# Patient Record
Sex: Female | Born: 1950
Health system: Southern US, Community
[De-identification: ages and names within clinical notes are randomized; demographics above are authoritative.]

## PROBLEM LIST (undated history)

## (undated) DIAGNOSIS — E119 Type 2 diabetes mellitus without complications: Secondary | ICD-10-CM

## (undated) DIAGNOSIS — M549 Dorsalgia, unspecified: Secondary | ICD-10-CM

## (undated) DIAGNOSIS — R609 Edema, unspecified: Secondary | ICD-10-CM

## (undated) DIAGNOSIS — K52832 Lymphocytic colitis: Secondary | ICD-10-CM

## (undated) DIAGNOSIS — R112 Nausea with vomiting, unspecified: Secondary | ICD-10-CM

## (undated) DIAGNOSIS — U071 COVID-19: Secondary | ICD-10-CM

## (undated) DIAGNOSIS — IMO0002 Reserved for concepts with insufficient information to code with codable children: Secondary | ICD-10-CM

## (undated) DIAGNOSIS — R3915 Urgency of urination: Secondary | ICD-10-CM

## (undated) DIAGNOSIS — K219 Gastro-esophageal reflux disease without esophagitis: Secondary | ICD-10-CM

## (undated) DIAGNOSIS — E785 Hyperlipidemia, unspecified: Secondary | ICD-10-CM

## (undated) DIAGNOSIS — R748 Abnormal levels of other serum enzymes: Secondary | ICD-10-CM

## (undated) DIAGNOSIS — R600 Localized edema: Secondary | ICD-10-CM

## (undated) DIAGNOSIS — Z9289 Personal history of other medical treatment: Secondary | ICD-10-CM

## (undated) DIAGNOSIS — J45909 Unspecified asthma, uncomplicated: Secondary | ICD-10-CM

## (undated) DIAGNOSIS — H269 Unspecified cataract: Secondary | ICD-10-CM

## (undated) DIAGNOSIS — N393 Stress incontinence (female) (male): Secondary | ICD-10-CM

## (undated) DIAGNOSIS — K224 Dyskinesia of esophagus: Secondary | ICD-10-CM

## (undated) DIAGNOSIS — I7300 Raynaud's syndrome without gangrene: Secondary | ICD-10-CM

## (undated) DIAGNOSIS — I10 Essential (primary) hypertension: Secondary | ICD-10-CM

## (undated) DIAGNOSIS — J302 Other seasonal allergic rhinitis: Secondary | ICD-10-CM

## (undated) DIAGNOSIS — G629 Polyneuropathy, unspecified: Secondary | ICD-10-CM

## (undated) DIAGNOSIS — Z8709 Personal history of other diseases of the respiratory system: Secondary | ICD-10-CM

## (undated) DIAGNOSIS — G8929 Other chronic pain: Secondary | ICD-10-CM

## (undated) DIAGNOSIS — L4050 Arthropathic psoriasis, unspecified: Secondary | ICD-10-CM

## (undated) DIAGNOSIS — Z8719 Personal history of other diseases of the digestive system: Secondary | ICD-10-CM

## (undated) DIAGNOSIS — Z9889 Other specified postprocedural states: Secondary | ICD-10-CM

## (undated) DIAGNOSIS — M797 Fibromyalgia: Secondary | ICD-10-CM

## (undated) DIAGNOSIS — J310 Chronic rhinitis: Secondary | ICD-10-CM

## (undated) HISTORY — DX: Arthropathic psoriasis, unspecified: L40.50

## (undated) HISTORY — PX: LUMBAR FUSION: SHX111

## (undated) HISTORY — DX: Raynaud's syndrome without gangrene: I73.00

## (undated) HISTORY — DX: Reserved for concepts with insufficient information to code with codable children: IMO0002

## (undated) HISTORY — DX: Essential (primary) hypertension: I10

## (undated) HISTORY — DX: Other seasonal allergic rhinitis: J30.2

## (undated) HISTORY — DX: Lymphocytic colitis: K52.832

## (undated) HISTORY — DX: COVID-19: U07.1

## (undated) HISTORY — DX: Dyskinesia of esophagus: K22.4

## (undated) HISTORY — PX: TONSILLECTOMY AND ADENOIDECTOMY: SHX28

## (undated) HISTORY — DX: Stress incontinence (female) (male): N39.3

## (undated) HISTORY — PX: TONSILLECTOMY: SUR1361

## (undated) HISTORY — DX: Chronic rhinitis: J31.0

## (undated) HISTORY — PX: TUBAL LIGATION: SHX77

## (undated) HISTORY — DX: Gastro-esophageal reflux disease without esophagitis: K21.9

---

## 1975-04-03 HISTORY — PX: DILATION AND CURETTAGE OF UTERUS: SHX78

## 1996-09-30 HISTORY — PX: LUMBAR DISC ARTHROPLASTY: SHX699

## 1997-08-13 ENCOUNTER — Ambulatory Visit (HOSPITAL_COMMUNITY): Admission: RE | Admit: 1997-08-13 | Discharge: 1997-08-13 | Payer: Self-pay | Admitting: Neurosurgery

## 1999-02-15 ENCOUNTER — Other Ambulatory Visit: Admission: RE | Admit: 1999-02-15 | Discharge: 1999-02-15 | Payer: Self-pay | Admitting: *Deleted

## 1999-02-15 ENCOUNTER — Encounter (INDEPENDENT_AMBULATORY_CARE_PROVIDER_SITE_OTHER): Payer: Self-pay

## 1999-08-23 ENCOUNTER — Other Ambulatory Visit: Admission: RE | Admit: 1999-08-23 | Discharge: 1999-08-23 | Payer: Self-pay | Admitting: *Deleted

## 2000-10-17 ENCOUNTER — Ambulatory Visit (HOSPITAL_COMMUNITY): Admission: RE | Admit: 2000-10-17 | Discharge: 2000-10-17 | Payer: Self-pay | Admitting: Neurosurgery

## 2000-10-17 ENCOUNTER — Encounter: Payer: Self-pay | Admitting: Neurosurgery

## 2000-10-24 ENCOUNTER — Encounter: Admission: RE | Admit: 2000-10-24 | Discharge: 2000-11-30 | Payer: Self-pay | Admitting: Anesthesiology

## 2001-01-30 ENCOUNTER — Ambulatory Visit (HOSPITAL_COMMUNITY): Admission: RE | Admit: 2001-01-30 | Discharge: 2001-01-30 | Payer: Self-pay | Admitting: *Deleted

## 2001-01-30 ENCOUNTER — Encounter: Payer: Self-pay | Admitting: *Deleted

## 2001-04-02 HISTORY — PX: BACK SURGERY: SHX140

## 2001-05-06 ENCOUNTER — Ambulatory Visit (HOSPITAL_COMMUNITY): Admission: RE | Admit: 2001-05-06 | Discharge: 2001-05-06 | Payer: Self-pay | Admitting: Orthopedic Surgery

## 2001-05-06 ENCOUNTER — Encounter: Payer: Self-pay | Admitting: Orthopedic Surgery

## 2001-06-13 ENCOUNTER — Encounter: Payer: Self-pay | Admitting: Neurosurgery

## 2001-06-13 ENCOUNTER — Encounter: Admission: RE | Admit: 2001-06-13 | Discharge: 2001-06-13 | Payer: Self-pay | Admitting: Neurosurgery

## 2001-06-27 ENCOUNTER — Encounter: Admission: RE | Admit: 2001-06-27 | Discharge: 2001-06-27 | Payer: Self-pay | Admitting: Neurosurgery

## 2001-06-27 ENCOUNTER — Encounter: Payer: Self-pay | Admitting: Neurosurgery

## 2001-07-11 ENCOUNTER — Encounter: Admission: RE | Admit: 2001-07-11 | Discharge: 2001-07-11 | Payer: Self-pay | Admitting: Neurosurgery

## 2001-07-11 ENCOUNTER — Encounter: Payer: Self-pay | Admitting: Neurosurgery

## 2001-08-01 ENCOUNTER — Encounter: Payer: Self-pay | Admitting: Neurosurgery

## 2001-08-01 ENCOUNTER — Ambulatory Visit (HOSPITAL_COMMUNITY): Admission: RE | Admit: 2001-08-01 | Discharge: 2001-08-01 | Payer: Self-pay | Admitting: Neurosurgery

## 2001-08-27 ENCOUNTER — Other Ambulatory Visit: Admission: RE | Admit: 2001-08-27 | Discharge: 2001-08-27 | Payer: Self-pay | Admitting: *Deleted

## 2001-08-29 ENCOUNTER — Encounter: Payer: Self-pay | Admitting: Neurosurgery

## 2001-08-29 ENCOUNTER — Inpatient Hospital Stay (HOSPITAL_COMMUNITY): Admission: RE | Admit: 2001-08-29 | Discharge: 2001-09-02 | Payer: Self-pay | Admitting: Neurosurgery

## 2002-06-02 ENCOUNTER — Ambulatory Visit (HOSPITAL_COMMUNITY): Admission: RE | Admit: 2002-06-02 | Discharge: 2002-06-02 | Payer: Self-pay | Admitting: *Deleted

## 2002-06-02 ENCOUNTER — Encounter: Payer: Self-pay | Admitting: *Deleted

## 2002-08-14 ENCOUNTER — Encounter: Admission: RE | Admit: 2002-08-14 | Discharge: 2002-08-14 | Payer: Self-pay | Admitting: Neurosurgery

## 2002-08-14 ENCOUNTER — Encounter: Payer: Self-pay | Admitting: Neurosurgery

## 2002-08-14 ENCOUNTER — Encounter: Payer: Self-pay | Admitting: Radiology

## 2002-08-28 ENCOUNTER — Encounter: Payer: Self-pay | Admitting: Neurosurgery

## 2002-08-28 ENCOUNTER — Encounter: Admission: RE | Admit: 2002-08-28 | Discharge: 2002-08-28 | Payer: Self-pay | Admitting: Neurosurgery

## 2002-09-03 ENCOUNTER — Other Ambulatory Visit: Admission: RE | Admit: 2002-09-03 | Discharge: 2002-09-03 | Payer: Self-pay | Admitting: *Deleted

## 2002-09-15 ENCOUNTER — Ambulatory Visit (HOSPITAL_COMMUNITY): Admission: AD | Admit: 2002-09-15 | Discharge: 2002-09-15 | Payer: Self-pay | Admitting: Neurosurgery

## 2002-09-15 ENCOUNTER — Encounter: Payer: Self-pay | Admitting: Neurosurgery

## 2002-10-08 ENCOUNTER — Encounter: Admission: RE | Admit: 2002-10-08 | Discharge: 2002-10-08 | Payer: Self-pay | Admitting: Orthopedic Surgery

## 2002-10-08 ENCOUNTER — Encounter: Payer: Self-pay | Admitting: Orthopedic Surgery

## 2002-12-15 ENCOUNTER — Encounter
Admission: RE | Admit: 2002-12-15 | Discharge: 2003-03-15 | Payer: Self-pay | Admitting: Physical Medicine & Rehabilitation

## 2002-12-24 ENCOUNTER — Encounter
Admission: RE | Admit: 2002-12-24 | Discharge: 2003-03-24 | Payer: Self-pay | Admitting: Physical Medicine & Rehabilitation

## 2003-04-23 ENCOUNTER — Encounter
Admission: RE | Admit: 2003-04-23 | Discharge: 2003-07-22 | Payer: Self-pay | Admitting: Physical Medicine & Rehabilitation

## 2003-06-18 ENCOUNTER — Ambulatory Visit (HOSPITAL_COMMUNITY): Admission: RE | Admit: 2003-06-18 | Discharge: 2003-06-18 | Payer: Self-pay | Admitting: *Deleted

## 2003-09-09 ENCOUNTER — Other Ambulatory Visit: Admission: RE | Admit: 2003-09-09 | Discharge: 2003-09-09 | Payer: Self-pay | Admitting: *Deleted

## 2003-11-11 ENCOUNTER — Encounter
Admission: RE | Admit: 2003-11-11 | Discharge: 2003-12-20 | Payer: Self-pay | Admitting: Physical Medicine & Rehabilitation

## 2003-12-20 ENCOUNTER — Encounter
Admission: RE | Admit: 2003-12-20 | Discharge: 2004-02-18 | Payer: Self-pay | Admitting: Physical Medicine & Rehabilitation

## 2003-12-21 ENCOUNTER — Ambulatory Visit: Payer: Self-pay | Admitting: Physical Medicine & Rehabilitation

## 2003-12-25 ENCOUNTER — Ambulatory Visit (HOSPITAL_COMMUNITY): Admission: RE | Admit: 2003-12-25 | Discharge: 2003-12-25 | Payer: Self-pay | Admitting: Rheumatology

## 2004-03-29 ENCOUNTER — Encounter
Admission: RE | Admit: 2004-03-29 | Discharge: 2004-06-27 | Payer: Self-pay | Admitting: Physical Medicine & Rehabilitation

## 2004-09-05 ENCOUNTER — Ambulatory Visit (HOSPITAL_COMMUNITY): Admission: RE | Admit: 2004-09-05 | Discharge: 2004-09-05 | Payer: Self-pay | Admitting: *Deleted

## 2004-09-14 ENCOUNTER — Encounter: Admission: RE | Admit: 2004-09-14 | Discharge: 2004-09-14 | Payer: Self-pay | Admitting: Obstetrics and Gynecology

## 2004-12-01 ENCOUNTER — Encounter: Admission: RE | Admit: 2004-12-01 | Discharge: 2004-12-01 | Payer: Self-pay | Admitting: *Deleted

## 2005-02-26 ENCOUNTER — Ambulatory Visit (HOSPITAL_COMMUNITY): Admission: RE | Admit: 2005-02-26 | Discharge: 2005-02-26 | Payer: Self-pay | Admitting: Internal Medicine

## 2005-03-07 ENCOUNTER — Ambulatory Visit (HOSPITAL_COMMUNITY): Admission: RE | Admit: 2005-03-07 | Discharge: 2005-03-07 | Payer: Self-pay | Admitting: *Deleted

## 2005-04-04 ENCOUNTER — Ambulatory Visit (HOSPITAL_COMMUNITY): Admission: RE | Admit: 2005-04-04 | Discharge: 2005-04-04 | Payer: Self-pay | Admitting: *Deleted

## 2005-09-05 ENCOUNTER — Ambulatory Visit (HOSPITAL_COMMUNITY): Admission: RE | Admit: 2005-09-05 | Discharge: 2005-09-05 | Payer: Self-pay | Admitting: General Surgery

## 2005-10-05 ENCOUNTER — Other Ambulatory Visit: Admission: RE | Admit: 2005-10-05 | Discharge: 2005-10-05 | Payer: Self-pay | Admitting: Obstetrics & Gynecology

## 2005-12-13 ENCOUNTER — Ambulatory Visit (HOSPITAL_COMMUNITY): Admission: RE | Admit: 2005-12-13 | Discharge: 2005-12-13 | Payer: Self-pay | Admitting: Neurosurgery

## 2005-12-31 ENCOUNTER — Encounter: Admission: RE | Admit: 2005-12-31 | Discharge: 2005-12-31 | Payer: Self-pay | Admitting: Neurosurgery

## 2006-01-21 ENCOUNTER — Encounter: Admission: RE | Admit: 2006-01-21 | Discharge: 2006-01-21 | Payer: Self-pay | Admitting: Neurosurgery

## 2006-03-13 ENCOUNTER — Ambulatory Visit (HOSPITAL_COMMUNITY): Admission: RE | Admit: 2006-03-13 | Discharge: 2006-03-13 | Payer: Self-pay | Admitting: *Deleted

## 2006-05-01 ENCOUNTER — Encounter: Admission: RE | Admit: 2006-05-01 | Discharge: 2006-05-01 | Payer: Self-pay | Admitting: Neurosurgery

## 2006-10-16 ENCOUNTER — Encounter: Admission: RE | Admit: 2006-10-16 | Discharge: 2006-10-16 | Payer: Self-pay | Admitting: Obstetrics & Gynecology

## 2006-10-30 ENCOUNTER — Other Ambulatory Visit: Admission: RE | Admit: 2006-10-30 | Discharge: 2006-10-30 | Payer: Self-pay | Admitting: Obstetrics & Gynecology

## 2006-11-22 ENCOUNTER — Encounter: Admission: RE | Admit: 2006-11-22 | Discharge: 2006-11-22 | Payer: Self-pay | Admitting: Rheumatology

## 2006-12-27 ENCOUNTER — Ambulatory Visit (HOSPITAL_COMMUNITY): Admission: RE | Admit: 2006-12-27 | Discharge: 2006-12-27 | Payer: Self-pay | Admitting: Neurosurgery

## 2007-01-15 ENCOUNTER — Ambulatory Visit (HOSPITAL_COMMUNITY): Payer: Self-pay | Admitting: Internal Medicine

## 2007-01-15 ENCOUNTER — Encounter (HOSPITAL_COMMUNITY): Admission: RE | Admit: 2007-01-15 | Discharge: 2007-02-14 | Payer: Self-pay | Admitting: Oncology

## 2007-02-05 ENCOUNTER — Inpatient Hospital Stay (HOSPITAL_COMMUNITY): Admission: RE | Admit: 2007-02-05 | Discharge: 2007-02-07 | Payer: Self-pay | Admitting: Neurosurgery

## 2007-03-10 ENCOUNTER — Ambulatory Visit (HOSPITAL_COMMUNITY): Admission: RE | Admit: 2007-03-10 | Discharge: 2007-03-10 | Payer: Self-pay | Admitting: Obstetrics & Gynecology

## 2007-04-16 ENCOUNTER — Encounter (HOSPITAL_COMMUNITY): Admission: RE | Admit: 2007-04-16 | Discharge: 2007-05-16 | Payer: Self-pay | Admitting: Internal Medicine

## 2007-04-16 ENCOUNTER — Ambulatory Visit (HOSPITAL_COMMUNITY): Payer: Self-pay | Admitting: Internal Medicine

## 2007-04-25 ENCOUNTER — Ambulatory Visit (HOSPITAL_COMMUNITY): Admission: RE | Admit: 2007-04-25 | Discharge: 2007-04-25 | Payer: Self-pay | Admitting: Internal Medicine

## 2007-07-16 ENCOUNTER — Encounter (HOSPITAL_COMMUNITY): Admission: RE | Admit: 2007-07-16 | Discharge: 2007-08-15 | Payer: Self-pay | Admitting: Internal Medicine

## 2007-07-16 ENCOUNTER — Ambulatory Visit (HOSPITAL_COMMUNITY): Payer: Self-pay | Admitting: Internal Medicine

## 2007-10-15 ENCOUNTER — Encounter (HOSPITAL_COMMUNITY): Admission: RE | Admit: 2007-10-15 | Discharge: 2007-11-14 | Payer: Self-pay | Admitting: Internal Medicine

## 2007-10-15 ENCOUNTER — Ambulatory Visit (HOSPITAL_COMMUNITY): Payer: Self-pay | Admitting: Internal Medicine

## 2007-11-06 ENCOUNTER — Other Ambulatory Visit: Admission: RE | Admit: 2007-11-06 | Discharge: 2007-11-06 | Payer: Self-pay | Admitting: Obstetrics & Gynecology

## 2008-01-14 ENCOUNTER — Ambulatory Visit (HOSPITAL_COMMUNITY): Payer: Self-pay | Admitting: Endocrinology

## 2008-01-14 ENCOUNTER — Encounter (HOSPITAL_COMMUNITY): Admission: RE | Admit: 2008-01-14 | Discharge: 2008-02-13 | Payer: Self-pay | Admitting: Endocrinology

## 2008-03-15 ENCOUNTER — Ambulatory Visit (HOSPITAL_COMMUNITY): Admission: RE | Admit: 2008-03-15 | Discharge: 2008-03-15 | Payer: Self-pay | Admitting: Obstetrics & Gynecology

## 2008-04-20 ENCOUNTER — Encounter (HOSPITAL_COMMUNITY): Admission: RE | Admit: 2008-04-20 | Discharge: 2008-05-20 | Payer: Self-pay | Admitting: Endocrinology

## 2008-04-20 ENCOUNTER — Ambulatory Visit (HOSPITAL_COMMUNITY): Payer: Self-pay | Admitting: Endocrinology

## 2008-05-12 ENCOUNTER — Ambulatory Visit: Payer: Self-pay | Admitting: Gastroenterology

## 2008-05-22 ENCOUNTER — Emergency Department (HOSPITAL_COMMUNITY): Admission: EM | Admit: 2008-05-22 | Discharge: 2008-05-22 | Payer: Self-pay | Admitting: Emergency Medicine

## 2008-06-04 ENCOUNTER — Encounter: Payer: Self-pay | Admitting: Gastroenterology

## 2008-06-18 ENCOUNTER — Encounter: Payer: Self-pay | Admitting: Gastroenterology

## 2008-06-18 ENCOUNTER — Ambulatory Visit (HOSPITAL_COMMUNITY): Admission: RE | Admit: 2008-06-18 | Discharge: 2008-06-18 | Payer: Self-pay | Admitting: Gastroenterology

## 2008-06-18 ENCOUNTER — Ambulatory Visit: Payer: Self-pay | Admitting: Gastroenterology

## 2008-06-19 HISTORY — PX: COLONOSCOPY: SHX5424

## 2008-06-19 HISTORY — PX: ESOPHAGOGASTRODUODENOSCOPY: SHX1529

## 2008-06-22 ENCOUNTER — Encounter: Payer: Self-pay | Admitting: Gastroenterology

## 2008-07-09 ENCOUNTER — Encounter: Admission: RE | Admit: 2008-07-09 | Discharge: 2008-07-09 | Payer: Self-pay | Admitting: Neurosurgery

## 2008-07-14 ENCOUNTER — Encounter: Payer: Self-pay | Admitting: Gastroenterology

## 2008-07-29 ENCOUNTER — Encounter (HOSPITAL_COMMUNITY): Admission: RE | Admit: 2008-07-29 | Discharge: 2008-08-28 | Payer: Self-pay | Admitting: Endocrinology

## 2008-08-02 ENCOUNTER — Ambulatory Visit (HOSPITAL_COMMUNITY): Payer: Self-pay | Admitting: Endocrinology

## 2008-10-29 ENCOUNTER — Encounter: Payer: Self-pay | Admitting: Obstetrics & Gynecology

## 2008-10-29 ENCOUNTER — Encounter: Admission: RE | Admit: 2008-10-29 | Discharge: 2008-10-29 | Payer: Self-pay | Admitting: Obstetrics & Gynecology

## 2008-11-08 ENCOUNTER — Ambulatory Visit (HOSPITAL_COMMUNITY): Payer: Self-pay | Admitting: Endocrinology

## 2008-11-08 ENCOUNTER — Encounter (HOSPITAL_COMMUNITY): Admission: RE | Admit: 2008-11-08 | Discharge: 2008-12-08 | Payer: Self-pay | Admitting: Endocrinology

## 2009-02-07 ENCOUNTER — Ambulatory Visit (HOSPITAL_COMMUNITY): Payer: Self-pay | Admitting: Internal Medicine

## 2009-02-07 ENCOUNTER — Encounter (HOSPITAL_COMMUNITY): Admission: RE | Admit: 2009-02-07 | Discharge: 2009-03-09 | Payer: Self-pay | Admitting: Internal Medicine

## 2009-05-09 ENCOUNTER — Encounter (HOSPITAL_COMMUNITY): Admission: RE | Admit: 2009-05-09 | Discharge: 2009-06-08 | Payer: Self-pay | Admitting: Internal Medicine

## 2009-05-09 ENCOUNTER — Ambulatory Visit (HOSPITAL_COMMUNITY): Payer: Self-pay | Admitting: Internal Medicine

## 2009-08-12 ENCOUNTER — Encounter (HOSPITAL_COMMUNITY): Admission: RE | Admit: 2009-08-12 | Discharge: 2009-09-11 | Payer: Self-pay | Admitting: Internal Medicine

## 2009-08-12 ENCOUNTER — Ambulatory Visit (HOSPITAL_COMMUNITY): Payer: Self-pay | Admitting: Internal Medicine

## 2009-11-11 ENCOUNTER — Encounter (HOSPITAL_COMMUNITY): Admission: RE | Admit: 2009-11-11 | Discharge: 2009-12-11 | Payer: Self-pay | Admitting: Internal Medicine

## 2009-11-21 ENCOUNTER — Ambulatory Visit (HOSPITAL_COMMUNITY): Admission: RE | Admit: 2009-11-21 | Discharge: 2009-11-21 | Payer: Self-pay | Admitting: Obstetrics & Gynecology

## 2009-12-30 ENCOUNTER — Encounter (HOSPITAL_COMMUNITY)
Admission: RE | Admit: 2009-12-30 | Discharge: 2010-03-30 | Payer: Self-pay | Source: Home / Self Care | Attending: Rheumatology | Admitting: Rheumatology

## 2010-02-06 ENCOUNTER — Emergency Department (HOSPITAL_COMMUNITY): Admission: EM | Admit: 2010-02-06 | Discharge: 2010-02-06 | Payer: Self-pay | Admitting: Emergency Medicine

## 2010-02-08 ENCOUNTER — Encounter (HOSPITAL_COMMUNITY)
Admission: RE | Admit: 2010-02-08 | Discharge: 2010-03-10 | Payer: Self-pay | Source: Home / Self Care | Attending: Endocrinology | Admitting: Endocrinology

## 2010-02-08 ENCOUNTER — Ambulatory Visit (HOSPITAL_COMMUNITY): Payer: Self-pay | Admitting: Endocrinology

## 2010-04-02 HISTORY — PX: COLONOSCOPY: SHX174

## 2010-04-22 ENCOUNTER — Encounter: Payer: Self-pay | Admitting: Family Medicine

## 2010-04-23 ENCOUNTER — Encounter: Payer: Self-pay | Admitting: *Deleted

## 2010-04-23 ENCOUNTER — Encounter: Payer: Self-pay | Admitting: Obstetrics & Gynecology

## 2010-04-23 ENCOUNTER — Encounter: Payer: Self-pay | Admitting: Neurosurgery

## 2010-04-24 ENCOUNTER — Encounter: Payer: Self-pay | Admitting: Neurosurgery

## 2010-04-24 ENCOUNTER — Encounter: Payer: Self-pay | Admitting: Obstetrics & Gynecology

## 2010-05-03 ENCOUNTER — Ambulatory Visit (HOSPITAL_COMMUNITY): Payer: 59 | Attending: Rheumatology

## 2010-05-03 DIAGNOSIS — K5090 Crohn's disease, unspecified, without complications: Secondary | ICD-10-CM | POA: Insufficient documentation

## 2010-05-04 NOTE — Consult Note (Signed)
Summary: Consultation Report  Consultation Report   Imported By: Diana Eves 06/04/2008 16:20:47  _____________________________________________________________________  External Attachment:    Type:   Image     Comment:   External Document

## 2010-05-04 NOTE — Letter (Signed)
Summary: ORDER FOR EGD  ORDER FOR EGD   Imported By: Diana Eves 06/22/2008 10:18:59  _____________________________________________________________________  External Attachment:    Type:   Image     Comment:   External Document

## 2010-05-04 NOTE — Letter (Signed)
Summary: REFERRAL  REFERRAL   Imported By: Diana Eves 07/14/2008 10:09:15  _____________________________________________________________________  External Attachment:    Type:   Image     Comment:   External Document

## 2010-05-05 ENCOUNTER — Ambulatory Visit (INDEPENDENT_AMBULATORY_CARE_PROVIDER_SITE_OTHER): Payer: Commercial Managed Care - PPO | Admitting: Urgent Care

## 2010-05-05 ENCOUNTER — Encounter: Payer: Self-pay | Admitting: *Deleted

## 2010-05-05 DIAGNOSIS — D539 Nutritional anemia, unspecified: Secondary | ICD-10-CM | POA: Insufficient documentation

## 2010-05-05 DIAGNOSIS — K219 Gastro-esophageal reflux disease without esophagitis: Secondary | ICD-10-CM | POA: Insufficient documentation

## 2010-05-05 DIAGNOSIS — R197 Diarrhea, unspecified: Secondary | ICD-10-CM | POA: Insufficient documentation

## 2010-05-08 ENCOUNTER — Encounter: Payer: Self-pay | Admitting: Urgent Care

## 2010-05-09 ENCOUNTER — Encounter: Payer: Self-pay | Admitting: Urgent Care

## 2010-05-10 ENCOUNTER — Ambulatory Visit (HOSPITAL_COMMUNITY): Payer: Self-pay

## 2010-05-10 NOTE — Assessment & Plan Note (Signed)
Summary: diarrhea   Vital Signs:  Patient profile:   60 year old female Height:      59.5 inches Weight:      135 pounds BMI:     26.91 Temp:     98.1 degrees F oral Pulse rate:   72 / minute BP sitting:   120 / 76  (left arm) Cuff size:   regular  Vitals Entered By: Hendricks Limes LPN (May 05, 2010 11:00 AM)  Visit Type:  Follow-up Consult Primary Care Provider:  Dr. Ouida Sills  Chief Complaint:  diarrhea.  History of Present Illness: cc:Dr. 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  Started Nexium  last year.  Now on once daily dosage.  Not sure if this is contributing to diarrhea.  Normal BM q Am, occ two times a day.  Started Remicade 9/11, about 2 weeks later developed diarrhea, day after 2nd dose.  Was on Remicade IV q2wks, then q 4 wks, now on 8 wk dose.  Had loose liquid stool, then 3 more that day x 3 days.  BM 5-6 per day.  Amoxicillin in Dec for area on inner thigh ?MRSA.    Then was on flagyl & area on leg went away.   Denies abd pain.  Denies nausea or vomiting.  Denies fever or chills.  c/o tenesmus.   Tried pepto-no help.  Imodium x2 w/ helps x 1-2 days.  Denies constipation, mucous or rectal bleeding.  Wt stable.  Appetite decreased, eating bland foods.  On prednisone 5mg  daily.  Cymbalta also new.  Decreased normal flora.  Labs Nov 7/11-CMP normal x glucose 113.  WBC normal, Hgb 11.6, ANC 8200.  stool cx and c diff negative at that time.    Current Problems (verified): 1)  Diarrhea  (ICD-787.91)  Current Medications (verified): 1)  Prednisone 5 Mg Tabs (Prednisone) .... Once Daily 2)  Flexeril 5 Mg Tabs (Cyclobenzaprine Hcl) .Marland Kitchen.. 1 Q Am, 2 Q Pm Up To Three Times A Day 3)  Metoprolol Tartrate 50 Mg Tabs (Metoprolol Tartrate) .... Once Daily 4)  Spironolactone 25 Mg Tabs (Spironolactone) .... Once Daily 5)  Nexium 20 Mg Cpdr (Esomeprazole Magnesium) .... Once Daily 6)  Arava 20 Mg Tabs (Leflunomide) ....  Once Daily 7)  Boniva 3 Mg/21ml Soln (Ibandronate Sodium) .... Q 3 Months 8)  Remicade 100 Mg Solr (Infliximab) .... Last Dose 05/03/10 9)  Fish Oil 1000 Mg Caps (Omega-3 Fatty Acids) .... Once Daily 10)  Glucosamine 500 Mg Caps (Glucosamine Sulfate) .... Once Daily 11)  Calcium 500 Mg Tabs (Calcium) .... Once Daily 12)  Multivitamins  Caps (Multiple Vitamin) .... Once Daily 13)  Aspirin 81 Mg Tbec (Aspirin) .... Once Daily 14)  Potassium 20 Meq .... Once Daily 15)  Tumeric 1000 Mg .... Once Daily 16)  Ginger .... Once Daily 17)  Vitamin D 2000mg  .... Once Daily 18)  Biotin .... Once Daily 19)  Fiber Choice .... Once Daily 20)  Cymbalta 60 Mg Cpep (Duloxetine Hcl) .... Once Daily  Allergies (verified): 1)  ! Methotrexate 2)  ! Humira (Adalimumab) 3)  ! Morphine 4)  ! Sulfa  Past History:  Past Medical History: 3/10 TCS->Tortuous colon, Rare sigmoid colon diverticula, Small internal hemorrhoid Genella Rife, last EGD-> Normal esophagus, gastritis  Hypertension seasonal allergies   psoriatic arthritis  degenerative disk disease, neuropathy  impaired fasting glucose hypokalemia, osteoporosis Raynaud  Past Surgical History: She has had 2 C-sections, 1 D and C, a miscarriage, or a   spontaneous abortion, had tonsillectomy.  Tonsils and adenoids removed.   A diskectomy and L3 and L4 fusion and L4 and L5 fusion.   Family History:  Mother is alive with history of rheumatoid arthritis,   degenerative disk disease, diabetes mellitus, and empty sella syndrome,   and fibromyalgia.  Father deceased at 19 secondary to rheumatic fever   and endocarditis.  She has 2 sisters, 1 with fibromyalgia, 1 healthy   brother.   Social History: Married 2  grown healthy children.   She resides in [place].  She is a full-time   Scientist, physiological at [employer].  She denies any tobacco, alcohol, or drug use.   Review of Systems General:  Complains of fatigue, weakness, and malaise; denies  fever, chills, sweats, anorexia, weight loss, and sleep disorder. CV:  Denies chest pains, angina, palpitations, syncope, dyspnea on exertion, orthopnea, PND, peripheral edema, and claudication. Resp:  Denies dyspnea at rest, dyspnea with exercise, cough, sputum, wheezing, coughing up blood, and pleurisy. GI:  See HPI; Denies difficulty swallowing, pain on swallowing, jaundice, bloody BM's, black BMs, and fecal incontinence. GU:  Denies urinary burning, blood in urine, nocturnal urination, urinary frequency, urinary incontinence, and abnormal vaginal bleeding. MS:  Complains of joint pain / LOM, joint swelling, joint stiffness, and joint deformity. Derm:  Complains of rash; denies itching, dry skin, hives, moles, warts, and unhealing ulcers; see HPI. Psych:  Denies depression, anxiety, memory loss, suicidal ideation, hallucinations, paranoia, phobia, and confusion. Heme:  Denies bruising, bleeding, and enlarged lymph nodes.  Physical Exam  General:  Well developed, well nourished, no acute distress. Head:  Normocephalic and atraumatic. Eyes:  Sclera clear, no icterus. Ears:  Normal auditory acuity. Mouth:  No deformity or lesions, dentition normal. Neck:  Supple; no masses or thyromegaly.  Slight protrusion soft tissue left suprascapular area, no discrete mass Heart:  Regular rate and rhythm; no murmurs, rubs,  or bruits. Abdomen:  Soft, nontender and nondistended. No masses, hepatosplenomegaly or hernias noted. Normal bowel sounds.without guarding and without rebound.   Msk:  Symmetrical with no gross deformities. Normal posture. Extremities:  Changes chronic arthritis w/ joint deformity & swelling in hands Neurologic:  Alert and  oriented x4;  grossly normal neurologically. Skin:  Intact without significant lesions or rashes. Cervical Nodes:  No significant cervical adenopathy. Psych:  Alert and cooperative. Normal mood and affect.   Impression & Recommendations:  Problem # 1:   DIARRHEA (ICD-787.91) 60 y/o caucasian female w/ persistent diarrhea.  She has had new meds Cymbalta &  Remicade, as well as recent antibiotics.  Differentials include infectious etiology given immunosuppression, c diff, PMC, or CMV, microscopic colitis, new medication side effect, or post-infectious IBS.  Will repeat c diff & obtain labs to help sort this out.  If no improvement w/ probiotics and change in PPI, may need flex sigmoidoscopy w/ random biosies. Orders T-C diff by PCR ([id number]) T-CBC w/Diff ([id number]) T-Comprehensive Metabolic Panel ([id number])  Problem # 2:  GERD (ICD-530.81)  Occ breakthrough on daily Nexium.  ?culprit of diarrhea  Orders: Est. Patient Level IV (42706)  Problem # 3:  ANEMIA, CHRONIC (ICD-281.9) Pt gives hx of anemia.  No gross GI bleeding.  This may need to be evaluated further w/ anemia panel pending CBC.   Orders Added: 1)  T-C diff by PCR [81755] 2)  T-CBC w/Diff [[id number]] 3)  T-Comprehensive  Metabolic Panel [[id number]] 4)  Est. Patient Level IV [45409]  Appended Document: diarrhea Trial Align once daily Stop Nexium Begin Aciphex 20 mg daily 1 mo sample given + rebate card

## 2010-05-12 ENCOUNTER — Encounter: Payer: Self-pay | Admitting: Internal Medicine

## 2010-05-12 LAB — CONVERTED CEMR LAB
ALT: 22 units/L (ref 0–35)
AST: 20 units/L (ref 0–37)
Albumin: 4.1 g/dL (ref 3.5–5.2)
Alkaline Phosphatase: 34 units/L — ABNORMAL LOW (ref 39–117)
BUN: 20 mg/dL (ref 6–23)
Basophils Absolute: 0.1 10*3/uL (ref 0.0–0.1)
Basophils Relative: 1 % (ref 0–1)
CO2: 21 meq/L (ref 19–32)
Calcium: 9.4 mg/dL (ref 8.4–10.5)
Chloride: 106 meq/L (ref 96–112)
Creatinine, Ser: 0.89 mg/dL (ref 0.40–1.20)
Eosinophils Absolute: 0.3 10*3/uL (ref 0.0–0.7)
Eosinophils Relative: 4 % (ref 0–5)
Glucose, Bld: 90 mg/dL (ref 70–99)
HCT: 37.5 % (ref 36.0–46.0)
Hemoglobin: 12.3 g/dL (ref 12.0–15.0)
Lymphocytes Relative: 22 % (ref 12–46)
Lymphs Abs: 1.9 10*3/uL (ref 0.7–4.0)
MCHC: 32.8 g/dL (ref 30.0–36.0)
MCV: 95.7 fL (ref 78.0–100.0)
Monocytes Absolute: 0.6 10*3/uL (ref 0.1–1.0)
Monocytes Relative: 7 % (ref 3–12)
Neutro Abs: 5.9 10*3/uL (ref 1.7–7.7)
Neutrophils Relative %: 67 % (ref 43–77)
Platelets: 241 10*3/uL (ref 150–400)
Potassium: 3.9 meq/L (ref 3.5–5.3)
RBC: 3.92 M/uL (ref 3.87–5.11)
RDW: 13.6 % (ref 11.5–15.5)
Sodium: 138 meq/L (ref 135–145)
Total Bilirubin: 0.3 mg/dL (ref 0.3–1.2)
Total Protein: 6.7 g/dL (ref 6.0–8.3)
WBC: 8.8 10*3/uL (ref 4.0–10.5)

## 2010-05-18 NOTE — Letter (Signed)
Summary: FLEX SIG ORDER  FLEX SIG ORDER   Imported By: Ave Filter 05/12/2010 11:07:31  _____________________________________________________________________  External Attachment:    Type:   Image     Comment:   External Document

## 2010-05-24 ENCOUNTER — Ambulatory Visit (HOSPITAL_COMMUNITY): Payer: Commercial Managed Care - PPO

## 2010-05-24 ENCOUNTER — Encounter (HOSPITAL_COMMUNITY): Payer: Commercial Managed Care - PPO | Attending: Internal Medicine

## 2010-05-24 DIAGNOSIS — M810 Age-related osteoporosis without current pathological fracture: Secondary | ICD-10-CM | POA: Insufficient documentation

## 2010-05-26 ENCOUNTER — Ambulatory Visit (HOSPITAL_COMMUNITY)
Admission: RE | Admit: 2010-05-26 | Discharge: 2010-05-26 | Disposition: A | Discharge status: Home / Self Care | Payer: Commercial Managed Care - PPO | Source: Ambulatory Visit | Attending: Internal Medicine | Admitting: Internal Medicine

## 2010-05-26 ENCOUNTER — Other Ambulatory Visit: Payer: Commercial Managed Care - PPO | Admitting: Internal Medicine

## 2010-05-26 ENCOUNTER — Other Ambulatory Visit: Payer: Self-pay | Admitting: Internal Medicine

## 2010-05-26 DIAGNOSIS — Z79899 Other long term (current) drug therapy: Secondary | ICD-10-CM | POA: Insufficient documentation

## 2010-05-26 DIAGNOSIS — I10 Essential (primary) hypertension: Secondary | ICD-10-CM | POA: Insufficient documentation

## 2010-05-26 DIAGNOSIS — Z7982 Long term (current) use of aspirin: Secondary | ICD-10-CM | POA: Insufficient documentation

## 2010-05-26 DIAGNOSIS — K52832 Lymphocytic colitis: Secondary | ICD-10-CM

## 2010-05-26 DIAGNOSIS — K5730 Diverticulosis of large intestine without perforation or abscess without bleeding: Secondary | ICD-10-CM | POA: Insufficient documentation

## 2010-05-26 DIAGNOSIS — R197 Diarrhea, unspecified: Secondary | ICD-10-CM | POA: Insufficient documentation

## 2010-05-26 DIAGNOSIS — IMO0002 Reserved for concepts with insufficient information to code with codable children: Secondary | ICD-10-CM | POA: Insufficient documentation

## 2010-05-26 HISTORY — DX: Lymphocytic colitis: K52.832

## 2010-05-26 LAB — CLOSTRIDIUM DIFFICILE BY PCR: Toxigenic C. Difficile by PCR: NEGATIVE

## 2010-05-27 LAB — FECAL LACTOFERRIN, QUANT: Fecal Lactoferrin: POSITIVE

## 2010-05-29 LAB — OVA AND PARASITE EXAMINATION: Ova and parasites: NONE SEEN

## 2010-05-30 LAB — STOOL CULTURE

## 2010-06-07 NOTE — Op Note (Addendum)
  NAMEJAYDN, Tonya Hall               ACCOUNT NO.:  [id number]  MEDICAL RECORD NO.:  [id number]           PATIENT TYPE:  O  LOCATION:  DAYP                          FACILITY:  [facility]  PHYSICIAN:  R. Roetta Sessions, M.D. DATE OF BIRTH:  01/05/1951  DATE OF PROCEDURE:  05/26/2010 DATE OF DISCHARGE:                              OPERATIVE REPORT   INDICATIONS FOR PROCEDURE:  A 60 year old lady with a 20-month history of chronic nonbloody diarrhea.  She is on multiple medications.  C. diff PCR came back negative recently.  Stool culture also negative.  Last colonoscopy was done in March 2010, which she was found to have only diverticulosis and small hemorrhoids.  Sigmoidoscopy is now being done to further investigative cause of diarrhea.  Risks, benefits, limitations, alternatives, and imponderables have been discussed, questions answered.  Please see the documentation in the medical record.  PROCEDURE NOTE:  O2 saturation, blood pressure, pulse and respirations were monitored throughout the entire procedure.  CONSCIOUS SEDATION: 1. Versed 3 mg IV. 2. Demerol 75 mg IV in divided doses.  INSTRUMENT:  Pentax video chip system.  FINDINGS:  Digital rectal exam revealed no abnormalities.  Endoscopic findings, prep consistent with the enema prep only.  She received examination of the rectal mucosa including retroflexed view of the anal verge demonstrated no mucosal abnormalities.  Colon:  Colonic mucosa was surveyed from the rectosigmoid junction, was felt to be well into the mid descending colon.  Scope was advanced nice one-to-one fashion, some formed a semi-formed stool through this segment.  Prep really tapered off by the midst. Descending was reached and further advancement was not made.  The patient was noted to have sigmoid descending diverticula. However, the colonic mucosa was seen.  Otherwise, appeared entirely normal.  Several biopsies of the descending sigmoid and rectal  mucosa were taken for histologic study.  Also, stool residue was suctioned out for repeat stool studies.  The patient tolerated the procedure well and was reactive to the endoscopy.  IMPRESSION: 1. Normal-appearing rectum, status post biopsy. 2. Sigmoid descending diverticula, otherwise normal-appearing colonic     mucosa to approximately the mid sigmoid level, status post     segmental biopsy.  RECOMMENDATIONS: 1. To follow up on stool and biopsies. 2. Diverticulosis literature provided to [name]. 3. Further recommendations to follow.     Jonathon Bellows, M.D.     RMR/MEDQ  D:  05/26/2010  T:  05/26/2010  Job:  [id number]  cc:   Kingsley Callander. Ouida Sills, MD Fax: [phone]  Pollyann Savoy, M.D. Fax: [phone]  Dr. Corliss Skains  Electronically Signed by Lorrin Goodell M.D. on 06/06/2010 02:41:11 PM Electronically Signed by Lorrin Goodell M.D. on 06/06/2010 03:17:31 PM Electronically Signed by Lorrin Goodell M.D. on 06/06/2010 03:42:17 PM Electronically Signed by Lorrin Goodell M.D. on 06/06/2010 04:18:58 PM Electronically Signed by Lorrin Goodell M.D. on 06/06/2010 04:50:07 PM Electronically Signed by Lorrin Goodell M.D. on 06/06/2010 04:50:07 PM Electronically Signed by Lorrin Goodell M.D. on 06/06/2010 07:36:57 PM

## 2010-06-13 LAB — GLUCOSE, CAPILLARY: Glucose-Capillary: 100 mg/dL — ABNORMAL HIGH (ref 70–99)

## 2010-06-13 LAB — DIFFERENTIAL
Basophils Absolute: 0 10*3/uL (ref 0.0–0.1)
Basophils Relative: 0 % (ref 0–1)
Eosinophils Absolute: 0.4 10*3/uL (ref 0.0–0.7)
Eosinophils Relative: 4 % (ref 0–5)
Lymphocytes Relative: 12 % (ref 12–46)
Lymphs Abs: 1.3 10*3/uL (ref 0.7–4.0)
Monocytes Absolute: 0.4 10*3/uL (ref 0.1–1.0)
Monocytes Relative: 4 % (ref 3–12)
Neutro Abs: 8.2 10*3/uL — ABNORMAL HIGH (ref 1.7–7.7)
Neutrophils Relative %: 79 % — ABNORMAL HIGH (ref 43–77)

## 2010-06-13 LAB — CBC
HCT: 34.7 % — ABNORMAL LOW (ref 36.0–46.0)
Hemoglobin: 11.6 g/dL — ABNORMAL LOW (ref 12.0–15.0)
MCH: 32.4 pg (ref 26.0–34.0)
MCHC: 33.5 g/dL (ref 30.0–36.0)
MCV: 96.5 fL (ref 78.0–100.0)
Platelets: 185 10*3/uL (ref 150–400)
RBC: 3.59 MIL/uL — ABNORMAL LOW (ref 3.87–5.11)
RDW: 13.2 % (ref 11.5–15.5)
WBC: 10.4 10*3/uL (ref 4.0–10.5)

## 2010-06-13 LAB — CLOSTRIDIUM DIFFICILE BY PCR: Toxigenic C. Difficile by PCR: NOT DETECTED

## 2010-06-13 LAB — COMPREHENSIVE METABOLIC PANEL
ALT: 24 U/L (ref 0–35)
AST: 22 U/L (ref 0–37)
Albumin: 3.9 g/dL (ref 3.5–5.2)
Alkaline Phosphatase: 37 U/L — ABNORMAL LOW (ref 39–117)
BUN: 10 mg/dL (ref 6–23)
CO2: 23 mEq/L (ref 19–32)
Calcium: 9.4 mg/dL (ref 8.4–10.5)
Chloride: 108 mEq/L (ref 96–112)
Creatinine, Ser: 0.98 mg/dL (ref 0.4–1.2)
GFR calc Af Amer: >60 mL/min (ref >60)
GFR calc non Af Amer: 58 mL/min — ABNORMAL LOW (ref >60)
Glucose, Bld: 113 mg/dL — ABNORMAL HIGH (ref 70–99)
Potassium: 3.6 mEq/L (ref 3.5–5.1)
Sodium: 138 mEq/L (ref 135–145)
Total Bilirubin: 0.4 mg/dL (ref 0.3–1.2)
Total Protein: 6.7 g/dL (ref 6.0–8.3)

## 2010-06-13 LAB — STOOL CULTURE

## 2010-06-28 ENCOUNTER — Encounter (HOSPITAL_COMMUNITY): Payer: Commercial Managed Care - PPO

## 2010-06-28 ENCOUNTER — Other Ambulatory Visit: Payer: Self-pay

## 2010-06-28 NOTE — Telephone Encounter (Signed)
Pharmacy? Pt needs OV in 3 weeks EA:[id number] colitis

## 2010-06-29 NOTE — Telephone Encounter (Signed)
I have LMOM on house phone and cell phone for pt to call and set up OV in 3 weeks

## 2010-06-29 NOTE — Telephone Encounter (Signed)
Called Rx to [place] @ [facility]. Left VM for pt it has been called in and that she will need an appointment in 3 weeks.

## 2010-06-30 MED ORDER — BUDESONIDE 3 MG PO CP24: 6.0000 mg | ORAL_CAPSULE | Freq: Every day | ORAL | Status: DC

## 2010-07-05 ENCOUNTER — Encounter (HOSPITAL_COMMUNITY): Payer: Commercial Managed Care - PPO

## 2010-07-05 ENCOUNTER — Encounter (HOSPITAL_COMMUNITY): Payer: Commercial Managed Care - PPO | Attending: Rheumatology

## 2010-07-05 DIAGNOSIS — M810 Age-related osteoporosis without current pathological fracture: Secondary | ICD-10-CM | POA: Insufficient documentation

## 2010-07-18 ENCOUNTER — Encounter: Payer: Self-pay | Admitting: Urgent Care

## 2010-07-18 ENCOUNTER — Ambulatory Visit (INDEPENDENT_AMBULATORY_CARE_PROVIDER_SITE_OTHER): Payer: Commercial Managed Care - PPO | Admitting: Urgent Care

## 2010-07-18 VITALS — BP 123/68 | HR 69 | Temp 97.4°F | Ht 59.5 in | Wt 137.2 lb

## 2010-07-18 DIAGNOSIS — K52832 Lymphocytic colitis: Secondary | ICD-10-CM

## 2010-07-18 DIAGNOSIS — K5289 Other specified noninfective gastroenteritis and colitis: Secondary | ICD-10-CM

## 2010-07-18 LAB — POCT CARDIAC MARKERS
CKMB, poc: <1 ng/mL — ABNORMAL LOW (ref 1.0–8.0)
CKMB, poc: <1 ng/mL — ABNORMAL LOW (ref 1.0–8.0)
Myoglobin, poc: 45.4 ng/mL (ref 12–200)
Myoglobin, poc: 63.2 ng/mL (ref 12–200)
Troponin i, poc: <0.05 ng/mL (ref 0.00–0.09)
Troponin i, poc: <0.05 ng/mL (ref 0.00–0.09)

## 2010-07-18 LAB — BASIC METABOLIC PANEL
BUN: 15 mg/dL (ref 6–23)
CO2: 29 mEq/L (ref 19–32)
Calcium: 9.9 mg/dL (ref 8.4–10.5)
Chloride: 98 mEq/L (ref 96–112)
Creatinine, Ser: 0.74 mg/dL (ref 0.4–1.2)
GFR calc Af Amer: >60 mL/min (ref >60)
GFR calc non Af Amer: >60 mL/min (ref >60)
Glucose, Bld: 118 mg/dL — ABNORMAL HIGH (ref 70–99)
Potassium: 3 mEq/L — ABNORMAL LOW (ref 3.5–5.1)
Sodium: 138 mEq/L (ref 135–145)

## 2010-07-18 LAB — CBC
HCT: 38 % (ref 36.0–46.0)
Hemoglobin: 13 g/dL (ref 12.0–15.0)
MCHC: 34.1 g/dL (ref 30.0–36.0)
MCV: 95.1 fL (ref 78.0–100.0)
Platelets: 218 10*3/uL (ref 150–400)
RBC: 4 MIL/uL (ref 3.87–5.11)
RDW: 13.3 % (ref 11.5–15.5)
WBC: 9.6 10*3/uL (ref 4.0–10.5)

## 2010-07-18 MED ORDER — BUDESONIDE 3 MG PO CP24: 3.0000 mg | ORAL_CAPSULE | Freq: Every day | ORAL | Status: DC

## 2010-07-18 NOTE — Patient Instructions (Addendum)
Decrease Entocort 3mg  to 1 per day Call if diarrhea returns or any new problems

## 2010-07-18 NOTE — Progress Notes (Signed)
Please print & send patient handout below via interoffice mail @ [facility]  [email or web address]  Thanks

## 2010-07-18 NOTE — Assessment & Plan Note (Signed)
Ms. Bisaillon is doing much better on entocort 6mg  currently.  Discussed nature & course of microscopic colitis.  Will taper entocort to 3mg  daily x 4 more weeks if she can tolerate.  She remains on low dose prednisone & remicade infusions.  Hopefully, she may be able to be managed with prn imodium & entocort if she haas recurrent "flares".    Call w/ Progress report in 3 weeks or sooner if needed.

## 2010-07-18 NOTE — Progress Notes (Signed)
Primary Care Physician:  Carylon Perches, MD  Chief Complaint  Patient presents with  . Follow-up    microscopic colitis    HPI:  Tonya Hall is a 60 y.o. female here for follow up for microscopic colitis.  BM much better, normal, semi-formed to formed, 1-3 per day.  Denies abd pain.  Denies rectal bleeding or melena.   Wt stable.  Occ indigestion.  On nexium 40mg  daily.  On entocort 6mg  daily. Overall doing much better.  Has received remicade infusion within past few wks for arthritis & doing well.  Past Medical History  Diagnosis Date  . Lymphocytic colitis 05/26/10    colonoscopy Dr Jena Gauss  . Psoriatic arthritis     Dr. Anthonette Legato  . Degenerative disk disease   . HTN (hypertension)   . Raynaud's disease   . Seasonal allergies   . GERD (gastroesophageal reflux disease)   . Osteoporosis     Past Surgical History  Procedure Date  . Cesarean section     2  . Tonsillectomy and adenoidectomy   . Lumbar disc surgery   . Lumbar fusion     Current Outpatient Prescriptions  Medication Sig Dispense Refill  . aspirin 81 MG tablet Take 81 mg by mouth daily.        Marland Kitchen BIOTIN FORTE PO Take 1 tablet by mouth daily.        . budesonide (ENTOCORT EC) 3 MG 24 hr capsule Take 1 capsule (3 mg total) by mouth daily.  31 capsule  0  . calcium carbonate (OS-CAL) 600 MG TABS Take 600 mg by mouth 2 (two) times daily with a meal.        . Cholecalciferol (VITAMIN D) 2000 UNITS CAPS Take 1 capsule by mouth daily.        . cyclobenzaprine (FLEXERIL) 5 MG tablet Take 5 mg by mouth 3 (three) times daily as needed.        Marland Kitchen esomeprazole (NEXIUM) 20 MG capsule Take 20 mg by mouth daily before breakfast.        . fish oil-omega-3 fatty acids 1000 MG capsule Take 2 g by mouth daily.        . Ginger, Zingiber officinalis, (GINGER ROOT) 500 MG CAPS Take 1 capsule by mouth daily.        . Glucosamine 500 MG TABS Take 4 tablets by mouth daily.        Marland Kitchen ibandronate (BONIVA) 3 MG/3ML SOLN injection Inject 3 mg into  the vein every 3 (three) months.        . InFLIXimab (REMICADE IV) Inject 3 mg/kg into the vein every 8 (eight) weeks.        Marland Kitchen leflunomide (ARAVA) 20 MG tablet Take 20 mg by mouth daily.        . metoprolol succinate (TOPROL-XL) 25 MG 24 hr tablet Take 25 mg by mouth daily.        . Multiple Vitamin (MULTIVITAMIN) tablet Take 1 tablet by mouth daily.        . Nutritional Supplements (CHOICE DM FIBER-BURST PO) Take 2 capsules by mouth daily.        . potassium chloride (KLOR-CON) 20 MEQ packet Take 20 mEq by mouth daily.        . predniSONE (DELTASONE) 5 MG tablet Take 5 mg by mouth daily.        . simvastatin (ZOCOR) 40 MG tablet Take 20 mg by mouth at bedtime.        Marland Kitchen  spironolactone (ALDACTONE) 25 MG tablet Take 25 mg by mouth daily.        . Turmeric 500 MG CAPS Take 1 capsule by mouth daily.        Marland Kitchen DISCONTD: budesonide (ENTOCORT EC) 3 MG 24 hr capsule Take 2 capsules (6 mg total) by mouth daily.  60 capsule  0    Allergies as of 07/18/2010 - Review Complete 07/18/2010  Allergen Reaction Noted  . Adalimumab    . Methotrexate    . Morphine    . Sulfonamide derivatives      Review of Systems: Gen: Denies any fever, chills, sweats, anorexia, fatigue, weakness, malaise, weight loss, and sleep disorder CV: Denies chest pain, angina, palpitations, syncope, orthopnea, PND, peripheral edema, and claudication. Resp: Denies dyspnea at rest, dyspnea with exercise, cough, sputum, wheezing, coughing up blood, and pleurisy. GI: Denies vomiting blood, jaundice, and fecal incontinence.   Denies dysphagia or odynophagia. Derm: Denies rash, itching, dry skin, hives, moles, warts, or unhealing ulcers.  Psych: Denies depression, anxiety, memory loss, suicidal ideation, hallucinations, paranoia, and confusion. Heme: Denies bruising, bleeding, and enlarged lymph nodes.  Physical Exam: BP 123/68  Pulse 69  Temp 97.4 F (36.3 C)  Ht 4' 11.5" (1.511 m)  Wt 137 lb 3.2 oz (62.234 kg)  BMI 27.25  kg/m2 General:   Alert,  Well-developed, well-nourished, pleasant and cooperative in NAD Head:  Normocephalic and atraumatic. Eyes:  Sclera clear, no icterus.   Conjunctiva pink. Mouth:  No deformity or lesions, dentition normal. Neck:  Supple; no masses or thyromegaly. Heart:  Regular rate and rhythm; no murmurs, clicks, rubs,  or gallops. Abdomen:  Soft, nontender and nondistended. No masses, hepatosplenomegaly or hernias noted. Normal bowel sounds, without guarding, and without rebound.   Msk:  Symmetrical with changes of chronic arthritis. Normal posture. Pulses:  Normal pulses noted. Extremities:  Without clubbing or edema. Neurologic:  Alert and  oriented x4;  grossly normal neurologically. Skin:  Intact without significant lesions or rashes. Cervical Nodes:  No significant cervical adenopathy. Psych:  Alert and cooperative. Normal mood and affect.

## 2010-07-19 NOTE — Progress Notes (Signed)
Printed information and sent to pt at [facility].

## 2010-07-25 ENCOUNTER — Encounter: Payer: Self-pay | Admitting: Urgent Care

## 2010-07-25 ENCOUNTER — Encounter: Payer: Self-pay | Admitting: Gastroenterology

## 2010-07-25 NOTE — Progress Notes (Signed)
Pt is aware of OV on 08/23/10 @ 10 am with [facility]

## 2010-07-25 NOTE — Progress Notes (Deleted)
  Subjective:    Patient ID: Tonya Hall, female    DOB: Dec 19, 1950, 60 y.o.   MRN: [id number]  HPI    Review of Systems     Objective:   Physical Exam        Assessment & Plan:

## 2010-07-25 NOTE — Progress Notes (Unsigned)
Pt stopped me at [facility] to tell me she was having diarrhea again w/ taper to 3mg  Entocort. Discussed w/ Dr. Darrick Penna Continue w/ Entocort 6mg  daily x 4 more weeks OV w/ SLF 4 weeks (30 min appt)

## 2010-08-15 NOTE — Op Note (Signed)
Tonya Hall, Tonya Hall               ACCOUNT NO.:  [id number]   MEDICAL RECORD NO.:  [id number]          PATIENT TYPE:  INP   LOCATION:  3014                         FACILITY:  [facility]   PHYSICIAN:  Hilda Lias, M.D.   DATE OF BIRTH:  1950-09-04   DATE OF PROCEDURE:  02/05/2007  DATE OF DISCHARGE:                               OPERATIVE REPORT   PREOPERATIVE DIAGNOSIS:  Degenerative disk disease L2-L3 with chronic  radiculopathy status post 3-4 fusion with __________  left 3-4.   POSTOPERATIVE DIAGNOSES:  Degenerative disk disease L2-L3 with chronic  radiculopathy status post 3-4 fusion with__________ left 3-4.   PROCEDURE:  Bilateral L2 laminectomy and facetectomy, posterolateral  fusion with BMP and autograft, pedicle screws L2-L3, Cell Saver C-arm.   SURGEON:  Hilda Lias, M.D.   ASSISTANT:  Clydene Fake, M.D.   CLINICAL HISTORY:  Tonya Hall is a lady whom about 4 years ago underwent  fusion at the level 3-4.  Lately she has been complaining of back pain,  radiation to both legs, left worse than the right one.  The patient  __________  was degenerative disk disease with osteoporosis and a  chronic intake of prednisone.  We have done quite a bit of conservative  treatment including epidural injection, physical therapy and she is not  any better.  X-rays show severe degenerative disk disease at L2-3.  Surgery was advised as a last resource.  The risks were explained to her  in the history and physical.   DESCRIPTION OF PROCEDURE:  Tonya Hall was taken to the OR and she was  positioned in a prone manner.  The back was cleaned with DuraPrep.  Midline incision following the previous one going all the way up was  made until we found the spinous process of L2.  From then on we went  down until we found the L3-L4 pedicle screws.  Dissection was carried  out all the way laterally.  Immediately what we did was to remove the  rod of the previous fusion, after that we had to remove  the caps at the  level of 3-4.  The rod was removed.  We left the pedicle screw in place.  From then on, after we had good x-ray position, we removed the spinous  process of L2.  The bone was quite soft.  Then with the drill and the  Kerrison punch, we did a bilateral laminectomy of L2 with facetectomy.  We tried to enter the disk space after attempting the right and left  side, it was impossible, it was quite narrow.  Because of that we went  ahead and decompressed the L2 and L3 nerve roots.  Then using the C-arm,  first in AP view and then a lateral view, we found the pedicle of L2.  There were quite soft.  The one on the right side at the pedicle screws  which was introduced was __________  breaking the medial aspect but no  compression whatsoever of the dural sac.  Because of the osteal process,  we left that pedicle in place.  Then we did  the same thing on the left  side.  Using the previous L3 pedicle screws, we used a rod with caps to  keep the area fused.  At the end we had good position of the bone graft  and the plate.  Investigated again and there was no evidence of any  __________  or compromise of the nerve root.  The patient had quite a  bit of foraminotomy to decompress not only the L2 but also the L3 nerve  root.  From then on we went laterally.  We remained mostly periosteal of the  lateral facet of 2-3 bilaterally, as well as the transverse process of 2-  3.  Then a mix of BMP and autograft was used for arthrodesis.  Fentanyl  was left in the dural space and the wound was closed with Vicryl and  Steri-Strips.           ______________________________  Hilda Lias, M.D.     EB/MEDQ  D:  02/05/2007  T:  02/06/2007  Job:  [id number]

## 2010-08-15 NOTE — Op Note (Signed)
Tonya Hall, Tonya Hall               ACCOUNT NO.:  [id number]   MEDICAL RECORD NO.:  [id number]          PATIENT TYPE:  AMB   LOCATION:  DAY                           FACILITY:  [facility]   PHYSICIAN:  Kassie Mends, M.D.      DATE OF BIRTH:  1950/06/07   DATE OF PROCEDURE:  DATE OF DISCHARGE:                               OPERATIVE REPORT   REFERRING PHYSICIAN:  Kingsley Callander. Ouida Sills, MD   PROCEDURE:  1. Colonoscopy.  2. Esophagogastroduodenoscopy with cold forceps biopsy.   INDICATION FOR EXAM:  Ms. Tonya Hall is a 60 year old female who presents  for average-risk colon cancer screening.  She also is having new onset  dyspepsia.   FINDINGS:  1. Tortuous colon.  Rare sigmoid colon diverticula.  Otherwise, no      evidence of polyps, masses, inflammatory changes, or AVMs.  2. Small internal hemorrhoids.  Otherwise, normal retroflexed view of      the rectum.  3. Normal esophagus without evidence of Barrett's, mass, erosion,      ulceration, or stricture.  4. Multiple antral erosions associated with diffuse erythema in the      body and the antrum.  Biopsies obtained via cold forceps to      evaluate for H. pylori gastritis.  Normal duodenal bulb and second      portion of the duodenum.   DIAGNOSES:  1. Mild sigmoid colon diverticulosis.  2. Small internal hemorrhoids.  3. Moderate gastritis.   RECOMMENDATIONS:  1. Continue Nexium 40 mg daily.  Take 30 minutes prior to first meal.  2. We will call her with the results of her biopsies.  3. She should follow a high-fiber diet.  She was given a handout on      high-fiber diet, diverticulosis, hemorrhoids. and gastritis.  4. No aspirin or NSAIDs for 30 days.  No anticoagulation for 5 days.  5. Screening colonoscopy in 10 years.   MEDICATIONS:  1. Demerol 25 mg IV.  2. Versed 5 mg IV.  3. Phenergan 6.25 mg IV.   PROCEDURE TECHNIQUE:  Physical exam was performed.  Informed consent was  obtained from the patient after explaining the benefits,  risks, and  alternatives to the procedure.  The patient was connected to the monitor  and placed in the left lateral position.  Continuous oxygen was provided  by nasal cannula, and IV medicine administered through an indwelling  cannula.  After administration of sedation and rectal exam, the  patient's rectum was intubated, and the scope was advanced under direct  visualization to the cecum.  The scope was removed slowly by carefully  examining the color, texture, anatomy, and integrity of the mucosa on  the way out.   After the colonoscopy, the patient's esophagus was intubated with a  diagnostic gastroscope, and the scope was advanced under direct  visualization to the second portion of the duodenum.  The scope was  removed slowly by carefully examining the color, texture, anatomy, and  integrity of the mucosa on the way out.  The patient was recovered in  endoscopy and discharged home in satisfactory  condition.   PATH:  Reactive gastropathy      Kassie Mends, M.D.  Electronically Signed     SM/MEDQ  D:  06/18/2008  T:  06/19/2008  Job:  [id number]   cc:   Kingsley Callander. Ouida Sills, MD  Fax: [phone]

## 2010-08-15 NOTE — H&P (Signed)
Tonya Hall, Tonya Hall               ACCOUNT NO.:  [id number]   MEDICAL RECORD NO.:  [id number]          PATIENT TYPE:  INP   LOCATION:  3014                         FACILITY:  [facility]   PHYSICIAN:  Hilda Lias, M.D.   DATE OF BIRTH:  09-28-1950   DATE OF ADMISSION:  02/05/2007  DATE OF DISCHARGE:                              HISTORY & PHYSICAL   HISTORY OF PRESENT ILLNESS:  Tonya Hall is a lady who in the past  underwent fusion at the level of L3-4 with a secondary arachnoiditis.  Nevertheless, I have been following this lady for a long while and she  is not any better.  Right now she is having quite a bit of back pain  radiation to both legs, left worse than right one.  She had failed with  conservative treatment, including thecal injection.  Because of the  findings, she wanted to proceed with surgery.   PAST MEDICAL HISTORY:  Diskectomy and fusion.  Also, she has had fusion  at the level of L3-4.   ALLERGIES:  SHE IS ALLERGIC TO HUMIRA, SULFA, IMURAN, PLAQUENIL, AND  METHOTREXATE.   SOCIAL HISTORY:  She does not smoke.  She drinks socially.   FAMILY HISTORY:  Negative.   REVIEW OF SYSTEMS:  Positive for high cholesterol and high blood  pressure.   PHYSICAL EXAMINATION:  GENERAL:  Patient appeared to my office.  The  last time I saw her was 5 weeks ago.  She was quite miserable.  She was  complaining of pain down to her left leg.  HEENT:  Normal.  NECK:  She is able to move her neck without minimal discomfort.  LUNGS:  Clear.  CARDIOVASCULAR:  Heart sounds normal.  No murmur.  Normal pulse.  NEURO:  She has a femoral stretch maneuver, which is positive bilateral.  She has weakening of both quadrants, left worse than right.  She has  some decreased flexibility in the lumbar spine.   LABORATORY DATA:  The x-rays show that she has had severe degenerative  disk at the level of L3-4 with foraminal stenosis, left worse than  right, and arachnoiditis at L3-4.   IMPRESSION:  1. L2-3 degenerative disk disease with problems of radiculopathy.  2. L3-4 arachnoiditis.   PLAN:  The patient is going to be admitted for surgery.  The procedure  will be L2-3 diskectomy with interval fusion with pedicle screws, which  essentially was what she had before.  She knows the risks involving  infection, CSF leak,  damage to the base of the base of the abdomen.  Damage to the conus with  a secondary problem with bladder and bowel incontinence.  Also, knows  about failure of the surgery especially in view of the findings for  arachnoiditis at L3-4.           ______________________________  Hilda Lias, M.D.     EB/MEDQ  D:  02/05/2007  T:  02/05/2007  Job:  [id number]

## 2010-08-15 NOTE — Consult Note (Signed)
Tonya Hall, Tonya Hall               ACCOUNT NO.:  [id number]   MEDICAL RECORD NO.:  [id number]          PATIENT TYPE:  AMB   LOCATION:  DAY                           FACILITY:  [facility]   PHYSICIAN:  Kassie Mends, M.D.      DATE OF BIRTH:  28-Oct-1950   DATE OF CONSULTATION:  DATE OF DISCHARGE:                                 CONSULTATION   REQUESTING PHYSICIAN:  Kingsley Callander. Ouida Sills, MD   REASON FOR CONSULTATION:  Refractory indigestion and needs screening  colonoscopy.   HISTORY OF PRESENT ILLNESS:  Tonya Hall is a 60 year old Caucasian  female.  She has had problem history of refractory indigestion despite  daily Nexium 40 mg.  She tells me she has had some intermittent GERD  symptoms with certain foods like steak salads and Spaghetti over the  course of her adult life.  However, furthermore for the better part of  the last year-and-half, she has had more frequent daily indigestion, the  last is 6 months specifically.  She has had severe problems.  Her  symptoms are worse in the morning and worse at bedtime.  She is having  severe episodes at least twice per week.  She denies any nausea or  vomiting.  She has had some pill dysphagia, especially in the evenings  with her potassium pill.  She denies any odynophagia and she denies  anorexia or early satiety.  Her weight has been relatively stable,  although she did lose a few pounds with Topamax.  She has had some  bloating at times she previously took Rolaids and had to drink a Coke to  relieve the bloating.  Her bowel movements have been normal, soft, and  brown.  She denies rectal bleeding or melena.  Denies any constipation  or diarrhea.  She feels as though she may have some gallbladder  attacks.  She has some epigastric soreness along with right upper  quadrant soreness at times.  She had an ultrasound  many years ago,  which was normal.   PAST MEDICAL AND SURGICAL HISTORY:  Hypertension, seasonal allergies,  psoriatic arthritis,  degenerative disk disease, neuropathy, GERD,  impaired fasting glucose, hypokalemia, osteoporosis, and Raynaud  syndrome.  She has had 2 C-sections, 1 D and C, a miscarriage, or a  spontaneous abortion, had tonsillectomy.  Tonsils and adenoids removed.  A diskectomy and L3 and L4 fusion and L4 and L5 fusion.   CURRENT MEDICATIONS:  1. Flexeril 5 mg in the morning 10 mg in the evening.  2. Chlorthalidone 25 mg daily.  3. Prednisone 5 mg daily.  4. Amlodipine 5 mg daily.  5. Leflunomide 20 mg nightly.  6. Topamax 200 mg nightly.  7. Aspirin 81 mg daily.  8. Potassium chloride 20 mEq t.i.d.  9. Hydrocodone 5/500 mg p.r.n.  10.Zyrtec p.r.n.  11.Vagifem weekly.  12.Calcium 600 mg t.i.d.  13.Vitamin D 2000 mg daily.  14.Glucosamine 2000 mg daily.  15.Fish oil 2000 mg daily.  16.FiberChoice b.i.d.  17.Enbrel 50 mg daily.  18.Generic statin 1 nightly.  19.IV Boniva q.3 months.  20.Nexium 40 mg daily.  ALLERGIES:  IMURAN causes rash, METHOTREXATE caused transaminitis,  PLAQUENIL caused rash, SULFASALAZINE caused rash, and HUMIRA caused  rash.   FAMILY HISTORY:  Mother is alive with history of rheumatoid arthritis,  degenerative disk disease, diabetes mellitus, and empty sella syndrome,  and fibromyalgia.  Father deceased at 52 secondary to rheumatic fever  and endocarditis.  She has 2 sisters, 1 with fibromyalgia, 1 healthy  brother.   SOCIAL HISTORY:  Ms. Bazar has been married for 38 years.  She has 2  grown healthy children.  She resides in [place].  She is a full-time  Scientist, physiological at [employer].  She denies any tobacco,  alcohol, or drug use.   REVIEW OF SYSTEMS:  See HPI, otherwise negative.   PHYSICAL EXAMINATION:  VITAL SIGNS:  Weight is 32 pounds, height 59-1/2  inches, temperature 98.1, blood pressure 124/72, and pulse 84.  GENERAL:  She is a well-developed and well-nourished Caucasian female in  no acute distress.  HEENT:  Sclerae clear, nonicteric.  Conjunctivae pink. Oropharynx pink  and moist without any lesions.  NECK:  Supple without mass or thyromegaly.  CHEST:  Heart regular rate and rhythm.  Normal S1 and S2 without  murmurs, clicks, rubs, or gallops.  LUNGS:  Clear to auscultation bilaterally.  ABDOMEN:  Positive bowel sounds x4.  No bruits auscultated.  Soft,  nontender, and nondistended without palpable mass or hepatosplenomegaly.  No rebound tenderness or guarding.  Negative Murphy sign.  EXTREMITIES:  Without clubbing or edema.   IMPRESSION:  Tonya Hall is a 60 year old Caucasian female with a 1-1/2-  year history of refractory indigestion despite proton pump inhibitor.  Symptoms are definitely made worse with certain foods.  She has also had  some pill dysphagia.  She is going to need further evaluation to rule  out complicated gastroesophageal reflux disease including the erosive  reflux esophagitis, and the development of possible web, ring, or  stricture.  It is possible that she could have gallbladder issues as  well and we may need to address this after evaluation of her upper  gastrointestinal tract.  She has never had a screening colonoscopy and  is requesting one at this time.   PLAN:  1. Screening colonoscopy and EGD with possible esophageal dilatation      with Dr. Cira Servant in the near future.  Discussed procedure including      risks and benefits included, but not limited to infection,      perforation, and drug reaction.  She agrees with this plan and      consent will be obtained.  2. Continue Nexium 40 mg daily for now.  3. If EGD is negative, we would consider gallbladder workup.   Thank you Dr. Ouida Sills for allowing [place] to participate in the care of Ms.  Phaneuf.      Lorenza Burton, N.P.      Kassie Mends, M.D.  Electronically Signed    KJ/MEDQ  D:  05/12/2008  T:  05/12/2008  Job:  [id number]   cc:   Kingsley Callander. Ouida Sills, MD  Fax: [phone]

## 2010-08-18 NOTE — Discharge Summary (Signed)
[place]  Patient:    Tonya Hall, Tonya Hall Visit Number: [id number] MRN: [id number]          Service Type: SUR Location: 3000 3012 01 Attending Physician:  Danella Penton Dictated by:   Tanya Nones. Jeral Fruit, M.D. Admit Date:  08/29/2001 Discharge Date: 09/02/2001                             Discharge Summary  PRINCIPAL DIAGNOSIS: L4-5 degenerative disk disease with chronic L5 radiculopathy.  DISCHARGE DIAGNOSIS:  L4-5 degenerative disk disease with chronic L5 radiculopathy.  HISTORY OF PRESENT ILLNESS: The patient was admitted with radiculopathy of the left leg.  The patient was quite miserable.  She was complaining of back pain with weakness of her left leg.  The x-rays indeed showed that she has degenerative disk disease at L4-5.  She had previous surgery at that level. She has failed with every single conservative treatment.  Because of th at I agreed to take her to surgery.  LABORATORY:  Within normal limits.  COURSE IN HOSPITAL: The patient was taken to surgery and underwent a bilateral L4-5 diskectomy followed by pedicle screw, interbody fusion and posterior lateral fusion as well as lysis of adhesions being accomplished.  After surgery the patient is ambulating and she is doing really well. The only other complaint was being lack of bowel movement.  She is going to have an enema before she leaves today.  CONDITION ON DISCHARGE:  Improved.  DISCHARGE MEDICATIONS: 1. Percocet. 2. Aspirin.  DIET:  Regular.  ACTIVITY:  She is not to drive for at least two weeks.  FOLLOW-UP:  She will be seen in my office in ten days. Dictated by:   Tanya Nones. Jeral Fruit, M.D. Attending Physician:  Danella Penton DD:  09/02/01 TD:  09/03/01 Job: [id number]

## 2010-08-18 NOTE — H&P (Signed)
[place]  Patient:    Tonya Hall, Tonya Hall Visit Number: [id number] MRN: [id number]          Service Type: SUR Location: 3000 3012 01 Attending Physician:  Danella Penton Dictated by:   Tanya Nones. Jeral Fruit, M.D. Admit Date:  08/29/2001                           History and Physical  HISTORY OF PRESENT ILLNESS: Ms. Cheong is a lady who underwent L4-5 diskectomy back in 1998 because of herniated disk.  In 1999 she she complained of stiffness in the morning with pain down to the left leg.  She was seen by a neurosurgeon in the area who went ahead and gave her epidural injection with no improvement whatsoever.  The patient is miserable.  She feels this is getting worse.  At this time the pain is unbearable.  Medications have helped with the pain but she is not totally pain-free.  The patient had an MRI and she was sent to [place] for evaluation.  PAST MEDICAL HISTORY:  1. Tonsillectomy.  2. Diskectomy.  ALLERGIES: She is not allergic to any medications but MORPHINE gives her some nausea.  SOCIAL HISTORY: The patient does not smoke or drink.  FAMILY HISTORY: Mother is 69 with fibromyalgia and gout.  REVIEW OF SYSTEMS: Positive for back pain and leg weakness.  MEDICATIONS:  1. Mobic.  2. Premarin.  3. Micronor.  4. Multivitamin.  PHYSICAL EXAMINATION:  VITAL SIGNS: Height 5 feet.  Weight 142 pounds.  HEENT: Normal.  NECK: Normal.  LUNGS: Clear.  CARDIAC: Heart sounds normal.  ABDOMEN: Normal.  EXTREMITIES: Normal pulses.  NEUROLOGIC: Mental status normal.  Cranial nerves normal.  Strength is 5/5 with some weakness of dorsiflexion of the left foot.  Sensation, she complains of numbness which involves mostly the L5 nerve root.   Straight-leg-raise is positive at 80 degrees.  She has a brachial stretch maneuver which is positive on the left side and negative on the right side.  LABORATORY DATA: The MRI showed that she has some  changes at L3-4, at the level of 4-5.  She has abnormality with flexion and extension to that point and the foramen narrow with extension but open with flexion.  The patient had a lumbar myelogram and it showed that indeed she has a severe case of degenerative disk disease at the level of 4-5 to that point and there is no space and there is quite a bit of narrowing.  She also seems to have a herniated disk at L4-5.  CLINICAL IMPRESSION: L4-5 degenerative disk disease with chronic radiculopathy.  PLAN: The patient is being admitted for surgery.  The procedure will be an L4-5 diskectomy with interbody fusion with pedicular screws and posterolateral fusion.  The surgery was fully explained to her.  She is a Engineer, civil (consulting) and I was able to explain easily about the risks such as no improvement whatsoever, need for further surgery, damage to the vessels of the abdomen, failure of the plate and the graft as well as the pedicle screws, infection, problems with bladder or bowel. Dictated by:   Tanya Nones. Jeral Fruit, M.D. Attending Physician:  Danella Penton DD:  08/29/01 TD:  08/30/01 Job: [id number]

## 2010-08-18 NOTE — Assessment & Plan Note (Signed)
HISTORY OF PRESENT ILLNESS:  Tonya Hall returns today, after I last saw Tonya Hall  on January 25, 2003, when Tonya Hall received a trigger point injection of the left  hip as well as left anterior deltoid. Tonya Hall has had some lasting improvement  with the anterior deltoid in terms of tenderness other than when Tonya Hall  repetitively uses Tonya Hall right upper extremity at shoulder level. Tonya Hall states  that Tonya Hall hip pain is not much better but notes less tenderness over that  area. Overall, Tonya Hall is doing very well and Tonya Hall pain scores are 1 out of 10  and Tonya Hall pain in Tonya Hall previous scores are 1 out of 10 for all 7 functional  items. Tonya Hall states that the Kadian 20 mg gives Tonya Hall 90% relief of the pain at  a once a day dosage. Tonya Hall is even thinking that Tonya Hall may want to come off of  that.   CURRENT MEDICATIONS:  1. Valium 2 mg p.r.n.  2. Hydrocodone 5/500. Tonya Hall has not received a prescription for this from me.     Tonya Hall had some at home but really has not had to use them at all.  3. Calcium 600 b.i.d.  4. Multivitamin 1 p.o. q.d.  5. Omega III 2 to 3 tabs b.i.d.  6. Glucosamine 2,000 a day.  7. Mobic 15 p.o. q.d.  8. Flexeril 5 mg 1 p.o. b.i.d. Actually takes this a lot less than that.  9. Kadian 20 mg a day.   COMPLICATIONS:  Tonya Hall has had no new medical complications since I last saw  Tonya Hall.   PHYSICAL EXAMINATION:  VITAL SIGNS:  Blood pressure 152/88, pulse 88, O2 sat  96% on room air.  GENERAL:  Mood and affect appropriate. Appearance is normal. Gait is normal.  BACK:  No tenderness to palpation, even over the hip area.  EXTREMITIES:  Tonya Hall has full range of motion bilateral upper and lower  extremities. Normal strength bilateral upper and lower extremities. Normal  deep tendon reflexes bilateral upper and lower extremities.   IMPRESSION:  1. History of lumbar pain as well as chronic left hip pain. Tonya Hall has had some     improvement with trigger point injection. Tonya Hall has a history of     laminectomy fusion L4-5 level with some  hypertrophic fibrosis L3-4.  2. Chronic recurrent trochanteric bursitis.  3. Osteoarthritis bilateral hands and possibly left hip.   PLAN:  1. Tonya Hall is doing well on the Kadian 20 mg a day but would like to taper off     of this. Will continue this over the next month and then next visit, I     will write a tapering schedule over the course of approximately 1 weeks     time.  2. Have not written out any further Hydrocodone, and really has not taken     any of this.  3. Continue Flexeril 5 mg p.o. t.i.d. p.r.n.  4. Continue current physical activities.   FOLLOW UP:  I will see Tonya Hall back in 1 month.      Erick Colace, M.D.   AEK/MedQ  D:  03/09/2003 13:44:45  T:  03/09/2003 14:56:16  Job #:  [id number]   cc:   Lunette Stands, M.D.  [address]  [place] [address]  Fax: [phone]   Kingsley Callander. Ouida Sills, M.D.  [address]  [place]  [place] [address]  Fax: [phone]   Hilda Lias, M.D.  [address]  [place], [place] [address]  Fax:  [phone]   Sanjeev K. Corliss Skains, M.D.  [address] [place]  [place]  [place] [address]  Fax: [phone]

## 2010-08-18 NOTE — Procedures (Signed)
[facility]  Patient:    Tonya Hall, Tonya Hall                        MRN: [id number] Proc. Date: 10/25/00 Adm. Date:  [id number] Attending:  Thyra Breed CC:         Julio Sicks, M.D.  Carylon Perches, M.D.   Procedure Report  PROCEDURE:  Transforaminal epidural steroid injection at L2.  DIAGNOSIS:  Degenerative disk disease with extraforaminal disk protrusion to the left at L2 with associated left hip and anterior thigh pain.  HISTORY:  Tonya Hall is a 60 year old who is sent to [place] by Dr. Jordan Likes for an injection.  The patient relates a history of pain which began back at age 8 which was intermittent at that time, and she was advised that it was coming from her SI joint.  Approximately 10 years ago, it was associated more with hip discomfort.  At that time, she would be treated periodically with trochanteric bursal injections with corticosteroids or with Dosepaks which would help.  Approximately 6-8 years ago, her pain became more prominent, and she was seen by Dr. Jeral Fruit who advocated epidural steroid injections.  She went through a series of these one time with no improvement and at the next go-around, surgery was advocated, and she underwent an L3-4 diskectomy with laminotomy by Dr. Jeral Fruit on the left side.  She did well for about nine months but then began to develop left hip discomfort, radiating into the left iliac crest region and left trochanteric region and the ischial notch area.  It was associated with a discomfort that would radiate over the left aspect of her left calf.  She continued to follow up with Dr. Charlett Blake and until recently, Dr. Jeral Fruit, who is not currently covered by her insurance company.  She was more recently seen by Dr. Jordan Likes, at which time she was complaining of left hip and left anterior thigh pain, radiating into the groin, exacerbated by prolonged standing or walking with associated numbness, tingling, and intermittent weakness.   She underwent an MRI of the spine which demonstrated shallow disk protrusion at L2-3 and previous surgical intervention.  MRI of her hips demonstrated unremarkable SI joints and hip joints.  She was sent for a trial of transforaminal injections today.  She has been treated with Mobic recently and continues on glucosamine.  CURRENT MEDICATIONS:  Prempro, Mobic, glucosamine, calcium, and multivitamins. She has p.r.n. medications in the form of Percogesic, Tylenol No. 1, and Darvocet-N 100.  ALLERGIES:  She is allergic to no medications but does get sick to her stomach with MORPHINE.  FAMILY HISTORY:  Positive for breast cancer, diabetes, strokes, COPD, and asthma.  ACTIVE MEDICAL PROBLEMS:  The patient had an atopic ______  as a child with atopic dermatitis and asthma which she apparently has grown out of.  She has had problems with nonsteroidal anti-inflammatory agents causing gastropathy. She has osteoarthritis of her hands and knees as well as to a lesser extent, in her back.  She has had recurrent allergic rhinitis.  SOCIAL HISTORY:  The patient is a nonsmoker, nondrinker.  She is an Astronomer.  PAST SURGICAL HISTORY:  Significant for two cesarean sections, D&C, and the disk surgery.  REVIEW OF SYSTEMS:  GENERAL:  Significant for night sweats when she takes prednisone.   HEAD:  Negative.  EYES:  Negative. NOSE/MOUTH/THROAT:  Significant for allergic rhinitis.  EARS:  Negative. PULMONARY:  Negative except for a remote history of  asthma.  CARDIOVASCULAR: Negative.  GI:  Negative except for gastropathy, as mentioned above.  GU: Negative.  MUSCULOSKELETAL/NEUROLOGICAL:  See HPI.  No history of seizure or stroke.  CUTANEOUS:  History of atopic dermatitis.  HEMATOLOGIC:  Negative. ENDOCRINE:  Negative.  PSYCHIATRIC:  Negative.  ALLERGY/IMMUNOLOGIC:  See active medical problems.  PHYSICAL EXAMINATION:  VITAL SIGNS:  Blood pressure 144/72, heart rate 86, respiratory rate 16,  O2 saturations 99%.  Pain level is 3/10.  GENERAL:  This is a pleasant, anxious female in no acute distress.  HEENT:  Head is normocephalic, atraumatic.  Eyes with extraocular movements intact with conjunctivae and sclerae clear.  Nose with patent nares without discharge.  Oropharynx is free of lesions.  NECK:  Supple without lymphadenopathy.  Carotids are 2+ and symmetric without bruits.  LUNGS:  Clear.  HEART:  Regular rate and rhythm.  BREASTS/ABDOMINAL/PELVIC/RECTAL:  Not performed.  BACK:  Well-healed surgical scar with intact forward flexion and hyperextension.  She has minimal tenderness to palpation over the facet joints.  She is tender over the left SI joint region, left trochanteric region, and left sciatic notch to palpation.  Internal rotation of her hip elicited some discomfort localized to the hip region with no radiation in the sciatic distribution.  Negative straight leg raise signs.  EXTREMITIES:  The patient demonstrates some mild bony enlargement of the DIPs, PIPs, and first carpal metacarpal joints of the hands as well as the first MTPs of the feet with dorsalis pedis pulses and radial pulses 2+ and symmetric.  NEUROLOGIC:  The patient is oriented to person, place, time, and reason for visit.  Cranial nerves 2-12 are grossly intact.  Deep tendon reflexes are symmetric in the upper and lower extremities with with downgoing toes.  Motor is 5/5 with symmetric bulk and tone.  Coordination is grossly intact.  Sensory is intact to pinprick, light touch, and vibratory sense.  IMPRESSION: 1. Left hip discomfort which may be an element of a radicular pain versus    piriformis syndrome versus trochanteric bursitis versus soft tissue pain    syndrome. 2. Other medical problems per primary care physician.  DISPOSITION:  I discussed the potential risks, benefits, and limitations of a transforaminal epidural steroid injection in detail with the patient.  She  wishes  to proceed with this.  DESCRIPTION OF PROCEDURE:  After informed consent was obtained, the patient was taken to the fluoroscopy suite where was placed in the prone position with a pillow under her abdomen and monitored.  Using fluoroscopic guidance, I optimized visualization of the disk at L2-3 to square these up.  In an oblique position, the visualization of the facet joint was optimized.  I marked the 6 oclock position on the pedicle, and the area was prepped with Betadine x 3 and draped.  Using a 25 gauge needle, I anesthetized the skin and subcutaneous structures with 1 cc of 1% lidocaine.  A 22 gauge Chiba needle was introduced down to the pedicle at 6 oclock, contacting the inferior margin of the pedicle without contacting the nerve.  Aspiration was negative for blood and CSF.  I injected contrast which showed good distribution.  A 1 cc volume of contrast was used.  This was followed by 1 cc of 1% lidocaine mixed with 40 mg of Medrol which was injected very slowly.  The needle was flushed and removed intact.  Immediately after the procedure, the patient noted that most of her pain in her hip had resolved.  She continued  to have some buttock discomfort, but her hip region felt markedly better.  I advised her this was likely coming from the lidocaine that was used and we would see how well this held with the corticosteroids used.  POSTPROCEDURE CONDITION:  Stable.  DISCHARGE INSTRUCTIONS: 1. Resume previous diet. 2. Limitations on activities per instruction sheet. 3. Continue on current medications. 4. Follow up with me in two weeks to consider a repeat injection via the    transforaminal approach. DD:  10/25/00 TD:  10/26/00 Job: [id number]

## 2010-08-18 NOTE — Assessment & Plan Note (Signed)
Tonya Hall returns today.  She has a history of lumbar pain, chronic left  hip pain, history of laminectomy and fusion L4-5 level with some epidural  fibrosis at L3-4, chronic trochanteric bursitis and probable left SI joint  arthropathy.   Her pain has been well controlled with the Kadian down to a 0 to 1 level;  however, she has problems with constipation despite Senokot two p.o. b.i.d.  She also has been started on prednisone since I last saw her.  This is  primarily for the osteoarthritis in her hands.  She feels like this is  helping her hands.   Current pain level is 1/10 averaging  0 to 1 and worst 2.  This is when she  did not take her Kadian for 36 hours, she got up to a two and this is mostly  left hip and thigh.  Pain interference score general activity 1, mood 1,  walking ability 1, normal work 1, relationship with other people 0, sleep 0,  enjoyment of life 1.   REVIEW OF SYMPTOMS:  Positive for constipation as noted above.  Also hand  pain which has improved somewhat since starting prednisone.   Interval history as above.  No change in social.   PHYSICAL EXAMINATION:  Blood pressure 131/94, pulse 91, respiratory rate 16.  Gait is normal.  Affect is bright.  Appearance is normal.  She has no  evidence of edema.  Spine has no tenderness to palpation.  Hips have pain to  palpation over greater trochanter bilaterally.  Her range of motion is full  bilateral lower extremities.  Normal stability, normal strength bilateral  lower extremities.  Deep tendon reflexes are normal.  Spine range of motion  is normal.  No pain to palpation otherwise in the lower extremities.   IMPRESSION:  1. Chronic lumbar pain, post laminectomy syndrome plus probable SI     arthropathy.  2. Chronic recurrent trochanteric bursitis.  3. Osteoarthritis, bilateral hands.   PLAN:  The patient wishes to come off the Kadian.  She is on a very small  dose and I think we can taper her rapidly using  hydrocodone 5 mg t.i.d. x1  day, b.i.d. x1 day, and daily x1 day.  She will likely be able to come off  her Senokot with this.   It was discussed with the patient, she could follow up with her primary  doctor as well as her rheumatologist and orthopedist now that she is off  narcotics.  If she needs more intensive pain management, we would be happy  to see her again.  I will see her back on a p.r.n. basis.      Erick Colace, M.D.   AEK/MedQ  D:  04/26/2003 10:07:59  T:  04/26/2003 10:37:47  Job #:  [id number]   cc:   Pollyann Savoy, M.D.  [address]  [place], [place] [address]  Fax: [phone]   Lunette Stands, M.D.  [address]  [place] [address]  Fax: [phone]

## 2010-08-18 NOTE — Procedures (Signed)
[facility]  Patient:    Tonya Hall, ROMA Visit Number: [id number] MRN: [id number]          Service Type: PMG Location: [facility] Attending Physician:  Thyra Breed Proc. Date: 11/22/00 Adm. Date:  10/24/2000   CC:         Julio Sicks, M.D.  Carylon Perches, M.D.   Procedure Report  PROCEDURE:  Transforaminal epidural steroid injection at L2.  DIAGNOSIS:  Degenerative disk disease with extraforaminal protrusion to the left at L2.  INTERVAL HISTORY:  The patient continues to note lessening of her pain with minimal side effects.  Her medications are unchanged.  PHYSICAL EXAMINATION:  Blood pressure 145/73, heart rate 88, respiratory rate 16, O2 saturations 98%.  The patients exam is unchanged.  DESCRIPTION OF PROCEDURE:  After informed consent was obtained, the patient was placed in the fluoroscopy suite in a prone position on the fluoroscopy table with a pillow under her abdomen and monitored.  Using fluoroscopic guidance, I identified the lumbar vertebrae of L1 through L5.  The pedicle of L2 was optimally visualized.  A mark was made at the 6 oclock position of the pedicle on the skin overlying this area.  The area was prepped with Betadine and draped.  Using a 25 gauge needle, I anesthetized the skin and subcutaneous structures with 1.5 cc of 1% lidocaine.  A 22 gauge Chiba needle was introduced down to the pedicle at 6 oclock, contacting the inferior margin of the pedicle without contacting the nerve.  Aspiration was negative for blood and CSF.  I injected 1 cc of contrast but had distribution distally and not proximally, so the needle was repositioned.  An additional 0.5 cc of contrast was injected.  Good distribution in the epidural space was identified.  A mixture of 40 mg of Medrol with 1 cc of 1% lidocaine was injected.  The needle was flushed and removed intact.  POSTPROCEDURE CONDITION:  Stable with marked improvement in her  pain.  DISPOSITION: 1. Resume previous diet. 2. Limitations on activities per instruction sheet, as outlined by my    assistant today. 3. Continue on current medications. 4. The patient was advised that we would need to hold off for at least six    months before pursuing this avenue of injections again. Attending Physician:  Thyra Breed DD:  11/22/00 TD:  11/23/00 Job: [id number]

## 2010-08-18 NOTE — Op Note (Signed)
[facility]  Patient:    DEVERY, ODWYER Visit Number: [id number] MRN: [id number]          Service Type: SUR Location: [id number] Attending Physician:  Danella Penton Dictated by:   Tanya Nones. Jeral Fruit, M.D. Proc. Date: 08/29/01 Admit Date:  08/29/2001                             Operative Report  PREOPERATIVE DIAGNOSIS:  L4-5 degenerative disk disease with a chronic left L5 radiculopathy.  POSTOPERATIVE DIAGNOSIS:  L4-5 degenerative disk disease with a chronic left L5 radiculopathy.  PROCEDURE: 1. Bilateral laminectomy at the level of L4-5 with bilateral diskectomy. 2. Lysis of adhesions mostly involving the L5 nerve root. 3. Interbody fusion using bone allograft. 4. Pedicle screws from L4 to L5. 5. Posterolateral fusion. 6. C-arm. 7. Cell Saver.  SURGEON:  Tanya Nones. Jeral Fruit, M.D.  ASSISTANT:  Cristi Loron, M.D.  CLINICAL HISTORY:  Ms. Goel is a 60 year old female who underwent surgery many years ago.  Lately she has complained of worsening pain over the S1 to the left leg.  She had conservative treatment including injections _____. She had been seen also by her local neurosurgeon.  The patient many years ago had surgery on the left side.  The MRI was not helpful, but the myelogram and CT scan post myelogram shows a degenerative disk disease up to the point that there was no space between 4-5.  Surgery was advised.  The risks were explained in the history and physical.  DESCRIPTION OF PROCEDURE:  Ms. Gault was taken to the OR, and she was positioned in a prone manner.  The back was prepped with Betadine and drapes were applied.  We brought the C-arm and we were able to localize the area between 4-5.  Then an incision resecting the previous scar was made.  Muscles were retracted laterally.  In the right side we were able to retract the muscle, but on the left side she had quite a bit of fibrosis.  We did our lysis  of adhesions until we were able to remove part of the facet.  Indeed, the dura mater at this level was quite friable.  There was an opening. Because of that we decided to go ahead with a bilateral laminectomy.  Having done this, we were able to go into the right side and retract the L5-S1 nerve root going to the disk space.  The disk was quite narrow, and we had use some interbody dilators.  Then we came back into the left side, where lysis was accomplished, and we were able to really decompress the L4 and L5 nerve root. Then the disk space was entered and using the dilator, finally with the curette we were able to introduce an 8 mm bone graft bilaterally laterally and in the middle part of the bone graft we were able to introduce some autograft. Then our attention was taken to the L4 and L5 pedicle screws.  With the C-arm we were able to introduce the pedicle probe and later on the pedicle screws. This was done under the help of the C-arm.  This was accomplished bilaterally, and AP and lateral showed good position of the pedicle screws.  Then a rod was put between 4-5 pedicle screws bilaterally and were tightened in place.  We went laterally, and we were able to localize the spinous processes of L4 and L5.  We drilled it, and the rest of the autograft was applied into this area bilaterally.  Then we brought the microscope into the area and with 6-0 Prolene, we were able to close the small gap.  This was followed by Tisseel. Valsalva maneuver prior to applying the Tisseel _____ was essentially negative.  From then on the area was irrigated.  We investigated the L4 and L5 nerve roots bilaterally, and they were plenty open.  Then the wound was irrigated and closed with Vicryl and nylon.  The patient did well.  During the procedure we had quite a bit of bleeding, mostly at the beginning of the case when we were dissecting at the dural sac and the L5 and L4 nerve root in the left side.   Transfusion was used despite using the Cell Saver. Dictated by:   Tanya Nones. Jeral Fruit, M.D. Attending Physician:  Danella Penton DD:  08/29/01 TD:  09/01/01 Job: [id number]

## 2010-08-18 NOTE — Assessment & Plan Note (Signed)
Tonya Hall returns today after I last saw her December 21, 2003.  She was  doing quite well until March 27, 2004 when she feels like she had an  increase in low back pain. She saw Pollyann Savoy, M.D. who diagnosed  sacroiliac joint arthropathy and felt this is related to psoriatic  arthritis.  The patient also does have rheumatoid arthritis by history.  The  patient bilateral sacroiliac joint injections done without fluoroscopic  guidance in the office and this did not help with her pain.   CURRENT MEDICATIONS:  1.  Mobic 15 p.o. q.d.  2.  Flexeril 5 t.i.d.  3.  Prednisone 5 mg q.d.  4.  Hydrocodone 10/650, 1/2 to 1 p.o. q.i.d. p.r.n.  5.  Neurontin 100 mg p.o. t.i.d.   Pain level 5/10 on average going from 2-7.  Pain improves with heat and  medication, made worse with walking, bending and sitting but particularly  when she bends forward and getting straightened back out, this is the worst.   REVIEW OF SYMPTOMS:  Positive for numbness and weakness occasionally in the  thighs but mainly in the buttock area.   PHYSICAL EXAMINATION:  VITAL SIGNS:  Blood pressure 158/80, pulse 94,  respiratory rate 16, O2 saturations 98% on room air.  GAIT:  She walks in a forward bent position at the hips and gradually  straightens out after she stands and walks for a while.  NEUROLOGIC:  Affect is bright and alert, appearance normal.   Faber's testing shows positive pain left SI area, equivocal on the right  side. She has mild tenderness to palpation with bilateral PSI in this area  bilaterally as well as bilateral gluteus medius and less so over the left  greater trochanter.   She has full strength in the bilateral lower extremities.   IMPRESSION:  Sacroiliac joint arthropathy most likely due to her psoriatic  arthritis which appears to be flared up at the current time.  Given she had  no good effect from a nonfluoroscopically guided sacroiliac joint injection  will schedule her for a  fluoroscopically guided injection first on the left  side and if this proves effective for her pain would repeat on the right  side.  In the meantime, she continued her hydrocodone 10/650, 1/2 tablet  b.i.d. also continue the Flexeril 5 t.i.d. and the Neurontin 100 p.o. t.i.d.      Andr   AEK/MedQ  D:  04/04/2004 13:18:28  T:  04/04/2004 14:04:55  Job #:  [id number]   cc:   Pollyann Savoy, M.D.  [address]  [place], [place] [address]  Fax: [phone]

## 2010-08-18 NOTE — Procedures (Signed)
[facility]  Patient:    Tonya Hall, Tonya Hall                        MRN: [id number] Proc. Date: 11/08/00 Adm. Date:  [id number] Attending:  Thyra Breed CC:         Julio Sicks, M.D.  Carylon Perches, M.D.   Procedure Report  PROCEDURE:  Transforaminal epidural steroid injection at L2.  DIAGNOSIS:  Degenerative disk disease with extraforaminal protrusion to the left at L2 associated with left hip pain and anterior thigh pain.  INTERVAL HISTORY:  The patient has noted lessening of her pain but only to a mild extent.  Nevertheless, she feels so much better that she is willing to go ahead and proceed with another transforaminal injection today.  Her medications are unchanged.  PHYSICAL EXAMINATION:  The patient is afebrile with vital signs stable. Please see flow sheet.  Her physical exam is unchanged.  DESCRIPTION OF PROCEDURE:  After informed consent was obtained, the patient was placed in the fluoroscopy suite in a prone position with a pillow under her abdomen and monitored.  Using fluoroscopic guidance, I optimized utilization of the L2-3 disk space, squaring up the vertebrae.  I next positioned the beam in an oblique position to visualize the facet joint optimally.  I marked the 6 oclock position at the pedicle over the skin.  The area was prepped with Betadine x 3 and draped.  Using a 25 gauge needle, I anesthetized the skin and subcutaneous structures with 1 cc of 1% lidocaine. A 22 gauge Chiba needle was introduced down to the pedicle at 6 oclock, contacting the inferior margin of the pedicle without contacting the nerve. Aspiration was negative for blood and CSF.  I injected 1 cc of contrast with good ______ .  This was followed by 1 cc of 1% lidocaine mixed with 40 mg of Medrol.  The needle was flushed with 1% lidocaine and removed intact.  POSTPROCEDURE CONDITION:  The patient noted that her pain was markedly improved.  Her vital signs were  stable.  DISCHARGE INSTRUCTIONS: 1. Resume previous diet. 2. Limitations and activities per instruction sheet, as outlined by my    assistant today. 3. Continue on current medications. 4. Follow up with me in 10-14 days for a third injection if needed. DD:  11/08/00 TD:  11/09/00 Job: [id number]

## 2010-08-18 NOTE — Assessment & Plan Note (Signed)
MEDICAL RECORD NUMBER:  [id number].   REASON FOR EVALUATION:  Ms. Derringer returns today after I saw her on November 16, 2003. She was started on Neurontin at 100 mg three times a day. She has  noticed quite a bit of relief in electrical sensation down the left leg  towards the knee. She is taking hydrocodone 1/2 tablet 1 or 2 times a day,  but she only needs to do this once or twice a week. She is no longer on any  type of Kadian or long-acting narcotic analgesic medication. She is on a  scheduled dosage of Flexeril 5 mg 3 times a day. She continues the Mobic 15  mg p.o. q.d.   She has no weakness. No loss of bowel or bladder function.   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   PAST SURGICAL HISTORY:  Bilateral laminectomy, L4-5, bilateral diskectomy,  lysis of adhesions,  L4-5 interbody fusion with pedicle screws L4-5, Hilda Lias, M.D., 2003.   PHYSICAL EXAMINATION:  Pain radiating 3/10 on average. Blood pressure  149/79, pulse 91, respirations 18, O2 saturation 100% on room air. In  general, no acute distress. Mood and affect appropriate. Back has no  tenderness to palpation. She has 75% forward flexion, 25% extension.  Extension hurts more than flexion. She has Faber's that is equivocal  bilaterally. Has some pain mostly in the knee or thigh area rather than in  the hip or back.   Deep tendon reflexes are normal. Gait is normal. She is able to heel walk  and toe walk.   IMPRESSION:  1.  Lumbar post laminectomy syndrome. Neuropathic component of pain      benefiting from Neurontin at a relatively low dose. We will increase her      to 100 mg q.i.d.  2.  Myofascial pain appears improved. We will see how she does off the      Flexeril and have asked her to come off it for 1 to 2 weeks and if her      pain starts worsening to resume.  3.  I do not think she needs any facet joint injections at this time.      Overall, her pain management has improved and only taking a  minimal      doses of short-acting narcotic analgesic medication.       AEK/MedQ  D:  12/21/2003 13:21:27  T:  12/22/2003 14:16:09  Job #:  [id number]   cc:   Pollyann Savoy, M.D.  [address]  [place], [place] [address]  Fax: [phone]

## 2010-08-23 ENCOUNTER — Ambulatory Visit: Payer: Commercial Managed Care - PPO | Admitting: Gastroenterology

## 2010-08-24 ENCOUNTER — Encounter (HOSPITAL_COMMUNITY): Payer: 59 | Attending: Internal Medicine

## 2010-08-24 DIAGNOSIS — M810 Age-related osteoporosis without current pathological fracture: Secondary | ICD-10-CM | POA: Insufficient documentation

## 2010-08-25 ENCOUNTER — Other Ambulatory Visit: Payer: Self-pay

## 2010-08-25 ENCOUNTER — Other Ambulatory Visit: Payer: Self-pay | Admitting: Urgent Care

## 2010-08-25 DIAGNOSIS — K52832 Lymphocytic colitis: Secondary | ICD-10-CM

## 2010-08-25 MED ORDER — BUDESONIDE 3 MG PO CP24: 3.0000 mg | ORAL_CAPSULE | Freq: Every day | ORAL | Status: DC

## 2010-08-25 NOTE — Telephone Encounter (Signed)
Pt called and said she thought Tonya Burton, NP, sent Rx for Entocort to [facility] when she was here the last time. But she does not have refills. Can you send Rx to the pharmacy.

## 2010-08-25 NOTE — Telephone Encounter (Signed)
Rx sent to [facility]

## 2010-08-30 ENCOUNTER — Encounter (HOSPITAL_COMMUNITY): Payer: Commercial Managed Care - PPO

## 2010-09-12 ENCOUNTER — Encounter (HOSPITAL_COMMUNITY): Payer: 59

## 2010-09-19 ENCOUNTER — Encounter (HOSPITAL_COMMUNITY): Payer: 59 | Attending: Rheumatology

## 2010-09-19 DIAGNOSIS — M810 Age-related osteoporosis without current pathological fracture: Secondary | ICD-10-CM | POA: Insufficient documentation

## 2010-09-20 ENCOUNTER — Encounter: Payer: Self-pay | Admitting: Gastroenterology

## 2010-09-20 ENCOUNTER — Ambulatory Visit (INDEPENDENT_AMBULATORY_CARE_PROVIDER_SITE_OTHER): Payer: 59 | Admitting: Gastroenterology

## 2010-09-20 DIAGNOSIS — K52832 Lymphocytic colitis: Secondary | ICD-10-CM

## 2010-09-20 DIAGNOSIS — R1310 Dysphagia, unspecified: Secondary | ICD-10-CM

## 2010-09-20 DIAGNOSIS — K5289 Other specified noninfective gastroenteritis and colitis: Secondary | ICD-10-CM

## 2010-09-20 NOTE — Progress Notes (Signed)
Subjective:    Patient ID: Tonya Hall, female    DOB: [birth year], 60 y.o.   MRN: [id number]  PCP: Ouida Sills  1o:DEVESHWAR  HPI Seen in FEB 2012 w/ diarrhea TCS: microscopic colitis. Responded to Entocort. Bms: 1-2 times, soft. Rare rumbles. No problems w/ swallowing. Get choked more easily on liquid and certain dry things. Eating pound cake and it made her cough. Getting choked more often and it concerns her. Sx worse over the past 2 years and worse when she's tired. MOTHER HAD A ZENKER'S DIVERTICULUM.  Past Medical History  Diagnosis Date  . Lymphocytic colitis 05/26/10 TCS RMR    Responded to Entocort x 3 MOS  . Psoriatic arthritis     Dr. Anthonette Legato  . Degenerative disk disease   . HTN (hypertension)   . Raynaud's disease   . Seasonal allergies   . GERD (gastroesophageal reflux disease)   . Osteoporosis   . Hypokalemia    Past Surgical History  Procedure Date  . Cesarean section     2  . Tonsillectomy and adenoidectomy   . Lumbar disc surgery   . Lumbar fusion   . Dilation and curettage of uterus     spontaneous abortion  . Colonoscopy 2012 diarrhea    microscopic colitis  . Colonoscopy 2010 screening    [place]  Tics, Sml IH  . Upper gastrointestinal endoscopy MAR 2010 DYSPEPSIA    REACTIVE GASTROPATHY   Allergies  Allergen Reactions  . Adalimumab     FEVER  . Methotrexate   . Morphine   . Sulfonamide Derivatives    Current Outpatient Prescriptions  Medication Sig Dispense Refill  . aspirin 81 MG tablet Take 81 mg by mouth daily.        Marland Kitchen BIOTIN FORTE PO Take 1 tablet by mouth daily.        . calcium carbonate (OS-CAL) 600 MG TABS Take 600 mg by mouth 2 (two) times daily with a meal.        . Cholecalciferol (VITAMIN D) 2000 UNITS CAPS Take 1 capsule by mouth daily.        . cyclobenzaprine (FLEXERIL) 5 MG tablet Take 5 mg by mouth 3 (three) times daily as needed.        Marland Kitchen esomeprazole (NEXIUM) 20 MG capsule Take 20 mg by mouth daily before breakfast.        .  fish oil-omega-3 fatty acids 1000 MG capsule Take 2 g by mouth daily.        . Ginger, Zingiber officinalis, (GINGER ROOT) 500 MG CAPS Take 1 capsule by mouth daily.        . Glucosamine 500 MG TABS Take 4 tablets by mouth daily.        Marland Kitchen ibandronate (BONIVA) 3 MG/3ML SOLN injection Inject 3 mg into the vein every 3 (three) months.        . InFLIXimab (REMICADE IV) Inject 3 mg/kg into the vein every 8 (eight) weeks.        Marland Kitchen leflunomide (ARAVA) 20 MG tablet Take 20 mg by mouth daily.        . metoprolol succinate (TOPROL-XL) 25 MG 24 hr tablet Take 25 mg by mouth daily.        . Multiple Vitamin (MULTIVITAMIN) tablet Take 1 tablet by mouth daily.        . Nutritional Supplements (CHOICE DM FIBER-BURST PO) Take 2 capsules by mouth daily.        . potassium chloride (KLOR-CON) 20  MEQ packet Take 20 mEq by mouth daily.        . predniSONE (DELTASONE) 5 MG tablet Take 5 mg by mouth daily.        . simvastatin (ZOCOR) 40 MG tablet Take 20 mg by mouth at bedtime.       Marland Kitchen spironolactone (ALDACTONE) 25 MG tablet Take 25 mg by mouth daily.        . Turmeric 500 MG CAPS Take 1 capsule by mouth daily.         Family History  Problem Relation Age of Onset  . Rheum arthritis Mother   . Diabetes Mother   . Rheumatic fever Father   . Colon cancer Neg Hx   . Colon polyps Neg Hx    History   Social History  . Marital Status: Married    Spouse Name: N/A    Number of Children: N/A  . Years of Education: N/A   Occupational History  . Case Mgr RN [employer]   Social History Main Topics  . Smoking status: Never Smoker   . Smokeless tobacco: Never Used  . Alcohol Use: No  . Drug Use: No  . Sexually Active: Yes -- Female partner(s)    Birth Control/ Protection: None     spouse   Other Topics Concern  . Not on file   Social History Narrative  . No narrative on file     Review of Systems  Musculoskeletal: Positive for joint swelling (and joint pain).  All other systems  reviewed and are negative.       Objective:   Physical Exam  Vitals reviewed. Constitutional: She is oriented to person, place, and time. She appears well-developed and well-nourished. No distress.  HENT:  Head: Normocephalic and atraumatic.  Mouth/Throat: Oropharynx is clear and moist. No oropharyngeal exudate.  Eyes: Pupils are equal, round, and reactive to light.  Cardiovascular: Normal rate, regular rhythm and normal heart sounds.   Pulmonary/Chest: Effort normal and breath sounds normal.  Abdominal: Soft. Bowel sounds are normal. She exhibits no distension. There is no tenderness.  Musculoskeletal: She exhibits no edema.  Neurological: She is alert and oriented to person, place, and time.  Psychiatric: She has a normal mood and affect.          Assessment & Plan:

## 2010-09-21 ENCOUNTER — Encounter: Payer: Self-pay | Admitting: Gastroenterology

## 2010-09-21 ENCOUNTER — Other Ambulatory Visit: Payer: Self-pay | Admitting: Gastroenterology

## 2010-09-21 DIAGNOSIS — R1310 Dysphagia, unspecified: Secondary | ICD-10-CM | POA: Insufficient documentation

## 2010-09-21 NOTE — Progress Notes (Signed)
Cc to PCP 

## 2010-09-21 NOTE — Assessment & Plan Note (Signed)
UNCLEAR IF PT HAS OROPHARYNGEAL DYSPHAGIA OR ESOPHAGEAL DYSPHAGIA. Differential diagnosis includes HTN UES, 1o neuromuscular d/o, Zenker's diverticulum, or esophageal dysphagia 2o to GERD, stricture, and less likely 1o EMD.  Schedule MBS followed by BPE to evaluate her dysphagia. If abnl MBS, refer to Neuro. If abnl BPE, EGD/dil or refer to [facility] for esophageal manometry. OPV in 4 mos.

## 2010-09-21 NOTE — Assessment & Plan Note (Signed)
RESOLVED. 

## 2010-09-21 NOTE — Progress Notes (Signed)
Reminder in epic to follow up in 4 months °

## 2010-10-02 ENCOUNTER — Other Ambulatory Visit (HOSPITAL_COMMUNITY): Payer: 59

## 2010-10-02 ENCOUNTER — Ambulatory Visit (HOSPITAL_COMMUNITY)
Admission: RE | Admit: 2010-10-02 | Discharge: 2010-10-02 | Disposition: A | Discharge status: Home / Self Care | Payer: 59 | Source: Ambulatory Visit | Attending: Gastroenterology | Admitting: Gastroenterology

## 2010-10-02 DIAGNOSIS — R1310 Dysphagia, unspecified: Secondary | ICD-10-CM | POA: Insufficient documentation

## 2010-10-02 DIAGNOSIS — K224 Dyskinesia of esophagus: Secondary | ICD-10-CM | POA: Insufficient documentation

## 2010-10-09 NOTE — Progress Notes (Signed)
Cc report to Dr. Ouida Sills and f/u opv is in the computer

## 2010-10-24 ENCOUNTER — Other Ambulatory Visit (HOSPITAL_COMMUNITY): Payer: Self-pay | Admitting: Internal Medicine

## 2010-10-24 ENCOUNTER — Ambulatory Visit (HOSPITAL_COMMUNITY)
Admission: RE | Admit: 2010-10-24 | Discharge: 2010-10-24 | Disposition: A | Discharge status: Home / Self Care | Payer: 59 | Source: Ambulatory Visit | Attending: Internal Medicine | Admitting: Internal Medicine

## 2010-10-24 DIAGNOSIS — R059 Cough, unspecified: Secondary | ICD-10-CM | POA: Insufficient documentation

## 2010-10-24 DIAGNOSIS — R05 Cough: Secondary | ICD-10-CM | POA: Insufficient documentation

## 2010-10-27 ENCOUNTER — Encounter: Payer: Self-pay | Admitting: Gastroenterology

## 2010-10-31 ENCOUNTER — Encounter (HOSPITAL_COMMUNITY): Payer: 59 | Attending: Rheumatology

## 2010-10-31 DIAGNOSIS — M810 Age-related osteoporosis without current pathological fracture: Secondary | ICD-10-CM | POA: Insufficient documentation

## 2010-11-15 ENCOUNTER — Encounter (HOSPITAL_COMMUNITY): Payer: 59

## 2010-11-22 ENCOUNTER — Other Ambulatory Visit: Payer: Self-pay | Admitting: Obstetrics & Gynecology

## 2010-11-22 ENCOUNTER — Encounter (HOSPITAL_COMMUNITY): Payer: 59

## 2010-11-22 DIAGNOSIS — Z139 Encounter for screening, unspecified: Secondary | ICD-10-CM

## 2010-11-23 ENCOUNTER — Encounter (HOSPITAL_COMMUNITY): Payer: 59 | Attending: Oncology

## 2010-11-23 DIAGNOSIS — M810 Age-related osteoporosis without current pathological fracture: Secondary | ICD-10-CM

## 2010-11-23 DIAGNOSIS — M818 Other osteoporosis without current pathological fracture: Secondary | ICD-10-CM | POA: Insufficient documentation

## 2010-11-23 MED ORDER — SODIUM CHLORIDE 0.9 % IJ SOLN
10.0000 mL | Freq: Once | INTRAMUSCULAR | Status: AC
  Administered 2010-11-23: 10 mL via INTRAVENOUS

## 2010-11-23 MED ORDER — IBANDRONATE SODIUM 3 MG/3ML IV SOLN
3.0000 mg | Freq: Once | INTRAVENOUS | Status: AC
  Administered 2010-11-23: 3 mg via INTRAVENOUS

## 2010-11-23 MED ORDER — IBANDRONATE SODIUM 3 MG/3ML IV SOLN
INTRAVENOUS | Status: AC
  Administered 2010-11-23: 3 mg via INTRAVENOUS
  Filled 2010-11-23: qty 3

## 2010-11-23 MED ORDER — SODIUM CHLORIDE 0.9 % IJ SOLN
INTRAMUSCULAR | Status: AC
  Administered 2010-11-23: 10 mL via INTRAVENOUS
  Filled 2010-11-23: qty 20

## 2010-11-23 NOTE — Progress Notes (Signed)
Tonya Hall presents today for injection per MD orders. boniva 3mg  administered IV in left AC vein over 30 seconds. Flushed pre and post with 10cc normal saline Administration without incident. Patient tolerated well.

## 2010-11-27 ENCOUNTER — Ambulatory Visit (HOSPITAL_COMMUNITY)
Admission: RE | Admit: 2010-11-27 | Discharge: 2010-11-27 | Disposition: A | Discharge status: Home / Self Care | Payer: 59 | Source: Ambulatory Visit | Attending: Obstetrics & Gynecology | Admitting: Obstetrics & Gynecology

## 2010-11-27 DIAGNOSIS — Z139 Encounter for screening, unspecified: Secondary | ICD-10-CM

## 2010-11-27 DIAGNOSIS — Z1231 Encounter for screening mammogram for malignant neoplasm of breast: Secondary | ICD-10-CM | POA: Insufficient documentation

## 2010-12-12 ENCOUNTER — Encounter (HOSPITAL_COMMUNITY)
Admission: RE | Admit: 2010-12-12 | Discharge: 2010-12-12 | Disposition: A | Discharge status: Home / Self Care | Payer: 59 | Source: Ambulatory Visit | Attending: Rheumatology | Admitting: Rheumatology

## 2010-12-12 DIAGNOSIS — M810 Age-related osteoporosis without current pathological fracture: Secondary | ICD-10-CM | POA: Insufficient documentation

## 2011-01-09 LAB — POCT I-STAT 4, (NA,K, GLUC, HGB,HCT)
Glucose, Bld: 102 — ABNORMAL HIGH
HCT: 34 — ABNORMAL LOW
Hemoglobin: 11.6 — ABNORMAL LOW
Operator id: 181601
Potassium: 3.5
Sodium: 141

## 2011-01-10 LAB — CBC
HCT: 39.1
Hemoglobin: 13.5
MCHC: 34.6
MCV: 93.6
Platelets: 239
RBC: 4.18
RDW: 12.8
WBC: 8

## 2011-01-10 LAB — TYPE AND SCREEN
ABO/RH(D): AB POS
Antibody Screen: NEGATIVE

## 2011-01-10 LAB — BASIC METABOLIC PANEL
BUN: 19
CO2: 26
Calcium: 9.4
Chloride: 105
Creatinine, Ser: 0.75
GFR calc Af Amer: >60
GFR calc non Af Amer: >60
Glucose, Bld: 100 — ABNORMAL HIGH
Potassium: 3.3 — ABNORMAL LOW
Sodium: 137

## 2011-01-10 LAB — ABO/RH: ABO/RH(D): AB POS

## 2011-01-23 ENCOUNTER — Encounter (HOSPITAL_COMMUNITY): Payer: 59 | Attending: Rheumatology

## 2011-01-23 DIAGNOSIS — M810 Age-related osteoporosis without current pathological fracture: Secondary | ICD-10-CM | POA: Insufficient documentation

## 2011-02-02 ENCOUNTER — Telehealth: Payer: Self-pay | Admitting: Urgent Care

## 2011-02-02 ENCOUNTER — Ambulatory Visit: Payer: 59 | Admitting: Urgent Care

## 2011-02-02 NOTE — Telephone Encounter (Signed)
Please send missed appt letter to reschedule Thanks

## 2011-02-02 NOTE — Telephone Encounter (Signed)
Pt was a no show

## 2011-02-06 ENCOUNTER — Encounter: Payer: Self-pay | Admitting: Gastroenterology

## 2011-02-06 NOTE — Telephone Encounter (Signed)
Letter was mailed for patient to call and set up OV °

## 2011-02-14 ENCOUNTER — Encounter: Payer: Self-pay | Admitting: Urgent Care

## 2011-02-14 ENCOUNTER — Ambulatory Visit (INDEPENDENT_AMBULATORY_CARE_PROVIDER_SITE_OTHER): Payer: 59 | Admitting: Urgent Care

## 2011-02-14 DIAGNOSIS — K224 Dyskinesia of esophagus: Secondary | ICD-10-CM

## 2011-02-14 DIAGNOSIS — R1310 Dysphagia, unspecified: Secondary | ICD-10-CM

## 2011-02-14 DIAGNOSIS — R05 Cough: Secondary | ICD-10-CM

## 2011-02-14 DIAGNOSIS — K219 Gastro-esophageal reflux disease without esophagitis: Secondary | ICD-10-CM

## 2011-02-14 DIAGNOSIS — R059 Cough, unspecified: Secondary | ICD-10-CM

## 2011-02-14 DIAGNOSIS — D539 Nutritional anemia, unspecified: Secondary | ICD-10-CM

## 2011-02-14 NOTE — Assessment & Plan Note (Signed)
See EMD

## 2011-02-14 NOTE — Assessment & Plan Note (Addendum)
Nonspecific. No gross aspiration. Offered to consult at [facility], patient would like followup with pulmonology prior to pursuing referral given chronic cough.  Please follow up with Dr. Ouida Sills regarding your chronic cough and possible reactive airway disease Call me back with your Nexium dosage-I would like you on 40 mg twice a day to suppress acid Use albuterol when necessary for cough, not just shortness of breath. If you decide you want referral to [facility] for esophageal motility disorder, please call the back.

## 2011-02-14 NOTE — Progress Notes (Signed)
Referring Provider: Carylon Perches, MD Primary Care Physician:  Carylon Perches, MD Primary Gastroenterologist:  Dr. Darrick Penna  Chief Complaint  Patient presents with  . Follow-up    Esophageal motility disorder, history of microscopic colitis    HPI:  ARYANNAH MOHON is a 60 y.o. female here for follow up for nonspecific esophageal motility disorder.  Previous workup has included speech pathology evaluation with barium study and pill study. She tells me she still has some problems problems w/ crackers & cookies, almonds,  pooling in mouth & coughing spells when she eats the foods.  She also has intermittent coughing spells throughout the day, sometimes several days and aerobic. She is now on BID Nexium but is unsure of whether she is taking 20 or 40 mg.  Rare heartburn or indigestion. She denies anorexia or weight loss. She does have an albuterol inhaler, but she has rarely been using it. She had a CT scan 4 months ago which showed central peribronchial thickening which is minimal possible asthma reactive airway disease with broad colitis. She gives history of having allergies and chronic bronchitis as a child.  She has history of microscopic colitis which responded well to Entocort. She is not having a problems with her bowels at this time. She rarely has loose stools.  10/24/10 CXR->Minimal central peribronchial thickening. This may be associated with bronchitis, asthma, and reactive airway disease. No pneumonia, peripheral infiltrate, or consolidation is seen. Past Medical History  Diagnosis Date  . Lymphocytic colitis 05/26/10 TCS RMR    Responded to Entocort x 3 MOS  . Psoriatic arthritis     Dr. Anthonette Legato  . Degenerative disk disease   . HTN (hypertension)   . Raynaud's disease   . Seasonal allergies   . GERD (gastroesophageal reflux disease)   . Osteoporosis   . Hypokalemia   . Esophageal motility disorder     Non-specific, see modified barium study/speech path, BP   Past Surgical History    Procedure Date  . Cesarean section     2  . Tonsillectomy and adenoidectomy   . Lumbar disc surgery   . Lumbar fusion   . Dilation and curettage of uterus     spontaneous abortion  . Colonoscopy 2012 diarrhea    microscopic colitis  . Colonoscopy 2010 screening    [place]  Tics, Sml IH  . Upper gastrointestinal endoscopy MAR 2010 DYSPEPSIA    REACTIVE GASTROPATHY   Current Outpatient Prescriptions  Medication Sig Dispense Refill  . ALBUTEROL IN Inhale into the lungs as needed.        Marland Kitchen aspirin 81 MG tablet Take 81 mg by mouth daily.        Marland Kitchen BIOTIN FORTE PO Take 1 tablet by mouth daily.        . calcium carbonate (OS-CAL) 600 MG TABS Take 600 mg by mouth 2 (two) times daily with a meal.        . Cholecalciferol (VITAMIN D) 2000 UNITS CAPS Take 1 capsule by mouth daily.        . cyclobenzaprine (FLEXERIL) 5 MG tablet Take 5 mg by mouth 3 (three) times daily as needed.        Marland Kitchen esomeprazole (NEXIUM) 20 MG capsule Take 20 mg by mouth 2 (two) times daily.       . fish oil-omega-3 fatty acids 1000 MG capsule Take 2 g by mouth daily.        . Ginger, Zingiber officinalis, (GINGER ROOT) 500 MG CAPS Take 1 capsule  by mouth daily.        . Glucosamine 500 MG TABS Take 4 tablets by mouth daily.        Marland Kitchen ibandronate (BONIVA) 3 MG/3ML SOLN injection Inject 3 mg into the vein every 3 (three) months.        . leflunomide (ARAVA) 20 MG tablet Take 10 mg by mouth daily.       . metoprolol succinate (TOPROL-XL) 25 MG 24 hr tablet Take 25 mg by mouth daily.        . Multiple Vitamin (MULTIVITAMIN) tablet Take 1 tablet by mouth daily.        . Nutritional Supplements (CHOICE DM FIBER-BURST PO) Take 2 capsules by mouth daily.        . potassium chloride (KLOR-CON) 20 MEQ packet Take 20 mEq by mouth daily.        . predniSONE (DELTASONE) 5 MG tablet Take 5 mg by mouth daily.        . simvastatin (ZOCOR) 40 MG tablet Take 20 mg by mouth at bedtime.       Marland Kitchen spironolactone (ALDACTONE) 25 MG tablet Take 25 mg  by mouth daily.        . traMADol (ULTRAM) 50 MG tablet Take 50 mg by mouth every 6 (six) hours as needed. Maximum dose= 8 tablets per day       . Turmeric 500 MG CAPS Take 1 capsule by mouth daily.        . InFLIXimab (REMICADE IV) Inject 3 mg/kg into the vein every 8 (eight) weeks.         Allergies as of 02/14/2011 - Review Complete 02/14/2011  Allergen Reaction Noted  . Adalimumab    . Methotrexate    . Morphine    . Sulfonamide derivatives     Review of Systems: Gen: Denies any fever, chills, sweats, anorexia, fatigue, weakness, malaise, weight loss, and sleep disorder CV: Denies chest pain, angina, palpitations, syncope, orthopnea, PND, peripheral edema, and claudication. Resp:see history of present illness  GI: Denies vomiting blood, jaundice, and fecal incontinence.  Denies odynophagia. Derm: Denies rash, itching, dry skin, hives, moles, warts, or unhealing ulcers.  Psych: Denies depression, anxiety, memory loss, suicidal ideation, hallucinations, paranoia, and confusion. Heme: Denies bruising, bleeding, and enlarged lymph nodes Musculoskeletal: She is having some joint pain and deformity-sees rheumatology.Marland Kitchen  Physical Exam: BP 114/63  Pulse 84  Temp(Src) 97.6 F (36.4 C) (Temporal)  Ht 4\' 11"  (1.499 m)  Wt 144 lb 6.4 oz (65.499 kg)  BMI 29.17 kg/m2 General:   Alert,  Well-developed, well-nourished, pleasant and cooperative in NAD. Head:  Normocephalic and atraumatic. Eyes:  Sclera clear, no icterus.   Conjunctiva pink. Mouth:  No deformity or lesions, oropharynx pink and moist. Neck:  Supple; no masses or thyromegaly. Heart:  Regular rate and rhythm; no murmurs, clicks, rubs,  or gallops. Abdomen:  Soft, nontender and nondistended. No masses, hepatosplenomegaly or hernias noted. Normal bowel sounds, without guarding, and without rebound.   Msk:  + Chronic arthritic changes and deformities to multiple small joints. Pulses:  Normal pulses noted. Extremities:  Without  edema. Neurologic:  Alert and  oriented x4;  grossly normal neurologically. Skin:  Intact without significant lesions or rashes. Cervical Nodes:  No significant cervical adenopathy. Psych:  Alert and cooperative. Normal mood and affect.

## 2011-02-14 NOTE — Assessment & Plan Note (Signed)
Denies typical symptoms. On twice a day PPI. May have some symptoms of LPR. I suspect she may have an element of reactive airway disease as well as esophageal motility disorder.

## 2011-02-14 NOTE — Patient Instructions (Signed)
Please follow up with Dr. Ouida Sills regarding your chronic cough and possible reactive airway disease Call me back with your Nexium dosage-I would like you on 40 mg twice a day to suppress acid Use albuterol when necessary for cough, not just shortness of breath. If you decide you want referral to [facility] for esophageal motility disorder, please call the back.

## 2011-02-14 NOTE — Assessment & Plan Note (Signed)
Suspect reactive airway disease, plus possible LPR/EMD component.

## 2011-02-15 ENCOUNTER — Encounter (HOSPITAL_COMMUNITY): Payer: 59 | Attending: Rheumatology

## 2011-02-15 VITALS — BP 128/75 | HR 77 | Temp 97.3°F

## 2011-02-15 DIAGNOSIS — M949 Disorder of cartilage, unspecified: Secondary | ICD-10-CM | POA: Insufficient documentation

## 2011-02-15 DIAGNOSIS — M899 Disorder of bone, unspecified: Secondary | ICD-10-CM | POA: Insufficient documentation

## 2011-02-15 DIAGNOSIS — M8580 Other specified disorders of bone density and structure, unspecified site: Secondary | ICD-10-CM

## 2011-02-15 MED ORDER — IBANDRONATE SODIUM 3 MG/3ML IV SOLN
3.0000 mg | Freq: Once | INTRAVENOUS | Status: AC
  Administered 2011-02-15: 3 mg via INTRAVENOUS

## 2011-02-15 NOTE — Progress Notes (Signed)
Cc to PCP 

## 2011-02-15 NOTE — Progress Notes (Signed)
Tonya Hall presents today for injection per MD orders. Boniva 3mg  administered IV push left antecubital via 23g butterfly by W.[place]ulton,RN. Administration without incident. Patient tolerated well.

## 2011-02-19 ENCOUNTER — Telehealth: Payer: Self-pay

## 2011-02-19 NOTE — Telephone Encounter (Signed)
Thanks

## 2011-02-19 NOTE — Telephone Encounter (Signed)
Pt called to say that Dr. Ouida Sills has her on Nexium 40 mg bid. Entered under the meds.

## 2011-03-06 ENCOUNTER — Encounter (HOSPITAL_COMMUNITY): Payer: 59

## 2011-05-18 ENCOUNTER — Encounter (HOSPITAL_COMMUNITY): Payer: 59 | Attending: Internal Medicine

## 2011-05-18 DIAGNOSIS — M899 Disorder of bone, unspecified: Secondary | ICD-10-CM | POA: Insufficient documentation

## 2011-05-18 DIAGNOSIS — M8580 Other specified disorders of bone density and structure, unspecified site: Secondary | ICD-10-CM

## 2011-05-18 DIAGNOSIS — M949 Disorder of cartilage, unspecified: Secondary | ICD-10-CM | POA: Insufficient documentation

## 2011-05-18 MED ORDER — IBANDRONATE SODIUM 3 MG/3ML IV SOLN
3.0000 mg | Freq: Once | INTRAVENOUS | Status: AC
  Administered 2011-05-18: 3 mg via INTRAVENOUS

## 2011-05-18 NOTE — Progress Notes (Signed)
Tonya Hall presents today for IV injection per MD orders.  Boniva 3mg  administered IV to left AC via butterfly needle; venous access confirmed with blood return.  Site flushed easily before and after administration of the medication.  Administration without incident.  Patient tolerated well.

## 2011-05-29 ENCOUNTER — Ambulatory Visit: Payer: 59 | Admitting: Pharmacist

## 2011-06-14 ENCOUNTER — Telehealth (HOSPITAL_COMMUNITY): Payer: Self-pay | Admitting: Dietician

## 2011-06-15 NOTE — Telephone Encounter (Signed)
Received referral from [facility] for dx: diabetes.

## 2011-07-03 ENCOUNTER — Ambulatory Visit (INDEPENDENT_AMBULATORY_CARE_PROVIDER_SITE_OTHER): Payer: Self-pay | Admitting: Pharmacist

## 2011-07-03 ENCOUNTER — Encounter: Payer: Self-pay | Admitting: Pharmacist

## 2011-07-03 VITALS — BP 123/81 | HR 81 | Ht 59.75 in | Wt 148.3 lb

## 2011-07-03 DIAGNOSIS — L4050 Arthropathic psoriasis, unspecified: Secondary | ICD-10-CM

## 2011-07-03 DIAGNOSIS — I10 Essential (primary) hypertension: Secondary | ICD-10-CM

## 2011-07-03 DIAGNOSIS — E7800 Pure hypercholesterolemia, unspecified: Secondary | ICD-10-CM

## 2011-07-03 DIAGNOSIS — M159 Polyosteoarthritis, unspecified: Secondary | ICD-10-CM

## 2011-07-03 NOTE — Progress Notes (Signed)
  Subjective:    Patient ID: Tonya Hall, female    DOB: 04-May-1950, 61 y.o.   MRN: [id number]  HPI Patient arrives in good spirits for medication review.  Reports seeing Carylon Perches as primary care provider, Dr. Titus Dubin as rheumatologist, Dr. Grant Ruts Endocrinologist (osteoporosis).   Reports being diagnosed with psoriatic arthritis since late 1990s or early 2000s  and states she is under fair level of control.      Review of Systems     Objective:   Physical Exam        Assessment & Plan:  Following medication review, no suggestions for change.  Complete medication list provided to patient.  Total time in face to face medication review: 40 minutes.  Patient seen with: Tana Conch, PharmD, Pharmacy Resident.

## 2011-07-03 NOTE — Progress Notes (Signed)
  Subjective:    Patient ID: Tonya Hall, female    DOB: 07-Sep-1950, 62 y.o.   MRN: [id number]  HPI  Reviewed and agree with Dr. Macky Lower management.    Review of Systems     Objective:   Physical Exam        Assessment & Plan:

## 2011-07-03 NOTE — Assessment & Plan Note (Signed)
Following medication review, no suggestions for change.  Complete medication list provided to patient.  Total time in face to face medication review: 40 minutes.  Patient seen with: Tana Conch, PharmD, Pharmacy Resident.

## 2011-07-04 NOTE — Telephone Encounter (Signed)
Referral filed

## 2011-08-01 ENCOUNTER — Encounter: Payer: Self-pay | Admitting: Internal Medicine

## 2011-08-15 ENCOUNTER — Ambulatory Visit (HOSPITAL_COMMUNITY): Payer: 59

## 2011-09-18 ENCOUNTER — Ambulatory Visit (HOSPITAL_COMMUNITY): Payer: 59

## 2011-09-19 ENCOUNTER — Other Ambulatory Visit (HOSPITAL_COMMUNITY): Payer: Self-pay | Admitting: Rheumatology

## 2011-09-19 ENCOUNTER — Ambulatory Visit (HOSPITAL_COMMUNITY): Payer: 59

## 2011-09-19 ENCOUNTER — Ambulatory Visit (HOSPITAL_COMMUNITY)
Admission: RE | Admit: 2011-09-19 | Discharge: 2011-09-19 | Disposition: A | Discharge status: Home / Self Care | Payer: 59 | Source: Ambulatory Visit | Attending: Rheumatology | Admitting: Rheumatology

## 2011-09-19 DIAGNOSIS — R0989 Other specified symptoms and signs involving the circulatory and respiratory systems: Secondary | ICD-10-CM

## 2011-09-25 ENCOUNTER — Encounter (HOSPITAL_COMMUNITY): Payer: 59 | Attending: Internal Medicine

## 2011-09-25 DIAGNOSIS — M159 Polyosteoarthritis, unspecified: Secondary | ICD-10-CM | POA: Insufficient documentation

## 2011-09-25 MED ORDER — IBANDRONATE SODIUM 3 MG/3ML IV SOLN
3.0000 mg | Freq: Once | INTRAVENOUS | Status: AC
  Administered 2011-09-25: 3 mg via INTRAVENOUS

## 2011-09-25 NOTE — Progress Notes (Signed)
IV push completed, patient tolerated well.

## 2011-12-26 ENCOUNTER — Ambulatory Visit (HOSPITAL_COMMUNITY): Payer: 59

## 2012-01-03 ENCOUNTER — Encounter (HOSPITAL_COMMUNITY): Payer: 59 | Attending: Internal Medicine

## 2012-01-03 VITALS — BP 124/74 | HR 76 | Temp 97.3°F | Resp 18

## 2012-01-03 DIAGNOSIS — M159 Polyosteoarthritis, unspecified: Secondary | ICD-10-CM

## 2012-01-03 DIAGNOSIS — M949 Disorder of cartilage, unspecified: Secondary | ICD-10-CM | POA: Insufficient documentation

## 2012-01-03 DIAGNOSIS — M899 Disorder of bone, unspecified: Secondary | ICD-10-CM | POA: Insufficient documentation

## 2012-01-03 MED ORDER — IBANDRONATE SODIUM 3 MG/3ML IV SOLN
3.0000 mg | Freq: Once | INTRAVENOUS | Status: AC
  Administered 2012-01-03: 3 mg via INTRAVENOUS

## 2012-01-03 MED ORDER — SODIUM CHLORIDE 0.9 % IJ SOLN
10.0000 mL | INTRAMUSCULAR | Status: DC | PRN
  Administered 2012-01-03: 10 mL via INTRAVENOUS

## 2012-01-03 NOTE — Progress Notes (Signed)
Boniva 3 mg IV push given via 23g butterfly left antecubital. Flushed with NS 10 ml. Tolerated well.

## 2012-01-16 ENCOUNTER — Other Ambulatory Visit (HOSPITAL_COMMUNITY): Payer: Self-pay | Admitting: Rheumatology

## 2012-01-16 DIAGNOSIS — Z8739 Personal history of other diseases of the musculoskeletal system and connective tissue: Secondary | ICD-10-CM

## 2012-01-22 ENCOUNTER — Other Ambulatory Visit (HOSPITAL_COMMUNITY): Payer: 59

## 2012-02-01 ENCOUNTER — Other Ambulatory Visit (HOSPITAL_COMMUNITY): Payer: Self-pay | Admitting: *Deleted

## 2012-02-01 DIAGNOSIS — IMO0001 Reserved for inherently not codable concepts without codable children: Secondary | ICD-10-CM

## 2012-02-11 ENCOUNTER — Ambulatory Visit (HOSPITAL_COMMUNITY)
Admission: RE | Admit: 2012-02-11 | Discharge: 2012-02-11 | Disposition: A | Discharge status: Home / Self Care | Payer: 59 | Source: Ambulatory Visit | Attending: Internal Medicine | Admitting: Internal Medicine

## 2012-02-11 DIAGNOSIS — IMO0001 Reserved for inherently not codable concepts without codable children: Secondary | ICD-10-CM

## 2012-02-11 DIAGNOSIS — Z1231 Encounter for screening mammogram for malignant neoplasm of breast: Secondary | ICD-10-CM | POA: Insufficient documentation

## 2012-04-25 ENCOUNTER — Encounter (HOSPITAL_COMMUNITY): Payer: 59 | Attending: Internal Medicine

## 2012-04-25 VITALS — BP 147/83 | HR 96

## 2012-04-25 DIAGNOSIS — M159 Polyosteoarthritis, unspecified: Secondary | ICD-10-CM | POA: Insufficient documentation

## 2012-04-25 MED ORDER — IBANDRONATE SODIUM 3 MG/3ML IV SOLN
3.0000 mg | Freq: Once | INTRAVENOUS | Status: AC
  Administered 2012-04-25: 3 mg via INTRAVENOUS

## 2012-04-25 NOTE — Progress Notes (Signed)
Tolerated injection well.  Boniva 3 mg /3 ml given IV in left AC.

## 2012-05-17 ENCOUNTER — Other Ambulatory Visit: Payer: Self-pay

## 2012-06-12 ENCOUNTER — Ambulatory Visit: Payer: 59 | Admitting: Urgent Care

## 2012-06-23 ENCOUNTER — Encounter: Payer: Self-pay | Admitting: Internal Medicine

## 2012-06-25 ENCOUNTER — Ambulatory Visit (INDEPENDENT_AMBULATORY_CARE_PROVIDER_SITE_OTHER): Payer: 59 | Admitting: Gastroenterology

## 2012-06-25 ENCOUNTER — Encounter: Payer: Self-pay | Admitting: Gastroenterology

## 2012-06-25 VITALS — BP 151/78 | HR 103 | Temp 98.4°F | Ht 59.5 in | Wt 157.0 lb

## 2012-06-25 DIAGNOSIS — K219 Gastro-esophageal reflux disease without esophagitis: Secondary | ICD-10-CM

## 2012-06-25 DIAGNOSIS — R635 Abnormal weight gain: Secondary | ICD-10-CM

## 2012-06-25 MED ORDER — PANTOPRAZOLE SODIUM 40 MG PO TBEC: 40.0000 mg | DELAYED_RELEASE_TABLET | Freq: Two times a day (BID) | ORAL | Status: DC

## 2012-06-25 NOTE — Patient Instructions (Signed)
Start pantoprazole 40 mg twice a day, 30 minutes before breakfast and 30 minutes before your evening meal. Prescription sent to her pharmacy. Please have your blood work done to check your thyroid. Call if your reflux symptoms do not improve.

## 2012-06-25 NOTE — Assessment & Plan Note (Signed)
Refractory GERD over the last few months. Only medication change has been initiation of Remicade about 4 weeks again. No longer on Enbrel. No alarm symptoms noted. No difficulty swallowing. Given that her formulary has changed, we will start pantoprazole 40 mg twice a day at first. Discussed with patient if she has significant improvement of her symptoms, after her 3 months she may go back to once daily dosing. If she does not note improvement, she will let us know and we will consider upper endoscopy at that time.

## 2012-06-25 NOTE — Progress Notes (Signed)
Primary Care Physician: Carylon Perches, MD  Primary Gastroenterologist:    Chief Complaint  Patient presents with  . Heartburn    HPI: Tonya Hall is a 61 y.o. female here for followup of refractory heartburn. She was last seen in November 2012. She also has a history of nonspecific esophageal motility disorder and history of microscopic colitis. Her last colonoscopy was in 2012 at which time she was on a microscopic colitis. Her last upper endoscopy was March 2010, she had reactive gastropathy the esophagus appeared normal.  She has been on Nexium 40mg  BID but 2 months ago started having breakthrough symptoms at least 3-4 days per week. Some days have all morning. Rolaids not helping. By lunch time little better. Rare nocturnal symptoms. Just stopped Enbrel for few months. Remicade dose number 2 for psoriatic arthritis. No new medications changes otherwise. No dysphagia, vomiting, abdominal pain, diarrhea, constipation, melena, rectal bleeding. She complains of tinnitus 15 pound weight gain over the last one to 2 years which she contributes to less ambulation at work.   She notes that hospital formulary no longer includes Nexium.   Current Outpatient Prescriptions  Medication Sig Dispense Refill  . ALBUTEROL IN Inhale into the lungs as needed.        Marland Kitchen aspirin 81 MG tablet Take 81 mg by mouth daily.        Marland Kitchen BIOTIN FORTE PO Take 1 tablet by mouth daily.       . calcium carbonate (OS-CAL) 600 MG TABS Take 600 mg by mouth 2 (two) times daily with a meal.        . Cholecalciferol (VITAMIN D) 2000 UNITS CAPS Take 1 capsule by mouth daily.        . cyclobenzaprine (FLEXERIL) 5 MG tablet Take 5 mg by mouth 3 (three) times daily as needed.        . DULoxetine (CYMBALTA) 60 MG capsule Take 1 capsule (60 mg total) by mouth at bedtime.      Marland Kitchen estradiol (VAGIFEM) 25 MCG vaginal tablet Place 1 tablet (25 mcg total) vaginally 2 (two) times a week.      . fish oil-omega-3 fatty acids 1000 MG capsule Take  2 g by mouth daily.        . Ginger, Zingiber officinalis, (GINGER ROOT) 500 MG CAPS Take 1 capsule by mouth daily.        . Glucosamine Sulfate 1000 MG CAPS Take 2 capsules (2,000 mg total) by mouth daily.      . hydrochlorothiazide (HYDRODIURIL) 25 MG tablet Take 25 mg by mouth daily as needed.      . Hydrocodone-Acetaminophen 10-660 MG TABS Take 0.5-1 tablets by mouth 2 (two) times daily as needed.      . ibandronate (BONIVA) 3 MG/3ML SOLN injection Inject 3 mg into the vein every 3 (three) months.        . InFLIXimab (REMICADE IV) Inject into the vein.      Marland Kitchen leflunomide (ARAVA) 20 MG tablet Take 20 mg by mouth daily.       . metoprolol succinate (TOPROL-XL) 50 MG 24 hr tablet Take 1 tablet (50 mg total) by mouth daily. Take with or immediately following a meal.      . Multiple Vitamin (MULTIVITAMIN) tablet Take 1 tablet by mouth daily.        . Nutritional Supplements (CHOICE DM FIBER-BURST PO) Take 2 tablets by mouth daily. Chewable tablet      . potassium chloride (KLOR-CON) 20 MEQ packet  Take 20 mEq by mouth daily.        . pravastatin (PRAVACHOL) 20 MG tablet Take 1-2 tablets (20-40 mg total) by mouth daily. Dose is alternating 20mg  and 40mg  daily    3  . predniSONE (DELTASONE) 5 MG tablet Take 5 mg by mouth daily.        Marland Kitchen PROBIOTIC CAPS Take 1 capsule by mouth daily.      . sodium chloride 0.9 % SOLN with inFLIXimab 100 MG SOLR Inject into the vein. As directed for psoriatic arthritis      . spironolactone (ALDACTONE) 25 MG tablet Take 25 mg by mouth daily.        Marland Kitchen topiramate (TOPAMAX) 200 MG tablet Take 1 tablet (200 mg total) by mouth at bedtime.      . traMADol (ULTRAM) 50 MG tablet Take 100 mg by mouth every 6 (six) hours as needed. Maximum dose= 8 tablets per day.  Current dose is 100mg  BID.      . Turmeric 500 MG CAPS Take 1 capsule by mouth daily.        . pantoprazole (PROTONIX) 40 MG tablet Take 1 tablet (40 mg total) by mouth 2 (two) times daily before a meal.  180 tablet  3   . [DISCONTINUED] Glucosamine 500 MG TABS Take 4 tablets by mouth daily.        . [DISCONTINUED] simvastatin (ZOCOR) 40 MG tablet Take 20 mg by mouth at bedtime.        No current facility-administered medications for this visit.    Allergies as of 06/25/2012 - Review Complete 06/25/2012  Allergen Reaction Noted  . Adalimumab Rash and Other (See Comments)   . Imuran (azathioprine sodium) Rash 07/03/2011  . Methotrexate Other (See Comments)   . Plaquenil (hydroxychloroquine sulfate) Rash 07/03/2011  . Sulfonamide derivatives Rash   . Morphine Nausea And Vomiting     ROS:  General: Negative for anorexia, weight loss, fever, chills, fatigue, weakness. ENT: Negative for hoarseness, difficulty swallowing , nasal congestion. CV: Negative for chest pain, angina, palpitations, dyspnea on exertion, peripheral edema.  Respiratory: Negative for dyspnea at rest, dyspnea on exertion, cough, sputum, wheezing.  GI: See history of present illness. GU:  Negative for dysuria, hematuria, urinary incontinence, urinary frequency, nocturnal urination.  Endo: complains of weight gain as mentioned in history of present illness   Physical Examination:   BP 151/78  Pulse 103  Temp(Src) 98.4 F (36.9 C) (Oral)  Ht 4' 11.5" (1.511 m)  Wt 157 lb (71.215 kg)  BMI 31.19 kg/m2  General: Well-nourished, well-developed in no acute distress.  Eyes: No icterus. Mouth: Oropharyngeal mucosa moist and pink , no lesions erythema or exudate. Lungs: Clear to auscultation bilaterally.  Heart: Regular rate and rhythm, no murmurs rubs or gallops.  Abdomen: Bowel sounds are normal, nontender, nondistended, no hepatosplenomegaly or masses, no abdominal bruits or hernia , no rebound or guarding.   Extremities: No lower extremity edema. Arthritic changes noted in her hands. Neuro: Alert and oriented x 4   Skin: Warm and dry, no jaundice.   Psych: Alert and cooperative, normal mood and affect.

## 2012-06-25 NOTE — Progress Notes (Signed)
Faxed to PCP

## 2012-06-25 NOTE — Assessment & Plan Note (Signed)
Possibly secondary to decrease in activity level. We'll check thyroid status.

## 2012-07-24 ENCOUNTER — Ambulatory Visit (HOSPITAL_COMMUNITY): Payer: 59

## 2012-08-02 LAB — T4, FREE: Free T4: 0.84 ng/dL (ref 0.80–1.80)

## 2012-08-02 LAB — TSH: TSH: 0.591 u[IU]/mL (ref 0.350–4.500)

## 2012-08-05 ENCOUNTER — Ambulatory Visit (HOSPITAL_COMMUNITY): Payer: 59

## 2012-08-06 NOTE — Progress Notes (Signed)
Quick Note:  LMOM to call. ______ 

## 2012-08-06 NOTE — Progress Notes (Signed)
Quick Note:  Please let patient know her thyroid labs are normal. ______

## 2012-08-07 ENCOUNTER — Telehealth: Payer: Self-pay | Admitting: Gastroenterology

## 2012-08-07 NOTE — Telephone Encounter (Signed)
Pt was returning DS call regarding her results. Please call her back at [phone]

## 2012-08-08 NOTE — Telephone Encounter (Signed)
Called and informed pt her thyroid test was normal.

## 2012-08-11 NOTE — Progress Notes (Signed)
Quick Note:  LMOM that Thyroid labs were normal. ______

## 2012-08-21 ENCOUNTER — Encounter (HOSPITAL_COMMUNITY): Payer: 59 | Attending: Internal Medicine

## 2012-08-21 VITALS — BP 125/63 | HR 81

## 2012-08-21 DIAGNOSIS — M899 Disorder of bone, unspecified: Secondary | ICD-10-CM | POA: Insufficient documentation

## 2012-08-21 DIAGNOSIS — M8580 Other specified disorders of bone density and structure, unspecified site: Secondary | ICD-10-CM

## 2012-08-21 MED ORDER — IBANDRONATE SODIUM 3 MG/3ML IV SOLN
INTRAVENOUS | Status: AC
  Filled 2012-08-21: qty 3

## 2012-08-21 MED ORDER — IBANDRONATE SODIUM 3 MG/3ML IV SOLN
3.0000 mg | Freq: Once | INTRAVENOUS | Status: AC
  Administered 2012-08-21: 3 mg via INTRAVENOUS

## 2012-08-21 NOTE — Progress Notes (Signed)
Tonya Hall presents today for injection per MD orders. Boniva 3mg  administered IV in left AC. Administration without incident. Patient tolerated well.

## 2012-09-20 NOTE — Progress Notes (Signed)
REVIEWED.  

## 2012-11-20 ENCOUNTER — Encounter (HOSPITAL_COMMUNITY)
Admission: RE | Admit: 2012-11-20 | Discharge: 2012-11-20 | Disposition: A | Discharge status: Home / Self Care | Payer: 59 | Source: Ambulatory Visit | Attending: Internal Medicine | Admitting: Internal Medicine

## 2012-11-20 ENCOUNTER — Ambulatory Visit (HOSPITAL_COMMUNITY): Payer: 59

## 2012-11-20 DIAGNOSIS — M899 Disorder of bone, unspecified: Secondary | ICD-10-CM | POA: Insufficient documentation

## 2012-11-20 LAB — RENAL FUNCTION PANEL
Albumin: 3.7 g/dL (ref 3.5–5.2)
BUN: 21 mg/dL (ref 6–23)
CO2: 21 mEq/L (ref 19–32)
Calcium: 9.6 mg/dL (ref 8.4–10.5)
Chloride: 98 mEq/L (ref 96–112)
Creatinine, Ser: 0.91 mg/dL (ref 0.50–1.10)
GFR calc Af Amer: 77 mL/min — ABNORMAL LOW (ref >90)
GFR calc non Af Amer: 66 mL/min — ABNORMAL LOW (ref >90)
Glucose, Bld: 197 mg/dL — ABNORMAL HIGH (ref 70–99)
Phosphorus: 4.1 mg/dL (ref 2.3–4.6)
Potassium: 3.9 mEq/L (ref 3.5–5.1)
Sodium: 133 mEq/L — ABNORMAL LOW (ref 135–145)

## 2012-11-20 MED ORDER — SODIUM CHLORIDE 0.9 % IJ SOLN
10.0000 mL | INTRAMUSCULAR | Status: AC | PRN
  Administered 2012-11-20 (×2): 10 mL via INTRAVENOUS

## 2012-11-20 MED ORDER — IBANDRONATE SODIUM 3 MG/3ML IV SOLN
3.0000 mg | Freq: Once | INTRAVENOUS | Status: AC
  Administered 2012-11-20: 3 mg via INTRAVENOUS

## 2012-11-20 MED ORDER — IBANDRONATE SODIUM 3 MG/3ML IV SOLN
INTRAVENOUS | Status: AC
  Filled 2012-11-20: qty 3

## 2012-12-29 ENCOUNTER — Other Ambulatory Visit: Payer: Self-pay | Admitting: Neurosurgery

## 2012-12-29 DIAGNOSIS — M5416 Radiculopathy, lumbar region: Secondary | ICD-10-CM

## 2013-01-09 ENCOUNTER — Other Ambulatory Visit: Payer: 59

## 2013-01-09 ENCOUNTER — Ambulatory Visit
Admission: RE | Admit: 2013-01-09 | Discharge: 2013-01-09 | Disposition: A | Discharge status: Home / Self Care | Payer: 59 | Source: Ambulatory Visit | Attending: Neurosurgery | Admitting: Neurosurgery

## 2013-01-09 VITALS — BP 121/50 | HR 88

## 2013-01-09 DIAGNOSIS — M5416 Radiculopathy, lumbar region: Secondary | ICD-10-CM

## 2013-01-09 MED ORDER — DIAZEPAM 5 MG PO TABS
10.0000 mg | ORAL_TABLET | Freq: Once | ORAL | Status: AC
  Administered 2013-01-09: 10 mg via ORAL

## 2013-01-09 MED ORDER — IOHEXOL 180 MG/ML  SOLN
17.0000 mL | Freq: Once | INTRAMUSCULAR | Status: AC | PRN
  Administered 2013-01-09: 17 mL via INTRATHECAL

## 2013-01-09 MED ORDER — MEPERIDINE HCL 100 MG/ML IJ SOLN
75.0000 mg | Freq: Once | INTRAMUSCULAR | Status: AC
  Administered 2013-01-09: 75 mg via INTRAMUSCULAR

## 2013-01-09 MED ORDER — ONDANSETRON HCL 4 MG/2ML IJ SOLN
4.0000 mg | Freq: Once | INTRAMUSCULAR | Status: AC
  Administered 2013-01-09: 4 mg via INTRAMUSCULAR

## 2013-01-23 ENCOUNTER — Other Ambulatory Visit: Payer: Self-pay | Admitting: Neurosurgery

## 2013-02-02 ENCOUNTER — Other Ambulatory Visit: Payer: Self-pay | Admitting: Obstetrics & Gynecology

## 2013-02-02 DIAGNOSIS — Z139 Encounter for screening, unspecified: Secondary | ICD-10-CM

## 2013-02-05 ENCOUNTER — Other Ambulatory Visit: Payer: Self-pay

## 2013-02-10 ENCOUNTER — Encounter (HOSPITAL_COMMUNITY): Payer: Self-pay

## 2013-02-10 ENCOUNTER — Encounter (HOSPITAL_COMMUNITY)
Admission: RE | Admit: 2013-02-10 | Discharge: 2013-02-10 | Disposition: A | Discharge status: Home / Self Care | Payer: 59 | Source: Ambulatory Visit | Attending: Anesthesiology | Admitting: Anesthesiology

## 2013-02-10 ENCOUNTER — Encounter (HOSPITAL_COMMUNITY): Payer: Self-pay | Admitting: Pharmacy Technician

## 2013-02-10 LAB — CBC
HCT: 37.5 % (ref 36.0–46.0)
Hemoglobin: 12.6 g/dL (ref 12.0–15.0)
MCH: 32.8 pg (ref 26.0–34.0)
MCHC: 33.6 g/dL (ref 30.0–36.0)
MCV: 97.7 fL (ref 78.0–100.0)
Platelets: 231 10*3/uL (ref 150–400)
RBC: 3.84 MIL/uL — ABNORMAL LOW (ref 3.87–5.11)
RDW: 13.3 % (ref 11.5–15.5)
WBC: 10.3 10*3/uL (ref 4.0–10.5)

## 2013-02-10 LAB — TYPE AND SCREEN
ABO/RH(D): AB POS
Antibody Screen: NEGATIVE

## 2013-02-10 LAB — BASIC METABOLIC PANEL
BUN: 19 mg/dL (ref 6–23)
CO2: 22 mEq/L (ref 19–32)
Calcium: 9.2 mg/dL (ref 8.4–10.5)
Chloride: 105 mEq/L (ref 96–112)
Creatinine, Ser: 0.91 mg/dL (ref 0.50–1.10)
GFR calc Af Amer: 76 mL/min — ABNORMAL LOW (ref >90)
GFR calc non Af Amer: 66 mL/min — ABNORMAL LOW (ref >90)
Glucose, Bld: 85 mg/dL (ref 70–99)
Potassium: 3.9 mEq/L (ref 3.5–5.1)
Sodium: 137 mEq/L (ref 135–145)

## 2013-02-10 LAB — SURGICAL PCR SCREEN
MRSA, PCR: NEGATIVE
Staphylococcus aureus: POSITIVE — AB

## 2013-02-10 NOTE — Pre-Procedure Instructions (Addendum)
Tonya Hall  02/10/2013   Your procedure is scheduled on:  Wed, Nov 19 @ 11:00 AM  Report to [facility] Short Stay Entrance A at 8:00AM.  Call this number if you have problems the morning of surgery: [phone]   Remember:   Do not eat food or drink liquids after midnight.   Take these medicines the morning of surgery with A SIP OF WATER: Albuterol<Bring Your Inhaler With You>,Metoprolol(Toprol),Pantoprazole(Protonix),Ultram(Tramadol),Prednisone(Deltasone),and Pain Pill(if needed)               Stop taking your Aspirin and Fish Oil. No Goody's,BC's,Aleve,Ibuprofen,or any Herbal Medications   Do not wear jewelry, make-up or nail polish.  Do not wear lotions, powders, or perfumes. You may wear deodorant.  Do not shave 48 hours prior to surgery.   Do not bring valuables to the hospital.  [facility] is not responsible                  for any belongings or valuables.               Contacts, dentures or bridgework may not be worn into surgery.  Leave suitcase in the car. After surgery it may be brought to your room.  For patients admitted to the hospital, discharge time is determined by your                treatment team.               Special Instructions: Shower using CHG 2 nights before surgery and the night before surgery.  If you shower the day of surgery use CHG.  Use special wash - you have one bottle of CHG for all showers.  You should use approximately 1/3 of the bottle for each shower.   Please read over the following fact sheets that you were given: Pain Booklet, Coughing and Deep Breathing, Blood Transfusion Information, MRSA Information and Surgical Site Infection Prevention

## 2013-02-10 NOTE — Progress Notes (Addendum)
Pt doesn't have a cardiologist  Denies ever having an echo/stress test/heart cath  Medical Md is Dr.Roy Ouida Sills  Denies EKG or CXR in past yr

## 2013-02-12 ENCOUNTER — Ambulatory Visit (HOSPITAL_COMMUNITY)
Admission: RE | Admit: 2013-02-12 | Discharge: 2013-02-12 | Disposition: A | Discharge status: Home / Self Care | Payer: 59 | Source: Ambulatory Visit | Attending: Obstetrics & Gynecology | Admitting: Obstetrics & Gynecology

## 2013-02-12 ENCOUNTER — Encounter (HOSPITAL_COMMUNITY)
Admission: RE | Admit: 2013-02-12 | Discharge: 2013-02-12 | Disposition: A | Discharge status: Home / Self Care | Payer: 59 | Source: Ambulatory Visit | Attending: Neurosurgery | Admitting: Neurosurgery

## 2013-02-12 DIAGNOSIS — Z1231 Encounter for screening mammogram for malignant neoplasm of breast: Secondary | ICD-10-CM | POA: Insufficient documentation

## 2013-02-12 DIAGNOSIS — Z01810 Encounter for preprocedural cardiovascular examination: Secondary | ICD-10-CM | POA: Insufficient documentation

## 2013-02-12 DIAGNOSIS — Z139 Encounter for screening, unspecified: Secondary | ICD-10-CM

## 2013-02-12 DIAGNOSIS — Z01812 Encounter for preprocedural laboratory examination: Secondary | ICD-10-CM | POA: Insufficient documentation

## 2013-02-12 DIAGNOSIS — Z01818 Encounter for other preprocedural examination: Secondary | ICD-10-CM | POA: Insufficient documentation

## 2013-02-12 HISTORY — DX: Unspecified asthma, uncomplicated: J45.909

## 2013-02-12 HISTORY — DX: Localized edema: R60.0

## 2013-02-12 HISTORY — DX: Hyperlipidemia, unspecified: E78.5

## 2013-02-12 HISTORY — DX: Type 2 diabetes mellitus without complications: E11.9

## 2013-02-12 HISTORY — DX: Edema, unspecified: R60.9

## 2013-02-12 HISTORY — DX: Nausea with vomiting, unspecified: R11.2

## 2013-02-12 HISTORY — DX: Personal history of other medical treatment: Z92.89

## 2013-02-12 HISTORY — DX: Personal history of other diseases of the respiratory system: Z87.09

## 2013-02-12 HISTORY — DX: Unspecified cataract: H26.9

## 2013-02-12 HISTORY — DX: Other chronic pain: G89.29

## 2013-02-12 HISTORY — DX: Urgency of urination: R39.15

## 2013-02-12 HISTORY — DX: Dorsalgia, unspecified: M54.9

## 2013-02-12 HISTORY — DX: Other specified postprocedural states: Z98.890

## 2013-02-12 HISTORY — DX: Polyneuropathy, unspecified: G62.9

## 2013-02-17 MED ORDER — CEFAZOLIN SODIUM-DEXTROSE 2-3 GM-% IV SOLR
2.0000 g | INTRAVENOUS | Status: AC
  Administered 2013-02-18: 2 g via INTRAVENOUS
  Filled 2013-02-17: qty 50

## 2013-02-17 NOTE — H&P (Signed)
Tonya Hall is an 62 y.o. female.   Chief Complaint: lbp HPI: patient who in the past had lumbar fusion but now she came to my office several weeks ago with lpb with radiation to both legs left than the right with no relief with medications.an outpatient myelogram was done and in view of the findings she is admitted for surgery  Past Medical History  Diagnosis Date  . Lymphocytic colitis 05/26/10 TCS RMR    Responded to Entocort x 3 MOS  . Psoriatic arthritis     Dr. Anthonette Legato  . Degenerative disk disease   . Raynaud's disease   . Seasonal allergies   . Osteoporosis     gets Boniva every 3 months  . Hypokalemia   . Esophageal motility disorder     Non-specific, see modified barium study/speech path, BP  . Hyperlipidemia     takes Pravastatin daily  . PONV (postoperative nausea and vomiting)   . HTN (hypertension)     takes Metoprolol daily and SPironolactone daily  . Peripheral edema     takes HCTZ daily  . Shortness of breath     with exertion  . Asthma     Albuterol in haler prn  . History of bronchitis     last time early 2014  . Neuropathy   . Chronic back pain   . GERD (gastroesophageal reflux disease)     takes Protonix bid  . Inflammatory bowel disease (ulcerative colitis)   . Urinary urgency   . Anemia     at times  . History of blood transfusion     did have a rash after receiving the blood  . Diabetes mellitus without complication     borderline  . Cataract     immature unsure which eye    Past Surgical History  Procedure Laterality Date  . Cesarean section      2  . Tonsillectomy and adenoidectomy    . Lumbar disc surgery    . Lumbar fusion      x 3  . Dilation and curettage of uterus      spontaneous abortion  . Colonoscopy  2012 diarrhea    microscopic colitis  . Colonoscopy  2010 screening    [place]  Tics, Sml IH  . Upper gastrointestinal endoscopy  MAR 2010 DYSPEPSIA    REACTIVE GASTROPATHY  . Colonoscopy  05/26/2010   QIO:[id number] rectum, s/p biopsy/Sigmoid descending diverticula, otherwise normal-appearing colonic    Family History  Problem Relation Age of Onset  . Rheum arthritis Mother   . Diabetes Mother   . Rheumatic fever Father   . Colon cancer Neg Hx   . Colon polyps Neg Hx    Social History:  reports that she has never smoked. She has never used smokeless tobacco. She reports that she does not drink alcohol or use illicit drugs.  Allergies:  Allergies  Allergen Reactions  . Adalimumab Rash and Other (See Comments)    FEVER, Fast pulse  . Imuran [Azathioprine Sodium] Rash    Denies Airway involvement  . Methotrexate Other (See Comments)    Liver Enzyme elevation - after 1 month of use  . Plaquenil [Hydroxychloroquine Sulfate] Rash    Denies airway involvement.   . Sulfonamide Derivatives Rash    Occurred with Sulfasalazine. Rash only.   . Morphine Nausea And Vomiting    IV morphine caused N and V  After surgery.   Has tolerated Kadian in the past  No prescriptions prior to admission    No results found for this or any previous visit (from the past 48 hour(s)). No results found.  Review of Systems  Constitutional: Negative.   HENT: Negative.   Eyes: Negative.   Respiratory: Negative.   Cardiovascular:       Arterial hypertension  Gastrointestinal: Negative.   Genitourinary: Negative.   Musculoskeletal: Positive for back pain.  Skin: Negative.   Neurological: Positive for sensory change and focal weakness.  Endo/Heme/Allergies: Negative.   Psychiatric/Behavioral: Negative.     There were no vitals taken for this visit. Physical Exam hent, nl. Neck, nl. Cv, nl. Lungs clear. Abdomen, soft. Extremities, nl. neuroSLR positive bilaterally. Femoral stretch maneuver is positive . dtr wnl. Myelo shows a solid fusion from l2 to s1 but at l1-2 she has a large hnp central and to the left  Assessment/Plan After a long discussion with her we agree to a l1-2 discectomy  and fusion. She is aware of risks and benefits  Houston Surges M 02/17/2013, 6:55 PM

## 2013-02-18 ENCOUNTER — Ambulatory Visit (HOSPITAL_COMMUNITY): Payer: 59 | Admitting: Anesthesiology

## 2013-02-18 ENCOUNTER — Encounter (HOSPITAL_COMMUNITY)
Admission: RE | Disposition: A | Discharge status: Home / Self Care | Payer: 59 | Source: Ambulatory Visit | Attending: Neurosurgery

## 2013-02-18 ENCOUNTER — Ambulatory Visit (HOSPITAL_COMMUNITY): Payer: 59

## 2013-02-18 ENCOUNTER — Inpatient Hospital Stay (HOSPITAL_COMMUNITY)
Admission: RE | Admit: 2013-02-18 | Discharge: 2013-02-21 | DRG: 460 | Disposition: A | Discharge status: Home / Self Care | Payer: 59 | Source: Ambulatory Visit | Attending: Neurosurgery | Admitting: Neurosurgery

## 2013-02-18 ENCOUNTER — Encounter (HOSPITAL_COMMUNITY): Payer: Self-pay | Admitting: *Deleted

## 2013-02-18 ENCOUNTER — Encounter (HOSPITAL_COMMUNITY): Payer: 59 | Admitting: Anesthesiology

## 2013-02-18 DIAGNOSIS — I10 Essential (primary) hypertension: Secondary | ICD-10-CM | POA: Diagnosis present

## 2013-02-18 DIAGNOSIS — M51379 Other intervertebral disc degeneration, lumbosacral region without mention of lumbar back pain or lower extremity pain: Secondary | ICD-10-CM | POA: Diagnosis present

## 2013-02-18 DIAGNOSIS — M5126 Other intervertebral disc displacement, lumbar region: Principal | ICD-10-CM | POA: Diagnosis present

## 2013-02-18 DIAGNOSIS — Z889 Allergy status to unspecified drugs, medicaments and biological substances status: Secondary | ICD-10-CM

## 2013-02-18 DIAGNOSIS — E785 Hyperlipidemia, unspecified: Secondary | ICD-10-CM | POA: Diagnosis present

## 2013-02-18 DIAGNOSIS — M5137 Other intervertebral disc degeneration, lumbosacral region: Secondary | ICD-10-CM | POA: Diagnosis present

## 2013-02-18 DIAGNOSIS — Z472 Encounter for removal of internal fixation device: Secondary | ICD-10-CM

## 2013-02-18 DIAGNOSIS — M810 Age-related osteoporosis without current pathological fracture: Secondary | ICD-10-CM | POA: Diagnosis present

## 2013-02-18 DIAGNOSIS — R7309 Other abnormal glucose: Secondary | ICD-10-CM | POA: Diagnosis present

## 2013-02-18 DIAGNOSIS — Z23 Encounter for immunization: Secondary | ICD-10-CM

## 2013-02-18 DIAGNOSIS — Z01818 Encounter for other preprocedural examination: Secondary | ICD-10-CM

## 2013-02-18 DIAGNOSIS — K219 Gastro-esophageal reflux disease without esophagitis: Secondary | ICD-10-CM | POA: Diagnosis present

## 2013-02-18 DIAGNOSIS — Z981 Arthrodesis status: Secondary | ICD-10-CM

## 2013-02-18 DIAGNOSIS — L4050 Arthropathic psoriasis, unspecified: Secondary | ICD-10-CM | POA: Diagnosis present

## 2013-02-18 DIAGNOSIS — I7300 Raynaud's syndrome without gangrene: Secondary | ICD-10-CM | POA: Diagnosis present

## 2013-02-18 LAB — GLUCOSE, CAPILLARY: Glucose-Capillary: 147 mg/dL — ABNORMAL HIGH (ref 70–99)

## 2013-02-18 SURGERY — POSTERIOR LUMBAR FUSION 1 LEVEL
Anesthesia: General | Wound class: Clean

## 2013-02-18 MED ORDER — PROMETHAZINE HCL 25 MG/ML IJ SOLN: 6.2500 mg | INTRAMUSCULAR | Status: DC | PRN

## 2013-02-18 MED ORDER — SODIUM CHLORIDE 0.9 % IJ SOLN: 3.0000 mL | INTRAMUSCULAR | Status: DC | PRN

## 2013-02-18 MED ORDER — ACETAMINOPHEN 325 MG PO TABS: 650.0000 mg | ORAL_TABLET | ORAL | Status: DC | PRN

## 2013-02-18 MED ORDER — ONDANSETRON HCL 4 MG/2ML IJ SOLN: 4.0000 mg | Freq: Four times a day (QID) | INTRAMUSCULAR | Status: DC | PRN

## 2013-02-18 MED ORDER — HYDROMORPHONE 0.3 MG/ML IV SOLN
INTRAVENOUS | Status: DC
  Administered 2013-02-18: 17:00:00 via INTRAVENOUS
  Administered 2013-02-18: 1.8 mg via INTRAVENOUS
  Administered 2013-02-18: 0.6 mg via INTRAVENOUS
  Administered 2013-02-19: 5.8 mg via INTRAVENOUS
  Administered 2013-02-19: 1.9 mg via INTRAVENOUS
  Administered 2013-02-19: 0.3 mg via INTRAVENOUS
  Administered 2013-02-19: 2.1 mg via INTRAVENOUS
  Filled 2013-02-18: qty 25

## 2013-02-18 MED ORDER — DIPHENHYDRAMINE HCL 12.5 MG/5ML PO ELIX: 12.5000 mg | ORAL_SOLUTION | Freq: Four times a day (QID) | ORAL | Status: DC | PRN

## 2013-02-18 MED ORDER — ALBUMIN HUMAN 5 % IV SOLN
INTRAVENOUS | Status: DC | PRN
  Administered 2013-02-18 (×2): via INTRAVENOUS

## 2013-02-18 MED ORDER — AZITHROMYCIN 250 MG PO TABS
250.0000 mg | ORAL_TABLET | Freq: Every day | ORAL | Status: AC
  Administered 2013-02-18 – 2013-02-20 (×3): 250 mg via ORAL
  Filled 2013-02-18 (×3): qty 1

## 2013-02-18 MED ORDER — OXYCODONE HCL 5 MG/5ML PO SOLN: 5.0000 mg | Freq: Once | ORAL | Status: DC | PRN

## 2013-02-18 MED ORDER — HYDROCORTISONE SOD SUCCINATE 100 MG IJ SOLR
INTRAMUSCULAR | Status: DC | PRN
  Administered 2013-02-18: 100 mg via INTRAVENOUS

## 2013-02-18 MED ORDER — NALOXONE HCL 0.4 MG/ML IJ SOLN: 0.4000 mg | INTRAMUSCULAR | Status: DC | PRN

## 2013-02-18 MED ORDER — 0.9 % SODIUM CHLORIDE (POUR BTL) OPTIME
TOPICAL | Status: DC | PRN
  Administered 2013-02-18: 1000 mL

## 2013-02-18 MED ORDER — ALBUTEROL SULFATE HFA 108 (90 BASE) MCG/ACT IN AERS
1.0000 | INHALATION_SPRAY | Freq: Four times a day (QID) | RESPIRATORY_TRACT | Status: DC | PRN
  Administered 2013-02-19: 1 via RESPIRATORY_TRACT
  Filled 2013-02-18: qty 6.7

## 2013-02-18 MED ORDER — ASPIRIN EC 81 MG PO TBEC
81.0000 mg | DELAYED_RELEASE_TABLET | Freq: Every day | ORAL | Status: DC
  Administered 2013-02-18 – 2013-02-21 (×4): 81 mg via ORAL
  Filled 2013-02-18 (×4): qty 1

## 2013-02-18 MED ORDER — ONDANSETRON HCL 4 MG/2ML IJ SOLN
INTRAMUSCULAR | Status: DC | PRN
  Administered 2013-02-18: 4 mg via INTRAVENOUS

## 2013-02-18 MED ORDER — NEOSTIGMINE METHYLSULFATE 1 MG/ML IJ SOLN
INTRAMUSCULAR | Status: DC | PRN
  Administered 2013-02-18: 4 mg via INTRAVENOUS

## 2013-02-18 MED ORDER — HYDROMORPHONE HCL PF 1 MG/ML IJ SOLN
INTRAMUSCULAR | Status: AC
  Filled 2013-02-18: qty 1

## 2013-02-18 MED ORDER — ROCURONIUM BROMIDE 100 MG/10ML IV SOLN
INTRAVENOUS | Status: DC | PRN
  Administered 2013-02-18: 50 mg via INTRAVENOUS

## 2013-02-18 MED ORDER — HYDROMORPHONE HCL PF 1 MG/ML IJ SOLN: 0.2500 mg | INTRAMUSCULAR | Status: DC | PRN

## 2013-02-18 MED ORDER — GLYCOPYRROLATE 0.2 MG/ML IJ SOLN
INTRAMUSCULAR | Status: DC | PRN
  Administered 2013-02-18: 0.6 mg via INTRAVENOUS

## 2013-02-18 MED ORDER — HYDROMORPHONE 0.3 MG/ML IV SOLN
INTRAVENOUS | Status: AC
  Filled 2013-02-18: qty 25

## 2013-02-18 MED ORDER — VECURONIUM BROMIDE 10 MG IV SOLR
INTRAVENOUS | Status: DC | PRN
  Administered 2013-02-18: 1 mg via INTRAVENOUS
  Administered 2013-02-18: 2 mg via INTRAVENOUS
  Administered 2013-02-18: 1 mg via INTRAVENOUS

## 2013-02-18 MED ORDER — CEFAZOLIN SODIUM 1-5 GM-% IV SOLN
1.0000 g | Freq: Three times a day (TID) | INTRAVENOUS | Status: AC
  Administered 2013-02-18 – 2013-02-19 (×2): 1 g via INTRAVENOUS
  Filled 2013-02-18 (×3): qty 50

## 2013-02-18 MED ORDER — ACETAMINOPHEN 650 MG RE SUPP: 650.0000 mg | RECTAL | Status: DC | PRN

## 2013-02-18 MED ORDER — ZOLPIDEM TARTRATE 5 MG PO TABS: 5.0000 mg | ORAL_TABLET | Freq: Every evening | ORAL | Status: DC | PRN

## 2013-02-18 MED ORDER — OXYCODONE HCL 5 MG PO TABS: 5.0000 mg | ORAL_TABLET | Freq: Once | ORAL | Status: DC | PRN

## 2013-02-18 MED ORDER — ARTIFICIAL TEARS OP OINT
TOPICAL_OINTMENT | OPHTHALMIC | Status: DC | PRN
  Administered 2013-02-18: 1 via OPHTHALMIC

## 2013-02-18 MED ORDER — ZOLPIDEM TARTRATE 5 MG PO TABS: 10.0000 mg | ORAL_TABLET | Freq: Every evening | ORAL | Status: DC | PRN

## 2013-02-18 MED ORDER — PHENYLEPHRINE HCL 10 MG/ML IJ SOLN
INTRAMUSCULAR | Status: DC | PRN
  Administered 2013-02-18: 40 ug via INTRAVENOUS
  Administered 2013-02-18: 120 ug via INTRAVENOUS

## 2013-02-18 MED ORDER — PNEUMOCOCCAL VAC POLYVALENT 25 MCG/0.5ML IJ INJ
0.5000 mL | INJECTION | INTRAMUSCULAR | Status: AC
  Administered 2013-02-19: 0.5 mL via INTRAMUSCULAR
  Filled 2013-02-18: qty 0.5

## 2013-02-18 MED ORDER — LIDOCAINE HCL (CARDIAC) 20 MG/ML IV SOLN
INTRAVENOUS | Status: DC | PRN
  Administered 2013-02-18: 60 mg via INTRAVENOUS

## 2013-02-18 MED ORDER — EPHEDRINE SULFATE 50 MG/ML IJ SOLN
INTRAMUSCULAR | Status: DC | PRN
  Administered 2013-02-18 (×2): 15 mg via INTRAVENOUS

## 2013-02-18 MED ORDER — DIAZEPAM 5 MG PO TABS
5.0000 mg | ORAL_TABLET | Freq: Four times a day (QID) | ORAL | Status: DC | PRN
  Administered 2013-02-18 – 2013-02-21 (×4): 5 mg via ORAL
  Filled 2013-02-18 (×3): qty 1

## 2013-02-18 MED ORDER — THROMBIN 20000 UNITS EX SOLR
CUTANEOUS | Status: DC | PRN
  Administered 2013-02-18: 11:00:00 via TOPICAL

## 2013-02-18 MED ORDER — SENNOSIDES-DOCUSATE SODIUM 8.6-50 MG PO TABS
1.0000 | ORAL_TABLET | Freq: Every evening | ORAL | Status: DC | PRN
  Filled 2013-02-18: qty 1

## 2013-02-18 MED ORDER — LACTATED RINGERS IV SOLN
INTRAVENOUS | Status: DC | PRN
  Administered 2013-02-18 (×2): via INTRAVENOUS

## 2013-02-18 MED ORDER — FENTANYL CITRATE 0.05 MG/ML IJ SOLN
INTRAMUSCULAR | Status: DC | PRN
  Administered 2013-02-18 (×5): 50 ug via INTRAVENOUS

## 2013-02-18 MED ORDER — SODIUM CHLORIDE 0.9 % IJ SOLN: 9.0000 mL | INTRAMUSCULAR | Status: DC | PRN

## 2013-02-18 MED ORDER — SODIUM CHLORIDE 0.9 % IV SOLN: 250.0000 mL | INTRAVENOUS | Status: DC

## 2013-02-18 MED ORDER — PHENYLEPHRINE HCL 10 MG/ML IJ SOLN
10.0000 mg | INTRAMUSCULAR | Status: DC | PRN
  Administered 2013-02-18: 50 ug/min via INTRAVENOUS

## 2013-02-18 MED ORDER — ONDANSETRON HCL 4 MG/2ML IJ SOLN
4.0000 mg | INTRAMUSCULAR | Status: DC | PRN
  Administered 2013-02-19: 4 mg via INTRAVENOUS
  Filled 2013-02-18: qty 2

## 2013-02-18 MED ORDER — OXYCODONE-ACETAMINOPHEN 5-325 MG PO TABS
1.0000 | ORAL_TABLET | ORAL | Status: DC | PRN
  Administered 2013-02-19 – 2013-02-21 (×10): 2 via ORAL
  Filled 2013-02-18 (×10): qty 2

## 2013-02-18 MED ORDER — MIDAZOLAM HCL 5 MG/5ML IJ SOLN
INTRAMUSCULAR | Status: DC | PRN
  Administered 2013-02-18: 1 mg via INTRAVENOUS

## 2013-02-18 MED ORDER — PHENOL 1.4 % MT LIQD: 1.0000 | OROMUCOSAL | Status: DC | PRN

## 2013-02-18 MED ORDER — AZITHROMYCIN 250 MG PO TABS: 250.0000 mg | ORAL_TABLET | Freq: Every day | ORAL | Status: DC

## 2013-02-18 MED ORDER — DIPHENHYDRAMINE HCL 50 MG/ML IJ SOLN: 12.5000 mg | Freq: Four times a day (QID) | INTRAMUSCULAR | Status: DC | PRN

## 2013-02-18 MED ORDER — DULOXETINE HCL 60 MG PO CPEP
60.0000 mg | ORAL_CAPSULE | Freq: Every day | ORAL | Status: DC
  Administered 2013-02-18 – 2013-02-20 (×3): 60 mg via ORAL
  Filled 2013-02-18 (×4): qty 1

## 2013-02-18 MED ORDER — SODIUM CHLORIDE 0.9 % IJ SOLN
3.0000 mL | Freq: Two times a day (BID) | INTRAMUSCULAR | Status: DC
  Administered 2013-02-19 – 2013-02-20 (×2): 3 mL via INTRAVENOUS

## 2013-02-18 MED ORDER — DIAZEPAM 5 MG PO TABS
ORAL_TABLET | ORAL | Status: AC
  Filled 2013-02-18: qty 1

## 2013-02-18 MED ORDER — MENTHOL 3 MG MT LOZG: 1.0000 | LOZENGE | OROMUCOSAL | Status: DC | PRN

## 2013-02-18 MED ORDER — HYDROCHLOROTHIAZIDE 25 MG PO TABS
25.0000 mg | ORAL_TABLET | Freq: Every day | ORAL | Status: DC
  Administered 2013-02-21: 25 mg via ORAL
  Filled 2013-02-18 (×4): qty 1

## 2013-02-18 MED ORDER — PROPOFOL 10 MG/ML IV BOLUS
INTRAVENOUS | Status: DC | PRN
  Administered 2013-02-18: 160 mg via INTRAVENOUS

## 2013-02-18 MED ORDER — SODIUM CHLORIDE 0.9 % IV SOLN
INTRAVENOUS | Status: DC
  Administered 2013-02-18: 19:00:00 via INTRAVENOUS

## 2013-02-18 MED ORDER — HYDROMORPHONE HCL PF 1 MG/ML IJ SOLN
0.2500 mg | INTRAMUSCULAR | Status: DC | PRN
  Administered 2013-02-18 (×4): 0.5 mg via INTRAVENOUS

## 2013-02-18 MED ORDER — LACTATED RINGERS IV SOLN
INTRAVENOUS | Status: DC
  Administered 2013-02-18: 10:00:00 via INTRAVENOUS

## 2013-02-18 SURGICAL SUPPLY — 66 items
BENZOIN TINCTURE PRP APPL 2/3 (GAUZE/BANDAGES/DRESSINGS) ×2 IMPLANT
BLADE SURG ROTATE 9660 (MISCELLANEOUS) IMPLANT
BUR ACORN 6.0 (BURR) ×2 IMPLANT
BUR MATCHSTICK NEURO 3.0 LAGG (BURR) ×2 IMPLANT
CANISTER SUCT 3000ML (MISCELLANEOUS) ×2 IMPLANT
CAP LOCKING REVERE (Cap) ×8 IMPLANT
CONT SPEC 4OZ CLIKSEAL STRL BL (MISCELLANEOUS) ×2 IMPLANT
COVER BACK TABLE 24X17X13 BIG (DRAPES) IMPLANT
COVER TABLE BACK 60X90 (DRAPES) ×2 IMPLANT
DRAPE C-ARM 42X72 X-RAY (DRAPES) ×4 IMPLANT
DRAPE LAPAROTOMY 100X72X124 (DRAPES) ×2 IMPLANT
DRAPE POUCH INSTRU U-SHP 10X18 (DRAPES) ×2 IMPLANT
DRSG OPSITE POSTOP 4X6 (GAUZE/BANDAGES/DRESSINGS) ×2 IMPLANT
DRSG PAD ABDOMINAL 8X10 ST (GAUZE/BANDAGES/DRESSINGS) IMPLANT
DURAPREP 26ML APPLICATOR (WOUND CARE) ×2 IMPLANT
ELECT REM PT RETURN 9FT ADLT (ELECTROSURGICAL) ×2
ELECTRODE REM PT RTRN 9FT ADLT (ELECTROSURGICAL) ×1 IMPLANT
EVACUATOR 1/8 PVC DRAIN (DRAIN) IMPLANT
GAUZE SPONGE 4X4 16PLY XRAY LF (GAUZE/BANDAGES/DRESSINGS) ×2 IMPLANT
GLOVE BIO SURGEON STRL SZ 6.5 (GLOVE) ×8 IMPLANT
GLOVE BIOGEL M 8.0 STRL (GLOVE) ×2 IMPLANT
GLOVE ECLIPSE 7.5 STRL STRAW (GLOVE) ×2 IMPLANT
GLOVE EXAM NITRILE LRG STRL (GLOVE) ×4 IMPLANT
GLOVE EXAM NITRILE MD LF STRL (GLOVE) IMPLANT
GLOVE EXAM NITRILE XL STR (GLOVE) IMPLANT
GLOVE EXAM NITRILE XS STR PU (GLOVE) IMPLANT
GLOVE INDICATOR 6.5 STRL GRN (GLOVE) ×6 IMPLANT
GLOVE INDICATOR 8.0 STRL GRN (GLOVE) ×2 IMPLANT
GOWN BRE IMP SLV AUR LG STRL (GOWN DISPOSABLE) ×6 IMPLANT
GOWN BRE IMP SLV AUR XL STRL (GOWN DISPOSABLE) ×4 IMPLANT
GOWN STRL REIN 2XL LVL4 (GOWN DISPOSABLE) IMPLANT
KIT BASIN OR (CUSTOM PROCEDURE TRAY) ×2 IMPLANT
KIT INFUSE MEDIUM (Orthopedic Implant) ×2 IMPLANT
KIT ROOM TURNOVER OR (KITS) ×2 IMPLANT
MILL MEDIUM DISP (BLADE) ×2 IMPLANT
NEEDLE HYPO 18GX1.5 BLUNT FILL (NEEDLE) IMPLANT
NEEDLE HYPO 21X1.5 SAFETY (NEEDLE) IMPLANT
NEEDLE HYPO 25X1 1.5 SAFETY (NEEDLE) IMPLANT
NS IRRIG 1000ML POUR BTL (IV SOLUTION) ×2 IMPLANT
PACK LAMINECTOMY NEURO (CUSTOM PROCEDURE TRAY) ×2 IMPLANT
PAD ARMBOARD 7.5X6 YLW CONV (MISCELLANEOUS) ×6 IMPLANT
PATTIES SURGICAL .5 X.5 (GAUZE/BANDAGES/DRESSINGS) ×2 IMPLANT
PATTIES SURGICAL .5 X1 (DISPOSABLE) IMPLANT
PATTIES SURGICAL .5 X3 (DISPOSABLE) IMPLANT
RASP 3.0MM (RASP) ×2 IMPLANT
ROD CURVED 5.5X40MM (Rod) ×2 IMPLANT
ROD CURVED 5.5X45MM (Rod) ×2 IMPLANT
SCREW PEDICLE 5.5MMX40MM (Screw) ×4 IMPLANT
SCREW PEDICLE 6.5MMX35MM (Screw) ×4 IMPLANT
SPONGE GAUZE 4X4 12PLY (GAUZE/BANDAGES/DRESSINGS) ×2 IMPLANT
SPONGE LAP 4X18 X RAY DECT (DISPOSABLE) IMPLANT
SPONGE NEURO XRAY DETECT 1X3 (DISPOSABLE) IMPLANT
SPONGE SURGIFOAM ABS GEL 100 (HEMOSTASIS) ×2 IMPLANT
STRIP CLOSURE SKIN 1/2X4 (GAUZE/BANDAGES/DRESSINGS) ×2 IMPLANT
SUT VIC AB 1 CT1 18XBRD ANBCTR (SUTURE) ×1 IMPLANT
SUT VIC AB 1 CT1 8-18 (SUTURE) ×1
SUT VIC AB 2-0 CP2 18 (SUTURE) ×2 IMPLANT
SUT VIC AB 3-0 SH 8-18 (SUTURE) ×2 IMPLANT
SYR 20CC LL (SYRINGE) IMPLANT
SYR 20ML ECCENTRIC (SYRINGE) ×2 IMPLANT
SYR 5ML LL (SYRINGE) IMPLANT
T CONNECTOR ADJ 33MM-41MM (Connector) ×2 IMPLANT
TOWEL OR 17X24 6PK STRL BLUE (TOWEL DISPOSABLE) ×2 IMPLANT
TOWEL OR 17X26 10 PK STRL BLUE (TOWEL DISPOSABLE) ×2 IMPLANT
TRAY FOLEY CATH 14FRSI W/METER (CATHETERS) ×2 IMPLANT
WATER STERILE IRR 1000ML POUR (IV SOLUTION) ×2 IMPLANT

## 2013-02-18 NOTE — Transfer of Care (Signed)
Immediate Anesthesia Transfer of Care Note  Patient: Tonya Hall  Procedure(s) Performed: Procedure(s): LUMBAR ONE-TWO Exploration and extension of fusion (N/A)  Patient Location: PACU  Anesthesia Type:General  Level of Consciousness: awake and alert   Airway & Oxygen Therapy: Patient Spontanous Breathing and Patient connected to nasal cannula oxygen  Post-op Assessment: Report given to PACU RN, Post -op Vital signs reviewed and stable and Patient moving all extremities  Post vital signs: Reviewed and stable  Complications: No apparent anesthesia complications

## 2013-02-18 NOTE — Anesthesia Preprocedure Evaluation (Signed)
Anesthesia Evaluation  Patient identified by MRN, date of birth, ID band Patient awake    Reviewed: Allergy & Precautions, H&P , NPO status , Patient's Chart, lab work & pertinent test results  History of Anesthesia Complications (+) PONV  Airway       Dental   Pulmonary shortness of breath, asthma ,          Cardiovascular hypertension,     Neuro/Psych  Neuromuscular disease    GI/Hepatic PUD, GERD-  ,  Endo/Other  diabetes  Renal/GU      Musculoskeletal  (+) Arthritis -,   Abdominal   Peds  Hematology   Anesthesia Other Findings   Reproductive/Obstetrics                           Anesthesia Physical Anesthesia Plan  ASA: II  Anesthesia Plan: General   Post-op Pain Management:    Induction: Intravenous  Airway Management Planned: Oral ETT  Additional Equipment:   Intra-op Plan:   Post-operative Plan: Extubation in OR  Informed Consent:   Dental advisory given  Plan Discussed with:   Anesthesia Plan Comments:         Anesthesia Quick Evaluation

## 2013-02-18 NOTE — Progress Notes (Signed)
Op note [phone]

## 2013-02-18 NOTE — Anesthesia Procedure Notes (Addendum)
Procedure Name: Intubation Date/Time: 02/18/2013 11:22 AM Performed by: Gayla Medicus Pre-anesthesia Checklist: Patient identified, Timeout performed, Emergency Drugs available, Suction available and Patient being monitored Patient Re-evaluated:Patient Re-evaluated prior to inductionOxygen Delivery Method: Circle system utilized Preoxygenation: Pre-oxygenation with 100% oxygen Intubation Type: IV induction Ventilation: Mask ventilation without difficulty Laryngoscope Size: Mac and 3 Grade View: Grade III Tube size: 7.5 mm Number of attempts: 1 Airway Equipment and Method: Stylet Placement Confirmation: ETT inserted through vocal cords under direct vision,  positive ETCO2 and breath sounds checked- equal and bilateral Secured at: 22 cm Tube secured with: Tape Dental Injury: Teeth and Oropharynx as per pre-operative assessment

## 2013-02-18 NOTE — Anesthesia Postprocedure Evaluation (Signed)
  Anesthesia Post-op Note  Patient: Tonya Hall  Procedure(s) Performed: Procedure(s): LUMBAR ONE-TWO Exploration and extension of fusion (N/A)  Patient Location: PACU  Anesthesia Type:General  Level of Consciousness: awake, alert , oriented and patient cooperative  Airway and Oxygen Therapy: Patient Spontanous Breathing  Post-op Pain: mild  Post-op Assessment: Post-op Vital signs reviewed, Patient's Cardiovascular Status Stable, Respiratory Function Stable, Patent Airway, No signs of Nausea or vomiting and Pain level controlled  Post-op Vital Signs: stable  Complications: No apparent anesthesia complications

## 2013-02-18 NOTE — Preoperative (Signed)
Beta Blockers   Reason not to administer Beta Blockers:Not Applicable 

## 2013-02-18 NOTE — Progress Notes (Signed)
Pt. States she has a stye infection in left eye. Monday she started on a z-pack.

## 2013-02-19 MED ORDER — SENNOSIDES-DOCUSATE SODIUM 8.6-50 MG PO TABS
1.0000 | ORAL_TABLET | Freq: Two times a day (BID) | ORAL | Status: DC
  Administered 2013-02-19 – 2013-02-21 (×4): 1 via ORAL
  Filled 2013-02-19 (×4): qty 1

## 2013-02-19 MED ORDER — PANTOPRAZOLE SODIUM 40 MG PO TBEC
40.0000 mg | DELAYED_RELEASE_TABLET | Freq: Every day | ORAL | Status: DC
  Administered 2013-02-19 – 2013-02-21 (×3): 40 mg via ORAL
  Filled 2013-02-19 (×3): qty 1

## 2013-02-19 MED ORDER — PREDNISONE 5 MG PO TABS
5.0000 mg | ORAL_TABLET | Freq: Every day | ORAL | Status: DC
  Administered 2013-02-20 – 2013-02-21 (×2): 5 mg via ORAL
  Filled 2013-02-19 (×3): qty 1

## 2013-02-19 MED ORDER — DIPHENHYDRAMINE HCL 25 MG PO CAPS
25.0000 mg | ORAL_CAPSULE | ORAL | Status: DC | PRN
  Administered 2013-02-19: 25 mg via ORAL
  Filled 2013-02-19: qty 1

## 2013-02-19 NOTE — Progress Notes (Signed)
   CARE MANAGEMENT NOTE 02/19/2013  Patient:  Tonya Hall, Tonya Hall   Account Number:  [id number]  Date Initiated:  02/19/2013  Documentation initiated by:  Jiles Crocker  Subjective/Objective Assessment:   ADMITTED FOR SURGERY     Action/Plan:   CM FOLLOWING FOR DCP   Anticipated DC Date:  02/23/2013   Anticipated DC Plan:  POSSIBLY HOME W HOME HEALTH SERVICES, AWAITING FOR PT/OT EVALS;   DC Planning Services  CM consult          Status of service:  In process, will continue to follow Medicare Important Message given?  NA - LOS <3 / Initial given by admissions (If response is "NO", the following Medicare IM given date fields will be blank)  Per UR Regulation:  Reviewed for med. necessity/level of care/duration of stay  Comments:  11/20/2014Abelino Derrick RN,BSN,MHA [phone]

## 2013-02-19 NOTE — Progress Notes (Signed)
Orthopedic Tech Progress Note Patient Details:  Tonya Hall Aug 09, 1950 [id number]  Patient ID: Tonya Hall, female   DOB: [birth year], 62 y.o.   MRN: [id number]   Tonya Hall 02/19/2013, 11:12 AMCalled bio-tech for lumbar corset.

## 2013-02-19 NOTE — Progress Notes (Signed)
Pt. Accidentally partially pulled Hemovac out.  Went on to completely removed Hemovac.  MD was notified and no orders given.  Will continue to monitor patient.  Call bell placed within reach of patient.

## 2013-02-19 NOTE — Evaluation (Signed)
Occupational Therapy Evaluation Patient Details Name: Tonya Hall MRN: [id number] DOB: 1950-05-02 Today's Date: 02/19/2013 Time: 4098-1191 OT Time Calculation (min): 45 min  OT Assessment / Plan / Recommendation History of present illness 62 yo female s/p PLIF L1-2   Clinical Impression   Patient is s/p PLIF L1-2 surgery resulting in functional limitations due to the deficits listed below (see OT problem list).  Patient will benefit from skilled OT acutely to increase independence and safety with ADLS to allow discharge HHOt.     OT Assessment  Patient needs continued OT Services    Follow Up Recommendations  Home health OT;Supervision - Intermittent    Barriers to Discharge      Equipment Recommendations  None recommended by OT    Recommendations for Other Services    Frequency  Min 2X/week    Precautions / Restrictions Precautions Precautions: Back Precaution Comments: back handout provided and used teach back method Required Braces or Orthoses: Spinal Brace Spinal Brace: Lumbar corset;Applied in sitting position   Pertinent Vitals/Pain Pain varied with max at 10 out 10 once sitting pca x2 push    ADL  Eating/Feeding: Set up Where Assessed - Eating/Feeding: Chair Grooming: Wash/dry hands;Wash/dry face;Teeth care;Set up Where Assessed - Grooming: Unsupported standing Upper Body Dressing: Minimal assistance Where Assessed - Upper Body Dressing: Unsupported sit to stand Lower Body Dressing: Moderate assistance Where Assessed - Lower Body Dressing: Unsupported sit to stand Toilet Transfer: Min Pension scheme manager Method: Sit to stand Toilet Transfer Equipment: Raised toilet seat with arms (or 3-in-1 over toilet) Equipment Used: Gait belt;Back brace;Rolling walker Transfers/Ambulation Related to ADLs: pt ambulated to sink and became nauseated with prolonged standing. Question orthostatic. Vitals obtained after sitting with 14 point systolic drop. ADL Comments:  Pt educateed on adls with back precautions. Pt with very tight fit to place back brace. Pt may need brace to be larger if still poor fit Friday. pt is unable to don brace without (A) due to poor fit at this time. pt completed oral care with faint feeling.     OT Diagnosis: Generalized weakness;Acute pain  OT Problem List: Decreased strength;Decreased activity tolerance;Impaired balance (sitting and/or standing);Decreased safety awareness;Decreased knowledge of use of DME or AE;Decreased knowledge of precautions;Pain OT Treatment Interventions: Self-care/ADL training;Therapeutic exercise;DME and/or AE instruction;Therapeutic activities;Patient/family education;Balance training   OT Goals(Current goals can be found in the care plan section) Acute Rehab OT Goals Patient Stated Goal: To get back home OT Goal Formulation: With patient/family Time For Goal Achievement: 03/05/13 Potential to Achieve Goals: Good  Visit Information  Last OT Received On: 02/19/13 Assistance Needed: +1 PT/OT Co-Evaluation/Treatment: Yes History of Present Illness: 62 yo female s/p PLIF L1-2       Prior Functioning     Home Living Family/patient expects to be discharged to:: Private residence Living Arrangements: Spouse/significant other Available Help at Discharge: Family;Available 24 hours/day Type of Home: House Home Access: Stairs to enter [employer] of Steps: 2 Entrance Stairs-Rails: None Home Layout: Two level;Full bath on main level Home Equipment: Bedside commode;Walker - 2 wheels;Adaptive equipment Adaptive Equipment: Reacher Additional Comments: drives a car Prior Function Level of Independence: Independent Communication Communication: No difficulties Dominant Hand: Right         Vision/Perception Vision - History Baseline Vision: Wears glasses all the time   Cognition  Cognition Arousal/Alertness: Awake/alert Behavior During Therapy: WFL for tasks  assessed/performed Overall Cognitive Status: Within Functional Limits for tasks assessed    Extremity/Trunk Assessment Upper Extremity Assessment Upper  Extremity Assessment: Overall WFL for tasks assessed Lower Extremity Assessment Lower Extremity Assessment: Defer to PT evaluation Cervical / Trunk Assessment Cervical / Trunk Assessment: Normal     Mobility Bed Mobility Bed Mobility: Rolling Right;Right Sidelying to Sit;Sitting - Scoot to [place] of Bed Rolling Right: 4: Min guard Right Sidelying to Sit: 4: Min assist Sitting - Scoot to [employer] of Bed: 5: Supervision Details for Bed Mobility Assistance: VCs for technique and positioning for log roll Transfers Sit to Stand: 4: Min guard Stand to Sit: 4: Min guard Details for Transfer Assistance: VCs for hand placement, patient became nauseated with standing was assisted to chair      Exercise     Balance Static Standing Balance Static Standing - Balance Support: During functional activity Static Standing - Level of Assistance: 5: Stand by assistance Static Standing - Comment/# of Minutes: 7 minutes at sink performing hygiene High Level Balance High Level Balance Activites: Side stepping;Direction changes;Turns High Level Balance Comments: min guard   End of Session OT - End of Session Activity Tolerance: Patient tolerated treatment well Patient left: in chair;with call bell/phone within reach;with family/visitor present;with nursing/sitter in room Nurse Communication: Mobility status;Precautions  GO     [name] 02/19/2013, 5:07 PM  Pager: [phone]

## 2013-02-19 NOTE — Progress Notes (Signed)
Patient ID: Tonya Hall, female   DOB: Jun 03, 1950, 62 y.o.   MRN: [id number] C/o incisional apin. No leg pain as preop. Pt to see. Continue pca.

## 2013-02-19 NOTE — Op Note (Addendum)
Tonya Hall, Tonya Hall               ACCOUNT NO.:  [id number]  MEDICAL RECORD NO.:  [id number]  LOCATION:  [id number]                        FACILITY:  [facility]  PHYSICIAN:  Hilda Lias, M.D.   DATE OF BIRTH:  05/23/1950  DATE OF PROCEDURE:  02/18/2013 DATE OF DISCHARGE:                              OPERATIVE REPORT   PREOPERATIVE DIAGNOSIS:  L1-L2 herniated disk with stenosis, degenerative disk disease, status post fusion L2 to L5-S1.  POSTOPERATIVE DIAGNOSIS:  L1-L2 herniated disk with stenosis, degenerative disk disease, status post fusion L2 to L5-S1.  PROCEDURE:  L1 laminectomy, bilaterally facetectomy, __________l1-l2 diskectomy, decompression of the thecal sac, removal of the previous screws at the level of L2, pedicle screws at the level of L1, posterolateral fusion L1-L2 with autograft and BMP.  Cell Saver.  C-arm.  SURGEON:  Hilda Lias, M.D.  ASSISTANT:  Hewitt Shorts, M.D.  CLINICAL HISTORY:  Tonya Hall is a lady who in the past had fusion from L2-L5-S1.  She had been doing fairly well, but for the past 6 months, she had been complaining of back pain with radiation to both legs, the left worse than right one.  Myelogram showed that she had the fusion between L2-S1, was solid with no compromise but at the level of L1-L2, she had a large herniated disk, mostly __________ to the left, producing a stenosis.  The patient has failed with conservative treatment, and she decided to go with surgery.  We talked about diskectomy versus fusion at the end because in the past, she has had diskectomy _she end up having a _________ fusion _so the decision was to proceed with discectomy and fuse the l1-2 arwea_________ this time.  The risks were fully explained to her in my office.  She is a Engineer, civil (consulting) at [employer].  PROCEDURE:  The patient was taken to the OR.  After intubation, she was positioned in a prone manner.  The back was cleaned with Betadine and later with  DuraPrep.  Drapes were applied.  Then, a midline incision from the upper part of the previous scar, up to__________ lower thoracic area was made through the skin and subcutaneous tissue.  Muscle was retracted laterally.  We were able to see immediately the pedicles of L2.  The pedicles were surrounded by bone.  Then, we identified by x-ray, the area of L1-L2.  We proceed with removal of lamina of L1 as well as the lamina, spinous process and the facet.  We identified the L1 and L2 space.  We retracted the thecal sac.  It was quite difficult to get into the disk space, but nevertheless, there was a fragment going to the body of L2 and that was removed.  We entered disk space on the left side, and diskectomy was achieved.  The area was quite narrow.  We decided not to do any interbody fusion.  From then on, after we had a good lysis of adhesion with decompression of the thecal sac as well as the L1 and L2 nerve root, using the C-arm first in AP view and then a lateral view, we did a hole in the pedicle of L1.  The hole was surrounded by bones.  _we introduced two pedicle _________ screws 5.5 x 40.  The x-ray showed good position of the pedicle screws and we feel the media wall of_the pedicles_________  and there was no compromise of the medial wall of the pedicles.  Then, we cut the rod from the previous _fusion_________ at the level of L2-3, and we removed the screws of L2.  We inserted new screws of 6.5 x 45.  BMP were used at the level of the __pedicle screws________ bone to allow more fusion.  Then, rods were used to get together the pedicle of L1-L2, followed by __capps_______ and cross- link.  Then, the area was irrigated.  We went laterally and we removed the periosteum of the lateral aspect of L1-L2 and a mix of BMP and autograft was used for arthrodesis.  Valsalva maneuver was negative.  The area was irrigated, a drain was left in the epidural space and the wound was closed with  Vicryl and Steri-Strips.          ______________________________ Hilda Lias, M.D.     EB/MEDQ  D:  02/18/2013  T:  02/19/2013  Job:  [id number]

## 2013-02-19 NOTE — Clinical Social Work Note (Signed)
CSW received consult for possible SNF placement upon medical discharge from [facility]. CSW signing off, but continuing to follow pt's progress for PT/OT recommendations post-surgery.  Darlyn Chamber, LCSWA Clinical Social Worker [phone]

## 2013-02-19 NOTE — Evaluation (Signed)
Physical Therapy Evaluation Patient Details Name: Tonya Hall MRN: [id number] DOB: 21-Apr-1950 Today's Date: 02/19/2013 Time: 4098-1191 PT Time Calculation (min): 42 min  PT Assessment / Plan / Recommendation History of Present Illness  62 yo female s/p PLIF L1-2  Clinical Impression  Patient demonstrates deficits in functional mobility as indicated below. Pt will benefit from continued skilled PT to address deficits and maximize independence. Will continue to see as indicated. rec HHPT upon discharge with family supervision and PRN assist.      PT Assessment  Patient needs continued PT services    Follow Up Recommendations  Home health PT;Supervision/Assistance - 24 hour                Frequency Min 5X/week    Precautions / Restrictions Precautions Precautions: Back Precaution Comments: back handout provided and used teach back method Required Braces or Orthoses: Spinal Brace Spinal Brace: Lumbar corset;Applied in sitting position   Pertinent Vitals/Pain Pt reports varying pain up to 10/10      Mobility  Bed Mobility Bed Mobility: Rolling Right;Right Sidelying to Sit;Sitting - Scoot to [place] of Bed Rolling Right: 4: Min guard Right Sidelying to Sit: 4: Min assist Sitting - Scoot to Edge of Bed: 5: Supervision Details for Bed Mobility Assistance: VCs for technique and positioning for log roll Transfers Transfers: Sit to Stand;Stand to Sit;Stand Pivot Transfers Sit to Stand: 4: Min guard Stand to Sit: 4: Min guard Stand Pivot Transfers: 4: Min guard Details for Transfer Assistance: VCs for hand placement, patient became nauseated with standing was assisted to chair  Ambulation/Gait Ambulation/Gait Assistance: 4: Min guard Ambulation Distance (Feet): 16 Feet Assistive device: Rolling walker Ambulation/Gait Assistance Details: pt limited by nausea Gait Pattern: Step-to pattern;Decreased stride length Gait velocity: decreased    Exercises     PT Diagnosis:  Difficulty walking;Abnormality of gait;Generalized weakness;Acute pain  PT Problem List: Decreased strength;Decreased activity tolerance;Decreased balance;Decreased mobility;Pain PT Treatment Interventions: DME instruction;Gait training;Stair training;Functional mobility training;Therapeutic activities;Therapeutic exercise;Balance training;Patient/family education     PT Goals(Current goals can be found in the care plan section) Acute Rehab PT Goals Patient Stated Goal: To get back home PT Goal Formulation: With patient Time For Goal Achievement: 02/26/13 Potential to Achieve Goals: Good  Visit Information  Last PT Received On: 02/19/13 Assistance Needed: +1 History of Present Illness: 62 yo female s/p PLIF L1-2       Prior Functioning  Home Living Family/patient expects to be discharged to:: Private residence Living Arrangements: Spouse/significant other Available Help at Discharge: Family;Available 24 hours/day Type of Home: House Home Access: Stairs to enter [employer] of Steps: 2 Entrance Stairs-Rails: None Home Layout: Two level;Full bath on main level Home Equipment: Bedside commode;Walker - 2 wheels;Adaptive equipment Adaptive Equipment: Reacher Additional Comments: drives a car Prior Function Level of Independence: Independent Communication Communication: No difficulties Dominant Hand: Right    Cognition  Cognition Arousal/Alertness: Awake/alert Behavior During Therapy: WFL for tasks assessed/performed Overall Cognitive Status: Within Functional Limits for tasks assessed    Extremity/Trunk Assessment Upper Extremity Assessment Upper Extremity Assessment: Defer to OT evaluation Lower Extremity Assessment Lower Extremity Assessment: Overall WFL for tasks assessed   Balance Static Standing Balance Static Standing - Balance Support: During functional activity Static Standing - Level of Assistance: 5: Stand by assistance Static Standing - Comment/# of  Minutes: 7 minutes at sink performing hygiene High Level Balance High Level Balance Activites: Side stepping;Direction changes;Turns High Level Balance Comments: min guard  End of Session PT - End  of Session Equipment Utilized During Treatment: Gait belt;Back brace Activity Tolerance: Patient limited by fatigue;Other (comment) (nauseated) Patient left: in chair;with call bell/phone within reach;with family/visitor present Nurse Communication: Mobility status;Precautions;Other (comment) (dressing change secondary to leaking hemovac and saturated dressing and chuck pad)  GP     Fabio Asa 02/19/2013, 4:03 PM Charlotte Crumb, PT DPT  [phone]

## 2013-02-20 ENCOUNTER — Other Ambulatory Visit (HOSPITAL_COMMUNITY): Payer: 59

## 2013-02-20 ENCOUNTER — Ambulatory Visit (HOSPITAL_COMMUNITY): Payer: 59

## 2013-02-20 MED ORDER — FLEET ENEMA 7-19 GM/118ML RE ENEM
1.0000 | ENEMA | Freq: Every day | RECTAL | Status: DC | PRN
  Filled 2013-02-20: qty 1

## 2013-02-20 MED FILL — Heparin Sodium (Porcine) Inj 1000 Unit/ML: INTRAMUSCULAR | Qty: 30 | Status: AC

## 2013-02-20 MED FILL — Sodium Chloride IV Soln 0.9%: INTRAVENOUS | Qty: 1000 | Status: AC

## 2013-02-20 NOTE — Progress Notes (Signed)
Physical Therapy Treatment Patient Details Name: AMIRE GOSSEN MRN: [id number] DOB: 04-21-1950 Today's Date: 02/20/2013 Time: 4098-1191 PT Time Calculation (min): 25 min  PT Assessment / Plan / Recommendation  History of Present Illness 62 yo female s/p PLIF L1-2   PT Comments   Patient demonstrates progress towards PT goals today. Patient ambulated well with maintaining good compliance for sternal precautions. Patient lumbar brace adjusted during session as it was not sized appropriately for patient. Patient appreciated adjustments. Will continue to progress patient as tolerated.   Follow Up Recommendations  Home health PT;Supervision/Assistance - 24 hour     Does the patient have the potential to tolerate intense rehabilitation     Barriers to Discharge        Equipment Recommendations  None recommended by PT    Recommendations for Other Services    Frequency Min 5X/week   Progress towards PT Goals    Plan Current plan remains appropriate    Precautions / Restrictions Precautions Precautions: Back Precaution Comments: back handout provided and used teach back method Required Braces or Orthoses: Spinal Brace Spinal Brace: Lumbar corset;Applied in sitting position   Pertinent Vitals/Pain 4/10 (on PO)    Mobility  Bed Mobility Bed Mobility: Rolling Right;Right Sidelying to Sit;Sitting - Scoot to [place] of Bed Rolling Right: 4: Min guard Right Sidelying to Sit: 4: Min assist Sitting - Scoot to Edge of Bed: 5: Supervision Details for Bed Mobility Assistance: VCs for technique and positioning for log roll Transfers Transfers: Sit to Stand;Stand to Sit Sit to Stand: 5: Supervision Stand to Sit: 5: Supervision Details for Transfer Assistance: initial VC for hand placement Ambulation/Gait Ambulation/Gait Assistance: 5: Supervision Ambulation Distance (Feet): 220 Feet Assistive device: Rolling walker Ambulation/Gait Assistance Details: Patient with improved stability  during ambulation today, cues for increased cadence Gait Pattern: Step-to pattern;Decreased stride length Gait velocity: decreased General Gait Details: good compliance with precautions during mobility     PT Goals (current goals can now be found in the care plan section) Acute Rehab PT Goals Patient Stated Goal: To get back home PT Goal Formulation: With patient Time For Goal Achievement: 02/26/13 Potential to Achieve Goals: Good  Visit Information  Last PT Received On: 02/20/13 Assistance Needed: +1 History of Present Illness: 62 yo female s/p PLIF L1-2    Subjective Data  Subjective: Pt reports brace did not fit Patient Stated Goal: To get back home   Cognition  Cognition Arousal/Alertness: Awake/alert Behavior During Therapy: WFL for tasks assessed/performed Overall Cognitive Status: Within Functional Limits for tasks assessed    Balance  Static Standing Balance Static Standing - Balance Support: During functional activity Static Standing - Level of Assistance: 5: Stand by assistance High Level Balance High Level Balance Activites: Side stepping;Direction changes;Turns High Level Balance Comments: supervision, improved compliance with pracuations today.  End of Session PT - End of Session Equipment Utilized During Treatment: Gait belt;Back brace Activity Tolerance: Patient tolerated treatment well Patient left: in chair;with call bell/phone within reach;with family/visitor present;Other (comment) (With OT) Nurse Communication: Mobility status;Precautions;Other (comment)   GP     Fabio Asa 02/20/2013, 12:44 PM Charlotte Crumb, PT DPT  [phone]

## 2013-02-20 NOTE — Progress Notes (Signed)
Patient ID: Tonya Hall, female   DOB: [birth year], 62 y.o.   MRN: [id number] Some incisional pain with some drainage. hemovac came out yesterday. No leg pain.

## 2013-02-20 NOTE — Progress Notes (Signed)
Came to visit patient at bedside. She is active with [facility] DM management program. Reports she is pre-diabetic. States she is managing fine and does not think she need home health services at discharge. She will receive post hospital discharge call and will continued to be followed by [facility] coordinator. Appreciative of visit. Raiford Noble, MSN- Ed, RN,BSN- [facility] Liaison-[phone]

## 2013-02-20 NOTE — Progress Notes (Signed)
Occupational Therapy Treatment Patient Details Name: Tonya Hall MRN: [id number] DOB: [birth year] Today's Date: 02/20/2013 Time: 4098-1191 OT Time Calculation (min): 26 min  OT Assessment / Plan / Recommendation  History of present illness 62 yo female s/p PLIF L1-2   OT comments  Pt progressing well with OT and at adequate level for d/c home from OT standpoint. Pt without further questions.  Follow Up Recommendations  Home health OT;Supervision - Intermittent    Barriers to Discharge       Equipment Recommendations  None recommended by OT    Recommendations for Other Services    Frequency Min 2X/week   Progress towards OT Goals Progress towards OT goals: Progressing toward goals  Plan Discharge plan remains appropriate    Precautions / Restrictions Precautions Precautions: Back Precaution Comments: back handout provided and used teach back method Required Braces or Orthoses: Spinal Brace Spinal Brace: Lumbar corset;Applied in sitting position   Pertinent Vitals/Pain RN Yeni called and bringing medication    ADL  Eating/Feeding: Modified independent Where Assessed - Eating/Feeding: Chair Grooming: Wash/dry hands;Wash/dry face;Supervision/safety Where Assessed - Grooming: Unsupported standing Upper Body Bathing: Right arm;Chest;Left arm;Abdomen;Min guard Where Assessed - Upper Body Bathing: Unsupported standing Lower Body Bathing: Min guard Where Assessed - Lower Body Bathing: Unsupported sit to stand Upper Body Dressing: Supervision/safety Where Assessed - Upper Body Dressing: Unsupported standing Toilet Transfer: Min Pension scheme manager Method: Sit to Barista: Regular height toilet Toileting - Clothing Manipulation and Hygiene: Min guard Where Assessed - Engineer, mining and Hygiene: Sit to stand from 3-in-1 or toilet Equipment Used: Gait belt;Back brace;Rolling walker Transfers/Ambulation Related to ADLs: pt ambulating  supervision vision ADL Comments: Pt completed grooming at sink and completed bath. Pt wanting to shower but currently without orders. pt will have family (A) at home. Pt with all OT education complete at this time. Pt is safe to d/c home    OT Diagnosis:    OT Problem List:   OT Treatment Interventions:     OT Goals(current goals can now be found in the care plan section) Acute Rehab OT Goals Patient Stated Goal: To get back home OT Goal Formulation: With patient/family Time For Goal Achievement: 03/05/13 Potential to Achieve Goals: Good ADL Goals Pt Will Perform Grooming: with modified independence;standing Pt Will Perform Upper Body Dressing: with modified independence;standing Pt Will Perform Lower Body Dressing: with modified independence;with adaptive equipment;sit to/from stand Pt Will Transfer to Toilet: with modified independence;bedside commode  Visit Information  Last OT Received On: 02/20/13 Assistance Needed: +1 History of Present Illness: 62 yo female s/p PLIF L1-2    Subjective Data      Prior Functioning       Cognition  Cognition Arousal/Alertness: Awake/alert Behavior During Therapy: WFL for tasks assessed/performed Overall Cognitive Status: Within Functional Limits for tasks assessed    Mobility  Bed Mobility Bed Mobility: Not assessed Rolling Right: 4: Min guard Right Sidelying to Sit: 4: Min assist Sitting - Scoot to Edge of Bed: 5: Supervision Details for Bed Mobility Assistance: VCs for technique and positioning for log roll Transfers Transfers: Sit to Stand;Stand to Sit Sit to Stand: 5: Supervision Stand to Sit: 5: Supervision Details for Transfer Assistance: initial VC for hand placement    Exercises      Balance Static Standing Balance Static Standing - Balance Support: During functional activity Static Standing - Level of Assistance: 5: Stand by assistance High Level Balance High Level Balance Activites: Side stepping;Direction  changes;Turns  High Level Balance Comments: supervision, improved compliance with pracuations today.   End of Session OT - End of Session Activity Tolerance: Patient tolerated treatment well Patient left: in chair;with call bell/phone within reach Nurse Communication: Mobility status;Precautions  GO     Tonya Hall 02/20/2013, 2:31 PM Pager: [phone]

## 2013-02-20 NOTE — Progress Notes (Signed)
Talked to patient about DCP, physical therapy recommends HHPT/OT at discharge; patient stated " I do not want that". Patient stated that she does not need any DME - she has everything at home; CM following for DCP; B Shelba Flake

## 2013-02-21 MED ORDER — DIAZEPAM 5 MG PO TABS: 5.0000 mg | ORAL_TABLET | Freq: Four times a day (QID) | ORAL | Status: DC | PRN

## 2013-02-21 MED ORDER — OXYCODONE-ACETAMINOPHEN 5-325 MG PO TABS: 1.0000 | ORAL_TABLET | ORAL | Status: DC | PRN

## 2013-02-21 NOTE — Progress Notes (Signed)
Pt discharged per MD order and protocol. Discharge instructions reviewed with patient and all questions answered. Pt aware of follow up appointments and given prescriptions.  

## 2013-02-21 NOTE — Discharge Summary (Signed)
Physician Discharge Summary  Patient ID: Tonya Hall MRN: [id number] DOB/AGE: 07-Nov-1950 62 y.o.  Admit date: 02/18/2013 Discharge date: 02/21/2013  Admission Diagnoses:  Discharge Diagnoses:  Active Problems:   * No active hospital problems. *   Discharged Condition: good  Hospital Course: Patient admitted to the hospital where she underwent an uncomplicated L1 to decompression and fusion. Postoperatively she is doing well. Back and leg pain significantly better. Patient up ambulating and rate for discharge home.  Consults:   Significant Diagnostic Studies:   Treatments:   Discharge Exam: Blood pressure 154/83, pulse 102, temperature 98.8 F (37.1 C), temperature source Oral, resp. rate 18, height 4' 11.06" (1.5 m), weight 71.5 kg (157 lb 10.1 oz), SpO2 100.00%. Awake and alert. Oriented and appropriate. Cranial nerve function intact. Motor and sensory function extremities normal. Chest and abdomen benign. Wound healing well.  Disposition: 01-Home or Self Care   Future Appointments Provider Department Dept Phone   04/30/2013 11:30 AM Annamaria Boots, MD [facility] [phone]       Medication List         acetaminophen 500 MG tablet  Commonly known as:  TYLENOL  Take 500 mg by mouth daily as needed.     albuterol 108 (90 BASE) MCG/ACT inhaler  Commonly known as:  PROVENTIL HFA;VENTOLIN HFA  Inhale 1 puff into the lungs every 6 (six) hours as needed for wheezing or shortness of breath.     aspirin 81 MG tablet  Take 81 mg by mouth daily.     azithromycin 250 MG tablet  Commonly known as:  ZITHROMAX  Take 250-500 mg by mouth daily. Take 2 tablets by mouth on day 1, take 1 tablet by mouth days 2-5.     BONIVA 3 MG/3ML Soln injection  Generic drug:  ibandronate  Inject 3 mg into the vein every 3 (three) months.     calcium carbonate 600 MG Tabs tablet  Commonly known as:  OS-CAL  Take 600 mg by mouth 2 (two) times daily with a  meal.     CYMBALTA 60 MG capsule  Generic drug:  DULoxetine  Take 1 capsule (60 mg total) by mouth at bedtime.     diazepam 5 MG tablet  Commonly known as:  VALIUM  Take 1 tablet (5 mg total) by mouth every 6 (six) hours as needed for muscle spasms.     estradiol 25 MCG vaginal tablet  Commonly known as:  VAGIFEM  Place 1 tablet (25 mcg total) vaginally 2 (two) times a week.     fish oil-omega-3 fatty acids 1000 MG capsule  Take 2 g by mouth 2 (two) times daily.     FLEXERIL 5 MG tablet  Generic drug:  cyclobenzaprine  Take 5-10 mg by mouth 2 (two) times daily. Takes 10 mg in the am, and 5 mg in the pm.     FOLIC ACID PO  Take 1 tablet by mouth daily.     Ginger Root 500 MG Caps  Take 1 capsule by mouth daily.     Glucosamine Sulfate 1000 MG Caps  Take 2 capsules (2,000 mg total) by mouth daily.     hydrochlorothiazide 25 MG tablet  Commonly known as:  HYDRODIURIL  Take 25 mg by mouth daily.     Hydrocodone-Acetaminophen 10-660 MG Tabs  Take 0.5-1 tablets by mouth 2 (two) times daily as needed.     HYDROcodone-acetaminophen 10-325 MG per tablet  Commonly known as:  NORCO  Take 0.5 tablets by mouth every 6 (six) hours as needed for moderate pain.     leflunomide 20 MG tablet  Commonly known as:  ARAVA  Take 20 mg by mouth every other day.     metoprolol succinate 50 MG 24 hr tablet  Commonly known as:  TOPROL-XL  Take 1 tablet (50 mg total) by mouth daily. Take with or immediately following a meal.     multivitamin tablet  Take 1 tablet by mouth daily.     oxyCODONE-acetaminophen 5-325 MG per tablet  Commonly known as:  PERCOCET/ROXICET  Take 1-2 tablets by mouth every 4 (four) hours as needed for moderate pain.     pantoprazole 20 MG tablet  Commonly known as:  PROTONIX  Take 20 mg by mouth daily.     potassium chloride SA 20 MEQ tablet  Commonly known as:  K-DUR,KLOR-CON  Take 20 mEq by mouth 2 (two) times daily.     pravastatin 40 MG tablet  Commonly  known as:  PRAVACHOL  Take 40 mg by mouth daily.     predniSONE 5 MG tablet  Commonly known as:  DELTASONE  Take 5 mg by mouth daily.     Probiotic Caps  Take 1 capsule by mouth daily.     REMICADE IV  Inject into the vein.     spironolactone 25 MG tablet  Commonly known as:  ALDACTONE  Take 25 mg by mouth daily.     topiramate 200 MG tablet  Commonly known as:  TOPAMAX  Take 1 tablet (200 mg total) by mouth at bedtime.     traMADol 50 MG tablet  Commonly known as:  ULTRAM  Take 100 mg by mouth every 6 (six) hours as needed. Maximum dose= 8 tablets per day.  Current dose is 100mg  BID.     Turmeric 500 MG Caps  Take 1 capsule by mouth daily.           Follow-up Information   Call Karn Cassis, MD.   Specialty:  Neurosurgery   Contact information:   [address] [place] [address] [phone]       Signed: Temple Pacini 02/21/2013, 10:17 AM

## 2013-02-21 NOTE — Progress Notes (Signed)
Discard 3mL of Hydromorphone in sharps.  Tonya Hall witnessed discard in the sharps.

## 2013-02-21 NOTE — Progress Notes (Signed)
Physical Therapy Treatment Patient Details Name: Tonya Hall MRN: [id number] DOB: 02-22-1951 Today's Date: 02/21/2013 Time: 2952-8413 PT Time Calculation (min): 17 min  PT Assessment / Plan / Recommendation  History of Present Illness 62 yo female s/p PLIF L1-2   PT Comments   Pt making great progress toward goals with stair education completed today.  Follow Up Recommendations  Home health PT;Supervision/Assistance - 24 hour     Equipment Recommendations  None recommended by PT    Frequency Min 5X/week   Progress towards PT Goals Progress towards PT goals: Progressing toward goals  Plan Current plan remains appropriate    Precautions / Restrictions Precautions Precautions: Back Precaution Comments: pt able to recall 3/3 back precautions Required Braces or Orthoses: Spinal Brace Spinal Brace: Lumbar corset;Applied in sitting position (pt independent with brace management)    Mobility  Bed Mobility Bed Mobility: Not assessed Transfers Sit to Stand: 6: Modified independent (Device/Increase time);From toilet;With upper extremity assist Stand to Sit: 6: Modified independent (Device/Increase time);To chair/3-in-1;With upper extremity assist;With armrests Details for Transfer Assistance: no cues or assistance needed Ambulation/Gait Ambulation/Gait Assistance: 5: Supervision;6: Modified independent (Device/Increase time) Ambulation Distance (Feet): 300 Feet Assistive device: Rolling walker Gait Pattern: Within Functional Limits Gait velocity: decreased Stairs: Yes Stairs Assistance: 5: Supervision;4: Min guard Stairs Assistance Details (indicate cue type and reason): cues (verbal and visual) on technique with demo,pt able to return demo with min guard assist to supervision assist Stair Management Technique: One rail Right;Step to pattern;Sideways Number of Stairs: 12     PT Goals (current goals can now be found in the care plan section) Acute Rehab PT Goals Patient  Stated Goal: To get back home PT Goal Formulation: With patient Time For Goal Achievement: 02/26/13 Potential to Achieve Goals: Good  Visit Information  Last PT Received On: 02/21/13 Assistance Needed: +1 History of Present Illness: 62 yo female s/p PLIF L1-2    Subjective Data  Patient Stated Goal: To get back home   Cognition  Cognition Arousal/Alertness: Awake/alert Behavior During Therapy: WFL for tasks assessed/performed Overall Cognitive Status: Within Functional Limits for tasks assessed    End of Session PT - End of Session Equipment Utilized During Treatment: Gait belt;Back brace Activity Tolerance: Patient tolerated treatment well Patient left: in chair;with call bell/phone within reach;with family/visitor present Nurse Communication: Mobility status;Precautions   GP     Sallyanne Kuster 02/21/2013, 2:26 PM

## 2013-04-28 ENCOUNTER — Encounter: Payer: Self-pay | Admitting: Obstetrics & Gynecology

## 2013-04-30 ENCOUNTER — Encounter: Payer: Self-pay | Admitting: Obstetrics & Gynecology

## 2013-04-30 ENCOUNTER — Ambulatory Visit (INDEPENDENT_AMBULATORY_CARE_PROVIDER_SITE_OTHER): Payer: Commercial Managed Care - PPO | Admitting: Obstetrics & Gynecology

## 2013-04-30 VITALS — BP 138/74 | HR 60 | Resp 16 | Ht 59.5 in | Wt 149.8 lb

## 2013-04-30 DIAGNOSIS — Z124 Encounter for screening for malignant neoplasm of cervix: Secondary | ICD-10-CM

## 2013-04-30 DIAGNOSIS — Z01419 Encounter for gynecological examination (general) (routine) without abnormal findings: Secondary | ICD-10-CM

## 2013-04-30 NOTE — Progress Notes (Signed)
63 y.o. G3P2 MarriedCaucasianF here for annual exam.  Grandson, from [place], and granddaughter were together for the holidays.  42 year old grandson flies alone with airline help.  He will come again in the summer.    Stopped using vagifem.  Really hasn't noticed a difference.  Wants to know if I think she should stay off of it.  Patient's last menstrual period was 02/01/1999.          Sexually active: yes  The current method of family planning is tubal ligation.    Exercising: no  not regularly Smoker:  no  Health Maintenance: Pap:  01/31/12 WNL/negative HR HPV History of abnormal Pap:  no MMG:  02/12/13 normal, grade 3 breast density Colonoscopy:  3/10 repeat in 10 years-sigmoid in 2012 for diarrhea, Dr. Gala Romney BMD:   9/14 TDaP:  2008 Screening Labs: PCP, Hb today: PCP, Urine today: PCP   reports that she has never smoked. She has never used smokeless tobacco. She reports that she does not drink alcohol or use illicit drugs.  Past Medical History  Diagnosis Date  . Lymphocytic colitis 05/26/10 TCS RMR    Responded to Entocort x 3 MOS  . Psoriatic arthritis     Dr. Katherina Right  . Degenerative disk disease   . Raynaud's disease   . Seasonal allergies   . Osteoporosis     gets Boniva every 3 months  . Hypokalemia   . Esophageal motility disorder     Non-specific, see modified barium study/speech path, BP  . Hyperlipidemia     takes Pravastatin daily  . PONV (postoperative nausea and vomiting)   . HTN (hypertension)     takes Metoprolol daily and SPironolactone daily  . Peripheral edema     takes HCTZ daily  . Shortness of breath     with exertion  . Asthma     Albuterol in haler prn  . History of bronchitis     last time early 2014  . Neuropathy   . Chronic back pain   . GERD (gastroesophageal reflux disease)     takes Protonix bid  . Inflammatory bowel disease (ulcerative colitis)   . Urinary urgency   . Anemia     at times  . History of blood transfusion      did have a rash after receiving the blood  . Diabetes mellitus without complication     borderline  . Cataract     immature unsure which eye  . Monilia infection 12/93  . SUI (stress urinary incontinence, female)   . Hypercholesterolemia   . History of blood transfusion     post c-section    Past Surgical History  Procedure Laterality Date  . Cesarean section      2  . Tonsillectomy and adenoidectomy    . Lumbar disc surgery    . Lumbar fusion      x 3  . Dilation and curettage of uterus      spontaneous abortion  . Colonoscopy  2012 diarrhea    microscopic colitis  . Colonoscopy  2010 screening    [place]  Tics, Sml IH  . Upper gastrointestinal endoscopy  MAR 2010 DYSPEPSIA    REACTIVE GASTROPATHY  . Colonoscopy  05/26/2010    [id number] rectum, s/p biopsy/Sigmoid descending diverticula, otherwise normal-appearing colonic  . Lumbar fusion  02/18/13    L1/L2    Current Outpatient Prescriptions  Medication Sig Dispense Refill  . albuterol (PROVENTIL HFA;VENTOLIN HFA) 108 (90 BASE)  MCG/ACT inhaler Inhale 1 puff into the lungs every 6 (six) hours as needed for wheezing or shortness of breath.      Marland Kitchen aspirin 81 MG tablet Take 81 mg by mouth daily.        . calcium carbonate (OS-CAL) 600 MG TABS Take 600 mg by mouth 2 (two) times daily with a meal.       . cyclobenzaprine (FLEXERIL) 5 MG tablet Take 5-10 mg by mouth 2 (two) times daily. Takes 10 mg in the am, and 5 mg in the pm.      . DULoxetine (CYMBALTA) 60 MG capsule Take 1 capsule (60 mg total) by mouth at bedtime.      . fish oil-omega-3 fatty acids 1000 MG capsule Take 2 g by mouth 2 (two) times daily.       Marland Kitchen FOLIC ACID PO Take 1 tablet by mouth daily.      . Ginger, Zingiber officinalis, (GINGER ROOT) 500 MG CAPS Take 1 capsule by mouth daily.       . Glucosamine Sulfate 1000 MG CAPS Take 2 capsules (2,000 mg total) by mouth daily.      Marland Kitchen HYDROcodone-acetaminophen (NORCO) 10-325 MG per tablet Take 0.5 tablets by  mouth every 6 (six) hours as needed for moderate pain.      . InFLIXimab (REMICADE IV) Inject into the vein.      Marland Kitchen leflunomide (ARAVA) 20 MG tablet Take 10 mg by mouth every other day.       . metoprolol succinate (TOPROL-XL) 50 MG 24 hr tablet Take 1 tablet (50 mg total) by mouth daily. Take with or immediately following a meal.      . Multiple Vitamin (MULTIVITAMIN) tablet Take 1 tablet by mouth daily.        . pantoprazole (PROTONIX) 20 MG tablet Take 20 mg by mouth daily.      . potassium chloride SA (K-DUR,KLOR-CON) 20 MEQ tablet Take 20 mEq by mouth 2 (two) times daily.      . pravastatin (PRAVACHOL) 40 MG tablet Take 40 mg by mouth daily.      . predniSONE (DELTASONE) 5 MG tablet Take 5 mg by mouth daily.       Marland Kitchen PROBIOTIC CAPS Take 1 capsule by mouth daily.      Marland Kitchen spironolactone (ALDACTONE) 25 MG tablet Take 25 mg by mouth daily.       Marland Kitchen topiramate (TOPAMAX) 200 MG tablet Take 1 tablet (200 mg total) by mouth at bedtime.      . Turmeric 500 MG CAPS Take 1 capsule by mouth daily.       Marland Kitchen acetaminophen (TYLENOL) 500 MG tablet Take 500 mg by mouth daily as needed.      . diazepam (VALIUM) 5 MG tablet Take 1 tablet (5 mg total) by mouth every 6 (six) hours as needed for muscle spasms.  30 tablet  0  . estradiol (VAGIFEM) 25 MCG vaginal tablet Place 1 tablet (25 mcg total) vaginally 2 (two) times a week.      . hydrochlorothiazide (HYDRODIURIL) 25 MG tablet Take 25 mg by mouth daily.       Marland Kitchen ibandronate (BONIVA) 3 MG/3ML SOLN injection Inject 3 mg into the vein every 3 (three) months.        . traMADol (ULTRAM) 50 MG tablet Take 100 mg by mouth every 6 (six) hours as needed. Maximum dose= 8 tablets per day.  Current dose is 100mg  BID.      . [  DISCONTINUED] Glucosamine 500 MG TABS Take 4 tablets by mouth daily.        . [DISCONTINUED] simvastatin (ZOCOR) 40 MG tablet Take 20 mg by mouth at bedtime.        No current facility-administered medications for this visit.    Family History   Problem Relation Age of Onset  . Rheum arthritis Mother   . Diabetes Mother   . Rheumatic fever Father   . Colon cancer Neg Hx   . Colon polyps Neg Hx   . Diabetes      grandparents  . Skin cancer      grandfather  . Hypertension      grandparent  . Congestive Heart Failure      grandfather  . Parkinson's disease      grandmother  . Pancreatitis Mother     ROS:  Pertinent items are noted in HPI.  Otherwise, a comprehensive ROS was negative.  Exam:   BP 138/74  Pulse 60  Resp 16  Ht 4' 11.5" (1.511 m)  Wt 149 lb 12.8 oz (67.949 kg)  BMI 29.76 kg/m2  LMP 02/01/1999  Weight change: -9lbs  Height: 4' 11.5" (151.1 cm)  Ht Readings from Last 3 Encounters:  04/30/13 4' 11.5" (1.511 m)  02/18/13 4' 11.06" (1.5 m)  02/18/13 4' 11.06" (1.5 m)    General appearance: alert, cooperative and appears stated age Head: Normocephalic, without obvious abnormality, atraumatic Neck: no adenopathy, supple, symmetrical, trachea midline and thyroid normal to inspection and palpation Lungs: clear to auscultation bilaterally Breasts: normal appearance, no masses or tenderness Heart: regular rate and rhythm Abdomen: soft, non-tender; bowel sounds normal; no masses,  no organomegaly Extremities: extremities normal, atraumatic, no cyanosis or edema Skin: Skin color, texture, turgor normal. No rashes or lesions Lymph nodes: Cervical, supraclavicular, and axillary nodes normal. No abnormal inguinal nodes palpated Neurologic: Grossly normal   Pelvic: External genitalia:  no lesions              Urethra:  normal appearing urethra with no masses, tenderness or lesions              Bartholins and Skenes: normal                 Vagina: normal appearing vagina with normal color and discharge, no lesions              Cervix: no lesions              Pap taken: no Bimanual Exam:  Uterus:  normal size, contour, position, consistency, mobility, non-tender              Adnexa: normal adnexa and no  mass, fullness, tenderness               Rectovaginal: Confirms               Anus:  normal sphincter tone, no lesions  A:  Well Woman with normal exam PMP, no HRT Psoriatic arthritis Chronic immune suppression Atrophic vaginal changes  P:   Mammogram yearly.  3D MMG discussed with patient pap smear with neg HR HPV 10/13.  Pap today. Labs with Drs. Alfredo Batty, and Deveshwar return annually or prn  An After Visit Summary was printed and given to the patient.

## 2013-04-30 NOTE — Patient Instructions (Signed)

## 2013-05-01 LAB — IPS PAP SMEAR ONLY

## 2013-07-21 ENCOUNTER — Other Ambulatory Visit: Payer: Self-pay | Admitting: Gastroenterology

## 2013-09-24 ENCOUNTER — Other Ambulatory Visit: Payer: Self-pay | Admitting: Dermatology

## 2014-02-01 ENCOUNTER — Encounter: Payer: Self-pay | Admitting: Obstetrics & Gynecology

## 2014-02-17 ENCOUNTER — Other Ambulatory Visit: Payer: Self-pay | Admitting: Dermatology

## 2014-02-22 ENCOUNTER — Other Ambulatory Visit: Payer: Self-pay | Admitting: Obstetrics & Gynecology

## 2014-02-22 DIAGNOSIS — Z1231 Encounter for screening mammogram for malignant neoplasm of breast: Secondary | ICD-10-CM

## 2014-03-03 NOTE — Discharge Instructions (Signed)

## 2014-03-04 ENCOUNTER — Encounter (HOSPITAL_COMMUNITY)
Admission: RE | Admit: 2014-03-04 | Discharge: 2014-03-04 | Disposition: A | Discharge status: Home / Self Care | Payer: 59 | Source: Ambulatory Visit | Attending: Internal Medicine | Admitting: Internal Medicine

## 2014-03-04 DIAGNOSIS — M810 Age-related osteoporosis without current pathological fracture: Secondary | ICD-10-CM | POA: Diagnosis present

## 2014-03-04 LAB — COMPREHENSIVE METABOLIC PANEL
ALT: 40 U/L — ABNORMAL HIGH (ref 0–35)
AST: 35 U/L (ref 0–37)
Albumin: 3.6 g/dL (ref 3.5–5.2)
Alkaline Phosphatase: 58 U/L (ref 39–117)
Anion gap: 12 (ref 5–15)
BUN: 16 mg/dL (ref 6–23)
CO2: 21 mEq/L (ref 19–32)
Calcium: 9.4 mg/dL (ref 8.4–10.5)
Chloride: 104 mEq/L (ref 96–112)
Creatinine, Ser: 1.01 mg/dL (ref 0.50–1.10)
GFR calc Af Amer: 67 mL/min — ABNORMAL LOW (ref >90)
GFR calc non Af Amer: 58 mL/min — ABNORMAL LOW (ref >90)
Glucose, Bld: 161 mg/dL — ABNORMAL HIGH (ref 70–99)
Potassium: 4 mEq/L (ref 3.7–5.3)
Sodium: 137 mEq/L (ref 137–147)
Total Bilirubin: 0.2 mg/dL — ABNORMAL LOW (ref 0.3–1.2)
Total Protein: 7.3 g/dL (ref 6.0–8.3)

## 2014-03-04 MED ORDER — ZOLEDRONIC ACID 5 MG/100ML IV SOLN
5.0000 mg | Freq: Once | INTRAVENOUS | Status: AC
  Administered 2014-03-04: 5 mg via INTRAVENOUS

## 2014-03-04 MED ORDER — ZOLEDRONIC ACID 5 MG/100ML IV SOLN
INTRAVENOUS | Status: AC
  Filled 2014-03-04: qty 100

## 2014-03-04 NOTE — Progress Notes (Signed)
Lab results faxed to Dr Willey Blade. Printed info regarding Reclast given to pt.

## 2014-03-15 ENCOUNTER — Ambulatory Visit (HOSPITAL_COMMUNITY)
Admission: RE | Admit: 2014-03-15 | Discharge: 2014-03-15 | Disposition: A | Discharge status: Home / Self Care | Payer: 59 | Source: Ambulatory Visit | Attending: Obstetrics & Gynecology | Admitting: Obstetrics & Gynecology

## 2014-03-15 DIAGNOSIS — Z1231 Encounter for screening mammogram for malignant neoplasm of breast: Secondary | ICD-10-CM | POA: Diagnosis present

## 2014-04-09 ENCOUNTER — Encounter (HOSPITAL_COMMUNITY)
Admission: RE | Admit: 2014-04-09 | Discharge: 2014-04-09 | Disposition: A | Discharge status: Home / Self Care | Payer: 59 | Source: Ambulatory Visit | Attending: Rheumatology | Admitting: Rheumatology

## 2014-04-09 DIAGNOSIS — L4050 Arthropathic psoriasis, unspecified: Secondary | ICD-10-CM | POA: Diagnosis present

## 2014-04-09 MED ORDER — DIPHENHYDRAMINE HCL 25 MG PO TABS
25.0000 mg | ORAL_TABLET | Freq: Once | ORAL | Status: DC
  Filled 2014-04-09: qty 1

## 2014-04-09 MED ORDER — ACETAMINOPHEN 325 MG PO TABS: 650.0000 mg | ORAL_TABLET | Freq: Four times a day (QID) | ORAL | Status: DC | PRN

## 2014-04-09 MED ORDER — INFLIXIMAB 100 MG IV SOLR
400.0000 mg | Freq: Once | INTRAVENOUS | Status: AC
  Administered 2014-04-09: 400 mg via INTRAVENOUS
  Filled 2014-04-09: qty 40

## 2014-04-09 MED ORDER — SODIUM CHLORIDE 0.9 % IV SOLN
INTRAVENOUS | Status: DC
Start: 2014-04-09 — End: 2014-04-10
  Administered 2014-04-09: 250 mL via INTRAVENOUS

## 2014-04-09 NOTE — Progress Notes (Signed)
Here for remicade infusion. Dx psoriatic arthritis.

## 2014-04-09 NOTE — Progress Notes (Signed)
remicade completed. Refuses to stay for recovery time. "It does not bother me and I never stay. I always leave when it is done."

## 2014-05-11 ENCOUNTER — Ambulatory Visit: Payer: Commercial Managed Care - PPO | Admitting: Obstetrics & Gynecology

## 2014-05-20 ENCOUNTER — Encounter (HOSPITAL_COMMUNITY)
Admission: RE | Admit: 2014-05-20 | Discharge: 2014-05-20 | Disposition: A | Discharge status: Home / Self Care | Payer: 59 | Source: Ambulatory Visit | Attending: Rheumatology | Admitting: Rheumatology

## 2014-05-20 DIAGNOSIS — L4050 Arthropathic psoriasis, unspecified: Secondary | ICD-10-CM | POA: Insufficient documentation

## 2014-05-20 MED ORDER — SODIUM CHLORIDE 0.9 % IV SOLN
INTRAVENOUS | Status: DC
  Administered 2014-05-20: 200 mL via INTRAVENOUS

## 2014-05-20 MED ORDER — SODIUM CHLORIDE 0.9 % IV SOLN
200.0000 mg | Freq: Once | INTRAVENOUS | Status: DC
  Filled 2014-05-20: qty 16

## 2014-05-20 MED ORDER — SODIUM CHLORIDE 0.9 % IV SOLN
400.0000 mg | INTRAVENOUS | Status: DC
  Administered 2014-05-20: 400 mg via INTRAVENOUS
  Filled 2014-05-20: qty 40

## 2014-05-20 MED ORDER — SODIUM CHLORIDE 0.9 % IV SOLN: 400.0000 mg | Freq: Once | INTRAVENOUS | Status: DC

## 2014-05-20 NOTE — Progress Notes (Signed)
Pt refuses premeds.

## 2014-06-07 ENCOUNTER — Encounter: Payer: Self-pay | Admitting: Obstetrics & Gynecology

## 2014-06-07 ENCOUNTER — Ambulatory Visit (INDEPENDENT_AMBULATORY_CARE_PROVIDER_SITE_OTHER): Payer: 59 | Admitting: Obstetrics & Gynecology

## 2014-06-07 VITALS — BP 142/66 | HR 78 | Ht 58.75 in | Wt 160.2 lb

## 2014-06-07 DIAGNOSIS — Z01419 Encounter for gynecological examination (general) (routine) without abnormal findings: Secondary | ICD-10-CM | POA: Diagnosis not present

## 2014-06-07 DIAGNOSIS — Z0000 Encounter for general adult medical examination without abnormal findings: Secondary | ICD-10-CM | POA: Diagnosis not present

## 2014-06-07 LAB — POCT URINALYSIS DIPSTICK
Bilirubin, UA: NEGATIVE
Blood, UA: NEGATIVE
Glucose, UA: NEGATIVE
Ketones, UA: NEGATIVE
Leukocytes, UA: NEGATIVE
Nitrite, UA: NEGATIVE
Protein, UA: NEGATIVE
Urobilinogen, UA: NEGATIVE
pH, UA: 5

## 2014-06-07 LAB — CBC
HCT: 40.8 % (ref 36.0–46.0)
Hemoglobin: 13.1 g/dL (ref 12.0–15.0)
MCH: 29.2 pg (ref 26.0–34.0)
MCHC: 32.1 g/dL (ref 30.0–36.0)
MCV: 91.1 fL (ref 78.0–100.0)
MPV: 11.3 fL (ref 8.6–12.4)
Platelets: 256 10*3/uL (ref 150–400)
RBC: 4.48 MIL/uL (ref 3.87–5.11)
RDW: 14.7 % (ref 11.5–15.5)
WBC: 11.1 10*3/uL — ABNORMAL HIGH (ref 4.0–10.5)

## 2014-06-07 NOTE — Progress Notes (Signed)
64 y.o. G3P2 MarriedCaucasianF here for annual exam.  Doing well.  Family is doing well.    Started Reclast last year with Dr. Chalmers Cater.  Has appt scheduled for this already.    No vaginal bleeding.    Patient's last menstrual period was 02/01/1999.          Sexually active: Yes.    The current method of family planning is tubal ligation.    Exercising: No.  not regulalry Smoker:  no  Health Maintenance: Pap:  04/30/13 WNL History of abnormal Pap: no MMG: 03/15/14 Colonoscopy: 3/10 repeat in 10 years-sigmoid in 2012 for diarrhea, Dr. Gala Romney BMD:10/2013 (Dr. Chalmers Cater) TDaP: 2008 Screening Labs: PCP, Hb today: PCP, Urine today: neg    reports that she has never smoked. She has never used smokeless tobacco. She reports that she does not drink alcohol or use illicit drugs.  Past Medical History  Diagnosis Date  . Lymphocytic colitis 05/26/10 TCS RMR    Responded to Entocort x 3 MOS  . Psoriatic arthritis     Dr. Katherina Right  . Degenerative disk disease   . Raynaud's disease   . Seasonal allergies   . Osteoporosis     gets Boniva every 3 months  . Hypokalemia   . Esophageal motility disorder     Non-specific, see modified barium study/speech path, BP  . Hyperlipidemia     takes Pravastatin daily  . PONV (postoperative nausea and vomiting)   . HTN (hypertension)     takes Metoprolol daily and SPironolactone daily  . Peripheral edema     takes HCTZ daily  . Shortness of breath     with exertion  . Asthma     Albuterol in haler prn  . History of bronchitis     last time early 2014  . Neuropathy   . Chronic back pain   . GERD (gastroesophageal reflux disease)     takes Protonix bid  . Inflammatory bowel disease (ulcerative colitis)   . Urinary urgency   . Anemia     at times  . History of blood transfusion     did have a rash after receiving the blood  . Diabetes mellitus without complication     borderline  . Cataract     immature unsure which eye  . Monilia  infection 12/93  . SUI (stress urinary incontinence, female)   . Hypercholesterolemia   . History of blood transfusion     post c-section    Past Surgical History  Procedure Laterality Date  . Cesarean section  1974, 1978       . Tonsillectomy and adenoidectomy    . Lumbar fusion  6/03, 11/08    L4-5 fusion, L2-3 fusion  . Dilation and curettage of uterus      spontaneous abortion  . Lumbar fusion  02/18/13    L1-2 fusion  . Dilation and curettage of uterus  1977  . Lumbar disc arthroplasty  7/98    Current Outpatient Prescriptions  Medication Sig Dispense Refill  . albuterol (PROVENTIL HFA;VENTOLIN HFA) 108 (90 BASE) MCG/ACT inhaler Inhale 1 puff into the lungs every 6 (six) hours as needed for wheezing or shortness of breath.    Marland Kitchen aspirin 81 MG tablet Take 81 mg by mouth daily.      . calcium carbonate (OS-CAL) 600 MG TABS Take 600 mg by mouth 2 (two) times daily with a meal.     . cyclobenzaprine (FLEXERIL) 5 MG tablet Take 5-10 mg by  mouth 2 (two) times daily. Takes 10 mg in the am, and 5 mg in the pm.    . DULoxetine (CYMBALTA) 60 MG capsule Take 1 capsule (60 mg total) by mouth at bedtime.    . fish oil-omega-3 fatty acids 1000 MG capsule Take 2 g by mouth 2 (two) times daily.     Marland Kitchen FOLIC ACID PO Take 1 tablet by mouth daily.    . furosemide (LASIX) 20 MG tablet Take 20 mg by mouth as needed.    . Ginger, Zingiber officinalis, (GINGER ROOT) 500 MG CAPS Take 1 capsule by mouth daily.     . Glucosamine Sulfate 1000 MG CAPS Take 2 capsules (2,000 mg total) by mouth daily.    Marland Kitchen HYDROcodone-acetaminophen (NORCO) 10-325 MG per tablet Take 0.5 tablets by mouth every 6 (six) hours as needed for moderate pain.    . InFLIXimab (REMICADE IV) Inject into the vein.    Marland Kitchen leflunomide (ARAVA) 20 MG tablet Take 10 mg by mouth. 10mg  6 days/weekly    . metoprolol succinate (TOPROL-XL) 50 MG 24 hr tablet Take 1 tablet (50 mg total) by mouth daily. Take with or immediately following a meal.     . Misc Natural Products (TART CHERRY ADVANCED PO) Take by mouth. daily    . Multiple Vitamin (MULTIVITAMIN) tablet Take 1 tablet by mouth daily.      . pantoprazole (PROTONIX) 40 MG tablet TAKE 1 TABLET BY MOUTH 2 TIMES DAILY BEFORE A MEAL. 180 tablet 3  . potassium chloride SA (K-DUR,KLOR-CON) 20 MEQ tablet Take 20 mEq by mouth 2 (two) times daily.    . pravastatin (PRAVACHOL) 40 MG tablet Take 40 mg by mouth daily.    . predniSONE (DELTASONE) 5 MG tablet Take 5 mg by mouth daily.     Marland Kitchen PROBIOTIC CAPS Take 1 capsule by mouth daily.    Marland Kitchen spironolactone (ALDACTONE) 25 MG tablet Take 25 mg by mouth daily.     Marland Kitchen topiramate (TOPAMAX) 200 MG tablet Take 1 tablet (200 mg total) by mouth at bedtime.    . Turmeric 500 MG CAPS Take 1 capsule by mouth daily.     . Zoledronic Acid (RECLAST IV) Inject into the vein.    Marland Kitchen estradiol (VAGIFEM) 25 MCG vaginal tablet Place 1 tablet (25 mcg total) vaginally 2 (two) times a week.    . ondansetron (ZOFRAN-ODT) 8 MG disintegrating tablet   1  . [DISCONTINUED] Glucosamine 500 MG TABS Take 4 tablets by mouth daily.      . [DISCONTINUED] simvastatin (ZOCOR) 40 MG tablet Take 20 mg by mouth at bedtime.      No current facility-administered medications for this visit.    Family History  Problem Relation Age of Onset  . Rheum arthritis Mother   . Diabetes Mother   . Rheumatic fever Father   . Colon cancer Neg Hx   . Colon polyps Neg Hx   . Diabetes      grandparents  . Skin cancer      grandfather  . Hypertension      grandparent  . Congestive Heart Failure      grandfather  . Parkinson's disease      grandmother  . Pancreatitis Mother     ROS:  Pertinent items are noted in HPI.  Otherwise, a comprehensive ROS was negative.  Exam:   BP 142/66 mmHg  Pulse 78  Ht 4' 10.75" (1.492 m)  Wt 160 lb 3.2 oz (72.666 kg)  BMI  32.64 kg/m2  LMP 02/01/1999  Weight change: +11#  Height: 4' 10.75" (149.2 cm)  Ht Readings from Last 3 Encounters:  06/07/14 4'  10.75" (1.492 m)  03/04/14 4' 11.5" (1.511 m)  04/30/13 4' 11.5" (1.511 m)    General appearance: alert, cooperative and appears stated age Head: Normocephalic, without obvious abnormality, atraumatic Neck: no adenopathy, supple, symmetrical, trachea midline and thyroid normal to inspection and palpation Lungs: clear to auscultation bilaterally Breasts: normal appearance, no masses or tenderness Heart: regular rate and rhythm Abdomen: soft, non-tender; bowel sounds normal; no masses,  no organomegaly Extremities: extremities normal, atraumatic, no cyanosis or edema Skin: Skin color, texture, turgor normal. Petechial appearing rash waist to upper thighs Lymph nodes: Cervical, supraclavicular, and axillary nodes normal. No abnormal inguinal nodes palpated Neurologic: Grossly normal   Pelvic: External genitalia:  no lesions              Urethra:  normal appearing urethra with no masses, tenderness or lesions              Bartholins and Skenes: normal                 Vagina: normal appearing vagina with normal color and discharge, no lesions              Cervix: no lesions              Pap taken: No. Bimanual Exam:  Uterus:  normal size, contour, position, consistency, mobility, non-tender              Adnexa: normal adnexa and no mass, fullness, tenderness               Rectovaginal: Confirms               Anus:  normal sphincter tone, no lesions  Chaperone was present for exam.  A:  Well Woman with normal exam PMP, no HRT Psoriatic arthritis Chronic immune suppression Elevated lipids and elevated triglycerides Atrophic vaginal changes.  Used vagifem in the past.  Off for several years. Petechial appearing rash  P: Mammogram yearly. pap smear with neg HR HPV 10/13. Normal pap 2015.  No pap today. Labs with Drs. Balan (yearly), Fagan (every 4 months), and Deveshwar (every 4-6 months) CBC today return annually or prn

## 2014-06-09 ENCOUNTER — Telehealth: Payer: Self-pay | Admitting: Obstetrics & Gynecology

## 2014-06-09 NOTE — Telephone Encounter (Signed)
Spoke with patient. Results given as seen below from [place]. Patient is agreeable and verbalizes understanding. Will schedule follow up appointment with PCP or dermatologist.  Notes Recorded by Megan Salon, MD on 06/07/2014 at 9:32 PM Inform this is normal except mildly elevated WBC ct which isn't abnormal for. I want her to either see her PCP or dermatologist. Thanks.  Routing to provider for final review. Patient agreeable to disposition. Will close encounter

## 2014-06-09 NOTE — Telephone Encounter (Signed)
Patient calling for recent results.

## 2014-07-01 ENCOUNTER — Inpatient Hospital Stay (HOSPITAL_COMMUNITY): Admission: RE | Admit: 2014-07-01 | Payer: 59 | Source: Ambulatory Visit

## 2014-07-06 ENCOUNTER — Encounter (HOSPITAL_COMMUNITY)
Admission: RE | Admit: 2014-07-06 | Discharge: 2014-07-06 | Disposition: A | Discharge status: Home / Self Care | Payer: 59 | Source: Ambulatory Visit | Attending: Rheumatology | Admitting: Rheumatology

## 2014-07-06 ENCOUNTER — Encounter (HOSPITAL_COMMUNITY): Payer: Self-pay

## 2014-07-06 DIAGNOSIS — L4050 Arthropathic psoriasis, unspecified: Secondary | ICD-10-CM | POA: Insufficient documentation

## 2014-07-06 MED ORDER — SODIUM CHLORIDE 0.9 % IV SOLN
6.0000 mg/kg | Freq: Once | INTRAVENOUS | Status: AC
  Administered 2014-07-06: 400 mg via INTRAVENOUS
  Filled 2014-07-06: qty 40

## 2014-07-06 MED ORDER — DIPHENHYDRAMINE HCL 25 MG PO TABS: 50.0000 mg | ORAL_TABLET | Freq: Once | ORAL | Status: DC

## 2014-07-06 MED ORDER — SODIUM CHLORIDE 0.9 % IV SOLN
Freq: Once | INTRAVENOUS | Status: AC
  Administered 2014-07-06: 250 mL via INTRAVENOUS

## 2014-07-06 MED ORDER — ACETAMINOPHEN 325 MG PO TABS: 650.0000 mg | ORAL_TABLET | Freq: Once | ORAL | Status: DC

## 2014-07-30 ENCOUNTER — Encounter: Payer: Self-pay | Admitting: *Deleted

## 2014-07-30 ENCOUNTER — Other Ambulatory Visit: Payer: Self-pay | Admitting: Gastroenterology

## 2014-08-17 ENCOUNTER — Encounter: Payer: Self-pay | Admitting: *Deleted

## 2014-08-17 ENCOUNTER — Encounter (HOSPITAL_COMMUNITY)
Admission: RE | Admit: 2014-08-17 | Discharge: 2014-08-17 | Disposition: A | Discharge status: Home / Self Care | Payer: 59 | Source: Ambulatory Visit | Attending: Rheumatology | Admitting: Rheumatology

## 2014-08-17 DIAGNOSIS — L4050 Arthropathic psoriasis, unspecified: Secondary | ICD-10-CM | POA: Insufficient documentation

## 2014-08-17 MED ORDER — SODIUM CHLORIDE 0.9 % IV SOLN
400.0000 mg | Freq: Once | INTRAVENOUS | Status: AC
  Administered 2014-08-17: 400 mg via INTRAVENOUS
  Filled 2014-08-17: qty 40

## 2014-08-17 MED ORDER — SODIUM CHLORIDE 0.9 % IV SOLN
Freq: Once | INTRAVENOUS | Status: AC
  Administered 2014-08-17: 250 mL via INTRAVENOUS

## 2014-08-17 NOTE — Progress Notes (Signed)
Refuses tylenol and benadryl before infusion.

## 2014-08-17 NOTE — Progress Notes (Signed)
Here for remicade infusion. Dx psoriatic arthritis.

## 2014-09-28 ENCOUNTER — Encounter (HOSPITAL_COMMUNITY)
Admission: RE | Admit: 2014-09-28 | Discharge: 2014-09-28 | Disposition: A | Discharge status: Home / Self Care | Payer: 59 | Source: Ambulatory Visit | Attending: Rheumatology | Admitting: Rheumatology

## 2014-09-28 ENCOUNTER — Encounter (HOSPITAL_COMMUNITY): Payer: Self-pay

## 2014-09-28 DIAGNOSIS — L4050 Arthropathic psoriasis, unspecified: Secondary | ICD-10-CM | POA: Diagnosis present

## 2014-09-28 MED ORDER — SODIUM CHLORIDE 0.9 % IV SOLN
INTRAVENOUS | Status: DC
  Administered 2014-09-28: 10:00:00 via INTRAVENOUS

## 2014-09-28 MED ORDER — SODIUM CHLORIDE 0.9 % IV SOLN
400.0000 mg | INTRAVENOUS | Status: DC
  Administered 2014-09-28: 400 mg via INTRAVENOUS
  Filled 2014-09-28: qty 40

## 2014-10-27 ENCOUNTER — Other Ambulatory Visit: Payer: Self-pay | Admitting: *Deleted

## 2014-10-27 NOTE — Patient Outreach (Signed)
[place] called to inform this RNCM that she will not be returning to work at [employer] as she has qualified for long term disability due to her rheumatoid arthritis. She remains on the [facility] health plan at present but says she will eventually transition to long term Medicare disability. She says she no longer checks her blood sugar because her A1C has remained fairly stable and without significant increase despite chronic steroid therapy. She says she agrees to disenroll from the [employer] program. Will advise [facility]ville Care, care management assistant, to disenroll Odie from the program. She is also requesting assistance with an outstanding bill for a routine drug urine test that was done in February 2016 . This RNCM referred her to [facility] benefits specialist Delana Ramey and advised her to contact this RNCM for further questions or concerns. Barrington Ellison RN,CCM,CDE [place] Management Coordinator [facility] To Well[facility] Office Phone [phone] Office Fax [phone]

## 2014-11-09 ENCOUNTER — Encounter (HOSPITAL_COMMUNITY)
Admission: RE | Admit: 2014-11-09 | Discharge: 2014-11-09 | Disposition: A | Discharge status: Home / Self Care | Payer: 59 | Source: Ambulatory Visit | Attending: Rheumatology | Admitting: Rheumatology

## 2014-11-09 DIAGNOSIS — L4050 Arthropathic psoriasis, unspecified: Secondary | ICD-10-CM | POA: Insufficient documentation

## 2014-11-09 LAB — COMPREHENSIVE METABOLIC PANEL
ALT: 39 U/L (ref 14–54)
AST: 35 U/L (ref 15–41)
Albumin: 3.9 g/dL (ref 3.5–5.0)
Alkaline Phosphatase: 46 U/L (ref 38–126)
Anion gap: 9 (ref 5–15)
BUN: 19 mg/dL (ref 6–20)
CO2: 22 mmol/L (ref 22–32)
Calcium: 9 mg/dL (ref 8.9–10.3)
Chloride: 108 mmol/L (ref 101–111)
Creatinine, Ser: 0.91 mg/dL (ref 0.44–1.00)
GFR calc Af Amer: >60 mL/min (ref >60)
GFR calc non Af Amer: >60 mL/min (ref >60)
Glucose, Bld: 176 mg/dL — ABNORMAL HIGH (ref 65–99)
Potassium: 3.7 mmol/L (ref 3.5–5.1)
Sodium: 139 mmol/L (ref 135–145)
Total Bilirubin: 0.5 mg/dL (ref 0.3–1.2)
Total Protein: 6.8 g/dL (ref 6.5–8.1)

## 2014-11-09 LAB — CBC WITH DIFFERENTIAL/PLATELET
Basophils Absolute: 0.1 10*3/uL (ref 0.0–0.1)
Basophils Relative: 1 % (ref 0–1)
Eosinophils Absolute: 0.7 10*3/uL (ref 0.0–0.7)
Eosinophils Relative: 8 % — ABNORMAL HIGH (ref 0–5)
HCT: 39.6 % (ref 36.0–46.0)
Hemoglobin: 13.1 g/dL (ref 12.0–15.0)
Lymphocytes Relative: 33 % (ref 12–46)
Lymphs Abs: 2.9 10*3/uL (ref 0.7–4.0)
MCH: 31.3 pg (ref 26.0–34.0)
MCHC: 33.1 g/dL (ref 30.0–36.0)
MCV: 94.7 fL (ref 78.0–100.0)
Monocytes Absolute: 0.8 10*3/uL (ref 0.1–1.0)
Monocytes Relative: 10 % (ref 3–12)
Neutro Abs: 4.2 10*3/uL (ref 1.7–7.7)
Neutrophils Relative %: 48 % (ref 43–77)
Platelets: 225 10*3/uL (ref 150–400)
RBC: 4.18 MIL/uL (ref 3.87–5.11)
RDW: 14.2 % (ref 11.5–15.5)
WBC: 8.6 10*3/uL (ref 4.0–10.5)

## 2014-11-09 MED ORDER — SODIUM CHLORIDE 0.9 % IV SOLN
400.0000 mg | INTRAVENOUS | Status: DC
  Administered 2014-11-09: 400 mg via INTRAVENOUS
  Filled 2014-11-09: qty 40

## 2014-11-09 MED ORDER — DIPHENHYDRAMINE HCL 25 MG PO CAPS: 50.0000 mg | ORAL_CAPSULE | Freq: Once | ORAL | Status: DC

## 2014-11-09 MED ORDER — SODIUM CHLORIDE 0.9 % IV SOLN
Freq: Once | INTRAVENOUS | Status: AC
  Administered 2014-11-09: 09:00:00 via INTRAVENOUS

## 2014-11-09 MED ORDER — ACETAMINOPHEN 325 MG PO TABS: 650.0000 mg | ORAL_TABLET | Freq: Once | ORAL | Status: DC

## 2014-11-09 NOTE — Progress Notes (Signed)
Results for TYLENE, QUASHIE (MRN [id number]) as of 11/09/2014 15:33  Ref. Range 11/09/2014 08:40  Sodium Latest Ref Range: 135-145 mmol/L 139  Potassium Latest Ref Range: 3.5-5.1 mmol/L 3.7  Chloride Latest Ref Range: 101-111 mmol/L 108  CO2 Latest Ref Range: 22-32 mmol/L 22  BUN Latest Ref Range: 6-20 mg/dL 19  Creatinine Latest Ref Range: 0.44-1.00 mg/dL 0.91  Calcium Latest Ref Range: 8.9-10.3 mg/dL 9.0  EGFR (Non-African Amer.) Latest Ref Range: >60 mL/min >60  EGFR (African American) Latest Ref Range: >60 mL/min >60  Glucose Latest Ref Range: 65-99 mg/dL 176 (H)  Anion gap Latest Ref Range: 5-15  9  Alkaline Phosphatase Latest Ref Range: 38-126 U/L 46  Albumin Latest Ref Range: 3.5-5.0 g/dL 3.9  AST Latest Ref Range: 15-41 U/L 35  ALT Latest Ref Range: 14-54 U/L 39  Total Protein Latest Ref Range: 6.5-8.1 g/dL 6.8  Total Bilirubin Latest Ref Range: 0.3-1.2 mg/dL 0.5  WBC Latest Ref Range: 4.0-10.5 K/uL 8.6  RBC Latest Ref Range: 3.87-5.11 MIL/uL 4.18  Hemoglobin Latest Ref Range: 12.0-15.0 g/dL 13.1  HCT Latest Ref Range: 36.0-46.0 % 39.6  MCV Latest Ref Range: 78.0-100.0 fL 94.7  MCH Latest Ref Range: 26.0-34.0 pg 31.3  MCHC Latest Ref Range: 30.0-36.0 g/dL 33.1  RDW Latest Ref Range: 11.5-15.5 % 14.2  Platelets Latest Ref Range: 150-400 K/uL 225  Neutrophils Latest Ref Range: 43-77 % 48  Lymphocytes Latest Ref Range: 12-46 % 33  Monocytes Relative Latest Ref Range: 3-12 % 10  Eosinophil Latest Ref Range: 0-5 % 8 (H)  Basophil Latest Ref Range: 0-1 % 1  NEUT# Latest Ref Range: 1.7-7.7 K/uL 4.2  Lymphocyte # Latest Ref Range: 0.7-4.0 K/uL 2.9  Monocyte # Latest Ref Range: 0.1-1.0 K/uL 0.8  Eosinophils Absolute Latest Ref Range: 0.0-0.7 K/uL 0.7  Basophils Absolute Latest Ref Range: 0.0-0.1 K/uL 0.1  Remicade infusion given without incident.

## 2014-12-16 ENCOUNTER — Other Ambulatory Visit: Payer: Self-pay | Admitting: *Deleted

## 2014-12-16 NOTE — Patient Outreach (Signed)
Maurita called this RNCM on 9/14 at the direction of [place] regarding a request for a benefit exception to cover the cost of her routine urine drug testing. This RNCM spoke to [place] by phone today. She says until January of this year, her urine drug tests have been covered under her heath insurance plan. She says the urine test is necessary because she is on hydrocodone 10 mg twice daily to treat psoriatic arthritis pain. Advised Verenis an updated signed HIPPA form will be necessary so that information can be obtained from the ordering physician. At [facility] request the consent was securely emailed to her. Once it is returned, this RNCM will proceed with obtaining the information needed to complete the benefit exception  request.

## 2014-12-21 ENCOUNTER — Encounter (HOSPITAL_COMMUNITY)
Admission: RE | Admit: 2014-12-21 | Discharge: 2014-12-21 | Disposition: A | Discharge status: Home / Self Care | Payer: 59 | Source: Ambulatory Visit | Attending: Rheumatology | Admitting: Rheumatology

## 2014-12-21 DIAGNOSIS — L4050 Arthropathic psoriasis, unspecified: Secondary | ICD-10-CM | POA: Diagnosis present

## 2014-12-21 MED ORDER — SODIUM CHLORIDE 0.9 % IV SOLN
400.0000 mg | Freq: Once | INTRAVENOUS | Status: AC
  Administered 2014-12-21: 400 mg via INTRAVENOUS
  Filled 2014-12-21: qty 40

## 2014-12-21 MED ORDER — SODIUM CHLORIDE 0.9 % IV SOLN
Freq: Once | INTRAVENOUS | Status: AC
  Administered 2014-12-21: 250 mL via INTRAVENOUS

## 2014-12-27 ENCOUNTER — Encounter: Payer: Self-pay | Admitting: *Deleted

## 2014-12-28 ENCOUNTER — Other Ambulatory Visit: Payer: Self-pay | Admitting: *Deleted

## 2014-12-28 ENCOUNTER — Encounter: Payer: Self-pay | Admitting: *Deleted

## 2014-12-28 NOTE — Patient Outreach (Signed)
Completed write up for Glynis's request for a benefit exception related to coverage for routine urine drug tests. Submitted request to [employer] of [place] via secure email. Barrington Ellison RN,CCM,CDE [place] Management Coordinator [facility] Office Phone [phone] Office Fax [phone]

## 2014-12-28 NOTE — Patient Outreach (Signed)
Secure e-mail sent to [facility] home e-mail address ( per her preference) advising her that the benefit exception request related to coverage for routine urine drug tests has been submitted to [place] [employer] of [place] RN,CCM,CDE [place] Management Coordinator [facility] To Well[facility] Office Phone [phone] Office Fax [phone]

## 2014-12-29 NOTE — Patient Outreach (Signed)
[place] via secure home e-mail that benefit exception request was completed and sent to Bary Castilla, Surveyor, quantity of [place]. Also advised [place] that when a decision has been made about the exception , [place] will inform [place]. Barrington Ellison RN,CCM,CDE [place] Management Coordinator [facility] To [facility] Office Phone [phone] Office Fax [phone]

## 2015-02-01 ENCOUNTER — Encounter (HOSPITAL_COMMUNITY): Payer: 59

## 2015-02-14 DIAGNOSIS — E119 Type 2 diabetes mellitus without complications: Secondary | ICD-10-CM | POA: Diagnosis not present

## 2015-02-14 DIAGNOSIS — E559 Vitamin D deficiency, unspecified: Secondary | ICD-10-CM | POA: Diagnosis not present

## 2015-02-15 ENCOUNTER — Encounter (HOSPITAL_COMMUNITY): Payer: Self-pay

## 2015-02-15 ENCOUNTER — Encounter (HOSPITAL_COMMUNITY)
Admission: RE | Admit: 2015-02-15 | Discharge: 2015-02-15 | Disposition: A | Discharge status: Home / Self Care | Payer: Medicare Other | Source: Ambulatory Visit | Attending: Rheumatology | Admitting: Rheumatology

## 2015-02-15 DIAGNOSIS — L4050 Arthropathic psoriasis, unspecified: Secondary | ICD-10-CM | POA: Diagnosis not present

## 2015-02-15 LAB — COMPREHENSIVE METABOLIC PANEL
ALT: 49 U/L (ref 14–54)
AST: 36 U/L (ref 15–41)
Albumin: 3.9 g/dL (ref 3.5–5.0)
Alkaline Phosphatase: 69 U/L (ref 38–126)
Anion gap: 10 (ref 5–15)
BUN: 21 mg/dL — ABNORMAL HIGH (ref 6–20)
CO2: 22 mmol/L (ref 22–32)
Calcium: 9 mg/dL (ref 8.9–10.3)
Chloride: 104 mmol/L (ref 101–111)
Creatinine, Ser: 0.86 mg/dL (ref 0.44–1.00)
GFR calc Af Amer: >60 mL/min (ref >60)
GFR calc non Af Amer: >60 mL/min (ref >60)
Glucose, Bld: 161 mg/dL — ABNORMAL HIGH (ref 65–99)
Potassium: 4.1 mmol/L (ref 3.5–5.1)
Sodium: 136 mmol/L (ref 135–145)
Total Bilirubin: 0.5 mg/dL (ref 0.3–1.2)
Total Protein: 7 g/dL (ref 6.5–8.1)

## 2015-02-15 LAB — CBC WITH DIFFERENTIAL/PLATELET
Basophils Absolute: 0.1 10*3/uL (ref 0.0–0.1)
Basophils Relative: 1 %
Eosinophils Absolute: 0.3 10*3/uL (ref 0.0–0.7)
Eosinophils Relative: 2 %
HCT: 38.5 % (ref 36.0–46.0)
Hemoglobin: 12.5 g/dL (ref 12.0–15.0)
Lymphocytes Relative: 13 %
Lymphs Abs: 2 10*3/uL (ref 0.7–4.0)
MCH: 32 pg (ref 26.0–34.0)
MCHC: 32.5 g/dL (ref 30.0–36.0)
MCV: 98.5 fL (ref 78.0–100.0)
Monocytes Absolute: 1.3 10*3/uL — ABNORMAL HIGH (ref 0.1–1.0)
Monocytes Relative: 8 %
Neutro Abs: 11.6 10*3/uL — ABNORMAL HIGH (ref 1.7–7.7)
Neutrophils Relative %: 76 %
Platelets: 253 10*3/uL (ref 150–400)
RBC: 3.91 MIL/uL (ref 3.87–5.11)
RDW: 14.9 % (ref 11.5–15.5)
WBC: 15.2 10*3/uL — ABNORMAL HIGH (ref 4.0–10.5)

## 2015-02-15 MED ORDER — DIPHENHYDRAMINE HCL 25 MG PO CAPS: 50.0000 mg | ORAL_CAPSULE | Freq: Once | ORAL | Status: DC

## 2015-02-15 MED ORDER — SODIUM CHLORIDE 0.9 % IV SOLN
INTRAVENOUS | Status: DC
  Administered 2015-02-15: 10:00:00 via INTRAVENOUS

## 2015-02-15 MED ORDER — ACETAMINOPHEN 325 MG PO TABS: 650.0000 mg | ORAL_TABLET | Freq: Once | ORAL | Status: DC

## 2015-02-15 MED ORDER — SODIUM CHLORIDE 0.9 % IV SOLN
400.0000 mg | INTRAVENOUS | Status: DC
  Administered 2015-02-15: 400 mg via INTRAVENOUS
  Filled 2015-02-15: qty 40

## 2015-02-15 MED ORDER — DIPHENHYDRAMINE HCL 25 MG PO TABS
50.0000 mg | ORAL_TABLET | Freq: Once | ORAL | Status: DC
  Filled 2015-02-15: qty 2

## 2015-02-16 NOTE — Progress Notes (Signed)
Results for CALENA, SALEM (MRN [id number]) as of 02/16/2015 10:46  Ref. Range 02/15/2015 13:00  Sodium Latest Ref Range: 135-145 mmol/L 136  Potassium Latest Ref Range: 3.5-5.1 mmol/L 4.1  Chloride Latest Ref Range: 101-111 mmol/L 104  CO2 Latest Ref Range: 22-32 mmol/L 22  BUN Latest Ref Range: 6-20 mg/dL 21 (H)  Creatinine Latest Ref Range: 0.44-1.00 mg/dL 0.86  Calcium Latest Ref Range: 8.9-10.3 mg/dL 9.0  EGFR (Non-African Amer.) Latest Ref Range: >60 mL/min >60  EGFR (African American) Latest Ref Range: >60 mL/min >60  Glucose Latest Ref Range: 65-99 mg/dL 161 (H)  Anion gap Latest Ref Range: 5-15  10  Alkaline Phosphatase Latest Ref Range: 38-126 U/L 69  Albumin Latest Ref Range: 3.5-5.0 g/dL 3.9  AST Latest Ref Range: 15-41 U/L 36  ALT Latest Ref Range: 14-54 U/L 49  Total Protein Latest Ref Range: 6.5-8.1 g/dL 7.0  Total Bilirubin Latest Ref Range: 0.3-1.2 mg/dL 0.5  WBC Latest Ref Range: 4.0-10.5 K/uL 15.2 (H)  RBC Latest Ref Range: 3.87-5.11 MIL/uL 3.91  Hemoglobin Latest Ref Range: 12.0-15.0 g/dL 12.5  HCT Latest Ref Range: 36.0-46.0 % 38.5  MCV Latest Ref Range: 78.0-100.0 fL 98.5  MCH Latest Ref Range: 26.0-34.0 pg 32.0  MCHC Latest Ref Range: 30.0-36.0 g/dL 32.5  RDW Latest Ref Range: 11.5-15.5 % 14.9  Platelets Latest Ref Range: 150-400 K/uL 253  Neutrophils Latest Units: % 76  Lymphocytes Latest Units: % 13  Monocytes Relative Latest Units: % 8  Eosinophil Latest Units: % 2  Basophil Latest Units: % 1  NEUT# Latest Ref Range: 1.7-7.7 K/uL 11.6 (H)  Lymphocyte # Latest Ref Range: 0.7-4.0 K/uL 2.0  Monocyte # Latest Ref Range: 0.1-1.0 K/uL 1.3 (H)  Eosinophils Absolute Latest Ref Range: 0.0-0.7 K/uL 0.3  Basophils Absolute Latest Ref Range: 0.0-0.1 K/uL 0.1

## 2015-02-18 DIAGNOSIS — Z5181 Encounter for therapeutic drug level monitoring: Secondary | ICD-10-CM | POA: Diagnosis not present

## 2015-02-21 DIAGNOSIS — N3000 Acute cystitis without hematuria: Secondary | ICD-10-CM | POA: Diagnosis not present

## 2015-03-04 MED ORDER — ZOLEDRONIC ACID 5 MG/100ML IV SOLN: 5.0000 mg | Freq: Once | INTRAVENOUS | Status: DC

## 2015-03-07 ENCOUNTER — Encounter (HOSPITAL_COMMUNITY): Payer: 59

## 2015-03-07 ENCOUNTER — Encounter (HOSPITAL_COMMUNITY)
Admission: RE | Admit: 2015-03-07 | Discharge: 2015-03-07 | Disposition: A | Discharge status: Home / Self Care | Payer: Medicare Other | Source: Ambulatory Visit | Attending: Rheumatology | Admitting: Rheumatology

## 2015-03-07 DIAGNOSIS — M810 Age-related osteoporosis without current pathological fracture: Secondary | ICD-10-CM | POA: Insufficient documentation

## 2015-03-07 MED ORDER — ZOLEDRONIC ACID 5 MG/100ML IV SOLN
INTRAVENOUS | Status: AC
  Filled 2015-03-07: qty 100

## 2015-03-07 MED ORDER — ZOLEDRONIC ACID 5 MG/100ML IV SOLN
5.0000 mg | Freq: Once | INTRAVENOUS | Status: AC
  Administered 2015-03-07: 5 mg via INTRAVENOUS

## 2015-03-08 ENCOUNTER — Encounter: Payer: Self-pay | Admitting: Certified Nurse Midwife

## 2015-03-08 ENCOUNTER — Ambulatory Visit (INDEPENDENT_AMBULATORY_CARE_PROVIDER_SITE_OTHER): Payer: Medicare Other | Admitting: Certified Nurse Midwife

## 2015-03-08 VITALS — BP 116/74 | HR 68 | Temp 97.5°F | Resp 16 | Ht 58.75 in | Wt 156.0 lb

## 2015-03-08 DIAGNOSIS — N390 Urinary tract infection, site not specified: Secondary | ICD-10-CM

## 2015-03-08 DIAGNOSIS — N952 Postmenopausal atrophic vaginitis: Secondary | ICD-10-CM | POA: Diagnosis not present

## 2015-03-08 DIAGNOSIS — N9489 Other specified conditions associated with female genital organs and menstrual cycle: Secondary | ICD-10-CM | POA: Diagnosis not present

## 2015-03-08 LAB — POCT URINALYSIS DIPSTICK
Bilirubin, UA: NEGATIVE
Blood, UA: NEGATIVE
Glucose, UA: NEGATIVE
Ketones, UA: NEGATIVE
Nitrite, UA: NEGATIVE
Protein, UA: NEGATIVE
Urobilinogen, UA: NEGATIVE
pH, UA: 5

## 2015-03-08 NOTE — Progress Notes (Signed)
64 y.o.Married [id number] here with complaint of UTI?Marland Kitchen Patient complaining of urinary frequency/urgency/ and pain with urination when touches skin only. Patient denies fever, chills, nausea or back pain. No new personal products. Patient feels not related to sexual activity. Denies any vaginal symptoms, except ? Dryness, but not sexually active, so not sure. Patient was treated for UTI over the phone with PCP in 10/16, did not resolve and went in had urine culture per patient E.Coli and treated with Macrobid. First medication was Cipro.Patient came in here because she wondered if it was a vaginal problem. Denies increase in vaginal discharge or odor. Wipes "alot with urination and is uncomfortable when wiping".     Menopausal. Patient consuming adequate water intake   O: Healthy female WDWN Affect: Normal, orientation x 3 Skin : warm and dry CVAT: negative bilateral Abdomen: negative for suprapubic tenderness  Pelvic exam: External genital area: atrophic, no lesions or scaling Redness noted inside vulva area and very dry with toliet tissue residue noted. Bladder,Urethra non tender, Urethral meatus: non tender Vagina:scant vaginal discharge, slight atrophic appearance   Affirm taken Cervix: normal, non tender Uterus:normal,non tender Adnexa: normal non tender, no fullness or masses   A: Normal pelvic exam R/O UTI Poct urine- wbc 2+ Vaginal dryness with slight atrophy noted  P: Reviewed findings of normal pelvic exam and no indication of UTI, but will do labs as TOC from treatment.  [id number] micro, culture Reviewed warning signs and symptoms of UTI and need to advise if occurring. Encouraged to limit soda, tea, and coffee and increase water intake. Discussed vaginal dryness and atrophy noted and feel this is the problem. Discussed pure coconut oil use for moisture and barrier with urination. Instructions given for use. Patient feels this is a good option and will advise if no change. Lab:  Affirm will treat if indicated.  Encouraged to consider flu vaccine.  Rv prn

## 2015-03-08 NOTE — Patient Instructions (Signed)

## 2015-03-09 LAB — WET PREP BY MOLECULAR PROBE
Candida species: NEGATIVE
Gardnerella vaginalis: NEGATIVE
Trichomonas vaginosis: NEGATIVE

## 2015-03-09 LAB — URINALYSIS, MICROSCOPIC ONLY
Casts: NONE SEEN [LPF]
Crystals: NONE SEEN [HPF]
RBC / HPF: NONE SEEN RBC/HPF (ref <=2)
Squamous Epithelial / LPF: NONE SEEN [HPF] (ref <=5)
Yeast: NONE SEEN [HPF]

## 2015-03-09 NOTE — Progress Notes (Signed)
Reviewed personally.  M. Suzanne Jakye Mullens, MD.  

## 2015-03-11 ENCOUNTER — Other Ambulatory Visit: Payer: Self-pay | Admitting: Certified Nurse Midwife

## 2015-03-11 ENCOUNTER — Telehealth: Payer: Self-pay

## 2015-03-11 DIAGNOSIS — N390 Urinary tract infection, site not specified: Secondary | ICD-10-CM

## 2015-03-11 LAB — URINE CULTURE: Colony Count: >=100000

## 2015-03-11 MED ORDER — CIPROFLOXACIN HCL 500 MG PO TABS: 500.0000 mg | ORAL_TABLET | Freq: Two times a day (BID) | ORAL | Status: DC

## 2015-03-11 MED ORDER — CIPROFLOXACIN HCL 500 MG PO TABS: ORAL_TABLET | ORAL | Status: DC

## 2015-03-11 NOTE — Telephone Encounter (Signed)
Patient notified of results. See lab 

## 2015-03-11 NOTE — Telephone Encounter (Signed)
-----   Message from Regina Eck, CNM sent at 03/11/2015  7:53 AM EST ----- Notify patient that culture is back and shows E. Coli large amount, so did not clear. Macrobid not as sensitive. Rx Cipro is more sensitive Order in she needs TOC at 2 weeks urine culture please schedule order in Patient needs to treat vaginal dryness as discussed, because this increases risk of recurrent UTI.

## 2015-03-11 NOTE — Telephone Encounter (Signed)
lmtcb

## 2015-03-19 ENCOUNTER — Encounter (HOSPITAL_COMMUNITY): Payer: Self-pay | Admitting: Emergency Medicine

## 2015-03-19 ENCOUNTER — Emergency Department (INDEPENDENT_AMBULATORY_CARE_PROVIDER_SITE_OTHER)
Admission: EM | Admit: 2015-03-19 | Discharge: 2015-03-19 | Disposition: A | Discharge status: Home / Self Care | Payer: Medicare Other | Source: Home / Self Care | Attending: Emergency Medicine | Admitting: Emergency Medicine

## 2015-03-19 DIAGNOSIS — J069 Acute upper respiratory infection, unspecified: Secondary | ICD-10-CM

## 2015-03-19 DIAGNOSIS — J9801 Acute bronchospasm: Secondary | ICD-10-CM | POA: Diagnosis not present

## 2015-03-19 DIAGNOSIS — J4530 Mild persistent asthma, uncomplicated: Secondary | ICD-10-CM | POA: Diagnosis not present

## 2015-03-19 MED ORDER — IPRATROPIUM-ALBUTEROL 0.5-2.5 (3) MG/3ML IN SOLN
3.0000 mL | Freq: Once | RESPIRATORY_TRACT | Status: AC
  Administered 2015-03-19: 3 mL via RESPIRATORY_TRACT

## 2015-03-19 MED ORDER — PREDNISONE 20 MG PO TABS: ORAL_TABLET | ORAL | Status: DC

## 2015-03-19 MED ORDER — IPRATROPIUM-ALBUTEROL 0.5-2.5 (3) MG/3ML IN SOLN
RESPIRATORY_TRACT | Status: AC
  Filled 2015-03-19: qty 3

## 2015-03-19 MED ORDER — IPRATROPIUM BROMIDE 0.06 % NA SOLN: 2.0000 | Freq: Four times a day (QID) | NASAL | Status: DC

## 2015-03-19 NOTE — ED Notes (Signed)
C/o cold sx onset 3-4 weeks Sx include prod cough, wheezing, SOB, congestion, runny nose Denies fevers A&O x4... No acute distress.

## 2015-03-19 NOTE — ED Provider Notes (Signed)
CSN: [id number]     Arrival date & time 03/19/15  1402 History   First MD Initiated Contact with Patient 03/19/15 1442     Chief Complaint  Patient presents with  . URI   (Consider location/radiation/quality/duration/timing/severity/associated sxs/prior Treatment) HPI Comments: 64 year old female with a history of asthma, generally well controlled with albuterol when necessary is complaining of cough with cough spasms, nasal congestion, sniffles, runny nose and PND for 3-4 weeks. Denies fevers. She is using her albuterol HFA. 3 times yesterday and once today. She has a history of psoriatic arthritis and strain all his disease.   Past Medical History  Diagnosis Date  . Lymphocytic colitis 05/26/10 TCS RMR    Responded to Entocort x 3 MOS  . Psoriatic arthritis ([place])     Dr. Katherina Right  . Degenerative disk disease   . Raynaud's disease   . Seasonal allergies   . Osteoporosis     gets Boniva every 3 months  . Hypokalemia   . Esophageal motility disorder     Non-specific, see modified barium study/speech path, BP  . Hyperlipidemia     takes Pravastatin daily  . PONV (postoperative nausea and vomiting)   . HTN (hypertension)     takes Metoprolol daily and SPironolactone daily  . Peripheral edema     takes HCTZ daily  . Shortness of breath     with exertion  . Asthma     Albuterol in haler prn  . History of bronchitis     last time early 2014  . Neuropathy ([place])   . Chronic back pain   . GERD (gastroesophageal reflux disease)     takes Protonix bid  . Inflammatory bowel disease (ulcerative colitis) ([place])   . Urinary urgency   . Anemia     at times  . History of blood transfusion     did have a rash after receiving the blood  . Cataract     immature unsure which eye  . Monilia infection 12/93  . SUI (stress urinary incontinence, female)   . Hypercholesterolemia   . History of blood transfusion     post c-section  . Diabetes mellitus without complication [facility])    Past  Surgical History  Procedure Laterality Date  . Cesarean section  1974, 1978       . Tonsillectomy and adenoidectomy    . Lumbar fusion  6/03, 11/08, 11/14    L4-5 fusion, L2-3 fusion, L1-2  . Dilation and curettage of uterus  1977    spontaneous abortion  . Lumbar disc arthroplasty  7/98   Family History  Problem Relation Age of Onset  . Rheum arthritis Mother   . Diabetes Mother   . Rheumatic fever Father   . Colon cancer Neg Hx   . Colon polyps Neg Hx   . Diabetes      grandparents  . Skin cancer      grandfather  . Hypertension      grandparent  . Congestive Heart Failure      grandfather  . Parkinson's disease      grandmother  . Pancreatitis Mother    Social History  Substance Use Topics  . Smoking status: Never Smoker   . Smokeless tobacco: Never Used  . Alcohol Use: No   OB History    Gravida Para Term Preterm AB TAB SAB Ectopic Multiple Living   3 2        2      Review of Systems  Constitutional: Positive for activity change. Negative for fever, chills, appetite change and fatigue.  HENT: Positive for congestion, postnasal drip, rhinorrhea and voice change. Negative for ear pain, facial swelling and sore throat.   Eyes: Negative.   Respiratory: Positive for cough and shortness of breath.   Cardiovascular: Negative.   Gastrointestinal: Negative.   Musculoskeletal: Negative for neck pain and neck stiffness.  Skin: Negative.  Negative for pallor and rash.  Neurological: Negative.     Allergies  Adalimumab; Imuran; Methotrexate; Plaquenil; Sulfonamide derivatives; Ceftin; Other; and Morphine  Home Medications   Prior to Admission medications   Medication Sig Start Date End Date Taking? Authorizing Provider  albuterol (PROVENTIL HFA;VENTOLIN HFA) 108 (90 BASE) MCG/ACT inhaler Inhale 1 puff into the lungs every 6 (six) hours as needed for wheezing or shortness of breath.   Yes Historical Provider, MD  aspirin 81 MG tablet Take 81 mg by mouth daily.      Yes Historical Provider, MD  calcium carbonate (OS-CAL) 600 MG TABS Take 600 mg by mouth 2 (two) times daily with a meal.    Yes Historical Provider, MD  ciprofloxacin (CIPRO) 500 MG tablet One tablet bid x 5 days 03/11/15  Yes Regina Eck, CNM  cyclobenzaprine (FLEXERIL) 5 MG tablet Take 5-10 mg by mouth 2 (two) times daily. Takes 10 mg in the am, and 5 mg in the pm.   Yes Historical Provider, MD  diclofenac sodium (VOLTAREN) 1 % GEL  01/17/15  Yes Historical Provider, MD  DULoxetine (CYMBALTA) 60 MG capsule Take 1 capsule (60 mg total) by mouth at bedtime. 07/03/11  Yes Zenia Resides, MD  fish oil-omega-3 fatty acids 1000 MG capsule Take 2 g by mouth 2 (two) times daily.    Yes Historical Provider, MD  FOLIC ACID PO Take 1 tablet by mouth daily.   Yes Historical Provider, MD  Ginger, Zingiber officinalis, (GINGER ROOT) 500 MG CAPS Take 1 capsule by mouth daily.    Yes Historical Provider, MD  Glucosamine Sulfate 1000 MG CAPS Take 2 capsules (2,000 mg total) by mouth daily. 07/03/11  Yes Zenia Resides, MD  HYDROcodone-acetaminophen (NORCO) 10-325 MG per tablet Take 0.5 tablets by mouth every 6 (six) hours as needed for moderate pain.   Yes Historical Provider, MD  leflunomide (ARAVA) 20 MG tablet Take 10 mg by mouth. 10mg  6 days/weekly   Yes Historical Provider, MD  metoprolol succinate (TOPROL-XL) 50 MG 24 hr tablet Take 1 tablet (50 mg total) by mouth daily. Take with or immediately following a meal. 07/03/11  Yes Zenia Resides, MD  Multiple Vitamin (MULTIVITAMIN) tablet Take 1 tablet by mouth daily.     Yes Historical Provider, MD  pantoprazole (PROTONIX) 40 MG tablet TAKE 1 TABLET BY MOUTH 2 TIMES DAILY BEFORE A MEAL. 07/30/14  Yes Orvil Feil, NP  potassium chloride SA (K-DUR,KLOR-CON) 20 MEQ tablet Take 20 mEq by mouth 2 (two) times daily.   Yes Historical Provider, MD  pravastatin (PRAVACHOL) 40 MG tablet Take 40 mg by mouth daily.   Yes Historical Provider, MD  spironolactone  (ALDACTONE) 25 MG tablet Take 25 mg by mouth daily.    Yes Historical Provider, MD  topiramate (TOPAMAX) 200 MG tablet Take 1 tablet (200 mg total) by mouth at bedtime. 07/03/11  Yes Zenia Resides, MD  Turmeric 500 MG CAPS Take 1 capsule by mouth daily.    Yes Historical Provider, MD  Zoledronic Acid (RECLAST IV) Inject into the vein.  Yes Historical Provider, MD  InFLIXimab (REMICADE IV) Inject into the vein.    Historical Provider, MD  ipratropium (ATROVENT) 0.06 % nasal spray Place 2 sprays into both nostrils 4 (four) times daily. 03/19/15   Janne Napoleon, NP  Misc Natural Products (TART CHERRY ADVANCED PO) Take by mouth. daily    Historical Provider, MD  predniSONE (DELTASONE) 20 MG tablet Take 3 tabs po on first day, 2 tabs second day, 2 tabs third day, 1 tab fourth day, 1 tab 5th day. Take with food. 03/19/15   Janne Napoleon, NP  PROBIOTIC CAPS Take 1 capsule by mouth daily. 07/03/11   Zenia Resides, MD   Meds Ordered and Administered this Visit   Medications  ipratropium-albuterol (DUONEB) 0.5-2.5 (3) MG/3ML nebulizer solution 3 mL (3 mLs Nebulization Given 03/19/15 1513)    BP 156/79 mmHg  Pulse 99  Temp(Src) 98.7 F (37.1 C) (Oral)  Resp 20  SpO2 99%  LMP 02/01/1999 No data found.   Physical Exam  Constitutional: She is oriented to person, place, and time. She appears well-developed and well-nourished. No distress.  HENT:  Head: Normocephalic.  Right Ear: External ear normal.  Left Ear: External ear normal.  Minor erythema of the posterior oropharynx. Clear PND, no exudates.  Eyes: Conjunctivae and EOM are normal.  Neck: Normal range of motion. Neck supple.  Cardiovascular: Normal rate and regular rhythm.   No murmur heard. Pulmonary/Chest: Effort normal. No respiratory distress. She has wheezes. She has no rales.  Overall good air movement. Expiratory phase mildly prolonged. Distant faint wheeze bilaterally. Coarseness with cough. Taking deep breaths produces coughing  spasms.  Musculoskeletal: Normal range of motion.  Lymphadenopathy:    She has no cervical adenopathy.  Neurological: She is alert and oriented to person, place, and time. No cranial nerve deficit.  Skin: Skin is warm and dry.  Psychiatric: She has a normal mood and affect.  Nursing note and vitals reviewed.   ED Course  Procedures (including critical care time)  Labs Review Labs Reviewed - No data to display  Imaging Review No results found.   Visual Acuity Review  Right Eye Distance:   Left Eye Distance:   Bilateral Distance:    Right Eye Near:   Left Eye Near:    Bilateral Near:         MDM   1. URI (upper respiratory infection)   2. Cough due to bronchospasm   3. Asthma, mild persistent, uncomplicated      Post DuoNeb lungs are clear. No wheezing, no coarseness and no cough. Good air movement and good chest expansion. Patient states she feels better and is breathing better. She will continue using the albuterol HFA every 4 hours when necessary. Will add a short burst of prednisone Continue taking the cetirizine and will add Chlor-Trimeton 2 mg every 4-6 hours as needed for drainage. For worsening, fever, increased shortness of breath or other problems sig medical attention promptly.  Janne Napoleon, NP 03/19/15 1525

## 2015-03-19 NOTE — Discharge Instructions (Signed)
Upper Respiratory Infection, Adult May continue using year cetirizine or Zyrtec. Consider adding 2 mg of Chlor-Trimeton every 4-6 hours as needed for drainage. This may cause some drowsiness. Drink plenty fluids and stay well-hydrated. Atrovent nasal spray as directed for drainage and sniffles. Prednisone taper dose take with food Albuterol HFA 2 puffs every 4 hours as needed for cough, call spasms or problems breathing. Most upper respiratory infections (URIs) are a viral infection of the air passages leading to the lungs. A URI affects the nose, throat, and upper air passages. The most common type of URI is nasopharyngitis and is typically referred to as "the common cold." URIs run their course and usually go away on their own. Most of the time, a URI does not require medical attention, but sometimes a bacterial infection in the upper airways can follow a viral infection. This is called a secondary infection. Sinus and middle ear infections are common types of secondary upper respiratory infections. Bacterial pneumonia can also complicate a URI. A URI can worsen asthma and chronic obstructive pulmonary disease (COPD). Sometimes, these complications can require emergency medical care and may be life threatening.  CAUSES Almost all URIs are caused by viruses. A virus is a type of germ and can spread from one person to another.  RISKS FACTORS You may be at risk for a URI if:  1. You smoke.  2. You have chronic heart or lung disease. 3. You have a weakened defense (immune) system.  4. You are very young or very old.  5. You have nasal allergies or asthma. 6. You work in crowded or poorly ventilated areas. 7. You work in health care facilities or schools. SIGNS AND SYMPTOMS  Symptoms typically develop 2-3 days after you come in contact with a cold virus. Most viral URIs last 7-10 days. However, viral URIs from the influenza virus (flu virus) can last 14-18 days and are typically more severe.  Symptoms may include:   Runny or stuffy (congested) nose.   Sneezing.   Cough.   Sore throat.   Headache.   Fatigue.   Fever.   Loss of appetite.   Pain in your forehead, behind your eyes, and over your cheekbones (sinus pain).  Muscle aches.  DIAGNOSIS  Your health care provider may diagnose a URI by:  Physical exam.  Tests to check that your symptoms are not due to another condition such as:  Strep throat.  Sinusitis.  Pneumonia.  Asthma. TREATMENT  A URI goes away on its own with time. It cannot be cured with medicines, but medicines may be prescribed or recommended to relieve symptoms. Medicines may help:  Reduce your fever.  Reduce your cough.  Relieve nasal congestion. HOME CARE INSTRUCTIONS   Take medicines only as directed by your health care provider.   Gargle warm saltwater or take cough drops to comfort your throat as directed by your health care provider.  Use a warm mist humidifier or inhale steam from a shower to increase air moisture. This may make it easier to breathe.  Drink enough fluid to keep your urine clear or pale yellow.   Eat soups and other clear broths and maintain good nutrition.   Rest as needed.   Return to work when your temperature has returned to normal or as your health care provider advises. You may need to stay home longer to avoid infecting others. You can also use a face mask and careful hand washing to prevent spread of the virus.  Increase  the usage of your inhaler if you have asthma.   Do not use any tobacco products, including cigarettes, chewing tobacco, or electronic cigarettes. If you need help quitting, ask your health care provider. PREVENTION  The best way to protect yourself from getting a cold is to practice good hygiene.   Avoid oral or hand contact with people with cold symptoms.   Wash your hands often if contact occurs.  There is no clear evidence that vitamin C, vitamin E,  echinacea, or exercise reduces the chance of developing a cold. However, it is always recommended to get plenty of rest, exercise, and practice good nutrition.  SEEK MEDICAL CARE IF:   You are getting worse rather than better.   Your symptoms are not controlled by medicine.   You have chills.  You have worsening shortness of breath.  You have brown or red mucus.  You have yellow or brown nasal discharge.  You have pain in your face, especially when you bend forward.  You have a fever.  You have swollen neck glands.  You have pain while swallowing.  You have white areas in the back of your throat. SEEK IMMEDIATE MEDICAL CARE IF:   You have severe or persistent:  Headache.  Ear pain.  Sinus pain.  Chest pain.  You have chronic lung disease and any of the following:  Wheezing.  Prolonged cough.  Coughing up blood.  A change in your usual mucus.  You have a stiff neck.  You have changes in your:  Vision.  Hearing.  Thinking.  Mood. MAKE SURE YOU:   Understand these instructions.  Will watch your condition.  Will get help right away if you are not doing well or get worse.   This information is not intended to replace advice given to you by your health care provider. Make sure you discuss any questions you have with your health care provider.   Document Released: 09/12/2000 Document Revised: 08/03/2014 Document Reviewed: 06/24/2013 Elsevier Interactive Patient Education 2016 [employer].  Asthma, Acute Bronchospasm Acute bronchospasm caused by asthma is also referred to as an asthma attack. Bronchospasm means your air passages become narrowed. The narrowing is caused by inflammation and tightening of the muscles in the air tubes (bronchi) in your lungs. This can make it hard to breathe or cause you to wheeze and cough. CAUSES Possible triggers are: 8. Animal dander from the skin, hair, or feathers of animals. 9. Dust mites contained in house  dust. 10. Cockroaches. 11. Pollen from trees or grass. 12. Mold. 13. Cigarette or tobacco smoke. 14. Air pollutants such as dust, household cleaners, hair sprays, aerosol sprays, paint fumes, strong chemicals, or strong odors. 15. Cold air or weather changes. Cold air may trigger inflammation. Winds increase molds and pollens in the air. 16. Strong emotions such as crying or laughing hard. 17. Stress. 18. Certain medicines such as aspirin or beta-blockers. 19. Sulfites in foods and drinks, such as dried fruits and wine. 20. Infections or inflammatory conditions, such as a flu, cold, or inflammation of the nasal membranes (rhinitis). 21. Gastroesophageal reflux disease (GERD). GERD is a condition where stomach acid backs up into your esophagus. 22. Exercise or strenuous activity. SIGNS AND SYMPTOMS   Wheezing.  Excessive coughing, particularly at night.  Chest tightness.  Shortness of breath. DIAGNOSIS  Your health care provider will ask you about your medical history and perform a physical exam. A chest X-ray or blood testing may be performed to look for other causes  of your symptoms or other conditions that may have triggered your asthma attack. TREATMENT  Treatment is aimed at reducing inflammation and opening up the airways in your lungs. Most asthma attacks are treated with inhaled medicines. These include quick relief or rescue medicines (such as bronchodilators) and controller medicines (such as inhaled corticosteroids). These medicines are sometimes given through an inhaler or a nebulizer. Systemic steroid medicine taken by mouth or given through an IV tube also can be used to reduce the inflammation when an attack is moderate or severe. Antibiotic medicines are only used if a bacterial infection is present.  HOME CARE INSTRUCTIONS   Rest.  Drink plenty of liquids. This helps the mucus to remain thin and be easily coughed up. Only use caffeine in moderation and do not use  alcohol until you have recovered from your illness.  Do not smoke. Avoid being exposed to secondhand smoke.  You play a critical role in keeping yourself in good health. Avoid exposure to things that cause you to wheeze or to have breathing problems.  Keep your medicines up-to-date and available. Carefully follow your health care provider's treatment plan.  Take your medicine exactly as prescribed.  When pollen or pollution is bad, keep windows closed and use an air conditioner or go to places with air conditioning.  Asthma requires careful medical care. See your health care provider for a follow-up as advised. If you are more than [redacted] weeks pregnant and you were prescribed any new medicines, let your obstetrician know about the visit and how you are doing. Follow up with your health care provider as directed.  After you have recovered from your asthma attack, make an appointment with your outpatient doctor to talk about ways to reduce the likelihood of future attacks. If you do not have a doctor who manages your asthma, make an appointment with a primary care doctor to discuss your asthma. SEEK IMMEDIATE MEDICAL CARE IF:   You are getting worse.  You have trouble breathing. If severe, call your local emergency services (911 in the [place]  You develop chest pain or discomfort.  You are vomiting.  You are not able to keep fluids down.  You are coughing up yellow, green, brown, or bloody sputum.  You have a fever and your symptoms suddenly get worse.  You have trouble swallowing. MAKE SURE YOU:   Understand these instructions.  Will watch your condition.  Will get help right away if you are not doing well or get worse.   This information is not intended to replace advice given to you by your health care provider. Make sure you discuss any questions you have with your health care provider.   Document Released: 07/04/2006 Document Revised: 03/24/2013 Document Reviewed:  09/24/2012 Elsevier Interactive Patient Education 2016 [employer].  How to Use an Inhaler Using your inhaler correctly is very important. Good technique will make sure that the medicine reaches your lungs.  HOW TO USE AN INHALER: 23. Take the cap off the inhaler. 24. If this is the first time using your inhaler, you need to prime it. Shake the inhaler for 5 seconds. Release four puffs into the air, away from your face. Ask your doctor for help if you have questions. 25. Shake the inhaler for 5 seconds. 26. Turn the inhaler so the bottle is above the mouthpiece. 27. Put your pointer finger on top of the bottle. Your thumb holds the bottom of the inhaler. 28. Open your mouth. 29. Either hold the  inhaler away from your mouth (the width of 2 fingers) or place your lips tightly around the mouthpiece. Ask your doctor which way to use your inhaler. 30. Breathe out as much air as possible. 31. Breathe in and push down on the bottle 1 time to release the medicine. You will feel the medicine go in your mouth and throat. 32. Continue to take a deep breath in very slowly. Try to fill your lungs. 33. After you have breathed in completely, hold your breath for 10 seconds. This will help the medicine to settle in your lungs. If you cannot hold your breath for 10 seconds, hold it for as long as you can before you breathe out. 34. Breathe out slowly, through pursed lips. Whistling is an example of pursed lips. 35. If your doctor has told you to take more than 1 puff, wait at least 15-30 seconds between puffs. This will help you get the best results from your medicine. Do not use the inhaler more than your doctor tells you to. 36. Put the cap back on the inhaler. 37. Follow the directions from your doctor or from the inhaler package about cleaning the inhaler. If you use more than one inhaler, ask your doctor which inhalers to use and what order to use them in. Ask your doctor to help you figure out when you  will need to refill your inhaler.  If you use a steroid inhaler, always rinse your mouth with water after your last puff, gargle and spit out the water. Do not swallow the water. GET HELP IF:  The inhaler medicine only partially helps to stop wheezing or shortness of breath.  You are having trouble using your inhaler.  You have some increase in thick spit (phlegm). GET HELP RIGHT AWAY IF:  The inhaler medicine does not help your wheezing or shortness of breath or you have tightness in your chest.  You have dizziness, headaches, or fast heart rate.  You have chills, fever, or night sweats.  You have a large increase of thick spit, or your thick spit is bloody. MAKE SURE YOU:   Understand these instructions.  Will watch your condition.  Will get help right away if you are not doing well or get worse.   This information is not intended to replace advice given to you by your health care provider. Make sure you discuss any questions you have with your health care provider.   Document Released: 12/27/2007 Document Revised: 01/07/2013 Document Reviewed: 10/16/2012 Elsevier Interactive Patient Education [employer].

## 2015-03-22 ENCOUNTER — Ambulatory Visit (INDEPENDENT_AMBULATORY_CARE_PROVIDER_SITE_OTHER): Payer: Medicare Other | Admitting: *Deleted

## 2015-03-22 DIAGNOSIS — Z23 Encounter for immunization: Secondary | ICD-10-CM | POA: Diagnosis not present

## 2015-03-22 DIAGNOSIS — H6692 Otitis media, unspecified, left ear: Secondary | ICD-10-CM | POA: Diagnosis not present

## 2015-03-22 DIAGNOSIS — N390 Urinary tract infection, site not specified: Secondary | ICD-10-CM | POA: Diagnosis not present

## 2015-03-22 NOTE — Progress Notes (Signed)
Patient ID: Tonya Hall, female   DOB: 05/12/1950, 64 y.o.   MRN: [id number] Patient here for a TOC. She is feeling much better. A urine was collected and sent for a urine culture.

## 2015-03-24 LAB — URINE CULTURE
Colony Count: NO GROWTH
Organism ID, Bacteria: NO GROWTH

## 2015-03-29 ENCOUNTER — Other Ambulatory Visit (HOSPITAL_COMMUNITY): Payer: Medicare Other

## 2015-03-29 ENCOUNTER — Encounter (HOSPITAL_COMMUNITY)
Admission: RE | Admit: 2015-03-29 | Discharge: 2015-03-29 | Disposition: A | Discharge status: Home / Self Care | Payer: Medicare Other | Source: Ambulatory Visit | Attending: Rheumatology | Admitting: Rheumatology

## 2015-03-29 NOTE — Progress Notes (Addendum)
Patient called today, stated that she is being treated for a sinus infection and is on antibotics, wants to reschedule. Discussed with Dr.  Is OK with patient  being done after Thursday of the next week  after her completion of her treatment.

## 2015-03-31 ENCOUNTER — Inpatient Hospital Stay (HOSPITAL_COMMUNITY)
Admission: RE | Admit: 2015-03-31 | Discharge: 2015-03-31 | Disposition: A | Discharge status: Home / Self Care | Payer: Medicare Other | Source: Ambulatory Visit | Attending: Rheumatology | Admitting: Rheumatology

## 2015-04-11 ENCOUNTER — Encounter (HOSPITAL_COMMUNITY)
Admission: RE | Admit: 2015-04-11 | Discharge: 2015-04-11 | Disposition: A | Discharge status: Home / Self Care | Payer: Medicare Other | Source: Ambulatory Visit | Attending: Rheumatology | Admitting: Rheumatology

## 2015-04-15 DIAGNOSIS — J342 Deviated nasal septum: Secondary | ICD-10-CM | POA: Diagnosis not present

## 2015-04-15 DIAGNOSIS — H9042 Sensorineural hearing loss, unilateral, left ear, with unrestricted hearing on the contralateral side: Secondary | ICD-10-CM | POA: Diagnosis not present

## 2015-04-15 DIAGNOSIS — H6983 Other specified disorders of Eustachian tube, bilateral: Secondary | ICD-10-CM | POA: Diagnosis not present

## 2015-04-18 DIAGNOSIS — M79641 Pain in right hand: Secondary | ICD-10-CM | POA: Diagnosis not present

## 2015-04-18 DIAGNOSIS — Z09 Encounter for follow-up examination after completed treatment for conditions other than malignant neoplasm: Secondary | ICD-10-CM | POA: Diagnosis not present

## 2015-04-18 DIAGNOSIS — M7071 Other bursitis of hip, right hip: Secondary | ICD-10-CM | POA: Diagnosis not present

## 2015-04-18 DIAGNOSIS — L4050 Arthropathic psoriasis, unspecified: Secondary | ICD-10-CM | POA: Diagnosis not present

## 2015-04-27 ENCOUNTER — Ambulatory Visit: Payer: Medicare Other | Admitting: Obstetrics and Gynecology

## 2015-04-27 ENCOUNTER — Ambulatory Visit (INDEPENDENT_AMBULATORY_CARE_PROVIDER_SITE_OTHER): Payer: Medicare Other | Admitting: Obstetrics and Gynecology

## 2015-04-27 ENCOUNTER — Encounter: Payer: Self-pay | Admitting: Obstetrics and Gynecology

## 2015-04-27 VITALS — BP 120/60 | HR 88 | Resp 16 | Wt 155.0 lb

## 2015-04-27 DIAGNOSIS — L0231 Cutaneous abscess of buttock: Secondary | ICD-10-CM | POA: Diagnosis not present

## 2015-04-27 MED ORDER — DOXYCYCLINE HYCLATE 100 MG PO CAPS: ORAL_CAPSULE | ORAL | Status: DC

## 2015-04-27 NOTE — Progress Notes (Signed)
Patient ID: Tonya Hall, female   DOB: 08/18/1950, 65 y.o.   MRN: [id number] GYNECOLOGY  VISIT   HPI: 65 y.o.   Married  Caucasian  female   G3P2 with Patient's last menstrual period was 02/01/1999.   here c/o painful knot in her groin area. She first noted it on Monday in her left groin, seems like an infected pimple. It is mildly tender. It did drain a little bit yesterday. She is having pain in her left groin, separate from the groin area, feels like her pelvic joint.   GYNECOLOGIC HISTORY: Patient's last menstrual period was 02/01/1999. Contraception:post menopause Menopausal hormone therapy: None        OB History    Gravida Para Term Preterm AB TAB SAB Ectopic Multiple Living   3 2        2          Patient Active Problem List   Diagnosis Date Noted  . Abnormal weight gain 06/25/2012  . Hypertension 07/03/2011  . Hypercholesteremia 07/03/2011  . Psoriatic arthritis ([place]) 07/03/2011  . Osteoarthritis of multiple joints 07/03/2011  . Esophageal motility disorder 02/14/2011  . Cough 02/14/2011  . Dysphagia 09/21/2010  . GERD 05/05/2010    Past Medical History  Diagnosis Date  . Lymphocytic colitis 05/26/10 TCS RMR    Responded to Entocort x 3 MOS  . Psoriatic arthritis ([place])     Dr. Katherina Right  . Degenerative disk disease   . Raynaud's disease   . Seasonal allergies   . Osteoporosis     gets Boniva every 3 months  . Hypokalemia   . Esophageal motility disorder     Non-specific, see modified barium study/speech path, BP  . Hyperlipidemia     takes Pravastatin daily  . PONV (postoperative nausea and vomiting)   . HTN (hypertension)     takes Metoprolol daily and SPironolactone daily  . Peripheral edema     takes HCTZ daily  . Shortness of breath     with exertion  . Asthma     Albuterol in haler prn  . History of bronchitis     last time early 2014  . Neuropathy ([place])   . Chronic back pain   . GERD (gastroesophageal reflux disease)     takes Protonix bid   . Inflammatory bowel disease (ulcerative colitis) ([place])   . Urinary urgency   . Anemia     at times  . History of blood transfusion     did have a rash after receiving the blood  . Cataract     immature unsure which eye  . Monilia infection 12/93  . SUI (stress urinary incontinence, female)   . Hypercholesterolemia   . History of blood transfusion     post c-section  . Diabetes mellitus without complication [facility])     Past Surgical History  Procedure Laterality Date  . Cesarean section  1974, 1978       . Tonsillectomy and adenoidectomy    . Lumbar fusion  6/03, 11/08, 11/14    L4-5 fusion, L2-3 fusion, L1-2  . Dilation and curettage of uterus  1977    spontaneous abortion  . Lumbar disc arthroplasty  7/98    Current Outpatient Prescriptions  Medication Sig Dispense Refill  . albuterol (PROVENTIL HFA;VENTOLIN HFA) 108 (90 BASE) MCG/ACT inhaler Inhale 1 puff into the lungs every 6 (six) hours as needed for wheezing or shortness of breath.    Marland Kitchen aspirin 81 MG tablet Take 81 mg  by mouth daily.      . calcium carbonate (OS-CAL) 600 MG TABS Take 600 mg by mouth 2 (two) times daily with a meal.     . ciprofloxacin (CIPRO) 500 MG tablet One tablet bid x 5 days 10 tablet 0  . cyclobenzaprine (FLEXERIL) 5 MG tablet Take 5-10 mg by mouth 2 (two) times daily. Takes 10 mg in the am, and 5 mg in the pm.    . diclofenac sodium (VOLTAREN) 1 % GEL   2  . DULoxetine (CYMBALTA) 60 MG capsule Take 1 capsule (60 mg total) by mouth at bedtime.    . fish oil-omega-3 fatty acids 1000 MG capsule Take 2 g by mouth 2 (two) times daily.     Marland Kitchen FOLIC ACID PO Take 1 tablet by mouth daily.    . Ginger, Zingiber officinalis, (GINGER ROOT) 500 MG CAPS Take 1 capsule by mouth daily.     . Glucosamine Sulfate 1000 MG CAPS Take 2 capsules (2,000 mg total) by mouth daily.    Marland Kitchen HYDROcodone-acetaminophen (NORCO) 10-325 MG per tablet Take 0.5 tablets by mouth every 6 (six) hours as needed for moderate pain.    .  InFLIXimab (REMICADE IV) Inject into the vein.    Marland Kitchen ipratropium (ATROVENT) 0.06 % nasal spray Place 2 sprays into both nostrils 4 (four) times daily. 15 mL 0  . leflunomide (ARAVA) 20 MG tablet Take 10 mg by mouth. 10mg  6 days/weekly    . metoprolol succinate (TOPROL-XL) 50 MG 24 hr tablet Take 1 tablet (50 mg total) by mouth daily. Take with or immediately following a meal.    . Misc Natural Products (TART CHERRY ADVANCED PO) Take by mouth. daily    . Multiple Vitamin (MULTIVITAMIN) tablet Take 1 tablet by mouth daily.      . pantoprazole (PROTONIX) 40 MG tablet TAKE 1 TABLET BY MOUTH 2 TIMES DAILY BEFORE A MEAL. 180 tablet 3  . potassium chloride SA (K-DUR,KLOR-CON) 20 MEQ tablet Take 20 mEq by mouth 2 (two) times daily.    . pravastatin (PRAVACHOL) 40 MG tablet Take 40 mg by mouth daily.    . predniSONE (DELTASONE) 5 MG tablet Take 5 mg by mouth daily with breakfast.    . PROBIOTIC CAPS Take 1 capsule by mouth daily.    Marland Kitchen spironolactone (ALDACTONE) 25 MG tablet Take 25 mg by mouth daily.     Marland Kitchen topiramate (TOPAMAX) 200 MG tablet Take 1 tablet (200 mg total) by mouth at bedtime.    . Turmeric 500 MG CAPS Take 1 capsule by mouth daily.     . Zoledronic Acid (RECLAST IV) Inject into the vein.    . [DISCONTINUED] Glucosamine 500 MG TABS Take 4 tablets by mouth daily.      . [DISCONTINUED] simvastatin (ZOCOR) 40 MG tablet Take 20 mg by mouth at bedtime.      No current facility-administered medications for this visit.     ALLERGIES: Adalimumab; Imuran; Methotrexate; Plaquenil; Sulfonamide derivatives; Ceftin; Other; and Morphine  Family History  Problem Relation Age of Onset  . Rheum arthritis Mother   . Diabetes Mother   . Rheumatic fever Father   . Colon cancer Neg Hx   . Colon polyps Neg Hx   . Diabetes      grandparents  . Skin cancer      grandfather  . Hypertension      grandparent  . Congestive Heart Failure      grandfather  . Parkinson's disease  grandmother  .  Pancreatitis Mother     Social History   Social History  . Marital Status: Married    Spouse Name: N/A  . Number of Children: N/A  . Years of Education: N/A   Occupational History  . Case Mgr RN [employer]   Social History Main Topics  . Smoking status: Never Smoker   . Smokeless tobacco: Never Used  . Alcohol Use: No  . Drug Use: No  . Sexual Activity:    Partners: Male    Birth Control/ Protection: Surgical     Comment: BTL   Other Topics Concern  . Not on file   Social History Narrative    Review of Systems  Constitutional: Negative.   HENT: Negative.   Eyes: Negative.   Respiratory: Negative.   Cardiovascular: Negative.   Gastrointestinal: Negative.   Genitourinary:       Knot in groin area   Musculoskeletal: Negative.   Skin: Negative.   Neurological: Negative.   Endo/Heme/Allergies: Negative.   Psychiatric/Behavioral: Negative.     PHYSICAL EXAMINATION:    BP 120/60 mmHg  Pulse 88  Resp 16  Wt 155 lb (70.308 kg)  LMP 02/01/1999    General appearance: alert, cooperative and appears stated age  Pelvic: External genitalia:  no lesions              Left upper buttock with a 1 cm tender abscess, mild erythema    Chaperone was present for exam.  ASSESSMENT Skin abscess, left upper buttock    PLAN Warm compresses, hot soaks Treat with Doxycyline Call if worsening or not improving   An After Visit Summary was printed and given to the patient.

## 2015-04-27 NOTE — Patient Instructions (Signed)
Use warm compresses or soak in a sitz bath 3-4 x a day

## 2015-04-28 ENCOUNTER — Encounter (HOSPITAL_COMMUNITY): Admission: RE | Admit: 2015-04-28 | Payer: Medicare Other | Source: Ambulatory Visit

## 2015-05-05 ENCOUNTER — Encounter (HOSPITAL_COMMUNITY)
Admission: RE | Admit: 2015-05-05 | Discharge: 2015-05-05 | Disposition: A | Discharge status: Home / Self Care | Payer: Medicare Other | Source: Ambulatory Visit | Attending: Rheumatology | Admitting: Rheumatology

## 2015-05-05 DIAGNOSIS — L4050 Arthropathic psoriasis, unspecified: Secondary | ICD-10-CM | POA: Diagnosis not present

## 2015-05-05 LAB — CBC WITH DIFFERENTIAL/PLATELET
Basophils Absolute: 0.1 10*3/uL (ref 0.0–0.1)
Basophils Relative: 1 %
Eosinophils Absolute: 0.7 10*3/uL (ref 0.0–0.7)
Eosinophils Relative: 8 %
HCT: 40.8 % (ref 36.0–46.0)
Hemoglobin: 13.2 g/dL (ref 12.0–15.0)
Lymphocytes Relative: 33 %
Lymphs Abs: 3.2 10*3/uL (ref 0.7–4.0)
MCH: 30.5 pg (ref 26.0–34.0)
MCHC: 32.4 g/dL (ref 30.0–36.0)
MCV: 94.2 fL (ref 78.0–100.0)
Monocytes Absolute: 1.2 10*3/uL — ABNORMAL HIGH (ref 0.1–1.0)
Monocytes Relative: 12 %
Neutro Abs: 4.5 10*3/uL (ref 1.7–7.7)
Neutrophils Relative %: 46 %
Platelets: 243 10*3/uL (ref 150–400)
RBC: 4.33 MIL/uL (ref 3.87–5.11)
RDW: 13.6 % (ref 11.5–15.5)
WBC: 9.7 10*3/uL (ref 4.0–10.5)

## 2015-05-05 LAB — COMPREHENSIVE METABOLIC PANEL
ALT: 36 U/L (ref 14–54)
AST: 30 U/L (ref 15–41)
Albumin: 3.6 g/dL (ref 3.5–5.0)
Alkaline Phosphatase: 62 U/L (ref 38–126)
Anion gap: 10 (ref 5–15)
BUN: 21 mg/dL — ABNORMAL HIGH (ref 6–20)
CO2: 22 mmol/L (ref 22–32)
Calcium: 9.1 mg/dL (ref 8.9–10.3)
Chloride: 106 mmol/L (ref 101–111)
Creatinine, Ser: 0.92 mg/dL (ref 0.44–1.00)
GFR calc Af Amer: >60 mL/min (ref >60)
GFR calc non Af Amer: >60 mL/min (ref >60)
Glucose, Bld: 279 mg/dL — ABNORMAL HIGH (ref 65–99)
Potassium: 3.8 mmol/L (ref 3.5–5.1)
Sodium: 138 mmol/L (ref 135–145)
Total Bilirubin: 0.6 mg/dL (ref 0.3–1.2)
Total Protein: 6.4 g/dL — ABNORMAL LOW (ref 6.5–8.1)

## 2015-05-05 MED ORDER — SODIUM CHLORIDE 0.9 % IV SOLN
6.0000 mg/kg | Freq: Once | INTRAVENOUS | Status: AC
  Administered 2015-05-05: 400 mg via INTRAVENOUS
  Filled 2015-05-05: qty 40

## 2015-05-05 MED ORDER — SODIUM CHLORIDE 0.9 % IV SOLN
Freq: Once | INTRAVENOUS | Status: AC
  Administered 2015-05-05: 250 mL via INTRAVENOUS

## 2015-05-05 NOTE — Progress Notes (Signed)
Results for CHARLIE, CHAR (MRN [id number]) as of 05/05/2015 15:27  Ref. Range 05/05/2015 10:30  Sodium Latest Ref Range: 135-145 mmol/L 138  Potassium Latest Ref Range: 3.5-5.1 mmol/L 3.8  Chloride Latest Ref Range: 101-111 mmol/L 106  CO2 Latest Ref Range: 22-32 mmol/L 22  BUN Latest Ref Range: 6-20 mg/dL 21 (H)  Creatinine Latest Ref Range: 0.44-1.00 mg/dL 0.92  Calcium Latest Ref Range: 8.9-10.3 mg/dL 9.1  EGFR (Non-African Amer.) Latest Ref Range: >60 mL/min >60  EGFR (African American) Latest Ref Range: >60 mL/min >60  Glucose Latest Ref Range: 65-99 mg/dL 279 (H)  Anion gap Latest Ref Range: 5-15  10  Alkaline Phosphatase Latest Ref Range: 38-126 U/L 62  Albumin Latest Ref Range: 3.5-5.0 g/dL 3.6  AST Latest Ref Range: 15-41 U/L 30  ALT Latest Ref Range: 14-54 U/L 36  Total Protein Latest Ref Range: 6.5-8.1 g/dL 6.4 (L)  Total Bilirubin Latest Ref Range: 0.3-1.2 mg/dL 0.6  WBC Latest Ref Range: 4.0-10.5 K/uL 9.7  RBC Latest Ref Range: 3.87-5.11 MIL/uL 4.33  Hemoglobin Latest Ref Range: 12.0-15.0 g/dL 13.2  HCT Latest Ref Range: 36.0-46.0 % 40.8  MCV Latest Ref Range: 78.0-100.0 fL 94.2  MCH Latest Ref Range: 26.0-34.0 pg 30.5  MCHC Latest Ref Range: 30.0-36.0 g/dL 32.4  RDW Latest Ref Range: 11.5-15.5 % 13.6  Platelets Latest Ref Range: 150-400 K/uL 243  Neutrophils Latest Units: % 46  Lymphocytes Latest Units: % 33  Monocytes Relative Latest Units: % 12  Eosinophil Latest Units: % 8  Basophil Latest Units: % 1  NEUT# Latest Ref Range: 1.7-7.7 K/uL 4.5  Lymphocyte # Latest Ref Range: 0.7-4.0 K/uL 3.2  Monocyte # Latest Ref Range: 0.1-1.0 K/uL 1.2 (H)  Eosinophils Absolute Latest Ref Range: 0.0-0.7 K/uL 0.7  Basophils Absolute Latest Ref Range: 0.0-0.1 K/uL 0.1

## 2015-05-09 DIAGNOSIS — M25552 Pain in left hip: Secondary | ICD-10-CM | POA: Diagnosis not present

## 2015-05-09 DIAGNOSIS — Z09 Encounter for follow-up examination after completed treatment for conditions other than malignant neoplasm: Secondary | ICD-10-CM | POA: Diagnosis not present

## 2015-05-09 DIAGNOSIS — L400 Psoriasis vulgaris: Secondary | ICD-10-CM | POA: Diagnosis not present

## 2015-05-10 ENCOUNTER — Other Ambulatory Visit: Payer: Self-pay | Admitting: Obstetrics & Gynecology

## 2015-05-10 DIAGNOSIS — Z1231 Encounter for screening mammogram for malignant neoplasm of breast: Secondary | ICD-10-CM

## 2015-05-11 ENCOUNTER — Ambulatory Visit (HOSPITAL_COMMUNITY): Payer: Medicare Other

## 2015-05-11 LAB — QUANTIFERON TB GOLD ASSAY (BLOOD)

## 2015-05-11 LAB — QUANTIFERON IN TUBE
QFT TB AG MINUS NIL VALUE: <0 IU/mL
QUANTIFERON MITOGEN VALUE: >10 IU/mL
QUANTIFERON TB AG VALUE: 0.27 IU/mL
QUANTIFERON TB GOLD: NEGATIVE
Quantiferon Nil Value: 0.7 IU/mL

## 2015-05-12 ENCOUNTER — Ambulatory Visit (INDEPENDENT_AMBULATORY_CARE_PROVIDER_SITE_OTHER): Payer: Medicare Other | Admitting: Otolaryngology

## 2015-05-12 DIAGNOSIS — H903 Sensorineural hearing loss, bilateral: Secondary | ICD-10-CM

## 2015-05-12 DIAGNOSIS — H6983 Other specified disorders of Eustachian tube, bilateral: Secondary | ICD-10-CM | POA: Diagnosis not present

## 2015-05-13 ENCOUNTER — Other Ambulatory Visit (HOSPITAL_COMMUNITY): Payer: Self-pay | Admitting: Rheumatology

## 2015-05-13 DIAGNOSIS — M25552 Pain in left hip: Secondary | ICD-10-CM

## 2015-05-18 ENCOUNTER — Ambulatory Visit (HOSPITAL_COMMUNITY)
Admission: RE | Admit: 2015-05-18 | Discharge: 2015-05-18 | Disposition: A | Discharge status: Home / Self Care | Payer: Medicare Other | Source: Ambulatory Visit | Attending: Rheumatology | Admitting: Rheumatology

## 2015-05-18 ENCOUNTER — Ambulatory Visit (HOSPITAL_COMMUNITY): Admission: RE | Admit: 2015-05-18 | Payer: Medicare Other | Source: Ambulatory Visit

## 2015-05-18 DIAGNOSIS — M25551 Pain in right hip: Secondary | ICD-10-CM | POA: Diagnosis not present

## 2015-05-18 DIAGNOSIS — S32592A Other specified fracture of left pubis, initial encounter for closed fracture: Secondary | ICD-10-CM | POA: Diagnosis not present

## 2015-05-18 DIAGNOSIS — S32415A Nondisplaced fracture of anterior wall of left acetabulum, initial encounter for closed fracture: Secondary | ICD-10-CM | POA: Diagnosis not present

## 2015-05-18 DIAGNOSIS — M25552 Pain in left hip: Secondary | ICD-10-CM | POA: Diagnosis not present

## 2015-05-18 DIAGNOSIS — S32512A Fracture of superior rim of left pubis, initial encounter for closed fracture: Secondary | ICD-10-CM | POA: Diagnosis not present

## 2015-05-18 DIAGNOSIS — X58XXXA Exposure to other specified factors, initial encounter: Secondary | ICD-10-CM | POA: Insufficient documentation

## 2015-05-19 DIAGNOSIS — M810 Age-related osteoporosis without current pathological fracture: Secondary | ICD-10-CM | POA: Diagnosis not present

## 2015-05-19 DIAGNOSIS — E119 Type 2 diabetes mellitus without complications: Secondary | ICD-10-CM | POA: Diagnosis not present

## 2015-05-19 DIAGNOSIS — E559 Vitamin D deficiency, unspecified: Secondary | ICD-10-CM | POA: Diagnosis not present

## 2015-05-19 DIAGNOSIS — I10 Essential (primary) hypertension: Secondary | ICD-10-CM | POA: Diagnosis not present

## 2015-05-23 DIAGNOSIS — E119 Type 2 diabetes mellitus without complications: Secondary | ICD-10-CM | POA: Diagnosis not present

## 2015-05-23 DIAGNOSIS — E785 Hyperlipidemia, unspecified: Secondary | ICD-10-CM | POA: Diagnosis not present

## 2015-05-23 DIAGNOSIS — R600 Localized edema: Secondary | ICD-10-CM | POA: Diagnosis not present

## 2015-05-25 DIAGNOSIS — S32592A Other specified fracture of left pubis, initial encounter for closed fracture: Secondary | ICD-10-CM | POA: Diagnosis not present

## 2015-05-27 DIAGNOSIS — E119 Type 2 diabetes mellitus without complications: Secondary | ICD-10-CM | POA: Diagnosis not present

## 2015-05-27 DIAGNOSIS — R609 Edema, unspecified: Secondary | ICD-10-CM | POA: Diagnosis not present

## 2015-05-27 DIAGNOSIS — Z6831 Body mass index (BMI) 31.0-31.9, adult: Secondary | ICD-10-CM | POA: Diagnosis not present

## 2015-06-15 DIAGNOSIS — S32592D Other specified fracture of left pubis, subsequent encounter for fracture with routine healing: Secondary | ICD-10-CM | POA: Diagnosis not present

## 2015-06-16 ENCOUNTER — Ambulatory Visit (HOSPITAL_COMMUNITY)
Admission: RE | Admit: 2015-06-16 | Discharge: 2015-06-16 | Disposition: A | Discharge status: Home / Self Care | Payer: Medicare Other | Source: Ambulatory Visit | Attending: Obstetrics & Gynecology | Admitting: Obstetrics & Gynecology

## 2015-06-16 ENCOUNTER — Encounter (HOSPITAL_COMMUNITY)
Admission: RE | Admit: 2015-06-16 | Discharge: 2015-06-16 | Disposition: A | Discharge status: Home / Self Care | Payer: Medicare Other | Source: Ambulatory Visit | Attending: Rheumatology | Admitting: Rheumatology

## 2015-06-16 DIAGNOSIS — L4050 Arthropathic psoriasis, unspecified: Secondary | ICD-10-CM | POA: Diagnosis not present

## 2015-06-16 DIAGNOSIS — Z1231 Encounter for screening mammogram for malignant neoplasm of breast: Secondary | ICD-10-CM | POA: Diagnosis not present

## 2015-06-16 MED ORDER — SODIUM CHLORIDE 0.9 % IV SOLN
5.0000 mg/kg | INTRAVENOUS | Status: DC
  Administered 2015-06-16: 300 mg via INTRAVENOUS
  Filled 2015-06-16: qty 30

## 2015-06-16 MED ORDER — ACETAMINOPHEN 325 MG PO TABS: 650.0000 mg | ORAL_TABLET | Freq: Once | ORAL | Status: DC

## 2015-06-16 MED ORDER — SODIUM CHLORIDE 0.9 % IV SOLN
INTRAVENOUS | Status: DC
  Administered 2015-06-16: 10 mL/h via INTRAVENOUS

## 2015-06-16 MED ORDER — DIPHENHYDRAMINE HCL 25 MG PO CAPS
25.0000 mg | ORAL_CAPSULE | Freq: Once | ORAL | Status: DC
  Filled 2015-06-16: qty 1

## 2015-06-23 ENCOUNTER — Ambulatory Visit (INDEPENDENT_AMBULATORY_CARE_PROVIDER_SITE_OTHER): Payer: Medicare Other | Admitting: Otolaryngology

## 2015-06-23 DIAGNOSIS — H9042 Sensorineural hearing loss, unilateral, left ear, with unrestricted hearing on the contralateral side: Secondary | ICD-10-CM

## 2015-06-23 DIAGNOSIS — H6983 Other specified disorders of Eustachian tube, bilateral: Secondary | ICD-10-CM

## 2015-07-28 ENCOUNTER — Encounter (HOSPITAL_COMMUNITY)
Admission: RE | Admit: 2015-07-28 | Discharge: 2015-07-28 | Disposition: A | Discharge status: Home / Self Care | Payer: Medicare Other | Source: Ambulatory Visit | Attending: Rheumatology | Admitting: Rheumatology

## 2015-07-28 ENCOUNTER — Ambulatory Visit (HOSPITAL_COMMUNITY): Admission: RE | Admit: 2015-07-28 | Payer: Medicare Other | Source: Ambulatory Visit

## 2015-07-28 ENCOUNTER — Encounter (HOSPITAL_COMMUNITY): Payer: Self-pay

## 2015-07-28 DIAGNOSIS — L4050 Arthropathic psoriasis, unspecified: Secondary | ICD-10-CM | POA: Insufficient documentation

## 2015-07-28 LAB — COMPREHENSIVE METABOLIC PANEL
ALT: 32 U/L (ref 14–54)
AST: 26 U/L (ref 15–41)
Albumin: 4 g/dL (ref 3.5–5.0)
Alkaline Phosphatase: 45 U/L (ref 38–126)
Anion gap: 9 (ref 5–15)
BUN: 21 mg/dL — ABNORMAL HIGH (ref 6–20)
CO2: 21 mmol/L — ABNORMAL LOW (ref 22–32)
Calcium: 9.2 mg/dL (ref 8.9–10.3)
Chloride: 110 mmol/L (ref 101–111)
Creatinine, Ser: 0.96 mg/dL (ref 0.44–1.00)
GFR calc Af Amer: >60 mL/min (ref >60)
GFR calc non Af Amer: >60 mL/min (ref >60)
Glucose, Bld: 163 mg/dL — ABNORMAL HIGH (ref 65–99)
Potassium: 3.6 mmol/L (ref 3.5–5.1)
Sodium: 140 mmol/L (ref 135–145)
Total Bilirubin: 0.5 mg/dL (ref 0.3–1.2)
Total Protein: 7.2 g/dL (ref 6.5–8.1)

## 2015-07-28 LAB — CBC WITH DIFFERENTIAL/PLATELET
Basophils Absolute: 0.1 10*3/uL (ref 0.0–0.1)
Basophils Relative: 1 %
Eosinophils Absolute: 0.5 10*3/uL (ref 0.0–0.7)
Eosinophils Relative: 6 %
HCT: 39.1 % (ref 36.0–46.0)
Hemoglobin: 13.1 g/dL (ref 12.0–15.0)
Lymphocytes Relative: 43 %
Lymphs Abs: 3.6 10*3/uL (ref 0.7–4.0)
MCH: 30.7 pg (ref 26.0–34.0)
MCHC: 33.5 g/dL (ref 30.0–36.0)
MCV: 91.6 fL (ref 78.0–100.0)
Monocytes Absolute: 0.6 10*3/uL (ref 0.1–1.0)
Monocytes Relative: 8 %
Neutro Abs: 3.4 10*3/uL (ref 1.7–7.7)
Neutrophils Relative %: 42 %
Platelets: 220 10*3/uL (ref 150–400)
RBC: 4.27 MIL/uL (ref 3.87–5.11)
RDW: 15.1 % (ref 11.5–15.5)
WBC: 8.2 10*3/uL (ref 4.0–10.5)

## 2015-07-28 MED ORDER — SODIUM CHLORIDE 0.9 % IV SOLN
400.0000 mg | Freq: Once | INTRAVENOUS | Status: AC
  Administered 2015-07-28: 400 mg via INTRAVENOUS
  Filled 2015-07-28: qty 40

## 2015-07-28 MED ORDER — SODIUM CHLORIDE 0.9 % IV SOLN
INTRAVENOUS | Status: DC
  Administered 2015-07-28: 250 mL via INTRAVENOUS

## 2015-07-28 NOTE — Progress Notes (Addendum)
Results for Tonya Hall, Tonya Hall (MRN [id number]) as of 07/28/2015 10:03 Patient arrived for labs and Remicade infusion, tolerated well. Next appointment 09/08/2015 @ 0800   Ref. Range 07/28/2015 08:25  Sodium Latest Ref Range: 135-145 mmol/L 140  Potassium Latest Ref Range: 3.5-5.1 mmol/L 3.6  Chloride Latest Ref Range: 101-111 mmol/L 110  CO2 Latest Ref Range: 22-32 mmol/L 21 (L)  BUN Latest Ref Range: 6-20 mg/dL 21 (H)  Creatinine Latest Ref Range: 0.44-1.00 mg/dL 0.96  Calcium Latest Ref Range: 8.9-10.3 mg/dL 9.2  EGFR (Non-African Amer.) Latest Ref Range: >60 mL/min >60  EGFR (African American) Latest Ref Range: >60 mL/min >60  Glucose Latest Ref Range: 65-99 mg/dL 163 (H)  Anion gap Latest Ref Range: 5-15  9  Alkaline Phosphatase Latest Ref Range: 38-126 U/L 45  Albumin Latest Ref Range: 3.5-5.0 g/dL 4.0  AST Latest Ref Range: 15-41 U/L 26  ALT Latest Ref Range: 14-54 U/L 32  Total Protein Latest Ref Range: 6.5-8.1 g/dL 7.2  Total Bilirubin Latest Ref Range: 0.3-1.2 mg/dL 0.5  WBC Latest Ref Range: 4.0-10.5 K/uL 8.2  RBC Latest Ref Range: 3.87-5.11 MIL/uL 4.27  Hemoglobin Latest Ref Range: 12.0-15.0 g/dL 13.1  HCT Latest Ref Range: 36.0-46.0 % 39.1  MCV Latest Ref Range: 78.0-100.0 fL 91.6  MCH Latest Ref Range: 26.0-34.0 pg 30.7  MCHC Latest Ref Range: 30.0-36.0 g/dL 33.5  RDW Latest Ref Range: 11.5-15.5 % 15.1  Platelets Latest Ref Range: 150-400 K/uL 220  Neutrophils Latest Units: % 42  Lymphocytes Latest Units: % 43  Monocytes Relative Latest Units: % 8  Eosinophil Latest Units: % 6  Basophil Latest Units: % 1  NEUT# Latest Ref Range: 1.7-7.7 K/uL 3.4  Lymphocyte # Latest Ref Range: 0.7-4.0 K/uL 3.6  Monocyte # Latest Ref Range: 0.1-1.0 K/uL 0.6  Eosinophils Absolute Latest Ref Range: 0.0-0.7 K/uL 0.5  Basophils Absolute Latest Ref Range: 0.0-0.1 K/uL 0.1

## 2015-08-02 DIAGNOSIS — M7072 Other bursitis of hip, left hip: Secondary | ICD-10-CM | POA: Diagnosis not present

## 2015-08-10 ENCOUNTER — Other Ambulatory Visit: Payer: Self-pay | Admitting: Gastroenterology

## 2015-08-10 NOTE — Telephone Encounter (Signed)
Patient last seen in 05/2012. Needs OV or can get future refills from PCP. I did refill her protonix once.

## 2015-08-11 NOTE — Telephone Encounter (Signed)
Pt aware and appt made.

## 2015-08-12 ENCOUNTER — Ambulatory Visit (INDEPENDENT_AMBULATORY_CARE_PROVIDER_SITE_OTHER): Payer: Medicare Other | Admitting: Obstetrics & Gynecology

## 2015-08-12 ENCOUNTER — Encounter: Payer: Self-pay | Admitting: Obstetrics & Gynecology

## 2015-08-12 VITALS — BP 118/58 | HR 80 | Resp 12 | Ht 59.0 in | Wt 146.2 lb

## 2015-08-12 DIAGNOSIS — Z0000 Encounter for general adult medical examination without abnormal findings: Secondary | ICD-10-CM

## 2015-08-12 DIAGNOSIS — Z01419 Encounter for gynecological examination (general) (routine) without abnormal findings: Secondary | ICD-10-CM

## 2015-08-12 DIAGNOSIS — Z124 Encounter for screening for malignant neoplasm of cervix: Secondary | ICD-10-CM

## 2015-08-12 LAB — POCT URINALYSIS DIPSTICK
Bilirubin, UA: NEGATIVE
Blood, UA: NEGATIVE
Glucose, UA: NEGATIVE
Ketones, UA: NEGATIVE
Leukocytes, UA: NEGATIVE
Nitrite, UA: NEGATIVE
Protein, UA: NEGATIVE
Urobilinogen, UA: NEGATIVE
pH, UA: 6

## 2015-08-12 NOTE — Progress Notes (Addendum)
65 y.o. G3P2 MarriedCaucasianF here for annual exam.  Reports she was sitting on an antique organ stool.  She broke her pelvis in two spots.  H/o osteoporosis.  On Reclast and sees Dr. Chalmers Cater.    Pt's mother is still alive.  She is 7 and had to have AKA of right leg due to PVD.  Had multiple infections.      PCP:  Dr Willey Blade   Patient's last menstrual period was 02/01/1999.          Sexually active: Yes.    The current method of family planning is post menopausal status.    Exercising: No.  The patient does not participate in regular exercise at present. Smoker:  no  Health Maintenance: Pap:  04/30/2013 negative History of abnormal Pap:  no MMG:  06/17/2015 BIRADS 1 negative  Colonoscopy:  2010 repeat 10 years  BMD:   01/2015   TDaP:  2008  Hep C:  Pt reports she has been tested Screening Labs: PCP, Hb today: PCP, Urine today: pending   reports that she has never smoked. She has never used smokeless tobacco. She reports that she does not drink alcohol or use illicit drugs.  Past Medical History  Diagnosis Date  . Lymphocytic colitis 05/26/10 TCS RMR    Responded to Entocort x 3 MOS  . Psoriatic arthritis ([place])     Dr. Katherina Right  . Degenerative disk disease   . Raynaud's disease   . Seasonal allergies   . Osteoporosis     gets Boniva every 3 months  . Hypokalemia   . Esophageal motility disorder     Non-specific, see modified barium study/speech path, BP  . Hyperlipidemia     takes Pravastatin daily  . PONV (postoperative nausea and vomiting)   . HTN (hypertension)     takes Metoprolol daily and SPironolactone daily  . Peripheral edema     takes HCTZ daily  . Shortness of breath     with exertion  . Asthma     Albuterol in haler prn  . History of bronchitis     last time early 2014  . Neuropathy ([place])   . Chronic back pain   . GERD (gastroesophageal reflux disease)     takes Protonix bid  . Inflammatory bowel disease (ulcerative colitis) ([place])   . Urinary urgency    . Anemia     at times  . History of blood transfusion     did have a rash after receiving the blood  . Cataract     immature unsure which eye  . Monilia infection 12/93  . SUI (stress urinary incontinence, female)   . Hypercholesterolemia   . History of blood transfusion     post c-section  . Diabetes mellitus without complication [facility])     Past Surgical History  Procedure Laterality Date  . Cesarean section  1974, 1978       . Tonsillectomy and adenoidectomy    . Lumbar fusion  6/03, 11/08, 11/14    L4-5 fusion, L2-3 fusion, L1-2  . Dilation and curettage of uterus  1977    spontaneous abortion  . Lumbar disc arthroplasty  7/98    Family History  Problem Relation Age of Onset  . Rheum arthritis Mother   . Diabetes Mother   . Rheumatic fever Father   . Colon cancer Neg Hx   . Colon polyps Neg Hx   . Diabetes      grandparents  . Skin cancer  grandfather  . Hypertension      grandparent  . Congestive Heart Failure      grandfather  . Parkinson's disease      grandmother  . Pancreatitis Mother     ROS:  Pertinent items are noted in HPI.  Otherwise, a comprehensive ROS was negative.  Exam:   General appearance: alert, cooperative and appears stated age Head: Normocephalic, without obvious abnormality, atraumatic Neck: no adenopathy, supple, symmetrical, trachea midline and thyroid normal to inspection and palpation Lungs: clear to auscultation bilaterally Breasts: normal appearance, no masses or tenderness Heart: regular rate and rhythm Abdomen: soft, non-tender; bowel sounds normal; no masses,  no organomegaly Extremities: extremities normal, atraumatic, no cyanosis or edema Skin: Skin color, texture, turgor normal. No rashes or lesions Lymph nodes: Cervical, supraclavicular, and axillary nodes normal. No abnormal inguinal nodes palpated Neurologic: Grossly normal   Pelvic: External genitalia:  no lesions              Urethra:  normal appearing  urethra with no masses, tenderness or lesions              Bartholins and Skenes: normal                 Vagina: normal appearing vagina with normal color and discharge, no lesions              Cervix: no lesions              Pap taken: Yes.   Bimanual Exam:  Uterus:  normal size, contour, position, consistency, mobility, non-tender              Adnexa: normal adnexa and no mass, fullness, tenderness               Rectovaginal: Confirms               Anus:  normal sphincter tone, no lesions  Chaperone was present for exam.  A:  Well Woman with normal exam PMP, no HRT Psoriatic arthritis Chronic immune suppression Elevated lipids and elevated triglycerides Diabetes Atrophic vaginal changes. Off Vagifem.  P: Mammogram yearly. pap smear with neg HR HPV 10/13. Normal pap 2015. Pap today. Labs with Drs. Balan (yearly), Fagan (every 4 months), and Deveshwar (every 4-6 months) return annually or prn

## 2015-08-12 NOTE — Addendum Note (Signed)
Addended by: Megan Salon on: 08/12/2015 01:56 PM   Modules accepted: Miquel Dunn

## 2015-08-15 LAB — IPS PAP SMEAR ONLY

## 2015-08-16 DIAGNOSIS — E559 Vitamin D deficiency, unspecified: Secondary | ICD-10-CM | POA: Diagnosis not present

## 2015-08-16 DIAGNOSIS — I10 Essential (primary) hypertension: Secondary | ICD-10-CM | POA: Diagnosis not present

## 2015-08-16 DIAGNOSIS — E119 Type 2 diabetes mellitus without complications: Secondary | ICD-10-CM | POA: Diagnosis not present

## 2015-08-16 DIAGNOSIS — M810 Age-related osteoporosis without current pathological fracture: Secondary | ICD-10-CM | POA: Diagnosis not present

## 2015-08-17 DIAGNOSIS — M5136 Other intervertebral disc degeneration, lumbar region: Secondary | ICD-10-CM | POA: Diagnosis not present

## 2015-08-17 DIAGNOSIS — M25552 Pain in left hip: Secondary | ICD-10-CM | POA: Diagnosis not present

## 2015-08-31 ENCOUNTER — Ambulatory Visit (INDEPENDENT_AMBULATORY_CARE_PROVIDER_SITE_OTHER): Payer: Medicare Other | Admitting: Gastroenterology

## 2015-08-31 ENCOUNTER — Encounter: Payer: Self-pay | Admitting: Gastroenterology

## 2015-08-31 VITALS — BP 149/81 | HR 101 | Temp 98.0°F | Ht 60.0 in | Wt 147.0 lb

## 2015-08-31 DIAGNOSIS — K219 Gastro-esophageal reflux disease without esophagitis: Secondary | ICD-10-CM

## 2015-08-31 MED ORDER — RANITIDINE HCL 75 MG PO TABS: 75.0000 mg | ORAL_TABLET | Freq: Every day | ORAL | Status: DC

## 2015-08-31 MED ORDER — OMEPRAZOLE 20 MG PO CPDR: 20.0000 mg | DELAYED_RELEASE_CAPSULE | Freq: Every day | ORAL | Status: DC

## 2015-08-31 NOTE — Assessment & Plan Note (Signed)
Doing well with Prilosec once daily and Zantac in the evening. Discussed Zantac and possible tachyphylaxis. Doing well currently. Return in 2 years or sooner if needed.

## 2015-08-31 NOTE — Patient Instructions (Signed)
I sent in Prilosec and ranitidine to your pharmacy in a 90 day supply.    It was good to see you! We will see you in 2 years or sooner if needed!   Enjoy Retirement :)

## 2015-08-31 NOTE — Progress Notes (Signed)
Referring Provider: [name], MD Primary Care Physician:  [name], MD  Primary GI: Dr. Oneida Alar   Chief Complaint  Patient presents with  . Follow-up    HPI:   Tonya Hall is a 65 y.o. female presenting today with a history of GERD, non-specific esophageal motility disorder, remote history of microscopic colitis in 2012.   Was having a cough at night. Was taking Prilosec and ranitidine started at bedtime after recommendation from ENT. Taking each evening. No dysphagia. No abdominal pain. No constipation or diarrhea. No rectal bleeding.   Past Medical History  Diagnosis Date  . Lymphocytic colitis 05/26/10 TCS RMR    Responded to Entocort x 3 MOS  . Psoriatic arthritis ([place])     Dr. Katherina Right  . Degenerative disk disease   . Raynaud's disease   . Seasonal allergies   . Osteoporosis     gets Boniva every 3 months  . Hypokalemia   . Esophageal motility disorder     Non-specific, see modified barium study/speech path, BP  . Hyperlipidemia     takes Pravastatin daily  . PONV (postoperative nausea and vomiting)   . HTN (hypertension)     takes Metoprolol daily and SPironolactone daily  . Peripheral edema     takes HCTZ daily  . Shortness of breath     with exertion  . Asthma     Albuterol in haler prn  . History of bronchitis     last time early 2014  . Neuropathy ([place])   . Chronic back pain   . GERD (gastroesophageal reflux disease)     takes Protonix bid  . Urinary urgency   . Anemia     at times  . History of blood transfusion     did have a rash after receiving the blood  . Cataract     immature unsure which eye  . Monilia infection 12/93  . SUI (stress urinary incontinence, female)   . Hypercholesterolemia   . History of blood transfusion     post c-section  . Diabetes mellitus without complication [facility])     Past Surgical History  Procedure Laterality Date  . Cesarean section  1974, 1978       . Tonsillectomy and adenoidectomy    . Lumbar  fusion  6/03, 11/08, 11/14    L4-5 fusion, L2-3 fusion, L1-2  . Dilation and curettage of uterus  1977    spontaneous abortion  . Lumbar disc arthroplasty  7/98  . Colonoscopy  06/19/2008    [id number] internal hemorrhoids/mild sigmoin colon diverticulosis  . Esophagogastroduodenoscopy  06/19/2008    [id number] gastritis  . Colonoscopy  2012    Dr. Gala Romney: normal rectum, diverticula, lymphocytic colitis     Current Outpatient Prescriptions  Medication Sig Dispense Refill  . albuterol (PROVENTIL HFA;VENTOLIN HFA) 108 (90 BASE) MCG/ACT inhaler Inhale 1 puff into the lungs every 6 (six) hours as needed for wheezing or shortness of breath.    Marland Kitchen aspirin 81 MG tablet Take 81 mg by mouth daily.      . calcium carbonate (OS-CAL) 600 MG TABS Take 600 mg by mouth 2 (two) times daily with a meal.     . COSENTYX SENSOREADY 300 DOSE 150 MG/ML SOAJ Reported on 08/12/2015    . cyclobenzaprine (FLEXERIL) 5 MG tablet Take 5-10 mg by mouth 2 (two) times daily. Takes 10 mg in the am, and 5 mg in the pm.    . diclofenac sodium (VOLTAREN) 1 %  GEL   2  . DULoxetine (CYMBALTA) 60 MG capsule Take 1 capsule (60 mg total) by mouth at bedtime.    . fish oil-omega-3 fatty acids 1000 MG capsule Take 2 g by mouth 2 (two) times daily.     . fluticasone (FLONASE) 50 MCG/ACT nasal spray     . FOLIC ACID PO Take 1 tablet by mouth daily.    . Ginger, Zingiber officinalis, (GINGER ROOT) 500 MG CAPS Take 1 capsule by mouth daily.     . Glucosamine Sulfate 1000 MG CAPS Take 2 capsules (2,000 mg total) by mouth daily.    . InFLIXimab (REMICADE IV) Inject into the vein every 6 (six) weeks.     Marland Kitchen ipratropium (ATROVENT) 0.06 % nasal spray Place 2 sprays into both nostrils 4 (four) times daily. 15 mL 0  . leflunomide (ARAVA) 20 MG tablet Take 10 mg by mouth. 10mg  6 days/weekly    . metoprolol succinate (TOPROL-XL) 50 MG 24 hr tablet Take 1 tablet (50 mg total) by mouth daily. Take with or immediately following a meal.    . Misc  Natural Products (TART CHERRY ADVANCED PO) Take by mouth. daily    . Multiple Vitamin (MULTIVITAMIN) tablet Take 1 tablet by mouth daily.      . potassium chloride SA (K-DUR,KLOR-CON) 20 MEQ tablet Take 20 mEq by mouth 2 (two) times daily.    . pravastatin (PRAVACHOL) 40 MG tablet Take 40 mg by mouth daily.    . predniSONE (DELTASONE) 5 MG tablet Take 5 mg by mouth daily with breakfast.    . PROBIOTIC CAPS Take 1 capsule by mouth daily.    . ranitidine (ZANTAC) 150 MG tablet     . SOLU-CORTEF 100 MG SOLR injection Reported on 08/12/2015    . spironolactone (ALDACTONE) 25 MG tablet Take 25 mg by mouth daily.     Marland Kitchen topiramate (TOPAMAX) 200 MG tablet Take 1 tablet (200 mg total) by mouth at bedtime.    . Turmeric 500 MG CAPS Take 1 capsule by mouth daily.     . Zoledronic Acid (RECLAST IV) Inject into the vein.     Marland Kitchen HYDROcodone-acetaminophen (NORCO) 10-325 MG per tablet Take 0.5 tablets by mouth every 6 (six) hours as needed for moderate pain. Reported on 08/31/2015    . omeprazole (PRILOSEC) 20 MG capsule Take 1 capsule (20 mg total) by mouth daily. 90 capsule 3  . ranitidine (ZANTAC) 75 MG tablet Take 1 tablet (75 mg total) by mouth at bedtime. Reported on 08/31/2015 90 tablet 3  . [DISCONTINUED] Glucosamine 500 MG TABS Take 4 tablets by mouth daily.      . [DISCONTINUED] simvastatin (ZOCOR) 40 MG tablet Take 20 mg by mouth at bedtime.      No current facility-administered medications for this visit.    Allergies as of 08/31/2015 - Review Complete 08/31/2015  Allergen Reaction Noted  . Adalimumab Rash and Other (See Comments)   . Imuran [azathioprine sodium] Rash 07/03/2011  . Methotrexate Other (See Comments)   . Plaquenil [hydroxychloroquine sulfate] Rash 07/03/2011  . Sulfonamide derivatives Rash   . Ceftin [cefuroxime axetil] Nausea Only 07/06/2014  . Other  04/28/2013  . Morphine Nausea And Vomiting     Family History  Problem Relation Age of Onset  . Rheum arthritis Mother   .  Diabetes Mother   . Rheumatic fever Father   . Colon polyps Neg Hx   . Diabetes      grandparents  . Skin  cancer      grandfather  . Hypertension      grandparent  . Congestive Heart Failure      grandfather  . Parkinson's disease      grandmother  . Pancreatitis Mother   . Colon cancer Maternal Uncle     Social History   Social History  . Marital Status: Married    Spouse Name: N/A  . Number of Children: N/A  . Years of Education: N/A   Occupational History  . 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 Smoking status: Never Smoker   . Smokeless tobacco: Never Used  . Alcohol Use: No  . Drug Use: No  . Sexual Activity:    Partners: Male    Birth Control/ Protection: Surgical     Comment: BTL   Other Topics Concern  . None   Social History Narrative    Review of Systems: Negative unless mentioned in HPI.   Physical Exam: BP 149/81 mmHg  Pulse 101  Temp(Src) 98 F (36.7 C) (Oral)  Ht 5' (1.524 m)  Wt 147 lb (66.679 kg)  BMI 28.71 kg/m2  LMP 02/01/1999 General:   Alert and oriented. No distress noted. Pleasant and cooperative.  Head:  Normocephalic and atraumatic. Eyes:  Conjuctiva clear without scleral icterus. Mouth:  Oral mucosa pink and moist. Good dentition. No lesions. Heart:  S1, S2 present without murmurs, rubs, or gallops. Regular rate and rhythm. Abdomen:  +BS, soft, non-tender and non-distended. No rebound or guarding. No HSM or masses noted. Msk:  Symmetrical without gross deformities. Normal posture. Extremities:  Without edema. Neurologic:  Alert and  oriented x4;  grossly normal neurologically. Psych:  Alert and cooperative. Normal mood and affect.

## 2015-09-01 NOTE — Progress Notes (Signed)
cc'ed to pcp °

## 2015-09-06 DIAGNOSIS — Z5181 Encounter for therapeutic drug level monitoring: Secondary | ICD-10-CM | POA: Diagnosis not present

## 2015-09-08 ENCOUNTER — Encounter (HOSPITAL_COMMUNITY)
Admission: RE | Admit: 2015-09-08 | Discharge: 2015-09-08 | Disposition: A | Discharge status: Home / Self Care | Payer: Medicare Other | Source: Ambulatory Visit | Attending: Rheumatology | Admitting: Rheumatology

## 2015-09-12 DIAGNOSIS — L408 Other psoriasis: Secondary | ICD-10-CM | POA: Diagnosis not present

## 2015-09-12 DIAGNOSIS — M5137 Other intervertebral disc degeneration, lumbosacral region: Secondary | ICD-10-CM | POA: Diagnosis not present

## 2015-09-12 DIAGNOSIS — Z09 Encounter for follow-up examination after completed treatment for conditions other than malignant neoplasm: Secondary | ICD-10-CM | POA: Diagnosis not present

## 2015-09-12 DIAGNOSIS — M79641 Pain in right hand: Secondary | ICD-10-CM | POA: Diagnosis not present

## 2015-09-12 DIAGNOSIS — M7072 Other bursitis of hip, left hip: Secondary | ICD-10-CM | POA: Diagnosis not present

## 2015-09-29 DIAGNOSIS — Z79899 Other long term (current) drug therapy: Secondary | ICD-10-CM | POA: Diagnosis not present

## 2015-10-25 DIAGNOSIS — H5212 Myopia, left eye: Secondary | ICD-10-CM | POA: Diagnosis not present

## 2015-10-25 DIAGNOSIS — H52221 Regular astigmatism, right eye: Secondary | ICD-10-CM | POA: Diagnosis not present

## 2015-10-25 DIAGNOSIS — H25043 Posterior subcapsular polar age-related cataract, bilateral: Secondary | ICD-10-CM | POA: Diagnosis not present

## 2015-10-25 DIAGNOSIS — H524 Presbyopia: Secondary | ICD-10-CM | POA: Diagnosis not present

## 2015-11-04 DIAGNOSIS — M461 Sacroiliitis, not elsewhere classified: Secondary | ICD-10-CM | POA: Diagnosis not present

## 2015-11-30 DIAGNOSIS — M6281 Muscle weakness (generalized): Secondary | ICD-10-CM | POA: Diagnosis not present

## 2015-11-30 DIAGNOSIS — R278 Other lack of coordination: Secondary | ICD-10-CM | POA: Diagnosis not present

## 2015-11-30 DIAGNOSIS — N3946 Mixed incontinence: Secondary | ICD-10-CM | POA: Diagnosis not present

## 2015-11-30 DIAGNOSIS — M461 Sacroiliitis, not elsewhere classified: Secondary | ICD-10-CM | POA: Diagnosis not present

## 2015-11-30 DIAGNOSIS — R262 Difficulty in walking, not elsewhere classified: Secondary | ICD-10-CM | POA: Diagnosis not present

## 2015-11-30 DIAGNOSIS — M62838 Other muscle spasm: Secondary | ICD-10-CM | POA: Diagnosis not present

## 2015-12-06 DIAGNOSIS — Z79899 Other long term (current) drug therapy: Secondary | ICD-10-CM | POA: Diagnosis not present

## 2015-12-08 DIAGNOSIS — M79641 Pain in right hand: Secondary | ICD-10-CM | POA: Diagnosis not present

## 2015-12-08 DIAGNOSIS — L408 Other psoriasis: Secondary | ICD-10-CM | POA: Diagnosis not present

## 2015-12-08 DIAGNOSIS — M62838 Other muscle spasm: Secondary | ICD-10-CM | POA: Diagnosis not present

## 2015-12-08 DIAGNOSIS — M6281 Muscle weakness (generalized): Secondary | ICD-10-CM | POA: Diagnosis not present

## 2015-12-08 DIAGNOSIS — R278 Other lack of coordination: Secondary | ICD-10-CM | POA: Diagnosis not present

## 2015-12-08 DIAGNOSIS — Z09 Encounter for follow-up examination after completed treatment for conditions other than malignant neoplasm: Secondary | ICD-10-CM | POA: Diagnosis not present

## 2015-12-08 DIAGNOSIS — R262 Difficulty in walking, not elsewhere classified: Secondary | ICD-10-CM | POA: Diagnosis not present

## 2015-12-08 DIAGNOSIS — M461 Sacroiliitis, not elsewhere classified: Secondary | ICD-10-CM | POA: Diagnosis not present

## 2015-12-12 DIAGNOSIS — Z79899 Other long term (current) drug therapy: Secondary | ICD-10-CM | POA: Diagnosis not present

## 2015-12-12 DIAGNOSIS — E119 Type 2 diabetes mellitus without complications: Secondary | ICD-10-CM | POA: Diagnosis not present

## 2015-12-12 DIAGNOSIS — E785 Hyperlipidemia, unspecified: Secondary | ICD-10-CM | POA: Diagnosis not present

## 2015-12-12 DIAGNOSIS — E875 Hyperkalemia: Secondary | ICD-10-CM | POA: Diagnosis not present

## 2015-12-14 DIAGNOSIS — R262 Difficulty in walking, not elsewhere classified: Secondary | ICD-10-CM | POA: Diagnosis not present

## 2015-12-14 DIAGNOSIS — M461 Sacroiliitis, not elsewhere classified: Secondary | ICD-10-CM | POA: Diagnosis not present

## 2015-12-14 DIAGNOSIS — M62838 Other muscle spasm: Secondary | ICD-10-CM | POA: Diagnosis not present

## 2015-12-14 DIAGNOSIS — M6281 Muscle weakness (generalized): Secondary | ICD-10-CM | POA: Diagnosis not present

## 2015-12-14 DIAGNOSIS — N3946 Mixed incontinence: Secondary | ICD-10-CM | POA: Diagnosis not present

## 2015-12-14 DIAGNOSIS — R278 Other lack of coordination: Secondary | ICD-10-CM | POA: Diagnosis not present

## 2015-12-16 DIAGNOSIS — M6281 Muscle weakness (generalized): Secondary | ICD-10-CM | POA: Diagnosis not present

## 2015-12-16 DIAGNOSIS — M461 Sacroiliitis, not elsewhere classified: Secondary | ICD-10-CM | POA: Diagnosis not present

## 2015-12-16 DIAGNOSIS — M62838 Other muscle spasm: Secondary | ICD-10-CM | POA: Diagnosis not present

## 2015-12-16 DIAGNOSIS — R278 Other lack of coordination: Secondary | ICD-10-CM | POA: Diagnosis not present

## 2015-12-16 DIAGNOSIS — R262 Difficulty in walking, not elsewhere classified: Secondary | ICD-10-CM | POA: Diagnosis not present

## 2015-12-16 DIAGNOSIS — E119 Type 2 diabetes mellitus without complications: Secondary | ICD-10-CM | POA: Diagnosis not present

## 2015-12-16 DIAGNOSIS — E785 Hyperlipidemia, unspecified: Secondary | ICD-10-CM | POA: Diagnosis not present

## 2015-12-29 ENCOUNTER — Ambulatory Visit (INDEPENDENT_AMBULATORY_CARE_PROVIDER_SITE_OTHER): Payer: Medicare Other | Admitting: Otolaryngology

## 2015-12-29 DIAGNOSIS — K1230 Oral mucositis (ulcerative), unspecified: Secondary | ICD-10-CM | POA: Diagnosis not present

## 2015-12-29 DIAGNOSIS — H6983 Other specified disorders of Eustachian tube, bilateral: Secondary | ICD-10-CM | POA: Diagnosis not present

## 2016-01-04 DIAGNOSIS — R262 Difficulty in walking, not elsewhere classified: Secondary | ICD-10-CM | POA: Diagnosis not present

## 2016-01-04 DIAGNOSIS — M6281 Muscle weakness (generalized): Secondary | ICD-10-CM | POA: Diagnosis not present

## 2016-01-04 DIAGNOSIS — M461 Sacroiliitis, not elsewhere classified: Secondary | ICD-10-CM | POA: Diagnosis not present

## 2016-01-04 DIAGNOSIS — R278 Other lack of coordination: Secondary | ICD-10-CM | POA: Diagnosis not present

## 2016-01-04 DIAGNOSIS — M62838 Other muscle spasm: Secondary | ICD-10-CM | POA: Diagnosis not present

## 2016-01-10 DIAGNOSIS — R278 Other lack of coordination: Secondary | ICD-10-CM | POA: Diagnosis not present

## 2016-01-10 DIAGNOSIS — E559 Vitamin D deficiency, unspecified: Secondary | ICD-10-CM | POA: Diagnosis not present

## 2016-01-10 DIAGNOSIS — M6281 Muscle weakness (generalized): Secondary | ICD-10-CM | POA: Diagnosis not present

## 2016-01-10 DIAGNOSIS — M461 Sacroiliitis, not elsewhere classified: Secondary | ICD-10-CM | POA: Diagnosis not present

## 2016-01-10 DIAGNOSIS — E119 Type 2 diabetes mellitus without complications: Secondary | ICD-10-CM | POA: Diagnosis not present

## 2016-01-10 DIAGNOSIS — M62838 Other muscle spasm: Secondary | ICD-10-CM | POA: Diagnosis not present

## 2016-01-16 DIAGNOSIS — I10 Essential (primary) hypertension: Secondary | ICD-10-CM | POA: Diagnosis not present

## 2016-01-16 DIAGNOSIS — M810 Age-related osteoporosis without current pathological fracture: Secondary | ICD-10-CM | POA: Diagnosis not present

## 2016-01-16 DIAGNOSIS — E559 Vitamin D deficiency, unspecified: Secondary | ICD-10-CM | POA: Diagnosis not present

## 2016-01-16 DIAGNOSIS — E119 Type 2 diabetes mellitus without complications: Secondary | ICD-10-CM | POA: Diagnosis not present

## 2016-01-23 ENCOUNTER — Other Ambulatory Visit: Payer: Self-pay | Admitting: Radiology

## 2016-01-23 MED ORDER — CYCLOBENZAPRINE HCL 5 MG PO TABS: ORAL_TABLET | ORAL | 0 refills | Status: DC

## 2016-01-23 NOTE — Telephone Encounter (Signed)
Patient has requested Flexeril tid refill, you have previously refilled at this dose, which is outside of your normal recommendations, ok to refill Flexeril tid ? You have had me previously advise her this is very sedating and to not drive while using this medication  Pended the medication/ please advise

## 2016-01-23 NOTE — Telephone Encounter (Signed)
May fill Flexeril TID. Please, inform pt to take it prn and not to drive after taking the medication.

## 2016-01-26 ENCOUNTER — Ambulatory Visit (INDEPENDENT_AMBULATORY_CARE_PROVIDER_SITE_OTHER): Payer: Medicare Other | Admitting: Otolaryngology

## 2016-02-15 ENCOUNTER — Ambulatory Visit (INDEPENDENT_AMBULATORY_CARE_PROVIDER_SITE_OTHER): Payer: Medicare Other | Admitting: Rheumatology

## 2016-02-15 ENCOUNTER — Encounter: Payer: Self-pay | Admitting: Rheumatology

## 2016-02-15 ENCOUNTER — Telehealth: Payer: Self-pay | Admitting: Rheumatology

## 2016-02-15 ENCOUNTER — Ambulatory Visit (INDEPENDENT_AMBULATORY_CARE_PROVIDER_SITE_OTHER): Payer: Medicare Other

## 2016-02-15 ENCOUNTER — Other Ambulatory Visit: Payer: Self-pay | Admitting: Radiology

## 2016-02-15 VITALS — BP 139/79 | HR 91 | Ht 59.0 in | Wt 147.0 lb

## 2016-02-15 DIAGNOSIS — Z79899 Other long term (current) drug therapy: Secondary | ICD-10-CM | POA: Diagnosis not present

## 2016-02-15 DIAGNOSIS — M461 Sacroiliitis, not elsewhere classified: Secondary | ICD-10-CM | POA: Insufficient documentation

## 2016-02-15 DIAGNOSIS — I10 Essential (primary) hypertension: Secondary | ICD-10-CM

## 2016-02-15 DIAGNOSIS — M546 Pain in thoracic spine: Secondary | ICD-10-CM

## 2016-02-15 DIAGNOSIS — M159 Polyosteoarthritis, unspecified: Secondary | ICD-10-CM

## 2016-02-15 DIAGNOSIS — Z5181 Encounter for therapeutic drug level monitoring: Secondary | ICD-10-CM

## 2016-02-15 DIAGNOSIS — M150 Primary generalized (osteo)arthritis: Secondary | ICD-10-CM

## 2016-02-15 DIAGNOSIS — M7062 Trochanteric bursitis, left hip: Secondary | ICD-10-CM

## 2016-02-15 DIAGNOSIS — M8949 Other hypertrophic osteoarthropathy, multiple sites: Secondary | ICD-10-CM

## 2016-02-15 DIAGNOSIS — G894 Chronic pain syndrome: Secondary | ICD-10-CM | POA: Insufficient documentation

## 2016-02-15 DIAGNOSIS — M810 Age-related osteoporosis without current pathological fracture: Secondary | ICD-10-CM | POA: Diagnosis not present

## 2016-02-15 DIAGNOSIS — L4050 Arthropathic psoriasis, unspecified: Secondary | ICD-10-CM

## 2016-02-15 MED ORDER — HYDROCODONE-ACETAMINOPHEN 5-325 MG PO TABS: 1.0000 | ORAL_TABLET | Freq: Two times a day (BID) | ORAL | 0 refills | Status: DC | PRN

## 2016-02-15 NOTE — Telephone Encounter (Signed)
Patient being worked in / she is coming in now

## 2016-02-15 NOTE — Progress Notes (Signed)
Office Visit Note  Patient: Tonya Hall             Date of Birth: 01-28-1951           MRN: [id number]             PCP: [name], MD Referring: [name], MD Visit Date: 02/15/2016 Occupation: Retired Marine scientist    Subjective:  Lower back pain   History of Present Illness: Tonya Hall is a 65 y.o. female with psoriatic arthritis. She states she's been traveling a car recently. She started having lower thoracic pain on the left side the night before. There is no history of injury.No history of radiculopathy. Her arthritis is not flaring. She has tried pain medications and muscle relaxers without much relief. She denies any shortness of breath. No history of fever or any other infections.  Activities of Daily Living:  Patient reports morning stiffness for 30 minutes.   Patient Reports nocturnal pain.  Difficulty dressing/grooming: Denies Difficulty climbing stairs: Reports Difficulty getting out of chair: Reports Difficulty using hands for taps, buttons, cutlery, and/or writing: Reports   Review of Systems  Constitutional: Positive for fatigue. Negative for night sweats, weight gain, weight loss and weakness.  HENT: Negative for mouth sores, trouble swallowing, trouble swallowing, mouth dryness and nose dryness.   Eyes: Negative for pain, redness, visual disturbance and dryness.  Respiratory: Negative for cough, shortness of breath and difficulty breathing.   Cardiovascular: Negative for chest pain, palpitations, hypertension, irregular heartbeat and swelling in legs/feet.  Gastrointestinal: Negative for blood in stool, constipation and diarrhea.  Endocrine: Negative for increased urination.  Genitourinary: Negative for vaginal dryness.  Musculoskeletal: Positive for arthralgias, joint pain, joint swelling and morning stiffness. Negative for myalgias, muscle weakness, muscle tenderness and myalgias.  Skin: Negative for color change, rash, hair loss, skin tightness, ulcers  and sensitivity to sunlight.  Allergic/Immunologic: Negative for susceptible to infections.  Neurological: Negative for dizziness, memory loss and night sweats.  Hematological: Negative for swollen glands.  Psychiatric/Behavioral: Positive for sleep disturbance. Negative for depressed mood. The patient is not nervous/anxious.     PMFS History:  Patient Active Problem List   Diagnosis Date Noted  . High risk medication use 02/15/2016  . Age-related osteoporosis without current pathological fracture 02/15/2016  . Trochanteric bursitis, left hip 02/15/2016  . Sacroiliitis, not elsewhere classified ([place]) 02/15/2016  . Chronic pain syndrome 02/15/2016  . Abnormal weight gain 06/25/2012  . Hypertension 07/03/2011  . Hypercholesteremia 07/03/2011  . Psoriatic arthritis ([place]) 07/03/2011  . Osteoarthritis of multiple joints 07/03/2011  . Esophageal motility disorder 02/14/2011  . Cough 02/14/2011  . Dysphagia 09/21/2010  . GERD 05/05/2010    Past Medical History:  Diagnosis Date  . Anemia    at times  . Asthma    Albuterol in haler prn  . Cataract    immature unsure which eye  . Chronic back pain   . Degenerative disk disease   . Diabetes mellitus without complication ([place])   . Esophageal motility disorder    Non-specific, see modified barium study/speech path, BP  . GERD (gastroesophageal reflux disease)    takes Protonix bid  . History of blood transfusion    did have a rash after receiving the blood  . History of blood transfusion    post c-section  . History of bronchitis    last time early 2014  . HTN (hypertension)    takes Metoprolol daily and SPironolactone daily  . Hypercholesterolemia   .  Hyperlipidemia    takes Pravastatin daily  . Hypokalemia   . Lymphocytic colitis 05/26/10 TCS RMR   Responded to Entocort x 3 MOS  . Monilia infection 12/93  . Neuropathy ([place])   . Osteoporosis    gets Boniva every 3 months  . Peripheral edema    takes HCTZ daily  . PONV  (postoperative nausea and vomiting)   . Psoriatic arthritis ([place])    Dr. Katherina Right  . Raynaud's disease   . Seasonal allergies   . Shortness of breath    with exertion  . SUI (stress urinary incontinence, female)   . Urinary urgency     Family History  Problem Relation Age of Onset  . Rheum arthritis Mother   . Diabetes Mother   . Pancreatitis Mother   . Rheumatic fever Father   . Diabetes      grandparents  . Skin cancer      grandfather  . Hypertension      grandparent  . Congestive Heart Failure      grandfather  . Parkinson's disease      grandmother  . Colon cancer Maternal Uncle   . Colon polyps Neg Hx    Past Surgical History:  Procedure Laterality Date  . CESAREAN SECTION  1974, 1978      . COLONOSCOPY  06/19/2008   [id number] internal hemorrhoids/mild sigmoin colon diverticulosis  . COLONOSCOPY  2012   Dr. Gala Romney: normal rectum, diverticula, lymphocytic colitis   . DILATION AND CURETTAGE OF UTERUS  1977   spontaneous abortion  . ESOPHAGOGASTRODUODENOSCOPY  06/19/2008   [id number] gastritis  . LUMBAR DISC ARTHROPLASTY  7/98  . LUMBAR FUSION  6/03, 11/08, 11/14   L4-5 fusion, L2-3 fusion, L1-2  . TONSILLECTOMY AND ADENOIDECTOMY     Social History   Social History Narrative  . No narrative on file     Objective: Vital Signs: BP 139/79 (BP Location: Left Arm, Patient Position: Sitting, Cuff Size: Large)   Pulse 91   Ht 4\' 11"  (1.499 m)   Wt 147 lb (66.7 kg)   LMP 02/01/1999   BMI 29.69 kg/m    Physical Exam  Constitutional: She is oriented to person, place, and time. She appears well-developed and well-nourished.  HENT:  Head: Normocephalic and atraumatic.  Eyes: Conjunctivae and EOM are normal.  Neck: Normal range of motion.  Cardiovascular: Normal rate, regular rhythm, normal heart sounds and intact distal pulses.   Pulmonary/Chest: Effort normal and breath sounds normal.  Abdominal: Soft. Bowel sounds are normal.  Lymphadenopathy:    She  has no cervical adenopathy.  Neurological: She is alert and oriented to person, place, and time.  Skin: Skin is warm and dry. Capillary refill takes less than 2 seconds.  Psychiatric: She has a normal mood and affect. Her behavior is normal.  Nursing note and vitals reviewed.    Musculoskeletal Exam: She has thoracic kyphosis. She has tenderness on palpation over lower rib cage on the left side. She also has some tenderness over L2-3 region. She has synovial thickening in her PIP/DIP joints of her hands and feet but no synovitis was noted.  CDAI Exam: No CDAI exam completed.    Investigation: Findings:  June 2017:  CBC was normal.  Comprehensive metabolic panel showed glucose of 171.  Her most recent labs from September 5 showed CBC was normal.  Comprehensive metabolic panel showed glucose of 158. Patient has had inadequate response to Plaquenil and Remicade. Currently she is treated with Cosentyx  300mg  sub q monthly    Imaging: Xr Thoracic Spine 2 View  Result Date: 02/15/2016 Multilevel spondylosis noted in the thoracic and upper lumbar region. She has postsurgical changes with hardware present at L2-3 andL 3-4 levels adjusted fused. No vertebral compression was noted. Impression: Multilevel spondylosis with some hardware showing fusion of L2-3 and L3-4. No acute compression fracture was noted.   Speciality Comments: No specialty comments available.    Procedures:  No procedures performed Allergies: Adalimumab; Imuran [azathioprine sodium]; Methotrexate; Plaquenil [hydroxychloroquine sulfate]; Sulfonamide derivatives; Ceftin [cefuroxime axetil]; Other; and Morphine   Assessment / Plan: Visit Diagnoses: Primary osteoarthritis involving multiple joints  Pain in thoracic spine -severe left-sided thoracic pain Plan: XR Thoracic Spine 2 View x-ray today did not show any acute fracture. She does have multilevel spondylosis with the postsurgical changes from prior L2-3 and L3-4  fusion. We had detailed discussion regarding this I believe the pain is most likely muscular she's been traveling by car which could have excess abated her symptoms. At this point she does not have any fever or no shortness of breath. I've advised her if her symptoms persist or she develops any new symptoms she should go to the emergency room.  Psoriatic arthritis ([place]): Stable no synovitis, positive synovial thickening on examination today.  High risk medication use: She is on Cosyntex and labs are stable.  Hypertension, unspecified type  Age-related osteoporosis without current pathological fracture  Trochanteric bursitis, left hip  Sacroiliitis, not elsewhere classified ([place])  Chronic pain syndrome    Orders: Orders Placed This Encounter  Procedures  . XR Thoracic Spine 2 View   No orders of the defined types were placed in this encounter.   Face-to-face time spent with patient was 25 minutes. 50% of time was spent in counseling and coordination of care.  Follow-Up Instructions: Return for psoriatic arthritis.   Bo Merino, MD

## 2016-02-15 NOTE — Telephone Encounter (Signed)
Patient is having very bad pain in her mid back. Patient states it is a new pain and does not know if it's coming from her SI or not. Patient states the pain is very bad and hurts worse to take deep breaths, patient thinks it is location of the pain. I advised her to go to the ER if she is having trouble breathing but she states she isn't short of breath, it is the location of the pain causing breathing to be more labored and difficult. Please call patient about symptoms.

## 2016-02-15 NOTE — Telephone Encounter (Signed)
I gave her the Rx while she was here, and advised her UDS due next month

## 2016-02-15 NOTE — Telephone Encounter (Signed)
12/30/15 narcotic agreement 09/2015 UDS Advised patient due in Dec, order placed Ok to refill Hydrocodone ?

## 2016-02-20 ENCOUNTER — Telehealth: Payer: Self-pay | Admitting: Radiology

## 2016-02-20 DIAGNOSIS — Z9225 Personal history of immunosupression therapy: Secondary | ICD-10-CM

## 2016-02-20 NOTE — Telephone Encounter (Signed)
Refill request received via fax for  Cosentyx [facility]

## 2016-02-21 NOTE — Telephone Encounter (Signed)
02/15/16 last visit Next visit 05/21/16 12/06/15 labs WNL TB neg 05/11/15 Ok to refill per Dr Estanislado Pandy

## 2016-02-28 ENCOUNTER — Other Ambulatory Visit: Payer: Self-pay | Admitting: Radiology

## 2016-02-28 NOTE — Telephone Encounter (Signed)
[facility] has sent a request for cosentyx

## 2016-02-29 ENCOUNTER — Encounter: Payer: Self-pay | Admitting: Nurse Practitioner

## 2016-02-29 ENCOUNTER — Ambulatory Visit (INDEPENDENT_AMBULATORY_CARE_PROVIDER_SITE_OTHER): Payer: Medicare Other | Admitting: Nurse Practitioner

## 2016-02-29 DIAGNOSIS — R109 Unspecified abdominal pain: Secondary | ICD-10-CM | POA: Insufficient documentation

## 2016-02-29 DIAGNOSIS — R197 Diarrhea, unspecified: Secondary | ICD-10-CM | POA: Insufficient documentation

## 2016-02-29 DIAGNOSIS — A09 Infectious gastroenteritis and colitis, unspecified: Secondary | ICD-10-CM | POA: Diagnosis not present

## 2016-02-29 DIAGNOSIS — R1084 Generalized abdominal pain: Secondary | ICD-10-CM

## 2016-02-29 MED ORDER — COSENTYX SENSOREADY (300 MG) 150 MG/ML ~~LOC~~ SOAJ: 2.0000 "pen " | SUBCUTANEOUS | 0 refills | Status: DC

## 2016-02-29 NOTE — Patient Instructions (Signed)
1. Keep taking a daily probiotic. 2. Take Imodium as needed for diarrhea. 3. Return for follow-up in 4 weeks and we can consider colonoscopy at that time if you are still having symptoms. 4. Call us if you have any worsening or severe symptoms before then.

## 2016-02-29 NOTE — Progress Notes (Signed)
Referring Provider: [name], MD Primary Care Physician:  [name], MD Primary GI:  Dr. Oneida Alar  Chief Complaint  Patient presents with  . other    change in bowel habits    HPI:   Tonya Hall is a 65 y.o. female who presents for evaluation of three-week change in bowel habits. The patient was last seen in our office 08/31/2015 for GERD. The patient has a remote history of microscopic colitis in 2012, chronic history of GERD, nonspecific GI motility disorder. She was doing well on Prilosec daily and Zantac in the evening, recommended 2 year follow-up or sooner if needed.  Today she states she's doing ok overall. 3 weeks ago she had a change in bowel habits with 4-6 watery bowel movements a day initially which became sludgy after 3 days. This has improved to two bowel movents a day which are still sludgy and loose toward the end. Has had some minor cramping after bowel movement. Denies N/V, hematochezia, melena. Took Immodium last week which helped somewhat. Feels like it started on the first day she was in [place], [place]. No now diet changes. Has been around her sister who has been having RLQ pain and vomiting. Did have an episode of significant heartburn after thanksgiving and none since. Denies chest pain, dyspnea, dizziness, lightheadedness, syncope, near syncope. Denies any other upper or lower GI symptoms.  No recent antibiotics.  States this feels somewhat like when she had microscopic colitis (flex sig), except she isn't having the urgency she had in 2012. Previous colonoscopy 2010 here. Recommended repeat colonoscopy in 10 years (2020).   Past Medical History:  Diagnosis Date  . Anemia    at times  . Asthma    Albuterol in haler prn  . Cataract    immature unsure which eye  . Chronic back pain   . Degenerative disk disease   . Diabetes mellitus without complication ([place])   . Esophageal motility disorder    Non-specific, see modified barium study/speech path, BP  .  GERD (gastroesophageal reflux disease)    takes Protonix bid  . History of blood transfusion    did have a rash after receiving the blood  . History of blood transfusion    post c-section  . History of bronchitis    last time early 2014  . HTN (hypertension)    takes Metoprolol daily and SPironolactone daily  . Hypercholesterolemia   . Hyperlipidemia    takes Pravastatin daily  . Hypokalemia   . Lymphocytic colitis 05/26/10 TCS RMR   Responded to Entocort x 3 MOS  . Monilia infection 12/93  . Neuropathy ([place])   . Osteoporosis    gets Boniva every 3 months  . Peripheral edema    takes HCTZ daily  . PONV (postoperative nausea and vomiting)   . Psoriatic arthritis ([place])    Dr. Katherina Right  . Raynaud's disease   . Seasonal allergies   . Shortness of breath    with exertion  . SUI (stress urinary incontinence, female)   . Urinary urgency     Past Surgical History:  Procedure Laterality Date  . CESAREAN SECTION  1974, 1978      . COLONOSCOPY  06/19/2008   [id number] internal hemorrhoids/mild sigmoin colon diverticulosis  . COLONOSCOPY  2012   Dr. Gala Romney: normal rectum, diverticula, lymphocytic colitis   . DILATION AND CURETTAGE OF UTERUS  1977   spontaneous abortion  . ESOPHAGOGASTRODUODENOSCOPY  06/19/2008   [id number] gastritis  . LUMBAR  [place] ARTHROPLASTY  7/98  . LUMBAR FUSION  6/03, 11/08, 11/14   L4-5 fusion, L2-3 fusion, L1-2  . TONSILLECTOMY AND ADENOIDECTOMY      Current Outpatient Prescriptions  Medication Sig Dispense Refill  . albuterol (PROVENTIL HFA;VENTOLIN HFA) 108 (90 BASE) MCG/ACT inhaler Inhale 1 puff into the lungs every 6 (six) hours as needed for wheezing or shortness of breath.    Marland Kitchen aspirin 81 MG tablet Take 81 mg by mouth daily.      . calcium carbonate (OS-CAL) 600 MG TABS Take 600 mg by mouth 2 (two) times daily with a meal.     . COSENTYX SENSOREADY 300 DOSE 150 MG/ML SOAJ Inject 2 pens into the skin every 30 (thirty) days. Reported on 08/12/2015  6 pen 0  . cyclobenzaprine (FLEXERIL) 5 MG tablet One tid prn/ do not drive while using Flexeril 270 tablet 0  . diclofenac sodium (VOLTAREN) 1 % GEL   2  . DULoxetine (CYMBALTA) 60 MG capsule Take 1 capsule (60 mg total) by mouth at bedtime.    . fish oil-omega-3 fatty acids 1000 MG capsule Take 2 g by mouth 2 (two) times daily.     . Ginger, Zingiber officinalis, (GINGER ROOT) 500 MG CAPS Take 1 capsule by mouth daily.     . Glucosamine Sulfate 1000 MG CAPS Take 2 capsules (2,000 mg total) by mouth daily.    Marland Kitchen HYDROcodone-acetaminophen (NORCO/VICODIN) 5-325 MG tablet Take 1 tablet by mouth 2 (two) times daily as needed for moderate pain. 60 tablet 0  . leflunomide (ARAVA) 20 MG tablet Take 20 mg by mouth daily. 10mg  6 days/weekly    . metoprolol succinate (TOPROL-XL) 50 MG 24 hr tablet Take 1 tablet (50 mg total) by mouth daily. Take with or immediately following a meal.    . Misc Natural Products (TART CHERRY ADVANCED PO) Take by mouth. daily    . Multiple Vitamin (MULTIVITAMIN) tablet Take 1 tablet by mouth daily.      Marland Kitchen omeprazole (PRILOSEC) 20 MG capsule Take 1 capsule (20 mg total) by mouth daily. 90 capsule 3  . potassium chloride SA (K-DUR,KLOR-CON) 20 MEQ tablet Take 20 mEq by mouth 2 (two) times daily.    . pravastatin (PRAVACHOL) 40 MG tablet Take 40 mg by mouth daily.    . predniSONE (DELTASONE) 5 MG tablet Take 5 mg by mouth daily with breakfast.    . PROBIOTIC CAPS Take 1 capsule by mouth daily.    . ranitidine (ZANTAC) 75 MG tablet Take 1 tablet (75 mg total) by mouth at bedtime. Reported on 08/31/2015 90 tablet 3  . RESTASIS MULTIDOSE 0.05 % ophthalmic emulsion     . spironolactone (ALDACTONE) 25 MG tablet Take 25 mg by mouth daily.     Marland Kitchen topiramate (TOPAMAX) 200 MG tablet Take 1 tablet (200 mg total) by mouth at bedtime.    . Turmeric 500 MG CAPS Take 1 capsule by mouth daily.     . Zoledronic Acid (RECLAST IV) Inject into the vein.      No current facility-administered  medications for this visit.     Allergies as of 02/29/2016 - Review Complete 02/29/2016  Allergen Reaction Noted  . Adalimumab Rash and Other (See Comments)   . Imuran [azathioprine sodium] Rash 07/03/2011  . Methotrexate Other (See Comments)   . Plaquenil [hydroxychloroquine sulfate] Rash 07/03/2011  . Sulfonamide derivatives Rash   . Ceftin [cefuroxime axetil] Nausea Only 07/06/2014  . Other  04/28/2013  . Morphine Nausea  And Vomiting     Family History  Problem Relation Age of Onset  . Rheum arthritis Mother   . Diabetes Mother   . Pancreatitis Mother   . Rheumatic fever Father   . Diabetes      grandparents  . Skin cancer      grandfather  . Hypertension      grandparent  . Congestive Heart Failure      grandfather  . Parkinson's disease      grandmother  . Colon cancer Maternal Uncle   . Colon polyps Neg Hx     Social History   Social History  . Marital status: Married    Spouse name: N/A  . Number of children: N/A  . Years of education: N/A   Occupational History  . Case Mgr RN [employer]   Social History Main Topics  . Smoking status: Never Smoker  . Smokeless tobacco: Never Used  . Alcohol use No  . Drug use: No  . Sexual activity: Yes    Partners: Male    Birth control/ protection: Surgical     Comment: BTL   Other Topics Concern  . None   Social History Narrative  . None    Review of Systems: Complete ROS negative except as per HPI.   Physical Exam: BP 131/67   Pulse 86   Temp 97.9 F (36.6 C) (Oral)   Ht 4\' 11"  (1.499 m)   Wt 146 lb (66.2 kg)   LMP 02/01/1999   BMI 29.49 kg/m  General:   Alert and oriented. Pleasant and cooperative. Well-nourished and well-developed.  Eyes:  Without icterus, sclera clear and conjunctiva pink.  Ears:  Normal auditory acuity. Cardiovascular:  S1, S2 present without murmurs appreciated. Extremities without clubbing or edema. Respiratory:  Clear to auscultation bilaterally. No  wheezes, rales, or rhonchi. No distress.  Gastrointestinal:  +BS, rounded but soft, non-tender and non-distended. No HSM noted. No guarding or rebound. No masses appreciated.  Rectal:  Deferred  Musculoskalatal:  Symmetrical without gross deformities. Neurologic:  Alert and oriented x4;  grossly normal neurologically. Psych:  Alert and cooperative. Normal mood and affect. Heme/Lymph/Immune: No excessive bruising noted.    02/29/2016 3:58 PM   Disclaimer: This note was dictated with voice recognition software. Similar sounding words can inadvertently be transcribed and may not be corrected upon review.

## 2016-02-29 NOTE — Assessment & Plan Note (Signed)
She has some mild abdominal cramping after bowel movement. No overt pain. No nausea, vomiting. See diarrhea plan note for further workup and plan. Return for follow-up in 4 weeks.

## 2016-02-29 NOTE — Assessment & Plan Note (Signed)
The patient describes a new onset of what was initially watery diarrhea and progressed to "sludgy" diarrhea which started 3-3-1/2 weeks ago. She was having as many as 56 bowel movements a day but this is improved to about 2 bowel movements a day which are generally "sludgy" and occasionally some loose stool at the end. She is recently travel to [facility] and her symptoms started about the first day of her travel. No recent antibiotics, foreign travel, sick contacts, adventures foods, or medication changes. She likely has a gastroenteritis. This does not seem quite similar enough to her remote history of microscopic colitis. Her last colonoscopy was in 2010 and a flexible sigmoidoscopy in 2012. She is due for another colonoscopy in about 2-1/2 years. At this point I will try treatment for likely simple gastroenteritis. Continue taking Imodium, probiotic once a day. I will bring her back in 4 weeks and if she is still symptomatic then we can proceed with colonoscopy to further evaluate for other underlying etiology. She is to call us if she has any worsening or severe symptoms.

## 2016-03-01 ENCOUNTER — Telehealth (INDEPENDENT_AMBULATORY_CARE_PROVIDER_SITE_OTHER): Payer: Self-pay | Admitting: Rheumatology

## 2016-03-01 MED ORDER — COSENTYX SENSOREADY (300 MG) 150 MG/ML ~~LOC~~ SOAJ
2.0000 | SUBCUTANEOUS | 0 refills | Status: DC
Start: 2016-03-01 — End: 2016-05-21

## 2016-03-01 NOTE — Telephone Encounter (Signed)
Was sent yesterday resent today to correct pharmacy

## 2016-03-01 NOTE — Telephone Encounter (Signed)
[facility] called to request refill cosentyx for patient  Cb#: [phone] option 1

## 2016-03-01 NOTE — Progress Notes (Signed)
cc'ed to pcp °

## 2016-03-06 ENCOUNTER — Encounter (HOSPITAL_COMMUNITY)
Admission: RE | Admit: 2016-03-06 | Discharge: 2016-03-06 | Disposition: A | Discharge status: Home / Self Care | Payer: Medicare Other | Source: Ambulatory Visit | Attending: Rheumatology | Admitting: Rheumatology

## 2016-03-06 ENCOUNTER — Encounter: Payer: Self-pay | Admitting: Nurse Practitioner

## 2016-03-06 DIAGNOSIS — M818 Other osteoporosis without current pathological fracture: Secondary | ICD-10-CM | POA: Diagnosis not present

## 2016-03-06 MED ORDER — ZOLEDRONIC ACID 5 MG/100ML IV SOLN
5.0000 mg | Freq: Once | INTRAVENOUS | Status: AC
  Administered 2016-03-06: 5 mg via INTRAVENOUS

## 2016-03-06 MED ORDER — ZOLEDRONIC ACID 5 MG/100ML IV SOLN
INTRAVENOUS | Status: AC
  Filled 2016-03-06: qty 100

## 2016-03-06 MED ORDER — SODIUM CHLORIDE 0.9 % IV SOLN
INTRAVENOUS | Status: DC
  Administered 2016-03-06: 250 mL via INTRAVENOUS

## 2016-03-12 ENCOUNTER — Other Ambulatory Visit: Payer: Self-pay | Admitting: Rheumatology

## 2016-03-12 NOTE — Telephone Encounter (Signed)
Patient is requesting refill of hydrocodone.  

## 2016-03-13 ENCOUNTER — Telehealth: Payer: Self-pay

## 2016-03-13 DIAGNOSIS — R197 Diarrhea, unspecified: Secondary | ICD-10-CM

## 2016-03-13 MED ORDER — HYDROCODONE-ACETAMINOPHEN 5-325 MG PO TABS: 1.0000 | ORAL_TABLET | Freq: Two times a day (BID) | ORAL | 0 refills | Status: DC | PRN

## 2016-03-13 NOTE — Telephone Encounter (Signed)
Okay 

## 2016-03-13 NOTE — Telephone Encounter (Signed)
Pt is calling because she is still having the diarrhea 5-6 times a day. She is not feeling good now. She would like to be seen sooner the 04/11/16. Please advise

## 2016-03-13 NOTE — Telephone Encounter (Signed)
Left message for patient to call the office. Due to update UDS.   Last Visit: 02/15/16 Next Visit: 05/21/16 UDS: 09/12/15 Narc Agreement: 12/30/15  Okay to refill Hydrocodone?

## 2016-03-14 ENCOUNTER — Other Ambulatory Visit: Payer: Self-pay | Admitting: *Deleted

## 2016-03-14 DIAGNOSIS — Z5181 Encounter for therapeutic drug level monitoring: Secondary | ICD-10-CM | POA: Diagnosis not present

## 2016-03-14 DIAGNOSIS — A09 Infectious gastroenteritis and colitis, unspecified: Secondary | ICD-10-CM | POA: Diagnosis not present

## 2016-03-14 NOTE — Telephone Encounter (Signed)
Pt is aware to come by and pick up the stool kits/orders and to stay hydrated. Call if worsens or go to the ED.

## 2016-03-14 NOTE — Telephone Encounter (Signed)
Ok to work her in sooner, based on availability. In the meantime have her pick up stool cups and do CDiff and GI Path panel. Make sure she's staying well-hydrated. If symptoms become severe let us know and we may need to refer her to the ED.

## 2016-03-14 NOTE — Telephone Encounter (Signed)
I called pt and LMOM for a return call.  The stool sample kits and orders are at front for pick up.

## 2016-03-15 ENCOUNTER — Other Ambulatory Visit: Payer: Self-pay | Admitting: *Deleted

## 2016-03-15 DIAGNOSIS — Z23 Encounter for immunization: Secondary | ICD-10-CM | POA: Diagnosis not present

## 2016-03-15 MED ORDER — PREDNISONE 5 MG PO TABS: 5.0000 mg | ORAL_TABLET | Freq: Every day | ORAL | 0 refills | Status: DC

## 2016-03-15 NOTE — Telephone Encounter (Signed)
ok 

## 2016-03-15 NOTE — Telephone Encounter (Signed)
Refill request received via fax for Prednisone  Last Visit: 02/15/16 Next Visit: 05/21/16  Okay to refill Prednisone?

## 2016-03-15 NOTE — Telephone Encounter (Signed)
Her new appt is 03/21/2016 at 2:00 pm with Walden Field, NP.

## 2016-03-16 LAB — CLOSTRIDIUM DIFFICILE BY PCR: Toxigenic C. Difficile by PCR: NOT DETECTED

## 2016-03-17 LAB — PAIN MGMT, PROFILE 5 W/CONF, U
Amphetamines: NEGATIVE ng/mL (ref <500)
Barbiturates: NEGATIVE ng/mL (ref <300)
Benzodiazepines: NEGATIVE ng/mL (ref <100)
Cocaine Metabolite: NEGATIVE ng/mL (ref <150)
Codeine: NEGATIVE ng/mL (ref <50)
Creatinine: 86.6 mg/dL (ref >=20.0)
Hydrocodone: 990 ng/mL — ABNORMAL HIGH (ref <50)
Hydromorphone: 100 ng/mL — ABNORMAL HIGH (ref <50)
Marijuana Metabolite: NEGATIVE ng/mL (ref <20)
Methadone Metabolite: NEGATIVE ng/mL (ref <100)
Morphine: NEGATIVE ng/mL (ref <50)
Norhydrocodone: 1411 ng/mL — ABNORMAL HIGH (ref <50)
Opiates: POSITIVE ng/mL — AB (ref <100)
Oxidant: NEGATIVE ug/mL (ref <200)
Oxycodone: NEGATIVE ng/mL (ref <100)
pH: 6.42 (ref 4.5–9.0)

## 2016-03-19 LAB — GASTROINTESTINAL PATHOGEN PANEL PCR
C. difficile Tox A/B, PCR: NOT DETECTED
Campylobacter, PCR: NOT DETECTED
Cryptosporidium, PCR: NOT DETECTED
E coli (ETEC) LT/ST PCR: NOT DETECTED
E coli (STEC) stx1/stx2, PCR: NOT DETECTED
E coli 0157, PCR: NOT DETECTED
Giardia lamblia, PCR: NOT DETECTED
Norovirus, PCR: NOT DETECTED
Rotavirus A, PCR: NOT DETECTED
Salmonella, PCR: NOT DETECTED
Shigella, PCR: NOT DETECTED

## 2016-03-19 NOTE — Progress Notes (Signed)
C/w with tt

## 2016-03-20 ENCOUNTER — Other Ambulatory Visit: Payer: Self-pay | Admitting: Rheumatology

## 2016-03-20 DIAGNOSIS — M159 Polyosteoarthritis, unspecified: Secondary | ICD-10-CM

## 2016-03-20 NOTE — Telephone Encounter (Signed)
Last Visit: 12/08/15 Next visit: 05/21/16 Labs: 12/06/15 WNL Patient to go to lab today to update her labs.  Okay to refill Arava and Cymblata?

## 2016-03-21 ENCOUNTER — Other Ambulatory Visit: Payer: Self-pay

## 2016-03-21 ENCOUNTER — Ambulatory Visit (INDEPENDENT_AMBULATORY_CARE_PROVIDER_SITE_OTHER): Payer: Medicare Other | Admitting: Nurse Practitioner

## 2016-03-21 ENCOUNTER — Encounter: Payer: Self-pay | Admitting: Nurse Practitioner

## 2016-03-21 VITALS — BP 125/77 | HR 94 | Temp 98.3°F | Ht <= 58 in | Wt 144.6 lb

## 2016-03-21 DIAGNOSIS — R197 Diarrhea, unspecified: Secondary | ICD-10-CM

## 2016-03-21 DIAGNOSIS — A09 Infectious gastroenteritis and colitis, unspecified: Secondary | ICD-10-CM

## 2016-03-21 DIAGNOSIS — K219 Gastro-esophageal reflux disease without esophagitis: Secondary | ICD-10-CM | POA: Diagnosis not present

## 2016-03-21 MED ORDER — OMEPRAZOLE 20 MG PO CPDR: 20.0000 mg | DELAYED_RELEASE_CAPSULE | Freq: Two times a day (BID) | ORAL | 2 refills | Status: DC

## 2016-03-21 MED ORDER — SOD PICOSULFATE-MAG OX-CIT ACD 10-3.5-12 MG-GM-GM PO PACK: 1.0000 | PACK | ORAL | 0 refills | Status: DC

## 2016-03-21 MED ORDER — PEG 3350-KCL-NA BICARB-NACL 420 G PO SOLR: 4000.0000 mL | ORAL | 0 refills | Status: DC

## 2016-03-21 NOTE — Patient Instructions (Signed)
1. I changed her Prilosec prescription to include a second dose 30 minutes before dinner. 2. We will schedule your procedure for you. 3. Further recommendations to be made based on the results of your procedure. 4. Return for follow-up in 2 months.

## 2016-03-21 NOTE — Assessment & Plan Note (Signed)
The patient is having recurrence of GERD. She is having more frequent spells of indigestion which last for a couple days. She is also having a couple episodes in the past few months where she wakes in the middle the night with acid gastric contents in her throat and cause her to vomit. She is currently on Prilosec once a day. I will have her add a second dose of 20 mg Prilosec 30 minutes before dinner to see if this helps her majority evening symptoms. Return for follow-up in 2 months.

## 2016-03-21 NOTE — Assessment & Plan Note (Signed)
Noted worsening diarrhea. Loose to watery stools, up to 6 a day. GI pathogen panel and C. difficile are both negative. She has a history of microscopic colitis and she could be having a recurrence. It is been 8 years since her last colonoscopy and she is due within the next 2 years. At this point we'll proceed with colonoscopy for change in bowel habits to sudden onset diarrhea without identifiable source. Return for follow-up based on postprocedure recommendations or in 2 months.  Proceed with colonoscopy on propofol/MAC with Dr. Oneida Alar in the near future. The risks, benefits, and alternatives have been discussed in detail with the patient. They state understanding and desire to proceed.   The patient is currently on hydrocodone, Cymbalta, Flexeril. No other anticoagulants, anxiolytics, chronic pain medications, or antidepressants. We will plan for propofol/MAC to promote adequate sedation.

## 2016-03-21 NOTE — Progress Notes (Signed)
Referring Provider: [name], MD Primary Care Physician:  [name], MD Primary GI:  Dr. Oneida Alar  Chief Complaint  Patient presents with  . Diarrhea  . Gastroesophageal Reflux    more than usual  . Emesis    approx 3x past 6 months, wakes up at night with gastric juices in her mouth    HPI:   Tonya Hall is a 65 y.o. female who presents for follow-up on diarrhea. The patient was last seen in our office 02/29/2016 for the same. At that time she noted she was doing well overall, change in bowel habits 3 weeks prior 46 watery bowel movements a day which became "sludgy" after 3 days and improved to 2 bowel movements a day which still tended be loose toward the end. Some minor cramping, Imodium helped somewhat. Has been around her sister who has been having right lower quadrant pain and vomiting, recent trip to [facility]. She'll similar to when she had microscopic colitis but without urgency. Colonoscopy up-to-date 2010 recommended repeat in 2020. She was treated for presumed gastroenteritis, keep taking daily probiotic, Imodium as needed, return for follow-up in 4 weeks to consider colonoscopy if symptoms persist as she is do in a couple years anyway.  Today she states she's doing ok overall. Has had worsening GERD recently. Has had 2-3 episodes in the past few months where she wakes up in the middle of the night with "hot acid secretions in my mouth which causes me to vomit. Has also had more occurrences of prolonged indigestions as well. Cannot think of a common trigger. Generally eats dinner between 7-8 and goes to bed between 11 and 12. Also with worsening diarrhea again, back up to 6 stools a day, loose to watery. Has some minimal abdominal pain after the first stool, but generally resolves. Still taking probiotic and Imodium, not really helping. Denies hematochezia or melena. Has had some weight loss with excessive diarrhea about 5-6 lbs subjectively. Objectively has lost  2-3 lbs in the past 6 weeks, since last OV. Denies fevers, chills. Denies chest pain, dyspnea, dizziness, lightheadedness, syncope, near syncope. Denies any other upper or lower GI symptoms.  Has taken B-vitamin complex and read that this can cause some diarrhea, she started it about 10 days before diarrhea started. Was placed on it due to "sore tongue"  Past Medical History:  Diagnosis Date  . Anemia    at times  . Asthma    Albuterol in haler prn  . Cataract    immature unsure which eye  . Chronic back pain   . Degenerative disk disease   . Diabetes mellitus without complication ([place])   . Esophageal motility disorder    Non-specific, see modified barium study/speech path, BP  . GERD (gastroesophageal reflux disease)    takes Protonix bid  . History of blood transfusion    did have a rash after receiving the blood  . History of blood transfusion    post c-section  . History of bronchitis    last time early 2014  . HTN (hypertension)    takes Metoprolol daily and SPironolactone daily  . Hypercholesterolemia   . Hyperlipidemia    takes Pravastatin daily  . Hypokalemia   . Lymphocytic colitis 05/26/10 TCS RMR   Responded to Entocort x 3 MOS  . Monilia infection 12/93  . Neuropathy ([place])   . Osteoporosis    gets Boniva every 3 months  . Peripheral edema    takes HCTZ  daily  . PONV (postoperative nausea and vomiting)   . Psoriatic arthritis ([place])    Dr. Katherina Right  . Raynaud's disease   . Seasonal allergies   . Shortness of breath    with exertion  . SUI (stress urinary incontinence, female)   . Urinary urgency     Past Surgical History:  Procedure Laterality Date  . CESAREAN SECTION  1974, 1978      . COLONOSCOPY  06/19/2008   [id number] internal hemorrhoids/mild sigmoin colon diverticulosis  . COLONOSCOPY  2012   Dr. Gala Romney: normal rectum, diverticula, lymphocytic colitis   . DILATION AND CURETTAGE OF UTERUS  1977   spontaneous abortion  .  ESOPHAGOGASTRODUODENOSCOPY  06/19/2008   [id number] gastritis  . LUMBAR DISC ARTHROPLASTY  7/98  . LUMBAR FUSION  6/03, 11/08, 11/14   L4-5 fusion, L2-3 fusion, L1-2  . TONSILLECTOMY AND ADENOIDECTOMY      Current Outpatient Prescriptions  Medication Sig Dispense Refill  . albuterol (PROVENTIL HFA;VENTOLIN HFA) 108 (90 BASE) MCG/ACT inhaler Inhale 1 puff into the lungs every 6 (six) hours as needed for wheezing or shortness of breath.    Marland Kitchen aspirin 81 MG tablet Take 81 mg by mouth daily.      Marland Kitchen b complex vitamins capsule Take 1 capsule by mouth daily.    . calcium carbonate (OS-CAL) 600 MG TABS Take 600 mg by mouth 2 (two) times daily with a meal.     . COSENTYX SENSOREADY 300 DOSE 150 MG/ML SOAJ Inject 2 pens into the skin every 30 (thirty) days. Reported on 08/12/2015 6 pen 0  . cyclobenzaprine (FLEXERIL) 5 MG tablet One tid prn/ do not drive while using Flexeril 270 tablet 0  . diclofenac sodium (VOLTAREN) 1 % GEL as needed.   2  . DULoxetine (CYMBALTA) 60 MG capsule TAKE ONE CAPSULE BY MOUTH ONCE DAILY. 90 capsule 0  . fish oil-omega-3 fatty acids 1000 MG capsule Take 2 g by mouth 2 (two) times daily.     . Ginger, Zingiber officinalis, (GINGER ROOT) 500 MG CAPS Take 1 capsule by mouth daily.     . Glucosamine Sulfate 1000 MG CAPS Take 2 capsules (2,000 mg total) by mouth daily.    Marland Kitchen HYDROcodone-acetaminophen (NORCO/VICODIN) 5-325 MG tablet Take 1 tablet by mouth 2 (two) times daily as needed for moderate pain. 60 tablet 0  . leflunomide (ARAVA) 20 MG tablet TAKE 1 TABLET BY MOUTH DAILY. 90 tablet 0  . metoprolol succinate (TOPROL-XL) 50 MG 24 hr tablet Take 1 tablet (50 mg total) by mouth daily. Take with or immediately following a meal.    . Misc Natural Products (TART CHERRY ADVANCED PO) Take by mouth. daily    . Multiple Vitamin (MULTIVITAMIN) tablet Take 1 tablet by mouth daily.      Marland Kitchen omeprazole (PRILOSEC) 20 MG capsule Take 1 capsule (20 mg total) by mouth daily. 90 capsule 3  .  potassium chloride SA (K-DUR,KLOR-CON) 20 MEQ tablet Take 20 mEq by mouth 2 (two) times daily.    . pravastatin (PRAVACHOL) 40 MG tablet Take 40 mg by mouth daily.    . predniSONE (DELTASONE) 5 MG tablet Take 1 tablet (5 mg total) by mouth daily with breakfast. 90 tablet 0  . PROBIOTIC CAPS Take 1 capsule by mouth daily.    . ranitidine (ZANTAC) 75 MG tablet Take 1 tablet (75 mg total) by mouth at bedtime. Reported on 08/31/2015 90 tablet 3  . RESTASIS MULTIDOSE 0.05 % ophthalmic emulsion     .  spironolactone (ALDACTONE) 25 MG tablet Take 25 mg by mouth daily.     Marland Kitchen topiramate (TOPAMAX) 200 MG tablet Take 1 tablet (200 mg total) by mouth at bedtime.    . Turmeric 500 MG CAPS Take 1 capsule by mouth daily.     . Zoledronic Acid (RECLAST IV) Inject into the vein.      No current facility-administered medications for this visit.     Allergies as of 03/21/2016 - Review Complete 03/21/2016  Allergen Reaction Noted  . Adalimumab Rash and Other (See Comments)   . Imuran [azathioprine sodium] Rash 07/03/2011  . Methotrexate Other (See Comments)   . Plaquenil [hydroxychloroquine sulfate] Rash 07/03/2011  . Sulfonamide derivatives Rash   . Ceftin [cefuroxime axetil] Nausea Only 07/06/2014  . Other  04/28/2013  . Morphine Nausea And Vomiting     Family History  Problem Relation Age of Onset  . Rheum arthritis Mother   . Diabetes Mother   . Pancreatitis Mother   . Rheumatic fever Father   . Diabetes      grandparents  . Skin cancer      grandfather  . Hypertension      grandparent  . Congestive Heart Failure      grandfather  . Parkinson's disease      grandmother  . Colon cancer Maternal Uncle   . Colon polyps Neg Hx     Social History   Social History  . Marital status: Married    Spouse name: N/A  . Number of children: N/A  . Years of education: N/A   Occupational History  . Case Mgr RN [employer]   Social History Main Topics  . Smoking status: Never  Smoker  . Smokeless tobacco: Never Used  . Alcohol use No  . Drug use: No  . Sexual activity: Yes    Partners: Male    Birth control/ protection: Surgical     Comment: BTL   Other Topics Concern  . None   Social History Narrative  . None    Review of Systems: Complete ROS negative except as per HPI.   Physical Exam: BP 125/77   Pulse 94   Temp 98.3 F (36.8 C) (Oral)   Ht 4\' 10"  (1.473 m)   Wt 144 lb 9.6 oz (65.6 kg)   LMP 02/01/1999   BMI 30.22 kg/m  General:   Obese female. Alert and oriented. Pleasant and cooperative. Well-nourished and well-developed.  Head:  Normocephalic and atraumatic. Eyes:  Without icterus, sclera clear and conjunctiva pink.  Ears:  Normal auditory acuity. Cardiovascular:  S1, S2 present without murmurs appreciated. Extremities without clubbing or edema. Respiratory:  Clear to auscultation bilaterally. No wheezes, rales, or rhonchi. No distress.  Gastrointestinal:  +BS, rounded but soft, non-tender and non-distended. No HSM noted. No guarding or rebound. No masses appreciated.  Rectal:  Deferred  Musculoskalatal:  Symmetrical without gross deformities. Neurologic:  Alert and oriented x4;  grossly normal neurologically. Psych:  Alert and cooperative. Normal mood and affect. Heme/Lymph/Immune: No excessive bruising noted.    03/21/2016 2:48 PM   Disclaimer: This note was dictated with voice recognition software. Similar sounding words can inadvertently be transcribed and may not be corrected upon review.

## 2016-03-21 NOTE — Telephone Encounter (Signed)
Pt was seen in the office by Walden Field, NP today.

## 2016-03-22 NOTE — Progress Notes (Signed)
CC'D TO PCP °

## 2016-03-23 ENCOUNTER — Telehealth: Payer: Self-pay | Admitting: Rheumatology

## 2016-03-23 NOTE — Telephone Encounter (Signed)
Patient states she is taking Cosentyx. Patient states she has been taking it since June 2017. Patient states she has been having diarrhea for the last 6 weeks. Patient states she has been seeing a GI doctor and he has done stool studies that are negative. She is schedule for a coloscopy on 04/18/15. Patient states she has a history of microscopic colitis and that's what the GI doctor thinks it is. Patient wants to know if she should take her next scheduled dose of Cosentyx, which is due March 26, 2017.

## 2016-03-23 NOTE — Telephone Encounter (Signed)
Patient left a voicemail stating she has medication questions. Please call patient.

## 2016-03-23 NOTE — Telephone Encounter (Signed)
Patient called and stated she will call back on Wednesday

## 2016-03-23 NOTE — Telephone Encounter (Signed)
Left message for patient to hold cosentyx and to contact the office.

## 2016-03-23 NOTE — Telephone Encounter (Signed)
Please ask patient to hold Cosyntex until the colonoscopy. Sometimes some Cosyntex can induce IBD. She will need clearance from her GI doctor.

## 2016-03-24 NOTE — Progress Notes (Signed)
REVIEWED-NO ADDITIONAL RECOMMENDATIONS. 

## 2016-03-28 NOTE — Telephone Encounter (Signed)
Called pt to advise she understands.  

## 2016-04-04 ENCOUNTER — Ambulatory Visit: Payer: Medicare Other | Admitting: Nurse Practitioner

## 2016-04-05 DIAGNOSIS — Z6830 Body mass index (BMI) 30.0-30.9, adult: Secondary | ICD-10-CM | POA: Diagnosis not present

## 2016-04-05 DIAGNOSIS — R05 Cough: Secondary | ICD-10-CM | POA: Diagnosis not present

## 2016-04-10 NOTE — Patient Instructions (Signed)
NAIYA MORAIS  04/10/2016     @PREFPERIOPPHARMACY @   Your procedure is scheduled on  04/17/2016   Report to [facility] at  14  A.M.  Call this number if you have problems the morning of surgery:  [phone]   Remember:  Do not eat food or drink liquids after midnight.  Take these medicines the morning of surgery with A SIP OF WATER  Zyrtec, flexaril, cymbalta, hydrocodone, metoprolol, prilosec, topamax. Take your inhaler before you come and bring your rescue inhaler with you.   Do not wear jewelry, make-up or nail polish.  Do not wear lotions, powders, or perfumes, or deoderant.  Do not shave 48 hours prior to surgery.  Men may shave face and neck.  Do not bring valuables to the hospital.  [facility] is not responsible for any belongings or valuables.  Contacts, dentures or bridgework may not be worn into surgery.  Leave your suitcase in the car.  After surgery it may be brought to your room.  For patients admitted to the hospital, discharge time will be determined by your treatment team.  Patients discharged the day of surgery will not be allowed to drive home.   Name and phone number of your driver:   family Special instructions:  Follow the diet and prep instructions given to you by Dr Nona Dell office.  Please read over the following fact sheets that you were given. Anesthesia Post-op Instructions and Care and Recovery After Surgery       Colonoscopy, Adult A colonoscopy is an exam to look at the entire large intestine. During the exam, a lubricated, bendable tube is inserted into the anus and then passed into the rectum, colon, and other parts of the large intestine. A colonoscopy is often done as a part of normal colorectal screening or in response to certain symptoms, such as anemia, persistent diarrhea, abdominal pain, and blood in the stool. The exam can help screen for and diagnose medical problems,  including:  Tumors.  Polyps.  Inflammation.  Areas of bleeding. Tell a health care provider about:  Any allergies you have.  All medicines you are taking, including vitamins, herbs, eye drops, creams, and over-the-counter medicines.  Any problems you or family members have had with anesthetic medicines.  Any blood disorders you have.  Any surgeries you have had.  Any medical conditions you have.  Any problems you have had passing stool. What are the risks? Generally, this is a safe procedure. However, problems may occur, including:  Bleeding.  A tear in the intestine.  A reaction to medicines given during the exam.  Infection (rare). What happens before the procedure? Eating and drinking restrictions  Follow instructions from your health care provider about eating and drinking, which may include:  A few days before the procedure - follow a low-fiber diet. Avoid nuts, seeds, dried fruit, raw fruits, and vegetables.  1-3 days before the procedure - follow a clear liquid diet. Drink only clear liquids, such as clear broth or bouillon, black coffee or tea, clear juice, clear soft drinks or sports drinks, gelatin desert, and popsicles. Avoid any liquids that contain red or purple dye.  On the day of the procedure - do not eat or drink anything during the 2 hours before the procedure, or within the time period that your health care provider recommends. Bowel prep  If you were prescribed an oral bowel prep to clean out your colon:  Take it as told by your health care provider. Starting the day before your procedure, you will need to drink a large amount of medicated liquid. The liquid will cause you to have multiple loose stools until your stool is almost clear or light green.  If your skin or anus gets irritated from diarrhea, you may use these to relieve the irritation:  Medicated wipes, such as adult wet wipes with aloe and vitamin E.  A skin soothing-product like  petroleum jelly.  If you vomit while drinking the bowel prep, take a break for up to 60 minutes and then begin the bowel prep again. If vomiting continues and you cannot take the bowel prep without vomiting, call your health care provider. General instructions  Ask your health care provider about changing or stopping your regular medicines. This is especially important if you are taking diabetes medicines or blood thinners.  Plan to have someone take you home from the hospital or clinic. What happens during the procedure?  An IV tube may be inserted into one of your veins.  You will be given medicine to help you relax (sedative).  To reduce your risk of infection:  Your health care team will wash or sanitize their hands.  Your anal area will be washed with soap.  You will be asked to lie on your side with your knees bent.  Your health care provider will lubricate a long, thin, flexible tube. The tube will have a camera and a light on the end.  The tube will be inserted into your anus.  The tube will be gently eased through your rectum and colon.  Air will be delivered into your colon to keep it open. You may feel some pressure or cramping.  The camera will be used to take images during the procedure.  A small tissue sample may be removed from your body to be examined under a microscope (biopsy). If any potential problems are found, the tissue will be sent to a lab for testing.  If small polyps are found, your health care provider may remove them and have them checked for cancer cells.  The tube that was inserted into your anus will be slowly removed. The procedure may vary among health care providers and hospitals. What happens after the procedure?  Your blood pressure, heart rate, breathing rate, and blood oxygen level will be monitored until the medicines you were given have worn off.  Do not drive for 24 hours after the exam.  You may have a small amount of blood in  your stool.  You may pass gas and have mild abdominal cramping or bloating due to the air that was used to inflate your colon during the exam.  It is up to you to get the results of your procedure. Ask your health care provider, or the department performing the procedure, when your results will be ready. This information is not intended to replace advice given to you by your health care provider. Make sure you discuss any questions you have with your health care provider. Document Released: 03/16/2000 Document Revised: 10/07/2015 Document Reviewed: 05/31/2015 Elsevier Interactive Patient Education  2017 [employer]  Colonoscopy, Adult, Care After This sheet gives you information about how to care for yourself after your procedure. Your health care provider may also give you more specific instructions. If you have problems or questions, contact your health care provider. What can I expect after the procedure? After the procedure, it is common to have:  A  small amount of blood in your stool for 24 hours after the procedure.  Some gas.  Mild abdominal cramping or bloating. Follow these instructions at home: General instructions  For the first 24 hours after the procedure:  Do not drive or use machinery.  Do not sign important documents.  Do not drink alcohol.  Do your regular daily activities at a slower pace than normal.  Eat soft, easy-to-digest foods.  Rest often.  Take over-the-counter or prescription medicines only as told by your health care provider.  It is up to you to get the results of your procedure. Ask your health care provider, or the department performing the procedure, when your results will be ready. Relieving cramping and bloating  Try walking around when you have cramps or feel bloated.  Apply heat to your abdomen as told by your health care provider. Use a heat source that your health care provider recommends, such as a moist heat pack or a heating  pad.  Place a towel between your skin and the heat source.  Leave the heat on for 20-30 minutes.  Remove the heat if your skin turns bright red. This is especially important if you are unable to feel pain, heat, or cold. You may have a greater risk of getting burned. Eating and drinking  Drink enough fluid to keep your urine clear or pale yellow.  Resume your normal diet as instructed by your health care provider. Avoid heavy or fried foods that are hard to digest.  Avoid drinking alcohol for as long as instructed by your health care provider. Contact a health care provider if:  You have blood in your stool 2-3 days after the procedure. Get help right away if:  You have more than a small spotting of blood in your stool.  You pass large blood clots in your stool.  Your abdomen is swollen.  You have nausea or vomiting.  You have a fever.  You have increasing abdominal pain that is not relieved with medicine. This information is not intended to replace advice given to you by your health care provider. Make sure you discuss any questions you have with your health care provider. Document Released: 11/01/2003 Document Revised: 12/12/2015 Document Reviewed: 05/31/2015 Elsevier Interactive Patient Education  2017 [place] Anesthesia is a term that refers to techniques, procedures, and medicines that help a person stay safe and comfortable during a medical procedure. Monitored anesthesia care, or sedation, is one type of anesthesia. Your anesthesia specialist may recommend sedation if you will be having a procedure that does not require you to be unconscious, such as:  Cataract surgery.  A dental procedure.  A biopsy.  A colonoscopy. During the procedure, you may receive a medicine to help you relax (sedative). There are three levels of sedation:  Mild sedation. At this level, you may feel awake and relaxed. You will be able to follow  directions.  Moderate sedation. At this level, you will be sleepy. You may not remember the procedure.  Deep sedation. At this level, you will be asleep. You will not remember the procedure. The more medicine you are given, the deeper your level of sedation will be. Depending on how you respond to the procedure, the anesthesia specialist may change your level of sedation or the type of anesthesia to fit your needs. An anesthesia specialist will monitor you closely during the procedure. Let your health care provider know about:  Any allergies you have.  All medicines  you are taking, including vitamins, herbs, eye drops, creams, and over-the-counter medicines.  Any use of steroids (by mouth or as a cream).  Any problems you or family members have had with sedatives and anesthetic medicines.  Any blood disorders you have.  Any surgeries you have had.  Any medical conditions you have, such as sleep apnea.  Whether you are pregnant or may be pregnant.  Any use of cigarettes, alcohol, or street drugs. What are the risks? Generally, this is a safe procedure. However, problems may occur, including:  Getting too much medicine (oversedation).  Nausea.  Allergic reaction to medicines.  Trouble breathing. If this happens, a breathing tube may be used to help with breathing. It will be removed when you are awake and breathing on your own.  Heart trouble.  Lung trouble. Before the procedure Staying hydrated  Follow instructions from your health care provider about hydration, which may include:  Up to 2 hours before the procedure - you may continue to drink clear liquids, such as water, clear fruit juice, black coffee, and plain tea. Eating and drinking restrictions  Follow instructions from your health care provider about eating and drinking, which may include:  8 hours before the procedure - stop eating heavy meals or foods such as meat, fried foods, or fatty foods.  6 hours  before the procedure - stop eating light meals or foods, such as toast or cereal.  6 hours before the procedure - stop drinking milk or drinks that contain milk.  2 hours before the procedure - stop drinking clear liquids. Medicines  Ask your health care provider about:  Changing or stopping your regular medicines. This is especially important if you are taking diabetes medicines or blood thinners.  Taking medicines such as aspirin and ibuprofen. These medicines can thin your blood. Do not take these medicines before your procedure if your health care provider instructs you not to. Tests and exams  You will have a physical exam.  You may have blood tests done to show:  How well your kidneys and liver are working.  How well your blood can clot.  General instructions  Plan to have someone take you home from the hospital or clinic.  If you will be going home right after the procedure, plan to have someone with you for 24 hours. What happens during the procedure?  Your blood pressure, heart rate, breathing, level of pain and overall condition will be monitored.  An IV tube will be inserted into one of your veins.  Your anesthesia specialist will give you medicines as needed to keep you comfortable during the procedure. This may mean changing the level of sedation.  The procedure will be performed. After the procedure  Your blood pressure, heart rate, breathing rate, and blood oxygen level will be monitored until the medicines you were given have worn off.  Do not drive for 24 hours if you received a sedative.  You may:  Feel sleepy, clumsy, or nauseous.  Feel forgetful about what happened after the procedure.  Have a sore throat if you had a breathing tube during the procedure.  Vomit. This information is not intended to replace advice given to you by your health care provider. Make sure you discuss any questions you have with your health care provider. Document  Released: 12/13/2004 Document Revised: 08/26/2015 Document Reviewed: 07/10/2015 Elsevier Interactive Patient Education  2017 [place], Care After These instructions provide you with information about caring for yourself after  your procedure. Your health care provider may also give you more specific instructions. Your treatment has been planned according to current medical practices, but problems sometimes occur. Call your health care provider if you have any problems or questions after your procedure. What can I expect after the procedure? After your procedure, it is common to:  Feel sleepy for several hours.  Feel clumsy and have poor balance for several hours.  Feel forgetful about what happened after the procedure.  Have poor judgment for several hours.  Feel nauseous or vomit.  Have a sore throat if you had a breathing tube during the procedure. Follow these instructions at home: For at least 24 hours after the procedure:   Do not:  Participate in activities in which you could fall or become injured.  Drive.  Use heavy machinery.  Drink alcohol.  Take sleeping pills or medicines that cause drowsiness.  Make important decisions or sign legal documents.  Take care of children on your own.  Rest. Eating and drinking  Follow the diet that is recommended by your health care provider.  If you vomit, drink water, juice, or soup when you can drink without vomiting.  Make sure you have little or no nausea before eating solid foods. General instructions  Have a responsible adult stay with you until you are awake and alert.  Take over-the-counter and prescription medicines only as told by your health care provider.  If you smoke, do not smoke without supervision.  Keep all follow-up visits as told by your health care provider. This is important. Contact a health care provider if:  You keep feeling nauseous or you keep vomiting.  You  feel light-headed.  You develop a rash.  You have a fever. Get help right away if:  You have trouble breathing. This information is not intended to replace advice given to you by your health care provider. Make sure you discuss any questions you have with your health care provider. Document Released: 07/10/2015 Document Revised: 11/09/2015 Document Reviewed: 07/10/2015 Elsevier Interactive Patient Education  2017 [employer].

## 2016-04-11 ENCOUNTER — Ambulatory Visit: Payer: Medicare Other | Admitting: Nurse Practitioner

## 2016-04-12 ENCOUNTER — Encounter (HOSPITAL_COMMUNITY)
Admission: RE | Admit: 2016-04-12 | Discharge: 2016-04-12 | Disposition: A | Discharge status: Home / Self Care | Payer: Medicare Other | Source: Ambulatory Visit | Attending: Gastroenterology | Admitting: Gastroenterology

## 2016-04-12 ENCOUNTER — Encounter (HOSPITAL_COMMUNITY): Payer: Self-pay

## 2016-04-12 DIAGNOSIS — I498 Other specified cardiac arrhythmias: Secondary | ICD-10-CM | POA: Diagnosis not present

## 2016-04-12 DIAGNOSIS — Z01812 Encounter for preprocedural laboratory examination: Secondary | ICD-10-CM | POA: Diagnosis not present

## 2016-04-12 DIAGNOSIS — Z01818 Encounter for other preprocedural examination: Secondary | ICD-10-CM | POA: Diagnosis not present

## 2016-04-12 DIAGNOSIS — R197 Diarrhea, unspecified: Secondary | ICD-10-CM | POA: Insufficient documentation

## 2016-04-12 LAB — CBC WITH DIFFERENTIAL/PLATELET
Basophils Absolute: 0.1 10*3/uL (ref 0.0–0.1)
Basophils Relative: 1 %
Eosinophils Absolute: 0.7 10*3/uL (ref 0.0–0.7)
Eosinophils Relative: 6 %
HCT: 39.6 % (ref 36.0–46.0)
Hemoglobin: 13.1 g/dL (ref 12.0–15.0)
Lymphocytes Relative: 31 %
Lymphs Abs: 3.8 10*3/uL (ref 0.7–4.0)
MCH: 31.4 pg (ref 26.0–34.0)
MCHC: 33.1 g/dL (ref 30.0–36.0)
MCV: 95 fL (ref 78.0–100.0)
Monocytes Absolute: 1.1 10*3/uL — ABNORMAL HIGH (ref 0.1–1.0)
Monocytes Relative: 9 %
Neutro Abs: 6.8 10*3/uL (ref 1.7–7.7)
Neutrophils Relative %: 53 %
Platelets: 258 10*3/uL (ref 150–400)
RBC: 4.17 MIL/uL (ref 3.87–5.11)
RDW: 14 % (ref 11.5–15.5)
WBC: 12.4 10*3/uL — ABNORMAL HIGH (ref 4.0–10.5)

## 2016-04-12 LAB — BASIC METABOLIC PANEL
Anion gap: 7 (ref 5–15)
BUN: 26 mg/dL — ABNORMAL HIGH (ref 6–20)
CO2: 20 mmol/L — ABNORMAL LOW (ref 22–32)
Calcium: 9.1 mg/dL (ref 8.9–10.3)
Chloride: 105 mmol/L (ref 101–111)
Creatinine, Ser: 0.97 mg/dL (ref 0.44–1.00)
GFR calc Af Amer: >60 mL/min (ref >60)
GFR calc non Af Amer: 60 mL/min — ABNORMAL LOW (ref >60)
Glucose, Bld: 125 mg/dL — ABNORMAL HIGH (ref 65–99)
Potassium: 4 mmol/L (ref 3.5–5.1)
Sodium: 132 mmol/L — ABNORMAL LOW (ref 135–145)

## 2016-04-13 ENCOUNTER — Telehealth: Payer: Self-pay

## 2016-04-13 NOTE — Telephone Encounter (Signed)
Endo scheduler wanted pt to be notified to arrive at 7:45 am for TCS 04/17/16. LMOVM and LMOAM and informed pt.

## 2016-04-17 ENCOUNTER — Encounter (HOSPITAL_COMMUNITY): Payer: Self-pay

## 2016-04-17 ENCOUNTER — Encounter (HOSPITAL_COMMUNITY)
Admission: RE | Disposition: A | Discharge status: Home / Self Care | Payer: Self-pay | Source: Ambulatory Visit | Attending: Gastroenterology

## 2016-04-17 ENCOUNTER — Ambulatory Visit (HOSPITAL_COMMUNITY): Payer: Medicare Other | Admitting: Anesthesiology

## 2016-04-17 ENCOUNTER — Ambulatory Visit (HOSPITAL_COMMUNITY)
Admission: RE | Admit: 2016-04-17 | Discharge: 2016-04-17 | Disposition: A | Discharge status: Home / Self Care | Payer: Medicare Other | Source: Ambulatory Visit | Attending: Gastroenterology | Admitting: Gastroenterology

## 2016-04-17 DIAGNOSIS — Q438 Other specified congenital malformations of intestine: Secondary | ICD-10-CM | POA: Insufficient documentation

## 2016-04-17 DIAGNOSIS — M810 Age-related osteoporosis without current pathological fracture: Secondary | ICD-10-CM | POA: Diagnosis not present

## 2016-04-17 DIAGNOSIS — K644 Residual hemorrhoidal skin tags: Secondary | ICD-10-CM | POA: Insufficient documentation

## 2016-04-17 DIAGNOSIS — Z79899 Other long term (current) drug therapy: Secondary | ICD-10-CM | POA: Insufficient documentation

## 2016-04-17 DIAGNOSIS — E7800 Pure hypercholesterolemia, unspecified: Secondary | ICD-10-CM | POA: Insufficient documentation

## 2016-04-17 DIAGNOSIS — R197 Diarrhea, unspecified: Secondary | ICD-10-CM | POA: Insufficient documentation

## 2016-04-17 DIAGNOSIS — Z7982 Long term (current) use of aspirin: Secondary | ICD-10-CM | POA: Insufficient documentation

## 2016-04-17 DIAGNOSIS — E1151 Type 2 diabetes mellitus with diabetic peripheral angiopathy without gangrene: Secondary | ICD-10-CM | POA: Insufficient documentation

## 2016-04-17 DIAGNOSIS — R600 Localized edema: Secondary | ICD-10-CM | POA: Insufficient documentation

## 2016-04-17 DIAGNOSIS — K219 Gastro-esophageal reflux disease without esophagitis: Secondary | ICD-10-CM | POA: Insufficient documentation

## 2016-04-17 DIAGNOSIS — Z7952 Long term (current) use of systemic steroids: Secondary | ICD-10-CM | POA: Diagnosis not present

## 2016-04-17 DIAGNOSIS — E1140 Type 2 diabetes mellitus with diabetic neuropathy, unspecified: Secondary | ICD-10-CM | POA: Insufficient documentation

## 2016-04-17 DIAGNOSIS — I10 Essential (primary) hypertension: Secondary | ICD-10-CM | POA: Diagnosis not present

## 2016-04-17 DIAGNOSIS — J45909 Unspecified asthma, uncomplicated: Secondary | ICD-10-CM | POA: Diagnosis not present

## 2016-04-17 DIAGNOSIS — K648 Other hemorrhoids: Secondary | ICD-10-CM | POA: Insufficient documentation

## 2016-04-17 HISTORY — PX: COLONOSCOPY WITH PROPOFOL: SHX5780

## 2016-04-17 HISTORY — PX: BIOPSY: SHX5522

## 2016-04-17 LAB — GLUCOSE, CAPILLARY
Glucose-Capillary: 99 mg/dL (ref 65–99)
Glucose-Capillary: 109 mg/dL — ABNORMAL HIGH (ref 65–99)

## 2016-04-17 SURGERY — COLONOSCOPY WITH PROPOFOL
Anesthesia: Monitor Anesthesia Care

## 2016-04-17 MED ORDER — FENTANYL CITRATE (PF) 100 MCG/2ML IJ SOLN
INTRAMUSCULAR | Status: AC
  Filled 2016-04-17: qty 2

## 2016-04-17 MED ORDER — LACTASE 9000 UNITS PO TABS: ORAL_TABLET | ORAL | 11 refills | Status: DC

## 2016-04-17 MED ORDER — MIDAZOLAM HCL 2 MG/2ML IJ SOLN
0.5000 mg | INTRAMUSCULAR | Status: DC | PRN
Start: 2016-04-17 — End: 2016-04-17
  Administered 2016-04-17 (×2): 1 mg via INTRAVENOUS

## 2016-04-17 MED ORDER — LACTATED RINGERS IV SOLN
INTRAVENOUS | Status: DC
  Administered 2016-04-17: 09:00:00 via INTRAVENOUS

## 2016-04-17 MED ORDER — PROPOFOL 500 MG/50ML IV EMUL
INTRAVENOUS | Status: DC | PRN
  Administered 2016-04-17: 125 ug/kg/min via INTRAVENOUS
  Administered 2016-04-17: 10:00:00 via INTRAVENOUS

## 2016-04-17 MED ORDER — MIDAZOLAM HCL 2 MG/2ML IJ SOLN
INTRAMUSCULAR | Status: AC
  Filled 2016-04-17: qty 2

## 2016-04-17 MED ORDER — MIDAZOLAM HCL 5 MG/5ML IJ SOLN: INTRAMUSCULAR | Status: DC | PRN

## 2016-04-17 MED ORDER — FENTANYL CITRATE (PF) 100 MCG/2ML IJ SOLN
25.0000 ug | INTRAMUSCULAR | Status: AC | PRN
  Administered 2016-04-17 (×2): 25 ug via INTRAVENOUS

## 2016-04-17 MED ORDER — STERILE WATER FOR IRRIGATION IR SOLN
Status: DC | PRN
  Administered 2016-04-17: 100 mL

## 2016-04-17 MED ORDER — CHLORHEXIDINE GLUCONATE CLOTH 2 % EX PADS: 6.0000 | MEDICATED_PAD | Freq: Once | CUTANEOUS | Status: DC

## 2016-04-17 MED ORDER — PROPOFOL 10 MG/ML IV BOLUS
INTRAVENOUS | Status: AC
  Filled 2016-04-17: qty 20

## 2016-04-17 NOTE — Anesthesia Preprocedure Evaluation (Signed)
Anesthesia Evaluation  Patient identified by MRN, date of birth, ID band Patient awake    Reviewed: Allergy & Precautions, NPO status , Patient's Chart, lab work & pertinent test results  History of Anesthesia Complications (+) PONV and history of anesthetic complications  Airway Mallampati: II  TM Distance: >3 FB     Dental  (+) Teeth Intact, Implants   Pulmonary shortness of breath and with exertion, asthma ,    breath sounds clear to auscultation       Cardiovascular hypertension, + Peripheral Vascular Disease   Rhythm:Regular Rate:Normal     Neuro/Psych    GI/Hepatic GERD  ,  Endo/Other  diabetes, Type 2, Oral Hypoglycemic Agents  Renal/GU      Musculoskeletal  (+) Arthritis ,   Abdominal   Peds  Hematology  (+) anemia ,   Anesthesia Other Findings   Reproductive/Obstetrics                             Anesthesia Physical Anesthesia Plan  ASA: III  Anesthesia Plan: MAC   Post-op Pain Management:    Induction: Intravenous  Airway Management Planned: Simple Face Mask  Additional Equipment:   Intra-op Plan:   Post-operative Plan:   Informed Consent: I have reviewed the patients History and Physical, chart, labs and discussed the procedure including the risks, benefits and alternatives for the proposed anesthesia with the patient or authorized representative who has indicated his/her understanding and acceptance.     Plan Discussed with:   Anesthesia Plan Comments:         Anesthesia Quick Evaluation

## 2016-04-17 NOTE — Op Note (Signed)
[facility] Patient Name: Tonya Hall Procedure Date: 04/17/2016 9:10 AM MRN: [id number] Date of Birth: 12-14-1950 Attending MD: Barney Drain , MD CSN: [id number] Age: 66 Admit Type: Outpatient Procedure:                Colonoscopy WITH COLD FORCEPS BIOPSY Indications:              Clinically significant diarrhea of unexplained                            origin-PMHx: MICROSCOPIC COLITIS IN 2012 Providers:                Barney Drain, MD, Rosina Lowenstein, RN, Aram Candela Referring MD:             [name] Medicines:                Propofol per Anesthesia Complications:            No immediate complications. Estimated Blood Loss:     Estimated blood loss was minimal. Procedure:                Pre-Anesthesia Assessment:                           - Prior to the procedure, a History and Physical                            was performed, and patient medications and                            allergies were reviewed. The patient's tolerance of                            previous anesthesia was also reviewed. The risks                            and benefits of the procedure and the sedation                            options and risks were discussed with the patient.                            All questions were answered, and informed consent                            was obtained. Prior Anticoagulants: The patient has                            taken aspirin. ASA Grade Assessment: II - A patient                            with mild systemic disease. After reviewing the                            risks and benefits, the patient was deemed in  satisfactory condition to undergo the procedure.                            After obtaining informed consent, the colonoscope                            was passed under direct vision. Throughout the                            procedure, the patient's blood pressure, pulse, and                            oxygen  saturations were monitored continuously. The                            EC-[id number] scope was introduced through                            the anus and advanced to the 10 cm into the ileum.                            The terminal ileum, ileocecal valve, appendiceal                            orifice, and rectum were photographed. The                            colonoscopy was somewhat difficult due to a                            tortuous colon. Successful completion of the                            procedure was aided by COLOWRAP. The patient                            tolerated the procedure well. The quality of the                            bowel preparation was excellent. Scope In: 9:47:17 AM Scope Out: 10:12:22 AM Scope Withdrawal Time: 0 hours 22 minutes 20 seconds  Total Procedure Duration: 0 hours 25 minutes 5 seconds  Findings:      The terminal ileum appeared normal.      The recto-sigmoid colon, sigmoid colon and descending colon were       moderately redundant. Biopsies for histology were taken with a cold       forceps from the cecum, ascending colon, transverse colon and descending       colon for evaluation of microscopic colitis.      Internal hemorrhoids were found during retroflexion. The hemorrhoids       were small.      External hemorrhoids were found. The hemorrhoids were small. Impression:               - The examined portion of the ileum was normal.                           -  Redundant colon.                           - Internal hemorrhoids.                           - External hemorrhoids. Moderate Sedation:      Per Anesthesia Care Recommendation:           - Lactose free diet.                           - Continue present medications.                           - Await pathology results. IF NO MICROSCOPIC                            COLITIS AND DIARRHEA NOT IMPEOVED AFTER 1 MONTH,                            THEN WILL TREAT EMPIRICALLY FOR SIBO.                            - Repeat colonoscopy in 10 years for surveillance.                           - Return to GI office in 3 months.                           - Patient has a contact number available for                            emergencies. The signs and symptoms of potential                            delayed complications were discussed with the                            patient. Return to normal activities tomorrow.                            Written discharge instructions were provided to the                            patient. Procedure Code(s):        --- Professional ---                           [phone], Colonoscopy, flexible; with biopsy, single                            or multiple Diagnosis Code(s):        --- Professional ---                           K64.4, Residual hemorrhoidal skin tags  K64.8, Other hemorrhoids                           R19.7, Diarrhea, unspecified                           Q43.8, Other specified congenital malformations of                            intestine CPT copyright 2016 [employer]. All rights reserved. The codes documented in this report are preliminary and upon coder review may  be revised to meet current compliance requirements. Barney Drain, MD Barney Drain, MD 04/17/2016 10:25:58 AM This report has been signed electronically. Number of Addenda: 0

## 2016-04-17 NOTE — H&P (Signed)
Primary Care Physician:  [name], MD Primary Gastroenterologist:  Dr. Oneida Alar  Pre-Procedure History & Physical: HPI:  Tonya Hall is a 66 y.o. female here for  DIARRHEA-LAST TCS 2012-MICROSCOPIC COLITIS.  Past Medical History:  Diagnosis Date  . Anemia    at times  . Asthma    Albuterol in haler prn  . Cataract    immature unsure which eye  . Chronic back pain   . Degenerative disk disease    psoriatic  . Diabetes mellitus without complication (HCC)    diet controlled  . Esophageal motility disorder    Non-specific, see modified barium study/speech path, BP  . GERD (gastroesophageal reflux disease)    takes Protonix bid  . History of blood transfusion    did have a rash after receiving the blood  . History of blood transfusion    post c-section  . History of bronchitis    last time early 2014  . HTN (hypertension)    takes Metoprolol daily and SPironolactone daily  . Hypercholesterolemia   . Hyperlipidemia    takes Pravastatin daily  . Hypokalemia   . Lymphocytic colitis 05/26/10 TCS RMR   Responded to Entocort x 3 MOS  . Monilia infection 12/93  . Neuropathy ([place])   . Osteoporosis    gets Boniva every 3 months  . Peripheral edema    takes HCTZ daily  . PONV (postoperative nausea and vomiting)   . Psoriatic arthritis ([place])    Dr. Katherina Right  . Raynaud's disease   . Seasonal allergies   . Shortness of breath    with exertion  . SUI (stress urinary incontinence, female)   . Urinary urgency     Past Surgical History:  Procedure Laterality Date  . CESAREAN SECTION  1974, 1978      . COLONOSCOPY  06/19/2008   [id number] internal hemorrhoids/mild sigmoin colon diverticulosis  . COLONOSCOPY  2012   Dr. Gala Romney: normal rectum, diverticula, lymphocytic colitis   . DILATION AND CURETTAGE OF UTERUS  1977   spontaneous abortion  . ESOPHAGOGASTRODUODENOSCOPY  06/19/2008   [id number] gastritis  . LUMBAR DISC ARTHROPLASTY  7/98  . LUMBAR FUSION  6/03, 11/08,  11/14   L4-5 fusion, L2-3 fusion, L1-2  . TONSILLECTOMY AND ADENOIDECTOMY    . TUBAL LIGATION      Prior to Admission medications   Medication Sig Start Date End Date Taking? Authorizing Provider  albuterol (PROVENTIL HFA;VENTOLIN HFA) 108 (90 BASE) MCG/ACT inhaler Inhale 2 puffs into the lungs every 6 (six) hours as needed for wheezing or shortness of breath.    Yes Historical Provider, MD  aspirin 81 MG tablet Take 81 mg by mouth daily.     Yes Historical Provider, MD  b complex vitamins capsule Take 1 capsule by mouth daily.   Yes Historical Provider, MD  Calcium Carb-Cholecalciferol (CALCIUM 600 + D PO) Take 1 tablet by mouth 2 (two) times daily.   Yes Historical Provider, MD  cetirizine (ZYRTEC) 10 MG tablet Take 10 mg by mouth daily as needed for allergies.   Yes Historical Provider, MD  cyclobenzaprine (FLEXERIL) 5 MG tablet One tid prn/ do not drive while using Flexeril Patient taking differently: Take 5-10 mg by mouth 2 (two) times daily. Take 10mg s in the morning and 5mg s at bedtime 01/23/16  Yes Bo Merino, MD  diclofenac sodium (VOLTAREN) 1 % GEL Apply 2 g topically 3 (three) times daily as needed (pain).  01/17/15  Yes Historical Provider, MD  DULoxetine (CYMBALTA)  60 MG capsule TAKE ONE CAPSULE BY MOUTH ONCE DAILY. Patient taking differently: TAKE ONE CAPSULE BY MOUTH ONCE DAILY AT BEDTIME 03/20/16  Yes Naitik Panwala, PA-C  fish oil-omega-3 fatty acids 1000 MG capsule Take 2 g by mouth 2 (two) times daily.    Yes Historical Provider, MD  Ginger, Zingiber officinalis, (GINGER ROOT) 500 MG CAPS Take 1 capsule by mouth daily.    Yes Historical Provider, MD  Glucosamine Sulfate 1000 MG CAPS Take 2 capsules (2,000 mg total) by mouth daily. 07/03/11  Yes Zenia Resides, MD  guaiFENesin-dextromethorphan (ROBITUSSIN DM) 100-10 MG/5ML syrup Take 10 mLs by mouth every 6 (six) hours as needed for cough.   Yes Historical Provider, MD  HYDROcodone-acetaminophen (NORCO/VICODIN) 5-325 MG  tablet Take 1 tablet by mouth 2 (two) times daily as needed for moderate pain. 03/13/16  Yes Bo Merino, MD  hydrocortisone sodium succinate (SOLU-CORTEF) 100 MG SOLR injection Inject 100 mg into the vein. Take as needed when unable to swallow prednisone tablets   Yes Historical Provider, MD  Hypromellose (ARTIFICIAL TEARS OP) Apply 1 drop to eye daily as needed (dry eyes).   Yes Historical Provider, MD  leflunomide (ARAVA) 20 MG tablet TAKE 1 TABLET BY MOUTH DAILY. Patient taking differently: TAKE 1 TABLET BY MOUTH DAILY AT BEDTIME 03/20/16  Yes Naitik Panwala, PA-C  loperamide (IMODIUM A-D) 2 MG tablet Take 2-4 mg by mouth 2 (two) times daily as needed for diarrhea or loose stools.   Yes Historical Provider, MD  metoprolol succinate (TOPROL-XL) 50 MG 24 hr tablet Take 1 tablet (50 mg total) by mouth daily. Take with or immediately following a meal. 07/03/11  Yes Zenia Resides, MD  Misc Natural Products (TART CHERRY ADVANCED PO) Take 1 tablet by mouth daily. daily    Yes Historical Provider, MD  Multiple Vitamin (MULTIVITAMIN) tablet Take 1 tablet by mouth daily.     Yes Historical Provider, MD  omeprazole (PRILOSEC) 20 MG capsule Take 1 capsule (20 mg total) by mouth 2 (two) times daily before a meal. 03/21/16  Yes Carlis Stable, NP  phenylephrine (SUDAFED PE) 10 MG TABS tablet Take 10 mg by mouth 2 (two) times daily as needed (congestion).   Yes Historical Provider, MD  polyethylene glycol-electrolytes (TRILYTE) 420 g solution Take 4,000 mLs by mouth as directed. 03/21/16  Yes Danie Binder, MD  potassium chloride SA (K-DUR,KLOR-CON) 20 MEQ tablet Take 20 mEq by mouth 2 (two) times daily.   Yes Historical Provider, MD  pravastatin (PRAVACHOL) 40 MG tablet Take 40 mg by mouth every evening.    Yes Historical Provider, MD  predniSONE (DELTASONE) 5 MG tablet Take 1 tablet (5 mg total) by mouth daily with breakfast. Patient taking differently: Take 5 mg by mouth daily with breakfast. May take an  additional 5mg s as needed when feeling sick 03/15/16  Yes Bo Merino, MD  PROBIOTIC CAPS Take 1 capsule by mouth daily. 07/03/11  Yes Zenia Resides, MD  RESTASIS MULTIDOSE 0.05 % ophthalmic emulsion Place 1 drop into both eyes 2 (two) times daily.  12/27/15  Yes Historical Provider, MD  sodium chloride (OCEAN) 0.65 % SOLN nasal spray Place 1 spray into both nostrils as needed for congestion.   Yes Historical Provider, MD  spironolactone (ALDACTONE) 25 MG tablet Take 25 mg by mouth daily.    Yes Historical Provider, MD  topiramate (TOPAMAX) 200 MG tablet Take 1 tablet (200 mg total) by mouth at bedtime. 07/03/11  Yes Zenia Resides,  MD  Turmeric 500 MG CAPS Take 1 capsule by mouth daily.    Yes Historical Provider, MD  COSENTYX SENSOREADY 300 DOSE 150 MG/ML SOAJ Inject 2 pens into the skin every 30 (thirty) days. Reported on 08/12/2015 03/01/16   Bo Merino, MD  doxycycline (VIBRA-TABS) 100 MG tablet Take 100 mg by mouth 2 (two) times daily.    Historical Provider, MD  Sod Picosulfate-Mag Ox-Cit Acd 10-3.5-12 MG-GM-GM PACK Take 1 Container by mouth as directed. Patient not taking: Reported on 04/06/2016 03/21/16   Danie Binder, MD  Zoledronic Acid (RECLAST IV) Inject into the vein.     Historical Provider, MD    Allergies as of 03/21/2016 - Review Complete 03/21/2016  Allergen Reaction Noted  . Adalimumab Rash and Other (See Comments)   . Imuran [azathioprine sodium] Rash 07/03/2011  . Methotrexate Other (See Comments)   . Plaquenil [hydroxychloroquine sulfate] Rash 07/03/2011  . Sulfonamide derivatives Rash   . Ceftin [cefuroxime axetil] Nausea Only 07/06/2014  . Other  04/28/2013  . Morphine Nausea And Vomiting     Family History  Problem Relation Age of Onset  . Rheum arthritis Mother   . Diabetes Mother   . Pancreatitis Mother   . Rheumatic fever Father   . Diabetes      grandparents  . Skin cancer      grandfather  . Hypertension      grandparent  . Congestive  Heart Failure      grandfather  . Parkinson's disease      grandmother  . Colon cancer Maternal Uncle   . Colon polyps Neg Hx     Social History   Social History  . Marital status: Married    Spouse name: N/A  . Number of children: N/A  . Years of education: N/A   Occupational History  . Case Mgr RN [employer]   Social History Main Topics  . Smoking status: Never Smoker  . Smokeless tobacco: Never Used  . Alcohol use No  . Drug use: No  . Sexual activity: Yes    Partners: Male    Birth control/ protection: Surgical     Comment: BTL   Other Topics Concern  . Not on file   Social History Narrative  . No narrative on file    Review of Systems: See HPI, otherwise negative ROS   Physical Exam: BP 139/68   Pulse 87   Temp 98.2 F (36.8 C) (Oral)   Resp 12   Ht 4\' 10"  (1.473 m)   Wt 144 lb (65.3 kg)   LMP 02/01/1999   SpO2 99%   BMI 30.10 kg/m  General:   Alert,  pleasant and cooperative in NAD Head:  Normocephalic and atraumatic. Neck:  Supple; Lungs:  Clear throughout to auscultation.    Heart:  Regular rate and rhythm. Abdomen:  Soft, nontender and nondistended. Normal bowel sounds, without guarding, and without rebound.   Neurologic:  Alert and  oriented x4;  grossly normal neurologically.  Impression/Plan:     Diarrhea/BRBPR  PLAN: TCS TODAY WITH BIOPSY. DISCUSSED PROCEDURE, BENEFITS, & RISKS: < 1% chance of medication reaction, bleeding, perforation, or rupture of spleen/liver.

## 2016-04-17 NOTE — Discharge Instructions (Signed)
You have small internal hemorrhoids and diverticulosis IN YOUR LEFT COLON. THE LAST PART OF YOUR SMALL BOWEL IS NORMAL. I BIOPSIED YOUR COLON.   DRINK WATER TO KEEP YOUR URINE LIGHT YELLOW.  Follow a LACTOSE FREE DIET FOR 2 MOS. AVOID ITEMS THAT CAUSE BLOATING. See info below.  USE LACTASE 9000 UNIT TAB WITH MEALS THREE TIMES DAILY.  YOUR BIOPSY RESULTS WILL BE AVAILABLE IN MY CHART AFTER JAN 19 AND MY OFFICE WILL CONTACT YOU IN 10-14 DAYS WITH YOUR RESULTS.   FOLLOW UP IN 3 MOS.   Next colonoscopy in 10 years.  Colonoscopy Care After Read the instructions outlined below and refer to this sheet in the next week. These discharge instructions provide you with general information on caring for yourself after you leave the hospital. While your treatment has been planned according to the most current medical practices available, unavoidable complications occasionally occur. If you have any problems or questions after discharge, call DR. Najwa Spillane, [phone].  ACTIVITY  You may resume your regular activity, but move at a slower pace for the next 24 hours.   Take frequent rest periods for the next 24 hours.   Walking will help get rid of the air and reduce the bloated feeling in your belly (abdomen).   No driving for 24 hours (because of the medicine (anesthesia) used during the test).   You may shower.   Do not sign any important legal documents or operate any machinery for 24 hours (because of the anesthesia used during the test).    NUTRITION  Drink plenty of fluids.   You may resume your normal diet as instructed by your doctor.   Begin with a light meal and progress to your normal diet. Heavy or fried foods are harder to digest and may make you feel sick to your stomach (nauseated).   Avoid alcoholic beverages for 24 hours or as instructed.    MEDICATIONS  You may resume your normal medications.   WHAT YOU CAN EXPECT TODAY  Some feelings of bloating in the abdomen.     Passage of more gas than usual.   Spotting of blood in your stool or on the toilet paper  .  IF YOU HAD POLYPS REMOVED DURING THE COLONOSCOPY:  Eat a soft diet IF YOU HAVE NAUSEA, BLOATING, ABDOMINAL PAIN, OR VOMITING.    FINDING OUT THE RESULTS OF YOUR TEST Not all test results are available during your visit. DR. Oneida Alar WILL CALL YOU WITHIN 14 DAYS OF YOUR PROCEDUE WITH YOUR RESULTS. Do not assume everything is normal if you have not heard from DR. Johana Hopkinson, CALL HER OFFICE AT [phone].  SEEK IMMEDIATE MEDICAL ATTENTION AND CALL THE OFFICE: [phone] IF:  You have more than a spotting of blood in your stool.   Your belly is swollen (abdominal distention).   You are nauseated or vomiting.   You have a temperature over 101F.   You have abdominal pain or discomfort that is severe or gets worse throughout the day.  Diverticulosis Diverticulosis is a common condition that develops when small pouches (diverticula) form in the wall of the colon. The risk of diverticulosis increases with age. It happens more often in people who eat a low-fiber diet. Most individuals with diverticulosis have no symptoms. Those individuals with symptoms usually experience belly (abdominal) pain, constipation, or loose stools (diarrhea).  HOME CARE INSTRUCTIONS  Increase the amount of fiber in your diet as directed by your caregiver or dietician. This may reduce symptoms of diverticulosis.  Drink at least 6 to 8 glasses of water each day to prevent constipation.   Try not to strain when you have a bowel movement.  SEEK IMMEDIATE MEDICAL CARE IF:  You develop increasing pain or severe bloating.   You have an oral temperature above 101F.   You develop vomiting or bowel movements that are bloody or black.   Avoiding nuts and seeds to prevent complications is NOT NECESSARY.    FOODS HAVING HIGH FIBER CONTENT INCLUDE:  Fruits. Apple, peach, pear, tangerine, raisins, prunes.   Vegetables.  Brussels sprouts, asparagus, broccoli, cabbage, carrot, cauliflower, romaine lettuce, spinach, summer squash, tomato, winter squash, zucchini.   Starchy Vegetables. Baked beans, kidney beans, lima beans, split peas, lentils, potatoes (with skin).   Grains. Whole wheat bread, brown rice, bran flake cereal, plain oatmeal, white rice, shredded wheat, bran muffins.    Lactose Free Diet Lactose is a carbohydrate that is found mainly in milk and milk products, as well as in foods with added milk or whey. Lactose must be digested by the enzyme in order to be used by the body. Lactose intolerance occurs when there is a shortage of lactase. When your body is not able to digest lactose, you may feel sick to your stomach (nausea), bloating, cramping, gas and diarrhea.  There are many dairy products that may be tolerated better than milk by some people:  The use of cultured dairy products such as yogurt, buttermilk, cottage cheese, and sweet acidophilus milk (Kefir) for lactase-deficient individuals is usually well tolerated. This is because the healthy bacteria help digest lactose.   Lactose-hydrolyzed milk (Lactaid) contains 40-90% less lactose than milk and may also be well tolerated.   SPECIAL NOTES  Lactose is a carbohydrates. The major food source is dairy products. Reading food labels is important. Many products contain lactose even when they are not made from milk. Look for the following words: whey, milk solids, dry milk solids, nonfat dry milk powder. Typical sources of lactose other than dairy products include breads, candies, cold cuts, prepared and processed foods, and commercial sauces and gravies.   All foods must be prepared without milk, cream, or other dairy foods.   Soy milk and lactose-free supplements (LACTASE) may be used as an alternative to milk.   FOOD GROUP ALLOWED/RECOMMENDED AVOID/USE SPARINGLY  BREADS / STARCHES 4 servings or more* Breads and rolls made without milk.  [place], [place], or [place] bread. Breads and rolls that contain milk. Prepared mixes such as muffins, biscuits, waffles, pancakes. Sweet rolls, donuts, [place] toast (if made with milk or lactose).  Crackers: Soda crackers, graham crackers. Any crackers prepared without lactose. Zwieback crackers, corn curls, or any that contain lactose.  Cereals: Cooked or dry cereals prepared without lactose (read labels). Cooked or dry cereals prepared with lactose (read labels). Total, Cocoa Krispies. Special K.  Potatoes / Pasta / Rice: Any prepared without milk or lactose. Popcorn. Instant potatoes, frozen [place] fries, scalloped or au gratin potatoes.  VEGETABLES 2 servings or more Fresh, frozen, and canned vegetables. Creamed or breaded vegetables. Vegetables in a cheese sauce or with lactose-containing margarines.  FRUIT 2 servings or more All fresh, canned, or frozen fruits that are not processed with lactose. Any canned or frozen fruits processed with lactose.  MEAT & SUBSTITUTES 2 servings or more (4 to 6 oz. total per day) Plain beef, chicken, fish, [place], lamb, veal, pork, or ham. Kosher prepared meat products. Strained or junior meats that do not contain milk. Eggs, soy meat  substitutes, nuts. Scrambled eggs, omelets, and souffles that contain milk. Creamed or breaded meat, fish, or fowl. Sausage products such as wieners, liver sausage, or cold cuts that contain milk solids. Cheese, cottage cheese, or cheese spreads.  MILK None. (See BEVERAGES for milk substitutes. See DESSERTS for ice cream and frozen desserts.) Milk (whole, 2%, skim, or chocolate). Evaporated, powdered, or condensed milk; malted milk.  SOUPS & COMBINATION FOODS Bouillon, broth, vegetable soups, clear soups, consomms. Homemade soups made with allowed ingredients. Combination or prepared foods that do not contain milk or milk products (read labels). Cream soups, chowders, commercially prepared soups containing lactose. Macaroni and  cheese, pizza. Combination or prepared foods that contain milk or milk products.  DESSERTS & SWEETS In moderation Water and fruit ices; gelatin; angel food cake. Homemade cookies, pies, or cakes made from allowed ingredients. Pudding (if made with water or a milk substitute). Lactose-free tofu desserts. Sugar, honey, corn syrup, jam, jelly; marmalade; molasses (beet sugar); Pure sugar candy; marshmallows. Ice cream, ice milk, sherbet, custard, pudding, frozen yogurt. Commercial cake and cookie mixes. Desserts that contain chocolate. Pie crust made with milk-containing margarine; reduced-calorie desserts made with a sugar substitute that contains lactose. Toffee, peppermint, butterscotch, chocolate, caramels.  FATS & OILS In moderation Butter (as tolerated; contains very small amounts of lactose). Margarines and dressings that do not contain milk, Vegetable oils, shortening, Miracle Whip, mayonnaise, nondairy cream & whipped toppings without lactose or milk solids added (examples: Coffee Rich, Carnation Coffeemate, Rich's Whipped Topping, PolyRich). Berniece Salines. Margarines and salad dressings containing milk; cream, cream cheese; peanut butter with added milk solids, sour cream, chip dips, made with sour cream.  BEVERAGES Carbonated drinks; tea; coffee and freeze-dried coffee; some instant coffees (check labels). Fruit drinks; fruit and vegetable juice; Rice or Soy milk. Ovaltine, hot chocolate. Some cocoas; some instant coffees; instant iced teas; powdered fruit drinks (read labels).   CONDIMENTS / MISCELLANEOUS Soy sauce, carob powder, olives, gravy made with water, baker's cocoa, pickles, pure seasonings and spices, wine, pure monosodium glutamate, catsup, mustard. Some chewing gums, chocolate, some cocoas. Certain antibiotics and vitamin / mineral preparations. Spice blends if they contain milk products. MSG extender. Artificial sweeteners that contain lactose such as Equal (Nutra-Sweet) and Sweet 'n  Low. Some nondairy creamers (read labels).   SAMPLE MENU*  Breakfast   Orange Juice.  Banana.   Bran flakes.   Nondairy Creamer.  [place] Bread (toasted).   Butter or milk-free margarine.   Coffee or tea.    Noon Meal   Chicken Breast.  Rice.   Green beans.   Butter or milk-free margarine.  Fresh melon.   Coffee or tea.    Evening Meal   Roast Beef.  Baked potato.   Butter or milk-free margarine.   Broccoli.   Lettuce salad with vinegar and oil dressing.  [employer]   Coffee or tea.

## 2016-04-17 NOTE — Transfer of Care (Signed)
Immediate Anesthesia Transfer of Care Note  Patient: Tonya Hall  Procedure(s) Performed: Procedure(s) with comments: COLONOSCOPY WITH PROPOFOL (N/A) - 900 BIOPSY - random colon bx's  Patient Location: PACU  Anesthesia Type:MAC  Level of Consciousness: awake, alert  and patient cooperative  Airway & Oxygen Therapy: Patient Spontanous Breathing and Patient connected to face mask oxygen  Post-op Assessment: Report given to RN, Post -op Vital signs reviewed and stable and Patient moving all extremities  Post vital signs: Reviewed and stable  Last Vitals:  Vitals:   04/17/16 0925 04/17/16 0930  BP: 128/69 (!) 70/50  Pulse:    Resp: 13 13  Temp:      Last Pain:  Vitals:   04/17/16 0749  TempSrc: Oral         Complications: No apparent anesthesia complications

## 2016-04-17 NOTE — Anesthesia Postprocedure Evaluation (Signed)
Anesthesia Post Note  Patient: Tonya Hall  Procedure(s) Performed: Procedure(s) (LRB): COLONOSCOPY WITH PROPOFOL (N/A) BIOPSY  Patient location during evaluation: PACU Anesthesia Type: MAC Level of consciousness: awake and alert, oriented and patient cooperative Pain management: pain level controlled Vital Signs Assessment: post-procedure vital signs reviewed and stable Respiratory status: spontaneous breathing, nonlabored ventilation and respiratory function stable Cardiovascular status: blood pressure returned to baseline Postop Assessment: no signs of nausea or vomiting Anesthetic complications: no     Last Vitals:  Vitals:   04/17/16 0930 04/17/16 1019  BP: (!) 70/50 (!) 100/50  Pulse:  77  Resp: 13 20  Temp:  36.7 C    Last Pain:  Vitals:   04/17/16 0749  TempSrc: Oral                 Jules Baty J

## 2016-04-19 ENCOUNTER — Encounter (HOSPITAL_COMMUNITY): Payer: Self-pay | Admitting: Gastroenterology

## 2016-04-22 ENCOUNTER — Encounter: Payer: Self-pay | Admitting: Gastroenterology

## 2016-04-22 ENCOUNTER — Telehealth: Payer: Self-pay | Admitting: Gastroenterology

## 2016-04-22 MED ORDER — DULOXETINE HCL 30 MG PO CPEP: ORAL_CAPSULE | ORAL | 0 refills | Status: DC

## 2016-04-22 MED ORDER — BUDESONIDE 3 MG PO CPEP
ORAL_CAPSULE | ORAL | 0 refills | Status: DC
Start: 2016-04-22 — End: 2016-07-11

## 2016-04-22 NOTE — Telephone Encounter (Signed)
Called patient TO DISCUSS RESULTS. LVM-CALL AT [phone] OR [phone] TO DISCUSS. PT RETURNED [phone] HAS COLLAGENOUS COLITIS(CC). MEDS ASSOCIATED WITH CC: ASA, NSAIDs, PPIs, AND DULOXETINE. PT HAS BEEN ON ASA/PPI/NSAIDS SINCE 2010. DEVELOPED CC IN 2012 AFTER STARTING DULOXETINE FOR LEG PAIN, BUT NOT HAVING NERVE PAIN ANYMORE SINCE SHE HAD BACK SURGERY. IN 2012 RX WITH ENTOCORT TAPER OVER 3 MOS AND SYMPTOMS RESOLVED. DIARRHEA RETURNED IN 2017. TCS JAN 2018 SHOWS CC. RECOMMEND TAPERING OFF CYMBLATA: 30 MG DAILY FOR 1 WEEK AND THEN QOD AND THEN STOP. PT FEELS LIKE SHE CAN DO WITHOUT CYMBALTA. IF NEEDED FOR NERVE PAIN THEN SHE COULD CONSIDER A TCA. CONFIRMED [facility] AS PHARMACY.  PT VOICED HER UNDERSTANDING. OPV IN FEB 2018 EG AND APR 2018 SLF. PLEASE CALL WITH QUESTIONS OR CONCERNS.

## 2016-04-23 ENCOUNTER — Other Ambulatory Visit: Payer: Self-pay | Admitting: Rheumatology

## 2016-04-23 NOTE — Telephone Encounter (Signed)
Patient is requesting refill of hydrocodone.   Patient would also like to speak to someone about going back on her medication since she had to come off of it for a little while when she had a colonoscopy. Please call patient.

## 2016-04-24 ENCOUNTER — Telehealth: Payer: Self-pay | Admitting: *Deleted

## 2016-04-24 MED ORDER — HYDROCODONE-ACETAMINOPHEN 5-325 MG PO TABS: 1.0000 | ORAL_TABLET | Freq: Two times a day (BID) | ORAL | 0 refills | Status: DC | PRN

## 2016-04-24 NOTE — Telephone Encounter (Signed)
ok 

## 2016-04-24 NOTE — Telephone Encounter (Signed)
Last Visit: 02/15/16 Next Visit: 05/21/16 UDS:03/14/16 Narc Agreement: 12/30/15  Okay to refill Hydrocodone?

## 2016-04-24 NOTE — Telephone Encounter (Signed)
Patient had labs 04/12/16.

## 2016-04-24 NOTE — Telephone Encounter (Signed)
Tonya Hall, just be sure her labs are uptodate and put in standing order if needed every 2 months.  I already spoke w/ patient and put info below as documentation. ===> Patient had a colonoscopy done for diarrhea. I reviewed the procedure note.  Pre-op diagnosis: diarrhea Post-op diagnosis: diverticulosis, normal ileum, hemorrhoids Based on the above,  patient to restart her COSYNTEX. I have advised pt she can restart.

## 2016-04-24 NOTE — Telephone Encounter (Signed)
Last Visit: 02/15/16 Next Visit: 05/21/16  Okay to refill Cyclobenzaprine and Topiramate?

## 2016-04-24 NOTE — Telephone Encounter (Signed)
Patient states she had the colonoscopy preformed for the diarrhea. Patient states the colonoscopy showed the it was inflammatory colitis. Patient states her GI is taking her off her Cymbalta. Patient was given Entocort 3mg  to take 3 capsules for 1 month, then 2 capsules for 1 month then 1 capsules for 1 month. Patient would like to know when she could go back on her Cosentyx. Patient has not had her Cosentyx since February 25, 2016 due to the diarrhea. Patient states hands and toes are hurting.

## 2016-04-24 NOTE — Telephone Encounter (Signed)
Please call patient to discuss if she has no history of inflammatory bowel disease then she can go back on Cosyntex.

## 2016-04-25 ENCOUNTER — Telehealth: Payer: Self-pay

## 2016-04-25 NOTE — Telephone Encounter (Signed)
PLEASE CALL PT. SHE DOES NOT HAVE TO STRICTLY FOLLOW A LACTOSE FREE DIET. SHE CAN DO WITHOUT THE LACTASE PILLS.

## 2016-04-25 NOTE — Telephone Encounter (Signed)
Pt is aware.  

## 2016-04-25 NOTE — Telephone Encounter (Signed)
Pt is wanting to know if she still needs to be on the Lacto free diet? She is also asking about the pills because she does not have any of them. She is having a hard time to do the Lacto free diet. Please advise

## 2016-04-26 ENCOUNTER — Telehealth: Payer: Self-pay | Admitting: Pharmacist

## 2016-04-26 ENCOUNTER — Telehealth: Payer: Self-pay | Admitting: *Deleted

## 2016-04-26 MED ORDER — TIZANIDINE HCL 4 MG PO TABS: 4.0000 mg | ORAL_TABLET | Freq: Every day | ORAL | 2 refills | Status: DC

## 2016-04-26 NOTE — Telephone Encounter (Signed)
Patient advised [facility] reached out to our office to advise her drug coverage formulary no longer cover cyclobenzaprine. Per Dr. Estanislado Pandy okay to changes patient prescription to Tizanidine 4 mg po q hs #30 with 2 refills. Prescription sent to pharmacy.

## 2016-04-26 NOTE — Telephone Encounter (Signed)
Received fax from [facility] [facility] [facility] regarding use of cyclobenzaprine use in seniors.  I noted that cyclobenzaprine was changed to tizanidine today.    I called patient to discuss.  I reviewed the purpose, proper use, and adverse effects of tizanidine.  I advised her to use the lowest dose possible and advised her to only use as needed.  Also advised patient not to drive after taking tizanidine.  Patient voiced understanding.  Elisabeth Most, Pharm.D., BCPS, CPP Clinical Pharmacist Pager: [phone] Phone: [phone] 04/26/2016 4:45 PM

## 2016-05-15 ENCOUNTER — Other Ambulatory Visit: Payer: Self-pay | Admitting: Obstetrics & Gynecology

## 2016-05-15 DIAGNOSIS — Z1231 Encounter for screening mammogram for malignant neoplasm of breast: Secondary | ICD-10-CM

## 2016-05-15 NOTE — Progress Notes (Signed)
Office Visit Note  Patient: Tonya Hall             Date of Birth: 08-14-1950           MRN: [id number]             PCP: [name], Hall Referring: [name], Hall Visit Date: 05/21/2016 Occupation: '@GUAROCC'$ @    Subjective:  Pain of the Left Hip And left SI joint. Patient requests cortisone injection in each of these.  History of Present Illness: Tonya Hall is a 66 y.o. female  Patient has a history of psoriasis and psoriatic arthritis. She's been doing well with the Cosentyx 300 mg every month. Unfortunately, she had a flare of diarrhea that lasted for about 3 month.  Patient is also having additional pain to the left knee we will not inject it today because I believe the pain to the left knee is actually coming from the left hip and the left bursa.  She had done well with the Cosentyx but because she had stopped the Cosentyx while she was having while the diarrhea, her hands a flare. Please see CDAI for full details  In addition to the Cosentyx every month, patient is also on Arava 20 mg daily And prednisone 5 mg daily. When all of these are taken on a regular basis, she is having adequate response.    Activities of Daily Living:  Patient reports morning stiffness for ALL DAY CURRENTLY hours.   Patient Reports nocturnal pain.  Difficulty dressing/grooming: Reports Difficulty climbing stairs: Reports Difficulty getting out of chair: Reports Difficulty using hands for taps, buttons, cutlery, and/or writing: Reports   Review of Systems  Constitutional: Negative for fatigue.  HENT: Negative for mouth sores and mouth dryness.   Eyes: Negative for dryness.  Respiratory: Negative for shortness of breath.   Gastrointestinal: Negative for constipation and diarrhea.  Musculoskeletal: Negative for myalgias and myalgias.  Skin: Negative for sensitivity to sunlight.  Psychiatric/Behavioral: Negative for decreased concentration and sleep disturbance.    PMFS History:    Patient Active Problem List   Diagnosis Date Noted  . Psoriasis 05/21/2016  . Diarrhea 02/29/2016  . Abdominal pain 02/29/2016  . High risk medication use 02/15/2016  . Age-related osteoporosis without current pathological fracture 02/15/2016  . Trochanteric bursitis, left hip 02/15/2016  . Sacroiliitis, not elsewhere classified ([employer]) 02/15/2016  . Chronic pain syndrome 02/15/2016  . Abnormal weight gain 06/25/2012  . Hypertension 07/03/2011  . Hypercholesteremia 07/03/2011  . Psoriatic arthritis ([place]) 07/03/2011  . Osteoarthritis of multiple joints 07/03/2011  . Esophageal motility disorder 02/14/2011  . Cough 02/14/2011  . Dysphagia 09/21/2010  . GERD 05/05/2010    Past Medical History:  Diagnosis Date  . Anemia    at times  . Asthma    Albuterol in haler prn  . Cataract    immature unsure which eye  . Chronic back pain   . Degenerative disk disease    psoriatic  . Diabetes mellitus without complication (HCC)    diet controlled  . Esophageal motility disorder    Non-specific, see modified barium study/speech path, BP  . GERD (gastroesophageal reflux disease)    takes Protonix bid  . History of blood transfusion    did have a rash after receiving the blood  . History of blood transfusion    post c-section  . History of bronchitis    last time early 2014  . HTN (hypertension)    takes Metoprolol daily and SPironolactone  daily  . Hypercholesterolemia   . Hyperlipidemia    takes Pravastatin daily  . Hypokalemia   . Lymphocytic colitis 05/26/10 TCS RMR   Responded to Entocort x 3 MOS  . Monilia infection 12/93  . Neuropathy ([place])   . Osteoporosis    gets Boniva every 3 months  . Peripheral edema    takes HCTZ daily  . PONV (postoperative nausea and vomiting)   . Psoriatic arthritis ([place])    Dr. Katherina Right  . Raynaud's disease   . Seasonal allergies   . Shortness of breath    with exertion  . SUI (stress urinary incontinence, female)   . Urinary urgency      Family History  Problem Relation Age of Onset  . Rheum arthritis Mother   . Diabetes Mother   . Pancreatitis Mother   . Rheumatic fever Father   . Diabetes      grandparents  . Skin cancer      grandfather  . Hypertension      grandparent  . Congestive Heart Failure      grandfather  . Parkinson's disease      grandmother  . Colon cancer Maternal Uncle   . Colon polyps Neg Hx    Past Surgical History:  Procedure Laterality Date  . BIOPSY  04/17/2016   Procedure: BIOPSY;  Surgeon: Tonya Hall;  [facility]: [facility];  Service: Endoscopy;;  random colon bx's  . CESAREAN SECTION  1974, 1978      . COLONOSCOPY  06/19/2008   BOF:BPZWC internal hemorrhoids/mild sigmoin colon diverticulosis  . COLONOSCOPY  2012   Dr. Gala Hall: normal rectum, diverticula, lymphocytic colitis   . COLONOSCOPY WITH PROPOFOL N/A 04/17/2016   Procedure: COLONOSCOPY WITH PROPOFOL;  Surgeon: Tonya Hall;  Location: [facility];  Service: Endoscopy;  Laterality: N/A;  900  . DILATION AND CURETTAGE OF UTERUS  1977   spontaneous abortion  . ESOPHAGOGASTRODUODENOSCOPY  06/19/2008   HEN:IDPOEUMP gastritis  . LUMBAR DISC ARTHROPLASTY  7/98  . LUMBAR FUSION  6/03, 11/08, 11/14   L4-5 fusion, L2-3 fusion, L1-2  . TONSILLECTOMY AND ADENOIDECTOMY    . TUBAL LIGATION     Social History   Social History Narrative  . No narrative on file     Objective: Vital Signs: BP 138/72   Pulse 70   Resp 14   Ht '4\' 10"'$  (1.473 m)   Wt 142 lb (64.4 kg)   LMP 02/01/1999   BMI 29.68 kg/m    Physical Exam  Constitutional: She is oriented to person, place, and time. She appears well-developed and well-nourished.  HENT:  Head: Normocephalic and atraumatic.  Eyes: EOM are normal. Pupils are equal, round, and reactive to light.  Cardiovascular: Normal rate, regular rhythm and normal heart sounds.  Exam reveals no gallop and no friction rub.   No murmur heard. Pulmonary/Chest: Effort normal and breath  sounds normal. She has no wheezes. She has no rales.  Abdominal: Soft. Bowel sounds are normal. She exhibits no distension. There is no tenderness. There is no guarding. No hernia.  Musculoskeletal: Normal range of motion. She exhibits no edema, tenderness or deformity.  Lymphadenopathy:    She has no cervical adenopathy.  Neurological: She is alert and oriented to person, place, and time. Coordination normal.  Skin: Skin is warm and dry. Capillary refill takes less than 2 seconds. No rash noted.  Psychiatric: She has a normal mood and affect. Her behavior is normal.  Nursing note and vitals reviewed.    Musculoskeletal Exam:  Full range of motion of all joints Grip strength is poor because she is not able to make a full fist. She has about 80% fist formation bilaterally. This is mostly because of the swelling in her joints. Fiber myalgia tender points are all absent.  CDAI Exam: CDAI Homunculus Exam:   Tenderness:  Right hand: 2nd PIP, 3rd PIP and 4th PIP Left hand: 3rd PIP and 4th PIP  Swelling:  Right hand: 2nd PIP, 3rd PIP and 4th PIP Left hand: 3rd PIP and 4th PIP  Joint Counts:  CDAI Tender Joint count: 5 CDAI Swollen Joint count: 5  Global Assessments:  Patient Global Assessment: 8 Provider Global Assessment: 8  CDAI Calculated Score: 26    Investigation: Findings:  June 2017:  CBC was normal.  Comprehensive metabolic panel showed glucose of 171.  Her most recent labs from September 5 showed CBC was normal.  Comprehensive metabolic panel showed glucose of 158.  Admission on 04/17/2016, Discharged on 04/17/2016  Component Date Value Ref Range Status  . Glucose-Capillary 04/17/2016 99  65 - 99 mg/dL Final  . Glucose-Capillary 04/17/2016 109* 65 - 99 mg/dL Final  Hospital Outpatient Visit on 04/12/2016  Component Date Value Ref Range Status  . WBC 04/12/2016 12.4* 4.0 - 10.5 K/uL Final  . RBC 04/12/2016 4.17  3.87 - 5.11 MIL/uL Final  . Hemoglobin 04/12/2016  13.1  12.0 - 15.0 g/dL Final  . HCT 04/12/2016 39.6  36.0 - 46.0 % Final  . MCV 04/12/2016 95.0  78.0 - 100.0 fL Final  . MCH 04/12/2016 31.4  26.0 - 34.0 pg Final  . MCHC 04/12/2016 33.1  30.0 - 36.0 g/dL Final  . RDW 04/12/2016 14.0  11.5 - 15.5 % Final  . Platelets 04/12/2016 258  150 - 400 K/uL Final  . Neutrophils Relative % 04/12/2016 53  % Final  . Neutro Abs 04/12/2016 6.8  1.7 - 7.7 K/uL Final  . Lymphocytes Relative 04/12/2016 31  % Final  . Lymphs Abs 04/12/2016 3.8  0.7 - 4.0 K/uL Final  . Monocytes Relative 04/12/2016 9  % Final  . Monocytes Absolute 04/12/2016 1.1* 0.1 - 1.0 K/uL Final  . Eosinophils Relative 04/12/2016 6  % Final  . Eosinophils Absolute 04/12/2016 0.7  0.0 - 0.7 K/uL Final  . Basophils Relative 04/12/2016 1  % Final  . Basophils Absolute 04/12/2016 0.1  0.0 - 0.1 K/uL Final  . Sodium 04/12/2016 132* 135 - 145 mmol/L Final  . Potassium 04/12/2016 4.0  3.5 - 5.1 mmol/L Final  . Chloride 04/12/2016 105  101 - 111 mmol/L Final  . CO2 04/12/2016 20* 22 - 32 mmol/L Final  . Glucose, Bld 04/12/2016 125* 65 - 99 mg/dL Final  . BUN 04/12/2016 26* 6 - 20 mg/dL Final  . Creatinine, Ser 04/12/2016 0.97  0.44 - 1.00 mg/dL Final  . Calcium 04/12/2016 9.1  8.9 - 10.3 mg/dL Final  . GFR calc non Af Amer 04/12/2016 60* >60 mL/min Final  . GFR calc Af Amer 04/12/2016 >60  >60 mL/min Final   Comment: (NOTE) The eGFR has been calculated using the CKD EPI equation. This calculation has not been validated in all clinical situations. eGFR's persistently <60 mL/min signify possible Chronic Kidney Disease.   . Anion gap 04/12/2016 7  5 - 15 Final  Orders Only on 03/14/2016  Component Date Value Ref Range Status  . Prescribed Drug 1 03/14/2016 Hydrocodone  Final  . Creatinine 03/14/2016 86.6  > or = 20.0 mg/dL Final  . pH 03/14/2016 6.42  4.5 - 9.0 Final  . Oxidant 03/14/2016 NEGATIVE  <200 mcg/mL Final  . Amphetamines 03/14/2016 NEGATIVE  <500 ng/mL Final  . [facility]  Amphetamines 03/14/2016 CONSISTENT   Final  . Barbiturates 03/14/2016 NEGATIVE  <300 ng/mL Final  . [facility] Barbiturates 03/14/2016 CONSISTENT   Final  . Benzodiazepines 03/14/2016 NEGATIVE  <100 ng/mL Final  . [facility] Benzodiazepines 03/14/2016 CONSISTENT   Final  . Marijuana Metabolite 03/14/2016 NEGATIVE  <20 ng/mL Final  . [facility] Marijuana Metab 03/14/2016 CONSISTENT   Final  . Cocaine Metabolite 03/14/2016 NEGATIVE  <150 ng/mL Final  . [facility] Cocaine Metab 03/14/2016 CONSISTENT   Final  . Methadone Metabolite 03/14/2016 NEGATIVE  <100 ng/mL Final  . [facility] Methadone Metab 03/14/2016 CONSISTENT   Final  . Opiates 03/14/2016 POSITIVE* <100 ng/mL Final  . Codeine 03/14/2016 NEGATIVE  <50 ng/mL Final  . [facility] Codeine 03/14/2016 CONSISTENT   Final  . Hydrocodone 03/14/2016 990* <50 ng/mL Final  . [facility] Hydrocodone 03/14/2016 CONSISTENT   Final  . Hydromorphone 03/14/2016 100* <50 ng/mL Final  . [facility] Hydromorphone 03/14/2016 CONSISTENT   Final   Comment: Hydromorphone is a metabolite of hydrocodone as well as a prescribed drug.   Marland Kitchen Morphine 03/14/2016 NEGATIVE  <50 ng/mL Final  . [facility]MATCH Morphine 03/14/2016 CONSISTENT   Final  . Norhydrocodone 03/14/2016 1411* <50 ng/mL Final  . [facility] Norhydrocodone 03/14/2016 CONSISTENT   Final  . Oxycodone 03/14/2016 NEGATIVE  <100 ng/mL Final  . [facility] Oxycodone 03/14/2016 CONSISTENT   Final  . See Note: 03/14/2016     Final   Comment: This drug testing is for medical treatment only.   Analysis was performed as non-forensic testing and  these results should be used only by healthcare  providers to render diagnosis or treatment, or to  monitor progress of medical conditions.   [facility] comments are:  - present when drug test results may be the result of     metabolism of one or more drugs or when results are     inconsistent with prescribed medication(s) listed.  - may be blank when drug results are consistent with      prescribed medication(s) listed.   For assistance with interpreting these drug results,  please contact a [employer] Toxicology  Specialist: [phone] [place] [phone]), M-F,  8am-6pm EST.   This drug testing is for medical treatment only. Analysis was performed as non-forensic testing and these results should be used only by healthcare providers to render diagnosis or treatment, or to monitor progress of medical conditions.   For assistance with interpreting these dr                          ug results, please contact a [employer] Toxicology Specialist: [phone] [place] [phone]), M-F, 8am-6pm EST.     Telephone on 03/13/2016  Component Date Value Ref Range Status  . Toxigenic C Difficile by pcr 03/14/2016 Not Detected  Not Detected Final   Comment: This test is for use only with liquid or soft stools; performance characteristics of other clinical specimen types have not been established.   This assay was performed by Cepheid GeneXpert(R) PCR. The performance characteristics of this assay have been determined by [employer]. Performance characteristics refer to the analytical performance of the test.   . Campylobacter, PCR 03/14/2016 Not Detected   Final  . C.  difficile Tox A/B, PCR 03/14/2016 Not Detected   Final  . E coli 0157, PCR 03/14/2016 Not Detected   Final  . E coli (ETEC) LT/ST PCR 03/14/2016 Not Detected   Final  . E coli (STEC) stx1/stx2, PCR 03/14/2016 Not Detected   Final  . Salmonella, PCR 03/14/2016 Not Detected   Final  . Shigella, PCR 03/14/2016 Not Detected   Final  . Norovirus, PCR 03/14/2016 Not Detected   Final  . Rotavirus A, PCR 03/14/2016 Not Detected   Final  . Giardia lamblia, PCR 03/14/2016 Not Detected   Final  . Cryptosporidium, PCR 03/14/2016 Not Detected   Final   Comment:     ** Normal Reference Range for each Analyte: Not Detected **         ** The xTAG Gastrointestinal Pathogen Panel results are  presumptive and must be confirmed by FDA-cleared tests or other acceptable reference methods.  The results of this test should not be used as the sole basis for diagnosis, treatment, or other patient management decisions.   Performed using the Luminex xTAG Gastrointestinal Pathogen Panel test kit.       Imaging: No results found.     May 21 2016: Patient has had long standing diarrhea for multiple reasons. Currently they think it's because of combination of Cymbalta and Entocort (for Inflam Colitis)  Speciality Comments: No specialty comments available.    Procedures:  Large Joint Inj Date/Time: 05/21/2016 2:10 PM Performed by: Eliezer Lofts Authorized by: Eliezer Lofts   Consent Given by:  Patient Site marked: the procedure site was marked   Timeout: prior to procedure the correct patient, procedure, and site was verified   Indications:  Pain Location:  Hip Site:  L greater trochanter Prep: patient was prepped and draped in usual sterile fashion   Needle Size:  27 G Approach:  Superior Ultrasound Guidance: No   Fluoroscopic Guidance: No   Arthrogram: No   Medications:  1.5 mL lidocaine 1 %; 40 mg triamcinolone acetonide 40 MG/ML Aspiration Attempted: Yes   Aspirate amount (mL):  0 Patient tolerance:  Patient tolerated the procedure well with no immediate complications  Left greater trochanter bursa was injected. Patient had improvement after the injection. Large Joint Inj Date/Time: 05/21/2016 2:11 PM Performed by: Eliezer Lofts Authorized by: Eliezer Lofts   Consent Given by:  Patient Site marked: the procedure site was marked   Timeout: prior to procedure the correct patient, procedure, and site was verified   Indications:  Pain Location:  Hip Site:  L hip joint Prep: patient was prepped and draped in usual sterile fashion   Needle Size:  27 G Needle Length:  1.5 inches Approach:  Superior Ultrasound Guidance: No   Fluoroscopic Guidance:  No   Arthrogram: No   Medications:  1 mL lidocaine 1 %; 40 mg triamcinolone acetonide 40 MG/ML Aspiration Attempted: Yes   Aspirate amount (mL):  0  Left SI joint was injected  PATIENT TOLERATED PROCEDURE WELL. THERE WERE NO COMPLICATIONS.     Allergies: Adalimumab; Imuran [azathioprine sodium]; Methotrexate; Plaquenil [hydroxychloroquine sulfate]; Sulfonamide derivatives; Ceftin [cefuroxime axetil]; and Morphine   Assessment / Plan:     Visit Diagnoses: Psoriatic arthritis ([place])  Psoriasis  High risk medication use - Cosentyx 300 mg every month ([place]);// Arava '20mg'$  qd // Pred '5mg'$  qd //[Pt has had inadequate response to Plaquenil and Remicade.] - Plan: Comprehensive metabolic panel, CBC with Differential/Platelet  Sacroiliitis, not elsewhere classified (HCC)  Primary osteoarthritis involving multiple joints  Age-related osteoporosis without current pathological fracture  Trochanteric bursitis, left hip  Essential hypertension  Chronic pain syndrome  Acute left-sided thoracic back pain    Plan: #1: Psoriatic arthritis and psoriasis are doing well with Cosentyx every month. Because she was off of Cosentyx for about a month, she has flared. Today she is having pain to bilateral hands.  #2: High risk prescription. On Cosentyx 3 mg monthly but has suspended usage due to diarrhea for about a month. Note that her diarrhea lasted for 3 months. Patient is on Arava 20 mg every day She is on prednisone 5 mg daily. These combinations of medications are working well for the patient when she is on them.  #3: Left greater trochanter bursa pain going on for about 2 weekS and worse over the last 1 week. I will treat this with the cortisone injection 40 mg of Kenalog mixed with one half mL's 1% lidocaine without epinephrine.  #4: Left SI joint also painful for the last 2 weeks and worse over the last 1 week. I will treat this with the cortisone injection 40 mg of Kenalog mixed with  one half mL's 1% lidocaine without epinephrine.  #5: Left knee pain. Note I believe the left knee pain is actually coming from the left SI joint pain and left greater trochanter bursa pain.  #6: Patient will need labs starting in April 2018. Of place a lab order.Patient will need CBC with differential, CMP with GFR every 3 months starting April 2018. She can come any time in the month of April to get her labs drawn.  Currently patient is on Cosentyx 300 mg monthly, Arava 20 mg daily, prednisone 5 mg daily.  Orders: Orders Placed This Encounter  Procedures  . Comprehensive metabolic panel  . CBC with Differential/Platelet   No orders of the defined types were placed in this encounter.   Face-to-face time spent with patient was 30 minutes. 50% of time was spent in counseling and coordination of care.  Follow-Up Instructions: Return in about 5 months (around 10/18/2016) for PsA,Ps,Cosentyx '300mg'$  q wk, Arava '20mg'$  qd; prdnisone '5mg'$  qd.   Eliezer Lofts, PA-C  Note - This record has been created using Editor, commissioning.  Chart creation errors have been sought, but may not always  have been located. Such creation errors do not reflect on  the standard of medical care.

## 2016-05-21 ENCOUNTER — Other Ambulatory Visit: Payer: Self-pay | Admitting: *Deleted

## 2016-05-21 ENCOUNTER — Encounter: Payer: Self-pay | Admitting: Rheumatology

## 2016-05-21 ENCOUNTER — Ambulatory Visit (INDEPENDENT_AMBULATORY_CARE_PROVIDER_SITE_OTHER): Payer: Medicare Other | Admitting: Rheumatology

## 2016-05-21 VITALS — BP 138/72 | HR 70 | Resp 14 | Ht <= 58 in | Wt 142.0 lb

## 2016-05-21 DIAGNOSIS — M150 Primary generalized (osteo)arthritis: Secondary | ICD-10-CM

## 2016-05-21 DIAGNOSIS — L4050 Arthropathic psoriasis, unspecified: Secondary | ICD-10-CM

## 2016-05-21 DIAGNOSIS — M7062 Trochanteric bursitis, left hip: Secondary | ICD-10-CM

## 2016-05-21 DIAGNOSIS — M8949 Other hypertrophic osteoarthropathy, multiple sites: Secondary | ICD-10-CM

## 2016-05-21 DIAGNOSIS — M461 Sacroiliitis, not elsewhere classified: Secondary | ICD-10-CM

## 2016-05-21 DIAGNOSIS — M810 Age-related osteoporosis without current pathological fracture: Secondary | ICD-10-CM

## 2016-05-21 DIAGNOSIS — M159 Polyosteoarthritis, unspecified: Secondary | ICD-10-CM

## 2016-05-21 DIAGNOSIS — G894 Chronic pain syndrome: Secondary | ICD-10-CM

## 2016-05-21 DIAGNOSIS — I10 Essential (primary) hypertension: Secondary | ICD-10-CM | POA: Diagnosis not present

## 2016-05-21 DIAGNOSIS — L409 Psoriasis, unspecified: Secondary | ICD-10-CM | POA: Diagnosis not present

## 2016-05-21 DIAGNOSIS — M546 Pain in thoracic spine: Secondary | ICD-10-CM

## 2016-05-21 DIAGNOSIS — Z79899 Other long term (current) drug therapy: Secondary | ICD-10-CM | POA: Diagnosis not present

## 2016-05-21 MED ORDER — TRIAMCINOLONE ACETONIDE 40 MG/ML IJ SUSP
40.0000 mg | INTRAMUSCULAR | Status: AC | PRN
  Administered 2016-05-21: 40 mg via INTRA_ARTICULAR

## 2016-05-21 MED ORDER — LIDOCAINE HCL 1 % IJ SOLN
1.0000 mL | INTRAMUSCULAR | Status: AC | PRN
  Administered 2016-05-21: 1 mL

## 2016-05-21 MED ORDER — COSENTYX SENSOREADY (300 MG) 150 MG/ML ~~LOC~~ SOAJ: 2.0000 "pen " | SUBCUTANEOUS | 0 refills | Status: DC

## 2016-05-21 MED ORDER — LIDOCAINE HCL 1 % IJ SOLN
1.5000 mL | INTRAMUSCULAR | Status: AC | PRN
  Administered 2016-05-21: 1.5 mL

## 2016-05-21 NOTE — Telephone Encounter (Signed)
Can get tb gold updated at next lab draw.  Please place future order.

## 2016-05-21 NOTE — Telephone Encounter (Signed)
Last Visit: 05/21/16 Next Visit: 10/18/16 Labs: 12/06/15  TB Gold: 05/11/15 Neg  Patient updated CBC and CMP in office today. Patient is due to update TB Gold. Do you want her to come back for another lab draw or wait until her next labs in April to update her TB Gold?   Okay to refill Cosentyx?

## 2016-05-22 ENCOUNTER — Other Ambulatory Visit: Payer: Self-pay | Admitting: *Deleted

## 2016-05-22 ENCOUNTER — Telehealth: Payer: Self-pay | Admitting: Pharmacist

## 2016-05-22 DIAGNOSIS — E119 Type 2 diabetes mellitus without complications: Secondary | ICD-10-CM | POA: Diagnosis not present

## 2016-05-22 DIAGNOSIS — E559 Vitamin D deficiency, unspecified: Secondary | ICD-10-CM | POA: Diagnosis not present

## 2016-05-22 DIAGNOSIS — I10 Essential (primary) hypertension: Secondary | ICD-10-CM | POA: Diagnosis not present

## 2016-05-22 DIAGNOSIS — M810 Age-related osteoporosis without current pathological fracture: Secondary | ICD-10-CM | POA: Diagnosis not present

## 2016-05-22 NOTE — Telephone Encounter (Signed)
Patient was seen in the office yesterday.  I had a message on my desk stating patient may have difficulty getting her medication starting in April as she has tried to apply for "The Assistance Fund" grant in November 0000000 and her application is still pending.  I called patient to discuss.  Patient was not home right now.  Will try back later.  Will discuss applying for Cosentyx patient assistance program if the grant is not approved in the next month.

## 2016-05-22 NOTE — Telephone Encounter (Signed)
I received a return phone call from patient.  I discussed medication assistance with her.  Reviewed that her application for "[employer] [place]" may still be processing as it appears the grant does not have any funds available at this time.  Discussed use of [employer] patient assistance program for Cosentyx.  Reviewed application process and criteria.  Patient reports she may not qualify based on income, but will review her income information from 2017 and will let me know sometime next week.    Also discussed that there are other grant foundations for psoriatic arthritis that may be considered.  Advised they do not have funding now but may in the future.  I signed patient up to receive email alerts if the [place] gets funding available.  Advised patient to sign up for the program ASAP if she gets a notification that funding becomes available.  Patient voiced understanding.    Plan:  Patient will continue to try to get enrolled in a grant foundation to assist with her Cosentyx copay.  She will also consider applying for the [employer] patient assistance program.  She will review her income information and will let me know if she is interested in proceeding with the application.    Elisabeth Most, Pharm.D., BCPS, CPP Clinical Pharmacist Pager: [phone] Phone: [phone] 05/22/2016 10:06 AM

## 2016-05-23 ENCOUNTER — Encounter: Payer: Self-pay | Admitting: Nurse Practitioner

## 2016-05-23 ENCOUNTER — Ambulatory Visit (INDEPENDENT_AMBULATORY_CARE_PROVIDER_SITE_OTHER): Payer: Medicare Other | Admitting: Nurse Practitioner

## 2016-05-23 VITALS — BP 158/83 | HR 100 | Temp 97.8°F | Ht <= 58 in | Wt 138.8 lb

## 2016-05-23 DIAGNOSIS — A09 Infectious gastroenteritis and colitis, unspecified: Secondary | ICD-10-CM

## 2016-05-23 DIAGNOSIS — K52831 Collagenous colitis: Secondary | ICD-10-CM | POA: Diagnosis not present

## 2016-05-23 DIAGNOSIS — R1084 Generalized abdominal pain: Secondary | ICD-10-CM | POA: Diagnosis not present

## 2016-05-23 DIAGNOSIS — K219 Gastro-esophageal reflux disease without esophagitis: Secondary | ICD-10-CM

## 2016-05-23 DIAGNOSIS — R197 Diarrhea, unspecified: Secondary | ICD-10-CM

## 2016-05-23 MED ORDER — OMEPRAZOLE 20 MG PO CPDR: 20.0000 mg | DELAYED_RELEASE_CAPSULE | Freq: Two times a day (BID) | ORAL | 2 refills | Status: DC

## 2016-05-23 MED ORDER — DICYCLOMINE HCL 10 MG PO CAPS: 10.0000 mg | ORAL_CAPSULE | Freq: Three times a day (TID) | ORAL | 1 refills | Status: DC

## 2016-05-23 NOTE — Progress Notes (Signed)
Referring Provider: [name], MD Primary Care Physician:  [name], MD Primary GI:  Dr. Oneida Alar  Chief Complaint  Patient presents with  . Diarrhea    HPI:   Tonya Hall is a 66 y.o. female who presents For follow-up on GERD and diarrhea. The patient was last seen in our office 03/21/2016 at which point she was doing well overall, worsening GERD recently, unable to identify triggers. Worsening diarrhea, 6 stools a day loose to watery, minimal abdominal pain which generally resolves after bowel movements. On probiotic and Imodium not really helping some minimal weight loss with persistent diarrhea. Increase Prilosec to twice daily, scheduled for colonoscopy on propofol/MAC due to persistent diarrhea.  Colonoscopy completed 04/17/2016 which found normal ileum, redundant colon, internal and external hemorrhoids. Surgical pathology as per below. Recommended lactose-free diet, continue medications, treat empirically for SIBO if no microscopic colitis on pathology and diarrhea not improved after one month. Repeat colonoscopy in 10 years.  Even collagenous colitis recommended tapering off Cymbalta, consider tricyclic antidepressant if any recurrent nerve pain. She was given Entocort 9 mg daily for one month  Today she states she took the first month of Entocort and diarrhea progressed from diarrhea to "sludge." It was $400+ for the month. Hasn't picked up the second month and will cost $200. Recently picked up a virus about 2 weeks ago and lasted 4 days, caused vomiting x 2 days and 1 day of diarrhea which returned to sludge or slightly more formed. Also with abdominal pain. Pain is lower abdominal cramping like she's going to have a loose stool but doesn't. Had diarrhea again this morning. Took Imodium this morning. Denies hematochezia, melena, fever, chills. Has had some subjective weight loss (6 pound loss objectively in 2 months). Also with increased gas. Denies chest pain, dyspnea,  dizziness, lightheadedness, syncope, near syncope. Denies any other upper or lower GI symptoms.     Past Medical History:  Diagnosis Date  . Anemia    at times  . Asthma    Albuterol in haler prn  . Cataract    immature unsure which eye  . Chronic back pain   . Degenerative disk disease    psoriatic  . Diabetes mellitus without complication (HCC)    diet controlled  . Esophageal motility disorder    Non-specific, see modified barium study/speech path, BP  . GERD (gastroesophageal reflux disease)    takes Protonix bid  . History of blood transfusion    did have a rash after receiving the blood  . History of blood transfusion    post c-section  . History of bronchitis    last time early 2014  . HTN (hypertension)    takes Metoprolol daily and SPironolactone daily  . Hypercholesterolemia   . Hyperlipidemia    takes Pravastatin daily  . Hypokalemia   . Lymphocytic colitis 05/26/10 TCS RMR   Responded to Entocort x 3 MOS  . Monilia infection 12/93  . Neuropathy ([place])   . Osteoporosis    gets Boniva every 3 months  . Peripheral edema    takes HCTZ daily  . PONV (postoperative nausea and vomiting)   . Psoriatic arthritis ([place])    Dr. Katherina Right  . Raynaud's disease   . Seasonal allergies   . Shortness of breath    with exertion  . SUI (stress urinary incontinence, female)   . Urinary urgency     Past Surgical History:  Procedure Laterality Date  . BIOPSY  04/17/2016  Procedure: BIOPSY;  Surgeon: Danie Binder, MD;  [facility]: [facility];  Service: Endoscopy;;  random colon bx's  . CESAREAN SECTION  1974, 1978      . COLONOSCOPY  06/19/2008   MOQ:HUTML internal hemorrhoids/mild sigmoin colon diverticulosis  . COLONOSCOPY  2012   Dr. Gala Romney: normal rectum, diverticula, lymphocytic colitis   . COLONOSCOPY WITH PROPOFOL N/A 04/17/2016   Procedure: COLONOSCOPY WITH PROPOFOL;  Surgeon: Danie Binder, MD;  Location: [facility];  Service: Endoscopy;  Laterality:  N/A;  900  . DILATION AND CURETTAGE OF UTERUS  1977   spontaneous abortion  . ESOPHAGOGASTRODUODENOSCOPY  06/19/2008   [id number] gastritis  . LUMBAR DISC ARTHROPLASTY  7/98  . LUMBAR FUSION  6/03, 11/08, 11/14   L4-5 fusion, L2-3 fusion, L1-2  . TONSILLECTOMY AND ADENOIDECTOMY    . TUBAL LIGATION      Current Outpatient Prescriptions  Medication Sig Dispense Refill  . albuterol (PROVENTIL HFA;VENTOLIN HFA) 108 (90 BASE) MCG/ACT inhaler Inhale 2 puffs into the lungs every 6 (six) hours as needed for wheezing or shortness of breath.     Marland Kitchen aspirin 81 MG tablet Take 81 mg by mouth daily.      Marland Kitchen b complex vitamins capsule Take 1 capsule by mouth daily.    . budesonide (ENTOCORT EC) 3 MG 24 hr capsule 3 PO DAILY FOR 1 MO THEN 2 PO DAILY FOR 1 MO THEN 1 PO DAILY FOR 1 MO. 180 capsule 0  . Calcium Carb-Cholecalciferol (CALCIUM 600 + D PO) Take 1 tablet by mouth 2 (two) times daily.    . cetirizine (ZYRTEC) 10 MG tablet Take 10 mg by mouth daily as needed for allergies.    Hillary Bow SENSOREADY 300 DOSE 150 MG/ML SOAJ Inject 2 pens into the skin every 30 (thirty) days. Reported on 08/12/2015 6 pen 0  . diclofenac sodium (VOLTAREN) 1 % GEL Apply 2 g topically 3 (three) times daily as needed (pain).   2  . fish oil-omega-3 fatty acids 1000 MG capsule Take 2 g by mouth 2 (two) times daily.     . Ginger, Zingiber officinalis, (GINGER ROOT) 500 MG CAPS Take 1 capsule by mouth daily.     . Glucosamine Sulfate 1000 MG CAPS Take 2 capsules (2,000 mg total) by mouth daily.    Marland Kitchen guaiFENesin-dextromethorphan (ROBITUSSIN DM) 100-10 MG/5ML syrup Take 10 mLs by mouth every 6 (six) hours as needed for cough.    . hydrocortisone sodium succinate (SOLU-CORTEF) 100 MG SOLR injection Inject 100 mg into the vein. Take as needed when unable to swallow prednisone tablets    . Hypromellose (ARTIFICIAL TEARS OP) Apply 1 drop to eye daily as needed (dry eyes).    . Lactase (LACTASE FAST ACTING) 9000 units TABS 1 po tid  90 tablet 11  . leflunomide (ARAVA) 20 MG tablet TAKE 1 TABLET BY MOUTH DAILY. (Patient taking differently: TAKE 1 TABLET BY MOUTH DAILY AT BEDTIME) 90 tablet 0  . loperamide (IMODIUM A-D) 2 MG tablet Take 2-4 mg by mouth 2 (two) times daily as needed for diarrhea or loose stools.    . metoprolol succinate (TOPROL-XL) 50 MG 24 hr tablet Take 1 tablet (50 mg total) by mouth daily. Take with or immediately following a meal.    . Misc Natural Products (TART CHERRY ADVANCED PO) Take 1 tablet by mouth daily. daily     . Multiple Vitamin (MULTIVITAMIN) tablet Take 1 tablet by mouth daily.      Marland Kitchen  omeprazole (PRILOSEC) 20 MG capsule Take 1 capsule (20 mg total) by mouth 2 (two) times daily before a meal. 180 capsule 2  . phenylephrine (SUDAFED PE) 10 MG TABS tablet Take 10 mg by mouth 2 (two) times daily as needed (congestion).    . potassium chloride SA (K-DUR,KLOR-CON) 20 MEQ tablet Take 20 mEq by mouth 2 (two) times daily.    . pravastatin (PRAVACHOL) 40 MG tablet Take 40 mg by mouth every evening.     . predniSONE (DELTASONE) 5 MG tablet Take 1 tablet (5 mg total) by mouth daily with breakfast. (Patient taking differently: Take 5 mg by mouth daily with breakfast. May take an additional 82ms as needed when feeling sick) 90 tablet 0  . PROBIOTIC CAPS Take 1 capsule by mouth daily.    . RESTASIS MULTIDOSE 0.05 % ophthalmic emulsion Place 1 drop into both eyes 2 (two) times daily.     . sodium chloride (OCEAN) 0.65 % SOLN nasal spray Place 1 spray into both nostrils as needed for congestion.    .Marland Kitchenspironolactone (ALDACTONE) 25 MG tablet Take 25 mg by mouth daily.     .Marland KitchentiZANidine (ZANAFLEX) 4 MG tablet Take 1 tablet (4 mg total) by mouth at bedtime. 30 tablet 2  . topiramate (TOPAMAX) 200 MG tablet TAKE (1) TABLET BY MOUTH AT BEDTIME. 90 tablet 0  . Turmeric 500 MG CAPS Take 1 capsule by mouth daily.     . Zoledronic Acid (RECLAST IV) Inject into the vein.     . cyclobenzaprine (FLEXERIL) 5 MG tablet TAKE  (1) TABLET BY MOUTH THREE TIMES DAILY AS NEEDED. DO NOT DRIVE WHILE USING FLEXERIL. (Patient not taking: Reported on 05/23/2016) 270 tablet 0   No current facility-administered medications for this visit.     Allergies as of 05/23/2016 - Review Complete 05/23/2016  Allergen Reaction Noted  . Adalimumab Rash and Other (See Comments)   . Imuran [azathioprine sodium] Rash 07/03/2011  . Methotrexate Other (See Comments)   . Plaquenil [hydroxychloroquine sulfate] Rash 07/03/2011  . Sulfonamide derivatives Rash   . Ceftin [cefuroxime axetil] Nausea Only 07/06/2014  . Morphine Nausea And Vomiting     Family History  Problem Relation Age of Onset  . Rheum arthritis Mother   . Diabetes Mother   . Pancreatitis Mother   . Rheumatic fever Father   . Diabetes      grandparents  . Skin cancer      grandfather  . Hypertension      grandparent  . Congestive Heart Failure      grandfather  . Parkinson's disease      grandmother  . Colon cancer Maternal Uncle   . Colon polyps Neg Hx     Social History   Social History  . Marital status: Married    Spouse name: N/A  . Number of children: N/A  . Years of education: N/A   Occupational History  . Case Mgr RN [employer]  Social History Main Topics  . Smoking status: Never Smoker  . Smokeless tobacco: Never Used  . Alcohol use No  . Drug use: No  . Sexual activity: Yes    Partners: Male    Birth control/ protection: Surgical     Comment: BTL   Other Topics Concern  . None   Social History Narrative  . None    Review of Systems: Complete ROS negative except as per HPI.   Physical Exam: BP (Marland Kitchen  158/83   Pulse 100   Temp 97.8 F (36.6 C) (Oral)   Ht 4' 8"  (1.422 m)   Wt 138 lb 12.8 oz (63 kg)   LMP 02/01/1999   BMI 31.12 kg/m  General:   Alert and oriented. Pleasant and cooperative. Well-nourished and well-developed.  Eyes:  Without icterus, sclera clear and conjunctiva pink.  Ears:  Normal auditory  acuity. Cardiovascular:  S1, S2 present without murmurs appreciated. Extremities without clubbing or edema. Respiratory:  Clear to auscultation bilaterally. No wheezes, rales, or rhonchi. No distress.  Gastrointestinal:  +BS, soft, and non-distended. Mild lower abdominal TTP. No HSM noted. No guarding or rebound. No masses appreciated.  Rectal:  Deferred  Musculoskalatal:  Symmetrical without gross deformities. Neurologic:  Alert and oriented x4;  grossly normal neurologically. Psych:  Alert and cooperative. Normal mood and affect. Heme/Lymph/Immune: No excessive bruising noted.    05/23/2016 12:18 PM   Disclaimer: This note was dictated with voice recognition software. Similar sounding words can inadvertently be transcribed and may not be corrected upon review.

## 2016-05-23 NOTE — Addendum Note (Signed)
Addended by: Gordy Levan, Welles [place] A on: 05/23/2016 12:36 PM   Modules accepted: Orders

## 2016-05-23 NOTE — Assessment & Plan Note (Signed)
Collagenous colitis on colonoscopy. Is about to finish her first month of Entocort. We'll proceed to the second and third month of Entocort. Continue medication, return for follow-up in 4 weeks. She is still having some diarrhea, but this is likely due to a stomach bug she picked up a couple weeks ago.

## 2016-05-23 NOTE — Progress Notes (Signed)
cc'ed to pcp °

## 2016-05-23 NOTE — Assessment & Plan Note (Signed)
Some new onset lower abdominal pain associated with diarrhea after picking up which she believes is a stomach virus a couple weeks ago. She is also undergoing treatment for collagenous colitis. Overall improvement noted, but not as fast as she would like. I discussed postinfectious IBS with her. Diarrhea management as per above. Return for follow-up in 4 weeks.

## 2016-05-23 NOTE — Assessment & Plan Note (Signed)
Some noted worsening of GERD symptoms. I will increase Prilosec to twice a day for the next month. Return for follow-up in 4 weeks.

## 2016-05-23 NOTE — Patient Instructions (Signed)
1. Increase her Prilosec to twice a day: 30 minutes before your first meal the day and 30 minutes before your last meals a day. 2. Continue taking Entocort for the full course of therapy. 3. I sent in Bentyl to your pharmacy. Take a 10 mg pill 3 times a day with meals and at bedtime, as needed for diarrhea or abdominal pain. 4. Return for follow-up in 4 weeks.

## 2016-05-23 NOTE — Assessment & Plan Note (Signed)
Diarrhea has improved to "pasty" stools. She did have what is likely a self-limited gastroenteritis about 2 weeks ago with 2 days of vomiting and 1 day of diarrhea. She subsequently had another day of diarrhea. Otherwise, improvement continue. Recommend she continue Entocort. Possible postinfectious IBS. He remains on a probiotic. I will start her on Bentyl 10 mg 3 times a day and at bedtime as needed for diarrhea or lower abdominal cramping. Return for follow-up in 4 weeks.

## 2016-05-24 ENCOUNTER — Telehealth: Payer: Self-pay

## 2016-05-24 DIAGNOSIS — K219 Gastro-esophageal reflux disease without esophagitis: Secondary | ICD-10-CM

## 2016-05-24 DIAGNOSIS — R1084 Generalized abdominal pain: Secondary | ICD-10-CM

## 2016-05-24 DIAGNOSIS — K52831 Collagenous colitis: Secondary | ICD-10-CM

## 2016-05-24 DIAGNOSIS — R197 Diarrhea, unspecified: Secondary | ICD-10-CM

## 2016-05-24 MED ORDER — DICYCLOMINE HCL 10 MG PO CAPS: 10.0000 mg | ORAL_CAPSULE | Freq: Three times a day (TID) | ORAL | 1 refills | Status: DC

## 2016-05-24 NOTE — Telephone Encounter (Signed)
Pt is calling because the pharmacy does not have the Rx for the Bentyl. She needs to have it sent to [facility].

## 2016-05-24 NOTE — Telephone Encounter (Signed)
Sent to [facility] as per patient request.

## 2016-05-25 ENCOUNTER — Telehealth: Payer: Self-pay | Admitting: Gastroenterology

## 2016-05-25 NOTE — Telephone Encounter (Signed)
Pt said that she is still waiting for her Bentyl and Protonix to be called into [place] and she needs them today.

## 2016-05-25 NOTE — Telephone Encounter (Signed)
Spoke with Event organiser at [facility], they have rx's but the insurance is showing that it was filled by mail order and the insurance wont pay for it. I have called and informed the pt. She said she is going to call the mail order and cancel it and then will call [facility] and let them know to fill it.

## 2016-05-28 ENCOUNTER — Other Ambulatory Visit: Payer: Self-pay | Admitting: Rheumatology

## 2016-05-28 ENCOUNTER — Telehealth: Payer: Self-pay

## 2016-05-28 MED ORDER — HYDROCODONE-ACETAMINOPHEN 5-325 MG PO TABS: ORAL_TABLET | ORAL | 0 refills | Status: DC

## 2016-05-28 NOTE — Telephone Encounter (Signed)
Noted  

## 2016-05-28 NOTE — Telephone Encounter (Signed)
ok 

## 2016-05-28 NOTE — Telephone Encounter (Signed)
Patient called requesting a refill of her hydrocodone and also wants to speak to Apolonio Schneiders about her Cosentyx.  Cb#[phone].  Thank you.Marland Kitchen

## 2016-05-28 NOTE — Telephone Encounter (Signed)
Control substance database was reviewed.  Patient has not received control substances from any other provider in the past 6 months.

## 2016-05-28 NOTE — Telephone Encounter (Signed)
Last Visit: 05/21/16 Next Visit: 10/18/16 UDS:03/14/16 Narc Agreement: 12/30/15  Okay to refill Hydrocodone and Prednisone?

## 2016-05-28 NOTE — Telephone Encounter (Signed)
[place] to check the status of patients application.Spoke with Candace who states the patient is inactive. She has not been approved due to funding. She suggests that we continue to check back to see when funding will be available.   Phone: [phone] Reference number: none   Spoke with patient who states she received a letter in the mail with the same information. She will gather her financial documents and call us back to see what other assistance programs she may qualify for. She had her last dose on February 20th. She has one more pen for March 20th. She will not need any assistance until 07/1716. Patient voiced understanding and had no other questions at this time.   Tonya Hall, [place], CPhT

## 2016-05-28 NOTE — Telephone Encounter (Signed)
Last Visit: 05/21/16 Next Visit: 10/18/16 Labs: 12/06/15   Okay to refill Prednisone?

## 2016-05-29 NOTE — Telephone Encounter (Signed)
I called Mrs. Hendricks as requested to discuss her Cosentyx.  Mrs. Sarratt said she discussed [employer] with [place] earlier.  She confirmed they do not have any funding at this time.  The [employer] Provided her with information on other programs ([employer]).  I reviewed these programs and determined that they do not have funds either.  We have signed the patient up to receive an email alert if the [employer] gets funding.    Patient will continue to consider applying for the [employer] patient assistance program.  She states she is going to review her income information tomorrow and will let me know if she is interested in applying.  Will assist patient with application once I hear from her.     Elisabeth Most, Pharm.D., BCPS, CPP Clinical Pharmacist Pager: [phone] Phone: [phone] 05/29/2016 8:24 AM

## 2016-06-05 DIAGNOSIS — I10 Essential (primary) hypertension: Secondary | ICD-10-CM | POA: Diagnosis not present

## 2016-06-05 DIAGNOSIS — E785 Hyperlipidemia, unspecified: Secondary | ICD-10-CM | POA: Diagnosis not present

## 2016-06-05 DIAGNOSIS — E875 Hyperkalemia: Secondary | ICD-10-CM | POA: Diagnosis not present

## 2016-06-12 DIAGNOSIS — E119 Type 2 diabetes mellitus without complications: Secondary | ICD-10-CM | POA: Diagnosis not present

## 2016-06-12 DIAGNOSIS — R609 Edema, unspecified: Secondary | ICD-10-CM | POA: Diagnosis not present

## 2016-06-18 ENCOUNTER — Telehealth: Payer: Self-pay | Admitting: Pharmacist

## 2016-06-18 ENCOUNTER — Ambulatory Visit (HOSPITAL_COMMUNITY): Payer: Medicare Other

## 2016-06-18 NOTE — Telephone Encounter (Signed)
I spoke with Dr. Oneida Alar, patient's GI. She stated that patient has known history of collagenous colitis and it was diagnosed prior to starting Cosyntex. She does not think that it is related to Cosyntex. She also mentioned that collagenous colitis is not inflammatory bowel disease. She is currently on budesonide and she will be on it for a total of 3 months. She's expecting complete resolution of the flare on budesonide.  Bo Merino, MD

## 2016-06-18 NOTE — Telephone Encounter (Signed)
I received an alert from patient's insurance company regarding therapy duplication (prednisone and budesonide).  Reviewed patient's chart and noted that patient has been taking prednisone 5 mg daily for her psoriatic arthritis in addition to Cosentyx 300 mg monthly and leflunomide 20 mg daily.  Patient had a colonoscopy on 04/17/16 that showed collagenous colitis, and was placed on budesonide taper (9 mg daily for one month, then 6 mg daily for one month, then 3 mg daily for one month).  Patient has been on budesonide since 04/22/16.    I called patient who confirms she was diagnosed with collagenous colitis.  This is a recurring diagnosis.  The first episode was approximately 7 years ago before initiation of Cosentyx.  Patient reports neither she nor her GI provider feels the colitis is related to her Cosentyx use.    I discussed the duplicate steroid therapy.  Patient reports her GI provider was aware that she was on prednisone when she was placed on budesonide.  Patient reports she has two more days of budesonide 6 mg therapy remaining, then she will be reducing dose to 3 mg daily for one month.  Reviewed risk of adverse effects with steroid therapy including effect on blood pressure and blood sugar and risk of GI effects.  Patient confirms her primary care is monitoring both very closely, and she is now on insulin to control her blood sugar.  Patient denies any questions or concerns regarding her medications at this time.    Elisabeth Most, Pharm.D., BCPS, CPP Clinical Pharmacist Pager: [phone] Phone: [phone] 06/18/2016 3:54 PM

## 2016-06-21 ENCOUNTER — Other Ambulatory Visit: Payer: Self-pay | Admitting: Pharmacist

## 2016-06-21 ENCOUNTER — Telehealth: Payer: Self-pay | Admitting: Pharmacist

## 2016-06-21 ENCOUNTER — Ambulatory Visit (INDEPENDENT_AMBULATORY_CARE_PROVIDER_SITE_OTHER): Payer: Medicare Other | Admitting: Otolaryngology

## 2016-06-21 DIAGNOSIS — H6123 Impacted cerumen, bilateral: Secondary | ICD-10-CM

## 2016-06-21 DIAGNOSIS — Z9225 Personal history of immunosupression therapy: Secondary | ICD-10-CM | POA: Diagnosis not present

## 2016-06-21 DIAGNOSIS — Z79899 Other long term (current) drug therapy: Secondary | ICD-10-CM | POA: Diagnosis not present

## 2016-06-21 DIAGNOSIS — H6983 Other specified disorders of Eustachian tube, bilateral: Secondary | ICD-10-CM

## 2016-06-21 DIAGNOSIS — H93293 Other abnormal auditory perceptions, bilateral: Secondary | ICD-10-CM | POA: Diagnosis not present

## 2016-06-21 LAB — COMPREHENSIVE METABOLIC PANEL
ALT: 434 U/L — ABNORMAL HIGH (ref 6–29)
AST: 161 U/L — ABNORMAL HIGH (ref 10–35)
Albumin: 3.8 g/dL (ref 3.6–5.1)
Alkaline Phosphatase: 58 U/L (ref 33–130)
BUN: 22 mg/dL (ref 7–25)
CO2: 21 mmol/L (ref 20–31)
Calcium: 9.1 mg/dL (ref 8.6–10.4)
Chloride: 107 mmol/L (ref 98–110)
Creat: 0.86 mg/dL (ref 0.50–0.99)
Glucose, Bld: 150 mg/dL — ABNORMAL HIGH (ref 65–99)
Potassium: 4.5 mmol/L (ref 3.5–5.3)
Sodium: 138 mmol/L (ref 135–146)
Total Bilirubin: 0.4 mg/dL (ref 0.2–1.2)
Total Protein: 6.3 g/dL (ref 6.1–8.1)

## 2016-06-21 LAB — CBC WITH DIFFERENTIAL/PLATELET
Basophils Absolute: 122 cells/uL (ref 0–200)
Basophils Relative: 1 %
Eosinophils Absolute: 366 cells/uL (ref 15–500)
Eosinophils Relative: 3 %
HCT: 39.7 % (ref 35.0–45.0)
Hemoglobin: 12.9 g/dL (ref 11.7–15.5)
Lymphocytes Relative: 12 %
Lymphs Abs: 1464 cells/uL (ref 850–3900)
MCH: 31.1 pg (ref 27.0–33.0)
MCHC: 32.5 g/dL (ref 32.0–36.0)
MCV: 95.7 fL (ref 80.0–100.0)
MPV: 11.1 fL (ref 7.5–12.5)
Monocytes Absolute: 610 cells/uL (ref 200–950)
Monocytes Relative: 5 %
Neutro Abs: 9638 cells/uL — ABNORMAL HIGH (ref 1500–7800)
Neutrophils Relative %: 79 %
Platelets: 253 10*3/uL (ref 140–400)
RBC: 4.15 MIL/uL (ref 3.80–5.10)
RDW: 14.3 % (ref 11.0–15.0)
WBC: 12.2 10*3/uL — ABNORMAL HIGH (ref 3.8–10.8)

## 2016-06-21 NOTE — Telephone Encounter (Signed)
ok 

## 2016-06-21 NOTE — Telephone Encounter (Signed)
Patient is applying for Cosentyx patient assistance program and will need prescription sent with application.    Last visit: 05/21/16 Next visit: 10/18/16 Labs: 04/12/16 CBC with WBC 12.4, BMP with Na 132 05/05/15 TB Gold negative  Patient had TB Gold drawn today.  She wanted to go ahead and have standing labs drawn too.    Okay to refill Cosentyx? The form is on your desk

## 2016-06-22 ENCOUNTER — Telehealth: Payer: Self-pay | Admitting: Gastroenterology

## 2016-06-22 ENCOUNTER — Telehealth: Payer: Self-pay | Admitting: Radiology

## 2016-06-22 DIAGNOSIS — R7401 Elevation of levels of liver transaminase levels: Secondary | ICD-10-CM

## 2016-06-22 DIAGNOSIS — R740 Nonspecific elevation of levels of transaminase and lactic acid dehydrogenase [LDH]: Principal | ICD-10-CM

## 2016-06-22 NOTE — Telephone Encounter (Addendum)
[facility] CALLED. PT HAS ELEVATED LIVER ENZYMES ON ARAVA. PLAN TO HOLD ARAVA. PT NEEDS RUQ [place] DX: TRANSAMINITIS. CONTINUE BUDESONIDE WHICH IS NOT ASSOCIATED WITH ELEVATED LIVER ENZYMES.

## 2016-06-22 NOTE — Assessment & Plan Note (Signed)
NEEDS RUQ [place] STOP ARAVA

## 2016-06-22 NOTE — Progress Notes (Signed)
[place]. And please contact her gastroenterologist to discuss LFTs. Please call the patient as well

## 2016-06-22 NOTE — Telephone Encounter (Signed)
Patient returned my call and was advised of the elevated LFTs and she will d/c Leflunomide. I did tell her to avoid NSAIDS alcohol and Tylenol. She states she will go for the [place] with GI when she returns from vacation, she is leaving today for 2 weeks in [place].   To you FYI

## 2016-06-22 NOTE — Telephone Encounter (Signed)
-----   Message from Eliezer Lofts, [place] sent at 06/22/2016  2:55 PM EDT -----  PLEASE SEND LABS TO DR. SANDY FIELDS (GI DOCTOR) I have send copy of labs to PCP And tell patient #1: CMP with GFR is within normal limits except elevated glucose at 150.  However patient's AST and ALT are elevated at 161 and 434.  #2: CBC with differential is within normal limits with mild elevation of WBC is 12.2 and absolute neutrophils at 9.6k  She recently was given ENTCORT FOR Collagenous  COLITIS (feb 2018 by her gi doctor.)

## 2016-06-22 NOTE — Telephone Encounter (Signed)
Left message for patient sent my chart message also

## 2016-06-25 ENCOUNTER — Other Ambulatory Visit: Payer: Self-pay

## 2016-06-25 LAB — QUANTIFERON TB GOLD ASSAY (BLOOD)
Interferon Gamma Release Assay: NEGATIVE
Mitogen-Nil: 9.49 IU/mL
Quantiferon Nil Value: 0.04 IU/mL
Quantiferon Tb Ag Minus Nil Value: 0 IU/mL

## 2016-06-25 NOTE — Progress Notes (Signed)
TB neg

## 2016-06-25 NOTE — Telephone Encounter (Signed)
Pt is out of town for 2 weeks

## 2016-06-27 ENCOUNTER — Telehealth: Payer: Self-pay | Admitting: Pharmacist

## 2016-06-27 NOTE — Telephone Encounter (Signed)
Received letter from [employer].  Patient was approved to receive Cosentyx at no cost for the remainder of the calendar year.  I attempted to call patient to inform her.  I was unable to reach her.  Will send her a message in [place].     Elisabeth Most, Pharm.D., BCPS, CPP Clinical Pharmacist Pager: [phone] Phone: [phone] 06/27/2016 10:35 AM

## 2016-07-04 ENCOUNTER — Ambulatory Visit: Payer: Medicare Other | Admitting: Gastroenterology

## 2016-07-05 NOTE — Telephone Encounter (Signed)
Pt is set up for [place] on 07/09/16 @ 8:30 am.

## 2016-07-05 NOTE — Telephone Encounter (Signed)
Appointment changed to 07/12/16 @ 8:30 am

## 2016-07-09 ENCOUNTER — Other Ambulatory Visit: Payer: Self-pay | Admitting: Rheumatology

## 2016-07-09 ENCOUNTER — Ambulatory Visit (HOSPITAL_COMMUNITY): Payer: Medicare Other

## 2016-07-09 NOTE — Telephone Encounter (Addendum)
Last visit: 05/21/16 Next visit: 10/18/16  Okay to refill Prednisone?

## 2016-07-10 ENCOUNTER — Other Ambulatory Visit: Payer: Self-pay | Admitting: Rheumatology

## 2016-07-10 MED ORDER — HYDROCODONE-ACETAMINOPHEN 5-325 MG PO TABS: ORAL_TABLET | ORAL | 0 refills | Status: DC

## 2016-07-10 NOTE — Telephone Encounter (Signed)
Last Visit: 05/21/16 Next Visit: 10/18/16 UDS:03/14/16 Narc Agreement: 12/30/15  Okay to refill Hydrocodone?

## 2016-07-10 NOTE — Telephone Encounter (Signed)
Patient left a message requesting a refill on her Hydrocodone.  CB#[phone].  Thank you.

## 2016-07-11 ENCOUNTER — Encounter: Payer: Self-pay | Admitting: Gastroenterology

## 2016-07-11 ENCOUNTER — Ambulatory Visit (INDEPENDENT_AMBULATORY_CARE_PROVIDER_SITE_OTHER): Payer: Medicare Other | Admitting: Gastroenterology

## 2016-07-11 DIAGNOSIS — R740 Nonspecific elevation of levels of transaminase and lactic acid dehydrogenase [LDH]: Secondary | ICD-10-CM

## 2016-07-11 DIAGNOSIS — R7401 Elevation of levels of liver transaminase levels: Secondary | ICD-10-CM

## 2016-07-11 DIAGNOSIS — K52831 Collagenous colitis: Secondary | ICD-10-CM

## 2016-07-11 MED ORDER — BUDESONIDE 3 MG PO CPEP: ORAL_CAPSULE | ORAL | 11 refills | Status: DC

## 2016-07-11 NOTE — Telephone Encounter (Signed)
I received notification that patient had no opened her MyChart message.  I reached out to patient and informed her that she was approved for Cosentyx through the [employer] patient assistance program.  I provided her with their phone number to set up delivery of her medication.  Patient voiced understanding and denies any questions or concerns regarding her medications at this time.    Elisabeth Most, Pharm.D., BCPS, CPP Clinical Pharmacist Pager: [phone] Phone: [phone] 07/11/2016 9:37 AM

## 2016-07-11 NOTE — Assessment & Plan Note (Signed)
LIKELY DUE TO MED, LESS LIKE DUE TO GB OR INFILTRATIVE LIVER DISEASE.  RUQ [place] RHEUMATOLOGY WILL RECHECK LIVER ENZYMES

## 2016-07-11 NOTE — Progress Notes (Signed)
Subjective:    Patient ID: Tonya Hall, female    DOB: Nov 01, 1950, 66 y.o.   MRN: [id number]  [name], MD  HPI CHANGE IN BOWEL IN HABITS: DIARRHEA WENT AWAY AND BACK TO ALMOST AFTER 1 MO. ON 2 A DAY PRETTY MUCH NORMAL AND FIRST WEEK OF TAKING ONE A DAY. ON 3 MG DAILY  AND HAD NORMAL STOOLS FOR 1 WEEK. WENT TO [place] AND ATE [employer] ONE MEAL A DAY.  DRINK TAP WATER AND PUT IT IN A BOTTLE. AFTER 10 DAYS IN [place] STARTED HAVING LOOSE/WATERY STOOLS. HAVING 2-3 A DAY. FIRST THING IN AM AND THE 1-2 IN THE DAY. NO NOCTURNAL STOOLS. FEELS GOOD . APPETITE: FINE. WENT OUT EVERY DAY IN [place] AND FELT SOB WITH EXERTION.  PT DENIES FEVER, CHILLS, HEMATOCHEZIA, HEMATEMESIS, nausea, vomiting, melena, CHEST PAIN, SHORTNESS OF BREATH, constipation, abdominal pain, problems swallowing, OR heartburn or indigestion.  Past Medical History:  Diagnosis Date  . Anemia    at times  . Asthma    Albuterol in haler prn  . Cataract    immature unsure which eye  . Chronic back pain   . Degenerative disk disease    psoriatic  . Diabetes mellitus without complication (HCC)    diet controlled  . Esophageal motility disorder    Non-specific, see modified barium study/speech path, BP  . GERD (gastroesophageal reflux disease)    takes Protonix bid  . History of blood transfusion    did have a rash after receiving the blood  . History of blood transfusion    post c-section  . History of bronchitis    last time early 2014  . HTN (hypertension)    takes Metoprolol daily and SPironolactone daily  . Hypercholesterolemia   . Hyperlipidemia    takes Pravastatin daily  . Hypokalemia   . Lymphocytic colitis 05/26/10 TCS RMR   Responded to Entocort x 3 MOS  . Monilia infection 12/93  . Neuropathy ([place])   . Osteoporosis    gets Boniva every 3 months  . Peripheral edema    takes HCTZ daily  . PONV (postoperative nausea and vomiting)   . Psoriatic arthritis ([place])    Dr. Katherina Right  .  Raynaud's disease   . Seasonal allergies   . Shortness of breath    with exertion  . SUI (stress urinary incontinence, female)   . Urinary urgency    Allergies  Allergen Reactions  . Adalimumab Rash and Other (See Comments)    FEVER, Fast pulse  . Imuran [Azathioprine Sodium] Rash    Denies Airway involvement  . Methotrexate Other (See Comments)    Liver Enzyme elevation - after 1 month of use  . Plaquenil [Hydroxychloroquine Sulfate] Rash    Denies airway involvement.   . Sulfonamide Derivatives Rash    Occurred with Sulfasalazine. Rash only.   . Ceftin [Cefuroxime Axetil] Nausea Only    Upset stomach  . Morphine Nausea And Vomiting    IV morphine caused N and V  After surgery.   Has tolerated [place] in the past     Current Outpatient Prescriptions  Medication Sig Dispense Refill  . albuterol 108 (90 BASE) MCG/ACT inhaler Inhale 2 puffs Q6H IF needed for wheezing or shortness of breath.     Marland Kitchen aspirin 81 MG tablet Take 81 mg by mouth daily.      . budesonide 3 MG 24 hr capsule 1 PO DAILY FOR 1 MO.     Marland Kitchen  CALCIUM 600 + D PO Take 1 tablet by mouth 2 (two) times daily.    Marland Kitchen ZYRTEC 10 MG tablet Take 10 mg by mouth daily as needed for allergies.    Hillary Bow SENSOREADY 300 DOSE 150 MG/ML SOAJ Inject 2 pens into the skin every 30 (thirty) days. Reported on 08/12/2015    . cyclobenzaprine (FLEXERIL) 5 MG tablet TAKE TID IF NEEDED. DO NOT DRIVE WHILE USING FLEXERIL.    Marland Kitchen diclofenac sodium (VOLTAREN) 1 % GEL Apply 2 g topically 3 (three) times daily as needed (pain).     Marland Kitchen dicyclomine 10 MG capsule Take mouth 4 (four) times daily -  before meals and at bedtime. (Patient taking differently: Take 10 mg by mouth as needed. )  2-3X/WEEK   . fish oil-omega-3 fatty acids 1000 MG capsule Take 2 g by mouth 2 (two) times daily.     . Ginger, Zingiber officinalis, (GINGER ROOT) 500 MG CAPS Take 1 capsule by mouth daily.     . Glucosamine Sulfate 1000 MG CAPS Take 2 capsules (2,000 mg total) by  mouth daily.    Marland Kitchen      Camille Bal KWIKPEN 100 UNIT/ML Kiwkpen Inject 10 Units into the skin at bedtime.    Marland Kitchen HYDROcodone-acetaminophen (NORCO/VICODIN) 5-325 MG tablet Take 1 tablet by by 2-3 times daily as needed for pain 90 tablet 0  . hydrocortisone sodium succinate (SOLU-CORTEF) 100 MG SOLR injection Inject 100 mg into the vein. Take as needed when unable to swallow prednisone tablets    . Hypromellose (ARTIFICIAL TEARS OP) Apply 1 drop to eye daily as needed (dry eyes).    Marland Kitchen loperamide (IMODIUM A-D) 2 MG tablet Take 2-4 mg by mouth 2 (two) times daily as needed for diarrhea or loose stools. THISS AM x1   . metoprolol succinate (TOPROL-XL) 50 MG 24 hr tablet Take 1 tablet (50 mg total) by mouth daily. Take with or immediately following a meal.    . Misc Natural Products (TART CHERRY ADVANCED PO) Take 1 tablet by mouth daily. daily     . Multiple Vitamin (MULTIVITAMIN) tablet Take 1 tablet by mouth daily.      Marland Kitchen omeprazole (PRILOSEC) 20 MG capsule Take 1 capsule (20 mg total) by mouth 2 (two) times daily before a meal. 180 capsule 2  . phenylephrine (SUDAFED PE) 10 MG TABS tablet Take 10 mg by mouth 2 (two) times daily as needed (congestion).    . potassium chloride SA (K-DUR,KLOR-CON) 20 MEQ tablet Take 20 mEq by mouth 2 (two) times daily.    . predniSONE (DELTASONE) 5 MG tablet TAKE 1 TABLET BY MOUTH EVERY MORNING.    Marland Kitchen PROBIOTIC CAPS Take 1 capsule by mouth daily.    . RESTASIS MULTIDOSE 0.05 % ophthalmic emulsion Place 1 drop into both eyes 2 (two) times daily.     . sodium chloride (OCEAN) 0.65 % SOLN nasal spray Place 1 spray into both nostrils as needed for congestion.    Marland Kitchen spironolactone (ALDACTONE) 25 MG tablet Take 25 mg by mouth daily.     Marland Kitchen tiZANidine (ZANAFLEX) 4 MG tablet Take 1 tablet (4 mg total) by mouth at bedtime.    . topiramate (TOPAMAX) 200 MG tablet TAKE (1) TABLET BY MOUTH AT BEDTIME.    . Turmeric 500 MG CAPS Take 1 capsule by mouth daily.     . Zoledronic Acid  (RECLAST IV) Inject into the vein.     Marland Kitchen b complex vitamins capsule Take 1  capsule by mouth daily.    .      . pravastatin (PRAVACHOL) 40 MG tablet Take 40 mg by mouth every evening.      Review of Systems PER HPI OTHERWISE ALL SYSTEMS ARE NEGATIVE.    Objective:   Physical Exam  Constitutional: She is oriented to person, place, and time. She appears well-developed and well-nourished. No distress.  HENT:  Head: Normocephalic and atraumatic.  Mouth/Throat: Oropharynx is clear and moist. No oropharyngeal exudate.  Eyes: Pupils are equal, round, and reactive to light. No scleral icterus.  Neck: Normal range of motion. Neck supple.  Cardiovascular: Normal rate, regular rhythm and normal heart sounds.   Pulmonary/Chest: Effort normal and breath sounds normal. No respiratory distress.  Abdominal: Soft. Bowel sounds are normal. She exhibits no distension. There is no tenderness.  Musculoskeletal: She exhibits no edema.  Lymphadenopathy:    She has no cervical adenopathy.  Neurological: She is alert and oriented to person, place, and time.  NO  NEW FOCAL DEFICITS  Psychiatric: She has a normal mood and affect.  Vitals reviewed.     Assessment & Plan:

## 2016-07-11 NOTE — Progress Notes (Signed)
CC'ED TO PCP 

## 2016-07-11 NOTE — Assessment & Plan Note (Addendum)
SYMPTOMS NOT CONTROLLED IN AN IMMUNOCOMPROMISED HOST. Recent trip to [place]. DIFFERENTIAL DIAGNOSIS INCLUDES: PARTIALLY TREATED COLLAGENOUS COLITIS LESS LIKELY CRYPTOSPORIDIUM, GIARDIASIS, OR C DIFF COLITIS.  COMPLETE STOOL STUDIES. INCREASE ENTOCORT TO 6 MG DAILY USE BENTYL OR IMODIUM IF NEEDED FOLLOW UP IN 2 MOS.

## 2016-07-11 NOTE — Patient Instructions (Signed)
COMPLETE STOOL STUDIES.  INCREASE ENTOCORT TO 6 MG DAILY  USE BENTYL OR IMODIUM IF NEEDED  FOLLOW UP IN 2 MOS.

## 2016-07-11 NOTE — Progress Notes (Signed)
ON RECALL  °

## 2016-07-12 ENCOUNTER — Ambulatory Visit (HOSPITAL_COMMUNITY)
Admission: RE | Admit: 2016-07-12 | Discharge: 2016-07-12 | Disposition: A | Discharge status: Home / Self Care | Payer: Medicare Other | Source: Ambulatory Visit | Attending: Gastroenterology | Admitting: Gastroenterology

## 2016-07-12 DIAGNOSIS — R740 Nonspecific elevation of levels of transaminase and lactic acid dehydrogenase [LDH]: Secondary | ICD-10-CM | POA: Diagnosis not present

## 2016-07-12 DIAGNOSIS — R7401 Elevation of levels of liver transaminase levels: Secondary | ICD-10-CM

## 2016-07-12 DIAGNOSIS — R932 Abnormal findings on diagnostic imaging of liver and biliary tract: Secondary | ICD-10-CM | POA: Insufficient documentation

## 2016-07-12 NOTE — Progress Notes (Signed)
Pt is aware.  

## 2016-07-16 ENCOUNTER — Ambulatory Visit (HOSPITAL_COMMUNITY)
Admission: RE | Admit: 2016-07-16 | Discharge: 2016-07-16 | Disposition: A | Discharge status: Home / Self Care | Payer: Medicare Other | Source: Ambulatory Visit | Attending: Obstetrics & Gynecology | Admitting: Obstetrics & Gynecology

## 2016-07-16 DIAGNOSIS — Z1231 Encounter for screening mammogram for malignant neoplasm of breast: Secondary | ICD-10-CM | POA: Diagnosis not present

## 2016-07-16 DIAGNOSIS — K52831 Collagenous colitis: Secondary | ICD-10-CM | POA: Diagnosis not present

## 2016-07-17 LAB — FECAL LACTOFERRIN, QUANT: Lactoferrin: POSITIVE

## 2016-07-17 LAB — CLOSTRIDIUM DIFFICILE BY PCR: Toxigenic C. Difficile by PCR: NOT DETECTED

## 2016-07-18 ENCOUNTER — Ambulatory Visit: Payer: Medicare Other | Admitting: Gastroenterology

## 2016-07-20 LAB — GIARDIA/CRYPTOSPORIDIUM (EIA)

## 2016-07-25 ENCOUNTER — Other Ambulatory Visit: Payer: Self-pay | Admitting: Rheumatology

## 2016-07-25 ENCOUNTER — Telehealth: Payer: Self-pay | Admitting: Rheumatology

## 2016-07-25 DIAGNOSIS — Z79899 Other long term (current) drug therapy: Secondary | ICD-10-CM

## 2016-07-25 NOTE — Telephone Encounter (Signed)
Called pt to see which she is using

## 2016-07-25 NOTE — Telephone Encounter (Signed)
She can not take Tizanidine with Flexeril

## 2016-07-25 NOTE — Telephone Encounter (Signed)
I called patient regarding the labs, what is the plan with GI? She states she had normal GB US and has no plans with other doctor to repeat labs  She will come in tomorrow for repeat CMP Can we discuss?

## 2016-07-25 NOTE — Telephone Encounter (Signed)
I spoke to her, she is only using the Tizanidine at bedtime has d/c the Flexeril  I have updated this in the RX  Module

## 2016-07-25 NOTE — Telephone Encounter (Signed)
Patient had to stop Arava due to increased liver enzymes. Would like to know when she can have repeat labs. Patient experiencing increased discomfort.

## 2016-07-25 NOTE — Telephone Encounter (Signed)
05/21/16 last visit  10/18/16 next visit  Will you review?  She is on Flexeril tid  this is for Zanaflex at bedtime  Please advise.

## 2016-07-25 NOTE — Telephone Encounter (Signed)
If her LFTs are still high then we will have to send her to GI eval again.

## 2016-07-27 ENCOUNTER — Other Ambulatory Visit: Payer: Self-pay | Admitting: Radiology

## 2016-07-27 DIAGNOSIS — Z79899 Other long term (current) drug therapy: Secondary | ICD-10-CM | POA: Diagnosis not present

## 2016-07-28 LAB — COMPLETE METABOLIC PANEL WITH GFR
AG Ratio: 1.7 Ratio (ref 1.0–2.5)
ALT: 32 U/L — ABNORMAL HIGH (ref 6–29)
AST: 25 U/L (ref 10–35)
Albumin: 3.9 g/dL (ref 3.6–5.1)
Alkaline Phosphatase: 47 U/L (ref 33–130)
BUN/Creatinine Ratio: 17.2 Ratio (ref 6–22)
BUN: 17 mg/dL (ref 7–25)
CO2: 18 mmol/L — ABNORMAL LOW (ref 20–31)
Calcium: 9.4 mg/dL (ref 8.6–10.4)
Chloride: 106 mmol/L (ref 98–110)
Creat: 0.99 mg/dL (ref 0.50–0.99)
GFR, Est African American: 69 mL/min (ref >=60)
GFR, Est Non African American: 60 mL/min (ref >=60)
Globulin: 2.3 g/dL (ref 1.9–3.7)
Glucose, Bld: 154 mg/dL — ABNORMAL HIGH (ref 65–99)
Potassium: 4.6 mmol/L (ref 3.5–5.3)
Sodium: 136 mmol/L (ref 135–146)
Total Bilirubin: 0.2 mg/dL (ref 0.2–1.2)
Total Protein: 6.2 g/dL (ref 6.1–8.1)

## 2016-07-28 NOTE — Progress Notes (Signed)
LFTs almost back to normal. If she is clinically doing well we do not have to start her on [place]. If she starts flaring then I may restart Arava and monitor labs.

## 2016-07-30 ENCOUNTER — Other Ambulatory Visit: Payer: Self-pay | Admitting: Radiology

## 2016-07-30 MED ORDER — LEFLUNOMIDE 10 MG PO TABS: 10.0000 mg | ORAL_TABLET | ORAL | 0 refills | Status: DC

## 2016-07-30 NOTE — Telephone Encounter (Signed)
Called patient to advise LFTs close to normal Ok to resume Arava 10mg  qod  Left message for her to call me back so I can advise.

## 2016-07-30 NOTE — Telephone Encounter (Signed)
Patient called me back and was advised of the lab results and the plan  She has voiced understanding

## 2016-07-30 NOTE — Progress Notes (Signed)
Arava 10 mg po qod. Repeat labs 1 mth

## 2016-07-30 NOTE — Telephone Encounter (Signed)
-----   Message from Bo Merino, MD sent at 07/30/2016  1:11 PM EDT ----- Arava 10 mg po qod. Repeat labs 1 mth

## 2016-07-31 ENCOUNTER — Encounter: Payer: Self-pay | Admitting: Gastroenterology

## 2016-08-10 ENCOUNTER — Telehealth: Payer: Self-pay | Admitting: Rheumatology

## 2016-08-10 NOTE — Telephone Encounter (Signed)
Patient states she has been off of cymbalta since January (due to having diarrhea) and her GI doctor thinks she should be on something in the place of it. Patient is requesting a call back about this please.

## 2016-08-10 NOTE — Telephone Encounter (Signed)
Patient advised she needs an appointment to discuss her options. Patient transferred to the front to schedule sooner follow up appointment.

## 2016-08-11 NOTE — Progress Notes (Signed)
Office Visit Note  Patient: Tonya Hall             Date of Birth: Jan 12, 1951           MRN: [id number]             PCP: Bo Merino, MD Referring: Bo Merino, MD Visit Date: 08/13/2016 Occupation: @GUAROCC @    Subjective:  Bilateral hands and feet pain  History of Present Illness: Tonya Hall is a 66 y.o. female with history of psoriatic arthritis. We had to stop Arava due to elevation of her LFTs. Her LFTs returned back to normal after stopping Arava. She is back on 10 mg 3 times a week now. She was also suffering from collagenous colitis and was taken off Cymbalta by her gastroenterologist. He recommended using a tricyclic antidepressant for depression and pain. She has noticed increased swelling in her hands and feet and increased pain in her hands and feet.  Activities of Daily Living:  Patient reports morning stiffness for 1 hour.   Patient Denies nocturnal pain.  Difficulty dressing/grooming: Denies Difficulty climbing stairs: Reports Difficulty getting out of chair: Reports Difficulty using hands for taps, buttons, cutlery, and/or writing: Reports   Review of Systems  Constitutional: Positive for fatigue. Negative for night sweats, weight gain, weight loss and weakness.  HENT: Positive for mouth dryness. Negative for mouth sores and nose dryness.   Eyes: Positive for dryness. Negative for pain, redness and visual disturbance.  Respiratory: Negative for cough, shortness of breath and difficulty breathing.   Cardiovascular: Positive for hypertension and swelling in legs/feet. Negative for chest pain, palpitations and irregular heartbeat.  Gastrointestinal: Negative for blood in stool, constipation and diarrhea.  Endocrine: Negative for increased urination.  Genitourinary: Negative for painful urination.  Musculoskeletal: Positive for arthralgias, joint pain, joint swelling, morning stiffness and muscle tenderness (Left thigh tenderness).  Skin:  Positive for rash (folliculitis on back of neck). Negative for color change, nodules/bumps, skin tightness and ulcers.  Allergic/Immunologic: Negative for susceptible to infections.  Neurological: Negative for dizziness, headaches, memory loss and night sweats.  Hematological: Negative for swollen glands.  Psychiatric/Behavioral: Positive for depressed mood. Negative for sleep disturbance. The patient is nervous/anxious.     PMFS History:  Patient Active Problem List   Diagnosis Date Noted  . Transaminitis 06/22/2016  . Collagenous colitis 05/23/2016  . Psoriasis 05/21/2016  . High risk medication use 02/15/2016  . Age-related osteoporosis without current pathological fracture 02/15/2016  . Trochanteric bursitis, left hip 02/15/2016  . Sacroiliitis, not elsewhere classified ([place]) 02/15/2016  . Chronic pain syndrome 02/15/2016  . Abnormal weight gain 06/25/2012  . Hypertension 07/03/2011  . Hypercholesteremia 07/03/2011  . Psoriatic arthritis ([place]) 07/03/2011  . Osteoarthritis of multiple joints 07/03/2011  . Esophageal motility disorder 02/14/2011  . Cough 02/14/2011  . Dysphagia 09/21/2010  . GERD 05/05/2010    Past Medical History:  Diagnosis Date  . Anemia    at times  . Asthma    Albuterol in haler prn  . Cataract    immature unsure which eye  . Chronic back pain   . Degenerative disk disease    psoriatic  . Diabetes mellitus without complication (HCC)    diet controlled  . Esophageal motility disorder    Non-specific, see modified barium study/speech path, BP  . GERD (gastroesophageal reflux disease)    takes Protonix bid  . History of blood transfusion    did have a rash after receiving the blood  .  History of blood transfusion    post c-section  . History of bronchitis    last time early 2014  . HTN (hypertension)    takes Metoprolol daily and SPironolactone daily  . Hypercholesterolemia   . Hyperlipidemia    takes Pravastatin daily  . Hypokalemia   .  Lymphocytic colitis 05/26/10 TCS RMR   Responded to Entocort x 3 MOS  . Monilia infection 12/93  . Neuropathy   . Osteoporosis    gets Boniva every 3 months  . Peripheral edema    takes HCTZ daily  . PONV (postoperative nausea and vomiting)   . Psoriatic arthritis ([place])    Dr. Katherina Right  . Raynaud's disease   . Seasonal allergies   . Shortness of breath    with exertion  . SUI (stress urinary incontinence, female)   . Urinary urgency     Family History  Problem Relation Age of Onset  . Rheum arthritis Mother   . Diabetes Mother   . Pancreatitis Mother   . Rheumatic fever Father   . Diabetes Unknown        grandparents  . Skin cancer Unknown        grandfather  . Hypertension Unknown        grandparent  . Congestive Heart Failure Unknown        grandfather  . Parkinson's disease Unknown        grandmother  . Colon cancer Maternal Uncle   . Colon polyps Neg Hx    Past Surgical History:  Procedure Laterality Date  . BIOPSY  04/17/2016   Procedure: BIOPSY;  Surgeon: Danie Binder, MD;  Location: [facility];  Service: Endoscopy;;  random colon bx's  . CESAREAN SECTION  1974, 1978      . COLONOSCOPY  06/19/2008   HUD:JSHFW internal hemorrhoids/mild sigmoin colon diverticulosis  . COLONOSCOPY  2012   Dr. Gala Romney: normal rectum, diverticula, lymphocytic colitis   . COLONOSCOPY WITH PROPOFOL N/A 04/17/2016   Procedure: COLONOSCOPY WITH PROPOFOL;  Surgeon: Danie Binder, MD;  Location: [facility];  Service: Endoscopy;  Laterality: N/A;  900  . DILATION AND CURETTAGE OF UTERUS  1977   spontaneous abortion  . ESOPHAGOGASTRODUODENOSCOPY  06/19/2008   YOV:ZCHYIFOY gastritis  . LUMBAR DISC ARTHROPLASTY  7/98  . LUMBAR FUSION  6/03, 11/08, 11/14   L4-5 fusion, L2-3 fusion, L1-2  . TONSILLECTOMY AND ADENOIDECTOMY    . TUBAL LIGATION     Social History   Social History Narrative  . No narrative on file     Objective: Vital Signs: BP (!) 155/74   Pulse 72   Resp 14    Ht 4\' 11"  (1.499 m)   Wt 141 lb (64 kg)   LMP 02/01/1999   BMI 28.48 kg/m    Physical Exam  Constitutional: She is oriented to person, place, and time. She appears well-developed and well-nourished.  HENT:  Head: Normocephalic and atraumatic.  Eyes: Conjunctivae and EOM are normal.  Neck: Normal range of motion.  Cardiovascular: Normal rate, regular rhythm, normal heart sounds and intact distal pulses.   Pulmonary/Chest: Effort normal and breath sounds normal.  Abdominal: Soft. Bowel sounds are normal.  Lymphadenopathy:    She has no cervical adenopathy.  Neurological: She is alert and oriented to person, place, and time.  Skin: Skin is warm and dry. Capillary refill takes less than 2 seconds.  Psychiatric: She has a normal mood and affect. Her behavior is normal.  Nursing  note and vitals reviewed.    Musculoskeletal Exam: C-spine and thoracic spine good range of motion she has thoracic kyphosis. She is tenderness over left SI joint. Shoulder joints elbow joints MCPs with good range of motion. She has severe inflammatory arthritis of bilateral PIP/DIP joints consistent with psoriatic arthritis. Hip joints knee joints ankles with good range of motion. She is some tenderness across her MTPs and PIPs but no synovitis was noted. CDAI Exam: CDAI Homunculus Exam:   Tenderness:  Right hand: 2nd PIP, 3rd PIP, 4th PIP, 5th PIP, 2nd DIP, 3rd DIP, 4th DIP and 5th DIP Left hand: 2nd PIP, 3rd PIP, 4th PIP, 5th PIP, 2nd DIP, 3rd DIP, 4th DIP and 5th DIP Right foot: 2nd MTP, 2nd PIP and 3rd PIP Left foot: 1st MTP, 2nd MTP, 2nd PIP, 3rd PIP and 4th PIP  Swelling:  Right hand: 2nd PIP, 3rd PIP, 4th PIP, 5th PIP, 2nd DIP, 3rd DIP, 4th DIP and 5th DIP Left hand: 2nd PIP, 3rd PIP, 4th PIP, 5th PIP, 2nd DIP, 3rd DIP, 4th DIP and 5th DIP  Joint Counts:  CDAI Tender Joint count: 8 CDAI Swollen Joint count: 8  Global Assessments:  Patient Global Assessment: 5 Provider Global Assessment:  5  CDAI Calculated Score: 26    Investigation: No additional findings. CBC    Component Value Date/Time   WBC 12.2 (H) 06/21/2016 1219   RBC 4.15 06/21/2016 1219   HGB 12.9 06/21/2016 1219   HCT 39.7 06/21/2016 1219   PLT 253 06/21/2016 1219   MCV 95.7 06/21/2016 1219   MCH 31.1 06/21/2016 1219   MCHC 32.5 06/21/2016 1219   RDW 14.3 06/21/2016 1219   LYMPHSABS 1,464 06/21/2016 1219   MONOABS 610 06/21/2016 1219   EOSABS 366 06/21/2016 1219   BASOSABS 122 06/21/2016 1219    CMP     Component Value Date/Time   NA 136 07/27/2016 1350   K 4.6 07/27/2016 1350   CL 106 07/27/2016 1350   CO2 18 (L) 07/27/2016 1350   GLUCOSE 154 (H) 07/27/2016 1350   BUN 17 07/27/2016 1350   CREATININE 0.99 07/27/2016 1350   CALCIUM 9.4 07/27/2016 1350   PROT 6.2 07/27/2016 1350   ALBUMIN 3.9 07/27/2016 1350   AST 25 07/27/2016 1350   ALT 32 (H) 07/27/2016 1350   ALKPHOS 47 07/27/2016 1350   BILITOT 0.2 07/27/2016 1350   GFRNONAA 60 07/27/2016 1350   GFRAA 69 07/27/2016 1350   Imaging: Mm Screening Breast Tomo Bilateral  Result Date: 07/17/2016 CLINICAL DATA:  Screening. EXAM: 2D DIGITAL SCREENING BILATERAL MAMMOGRAM WITH CAD AND ADJUNCT TOMO COMPARISON:  Previous exam(s). ACR Breast Density Category b: There are scattered areas of fibroglandular density. FINDINGS: There are no findings suspicious for malignancy. Images were processed with CAD. IMPRESSION: No mammographic evidence of malignancy. A result letter of this screening mammogram will be mailed directly to the patient. RECOMMENDATION: Screening mammogram in one year. (Code:SM-B-01Y) BI-RADS CATEGORY  1: Negative. Electronically Signed   By: Margarette Canada M.D.   On: 07/17/2016 07:47    Speciality Comments: No specialty comments available.    Procedures:  No procedures performed Allergies: Adalimumab; Imuran [azathioprine sodium]; Methotrexate; Plaquenil [hydroxychloroquine sulfate]; Sulfonamide derivatives; Ceftin [cefuroxime  axetil]; and Morphine   Assessment / Plan:     Visit Diagnoses: Psoriatic arthritis ([place])  Psoriasis  High risk medication use - Cosyntex 300 mg every month, Arava, prednisone (MTX-elevated LFTs,Humira side effects, inadequate response to Plaquenil and Remicade) - Plan: Comprehensive metabolic panel,  CBC with Differential/Platelet  Sacroiliitis, not elsewhere classified (HCC)  Pain in both hands  Chronic pain syndrome  Essential hypertension  Hypercholesteremia  Collagenous colitis  High risk medication use - Cosentyx 300 mg every month (Adeq);// Arava 20mg  qd // Pred 5mg  qd //[Pt has had inadequate response to Plaquenil and Remicade.] - Plan: Comprehensive metabolic panel, CBC with Differential/Platelet    Orders: No orders of the defined types were placed in this encounter.  Meds ordered this encounter  Medications  . amitriptyline (ELAVIL) 25 MG tablet    Sig: Take 1 tablet (25 mg total) by mouth at bedtime.    Dispense:  30 tablet    Refill:  2    Face-to-face time spent with patient was 30 minutes. 50% of time was spent in counseling and coordination of care.  Follow-Up Instructions: Return for Psoriatic arthritis.   Bo Merino, MD  Note - This record has been created using Editor, commissioning.  Chart creation errors have been sought, but may not always  have been located. Such creation errors do not reflect on  the standard of medical care.

## 2016-08-13 ENCOUNTER — Encounter: Payer: Self-pay | Admitting: Gastroenterology

## 2016-08-13 ENCOUNTER — Telehealth: Payer: Self-pay | Admitting: Rheumatology

## 2016-08-13 ENCOUNTER — Encounter: Payer: Self-pay | Admitting: Rheumatology

## 2016-08-13 ENCOUNTER — Telehealth: Payer: Self-pay | Admitting: Pharmacist

## 2016-08-13 ENCOUNTER — Ambulatory Visit (INDEPENDENT_AMBULATORY_CARE_PROVIDER_SITE_OTHER): Payer: Medicare Other | Admitting: Rheumatology

## 2016-08-13 VITALS — BP 155/74 | HR 72 | Resp 14 | Ht 59.0 in | Wt 141.0 lb

## 2016-08-13 DIAGNOSIS — K52831 Collagenous colitis: Secondary | ICD-10-CM | POA: Diagnosis not present

## 2016-08-13 DIAGNOSIS — M79641 Pain in right hand: Secondary | ICD-10-CM | POA: Diagnosis not present

## 2016-08-13 DIAGNOSIS — G894 Chronic pain syndrome: Secondary | ICD-10-CM

## 2016-08-13 DIAGNOSIS — M461 Sacroiliitis, not elsewhere classified: Secondary | ICD-10-CM | POA: Diagnosis not present

## 2016-08-13 DIAGNOSIS — Z79899 Other long term (current) drug therapy: Secondary | ICD-10-CM | POA: Diagnosis not present

## 2016-08-13 DIAGNOSIS — L409 Psoriasis, unspecified: Secondary | ICD-10-CM

## 2016-08-13 DIAGNOSIS — E7800 Pure hypercholesterolemia, unspecified: Secondary | ICD-10-CM

## 2016-08-13 DIAGNOSIS — M79642 Pain in left hand: Secondary | ICD-10-CM

## 2016-08-13 DIAGNOSIS — L4050 Arthropathic psoriasis, unspecified: Secondary | ICD-10-CM | POA: Diagnosis not present

## 2016-08-13 DIAGNOSIS — I10 Essential (primary) hypertension: Secondary | ICD-10-CM

## 2016-08-13 MED ORDER — AMITRIPTYLINE HCL 25 MG PO TABS: 25.0000 mg | ORAL_TABLET | Freq: Every day | ORAL | 2 refills | Status: DC

## 2016-08-13 NOTE — Telephone Encounter (Signed)
Patient states Amitriptyline was called into [facility], and she no longer uses this Pharmacy. Request meds be called into [facility] in [place]. Please remove [facility] from patient pharmacy list, and call patient when rx called in.

## 2016-08-13 NOTE — Telephone Encounter (Signed)
Left message to advise patient prescription has been sent to [facility].

## 2016-08-13 NOTE — Patient Instructions (Signed)
Amitriptyline tablets What is this medicine? AMITRIPTYLINE (a mee TRIP ti leen) is used to treat depression. This medicine may be used for other purposes; ask your health care provider or pharmacist if you have questions. COMMON BRAND NAME(S): Elavil, Vanatrip What should I tell my health care provider before I take this medicine? They need to know if you have any of these conditions: -an alcohol problem -asthma, difficulty breathing -bipolar disorder or schizophrenia -difficulty passing urine, prostate trouble -glaucoma -heart disease or previous heart attack -liver disease -over active thyroid -seizures -thoughts or plans of suicide, a previous suicide attempt, or family history of suicide attempt -an unusual or allergic reaction to amitriptyline, other medicines, foods, dyes, or preservatives -pregnant or trying to get pregnant -breast-feeding How should I use this medicine? Take this medicine by mouth with a drink of water. Follow the directions on the prescription label. You can take the tablets with or without food. Take your medicine at regular intervals. Do not take it more often than directed. Do not stop taking this medicine suddenly except upon the advice of your doctor. Stopping this medicine too quickly may cause serious side effects or your condition may worsen. A special MedGuide will be given to you by the pharmacist with each prescription and refill. Be sure to read this information carefully each time. Talk to your pediatrician regarding the use of this medicine in children. Special care may be needed. Overdosage: If you think you have taken too much of this medicine contact a poison control center or emergency room at once. NOTE: This medicine is only for you. Do not share this medicine with others. What if I miss a dose? If you miss a dose, take it as soon as you can. If it is almost time for your next dose, take only that dose. Do not take double or extra doses. What  may interact with this medicine? Do not take this medicine with any of the following medications: -arsenic trioxide -certain medicines used to regulate abnormal heartbeat or to treat other heart conditions -cisapride -droperidol -halofantrine -linezolid -MAOIs like Carbex, Eldepryl, Marplan, Nardil, and Parnate -methylene blue -other medicines for mental depression -phenothiazines like perphenazine, thioridazine and chlorpromazine -pimozide -probucol -procarbazine -sparfloxacin -St. John's Wort -ziprasidone This medicine may also interact with the following medications: -atropine and related drugs like hyoscyamine, scopolamine, tolterodine and others -barbiturate medicines for inducing sleep or treating seizures, like phenobarbital -cimetidine -disulfiram -ethchlorvynol -thyroid hormones such as levothyroxine This list may not describe all possible interactions. Give your health care provider a list of all the medicines, herbs, non-prescription drugs, or dietary supplements you use. Also tell them if you smoke, drink alcohol, or use illegal drugs. Some items may interact with your medicine. What should I watch for while using this medicine? Tell your doctor if your symptoms do not get better or if they get worse. Visit your doctor or health care professional for regular checks on your progress. Because it may take several weeks to see the full effects of this medicine, it is important to continue your treatment as prescribed by your doctor. Patients and their families should watch out for new or worsening thoughts of suicide or depression. Also watch out for sudden changes in feelings such as feeling anxious, agitated, panicky, irritable, hostile, aggressive, impulsive, severely restless, overly excited and hyperactive, or not being able to sleep. If this happens, especially at the beginning of treatment or after a change in dose, call your health care professional. You may get   drowsy or  dizzy. Do not drive, use machinery, or do anything that needs mental alertness until you know how this medicine affects you. Do not stand or sit up quickly, especially if you are an older patient. This reduces the risk of dizzy or fainting spells. Alcohol may interfere with the effect of this medicine. Avoid alcoholic drinks. Do not treat yourself for coughs, colds, or allergies without asking your doctor or health care professional for advice. Some ingredients can increase possible side effects. Your mouth may get dry. Chewing sugarless gum or sucking hard candy, and drinking plenty of water will help. Contact your doctor if the problem does not go away or is severe. This medicine may cause dry eyes and blurred vision. If you wear contact lenses you may feel some discomfort. Lubricating drops may help. See your eye doctor if the problem does not go away or is severe. This medicine can cause constipation. Try to have a bowel movement at least every 2 to 3 days. If you do not have a bowel movement for 3 days, call your doctor or health care professional. This medicine can make you more sensitive to the sun. Keep out of the sun. If you cannot avoid being in the sun, wear protective clothing and use sunscreen. Do not use sun lamps or tanning beds/booths. What side effects may I notice from receiving this medicine? Side effects that you should report to your doctor or health care professional as soon as possible: -allergic reactions like skin rash, itching or hives, swelling of the face, lips, or tongue -anxious -breathing problems -changes in vision -confusion -elevated mood, decreased need for sleep, racing thoughts, impulsive behavior -eye pain -fast, irregular heartbeat -feeling faint or lightheaded, falls -feeling agitated, angry, or irritable -fever with increased sweating -hallucination, loss of contact with reality -seizures -stiff muscles -suicidal thoughts or other mood  changes -tingling, pain, or numbness in the feet or hands -trouble passing urine or change in the amount of urine -trouble sleeping -unusually weak or tired -vomiting -yellowing of the eyes or skin Side effects that usually do not require medical attention (report to your doctor or health care professional if they continue or are bothersome): -change in sex drive or performance -change in appetite or weight -constipation -dizziness -dry mouth -nausea -tired -tremors -upset stomach This list may not describe all possible side effects. Call your doctor for medical advice about side effects. You may report side effects to FDA at 1-800-FDA-1088. Where should I keep my medicine? Keep out of the reach of children. Store at room temperature between 20 and 25 degrees C (68 and 77 degrees F). Throw away any unused medicine after the expiration date. NOTE: This sheet is a summary. It may not cover all possible information. If you have questions about this medicine, talk to your doctor, pharmacist, or health care provider.  2018 Elsevier/Gold Standard (2015-08-19 12:14:15)  

## 2016-08-13 NOTE — Progress Notes (Signed)
Rheumatology Medication Review by a Pharmacist Does the patient feel that his/her medications are working for him/her?  No - patient reports increased pain in her hands and feet Has the patient been experiencing any side effects to the medications prescribed?  No Does the patient have any problems obtaining medications?  No  Issues to address at subsequent visits: None   Pharmacist comments:  Tonya Hall is a pleasant 66 yo F who presents for follow up.  She is currently taking Cosentyx 300 mg every month, leflunomide 10 mg every other day and prednisone 5 mg daily.  She was previously on leflunomide 20 mg daily but medication was discontinued due to elevations in her liver enzymes.  Patient reports her cholesterol medication was also stopped due to the elevated liver enzymes.  Patient had repeat CMP on 07/27/16 with significant improvement in her liver enzymes.  Leflunomide was restarted at 10 mg every other day and patient was advised to get repeat labs again in 1 month.  Most recent TB Gold was negative on 06/21/16.  She will be due for TB Gold again in March 2019.  Patient denies any questions or concerns regarding her medications at this time.   Patient was previously on duloxetine which was discontinued due to her collagenous colitis.  GI recommended use of tricyclic antidepressant if patient has recurrent nerve pain.  Patient was prescribed amitriptyline during today's visit.  I counseled patient on the purpose, proper use, and adverse effects of amitriptyline.  Advised patient to take the medication at bedtime.  Reviewed black boxed warning of increased risk of suicidal thoughts in young adults.  Provided patient with educational materials on amitriptyline and answered all questions.  Noted there is a drug interaction with phenylephrine.  Advised patient to avoid use of phenylephrine.  Patient voiced understanding and denied any further questions.    Elisabeth Most, Pharm.D., BCPS, CPP Clinical  Pharmacist Pager: [phone] Phone: [phone] 08/13/2016 11:00 AM

## 2016-08-13 NOTE — Telephone Encounter (Signed)
Received a prior authorization request for amitriptyline.  PA was completed and faxed to patient's insurance company.  I informed patient and let her know we will continue to update her once we know status of PA.  Elisabeth Most, Pharm.D., BCPS, CPP Clinical Pharmacist Pager: [phone] Phone: [phone] 08/13/2016 4:49 PM

## 2016-08-14 ENCOUNTER — Telehealth: Payer: Self-pay | Admitting: Pharmacist

## 2016-08-14 LAB — COMPREHENSIVE METABOLIC PANEL
ALT: 30 U/L — ABNORMAL HIGH (ref 6–29)
AST: 25 U/L (ref 10–35)
Albumin: 4.1 g/dL (ref 3.6–5.1)
Alkaline Phosphatase: 40 U/L (ref 33–130)
BUN: 16 mg/dL (ref 7–25)
CO2: 19 mmol/L — ABNORMAL LOW (ref 20–31)
Calcium: 9.6 mg/dL (ref 8.6–10.4)
Chloride: 107 mmol/L (ref 98–110)
Creat: 0.97 mg/dL (ref 0.50–0.99)
Glucose, Bld: 111 mg/dL — ABNORMAL HIGH (ref 65–99)
Potassium: 4.2 mmol/L (ref 3.5–5.3)
Sodium: 139 mmol/L (ref 135–146)
Total Bilirubin: 0.2 mg/dL (ref 0.2–1.2)
Total Protein: 6.5 g/dL (ref 6.1–8.1)

## 2016-08-14 LAB — CBC WITH DIFFERENTIAL/PLATELET
Basophils Absolute: 126 cells/uL (ref 0–200)
Basophils Relative: 1 %
Eosinophils Absolute: 504 cells/uL — ABNORMAL HIGH (ref 15–500)
Eosinophils Relative: 4 %
HCT: 39.4 % (ref 35.0–45.0)
Hemoglobin: 13 g/dL (ref 11.7–15.5)
Lymphocytes Relative: 23 %
Lymphs Abs: 2898 cells/uL (ref 850–3900)
MCH: 31.2 pg (ref 27.0–33.0)
MCHC: 33 g/dL (ref 32.0–36.0)
MCV: 94.5 fL (ref 80.0–100.0)
MPV: 11.7 fL (ref 7.5–12.5)
Monocytes Absolute: 882 cells/uL (ref 200–950)
Monocytes Relative: 7 %
Neutro Abs: 8190 cells/uL — ABNORMAL HIGH (ref 1500–7800)
Neutrophils Relative %: 65 %
Platelets: 245 10*3/uL (ref 140–400)
RBC: 4.17 MIL/uL (ref 3.80–5.10)
RDW: 13.7 % (ref 11.0–15.0)
WBC: 12.6 10*3/uL — ABNORMAL HIGH (ref 3.8–10.8)

## 2016-08-14 MED ORDER — NORTRIPTYLINE HCL 25 MG PO CAPS: 25.0000 mg | ORAL_CAPSULE | Freq: Every day | ORAL | 2 refills | Status: DC

## 2016-08-14 NOTE — Telephone Encounter (Signed)
I called patient and advised the amitriptyline was denied.  We will send in prescription for nortriptyline instead.  Counseled patient on purpose, proper use, and adverse effects of nortriptyline.  Patient voiced understanding and denies any questions.  I spoke to [place] at [facility] and advised him to cancel the amitriptyline prescription.  The nortriptyline prescription was processed and confirmed that no prior authorization was required.    Elisabeth Most, Pharm.D., BCPS, CPP Clinical Pharmacist Pager: [phone] Phone: [phone] 08/14/2016 2:30 PM

## 2016-08-14 NOTE — Telephone Encounter (Signed)
Amitriptyline was denied by patient's insurance.  The preferred tricyclic antidepressants are nortriptyline or desipramine.   Okay to change prescription from amitriptyline to nortriptyline?   Elisabeth Most, Pharm.D., BCPS, CPP Clinical Pharmacist Pager: [phone] Phone: [phone] 08/14/2016 1:44 PM

## 2016-08-14 NOTE — Telephone Encounter (Signed)
Nortriptyline 25 mg by mouth daily at bedtime total 30 with 2 refills

## 2016-08-14 NOTE — Progress Notes (Signed)
Labs are stable, continue current tt

## 2016-08-21 ENCOUNTER — Ambulatory Visit: Payer: Medicare Other | Admitting: Obstetrics & Gynecology

## 2016-08-23 ENCOUNTER — Other Ambulatory Visit: Payer: Self-pay | Admitting: Rheumatology

## 2016-08-23 MED ORDER — HYDROCODONE-ACETAMINOPHEN 5-325 MG PO TABS: ORAL_TABLET | ORAL | 0 refills | Status: DC

## 2016-08-23 NOTE — Telephone Encounter (Signed)
Patient request refill on her Hydrocodone. Call patient when ready to pick up.

## 2016-08-23 NOTE — Telephone Encounter (Signed)
Okay to refill hydrocodone (do same prescription has done previously)

## 2016-08-23 NOTE — Progress Notes (Signed)
REVIEWED-NO ADDITIONAL RECOMMENDATIONS. 

## 2016-08-23 NOTE — Telephone Encounter (Signed)
Please print prescription

## 2016-08-23 NOTE — Telephone Encounter (Signed)
Last Visit: 05/21/16 Next Visit: 10/18/16 UDS:03/14/16 Narc Agreement: 12/30/15  Okay to refill Hydrocodone?

## 2016-09-01 ENCOUNTER — Other Ambulatory Visit: Payer: Self-pay | Admitting: Internal Medicine

## 2016-09-01 ENCOUNTER — Ambulatory Visit (HOSPITAL_COMMUNITY)
Admission: RE | Admit: 2016-09-01 | Discharge: 2016-09-01 | Disposition: A | Discharge status: Home / Self Care | Payer: Medicare Other | Source: Ambulatory Visit | Attending: Internal Medicine | Admitting: Internal Medicine

## 2016-09-01 ENCOUNTER — Other Ambulatory Visit (HOSPITAL_COMMUNITY): Payer: Self-pay | Admitting: Internal Medicine

## 2016-09-01 DIAGNOSIS — R05 Cough: Secondary | ICD-10-CM

## 2016-09-01 DIAGNOSIS — J209 Acute bronchitis, unspecified: Secondary | ICD-10-CM | POA: Diagnosis not present

## 2016-09-01 DIAGNOSIS — R0602 Shortness of breath: Secondary | ICD-10-CM | POA: Diagnosis not present

## 2016-09-01 DIAGNOSIS — R062 Wheezing: Secondary | ICD-10-CM

## 2016-09-01 DIAGNOSIS — R059 Cough, unspecified: Secondary | ICD-10-CM

## 2016-09-05 ENCOUNTER — Telehealth: Payer: Self-pay

## 2016-09-05 ENCOUNTER — Telehealth: Payer: Self-pay | Admitting: Rheumatology

## 2016-09-05 ENCOUNTER — Encounter: Payer: Self-pay | Admitting: Gastroenterology

## 2016-09-05 ENCOUNTER — Ambulatory Visit (INDEPENDENT_AMBULATORY_CARE_PROVIDER_SITE_OTHER): Payer: Medicare Other | Admitting: Gastroenterology

## 2016-09-05 DIAGNOSIS — K52831 Collagenous colitis: Secondary | ICD-10-CM | POA: Diagnosis not present

## 2016-09-05 DIAGNOSIS — R7401 Elevation of levels of liver transaminase levels: Secondary | ICD-10-CM

## 2016-09-05 DIAGNOSIS — K224 Dyskinesia of esophagus: Secondary | ICD-10-CM

## 2016-09-05 DIAGNOSIS — K219 Gastro-esophageal reflux disease without esophagitis: Secondary | ICD-10-CM | POA: Diagnosis not present

## 2016-09-05 DIAGNOSIS — R740 Nonspecific elevation of levels of transaminase and lactic acid dehydrogenase [LDH]: Secondary | ICD-10-CM | POA: Diagnosis not present

## 2016-09-05 DIAGNOSIS — Z79899 Other long term (current) drug therapy: Secondary | ICD-10-CM

## 2016-09-05 NOTE — Telephone Encounter (Signed)
Ok to increase Arava to 10 mg po qd . Check CMP in 3 weeks.

## 2016-09-05 NOTE — Assessment & Plan Note (Signed)
SYMPTOMS FAIRLY WELL CONTROLLED.  CONTINUE TO MONITOR SYMPTOMS. 

## 2016-09-05 NOTE — Progress Notes (Signed)
Subjective:   Bo Merino, MD    Patient ID: Tonya Hall, female    DOB: Aug 12, 1950, 66 y.o.   MRN: [id number]   Pt has been TAKING ONLY ONE PILL SINCE MAR 2018. AND ABOUT 2 WEEKS AFTER LAST VISIT YOUR STOOL STARTED FIRM UP. BMs: 1-2X/DAY. CURRENTLY BEING TREATED FOR BRONCHITIS. LIVER ENZYMES WENT UP ON ARAVA 20 MG DAILY. NOW BACK DOWN TO NORMAL. RE-STARTED BACK ARAVA 10 MG EVERY OTHER DAY. NOW OFF HMG CoA REDUCTASE INHIBITOR. FEELS A LITTLE SOB FOR 2 WEEKS. COUGHING NOW BETTER.TOOK DOSE PAK/Z-PAK.  [facility] ANOTHER DOSE PAK/AMOXICILLIN. SEVERE PAIN IN LEFT RIBS SINCE THIS WEEKEND. HASS APPT WITH DR. D IN JUN 2018. TAKEN OFF CMYBALTA AND ALL OF A SUDDEN STARTED CRYING AND THINGS. STARTED ON NORTRIPTYLINE AND IT IMMEDIATELY HELPED FOR DEPRESSION AND PAIN IN HER LEG.  PT DENIES FEVER, CHILLS, HEMATOCHEZIA, HEMATEMESIS, nausea, vomiting, melena, diarrhea, CHEST PAIN,  CHANGE IN BOWEL IN HABITS, constipation, abdominal pain, problems swallowing, OR heartburn or indigestion.  Past Medical History:  Diagnosis Date  . Anemia    at times  . Asthma    Albuterol in haler prn  . Cataract    immature unsure which eye  . Chronic back pain   . Degenerative disk disease    psoriatic  . Diabetes mellitus without complication (HCC)    diet controlled  . Esophageal motility disorder    Non-specific, see modified barium study/speech path, BP  . GERD (gastroesophageal reflux disease)    takes Protonix bid  . History of blood transfusion    did have a rash after receiving the blood  . History of blood transfusion    post c-section  . History of bronchitis    last time early 2014  . HTN (hypertension)    takes Metoprolol daily and SPironolactone daily  . Hypercholesterolemia   . Hyperlipidemia    takes Pravastatin daily  . Hypokalemia   . Lymphocytic colitis 05/26/10 TCS RMR   Responded to Entocort x 3 MOS  . Monilia infection 12/93  . Neuropathy   . Osteoporosis    gets  Boniva every 3 months  . Peripheral edema    takes HCTZ daily  . PONV (postoperative nausea and vomiting)   . Psoriatic arthritis ([place])    Dr. Katherina Right  . Raynaud's disease   . Seasonal allergies   . Shortness of breath    with exertion  . SUI (stress urinary incontinence, female)   . Urinary urgency    Past Surgical History:  Procedure Laterality Date  . BIOPSY  04/17/2016   Procedure: BIOPSY;  Surgeon: Danie Binder, MD;  Location: [facility];  Service: Endoscopy;;  random colon bx's  . CESAREAN SECTION  1974, 1978      . COLONOSCOPY  06/19/2008   FVC:BSWHQ internal hemorrhoids/mild sigmoin colon diverticulosis  . COLONOSCOPY  2012   Dr. Gala Romney: normal rectum, diverticula, lymphocytic colitis   . COLONOSCOPY WITH PROPOFOL N/A 04/17/2016   Procedure: COLONOSCOPY WITH PROPOFOL;  Surgeon: Danie Binder, MD;  Location: [facility];  Service: Endoscopy;  Laterality: N/A;  900  . DILATION AND CURETTAGE OF UTERUS  1977   spontaneous abortion  . ESOPHAGOGASTRODUODENOSCOPY  06/19/2008   PRF:[id number] gastritis  . LUMBAR DISC ARTHROPLASTY  7/98  . LUMBAR FUSION  6/03, 11/08, 11/14   L4-5 fusion, L2-3 fusion, L1-2  . TONSILLECTOMY AND ADENOIDECTOMY    . TUBAL LIGATION     Allergies  Allergen  Reactions  . Adalimumab Rash and Other (See Comments)    FEVER, Fast pulse  . Imuran [Azathioprine Sodium] Rash    Denies Airway involvement  . Methotrexate Other (See Comments)    Liver Enzyme elevation - after 1 month of use  . Plaquenil [Hydroxychloroquine Sulfate] Rash    Denies airway involvement.   . Sulfonamide Derivatives Rash    Occurred with Sulfasalazine. Rash only.   . Ceftin [Cefuroxime Axetil] Nausea Only    Upset stomach  . Morphine Nausea And Vomiting    IV morphine caused N and V  After surgery.   Has tolerated Kadian in the past    Current Outpatient Prescriptions  Medication Sig Dispense Refill  . albuterol (PROVENTIL HFA;VENTOLIN HFA) 108 (90 BASE) MCG/ACT  inhaler Inhale 2 puffs into the lungs every 6 (six) hours as needed for wheezing or shortness of breath.     Marland Kitchen amoxicillin-clavulanate (AUGMENTIN) 875-125 MG tablet Take 1 tablet by mouth 2 (two) times daily.    Marland Kitchen aspirin 81 MG tablet Take 81 mg by mouth daily.      . budesonide (ENTOCORT EC) 3 MG 24 hr capsule Take 3 mg by mouth daily.     . Calcium Carb-Cholecalciferol (CALCIUM 600 + D PO) Take 1 tablet by mouth 2 (two) times daily.    . cetirizine (ZYRTEC) 10 MG tablet Take 10 mg by mouth daily as needed for allergies.    Hillary Bow SENSOREADY 300 DOSE 150 MG/ML SOAJ Inject 2 pens into the skin every 30 (thirty) days. Reported on 08/12/2015    . diclofenac sodium (VOLTAREN) 1 % GEL Apply 2 g topically 3 (three) times daily as needed (pain).     Marland Kitchen dicyclomine (BENTYL) 10 MG capsule Take 1 capsule (10 mg total) by mouth 4 (four) times daily -  before meals and at bedtime. (Patient taking differently: Take 10 mg by mouth as needed. ) LAST DOSE > 1 MO   . fish oil-omega-3 fatty acids 1000 MG capsule Take 2 g by mouth 2 (two) times daily.     . fluticasone (FLONASE) 50 MCG/ACT nasal spray Place 2 sprays into both nostrils daily.    . Ginger, Zingiber officinalis, (GINGER ROOT) 500 MG CAPS Take 1 capsule by mouth daily.     . Glucosamine Sulfate 1000 MG CAPS Take 2 capsules (2,000 mg total) by mouth daily.    Marland Kitchen guaiFENesin-dextromethorphan (ROBITUSSIN DM) 100-10 MG/5ML syrup Take 10 mLs by mouth every 6 (six) hours as needed for cough.    Marland Kitchen HUMULIN N KWIKPEN 100 UNIT/ML Kiwkpen Inject 10 Units into the skin at bedtime.    Marland Kitchen HYDROcodone-acetaminophen (NORCO/VICODIN) 5-325 MG tablet Take 1 tablet by by 2-3 times daily as needed for pain    .      Marland Kitchen Hypromellose (ARTIFICIAL TEARS OP) Apply 1 drop to eye daily as needed (dry eyes).    Marland Kitchen leflunomide (ARAVA) 10 MG tablet Take 1 tablet (10 mg total) by mouth every other day.    . metoprolol succinate (TOPROL-XL) 50 MG 24 hr tablet Take 1 tablet (50 mg total)  by mouth daily. Take with or immediately following a meal.    . Misc Natural Products (TART CHERRY ADVANCED PO) Take 1 tablet by mouth daily. daily     . Multiple Vitamin (MULTIVITAMIN) tablet Take 1 tablet by mouth daily.      . nortriptyline (PAMELOR) 25 MG capsule Take 1 capsule (25 mg total) by mouth at bedtime.    Marland Kitchen  omeprazole (PRILOSEC) 20 MG capsule Take 1 capsule (20 mg total) by mouth 2 (two) times daily before a meal.    . potassium chloride SA (K-DUR,KLOR-CON) 20 MEQ tablet Take 20 mEq by mouth 2 (two) times daily.    . predniSONE (DELTASONE) 5 MG tablet TAKE 1 TABLET BY MOUTH EVERY MORNING.    . predniSONE (STERAPRED UNI-PAK 21 TAB) 10 MG (21) TBPK tablet Take 1 tablet by mouth as directed.    Marland Kitchen PROBIOTIC CAPS Take 1 capsule by mouth daily.    . RESTASIS MULTIDOSE 0.05 % ophthalmic emulsion Place 1 drop into both eyes 2 (two) times daily.     . sodium chloride (OCEAN) 0.65 % SOLN nasal spray Place 1 spray into both nostrils as needed for congestion.    Marland Kitchen spironolactone (ALDACTONE) 25 MG tablet Take 25 mg by mouth daily.     . SURE COMFORT PEN NEEDLES 31G X 5 MM MISC     . tiZANidine (ZANAFLEX) 4 MG tablet TAKE (1) TABLET BY MOUTH AT BEDTIME.    Marland Kitchen topiramate (TOPAMAX) 200 MG tablet TAKE (1) TABLET BY MOUTH AT BEDTIME.    . Turmeric 500 MG CAPS Take 1 capsule by mouth daily.     . Zoledronic Acid (RECLAST IV) Inject into the vein.     Marland Kitchen b complex vitamins capsule Take 1 capsule by mouth daily.    .      . loperamide (IMODIUM A-D) 2 MG tablet Take 2-4 mg by mouth 2 (two) times daily as needed for diarrhea or loose stools.    Marland Kitchen       HPI  Review of Systems PER HPI OTHERWISE ALL SYSTEMS ARE NEGATIVE.    Objective:   Physical Exam  Constitutional: She is oriented to person, place, and time. She appears well-developed and well-nourished. No distress.  HENT:  Head: Normocephalic and atraumatic.  Mouth/Throat: Oropharynx is clear and moist. No oropharyngeal exudate.  Eyes: Pupils  are equal, round, and reactive to light. No scleral icterus.  Neck: Normal range of motion. Neck supple.  Cardiovascular: Normal rate, regular rhythm and normal heart sounds.   Pulmonary/Chest: Effort normal and breath sounds normal. No respiratory distress.  Abdominal: Soft. Bowel sounds are normal. She exhibits no distension. There is no tenderness.  Musculoskeletal: She exhibits no edema.  DEFORMITES OF HAND JOINTS BILATERALLY.  Lymphadenopathy:    She has no cervical adenopathy.  Neurological: She is alert and oriented to person, place, and time.  Psychiatric: She has a normal mood and affect.  Vitals reviewed.     Assessment & Plan:

## 2016-09-05 NOTE — Telephone Encounter (Signed)
Patient called this morning stating that Dr. Estanislado Pandy reduced her Tonya Hall to 10mg  every other day because of her liver enzymes being elevated. She stated that her right hand is hurting bad and can hardly use it.  She is wanting to know if she can take the 10mg  every day since her liver enzymes are good.  CB#[phone].  Thank you.

## 2016-09-05 NOTE — Assessment & Plan Note (Signed)
SYMPTOMS CONTROLLED/RESOLVED.  CONTINUE OMEPRAZOLE.  TAKE 30 MINUTES PRIOR TO YOUR MEALS ONCE OR TWICE DAILY. FOLLOW UP IN 4 MOS.

## 2016-09-05 NOTE — Assessment & Plan Note (Signed)
SYMPTOMS CONTROLLED/RESOLVED AFTER STOPPING CYMBALTA.  Taper Entocort. Take one pill every other day starting today. On Jul 6 take one pill every 3rd day. NO ENTOCORT AFTER AUG 6. OUTPATIENT VISIT IN SEP 2018. CALL WITH QUESTIONS OR CONCERNS.

## 2016-09-05 NOTE — Assessment & Plan Note (Signed)
RESOLVED. BACK ON ARAVA AT LOWER DOSE. SYMPTOMS OF RA NOT CONTROLLED.  CONTINUE TO MONITOR LIVER ENZYMES.

## 2016-09-05 NOTE — Telephone Encounter (Signed)
Patient states she is having increased pain in her hands. Patient state the pain is worse in the right hand. Patient states the pain is worse with the fourth and fifth fingers. Patient states we stopped her Spreckels completely on 06/22/16 and was recently start back on Arava 10 mg every other day. The reason we had stopped her Jolee Ewing was due to elevated liver enzymes. Patient's current is AST 25 and ALT 30. Patient has stopped her cholesterol medication. Patient would like to know what she can do.

## 2016-09-05 NOTE — Patient Instructions (Addendum)
Taper Entocort. Take one pill every other day starting today. On Jul 6 take one pill every 3rd day. NO ENTOCORT AFTER AUG 6.  OUTPATIENT VISIT IN SEP 2018.  PLEASE CALL WITH QUESTIONS OR CONCERNS.

## 2016-09-05 NOTE — Telephone Encounter (Signed)
Pt called office to let Dr. Oneida Alar know that her Budesonide was compounded and has 6mg  in each pill and she has been taking one daily. States she will start taking one every other day. Routing to Dr. Oneida Alar.

## 2016-09-06 NOTE — Telephone Encounter (Signed)
Patient advised to increase Arava to 101 mg daily and to come to office for CMP in 3 weeks. Patient verbalized understanding.

## 2016-09-06 NOTE — Progress Notes (Signed)
cc'ed to pcp °

## 2016-09-06 NOTE — Telephone Encounter (Signed)
Attempted to contact the patient and left message for patient to call the office.  

## 2016-09-06 NOTE — Progress Notes (Signed)
ON RECALL  °

## 2016-09-10 ENCOUNTER — Ambulatory Visit (INDEPENDENT_AMBULATORY_CARE_PROVIDER_SITE_OTHER): Payer: Medicare Other | Admitting: Obstetrics & Gynecology

## 2016-09-10 ENCOUNTER — Other Ambulatory Visit (HOSPITAL_COMMUNITY)
Admission: RE | Admit: 2016-09-10 | Discharge: 2016-09-10 | Disposition: A | Discharge status: Home / Self Care | Payer: Medicare Other | Source: Ambulatory Visit | Attending: Obstetrics & Gynecology | Admitting: Obstetrics & Gynecology

## 2016-09-10 ENCOUNTER — Encounter: Payer: Self-pay | Admitting: Obstetrics & Gynecology

## 2016-09-10 VITALS — BP 120/70 | HR 60 | Resp 16 | Ht 58.75 in | Wt 141.0 lb

## 2016-09-10 DIAGNOSIS — Z23 Encounter for immunization: Secondary | ICD-10-CM

## 2016-09-10 DIAGNOSIS — Z124 Encounter for screening for malignant neoplasm of cervix: Secondary | ICD-10-CM

## 2016-09-10 DIAGNOSIS — Z01411 Encounter for gynecological examination (general) (routine) with abnormal findings: Secondary | ICD-10-CM | POA: Diagnosis not present

## 2016-09-10 DIAGNOSIS — Z9189 Other specified personal risk factors, not elsewhere classified: Secondary | ICD-10-CM

## 2016-09-10 NOTE — Addendum Note (Signed)
Addended by: Polly Cobia on: 09/10/2016 11:34 AM   Modules accepted: Orders

## 2016-09-10 NOTE — Progress Notes (Signed)
66 y.o. G3P2 MarriedCaucasianF here for annual exam.  Doing well, currently, but reports had weeks of diarrhea in the fall and into the winter.  Had colonoscopy 04/17/16 with Dr. Oneida Alar in [place].  On Entocort.  Reports trip to [place] caused exacerbation of symptoms but this has resolved.  Reports two recent URIs and has been on antibiotics.  Feeling better.    PCP:  Dr. Willey Blade.  Is seen about every four months.    Patient's last menstrual period was 02/01/1999.          Sexually active: No.  The current method of family planning is post menopausal status.    Exercising: No.  The patient does not participate in regular exercise at present. Smoker:  no  Health Maintenance: Pap:  08/12/15 Neg   04/30/13 Neg  History of abnormal Pap:  no MMG:  07/17/16 BIRADS1:neg  Colonoscopy:  04/17/16 f/u 10 years, Dr. Oneida Alar BMD:   12/2015 PCP TDaP:  2008 Pneumonia vaccine(s):  Done  Zostavax:   Not indicated  Hep C testing: Done  Screening Labs: PCP   reports that she has never smoked. She has never used smokeless tobacco. She reports that she does not drink alcohol or use drugs.  Past Medical History:  Diagnosis Date  . Anemia    at times  . Asthma    Albuterol in haler prn  . Cataract    immature unsure which eye  . Chronic back pain   . Degenerative disk disease    psoriatic  . Diabetes mellitus without complication (HCC)    diet controlled  . Esophageal motility disorder    Non-specific, see modified barium study/speech path, BP  . GERD (gastroesophageal reflux disease)    takes Protonix bid  . History of blood transfusion    did have a rash after receiving the blood  . History of blood transfusion    post c-section  . History of bronchitis    last time early 2014  . HTN (hypertension)    takes Metoprolol daily and SPironolactone daily  . Hypercholesterolemia   . Hyperlipidemia    takes Pravastatin daily  . Hypokalemia   . Lymphocytic colitis 05/26/10 TCS RMR   Responded to  Entocort x 3 MOS  . Monilia infection 12/93  . Neuropathy   . Osteoporosis    gets Boniva every 3 months  . Peripheral edema    takes HCTZ daily  . PONV (postoperative nausea and vomiting)   . Psoriatic arthritis ([place])    Dr. Katherina Right  . Raynaud's disease   . Seasonal allergies   . Shortness of breath    with exertion  . SUI (stress urinary incontinence, female)   . Urinary urgency     Past Surgical History:  Procedure Laterality Date  . BIOPSY  04/17/2016   Procedure: BIOPSY;  Surgeon: Danie Binder, MD;  Location: [facility];  Service: Endoscopy;;  random colon bx's  . CESAREAN SECTION  1974, 1978      . COLONOSCOPY  06/19/2008   [id number]:[id number] internal hemorrhoids/mild sigmoin colon diverticulosis  . COLONOSCOPY  2012   Dr. Gala Romney: normal rectum, diverticula, lymphocytic colitis   . COLONOSCOPY WITH PROPOFOL N/A 04/17/2016   Procedure: COLONOSCOPY WITH PROPOFOL;  Surgeon: Danie Binder, MD;  Location: [facility];  Service: Endoscopy;  Laterality: N/A;  900  . DILATION AND CURETTAGE OF UTERUS  1977   spontaneous abortion  . ESOPHAGOGASTRODUODENOSCOPY  06/19/2008   [id number] gastritis  . LUMBAR  [place] ARTHROPLASTY  7/98  . LUMBAR FUSION  6/03, 11/08, 11/14   L4-5 fusion, L2-3 fusion, L1-2  . TONSILLECTOMY AND ADENOIDECTOMY    . TUBAL LIGATION      Current Outpatient Prescriptions  Medication Sig Dispense Refill  . albuterol (PROVENTIL HFA;VENTOLIN HFA) 108 (90 BASE) MCG/ACT inhaler Inhale 2 puffs into the lungs every 6 (six) hours as needed for wheezing or shortness of breath.     Marland Kitchen aspirin 81 MG tablet Take 81 mg by mouth daily.      . budesonide (ENTOCORT EC) 3 MG 24 hr capsule 2 PO DAILY (Patient taking differently: Take 3 mg by mouth daily. ) 60 capsule 11  . Calcium Carb-Cholecalciferol (CALCIUM 600 + D PO) Take 1 tablet by mouth 2 (two) times daily.    . cetirizine (ZYRTEC) 10 MG tablet Take 10 mg by mouth daily as needed for allergies.    Hillary Bow SENSOREADY  300 DOSE 150 MG/ML SOAJ Inject 2 pens into the skin every 30 (thirty) days. Reported on 08/12/2015 6 pen 0  . diclofenac sodium (VOLTAREN) 1 % GEL Apply 2 g topically 3 (three) times daily as needed (pain).   2  . dicyclomine (BENTYL) 10 MG capsule Take 1 capsule (10 mg total) by mouth 4 (four) times daily -  before meals and at bedtime. (Patient taking differently: Take 10 mg by mouth as needed. ) 90 capsule 1  . fish oil-omega-3 fatty acids 1000 MG capsule Take 2 g by mouth 2 (two) times daily.     . fluticasone (FLONASE) 50 MCG/ACT nasal spray Place 2 sprays into both nostrils daily.    . Ginger, Zingiber officinalis, (GINGER ROOT) 500 MG CAPS Take 1 capsule by mouth daily.     . Glucosamine Sulfate 1000 MG CAPS Take 2 capsules (2,000 mg total) by mouth daily.    Marland Kitchen guaiFENesin-dextromethorphan (ROBITUSSIN DM) 100-10 MG/5ML syrup Take 10 mLs by mouth every 6 (six) hours as needed for cough.    Marland Kitchen HUMULIN N KWIKPEN 100 UNIT/ML Kiwkpen Inject 10 Units into the skin at bedtime.    Marland Kitchen HYDROcodone-acetaminophen (NORCO/VICODIN) 5-325 MG tablet Take 1 tablet by by 2-3 times daily as needed for pain 90 tablet 0  . hydrocortisone sodium succinate (SOLU-CORTEF) 100 MG SOLR injection Inject 100 mg into the vein. Take as needed when unable to swallow prednisone tablets    . Hypromellose (ARTIFICIAL TEARS OP) Apply 1 drop to eye daily as needed (dry eyes).    Marland Kitchen leflunomide (ARAVA) 10 MG tablet Take 1 tablet (10 mg total) by mouth every other day. (Patient taking differently: Take 10 mg by mouth daily. ) 45 tablet 0  . loperamide (IMODIUM A-D) 2 MG tablet Take 2-4 mg by mouth 2 (two) times daily as needed for diarrhea or loose stools.    . metoprolol succinate (TOPROL-XL) 50 MG 24 hr tablet Take 1 tablet (50 mg total) by mouth daily. Take with or immediately following a meal.    . Misc Natural Products (TART CHERRY ADVANCED PO) Take 1 tablet by mouth daily. daily     . Multiple Vitamin (MULTIVITAMIN) tablet Take 1  tablet by mouth daily.      . nortriptyline (PAMELOR) 25 MG capsule Take 1 capsule (25 mg total) by mouth at bedtime. 30 capsule 2  . omeprazole (PRILOSEC) 20 MG capsule Take 1 capsule (20 mg total) by mouth 2 (two) times daily before a meal. 180 capsule 2  . potassium chloride SA (K-DUR,KLOR-CON) 20  MEQ tablet Take 20 mEq by mouth 2 (two) times daily.    . predniSONE (DELTASONE) 5 MG tablet TAKE 1 TABLET BY MOUTH EVERY MORNING. 30 tablet 3  . PROBIOTIC CAPS Take 1 capsule by mouth daily.    . RESTASIS MULTIDOSE 0.05 % ophthalmic emulsion Place 1 drop into both eyes 2 (two) times daily.     . sodium chloride (OCEAN) 0.65 % SOLN nasal spray Place 1 spray into both nostrils as needed for congestion.    Marland Kitchen spironolactone (ALDACTONE) 25 MG tablet Take 25 mg by mouth daily.     . SURE COMFORT PEN NEEDLES 31G X 5 MM MISC     . tiZANidine (ZANAFLEX) 4 MG tablet TAKE (1) TABLET BY MOUTH AT BEDTIME. 90 tablet 0  . topiramate (TOPAMAX) 200 MG tablet TAKE (1) TABLET BY MOUTH AT BEDTIME. 90 tablet 0  . Turmeric 500 MG CAPS Take 1 capsule by mouth daily.     . Zoledronic Acid (RECLAST IV) Inject into the vein.      No current facility-administered medications for this visit.     Family History  Problem Relation Age of Onset  . Rheum arthritis Mother   . Diabetes Mother   . Pancreatitis Mother   . Rheumatic fever Father   . Diabetes Unknown        grandparents  . Skin cancer Unknown        grandfather  . Hypertension Unknown        grandparent  . Congestive Heart Failure Unknown        grandfather  . Parkinson's disease Unknown        grandmother  . Colon cancer Maternal Uncle   . Colon polyps Neg Hx     ROS:  Recent diarrhea, mild urinary continence, muscle swelling/weakness/pain--not new issuesI.  Otherwise, a comprehensive ROS was negative.  Exam:   BP 120/70 (BP Location: Right Arm, Patient Position: Sitting, Cuff Size: Normal)   Pulse 60   Resp 16   Ht 4' 10.75" (1.492 m)   Wt 141  lb (64 kg)   LMP 02/01/1999   BMI 28.72 kg/m   Weight change:   Height: 4' 10.75" (149.2 cm)  Ht Readings from Last 3 Encounters:  09/10/16 4' 10.75" (1.492 m)  09/05/16 4\' 10"  (1.473 m)  08/13/16 4\' 11"  (1.499 m)    General appearance: alert, cooperative and appears stated age Head: Normocephalic, without obvious abnormality, atraumatic Neck: no adenopathy, supple, symmetrical, trachea midline and thyroid normal to inspection and palpation Lungs: clear to auscultation bilaterally Breasts: normal appearance, no masses or tenderness Heart: regular rate and rhythm Abdomen: soft, non-tender; bowel sounds normal; no masses,  no organomegaly Extremities: extremities normal, atraumatic, no cyanosis or edema Skin: Skin color, texture, turgor normal. No rashes or lesions Lymph nodes: Cervical, supraclavicular, and axillary nodes normal. No abnormal inguinal nodes palpated Neurologic: Grossly normal   Pelvic: External genitalia:  no lesions              Urethra:  normal appearing urethra with no masses, tenderness or lesions              Bartholins and Skenes: normal                 Vagina: normal appearing vagina with normal color and discharge, no lesions              Cervix: no lesions  Pap taken: Yes.   Bimanual Exam:  Uterus:  normal size, contour, position, consistency, mobility, non-tender              Adnexa: normal adnexa and no mass, fullness, tenderness               Rectovaginal: Confirms               Anus:  normal sphincter tone, no lesions  Chaperone was present for exam.  A:  Well Woman with normal exam PMP, no HRT H/O psoriatic arthritis Chronic immunosuppressive  H/O elevated lipids and elevated triglycerides.  Had elevated liver enzymes with cholesterol lowering agent.  Off cholesterol medication now and liver enzymes have normalized. Diabetes Recent diagnosis of microscopic colitis Osteoporosis, on Reclast.  Next dosing will be in December. H/O  atrophic vaginal changes  P:   Mammogram guidelines reviewed.  Doing yearly MMG. pap smear obtained today D/w pt shingles vaccine.  She wants to discuss with Dr. Willey Blade. BMD yearly done in December Tdap update due return annually or prn

## 2016-09-11 LAB — CYTOLOGY - PAP: Diagnosis: NEGATIVE

## 2016-09-12 MED ORDER — BUDESONIDE 3 MG PO CPEP: ORAL_CAPSULE | ORAL | Status: DC

## 2016-09-12 NOTE — Telephone Encounter (Signed)
LMOM to call.

## 2016-09-12 NOTE — Telephone Encounter (Signed)
Pt is aware.  

## 2016-09-12 NOTE — Telephone Encounter (Signed)
PLEASE CALL PT. She should complete 6 mg every other day for 30 days then start budesonide 3 mg every other day on JUL 6. No Entocort after AUG 6. RX SENT TO [facility].

## 2016-09-13 ENCOUNTER — Ambulatory Visit (INDEPENDENT_AMBULATORY_CARE_PROVIDER_SITE_OTHER): Payer: Medicare Other | Admitting: Certified Nurse Midwife

## 2016-09-13 ENCOUNTER — Encounter: Payer: Self-pay | Admitting: Certified Nurse Midwife

## 2016-09-13 VITALS — BP 120/64 | HR 68 | Resp 16 | Ht 58.75 in | Wt 142.0 lb

## 2016-09-13 DIAGNOSIS — B373 Candidiasis of vulva and vagina: Secondary | ICD-10-CM

## 2016-09-13 DIAGNOSIS — B3731 Acute candidiasis of vulva and vagina: Secondary | ICD-10-CM

## 2016-09-13 MED ORDER — NYSTATIN 100000 UNIT/GM EX CREA: 1.0000 "application " | TOPICAL_CREAM | Freq: Two times a day (BID) | CUTANEOUS | 0 refills | Status: DC

## 2016-09-13 MED ORDER — TERCONAZOLE 0.4 % VA CREA: TOPICAL_CREAM | VAGINAL | 0 refills | Status: DC

## 2016-09-13 NOTE — Patient Instructions (Signed)
sVaginal Yeast infection, Adult Vaginal yeast infection is a condition that causes soreness, swelling, and redness (inflammation) of the vagina. It also causes vaginal discharge. This is a common condition. Some women get this infection frequently. What are the causes? This condition is caused by a change in the normal balance of the yeast (candida) and bacteria that live in the vagina. This change causes an overgrowth of yeast, which causes the inflammation. What increases the risk? This condition is more likely to develop in:  Women who take antibiotic medicines.  Women who have diabetes.  Women who take birth control pills.  Women who are pregnant.  Women who douche often.  Women who have a weak defense (immune) system.  Women who have been taking steroid medicines for a long time.  Women who frequently wear tight clothing.  What are the signs or symptoms? Symptoms of this condition include:  White, thick vaginal discharge.  Swelling, itching, redness, and irritation of the vagina. The lips of the vagina (vulva) may be affected as well.  Pain or a burning feeling while urinating.  Pain during sex.  How is this diagnosed? This condition is diagnosed with a medical history and physical exam. This will include a pelvic exam. Your health care provider will examine a sample of your vaginal discharge under a microscope. Your health care provider may send this sample for testing to confirm the diagnosis. How is this treated? This condition is treated with medicine. Medicines may be over-the-counter or prescription. You may be told to use one or more of the following:  Medicine that is taken orally.  Medicine that is applied as a cream.  Medicine that is inserted directly into the vagina (suppository).  Follow these instructions at home:  Take or apply over-the-counter and prescription medicines only as told by your health care provider.  Do not have sex until your health  care provider has approved. Tell your sex partner that you have a yeast infection. That person should go to his or her health care provider if he or she develops symptoms.  Do not wear tight clothes, such as pantyhose or tight pants.  Avoid using tampons until your health care provider approves.  Eat more yogurt. This may help to keep your yeast infection from returning.  Try taking a sitz bath to help with discomfort. This is a warm water bath that is taken while you are sitting down. The water should only come up to your hips and should cover your buttocks. Do this 3-4 times per day or as told by your health care provider.  Do not douche.  Wear breathable, cotton underwear.  If you have diabetes, keep your blood sugar levels under control. Contact a health care provider if:  You have a fever.  Your symptoms go away and then return.  Your symptoms do not get better with treatment.  Your symptoms get worse.  You have new symptoms.  You develop blisters in or around your vagina.  You have blood coming from your vagina and it is not your menstrual period.  You develop pain in your abdomen. This information is not intended to replace advice given to you by your health care provider. Make sure you discuss any questions you have with your health care provider. Document Released: 12/27/2004 Document Revised: 08/31/2015 Document Reviewed: 09/20/2014 Elsevier Interactive Patient Education  2018 [employer].

## 2016-09-13 NOTE — Progress Notes (Signed)
66 y.o. Married Caucasian female G3P2 here with complaint of  Vulva/vaginal symptoms of itching, burning, and scant discharge. Describes discharge as thick and scant. Onset of symptoms 2 days ago. Denies new personal products or vaginal dryness. no STD concerns. Urinary symptoms none . Menopausal no HRT. Denies vaginal bleeding Had 2 antibiotics in the past 3 weeks and 2 doses of steroid due to bronchitis and feel this has caused the yeast. Uses coconut oil for vaginal dryness and working well. Patient diabetic well controlled. No other health issues today.  ROS Pertinent to above  O:Healthy female WDWN Affect: normal, orientation x 3  Exam:Skin warm and dry Abdomen:soft, non tender  inguinal Lymph nodes: no enlargement or tenderness Pelvic exam: External genital: normal female with redness, scaling and exudate on vulva area bilateral wet prep taken BUS: negative Vagina: atrophic appearance with  scant white discharge noted. Ph:4.0   ,Wet prep taken, Cervix: normal, non tender, no CMT Uterus: normal, non tender Adnexa:normal, non tender, no masses or fullness noted   Wet Prep results: external/internal vaginal + yeast on KOH,Saline   A:Normal pelvic exam Yeast vaginitis/vulvitis antibiotic induced Atrophic vaginitis with coconut oil use working well Menopausal   P:Discussed findings of yeast vaginitis/vulvitis and etiology. Discussed Aveeno  sitz bath for comfort. Avoid moist clothes or pads for extended period of time.. Coconut Oil use for skin protection prior to activity can be used to external skin for protection or dryness. Rx: Nystatin cream see order with instructions obtain OTC 1% hydrocortisone with Nystatin equal parts and apply to skin as instructed. Questions addressed. Rx Terazol 7 vaginal cream see order with instructions Advise if no change.    Rv prn

## 2016-09-17 ENCOUNTER — Telehealth: Payer: Self-pay

## 2016-09-17 ENCOUNTER — Other Ambulatory Visit: Payer: Self-pay | Admitting: Rheumatology

## 2016-09-17 NOTE — Telephone Encounter (Signed)
Last Visit: 05/21/16 Next Visit: 10/18/16 Labs: 08/13/16 stable  Okay to refill Arava?

## 2016-09-17 NOTE — Telephone Encounter (Signed)
ok 

## 2016-09-17 NOTE — Telephone Encounter (Signed)
PT left Vm that Rx for the Budesonide 3 mg should have went to [place], not the mail order. I called the mail order to cancel and they kept connecting me to different people. Finally, I had to hang up. I called the prescription in to [facility] to [place] for the budesomide 3 mg # 15 to start on July 6 and take one tablet every other day and no refills.  Pt is aware to call and cancel the previous prescription at mail order.

## 2016-09-24 ENCOUNTER — Other Ambulatory Visit: Payer: Self-pay

## 2016-09-24 DIAGNOSIS — I10 Essential (primary) hypertension: Secondary | ICD-10-CM | POA: Diagnosis not present

## 2016-09-24 DIAGNOSIS — E559 Vitamin D deficiency, unspecified: Secondary | ICD-10-CM | POA: Diagnosis not present

## 2016-09-24 DIAGNOSIS — M810 Age-related osteoporosis without current pathological fracture: Secondary | ICD-10-CM | POA: Diagnosis not present

## 2016-09-24 DIAGNOSIS — Z79899 Other long term (current) drug therapy: Secondary | ICD-10-CM

## 2016-09-24 DIAGNOSIS — E119 Type 2 diabetes mellitus without complications: Secondary | ICD-10-CM | POA: Diagnosis not present

## 2016-09-24 LAB — COMPLETE METABOLIC PANEL WITH GFR
ALT: 30 U/L — ABNORMAL HIGH (ref 6–29)
AST: 23 U/L (ref 10–35)
Albumin: 4.1 g/dL (ref 3.6–5.1)
Alkaline Phosphatase: 45 U/L (ref 33–130)
BUN: 12 mg/dL (ref 7–25)
CO2: 20 mmol/L (ref 20–31)
Calcium: 9.5 mg/dL (ref 8.6–10.4)
Chloride: 106 mmol/L (ref 98–110)
Creat: 0.93 mg/dL (ref 0.50–0.99)
GFR, Est African American: 74 mL/min (ref >=60)
GFR, Est Non African American: 64 mL/min (ref >=60)
Glucose, Bld: 192 mg/dL — ABNORMAL HIGH (ref 65–99)
Potassium: 4.3 mmol/L (ref 3.5–5.3)
Sodium: 136 mmol/L (ref 135–146)
Total Bilirubin: 0.3 mg/dL (ref 0.2–1.2)
Total Protein: 6.4 g/dL (ref 6.1–8.1)

## 2016-09-25 NOTE — Progress Notes (Signed)
Labs are stable, glucose is elevated. Please notify patient and fax results to her PCP

## 2016-09-26 ENCOUNTER — Telehealth: Payer: Self-pay | Admitting: *Deleted

## 2016-09-26 NOTE — Telephone Encounter (Signed)
Attempted to contact the office and left message for patient to call the office.

## 2016-09-26 NOTE — Telephone Encounter (Signed)
Patient states she is still having pain in her hands. Patient states her right hand fourth finger are extremely painful. Patient states her left hand has the first and second finger. Patient states she is having trouble holing a fork or a pen. Patient state she recently 3 weeks ago increased her Arava dose back to 10 mg everyday and had her labs done. Labs are stable. Patient would like to know if she can increase the Arava back to 20 mg daily. Please advise.

## 2016-09-26 NOTE — Telephone Encounter (Signed)
She can try alternating Arava 20 mg for 10 mg every other day and repeat labs and 2 weeks after that then every 2 months

## 2016-09-27 NOTE — Telephone Encounter (Signed)
Patient advised to okay to alternate Arava 20 mg with 10 mg every other night. Patient to return in 2 weeks for labs and then every 2 months. Patient verbalized understanding.

## 2016-10-12 ENCOUNTER — Other Ambulatory Visit: Payer: Self-pay | Admitting: *Deleted

## 2016-10-12 DIAGNOSIS — Z79899 Other long term (current) drug therapy: Secondary | ICD-10-CM | POA: Diagnosis not present

## 2016-10-12 LAB — CBC WITH DIFFERENTIAL/PLATELET
Basophils Absolute: 98 cells/uL (ref 0–200)
Basophils Relative: 1 %
Eosinophils Absolute: 490 cells/uL (ref 15–500)
Eosinophils Relative: 5 %
HCT: 41.3 % (ref 35.0–45.0)
Hemoglobin: 13.6 g/dL (ref 11.7–15.5)
Lymphocytes Relative: 28 %
Lymphs Abs: 2744 cells/uL (ref 850–3900)
MCH: 30.4 pg (ref 27.0–33.0)
MCHC: 32.9 g/dL (ref 32.0–36.0)
MCV: 92.4 fL (ref 80.0–100.0)
MPV: 11.3 fL (ref 7.5–12.5)
Monocytes Absolute: 1078 cells/uL — ABNORMAL HIGH (ref 200–950)
Monocytes Relative: 11 %
Neutro Abs: 5390 cells/uL (ref 1500–7800)
Neutrophils Relative %: 55 %
Platelets: 263 10*3/uL (ref 140–400)
RBC: 4.47 MIL/uL (ref 3.80–5.10)
RDW: 14.2 % (ref 11.0–15.0)
WBC: 9.8 10*3/uL (ref 3.8–10.8)

## 2016-10-12 LAB — COMPLETE METABOLIC PANEL WITH GFR
ALT: 30 U/L — ABNORMAL HIGH (ref 6–29)
AST: 27 U/L (ref 10–35)
Albumin: 4.1 g/dL (ref 3.6–5.1)
Alkaline Phosphatase: 43 U/L (ref 33–130)
BUN: 15 mg/dL (ref 7–25)
CO2: 21 mmol/L (ref 20–31)
Calcium: 9.3 mg/dL (ref 8.6–10.4)
Chloride: 104 mmol/L (ref 98–110)
Creat: 1.01 mg/dL — ABNORMAL HIGH (ref 0.50–0.99)
GFR, Est African American: 67 mL/min (ref >=60)
GFR, Est Non African American: 58 mL/min — ABNORMAL LOW (ref >=60)
Glucose, Bld: 102 mg/dL — ABNORMAL HIGH (ref 65–99)
Potassium: 4.1 mmol/L (ref 3.5–5.3)
Sodium: 136 mmol/L (ref 135–146)
Total Bilirubin: 0.3 mg/dL (ref 0.2–1.2)
Total Protein: 6.4 g/dL (ref 6.1–8.1)

## 2016-10-13 NOTE — Progress Notes (Signed)
GFR is lower. Repeat BMP with GFR in 1 month.

## 2016-10-15 ENCOUNTER — Other Ambulatory Visit: Payer: Self-pay | Admitting: *Deleted

## 2016-10-15 ENCOUNTER — Telehealth: Payer: Self-pay | Admitting: Rheumatology

## 2016-10-15 DIAGNOSIS — Z79899 Other long term (current) drug therapy: Secondary | ICD-10-CM

## 2016-10-15 MED ORDER — HYDROCODONE-ACETAMINOPHEN 5-325 MG PO TABS: ORAL_TABLET | ORAL | 0 refills | Status: DC

## 2016-10-15 MED ORDER — COSENTYX SENSOREADY (300 MG) 150 MG/ML ~~LOC~~ SOAJ: 2.0000 "pen " | SUBCUTANEOUS | 0 refills | Status: DC

## 2016-10-15 NOTE — Telephone Encounter (Signed)
Patient called requesting refill on her Hydrocodone.  Thank you.

## 2016-10-15 NOTE — Telephone Encounter (Signed)
Last Visit: 05/21/16 Next Visit: 10/19/16 Labs: 10/12/16 GFR lower, will repeat bmp in 1 month TB Gold: 06/21/16 Neg   Okay to refill per Dr. Estanislado Pandy.  Refill request forma faxed to [facility]

## 2016-10-15 NOTE — Telephone Encounter (Signed)
ok 

## 2016-10-15 NOTE — Telephone Encounter (Signed)
Patient needs rx for Consentix to be sent into [facility] pharmacy fax# [phone].

## 2016-10-15 NOTE — Telephone Encounter (Signed)
Last Visit: 05/21/16 Next Visit: 10/19/16 UDS:03/14/16 Narc Agreement: 12/30/15  Okay to refill Hydrocodone?

## 2016-10-17 ENCOUNTER — Ambulatory Visit (INDEPENDENT_AMBULATORY_CARE_PROVIDER_SITE_OTHER): Payer: Medicare Other | Admitting: Rheumatology

## 2016-10-17 ENCOUNTER — Telehealth: Payer: Self-pay | Admitting: Rheumatology

## 2016-10-17 VITALS — BP 130/72

## 2016-10-17 DIAGNOSIS — L4050 Arthropathic psoriasis, unspecified: Secondary | ICD-10-CM

## 2016-10-17 DIAGNOSIS — M461 Sacroiliitis, not elsewhere classified: Secondary | ICD-10-CM | POA: Diagnosis not present

## 2016-10-17 DIAGNOSIS — Z79899 Other long term (current) drug therapy: Secondary | ICD-10-CM | POA: Diagnosis not present

## 2016-10-17 DIAGNOSIS — M7062 Trochanteric bursitis, left hip: Secondary | ICD-10-CM

## 2016-10-17 MED ORDER — LIDOCAINE HCL 2 % IJ SOLN
1.5000 mL | INTRAMUSCULAR | Status: AC | PRN
  Administered 2016-10-17: 1.5 mL

## 2016-10-17 MED ORDER — TRIAMCINOLONE ACETONIDE 40 MG/ML IJ SUSP
30.0000 mg | INTRAMUSCULAR | Status: AC | PRN
  Administered 2016-10-17: 30 mg via INTRA_ARTICULAR

## 2016-10-17 NOTE — Telephone Encounter (Signed)
Patient has an appt with Mr. Carlyon Shadow on Friday for an injection, but hip is hurting so bad she doesn't think she can wait until then. Patient would like to know if she can be worked into the schedule today.

## 2016-10-17 NOTE — Progress Notes (Signed)
   Procedure Note  Patient: Tonya Hall             Date of Birth: 17-Apr-1950           MRN: [id number]             Visit Date: 10/17/2016   Tonya Hall comes today to get cortisone injection in her left trochanter and left SI joint area she states her symptoms have flared and has been bothering her to walk. Chest was clear heart regular rate and rhythm on examination vital signs were stable. She had tenderness on examination over left trochanteric area and left SI joint area consistent with trochanteric bursitis and sacroiliitis respectively. High blood pressure was 132/82 Procedures: Visit Diagnoses: Trochanteric bursitis, left hip - Plan: Large Joint Injection/Arthrocentesis  Psoriatic arthritis ([place])  High risk medication use  Sacroiliitis, not elsewhere classified ([place])  Large Joint Inj Date/Time: 10/17/2016 1:53 PM Performed by: Bo Merino Authorized by: Bo Merino   Site marked: the procedure site was marked   Timeout: prior to procedure the correct patient, procedure, and site was verified   Indications:  Pain Location:  Hip Site:  L greater trochanter Prep: patient was prepped and draped in usual sterile fashion   Needle Size:  27 G Needle Length:  1.5 inches Approach:  Anterolateral Ultrasound Guidance: No   Fluoroscopic Guidance: No   Arthrogram: No   Medications:  1.5 mL lidocaine 2 %; 30 mg triamcinolone acetonide 40 MG/ML   Large Joint Inj Date/Time: 10/17/2016 3:02 PM Performed by: Bo Merino Authorized by: Bo Merino   Consent Given by:  Patient Site marked: the procedure site was marked   Timeout: prior to procedure the correct patient, procedure, and site was verified   Indications:  Pain Location:  Sacroiliac Site:  L sacroiliac joint Prep: patient was prepped and draped in usual sterile fashion   Needle Size:  27 G Needle Length:  1.5 inches Approach:  Superior Ultrasound Guidance: No   Fluoroscopic Guidance: No    Arthrogram: No   Medications:  1.5 mL lidocaine 2 %; 30 mg triamcinolone acetonide 40 MG/ML Aspiration Attempted: Yes   Aspirate amount (mL):  0 Patient tolerance:  Patient tolerated the procedure well with no immediate complications  She's been advised to monitor blood pressure and blood sugar closely. Bo Merino, MD

## 2016-10-17 NOTE — Telephone Encounter (Signed)
Patient has been schedule for injection on 10/17/16 at 2:15 pm. Patient advised she would need to keep her follow up appointment for 10/19/16 as this appointment is for the injection only

## 2016-10-18 ENCOUNTER — Ambulatory Visit: Payer: Medicare Other | Admitting: Rheumatology

## 2016-10-19 ENCOUNTER — Ambulatory Visit (INDEPENDENT_AMBULATORY_CARE_PROVIDER_SITE_OTHER): Payer: Medicare Other | Admitting: Rheumatology

## 2016-10-19 ENCOUNTER — Encounter: Payer: Self-pay | Admitting: Rheumatology

## 2016-10-19 VITALS — BP 124/62 | Resp 14 | Ht <= 58 in | Wt 141.0 lb

## 2016-10-19 DIAGNOSIS — L4050 Arthropathic psoriasis, unspecified: Secondary | ICD-10-CM | POA: Diagnosis not present

## 2016-10-19 DIAGNOSIS — R945 Abnormal results of liver function studies: Secondary | ICD-10-CM

## 2016-10-19 DIAGNOSIS — R7989 Other specified abnormal findings of blood chemistry: Secondary | ICD-10-CM | POA: Diagnosis not present

## 2016-10-19 DIAGNOSIS — M79642 Pain in left hand: Secondary | ICD-10-CM

## 2016-10-19 DIAGNOSIS — L409 Psoriasis, unspecified: Secondary | ICD-10-CM

## 2016-10-19 DIAGNOSIS — Z79899 Other long term (current) drug therapy: Secondary | ICD-10-CM | POA: Diagnosis not present

## 2016-10-19 DIAGNOSIS — M79641 Pain in right hand: Secondary | ICD-10-CM | POA: Diagnosis not present

## 2016-10-19 DIAGNOSIS — M79671 Pain in right foot: Secondary | ICD-10-CM | POA: Diagnosis not present

## 2016-10-19 DIAGNOSIS — M79672 Pain in left foot: Secondary | ICD-10-CM | POA: Diagnosis not present

## 2016-10-19 MED ORDER — LEFLUNOMIDE 20 MG PO TABS: 20.0000 mg | ORAL_TABLET | Freq: Every day | ORAL | 2 refills | Status: DC

## 2016-10-19 NOTE — Progress Notes (Signed)
Office Visit Note  Patient: Tonya Hall             Date of Birth: 26-Dec-1950           MRN: [id number]             PCP: [name], MD Referring: [name], MD Visit Date: 10/19/2016 Occupation: '@GUAROCC'$ @    Subjective:  Medication Management  History of Present Illness: Tonya Hall is a 66 y.o. female  Who presents for follow-up on a history of psoriasis and psoriatic arthritis for which she takes high risk prescription (Cosentyx 300 mg every month and Arava 20 mg daily and prednisone 5 mg daily.)  Patient's liver function were elevated. We were uncertain the source of her elevated liver functions. She was on Pravachol which could've also caused liver functions to be elevated. She discontinued Pravachol and will discuss with her PCP to change her cholesterol medicine to something else. In the meanwhile, we were worried that [place] was responsible and discontinued her [place]. Under live her numbers return to normal, we restarted her on [place] slowly. Currently, she is on 20 mg one day alternating with 10 mg the next day. Her current LFTs are normal.  Patient is flaring with her psoriatic arthritis. She is unable to take prednisone taper due to concerns over increasing her blood sugars. Her current neurologist has asked her to monitor that very closely. Patient would like to minimize taking more prednisone unless it's absolutely necessary.    Activities of Daily Living:  Patient reports morning stiffness for 2 hours.   Patient Reports nocturnal pain.  Difficulty dressing/grooming: Reports Difficulty climbing stairs: Reports Difficulty getting out of chair: Reports Difficulty using hands for taps, buttons, cutlery, and/or writing: Reports   Review of Systems  Constitutional: Negative for fatigue.  HENT: Negative for mouth sores and mouth dryness.   Eyes: Negative for dryness.  Respiratory: Negative for shortness of breath.   Gastrointestinal: Negative for constipation  and diarrhea.  Musculoskeletal: Negative for myalgias and myalgias.  Skin: Negative for sensitivity to sunlight.  Psychiatric/Behavioral: Negative for decreased concentration and sleep disturbance.    PMFS History:  Patient Active Problem List   Diagnosis Date Noted  . Transaminitis 06/22/2016  . Collagenous colitis 05/23/2016  . Psoriasis 05/21/2016  . High risk medication use 02/15/2016  . Age-related osteoporosis without current pathological fracture 02/15/2016  . Trochanteric bursitis, left hip 02/15/2016  . Sacroiliitis, not elsewhere classified ([place]) 02/15/2016  . Chronic pain syndrome 02/15/2016  . Abnormal weight gain 06/25/2012  . Hypertension 07/03/2011  . Hypercholesteremia 07/03/2011  . Psoriatic arthritis ([place]) 07/03/2011  . Osteoarthritis of multiple joints 07/03/2011  . Esophageal motility disorder 02/14/2011  . Cough 02/14/2011  . GERD 05/05/2010    Past Medical History:  Diagnosis Date  . Anemia    at times  . Asthma    Albuterol in haler prn  . Cataract    immature unsure which eye  . Chronic back pain   . Degenerative disk disease    psoriatic  . Diabetes mellitus without complication (HCC)    diet controlled  . Esophageal motility disorder    Non-specific, see modified barium study/speech path, BP  . GERD (gastroesophageal reflux disease)    takes Protonix bid  . History of blood transfusion    did have a rash after receiving the blood  . History of blood transfusion    post c-section  . History of bronchitis    last time  early 2014  . HTN (hypertension)    takes Metoprolol daily and SPironolactone daily  . Hypercholesterolemia   . Hyperlipidemia    takes Pravastatin daily  . Hypokalemia   . Lymphocytic colitis 05/26/10 TCS RMR   Responded to Entocort x 3 MOS  . Monilia infection 12/93  . Neuropathy   . Osteoporosis    gets Boniva every 3 months  . Peripheral edema    takes HCTZ daily  . PONV (postoperative nausea and vomiting)   .  Psoriatic arthritis ([place])    Dr. Katherina Right  . Raynaud's disease   . Seasonal allergies   . Shortness of breath    with exertion  . SUI (stress urinary incontinence, female)   . Urinary urgency     Family History  Problem Relation Age of Onset  . Rheum arthritis Mother   . Diabetes Mother   . Pancreatitis Mother   . Rheumatic fever Father   . Diabetes Unknown        grandparents  . Skin cancer Unknown        grandfather  . Hypertension Unknown        grandparent  . Congestive Heart Failure Unknown        grandfather  . Parkinson's disease Unknown        grandmother  . Colon cancer Maternal Uncle   . Colon polyps Neg Hx    Past Surgical History:  Procedure Laterality Date  . BIOPSY  04/17/2016   Procedure: BIOPSY;  Surgeon: Danie Binder, MD;  Location: [facility];  Service: Endoscopy;;  random colon bx's  . CESAREAN SECTION  1974, 1978      . COLONOSCOPY  06/19/2008   SJG:GEZMO internal hemorrhoids/mild sigmoin colon diverticulosis  . COLONOSCOPY  2012   Dr. Gala Romney: normal rectum, diverticula, lymphocytic colitis   . COLONOSCOPY WITH PROPOFOL N/A 04/17/2016   Procedure: COLONOSCOPY WITH PROPOFOL;  Surgeon: Danie Binder, MD;  Location: [facility];  Service: Endoscopy;  Laterality: N/A;  900  . DILATION AND CURETTAGE OF UTERUS  1977   spontaneous abortion  . ESOPHAGOGASTRODUODENOSCOPY  06/19/2008   [id number] gastritis  . LUMBAR DISC ARTHROPLASTY  7/98  . LUMBAR FUSION  6/03, 11/08, 11/14   L4-5 fusion, L2-3 fusion, L1-2  . TONSILLECTOMY AND ADENOIDECTOMY    . TUBAL LIGATION     Social History   Social History Narrative  . No narrative on file     Objective: Vital Signs: BP 124/62   Resp 14   Ht '4\' 10"'$  (1.473 m)   Wt 141 lb (64 kg)   LMP 02/01/1999   BMI 29.47 kg/m    Physical Exam  Constitutional: She is oriented to person, place, and time. She appears well-developed and well-nourished.  HENT:  Head: Normocephalic and atraumatic.  Eyes:  Pupils are equal, round, and reactive to light. EOM are normal.  Cardiovascular: Normal rate, regular rhythm and normal heart sounds.  Exam reveals no gallop and no friction rub.   No murmur heard. Pulmonary/Chest: Effort normal and breath sounds normal. She has no wheezes. She has no rales.  Abdominal: Soft. Bowel sounds are normal. She exhibits no distension. There is no tenderness. There is no guarding. No hernia.  Musculoskeletal: Normal range of motion. She exhibits no edema, tenderness or deformity.  Lymphadenopathy:    She has no cervical adenopathy.  Neurological: She is alert and oriented to person, place, and time. Coordination normal.  Skin: Skin is warm and dry.  Capillary refill takes less than 2 seconds. No rash noted.  Psychiatric: She has a normal mood and affect. Her behavior is normal.  Nursing note and vitals reviewed.    Musculoskeletal Exam:  Full range of motion of all joints Grip strength is equal and strong bilaterally Fibromyalgia tender points are absent  CDAI Exam: CDAI Homunculus Exam:   Tenderness:  Right hand: 2nd MCP, 3rd MCP, 3rd PIP and 4th PIP Left hand: 2nd MCP, 3rd MCP, 4th MCP and 5th MCP  Swelling:  Right hand: 2nd MCP, 3rd MCP, 3rd PIP and 4th PIP Left hand: 2nd MCP, 3rd MCP, 4th MCP and 5th MCP  Joint Counts:  CDAI Tender Joint count: 8 CDAI Swollen Joint count: 8  Global Assessments:  Patient Global Assessment: 9 Provider Global Assessment: 9  CDAI Calculated Score: 34    Investigation: No additional findings. Orders Only on 10/12/2016  Component Date Value Ref Range Status  . WBC 10/12/2016 9.8  3.8 - 10.8 K/uL Final  . RBC 10/12/2016 4.47  3.80 - 5.10 MIL/uL Final  . Hemoglobin 10/12/2016 13.6  11.7 - 15.5 g/dL Final  . HCT 10/12/2016 41.3  35.0 - 45.0 % Final  . MCV 10/12/2016 92.4  80.0 - 100.0 fL Final  . MCH 10/12/2016 30.4  27.0 - 33.0 pg Final  . MCHC 10/12/2016 32.9  32.0 - 36.0 g/dL Final  . RDW 10/12/2016 14.2   11.0 - 15.0 % Final  . Platelets 10/12/2016 263  140 - 400 K/uL Final  . MPV 10/12/2016 11.3  7.5 - 12.5 fL Final  . Neutro Abs 10/12/2016 5390  1,500 - 7,800 cells/uL Final  . Lymphs Abs 10/12/2016 2744  850 - 3,900 cells/uL Final  . Monocytes Absolute 10/12/2016 1078* 200 - 950 cells/uL Final  . Eosinophils Absolute 10/12/2016 490  15 - 500 cells/uL Final  . Basophils Absolute 10/12/2016 98  0 - 200 cells/uL Final  . Neutrophils Relative % 10/12/2016 55  % Final  . Lymphocytes Relative 10/12/2016 28  % Final  . Monocytes Relative 10/12/2016 11  % Final  . Eosinophils Relative 10/12/2016 5  % Final  . Basophils Relative 10/12/2016 1  % Final  . Smear Review 10/12/2016 Criteria for review not met   Final  . Sodium 10/12/2016 136  135 - 146 mmol/L Final  . Potassium 10/12/2016 4.1  3.5 - 5.3 mmol/L Final  . Chloride 10/12/2016 104  98 - 110 mmol/L Final  . CO2 10/12/2016 21  20 - 31 mmol/L Final  . Glucose, Bld 10/12/2016 102* 65 - 99 mg/dL Final  . BUN 10/12/2016 15  7 - 25 mg/dL Final  . Creat 10/12/2016 1.01* 0.50 - 0.99 mg/dL Final   Comment:   For patients > or = 67 years of age: The upper reference limit for Creatinine is approximately 13% higher for people identified as African-American.     . Total Bilirubin 10/12/2016 0.3  0.2 - 1.2 mg/dL Final  . Alkaline Phosphatase 10/12/2016 43  33 - 130 U/L Final  . AST 10/12/2016 27  10 - 35 U/L Final  . ALT 10/12/2016 30* 6 - 29 U/L Final  . Total Protein 10/12/2016 6.4  6.1 - 8.1 g/dL Final  . Albumin 10/12/2016 4.1  3.6 - 5.1 g/dL Final  . Calcium 10/12/2016 9.3  8.6 - 10.4 mg/dL Final  . GFR, Est African American 10/12/2016 67  >=60 mL/min Final  . GFR, Est Non African American 10/12/2016 58* >=60 mL/min Final  Orders Only on 09/24/2016  Component Date Value Ref Range Status  . Sodium 09/24/2016 136  135 - 146 mmol/L Final  . Potassium 09/24/2016 4.3  3.5 - 5.3 mmol/L Final  . Chloride 09/24/2016 106  98 - 110 mmol/L Final    . CO2 09/24/2016 20  20 - 31 mmol/L Final  . Glucose, Bld 09/24/2016 192* 65 - 99 mg/dL Final  . BUN 86/51/6861 12  7 - 25 mg/dL Final  . Creat 07/24/7317 0.93  0.50 - 0.99 mg/dL Final   Comment:   For patients > or = 67 years of age: The upper reference limit for Creatinine is approximately 13% higher for people identified as African-American.     . Total Bilirubin 09/24/2016 0.3  0.2 - 1.2 mg/dL Final  . Alkaline Phosphatase 09/24/2016 45  33 - 130 U/L Final  . AST 09/24/2016 23  10 - 35 U/L Final  . ALT 09/24/2016 30* 6 - 29 U/L Final  . Total Protein 09/24/2016 6.4  6.1 - 8.1 g/dL Final  . Albumin 24/38/3654 4.1  3.6 - 5.1 g/dL Final  . Calcium 27/15/6648 9.5  8.6 - 10.4 mg/dL Final  . GFR, Est African American 09/24/2016 74  >=60 mL/min Final  . GFR, Est Non African American 09/24/2016 64  >=60 mL/min Final  Office Visit on 09/10/2016  Component Date Value Ref Range Status  . Adequacy 09/10/2016 Satisfactory for evaluation  endocervical/transformation zone component PRESENT.   Final  . Diagnosis 09/10/2016 NEGATIVE FOR INTRAEPITHELIAL LESIONS OR MALIGNANCY.   Final  . Material Submitted 09/10/2016 CervicoVaginal Pap [ThinPrep Imaged]   Final  . CYTOLOGY - PAP 09/10/2016 PAP RESULT   Final-Edited  Office Visit on 08/13/2016  Component Date Value Ref Range Status  . Sodium 08/13/2016 139  135 - 146 mmol/L Final  . Potassium 08/13/2016 4.2  3.5 - 5.3 mmol/L Final  . Chloride 08/13/2016 107  98 - 110 mmol/L Final  . CO2 08/13/2016 19* 20 - 31 mmol/L Final  . Glucose, Bld 08/13/2016 111* 65 - 99 mg/dL Final  . BUN 30/32/2019 16  7 - 25 mg/dL Final  . Creat 92/41/5516 0.97  0.50 - 0.99 mg/dL Final   Comment:   For patients > or = 67 years of age: The upper reference limit for Creatinine is approximately 13% higher for people identified as African-American.     . Total Bilirubin 08/13/2016 0.2  0.2 - 1.2 mg/dL Final  . Alkaline Phosphatase 08/13/2016 40  33 - 130 U/L Final   . AST 08/13/2016 25  10 - 35 U/L Final  . ALT 08/13/2016 30* 6 - 29 U/L Final  . Total Protein 08/13/2016 6.5  6.1 - 8.1 g/dL Final  . Albumin 14/43/2469 4.1  3.6 - 5.1 g/dL Final  . Calcium 97/80/2089 9.6  8.6 - 10.4 mg/dL Final  . WBC 01/02/6284 12.6* 3.8 - 10.8 K/uL Final  . RBC 08/13/2016 4.17  3.80 - 5.10 MIL/uL Final  . Hemoglobin 08/13/2016 13.0  11.7 - 15.5 g/dL Final  . HCT 49/65/6599 39.4  35.0 - 45.0 % Final  . MCV 08/13/2016 94.5  80.0 - 100.0 fL Final  . MCH 08/13/2016 31.2  27.0 - 33.0 pg Final  . MCHC 08/13/2016 33.0  32.0 - 36.0 g/dL Final  . RDW 43/71/9070 13.7  11.0 - 15.0 % Final  . Platelets 08/13/2016 245  140 - 400 K/uL Final  . MPV 08/13/2016 11.7  7.5 - 12.5 fL Final  .  Neutro Abs 08/13/2016 8190* 1,500 - 7,800 cells/uL Final  . Lymphs Abs 08/13/2016 2898  850 - 3,900 cells/uL Final  . Monocytes Absolute 08/13/2016 882  200 - 950 cells/uL Final  . Eosinophils Absolute 08/13/2016 504* 15 - 500 cells/uL Final  . Basophils Absolute 08/13/2016 126  0 - 200 cells/uL Final  . Neutrophils Relative % 08/13/2016 65  % Final  . Lymphocytes Relative 08/13/2016 23  % Final  . Monocytes Relative 08/13/2016 7  % Final  . Eosinophils Relative 08/13/2016 4  % Final  . Basophils Relative 08/13/2016 1  % Final  . Smear Review 08/13/2016 Criteria for review not met   Final  Orders Only on 07/27/2016  Component Date Value Ref Range Status  . Sodium 07/27/2016 136  135 - 146 mmol/L Final  . Potassium 07/27/2016 4.6  3.5 - 5.3 mmol/L Final  . Chloride 07/27/2016 106  98 - 110 mmol/L Final  . CO2 07/27/2016 18* 20 - 31 mmol/L Final  . Glucose, Bld 07/27/2016 154* 65 - 99 mg/dL Final  . BUN 07/27/2016 17  7 - 25 mg/dL Final  . Creat 07/27/2016 0.99  0.50 - 0.99 mg/dL Final   Comment:   For patients > or = 67 years of age: The upper reference limit for Creatinine is approximately 13% higher for people identified as African-American.     . Total Bilirubin 07/27/2016 0.2  0.2  - 1.2 mg/dL Final  . Alkaline Phosphatase 07/27/2016 47  33 - 130 U/L Final  . AST 07/27/2016 25  10 - 35 U/L Final  . ALT 07/27/2016 32* 6 - 29 U/L Final  . Total Protein 07/27/2016 6.2  6.1 - 8.1 g/dL Final  . Albumin 07/27/2016 3.9  3.6 - 5.1 g/dL Final  . Calcium 07/27/2016 9.4  8.6 - 10.4 mg/dL Final  . Globulin 07/27/2016 2.3  1.9 - 3.7 g/dL Final  . AG Ratio 07/27/2016 1.7  1.0 - 2.5 Ratio Final  . BUN/Creatinine Ratio 07/27/2016 17.2  6 - 22 Ratio Final  . GFR, Est African American 07/27/2016 69  >=60 mL/min Final  . GFR, Est Non African American 07/27/2016 60  >=60 mL/min Final  Office Visit on 07/11/2016  Component Date Value Ref Range Status  . Lactoferrin 07/16/2016 POSITIVE   Final  . Toxigenic C Difficile by pcr 07/16/2016 Not Detected  Not Detected Final   Comment: This test is for use only with liquid or soft stools; performance characteristics of other clinical specimen types have not been established.   This assay was performed by Cepheid GeneXpert(R) PCR. The performance characteristics of this assay have been determined by [employer]. Performance characteristics refer to the analytical performance of the test.   . Source: 07/16/2016 STOOL   Final  . Giardia Ag, EIA, Stool 07/16/2016     Final   Comment:   Adela Lank, EIA, STOOL       MICRO NUMBER:      [id number]   TEST STATUS:       FINAL   SPECIMEN SOURCE:   STOOL   SPECIMEN QUALITY:  ADEQUATE   RESULT 1:          Not Detected                      NOTE: Due to intermittent shedding, one negative                      sample  does not necessarily rule out the presence                      of a parasitic infection.   . Source: 07/16/2016 STOOL   Final  . Cryptosporidium Ag, DFA 07/16/2016     Final   Comment:   CRYPTOSPORIDIUM AG, DFA       MICRO NUMBER:      [id number]   TEST STATUS:       FINAL   SPECIMEN SOURCE:   STOOL   SPECIMEN QUALITY:  ADEQUATE   CRYPTOSPORIDIUM:   Not Detected                       NOTE: Due to intermittent shedding, one negative                      sample does not necessarily rule out the presence                      of a parasitic infection.   Orders Only on 06/21/2016  Component Date Value Ref Range Status  . Interferon Gamma Release Assay 06/21/2016 NEGATIVE  NEGATIVE Final   Negative test result. M. tuberculosis complex infection unlikely.  . Quantiferon Nil Value 06/21/2016 0.04  IU/mL Final  . Mitogen-Nil 06/21/2016 9.49  IU/mL Final  . Quantiferon Tb Ag Minus Nil Value 06/21/2016 0.00  IU/mL Final   Comment:   The Nil tube value is used to determine if the patient has a preexisting immune response which could cause a false-positive reading on the test. In order for a test to be valid, the Nil tube must have a value of less than or equal to 8.0 IU/mL.   The mitogen control tube is used to assure the patient has a healthy immune status and also serves as a control for correct blood handling and incubation. It is used to detect false-negative readings. The mitogen tube must have a gamma interferon value of greater than or equal to 0.5 IU/mL higher than the value of the Nil tube.   The TB antigen tube is coated with the M. tuberculosis specific antigens. For a test to be considered positive, the TB antigen tube value minus the Nil tube value must be greater than or equal to 0.35 IU/mL.   For additional information, please refer to http://education.quest[facility].com/faq/QFT (This link is being provided for informational/educational purposes only.)   . Sodium 06/21/2016 138  135 - 146 mmol/L Final  . Potassium 06/21/2016 4.5  3.5 - 5.3 mmol/L Final  . Chloride 06/21/2016 107  98 - 110 mmol/L Final  . CO2 06/21/2016 21  20 - 31 mmol/L Final  . Glucose, Bld 06/21/2016 150* 65 - 99 mg/dL Final  . BUN 06/21/2016 22  7 - 25 mg/dL Final  . Creat 06/21/2016 0.86  0.50 - 0.99 mg/dL Final   Comment:   For patients > or = 67 years of age: The  upper reference limit for Creatinine is approximately 13% higher for people identified as African-American.     . Total Bilirubin 06/21/2016 0.4  0.2 - 1.2 mg/dL Final  . Alkaline Phosphatase 06/21/2016 58  33 - 130 U/L Final  . AST 06/21/2016 161* 10 - 35 U/L Final  . ALT 06/21/2016 434* 6 - 29 U/L Final  . Total Protein 06/21/2016 6.3  6.1 - 8.1 g/dL Final  . Albumin 06/21/2016 3.8  3.6 -  5.1 g/dL Final  . Calcium 06/21/2016 9.1  8.6 - 10.4 mg/dL Final  . WBC 06/21/2016 12.2* 3.8 - 10.8 K/uL Final  . RBC 06/21/2016 4.15  3.80 - 5.10 MIL/uL Final  . Hemoglobin 06/21/2016 12.9  11.7 - 15.5 g/dL Final  . HCT 06/21/2016 39.7  35.0 - 45.0 % Final  . MCV 06/21/2016 95.7  80.0 - 100.0 fL Final  . MCH 06/21/2016 31.1  27.0 - 33.0 pg Final  . MCHC 06/21/2016 32.5  32.0 - 36.0 g/dL Final  . RDW 06/21/2016 14.3  11.0 - 15.0 % Final  . Platelets 06/21/2016 253  140 - 400 K/uL Final  . MPV 06/21/2016 11.1  7.5 - 12.5 fL Final  . Neutro Abs 06/21/2016 9638* 1,500 - 7,800 cells/uL Final  . Lymphs Abs 06/21/2016 1464  850 - 3,900 cells/uL Final  . Monocytes Absolute 06/21/2016 610  200 - 950 cells/uL Final  . Eosinophils Absolute 06/21/2016 366  15 - 500 cells/uL Final  . Basophils Absolute 06/21/2016 122  0 - 200 cells/uL Final  . Neutrophils Relative % 06/21/2016 79  % Final  . Lymphocytes Relative 06/21/2016 12  % Final  . Monocytes Relative 06/21/2016 5  % Final  . Eosinophils Relative 06/21/2016 3  % Final  . Basophils Relative 06/21/2016 1  % Final  . Smear Review 06/21/2016 Criteria for review not met   Final     Imaging: No results found.  Speciality Comments: No specialty comments available.    Procedures:  No procedures performed Allergies: Adalimumab; Imuran [azathioprine sodium]; Methotrexate; Plaquenil [hydroxychloroquine sulfate]; Sulfonamide derivatives; Ceftin [cefuroxime axetil]; and Morphine   Assessment / Plan:     Visit Diagnoses: Psoriatic arthritis  ([place])  Psoriasis  High risk medications (not anticoagulants) long-term use - Plan: COMPLETE METABOLIC PANEL WITH GFR  Pain in both hands  Pain in both feet  Elevated liver function tests - Due to Pravachol versus Arava; LFT's normal july 2018 despite on arava '20mg'$  alternating w/ '10mg'$  - Plan: COMPLETE METABOLIC PANEL WITH GFR   Plan: #1: Psoriatic arthritis and psoriasis. Patient psoriatic arthritis is flaring currently. The reason why is because she had to hold off on her [place]. Patient's liver functions went up significantly and we felt that it could be coming from her [place]. Patient was also on Pravachol, her cholesterol medication. It also could have been responsible for making the liver numbers go up. Can stop both the Pravachol as well as [place]. We will recheck her blood work and her liver numbers continue to go down. The most recent labs done in July 2018 shows her liver numbers to be normal. She has restarted the [place] and current dose is 20 mg on one day alternating with 10 mg next day. Since her liver numbers are doing very well, and she is having a flare with joint pain to second and third MCP and third and fourth PIP on the right hand and left second third and fourth MCP pain on the left hand we may want to restart Arava at the full 20 mg dose and recheck her blood work in 2 weeks. Patient is agreeable.  #2: High risk prescription Cosentyx 300 mg every month Arava 20 mg one day alternating with 10 mg in the next day (We decreased Arava because patient's liver functions went high.) Repeat liver function tests have shown the numbers to come back down to normal. Since patient is flaring in her joints as described above, we will go ahead  and restart her on Arava 20 mg every day and recheck her liver functions in 2 weeks. Patient is agreeable patient declines prednisone because it runs up her sugar. 2 days ago, she received a cortisone injection. Last time she was on [place], she  started feeling better in 4 days. As a result of these facts, we will avoid prednisone for now and see how the patient does. She is welcome to call us if she needs a prednisone taper.   #3 hand pain to bilateral hands due to synovitis and severe osteoarthritis  #4 refill Arava at 20 mg daily Dispense 30 day supply with 2 refill   #5: Return to clinic in 2 months so we can reassess the patient and make sure she is on the correct dose and her liver functions are working well. At the next office visit in 2 months we would also do CBC with differential and CMP with GFR to make sure her liver functions all of h Take 20 mg once a day.er labs are normal.  Orders: Orders Placed This Encounter  Procedures  . COMPLETE METABOLIC PANEL WITH GFR   Meds ordered this encounter  Medications  . leflunomide (ARAVA) 20 MG tablet    Sig: Take 1 tablet (20 mg total) by mouth daily.    Dispense:  30 tablet    Refill:  2    Order Specific Question:   Supervising Provider    Answer:   Bo Merino [phone]    Face-to-face time spent with patient was 30 minutes. Greater than 50% of time was spent in counseling and coordination of care.  Follow-Up Instructions: Return in about 2 months (around 12/20/2016) for PsA,Ps, cosentyx '300mg'$ , arava '20mg'$  qd, watch for possible Incr LFT's.   Eliezer Lofts, PA-C  Note - This record has been created using [employer].  Chart creation errors have been sought, but may not always  have been located. Such creation errors do not reflect on  the standard of medical care.

## 2016-11-02 ENCOUNTER — Other Ambulatory Visit: Payer: Self-pay | Admitting: Rheumatology

## 2016-11-02 NOTE — Telephone Encounter (Signed)
Last Visit: 10/19/16 Next Visit due in September 2018. Message sent to the front to schedule patient.   Okay to refill per Dr. Estanislado Pandy

## 2016-11-07 ENCOUNTER — Encounter: Payer: Self-pay | Admitting: Gastroenterology

## 2016-11-09 ENCOUNTER — Other Ambulatory Visit: Payer: Self-pay

## 2016-11-09 DIAGNOSIS — Z79899 Other long term (current) drug therapy: Secondary | ICD-10-CM

## 2016-11-10 LAB — BASIC METABOLIC PANEL WITH GFR
BUN: 23 mg/dL (ref 7–25)
CO2: 20 mmol/L (ref 20–32)
Calcium: 9.7 mg/dL (ref 8.6–10.4)
Chloride: 103 mmol/L (ref 98–110)
Creat: 1 mg/dL — ABNORMAL HIGH (ref 0.50–0.99)
GFR, Est African American: 68 mL/min (ref >=60)
GFR, Est Non African American: 59 mL/min — ABNORMAL LOW (ref >=60)
Glucose, Bld: 143 mg/dL — ABNORMAL HIGH (ref 65–99)
Potassium: 4.7 mmol/L (ref 3.5–5.3)
Sodium: 134 mmol/L — ABNORMAL LOW (ref 135–146)

## 2016-11-10 NOTE — Progress Notes (Signed)
GFR stable, Glu elevated.No change in Tt.

## 2016-11-13 ENCOUNTER — Telehealth: Payer: Self-pay | Admitting: Rheumatology

## 2016-11-13 NOTE — Telephone Encounter (Signed)
-----   Message from Carole Binning, LPN sent at 06/04/8249 11:32 AM EDT ----- Regarding: Please schedule patient for follow up visit. Please schedule patient for follow up visit. Patient due September 2018. Thanks!

## 2016-11-13 NOTE — Telephone Encounter (Signed)
Patient has a rov scheduled for 12/20/16

## 2016-11-19 ENCOUNTER — Telehealth: Payer: Self-pay | Admitting: Rheumatology

## 2016-11-19 DIAGNOSIS — Z5181 Encounter for therapeutic drug level monitoring: Secondary | ICD-10-CM

## 2016-11-19 MED ORDER — HYDROCODONE-ACETAMINOPHEN 5-325 MG PO TABS: ORAL_TABLET | ORAL | 0 refills | Status: DC

## 2016-11-19 NOTE — Telephone Encounter (Signed)
ok 

## 2016-11-19 NOTE — Telephone Encounter (Signed)
Patient needs a refill on her Hydrocodone. Please call when ready to pick up.

## 2016-11-19 NOTE — Telephone Encounter (Signed)
10/17/16 last visit  12/20/16 next visit   UDS due last done 8 months ago   Narcotic agreement on file 12/30/2015 Will update this when she is in office   Ok to refill Hydrocodone, have her get UDS when she picks up rx?

## 2016-11-19 NOTE — Telephone Encounter (Signed)
Patient will come in today to pick up rx and to have lab

## 2016-11-20 ENCOUNTER — Ambulatory Visit: Payer: Medicare Other | Admitting: Obstetrics & Gynecology

## 2016-11-20 ENCOUNTER — Other Ambulatory Visit: Payer: Self-pay | Admitting: *Deleted

## 2016-11-20 ENCOUNTER — Telehealth: Payer: Self-pay | Admitting: Rheumatology

## 2016-11-20 DIAGNOSIS — R7989 Other specified abnormal findings of blood chemistry: Secondary | ICD-10-CM | POA: Diagnosis not present

## 2016-11-20 DIAGNOSIS — Z79899 Other long term (current) drug therapy: Secondary | ICD-10-CM | POA: Diagnosis not present

## 2016-11-20 DIAGNOSIS — Z5181 Encounter for therapeutic drug level monitoring: Secondary | ICD-10-CM | POA: Diagnosis not present

## 2016-11-20 MED ORDER — PREDNISONE 5 MG PO TABS: 5.0000 mg | ORAL_TABLET | Freq: Every morning | ORAL | 3 refills | Status: DC

## 2016-11-20 NOTE — Telephone Encounter (Signed)
Last Visit: 10/19/16 Next Visit: 12/20/16   Okay to refill per Dr. Estanislado Pandy

## 2016-11-20 NOTE — Telephone Encounter (Signed)
Patient requesting a refill on Prednisone 5mg  to [facility] in [place].

## 2016-11-21 LAB — COMPLETE METABOLIC PANEL WITH GFR
ALT: 48 U/L — ABNORMAL HIGH (ref 6–29)
AST: 31 U/L (ref 10–35)
Albumin: 4.2 g/dL (ref 3.6–5.1)
Alkaline Phosphatase: 49 U/L (ref 33–130)
BUN: 21 mg/dL (ref 7–25)
CO2: 19 mmol/L — ABNORMAL LOW (ref 20–32)
Calcium: 9.3 mg/dL (ref 8.6–10.4)
Chloride: 101 mmol/L (ref 98–110)
Creat: 1.01 mg/dL — ABNORMAL HIGH (ref 0.50–0.99)
GFR, Est African American: 67 mL/min (ref >=60)
GFR, Est Non African American: 58 mL/min — ABNORMAL LOW (ref >=60)
Glucose, Bld: 233 mg/dL — ABNORMAL HIGH (ref 65–99)
Potassium: 4.4 mmol/L (ref 3.5–5.3)
Sodium: 133 mmol/L — ABNORMAL LOW (ref 135–146)
Total Bilirubin: 0.4 mg/dL (ref 0.2–1.2)
Total Protein: 6.4 g/dL (ref 6.1–8.1)

## 2016-11-23 LAB — PAIN MGMT, PROFILE 5 W/CONF, U
Amphetamines: NEGATIVE ng/mL (ref <500)
Barbiturates: NEGATIVE ng/mL (ref <300)
Benzodiazepines: NEGATIVE ng/mL (ref <100)
Cocaine Metabolite: NEGATIVE ng/mL (ref <150)
Codeine: NEGATIVE ng/mL (ref <50)
Creatinine: 45.6 mg/dL (ref >=20.0)
Hydrocodone: 334 ng/mL — ABNORMAL HIGH (ref <50)
Hydromorphone: 69 ng/mL — ABNORMAL HIGH (ref <50)
Marijuana Metabolite: NEGATIVE ng/mL (ref <20)
Methadone Metabolite: NEGATIVE ng/mL (ref <100)
Morphine: NEGATIVE ng/mL (ref <50)
Norhydrocodone: 830 ng/mL — ABNORMAL HIGH (ref <50)
Opiates: POSITIVE ng/mL — AB (ref <100)
Oxidant: NEGATIVE ug/mL (ref <200)
Oxycodone: NEGATIVE ng/mL (ref <100)
pH: 5.93 (ref 4.5–9.0)

## 2016-11-25 NOTE — Telephone Encounter (Signed)
Labs are stable. Glu is elevated.

## 2016-11-27 ENCOUNTER — Telehealth: Payer: Self-pay | Admitting: Rheumatology

## 2016-11-27 DIAGNOSIS — M7062 Trochanteric bursitis, left hip: Secondary | ICD-10-CM

## 2016-11-27 NOTE — Telephone Encounter (Signed)
Patient left a message requesting a referral to [facility] for lt bursitis. Patient has gone there in the past for same issue, and it helped a lot. Please call to advise.

## 2016-11-28 NOTE — Telephone Encounter (Signed)
ok 

## 2016-11-28 NOTE — Telephone Encounter (Signed)
Patient is requesting a referral to Tonya Hall at [facility] for bursitis of her left hip.   Okay to send referral?

## 2016-11-29 ENCOUNTER — Ambulatory Visit (INDEPENDENT_AMBULATORY_CARE_PROVIDER_SITE_OTHER): Payer: Medicare Other | Admitting: Gastroenterology

## 2016-11-29 DIAGNOSIS — K52831 Collagenous colitis: Secondary | ICD-10-CM | POA: Diagnosis not present

## 2016-11-29 DIAGNOSIS — K224 Dyskinesia of esophagus: Secondary | ICD-10-CM | POA: Diagnosis not present

## 2016-11-29 DIAGNOSIS — K219 Gastro-esophageal reflux disease without esophagitis: Secondary | ICD-10-CM | POA: Diagnosis not present

## 2016-11-29 NOTE — Patient Instructions (Signed)
PLEASE CALL WITH QUESTIONS OR CONCERNS.  FOLLOW UP IN 6 MOS.

## 2016-11-29 NOTE — Assessment & Plan Note (Signed)
SYMPTOMS CONTROLLED/RESOLVED.  CONTINUE TO MONITOR SYMPTOMS. CONTINUE OMEPRAZOLE.  TAKE 30 MINUTES PRIOR TO YOUR MEALS TWICE DAILY. FOLLOW UP IN 6 MOS.

## 2016-11-29 NOTE — Assessment & Plan Note (Signed)
SYMPTOMS CONTROLLED/RESOLVED.  CONTINUE TO MONITOR SYMPTOMS. WILL RE-START ENTOCORT IF PERSISTENT DIARRHEA. FOLLOW UP IN 6 MOS.

## 2016-11-29 NOTE — Telephone Encounter (Signed)
Referral placed.

## 2016-11-29 NOTE — Progress Notes (Signed)
Subjective:    Patient ID: Tonya Hall, female    DOB: 1950-05-18, 66 y.o.   MRN: [id number]  Tonya Noble, MD   HPI Last ENTOCORT JUL 2018. NOW HAVING NL STOOL. LAST LIVER ENZYMES DUE TO ADJUSTING COSYTYNX. HAD DEPRESSION AND NOW FEELING BETTER. ABLE TO MOVE HANDS BETTER NOW. LAST LIVER PANEL NL. RARE HEARTBURN. MAY WAKE UP AT NIGHT WITH MOUTH FULL OF STUFF/PROJECTILE(1X/MO). MAY EAT LATE SOMETIMES. TROUBLE SWALLOWING PILL SAT NIGHT. IN AM NO TROUBLE.   PT DENIES FEVER, CHILLS, HEMATOCHEZIA, nausea, vomiting, melena, diarrhea, CHEST PAIN, SHORTNESS OF BREATH,  CHANGE IN BOWEL IN HABITS, constipation, OR abdominal pain.  Past Medical History:  Diagnosis Date  . Anemia    at times  . Asthma    Albuterol in haler prn  . Cataract    immature unsure which eye  . Chronic back pain   . Degenerative disk disease    psoriatic  . Diabetes mellitus without complication (HCC)    diet controlled  . Esophageal motility disorder    Non-specific, see modified barium study/speech path, BP  . GERD (gastroesophageal reflux disease)    takes Protonix bid  . History of blood transfusion    did have a rash after receiving the blood  . History of blood transfusion    post c-section  . History of bronchitis    last time early 2014  . HTN (hypertension)    takes Metoprolol daily and SPironolactone daily  . Hypercholesterolemia   . Hyperlipidemia    takes Pravastatin daily  . Hypokalemia   . Lymphocytic colitis 05/26/10 TCS RMR   Responded to Entocort x 3 MOS  . Monilia infection 12/93  . Neuropathy   . Osteoporosis    gets Boniva every 3 months  . Peripheral edema    takes HCTZ daily  . PONV (postoperative nausea and vomiting)   . Psoriatic arthritis (Tonya Hall)    Dr. Katherina Hall  . Raynaud's disease   . Seasonal allergies   . Shortness of breath    with exertion  . SUI (stress urinary incontinence, female)   . Urinary urgency     Past Surgical History:  Procedure Laterality Date    . BIOPSY  04/17/2016   Procedure: BIOPSY;  Surgeon: Tonya Binder, MD;  [facility]: [facility];  Service: Endoscopy;;  random colon bx's  . CESAREAN SECTION  1974, 1978      . COLONOSCOPY  06/19/2008   HER:DEYCX internal hemorrhoids/mild sigmoin colon diverticulosis  . COLONOSCOPY  2012   Dr. Gala Hall: normal rectum, diverticula, lymphocytic colitis   . COLONOSCOPY WITH PROPOFOL N/A 04/17/2016   Procedure: COLONOSCOPY WITH PROPOFOL;  Surgeon: Tonya Binder, MD;  Location: [facility];  Service: Endoscopy;  Laterality: N/A;  900  . DILATION AND CURETTAGE OF UTERUS  1977   spontaneous abortion  . ESOPHAGOGASTRODUODENOSCOPY  06/19/2008   KGY:JEHUDJSH gastritis  . LUMBAR DISC ARTHROPLASTY  7/98  . LUMBAR FUSION  6/03, 11/08, 11/14   L4-5 fusion, L2-3 fusion, L1-2  . TONSILLECTOMY AND ADENOIDECTOMY    . TUBAL LIGATION      Allergies  Allergen Reactions  . Adalimumab Rash and Other (See Comments)    FEVER, Fast pulse  . Imuran [Azathioprine Sodium] Rash    Denies Airway involvement  . Methotrexate Other (See Comments)    Liver Enzyme elevation - after 1 month of use  . Plaquenil [Hydroxychloroquine Sulfate] Rash    Denies airway involvement.   . Sulfonamide  Derivatives Rash    Occurred with Sulfasalazine. Rash only.   . Ceftin [Cefuroxime Axetil] Nausea Only    Upset stomach  . Morphine Nausea And Vomiting    IV morphine caused N and V  After surgery.   Has tolerated [place] in the past     Current Outpatient Prescriptions  Medication Sig Dispense Refill  . albuterol (PROVENTIL HFA;VENTOLIN HFA) 108 (90 BASE) MCG/ACT inhaler Inhale 2 puffs into the lungs every 6 (six) hours as needed for wheezing or shortness of breath.     Marland Kitchen aspirin 81 MG tablet Take 81 mg by mouth daily.      . Calcium Carb-Cholecalciferol (CALCIUM 600 + D PO) Take 1 tablet by mouth 2 (two) times daily.    . cetirizine (ZYRTEC) 10 MG tablet Take 10 mg by mouth daily as needed for allergies.    Tonya Hall  SENSOREADY 300 DOSE 150 MG/ML SOAJ Inject 2 pens into the skin every 30 (thirty) days. Reported on 08/12/2015    . diclofenac sodium (VOLTAREN) 1 % GEL Apply 2 g topically 3 (three) times daily as needed (pain).     . fish oil-omega-3 fatty acids 1000 MG capsule Take 2 g by mouth 2 (two) times daily.     . fluticasone (FLONASE) 50 MCG/ACT nasal spray Place 2 sprays into both nostrils as needed.     . Ginger, Zingiber officinalis, (GINGER ROOT) 500 MG CAPS Take 1 capsule by mouth daily.     . Glucosamine Sulfate 1000 MG CAPS Take 2 capsules (2,000 mg total) by mouth daily.    Marland Kitchen guaiFENesin-dextromethorphan (ROBITUSSIN DM) 100-10 MG/5ML syrup Take 10 mLs by mouth every 6 (six) hours as needed for cough.    Marland Kitchen HUMULIN N KWIKPEN 100 UNIT/ML Kiwkpen Inject 10 Units into the skin at bedtime.    Marland Kitchen HYDROcodone-acetaminophen (NORCO/VICODIN) 5-325 MG tablet Take 1 tablet by by 2-3 times daily as needed for pain    . hydrocortisone sodium succinate (SOLU-CORTEF) 100 MG SOLR injection Inject 100 mg into the vein. Take as needed when unable to swallow prednisone tablets    . Hypromellose (ARTIFICIAL TEARS OP) Apply 1 drop to eye daily as needed (dry eyes).    Marland Kitchen leflunomide (ARAVA) 20 MG tablet Take 1 tablet (20 mg total) by mouth daily.    . metoprolol succinate (TOPROL-XL) 50 MG 24 hr tablet Take 1 tablet (50 mg total) by mouth daily. Take with or immediately following a meal.    . Misc Natural Products (TART CHERRY ADVANCED PO) Take 1 tablet by mouth daily. daily     . Multiple Vitamin (MULTIVITAMIN) tablet Take 1 tablet by mouth daily.      . nortriptyline (PAMELOR) 25 MG capsule TAKE (1) CAPSULE BY MOUTH AT BEDTIME.    Marland Kitchen omeprazole (PRILOSEC) 20 MG capsule Take 1 capsule (20 mg total) by mouth 2 (two) times daily before a meal. (Patient taking differently: Take 20 mg by mouth daily. )    . potassium chloride SA (K-DUR,KLOR-CON) 20 MEQ tablet Take 20 mEq by mouth 2 (two) times daily.    . predniSONE  (DELTASONE) 5 MG tablet Take 1 tablet (5 mg total) by mouth every morning.    Marland Kitchen PROBIOTIC CAPS Take 1 capsule by mouth daily.    . RESTASIS MULTIDOSE 0.05 % ophthalmic emulsion Place 1 drop into both eyes daily.     Marland Kitchen spironolactone (ALDACTONE) 25 MG tablet Take 25 mg by mouth daily.     Haig Prophet COMFORT  PEN NEEDLES 31G X 5 MM MISC     . tiZANidine (ZANAFLEX) 4 MG tablet TAKE (1) TABLET BY MOUTH AT BEDTIME.    Marland Kitchen topiramate (TOPAMAX) 200 MG tablet TAKE (1) TABLET BY MOUTH AT BEDTIME.    . Turmeric 500 MG CAPS Take 1 capsule by mouth daily.     . Zoledronic Acid (RECLAST IV) Inject into the vein.     . budesonide (ENTOCORT EC) 3 MG 24 hr capsule 1 PO QOD STARTING JUL 6. (Patient not taking: Reported on 10/19/2016)    .      . sodium chloride (OCEAN) 0.65 % SOLN nasal spray Place 1 spray into both nostrils as needed for congestion.     Review of Systems PER HPI OTHERWISE ALL SYSTEMS ARE NEGATIVE.    Objective:   Physical Exam  Constitutional: She is oriented to person, place, and time. She appears well-developed and well-nourished. No distress.  HENT:  Head: Normocephalic and atraumatic.  Mouth/Throat: Oropharynx is clear and moist. No oropharyngeal exudate.  Eyes: Pupils are equal, round, and reactive to light. No scleral icterus.  Neck: Normal range of motion. Neck supple.  Cardiovascular: Normal rate, regular rhythm and normal heart sounds.   Pulmonary/Chest: Effort normal and breath sounds normal. No respiratory distress.  Abdominal: Soft. Bowel sounds are normal. She exhibits no distension. There is no tenderness.  Musculoskeletal: She exhibits deformity (BILATERAL HANDS). She exhibits no edema.  Lymphadenopathy:    She has no cervical adenopathy.  Neurological: She is alert and oriented to person, place, and time.  NO  NEW FOCAL DEFICITS   Psychiatric: She has a normal mood and affect.  Vitals reviewed.     Assessment & Plan:

## 2016-11-29 NOTE — Progress Notes (Signed)
On recall  °

## 2016-11-29 NOTE — Progress Notes (Signed)
cc'ed to pcp °

## 2016-11-29 NOTE — Assessment & Plan Note (Signed)
SYMPTOMS FAIRLY WELL CONTROLLED UNLESS SHE EATS LATE AT NIGHT.  EAT 4 TO 6 SMALL MEALS DAILY. 3-4 HRS PRIOR TO GOING TO BED. FOLLOW UP IN 6 MOS.

## 2016-12-04 ENCOUNTER — Other Ambulatory Visit: Payer: Self-pay | Admitting: Rheumatology

## 2016-12-04 NOTE — Telephone Encounter (Signed)
Last visit: 10/19/16 Next Visit: 12/20/16   Okay to refill per Dr. Estanislado Pandy

## 2016-12-10 NOTE — Progress Notes (Signed)
Office Visit Note  Patient: Tonya Hall             Date of Birth: 10-24-1950           MRN: [id number]             PCP: [name], MD Referring: [name], MD Visit Date: 12/20/2016 Occupation: @GUAROCC @    Subjective:  Left hip pain.   History of Present Illness: Tonya Hall is a 66 y.o. female with history of psoriatic arthritis psoriasis osteoarthritis and disc disease. She states she had some relief from injection to the left SI joint and trochanteric bursa which lasted for only short time. Now the pain has recurred. She gets some discomfort in her hands prior to her next Cosyntex dose. She's having some discomfort currently is her next dose is due. She is a still on 5 mg of prednisone. She's unable to taper prednisone due to ongoing aggressive disease. She also takes hydrocodone for the lower back pain. With hydrocodone her pain and on a scale of 0-10 is about 4. Which escalates without hydrocodone. She takes hydrocodone on when necessary basis anywhere from 1-2 tablets a day. And occasionally 3 times a day when she has a flare. She takes Zanaflex at bedtime on when necessary basis. And does not take it with hydrocodone.   Activities of Daily Living:  Patient reports morning stiffness for 2  hours.   Patient Reports nocturnal pain.  Difficulty dressing/grooming: Reports Difficulty climbing stairs: Reports Difficulty getting out of chair: Denies Difficulty using hands for taps, buttons, cutlery, and/or writing: Reports   Review of Systems  Constitutional: Positive for fatigue.  HENT: Positive for mouth dryness.   Eyes: Positive for dryness.  Respiratory: Negative for cough, shortness of breath and difficulty breathing.   Cardiovascular: Negative.  Positive for hypertension. Negative for palpitations and irregular heartbeat.  Gastrointestinal: Negative.  Negative for blood in stool, constipation and diarrhea.  Endocrine: Negative.   Genitourinary: Negative.   Negative for nocturia.  Musculoskeletal: Positive for arthralgias, joint pain, joint swelling, myalgias, muscle weakness, morning stiffness and myalgias.  Skin: Positive for sensitivity to sunlight. Negative for color change, rash, hair loss and ulcers.  Neurological: Negative.  Negative for headaches.  Hematological: Negative.  Negative for swollen glands.  Psychiatric/Behavioral: Positive for sleep disturbance. Negative for depressed mood. The patient is not nervous/anxious.     PMFS History:  Patient Active Problem List   Diagnosis Date Noted  . Transaminitis 06/22/2016  . Collagenous colitis 05/23/2016  . Psoriasis 05/21/2016  . High risk medication use 02/15/2016  . Age-related osteoporosis without current pathological fracture 02/15/2016  . Trochanteric bursitis, left hip 02/15/2016  . Sacroiliitis, not elsewhere classified ([place]) 02/15/2016  . Chronic pain syndrome 02/15/2016  . Abnormal weight gain 06/25/2012  . Hypertension 07/03/2011  . Hypercholesteremia 07/03/2011  . Psoriatic arthritis ([place]) 07/03/2011  . Osteoarthritis of multiple joints 07/03/2011  . Esophageal motility disorder 02/14/2011  . Cough 02/14/2011  . GERD 05/05/2010    Past Medical History:  Diagnosis Date  . Anemia    at times  . Asthma    Albuterol in haler prn  . Cataract    immature unsure which eye  . Chronic back pain   . Degenerative disk disease    psoriatic  . Diabetes mellitus without complication (HCC)    diet controlled  . Esophageal motility disorder    Non-specific, see modified barium study/speech path, BP  . GERD (gastroesophageal reflux disease)  takes Protonix bid  . History of blood transfusion    did have a rash after receiving the blood  . History of blood transfusion    post c-section  . History of bronchitis    last time early 2014  . HTN (hypertension)    takes Metoprolol daily and SPironolactone daily  . Hypercholesterolemia   . Hyperlipidemia    takes  Pravastatin daily  . Hypokalemia   . Lymphocytic colitis 05/26/10 TCS RMR   Responded to Entocort x 3 MOS  . Monilia infection 12/93  . Neuropathy   . Osteoporosis    gets Boniva every 3 months  . Peripheral edema    takes HCTZ daily  . PONV (postoperative nausea and vomiting)   . Psoriatic arthritis ([place])    Dr. Katherina Right  . Raynaud's disease   . Seasonal allergies   . Shortness of breath    with exertion  . SUI (stress urinary incontinence, female)   . Urinary urgency     Family History  Problem Relation Age of Onset  . Rheum arthritis Mother   . Diabetes Mother   . Pancreatitis Mother   . Rheumatic fever Father   . Diabetes Unknown        grandparents  . Skin cancer Unknown        grandfather  . Hypertension Unknown        grandparent  . Congestive Heart Failure Unknown        grandfather  . Parkinson's disease Unknown        grandmother  . Colon cancer Maternal Uncle   . Colon polyps Neg Hx    Past Surgical History:  Procedure Laterality Date  . BIOPSY  04/17/2016   Procedure: BIOPSY;  Surgeon: Danie Binder, MD;  [facility];  Service: Endoscopy;;  random colon bx's  . CESAREAN SECTION  1974, 1978      . COLONOSCOPY  06/19/2008   JOA:CZYSA internal hemorrhoids/mild sigmoin colon diverticulosis  . COLONOSCOPY  2012   Dr. Gala Romney: normal rectum, diverticula, lymphocytic colitis   . COLONOSCOPY WITH PROPOFOL N/A 04/17/2016   Procedure: COLONOSCOPY WITH PROPOFOL;  Surgeon: Danie Binder, MD;  [facility];  Service: Endoscopy;  Laterality: N/A;  900  . DILATION AND CURETTAGE OF UTERUS  1977   spontaneous abortion  . ESOPHAGOGASTRODUODENOSCOPY  06/19/2008   [id number] gastritis  . LUMBAR DISC ARTHROPLASTY  7/98  . LUMBAR FUSION  6/03, 11/08, 11/14   L4-5 fusion, L2-3 fusion, L1-2  . TONSILLECTOMY AND ADENOIDECTOMY    . TUBAL LIGATION     Social History   Social History Narrative  . No narrative on file     Objective: Vital  Signs: BP (!) 197/93   Pulse (!) 105   Resp 16   Ht 4\' 10"  (1.473 m)   Wt 142 lb (64.4 kg)   LMP 02/01/1999   BMI 29.68 kg/m    Physical Exam  Constitutional: She is oriented to person, place, and time. She appears well-developed and well-nourished.  HENT:  Head: Normocephalic and atraumatic.  Eyes: Conjunctivae and EOM are normal.  Neck: Normal range of motion.  Cardiovascular: Normal rate, regular rhythm, normal heart sounds and intact distal pulses.   Pulmonary/Chest: Effort normal and breath sounds normal.  Abdominal: Soft. Bowel sounds are normal.  Lymphadenopathy:    She has no cervical adenopathy.  Neurological: She is alert and oriented to person, place, and time.  Skin: Skin is warm and  dry. Capillary refill takes less than 2 seconds.  Psychiatric: She has a normal mood and affect. Her behavior is normal.  Nursing note and vitals reviewed.    Musculoskeletal Exam: C-spine good range of motion. She has limited range of motion of her thoracic and lumbar spine. She is tenderness over left SI joint. She has tenderness over left trochanteric bursa. Shoulder joints elbow joints wrist joint were good range of motion. She has synovial thickening over bilateral PIP/DIP with subluxation but no synovitis was noted. Hip joints knee joints are good range of motion. She has DIP PIP thickening over her feet. No synovitis was noted. She has some tenderness across multiple PIPs and DIPs.  CDAI Exam: CDAI Homunculus Exam:   Tenderness:  Right hand: 2nd PIP, 3rd PIP, 4th PIP, 5th PIP, 2nd DIP, 3rd DIP, 4th DIP and 5th DIP Left hand: 2nd PIP, 3rd PIP, 4th PIP, 5th PIP, 2nd DIP, 3rd DIP, 4th DIP and 5th DIP Right foot: 3rd MTP, 3rd PIP and 4th PIP Left foot: 3rd MTP and 2nd PIP  Joint Counts:  CDAI Tender Joint count: 8 CDAI Swollen Joint count: 0  Global Assessments:  Patient Global Assessment: 6 Provider Global Assessment: 6  CDAI Calculated Score: 20    Investigation: No  additional findings.TB Gold: negative in 05/2016 CBC Latest Ref Rng & Units 10/12/2016 08/13/2016 06/21/2016  WBC 3.8 - 10.8 K/uL 9.8 12.6(H) 12.2(H)  Hemoglobin 11.7 - 15.5 g/dL 13.6 13.0 12.9  Hematocrit 35.0 - 45.0 % 41.3 39.4 39.7  Platelets 140 - 400 K/uL 263 245 253   CMP Latest Ref Rng & Units 11/20/2016 11/09/2016 10/12/2016  Glucose 65 - 99 mg/dL 233(H) 143(H) 102(H)  BUN 7 - 25 mg/dL 21 23 15   Creatinine 0.50 - 0.99 mg/dL 1.01(H) 1.00(H) 1.01(H)  Sodium 135 - 146 mmol/L 133(L) 134(L) 136  Potassium 3.5 - 5.3 mmol/L 4.4 4.7 4.1  Chloride 98 - 110 mmol/L 101 103 104  CO2 20 - 32 mmol/L 19(L) 20 21  Calcium 8.6 - 10.4 mg/dL 9.3 9.7 9.3  Total Protein 6.1 - 8.1 g/dL 6.4 - 6.4  Total Bilirubin 0.2 - 1.2 mg/dL 0.4 - 0.3  Alkaline Phos 33 - 130 U/L 49 - 43  AST 10 - 35 U/L 31 - 27  ALT 6 - 29 U/L 48(H) - 30(H)    Imaging: No results found.  Speciality Comments: No specialty comments available.    Procedures:  No procedures performed Allergies: Adalimumab; Imuran [azathioprine sodium]; Methotrexate; Plaquenil [hydroxychloroquine sulfate]; Sulfonamide derivatives; Ceftin [cefuroxime axetil]; and Morphine   Assessment / Plan:     Visit Diagnoses: Psoriatic arthritis ([place]): Her psoriatic arthritis is better controlled although she is very aggressive disease and is still requiring low-dose prednisone along with Cosyntex and [place].  Other psoriasis: She has no active psoriasis lesions.  High risk medication use - Cosentyx 300 mg subcutaneous every month, Arava 20 mg by mouth daily, prednisone 5 mg by mouth daily. Her last labs showed mild elevation of LFTs. We will continue to monitor them. She had problems with elevated LFTs in the past.  Sacroiliitis: She continues to have discomfort. Encouraged her to continue with physical therapy. I would await cortisone injection.  Trochanteric bursitis of left hip: ITB and exercise were discussed.  Pain in both hands: Chronic pain  Chronic  pain syndrome - Hydrocodone 5/325 one tablet by mouth 3 times a day when necessary. She takes hydrocodone once or twice a day and occasionally 3 times a  day. She does not fill it every 30 days.  Lower back pain: She takes Topamax for neuralgias. Tizanidine at bedtime for muscle spasms.  History of hypertension: Her blood pressure is very high today. I've advised her to monitor blood pressure closely and follow up with her PCP.  History of hypercholesterolemia  Collagenous colitis: She has not had any recent flares.  History of gastroesophageal reflux (GERD)  Age-related osteoporosis without current pathological fracture - DXA is done by Dr. Chalmers Cater. Her next Reclast infusion is due in December 2018.    Orders: No orders of the defined types were placed in this encounter.  Meds ordered this encounter  Medications  . COSENTYX SENSOREADY 300 DOSE 150 MG/ML SOAJ    Sig: Inject 2 pens into the skin every 30 (thirty) days. Reported on 08/12/2015    Dispense:  6 pen    Refill:  0    Face-to-face time spent with patient was 30 minutes. Greater than 50% of time was spent in counseling and coordination of care.  Follow-Up Instructions: Return in about 5 months (around 05/22/2017) for PSA Psoriasis DDD.   Bo Merino, MD  Note - This record has been created using Editor, commissioning.  Chart creation errors have been sought, but may not always  have been located. Such creation errors do not reflect on  the standard of medical care.

## 2016-12-17 DIAGNOSIS — M461 Sacroiliitis, not elsewhere classified: Secondary | ICD-10-CM | POA: Diagnosis not present

## 2016-12-17 DIAGNOSIS — M6281 Muscle weakness (generalized): Secondary | ICD-10-CM | POA: Diagnosis not present

## 2016-12-17 DIAGNOSIS — R262 Difficulty in walking, not elsewhere classified: Secondary | ICD-10-CM | POA: Diagnosis not present

## 2016-12-17 DIAGNOSIS — M62838 Other muscle spasm: Secondary | ICD-10-CM | POA: Diagnosis not present

## 2016-12-18 ENCOUNTER — Telehealth: Payer: Self-pay | Admitting: *Deleted

## 2016-12-18 NOTE — Telephone Encounter (Signed)
Received fax from [facility], regarding patient being on Tizanidine and Hydrocodone. Advised risk of slowed or difficulty breathing and overdoses. Per Dr. Estanislado Pandy patient should discontinue either the Hydrocodone or the Tizanidine. Patient states she will discontinue the Tizanidine.

## 2016-12-20 ENCOUNTER — Ambulatory Visit (INDEPENDENT_AMBULATORY_CARE_PROVIDER_SITE_OTHER): Payer: Medicare Other | Admitting: Rheumatology

## 2016-12-20 ENCOUNTER — Encounter: Payer: Self-pay | Admitting: Rheumatology

## 2016-12-20 ENCOUNTER — Ambulatory Visit: Payer: Medicare Other | Admitting: Gastroenterology

## 2016-12-20 ENCOUNTER — Telehealth: Payer: Self-pay | Admitting: *Deleted

## 2016-12-20 VITALS — BP 155/79 | HR 92 | Resp 16 | Ht <= 58 in | Wt 142.0 lb

## 2016-12-20 DIAGNOSIS — Z79899 Other long term (current) drug therapy: Secondary | ICD-10-CM

## 2016-12-20 DIAGNOSIS — M79642 Pain in left hand: Secondary | ICD-10-CM

## 2016-12-20 DIAGNOSIS — M79641 Pain in right hand: Secondary | ICD-10-CM

## 2016-12-20 DIAGNOSIS — Z8719 Personal history of other diseases of the digestive system: Secondary | ICD-10-CM

## 2016-12-20 DIAGNOSIS — G894 Chronic pain syndrome: Secondary | ICD-10-CM | POA: Diagnosis not present

## 2016-12-20 DIAGNOSIS — M7062 Trochanteric bursitis, left hip: Secondary | ICD-10-CM | POA: Diagnosis not present

## 2016-12-20 DIAGNOSIS — K52831 Collagenous colitis: Secondary | ICD-10-CM | POA: Diagnosis not present

## 2016-12-20 DIAGNOSIS — L4050 Arthropathic psoriasis, unspecified: Secondary | ICD-10-CM | POA: Diagnosis not present

## 2016-12-20 DIAGNOSIS — Z8639 Personal history of other endocrine, nutritional and metabolic disease: Secondary | ICD-10-CM | POA: Diagnosis not present

## 2016-12-20 DIAGNOSIS — L408 Other psoriasis: Secondary | ICD-10-CM | POA: Diagnosis not present

## 2016-12-20 DIAGNOSIS — M461 Sacroiliitis, not elsewhere classified: Secondary | ICD-10-CM

## 2016-12-20 DIAGNOSIS — Z8679 Personal history of other diseases of the circulatory system: Secondary | ICD-10-CM | POA: Diagnosis not present

## 2016-12-20 DIAGNOSIS — M810 Age-related osteoporosis without current pathological fracture: Secondary | ICD-10-CM | POA: Diagnosis not present

## 2016-12-20 MED ORDER — COSENTYX SENSOREADY (300 MG) 150 MG/ML ~~LOC~~ SOAJ: 2.0000 "pen " | SUBCUTANEOUS | 0 refills | Status: DC

## 2016-12-20 NOTE — Patient Instructions (Signed)
Standing Labs We placed an order today for your standing lab work.    Please come back and get your standing labs in October and every 3 months  We have open lab Monday through Friday from 8:30-11:30 AM and 1:30-4 PM at the office of Dr. Bo Merino.   The office is located at [address] [place], [place], [place] [address] No appointment is necessary.   Labs are drawn by [facility].  You may receive a bill from [facility] for your lab work. If you have any questions regarding directions or hours of operation,  please call [phone].

## 2016-12-20 NOTE — Telephone Encounter (Signed)
Patient needs RF on Cosyntex - patient assistance.

## 2016-12-20 NOTE — Telephone Encounter (Signed)
Refill sent to patient assistance program.

## 2016-12-21 ENCOUNTER — Telehealth: Payer: Self-pay | Admitting: Rheumatology

## 2016-12-21 DIAGNOSIS — I10 Essential (primary) hypertension: Secondary | ICD-10-CM | POA: Diagnosis not present

## 2016-12-21 NOTE — Telephone Encounter (Signed)
Per patient BP listed on AVS states 197/93. Per patient she was told BP was 170/93. Then it was repeated. Patient was wanting to know if BP would be reflected any where else. Patient wanted to verify number since she is going to GP today due to BP. Please call to advise.

## 2016-12-21 NOTE — Telephone Encounter (Signed)
Patient advised that the blood pressure in the chart is documented as 197/93. Patient advised this is the only place that I have to verify this information. Patient states she is going to see her GP today as her blood pressure has continued to stay elevated.

## 2016-12-26 ENCOUNTER — Telehealth: Payer: Self-pay | Admitting: Gastroenterology

## 2016-12-26 DIAGNOSIS — R197 Diarrhea, unspecified: Secondary | ICD-10-CM

## 2016-12-26 NOTE — Telephone Encounter (Signed)
[phone] patient stated that slf would call in entecort if needed,  Has had diarrhea for a week please advise

## 2016-12-27 ENCOUNTER — Other Ambulatory Visit: Payer: Self-pay

## 2016-12-27 DIAGNOSIS — R197 Diarrhea, unspecified: Secondary | ICD-10-CM

## 2016-12-27 NOTE — Telephone Encounter (Addendum)
PLEASE CALL PT. SHE SHOULD SUBMIT STOOL FOR C DIFF PCR AND STOOL PANEL. IF NEGATIVE WILL SEND ENTOCORT. USE IMODIUM ONCE OR TWICE DAILY IF NEEDED TO CONTROL DIARRHEA.

## 2016-12-27 NOTE — Telephone Encounter (Signed)
Pt is aware and will go by the lab to pick up the stool cups.

## 2016-12-27 NOTE — Telephone Encounter (Signed)
LMOM to call.

## 2016-12-27 NOTE — Addendum Note (Signed)
Addended by: Barney Drain L on: 12/27/2016 10:00 AM   Modules accepted: Orders

## 2016-12-27 NOTE — Telephone Encounter (Signed)
Pt called office to inform Tamela Oddi of phone# for [facility]: [phone].

## 2016-12-27 NOTE — Telephone Encounter (Signed)
Have entered the [facility] for pharmacy. Dr. Oneida Alar, please advise!

## 2016-12-27 NOTE — Telephone Encounter (Signed)
Pt called and said she started having some diarrhea last Thursday, and would have 2-3 episodes of diarrhea a day for several days.

## 2016-12-28 ENCOUNTER — Other Ambulatory Visit: Payer: Self-pay | Admitting: Rheumatology

## 2016-12-28 DIAGNOSIS — R197 Diarrhea, unspecified: Secondary | ICD-10-CM | POA: Diagnosis not present

## 2016-12-28 MED ORDER — HYDROCODONE-ACETAMINOPHEN 5-325 MG PO TABS: ORAL_TABLET | ORAL | 0 refills | Status: DC

## 2016-12-28 NOTE — Telephone Encounter (Signed)
Last Visit: 12/20/16 Next Visit: 05/21/17 UDS: 11/20/16 Narc Agreement: 12/20/16 Last Fill: 11/19/16  Okay to refill Hydrocodone?

## 2016-12-28 NOTE — Telephone Encounter (Signed)
Patient left a message requesting a refill on Hydrocodone. Please call to advise.

## 2016-12-28 NOTE — Telephone Encounter (Signed)
ok 

## 2016-12-31 ENCOUNTER — Other Ambulatory Visit: Payer: Self-pay | Admitting: Rheumatology

## 2016-12-31 NOTE — Telephone Encounter (Signed)
Last Visit: 12/20/16 Next Visit: 05/21/17  Okay to refill per Dr. Estanislado Pandy

## 2016-12-31 NOTE — Progress Notes (Signed)
CC'D TO PCP °

## 2016-12-31 NOTE — Progress Notes (Signed)
PT is aware.

## 2017-01-03 LAB — GASTROINTESTINAL PATHOGEN PANEL PCR
C. difficile Tox A/B, PCR: NOT DETECTED
Campylobacter, PCR: NOT DETECTED
Cryptosporidium, PCR: NOT DETECTED
E coli (ETEC) LT/ST PCR: NOT DETECTED
E coli (STEC) stx1/stx2, PCR: NOT DETECTED
E coli 0157, PCR: NOT DETECTED
Giardia lamblia, PCR: NOT DETECTED
Norovirus, PCR: NOT DETECTED
Rotavirus A, PCR: NOT DETECTED
Salmonella, PCR: NOT DETECTED
Shigella, PCR: NOT DETECTED

## 2017-01-03 LAB — CLOSTRIDIUM DIFFICILE BY PCR: Toxigenic C. Difficile by PCR: NOT DETECTED

## 2017-01-04 ENCOUNTER — Telehealth: Payer: Self-pay | Admitting: Gastroenterology

## 2017-01-04 NOTE — Telephone Encounter (Signed)
Pt called to check on her stool results. She said she turned it in last Friday. Please call her at [phone]

## 2017-01-04 NOTE — Telephone Encounter (Addendum)
I told pt that Dr. Oneida Alar has not sent me anything on her GI pathogen results, but it looks negative. Nothing detected. She said Dr. Oneida Alar was going to send in her Entocort if stools were negative and she really needs it. She is having sometimes 6-8 loose stools a day. She is aware that Dr. Oneida Alar is off today and I will send to Walden Field, NP who is doing refills today and see if he can send some in for her. She wants it sent to [facility] and I have entered it for the pharmacy.   Randall Hiss, please see previous note from Dr. Oneida Alar.

## 2017-01-07 ENCOUNTER — Other Ambulatory Visit: Payer: Self-pay | Admitting: Nurse Practitioner

## 2017-01-07 ENCOUNTER — Telehealth: Payer: Self-pay | Admitting: Gastroenterology

## 2017-01-07 DIAGNOSIS — K219 Gastro-esophageal reflux disease without esophagitis: Secondary | ICD-10-CM

## 2017-01-07 DIAGNOSIS — R197 Diarrhea, unspecified: Secondary | ICD-10-CM

## 2017-01-07 NOTE — Telephone Encounter (Signed)
Pt just called to see if the Entocort has been sent in yet. I told her that Dr. Oneida Alar is at the hospital and I will send the message again.

## 2017-01-07 NOTE — Telephone Encounter (Signed)
I spoke to Tonya Hall, he did not get a chance to do this on Friday. Pt has called back this Am and needs the Entocort bad. Said she is having a lot of diarrhea.  Forwarding to Dr. Oneida Alar to advise!

## 2017-01-07 NOTE — Telephone Encounter (Signed)
PATIENT CALLED NEEDING TO SPEAK TO THE NURSE  PLEASE CALL [phone]

## 2017-01-08 ENCOUNTER — Telehealth: Payer: Self-pay | Admitting: Gastroenterology

## 2017-01-08 DIAGNOSIS — I10 Essential (primary) hypertension: Secondary | ICD-10-CM | POA: Diagnosis not present

## 2017-01-08 DIAGNOSIS — E559 Vitamin D deficiency, unspecified: Secondary | ICD-10-CM | POA: Diagnosis not present

## 2017-01-08 DIAGNOSIS — E119 Type 2 diabetes mellitus without complications: Secondary | ICD-10-CM | POA: Diagnosis not present

## 2017-01-08 MED ORDER — BUDESONIDE 3 MG PO CPEP: ORAL_CAPSULE | ORAL | 11 refills | Status: DC

## 2017-01-08 NOTE — Telephone Encounter (Signed)
PT is aware.

## 2017-01-08 NOTE — Telephone Encounter (Signed)
I spoke to Tonya Hall in pharmacy and she said this prescription is fine.  The previous prescription would be cancelled out so everything is fine.

## 2017-01-08 NOTE — Telephone Encounter (Signed)
RX SEN T @0945  TODAY.

## 2017-01-08 NOTE — Telephone Encounter (Signed)
PT was informed earlier today.

## 2017-01-08 NOTE — Addendum Note (Signed)
Addended by: Danie Binder on: 01/08/2017 09:46 AM   Modules accepted: Orders

## 2017-01-08 NOTE — Telephone Encounter (Signed)
REVIEWED-NO ADDITIONAL RECOMMENDATIONS. 

## 2017-01-08 NOTE — Telephone Encounter (Signed)
Pt said that her prescription with [facility] ([place]) wasn't filled out completely and [facility] needs someone to call them at [phone] option 2 to get all the information they need so they can expedite this for the patient.

## 2017-01-08 NOTE — Telephone Encounter (Signed)
PLEASE CALL PT. STOOL; STUDIES ARE NEGATIVE. RX FOR ENTOCORT SENT. 3 PO DAILY FOR 4 WEEK STHEN 2 PO DAILY FOR 4 WEEKS THEN 1 PO DAILY FOR 8 WEEKS.

## 2017-01-08 NOTE — Telephone Encounter (Signed)
Pt is aware.  

## 2017-01-10 ENCOUNTER — Telehealth: Payer: Self-pay | Admitting: Rheumatology

## 2017-01-10 ENCOUNTER — Telehealth: Payer: Self-pay | Admitting: Gastroenterology

## 2017-01-10 NOTE — Telephone Encounter (Signed)
Pt called to say that she still hasn't gotten her prescription from [facility] and paid extra to have it expedited. They are telling her that they haven't received anything from [place] and she is out of her medication. Please call patient at [phone] and call [facility]. Looks like DS spoke to someone there and the prescription was handled then.

## 2017-01-10 NOTE — Telephone Encounter (Signed)
Patient needs rx refill for Cosyntex sent to [facility]. Patient requesting more than one refill due to slowness of the pharmacy.

## 2017-01-10 NOTE — Telephone Encounter (Signed)
LMOM for pt that I called and stayed on the line for 19 min and could not get help. If she will try to call them and let me know what is up.

## 2017-01-10 NOTE — Telephone Encounter (Signed)
Patient called back to cancel message. She spoke with mail order pharmacy, and even though her box said no refills they has refills on file for her.

## 2017-01-14 DIAGNOSIS — E785 Hyperlipidemia, unspecified: Secondary | ICD-10-CM | POA: Diagnosis not present

## 2017-01-14 DIAGNOSIS — I10 Essential (primary) hypertension: Secondary | ICD-10-CM | POA: Diagnosis not present

## 2017-01-15 DIAGNOSIS — E119 Type 2 diabetes mellitus without complications: Secondary | ICD-10-CM | POA: Diagnosis not present

## 2017-01-15 DIAGNOSIS — E7800 Pure hypercholesterolemia, unspecified: Secondary | ICD-10-CM | POA: Diagnosis not present

## 2017-01-15 DIAGNOSIS — I10 Essential (primary) hypertension: Secondary | ICD-10-CM | POA: Diagnosis not present

## 2017-01-15 DIAGNOSIS — M810 Age-related osteoporosis without current pathological fracture: Secondary | ICD-10-CM | POA: Diagnosis not present

## 2017-01-15 DIAGNOSIS — E559 Vitamin D deficiency, unspecified: Secondary | ICD-10-CM | POA: Diagnosis not present

## 2017-01-16 NOTE — Progress Notes (Signed)
Pt is aware.  

## 2017-01-21 DIAGNOSIS — Z23 Encounter for immunization: Secondary | ICD-10-CM | POA: Diagnosis not present

## 2017-01-31 ENCOUNTER — Other Ambulatory Visit: Payer: Self-pay

## 2017-01-31 MED ORDER — HYDROCODONE-ACETAMINOPHEN 5-325 MG PO TABS
ORAL_TABLET | ORAL | 0 refills | Status: DC
Start: 2017-01-31 — End: 2017-03-13

## 2017-01-31 MED ORDER — LEFLUNOMIDE 20 MG PO TABS: 20.0000 mg | ORAL_TABLET | Freq: Every day | ORAL | 2 refills | Status: DC

## 2017-01-31 NOTE — Addendum Note (Signed)
Addended by: Carole Binning on: 01/31/2017 10:41 AM   Modules accepted: Orders

## 2017-01-31 NOTE — Telephone Encounter (Signed)
Patient would like a Rx refill for Hydrocodone and Arava 20mg ?  Cb# [phone].  Please advise.  Thank You

## 2017-01-31 NOTE — Telephone Encounter (Signed)
Last Visit: 12/20/16 Next Visit: 05/21/17 UDS: 11/20/16 Narc Agreement: 12/20/16 Labs: 11/20/16 Stable  Okay to refill Hydrocodone and Arava?

## 2017-01-31 NOTE — Telephone Encounter (Signed)
ok 

## 2017-02-01 ENCOUNTER — Other Ambulatory Visit: Payer: Self-pay | Admitting: Rheumatology

## 2017-02-04 NOTE — Telephone Encounter (Signed)
Last Visit: 12/20/16 Next Visit: 05/21/17  Okay to refill Nortriptyline and topamax?

## 2017-02-13 ENCOUNTER — Other Ambulatory Visit: Payer: Self-pay | Admitting: Rheumatology

## 2017-02-19 ENCOUNTER — Other Ambulatory Visit: Payer: Self-pay | Admitting: *Deleted

## 2017-02-19 ENCOUNTER — Other Ambulatory Visit: Payer: Self-pay

## 2017-02-19 DIAGNOSIS — Z79899 Other long term (current) drug therapy: Secondary | ICD-10-CM

## 2017-02-19 LAB — COMPLETE METABOLIC PANEL WITH GFR
AG Ratio: 1.9 (calc) (ref 1.0–2.5)
ALT: 39 U/L — ABNORMAL HIGH (ref 6–29)
AST: 29 U/L (ref 10–35)
Albumin: 4.2 g/dL (ref 3.6–5.1)
Alkaline phosphatase (APISO): 48 U/L (ref 33–130)
BUN: 10 mg/dL (ref 7–25)
CO2: 26 mmol/L (ref 20–32)
Calcium: 9.4 mg/dL (ref 8.6–10.4)
Chloride: 109 mmol/L (ref 98–110)
Creat: 0.96 mg/dL (ref 0.50–0.99)
GFR, Est African American: 71 mL/min/{1.73_m2} (ref >=60)
GFR, Est Non African American: 61 mL/min/{1.73_m2} (ref >=60)
Globulin: 2.2 g/dL (calc) (ref 1.9–3.7)
Glucose, Bld: 117 mg/dL — ABNORMAL HIGH (ref 65–99)
Potassium: 4.3 mmol/L (ref 3.5–5.3)
Sodium: 141 mmol/L (ref 135–146)
Total Bilirubin: 0.3 mg/dL (ref 0.2–1.2)
Total Protein: 6.4 g/dL (ref 6.1–8.1)

## 2017-02-19 LAB — CBC WITH DIFFERENTIAL/PLATELET
Basophils Absolute: 143 cells/uL (ref 0–200)
Basophils Relative: 1.4 %
Eosinophils Absolute: 806 cells/uL — ABNORMAL HIGH (ref 15–500)
Eosinophils Relative: 7.9 %
HCT: 37.3 % (ref 35.0–45.0)
Hemoglobin: 12.6 g/dL (ref 11.7–15.5)
Lymphs Abs: 2336 cells/uL (ref 850–3900)
MCH: 32.5 pg (ref 27.0–33.0)
MCHC: 33.8 g/dL (ref 32.0–36.0)
MCV: 96.1 fL (ref 80.0–100.0)
MPV: 11.6 fL (ref 7.5–12.5)
Monocytes Relative: 8.3 %
Neutro Abs: 6069 cells/uL (ref 1500–7800)
Neutrophils Relative %: 59.5 %
Platelets: 249 10*3/uL (ref 140–400)
RBC: 3.88 10*6/uL (ref 3.80–5.10)
RDW: 12.3 % (ref 11.0–15.0)
Total Lymphocyte: 22.9 %
WBC mixed population: 847 cells/uL (ref 200–950)
WBC: 10.2 10*3/uL (ref 3.8–10.8)

## 2017-02-26 ENCOUNTER — Telehealth: Payer: Self-pay

## 2017-02-26 NOTE — Telephone Encounter (Signed)
Received a fax from [employer] patient assistance foundation stating that pts enrollment into the program will expire on 04/01/2017. In order to avoid disruption in therapy, pts and healthcare providers must complete their portions of the application. They must be signed and dated. The re-enrollment process will begin January 2nd and applications will be processed in the order they are received. Please allow up to 6 weeks for further communication regarding pts eligibility. *If pt requires refill during re-enrollment period, call [phone] (option 1) to request a refill.   Called patient to update. We will complete the provider portion and fax to the foundation. Left message for patient to call back.   [place], [place], CPhT 11:45 AM

## 2017-02-27 NOTE — Telephone Encounter (Signed)
Patient returned call. She plans to come in on Monday, 03/04/17 to complete and sign application to be submitted. Will fax application once all documents have been signed. Patient voices understanding and denies any questions at this time.   Darshan Solanki, [place], CPhT 10:32 AM

## 2017-03-04 NOTE — Telephone Encounter (Signed)
Patient came by clinic to review and sign her renewal application for 2334. We will fax the application one the provider portion has been signed and completed. We will update once we receive a response. Patient voices understanding and denies any questions at this time.   Tonya Hall, [place], CPhT 9:55 AM

## 2017-03-06 ENCOUNTER — Encounter (HOSPITAL_COMMUNITY)
Admission: RE | Admit: 2017-03-06 | Discharge: 2017-03-06 | Disposition: A | Discharge status: Home / Self Care | Payer: Medicare Other | Source: Ambulatory Visit | Attending: Rheumatology | Admitting: Rheumatology

## 2017-03-06 ENCOUNTER — Other Ambulatory Visit: Payer: Self-pay | Admitting: Rheumatology

## 2017-03-06 DIAGNOSIS — M810 Age-related osteoporosis without current pathological fracture: Secondary | ICD-10-CM | POA: Diagnosis not present

## 2017-03-06 MED ORDER — ZOLEDRONIC ACID 5 MG/100ML IV SOLN
5.0000 mg | Freq: Once | INTRAVENOUS | Status: AC
  Administered 2017-03-06: 5 mg via INTRAVENOUS
  Filled 2017-03-06: qty 100

## 2017-03-06 MED ORDER — SODIUM CHLORIDE 0.9 % IV SOLN
Freq: Once | INTRAVENOUS | Status: AC
  Administered 2017-03-06: 250 mL via INTRAVENOUS

## 2017-03-06 NOTE — Telephone Encounter (Signed)
Last Visit: 12/20/16 Next Visit: 05/21/17  Okay to refill Nortriptyline?

## 2017-03-06 NOTE — Telephone Encounter (Signed)
ok 

## 2017-03-13 ENCOUNTER — Other Ambulatory Visit: Payer: Self-pay

## 2017-03-13 NOTE — Telephone Encounter (Signed)
Patient would like a Rx refill on Hydrocodone.  WL#[phone].  Please advise. Thank you.

## 2017-03-13 NOTE — Telephone Encounter (Signed)
ok 

## 2017-03-13 NOTE — Telephone Encounter (Signed)
Last Visit: 12/20/16 Next Visit: 05/21/17 UDS: 11/20/16 Narc Agreement: 12/20/16  Okay to refill Hydrocodone?

## 2017-03-13 NOTE — Addendum Note (Signed)
Addended by: Carole Binning on: 03/13/2017 03:42 PM   Modules accepted: Orders

## 2017-03-14 DIAGNOSIS — E119 Type 2 diabetes mellitus without complications: Secondary | ICD-10-CM | POA: Diagnosis not present

## 2017-03-14 DIAGNOSIS — H25043 Posterior subcapsular polar age-related cataract, bilateral: Secondary | ICD-10-CM | POA: Diagnosis not present

## 2017-03-14 DIAGNOSIS — H524 Presbyopia: Secondary | ICD-10-CM | POA: Diagnosis not present

## 2017-03-14 DIAGNOSIS — H5201 Hypermetropia, right eye: Secondary | ICD-10-CM | POA: Diagnosis not present

## 2017-03-14 MED ORDER — HYDROCODONE-ACETAMINOPHEN 5-325 MG PO TABS: ORAL_TABLET | ORAL | 0 refills | Status: DC

## 2017-03-14 NOTE — Telephone Encounter (Signed)
Called [employer] to check status of patients application. Spoke with Sharyn Lull who states that they have received all documents and her application is currently in the insurance verification stage. Her application will be processed further on 04/02/17. Will update once we receive a response

## 2017-04-02 DIAGNOSIS — R519 Headache, unspecified: Secondary | ICD-10-CM

## 2017-04-02 HISTORY — DX: Headache, unspecified: R51.9

## 2017-04-03 ENCOUNTER — Encounter: Payer: Self-pay | Admitting: Gastroenterology

## 2017-04-06 ENCOUNTER — Other Ambulatory Visit: Payer: Self-pay | Admitting: Rheumatology

## 2017-04-08 NOTE — Telephone Encounter (Signed)
Last Visit: 12/20/16 Next Visit: 05/21/17  Okay to refill per Dr. Estanislado Pandy

## 2017-04-15 ENCOUNTER — Other Ambulatory Visit: Payer: Self-pay | Admitting: Rheumatology

## 2017-04-15 NOTE — Telephone Encounter (Signed)
Last Visit: 12/20/16 Next Visit: 05/21/17

## 2017-04-18 DIAGNOSIS — E785 Hyperlipidemia, unspecified: Secondary | ICD-10-CM | POA: Diagnosis not present

## 2017-04-24 ENCOUNTER — Other Ambulatory Visit: Payer: Self-pay | Admitting: Rheumatology

## 2017-04-24 MED ORDER — HYDROCODONE-ACETAMINOPHEN 5-325 MG PO TABS: ORAL_TABLET | ORAL | 0 refills | Status: DC

## 2017-04-24 NOTE — Telephone Encounter (Signed)
Last Visit: 12/20/16 Next Visit: 05/21/17 UDS: 11/20/16 Narc Agreement: 12/20/16  Okay to refill Hydrocodone?

## 2017-04-24 NOTE — Telephone Encounter (Signed)
Patient left a voicemail requesting a prescription refill for Hydrocodone.  Patient's CB# [phone]

## 2017-05-01 DIAGNOSIS — R05 Cough: Secondary | ICD-10-CM | POA: Diagnosis not present

## 2017-05-01 DIAGNOSIS — E785 Hyperlipidemia, unspecified: Secondary | ICD-10-CM | POA: Diagnosis not present

## 2017-05-03 ENCOUNTER — Other Ambulatory Visit: Payer: Self-pay | Admitting: Rheumatology

## 2017-05-03 MED ORDER — LEFLUNOMIDE 20 MG PO TABS: 20.0000 mg | ORAL_TABLET | Freq: Every day | ORAL | 2 refills | Status: DC

## 2017-05-03 NOTE — Progress Notes (Addendum)
Office Visit Note  Patient: Tonya Hall             Date of Birth: 1950-06-24           MRN: [id number]             PCP: [name], MD Referring: [name], MD Visit Date: 05/09/2017 Occupation: @GUAROCC @    Subjective:  Hand pain and swelling    History of Present Illness: BRANDEE MARKIN is a 67 y.o. female with history of psoriatic arthritis, osteoarthritis, osteoporosis, chronic pain syndrome.  Patient states she is on Cosentyx once a month and Arava 20 mg daily. She is on prednisone 5 mg daily.  She reports that her Cosentyx is not lasting her a whole month.  She experiences increased pain and swelling in her bilateral hands exactly 2 weeks after her previous injection.  She denies any pain or swelling in her feet.  She denies any SI joint pain, achilles tendonitis, or plantar fasciitis.  She denies any psoriasis.  She states her left trochanteric bursitis is improving.  She had labs performed with her PCP 2 weeks ago per patient.  She is going to have the results faxed over.  She has had bronchitis for the past 2 weeks and has been on a dose pack.  She continues to have a cough but no fevers.  She has started to feel a lot better.  She denies discontinuing her [place].   She states that she had her reclast injection in December 2018.  She takes vitamin D and Calcium daily.   Activities of Daily Living:  Patient reports morning stiffness for 1-2 hours.   Patient Denies nocturnal pain.  Difficulty dressing/grooming: Denies Difficulty climbing stairs: Reports Difficulty getting out of chair: Reports Difficulty using hands for taps, buttons, cutlery, and/or writing: Reports   Review of Systems  Constitutional: Positive for fatigue. Negative for weakness.  HENT: Positive for mouth dryness. Negative for mouth sores and nose dryness.   Eyes: Positive for dryness. Negative for pain, redness and visual disturbance.  Respiratory: Positive for cough (Recent bronchitis). Negative for  hemoptysis, shortness of breath and difficulty breathing.   Cardiovascular: Positive for hypertension. Negative for chest pain, palpitations, irregular heartbeat and swelling in legs/feet.  Gastrointestinal: Negative for blood in stool, constipation and diarrhea.  Endocrine: Negative for increased urination.  Genitourinary: Negative for painful urination.  Musculoskeletal: Positive for arthralgias, joint pain, joint swelling and morning stiffness. Negative for myalgias, muscle weakness, muscle tenderness and myalgias.  Skin: Negative for color change (Raynaud's), pallor, rash, hair loss, nodules/bumps, redness, skin tightness, ulcers and sensitivity to sunlight.  Neurological: Negative for dizziness, numbness and headaches.  Hematological: Negative for swollen glands.  Psychiatric/Behavioral: Negative for depressed mood and sleep disturbance. The patient is not nervous/anxious.     PMFS History:  Patient Active Problem List   Diagnosis Date Noted  . Transaminitis 06/22/2016  . Collagenous colitis 05/23/2016  . Psoriasis 05/21/2016  . High risk medication use 02/15/2016  . Age-related osteoporosis without current pathological fracture 02/15/2016  . Trochanteric bursitis, left hip 02/15/2016  . Sacroiliitis, not elsewhere classified ([place]) 02/15/2016  . Chronic pain syndrome 02/15/2016  . Abnormal weight gain 06/25/2012  . Hypertension 07/03/2011  . Hypercholesteremia 07/03/2011  . Psoriatic arthritis ([place]) 07/03/2011  . Osteoarthritis of multiple joints 07/03/2011  . Esophageal motility disorder 02/14/2011  . Cough 02/14/2011  . GERD 05/05/2010    Past Medical History:  Diagnosis Date  . Anemia  at times  . Asthma    Albuterol in haler prn  . Cataract    immature unsure which eye  . Chronic back pain   . Degenerative disk disease    psoriatic  . Diabetes mellitus without complication (HCC)    diet controlled  . Esophageal motility disorder    Non-specific, see modified  barium study/speech path, BP  . GERD (gastroesophageal reflux disease)    takes Protonix bid  . History of blood transfusion    did have a rash after receiving the blood  . History of blood transfusion    post c-section  . History of bronchitis    last time early 2014  . HTN (hypertension)    takes Metoprolol daily and SPironolactone daily  . Hypercholesterolemia   . Hyperlipidemia    takes Pravastatin daily  . Hypokalemia   . Lymphocytic colitis 05/26/10 TCS RMR   Responded to Entocort x 3 MOS  . Monilia infection 12/93  . Neuropathy   . Osteoporosis    gets Boniva every 3 months  . Peripheral edema    takes HCTZ daily  . PONV (postoperative nausea and vomiting)   . Psoriatic arthritis ([place])    Dr. Katherina Right  . Raynaud's disease   . Seasonal allergies   . Shortness of breath    with exertion  . SUI (stress urinary incontinence, female)   . Urinary urgency     Family History  Problem Relation Age of Onset  . Rheum arthritis Mother   . Diabetes Mother   . Pancreatitis Mother   . Rheumatic fever Father   . Diabetes Unknown        grandparents  . Skin cancer Unknown        grandfather  . Hypertension Unknown        grandparent  . Congestive Heart Failure Unknown        grandfather  . Parkinson's disease Unknown        grandmother  . Colon cancer Maternal Uncle   . Colon polyps Neg Hx    Past Surgical History:  Procedure Laterality Date  . BIOPSY  04/17/2016   Procedure: BIOPSY;  Surgeon: Danie Binder, MD;  Location: [facility];  Service: Endoscopy;;  random colon bx's  . CESAREAN SECTION  1974, 1978      . COLONOSCOPY  06/19/2008   LHT:DSKAJ internal hemorrhoids/mild sigmoin colon diverticulosis  . COLONOSCOPY  2012   Dr. Gala Romney: normal rectum, diverticula, lymphocytic colitis   . COLONOSCOPY WITH PROPOFOL N/A 04/17/2016   Procedure: COLONOSCOPY WITH PROPOFOL;  Surgeon: Danie Binder, MD;  Location: [facility];  Service: Endoscopy;  Laterality: N/A;   900  . DILATION AND CURETTAGE OF UTERUS  1977   spontaneous abortion  . ESOPHAGOGASTRODUODENOSCOPY  06/19/2008   GOT:LXBWIOMB gastritis  . LUMBAR DISC ARTHROPLASTY  7/98  . LUMBAR FUSION  6/03, 11/08, 11/14   L4-5 fusion, L2-3 fusion, L1-2  . TONSILLECTOMY AND ADENOIDECTOMY    . TUBAL LIGATION     Social History   Social History Narrative  . Not on file     Objective: Vital Signs: BP 136/74 (BP Location: Left Arm, Patient Position: Sitting, Cuff Size: Normal)   Pulse 81   Resp 16   Ht 4' 11.06" (1.5 m)   Wt 139 lb 8 oz (63.3 kg)   LMP 02/01/1999   BMI 28.12 kg/m    Physical Exam  Constitutional: She is oriented to person, place, and time.  She appears well-developed and well-nourished.  HENT:  Head: Normocephalic and atraumatic.  Eyes: Conjunctivae and EOM are normal.  Neck: Normal range of motion.  Cardiovascular: Normal rate, regular rhythm, normal heart sounds and intact distal pulses.  Pulmonary/Chest: Effort normal and breath sounds normal.  Abdominal: Soft. Bowel sounds are normal.  Lymphadenopathy:    She has no cervical adenopathy.  Neurological: She is alert and oriented to person, place, and time.  Skin: Skin is warm and dry. Capillary refill takes less than 2 seconds.  Nail pitting in bilateral thumbnails No psoriasis   Psychiatric: She has a normal mood and affect. Her behavior is normal.  Nursing note and vitals reviewed.    Musculoskeletal Exam: C-spine limited ROM with lateral rotation to the left.  Thoracic and lumbar spine good ROM.  No midline spinal tenderness.  She has mild SI joint tenderness.  Shoulder joints, elbow joints, wrist joints, MCPs, PIPs, and DIPs good ROM.  She has synovitis in her right 2nd and 3rd PIP joints and left 2nd PIP joint.  All PIP and DIP synovial thickening.  CMC joint synovial thickening.  She has tenderness of PIP joints.  Hip joints, knee joints, ankle joints, MTPs, PIPs, and DIPs good ROM with no synovitis.  No warmth or  effusion of knees.  She has bilateral knee crepitus.  Tenderness of left trochanteric bursa.   CDAI Exam: CDAI Homunculus Exam:   Tenderness:  Right hand: 2nd MCP, 2nd PIP and 3rd PIP Left hand: 2nd PIP and 3rd PIP  Swelling:  Right hand: 2nd PIP and 3rd PIP Left hand: 2nd PIP  Joint Counts:  CDAI Tender Joint count: 5 CDAI Swollen Joint count: 3  Global Assessments:  Patient Global Assessment: 6 Provider Global Assessment: 6  CDAI Calculated Score: 20    Investigation: No additional findings.  TB gold negative 06/21/16 CBC Latest Ref Rng & Units 02/19/2017 10/12/2016 08/13/2016  WBC 3.8 - 10.8 Thousand/uL 10.2 9.8 12.6(H)  Hemoglobin 11.7 - 15.5 g/dL 12.6 13.6 13.0  Hematocrit 35.0 - 45.0 % 37.3 41.3 39.4  Platelets 140 - 400 Thousand/uL 249 263 245   CMP Latest Ref Rng & Units 02/19/2017 11/20/2016 11/09/2016  Glucose 65 - 99 mg/dL 117(H) 233(H) 143(H)  BUN 7 - 25 mg/dL 10 21 23   Creatinine 0.50 - 0.99 mg/dL 0.96 1.01(H) 1.00(H)  Sodium 135 - 146 mmol/L 141 133(L) 134(L)  Potassium 3.5 - 5.3 mmol/L 4.3 4.4 4.7  Chloride 98 - 110 mmol/L 109 101 103  CO2 20 - 32 mmol/L 26 19(L) 20  Calcium 8.6 - 10.4 mg/dL 9.4 9.3 9.7  Total Protein 6.1 - 8.1 g/dL 6.4 6.4 -  Total Bilirubin 0.2 - 1.2 mg/dL 0.3 0.4 -  Alkaline Phos 33 - 130 U/L - 49 -  AST 10 - 35 U/L 29 31 -  ALT 6 - 29 U/L 39(H) 48(H) -    Imaging: No results found.  Speciality Comments: No specialty comments available.    Procedures:  No procedures performed Allergies: Adalimumab; Imuran [azathioprine sodium]; Methotrexate; Plaquenil [hydroxychloroquine sulfate]; Sulfonamide derivatives; Ceftin [cefuroxime axetil]; and Morphine   Assessment / Plan:     Visit Diagnoses: Psoriatic arthritis ([place]): She has synovitis of her right 2nd and 3rd PIP joints and left 2nd PIP joint.  She had tenderness of all PIP joints.  She is on Arava 20 mg daily and Cosentyx 300 dose every 30 days.  She reports her Cosentyx dose is  only lasting her 2 weeks.  She starts to have increased hand pain and swelling 2 weeks after her injections.  We discussed splitting her dose to 1 pen every other week.  Dr. Estanislado Pandy agrees with this plan.  She will continue on Arava 20 mg daily.  She had labs with her PCP 2 weeks ago.  She is going to have these results faxed to our office.     Psoriasis: No active psoriasis.  She has nail pitting in bilateral thumbnails.   High risk medication use - Cosentyx, Arava 20 mg, Prednisone 5 mg-CBC and CMP were drawn at PCPs office about 2 weeks ago.  She is going to have PCP fax results to our office.  TB gold future order was placed.   She will return for labs in April and every 3 months. TB gold 06/21/16. UDS was obtained today.  Repeat UDS every 6 months.  - Plan: QuantiFERON-TB Gold Plus, Pain Mgmt, Profile 5 w/Conf, U  Primary osteoarthritis involving multiple joints: She has PIP and DIP synovial thickening in her hands and feet.  She has bilateral knee crepitus.    Age-related osteoporosis without current pathological fracture: Her last Reclast infusion was December 2018.  She takes vitamin D and calcium supplements daily.    Trochanteric bursitis, left hip: Improving.  She still has point tenderness, but she states the pain has improved.    Chronic pain syndrome - Norco, Zanaflex. UDS updated today 05/09/17.  Narcotic agreement: 12/20/16  Other medical conditions are listed as follows:   Essential hypertension  Esophageal motility disorder  History of gastroesophageal reflux (GERD)    Orders: Orders Placed This Encounter  Procedures  . QuantiFERON-TB Gold Plus  . Pain Mgmt, Profile 5 w/Conf, U   No orders of the defined types were placed in this encounter.   Face-to-face time spent with patient was 30 minutes. Greater than 50% of time was spent in counseling and coordination of care.  Follow-Up Instructions: Return in about 5 months (around 10/06/2017) for Psoriatic arthritis,  Osteoporosis.   Ofilia Neas, PA-C  Note - This record has been created using Dragon software.  Chart creation errors have been sought, but may not always  have been located. Such creation errors do not reflect on  the standard of medical care.

## 2017-05-03 NOTE — Telephone Encounter (Signed)
Last Visit: 12/20/16 Next Visit: 05/21/17 Labs: 02/19/17 WNL Patient also needs refill on [place] to refill per Dr. Estanislado Pandy

## 2017-05-09 ENCOUNTER — Encounter: Payer: Self-pay | Admitting: Physician Assistant

## 2017-05-09 ENCOUNTER — Ambulatory Visit: Payer: PPO | Admitting: Physician Assistant

## 2017-05-09 VITALS — BP 136/74 | HR 81 | Resp 16 | Ht 59.06 in | Wt 139.5 lb

## 2017-05-09 DIAGNOSIS — G894 Chronic pain syndrome: Secondary | ICD-10-CM | POA: Diagnosis not present

## 2017-05-09 DIAGNOSIS — Z8719 Personal history of other diseases of the digestive system: Secondary | ICD-10-CM | POA: Diagnosis not present

## 2017-05-09 DIAGNOSIS — M810 Age-related osteoporosis without current pathological fracture: Secondary | ICD-10-CM

## 2017-05-09 DIAGNOSIS — M150 Primary generalized (osteo)arthritis: Secondary | ICD-10-CM

## 2017-05-09 DIAGNOSIS — Z79899 Other long term (current) drug therapy: Secondary | ICD-10-CM

## 2017-05-09 DIAGNOSIS — L4050 Arthropathic psoriasis, unspecified: Secondary | ICD-10-CM | POA: Diagnosis not present

## 2017-05-09 DIAGNOSIS — M7062 Trochanteric bursitis, left hip: Secondary | ICD-10-CM

## 2017-05-09 DIAGNOSIS — M8949 Other hypertrophic osteoarthropathy, multiple sites: Secondary | ICD-10-CM

## 2017-05-09 DIAGNOSIS — L409 Psoriasis, unspecified: Secondary | ICD-10-CM | POA: Diagnosis not present

## 2017-05-09 DIAGNOSIS — K224 Dyskinesia of esophagus: Secondary | ICD-10-CM | POA: Diagnosis not present

## 2017-05-09 DIAGNOSIS — I10 Essential (primary) hypertension: Secondary | ICD-10-CM

## 2017-05-09 DIAGNOSIS — M159 Polyosteoarthritis, unspecified: Secondary | ICD-10-CM

## 2017-05-09 NOTE — Patient Instructions (Signed)
Standing Labs We placed an order today for your standing lab work.    Please come back and get your standing labs in April and every 3 months    CBC, CMP, and TB gold   We have open lab Monday through Friday from 8:30-11:30 AM and 1:30-4 PM at the office of Dr. Bo Merino.   The office is located at [address] [place], [place], [place] [address] No appointment is necessary.   Labs are drawn by [facility].  You may receive a bill from [facility] for your lab work. If you have any questions regarding directions or hours of operation,  please call [phone].

## 2017-05-12 LAB — PAIN MGMT, PROFILE 5 W/CONF, U
Amphetamines: NEGATIVE ng/mL (ref <500)
Barbiturates: NEGATIVE ng/mL (ref <300)
Benzodiazepines: NEGATIVE ng/mL (ref <100)
Cocaine Metabolite: NEGATIVE ng/mL (ref <150)
Codeine: NEGATIVE ng/mL (ref <50)
Creatinine: 49.8 mg/dL
Hydrocodone: 352 ng/mL — ABNORMAL HIGH (ref <50)
Hydromorphone: 80 ng/mL — ABNORMAL HIGH (ref <50)
Marijuana Metabolite: NEGATIVE ng/mL (ref <20)
Methadone Metabolite: NEGATIVE ng/mL (ref <100)
Morphine: NEGATIVE ng/mL (ref <50)
Norhydrocodone: 692 ng/mL — ABNORMAL HIGH (ref <50)
Opiates: POSITIVE ng/mL — AB (ref <100)
Oxidant: NEGATIVE ug/mL (ref <200)
Oxycodone: NEGATIVE ng/mL (ref <100)
pH: 5.99 (ref 4.5–9.0)

## 2017-05-12 NOTE — Progress Notes (Signed)
Consistent with treatment

## 2017-05-15 ENCOUNTER — Ambulatory Visit: Payer: PPO | Admitting: Gastroenterology

## 2017-05-15 ENCOUNTER — Telehealth: Payer: Self-pay

## 2017-05-15 ENCOUNTER — Encounter: Payer: Self-pay | Admitting: Gastroenterology

## 2017-05-15 DIAGNOSIS — K219 Gastro-esophageal reflux disease without esophagitis: Secondary | ICD-10-CM | POA: Diagnosis not present

## 2017-05-15 DIAGNOSIS — K52831 Collagenous colitis: Secondary | ICD-10-CM | POA: Diagnosis not present

## 2017-05-15 MED ORDER — LIDOCAINE VISCOUS 2 % MT SOLN: OROMUCOSAL | 1 refills | Status: DC

## 2017-05-15 NOTE — Progress Notes (Signed)
Subjective:    Patient ID: Tonya Hall, female    DOB: 06-25-1950, 67 y.o.   MRN: [id number]  [name], MD   HPI BOWELS ARE GOOD. FINISHED MEDS ABOUT ONE WEEK AGO. LAST LIVER PANEL 2 WEEKS AGO NL. No questions or concerns. WAS #7 AND NOW 1-2 #4/DAY. APPETITE: TOO GOOD. ENERGY LEVEL: OK. ON MEDROL DOSE PACK-FINISHED 3-4 DAYS AGO. RARE HEARTBURN: SEVERAL HOURS IN A ROW-ONCE Q2-3 WEEKS. UNSURE WHAT THE TRIGGERS IS. MOSTLY IN THE AM. WAS HAVING LEFT BREAT/CHEST PAIN AFTER COUGHING. HURT FOR A WEEK BUT THAT'S GONE. RUNNY NOSE AND SORE IN NOSE OFF AND ON FOR 2 YEARS.   PT DENIES FEVER, CHILLS, HEMATOCHEZIA, HEMATEMESIS, nausea, vomiting, melena, SHORTNESS OF BREATH,  CHANGE IN BOWEL IN HABITS, constipation, abdominal pain, OR problems swallowing.  Past Medical History:  Diagnosis Date  . Anemia    at times  . Asthma    Albuterol in haler prn  . Cataract    immature unsure which eye  . Chronic back pain   . Degenerative disk disease    psoriatic  . Diabetes mellitus without complication (HCC)    diet controlled  . Esophageal motility disorder    Non-specific, see modified barium study/speech path, BP  . GERD (gastroesophageal reflux disease)    takes Protonix bid  . History of blood transfusion    did have a rash after receiving the blood  . History of blood transfusion    post c-section  . History of bronchitis    last time early 2014  . HTN (hypertension)    takes Metoprolol daily and SPironolactone daily  . Hypercholesterolemia   . Hyperlipidemia    takes Pravastatin daily  . Hypokalemia   . Lymphocytic colitis 05/26/10 TCS RMR   Responded to Entocort x 3 MOS  . Monilia infection 12/93  . Neuropathy   . Osteoporosis    gets Boniva every 3 months  . Peripheral edema    takes HCTZ daily  . PONV (postoperative nausea and vomiting)   . Psoriatic arthritis ([place])    Dr. Katherina Right  . Raynaud's disease   . Seasonal allergies   . Shortness of breath    with exertion   . SUI (stress urinary incontinence, female)   . Urinary urgency     Past Surgical History:  Procedure Laterality Date  . BIOPSY  04/17/2016   Procedure: BIOPSY;  Surgeon: Danie Binder, MD;  Location: [facility];  Service: Endoscopy;;  random colon bx's  . CESAREAN SECTION  1974, 1978      . COLONOSCOPY  06/19/2008   [facility] internal hemorrhoids/mild sigmoin colon diverticulosis  . COLONOSCOPY  2012   Dr. Gala Romney: normal rectum, diverticula, lymphocytic colitis   . COLONOSCOPY WITH PROPOFOL N/A 04/17/2016   Procedure: COLONOSCOPY WITH PROPOFOL;  Surgeon: Danie Binder, MD;  Location: [facility];  Service: Endoscopy;  Laterality: N/A;  900  . DILATION AND CURETTAGE OF UTERUS  1977   spontaneous abortion  . ESOPHAGOGASTRODUODENOSCOPY  06/19/2008   BTD:VVOHYWVP gastritis  . LUMBAR DISC ARTHROPLASTY  7/98  . LUMBAR FUSION  6/03, 11/08, 11/14   L4-5 fusion, L2-3 fusion, L1-2  . TONSILLECTOMY AND ADENOIDECTOMY    . TUBAL LIGATION      Allergies  Allergen Reactions  . Adalimumab Rash and Other (See Comments)    FEVER, Fast pulse  . Imuran [Azathioprine Sodium] Rash    Denies Airway involvement  . Methotrexate Other (See Comments)  Liver Enzyme elevation - after 1 month of use  . Plaquenil [Hydroxychloroquine Sulfate] Rash    Denies airway involvement.   . Sulfonamide Derivatives Rash    Occurred with Sulfasalazine. Rash only.   . Ceftin [Cefuroxime Axetil] Nausea Only    Upset stomach  . Morphine Nausea And Vomiting    IV morphine caused N and V  After surgery.   Has tolerated Kadian in the past    Current Outpatient Medications  Medication Sig Dispense Refill  . albuterol (PROVENTIL HFA;VENTOLIN HFA) 108 (90 BASE) MCG/ACT inhaler Inhale 2 puffs into the lungs every 6 (six) hours as needed for wheezing or shortness of breath.     Marland Kitchen aspirin 81 MG tablet Take 81 mg by mouth daily.      . Calcium Carb-Cholecalciferol (CALCIUM 600 + D PO) Take 1 tablet by mouth 2 (two)  times daily.    . cetirizine (ZYRTEC) 10 MG tablet Take 10 mg by mouth daily as needed for allergies.    Hillary Bow SENSOREADY 300 DOSE 150 MG/ML SOAJ Inject 2 pens into the skin every 30 (thirty) days. Reported on 08/12/2015    . diclofenac sodium (VOLTAREN) 1 % GEL Apply 2 g topically 3 (three) times daily as needed (pain).     Marland Kitchen ezetimibe (ZETIA) 10 MG tablet Take 10 mg by mouth daily.    . fish oil-omega-3 fatty acids 1000 MG capsule Take 2 g by mouth 2 (two) times daily.     . Ginger, Zingiber officinalis, (GINGER ROOT) 500 MG CAPS Take 1 capsule by mouth daily.     . Glucosamine Sulfate 1000 MG CAPS Take 2 capsules (2,000 mg total) by mouth daily.    Marland Kitchen guaiFENesin-dextromethorphan (ROBITUSSIN DM) 100-10 MG/5ML syrup Take 10 mLs by mouth every 6 (six) hours as needed for cough.    Marland Kitchen HUMULIN N KWIKPEN 100 UNIT/ML Kiwkpen Inject 10 Units into the skin at bedtime.    Marland Kitchen HYDROcodone-acetaminophen (NORCO/VICODIN) 5-325 MG tablet Take 1 tablet by by 2-3 times daily as needed for pain    . hydrocortisone sodium succinate (SOLU-CORTEF) 100 MG SOLR injection Inject 100 mg into the vein. Take as needed when unable to swallow prednisone tablets    . Hypromellose (ARTIFICIAL TEARS OP) Apply 1 drop to eye daily as needed (dry eyes).    Marland Kitchen leflunomide (ARAVA) 20 MG tablet Take 1 tablet (20 mg total) by mouth daily. 30 tablet 2  . losartan (COZAAR) 50 MG tablet Take 50 mg by mouth daily.    . metoprolol succinate (TOPROL-XL) 50 MG 24 hr tablet Take 1 tablet (50 mg total) by mouth daily. Take with or immediately following a meal.    . Misc Natural Products (TART CHERRY ADVANCED PO) Take 1 tablet by mouth daily. daily     . Multiple Vitamin (MULTIVITAMIN) tablet Take 1 tablet by mouth daily.      . nortriptyline (PAMELOR) 25 MG capsule TAKE (1) CAPSULE BY MOUTH AT BEDTIME.    Marland Kitchen omeprazole (PRILOSEC) 20 MG capsule TAKE ONE CAPSULE BY MOUTH TWICE DAILY.    Marland Kitchen potassium chloride SA (K-DUR,KLOR-CON) 20 MEQ tablet  Take 20 mEq by mouth daily.     .      . RESTASIS MULTIDOSE 0.05 % ophthalmic emulsion Place 1 drop into both eyes daily.     Marland Kitchen spironolactone (ALDACTONE) 25 MG tablet Take 25 mg by mouth daily.     . SURE COMFORT PEN NEEDLES 31G X 5 MM MISC     .  tiZANidine (ZANAFLEX) 4 MG tablet TAKE (1) TABLET BY MOUTH AT BEDTIME. (Patient taking differently: TAKE (1) TABLET BY MOUTH AT BEDTIME. AS NEEDED)    . topiramate (TOPAMAX) 200 MG tablet TAKE (1) TABLET BY MOUTH AT BEDTIME.    . Turmeric 500 MG CAPS Take 1 capsule by mouth daily.     . Zoledronic Acid (RECLAST IV) Inject into the vein.     .      . fluticasone (FLONASE) 50 MCG/ACT nasal spray Place 2 sprays into both nostrils as needed.     Marland Kitchen PROBIOTIC CAPS Take 1 capsule by mouth daily.     Review of Systems PER HPI OTHERWISE ALL SYSTEMS ARE NEGATIVE.    Objective:   Physical Exam  Constitutional: She is oriented to person, place, and time. She appears well-developed and well-nourished. No distress.  HENT:  Head: Normocephalic and atraumatic.  Mouth/Throat: Oropharynx is clear and moist. No oropharyngeal exudate.  Eyes: Pupils are equal, round, and reactive to light. No scleral icterus.  Neck: Normal range of motion. Neck supple.  Cardiovascular: Normal rate, regular rhythm and normal heart sounds.  Pulmonary/Chest: Effort normal and breath sounds normal. No respiratory distress.  Abdominal: Soft. Bowel sounds are normal. She exhibits no distension. There is no tenderness.  Musculoskeletal: She exhibits deformity. She exhibits no edema.  Lymphadenopathy:    She has no cervical adenopathy.  Neurological: She is alert and oriented to person, place, and time.  Psychiatric: She has a normal mood and affect.  Vitals reviewed.     Assessment & Plan:

## 2017-05-15 NOTE — Patient Instructions (Addendum)
Please CALL WITH QUESTIONS OR CONCERNS.  DRINK WATER TO KEEP YOUR URINE LIGHT YELLOW  Follow a high fiber diet. AVOID ITEMS THAT CAUSE BLOATING & GAS.  For heartburn flares, USE VISCOUS LIDOCAINE 2 TSP every 4 hours whne needed FOR CHEST, OR UPPER ABDOMINAL PAIN OR HEARTBURN. USE NO MORE THAN 8 DOSES A DAY. IT WILL MAKE HER MOUTH, ESOPHAGUS, AND STOMACH NUMB.  CONTINUE OMEPRAZOLE.  TAKE 30 MINUTES PRIOR TO YOUR MEALS TWICE DAILY. FOLLOW UP IN 4 MOS.     FOLLOW UP IN 6 MOS.

## 2017-05-15 NOTE — Telephone Encounter (Signed)
Received a fax from [facility] stating that pt has been approved to receive Cosentyx free of charge for the remainder of the calender year.   Will send document to scan center. Patient Aware.  Jaylinn Hellenbrand, [place], CPhT 2:13 PM

## 2017-05-15 NOTE — Assessment & Plan Note (Signed)
SYMPTOMS FAIRLY WELL CONTROLLED.  USE VISCOUS LIDOCAINE 2 TSP Q4-6H PRN FOR CHEST, OR UPPER ABDOMINAL PAIN OR HEARTBURN. USE NO MORE THAN 8 DOSES A DAY. IT WILL MAKE HER MOUTH, ESOPHAGUS, AND STOMACH NUMB. CONTINUE OMEPRAZOLE.  TAKE 30 MINUTES PRIOR TO YOUR MEALS TWICE DAILY. FOLLOW UP IN 4 MOS.

## 2017-05-15 NOTE — Assessment & Plan Note (Signed)
SYMPTOMS CONTROLLED/RESOLVED. Off budesonide for one week.  TAKING CALCIUM WITH VIT D. VIT D LEVeL CHECKED yearly. Please CALL WITH QUESTIONS OR CONCERNS. DRINK WATER TO KEEP YOUR URINE LIGHT YELLOW Follow a high fiber diet. AVOID ITEMS THAT CAUSE BLOATING & GAS.  FOLLOW UP IN 6 MOS.

## 2017-05-16 NOTE — Progress Notes (Signed)
CC'D TO PCP °

## 2017-05-16 NOTE — Progress Notes (Signed)
ON RECALL  °

## 2017-05-17 ENCOUNTER — Telehealth: Payer: Self-pay | Admitting: Rheumatology

## 2017-05-17 NOTE — Telephone Encounter (Signed)
Patient calling to let Lovena Le know she has been experiencing tingling in left wrist/hand  maybe 2 days a week, off and on those days. But does not happen everyday.

## 2017-05-17 NOTE — Telephone Encounter (Signed)
Thank you for informing me.  If symptoms persist or worsen please inform us.

## 2017-05-20 ENCOUNTER — Telehealth: Payer: Self-pay | Admitting: Gastroenterology

## 2017-05-20 NOTE — Telephone Encounter (Signed)
Patient left message inquiring if she should continue taking her probiotic, please let her know

## 2017-05-20 NOTE — Telephone Encounter (Signed)
Lmom, waiting on a return call.  

## 2017-05-21 ENCOUNTER — Ambulatory Visit: Payer: Medicare Other | Admitting: Rheumatology

## 2017-05-21 NOTE — Telephone Encounter (Signed)
Pt said Dr. Oneida Alar did not say whether she should continue the probiotics and she does not see that it is doing any good. She just wants to know if she should continue.

## 2017-05-21 NOTE — Telephone Encounter (Signed)
PLEASE CALL PT. If the probiotics don't help she can stop them.

## 2017-05-21 NOTE — Telephone Encounter (Signed)
LMOM to call.

## 2017-05-22 NOTE — Telephone Encounter (Signed)
Pt is aware.  

## 2017-05-23 ENCOUNTER — Telehealth: Payer: Self-pay | Admitting: Rheumatology

## 2017-05-23 DIAGNOSIS — R202 Paresthesia of skin: Principal | ICD-10-CM

## 2017-05-23 DIAGNOSIS — R200 Anesthesia of skin: Secondary | ICD-10-CM

## 2017-05-23 NOTE — Telephone Encounter (Signed)
Patient called stating that she is having tingling and numbness in her right arm and hand and forgot to say something to [facility] at her appointment on 05/09/17.  Patient states that it doesn't happen every day, but when it does happen it can last 5 minutes or longer.  Patient is worried and requested a return call.

## 2017-05-23 NOTE — Telephone Encounter (Signed)
Per Hazel Sams, PA-C okay to place referral to Dr. Ernestina Patches for EMG. Patient is agreeable. Referral placed.

## 2017-05-29 DIAGNOSIS — Z794 Long term (current) use of insulin: Secondary | ICD-10-CM | POA: Diagnosis not present

## 2017-05-29 DIAGNOSIS — E119 Type 2 diabetes mellitus without complications: Secondary | ICD-10-CM | POA: Diagnosis not present

## 2017-06-03 ENCOUNTER — Other Ambulatory Visit: Payer: Self-pay | Admitting: Rheumatology

## 2017-06-03 NOTE — Telephone Encounter (Signed)
Last Visit: 05/09/17 Next Visit: 08/06/17  Okay to refill per Dr. Estanislado Pandy

## 2017-06-13 ENCOUNTER — Telehealth: Payer: Self-pay | Admitting: Rheumatology

## 2017-06-13 ENCOUNTER — Encounter: Payer: Self-pay | Admitting: *Deleted

## 2017-06-13 MED ORDER — HYDROCODONE-ACETAMINOPHEN 5-325 MG PO TABS: ORAL_TABLET | ORAL | 0 refills | Status: DC

## 2017-06-13 NOTE — Telephone Encounter (Signed)
Patient called requesting prescription refill of Hydrocodone.  Patient's pharmacy is [facility] in [place].

## 2017-06-13 NOTE — Telephone Encounter (Signed)
Last Visit: 05/09/17 Next Visit: 08/06/17 UDS: 05/09/17 Narc Agreement: 12/20/16  Okay to refill Hydrocodone?

## 2017-06-27 ENCOUNTER — Ambulatory Visit (INDEPENDENT_AMBULATORY_CARE_PROVIDER_SITE_OTHER): Payer: PPO | Admitting: Physical Medicine and Rehabilitation

## 2017-06-27 ENCOUNTER — Encounter (INDEPENDENT_AMBULATORY_CARE_PROVIDER_SITE_OTHER): Payer: Self-pay | Admitting: Physical Medicine and Rehabilitation

## 2017-06-27 DIAGNOSIS — R202 Paresthesia of skin: Secondary | ICD-10-CM

## 2017-06-27 NOTE — Progress Notes (Signed)
 .  Numeric Pain Rating Scale and Functional Assessment Average Pain 5   In the last MONTH (on 0-10 scale) has pain interfered with the following?  1. General activity like being  able to carry out your everyday physical activities such as walking, climbing stairs, carrying groceries, or moving a chair?  Rating(3)   +Driver, -BT, -Dye Allergies.  

## 2017-07-01 ENCOUNTER — Other Ambulatory Visit: Payer: Self-pay | Admitting: Rheumatology

## 2017-07-01 NOTE — Procedures (Signed)
EMG & NCV Findings: Evaluation of the right median (across palm) sensory nerve showed prolonged distal peak latency (Wrist, 3.8 ms).  The right ulnar sensory nerve showed prolonged distal peak latency (3.9 ms) and decreased conduction velocity (Wrist-5th Digit, 36 m/s).  All remaining nerves (as indicated in the following tables) were within normal limits.    All examined muscles (as indicated in the following table) showed no evidence of electrical instability.    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  These are mild enough that it could be a temperature artifact.  There is no significant electrodiagnostic evidence of any other focal nerve entrapment, brachial plexopathy or cervical radiculopathy.  As you know, this particular electrodiagnostic study cannot rule out chemical radiculitis or sensory only radiculopathy.  This electrodiagnostic study cannot rule out small fiber polyneuropathy and dysesthesias from central pain sensitization syndromes such as fibromyalgia.  Recommendations: 1.  Follow-up with referring physician. 2.  Continue current management of symptoms. 3.  Continue use of resting splint at night-time and as needed during the day.      Nerve Conduction Studies Anti Sensory Summary Table   Stim Site NR Peak (ms) Norm Peak (ms) P-T Amp (V) Norm P-T Amp Site1 Site2 Delta-P (ms) Dist (cm) Vel (m/s) Norm Vel (m/s)  Right Median Acr Palm Anti Sensory (2nd Digit)  31.5C  Wrist    *3.8 <3.6 20.7 >10 Wrist Palm 1.8 0.0    Palm    2.0 <2.0 13.1         Right Radial Anti Sensory (Base 1st Digit)  31.1C  Wrist    2.4 <3.1 0.9  Wrist Base 1st Digit 2.4 0.0    Right Ulnar Anti Sensory (5th Digit)  31.9C  Wrist    *3.9 <3.7 16.8 >15.0 Wrist 5th Digit 3.9 14.0 *36 >38   Motor Summary Table   Stim Site NR Onset (ms)  Norm Onset (ms) O-P Amp (mV) Norm O-P Amp Site1 Site2 Delta-0 (ms) Dist (cm) Vel (m/s) Norm Vel (m/s)  Right Median Motor (Abd Poll Brev)  30.9C  Wrist    3.4 <4.2 5.5 >5 Elbow Wrist 3.6 18.5 51 >50  Elbow    7.0  5.1         Right Ulnar Motor (Abd Dig Min)  30.6C  Wrist    3.2 <4.2 7.8 >3 B Elbow Wrist 2.7 18.0 67 >53  B Elbow    5.9  8.5  A Elbow B Elbow 1.4 8.5 61 >53  A Elbow    7.3  8.5          EMG   Side Muscle Nerve Root Ins Act Fibs Psw Amp Dur Poly Recrt Int Fraser Din Comment  Right 1stDorInt Ulnar C8-T1 Nml Nml Nml Nml Nml 0 Nml Nml   Right Abd Poll Brev Median C8-T1 Nml Nml Nml Nml Nml 0 Nml Nml   Right ExtDigCom   Nml Nml Nml Nml Nml 0 Nml Nml   Right Triceps Radial C6-7-8 Nml Nml Nml Nml Nml 0 Nml Nml   Right Deltoid Axillary C5-6 Nml Nml Nml Nml Nml 0 Nml Nml     Nerve Conduction Studies Anti Sensory Left/Right Comparison   Stim Site L Lat (ms) R Lat (ms) L-R Lat (ms) L Amp (V) R Amp (V) L-R Amp (%) Site1 Site2 L Vel (m/s) R Vel (m/s) L-R  Vel (m/s)  Median Acr Palm Anti Sensory (2nd Digit)  31.5C  Wrist  *3.8   20.7  Wrist Palm     Palm  2.0   13.1        Radial Anti Sensory (Base 1st Digit)  31.1C  Wrist  2.4   0.9  Wrist Base 1st Digit     Ulnar Anti Sensory (5th Digit)  31.9C  Wrist  *3.9   16.8  Wrist 5th Digit  *36    Motor Left/Right Comparison   Stim Site L Lat (ms) R Lat (ms) L-R Lat (ms) L Amp (mV) R Amp (mV) L-R Amp (%) Site1 Site2 L Vel (m/s) R Vel (m/s) L-R Vel (m/s)  Median Motor (Abd Poll Brev)  30.9C  Wrist  3.4   5.5  Elbow Wrist  51   Elbow  7.0   5.1        Ulnar Motor (Abd Dig Min)  30.6C  Wrist  3.2   7.8  B Elbow Wrist  67   B Elbow  5.9   8.5  A Elbow B Elbow  61   A Elbow  7.3   8.5           Waveforms:

## 2017-07-01 NOTE — Telephone Encounter (Addendum)
Last Visit: 05/09/17 Next Visit: 08/06/17 Labs: 02/19/17 ALT mildly elevated   Left message for patient to advise she is due for labs.   Okay to refill 30 day supply per Dr. Estanislado Pandy

## 2017-07-01 NOTE — Progress Notes (Signed)
ACCALIA RIGDON - 67 y.o. female MRN [id number]  Date of birth: 01-08-1951  Office Visit Note: Visit Date: 06/27/2017 PCP: [name], MD Referred by: [name], MD  Subjective: Chief Complaint  Patient presents with  . Right Arm - Pain, Numbness, Tingling  . Right Hand - Pain, Numbness, Tingling   HPI: Mrs. Orbach is a 67 year old right-hand-dominant female with history of psoriatic arthritis and osteoarthritis.  She comes in at his request of Dr. Estanislado Pandy after having seen Dr. Arlean Hopping assistant Hazel Sams.  Patient is having really aching right arm pain right hand pain with numbness and tingling really somewhat nondermatomal.  She reports that the symptoms started about 6 months ago and she says the tingling and numbness is always present.  She denies any symptoms really on the left.  She reports that there is any on the left it is very occasional and not often.  She has not found anything to make her symptoms better.  She has not had prior electrodiagnostic studies.  She has had known synovitis of the joints in the right hand in particular.  This is been an ongoing issue with medications and management.  The patient is a former [employer] scientist.  She does use her hands.  She does not really endorse specific nocturnal complaints.   ROS Otherwise per HPI.  Assessment & Plan: Visit Diagnoses:  1. Paresthesia of skin     Plan: No additional findings.  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  These are mild enough that it could be a temperature artifact.  There is no significant electrodiagnostic evidence of any other focal nerve entrapment, brachial plexopathy or cervical radiculopathy.  As you know, this particular electrodiagnostic study cannot rule out chemical radiculitis or sensory only radiculopathy.  This electrodiagnostic  study cannot rule out small fiber polyneuropathy and dysesthesias from central pain sensitization syndromes such as fibromyalgia.  Recommendations: 1.  Follow-up with referring physician. 2.  Continue current management of symptoms. 3.  Continue use of resting splint at night-time and as needed during the day.  Meds & Orders: No orders of the defined types were placed in this encounter.   Orders Placed This Encounter  Procedures  . NCV with EMG (electromyography)    Follow-up: Return if symptoms worsen or fail to improve, for Dr. Estanislado Pandy.   Procedures: No procedures performed  EMG & NCV Findings: Evaluation of the right median (across palm) sensory nerve showed prolonged distal peak latency (Wrist, 3.8 ms).  The right ulnar sensory nerve showed prolonged distal peak latency (3.9 ms) and decreased conduction velocity (Wrist-5th Digit, 36 m/s).  All remaining nerves (as indicated in the following tables) were within normal limits.    All examined muscles (as indicated in the following table) showed no evidence of electrical instability.    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  These are mild enough that it could be a temperature artifact.  There is no significant electrodiagnostic evidence of any other focal nerve entrapment, brachial plexopathy or cervical radiculopathy.  As you know, this particular electrodiagnostic study cannot rule out chemical radiculitis or sensory only radiculopathy.  This electrodiagnostic study cannot rule out small fiber polyneuropathy and dysesthesias from central pain sensitization syndromes such  as fibromyalgia.  Recommendations: 1.  Follow-up with referring physician. 2.  Continue current management of symptoms. 3.  Continue use of resting splint at night-time and as needed during the  day.      Nerve Conduction Studies Anti Sensory Summary Table   Stim Site NR Peak (ms) Norm Peak (ms) P-T Amp (V) Norm P-T Amp Site1 Site2 Delta-P (ms) Dist (cm) Vel (m/s) Norm Vel (m/s)  Right Median Acr Palm Anti Sensory (2nd Digit)  31.5C  Wrist    *3.8 <3.6 20.7 >10 Wrist Palm 1.8 0.0    Palm    2.0 <2.0 13.1         Right Radial Anti Sensory (Base 1st Digit)  31.1C  Wrist    2.4 <3.1 0.9  Wrist Base 1st Digit 2.4 0.0    Right Ulnar Anti Sensory (5th Digit)  31.9C  Wrist    *3.9 <3.7 16.8 >15.0 Wrist 5th Digit 3.9 14.0 *36 >38   Motor Summary Table   Stim Site NR Onset (ms) Norm Onset (ms) O-P Amp (mV) Norm O-P Amp Site1 Site2 Delta-0 (ms) Dist (cm) Vel (m/s) Norm Vel (m/s)  Right Median Motor (Abd Poll Brev)  30.9C  Wrist    3.4 <4.2 5.5 >5 Elbow Wrist 3.6 18.5 51 >50  Elbow    7.0  5.1         Right Ulnar Motor (Abd Dig Min)  30.6C  Wrist    3.2 <4.2 7.8 >3 B Elbow Wrist 2.7 18.0 67 >53  B Elbow    5.9  8.5  A Elbow B Elbow 1.4 8.5 61 >53  A Elbow    7.3  8.5          EMG   Side Muscle Nerve Root Ins Act Fibs Psw Amp Dur Poly Recrt Int Fraser Din Comment  Right 1stDorInt Ulnar C8-T1 Nml Nml Nml Nml Nml 0 Nml Nml   Right Abd Poll Brev Median C8-T1 Nml Nml Nml Nml Nml 0 Nml Nml   Right ExtDigCom   Nml Nml Nml Nml Nml 0 Nml Nml   Right Triceps Radial C6-7-8 Nml Nml Nml Nml Nml 0 Nml Nml   Right Deltoid Axillary C5-6 Nml Nml Nml Nml Nml 0 Nml Nml     Nerve Conduction Studies Anti Sensory Left/Right Comparison   Stim Site L Lat (ms) R Lat (ms) L-R Lat (ms) L Amp (V) R Amp (V) L-R Amp (%) Site1 Site2 L Vel (m/s) R Vel (m/s) L-R Vel (m/s)  Median Acr Palm Anti Sensory (2nd Digit)  31.5C  Wrist  *3.8   20.7  Wrist Palm     Palm  2.0   13.1        Radial Anti Sensory (Base 1st Digit)  31.1C  Wrist  2.4   0.9  Wrist Base 1st Digit     Ulnar Anti Sensory (5th Digit)  31.9C  Wrist  *3.9   16.8  Wrist 5th Digit  *36    Motor Left/Right Comparison   Stim Site L Lat  (ms) R Lat (ms) L-R Lat (ms) L Amp (mV) R Amp (mV) L-R Amp (%) Site1 Site2 L Vel (m/s) R Vel (m/s) L-R Vel (m/s)  Median Motor (Abd Poll Brev)  30.9C  Wrist  3.4   5.5  Elbow Wrist  51   Elbow  7.0   5.1        Ulnar Motor (Abd Dig Min)  30.6C  Wrist  3.2   7.8  B Elbow Wrist  67   B Elbow  5.9   8.5  A Elbow B Elbow  61   A Elbow  7.3   8.5           Waveforms:            Clinical History: No specialty comments available.   She reports that she has never smoked. She has never used smokeless tobacco. No results for input(s): HGBA1C, LABURIC in the last 8760 hours.  Objective:  VS:  HT:    WT:   BMI:     BP:   HR: bpm  TEMP: ( )  RESP:  Physical Exam  Musculoskeletal:  Examination of the right hand does show changes of the DIP and PIP joints as well as CMC joints.  There does appear to be some synovitis.  There is pain fullness around each joint.  The patient has some equivocal Phalen's exam.  She has intact sensation to light touch.  She has a negative Tinel's at the wrist and elbow bilaterally.    Ortho Exam Imaging: No results found.  Past Medical/Family/Surgical/Social History: Medications & Allergies reviewed per EMR, new medications updated. Patient Active Problem List   Diagnosis Date Noted  . Transaminitis 06/22/2016  . Collagenous colitis 05/23/2016  . Psoriasis 05/21/2016  . High risk medication use 02/15/2016  . Age-related osteoporosis without current pathological fracture 02/15/2016  . Trochanteric bursitis, left hip 02/15/2016  . Sacroiliitis, not elsewhere classified ([place]) 02/15/2016  . Chronic pain syndrome 02/15/2016  . Abnormal weight gain 06/25/2012  . Hypertension 07/03/2011  . Hypercholesteremia 07/03/2011  . Psoriatic arthritis ([place]) 07/03/2011  . Osteoarthritis of multiple joints 07/03/2011  . Esophageal motility disorder 02/14/2011  . Cough 02/14/2011  . GERD 05/05/2010   Past Medical History:  Diagnosis Date  . Anemia    at times   . Asthma    Albuterol in haler prn  . Cataract    immature unsure which eye  . Chronic back pain   . Degenerative disk disease    psoriatic  . Diabetes mellitus without complication (HCC)    diet controlled  . Esophageal motility disorder    Non-specific, see modified barium study/speech path, BP  . GERD (gastroesophageal reflux disease)    takes Protonix bid  . History of blood transfusion    did have a rash after receiving the blood  . History of blood transfusion    post c-section  . History of bronchitis    last time early 2014  . HTN (hypertension)    takes Metoprolol daily and SPironolactone daily  . Hypercholesterolemia   . Hyperlipidemia    takes Pravastatin daily  . Hypokalemia   . Lymphocytic colitis 05/26/10 TCS RMR   Responded to Entocort x 3 MOS  . Monilia infection 12/93  . Neuropathy   . Osteoporosis    gets Boniva every 3 months  . Peripheral edema    takes HCTZ daily  . PONV (postoperative nausea and vomiting)   . Psoriatic arthritis ([place])    Dr. Katherina Right  . Raynaud's disease   . Seasonal allergies   . Shortness of breath    with exertion  . SUI (stress urinary incontinence, female)   . Urinary urgency    Family History  Problem Relation Age of Onset  . Rheum arthritis Mother   . Diabetes Mother   . Pancreatitis Mother   . Rheumatic fever Father   . Diabetes Unknown  grandparents  . Skin cancer Unknown        grandfather  . Hypertension Unknown        grandparent  . Congestive Heart Failure Unknown        grandfather  . Parkinson's disease Unknown        grandmother  . Colon cancer Maternal Uncle   . Colon polyps Neg Hx    Past Surgical History:  Procedure Laterality Date  . BIOPSY  04/17/2016   Procedure: BIOPSY;  Surgeon: Danie Binder, MD;  Location: [facility];  Service: Endoscopy;;  random colon bx's  . CESAREAN SECTION  1974, 1978      . COLONOSCOPY  06/19/2008   SWN:IOEVO internal hemorrhoids/mild sigmoin colon  diverticulosis  . COLONOSCOPY  2012   Dr. Gala Romney: normal rectum, diverticula, lymphocytic colitis   . COLONOSCOPY WITH PROPOFOL N/A 04/17/2016   Procedure: COLONOSCOPY WITH PROPOFOL;  Surgeon: Danie Binder, MD;  Location: [facility];  Service: Endoscopy;  Laterality: N/A;  900  . DILATION AND CURETTAGE OF UTERUS  1977   spontaneous abortion  . ESOPHAGOGASTRODUODENOSCOPY  06/19/2008   [id number] gastritis  . LUMBAR DISC ARTHROPLASTY  7/98  . LUMBAR FUSION  6/03, 11/08, 11/14   L4-5 fusion, L2-3 fusion, L1-2  . TONSILLECTOMY AND ADENOIDECTOMY    . TUBAL LIGATION     Social History   Occupational History  . Occupation: Case Education officer, environmental: [employer]    Comment: Engineer, technical sales  Tobacco Use  . Smoking status: Never Smoker  . Smokeless tobacco: Never Used  Substance and Sexual Activity  . Alcohol use: No  . Drug use: No  . Sexual activity: Not Currently    Partners: Male    Birth control/protection: Surgical    Comment: BTL

## 2017-07-02 ENCOUNTER — Telehealth: Payer: Self-pay | Admitting: Rheumatology

## 2017-07-02 DIAGNOSIS — E559 Vitamin D deficiency, unspecified: Secondary | ICD-10-CM | POA: Diagnosis not present

## 2017-07-02 DIAGNOSIS — E119 Type 2 diabetes mellitus without complications: Secondary | ICD-10-CM | POA: Diagnosis not present

## 2017-07-02 DIAGNOSIS — I10 Essential (primary) hypertension: Secondary | ICD-10-CM | POA: Diagnosis not present

## 2017-07-02 DIAGNOSIS — M810 Age-related osteoporosis without current pathological fracture: Secondary | ICD-10-CM | POA: Diagnosis not present

## 2017-07-02 NOTE — Telephone Encounter (Signed)
Left message to advise patient she will need to come to office to have her blood work done before her appointment as her blood work is due every 3 months.

## 2017-07-02 NOTE — Telephone Encounter (Signed)
Patient called stating that she is returning your call regarding her lab work.  Patient states she has an appointment scheduled with Lovena Le on 5/7 and plans to get her bloodwork done at that visit.

## 2017-07-03 ENCOUNTER — Telehealth: Payer: Self-pay | Admitting: Rheumatology

## 2017-07-03 DIAGNOSIS — R202 Paresthesia of skin: Principal | ICD-10-CM

## 2017-07-03 DIAGNOSIS — R200 Anesthesia of skin: Secondary | ICD-10-CM

## 2017-07-03 NOTE — Telephone Encounter (Signed)
Patient called stating that she saw Dr. Ernestina Patches on 3/28 who told her that she had mild nerve damage in two spots in her right hand.  Patient states she has numbness and tingling in her right lower arm that goes into her hand.  Patient states that Dr. Ernestina Patches recommended that she get an MRI of her neck.  Patient states that she wants to talk to Dr. Estanislado Pandy before scheduling an appointment.

## 2017-07-03 NOTE — Telephone Encounter (Signed)
I called patient to discuss MRI of the C-spine for evaluation of numbness and tingling in her right arm.  Schedule MRI of her C-spine for right-sided radiculopathy.

## 2017-07-04 NOTE — Addendum Note (Signed)
Addended by: Carole Binning on: 07/04/2017 03:19 PM   Modules accepted: Orders

## 2017-07-04 NOTE — Telephone Encounter (Signed)
Order placed

## 2017-07-08 ENCOUNTER — Other Ambulatory Visit: Payer: Self-pay | Admitting: Rheumatology

## 2017-07-08 ENCOUNTER — Telehealth: Payer: Self-pay | Admitting: Gastroenterology

## 2017-07-08 ENCOUNTER — Other Ambulatory Visit: Payer: Self-pay

## 2017-07-08 NOTE — Progress Notes (Signed)
Office Visit Note  Patient: Tonya Hall             Date of Birth: 1950-10-15           MRN: [id number]             PCP: Tonya Noble, MD Referring: Tonya Noble, MD Visit Date: 07/09/2017 Occupation: @GUAROCC @    Subjective:  Pain and swelling in multiple joints   History of Present Illness: Tonya Hall is a 67 y.o. female with history of psoriatic arthritis, osteoarthritis, and osteoporosis.  Patient states that she has been taking Casodex 150 mg she has done this for 2 doses.  She has not noticed much benefit in switching to this dosing regimen.  She continues to have bilateral hand pain and swelling.  She states that the shooting pains in her hands are less severe than before but are continuing to be frequent.  She is also been having pain in her bilateral feet.  She says states she continues to take Arava 20 mg daily.  She states that she was off of [place] for about 3 weeks due to bronchitis 2 weeks ago.  She denies missing any doses of Percocet takes.  She states she continues to take prednisone 5 mg daily.  She states that she is also having pain in bilateral SI joints.  She has been having symptoms of left-sided sciatica.  She states that she is also been having significant pain in her lower back when standing from a seated position or bending over.  She states this happens about 6-8 times a day and she has difficulty standing up straight for a few minutes.  She states that she has been using a heating pad for pain relief.  She continues to take Norco for pain relief as well.  She continues to have left trochanteric bursitis as well.  She denies any Achilles tendinitis or plantar fasciitis.  She continues to have Raynaud's in her hands and feet.  She denies any color changes or digital ulcerations at this time.  She has no active psoriasis. She continues to have neck pain and neck stiffness.  She has Hall-sided radiculopathy symptoms.  She is going to be scheduled for an MRI of her  neck.    Activities of Daily Living:  Patient reports morning stiffness for   all day .   Patient Denies nocturnal pain.  Difficulty dressing/grooming: Denies Difficulty climbing stairs: Reports Difficulty getting out of chair: Reports Difficulty using hands for taps, buttons, cutlery, and/or writing: Reports   Review of Systems  Constitutional: Positive for fatigue.  HENT: Positive for mouth dryness. Negative for mouth sores and nose dryness.   Eyes: Positive for dryness. Negative for pain and visual disturbance.  Respiratory: Positive for cough (Chronic cough ). Negative for hemoptysis, shortness of breath and difficulty breathing.   Cardiovascular: Negative for chest pain, palpitations, hypertension and swelling in legs/feet.  Gastrointestinal: Positive for diarrhea (Followed by GI). Negative for blood in stool and constipation.  Endocrine: Negative for increased urination.  Genitourinary: Negative for painful urination.  Musculoskeletal: Positive for arthralgias, joint pain, joint swelling and morning stiffness. Negative for myalgias, muscle weakness, muscle tenderness and myalgias.  Skin: Negative for color change, pallor, rash, hair loss, nodules/bumps, skin tightness, ulcers and sensitivity to sunlight.  Allergic/Immunologic: Negative for susceptible to infections.  Neurological: Negative for dizziness, numbness, headaches and weakness.  Hematological: Negative for swollen glands.  Psychiatric/Behavioral: Negative for depressed mood and sleep disturbance. The patient  is not nervous/anxious.     PMFS History:  Patient Active Problem List   Diagnosis Date Noted  . Transaminitis 06/22/2016  . Collagenous colitis 05/23/2016  . Psoriasis 05/21/2016  . High risk medication use 02/15/2016  . Age-related osteoporosis without current pathological fracture 02/15/2016  . Trochanteric bursitis, left hip 02/15/2016  . Sacroiliitis, not elsewhere classified ([place]) 02/15/2016  .  Chronic pain syndrome 02/15/2016  . Abnormal weight gain 06/25/2012  . Hypertension 07/03/2011  . Hypercholesteremia 07/03/2011  . Psoriatic arthritis ([place]) 07/03/2011  . Osteoarthritis of multiple joints 07/03/2011  . Esophageal motility disorder 02/14/2011  . Cough 02/14/2011  . GERD 05/05/2010    Past Medical History:  Diagnosis Date  . Anemia    at times  . Asthma    Albuterol in haler prn  . Cataract    immature unsure which eye  . Chronic back pain   . Degenerative disk disease    psoriatic  . Diabetes mellitus without complication (HCC)    diet controlled  . Esophageal motility disorder    Non-specific, see modified barium study/speech path, BP  . GERD (gastroesophageal reflux disease)    takes Protonix bid  . History of blood transfusion    did have a rash after receiving the blood  . History of blood transfusion    post c-section  . History of bronchitis    last time early 2014  . HTN (hypertension)    takes Metoprolol daily and SPironolactone daily  . Hypercholesterolemia   . Hyperlipidemia    takes Pravastatin daily  . Hypokalemia   . Lymphocytic colitis 05/26/10 TCS RMR   Responded to Entocort x 3 MOS  . Monilia infection 12/93  . Neuropathy   . Osteoporosis    gets Boniva every 3 months  . Peripheral edema    takes HCTZ daily  . PONV (postoperative nausea and vomiting)   . Psoriatic arthritis ([place])    Dr. Katherina Hall  . Raynaud's disease   . Seasonal allergies   . Shortness of breath    with exertion  . SUI (stress urinary incontinence, female)   . Urinary urgency     Family History  Problem Relation Age of Onset  . Diabetes Mother   . Pancreatitis Mother   . Arthritis Mother        psoriatic   . Fibromyalgia Mother   . Rheumatic fever Father   . Diabetes Unknown        grandparents  . Skin cancer Unknown        grandfather  . Hypertension Unknown        grandparent  . Congestive Heart Failure Unknown        grandfather  . Parkinson's  disease Unknown        grandmother  . Colon cancer Maternal Uncle   . Fibromyalgia Sister   . Rheum arthritis Sister   . Diabetes Sister   . Fibromyalgia Sister   . Diabetes Sister   . Hypertension Son   . Colon polyps Neg Hx    Past Surgical History:  Procedure Laterality Date  . BIOPSY  04/17/2016   Procedure: BIOPSY;  Surgeon: Danie Binder, MD;  Location: [facility];  Service: Endoscopy;;  random colon bx's  . CESAREAN SECTION  1974, 1978      . COLONOSCOPY  06/19/2008   QQP:YPPJK internal hemorrhoids/mild sigmoin colon diverticulosis  . COLONOSCOPY  2012   Dr. Gala Romney: normal rectum, diverticula, lymphocytic colitis   .  COLONOSCOPY WITH PROPOFOL N/A 04/17/2016   Procedure: COLONOSCOPY WITH PROPOFOL;  Surgeon: Danie Binder, MD;  Location: [facility];  Service: Endoscopy;  Laterality: N/A;  900  . DILATION AND CURETTAGE OF UTERUS  1977   spontaneous abortion  . ESOPHAGOGASTRODUODENOSCOPY  06/19/2008   DXA:JOINOMVE gastritis  . LUMBAR DISC ARTHROPLASTY  7/98  . LUMBAR FUSION  6/03, 11/08, 11/14   L4-5 fusion, L2-3 fusion, L1-2  . TONSILLECTOMY AND ADENOIDECTOMY    . TUBAL LIGATION     Social History   Social History Narrative  . Not on file     Objective: Vital Signs: BP (!) 157/82 (BP Location: Left Arm, Patient Position: Sitting, Cuff Size: Normal)   Pulse 96   Resp 15   Ht 4\' 11"  (1.499 m)   Wt 137 lb (62.1 kg)   LMP 02/01/1999   BMI 27.67 kg/m    Physical Exam  Constitutional: She is oriented to person, place, and time. She appears well-developed and well-nourished.  HENT:  Head: Normocephalic and atraumatic.  Eyes: Conjunctivae and EOM are normal.  Neck: Normal range of motion.  Cardiovascular: Normal rate, regular rhythm, normal heart sounds and intact distal pulses.  Pulmonary/Chest: Effort normal and breath sounds normal.  Abdominal: Soft. Bowel sounds are normal.  Lymphadenopathy:    She has no cervical adenopathy.  Neurological: She is  alert and oriented to person, place, and time.  Skin: Skin is warm and dry. Capillary refill takes less than 2 seconds.  Nail pitting of all fingernails.   Psychiatric: She has a normal mood and affect. Her behavior is normal.  Nursing note and vitals reviewed.    Musculoskeletal Exam: C-spine limited range of motion with discomfort.  She has some limited range of motion of her thoracic and lumbar spine.  No midline spinal tenderness.  She has bilateral SI joint tenderness.  Shoulder joints, elbow joints, wrist joints, MCPs, PIPs, DIPs good range of motion. She has PIP and DIP synovial thickening.  She has synovitis of Hall 2nd PIP joint. Hip joints, knee joints, ankle joints, MTPs, PIPs, DIPs good range of motion with no synovitis.  No warmth or effusion of bilateral knees.  She has mild knee crepitus.  She has tenderness of left trochanteric bursa.  CDAI Exam: CDAI Homunculus Exam:   Tenderness:  Hall hand: 2nd PIP and 3rd PIP  Swelling:  Hall hand: 2nd PIP  Joint Counts:  CDAI Tender Joint count: 2 CDAI Swollen Joint count: 1     Investigation: No additional findings. CBC Latest Ref Rng & Units 02/19/2017 10/12/2016 08/13/2016  WBC 3.8 - 10.8 Thousand/uL 10.2 9.8 12.6(H)  Hemoglobin 11.7 - 15.5 g/dL 12.6 13.6 13.0  Hematocrit 35.0 - 45.0 % 37.3 41.3 39.4  Platelets 140 - 400 Thousand/uL 249 263 245   CMP Latest Ref Rng & Units 02/19/2017 11/20/2016 11/09/2016  Glucose 65 - 99 mg/dL 117(H) 233(H) 143(H)  BUN 7 - 25 mg/dL 10 21 23   Creatinine 0.50 - 0.99 mg/dL 0.96 1.01(H) 1.00(H)  Sodium 135 - 146 mmol/L 141 133(L) 134(L)  Potassium 3.5 - 5.3 mmol/L 4.3 4.4 4.7  Chloride 98 - 110 mmol/L 109 101 103  CO2 20 - 32 mmol/L 26 19(L) 20  Calcium 8.6 - 10.4 mg/dL 9.4 9.3 9.7  Total Protein 6.1 - 8.1 g/dL 6.4 6.4 -  Total Bilirubin 0.2 - 1.2 mg/dL 0.3 0.4 -  Alkaline Phos 33 - 130 U/L - 49 -  AST 10 - 35 U/L 29  31 -  ALT 6 - 29 U/L 39(H) 48(H) -    Imaging: No results  found.  Speciality Comments: No specialty comments available.    Procedures:  Sacroiliac Joint Inj on 07/09/2017 10:14 AM Indications: pain Details: 27 G 1.5 in needle, posterior approach Medications: 1 mL lidocaine 1 %; 40 mg triamcinolone acetonide 40 MG/ML Aspirate: 0 mL Outcome: tolerated well, no immediate complications Procedure, treatment alternatives, risks and benefits explained, specific risks discussed. Consent was given by the patient. Immediately prior to procedure a time out was called to verify the correct patient, procedure, equipment, support staff and site/side marked as required. Patient was prepped and draped in the usual sterile fashion.     Allergies: Adalimumab; Imuran [azathioprine sodium]; Methotrexate; Plaquenil [hydroxychloroquine sulfate]; Sulfonamide derivatives; Ceftin [cefuroxime axetil]; and Morphine   Assessment / Plan:     Visit Diagnoses: Psoriatic arthritis ([place]): She continues to have pain and swelling in her bilateral hands.  She has synovitis of her Hall second PIP joint and synovial thickening of all PIP and DIP joints.  She is has bilateral SI joint tenderness.  She continues to take Arava 20 mg daily.  She was off of [place] for about 3 weeks due to being diagnosed with bronchitis.  She has been performing Cosentyx injections 150 mg 1 pen every other week.  She has done this for 2 doses.  She has not noticed any benefit since changing the dosing regimen. She continues take prednisone 5 mg daily.  We are going to continue her on the current treatment regimen.  Psoriasis: She has nail pitting in all of her fingernails.  No active psoriasis.  She is no history of psoriasis.  High risk medication use - Cosentyx, Arava 20 mg, Prednisone 5 mg: CBC, CMP, TB gold were ordered today.  She will have labs drawn every 3 months to monitor for drug toxicity.- Plan: CBC with Differential/Platelet, COMPLETE METABOLIC PANEL WITH GFR, QuantiFERON-TB Gold Plus  Chronic  left SI joint pain: She is tenderness of bilateral SI joint worse in her left.  We performed a cortisone injection today in the office.  She tolerated the procedure well.  She was advised to monitor her blood pressure closely following the cortisone injection today.  Primary osteoarthritis involving multiple joints: She has bilateral knee crepitus.  She has PIPs and DIP synovial thickening consistent with ulcer arthritis in her hands and feet.  Age-related osteoporosis without current pathological fracture: Her last Reclast infusion was in December 2018.  She continues to take vitamin D and calcium on a daily basis.  Trochanteric bursitis, left hip: She has tenderness of her left trochanteric bursa.  Raynaud's syndrome without gangrene: No digital ulcerations or signs of gangrene were noted on exam.  She continues to have symptoms of Raynaud's in her hands and feet.  Chronic pain syndrome - Norco for pain relief. UDS 05/09/17. narcotic agreement 12/20/16  Neck pain: She has limited range of motion of her C-spine.  She is having Hall-sided radiculopathy symptoms.  She will be scheduled for an MRI of her C-spine.  Order was placed on 07/04/2017.  Other medical conditions are listed as follows:  Essential hypertension  Esophageal motility disorder  History of gastroesophageal reflux (GERD)  Collagenous colitis  History of diabetes mellitus, type II    Orders: Orders Placed This Encounter  Procedures  . Large Joint Inj  . Sacroiliac Joint Inj  . CBC with Differential/Platelet  . COMPLETE METABOLIC PANEL WITH GFR  . QuantiFERON-TB Gold  Plus   No orders of the defined types were placed in this encounter.   Face-to-face time spent with patient was 30 minutes. >50% of time was spent in counseling and coordination of care.  Follow-Up Instructions: Return for Psoriatic arthritis, Osteoarthritis, Osteoporosis.   Ofilia Neas, PA-C  Note - This record has been created using Dragon  software.  Chart creation errors have been sought, but may not always  have been located. Such creation errors do not reflect on  the standard of medical care.

## 2017-07-08 NOTE — Telephone Encounter (Signed)
Need more info. How many loose stools per day? Any exposure to antibiotics? Looks like last round of entocort was given in October and completed in February?

## 2017-07-08 NOTE — Telephone Encounter (Signed)
Last visit: 05/09/2017 Next visit: 07/09/2017  Okay to refill per Dr. Estanislado Pandy.

## 2017-07-08 NOTE — Telephone Encounter (Signed)
Pt said she finished the Entocort in Feb. She did well for a little while. She has had alternating days with very loose stool, not watery. Sometimes she is OK for several days, then several days with loose stools. No antibiotics.

## 2017-07-08 NOTE — Telephone Encounter (Signed)
Pt called to see if [facility] would call her in Entocort for her colitis. She is going to try [facility] to see if they aren't as expensive.

## 2017-07-08 NOTE — Telephone Encounter (Signed)
Forwarding to refill box.  

## 2017-07-09 ENCOUNTER — Ambulatory Visit: Payer: Medicare Other | Admitting: Physician Assistant

## 2017-07-09 ENCOUNTER — Encounter: Payer: Self-pay | Admitting: Physician Assistant

## 2017-07-09 VITALS — BP 157/82 | HR 96 | Resp 15 | Ht 59.0 in | Wt 137.0 lb

## 2017-07-09 DIAGNOSIS — Z8719 Personal history of other diseases of the digestive system: Secondary | ICD-10-CM | POA: Diagnosis not present

## 2017-07-09 DIAGNOSIS — M7062 Trochanteric bursitis, left hip: Secondary | ICD-10-CM

## 2017-07-09 DIAGNOSIS — Z79899 Other long term (current) drug therapy: Secondary | ICD-10-CM

## 2017-07-09 DIAGNOSIS — G894 Chronic pain syndrome: Secondary | ICD-10-CM | POA: Diagnosis not present

## 2017-07-09 DIAGNOSIS — M159 Polyosteoarthritis, unspecified: Secondary | ICD-10-CM

## 2017-07-09 DIAGNOSIS — K52831 Collagenous colitis: Secondary | ICD-10-CM | POA: Diagnosis not present

## 2017-07-09 DIAGNOSIS — G8929 Other chronic pain: Secondary | ICD-10-CM

## 2017-07-09 DIAGNOSIS — Z8639 Personal history of other endocrine, nutritional and metabolic disease: Secondary | ICD-10-CM

## 2017-07-09 DIAGNOSIS — I7300 Raynaud's syndrome without gangrene: Secondary | ICD-10-CM | POA: Diagnosis not present

## 2017-07-09 DIAGNOSIS — I10 Essential (primary) hypertension: Secondary | ICD-10-CM | POA: Diagnosis not present

## 2017-07-09 DIAGNOSIS — L4050 Arthropathic psoriasis, unspecified: Secondary | ICD-10-CM

## 2017-07-09 DIAGNOSIS — M533 Sacrococcygeal disorders, not elsewhere classified: Secondary | ICD-10-CM | POA: Diagnosis not present

## 2017-07-09 DIAGNOSIS — M150 Primary generalized (osteo)arthritis: Secondary | ICD-10-CM | POA: Diagnosis not present

## 2017-07-09 DIAGNOSIS — K224 Dyskinesia of esophagus: Secondary | ICD-10-CM | POA: Diagnosis not present

## 2017-07-09 DIAGNOSIS — M810 Age-related osteoporosis without current pathological fracture: Secondary | ICD-10-CM

## 2017-07-09 DIAGNOSIS — M8949 Other hypertrophic osteoarthropathy, multiple sites: Secondary | ICD-10-CM

## 2017-07-09 DIAGNOSIS — L409 Psoriasis, unspecified: Secondary | ICD-10-CM

## 2017-07-09 MED ORDER — TRIAMCINOLONE ACETONIDE 40 MG/ML IJ SUSP
40.0000 mg | INTRAMUSCULAR | Status: AC | PRN
  Administered 2017-07-09: 40 mg via INTRA_ARTICULAR

## 2017-07-09 MED ORDER — LIDOCAINE HCL 1 % IJ SOLN
1.0000 mL | INTRAMUSCULAR | Status: AC | PRN
  Administered 2017-07-09: 1 mL

## 2017-07-09 NOTE — Telephone Encounter (Signed)
Likely having flare with known history of collagenous colitis. Low concern for infectious process. With her other health issues including age-related osteoporosis, I would like to avoid another round of entocort unless clinically worsening or does not symptomatically improve.   Unless she starts having  ?3 stools daily or ?1 watery stool daily, let's try imodium prn. Could also consider Bentyl. Call with update if persists or worsens. Also, avoid any NSAIDs if at all possible. PPIs have also been associated with increased risk of microscopic colitis. She is on BID therapy with history of GERD. If she is able to wean down to once a day dosing, that would be great.

## 2017-07-09 NOTE — Telephone Encounter (Signed)
I called to inform pt. She said she is having at least 3 stools daily now. They start out loose and by the end they are watery. She is taking the Bentyl bid and will start the Imodium. She does not take NSAIDS and most of the time just takes PPI once a day, because she forgets to take it the second time. She will see if Imodium helps and let us know.

## 2017-07-10 ENCOUNTER — Telehealth: Payer: Self-pay | Admitting: Rheumatology

## 2017-07-10 NOTE — Telephone Encounter (Signed)
Patient request refill on Consentyx to [facility]. Patient due this coming week.

## 2017-07-10 NOTE — Telephone Encounter (Signed)
Last Visit: 07/09/17 Next visit: 08/06/17 Labs: 07/09/17 WBC 13.6 ALT stable TB Gold: 06/21/16 Neg   Okay to refill per Dr. Estanislado Pandy

## 2017-07-11 LAB — COMPLETE METABOLIC PANEL WITH GFR
AG Ratio: 2.3 (calc) (ref 1.0–2.5)
ALT: 45 U/L — ABNORMAL HIGH (ref 6–29)
AST: 32 U/L (ref 10–35)
Albumin: 4.4 g/dL (ref 3.6–5.1)
Alkaline phosphatase (APISO): 65 U/L (ref 33–130)
BUN: 21 mg/dL (ref 7–25)
CO2: 25 mmol/L (ref 20–32)
Calcium: 9.8 mg/dL (ref 8.6–10.4)
Chloride: 106 mmol/L (ref 98–110)
Creat: 0.93 mg/dL (ref 0.50–0.99)
GFR, Est African American: 74 mL/min/{1.73_m2} (ref >=60)
GFR, Est Non African American: 64 mL/min/{1.73_m2} (ref >=60)
Globulin: 1.9 g/dL (calc) (ref 1.9–3.7)
Glucose, Bld: 111 mg/dL — ABNORMAL HIGH (ref 65–99)
Potassium: 3.8 mmol/L (ref 3.5–5.3)
Sodium: 139 mmol/L (ref 135–146)
Total Bilirubin: 0.3 mg/dL (ref 0.2–1.2)
Total Protein: 6.3 g/dL (ref 6.1–8.1)

## 2017-07-11 LAB — QUANTIFERON-TB GOLD PLUS
Mitogen-NIL: >10 IU/mL
NIL: 0.08 IU/mL
QuantiFERON-TB Gold Plus: NEGATIVE
TB1-NIL: <0 IU/mL
TB2-NIL: 0 IU/mL

## 2017-07-11 LAB — CBC WITH DIFFERENTIAL/PLATELET
Basophils Absolute: 136 cells/uL (ref 0–200)
Basophils Relative: 1 %
Eosinophils Absolute: 966 cells/uL — ABNORMAL HIGH (ref 15–500)
Eosinophils Relative: 7.1 %
HCT: 38.6 % (ref 35.0–45.0)
Hemoglobin: 13 g/dL (ref 11.7–15.5)
Lymphs Abs: 2162 cells/uL (ref 850–3900)
MCH: 32.2 pg (ref 27.0–33.0)
MCHC: 33.7 g/dL (ref 32.0–36.0)
MCV: 95.5 fL (ref 80.0–100.0)
MPV: 11.8 fL (ref 7.5–12.5)
Monocytes Relative: 8 %
Neutro Abs: 9248 cells/uL — ABNORMAL HIGH (ref 1500–7800)
Neutrophils Relative %: 68 %
Platelets: 245 10*3/uL (ref 140–400)
RBC: 4.04 10*6/uL (ref 3.80–5.10)
RDW: 12.4 % (ref 11.0–15.0)
Total Lymphocyte: 15.9 %
WBC mixed population: 1088 cells/uL — ABNORMAL HIGH (ref 200–950)
WBC: 13.6 10*3/uL — ABNORMAL HIGH (ref 3.8–10.8)

## 2017-07-11 NOTE — Progress Notes (Signed)
Labs are stable.  We will continue to monitor.

## 2017-07-15 ENCOUNTER — Other Ambulatory Visit: Payer: Self-pay

## 2017-07-15 ENCOUNTER — Ambulatory Visit (HOSPITAL_COMMUNITY)
Admission: RE | Admit: 2017-07-15 | Discharge: 2017-07-15 | Disposition: A | Discharge status: Home / Self Care | Payer: PPO | Source: Ambulatory Visit | Attending: Rheumatology | Admitting: Rheumatology

## 2017-07-15 DIAGNOSIS — M5021 Other cervical disc displacement,  high cervical region: Secondary | ICD-10-CM | POA: Insufficient documentation

## 2017-07-15 DIAGNOSIS — R197 Diarrhea, unspecified: Secondary | ICD-10-CM

## 2017-07-15 DIAGNOSIS — M47812 Spondylosis without myelopathy or radiculopathy, cervical region: Secondary | ICD-10-CM | POA: Diagnosis not present

## 2017-07-15 DIAGNOSIS — M4802 Spinal stenosis, cervical region: Secondary | ICD-10-CM | POA: Insufficient documentation

## 2017-07-15 DIAGNOSIS — M542 Cervicalgia: Secondary | ICD-10-CM | POA: Diagnosis not present

## 2017-07-15 DIAGNOSIS — R200 Anesthesia of skin: Secondary | ICD-10-CM | POA: Insufficient documentation

## 2017-07-15 DIAGNOSIS — R202 Paresthesia of skin: Secondary | ICD-10-CM | POA: Insufficient documentation

## 2017-07-15 NOTE — Telephone Encounter (Signed)
Check Cdiff to have on file. If negative, will send in entocort.

## 2017-07-15 NOTE — Telephone Encounter (Signed)
PT said she has been having soft stools for about 11 days. The last 2 days it has all been watery, 3-4 times a day.  She did not have imodium yesterday when she first has the watery stool. So today she took imodium x 1. It just slows down for a little while. She has constant dull pain over entire abdomen, but she has intermittent RUQ pain which is worse after she has a BM.  She is wanting to go back on the Entocort. She is aware that Dr. Oneida Alar is still off and will return tomorrow to the hospital. Vicente Males, any recommendations?

## 2017-07-15 NOTE — Telephone Encounter (Signed)
PT called and left Vm that her diarrhea is worse and I called to get more info. LMOM for a return call.

## 2017-07-16 ENCOUNTER — Other Ambulatory Visit: Payer: Self-pay

## 2017-07-16 ENCOUNTER — Telehealth: Payer: Self-pay | Admitting: Rheumatology

## 2017-07-16 ENCOUNTER — Telehealth: Payer: Self-pay | Admitting: Physician Assistant

## 2017-07-16 DIAGNOSIS — R197 Diarrhea, unspecified: Secondary | ICD-10-CM

## 2017-07-16 NOTE — Telephone Encounter (Signed)
-----   Message from Bo Merino, MD sent at 07/16/2017  8:36 AM EDT ----- Please discuss results with Pt. She should see NES.

## 2017-07-16 NOTE — Telephone Encounter (Signed)
I attempted to call patient to discuss MRI results. I left a voicemail for her to call back at the office.

## 2017-07-16 NOTE — Telephone Encounter (Signed)
Patient left a voicemail stating that she is due to take her Cosentyx injection today, but according to the specialty pharmacy they still don't have a prescription from our office.  Patient is requesting that you fax another prescription to them so she can take her medication.

## 2017-07-16 NOTE — Telephone Encounter (Signed)
Patient advised her prescription had been faxed to the specialty pharmacy at the end of last week. Patient will come to the office to get a sample of Cosentyx.

## 2017-07-16 NOTE — Telephone Encounter (Signed)
Patient called stating she was returning a call to our office regarding her MRI results.

## 2017-07-16 NOTE — Progress Notes (Signed)
Please discuss results with Pt. She should see NES.

## 2017-07-16 NOTE — Telephone Encounter (Signed)
Pt is aware and will go pick up containers and do these tests.

## 2017-07-16 NOTE — Progress Notes (Unsigned)
Tonya Hall: labs entered. Just need to release I believe

## 2017-07-16 NOTE — Telephone Encounter (Signed)
I spoke with patient about her C-spine MRI results.  I addressed all of her questions.  I advised her to follow up with her neurosurgeon for further evaluation and management.

## 2017-07-17 ENCOUNTER — Telehealth: Payer: Self-pay

## 2017-07-17 NOTE — Telephone Encounter (Signed)
Medication Samples have been provided to the patient.  Drug name: Cosentyx      Strength: 150mg /mL       Qty: 1 pen  LOT: [id number]  Exp.Date: 08/2018  Dosing instructions: Inject 150mg  every 2 weeks.   The patient has been instructed regarding the correct time, dose, and frequency of taking this medication, including desired effects and most common side effects.   Amyiah Gaba C Elaya Droege 12:10 PM 07/17/2017

## 2017-07-18 ENCOUNTER — Telehealth: Payer: Self-pay | Admitting: Rheumatology

## 2017-07-18 MED ORDER — HYDROCODONE-ACETAMINOPHEN 5-325 MG PO TABS: ORAL_TABLET | ORAL | 0 refills | Status: DC

## 2017-07-18 NOTE — Telephone Encounter (Signed)
Patient called requesting prescription refill of Hydrocodone.  Patient's pharmacy is [facility] in [place].

## 2017-07-18 NOTE — Telephone Encounter (Signed)
Prescription has been re faxed.

## 2017-07-18 NOTE — Telephone Encounter (Signed)
Patient states mail order pharmacy got rx for Cosentyx, but signature was cut off when received fax. Please refax, or call with a verbal.

## 2017-07-18 NOTE — Telephone Encounter (Signed)
Last Visit: 07/09/17 Next visit: 08/06/17 UDS: 05/09/17 Narc Agreement: 12/20/16  Okay to refill hydrocodone?

## 2017-07-23 NOTE — Progress Notes (Signed)
Office Visit Note  Patient: Tonya Hall             Date of Birth: 01-Feb-1951           MRN: [id number]             PCP: [name], MD Referring: [name], MD Visit Date: 08/06/2017 Occupation: @GUAROCC @    Subjective:  Left trochanteric bursitis   History of Present Illness: Tonya Hall is a 67 y.o. female with history of psoriatic arthritis, osteoarthritis, osteoporosis.  She injects Cosentyx 150 mg subcutaneously every 14 days and takes Arava 20 mg daily and prednisone 5 mg daily.  She reports that she has noticed improvement since splitting of her doses of Cosentyx.  The pain in her bilateral feet and bilateral hands have improved.  Her swelling is also improved.  She can almost make a complete fist bilaterally.  She continues to have some discomfort in her left SI joint but it feels better after her cortisone injection at her last visit.  She is having left trochanteric bursitis at this time with radiation of pain down the left leg.  She denies any plantar fascitis or Achilles tendinitis.  She denies any psoriasis. She has an appointment Dr. Arnoldo Morale today for her neck pain and radiculopathy.    Activities of Daily Living:  Patient reports morning stiffness for 1-2 hours.   Patient Denies nocturnal pain.  Difficulty dressing/grooming: Denies Difficulty climbing stairs: Reports Difficulty getting out of chair: Reports Difficulty using hands for taps, buttons, cutlery, and/or writing: Reports   Review of Systems  Constitutional: Positive for fatigue.  HENT: Positive for mouth dryness. Negative for mouth sores and nose dryness.   Eyes: Positive for dryness. Negative for pain and visual disturbance.  Respiratory: Positive for cough. Negative for hemoptysis, shortness of breath and difficulty breathing.   Cardiovascular: Negative for chest pain, palpitations, hypertension and swelling in legs/feet.  Gastrointestinal: Negative for blood in stool, constipation and  diarrhea.  Endocrine: Negative for increased urination.  Genitourinary: Negative for painful urination.  Musculoskeletal: Positive for arthralgias, joint pain, joint swelling and morning stiffness. Negative for myalgias, muscle weakness, muscle tenderness and myalgias.  Skin: Negative for color change, pallor, rash, hair loss, nodules/bumps, skin tightness, ulcers and sensitivity to sunlight.  Allergic/Immunologic: Negative for susceptible to infections.  Neurological: Negative for dizziness, numbness, headaches and weakness.  Hematological: Negative for swollen glands.  Psychiatric/Behavioral: Negative for depressed mood and sleep disturbance. The patient is not nervous/anxious.     PMFS History:  Patient Active Problem List   Diagnosis Date Noted  . Transaminitis 06/22/2016  . Collagenous colitis 05/23/2016  . Psoriasis 05/21/2016  . High risk medication use 02/15/2016  . Age-related osteoporosis without current pathological fracture 02/15/2016  . Trochanteric bursitis, left hip 02/15/2016  . Sacroiliitis, not elsewhere classified ([place]) 02/15/2016  . Chronic pain syndrome 02/15/2016  . Abnormal weight gain 06/25/2012  . Hypertension 07/03/2011  . Hypercholesteremia 07/03/2011  . Psoriatic arthritis ([place]) 07/03/2011  . Osteoarthritis of multiple joints 07/03/2011  . Esophageal motility disorder 02/14/2011  . Cough 02/14/2011  . GERD 05/05/2010    Past Medical History:  Diagnosis Date  . Anemia    at times  . Asthma    Albuterol in haler prn  . Cataract    immature unsure which eye  . Chronic back pain   . Degenerative disk disease    psoriatic  . Diabetes mellitus without complication (HCC)    diet controlled  .  Esophageal motility disorder    Non-specific, see modified barium study/speech path, BP  . GERD (gastroesophageal reflux disease)    takes Protonix bid  . History of blood transfusion    did have a rash after receiving the blood  . History of blood  transfusion    post c-section  . History of bronchitis    last time early 2014  . HTN (hypertension)    takes Metoprolol daily and SPironolactone daily  . Hypercholesterolemia   . Hyperlipidemia    takes Pravastatin daily  . Hypokalemia   . Lymphocytic colitis 05/26/10 TCS RMR   Responded to Entocort x 3 MOS  . Monilia infection 12/93  . Neuropathy   . Osteoporosis    gets Boniva every 3 months  . Peripheral edema    takes HCTZ daily  . PONV (postoperative nausea and vomiting)   . Psoriatic arthritis ([place])    Dr. Katherina Right  . Raynaud's disease   . Seasonal allergies   . Shortness of breath    with exertion  . SUI (stress urinary incontinence, female)   . Urinary urgency     Family History  Problem Relation Age of Onset  . Diabetes Mother   . Pancreatitis Mother   . Arthritis Mother        psoriatic   . Fibromyalgia Mother   . Rheumatic fever Father   . Diabetes Unknown        grandparents  . Skin cancer Unknown        grandfather  . Hypertension Unknown        grandparent  . Congestive Heart Failure Unknown        grandfather  . Parkinson's disease Unknown        grandmother  . Colon cancer Maternal Uncle   . Fibromyalgia Sister   . Rheum arthritis Sister   . Diabetes Sister   . Fibromyalgia Sister   . Diabetes Sister   . Hypertension Son   . Colon polyps Neg Hx    Past Surgical History:  Procedure Laterality Date  . BIOPSY  04/17/2016   Procedure: BIOPSY;  Surgeon: Danie Binder, MD;  Location: [facility];  Service: Endoscopy;;  random colon bx's  . CESAREAN SECTION  1974, 1978      . COLONOSCOPY  06/19/2008   PXT:GGYIR internal hemorrhoids/mild sigmoin colon diverticulosis  . COLONOSCOPY  2012   Dr. Gala Romney: normal rectum, diverticula, lymphocytic colitis   . COLONOSCOPY WITH PROPOFOL N/A 04/17/2016   Procedure: COLONOSCOPY WITH PROPOFOL;  Surgeon: Danie Binder, MD;  Location: [facility];  Service: Endoscopy;  Laterality: N/A;  900  . DILATION  AND CURETTAGE OF UTERUS  1977   spontaneous abortion  . ESOPHAGOGASTRODUODENOSCOPY  06/19/2008   SWN:IOEVOJJK gastritis  . LUMBAR DISC ARTHROPLASTY  7/98  . LUMBAR FUSION  6/03, 11/08, 11/14   L4-5 fusion, L2-3 fusion, L1-2  . TONSILLECTOMY AND ADENOIDECTOMY    . TUBAL LIGATION     Social History   Social History Narrative  . Not on file     Objective: Vital Signs: BP (!) 146/88 (BP Location: Left Arm, Patient Position: Sitting, Cuff Size: Normal)   Pulse 89   Resp 13   Ht 4\' 11"  (1.499 m)   Wt 136 lb (61.7 kg)   LMP 02/01/1999   BMI 27.47 kg/m    Physical Exam  Constitutional: She is oriented to person, place, and time. She appears well-developed and well-nourished.  HENT:  Head:  Normocephalic and atraumatic.  Eyes: Conjunctivae and EOM are normal.  Neck: Normal range of motion.  Cardiovascular: Normal rate, regular rhythm, normal heart sounds and intact distal pulses.  Pulmonary/Chest: Effort normal and breath sounds normal.  Abdominal: Soft. Bowel sounds are normal.  Lymphadenopathy:    She has no cervical adenopathy.  Neurological: She is alert and oriented to person, place, and time.  Skin: Skin is warm and dry. Capillary refill takes less than 2 seconds.  Psychiatric: She has a normal mood and affect. Her behavior is normal.  Nursing note and vitals reviewed.    Musculoskeletal Exam: C-spine limited ROM with discomfort.  Thoracic and lumbar spine good range of motion.  She has no midline spinal tenderness and thoracic and lumbar spine.  She has left SI joint tenderness.  Shoulder joints, elbow joints, wrist joints, MCPs, PIPs, DIPs good range of motion with no synovitis.  She has almost complete fist formation bilaterally.  She has PIP and DIP synovial thickening.  Synovitis of her right third PIP and left second PIP joint.  Hip joints, knee joints, ankle joints, MTPs, PIPs, DIPs good range of motion no synovitis.  No warmth or effusion of bilateral knees.  PIP and  DIP synovial thickening in bilateral feet.  Tenderness of the left trochanteric bursa.  CDAI Exam: CDAI Homunculus Exam:   Tenderness:  Right hand: 3rd PIP Left hand: 2nd PIP  Joint Counts:  CDAI Tender Joint count: 2 CDAI Swollen Joint count: 0  Global Assessments:  Patient Global Assessment: 6 Provider Global Assessment: 6  CDAI Calculated Score: 14    Investigation: No additional findings. CBC Latest Ref Rng & Units 07/09/2017 02/19/2017 10/12/2016  WBC 3.8 - 10.8 Thousand/uL 13.6(H) 10.2 9.8  Hemoglobin 11.7 - 15.5 g/dL 13.0 12.6 13.6  Hematocrit 35.0 - 45.0 % 38.6 37.3 41.3  Platelets 140 - 400 Thousand/uL 245 249 263   CMP Latest Ref Rng & Units 07/09/2017 02/19/2017 11/20/2016  Glucose 65 - 99 mg/dL 111(H) 117(H) 233(H)  BUN 7 - 25 mg/dL 21 10 21   Creatinine 0.50 - 0.99 mg/dL 0.93 0.96 1.01(H)  Sodium 135 - 146 mmol/L 139 141 133(L)  Potassium 3.5 - 5.3 mmol/L 3.8 4.3 4.4  Chloride 98 - 110 mmol/L 106 109 101  CO2 20 - 32 mmol/L 25 26 19(L)  Calcium 8.6 - 10.4 mg/dL 9.8 9.4 9.3  Total Protein 6.1 - 8.1 g/dL 6.3 6.4 6.4  Total Bilirubin 0.2 - 1.2 mg/dL 0.3 0.3 0.4  Alkaline Phos 33 - 130 U/L - - 49  AST 10 - 35 U/L 32 29 31  ALT 6 - 29 U/L 45(H) 39(H) 48(H)     Imaging: Mr Cervical Spine Wo Contrast  Result Date: 07/15/2017 CLINICAL DATA:  Numbness and tingling of the right arm and hand intermittently over the last 6 months. EXAM: MRI CERVICAL SPINE WITHOUT CONTRAST TECHNIQUE: Multiplanar, multisequence MR imaging of the cervical spine was performed. No intravenous contrast was administered. COMPARISON:  None. FINDINGS: Alignment: 2 mm anterolisthesis C3-4. 5 mm anterolisthesis C4-5. 2 mm retrolisthesis C6-7. 2 mm anterolisthesis C7-T1. Vertebrae: No primary bone lesion. Cord: No primary cord lesion.  See below regarding stenosis. Posterior Fossa, vertebral arteries, paraspinal tissues: Negative Disc levels: Foramen magnum is widely patent. There is osteoarthritis at  C1-2, worse on the left. No encroachment upon the neural structures. C2-3: Facet arthropathy on the right. Mild bulging of the disc. Mild right foraminal stenosis. No canal stenosis. C3-4: Bilateral facet arthropathy with 2 mm  of anterolisthesis. Broad-based herniation of the disc more prominent towards the left. AP diameter of the canal as narrow as 5 mm. Flattening and deformity of the cord. Bilateral foraminal narrowing left worse than right. C4-5: Bilateral facet arthropathy with anterolisthesis of 5 mm. Facet degeneration worse on the left. Central disc herniation. Indentation of the ventral cord. AP diameter of the canal as narrow as 7 mm. Foraminal stenosis on the left that could cause neural compression. C5-6: Facet arthropathy on the left. Mild bulging of the disc. No compressive stenosis. C6-7: 2 mm retrolisthesis. Endplate osteophytes and bulging of the disc. Narrowing of the ventral subarachnoid space but no compression of the cord. Bilateral foraminal narrowing. C7-T1: Facet arthropathy worse on the right with 2 mm of anterolisthesis. No central canal stenosis. Foraminal stenosis right worse than left. IMPRESSION: C3-4: Advanced bilateral facet arthropathy. 2 mm of anterolisthesis. Broad-based disc herniation more prominent towards the left. Spinal stenosis with AP diameter as narrow as 5 mm. Effacement of the subarachnoid space and deformity of the cord. Findings at this level could certainly lead to myelopathy and decompression should be considered. Bilateral foraminal stenosis could cause neural compression. C4-5: Advanced bilateral facet arthropathy with 5 mm of anterolisthesis. Central disc herniation. Disc material indents the cord centrally, where AP diameter of the canal measures only 7 mm. Bilateral foraminal stenosis could cause neural compression. C1-2: Arthropathy worse on the left could contribute to neck pain. C2-3: Right-sided predominant spondylosis and facet arthropathy with right  foraminal narrowing. C5-6: Left-sided facet arthropathy with left foraminal narrowing. C6-7: Bilateral foraminal narrowing, not definitely compressive. C7-T1: Facet arthropathy worse on the right. Foraminal narrowing on the right could affect in particular the right C8 nerve. Electronically Signed   By: Nelson Chimes M.D.   On: 07/15/2017 09:20    Speciality Comments: No specialty comments available.    Procedures:  Large Joint Inj: L greater trochanter on 08/06/2017 11:15 AM Indications: pain Details: 27 G 1.5 in needle, lateral approach  Arthrogram: No  Medications: 1 mL lidocaine 1 %; 40 mg triamcinolone acetonide 40 MG/ML Aspirate: 0 mL Outcome: tolerated well, no immediate complications Procedure, treatment alternatives, risks and benefits explained, specific risks discussed. Consent was given by the patient. Immediately prior to procedure a time out was called to verify the correct patient, procedure, equipment, support staff and site/side marked as required. Patient was prepped and draped in the usual sterile fashion.     Allergies: Adalimumab; Imuran [azathioprine sodium]; Methotrexate; Plaquenil [hydroxychloroquine sulfate]; Sulfonamide derivatives; Ceftin [cefuroxime axetil]; and Morphine   Assessment / Plan:     Visit Diagnoses: Psoriatic arthritis ([place]): She continues to have active synovitis of her right third PIP and left second PIP joint.  She is almost able to make a complete fist bilaterally. Her joint pain joint swelling has improved significantly since splitting her doses of Cosentyx to 150 mg subcutaneous every 2 weeks.  She continues to take Arava 20 mg daily and prednisone 5 mg daily.  She will continue on his current treatment regimen.  Psoriasis: No active psoriasis.   High risk medication use - Cosentyx 150 mg subcutaneous inj every 14 days, Arava 20 mg, Prednisone 5 mg TB gold: 4/9/19CBC and CMP: 07/09/17.  She will be due for labs in July and every 3 months to  monitor for drug toxicity.  Chronic left SI joint pain: She has left SI joint tenderness.  She had a cortisone injection of the left SI joint at her last visit, which improved  her symptoms.    Primary osteoarthritis involving multiple joints:  Age-related osteoporosis without current pathological fracture - Her last Reclast infusion was in December 2018.  She continues to take vitamin D and calcium on a daily basis.  Trochanteric bursitis, left hip: She has tenderness of the left trochanteric bursa.  She requested a cortisone injection today in the office.  She tolerated the procedure well.  She was advised to monitor her blood pressure closely following the cortisone injection today.  Raynaud's syndrome without gangrene: She continues to have mild symptoms of Raynaud's.  She has no gangrene or digital ulcerations.   Chronic pain syndrome - Norco for pain relief. UDS 05/09/17. narcotic agreement 12/20/16  Neck pain - MRI: She has an appointment with Dr. Arnoldo Morale today for evaluation of her neck pain and radiculopathy.  Other medical conditions are listed as follows:  Essential hypertension  Esophageal motility disorder  History of gastroesophageal reflux (GERD)  Collagenous colitis  History of type 2 diabetes mellitus    Orders: Orders Placed This Encounter  Procedures  . Large Joint Inj   Meds ordered this encounter  Medications  . leflunomide (ARAVA) 20 MG tablet    Sig: Take 1 tablet (20 mg total) by mouth daily.    Dispense:  90 tablet    Refill:  0    Face-to-face time spent with patient was 30 minutes.> 50% of time was spent in counseling and coordination of care.  Follow-Up Instructions: Return for Psoriatic arthritis, Osteoarthritis, Osteoporosis.   Ofilia Neas, PA-C I examined and evaluated the patient with Hazel Sams PA.  Patient is clinically doing much better now she has still some ongoing synovitis.  The plan of care was discussed as noted above.  Bo Merino, MD Note - This record has been created using Editor, commissioning.  Chart creation errors have been sought, but may not always  have been located. Such creation errors do not reflect on  the standard of medical care.

## 2017-07-25 ENCOUNTER — Encounter: Payer: Self-pay | Admitting: Gastroenterology

## 2017-07-25 DIAGNOSIS — R197 Diarrhea, unspecified: Secondary | ICD-10-CM | POA: Diagnosis not present

## 2017-07-26 LAB — C. DIFFICILE GDH AND TOXIN A/B
GDH ANTIGEN: NOT DETECTED
MICRO NUMBER:: 90509853
SPECIMEN QUALITY:: ADEQUATE
TOXIN A AND B: NOT DETECTED

## 2017-07-26 LAB — CLOSTRIDIUM DIFFICILE TOXIN B, QUALITATIVE, REAL-TIME PCR: Toxigenic C. Difficile by PCR: NOT DETECTED

## 2017-08-01 ENCOUNTER — Telehealth: Payer: Self-pay | Admitting: Gastroenterology

## 2017-08-01 NOTE — Telephone Encounter (Signed)
[phone] PLEASE CALL PATIENT IF HER STOOL SAMPLE RESULTS ARE IN

## 2017-08-01 NOTE — Telephone Encounter (Signed)
Pt is aware Tonya Hall has not signed off, but stools were negative. She said she is better, not having diarrhea, but just nor normal. She will go several days with 2-3 movements of more sludge like stool. Then one day she had several episodes of diarrhea and she took Imodium. The next couple of days she did not have any BM. But she would have abdominal pain like she needed to have BM. Then back to the sludge.  Please advise!

## 2017-08-02 ENCOUNTER — Other Ambulatory Visit: Payer: Self-pay | Admitting: Rheumatology

## 2017-08-02 MED ORDER — BUDESONIDE 3 MG PO CPEP: 9.0000 mg | ORAL_CAPSULE | Freq: Every day | ORAL | 1 refills | Status: DC

## 2017-08-02 NOTE — Telephone Encounter (Signed)
Last Visit: 07/09/17 Next visit: 08/06/17  Okay to refill per Dr. Estanislado Pandy

## 2017-08-02 NOTE — Telephone Encounter (Signed)
I sent in Entocort: take 3 capsules daily for 4 weeks, then will need 2 daily for 4 weeks, then 1 daily for 4 weeks. I sent in a refill X 1 to [place], but she may [facility] to [facility]. Just let us know.   She needs an appt here in next 4-6 weeks for follow-up, microscopic colitis.

## 2017-08-02 NOTE — Addendum Note (Signed)
Addended by: Annitta Needs on: 08/02/2017 08:59 AM   Modules accepted: Orders

## 2017-08-02 NOTE — Telephone Encounter (Signed)
Left a detailed message for pt. Waiting on pt to call back to scheduled follow-up.

## 2017-08-05 ENCOUNTER — Other Ambulatory Visit: Payer: Self-pay | Admitting: Obstetrics & Gynecology

## 2017-08-05 ENCOUNTER — Other Ambulatory Visit (HOSPITAL_COMMUNITY): Payer: Self-pay | Admitting: Family Medicine

## 2017-08-05 DIAGNOSIS — Z1231 Encounter for screening mammogram for malignant neoplasm of breast: Secondary | ICD-10-CM

## 2017-08-05 NOTE — Telephone Encounter (Signed)
PT is aware Entocort was sent to [facility] and that is fine. She no longer uses [facility]. Erline Levine, please see note. Pt needs OV within 4-6 weeks.

## 2017-08-05 NOTE — Telephone Encounter (Signed)
Noted  

## 2017-08-05 NOTE — Telephone Encounter (Signed)
PT is aware and will let us know.

## 2017-08-05 NOTE — Telephone Encounter (Signed)
PATIENT SCHEDULED 10/2017 WITH [facility]

## 2017-08-05 NOTE — Telephone Encounter (Signed)
Please make sure she is aware about the taper. Should have enough with the refills that have been provided. Will keep August appt unless she does not have improvement, then I would definitely recommend coming in sooner.

## 2017-08-06 ENCOUNTER — Encounter: Payer: Self-pay | Admitting: Physician Assistant

## 2017-08-06 ENCOUNTER — Ambulatory Visit: Payer: PPO | Admitting: Physician Assistant

## 2017-08-06 VITALS — BP 146/88 | HR 89 | Resp 13 | Ht 59.0 in | Wt 136.0 lb

## 2017-08-06 DIAGNOSIS — M810 Age-related osteoporosis without current pathological fracture: Secondary | ICD-10-CM

## 2017-08-06 DIAGNOSIS — M7062 Trochanteric bursitis, left hip: Secondary | ICD-10-CM | POA: Diagnosis not present

## 2017-08-06 DIAGNOSIS — Z8719 Personal history of other diseases of the digestive system: Secondary | ICD-10-CM | POA: Diagnosis not present

## 2017-08-06 DIAGNOSIS — I7300 Raynaud's syndrome without gangrene: Secondary | ICD-10-CM

## 2017-08-06 DIAGNOSIS — M159 Polyosteoarthritis, unspecified: Secondary | ICD-10-CM

## 2017-08-06 DIAGNOSIS — M542 Cervicalgia: Secondary | ICD-10-CM | POA: Diagnosis not present

## 2017-08-06 DIAGNOSIS — I10 Essential (primary) hypertension: Secondary | ICD-10-CM

## 2017-08-06 DIAGNOSIS — L409 Psoriasis, unspecified: Secondary | ICD-10-CM | POA: Diagnosis not present

## 2017-08-06 DIAGNOSIS — M5020 Other cervical disc displacement, unspecified cervical region: Secondary | ICD-10-CM | POA: Diagnosis not present

## 2017-08-06 DIAGNOSIS — G8929 Other chronic pain: Secondary | ICD-10-CM

## 2017-08-06 DIAGNOSIS — G894 Chronic pain syndrome: Secondary | ICD-10-CM | POA: Diagnosis not present

## 2017-08-06 DIAGNOSIS — L4050 Arthropathic psoriasis, unspecified: Secondary | ICD-10-CM

## 2017-08-06 DIAGNOSIS — M4312 Spondylolisthesis, cervical region: Secondary | ICD-10-CM | POA: Diagnosis not present

## 2017-08-06 DIAGNOSIS — Z8639 Personal history of other endocrine, nutritional and metabolic disease: Secondary | ICD-10-CM

## 2017-08-06 DIAGNOSIS — M8949 Other hypertrophic osteoarthropathy, multiple sites: Secondary | ICD-10-CM

## 2017-08-06 DIAGNOSIS — M533 Sacrococcygeal disorders, not elsewhere classified: Secondary | ICD-10-CM

## 2017-08-06 DIAGNOSIS — M150 Primary generalized (osteo)arthritis: Secondary | ICD-10-CM | POA: Diagnosis not present

## 2017-08-06 DIAGNOSIS — Z79899 Other long term (current) drug therapy: Secondary | ICD-10-CM

## 2017-08-06 DIAGNOSIS — K224 Dyskinesia of esophagus: Secondary | ICD-10-CM | POA: Diagnosis not present

## 2017-08-06 DIAGNOSIS — M4802 Spinal stenosis, cervical region: Secondary | ICD-10-CM | POA: Diagnosis not present

## 2017-08-06 DIAGNOSIS — M4712 Other spondylosis with myelopathy, cervical region: Secondary | ICD-10-CM | POA: Diagnosis not present

## 2017-08-06 DIAGNOSIS — K52831 Collagenous colitis: Secondary | ICD-10-CM

## 2017-08-06 MED ORDER — LEFLUNOMIDE 20 MG PO TABS: 20.0000 mg | ORAL_TABLET | Freq: Every day | ORAL | 0 refills | Status: DC

## 2017-08-06 MED ORDER — TRIAMCINOLONE ACETONIDE 40 MG/ML IJ SUSP
40.0000 mg | INTRAMUSCULAR | Status: AC | PRN
  Administered 2017-08-06: 40 mg via INTRA_ARTICULAR

## 2017-08-06 MED ORDER — LIDOCAINE HCL 1 % IJ SOLN
1.0000 mL | INTRAMUSCULAR | Status: AC | PRN
Start: 2017-08-06 — End: 2017-08-06
  Administered 2017-08-06: 1 mL

## 2017-08-06 NOTE — Patient Instructions (Addendum)
Standing Labs We placed an order today for your standing lab work.    Please come back and get your standing labs in July and every 3 months   We have open lab Monday through Friday from 8:30-11:30 AM and 1:30-4:00 PM  at the office of Dr. Shaili Deveshwar.   You may experience shorter wait times on Monday and Friday afternoons. The office is located at [address], [address], [place], [place] [address] No appointment is necessary.   Labs are drawn by [facility].  You may receive a bill from [facility] for your lab work. If you have any questions regarding directions or hours of operation,  please call [phone].    

## 2017-08-14 ENCOUNTER — Encounter (HOSPITAL_COMMUNITY): Payer: Self-pay

## 2017-08-14 ENCOUNTER — Ambulatory Visit (HOSPITAL_COMMUNITY)
Admission: RE | Admit: 2017-08-14 | Discharge: 2017-08-14 | Disposition: A | Discharge status: Home / Self Care | Payer: PPO | Source: Ambulatory Visit | Attending: Family Medicine | Admitting: Family Medicine

## 2017-08-14 DIAGNOSIS — Z1231 Encounter for screening mammogram for malignant neoplasm of breast: Secondary | ICD-10-CM | POA: Diagnosis not present

## 2017-08-21 ENCOUNTER — Telehealth: Payer: Self-pay | Admitting: Rheumatology

## 2017-08-21 DIAGNOSIS — Z5181 Encounter for therapeutic drug level monitoring: Secondary | ICD-10-CM

## 2017-08-21 NOTE — Telephone Encounter (Signed)
Last visit: 08/06/2017 Next visit: 12/09/2017 UDS: 05/09/2017 Narc agreement: 12/20/2016  Okay to refill hydrocodone?

## 2017-08-21 NOTE — Telephone Encounter (Signed)
Ok . Renew UDS q 3 months.

## 2017-08-21 NOTE — Telephone Encounter (Signed)
Patient requesting a refill on Hydrocodone sent to [facility] in [place]. Patient also would like you to know see is having c-spine surgery with Neuro on 09/10/2007. With Dr. Arnoldo Morale.

## 2017-08-22 ENCOUNTER — Telehealth: Payer: Self-pay | Admitting: Rheumatology

## 2017-08-22 MED ORDER — HYDROCODONE-ACETAMINOPHEN 5-325 MG PO TABS: ORAL_TABLET | ORAL | 0 refills | Status: DC

## 2017-08-22 NOTE — Telephone Encounter (Signed)
See previous phone note.  

## 2017-08-22 NOTE — Telephone Encounter (Signed)
Patient called stating she was returning a call to the office.   

## 2017-08-22 NOTE — Telephone Encounter (Signed)
Patient advised to update UDS every 3 months, patient verbalized understanding. Patient will come in next week to complete UDS.

## 2017-08-23 ENCOUNTER — Other Ambulatory Visit: Payer: Self-pay | Admitting: *Deleted

## 2017-08-23 ENCOUNTER — Telehealth: Payer: Self-pay | Admitting: Rheumatology

## 2017-08-23 MED ORDER — HYDROCODONE-ACETAMINOPHEN 5-325 MG PO TABS: ORAL_TABLET | ORAL | 0 refills | Status: DC

## 2017-08-23 NOTE — Progress Notes (Signed)
Original printed prescription was faxed to the pharmacy. This medication can not be faxed. Will reprint so the patient may pick up.

## 2017-08-23 NOTE — Telephone Encounter (Signed)
Original printed prescription was faxed to the pharmacy. This medication can not be faxed. Will reprint so the patient may pick up.

## 2017-08-23 NOTE — Telephone Encounter (Signed)
Patient advised prescription ready for pick up

## 2017-08-23 NOTE — Telephone Encounter (Signed)
Patient called to check on the status of her prescription of Hydrocodone.  [facility] has not received the prescription.  Patient states she will be in [place] until 2:30 pm today and can pick up the prescription at our office if it cannot be called into pharmacy.  Please call [phone]

## 2017-09-03 ENCOUNTER — Other Ambulatory Visit: Payer: Self-pay | Admitting: Rheumatology

## 2017-09-03 ENCOUNTER — Other Ambulatory Visit: Payer: Self-pay

## 2017-09-03 DIAGNOSIS — Z5181 Encounter for therapeutic drug level monitoring: Secondary | ICD-10-CM

## 2017-09-04 NOTE — Telephone Encounter (Signed)
Last visit: 07/07/17 Next Visit: 12/09/17  Okay to refill per Dr. Deveshwar  

## 2017-09-07 LAB — PAIN MGMT, PROFILE 5 W/CONF, U
Amphetamines: NEGATIVE ng/mL (ref <500)
Barbiturates: NEGATIVE ng/mL (ref <300)
Benzodiazepines: NEGATIVE ng/mL (ref <100)
Cocaine Metabolite: NEGATIVE ng/mL (ref <150)
Codeine: NEGATIVE ng/mL (ref <50)
Creatinine: 43.3 mg/dL
Hydrocodone: 181 ng/mL — ABNORMAL HIGH (ref <50)
Hydromorphone: NEGATIVE ng/mL (ref <50)
Marijuana Metabolite: NEGATIVE ng/mL (ref <20)
Methadone Metabolite: NEGATIVE ng/mL (ref <100)
Morphine: NEGATIVE ng/mL (ref <50)
Norhydrocodone: 276 ng/mL — ABNORMAL HIGH (ref <50)
Opiates: POSITIVE ng/mL — AB (ref <100)
Oxidant: NEGATIVE ug/mL (ref <200)
Oxycodone: NEGATIVE ng/mL (ref <100)
pH: 6.56 (ref 4.5–9.0)

## 2017-09-09 DIAGNOSIS — M4712 Other spondylosis with myelopathy, cervical region: Secondary | ICD-10-CM | POA: Diagnosis not present

## 2017-09-09 DIAGNOSIS — M4722 Other spondylosis with radiculopathy, cervical region: Secondary | ICD-10-CM | POA: Diagnosis not present

## 2017-09-09 HISTORY — PX: CERVICAL SPINE SURGERY: SHX589

## 2017-09-09 NOTE — Progress Notes (Signed)
C/w

## 2017-09-10 ENCOUNTER — Telehealth: Payer: Self-pay | Admitting: Rheumatology

## 2017-09-10 NOTE — Telephone Encounter (Signed)
Patient called to let Dr. Estanislado Pandy know that she was prescribed Oxycodone and Flexeril by Dr. Arnoldo Morale at [facility].  Patient states in her narcotic contract that she signed she needed to let Dr. Estanislado Pandy know when she was prescribed these medications.

## 2017-10-01 DIAGNOSIS — M542 Cervicalgia: Secondary | ICD-10-CM | POA: Diagnosis not present

## 2017-10-04 ENCOUNTER — Telehealth: Payer: Self-pay | Admitting: Rheumatology

## 2017-10-04 NOTE — Telephone Encounter (Signed)
Last visit: 07/07/17 Next Visit: 12/09/17 UDS: 05/09/2017 Narc agreement: 12/20/2016   Okay to refill Hydrocodone?

## 2017-10-04 NOTE — Telephone Encounter (Signed)
Patient request a refill on Hydrocodone sent to [facility].

## 2017-10-07 MED ORDER — HYDROCODONE-ACETAMINOPHEN 5-325 MG PO TABS: ORAL_TABLET | ORAL | 0 refills | Status: DC

## 2017-10-08 ENCOUNTER — Other Ambulatory Visit: Payer: Self-pay | Admitting: Rheumatology

## 2017-10-08 ENCOUNTER — Other Ambulatory Visit: Payer: Self-pay | Admitting: *Deleted

## 2017-10-08 DIAGNOSIS — Z79899 Other long term (current) drug therapy: Secondary | ICD-10-CM

## 2017-10-08 NOTE — Telephone Encounter (Signed)
Last visit: 07/07/17 Next Visit: 12/09/17  Okay to refill per Dr. Estanislado Pandy

## 2017-10-09 LAB — CBC WITH DIFFERENTIAL/PLATELET
Basophils Absolute: 100 cells/uL (ref 0–200)
Basophils Relative: 0.9 %
Eosinophils Absolute: 377 cells/uL (ref 15–500)
Eosinophils Relative: 3.4 %
HCT: 36.8 % (ref 35.0–45.0)
Hemoglobin: 12.6 g/dL (ref 11.7–15.5)
Lymphs Abs: 1188 cells/uL (ref 850–3900)
MCH: 32.3 pg (ref 27.0–33.0)
MCHC: 34.2 g/dL (ref 32.0–36.0)
MCV: 94.4 fL (ref 80.0–100.0)
MPV: 11.6 fL (ref 7.5–12.5)
Monocytes Relative: 5.4 %
Neutro Abs: 8836 cells/uL — ABNORMAL HIGH (ref 1500–7800)
Neutrophils Relative %: 79.6 %
Platelets: 240 10*3/uL (ref 140–400)
RBC: 3.9 10*6/uL (ref 3.80–5.10)
RDW: 13 % (ref 11.0–15.0)
Total Lymphocyte: 10.7 %
WBC mixed population: 599 cells/uL (ref 200–950)
WBC: 11.1 10*3/uL — ABNORMAL HIGH (ref 3.8–10.8)

## 2017-10-09 LAB — COMPLETE METABOLIC PANEL WITH GFR
AG Ratio: 1.7 (calc) (ref 1.0–2.5)
ALT: 35 U/L — ABNORMAL HIGH (ref 6–29)
AST: 26 U/L (ref 10–35)
Albumin: 4.3 g/dL (ref 3.6–5.1)
Alkaline phosphatase (APISO): 66 U/L (ref 33–130)
BUN: 19 mg/dL (ref 7–25)
CO2: 23 mmol/L (ref 20–32)
Calcium: 9.5 mg/dL (ref 8.6–10.4)
Chloride: 101 mmol/L (ref 98–110)
Creat: 0.82 mg/dL (ref 0.50–0.99)
GFR, Est African American: 86 mL/min/{1.73_m2} (ref >=60)
GFR, Est Non African American: 74 mL/min/{1.73_m2} (ref >=60)
Globulin: 2.5 g/dL (calc) (ref 1.9–3.7)
Glucose, Bld: 111 mg/dL — ABNORMAL HIGH (ref 65–99)
Potassium: 4.3 mmol/L (ref 3.5–5.3)
Sodium: 131 mmol/L — ABNORMAL LOW (ref 135–146)
Total Bilirubin: 0.3 mg/dL (ref 0.2–1.2)
Total Protein: 6.8 g/dL (ref 6.1–8.1)

## 2017-10-10 ENCOUNTER — Telehealth: Payer: Self-pay | Admitting: Rheumatology

## 2017-10-10 NOTE — Telephone Encounter (Signed)
Last visit: 07/07/17 Next Visit: 12/09/17 Labs: 10/08/17 stable Tb Gold: 07/09/17 neg   Okay to refill per Dr. Estanislado Pandy  Faxed to form [employer]

## 2017-10-10 NOTE — Telephone Encounter (Signed)
Patient called requesting prescription refill of Cosentyx to be sent to [facility] in [place].

## 2017-10-14 ENCOUNTER — Other Ambulatory Visit: Payer: Self-pay | Admitting: Gastroenterology

## 2017-10-14 ENCOUNTER — Other Ambulatory Visit: Payer: Self-pay | Admitting: Rheumatology

## 2017-10-14 DIAGNOSIS — K219 Gastro-esophageal reflux disease without esophagitis: Secondary | ICD-10-CM

## 2017-10-14 DIAGNOSIS — R197 Diarrhea, unspecified: Secondary | ICD-10-CM

## 2017-10-14 NOTE — Telephone Encounter (Signed)
Last visit: 07/07/17 Next Visit: 12/09/17  Okay to refill per Dr. Estanislado Pandy

## 2017-10-17 ENCOUNTER — Telehealth: Payer: Self-pay | Admitting: Rheumatology

## 2017-10-17 NOTE — Telephone Encounter (Signed)
Patient advised we have not received anything to clarify. Patient advised once we receive the information I will fill it out and send the information back to the pharmacy.

## 2017-10-17 NOTE — Telephone Encounter (Signed)
Patient left a voicemail stating that the specialty pharmacy received the new prescription for Cosentyx from Dr. Estanislado Pandy, but they were sending it back for clarification.  Patient is checking on the status of the prescription.

## 2017-10-22 ENCOUNTER — Telehealth: Payer: Self-pay | Admitting: Rheumatology

## 2017-10-22 NOTE — Telephone Encounter (Signed)
Patient left a voicemail stating she received a call from the specialty pharmacy regarding her prescription of Cosentyx.  They stated they need clarification on the dose.  Please call them back at [phone].  Patient states they might need clarification because "the prescription should read take 2 pens 300 mg under the skin every 30 days and not the way I take it which is 1 pen every 2 weeks, but that is not the way its technically ordered."  If you have any questions, please call me back at [phone]

## 2017-10-23 ENCOUNTER — Telehealth: Payer: Self-pay | Admitting: Rheumatology

## 2017-10-23 NOTE — Telephone Encounter (Signed)
FYI: Per patient request, sister Haywood Filler (03/14/1957) has permission to pick up her Cosentyx sample.

## 2017-10-23 NOTE — Telephone Encounter (Signed)
Patient advised she may come by the office to pick up a sample.

## 2017-10-23 NOTE — Telephone Encounter (Signed)
Clarification has been faxed to the pharmacy.

## 2017-10-23 NOTE — Telephone Encounter (Signed)
Medication Samples have been provided to the patient.  Drug name: Cosentyx       Strength: 150mg        Qty: 2 pens   LOT: [id number]  Exp.Date: 04/2019  Dosing instructions: Inject 150mg  into the skin every 14 days.   The patient has been instructed regarding the correct time, dose, and frequency of taking this medication, including desired effects and most common side effects.   Colbie Danner C Murdock Jellison 12:04 PM 10/23/2017

## 2017-10-23 NOTE — Telephone Encounter (Signed)
Patient called stating she is due to take her Cosentyx today and still hasn't received her medication.  Patient is asking if she can stop by the office and get a sample.

## 2017-10-25 ENCOUNTER — Telehealth: Payer: Self-pay | Admitting: Rheumatology

## 2017-10-25 NOTE — Telephone Encounter (Signed)
Patient advised that the clarification has been faxed.

## 2017-10-25 NOTE — Telephone Encounter (Signed)
Patient left a voicemail stating the pharmacy received the prescription, but said "there was a problem with the way it was written."  Patient states they need someone to call the pharmacist directly at [phone].

## 2017-11-02 ENCOUNTER — Other Ambulatory Visit: Payer: Self-pay | Admitting: Rheumatology

## 2017-11-04 NOTE — Telephone Encounter (Signed)
Last visit: 08/06/2017 Next visit: 12/09/2017  Last fill of pamelor: 10/14/2017  Okay to refill prednisone, topamax and pamelor?

## 2017-11-11 DIAGNOSIS — M4712 Other spondylosis with myelopathy, cervical region: Secondary | ICD-10-CM | POA: Diagnosis not present

## 2017-11-12 ENCOUNTER — Telehealth: Payer: Self-pay | Admitting: Rheumatology

## 2017-11-12 MED ORDER — HYDROCODONE-ACETAMINOPHEN 5-325 MG PO TABS: ORAL_TABLET | ORAL | 0 refills | Status: DC

## 2017-11-12 NOTE — Telephone Encounter (Signed)
Last visit: 08/06/2017 Next visit: 12/09/2017 UDS: 09/03/2017 c/w Narc agreement: 12/20/2016  Last fill: 10/07/2017  Okay to refill hydrocodone?

## 2017-11-12 NOTE — Telephone Encounter (Signed)
Patient request refill on Hydrocodone sent to [facility]. °

## 2017-11-14 ENCOUNTER — Ambulatory Visit: Payer: PPO | Admitting: Gastroenterology

## 2017-11-14 ENCOUNTER — Other Ambulatory Visit: Payer: Self-pay | Admitting: Gastroenterology

## 2017-11-14 ENCOUNTER — Telehealth: Payer: Self-pay

## 2017-11-14 ENCOUNTER — Ambulatory Visit (HOSPITAL_COMMUNITY)
Admission: RE | Admit: 2017-11-14 | Discharge: 2017-11-14 | Disposition: A | Discharge status: Home / Self Care | Payer: PPO | Source: Ambulatory Visit | Attending: Gastroenterology | Admitting: Gastroenterology

## 2017-11-14 ENCOUNTER — Encounter

## 2017-11-14 ENCOUNTER — Encounter: Payer: Self-pay | Admitting: Gastroenterology

## 2017-11-14 DIAGNOSIS — R1011 Right upper quadrant pain: Secondary | ICD-10-CM | POA: Insufficient documentation

## 2017-11-14 DIAGNOSIS — S2242XD Multiple fractures of ribs, left side, subsequent encounter for fracture with routine healing: Secondary | ICD-10-CM | POA: Insufficient documentation

## 2017-11-14 DIAGNOSIS — K224 Dyskinesia of esophagus: Secondary | ICD-10-CM

## 2017-11-14 DIAGNOSIS — R071 Chest pain on breathing: Secondary | ICD-10-CM

## 2017-11-14 DIAGNOSIS — M5124 Other intervertebral disc displacement, thoracic region: Secondary | ICD-10-CM | POA: Diagnosis not present

## 2017-11-14 DIAGNOSIS — K52831 Collagenous colitis: Secondary | ICD-10-CM | POA: Diagnosis not present

## 2017-11-14 DIAGNOSIS — K219 Gastro-esophageal reflux disease without esophagitis: Secondary | ICD-10-CM

## 2017-11-14 DIAGNOSIS — M5134 Other intervertebral disc degeneration, thoracic region: Secondary | ICD-10-CM | POA: Diagnosis not present

## 2017-11-14 DIAGNOSIS — X58XXXD Exposure to other specified factors, subsequent encounter: Secondary | ICD-10-CM | POA: Insufficient documentation

## 2017-11-14 DIAGNOSIS — M4804 Spinal stenosis, thoracic region: Secondary | ICD-10-CM | POA: Diagnosis not present

## 2017-11-14 DIAGNOSIS — R079 Chest pain, unspecified: Secondary | ICD-10-CM | POA: Insufficient documentation

## 2017-11-14 LAB — COMPLETE METABOLIC PANEL WITH GFR
AG Ratio: 2 (calc) (ref 1.0–2.5)
ALT: 43 U/L — ABNORMAL HIGH (ref 6–29)
AST: 28 U/L (ref 10–35)
Albumin: 4.3 g/dL (ref 3.6–5.1)
Alkaline phosphatase (APISO): 57 U/L (ref 33–130)
BUN: 21 mg/dL (ref 7–25)
CO2: 24 mmol/L (ref 20–32)
Calcium: 9.9 mg/dL (ref 8.6–10.4)
Chloride: 107 mmol/L (ref 98–110)
Creat: 0.92 mg/dL (ref 0.50–0.99)
GFR, Est African American: 75 mL/min/{1.73_m2} (ref >=60)
GFR, Est Non African American: 64 mL/min/{1.73_m2} (ref >=60)
Globulin: 2.2 g/dL (calc) (ref 1.9–3.7)
Glucose, Bld: 133 mg/dL (ref 65–139)
Potassium: 3.6 mmol/L (ref 3.5–5.3)
Sodium: 140 mmol/L (ref 135–146)
Total Bilirubin: 0.3 mg/dL (ref 0.2–1.2)
Total Protein: 6.5 g/dL (ref 6.1–8.1)

## 2017-11-14 LAB — LIPASE: Lipase: 34 U/L (ref 7–60)

## 2017-11-14 MED ORDER — IOPAMIDOL (ISOVUE-370) INJECTION 76%
100.0000 mL | Freq: Once | INTRAVENOUS | Status: AC | PRN
  Administered 2017-11-14: 100 mL via INTRAVENOUS

## 2017-11-14 NOTE — Assessment & Plan Note (Signed)
SYMPTOMS FAIRLY WELL CONTROLLED ONLY HAVING TROUBLE WITH LARGE PILLS.  CONTINUE TO MONITOR SYMPTOMS.

## 2017-11-14 NOTE — Assessment & Plan Note (Addendum)
HAD A FLARE AFTER SURGERY OTHERWISE SYMPTOMS FAIRLY WELL CONTROLLED.  CONTINUE TO MONITOR SYMPTOMS. CONTINUE OMEPRAZOLE.  TAKE 30 MINUTES PRIOR TO YOUR FIRST MEAL. FOLLOW UP IN 4 MOS.

## 2017-11-14 NOTE — Assessment & Plan Note (Addendum)
SYMPTOMS CONTROLLED/RESOLVED AFTER ENTOCORT. OFF MEDS FOR 2-3 WEEKS. WEIGH STABLE SINCE FEB 3754(360 LBS).  CONTINUE TO MONITOR SYMPTOMS. FOLLOW UP IN 4 MOS.

## 2017-11-14 NOTE — Progress Notes (Signed)
cc'ed to pcp °

## 2017-11-14 NOTE — Assessment & Plan Note (Addendum)
ASSOCIATED WITH UPPER ABDOMINAL PAIN AND R SUBSCAPULAR PAIN/COUGH, AND NEW ONSET FATIGUE IN PT ON MULTIPLE MMUNOSUPPRESSANTS.  DIFFERENTIAL DIAGNOSIS INCLUDES: CHRONIC PE, OCCULT MALIGNANCY, PNA,PANCREATITIS.  Complete CT CHEST/ABD/THORACIC SPINE AND LABS TODAY. CONTINUE OMEPRAZOLE.  TAKE 30 MINUTES PRIOR TO YOUR FIRST MEAL. PLEASE CALL WITH QUESTIONS OR CONCERNS.  FOLLOW UP IN 4 MOS.

## 2017-11-14 NOTE — Patient Instructions (Addendum)
Complete CT AND LABS TODAY.  CONTINUE OMEPRAZOLE.  TAKE 30 MINUTES PRIOR TO YOUR FIRST MEAL.  PLEASE CALL WITH QUESTIONS OR CONCERNS.  FOLLOW UP IN 4 MOS.

## 2017-11-14 NOTE — Progress Notes (Signed)
Subjective:    Patient ID: Tonya Hall, female    DOB: January 24, 1951, 67 y.o.   MRN: [id number]  [name], MD  HPI HAD SURGERY ON NECK(LAMINECTOMY Sep 09, 2017) . STARTED HAVING PAIN IN HAVING SEVERE PAIN IN LUE AFTER SURGERY. NOTHING HELPS. INITIALLY 2/10 AND THEN 8/10. HAD SEVERE INDIGESTION FROM TRYING TO EAT AFTER SURGERY FOR A WEEK OR TWO AND THEN IT GOT BETTER.PAIN IN R POSTERIOR SHOULDER/SHOULDER BLADE. IF TAKES A DEEP BREATH HURTS IN LOWER RIBS. Feels fatigued and moving less since surgery.  SINCE BEFORE SURGERY SITS ON TOILET AND HAS PAIN IN BILATERAL BUQs AND WHEN GETS UP BACK PAIN RADIATING TO THE FRONT [place]. BMs: 1-2X/DAY, NL. MAY BE A LITTLE SOFT. FINISHED STEROIDS 2-3 WEEKS AGO. SYMPTOMS ABOUT THE SAME IF NOT A LITTLE WORSE. APPT WITH NEUROSURGEON AUG 16. AS THE DAY GOES ON CAN'T SWALLOW BIG POTASSIUM PILL. THINS ESOPHAGEAL MOTILITY GETS WORSE AS DAY GOES ON.   PT DENIES FEVER, CHILLS, DYSURIA, HEMATURIA, HEMATOCHEZIA, HEMATEMESIS, nausea, vomiting, melena, diarrhea, CHEST PAIN, SHORTNESS OF BREATH, CHANGE IN BOWEL IN HABITS, constipation, abdominal pain, OR problems with sedation.  Past Medical History:  Diagnosis Date  . Anemia    at times  . Asthma    Albuterol in haler prn  . Cataract    immature unsure which eye  . Chronic back pain   . Degenerative disk disease    psoriatic  . Diabetes mellitus without complication (HCC)    diet controlled  . Esophageal motility disorder    Non-specific, see modified barium study/speech path, BP  . GERD (gastroesophageal reflux disease)    takes Protonix bid  . History of blood transfusion    did have a rash after receiving the blood  . History of blood transfusion    post c-section  . History of bronchitis    last time early 2014  . HTN (hypertension)    takes Metoprolol daily and SPironolactone daily  . Hypercholesterolemia   . Hyperlipidemia    takes Pravastatin daily  . Hypokalemia   . Lymphocytic colitis  05/26/10 TCS RMR   Responded to Entocort x 3 MOS  . Monilia infection 12/93  . Neuropathy   . Osteoporosis    gets Boniva every 3 months  . Peripheral edema    takes HCTZ daily  . PONV (postoperative nausea and vomiting)   . Psoriatic arthritis ([place])    Dr. Katherina Right  . Raynaud's disease   . Seasonal allergies   . Shortness of breath    with exertion  . SUI (stress urinary incontinence, female)   . Urinary urgency     Past Surgical History:  Procedure Laterality Date  . BIOPSY  04/17/2016   Procedure: BIOPSY;  Surgeon: Danie Binder, MD;  Location: [facility];  Service: Endoscopy;;  random colon bx's  . CESAREAN SECTION  1974, 1978      . COLONOSCOPY  06/19/2008   QQI:WLNLG internal hemorrhoids/mild sigmoin colon diverticulosis  . COLONOSCOPY  2012   Dr. Gala Romney: normal rectum, diverticula, lymphocytic colitis   . COLONOSCOPY WITH PROPOFOL N/A 04/17/2016   Procedure: COLONOSCOPY WITH PROPOFOL;  Surgeon: Danie Binder, MD;  Location: [facility];  Service: Endoscopy;  Laterality: N/A;  900  . DILATION AND CURETTAGE OF UTERUS  1977   spontaneous abortion  . ESOPHAGOGASTRODUODENOSCOPY  06/19/2008   XQJ:JHERDEYC gastritis  . LUMBAR DISC ARTHROPLASTY  7/98  . LUMBAR FUSION  6/03, 11/08, 11/14   L4-5 fusion,  L2-3 fusion, L1-2  . TONSILLECTOMY AND ADENOIDECTOMY    . TUBAL LIGATION     Allergies  Allergen Reactions  . Adalimumab Rash and Other (See Comments)    FEVER, Fast pulse  . Imuran [Azathioprine Sodium] Rash    Denies Airway involvement  . Methotrexate Other (See Comments)    Liver Enzyme elevation - after 1 month of use  . Plaquenil [Hydroxychloroquine Sulfate] Rash    Denies airway involvement.   . Sulfonamide Derivatives Rash    Occurred with Sulfasalazine. Rash only.   . Ceftin [Cefuroxime Axetil] Nausea Only    Upset stomach  . Morphine Nausea And Vomiting    IV morphine caused N and V  After surgery.   Has tolerated [place] in the past    Current  Outpatient Medications  Medication Sig    . albuterol (PROVENTIL HFA;VENTOLIN HFA) 108 (90 BASE) MCG/ACT inhaler Inhale 2 puffs into the lungs every 6 (six) hours as needed for wheezing or shortness of breath.     Marland Kitchen aspirin 81 MG tablet Take 81 mg by mouth daily.      . Calcium Carb-Cholecalciferol (CALCIUM 600 + D PO) Take 1 tablet by mouth 2 (two) times daily.    . cetirizine (ZYRTEC) 10 MG tablet Take 10 mg by mouth daily as needed for allergies.    H[place] SENSOREADY 300 DOSE 150 MG/ML SOAJ Inject 2 pens into the skin every 30 (thirty) days. Reported on 08/12/2015 (Patient taking differently: Inject 2 pens into the skin every 30 (thirty) days. Reported on 08/12/2015)    . cyclobenzaprine (FLEXERIL) 10 MG tablet Take 10 mg by mouth as needed for muscle spasms.    . diclofenac sodium (VOLTAREN) 1 % GEL Apply 2 g topically 3 (three) times daily as needed (pain).     Marland Kitchen ezetimibe (ZETIA) 10 MG tablet Take 10 mg by mouth daily.    . fish oil-omega-3 fatty acids 1000 MG capsule Take 2 g by mouth daily.     . Ginger, Zingiber officinalis, (GINGER ROOT) 500 MG CAPS Take 1 capsule by mouth daily.     . Glucosamine Sulfate 1000 MG CAPS Take 2 capsules (2,000 mg total) by mouth daily.    Marland Kitchen guaiFENesin-dextromethorphan (ROBITUSSIN DM) 100-10 MG/5ML syrup Take 10 mLs by mouth every 6 (six) hours as needed for cough.    Marland Kitchen HUMULIN N KWIKPEN 100 UNIT/ML Kiwkpen Inject 10 Units into the skin at bedtime.    Marland Kitchen HYDROcodone-acetaminophen (NORCO/VICODIN) 5-325 MG tablet Take 1 tablet by mouth 2-3 times daily as needed for pain    . hydrocortisone sodium succinate (SOLU-CORTEF) 100 MG SOLR injection Inject 100 mg into the muscle. Take as needed when unable to swallow prednisone tablets     . Hypromellose (ARTIFICIAL TEARS OP) Apply 1 drop to eye daily as needed (dry eyes).    Marland Kitchen leflunomide (ARAVA) 20 MG tablet Take 1 tablet (20 mg total) by mouth daily.    Marland Kitchen losartan (COZAAR) 50 MG tablet Take 50 mg by mouth daily.     . metoprolol succinate (TOPROL-XL) 50 MG 24 hr tablet Take 1 tablet (50 mg total) by mouth daily. Take with or immediately following a meal.    . Misc Natural Products (TART CHERRY ADVANCED PO) Take 1 tablet by mouth daily. daily     . Multiple Vitamin (MULTIVITAMIN) tablet Take 1 tablet by mouth daily.      . nortriptyline (PAMELOR) 25 MG capsule TAKE (1) CAPSULE BY MOUTH AT BEDTIME.    Marland Kitchen  omeprazole (PRILOSEC) 20 MG capsule TAKE ONE CAPSULE BY MOUTH TWICE DAILY.    Marland Kitchen potassium chloride SA (K-DUR,KLOR-CON) 20 MEQ tablet Take 20 mEq by mouth daily.     . predniSONE (DELTASONE) 5 MG tablet TAKE 1 TABLET BY MOUTH EVERY MORNING.    Marland Kitchen PROBIOTIC CAPS Take 1 capsule by mouth daily.    . RESTASIS MULTIDOSE 0.05 % ophthalmic emulsion Place 1 drop into both eyes daily.     Marland Kitchen spironolactone (ALDACTONE) 25 MG tablet Take 25 mg by mouth daily.     . SURE COMFORT PEN NEEDLES 31G X 5 MM MISC     . tiZANidine (ZANAFLEX) 4 MG tablet TAKE (1) TABLET BY MOUTH AT BEDTIME. (Patient taking differently: TAKE (1) TABLET BY MOUTH AT BEDTIME. AS NEEDED)    . topiramate (TOPAMAX) 200 MG tablet TAKE (1) TABLET BY MOUTH AT BEDTIME.    . Turmeric 500 MG CAPS Take 1 capsule by mouth daily.     . Zoledronic Acid (RECLAST IV) Inject into the vein.     .      . fluticasone (FLONASE) 50 MCG/ACT nasal spray Place 2 sprays into both nostrils as needed.     .             Review of Systems PER HPI OTHERWISE ALL SYSTEMS ARE NEGATIVE.    Objective:   Physical Exam  Constitutional: She is oriented to person, place, and time. She appears well-developed and well-nourished. No distress.  HENT:  Head: Normocephalic and atraumatic.  Mouth/Throat: Oropharynx is clear and moist. No oropharyngeal exudate.  Eyes: Pupils are equal, round, and reactive to light. No scleral icterus.  Neck: Normal range of motion. Neck supple.  Cardiovascular: Normal rate, regular rhythm and normal heart sounds.  Pulmonary/Chest: Effort normal and  breath sounds normal. No respiratory distress.  Abdominal: Soft. Bowel sounds are normal. She exhibits no distension. There is no tenderness.  Musculoskeletal: She exhibits deformity. She exhibits no edema.  Lymphadenopathy:    She has no cervical adenopathy.  Neurological: She is alert and oriented to person, place, and time.  NO  NEW FOCAL DEFICITS  Psychiatric: She has a normal mood and affect.  Vitals reviewed.     Assessment & Plan:

## 2017-11-14 NOTE — Telephone Encounter (Signed)
Called patient TO DISCUSS RESULTS. EXPLAINED FINDINGS ON CT: BRONCHITIS, L RIB FX, BULGING DISC WITH COMPRESSION ON R T10-11. PT WILL SEE NECK SURGEON AT 1 PM AUG 16.  CMP UNREMARKABLE EXCEPT ALT 43. HIGHLIGHTED VALUE IS NOT CLINICALLY SIGNIFICANT.

## 2017-11-14 NOTE — Progress Notes (Signed)
ON RECALL  °

## 2017-11-14 NOTE — Telephone Encounter (Signed)
Jennifer at [facility] CT called office to let [facility] know her CT results are in Epic.

## 2017-11-15 DIAGNOSIS — M5124 Other intervertebral disc displacement, thoracic region: Secondary | ICD-10-CM | POA: Diagnosis not present

## 2017-11-15 DIAGNOSIS — M5414 Radiculopathy, thoracic region: Secondary | ICD-10-CM | POA: Diagnosis not present

## 2017-11-15 DIAGNOSIS — M546 Pain in thoracic spine: Secondary | ICD-10-CM | POA: Diagnosis not present

## 2017-11-15 DIAGNOSIS — M5412 Radiculopathy, cervical region: Secondary | ICD-10-CM | POA: Diagnosis not present

## 2017-11-18 NOTE — Progress Notes (Signed)
CC'D TO PCP °

## 2017-11-19 ENCOUNTER — Other Ambulatory Visit: Payer: Self-pay | Admitting: *Deleted

## 2017-11-19 NOTE — Patient Outreach (Signed)
[facility]) Care Management  11/19/2017  Tonya Hall 01-13-1951 [id number]   Member identified as high risk according to [employer] health questionnaire.  Call placed to introduce [facility] care management services and perform telephone screening.  This care manager introduced self and purpose of call.  Screening complete, member denies any needs at this time, will not open case.  Valente David, [place], MSN [place] [phone]

## 2017-11-20 ENCOUNTER — Other Ambulatory Visit: Payer: Self-pay | Admitting: Neurosurgery

## 2017-11-20 ENCOUNTER — Other Ambulatory Visit (HOSPITAL_COMMUNITY): Payer: Self-pay | Admitting: Neurosurgery

## 2017-11-20 DIAGNOSIS — M5124 Other intervertebral disc displacement, thoracic region: Secondary | ICD-10-CM

## 2017-11-20 DIAGNOSIS — M4312 Spondylolisthesis, cervical region: Secondary | ICD-10-CM

## 2017-11-22 ENCOUNTER — Ambulatory Visit (HOSPITAL_COMMUNITY)
Admission: RE | Admit: 2017-11-22 | Discharge: 2017-11-22 | Disposition: A | Discharge status: Home / Self Care | Payer: PPO | Source: Ambulatory Visit | Attending: Neurosurgery | Admitting: Neurosurgery

## 2017-11-22 DIAGNOSIS — M4312 Spondylolisthesis, cervical region: Secondary | ICD-10-CM

## 2017-11-22 DIAGNOSIS — M50223 Other cervical disc displacement at C6-C7 level: Secondary | ICD-10-CM | POA: Diagnosis not present

## 2017-11-22 DIAGNOSIS — G039 Meningitis, unspecified: Secondary | ICD-10-CM | POA: Insufficient documentation

## 2017-11-22 DIAGNOSIS — M4802 Spinal stenosis, cervical region: Secondary | ICD-10-CM | POA: Diagnosis not present

## 2017-11-22 DIAGNOSIS — M4803 Spinal stenosis, cervicothoracic region: Secondary | ICD-10-CM | POA: Insufficient documentation

## 2017-11-22 DIAGNOSIS — M5124 Other intervertebral disc displacement, thoracic region: Secondary | ICD-10-CM | POA: Diagnosis not present

## 2017-11-22 DIAGNOSIS — M47814 Spondylosis without myelopathy or radiculopathy, thoracic region: Secondary | ICD-10-CM | POA: Diagnosis not present

## 2017-11-22 DIAGNOSIS — M47894 Other spondylosis, thoracic region: Secondary | ICD-10-CM | POA: Insufficient documentation

## 2017-11-25 NOTE — Progress Notes (Deleted)
Office Visit Note  Patient: Tonya Hall             Date of Birth: 26-Aug-1950           MRN: [id number]             PCP: [name], MD Referring: [name], MD Visit Date: 12/09/2017 Occupation: @GUAROCC @  Subjective:  No chief complaint on file.   History of Present Illness: Tonya Hall is a 67 y.o. female ***   Activities of Daily Living:  Patient reports morning stiffness for *** {minute/hour:19697}.   Patient {ACTIONS;DENIES/REPORTS:21021675::"Denies"} nocturnal pain.  Difficulty dressing/grooming: {ACTIONS;DENIES/REPORTS:21021675::"Denies"} Difficulty climbing stairs: {ACTIONS;DENIES/REPORTS:21021675::"Denies"} Difficulty getting out of chair: {ACTIONS;DENIES/REPORTS:21021675::"Denies"} Difficulty using hands for taps, buttons, cutlery, and/or writing: {ACTIONS;DENIES/REPORTS:21021675::"Denies"}  No Rheumatology ROS completed.   PMFS History:  Patient Active Problem List   Diagnosis Date Noted  . Chest pain 11/14/2017  . Transaminitis 06/22/2016  . Collagenous colitis 05/23/2016  . Psoriasis 05/21/2016  . High risk medication use 02/15/2016  . Age-related osteoporosis without current pathological fracture 02/15/2016  . Trochanteric bursitis, left hip 02/15/2016  . Sacroiliitis, not elsewhere classified ([place]) 02/15/2016  . Chronic pain syndrome 02/15/2016  . Abnormal weight gain 06/25/2012  . Hypertension 07/03/2011  . Hypercholesteremia 07/03/2011  . Psoriatic arthritis ([place]) 07/03/2011  . Osteoarthritis of multiple joints 07/03/2011  . Esophageal motility disorder 02/14/2011  . Cough 02/14/2011  . GERD 05/05/2010    Past Medical History:  Diagnosis Date  . Anemia    at times  . Asthma    Albuterol in haler prn  . Cataract    immature unsure which eye  . Chronic back pain   . Degenerative disk disease    psoriatic  . Diabetes mellitus without complication (HCC)    diet controlled  . Esophageal motility disorder    Non-specific, see  modified barium study/speech path, BP  . GERD (gastroesophageal reflux disease)    takes Protonix bid  . History of blood transfusion    did have a rash after receiving the blood  . History of blood transfusion    post c-section  . History of bronchitis    last time early 2014  . HTN (hypertension)    takes Metoprolol daily and SPironolactone daily  . Hypercholesterolemia   . Hyperlipidemia    takes Pravastatin daily  . Hypokalemia   . Lymphocytic colitis 05/26/10 TCS RMR   Responded to Entocort x 3 MOS  . Monilia infection 12/93  . Neuropathy   . Osteoporosis    gets Boniva every 3 months  . Peripheral edema    takes HCTZ daily  . PONV (postoperative nausea and vomiting)   . Psoriatic arthritis ([place])    Dr. Katherina Right  . Raynaud's disease   . Seasonal allergies   . Shortness of breath    with exertion  . SUI (stress urinary incontinence, female)   . Urinary urgency     Family History  Problem Relation Age of Onset  . Diabetes Mother   . Pancreatitis Mother   . Arthritis Mother        psoriatic   . Fibromyalgia Mother   . Rheumatic fever Father   . Diabetes Unknown        grandparents  . Skin cancer Unknown        grandfather  . Hypertension Unknown        grandparent  . Congestive Heart Failure Unknown        grandfather  .  Parkinson's disease Unknown        grandmother  . Colon cancer Maternal Uncle   . Fibromyalgia Sister   . Rheum arthritis Sister   . Diabetes Sister   . Fibromyalgia Sister   . Diabetes Sister   . Hypertension Son   . Colon polyps Neg Hx    Past Surgical History:  Procedure Laterality Date  . BIOPSY  04/17/2016   Procedure: BIOPSY;  Surgeon: Danie Binder, MD;  Location: [facility];  Service: Endoscopy;;  random colon bx's  . CESAREAN SECTION  1974, 1978      . COLONOSCOPY  06/19/2008   [facility]:HENID internal hemorrhoids/mild sigmoin colon diverticulosis  . COLONOSCOPY  2012   Dr. Gala Romney: normal rectum, diverticula, lymphocytic  colitis   . COLONOSCOPY WITH PROPOFOL N/A 04/17/2016   Procedure: COLONOSCOPY WITH PROPOFOL;  Surgeon: Danie Binder, MD;  Location: [facility];  Service: Endoscopy;  Laterality: N/A;  900  . DILATION AND CURETTAGE OF UTERUS  1977   spontaneous abortion  . ESOPHAGOGASTRODUODENOSCOPY  06/19/2008   POE:UMPNTIRW gastritis  . LUMBAR DISC ARTHROPLASTY  7/98  . LUMBAR FUSION  6/03, 11/08, 11/14   L4-5 fusion, L2-3 fusion, L1-2  . TONSILLECTOMY AND ADENOIDECTOMY    . TUBAL LIGATION     Social History   Social History Narrative  . Not on file    Objective: Vital Signs: LMP 02/01/1999    Physical Exam   Musculoskeletal Exam: ***  CDAI Exam: CDAI Score: Not documented Patient Global Assessment: Not documented; Provider Global Assessment: Not documented Swollen: Not documented; Tender: Not documented Joint Exam   Not documented   There is currently no information documented on the homunculus. Go to the Rheumatology activity and complete the homunculus joint exam.  Investigation: No additional findings.  Imaging: Ct Angio Chest Pe W Or Wo Contrast  Result Date: 11/14/2017 CLINICAL DATA:  Right upper quadrant pain without fever. EXAM: CT ANGIOGRAPHY CHEST CT ABDOMEN WITH CONTRAST TECHNIQUE: Multidetector CT imaging of the chest was performed using the standard protocol during bolus administration of intravenous contrast. Multiplanar CT image reconstructions and MIPs were obtained to evaluate the vascular anatomy. Multidetector CT imaging of the abdomen and pelvis was performed using the standard protocol during bolus administration of intravenous contrast. CONTRAST:  135mL ISOVUE-370 IOPAMIDOL (ISOVUE-370) INJECTION 76% COMPARISON:  None. FINDINGS: CTA CHEST FINDINGS Cardiovascular: Satisfactory opacification of the pulmonary arteries to the segmental level. No evidence of pulmonary embolism. Normal heart size. No pericardial effusion. Atheromatous wall thickening of the aorta.  Coronary calcification Mediastinum/Nodes: Negative for adenopathy. Lungs/Pleura: Diffuse airway thickening consistent with bronchitis. The major airways are patent. There is no edema, consolidation, effusion, or pneumothorax. Musculoskeletal: Healing anterior left fifth and sixth rib fractures.Partially covered C3-4 and C4-5 ACDF with hardware. There is generalized disc narrowing and spurring. Notable T10-11 right paracentral protrusion presumably contacting the cord and causing severe right T10-11 foraminal stenosis. There is also notable facet arthropathy on the left at this level with calcification, possibly gouty. Review of the MIP images confirms the above findings. CT ABDOMEN and PELVIS FINDINGS Hepatobiliary: No focal liver abnormality.No evidence of biliary obstruction or stone. Pancreas: Unremarkable. Spleen: Unremarkable. Adrenals/Urinary Tract: Negative adrenals. No hydronephrosis or stone. Stomach/Bowel:  No obstruction. No inflammatory changes Vascular/Lymphatic: No acute vascular abnormality. Mild atherosclerotic calcification. No mass or adenopathy. Other: No ascites or pneumoperitoneum. Musculoskeletal: L1-2, L2-3, and L3-4 posterior-lateral fusion. Solid arthrodesis seen posteriorly from L1-L3 and there is interbody discectomy and solid arthrodesis at  L3-4. No acute osseous finding. Review of the MIP images confirms the above findings. IMPRESSION: Chest CTA: 1. Negative for pulmonary embolism. 2. Disc degeneration with sizable right paracentral to foraminal protrusion at T10-11 causing cord and right foraminal impingement. Are patient's symptoms radicular? Unusual calcification associated with the left facet at this level, possibly gouty, although no erosion is seen. 3. Generalized bronchitic airway thickening. 4. Healing left fifth and sixth rib fractures Abdominal CT: No acute finding.  Normal appearance of the gallbladder. Electronically Signed   By: Monte Fantasia M.D.   On: 11/14/2017 13:32    Ct Abdomen W Contrast  Result Date: 11/14/2017 CLINICAL DATA:  Right upper quadrant pain without fever. EXAM: CT ANGIOGRAPHY CHEST CT ABDOMEN WITH CONTRAST TECHNIQUE: Multidetector CT imaging of the chest was performed using the standard protocol during bolus administration of intravenous contrast. Multiplanar CT image reconstructions and MIPs were obtained to evaluate the vascular anatomy. Multidetector CT imaging of the abdomen and pelvis was performed using the standard protocol during bolus administration of intravenous contrast. CONTRAST:  129mL ISOVUE-370 IOPAMIDOL (ISOVUE-370) INJECTION 76% COMPARISON:  None. FINDINGS: CTA CHEST FINDINGS Cardiovascular: Satisfactory opacification of the pulmonary arteries to the segmental level. No evidence of pulmonary embolism. Normal heart size. No pericardial effusion. Atheromatous wall thickening of the aorta. Coronary calcification Mediastinum/Nodes: Negative for adenopathy. Lungs/Pleura: Diffuse airway thickening consistent with bronchitis. The major airways are patent. There is no edema, consolidation, effusion, or pneumothorax. Musculoskeletal: Healing anterior left fifth and sixth rib fractures.Partially covered C3-4 and C4-5 ACDF with hardware. There is generalized disc narrowing and spurring. Notable T10-11 right paracentral protrusion presumably contacting the cord and causing severe right T10-11 foraminal stenosis. There is also notable facet arthropathy on the left at this level with calcification, possibly gouty. Review of the MIP images confirms the above findings. CT ABDOMEN and PELVIS FINDINGS Hepatobiliary: No focal liver abnormality.No evidence of biliary obstruction or stone. Pancreas: Unremarkable. Spleen: Unremarkable. Adrenals/Urinary Tract: Negative adrenals. No hydronephrosis or stone. Stomach/Bowel:  No obstruction. No inflammatory changes Vascular/Lymphatic: No acute vascular abnormality. Mild atherosclerotic calcification. No mass or  adenopathy. Other: No ascites or pneumoperitoneum. Musculoskeletal: L1-2, L2-3, and L3-4 posterior-lateral fusion. Solid arthrodesis seen posteriorly from L1-L3 and there is interbody discectomy and solid arthrodesis at L3-4. No acute osseous finding. Review of the MIP images confirms the above findings. IMPRESSION: Chest CTA: 1. Negative for pulmonary embolism. 2. Disc degeneration with sizable right paracentral to foraminal protrusion at T10-11 causing cord and right foraminal impingement. Are patient's symptoms radicular? Unusual calcification associated with the left facet at this level, possibly gouty, although no erosion is seen. 3. Generalized bronchitic airway thickening. 4. Healing left fifth and sixth rib fractures Abdominal CT: No acute finding.  Normal appearance of the gallbladder. Electronically Signed   By: Monte Fantasia M.D.   On: 11/14/2017 13:32   Mr Cervical Spine Wo Contrast  Result Date: 11/22/2017 CLINICAL DATA:  Right arm pain which started after cervical laminectomy 09/09/2017. Cervical spondylolisthesis. EXAM: MRI CERVICAL AND THORACIC SPINE WITHOUT CONTRAST TECHNIQUE: Multiplanar and multiecho pulse sequences of the cervical and thoracic were obtained without intravenous contrast. COMPARISON:  07/15/2017 cervical MRI FINDINGS: MRI CERVICAL SPINE FINDINGS Alignment: Mild C6-7 retrolisthesis and C7-T1 anterolisthesis. Vertebrae: C3-4 and C4-5 ACDF. No fracture, discitis, or aggressive bone lesion Cord: Normal signal and morphology Posterior Fossa, vertebral arteries, paraspinal tissues: Negative Disc levels: C1-2: Left more than right facet effusion. There is no underlying erosive change or marrow edema and this finding was also seen  on prior. C2-3: Asymmetric right facet and uncovertebral spurring with moderate right foraminal narrowing. C3-4: ACDF. There is improved canal and foraminal patency with resolved cord compression. C4-5: ACDF. Improved canal and foraminal patency with  resolved cord mass effect. C5-6: Asymmetric left bulky facet spurring. Disc narrowing and desiccation. Patent canal and foramina C6-7: Disc narrowing with circumferential bulging. Disc contacts the ventral cord without compression. There is advanced right foraminal stenosis. The left foramen is patent C7-T1:Facet arthropathy with advanced spurring on the right. Moderate right foraminal narrowing. Patent canal and left foramen. MRI THORACIC SPINE FINDINGS Alignment:  Exaggerated thoracic kyphosis. Vertebrae: No fracture, evidence of discitis, or bone lesion. Cord:  Normal signal and morphology. Paraspinal and other soft tissues: Negative. Disc levels: Generalized disc narrowing and desiccation. There is ligamentum flavum thickening at the upper thoracic levels. Small disc herniation seen at T8-9 and T9-10 without neural contact. T10-11 advanced disc disease with disc space collapse and large extrusion extending superiorly, greater towards the right. There is also focal advanced facet degeneration and spurring at this level with ligamentum flavum thickening. Herniation contacts the ventral cord without compression. There is biforaminal impingement. IMPRESSION: Cervical spine: 1. Significantly improved canal and foraminal patency at C3-4 and C4-5 after ACDF. 2. Stable disease elsewhere. 3. C2-3 moderate right foraminal narrowing 4. C6-7 high-grade right foraminal impingement. 5. C7-T1 moderate right foraminal narrowing. 6. Left more than right C1-2 facet effusions without erosion or marrow edema, stable. Thoracic spine: 1. T10-11 large disc extrusion with superior migration. There is cord contact without cord compression. Facet degeneration is also notable at this level and there is biforaminal impingement. 2. Noncompressive degenerative changes elsewhere are described above. Electronically Signed   By: Monte Fantasia M.D.   On: 11/22/2017 16:23   Mr Thoracic Spine Wo Contrast  Result Date: 11/22/2017 CLINICAL  DATA:  Right arm pain which started after cervical laminectomy 09/09/2017. Cervical spondylolisthesis. EXAM: MRI CERVICAL AND THORACIC SPINE WITHOUT CONTRAST TECHNIQUE: Multiplanar and multiecho pulse sequences of the cervical and thoracic were obtained without intravenous contrast. COMPARISON:  07/15/2017 cervical MRI FINDINGS: MRI CERVICAL SPINE FINDINGS Alignment: Mild C6-7 retrolisthesis and C7-T1 anterolisthesis. Vertebrae: C3-4 and C4-5 ACDF. No fracture, discitis, or aggressive bone lesion Cord: Normal signal and morphology Posterior Fossa, vertebral arteries, paraspinal tissues: Negative Disc levels: C1-2: Left more than right facet effusion. There is no underlying erosive change or marrow edema and this finding was also seen on prior. C2-3: Asymmetric right facet and uncovertebral spurring with moderate right foraminal narrowing. C3-4: ACDF. There is improved canal and foraminal patency with resolved cord compression. C4-5: ACDF. Improved canal and foraminal patency with resolved cord mass effect. C5-6: Asymmetric left bulky facet spurring. Disc narrowing and desiccation. Patent canal and foramina C6-7: Disc narrowing with circumferential bulging. Disc contacts the ventral cord without compression. There is advanced right foraminal stenosis. The left foramen is patent C7-T1:Facet arthropathy with advanced spurring on the right. Moderate right foraminal narrowing. Patent canal and left foramen. MRI THORACIC SPINE FINDINGS Alignment:  Exaggerated thoracic kyphosis. Vertebrae: No fracture, evidence of discitis, or bone lesion. Cord:  Normal signal and morphology. Paraspinal and other soft tissues: Negative. Disc levels: Generalized disc narrowing and desiccation. There is ligamentum flavum thickening at the upper thoracic levels. Small disc herniation seen at T8-9 and T9-10 without neural contact. T10-11 advanced disc disease with disc space collapse and large extrusion extending superiorly, greater towards  the right. There is also focal advanced facet degeneration and spurring at this level with ligamentum  flavum thickening. Herniation contacts the ventral cord without compression. There is biforaminal impingement. IMPRESSION: Cervical spine: 1. Significantly improved canal and foraminal patency at C3-4 and C4-5 after ACDF. 2. Stable disease elsewhere. 3. C2-3 moderate right foraminal narrowing 4. C6-7 high-grade right foraminal impingement. 5. C7-T1 moderate right foraminal narrowing. 6. Left more than right C1-2 facet effusions without erosion or marrow edema, stable. Thoracic spine: 1. T10-11 large disc extrusion with superior migration. There is cord contact without cord compression. Facet degeneration is also notable at this level and there is biforaminal impingement. 2. Noncompressive degenerative changes elsewhere are described above. Electronically Signed   By: Monte Fantasia M.D.   On: 11/22/2017 16:23   Ct T-spine No Charge  Result Date: 11/14/2017 CLINICAL DATA:  Right upper quadrant pain EXAM: CT Thoracic Spine with contrast TECHNIQUE: Multiplanar CT images of the thoracic spine were reconstructed from contemporary CT of the Chest. CONTRAST:  None additional COMPARISON:  Cervical radiography 11/11/2017 FINDINGS: Alignment: Mild exaggerated thoracic kyphosis. No listhesis. There is slight rightward translation at T10-11 Vertebrae: Negative for fracture, discitis, or aggressive bone lesion. There is partially covered ACDF and lumbar fusion hardware. Paraspinal and other soft tissues: Described separately Disc levels: The discs are diffusely narrowed with endplate ridging. Midthoracic levels show ligamentum flavum thickening and ossification. There is unusual asymmetric nodular calcification about the left facet at T10-11. Focal even more advanced disc narrowing at T10-11 with high-density right paracentral protrusion presumably contacting the cord and impinging on the right foramen. The left foramen at  this level is also narrow appearing due to spurring. IMPRESSION: 1. T10-11 right paracentral disc extrusion impinging on the cord and right foramen. There is also a degree of left foraminal stenosis at this level. 2. T10-11 nodular calcification about the left facet. Gout is a consideration although there is no associated erosion. Electronically Signed   By: Monte Fantasia M.D.   On: 11/14/2017 13:36    Recent Labs: Lab Results  Component Value Date   WBC 11.1 (H) 10/08/2017   HGB 12.6 10/08/2017   PLT 240 10/08/2017   NA 140 11/14/2017   K 3.6 11/14/2017   CL 107 11/14/2017   CO2 24 11/14/2017   GLUCOSE 133 11/14/2017   BUN 21 11/14/2017   CREATININE 0.92 11/14/2017   BILITOT 0.3 11/14/2017   ALKPHOS 49 11/20/2016   AST 28 11/14/2017   ALT 43 (H) 11/14/2017   PROT 6.5 11/14/2017   ALBUMIN 4.2 11/20/2016   CALCIUM 9.9 11/14/2017   GFRAA 75 11/14/2017   QFTBGOLD Negative 05/05/2015   QFTBGOLDPLUS NEGATIVE 07/09/2017    Speciality Comments: No specialty comments available.  Procedures:  No procedures performed Allergies: Adalimumab; Imuran [azathioprine sodium]; Methotrexate; Plaquenil [hydroxychloroquine sulfate]; Sulfonamide derivatives; Ceftin [cefuroxime axetil]; and Morphine   Assessment / Plan:     Visit Diagnoses: No diagnosis found.   Orders: No orders of the defined types were placed in this encounter.  No orders of the defined types were placed in this encounter.   Face-to-face time spent with patient was *** minutes. Greater than 50% of time was spent in counseling and coordination of care.  Follow-Up Instructions: No follow-ups on file.   Ofilia Neas, PA-C  Note - This record has been created using Dragon software.  Chart creation errors have been sought, but may not always  have been located. Such creation errors do not reflect on  the standard of medical care.

## 2017-11-26 ENCOUNTER — Telehealth: Payer: Self-pay | Admitting: Obstetrics & Gynecology

## 2017-11-26 ENCOUNTER — Ambulatory Visit: Payer: PPO | Admitting: Obstetrics & Gynecology

## 2017-11-26 ENCOUNTER — Encounter

## 2017-11-26 NOTE — Telephone Encounter (Signed)
Patient called and cancelled her AEX today with Dr. Sabra Heck due to being sick with bronchitis. She will call back to reschedule when she's feeling better. Routing to provider for FYI only.

## 2017-12-05 ENCOUNTER — Other Ambulatory Visit: Payer: Self-pay | Admitting: Physician Assistant

## 2017-12-05 DIAGNOSIS — M5124 Other intervertebral disc displacement, thoracic region: Secondary | ICD-10-CM | POA: Diagnosis not present

## 2017-12-05 DIAGNOSIS — M542 Cervicalgia: Secondary | ICD-10-CM | POA: Diagnosis not present

## 2017-12-05 DIAGNOSIS — I10 Essential (primary) hypertension: Secondary | ICD-10-CM | POA: Diagnosis not present

## 2017-12-05 DIAGNOSIS — M4312 Spondylolisthesis, cervical region: Secondary | ICD-10-CM | POA: Diagnosis not present

## 2017-12-05 NOTE — Telephone Encounter (Signed)
Last visit: 08/06/2017 Next visit: 12/09/2017 Labs: 10/08/2017 stable  Okay to refill arava, prednisone and pamelor?

## 2017-12-09 ENCOUNTER — Ambulatory Visit: Payer: PPO | Admitting: Physician Assistant

## 2017-12-16 NOTE — Progress Notes (Signed)
Office Visit Note  Patient: Tonya Hall             Date of Birth: 1950/10/03           MRN: [id number]             PCP: [name], MD Referring: [name], MD Visit Date: 12/17/2017 Occupation: @GUAROCC @  Subjective:  Right arm radiculopathy.   History of Present Illness: Tonya Hall is a 67 y.o. female with history of psoriatic arthritis, psoriasis, osteoarthritis and degenerative disc disease.  She states she has cervical spine fusion x2.  She continues to have right arm radiculopathy.  She also has some thoracic and lumbar spine disc disease which causes chronic discomfort.  He does not have any active psoriasis lesions.  She continues to have difficulty with psoriatic arthritis but this is the best she has done so far.  She is able to make a fist currently.  The left SI joint pain persists.  Activities of Daily Living:  Patient reports morning stiffness for all day hours.   Patient Reports nocturnal pain.  Thoracic pain Difficulty dressing/grooming: Denies Difficulty climbing stairs: Reports Difficulty getting out of chair: Reports Difficulty using hands for taps, buttons, cutlery, and/or writing: Reports  Review of Systems  Constitutional: Positive for fatigue. Negative for night sweats, weight gain and weight loss.  HENT: Positive for mouth dryness. Negative for mouth sores, trouble swallowing, trouble swallowing and nose dryness.   Eyes: Positive for dryness. Negative for pain, redness and visual disturbance.  Respiratory: Negative for cough, shortness of breath and difficulty breathing.   Cardiovascular: Negative for chest pain, palpitations, hypertension, irregular heartbeat and swelling in legs/feet.  Gastrointestinal: Negative for blood in stool, constipation and diarrhea.  Endocrine: Negative for increased urination.  Genitourinary: Negative for vaginal dryness.  Musculoskeletal: Positive for arthralgias, joint pain and morning stiffness. Negative for joint  swelling, myalgias, muscle weakness, muscle tenderness and myalgias.  Skin: Negative for color change, rash, hair loss, skin tightness, ulcers and sensitivity to sunlight.  Allergic/Immunologic: Negative for susceptible to infections.  Neurological: Negative for dizziness, memory loss, night sweats and weakness.  Hematological: Negative for swollen glands.  Psychiatric/Behavioral: Negative for depressed mood and sleep disturbance. The patient is not nervous/anxious.     PMFS History:  Patient Active Problem List   Diagnosis Date Noted  . Chest pain 11/14/2017  . Transaminitis 06/22/2016  . Collagenous colitis 05/23/2016  . Psoriasis 05/21/2016  . High risk medication use 02/15/2016  . Age-related osteoporosis without current pathological fracture 02/15/2016  . Trochanteric bursitis, left hip 02/15/2016  . Sacroiliitis, not elsewhere classified ([place]) 02/15/2016  . Chronic pain syndrome 02/15/2016  . Abnormal weight gain 06/25/2012  . Hypertension 07/03/2011  . Hypercholesteremia 07/03/2011  . Psoriatic arthritis ([place]) 07/03/2011  . Osteoarthritis of multiple joints 07/03/2011  . Esophageal motility disorder 02/14/2011  . Cough 02/14/2011  . GERD 05/05/2010    Past Medical History:  Diagnosis Date  . Anemia    at times  . Asthma    Albuterol in haler prn  . Cataract    immature unsure which eye  . Chronic back pain   . Degenerative disk disease    psoriatic  . Diabetes mellitus without complication (HCC)    diet controlled  . Esophageal motility disorder    Non-specific, see modified barium study/speech path, BP  . GERD (gastroesophageal reflux disease)    takes Protonix bid  . History of blood transfusion    did  have a rash after receiving the blood  . History of blood transfusion    post c-section  . History of bronchitis    last time early 2014  . HTN (hypertension)    takes Metoprolol daily and SPironolactone daily  . Hypercholesterolemia   . Hyperlipidemia      takes Pravastatin daily  . Hypokalemia   . Lymphocytic colitis 05/26/10 TCS RMR   Responded to Entocort x 3 MOS  . Monilia infection 12/93  . Neuropathy   . Osteoporosis    gets Boniva every 3 months  . Peripheral edema    takes HCTZ daily  . PONV (postoperative nausea and vomiting)   . Psoriatic arthritis ([place])    Dr. Katherina Right  . Raynaud's disease   . Seasonal allergies   . Shortness of breath    with exertion  . SUI (stress urinary incontinence, female)   . Urinary urgency     Family History  Problem Relation Age of Onset  . Diabetes Mother   . Pancreatitis Mother   . Arthritis Mother        psoriatic   . Fibromyalgia Mother   . Rheumatic fever Father   . Diabetes Unknown        grandparents  . Skin cancer Unknown        grandfather  . Hypertension Unknown        grandparent  . Congestive Heart Failure Unknown        grandfather  . Parkinson's disease Unknown        grandmother  . Colon cancer Maternal Uncle   . Fibromyalgia Sister   . Rheum arthritis Sister   . Diabetes Sister   . Fibromyalgia Sister   . Diabetes Sister   . Hypertension Son   . Colon polyps Neg Hx    Past Surgical History:  Procedure Laterality Date  . BIOPSY  04/17/2016   Procedure: BIOPSY;  Surgeon: Danie Binder, MD;  Location: [facility];  Service: Endoscopy;;  random colon bx's  . CERVICAL SPINE SURGERY  09/09/2017  . CESAREAN SECTION  1974, 1978      . COLONOSCOPY  06/19/2008   WUJ:WJXBJ internal hemorrhoids/mild sigmoin colon diverticulosis  . COLONOSCOPY  2012   Dr. Gala Romney: normal rectum, diverticula, lymphocytic colitis   . COLONOSCOPY WITH PROPOFOL N/A 04/17/2016   Procedure: COLONOSCOPY WITH PROPOFOL;  Surgeon: Danie Binder, MD;  Location: [facility];  Service: Endoscopy;  Laterality: N/A;  900  . DILATION AND CURETTAGE OF UTERUS  1977   spontaneous abortion  . ESOPHAGOGASTRODUODENOSCOPY  06/19/2008   YNW:GNFAOZHY gastritis  . LUMBAR DISC ARTHROPLASTY  7/98  .  LUMBAR FUSION  6/03, 11/08, 11/14   L4-5 fusion, L2-3 fusion, L1-2  . TONSILLECTOMY AND ADENOIDECTOMY    . TUBAL LIGATION     Social History   Social History Narrative  . Not on file    Objective: Vital Signs: BP 139/79 (BP Location: Left Arm, Patient Position: Sitting, Cuff Size: Normal)   Pulse 86   Resp 12   Ht 4\' 11"  (1.499 m)   Wt 140 lb (63.5 kg)   LMP 02/01/1999   BMI 28.28 kg/m    Physical Exam  Constitutional: She is oriented to person, place, and time. She appears well-developed and well-nourished.  HENT:  Head: Normocephalic and atraumatic.  Eyes: Conjunctivae and EOM are normal.  Neck: Normal range of motion.  Cardiovascular: Normal rate, regular rhythm, normal heart sounds and intact distal pulses.  Pulmonary/Chest: Effort normal and breath sounds normal.  Abdominal: Soft. Bowel sounds are normal.  Lymphadenopathy:    She has no cervical adenopathy.  Neurological: She is alert and oriented to person, place, and time.  Skin: Skin is warm and dry. Capillary refill takes less than 2 seconds.  Psychiatric: She has a normal mood and affect. Her behavior is normal.  Nursing note and vitals reviewed.    Musculoskeletal Exam: He has limited range of motion of the thoracic, lumbar and cervical spine.  She has some left SI joint tenderness.  Shoulder joints elbow joints were in good range of motion.  She has DIP and PIP thickening.  This was mild inflammation in her right third PIP joint.  She has subluxation of all PIP and DIP joints.  Hip joints knee joints ankles were in good range of motion.  She has some DIP PIP changes in her feet without any synovitis.  CDAI Exam: CDAI Score: 2  Patient Global Assessment: 5 (mm); Provider Global Assessment: 5 (mm) Swollen: 1 ; Tender: 0  Joint Exam      Right  Left  PIP 3  Swollen         Investigation: No additional findings.  Imaging: Mr Cervical Spine Wo Contrast  Result Date: 11/22/2017 CLINICAL DATA:  Right arm  pain which started after cervical laminectomy 09/09/2017. Cervical spondylolisthesis. EXAM: MRI CERVICAL AND THORACIC SPINE WITHOUT CONTRAST TECHNIQUE: Multiplanar and multiecho pulse sequences of the cervical and thoracic were obtained without intravenous contrast. COMPARISON:  07/15/2017 cervical MRI FINDINGS: MRI CERVICAL SPINE FINDINGS Alignment: Mild C6-7 retrolisthesis and C7-T1 anterolisthesis. Vertebrae: C3-4 and C4-5 ACDF. No fracture, discitis, or aggressive bone lesion Cord: Normal signal and morphology Posterior Fossa, vertebral arteries, paraspinal tissues: Negative Disc levels: C1-2: Left more than right facet effusion. There is no underlying erosive change or marrow edema and this finding was also seen on prior. C2-3: Asymmetric right facet and uncovertebral spurring with moderate right foraminal narrowing. C3-4: ACDF. There is improved canal and foraminal patency with resolved cord compression. C4-5: ACDF. Improved canal and foraminal patency with resolved cord mass effect. C5-6: Asymmetric left bulky facet spurring. Disc narrowing and desiccation. Patent canal and foramina C6-7: Disc narrowing with circumferential bulging. Disc contacts the ventral cord without compression. There is advanced right foraminal stenosis. The left foramen is patent C7-T1:Facet arthropathy with advanced spurring on the right. Moderate right foraminal narrowing. Patent canal and left foramen. MRI THORACIC SPINE FINDINGS Alignment:  Exaggerated thoracic kyphosis. Vertebrae: No fracture, evidence of discitis, or bone lesion. Cord:  Normal signal and morphology. Paraspinal and other soft tissues: Negative. Disc levels: Generalized disc narrowing and desiccation. There is ligamentum flavum thickening at the upper thoracic levels. Small disc herniation seen at T8-9 and T9-10 without neural contact. T10-11 advanced disc disease with disc space collapse and large extrusion extending superiorly, greater towards the right. There is  also focal advanced facet degeneration and spurring at this level with ligamentum flavum thickening. Herniation contacts the ventral cord without compression. There is biforaminal impingement. IMPRESSION: Cervical spine: 1. Significantly improved canal and foraminal patency at C3-4 and C4-5 after ACDF. 2. Stable disease elsewhere. 3. C2-3 moderate right foraminal narrowing 4. C6-7 high-grade right foraminal impingement. 5. C7-T1 moderate right foraminal narrowing. 6. Left more than right C1-2 facet effusions without erosion or marrow edema, stable. Thoracic spine: 1. T10-11 large disc extrusion with superior migration. There is cord contact without cord compression. Facet degeneration is also notable at this level and there  is biforaminal impingement. 2. Noncompressive degenerative changes elsewhere are described above. Electronically Signed   By: Monte Fantasia M.D.   On: 11/22/2017 16:23   Mr Thoracic Spine Wo Contrast  Result Date: 11/22/2017 CLINICAL DATA:  Right arm pain which started after cervical laminectomy 09/09/2017. Cervical spondylolisthesis. EXAM: MRI CERVICAL AND THORACIC SPINE WITHOUT CONTRAST TECHNIQUE: Multiplanar and multiecho pulse sequences of the cervical and thoracic were obtained without intravenous contrast. COMPARISON:  07/15/2017 cervical MRI FINDINGS: MRI CERVICAL SPINE FINDINGS Alignment: Mild C6-7 retrolisthesis and C7-T1 anterolisthesis. Vertebrae: C3-4 and C4-5 ACDF. No fracture, discitis, or aggressive bone lesion Cord: Normal signal and morphology Posterior Fossa, vertebral arteries, paraspinal tissues: Negative Disc levels: C1-2: Left more than right facet effusion. There is no underlying erosive change or marrow edema and this finding was also seen on prior. C2-3: Asymmetric right facet and uncovertebral spurring with moderate right foraminal narrowing. C3-4: ACDF. There is improved canal and foraminal patency with resolved cord compression. C4-5: ACDF. Improved canal and  foraminal patency with resolved cord mass effect. C5-6: Asymmetric left bulky facet spurring. Disc narrowing and desiccation. Patent canal and foramina C6-7: Disc narrowing with circumferential bulging. Disc contacts the ventral cord without compression. There is advanced right foraminal stenosis. The left foramen is patent C7-T1:Facet arthropathy with advanced spurring on the right. Moderate right foraminal narrowing. Patent canal and left foramen. MRI THORACIC SPINE FINDINGS Alignment:  Exaggerated thoracic kyphosis. Vertebrae: No fracture, evidence of discitis, or bone lesion. Cord:  Normal signal and morphology. Paraspinal and other soft tissues: Negative. Disc levels: Generalized disc narrowing and desiccation. There is ligamentum flavum thickening at the upper thoracic levels. Small disc herniation seen at T8-9 and T9-10 without neural contact. T10-11 advanced disc disease with disc space collapse and large extrusion extending superiorly, greater towards the right. There is also focal advanced facet degeneration and spurring at this level with ligamentum flavum thickening. Herniation contacts the ventral cord without compression. There is biforaminal impingement. IMPRESSION: Cervical spine: 1. Significantly improved canal and foraminal patency at C3-4 and C4-5 after ACDF. 2. Stable disease elsewhere. 3. C2-3 moderate right foraminal narrowing 4. C6-7 high-grade right foraminal impingement. 5. C7-T1 moderate right foraminal narrowing. 6. Left more than right C1-2 facet effusions without erosion or marrow edema, stable. Thoracic spine: 1. T10-11 large disc extrusion with superior migration. There is cord contact without cord compression. Facet degeneration is also notable at this level and there is biforaminal impingement. 2. Noncompressive degenerative changes elsewhere are described above. Electronically Signed   By: Monte Fantasia M.D.   On: 11/22/2017 16:23    Recent Labs: Lab Results  Component Value  Date   WBC 11.1 (H) 10/08/2017   HGB 12.6 10/08/2017   PLT 240 10/08/2017   NA 140 11/14/2017   K 3.6 11/14/2017   CL 107 11/14/2017   CO2 24 11/14/2017   GLUCOSE 133 11/14/2017   BUN 21 11/14/2017   CREATININE 0.92 11/14/2017   BILITOT 0.3 11/14/2017   ALKPHOS 49 11/20/2016   AST 28 11/14/2017   ALT 43 (H) 11/14/2017   PROT 6.5 11/14/2017   ALBUMIN 4.2 11/20/2016   CALCIUM 9.9 11/14/2017   GFRAA 75 11/14/2017   QFTBGOLD Negative 05/05/2015   QFTBGOLDPLUS NEGATIVE 07/09/2017    Speciality Comments: No specialty comments available.  Procedures:  No procedures performed Allergies: Adalimumab; Imuran [azathioprine sodium]; Methotrexate; Plaquenil [hydroxychloroquine sulfate]; Sulfonamide derivatives; Ceftin [cefuroxime axetil]; and Morphine   Assessment / Plan:     Visit Diagnoses: Psoriatic arthritis (HCC)-is doing  quite well without much synovitis.  She has some synovial thickening over PIP and DIP joints in her hands and feet.  She has mild inflammation in her right third PIP joint.  She is doing very well on combination of Cosentyx and Arava.  She has been on prednisone 5 mg p.o. daily.  I think it is time for her to taper her prednisone.  She has been on prednisone for many years she probably has adrenal insufficiency.  She will discuss tapering prednisone with Dr. Chalmers Cater.  Psoriasis-he has no rash on examination.  High risk medication use - Cosentyx 150 mg subcutaneous inj every 14 days, Arava 20 mg, Prednisone 5 mg TB gold: 07/09/17.  Her labs have been stable.  We will continue to monitor labs every 3 months.  Her LFTs are mildly elevated.  Chronic left SI joint pain-she has mild discomfort in her left SI joint.  Primary osteoarthritis involving multiple joints -she continues to have some discomfort.  DDD cervical status post fusion x2.  She is still having radiculopathy to the right arm.  DDD thoracic-she is having chronic discomfort.  Age-related osteoporosis without  current pathological fracture-she is on IV Reclast per Dr. Chalmers Cater.  Trochanteric bursitis, left hip-she has minimal discomfort currently.  Raynaud's syndrome without gangrene-currently not active.  Essential hypertension-blood pressure is mildly elevated.  Have advised her to monitor blood pressure closely.  Esophageal motility disorder  History of gastroesophageal reflux (GERD)  Collagenous colitis  History of type 2 diabetes mellitus   Orders: No orders of the defined types were placed in this encounter.  No orders of the defined types were placed in this encounter.   Face-to-face time spent with patient was 30 minutes. Greater than 50% of time was spent in counseling and coordination of care.  Follow-Up Instructions: Return in about 5 months (around 05/19/2018) for Psoriatic arthritis, Osteoarthritis.   Bo Merino, MD  Note - This record has been created using Editor, commissioning.  Chart creation errors have been sought, but may not always  have been located. Such creation errors do not reflect on  the standard of medical care.

## 2017-12-17 ENCOUNTER — Encounter: Payer: Self-pay | Admitting: Physician Assistant

## 2017-12-17 ENCOUNTER — Ambulatory Visit: Payer: PPO | Admitting: Rheumatology

## 2017-12-17 VITALS — BP 139/79 | HR 86 | Resp 12 | Ht 59.0 in | Wt 140.0 lb

## 2017-12-17 DIAGNOSIS — M5030 Other cervical disc degeneration, unspecified cervical region: Secondary | ICD-10-CM | POA: Diagnosis not present

## 2017-12-17 DIAGNOSIS — Z79899 Other long term (current) drug therapy: Secondary | ICD-10-CM

## 2017-12-17 DIAGNOSIS — L409 Psoriasis, unspecified: Secondary | ICD-10-CM

## 2017-12-17 DIAGNOSIS — K224 Dyskinesia of esophagus: Secondary | ICD-10-CM | POA: Diagnosis not present

## 2017-12-17 DIAGNOSIS — M810 Age-related osteoporosis without current pathological fracture: Secondary | ICD-10-CM

## 2017-12-17 DIAGNOSIS — G8929 Other chronic pain: Secondary | ICD-10-CM

## 2017-12-17 DIAGNOSIS — K52831 Collagenous colitis: Secondary | ICD-10-CM

## 2017-12-17 DIAGNOSIS — I10 Essential (primary) hypertension: Secondary | ICD-10-CM | POA: Diagnosis not present

## 2017-12-17 DIAGNOSIS — L4050 Arthropathic psoriasis, unspecified: Secondary | ICD-10-CM | POA: Diagnosis not present

## 2017-12-17 DIAGNOSIS — Z8719 Personal history of other diseases of the digestive system: Secondary | ICD-10-CM

## 2017-12-17 DIAGNOSIS — M5134 Other intervertebral disc degeneration, thoracic region: Secondary | ICD-10-CM | POA: Diagnosis not present

## 2017-12-17 DIAGNOSIS — M150 Primary generalized (osteo)arthritis: Secondary | ICD-10-CM | POA: Diagnosis not present

## 2017-12-17 DIAGNOSIS — M159 Polyosteoarthritis, unspecified: Secondary | ICD-10-CM

## 2017-12-17 DIAGNOSIS — I7300 Raynaud's syndrome without gangrene: Secondary | ICD-10-CM | POA: Diagnosis not present

## 2017-12-17 DIAGNOSIS — Z8639 Personal history of other endocrine, nutritional and metabolic disease: Secondary | ICD-10-CM

## 2017-12-17 DIAGNOSIS — M7062 Trochanteric bursitis, left hip: Secondary | ICD-10-CM

## 2017-12-17 DIAGNOSIS — M533 Sacrococcygeal disorders, not elsewhere classified: Secondary | ICD-10-CM

## 2017-12-17 DIAGNOSIS — M8949 Other hypertrophic osteoarthropathy, multiple sites: Secondary | ICD-10-CM

## 2017-12-17 NOTE — Patient Instructions (Signed)
Standing Labs We placed an order today for your standing lab work.    Please come back and get your standing labs in November and every 3 months   We have open lab Monday through Friday from 8:30-11:30 AM and 1:30-4:00 PM  at the office of Dr. Katlynn Naser.   You may experience shorter wait times on Monday and Friday afternoons. The office is located at [address], [address], [place], [place] [address] No appointment is necessary.   Labs are drawn by [facility].  You may receive a bill from [facility] for your lab work. If you have any questions regarding directions or hours of operation,  please call [phone].     

## 2017-12-19 ENCOUNTER — Telehealth: Payer: Self-pay | Admitting: Rheumatology

## 2017-12-19 ENCOUNTER — Telehealth: Payer: Self-pay | Admitting: Physician Assistant

## 2017-12-19 MED ORDER — HYDROCODONE-ACETAMINOPHEN 5-325 MG PO TABS: ORAL_TABLET | ORAL | 0 refills | Status: DC

## 2017-12-19 NOTE — Telephone Encounter (Signed)
The patient came by today to update narcotic agreement.  I spoke to the patient about the notification that we received from [employer] regarding safety alerts for the following medications Hydrocodone-acetaminophen and  Nortriptyline. We discussed trying to cut back on the use of Hydrocodone if possible.  Discussed the risks of opioid use over the age of 26. She was advised to schedule an appointment with her PCP to discuss other safer medication options to replace the nortriptyline.  She has an appointment coming up soon with her PCP and will address it at that time.

## 2017-12-19 NOTE — Telephone Encounter (Signed)
Pt came by to update narcotic agreement today.

## 2017-12-19 NOTE — Telephone Encounter (Signed)
Last Visit: 12/17/17 Next Visit: 05/20/18 UDS: 09/03/17 Narc Agreement: 12/20/16  Last Fill: 11/12/17  Okay to refill Hydrocodone?

## 2017-12-19 NOTE — Telephone Encounter (Signed)
Patient called requesting prescription refill of Hydrocodone to be sent to [facility] in [place]. 

## 2017-12-26 DIAGNOSIS — E785 Hyperlipidemia, unspecified: Secondary | ICD-10-CM | POA: Diagnosis not present

## 2018-01-01 DIAGNOSIS — E119 Type 2 diabetes mellitus without complications: Secondary | ICD-10-CM | POA: Diagnosis not present

## 2018-01-01 DIAGNOSIS — E559 Vitamin D deficiency, unspecified: Secondary | ICD-10-CM | POA: Diagnosis not present

## 2018-01-02 ENCOUNTER — Ambulatory Visit (INDEPENDENT_AMBULATORY_CARE_PROVIDER_SITE_OTHER): Payer: PPO | Admitting: Obstetrics & Gynecology

## 2018-01-02 ENCOUNTER — Ambulatory Visit: Payer: PPO | Admitting: Obstetrics & Gynecology

## 2018-01-02 ENCOUNTER — Other Ambulatory Visit (HOSPITAL_COMMUNITY)
Admission: RE | Admit: 2018-01-02 | Discharge: 2018-01-02 | Disposition: A | Discharge status: Home / Self Care | Payer: PPO | Source: Ambulatory Visit | Attending: Obstetrics & Gynecology | Admitting: Obstetrics & Gynecology

## 2018-01-02 ENCOUNTER — Encounter: Payer: Self-pay | Admitting: Obstetrics & Gynecology

## 2018-01-02 VITALS — BP 132/64 | HR 96 | Resp 18 | Ht 58.5 in | Wt 139.8 lb

## 2018-01-02 DIAGNOSIS — E785 Hyperlipidemia, unspecified: Secondary | ICD-10-CM | POA: Diagnosis not present

## 2018-01-02 DIAGNOSIS — Z01411 Encounter for gynecological examination (general) (routine) with abnormal findings: Secondary | ICD-10-CM | POA: Diagnosis not present

## 2018-01-02 DIAGNOSIS — Z124 Encounter for screening for malignant neoplasm of cervix: Secondary | ICD-10-CM | POA: Insufficient documentation

## 2018-01-02 DIAGNOSIS — I10 Essential (primary) hypertension: Secondary | ICD-10-CM | POA: Diagnosis not present

## 2018-01-02 DIAGNOSIS — R05 Cough: Secondary | ICD-10-CM | POA: Diagnosis not present

## 2018-01-02 DIAGNOSIS — Z23 Encounter for immunization: Secondary | ICD-10-CM | POA: Diagnosis not present

## 2018-01-02 NOTE — Progress Notes (Signed)
67 y.o. Tonya Hall Married White or Caucasian female here for annual exam.  Had bronchitis this summer.  Had CT scan that confirmed this.  Showed bulging disc.  MRI of cervical and thoracic spine.  Has followed up with Arnoldo Morale.  They have discussed surgery but she doesn't want to proceed with this right now.  Was on antibiotics and steroids this summer with the bronchitis.    Started having new bronchitis symptoms yesterday.  Was seen this morning and has been given and antibiotic and steroid dose pack.    Has recently seen Dr. Chalmers Cater.  They decided together to repeat the BMD due to steroids and issues with fractures ribs on the left.    PCP:  Dr. Willey Blade.  Is seen about every four months.  Patient's last menstrual period was 02/01/1999.          Sexually active: No.  The current method of family planning is post menopausal status.    Exercising: No.   Smoker:  no  Health Maintenance: Pap:  09/10/16 Neg   08/12/15 Neg  History of abnormal Pap:  no MMG:  08/14/17 BIRADS1:neg  Colonoscopy:  04/17/16 Normal. F/u 10 years  BMD:   2017. Has appt next week  TDaP:  2018 Pneumonia vaccine(s):  2014 Shingrix:   No Hep C testing: done Screening Labs: PCP   reports that she has never smoked. She has never used smokeless tobacco. She reports that she does not drink alcohol or use drugs.  Past Medical History:  Diagnosis Date  . Anemia    at times  . Asthma    Albuterol in haler prn  . Cataract    immature unsure which eye  . Chronic back pain   . Degenerative disk disease    psoriatic  . Diabetes mellitus without complication (HCC)    diet controlled  . Esophageal motility disorder    Non-specific, see modified barium study/speech path, BP  . GERD (gastroesophageal reflux disease)    takes Protonix bid  . History of blood transfusion    did have a rash after receiving the blood  . History of blood transfusion    post c-section  . History of bronchitis    last time early 2014  . HTN  (hypertension)    takes Metoprolol daily and SPironolactone daily  . Hypercholesterolemia   . Hyperlipidemia    takes Pravastatin daily  . Hypokalemia   . Lymphocytic colitis 05/26/10 TCS RMR   Responded to Entocort x 3 MOS  . Monilia infection 12/93  . Neuropathy   . Osteoporosis    gets Boniva every 3 months  . Peripheral edema    takes HCTZ daily  . PONV (postoperative nausea and vomiting)   . Psoriatic arthritis ([place])    Dr. Katherina Right  . Raynaud's disease   . Seasonal allergies   . Shortness of breath    with exertion  . SUI (stress urinary incontinence, female)   . Urinary urgency     Past Surgical History:  Procedure Laterality Date  . BIOPSY  04/17/2016   Procedure: BIOPSY;  Surgeon: Danie Binder, MD;  Location: [facility];  Service: Endoscopy;;  random colon bx's  . CERVICAL SPINE SURGERY  09/09/2017  . CESAREAN SECTION  1974, 1978      . COLONOSCOPY  06/19/2008   AYT:[id number] internal hemorrhoids/mild sigmoin colon diverticulosis  . COLONOSCOPY  2012   Dr. Gala Romney: normal rectum, diverticula, lymphocytic colitis   . COLONOSCOPY WITH PROPOFOL N/A  04/17/2016   Procedure: COLONOSCOPY WITH PROPOFOL;  Surgeon: Danie Binder, MD;  Location: [facility];  Service: Endoscopy;  Laterality: N/A;  900  . DILATION AND CURETTAGE OF UTERUS  1977   spontaneous abortion  . ESOPHAGOGASTRODUODENOSCOPY  06/19/2008   EXH:[id number] gastritis  . LUMBAR DISC ARTHROPLASTY  7/98  . LUMBAR FUSION  6/03, 11/08, 11/14   L4-5 fusion, L2-3 fusion, L1-2  . TONSILLECTOMY AND ADENOIDECTOMY    . TUBAL LIGATION      Current Outpatient Medications  Medication Sig Dispense Refill  . albuterol (PROVENTIL HFA;VENTOLIN HFA) 108 (90 BASE) MCG/ACT inhaler Inhale 2 puffs into the lungs every 6 (six) hours as needed for wheezing or shortness of breath.     Marland Kitchen aspirin 81 MG tablet Take 81 mg by mouth daily.      . Calcium Carb-Cholecalciferol (CALCIUM 600 + D PO) Take 1 tablet by mouth 2 (two) times  daily.    . cetirizine (ZYRTEC) 10 MG tablet Take 10 mg by mouth daily as needed for allergies.    Hillary Bow SENSOREADY 300 DOSE 150 MG/ML SOAJ Inject 2 pens into the skin every 30 (thirty) days. Reported on 08/12/2015 (Patient taking differently: Inject 2 pens into the skin every 30 (thirty) days. Reported on 08/12/2015) 6 pen 0  . cyclobenzaprine (FLEXERIL) 10 MG tablet Take 10 mg by mouth as needed for muscle spasms.    . diclofenac sodium (VOLTAREN) 1 % GEL Apply 2 g topically 3 (three) times daily as needed (pain).   2  . ezetimibe (ZETIA) 10 MG tablet Take 10 mg by mouth daily.    . fish oil-omega-3 fatty acids 1000 MG capsule Take 2 g by mouth daily.     . Ginger, Zingiber officinalis, (GINGER ROOT) 500 MG CAPS Take 1 capsule by mouth daily.     . Glucosamine Sulfate 1000 MG CAPS Take 2 capsules (2,000 mg total) by mouth daily.    Marland Kitchen guaiFENesin-dextromethorphan (ROBITUSSIN DM) 100-10 MG/5ML syrup Take 10 mLs by mouth every 6 (six) hours as needed for cough.    Marland Kitchen HUMULIN N KWIKPEN 100 UNIT/ML Kiwkpen Inject 10 Units into the skin at bedtime.    Marland Kitchen HYDROcodone-acetaminophen (NORCO/VICODIN) 5-325 MG tablet Take 1 tablet by mouth 2-3 times daily as needed for pain 90 tablet 0  . hydrocortisone sodium succinate (SOLU-CORTEF) 100 MG SOLR injection Inject 100 mg into the muscle. Take as needed when unable to swallow prednisone tablets     . leflunomide (ARAVA) 20 MG tablet TAKE 1 TABLET BY MOUTH ONCE DAILY. 90 tablet 0  . lidocaine (XYLOCAINE) 2 % solution 2 TSP  PO qac/hs PRN FOR upper ABDOMINAL OR CHEST PAIN. MAY REPEAT DOSE EVERY 4 HOURS. NO MORE THAN 8 DOSES A DAY. 300 mL 1  . losartan (COZAAR) 50 MG tablet Take 100 mg by mouth daily.     . metoprolol succinate (TOPROL-XL) 50 MG 24 hr tablet Take 1 tablet (50 mg total) by mouth daily. Take with or immediately following a meal.    . Misc Natural Products (TART CHERRY ADVANCED PO) Take 1 tablet by mouth daily. daily     . Multiple Vitamin  (MULTIVITAMIN) tablet Take 1 tablet by mouth daily.      . nortriptyline (PAMELOR) 25 MG capsule TAKE (1) CAPSULE BY MOUTH AT BEDTIME. 30 capsule 0  . omeprazole (PRILOSEC) 20 MG capsule TAKE ONE CAPSULE BY MOUTH TWICE DAILY. 60 capsule 11  . potassium chloride SA (K-DUR,KLOR-CON) 20 MEQ tablet  Take 20 mEq by mouth daily.     . predniSONE (DELTASONE) 5 MG tablet TAKE 1 TABLET BY MOUTH EVERY MORNING. 30 tablet 0  . PROBIOTIC CAPS Take 1 capsule by mouth daily.    . RESTASIS MULTIDOSE 0.05 % ophthalmic emulsion Place 1 drop into both eyes daily.     Marland Kitchen spironolactone (ALDACTONE) 25 MG tablet Take 25 mg by mouth daily.     . SURE COMFORT PEN NEEDLES 31G X 5 MM MISC     . tiZANidine (ZANAFLEX) 4 MG tablet TAKE (1) TABLET BY MOUTH AT BEDTIME. (Patient taking differently: TAKE (1) TABLET BY MOUTH AT BEDTIME. AS NEEDED) 90 tablet 0  . topiramate (TOPAMAX) 200 MG tablet TAKE (1) TABLET BY MOUTH AT BEDTIME. 90 tablet 0  . Turmeric 500 MG CAPS Take 1 capsule by mouth daily.     . Zoledronic Acid (RECLAST IV) Inject into the vein.      No current facility-administered medications for this visit.     Family History  Problem Relation Age of Onset  . Diabetes Mother   . Pancreatitis Mother   . Arthritis Mother        psoriatic   . Fibromyalgia Mother   . Rheumatic fever Father   . Diabetes Unknown        grandparents  . Skin cancer Unknown        grandfather  . Hypertension Unknown        grandparent  . Congestive Heart Failure Unknown        grandfather  . Parkinson's disease Unknown        grandmother  . Colon cancer Maternal Uncle   . Fibromyalgia Sister   . Rheum arthritis Sister   . Diabetes Sister   . Fibromyalgia Sister   . Diabetes Sister   . Hypertension Son   . Colon polyps Neg Hx     Review of Systems  Genitourinary:       Loss of urine with sneeze or cough  Night urination   Musculoskeletal: Positive for arthralgias and joint swelling.  Neurological:       Lack of  coordination   Hematological: Bruises/bleeds easily.  All other systems reviewed and are negative.   Exam:   BP 132/64 (BP Location: Right Arm, Patient Position: Sitting, Cuff Size: Normal)   Pulse 96   Resp 18   Ht 4' 10.5" (1.486 m)   Wt 139 lb 12.8 oz (63.4 kg)   LMP 02/01/1999   BMI 28.72 kg/m    Height: 4' 10.5" (148.6 cm)  Ht Readings from Last 3 Encounters:  01/02/18 4' 10.5" (1.486 m)  12/17/17 4\' 11"  (1.499 m)  11/14/17 4\' 11"  (1.499 m)    General appearance: alert, cooperative and appears stated age Head: Normocephalic, without obvious abnormality, atraumatic Neck: no adenopathy, supple, symmetrical, trachea midline and thyroid normal to inspection and palpation Lungs: clear to auscultation bilaterally Breasts: normal appearance, no masses or tenderness Heart: regular rate and rhythm Abdomen: soft, non-tender; bowel sounds normal; no masses,  no organomegaly Extremities: extremities normal, atraumatic, no cyanosis or edema Skin: Skin color, texture, turgor normal. No rashes or lesions Lymph nodes: Cervical, supraclavicular, and axillary nodes normal. No abnormal inguinal nodes palpated Neurologic: Grossly normal   Pelvic: External genitalia:  no lesions              Urethra:  normal appearing urethra with no masses, tenderness or lesions  Bartholins and Skenes: normal                 Vagina: normal appearing vagina with normal color and discharge, no lesions              Cervix: no lesions              Pap taken: Yes.   Bimanual Exam:  Uterus:  normal size, contour, position, consistency, mobility, non-tender              Adnexa: normal adnexa and no mass, fullness, tenderness               Rectovaginal: Confirms               Anus:  normal sphincter tone, no lesions  Chaperone was present for exam.  A:  Well Woman with normal exam PMP, no HRT Current bronchitis Psoriatic arthritis  On chronic immunosuppression  H/o elevated lipids and  elevated triglycerides Diabetes, followed by Dr. Chalmers Cater H/O microscopic colitis Osteoporosis, on Reclast.  Dose again due in December.  P:   Mammogram guidelines reviewed.  Doing yearly MMGs. pap smear obtained due to chronic immunosuppression D/w pt shingrix vaccination.  She is desirous of starting this. BMD planned for next week return annually or prn

## 2018-01-06 ENCOUNTER — Other Ambulatory Visit: Payer: Self-pay | Admitting: *Deleted

## 2018-01-06 LAB — CYTOLOGY - PAP: Diagnosis: NEGATIVE

## 2018-01-06 MED ORDER — PREDNISONE 5 MG PO TABS: 5.0000 mg | ORAL_TABLET | Freq: Every morning | ORAL | 0 refills | Status: DC

## 2018-01-06 NOTE — Telephone Encounter (Signed)
Refill request received via fax  Last Visit: 12/17/17 Next Visit: 05/20/18   Okay to refill per Dr. Estanislado Pandy

## 2018-01-15 ENCOUNTER — Encounter: Payer: Self-pay | Admitting: Gastroenterology

## 2018-01-16 DIAGNOSIS — M810 Age-related osteoporosis without current pathological fracture: Secondary | ICD-10-CM | POA: Diagnosis not present

## 2018-01-16 DIAGNOSIS — E7800 Pure hypercholesterolemia, unspecified: Secondary | ICD-10-CM | POA: Diagnosis not present

## 2018-01-16 DIAGNOSIS — E2749 Other adrenocortical insufficiency: Secondary | ICD-10-CM | POA: Diagnosis not present

## 2018-01-16 DIAGNOSIS — E559 Vitamin D deficiency, unspecified: Secondary | ICD-10-CM | POA: Diagnosis not present

## 2018-01-16 DIAGNOSIS — Z13820 Encounter for screening for osteoporosis: Secondary | ICD-10-CM | POA: Diagnosis not present

## 2018-01-16 DIAGNOSIS — E119 Type 2 diabetes mellitus without complications: Secondary | ICD-10-CM | POA: Diagnosis not present

## 2018-01-16 DIAGNOSIS — I10 Essential (primary) hypertension: Secondary | ICD-10-CM | POA: Diagnosis not present

## 2018-01-20 DIAGNOSIS — E2749 Other adrenocortical insufficiency: Secondary | ICD-10-CM | POA: Diagnosis not present

## 2018-01-24 ENCOUNTER — Other Ambulatory Visit: Payer: Self-pay

## 2018-01-24 ENCOUNTER — Emergency Department (HOSPITAL_COMMUNITY): Payer: PPO

## 2018-01-24 ENCOUNTER — Encounter (HOSPITAL_COMMUNITY): Payer: Self-pay

## 2018-01-24 ENCOUNTER — Inpatient Hospital Stay (HOSPITAL_COMMUNITY)
Admission: EM | Admit: 2018-01-24 | Discharge: 2018-01-29 | DRG: 517 | Disposition: A | Discharge status: Home / Self Care | Payer: PPO | Attending: Neurosurgery | Admitting: Neurosurgery

## 2018-01-24 DIAGNOSIS — I10 Essential (primary) hypertension: Secondary | ICD-10-CM | POA: Diagnosis not present

## 2018-01-24 DIAGNOSIS — E785 Hyperlipidemia, unspecified: Secondary | ICD-10-CM | POA: Diagnosis not present

## 2018-01-24 DIAGNOSIS — M810 Age-related osteoporosis without current pathological fracture: Secondary | ICD-10-CM | POA: Diagnosis not present

## 2018-01-24 DIAGNOSIS — M549 Dorsalgia, unspecified: Secondary | ICD-10-CM | POA: Diagnosis present

## 2018-01-24 DIAGNOSIS — R52 Pain, unspecified: Secondary | ICD-10-CM | POA: Diagnosis not present

## 2018-01-24 DIAGNOSIS — Z7982 Long term (current) use of aspirin: Secondary | ICD-10-CM

## 2018-01-24 DIAGNOSIS — Z419 Encounter for procedure for purposes other than remedying health state, unspecified: Secondary | ICD-10-CM

## 2018-01-24 DIAGNOSIS — S14152A Other incomplete lesion at C2 level of cervical spinal cord, initial encounter: Secondary | ICD-10-CM | POA: Diagnosis present

## 2018-01-24 DIAGNOSIS — S12100A Unspecified displaced fracture of second cervical vertebra, initial encounter for closed fracture: Secondary | ICD-10-CM

## 2018-01-24 DIAGNOSIS — G8929 Other chronic pain: Secondary | ICD-10-CM | POA: Diagnosis not present

## 2018-01-24 DIAGNOSIS — Z79899 Other long term (current) drug therapy: Secondary | ICD-10-CM

## 2018-01-24 DIAGNOSIS — J45909 Unspecified asthma, uncomplicated: Secondary | ICD-10-CM | POA: Diagnosis present

## 2018-01-24 DIAGNOSIS — S12110A Anterior displaced Type II dens fracture, initial encounter for closed fracture: Secondary | ICD-10-CM | POA: Diagnosis not present

## 2018-01-24 DIAGNOSIS — S12112A Nondisplaced Type II dens fracture, initial encounter for closed fracture: Principal | ICD-10-CM | POA: Diagnosis present

## 2018-01-24 DIAGNOSIS — Z881 Allergy status to other antibiotic agents status: Secondary | ICD-10-CM

## 2018-01-24 DIAGNOSIS — I7300 Raynaud's syndrome without gangrene: Secondary | ICD-10-CM | POA: Diagnosis not present

## 2018-01-24 DIAGNOSIS — Z794 Long term (current) use of insulin: Secondary | ICD-10-CM

## 2018-01-24 DIAGNOSIS — K224 Dyskinesia of esophagus: Secondary | ICD-10-CM | POA: Diagnosis present

## 2018-01-24 DIAGNOSIS — K219 Gastro-esophageal reflux disease without esophagitis: Secondary | ICD-10-CM | POA: Diagnosis present

## 2018-01-24 DIAGNOSIS — W19XXXA Unspecified fall, initial encounter: Secondary | ICD-10-CM | POA: Diagnosis not present

## 2018-01-24 DIAGNOSIS — Z888 Allergy status to other drugs, medicaments and biological substances status: Secondary | ICD-10-CM

## 2018-01-24 DIAGNOSIS — S0990XA Unspecified injury of head, initial encounter: Secondary | ICD-10-CM | POA: Diagnosis not present

## 2018-01-24 DIAGNOSIS — Y93K1 Activity, walking an animal: Secondary | ICD-10-CM

## 2018-01-24 DIAGNOSIS — Z882 Allergy status to sulfonamides status: Secondary | ICD-10-CM | POA: Diagnosis not present

## 2018-01-24 DIAGNOSIS — M542 Cervicalgia: Secondary | ICD-10-CM | POA: Diagnosis not present

## 2018-01-24 DIAGNOSIS — W1830XA Fall on same level, unspecified, initial encounter: Secondary | ICD-10-CM | POA: Diagnosis present

## 2018-01-24 DIAGNOSIS — R609 Edema, unspecified: Secondary | ICD-10-CM | POA: Diagnosis not present

## 2018-01-24 DIAGNOSIS — E1140 Type 2 diabetes mellitus with diabetic neuropathy, unspecified: Secondary | ICD-10-CM | POA: Diagnosis not present

## 2018-01-24 DIAGNOSIS — Z885 Allergy status to narcotic agent status: Secondary | ICD-10-CM | POA: Diagnosis not present

## 2018-01-24 DIAGNOSIS — Z7951 Long term (current) use of inhaled steroids: Secondary | ICD-10-CM

## 2018-01-24 DIAGNOSIS — S12111A Posterior displaced Type II dens fracture, initial encounter for closed fracture: Secondary | ICD-10-CM | POA: Diagnosis not present

## 2018-01-24 LAB — CBG MONITORING, ED: Glucose-Capillary: 168 mg/dL — ABNORMAL HIGH (ref 70–99)

## 2018-01-24 LAB — CBC WITH DIFFERENTIAL/PLATELET
Abs Immature Granulocytes: 0.08 10*3/uL — ABNORMAL HIGH (ref 0.00–0.07)
Basophils Absolute: 0.1 10*3/uL (ref 0.0–0.1)
Basophils Relative: 1 %
Eosinophils Absolute: 0.2 10*3/uL (ref 0.0–0.5)
Eosinophils Relative: 1 %
HCT: 38.7 % (ref 36.0–46.0)
Hemoglobin: 12.9 g/dL (ref 12.0–15.0)
Immature Granulocytes: 1 %
Lymphocytes Relative: 11 %
Lymphs Abs: 1.8 10*3/uL (ref 0.7–4.0)
MCH: 30.9 pg (ref 26.0–34.0)
MCHC: 33.3 g/dL (ref 30.0–36.0)
MCV: 92.8 fL (ref 80.0–100.0)
Monocytes Absolute: 1 10*3/uL (ref 0.1–1.0)
Monocytes Relative: 7 %
Neutro Abs: 12.4 10*3/uL — ABNORMAL HIGH (ref 1.7–7.7)
Neutrophils Relative %: 79 %
Platelets: 234 10*3/uL (ref 150–400)
RBC: 4.17 MIL/uL (ref 3.87–5.11)
RDW: 12.5 % (ref 11.5–15.5)
WBC: 15.6 10*3/uL — ABNORMAL HIGH (ref 4.0–10.5)
nRBC: 0 % (ref 0.0–0.2)

## 2018-01-24 LAB — BASIC METABOLIC PANEL
Anion gap: 15 (ref 5–15)
BUN: 13 mg/dL (ref 8–23)
CO2: 21 mmol/L — ABNORMAL LOW (ref 22–32)
Calcium: 9.4 mg/dL (ref 8.9–10.3)
Chloride: 96 mmol/L — ABNORMAL LOW (ref 98–111)
Creatinine, Ser: 0.68 mg/dL (ref 0.44–1.00)
GFR calc Af Amer: >60 mL/min (ref >60)
GFR calc non Af Amer: >60 mL/min (ref >60)
Glucose, Bld: 119 mg/dL — ABNORMAL HIGH (ref 70–99)
Potassium: 3.9 mmol/L (ref 3.5–5.1)
Sodium: 132 mmol/L — ABNORMAL LOW (ref 135–145)

## 2018-01-24 MED ORDER — PREDNISONE 5 MG PO TABS
5.0000 mg | ORAL_TABLET | Freq: Every day | ORAL | Status: DC
  Administered 2018-01-25 – 2018-01-29 (×4): 5 mg via ORAL
  Filled 2018-01-24 (×5): qty 1

## 2018-01-24 MED ORDER — TURMERIC 500 MG PO CAPS: 1.0000 | ORAL_CAPSULE | Freq: Every day | ORAL | Status: DC

## 2018-01-24 MED ORDER — RISAQUAD PO CAPS
1.0000 | ORAL_CAPSULE | Freq: Every day | ORAL | Status: DC
  Administered 2018-01-25 – 2018-01-29 (×4): 1 via ORAL
  Filled 2018-01-24 (×6): qty 1

## 2018-01-24 MED ORDER — LOSARTAN POTASSIUM 50 MG PO TABS
100.0000 mg | ORAL_TABLET | Freq: Every day | ORAL | Status: DC
  Administered 2018-01-25 – 2018-01-29 (×4): 100 mg via ORAL
  Filled 2018-01-24 (×5): qty 2

## 2018-01-24 MED ORDER — CYCLOSPORINE 0.05 % OP EMUL
1.0000 [drp] | Freq: Every day | OPHTHALMIC | Status: DC
  Administered 2018-01-25: 1 [drp] via OPHTHALMIC
  Filled 2018-01-24 (×2): qty 1

## 2018-01-24 MED ORDER — DICLOFENAC SODIUM 1 % TD GEL: 2.0000 g | Freq: Three times a day (TID) | TRANSDERMAL | Status: DC | PRN

## 2018-01-24 MED ORDER — SPIRONOLACTONE 25 MG PO TABS
25.0000 mg | ORAL_TABLET | Freq: Every day | ORAL | Status: DC
  Administered 2018-01-25 – 2018-01-29 (×4): 25 mg via ORAL
  Filled 2018-01-24 (×5): qty 1

## 2018-01-24 MED ORDER — LEFLUNOMIDE 20 MG PO TABS
20.0000 mg | ORAL_TABLET | Freq: Every day | ORAL | Status: DC
  Administered 2018-01-25 – 2018-01-28 (×4): 20 mg via ORAL
  Filled 2018-01-24 (×5): qty 1

## 2018-01-24 MED ORDER — METHOCARBAMOL 500 MG PO TABS
1000.0000 mg | ORAL_TABLET | Freq: Once | ORAL | Status: AC
  Administered 2018-01-24: 1000 mg via ORAL
  Filled 2018-01-24: qty 2

## 2018-01-24 MED ORDER — ASPIRIN EC 81 MG PO TBEC
81.0000 mg | DELAYED_RELEASE_TABLET | Freq: Every day | ORAL | Status: DC
  Administered 2018-01-25 – 2018-01-26 (×2): 81 mg via ORAL
  Filled 2018-01-24 (×3): qty 1

## 2018-01-24 MED ORDER — TIZANIDINE HCL 4 MG PO TABS
4.0000 mg | ORAL_TABLET | Freq: Three times a day (TID) | ORAL | Status: DC
  Administered 2018-01-24 – 2018-01-29 (×12): 4 mg via ORAL
  Filled 2018-01-24 (×15): qty 1

## 2018-01-24 MED ORDER — GINGER ROOT 500 MG PO CAPS: 1.0000 | ORAL_CAPSULE | Freq: Every day | ORAL | Status: DC

## 2018-01-24 MED ORDER — POTASSIUM CHLORIDE CRYS ER 20 MEQ PO TBCR
20.0000 meq | EXTENDED_RELEASE_TABLET | Freq: Every day | ORAL | Status: DC
  Administered 2018-01-24 – 2018-01-29 (×5): 20 meq via ORAL
  Filled 2018-01-24 (×6): qty 1

## 2018-01-24 MED ORDER — CYCLOBENZAPRINE HCL 10 MG PO TABS
10.0000 mg | ORAL_TABLET | ORAL | Status: DC | PRN
  Administered 2018-01-24 – 2018-01-25 (×2): 10 mg via ORAL
  Filled 2018-01-24 (×2): qty 1

## 2018-01-24 MED ORDER — TOPIRAMATE 100 MG PO TABS
200.0000 mg | ORAL_TABLET | Freq: Every day | ORAL | Status: DC
  Administered 2018-01-25 – 2018-01-28 (×4): 200 mg via ORAL
  Filled 2018-01-24: qty 2
  Filled 2018-01-24: qty 8
  Filled 2018-01-24: qty 2
  Filled 2018-01-24: qty 8
  Filled 2018-01-24 (×2): qty 2

## 2018-01-24 MED ORDER — OMEGA-3-ACID ETHYL ESTERS 1 G PO CAPS
2.0000 g | ORAL_CAPSULE | Freq: Every day | ORAL | Status: DC
  Administered 2018-01-25 – 2018-01-26 (×2): 2 g via ORAL
  Filled 2018-01-24 (×4): qty 2

## 2018-01-24 MED ORDER — ADULT MULTIVITAMIN W/MINERALS CH
1.0000 | ORAL_TABLET | Freq: Every day | ORAL | Status: DC
  Administered 2018-01-25 – 2018-01-29 (×4): 1 via ORAL
  Filled 2018-01-24 (×5): qty 1

## 2018-01-24 MED ORDER — CALCIUM CARBONATE-VITAMIN D 500-200 MG-UNIT PO TABS
1.0000 | ORAL_TABLET | Freq: Two times a day (BID) | ORAL | Status: DC
  Administered 2018-01-25 – 2018-01-29 (×7): 1 via ORAL
  Filled 2018-01-24 (×8): qty 1

## 2018-01-24 MED ORDER — GLUCOSAMINE SULFATE 1000 MG PO CAPS: 2000.0000 mg | ORAL_CAPSULE | Freq: Every day | ORAL | Status: DC

## 2018-01-24 MED ORDER — TART CHERRY ADVANCED PO CAPS: ORAL_CAPSULE | Freq: Every day | ORAL | Status: DC

## 2018-01-24 MED ORDER — DEXAMETHASONE SODIUM PHOSPHATE 10 MG/ML IJ SOLN
10.0000 mg | Freq: Four times a day (QID) | INTRAMUSCULAR | Status: DC
  Administered 2018-01-24 – 2018-01-27 (×11): 10 mg via INTRAVENOUS
  Filled 2018-01-24 (×11): qty 1

## 2018-01-24 MED ORDER — HYDROMORPHONE HCL 1 MG/ML IJ SOLN
0.7500 mg | Freq: Once | INTRAMUSCULAR | Status: AC
  Administered 2018-01-24: 0.75 mg via INTRAVENOUS
  Filled 2018-01-24: qty 1

## 2018-01-24 MED ORDER — PANTOPRAZOLE SODIUM 40 MG PO TBEC
80.0000 mg | DELAYED_RELEASE_TABLET | Freq: Every day | ORAL | Status: DC
  Administered 2018-01-24: 80 mg via ORAL
  Filled 2018-01-24 (×2): qty 2

## 2018-01-24 MED ORDER — BACITRACIN ZINC 500 UNIT/GM EX OINT
1.0000 "application " | TOPICAL_OINTMENT | Freq: Two times a day (BID) | CUTANEOUS | Status: DC
  Administered 2018-01-24 – 2018-01-29 (×10): 1 via TOPICAL
  Filled 2018-01-24: qty 0.9
  Filled 2018-01-24: qty 28.4
  Filled 2018-01-24: qty 0.9

## 2018-01-24 MED ORDER — ONDANSETRON HCL 4 MG/2ML IJ SOLN
4.0000 mg | Freq: Once | INTRAMUSCULAR | Status: AC
  Administered 2018-01-24: 4 mg via INTRAVENOUS
  Filled 2018-01-24: qty 2

## 2018-01-24 MED ORDER — METOPROLOL SUCCINATE ER 50 MG PO TB24
50.0000 mg | ORAL_TABLET | Freq: Every day | ORAL | Status: DC
  Administered 2018-01-25 – 2018-01-28 (×4): 50 mg via ORAL
  Filled 2018-01-24 (×2): qty 1
  Filled 2018-01-24: qty 2
  Filled 2018-01-24 (×2): qty 1

## 2018-01-24 MED ORDER — ALBUTEROL SULFATE HFA 108 (90 BASE) MCG/ACT IN AERS
2.0000 | INHALATION_SPRAY | Freq: Four times a day (QID) | RESPIRATORY_TRACT | Status: DC | PRN
  Filled 2018-01-24: qty 6.7

## 2018-01-24 MED ORDER — METHOCARBAMOL 500 MG PO TABS
500.0000 mg | ORAL_TABLET | Freq: Once | ORAL | Status: AC
  Administered 2018-01-24: 500 mg via ORAL
  Filled 2018-01-24: qty 1

## 2018-01-24 MED ORDER — NORTRIPTYLINE HCL 25 MG PO CAPS
25.0000 mg | ORAL_CAPSULE | Freq: Every day | ORAL | Status: DC
  Administered 2018-01-24 – 2018-01-28 (×5): 25 mg via ORAL
  Filled 2018-01-24 (×5): qty 1

## 2018-01-24 MED ORDER — EZETIMIBE 10 MG PO TABS
10.0000 mg | ORAL_TABLET | Freq: Every day | ORAL | Status: DC
  Administered 2018-01-26 – 2018-01-29 (×3): 10 mg via ORAL
  Filled 2018-01-24 (×5): qty 1

## 2018-01-24 MED ORDER — HYDROCODONE-ACETAMINOPHEN 5-325 MG PO TABS
1.0000 | ORAL_TABLET | ORAL | Status: DC | PRN
  Administered 2018-01-24 – 2018-01-29 (×11): 1 via ORAL
  Filled 2018-01-24 (×12): qty 1

## 2018-01-24 MED ORDER — INSULIN NPH (HUMAN) (ISOPHANE) 100 UNIT/ML ~~LOC~~ SUSP
10.0000 [IU] | Freq: Every day | SUBCUTANEOUS | Status: DC
  Administered 2018-01-25 – 2018-01-28 (×5): 10 [IU] via SUBCUTANEOUS
  Filled 2018-01-24 (×3): qty 10

## 2018-01-24 MED ORDER — GUAIFENESIN-DM 100-10 MG/5ML PO SYRP
10.0000 mL | ORAL_SOLUTION | Freq: Four times a day (QID) | ORAL | Status: DC | PRN
  Administered 2018-01-27 – 2018-01-29 (×4): 10 mL via ORAL
  Filled 2018-01-24 (×4): qty 10

## 2018-01-24 MED ORDER — LORATADINE 10 MG PO TABS
10.0000 mg | ORAL_TABLET | Freq: Every day | ORAL | Status: DC
  Administered 2018-01-25 – 2018-01-29 (×4): 10 mg via ORAL
  Filled 2018-01-24 (×6): qty 1

## 2018-01-24 NOTE — ED Provider Notes (Signed)
[facility] EM[facility] DEPARTMENT Provider Note   CSN: [id number] Arrival date & time: 01/24/18  1303     History   Chief Complaint Chief Complaint  Patient presents with  . Fall    HPI Tonya Hall is a 67 y.o. female.  HPI  67 year old female, she has a history of psoriatic arthritis, she has a history of prior cervical surgery by neurosurgery as well as some lumbar procedures in the past.  She presents today after having an accidental fall while she was walking a dog.  The dog started running, it pulled her to the ground, she landed on her face striking her nose on the grassy surface.  She did not lose consciousness but states that immediately upon falling she was unable to get up in fact she states she was unable to move either her arms or her legs.  Over a short period of time she started to regain some of the strength and sensation in the arms and the legs and was ultimately able to assist paramedics and rolling onto her back when they got there.  They placed her in a cervical immobilization and transported to the hospital.  The patient reports that her legs are back to normal, she is able to lift both of them, she has normal sensation, she feels weak in her arms however she states that after her cervical surgery she had some persistent neurologic symptoms.  She reports that the left arm seems weaker than the right arm though she reports that in the past the right arm is weaker than the left arm after surgery.  She also complains of some tingling to the bilateral upper chest right around with the cervical collar meets her chest.  She denies headache and has minimal neck pain.  He has no nosebleeding.  Past Medical History:  Diagnosis Date  . Asthma    Albuterol in haler prn  . Cataract    immature unsure which eye  . Chronic back pain   . Degenerative disk disease    psoriatic  . Diabetes mellitus without complication (HCC)    diet controlled  .  Esophageal motility disorder    Non-specific, see modified barium study/speech path, BP  . GERD (gastroesophageal reflux disease)    takes Protonix bid  . History of blood transfusion    post c-section  . History of bronchitis   . HTN (hypertension)   . Hyperlipidemia    takes Pravastatin daily  . Lymphocytic colitis 05/26/2010   Responded to Entocort x 3 MOS  . Neuropathy   . Osteoporosis    gets Boniva every 3 months  . Peripheral edema    takes HCTZ daily  . PONV (postoperative nausea and vomiting)   . Psoriatic arthritis ([place])    Dr. Katherina Right  . Raynaud's disease   . Seasonal allergies   . SUI (stress urinary incontinence, female)   . Urinary urgency     Patient Active Problem List   Diagnosis Date Noted  . Chest pain 11/14/2017  . Transaminitis 06/22/2016  . Collagenous colitis 05/23/2016  . Psoriasis 05/21/2016  . High risk medication use 02/15/2016  . Age-related osteoporosis without current pathological fracture 02/15/2016  . Trochanteric bursitis, left hip 02/15/2016  . Sacroiliitis, not elsewhere classified ([place]) 02/15/2016  . Chronic pain syndrome 02/15/2016  . Abnormal weight gain 06/25/2012  . Hypertension 07/03/2011  . Hypercholesteremia 07/03/2011  . Psoriatic arthritis ([place]) 07/03/2011  . Osteoarthritis of multiple joints 07/03/2011  .  Esophageal motility disorder 02/14/2011  . Cough 02/14/2011  . GERD 05/05/2010    Past Surgical History:  Procedure Laterality Date  . BIOPSY  04/17/2016   Procedure: BIOPSY;  Surgeon: Danie Binder, MD;  Location: [facility];  Service: Endoscopy;;  random colon bx's  . CERVICAL SPINE SURGERY  09/09/2017  . CESAREAN SECTION  1974, 1978      . COLONOSCOPY  06/19/2008   [id number] internal hemorrhoids/mild sigmoin colon diverticulosis  . COLONOSCOPY  2012   Dr. Gala Romney: normal rectum, diverticula, lymphocytic colitis   . COLONOSCOPY WITH PROPOFOL N/A 04/17/2016   Procedure: COLONOSCOPY WITH PROPOFOL;  Surgeon:  Danie Binder, MD;  Location: [facility];  Service: Endoscopy;  Laterality: N/A;  900  . DILATION AND CURETTAGE OF UTERUS  1977   spontaneous abortion  . ESOPHAGOGASTRODUODENOSCOPY  06/19/2008   TFT:[id number] gastritis  . LUMBAR DISC ARTHROPLASTY  7/98  . LUMBAR FUSION  6/03, 11/08, 11/14   L4-5 fusion, L2-3 fusion, L1-2  . TONSILLECTOMY AND ADENOIDECTOMY    . TUBAL LIGATION       OB History    Gravida  3   Para  2   Term  2   Preterm      AB  1   Living  2     SAB  0   TAB      Ectopic      Multiple      Live Births               Home Medications    Prior to Admission medications   Medication Sig Start Date End Date Taking? Authorizing Provider  albuterol (PROVENTIL HFA;VENTOLIN HFA) 108 (90 BASE) MCG/ACT inhaler Inhale 2 puffs into the lungs every 6 (six) hours as needed for wheezing or shortness of breath.     [provider]  aspirin 81 MG tablet Take 81 mg by mouth daily.      [provider]  Calcium Carb-Cholecalciferol (CALCIUM 600 + D PO) Take 1 tablet by mouth 2 (two) times daily.    [provider]  cetirizine (ZYRTEC) 10 MG tablet Take 10 mg by mouth daily as needed for allergies.    [provider]  COSENTYX SENSOREADY 300 DOSE 150 MG/ML SOAJ Inject 2 pens into the skin every 30 (thirty) days. Reported on 08/12/2015 Patient taking differently: Inject 2 pens into the skin every 30 (thirty) days. Reported on 08/12/2015 12/20/16   Bo Merino, MD  cyclobenzaprine (FLEXERIL) 10 MG tablet Take 10 mg by mouth as needed for muscle spasms.    [provider]  diclofenac sodium (VOLTAREN) 1 % GEL Apply 2 g topically 3 (three) times daily as needed (pain).  01/17/15   [provider]  ezetimibe (ZETIA) 10 MG tablet Take 10 mg by mouth daily.    [provider]  fish oil-omega-3 fatty acids 1000 MG capsule Take 2 g by mouth daily.     [provider]  Ginger, Zingiber officinalis,  (GINGER ROOT) 500 MG CAPS Take 1 capsule by mouth daily.     [provider]  Glucosamine Sulfate 1000 MG CAPS Take 2 capsules (2,000 mg total) by mouth daily. 07/03/11   Zenia Resides, MD  guaiFENesin-dextromethorphan (ROBITUSSIN DM) 100-10 MG/5ML syrup Take 10 mLs by mouth every 6 (six) hours as needed for cough.    [provider]  HUMULIN N KWIKPEN 100 UNIT/ML Kiwkpen Inject 10 Units into the skin at bedtime.  05/23/16   [provider]  HYDROcodone-acetaminophen (NORCO/VICODIN) 5-325 MG tablet Take 1 tablet by mouth 2-3 times daily as needed for pain 12/19/17   Ofilia Neas, PA-C  hydrocortisone sodium succinate (SOLU-CORTEF) 100 MG SOLR injection Inject 100 mg into the muscle. Take as needed when unable to swallow prednisone tablets     [provider]  leflunomide (ARAVA) 20 MG tablet TAKE 1 TABLET BY MOUTH ONCE DAILY. 12/05/17   Ofilia Neas, PA-C  lidocaine (XYLOCAINE) 2 % solution 2 TSP  PO qac/hs PRN FOR upper ABDOMINAL OR CHEST PAIN. MAY REPEAT DOSE EVERY 4 HOURS. NO MORE THAN 8 DOSES A DAY. 05/15/17   Fields, Marga Melnick, MD  losartan (COZAAR) 50 MG tablet Take 100 mg by mouth daily.     [provider]  metoprolol succinate (TOPROL-XL) 50 MG 24 hr tablet Take 1 tablet (50 mg total) by mouth daily. Take with or immediately following a meal. 07/03/11   Hensel, Jamal Collin, MD  Misc Natural Products (TART CHERRY ADVANCED PO) Take 1 tablet by mouth daily. daily     [provider]  Multiple Vitamin (MULTIVITAMIN) tablet Take 1 tablet by mouth daily.      [provider]  nortriptyline (PAMELOR) 25 MG capsule TAKE (1) CAPSULE BY MOUTH AT BEDTIME. 12/05/17   Ofilia Neas, PA-C  omeprazole (PRILOSEC) 20 MG capsule TAKE ONE CAPSULE BY MOUTH TWICE DAILY. 10/14/17   Mahala Menghini, PA-C  potassium chloride SA (K-DUR,KLOR-CON) 20 MEQ tablet Take 20 mEq by mouth daily.     [provider]  predniSONE (DELTASONE) 5 MG tablet Take 1  tablet (5 mg total) by mouth every morning. 01/06/18   Bo Merino, MD  PROBIOTIC CAPS Take 1 capsule by mouth daily. 07/03/11   Zenia Resides, MD  RESTASIS MULTIDOSE 0.05 % ophthalmic emulsion Place 1 drop into both eyes daily.  12/27/15   [provider]  spironolactone (ALDACTONE) 25 MG tablet Take 25 mg by mouth daily.     [provider]  SURE COMFORT PEN NEEDLES 31G X 5 MM [place]  05/23/16   [provider]  tiZANidine (ZANAFLEX) 4 MG tablet TAKE (1) TABLET BY MOUTH AT BEDTIME. Patient taking differently: TAKE (1) TABLET BY MOUTH AT BEDTIME. AS NEEDED 11/02/16   Bo Merino, MD  topiramate (TOPAMAX) 200 MG tablet TAKE (1) TABLET BY MOUTH AT BEDTIME. 11/04/17   Ofilia Neas, PA-C  Turmeric 500 MG CAPS Take 1 capsule by mouth daily.     [provider]  Zoledronic Acid (RECLAST IV) Inject into the vein.     [provider]  Glucosamine 500 MG TABS Take 4 tablets by mouth daily.    07/03/11  [provider]  simvastatin (ZOCOR) 40 MG tablet Take 20 mg by mouth at bedtime.   07/03/11  [provider]    Family History Family History  Problem Relation Age of Onset  . Diabetes Mother   . Pancreatitis Mother   . Arthritis Mother        psoriatic   . Fibromyalgia Mother   . Rheumatic fever Father   . Diabetes Unknown        grandparents  . Skin cancer Unknown        grandfather  . Hypertension Unknown        grandparent  . Congestive Heart Failure Unknown        grandfather  . Parkinson's disease Unknown  grandmother  . Colon cancer Maternal Uncle   . Fibromyalgia Sister   . Rheum arthritis Sister   . Diabetes Sister   . Fibromyalgia Sister   . Diabetes Sister   . Hypertension Son   . Colon polyps Neg Hx     Social History Social History   Tobacco Use  . Smoking status: Never Smoker  . Smokeless tobacco: Never Used  Substance Use Topics  . Alcohol use: No  . Drug use: No     Allergies     Adalimumab; Imuran [azathioprine sodium]; Methotrexate; Plaquenil [hydroxychloroquine sulfate]; Sulfonamide derivatives; Ceftin [cefuroxime axetil]; and Morphine   Review of Systems Review of Systems  All other systems reviewed and are negative.    Physical Exam Updated Vital Signs BP (!) 127/104 (BP Location: Right Arm)   Pulse 85   Temp 97.7 F (36.5 C) (Oral)   Resp 16   LMP 02/01/1999   SpO2 93%   Physical Exam  Constitutional: She appears well-developed and well-nourished. No distress.  HENT:  Head: Normocephalic.  Mouth/Throat: Oropharynx is clear and moist. No oropharyngeal exudate.  Slight abrasion over the nasal bridge, no tenderness over the nasal bones, no epistaxis  Eyes: Pupils are equal, round, and reactive to light. Conjunctivae and EOM are normal. Right eye exhibits no discharge. Left eye exhibits no discharge. No scleral icterus.  Neck: Normal range of motion. Neck supple. No JVD present. No thyromegaly present.  Cardiovascular: Normal rate, regular rhythm, normal heart sounds and intact distal pulses. Exam reveals no gallop and no friction rub.  No murmur heard. Pulmonary/Chest: Effort normal and breath sounds normal. No respiratory distress. She has no wheezes. She has no rales.  Abdominal: Soft. Bowel sounds are normal. She exhibits no distension and no mass. There is no tenderness.  Musculoskeletal: Normal range of motion. She exhibits no edema or tenderness.  Has normal ROM of all 4 extremities - and no deformity other than chronic hand deformity from arthritis  No specific thoracic spine or cervical spine tenderness  Lymphadenopathy:    She has no cervical adenopathy.  Neurological: She is alert. Coordination normal.  Able to straight leg raise bilaterally with normal strength, she has normal sensation of the bilateral lower extremities to both light touch and pinprick as well.  Upper extremities with equal grips however she has a slight weakness of  the right upper extremity compared to the left.  Coordination seems normal, speech is normal, cranial nerves III through XII are grossly normal.  Skin: Skin is warm and dry. No rash noted. No erythema.  Psychiatric: She has a normal mood and affect. Her behavior is normal.  Nursing note and vitals reviewed.    ED Treatments / Results  Labs (all labs ordered are listed, but only abnormal results are displayed) Labs Reviewed  CBG MONITORING, ED - Abnormal; Notable for the following components:      Result Value   Glucose-Capillary 168 (*)    All other components within normal limits    EKG None  Radiology No results found.  Procedures Procedures (including critical care time)  Medications Ordered in ED Medications  bacitracin ointment 1 application (has no administration in time range)  methocarbamol (ROBAXIN) tablet 1,000 mg (1,000 mg Oral Given 01/24/18 1445)     Initial Impression / Assessment and Plan / ED Course  I have reviewed the triage vital signs and the nursing notes.  Pertinent labs & imaging results that were available during my care of the patient  were reviewed by me and considered in my medical decision making (see chart for details).    The cause of the patient's fall is mechanical, the traumatic injuries may involve cervical spine and spinal cord given the upper extremity weakness and numbness.  Would consider central cord syndrome or other injury.  We will start with CT scan, may need MRI, objectively she has very good strength with only a slight weakness of the right upper extremity.  No lower extremity symptoms.  Upper chest has slight decreased sensation bilaterally, normal range of motion of the joints with strength of the shoulders.  Care discussed with Dr. Wilson Singer who will take over care at change of shift, pending CT scans - to disposition accordingly when imaging returns.  Final Clinical Impressions(s) / ED Diagnoses   Final diagnoses:  None       Noemi Chapel, MD 01/24/18 [phone]

## 2018-01-24 NOTE — ED Triage Notes (Signed)
Pt brought in by EMS due to fall. Pt was pulled down by dog. Pt has abrasions on face. Pt c/o bilateral arm numbness after fall. Pt a&ox4.

## 2018-01-24 NOTE — H&P (Signed)
Tonya Hall is an 67 y.o. female.   Chief Complaint: neck pain and numbness in her hands HPI: 67 year old female whose had a fall landing on her face the in the prone position earlier today initially she couldn't move her arms or legs felt numb in her arms or legs. Slowly her lower 70s started coming back as well as her upper 1s. She is currently complaining of numbness in her hands and weakness in her grip but she says she has no numbness in her legs or legs are completely recovered and she has neck pain and headaches.  Past Medical History:  Diagnosis Date  . Asthma    Albuterol in haler prn  . Cataract    immature unsure which eye  . Chronic back pain   . Degenerative disk disease    psoriatic  . Diabetes mellitus without complication (HCC)    diet controlled  . Esophageal motility disorder    Non-specific, see modified barium study/speech path, BP  . GERD (gastroesophageal reflux disease)    takes Protonix bid  . History of blood transfusion    post c-section  . History of bronchitis   . HTN (hypertension)   . Hyperlipidemia    takes Pravastatin daily  . Lymphocytic colitis 05/26/2010   Responded to Entocort x 3 MOS  . Neuropathy   . Osteoporosis    gets Boniva every 3 months  . Peripheral edema    takes HCTZ daily  . PONV (postoperative nausea and vomiting)   . Psoriatic arthritis ([place])    Dr. Katherina Right  . Raynaud's disease   . Seasonal allergies   . SUI (stress urinary incontinence, female)   . Urinary urgency     Past Surgical History:  Procedure Laterality Date  . BIOPSY  04/17/2016   Procedure: BIOPSY;  Surgeon: Danie Binder, MD;  Location: [facility];  Service: Endoscopy;;  random colon bx's  . CERVICAL SPINE SURGERY  09/09/2017  . CESAREAN SECTION  1974, 1978      . COLONOSCOPY  06/19/2008   OEU:[id number] internal hemorrhoids/mild sigmoin colon diverticulosis  . COLONOSCOPY  2012   Dr. Gala Romney: normal rectum, diverticula, lymphocytic colitis   .  COLONOSCOPY WITH PROPOFOL N/A 04/17/2016   Procedure: COLONOSCOPY WITH PROPOFOL;  Surgeon: Danie Binder, MD;  Location: [facility];  Service: Endoscopy;  Laterality: N/A;  900  . DILATION AND CURETTAGE OF UTERUS  1977   spontaneous abortion  . ESOPHAGOGASTRODUODENOSCOPY  06/19/2008   [id number] gastritis  . LUMBAR DISC ARTHROPLASTY  7/98  . LUMBAR FUSION  6/03, 11/08, 11/14   L4-5 fusion, L2-3 fusion, L1-2  . TONSILLECTOMY AND ADENOIDECTOMY    . TUBAL LIGATION      Family History  Problem Relation Age of Onset  . Diabetes Mother   . Pancreatitis Mother   . Arthritis Mother        psoriatic   . Fibromyalgia Mother   . Rheumatic fever Father   . Diabetes Unknown        grandparents  . Skin cancer Unknown        grandfather  . Hypertension Unknown        grandparent  . Congestive Heart Failure Unknown        grandfather  . Parkinson's disease Unknown        grandmother  . Colon cancer Maternal Uncle   . Fibromyalgia Sister   . Rheum arthritis Sister   . Diabetes Sister   . Fibromyalgia  Sister   . Diabetes Sister   . Hypertension Son   . Colon polyps Neg Hx    Social History:  reports that she has never smoked. She has never used smokeless tobacco. She reports that she does not drink alcohol or use drugs.  Allergies:  Allergies  Allergen Reactions  . Adalimumab Rash and Other (See Comments)    FEVER, Fast pulse  . Imuran [Azathioprine Sodium] Rash    Denies Airway involvement  . Methotrexate Other (See Comments)    Liver Enzyme elevation - after 1 month of use  . Plaquenil [Hydroxychloroquine Sulfate] Rash    Denies airway involvement.   . Sulfonamide Derivatives Rash    Occurred with Sulfasalazine. Rash only.   . Ceftin [Cefuroxime Axetil] Nausea Only    Upset stomach  . Morphine Nausea And Vomiting    IV morphine caused N and V  After surgery.   Has tolerated Kadian in the past      (Not in a hospital admission)  Results for orders placed or  performed during the hospital encounter of 01/24/18 (from the past 48 hour(s))  CBG monitoring, ED     Status: Abnormal   Collection Time: 01/24/18  1:49 PM  Result Value Ref Range   Glucose-Capillary 168 (H) 70 - 99 mg/dL  CBC with Differential     Status: Abnormal   Collection Time: 01/24/18  6:29 PM  Result Value Ref Range   WBC 15.6 (H) 4.0 - 10.5 K/uL   RBC 4.17 3.87 - 5.11 MIL/uL   Hemoglobin 12.9 12.0 - 15.0 g/dL   HCT 38.7 36.0 - 46.0 %   MCV 92.8 80.0 - 100.0 fL   MCH 30.9 26.0 - 34.0 pg   MCHC 33.3 30.0 - 36.0 g/dL   RDW 12.5 11.5 - 15.5 %   Platelets 234 150 - 400 K/uL   nRBC 0.0 0.0 - 0.2 %   Neutrophils Relative % 79 %   Neutro Abs 12.4 (H) 1.7 - 7.7 K/uL   Lymphocytes Relative 11 %   Lymphs Abs 1.8 0.7 - 4.0 K/uL   Monocytes Relative 7 %   Monocytes Absolute 1.0 0.1 - 1.0 K/uL   Eosinophils Relative 1 %   Eosinophils Absolute 0.2 0.0 - 0.5 K/uL   Basophils Relative 1 %   Basophils Absolute 0.1 0.0 - 0.1 K/uL   Immature Granulocytes 1 %   Abs Immature Granulocytes 0.08 (H) 0.00 - 0.07 K/uL    Comment: Performed at [facility], [address], [place], [place] [address]  Basic metabolic panel     Status: Abnormal   Collection Time: 01/24/18  6:29 PM  Result Value Ref Range   Sodium 132 (L) 135 - 145 mmol/L   Potassium 3.9 3.5 - 5.1 mmol/L   Chloride 96 (L) 98 - 111 mmol/L   CO2 21 (L) 22 - 32 mmol/L   Glucose, Bld 119 (H) 70 - 99 mg/dL   BUN 13 8 - 23 mg/dL   Creatinine, Ser 0.68 0.44 - 1.00 mg/dL   Calcium 9.4 8.9 - 10.3 mg/dL   GFR calc non Af Amer >60 >60 mL/min   GFR calc Af Amer >60 >60 mL/min    Comment: (NOTE) The eGFR has been calculated using the CKD EPI equation. This calculation has not been validated in all clinical situations. eGFR's persistently <60 mL/min signify possible Chronic Kidney Disease.    Anion gap 15 5 - 15    Comment: Performed at  [facility], [place] [address], [place], [place] [address]   Ct Head Wo  Contrast  Result Date: 01/24/2018 CLINICAL DATA:  Fall.  Pulled down by dog. EXAM: CT HEAD WITHOUT CONTRAST CT CERVICAL SPINE WITHOUT CONTRAST TECHNIQUE: Multidetector CT imaging of the head and cervical spine was performed following the standard protocol without intravenous contrast. Multiplanar CT image reconstructions of the cervical spine were also generated. COMPARISON:  None. FINDINGS: CT HEAD FINDINGS Brain: There is no mass, hemorrhage or extra-axial collection. The size and configuration of the ventricles and extra-axial CSF spaces are normal. There is no acute or chronic infarction. There is hypoattenuation of the periventricular white matter, most commonly indicating chronic ischemic microangiopathy. Vascular: No abnormal hyperdensity of the major intracranial arteries or dural venous sinuses. No intracranial atherosclerosis. Skull: The visualized skull base, calvarium and extracranial soft tissues are normal. Sinuses/Orbits: No fluid levels or advanced mucosal thickening of the visualized paranasal sinuses. No mastoid or middle ear effusion. The orbits are normal. CT CERVICAL SPINE FINDINGS Alignment: There is a type 2 fracture of the dens with posterior displacement of the superior fragment. There is posterior subluxation of C1 relative to C2. Occipital condyles and lateral masses of C1 remain aligned. Skull base and vertebrae: Type 2 fracture of the dens with posterior displacement of the superior fragment. No fracture through the posterior elements. No other fracture. C3-C5 ACDF without perihardware lucency. Soft tissues and spinal canal: Mild prevertebral soft tissue swelling at the C1-2 level. Disc levels: There is mild narrowing of the spinal canal at the level of the C2 fracture. There is right C2-5 and left C3-C6 facet arthrosis. Upper chest: No pneumothorax, pulmonary nodule or pleural effusion. Other: Normal visualized paraspinal cervical soft tissues. IMPRESSION: 1. No acute intracranial  abnormality. 2. Type 2 fracture of the dens with posterior displacement of the superior fragment by approximately 4 mm. MRI is recommended to assess for ligamentous injury and posterior tension band disruption. Critical Value/emergent results were called by telephone at the time of interpretation on 01/24/2018 at 4:45 pm to Dr. Noemi Chapel via the patient's nurse, Corliss Parish. Electronically Signed   By: Ulyses Jarred M.D.   On: 01/24/2018 16:49   Ct Cervical Spine Wo Contrast  Result Date: 01/24/2018 CLINICAL DATA:  Fall.  Pulled down by dog. EXAM: CT HEAD WITHOUT CONTRAST CT CERVICAL SPINE WITHOUT CONTRAST TECHNIQUE: Multidetector CT imaging of the head and cervical spine was performed following the standard protocol without intravenous contrast. Multiplanar CT image reconstructions of the cervical spine were also generated. COMPARISON:  None. FINDINGS: CT HEAD FINDINGS Brain: There is no mass, hemorrhage or extra-axial collection. The size and configuration of the ventricles and extra-axial CSF spaces are normal. There is no acute or chronic infarction. There is hypoattenuation of the periventricular white matter, most commonly indicating chronic ischemic microangiopathy. Vascular: No abnormal hyperdensity of the major intracranial arteries or dural venous sinuses. No intracranial atherosclerosis. Skull: The visualized skull base, calvarium and extracranial soft tissues are normal. Sinuses/Orbits: No fluid levels or advanced mucosal thickening of the visualized paranasal sinuses. No mastoid or middle ear effusion. The orbits are normal. CT CERVICAL SPINE FINDINGS Alignment: There is a type 2 fracture of the dens with posterior displacement of the superior fragment. There is posterior subluxation of C1 relative to C2. Occipital condyles and lateral masses of C1 remain aligned. Skull base and vertebrae: Type 2 fracture of the dens with posterior displacement of the superior fragment. No fracture through the  posterior  elements. No other fracture. C3-C5 ACDF without perihardware lucency. Soft tissues and spinal canal: Mild prevertebral soft tissue swelling at the C1-2 level. Disc levels: There is mild narrowing of the spinal canal at the level of the C2 fracture. There is right C2-5 and left C3-C6 facet arthrosis. Upper chest: No pneumothorax, pulmonary nodule or pleural effusion. Other: Normal visualized paraspinal cervical soft tissues. IMPRESSION: 1. No acute intracranial abnormality. 2. Type 2 fracture of the dens with posterior displacement of the superior fragment by approximately 4 mm. MRI is recommended to assess for ligamentous injury and posterior tension band disruption. Critical Value/emergent results were called by telephone at the time of interpretation on 01/24/2018 at 4:45 pm to Dr. Noemi Chapel via the patient's nurse, Corliss Parish. Electronically Signed   By: Ulyses Jarred M.D.   On: 01/24/2018 16:49   Mr Cervical Spine Wo Contrast  Result Date: 01/24/2018 CLINICAL DATA:  67 y/o F; cervical spinal trauma, ligamentous injury suspected. EXAM: MRI CERVICAL SPINE WITHOUT CONTRAST TECHNIQUE: Multiplanar, multisequence MR imaging of the cervical spine was performed. No intravenous contrast was administered. COMPARISON:  01/24/2018 CT of the cervical spine. 11/22/2017 MRI of the cervical spine. FINDINGS: Alignment: Straightening of cervical lordosis. C6-7 stable retrolisthesis and C7-T1 stable anterolisthesis. Vertebrae: Acute type 2 odontoid process fracture. Normal anterior C1-2 articulation. Normal atlantooccipital articulation. 4 mm posterior displacement of the odontoid fracture fragment and C1 vertebral body relative to the C2 vertebral body with capsular injury of the lateral C1-2 joints and posterior joint subluxation. Partial tears within the anterior and posterior longitudinal ligaments at odontoid fracture (series 7, image 8 and 10). Mildly increased signal within the interspinous ligaments from  C1-C2 compatible with ligamentous injury. Small epidural hematoma associated with the odontoid fracture measuring up to 5 mm in thickness dorsal to the C2 vertebral body. Mild associated spinal canal stenosis. C3-C5 anterior cervical discectomy and fusion. Susceptibility artifact from the fusion hardware obscures the vertebral bodies at those levels, however, there is no paravertebral edema indicate underlying injury. Cord: Mildly increased signal of the cord at the C2 level (series 8, image 9) compatible with cord contusion. No findings of cord hemorrhage. Posterior Fossa, vertebral arteries, paraspinal tissues: Prevertebral edema from the skull base to the C3 level. Disc levels: C2-3: Predominantly right-sided uncovertebral and facet degenerative changes with moderate foraminal stenosis. Mild canal stenosis. C3-4: ACDF. Bilateral facet hypertrophy with mild foraminal stenosis. No canal stenosis. C4-5: ACDF. Predominant left-sided facet hypertrophy with moderate left foraminal stenosis. No significant canal stenosis. C5-6: Stable disc osteophyte complex eccentric to the left with left-greater-than-right uncovertebral and facet hypertrophy. Mild left foraminal stenosis. No canal stenosis. C6-7: Stable disc osteophyte complex with bilateral uncovertebral and facet hypertrophy greater on the right. Moderate right and mild left foraminal stenosis. Mild canal stenosis. Disc contact on the anterior cord with mild cord flattening. C7-T1: Anterolisthesis with small uncovered disc bulge and predominantly right-sided uncovertebral and facet hypertrophy. Mild-to-moderate right foraminal stenosis. No canal stenosis. IMPRESSION: 1. Type 2 odontoid fracture and bilateral C1-2 lateral joint capsular injury with 4 mm posterior subluxation of the odontoid fragment/joints. 2. Normal anterior C1-2 and normal atlantooccipital articulation. 3. Mildly increased cord signal at C2 compatible with cord contusion. No findings of cord  hemorrhage. 4. Partial tear of anterior and posterior longitudinal ligaments at C2. 5. Mild interspinous edema at C1-C2 indicating ligament injury. 6. Small epidural hematoma at C2 level with mild canal stenosis. No cord compression. 7. Stable cervical spine spondylosis. Electronically Signed   By: Mia Creek  [place]-Stratton M.D.   On: 01/24/2018 20:12    Review of Systems  Musculoskeletal: Positive for neck pain.  Neurological: Positive for tingling, sensory change and focal weakness.    Blood pressure (!) 156/81, pulse 95, temperature 97.7 F (36.5 C), temperature source Oral, resp. rate 18, last menstrual period 02/01/1999, SpO2 100 %. Physical Exam  Constitutional: She is oriented to person, place, and time. She appears well-developed.  HENT:  Head: Normocephalic.  Eyes: Pupils are equal, round, and reactive to light.  Neck: Normal range of motion.  Cardiovascular: Normal rate.  Respiratory: Effort normal.  GI: Soft.  Musculoskeletal: Normal range of motion.  Neurological: She is alert and oriented to person, place, and time. She has normal strength. GCS eye subscore is 4. GCS verbal subscore is 5. GCS motor subscore is 6.  She is awake and alert cranial nerves are intact strength is 5 out of 5 deltoid, bicep bilaterally she has some triceps weakness at 4-4+ out of 5 and intrinsic weakness at 4+ out of 5 bilaterally lower extremity strength appears to be 5 out of 5 bilaterally. She has normal sensation lower extremities she has decreased sensation in bilateral hands.  Skin: Skin is warm and dry.     Assessment/Plan 67 year old female with a type II odontoid fracture with slight posterior displacement. Does not appear to be compressing the spinal cord right now however there does appear to be a cord contusion. We'll get the patient in a cervical collar placed the patient IV Decadron patient will be observed at bedrest over the weekend and we will plan odontoid screw fixation or C1-2   fixation.  Wadsworth Skolnick P, MD 01/24/2018, 8:50 PM

## 2018-01-24 NOTE — ED Notes (Signed)
Neuro MD at bedside

## 2018-01-24 NOTE — ED Provider Notes (Signed)
Assumed care at change of shift with CT pending.  Patient does have a type II dens fracture.  Results reviewed with pt and her husband. She is still complaining of numbness in bilateral hands and some mild weakness in her right arm.  Objectively her strength seems normal on my exam though.  She is already in an [employer].  Neurosurgery was consulted.  MRI.  CRITICAL CARE Performed by: Virgel Manifold Total critical care time: 35 minutes Critical care time was exclusive of separately billable procedures and treating other patients. Critical care was necessary to treat or prevent imminent or life-threatening deterioration. Critical care was time spent personally by me on the following activities: development of treatment plan with patient and/or surrogate as well as nursing, discussions with consultants, evaluation of patient's response to treatment, examination of patient, obtaining history from patient or surrogate, ordering and performing treatments and interventions, ordering and review of laboratory studies, ordering and review of radiographic studies, pulse oximetry and re-evaluation of patient's condition.    Virgel Manifold, MD 01/24/18 [phone]

## 2018-01-25 LAB — CBG MONITORING, ED: Glucose-Capillary: 288 mg/dL — ABNORMAL HIGH (ref 70–99)

## 2018-01-25 MED ORDER — ACETAMINOPHEN 325 MG PO TABS: 650.0000 mg | ORAL_TABLET | ORAL | Status: DC | PRN

## 2018-01-25 MED ORDER — ONDANSETRON HCL 4 MG PO TABS: 4.0000 mg | ORAL_TABLET | Freq: Four times a day (QID) | ORAL | Status: DC | PRN

## 2018-01-25 MED ORDER — CYCLOBENZAPRINE HCL 10 MG PO TABS
10.0000 mg | ORAL_TABLET | Freq: Three times a day (TID) | ORAL | Status: DC | PRN
  Administered 2018-01-25 – 2018-01-27 (×3): 10 mg via ORAL
  Filled 2018-01-25 (×3): qty 1

## 2018-01-25 MED ORDER — HYDROMORPHONE HCL 1 MG/ML IJ SOLN
1.0000 mg | INTRAMUSCULAR | Status: DC | PRN
  Administered 2018-01-27: 1 mg via INTRAVENOUS
  Filled 2018-01-25: qty 1

## 2018-01-25 MED ORDER — ALBUTEROL SULFATE (2.5 MG/3ML) 0.083% IN NEBU
2.5000 mg | INHALATION_SOLUTION | Freq: Four times a day (QID) | RESPIRATORY_TRACT | Status: DC | PRN
  Administered 2018-01-26: 2.5 mg via RESPIRATORY_TRACT
  Filled 2018-01-25: qty 3

## 2018-01-25 MED ORDER — MENTHOL 3 MG MT LOZG
1.0000 | LOZENGE | OROMUCOSAL | Status: DC | PRN
  Filled 2018-01-25: qty 9

## 2018-01-25 MED ORDER — CYCLOSPORINE 0.05 % OP EMUL
1.0000 [drp] | Freq: Two times a day (BID) | OPHTHALMIC | Status: DC
  Administered 2018-01-25 – 2018-01-29 (×7): 1 [drp] via OPHTHALMIC
  Filled 2018-01-25 (×8): qty 1

## 2018-01-25 MED ORDER — SODIUM CHLORIDE 0.9% FLUSH
3.0000 mL | Freq: Two times a day (BID) | INTRAVENOUS | Status: DC
  Administered 2018-01-25 – 2018-01-29 (×9): 3 mL via INTRAVENOUS

## 2018-01-25 MED ORDER — SODIUM CHLORIDE 0.9 % IV SOLN: 250.0000 mL | INTRAVENOUS | Status: DC

## 2018-01-25 MED ORDER — SODIUM CHLORIDE 0.9% FLUSH
3.0000 mL | INTRAVENOUS | Status: DC | PRN
  Administered 2018-01-25: 3 mL via INTRAVENOUS

## 2018-01-25 MED ORDER — OXYCODONE HCL 5 MG PO TABS
10.0000 mg | ORAL_TABLET | ORAL | Status: DC | PRN
  Administered 2018-01-25 – 2018-01-28 (×5): 10 mg via ORAL
  Filled 2018-01-25 (×6): qty 2

## 2018-01-25 MED ORDER — ALUM & MAG HYDROXIDE-SIMETH 200-200-20 MG/5ML PO SUSP: 30.0000 mL | Freq: Four times a day (QID) | ORAL | Status: DC | PRN

## 2018-01-25 MED ORDER — ONDANSETRON HCL 4 MG/2ML IJ SOLN: 4.0000 mg | Freq: Four times a day (QID) | INTRAMUSCULAR | Status: DC | PRN

## 2018-01-25 MED ORDER — ACETAMINOPHEN 650 MG RE SUPP: 650.0000 mg | RECTAL | Status: DC | PRN

## 2018-01-25 MED ORDER — PANTOPRAZOLE SODIUM 40 MG PO TBEC
40.0000 mg | DELAYED_RELEASE_TABLET | Freq: Every day | ORAL | Status: DC
  Administered 2018-01-25 – 2018-01-26 (×2): 40 mg via ORAL
  Filled 2018-01-25 (×3): qty 1

## 2018-01-25 MED ORDER — PHENOL 1.4 % MT LIQD
1.0000 | OROMUCOSAL | Status: DC | PRN
  Filled 2018-01-25: qty 177

## 2018-01-25 NOTE — ED Notes (Signed)
Family visiting w/pt.

## 2018-01-25 NOTE — ED Notes (Addendum)
Pt sitting up in recliner - Aspen collar remains intact. CBG checked per pt's request. States she usually checks it in am. Pt had eaten cake prior. Family brought pt food.

## 2018-01-25 NOTE — ED Notes (Addendum)
Pt noted to be eating breakfast. States her pain is in more control at this time and requested for pain med to be given later - not now. Spouse at bedside - assisted pt to bathroom via w/c. Fall Risk bracelet applied to pt.

## 2018-01-25 NOTE — ED Notes (Signed)
Pt being transported to 4NP03 via bed. Pt noted to be wearing her eyeglasses and gown. Pt verbalized she has all of her belongings including her I-pad.

## 2018-01-25 NOTE — ED Notes (Signed)
Dr Saintclair Halsted assessed pt this am. Family at bedside. Aspen collar remains intact.

## 2018-01-25 NOTE — Progress Notes (Signed)
Subjective: Patient reports patient doing better improved pain stable numbness in her arms and hands  Objective: Vital signs in last 24 hours: Temp:  [97.7 F (36.5 C)-98.7 F (37.1 C)] 98.7 F (37.1 C) (10/26 0650) Pulse Rate:  [85-111] 111 (10/26 0929) Resp:  [16-20] 20 (10/26 0929) BP: (127-176)/(81-104) 164/88 (10/26 0929) SpO2:  [93 %-100 %] 95 % (10/26 0929)  Intake/Output from previous day: No intake/output data recorded. Intake/Output this shift: No intake/output data recorded.  neurologically improved slight increase grip strength still bicep weakness triceps stronger  Lab Results: Recent Labs    01/24/18 1829  WBC 15.6*  HGB 12.9  HCT 38.7  PLT 234   BMET Recent Labs    01/24/18 1829  NA 132*  K 3.9  CL 96*  CO2 21*  GLUCOSE 119*  BUN 13  CREATININE 0.68  CALCIUM 9.4    Studies/Results: Ct Head Wo Contrast  Result Date: 01/24/2018 CLINICAL DATA:  Fall.  Pulled down by dog. EXAM: CT HEAD WITHOUT CONTRAST CT CERVICAL SPINE WITHOUT CONTRAST TECHNIQUE: Multidetector CT imaging of the head and cervical spine was performed following the standard protocol without intravenous contrast. Multiplanar CT image reconstructions of the cervical spine were also generated. COMPARISON:  None. FINDINGS: CT HEAD FINDINGS Brain: There is no mass, hemorrhage or extra-axial collection. The size and configuration of the ventricles and extra-axial CSF spaces are normal. There is no acute or chronic infarction. There is hypoattenuation of the periventricular white matter, most commonly indicating chronic ischemic microangiopathy. Vascular: No abnormal hyperdensity of the major intracranial arteries or dural venous sinuses. No intracranial atherosclerosis. Skull: The visualized skull base, calvarium and extracranial soft tissues are normal. Sinuses/Orbits: No fluid levels or advanced mucosal thickening of the visualized paranasal sinuses. No mastoid or middle ear effusion. The orbits  are normal. CT CERVICAL SPINE FINDINGS Alignment: There is a type 2 fracture of the dens with posterior displacement of the superior fragment. There is posterior subluxation of C1 relative to C2. Occipital condyles and lateral masses of C1 remain aligned. Skull base and vertebrae: Type 2 fracture of the dens with posterior displacement of the superior fragment. No fracture through the posterior elements. No other fracture. C3-C5 ACDF without perihardware lucency. Soft tissues and spinal canal: Mild prevertebral soft tissue swelling at the C1-2 level. Disc levels: There is mild narrowing of the spinal canal at the level of the C2 fracture. There is right C2-5 and left C3-C6 facet arthrosis. Upper chest: No pneumothorax, pulmonary nodule or pleural effusion. Other: Normal visualized paraspinal cervical soft tissues. IMPRESSION: 1. No acute intracranial abnormality. 2. Type 2 fracture of the dens with posterior displacement of the superior fragment by approximately 4 mm. MRI is recommended to assess for ligamentous injury and posterior tension band disruption. Critical Value/emergent results were called by telephone at the time of interpretation on 01/24/2018 at 4:45 pm to Dr. Noemi Chapel via the patient's nurse, Corliss Parish. Electronically Signed   By: Ulyses Jarred M.D.   On: 01/24/2018 16:49   Ct Cervical Spine Wo Contrast  Result Date: 01/24/2018 CLINICAL DATA:  Fall.  Pulled down by dog. EXAM: CT HEAD WITHOUT CONTRAST CT CERVICAL SPINE WITHOUT CONTRAST TECHNIQUE: Multidetector CT imaging of the head and cervical spine was performed following the standard protocol without intravenous contrast. Multiplanar CT image reconstructions of the cervical spine were also generated. COMPARISON:  None. FINDINGS: CT HEAD FINDINGS Brain: There is no mass, hemorrhage or extra-axial collection. The size and configuration of the ventricles and  extra-axial CSF spaces are normal. There is no acute or chronic infarction. There is  hypoattenuation of the periventricular white matter, most commonly indicating chronic ischemic microangiopathy. Vascular: No abnormal hyperdensity of the major intracranial arteries or dural venous sinuses. No intracranial atherosclerosis. Skull: The visualized skull base, calvarium and extracranial soft tissues are normal. Sinuses/Orbits: No fluid levels or advanced mucosal thickening of the visualized paranasal sinuses. No mastoid or middle ear effusion. The orbits are normal. CT CERVICAL SPINE FINDINGS Alignment: There is a type 2 fracture of the dens with posterior displacement of the superior fragment. There is posterior subluxation of C1 relative to C2. Occipital condyles and lateral masses of C1 remain aligned. Skull base and vertebrae: Type 2 fracture of the dens with posterior displacement of the superior fragment. No fracture through the posterior elements. No other fracture. C3-C5 ACDF without perihardware lucency. Soft tissues and spinal canal: Mild prevertebral soft tissue swelling at the C1-2 level. Disc levels: There is mild narrowing of the spinal canal at the level of the C2 fracture. There is right C2-5 and left C3-C6 facet arthrosis. Upper chest: No pneumothorax, pulmonary nodule or pleural effusion. Other: Normal visualized paraspinal cervical soft tissues. IMPRESSION: 1. No acute intracranial abnormality. 2. Type 2 fracture of the dens with posterior displacement of the superior fragment by approximately 4 mm. MRI is recommended to assess for ligamentous injury and posterior tension band disruption. Critical Value/emergent results were called by telephone at the time of interpretation on 01/24/2018 at 4:45 pm to Dr. Noemi Chapel via the patient's nurse, Corliss Parish. Electronically Signed   By: Ulyses Jarred M.D.   On: 01/24/2018 16:49   Mr Cervical Spine Wo Contrast  Result Date: 01/24/2018 CLINICAL DATA:  67 y/o F; cervical spinal trauma, ligamentous injury suspected. EXAM: MRI CERVICAL  SPINE WITHOUT CONTRAST TECHNIQUE: Multiplanar, multisequence MR imaging of the cervical spine was performed. No intravenous contrast was administered. COMPARISON:  01/24/2018 CT of the cervical spine. 11/22/2017 MRI of the cervical spine. FINDINGS: Alignment: Straightening of cervical lordosis. C6-7 stable retrolisthesis and C7-T1 stable anterolisthesis. Vertebrae: Acute type 2 odontoid process fracture. Normal anterior C1-2 articulation. Normal atlantooccipital articulation. 4 mm posterior displacement of the odontoid fracture fragment and C1 vertebral body relative to the C2 vertebral body with capsular injury of the lateral C1-2 joints and posterior joint subluxation. Partial tears within the anterior and posterior longitudinal ligaments at odontoid fracture (series 7, image 8 and 10). Mildly increased signal within the interspinous ligaments from C1-C2 compatible with ligamentous injury. Small epidural hematoma associated with the odontoid fracture measuring up to 5 mm in thickness dorsal to the C2 vertebral body. Mild associated spinal canal stenosis. C3-C5 anterior cervical discectomy and fusion. Susceptibility artifact from the fusion hardware obscures the vertebral bodies at those levels, however, there is no paravertebral edema indicate underlying injury. Cord: Mildly increased signal of the cord at the C2 level (series 8, image 9) compatible with cord contusion. No findings of cord hemorrhage. Posterior Fossa, vertebral arteries, paraspinal tissues: Prevertebral edema from the skull base to the C3 level. Disc levels: C2-3: Predominantly right-sided uncovertebral and facet degenerative changes with moderate foraminal stenosis. Mild canal stenosis. C3-4: ACDF. Bilateral facet hypertrophy with mild foraminal stenosis. No canal stenosis. C4-5: ACDF. Predominant left-sided facet hypertrophy with moderate left foraminal stenosis. No significant canal stenosis. C5-6: Stable disc osteophyte complex eccentric to  the left with left-greater-than-right uncovertebral and facet hypertrophy. Mild left foraminal stenosis. No canal stenosis. C6-7: Stable disc osteophyte complex with bilateral uncovertebral  and facet hypertrophy greater on the right. Moderate right and mild left foraminal stenosis. Mild canal stenosis. Disc contact on the anterior cord with mild cord flattening. C7-T1: Anterolisthesis with small uncovered disc bulge and predominantly right-sided uncovertebral and facet hypertrophy. Mild-to-moderate right foraminal stenosis. No canal stenosis. IMPRESSION: 1. Type 2 odontoid fracture and bilateral C1-2 lateral joint capsular injury with 4 mm posterior subluxation of the odontoid fragment/joints. 2. Normal anterior C1-2 and normal atlantooccipital articulation. 3. Mildly increased cord signal at C2 compatible with cord contusion. No findings of cord hemorrhage. 4. Partial tear of anterior and posterior longitudinal ligaments at C2. 5. Mild interspinous edema at C1-C2 indicating ligament injury. 6. Small epidural hematoma at C2 level with mild canal stenosis. No cord compression. 7. Stable cervical spine spondylosis. Electronically Signed   By: Kristine Garbe M.D.   On: 01/24/2018 20:12    Assessment/Plan: Possibly 1 type II odontoid fracture continued continue observation hospital in a cervical collar plan fixation early next week.  LOS: 1 day     Savino Whisenant P 01/25/2018, 9:43 AM

## 2018-01-26 LAB — GLUCOSE, CAPILLARY
Glucose-Capillary: 242 mg/dL — ABNORMAL HIGH (ref 70–99)
Glucose-Capillary: 262 mg/dL — ABNORMAL HIGH (ref 70–99)

## 2018-01-26 MED ORDER — PSEUDOEPHEDRINE HCL 30 MG PO TABS
30.0000 mg | ORAL_TABLET | Freq: Three times a day (TID) | ORAL | Status: DC | PRN
  Administered 2018-01-26 – 2018-01-29 (×6): 30 mg via ORAL
  Filled 2018-01-26 (×8): qty 1

## 2018-01-26 NOTE — Progress Notes (Signed)
Subjective: Patient reports mild neck pain. Did have some posterior headaches last night. Hand strength has improved. No NT anymore  Objective: Vital signs in last 24 hours: Temp:  [97.8 F (36.6 C)-98.8 F (37.1 C)] 98.3 F (36.8 C) (10/27 0715) Pulse Rate:  [92-118] 118 (10/27 0715) Resp:  [18-20] 20 (10/27 0715) BP: (142-164)/(74-93) 159/81 (10/27 0715) SpO2:  [93 %-96 %] 93 % (10/27 0715) Weight:  [62.4 kg] 62.4 kg (10/26 1436)  Intake/Output from previous day: 10/26 0701 - 10/27 0700 In: 120 [P.O.:120] Out: 601 [Urine:601] Intake/Output this shift: No intake/output data recorded.  Neurologic: Grossly normal  Lab Results: Lab Results  Component Value Date   WBC 15.6 (H) 01/24/2018   HGB 12.9 01/24/2018   HCT 38.7 01/24/2018   MCV 92.8 01/24/2018   PLT 234 01/24/2018   No results found for: INR, PROTIME BMET Lab Results  Component Value Date   NA 132 (L) 01/24/2018   K 3.9 01/24/2018   CL 96 (L) 01/24/2018   CO2 21 (L) 01/24/2018   GLUCOSE 119 (H) 01/24/2018   BUN 13 01/24/2018   CREATININE 0.68 01/24/2018   CALCIUM 9.4 01/24/2018    Studies/Results: Ct Head Wo Contrast  Result Date: 01/24/2018 CLINICAL DATA:  Fall.  Pulled down by dog. EXAM: CT HEAD WITHOUT CONTRAST CT CERVICAL SPINE WITHOUT CONTRAST TECHNIQUE: Multidetector CT imaging of the head and cervical spine was performed following the standard protocol without intravenous contrast. Multiplanar CT image reconstructions of the cervical spine were also generated. COMPARISON:  None. FINDINGS: CT HEAD FINDINGS Brain: There is no mass, hemorrhage or extra-axial collection. The size and configuration of the ventricles and extra-axial CSF spaces are normal. There is no acute or chronic infarction. There is hypoattenuation of the periventricular white matter, most commonly indicating chronic ischemic microangiopathy. Vascular: No abnormal hyperdensity of the major intracranial arteries or dural venous sinuses.  No intracranial atherosclerosis. Skull: The visualized skull base, calvarium and extracranial soft tissues are normal. Sinuses/Orbits: No fluid levels or advanced mucosal thickening of the visualized paranasal sinuses. No mastoid or middle ear effusion. The orbits are normal. CT CERVICAL SPINE FINDINGS Alignment: There is a type 2 fracture of the dens with posterior displacement of the superior fragment. There is posterior subluxation of C1 relative to C2. Occipital condyles and lateral masses of C1 remain aligned. Skull base and vertebrae: Type 2 fracture of the dens with posterior displacement of the superior fragment. No fracture through the posterior elements. No other fracture. C3-C5 ACDF without perihardware lucency. Soft tissues and spinal canal: Mild prevertebral soft tissue swelling at the C1-2 level. Disc levels: There is mild narrowing of the spinal canal at the level of the C2 fracture. There is right C2-5 and left C3-C6 facet arthrosis. Upper chest: No pneumothorax, pulmonary nodule or pleural effusion. Other: Normal visualized paraspinal cervical soft tissues. IMPRESSION: 1. No acute intracranial abnormality. 2. Type 2 fracture of the dens with posterior displacement of the superior fragment by approximately 4 mm. MRI is recommended to assess for ligamentous injury and posterior tension band disruption. Critical Value/emergent results were called by telephone at the time of interpretation on 01/24/2018 at 4:45 pm to Dr. Noemi Chapel via the patient's nurse, Corliss Parish. Electronically Signed   By: Ulyses Jarred M.D.   On: 01/24/2018 16:49   Ct Cervical Spine Wo Contrast  Result Date: 01/24/2018 CLINICAL DATA:  Fall.  Pulled down by dog. EXAM: CT HEAD WITHOUT CONTRAST CT CERVICAL SPINE WITHOUT CONTRAST TECHNIQUE: Multidetector CT imaging  of the head and cervical spine was performed following the standard protocol without intravenous contrast. Multiplanar CT image reconstructions of the cervical  spine were also generated. COMPARISON:  None. FINDINGS: CT HEAD FINDINGS Brain: There is no mass, hemorrhage or extra-axial collection. The size and configuration of the ventricles and extra-axial CSF spaces are normal. There is no acute or chronic infarction. There is hypoattenuation of the periventricular white matter, most commonly indicating chronic ischemic microangiopathy. Vascular: No abnormal hyperdensity of the major intracranial arteries or dural venous sinuses. No intracranial atherosclerosis. Skull: The visualized skull base, calvarium and extracranial soft tissues are normal. Sinuses/Orbits: No fluid levels or advanced mucosal thickening of the visualized paranasal sinuses. No mastoid or middle ear effusion. The orbits are normal. CT CERVICAL SPINE FINDINGS Alignment: There is a type 2 fracture of the dens with posterior displacement of the superior fragment. There is posterior subluxation of C1 relative to C2. Occipital condyles and lateral masses of C1 remain aligned. Skull base and vertebrae: Type 2 fracture of the dens with posterior displacement of the superior fragment. No fracture through the posterior elements. No other fracture. C3-C5 ACDF without perihardware lucency. Soft tissues and spinal canal: Mild prevertebral soft tissue swelling at the C1-2 level. Disc levels: There is mild narrowing of the spinal canal at the level of the C2 fracture. There is right C2-5 and left C3-C6 facet arthrosis. Upper chest: No pneumothorax, pulmonary nodule or pleural effusion. Other: Normal visualized paraspinal cervical soft tissues. IMPRESSION: 1. No acute intracranial abnormality. 2. Type 2 fracture of the dens with posterior displacement of the superior fragment by approximately 4 mm. MRI is recommended to assess for ligamentous injury and posterior tension band disruption. Critical Value/emergent results were called by telephone at the time of interpretation on 01/24/2018 at 4:45 pm to Dr. Noemi Chapel  via the patient's nurse, Corliss Parish. Electronically Signed   By: Ulyses Jarred M.D.   On: 01/24/2018 16:49   Mr Cervical Spine Wo Contrast  Result Date: 01/24/2018 CLINICAL DATA:  67 y/o F; cervical spinal trauma, ligamentous injury suspected. EXAM: MRI CERVICAL SPINE WITHOUT CONTRAST TECHNIQUE: Multiplanar, multisequence MR imaging of the cervical spine was performed. No intravenous contrast was administered. COMPARISON:  01/24/2018 CT of the cervical spine. 11/22/2017 MRI of the cervical spine. FINDINGS: Alignment: Straightening of cervical lordosis. C6-7 stable retrolisthesis and C7-T1 stable anterolisthesis. Vertebrae: Acute type 2 odontoid process fracture. Normal anterior C1-2 articulation. Normal atlantooccipital articulation. 4 mm posterior displacement of the odontoid fracture fragment and C1 vertebral body relative to the C2 vertebral body with capsular injury of the lateral C1-2 joints and posterior joint subluxation. Partial tears within the anterior and posterior longitudinal ligaments at odontoid fracture (series 7, image 8 and 10). Mildly increased signal within the interspinous ligaments from C1-C2 compatible with ligamentous injury. Small epidural hematoma associated with the odontoid fracture measuring up to 5 mm in thickness dorsal to the C2 vertebral body. Mild associated spinal canal stenosis. C3-C5 anterior cervical discectomy and fusion. Susceptibility artifact from the fusion hardware obscures the vertebral bodies at those levels, however, there is no paravertebral edema indicate underlying injury. Cord: Mildly increased signal of the cord at the C2 level (series 8, image 9) compatible with cord contusion. No findings of cord hemorrhage. Posterior Fossa, vertebral arteries, paraspinal tissues: Prevertebral edema from the skull base to the C3 level. Disc levels: C2-3: Predominantly right-sided uncovertebral and facet degenerative changes with moderate foraminal stenosis. Mild canal  stenosis. C3-4: ACDF. Bilateral facet hypertrophy with mild  foraminal stenosis. No canal stenosis. C4-5: ACDF. Predominant left-sided facet hypertrophy with moderate left foraminal stenosis. No significant canal stenosis. C5-6: Stable disc osteophyte complex eccentric to the left with left-greater-than-right uncovertebral and facet hypertrophy. Mild left foraminal stenosis. No canal stenosis. C6-7: Stable disc osteophyte complex with bilateral uncovertebral and facet hypertrophy greater on the right. Moderate right and mild left foraminal stenosis. Mild canal stenosis. Disc contact on the anterior cord with mild cord flattening. C7-T1: Anterolisthesis with small uncovered disc bulge and predominantly right-sided uncovertebral and facet hypertrophy. Mild-to-moderate right foraminal stenosis. No canal stenosis. IMPRESSION: 1. Type 2 odontoid fracture and bilateral C1-2 lateral joint capsular injury with 4 mm posterior subluxation of the odontoid fragment/joints. 2. Normal anterior C1-2 and normal atlantooccipital articulation. 3. Mildly increased cord signal at C2 compatible with cord contusion. No findings of cord hemorrhage. 4. Partial tear of anterior and posterior longitudinal ligaments at C2. 5. Mild interspinous edema at C1-C2 indicating ligament injury. 6. Small epidural hematoma at C2 level with mild canal stenosis. No cord compression. 7. Stable cervical spine spondylosis. Electronically Signed   By: Kristine Garbe M.D.   On: 01/24/2018 20:12    Assessment/Plan: Pleasant 67 year old admitted for odontoid fracture. Doing well today. No new nsgy recom.   LOS: 2 days    Ocie Cornfield Tracie Lindbloom 01/26/2018, 8:29 AM

## 2018-01-27 ENCOUNTER — Encounter (HOSPITAL_COMMUNITY)
Admission: EM | Disposition: A | Discharge status: Home / Self Care | Payer: Self-pay | Source: Home / Self Care | Attending: Neurosurgery

## 2018-01-27 ENCOUNTER — Inpatient Hospital Stay (HOSPITAL_COMMUNITY): Payer: PPO | Admitting: Certified Registered Nurse Anesthetist

## 2018-01-27 ENCOUNTER — Inpatient Hospital Stay (HOSPITAL_COMMUNITY): Payer: PPO

## 2018-01-27 ENCOUNTER — Encounter (HOSPITAL_COMMUNITY): Payer: Self-pay | Admitting: Urology

## 2018-01-27 DIAGNOSIS — S12112A Nondisplaced Type II dens fracture, initial encounter for closed fracture: Secondary | ICD-10-CM | POA: Diagnosis present

## 2018-01-27 HISTORY — PX: ODONTOID SCREW INSERTION: SHX5704

## 2018-01-27 LAB — GLUCOSE, CAPILLARY
Glucose-Capillary: 240 mg/dL — ABNORMAL HIGH (ref 70–99)
Glucose-Capillary: 226 mg/dL — ABNORMAL HIGH (ref 70–99)
Glucose-Capillary: 233 mg/dL — ABNORMAL HIGH (ref 70–99)
Glucose-Capillary: 178 mg/dL — ABNORMAL HIGH (ref 70–99)
Glucose-Capillary: 304 mg/dL — ABNORMAL HIGH (ref 70–99)

## 2018-01-27 SURGERY — INSERTION, SCREW, ODONTOID
Anesthesia: General | Site: Neck

## 2018-01-27 MED ORDER — FENTANYL CITRATE (PF) 250 MCG/5ML IJ SOLN
INTRAMUSCULAR | Status: DC | PRN
  Administered 2018-01-27 (×3): 50 ug via INTRAVENOUS

## 2018-01-27 MED ORDER — BUPIVACAINE-EPINEPHRINE 0.5% -1:200000 IJ SOLN
INTRAMUSCULAR | Status: AC
  Filled 2018-01-27: qty 1

## 2018-01-27 MED ORDER — ACETAMINOPHEN 325 MG PO TABS
650.0000 mg | ORAL_TABLET | ORAL | Status: DC | PRN
  Administered 2018-01-28 – 2018-01-29 (×2): 650 mg via ORAL
  Filled 2018-01-27 (×2): qty 2

## 2018-01-27 MED ORDER — SUGAMMADEX SODIUM 200 MG/2ML IV SOLN
INTRAVENOUS | Status: DC | PRN
  Administered 2018-01-27: 130 mg via INTRAVENOUS

## 2018-01-27 MED ORDER — ONDANSETRON HCL 4 MG/2ML IJ SOLN
INTRAMUSCULAR | Status: AC
  Filled 2018-01-27: qty 2

## 2018-01-27 MED ORDER — CYCLOBENZAPRINE HCL 10 MG PO TABS
10.0000 mg | ORAL_TABLET | Freq: Three times a day (TID) | ORAL | Status: DC | PRN
  Administered 2018-01-28: 10 mg via ORAL
  Filled 2018-01-27: qty 1

## 2018-01-27 MED ORDER — ROCURONIUM BROMIDE 50 MG/5ML IV SOSY
PREFILLED_SYRINGE | INTRAVENOUS | Status: AC
  Filled 2018-01-27: qty 5

## 2018-01-27 MED ORDER — DEXAMETHASONE SODIUM PHOSPHATE 10 MG/ML IJ SOLN: 4.0000 mg | Freq: Four times a day (QID) | INTRAMUSCULAR | Status: DC

## 2018-01-27 MED ORDER — ONDANSETRON HCL 4 MG/2ML IJ SOLN: 4.0000 mg | Freq: Four times a day (QID) | INTRAMUSCULAR | Status: DC | PRN

## 2018-01-27 MED ORDER — BISACODYL 10 MG RE SUPP: 10.0000 mg | Freq: Every day | RECTAL | Status: DC | PRN

## 2018-01-27 MED ORDER — LIDOCAINE HCL (CARDIAC) PF 100 MG/5ML IV SOSY
PREFILLED_SYRINGE | INTRAVENOUS | Status: DC | PRN
  Administered 2018-01-27: 100 mg via INTRAVENOUS

## 2018-01-27 MED ORDER — MIDAZOLAM HCL 2 MG/2ML IJ SOLN
INTRAMUSCULAR | Status: DC | PRN
  Administered 2018-01-27 (×2): 1 mg via INTRAVENOUS

## 2018-01-27 MED ORDER — ALUM & MAG HYDROXIDE-SIMETH 200-200-20 MG/5ML PO SUSP: 30.0000 mL | Freq: Four times a day (QID) | ORAL | Status: DC | PRN

## 2018-01-27 MED ORDER — LIDOCAINE 2% (20 MG/ML) 5 ML SYRINGE
INTRAMUSCULAR | Status: AC
  Filled 2018-01-27: qty 5

## 2018-01-27 MED ORDER — PHENYLEPHRINE 40 MCG/ML (10ML) SYRINGE FOR IV PUSH (FOR BLOOD PRESSURE SUPPORT)
PREFILLED_SYRINGE | INTRAVENOUS | Status: DC | PRN
  Administered 2018-01-27: 80 ug via INTRAVENOUS
  Administered 2018-01-27 (×2): 100 ug via INTRAVENOUS

## 2018-01-27 MED ORDER — THROMBIN 5000 UNITS EX SOLR
CUTANEOUS | Status: AC
  Filled 2018-01-27: qty 5000

## 2018-01-27 MED ORDER — DEXAMETHASONE SODIUM PHOSPHATE 10 MG/ML IJ SOLN
INTRAMUSCULAR | Status: AC
  Filled 2018-01-27: qty 1

## 2018-01-27 MED ORDER — PANTOPRAZOLE SODIUM 20 MG PO TBEC
20.0000 mg | DELAYED_RELEASE_TABLET | Freq: Two times a day (BID) | ORAL | Status: DC
  Administered 2018-01-27 – 2018-01-29 (×4): 20 mg via ORAL
  Filled 2018-01-27 (×4): qty 1

## 2018-01-27 MED ORDER — ACETAMINOPHEN 650 MG RE SUPP: 650.0000 mg | RECTAL | Status: DC | PRN

## 2018-01-27 MED ORDER — LACTATED RINGERS IV SOLN
INTRAVENOUS | Status: DC
  Administered 2018-01-27 (×2): via INTRAVENOUS

## 2018-01-27 MED ORDER — MENTHOL 3 MG MT LOZG: 1.0000 | LOZENGE | OROMUCOSAL | Status: DC | PRN

## 2018-01-27 MED ORDER — DEXAMETHASONE SODIUM PHOSPHATE 10 MG/ML IJ SOLN
4.0000 mg | Freq: Four times a day (QID) | INTRAMUSCULAR | Status: AC
  Administered 2018-01-28 (×4): 4 mg via INTRAVENOUS
  Filled 2018-01-27 (×4): qty 1

## 2018-01-27 MED ORDER — SODIUM CHLORIDE 0.9 % IV SOLN
INTRAVENOUS | Status: DC | PRN
  Administered 2018-01-27: 50 ug/min via INTRAVENOUS

## 2018-01-27 MED ORDER — DEXAMETHASONE SODIUM PHOSPHATE 10 MG/ML IJ SOLN
INTRAMUSCULAR | Status: DC | PRN
  Administered 2018-01-27: 10 mg via INTRAVENOUS

## 2018-01-27 MED ORDER — DOCUSATE SODIUM 100 MG PO CAPS
100.0000 mg | ORAL_CAPSULE | Freq: Two times a day (BID) | ORAL | Status: DC
  Administered 2018-01-27 – 2018-01-29 (×4): 100 mg via ORAL
  Filled 2018-01-27 (×4): qty 1

## 2018-01-27 MED ORDER — CEFAZOLIN SODIUM 1 G IJ SOLR
INTRAMUSCULAR | Status: AC
  Filled 2018-01-27: qty 20

## 2018-01-27 MED ORDER — ZOLPIDEM TARTRATE 5 MG PO TABS: 5.0000 mg | ORAL_TABLET | Freq: Every evening | ORAL | Status: DC | PRN

## 2018-01-27 MED ORDER — DEXAMETHASONE SODIUM PHOSPHATE 4 MG/ML IJ SOLN
4.0000 mg | Freq: Four times a day (QID) | INTRAMUSCULAR | Status: AC
  Administered 2018-01-27: 4 mg via INTRAVENOUS
  Filled 2018-01-27: qty 1

## 2018-01-27 MED ORDER — ONDANSETRON HCL 4 MG/2ML IJ SOLN
INTRAMUSCULAR | Status: DC | PRN
  Administered 2018-01-27: 4 mg via INTRAVENOUS

## 2018-01-27 MED ORDER — DEXAMETHASONE 4 MG PO TABS
4.0000 mg | ORAL_TABLET | Freq: Four times a day (QID) | ORAL | Status: AC
  Filled 2018-01-27: qty 1

## 2018-01-27 MED ORDER — BACITRACIN ZINC 500 UNIT/GM EX OINT
TOPICAL_OINTMENT | CUTANEOUS | Status: DC | PRN
  Administered 2018-01-27: 1 via TOPICAL

## 2018-01-27 MED ORDER — CEFAZOLIN SODIUM-DEXTROSE 2-4 GM/100ML-% IV SOLN
2.0000 g | Freq: Three times a day (TID) | INTRAVENOUS | Status: AC
  Administered 2018-01-27 – 2018-01-28 (×2): 2 g via INTRAVENOUS
  Filled 2018-01-27 (×3): qty 100

## 2018-01-27 MED ORDER — MIDAZOLAM HCL 2 MG/2ML IJ SOLN
INTRAMUSCULAR | Status: AC
  Filled 2018-01-27: qty 2

## 2018-01-27 MED ORDER — PROPOFOL 10 MG/ML IV BOLUS
INTRAVENOUS | Status: AC
  Filled 2018-01-27: qty 20

## 2018-01-27 MED ORDER — 0.9 % SODIUM CHLORIDE (POUR BTL) OPTIME
TOPICAL | Status: DC | PRN
  Administered 2018-01-27: 1000 mL

## 2018-01-27 MED ORDER — ROCURONIUM BROMIDE 100 MG/10ML IV SOLN
INTRAVENOUS | Status: DC | PRN
  Administered 2018-01-27: 50 mg via INTRAVENOUS

## 2018-01-27 MED ORDER — FENTANYL CITRATE (PF) 100 MCG/2ML IJ SOLN
INTRAMUSCULAR | Status: AC
  Filled 2018-01-27: qty 2

## 2018-01-27 MED ORDER — ACETAMINOPHEN 500 MG PO TABS
1000.0000 mg | ORAL_TABLET | Freq: Four times a day (QID) | ORAL | Status: AC
  Administered 2018-01-27 – 2018-01-28 (×4): 1000 mg via ORAL
  Filled 2018-01-27 (×4): qty 2

## 2018-01-27 MED ORDER — BUPIVACAINE-EPINEPHRINE 0.5% -1:200000 IJ SOLN
INTRAMUSCULAR | Status: DC | PRN
  Administered 2018-01-27: 10 mL

## 2018-01-27 MED ORDER — THROMBIN 5000 UNITS EX SOLR
OROMUCOSAL | Status: DC | PRN
  Administered 2018-01-27: 17:00:00

## 2018-01-27 MED ORDER — ONDANSETRON HCL 4 MG PO TABS: 4.0000 mg | ORAL_TABLET | Freq: Four times a day (QID) | ORAL | Status: DC | PRN

## 2018-01-27 MED ORDER — CEFAZOLIN SODIUM-DEXTROSE 2-3 GM-%(50ML) IV SOLR
INTRAVENOUS | Status: DC | PRN
  Administered 2018-01-27: 2 g via INTRAVENOUS

## 2018-01-27 MED ORDER — SODIUM CHLORIDE 0.9 % IV SOLN
INTRAVENOUS | Status: DC | PRN
  Administered 2018-01-27: 500 mL

## 2018-01-27 MED ORDER — HYDROMORPHONE HCL 1 MG/ML IJ SOLN: 0.2500 mg | INTRAMUSCULAR | Status: DC | PRN

## 2018-01-27 MED ORDER — FENTANYL CITRATE (PF) 250 MCG/5ML IJ SOLN
INTRAMUSCULAR | Status: AC
  Filled 2018-01-27: qty 5

## 2018-01-27 MED ORDER — BACITRACIN ZINC 500 UNIT/GM EX OINT
TOPICAL_OINTMENT | CUTANEOUS | Status: AC
  Filled 2018-01-27: qty 28.35

## 2018-01-27 MED ORDER — ONDANSETRON HCL 4 MG/2ML IJ SOLN: 4.0000 mg | Freq: Once | INTRAMUSCULAR | Status: DC | PRN

## 2018-01-27 MED ORDER — MEPERIDINE HCL 50 MG/ML IJ SOLN: 6.2500 mg | INTRAMUSCULAR | Status: DC | PRN

## 2018-01-27 MED ORDER — LACTATED RINGERS IV SOLN: INTRAVENOUS | Status: DC

## 2018-01-27 MED ORDER — PHENOL 1.4 % MT LIQD: 1.0000 | OROMUCOSAL | Status: DC | PRN

## 2018-01-27 MED ORDER — PROPOFOL 10 MG/ML IV BOLUS
INTRAVENOUS | Status: DC | PRN
  Administered 2018-01-27: 150 mg via INTRAVENOUS

## 2018-01-27 SURGICAL SUPPLY — 56 items
BENZOIN TINCTURE PRP APPL 2/3 (GAUZE/BANDAGES/DRESSINGS) ×2 IMPLANT
BIT DRILL UCSC CANN STRL (BIT) ×1 IMPLANT
BLADE CLIPPER SURG (BLADE) IMPLANT
CANISTER SUCT 3000ML PPV (MISCELLANEOUS) ×2 IMPLANT
CARTRIDGE OIL MAESTRO DRILL (MISCELLANEOUS) ×1 IMPLANT
COVER WAND RF STERILE (DRAPES) ×2 IMPLANT
DIFFUSER DRILL AIR PNEUMATIC (MISCELLANEOUS) ×2 IMPLANT
DRAPE C-ARM 42X72 X-RAY (DRAPES) ×2 IMPLANT
DRAPE HALF SHEET 40X57 (DRAPES) IMPLANT
DRAPE LAPAROTOMY 100X72 PEDS (DRAPES) ×2 IMPLANT
DRAPE MICROSCOPE LEICA (MISCELLANEOUS) IMPLANT
DRAPE POUCH INSTRU U-SHP 10X18 (DRAPES) ×2 IMPLANT
DRAPE SURG 17X23 STRL (DRAPES) ×8 IMPLANT
DRILL BIT UCSC CANN STERILE (BIT) ×1
DRSG OPSITE POSTOP 4X6 (GAUZE/BANDAGES/DRESSINGS) ×2 IMPLANT
ELECT BLADE 4.0 EZ CLEAN MEGAD (MISCELLANEOUS) ×2
ELECT REM PT RETURN 9FT ADLT (ELECTROSURGICAL) ×2
ELECTRODE BLDE 4.0 EZ CLN MEGD (MISCELLANEOUS) ×1 IMPLANT
ELECTRODE REM PT RTRN 9FT ADLT (ELECTROSURGICAL) ×1 IMPLANT
GAUZE 4X4 16PLY RFD (DISPOSABLE) IMPLANT
GAUZE SPONGE 4X4 12PLY STRL (GAUZE/BANDAGES/DRESSINGS) IMPLANT
GLOVE BIO SURGEON STRL SZ 6.5 (GLOVE) ×2 IMPLANT
GLOVE BIO SURGEON STRL SZ8 (GLOVE) ×2 IMPLANT
GLOVE BIO SURGEON STRL SZ8.5 (GLOVE) ×4 IMPLANT
GLOVE BIOGEL PI IND STRL 6.5 (GLOVE) ×1 IMPLANT
GLOVE BIOGEL PI IND STRL 8 (GLOVE) ×2 IMPLANT
GLOVE BIOGEL PI INDICATOR 6.5 (GLOVE) ×1
GLOVE BIOGEL PI INDICATOR 8 (GLOVE) ×2
GLOVE ECLIPSE 7.5 STRL STRAW (GLOVE) ×6 IMPLANT
GLOVE EXAM NITRILE LRG STRL (GLOVE) IMPLANT
GLOVE EXAM NITRILE XL STR (GLOVE) IMPLANT
GLOVE EXAM NITRILE XS STR PU (GLOVE) IMPLANT
GLOVE SURG SS PI 7.5 STRL IVOR (GLOVE) ×4 IMPLANT
GOWN STRL REUS W/ TWL LRG LVL3 (GOWN DISPOSABLE) ×2 IMPLANT
GOWN STRL REUS W/ TWL XL LVL3 (GOWN DISPOSABLE) ×1 IMPLANT
GOWN STRL REUS W/TWL 2XL LVL3 (GOWN DISPOSABLE) ×2 IMPLANT
GOWN STRL REUS W/TWL LRG LVL3 (GOWN DISPOSABLE) ×2
GOWN STRL REUS W/TWL XL LVL3 (GOWN DISPOSABLE) ×1
GUIDEWIRE THREADED 720MM (WIRE) ×2 IMPLANT
KIT BASIN OR (CUSTOM PROCEDURE TRAY) ×2 IMPLANT
KIT TURNOVER KIT B (KITS) ×2 IMPLANT
NEEDLE HYPO 25X1 1.5 SAFETY (NEEDLE) ×2 IMPLANT
NS IRRIG 1000ML POUR BTL (IV SOLUTION) ×2 IMPLANT
OIL CARTRIDGE MAESTRO DRILL (MISCELLANEOUS) ×2
PACK LAMINECTOMY NEURO (CUSTOM PROCEDURE TRAY) ×2 IMPLANT
RUBBERBAND STERILE (MISCELLANEOUS) IMPLANT
SCREW 38MM (Screw) ×2 IMPLANT
SPONGE INTESTINAL PEANUT (DISPOSABLE) ×4 IMPLANT
SPONGE SURGIFOAM ABS GEL SZ50 (HEMOSTASIS) ×2 IMPLANT
STRIP CLOSURE SKIN 1/2X4 (GAUZE/BANDAGES/DRESSINGS) ×2 IMPLANT
SUT VIC AB 0 CT1 18XCR BRD8 (SUTURE) ×1 IMPLANT
SUT VIC AB 0 CT1 8-18 (SUTURE) ×1
SUT VIC AB 3-0 SH 8-18 (SUTURE) ×2 IMPLANT
TOWEL GREEN STERILE (TOWEL DISPOSABLE) ×2 IMPLANT
TOWEL GREEN STERILE FF (TOWEL DISPOSABLE) ×2 IMPLANT
WATER STERILE IRR 1000ML POUR (IV SOLUTION) ×2 IMPLANT

## 2018-01-27 NOTE — Anesthesia Preprocedure Evaluation (Addendum)
Anesthesia Evaluation  Patient identified by MRN, date of birth, ID band Patient awake    Reviewed: Allergy & Precautions, NPO status , Patient's Chart, lab work & pertinent test results  History of Anesthesia Complications (+) PONV  Airway Mallampati: III     Mouth opening: Limited Mouth Opening Comment: In Collar Dental   Pulmonary asthma ,    Pulmonary exam normal        Cardiovascular hypertension, Pt. on home beta blockers Normal cardiovascular exam     Neuro/Psych Odontoid fx    GI/Hepatic Neg liver ROS, GERD  Medicated and Controlled,  Endo/Other  diabetes, Type 2  Renal/GU negative Renal ROS     Musculoskeletal  (+) Arthritis ,   Abdominal   Peds  Hematology negative hematology ROS (+)   Anesthesia Other Findings   Reproductive/Obstetrics                            Lab Results  Component Value Date   WBC 15.6 (H) 01/24/2018   HGB 12.9 01/24/2018   HCT 38.7 01/24/2018   MCV 92.8 01/24/2018   PLT 234 01/24/2018   Lab Results  Component Value Date   CREATININE 0.68 01/24/2018   BUN 13 01/24/2018   NA 132 (L) 01/24/2018   K 3.9 01/24/2018   CL 96 (L) 01/24/2018   CO2 21 (L) 01/24/2018    Anesthesia Physical Anesthesia Plan  ASA: III  Anesthesia Plan: General   Post-op Pain Management:    Induction: Intravenous  PONV Risk Score and Plan: 3 and Dexamethasone, Ondansetron and Treatment may vary due to age or medical condition  Airway Management Planned: Oral ETT and Video Laryngoscope Planned  Additional Equipment:   Intra-op Plan:   Post-operative Plan: Extubation in OR and Possible Post-op intubation/ventilation  Informed Consent: I have reviewed the patients History and Physical, chart, labs and discussed the procedure including the risks, benefits and alternatives for the proposed anesthesia with the patient or authorized representative who has indicated  his/her understanding and acceptance.     Plan Discussed with: CRNA and Surgeon  Anesthesia Plan Comments:        Anesthesia Quick Evaluation

## 2018-01-27 NOTE — Op Note (Signed)
Brief history: The patient is a 67 year old white female on whom I previously performed a C3-4 and C4-5 anterior cervical discectomy, fusion and plating.  She took a fall suffering a type II odontoid fracture with a spinal cord injury.  I discussed the various treatment options with the patient and recommended an anterior odontoid screw fixation of her type II odontoid fracture.  The patient has weighed the risks, benefits, and alternatives surgery and decided proceed with the operation.  Preop diagnosis: Type II odontoid fracture  Postop diagnosis: The same  Procedure: Anterior odontoid screw fixation of type II odontoid fracture  Surgeon: Dr. Earle Gell  Assistant: Arnetha Massy nurse practitioner  Anesthesia: General endotracheal  Estimated blood loss: Minimal  Specimens: None  Drains: None  Complications: None  Description of procedure: The patient was brought to the operating room by the anesthesia team.  General endotracheal anesthesia was induced.  Patient's neck was kept in the neutral position on the jelly roll.  The position was checked by both AP and lateral intraoperative fluoroscopy.  The patient's anterior cervical region was then prepared with Betadine scrub and Betadine solution.  Sterile drapes were applied.  I then injected the area to be incised with Marcaine with epinephrine solution.  Ice to scalpel to make a right transverse incision in the patient's anterior neck.  I used electrocautery to divide the platysma muscle.  I then dissected medial to the sternocleidomastoid muscle jugular vein and carotid artery.  Identified the esophagus.  I used a Kitner swab stick.  Soft tissue from the old anterior cervical plate at L4-1 and C3-0.  Using biplanar fluoroscopy I inserted a K wire through the patient's C2 vertebral body into the odontoid.  Then drilled over the K wire, tapped over the K wire and inserted a 38 mm lag screw into the odontoid.  Position was confirmed with  both AP and lateral fluoroscopy.  We obtained hemostasis using bipolar cautery.  We irrigated the wound that with bacitracin solution.  I inspected the esophagus for any damage that was not apparent.  We then reapproximated the patient's platysma muscle with interrupted 3-0 Vicryl suture.  We reapproximated the subcutaneous tissue with interrupted 3-0 Vicryl suture.  We reapproximated skin with Steri-Strips and benzoin.  The wound was then coated with bacitracin ointment.  A sterile dressing was applied.  The drapes were removed.  By report all sponge, instrument, and needle counts were correct at the end this case.

## 2018-01-27 NOTE — Progress Notes (Signed)
Inpatient Diabetes Program Recommendations  AACE/ADA: New Consensus Statement on Inpatient Glycemic Control (2015)  Target Ranges:  Prepandial:   less than 140 mg/dL      Peak postprandial:   less than 180 mg/dL (1-2 hours)      Critically ill patients:  140 - 180 mg/dL   Lab Results  Component Value Date   GLUCAP 226 (H) 01/27/2018    Review of Glycemic Control Results for TAYLYN, BRAME (MRN [id number]) as of 01/27/2018 12:23  Ref. Range 01/26/2018 08:29 01/26/2018 12:08 01/26/2018 16:38 01/27/2018 07:38  Glucose-Capillary Latest Ref Range: 70 - 99 mg/dL 242 (H) 240 (H) 262 (H) 226 (H)   Diabetes history: Type 2 DM Outpatient Diabetes medications: NPH 10 units QHS Current orders for Inpatient glycemic control: NPH 10 units QHS Decadron 10 mg Q6H, Prednisone 5 mg QAM  Inpatient Diabetes Program Recommendations:    In the setting of steroids, would recommend: - Increasing NPH 8 units BID. - Adding Novolog 0-9 units TID and Novolog 0-5 units QHS correction.  - When patient resumes eating: consider carb modified diet and Novolog 3 units TID (assuming patient is consuming >50% of meal).   Will need further adjustment once steroids are tapered.   Thanks, Bronson Curb, MSN, RNC-OB Diabetes Coordinator [phone] (8a-5p)

## 2018-01-27 NOTE — Progress Notes (Signed)
Subjective: The patient is alert and pleasant.  Her husband and son are at the bedside.  She complains of neck pain and hand weakness and numbness.  Objective: Vital signs in last 24 hours: Temp:  [97.9 F (36.6 C)-98.7 F (37.1 C)] 97.9 F (36.6 C) (10/28 1100) Pulse Rate:  [30-114] 30 (10/28 1300) Resp:  [15-18] 15 (10/27 2336) BP: (137-157)/(73-82) 137/76 (10/28 1100) SpO2:  [89 %-98 %] 89 % (10/28 1300) Estimated body mass index is 28.74 kg/m as calculated from the following:   Height as of this encounter: 4\' 10"  (1.473 m).   Weight as of this encounter: 62.4 kg.   Intake/Output from previous day: 10/27 0701 - 10/28 0700 In: 480 [P.O.:480] Out: 1550 [Urine:1550] Intake/Output this shift: Total I/O In: 240 [P.O.:240] Out: 1025 [Urine:1025]  Physical exam the patient is alert and oriented x3.  She is moving all 4 extremities.  She is weak in her upper extremities at 4+/5, particularly in her hands.  Lab Results: Recent Labs    01/24/18 1829  WBC 15.6*  HGB 12.9  HCT 38.7  PLT 234   BMET Recent Labs    01/24/18 1829  NA 132*  K 3.9  CL 96*  CO2 21*  GLUCOSE 119*  BUN 13  CREATININE 0.68  CALCIUM 9.4    Studies/Results: No results found.  Assessment/Plan: Type II odontoid fracture, cervical spinal cord injury: I discussed the situation with the patient and her family.  We have discussed the various treatment options including surgery.  I have told him that typically at her age a type II odontoid fracture will not heal without surgery.  I recommended a anterior odontoid screw fixation of her type II odontoid fracture.  I have described that surgery to them.  We have discussed the risks of surgery including risk of anesthesia, hemorrhage, infection, injury to the various neck structures, screw malplacement, malfunction, failure, medical risk, etc.  We have discussed the risks, benefits, alternatives, expected postoperative course, and the likelihood of achieving  our goals with surgery.  I discussed the alternative treatment option of doing nothing and continue with a collar indefinitely.  I have answered all the questions.  She has decided to proceed with surgery.  LOS: 3 days     Ophelia Charter 01/27/2018, 3:52 PM

## 2018-01-27 NOTE — Progress Notes (Signed)
Subjective: The patient is somnolent but easily arousable.  She looks well.  She is in no apparent distress.  Objective: Vital signs in last 24 hours: Temp:  [97.7 F (36.5 C)-98.7 F (37.1 C)] (P) 97.7 F (36.5 C) (10/28 1804) Pulse Rate:  [30-114] (P) 90 (10/28 1804) Resp:  [15-18] (P) 16 (10/28 1804) BP: (137-157)/(73-82) (P) 159/70 (10/28 1804) SpO2:  [89 %-98 %] (P) 94 % (10/28 1804) Estimated body mass index is 28.74 kg/m as calculated from the following:   Height as of this encounter: 4\' 10"  (1.473 m).   Weight as of this encounter: 62.4 kg.   Intake/Output from previous day: 10/27 0701 - 10/28 0700 In: 480 [P.O.:480] Out: 1550 [Urine:1550] Intake/Output this shift: Total I/O In: 1340 [P.O.:240; I.V.:1100] Out: 1035 [Urine:1025; Blood:10]  Physical exam patient is somnolent but arousable.  She is moving all 4 extremities well.  Her dressing is clean and dry.  There is no hematoma or shift.  Lab Results: Recent Labs    01/24/18 1829  WBC 15.6*  HGB 12.9  HCT 38.7  PLT 234   BMET Recent Labs    01/24/18 1829  NA 132*  K 3.9  CL 96*  CO2 21*  GLUCOSE 119*  BUN 13  CREATININE 0.68  CALCIUM 9.4    Studies/Results: No results found.  Assessment/Plan: The patient is doing well.  LOS: 3 days     Ophelia Charter 01/27/2018, 6:10 PM

## 2018-01-27 NOTE — Anesthesia Procedure Notes (Signed)
Procedure Name: Intubation Date/Time: 01/27/2018 4:10 PM Performed by: Raenette Rover, CRNA Pre-anesthesia Checklist: Patient identified, Emergency Drugs available, Suction available and Patient being monitored Patient Re-evaluated:Patient Re-evaluated prior to induction Oxygen Delivery Method: Circle system utilized Preoxygenation: Pre-oxygenation with 100% oxygen Induction Type: IV induction Ventilation: Mask ventilation without difficulty Laryngoscope Size: Glidescope and 3 Grade View: Grade I Tube type: Oral Tube size: 7.0 mm Number of attempts: 1 Airway Equipment and Method: Video-laryngoscopy Placement Confirmation: ETT inserted through vocal cords under direct vision,  positive ETCO2,  CO2 detector and breath sounds checked- equal and bilateral Secured at: 21 cm Tube secured with: Tape Dental Injury: Teeth and Oropharynx as per pre-operative assessment  Comments: C-collar remains in place for induction, DL, and intubation. Neck maintained in neutral and midline alignment. MDA and surgeon at bedside and agree that neck was maintained in C-collar neutral alignment during induction, DL, and intubation. Ladora Daniel CRNA

## 2018-01-27 NOTE — Transfer of Care (Signed)
Immediate Anesthesia Transfer of Care Note  Patient: Tonya Hall  Procedure(s) Performed: ODONTOID SCREW INSERTION (N/A Neck)  Patient Location: PACU  Anesthesia Type:General  Level of Consciousness: awake and patient cooperative  Airway & Oxygen Therapy: Patient Spontanous Breathing  Post-op Assessment: Report given to RN and Post -op Vital signs reviewed and stable  Post vital signs: Reviewed and stable  Last Vitals:  Vitals Value Taken Time  BP    Temp    Pulse 89 01/27/2018  6:04 PM  Resp 13 01/27/2018  6:04 PM  SpO2 93 % 01/27/2018  6:04 PM  Vitals shown include unvalidated device data.  Last Pain:  Vitals:   01/27/18 1200  TempSrc:   PainSc: 8       Patients Stated Pain Goal: 3 (47/09/29 5747)  Complications: No apparent anesthesia complications

## 2018-01-27 NOTE — Care Management Important Message (Signed)
Important Message  Patient Details  Name: Tonya Hall MRN: [id number] Date of Birth: 26-May-1950   Medicare Important Message Given:  Yes    Kenzlie Disch Montine Circle 01/27/2018, 3:04 PM

## 2018-01-28 ENCOUNTER — Encounter (HOSPITAL_COMMUNITY): Payer: Self-pay | Admitting: Neurosurgery

## 2018-01-28 LAB — GLUCOSE, CAPILLARY
Glucose-Capillary: 205 mg/dL — ABNORMAL HIGH (ref 70–99)
Glucose-Capillary: 272 mg/dL — ABNORMAL HIGH (ref 70–99)
Glucose-Capillary: 261 mg/dL — ABNORMAL HIGH (ref 70–99)
Glucose-Capillary: 231 mg/dL — ABNORMAL HIGH (ref 70–99)

## 2018-01-28 MED ORDER — SENNA 8.6 MG PO TABS
1.0000 | ORAL_TABLET | Freq: Two times a day (BID) | ORAL | Status: DC
  Administered 2018-01-28 – 2018-01-29 (×3): 8.6 mg via ORAL
  Filled 2018-01-28 (×3): qty 1

## 2018-01-28 MED ORDER — POLYETHYLENE GLYCOL 3350 17 G PO PACK
17.0000 g | PACK | Freq: Two times a day (BID) | ORAL | Status: DC
  Administered 2018-01-28 (×2): 17 g via ORAL
  Filled 2018-01-28 (×3): qty 1

## 2018-01-28 NOTE — Progress Notes (Signed)
Subjective: The patient is alert and pleasant.  She wants to go home.  Her husband is at the bedside.  She looks well.  Objective: Vital signs in last 24 hours: Temp:  [97.7 F (36.5 C)-98.4 F (36.9 C)] 98.1 F (36.7 C) (10/29 6387) Pulse Rate:  [30-99] 89 (10/29 0812) Resp:  [12-27] 20 (10/28 2019) BP: (117-162)/(61-94) 147/70 (10/29 0812) SpO2:  [89 %-98 %] 96 % (10/29 0812) FiO2 (%):  [2 %] 2 % (10/28 1853) Estimated body mass index is 28.74 kg/m as calculated from the following:   Height as of this encounter: 4\' 10"  (1.473 m).   Weight as of this encounter: 62.4 kg.   Intake/Output from previous day: 10/28 0701 - 10/29 0700 In: 5643 [P.O.:240; I.V.:1100] Out: 1635 [Urine:1625; Blood:10] Intake/Output this shift: No intake/output data recorded.  Physical exam the patient is alert and oriented.  She is moving all 4 extremities well.  Her dressing is clean and dry.  There is no hematoma or shift.  Lab Results: No results for input(s): WBC, HGB, HCT, PLT in the last 72 hours. BMET No results for input(s): NA, K, CL, CO2, GLUCOSE, BUN, CREATININE, CALCIUM in the last 72 hours.  Studies/Results: Dg Cervical Spine 2-3 Views  Result Date: 01/27/2018 CLINICAL DATA:  Odontoid screw insertion. EXAM: DG C-ARM 61-120 MIN; CERVICAL SPINE - 2-3 VIEW COMPARISON:  CT 01/24/2018 and plain films 11/11/2017 FINDINGS: Examination demonstrates evidence of patient's anterior fusion hardware from C3-C5 unchanged and intact. There has been placement of a odontoid screw extending from inferior to superior bridging patient's known fracture site. Hardware is intact with anatomic alignment about the fracture site. Remainder of the exam is unchanged. Recommend correlation with findings at the time of the procedure. IMPRESSION: Placement of and on toy screw bridging patient's known C2 fracture with hardware intact and anatomic alignment about the fracture site. Anterior fusion hardware from C3-C5 intact  and unchanged. Electronically Signed   By: Marin Olp M.D.   On: 01/27/2018 19:28   Dg C-arm 1-60 Min  Result Date: 01/27/2018 CLINICAL DATA:  Odontoid screw insertion. EXAM: DG C-ARM 61-120 MIN; CERVICAL SPINE - 2-3 VIEW COMPARISON:  CT 01/24/2018 and plain films 11/11/2017 FINDINGS: Examination demonstrates evidence of patient's anterior fusion hardware from C3-C5 unchanged and intact. There has been placement of a odontoid screw extending from inferior to superior bridging patient's known fracture site. Hardware is intact with anatomic alignment about the fracture site. Remainder of the exam is unchanged. Recommend correlation with findings at the time of the procedure. IMPRESSION: Placement of and on toy screw bridging patient's known C2 fracture with hardware intact and anatomic alignment about the fracture site. Anterior fusion hardware from C3-C5 intact and unchanged. Electronically Signed   By: Marin Olp M.D.   On: 01/27/2018 19:28   Dg C-arm 1-60 Min  Result Date: 01/27/2018 CLINICAL DATA:  Odontoid screw insertion. EXAM: DG C-ARM 61-120 MIN; CERVICAL SPINE - 2-3 VIEW COMPARISON:  CT 01/24/2018 and plain films 11/11/2017 FINDINGS: Examination demonstrates evidence of patient's anterior fusion hardware from C3-C5 unchanged and intact. There has been placement of a odontoid screw extending from inferior to superior bridging patient's known fracture site. Hardware is intact with anatomic alignment about the fracture site. Remainder of the exam is unchanged. Recommend correlation with findings at the time of the procedure. IMPRESSION: Placement of and on toy screw bridging patient's known C2 fracture with hardware intact and anatomic alignment about the fracture site. Anterior fusion hardware from C3-C5 intact  and unchanged. Electronically Signed   By: Marin Olp M.D.   On: 01/27/2018 19:28    Assessment/Plan: Postop day #1: Patient is doing well.  She still a bit unsteady because of her  spinal cord injury.  PT recommends another day of therapy.  We will tentatively plan to send her home tomorrow.  I have answered all their questions.  I gave them discharge instructions.  LOS: 4 days     Ophelia Charter 01/28/2018, 8:16 AM

## 2018-01-28 NOTE — Anesthesia Postprocedure Evaluation (Signed)
Anesthesia Post Note  Patient: Tonya Hall  Procedure(s) Performed: ODONTOID SCREW INSERTION (N/A Neck)     Patient location during evaluation: PACU Anesthesia Type: General Level of consciousness: awake and alert Pain management: pain level controlled Vital Signs Assessment: post-procedure vital signs reviewed and stable Respiratory status: spontaneous breathing, nonlabored ventilation, respiratory function stable and patient connected to nasal cannula oxygen Cardiovascular status: blood pressure returned to baseline and stable Postop Assessment: no apparent nausea or vomiting Anesthetic complications: no    Last Vitals:  Vitals:   01/27/18 2300 01/28/18 0300  BP: 135/61 120/61  Pulse: 96 88  Resp:    Temp: 36.7 C 36.7 C  SpO2: 93% 93%    Last Pain:  Vitals:   01/28/18 0300  TempSrc: Oral  PainSc:                  [place] DAVID

## 2018-01-28 NOTE — Progress Notes (Signed)
OT Evaluation  PTA, pt lived t home with her husband and was independent with ADL and mobility. Pt presents with apparent neurapraxia BUE (L worse than R), decreasing her independence with ADL and mobility. Began education regarding compensatory techniquse for ADL and use of adaptive aids. Began in-hand coordination and strengthening HEP. Contacted NP to get clarification orders on cervical collar. Will follow acutley and recommend HHOT for follow up .     01/28/18 1100  OT Visit Information  Last OT Received On 01/28/18  Assistance Needed +1  History of Present Illness 67 year old female whose had a fall landing on her face the in the prone position. Pt found to have a fx odontoid. Pt now s/p anterior fixation of type II odontoid fx.   PMH: DM, HTN, Degenreative dsk disease, and psoriatic arthritis. PSH: sever back and cervical surgeries.  Precautions  Precautions Cervical  Precaution Booklet Issued Yes (comment)  Precaution Comments pt and spouse with good verbal understanding  Required Braces or Orthoses Cervical Brace  Cervical Brace Hard collar (asked for clarification orders)  Restrictions  Weight Bearing Restrictions No  Other Position/Activity Restrictions cervical lifting precautions  Home Living  Family/patient expects to be discharged to: Private residence  Living Arrangements Spouse/significant other  Available Help at Discharge Family;Available 24 hours/day  Type of Home House  Home Access Stairs to enter  Entrance Stairs-Number of Steps 3  Entrance Stairs-Rails Can reach both  Home Layout Two level;Able to live on main level with bedroom/bathroom  Engineer, manufacturing systems Yes  How Accessible Accessible via walker  [place] - 2 wheels;Shower seat;BSC;Grab bars - tub/shower;Hand held shower head;Journalist, newspaper  Additional Comments drives, very active; handicapped  accessible  Prior Function  Level of Independence Independent  Comments difficulty wtih in-hand manipulation skills PTA  Communication  Communication No difficulties  Pain Assessment  Pain Assessment 0-10  Pain Score 2  Pain Location body in general, back and arthritic pain, denies pan in neck  Pain Descriptors / Indicators Sore  Pain Intervention(s) Limited activity within patient's tolerance  Cognition  Arousal/Alertness Awake/alert  Behavior During Therapy WFL for tasks assessed/performed  Overall Cognitive Status Within Functional Limits for tasks assessed  Upper Extremity Assessment  Upper Extremity Assessment RUE deficits/detail;LUE deficits/detail  RUE Deficits / Details grossly 3/5, noted impaired sensation in finger tips  RUE Sensation decreased light touch  RUE Coordination decreased gross motor;decreased fine motor (fine motor limited by arthritis in hand; neurapraxia)  LUE Deficits / Details grossly 3/5, noted impaired sensation in finger tips  LUE Sensation decreased light touch (hand 98%, finger tips 90%)  LUE Coordination decreased fine motor;decreased gross motor (fine motor from arthritis; neurapraxia; more impaired than R)  Lower Extremity Assessment  Lower Extremity Assessment Defer to PT evaluation  Cervical / Trunk Assessment  Cervical / Trunk Assessment Other exceptions  Cervical / Trunk Exceptions multiple back and neck surgery, post op day 1 for anterior cervical procedure  ADL  Overall ADL's  Needs assistance/impaired  Eating/Feeding Set up;Sitting  Eating/Feeding Details (indicate cue type and reason) difficulty with holding utensils  Grooming Moderate assistance  Upper Body Bathing Minimal assistance;Sitting  Lower Body Bathing Minimal assistance;Sit to/from stand  Upper Body Dressing  Minimal assistance;Sitting  Lower Body Dressing Minimal assistance;Sit to/from stand  Lower Body Dressing Details (indicate cue type and reason) Able to complete  figure 4 position  Toilet Transfer Min guard;Ambulation;Comfort height toilet;Grab bars  Toileting- Clothing Manipulation and Hygiene Minimal assistance;Sit to/from stand  Functional mobility during ADLs Min guard  General ADL Comments Issued built up tubing to assist with grooming and holding utnensils  Bed Mobility  General bed mobility comments OOB in chair  Transfers  Overall transfer level Needs assistance  Equipment used None  Transfers Sit to/from [employer] Transfers  Sit to Stand Min guard  Stand pivot transfers Min guard  General transfer comment mild unsteadiness  Balance  Overall balance assessment Needs assistance  Sitting-balance support Feet supported;No upper extremity supported  Sitting balance-Leahy Scale Good  Standing balance support Single extremity supported  Standing balance-Leahy Scale Fair  Standing balance comment stood at sink to complete ADL  Exercises  Exercises Other exercises  Other Exercises  Other Exercises level 1 theraputty activities  OT - End of Session  Equipment Utilized During Treatment Cervical collar  Activity Tolerance Patient tolerated treatment well  Patient left Other (comment) (husband present; on toilet  - nsg aware)  Nurse Communication Mobility status;Other (comment) (need for clarification orders)  OT Assessment  OT Recommendation/Assessment Patient needs continued OT Services  OT Visit Diagnosis Other abnormalities of gait and mobility (R26.89);Muscle weakness (generalized) (M62.81);Ataxia, unspecified (R27.0);Pain  Pain - part of body  (general discomfort)  OT Problem List Decreased strength;Decreased range of motion;Impaired balance (sitting and/or standing);Decreased coordination;Decreased safety awareness;Decreased knowledge of use of DME or AE;Decreased knowledge of precautions;Impaired sensation;Impaired tone;Pain;Impaired UE functional use  OT Plan  OT Frequency (ACUTE ONLY) Min 3X/week  OT  Treatment/Interventions (ACUTE ONLY) Self-care/ADL training;Therapeutic exercise;DME and/or AE instruction;Therapeutic activities;Patient/family education;Balance training;Neuromuscular education  AM-PAC OT "6 Clicks" Daily Activity Outcome Measure  Help from another person eating meals? 3  Help from another person taking care of personal grooming? 3  Help from another person toileting, which includes using toliet, bedpan, or urinal? 3  Help from another person bathing (including washing, rinsing, drying)? 3  Help from another person to put on and taking off regular upper body clothing? 3  Help from another person to put on and taking off regular lower body clothing? 3  6 Click Score 18  ADL G Code Conversion CK  OT Recommendation  Follow Up Recommendations Home health OT;Supervision/Assistance - 24 hour  OT Equipment None recommended by OT  Individuals Consulted  Consulted and Agree with Results and Recommendations Patient;Family member/caregiver  Family Member Consulted husband  Acute Rehab OT Goals  Patient Stated Goal go home tomorrow  OT Goal Formulation With patient  Time For Goal Achievement 02/11/18  Potential to Achieve Goals Good  OT Time Calculation  OT Start Time (ACUTE ONLY) 1030  OT Stop Time (ACUTE ONLY) 1058  OT Time Calculation (min) 28 min  OT General Charges  $OT Visit 1 Visit  OT Evaluation  $OT Eval Moderate Complexity 1 Mod  OT Treatments  $Self Care/Home Management  8-22 mins  Written Expression  Dominant Hand Right  Maurie Boettcher, OT/L   Acute OT Clinical Specialist [place] Pager [phone] Office [phone]

## 2018-01-28 NOTE — Evaluation (Signed)
Physical Therapy Evaluation Patient Details Name: Tonya Hall MRN: [id number] DOB: [birth year] Today's Date: 01/28/2018   History of Present Illness  67 year old female whose had a fall landing on her face the in the prone position earlier today initially she couldn't move her arms or legs felt numb in her arms or legs. Slowly her LEs and UEs started coming back. Pt found to have a fx odontoid. Pt now s/p anterior fixation of type II odontoid fx.   PMH: DM, HTN, Degenreative dsk disease, and psoriatic arthritis. PSH: sever back and cervical surgeries.  Clinical Impression  Pt doing very well. Pt with no pain at surgical site and is able to mobilize and ambulate with minA at this time. Pt presenting with impaired co-ordination of bilat LEs and UEs. Pt had this deficit prior to the fall but it has now worsened. Pt also with impaired sequencing of LEs during ambulation. Pt cont to have numbness in fingertips as well. Acute PT to cont to follow to progress towards independence.    Follow Up Recommendations Home health PT;Supervision/Assistance - 24 hour    Equipment Recommendations  None recommended by PT    Recommendations for Other Services       Precautions / Restrictions Precautions Precautions: Cervical Precaution Booklet Issued: Yes (comment) Precaution Comments: pt and spouse with good verbal understanding Required Braces or Orthoses: Cervical Brace Cervical Brace: Hard collar Restrictions Weight Bearing Restrictions: No Other Position/Activity Restrictions: cervical lifting precautions      Mobility  Bed Mobility Overal bed mobility: Needs Assistance Bed Mobility: Rolling;Sidelying to Sit Rolling: Supervision Sidelying to sit: Min assist       General bed mobility comments: minA to get up onto elbow, no rail used to mimic home set up, increased time, miinA for trunk elevation  Transfers Overall transfer level: Needs assistance Equipment used: None Transfers: Sit  to/from [facility] Sit to Stand: Min assist Stand pivot transfers: Min assist       General transfer comment: pt slow and guarded, pt able to complete std pvt to [facility] and back to bed  Ambulation/Gait Ambulation/Gait assistance: Min assist Gait Distance (Feet): 120 Feet Assistive device: 1 person hand held assist Gait Pattern/deviations: Step-through pattern;Decreased stride length Gait velocity: slow Gait velocity interpretation: <1.31 ft/sec, indicative of household ambulator General Gait Details: pt reports "it feels wierd walking. Like my legs arent bending properly"  Stairs            Wheelchair Mobility    Modified Rankin (Stroke Patients Only)       Balance Overall balance assessment: Needs assistance Sitting-balance support: Feet supported;No upper extremity supported Sitting balance-Leahy Scale: Good     Standing balance support: Single extremity supported Standing balance-Leahy Scale: Fair Standing balance comment: pt stood at [facility] and preformed peri-care with 1 hand on BSC.                             Pertinent Vitals/Pain Pain Assessment: 0-10 Pain Score: 3  Pain Location: body in general, back and arthritic pain, denies pan in neck Pain Descriptors / Indicators: Sore Pain Intervention(s): Monitored during session    Home Living Family/patient expects to be discharged to:: Private residence Living Arrangements: Spouse/significant other Available Help at Discharge: Family;Available 24 hours/day Type of Home: House Home Access: Stairs to enter Entrance Stairs-Rails: Can reach both Entrance Stairs-Number of Steps: 3 Home Layout: Two level;Able to live on main level with bedroom/bathroom Home  Equipment: Gilford Rile - 2 wheels;Shower seat;Bedside commode Additional Comments: drives, very active    Prior Function Level of Independence: Independent               Hand Dominance   Dominant Hand: Right     Extremity/Trunk Assessment   Upper Extremity Assessment Upper Extremity Assessment: RUE deficits/detail;LUE deficits/detail RUE Deficits / Details: grossly 3/5, noted impaired sensation in finger tips RUE Sensation: decreased light touch(at finger tips, 90%, hand at 98%) RUE Coordination: decreased gross motor;decreased fine motor(fine motor limited by arthritis in hand) LUE Deficits / Details: grossly 3/5, noted impaired sensation in finger tips LUE Sensation: decreased light touch(hand 98%, finger tips 90%) LUE Coordination: decreased fine motor;decreased gross motor(fine motor from arthritis)    Lower Extremity Assessment Lower Extremity Assessment: (grossly 4/5, during amb pt reports my legs arent bending )    Cervical / Trunk Assessment Cervical / Trunk Assessment: Other exceptions Cervical / Trunk Exceptions: multiple back and neck surgery, post op day 1 for anterior cervical procedure  Communication   Communication: No difficulties  Cognition Arousal/Alertness: Awake/alert Behavior During Therapy: WFL for tasks assessed/performed Overall Cognitive Status: Within Functional Limits for tasks assessed                                        General Comments General comments (skin integrity, edema, etc.): redness on bilat feet, patient states its been there forever    Exercises     Assessment/Plan    PT Assessment Patient needs continued PT services  PT Problem List Decreased strength;Decreased range of motion;Decreased activity tolerance;Decreased balance;Decreased mobility;Decreased coordination;Decreased knowledge of use of DME;Decreased safety awareness;Decreased knowledge of precautions;Impaired sensation       PT Treatment Interventions DME instruction;Gait training;Stair training;Therapeutic activities;Functional mobility training;Therapeutic exercise;Balance training;Neuromuscular re-education;Patient/family education    PT Goals (Current goals can  be found in the Care Plan section)  Acute Rehab PT Goals Patient Stated Goal: go home today PT Goal Formulation: With patient Time For Goal Achievement: 02/04/18 Potential to Achieve Goals: Good    Frequency Min 5X/week   Barriers to discharge        Co-evaluation               AM-PAC PT "6 Clicks" Daily Activity  Outcome Measure Difficulty turning over in bed (including adjusting bedclothes, sheets and blankets)?: A Little Difficulty moving from lying on back to sitting on the side of the bed? : Unable Difficulty sitting down on and standing up from a chair with arms (e.g., wheelchair, bedside commode, etc,.)?: A Little Help needed moving to and from a bed to chair (including a wheelchair)?: A Little Help needed walking in hospital room?: A Little Help needed climbing 3-5 steps with a railing? : A Lot 6 Click Score: 15    End of Session Equipment Utilized During Treatment: Gait belt Activity Tolerance: Patient tolerated treatment well Patient left: in chair;with call bell/phone within reach;with family/visitor present Nurse Communication: Mobility status(need for an incentive spirometer) PT Visit Diagnosis: Unsteadiness on feet (R26.81);Difficulty in walking, not elsewhere classified (R26.2)    Time: 2778-2423 PT Time Calculation (min) (ACUTE ONLY): 41 min   Charges:   PT Evaluation $PT Eval Moderate Complexity: 1 Mod PT Treatments $Gait Training: 8-22 mins $Therapeutic Activity: 8-22 mins        Kittie Plater, PT, DPT Acute Rehabilitation Services Pager #: [phone] Office #: [phone]  Cesar Rogerson M Vicke Plotner 01/28/2018, 8:26 AM

## 2018-01-29 LAB — GLUCOSE, CAPILLARY
Glucose-Capillary: 142 mg/dL — ABNORMAL HIGH (ref 70–99)
Glucose-Capillary: 216 mg/dL — ABNORMAL HIGH (ref 70–99)

## 2018-01-29 MED ORDER — DOCUSATE SODIUM 100 MG PO CAPS: 100.0000 mg | ORAL_CAPSULE | Freq: Two times a day (BID) | ORAL | 0 refills | Status: DC

## 2018-01-29 MED ORDER — HYDROCODONE-ACETAMINOPHEN 5-325 MG PO TABS: 1.0000 | ORAL_TABLET | ORAL | 0 refills | Status: DC | PRN

## 2018-01-29 NOTE — Discharge Summary (Signed)
Physician Discharge Summary  Patient ID: Tonya Hall MRN: [id number] DOB/AGE: 68-Nov-1952 68 y.o.  Admit date: 01/24/2018 Discharge date: 01/29/2018  Admission Diagnoses: Type II odontoid fracture, cervical spinal cord injury  Discharge Diagnoses: As above Active Problems:   Odontoid fracture ([place])   Closed nondisplaced odontoid fracture with type II morphology [facility])   Discharged Condition: good  Hospital Course: The patient was admitted on 01/24/2018 after she suffered a type II odontoid fracture and a spinal cord injury.  I discussed the various treatment options with her and recommended placement of an anterior odontoid screw.  The patient decided to proceed with surgery that surgery.  Placed the odontoid screw on 01/27/2018.  The surgery went well.  The patient's postoperative course was remarkable for a bit of an unsteady gait and hand numbness.  We had PT and OT see the patient.  She felt like she was stable for discharge on 01/29/2018.  I gave her written and oral discharge instructions.  I answered all the patient's, and her husband's, questions.  Consults: PT, OT Significant Diagnostic Studies: Cervical CT, cervical MRI Treatments: Anterior odontoid screw fixation of type II odontoid fracture Discharge Exam: Blood pressure (!) 156/87, pulse (!) 131, temperature 97.8 F (36.6 C), temperature source Oral, resp. rate 16, height 4\' 10"  (1.473 m), weight 62.4 kg, last menstrual period 02/01/1999, SpO2 96 %. The patient is alert and pleasant.  She is moving all 4 extremities well.  She has some numbness and weakness her hands.  Her dressing is clean and dry.  There is no hematoma or shift.  Disposition: Home  Discharge Instructions    Call MD for:  difficulty breathing, headache or visual disturbances   Complete by:  As directed    Call MD for:  extreme fatigue   Complete by:  As directed    Call MD for:  hives   Complete by:  As directed    Call MD for:  persistant  dizziness or light-headedness   Complete by:  As directed    Call MD for:  persistant nausea and vomiting   Complete by:  As directed    Call MD for:  redness, tenderness, or signs of infection (pain, swelling, redness, odor or green/yellow discharge around incision site)   Complete by:  As directed    Call MD for:  severe uncontrolled pain   Complete by:  As directed    Call MD for:  temperature >100.4   Complete by:  As directed    Diet - low sodium heart healthy   Complete by:  As directed    Discharge instructions   Complete by:  As directed    Call [phone] for a followup appointment. Take a stool softener while you are using pain medications.   Driving Restrictions   Complete by:  As directed    Do not drive for 2 weeks.   Increase activity slowly   Complete by:  As directed    Lifting restrictions   Complete by:  As directed    Do not lift more than 5 pounds. No excessive bending or twisting.   May shower / Bathe   Complete by:  As directed    Remove the dressing for 3 days after surgery.  You may shower, but leave the incision alone.   Remove dressing in 24 hours   Complete by:  As directed      Allergies as of 01/29/2018      Reactions   Adalimumab Rash,  Other (See Comments)   FEVER, Fast pulse   Imuran [azathioprine Sodium] Rash   Denies Airway involvement   Methotrexate Other (See Comments)   Liver Enzyme elevation - after 1 month of use   Plaquenil [hydroxychloroquine Sulfate] Rash   Denies airway involvement.    Sulfonamide Derivatives Rash   Occurred with Sulfasalazine. Rash only.    Ceftin [cefuroxime Axetil] Nausea Only   Upset stomach   Morphine Nausea And Vomiting   IV morphine caused N and V  After surgery.   Has tolerated Kadian in the past       Medication List    STOP taking these medications   tiZANidine 4 MG tablet Commonly known as:  ZANAFLEX     TAKE these medications   albuterol 108 (90 Base) MCG/ACT inhaler Commonly known as:   PROVENTIL HFA;VENTOLIN HFA Inhale 2 puffs into the lungs every 6 (six) hours as needed for wheezing or shortness of breath.   aspirin 81 MG tablet Take 81 mg by mouth daily.   CALCIUM 600 + D PO Take 1 tablet by mouth 2 (two) times daily.   cetirizine 10 MG tablet Commonly known as:  ZYRTEC Take 10 mg by mouth daily as needed for allergies.   COSENTYX SENSOREADY (300 MG) 150 MG/ML Soaj Generic drug:  Secukinumab Inject 2 pens into the skin every 30 (thirty) days. Reported on 08/12/2015 What changed:    how much to take  when to take this  additional instructions   cyclobenzaprine 10 MG tablet Commonly known as:  FLEXERIL Take 10 mg by mouth as needed for muscle spasms.   diclofenac sodium 1 % Gel Commonly known as:  VOLTAREN Apply 2 g topically 3 (three) times daily as needed (pain).   docusate sodium 100 MG capsule Commonly known as:  COLACE Take 1 capsule (100 mg total) by mouth 2 (two) times daily.   ezetimibe 10 MG tablet Commonly known as:  ZETIA Take 10 mg by mouth 3 (three) times a week.   fish oil-omega-3 fatty acids 1000 MG capsule Take 2 g by mouth daily.   Ginger Root 500 MG Caps Take 1 capsule by mouth daily.   Glucosamine Sulfate 1000 MG Caps Take 2 capsules (2,000 mg total) by mouth daily.   guaiFENesin-dextromethorphan 100-10 MG/5ML syrup Commonly known as:  ROBITUSSIN DM Take 10 mLs by mouth every 6 (six) hours as needed for cough.   HUMULIN N KWIKPEN 100 UNIT/ML Kiwkpen Generic drug:  Insulin NPH (Human) (Isophane) Inject 10 Units into the skin at bedtime.   HYDROcodone-acetaminophen 5-325 MG tablet Commonly known as:  NORCO/VICODIN Take 1-2 tablets by mouth every 4 (four) hours as needed for moderate pain. What changed:    how much to take  how to take this  when to take this  reasons to take this  additional instructions   leflunomide 20 MG tablet Commonly known as:  ARAVA TAKE 1 TABLET BY MOUTH ONCE DAILY. What changed:   when to take this   lidocaine 2 % solution Commonly known as:  XYLOCAINE 2 TSP  PO qac/hs PRN FOR upper ABDOMINAL OR CHEST PAIN. MAY REPEAT DOSE EVERY 4 HOURS. NO MORE THAN 8 DOSES A DAY. What changed:    how much to take  how to take this  when to take this   losartan 50 MG tablet Commonly known as:  COZAAR Take 100 mg by mouth daily.   metoprolol succinate 50 MG 24 hr tablet Commonly known as:  TOPROL-XL Take 1 tablet (50 mg total) by mouth daily. Take with or immediately following a meal.   multivitamin tablet Take 1 tablet by mouth daily.   nortriptyline 25 MG capsule Commonly known as:  PAMELOR TAKE (1) CAPSULE BY MOUTH AT BEDTIME. What changed:  See the new instructions.   omeprazole 20 MG capsule Commonly known as:  PRILOSEC TAKE ONE CAPSULE BY MOUTH TWICE DAILY.   potassium chloride SA 20 MEQ tablet Commonly known as:  K-DUR,KLOR-CON Take 20 mEq by mouth daily.   predniSONE 5 MG tablet Commonly known as:  DELTASONE Take 1 tablet (5 mg total) by mouth every morning.   Probiotic Caps Take 1 capsule by mouth daily.   RECLAST IV Inject into the vein.   RESTASIS MULTIDOSE 0.05 % ophthalmic emulsion Generic drug:  cycloSPORINE Place 1 drop into both eyes 2 (two) times daily.   SOLU-CORTEF 100 MG Solr injection Generic drug:  hydrocortisone sodium succinate Inject 100 mg into the muscle See admin instructions. Take as needed when unable to swallow prednisone tablets   spironolactone 25 MG tablet Commonly known as:  ALDACTONE Take 25 mg by mouth daily.   TART CHERRY ADVANCED PO Take 1 tablet by mouth daily. daily   Turmeric 500 MG Caps Take 1 capsule by mouth daily.        Signed: Ophelia Charter 01/29/2018, 10:32 AM

## 2018-01-29 NOTE — Care Management Note (Signed)
Case Management Note  Patient Details  Name: Tonya Hall MRN: [id number] Date of Birth: [birth year]  Subjective/Objective:  67 year old female whose had a fall landing on her face the in the prone position earlier today initially she couldn't move her arms or legs felt numb in her arms or legs. Slowly her LEs and UEs started coming back. Pt found to have a fx odontoid. Pt now s/p anterior fixation of type II odontoid fx. PTA, pt independent, lives at home with spouse.                  Action/Plan: PT/OT recommending HH follow up, and pt agreeable to home therapies.  Referral to [facility], per pt choice.  Start of care 24-48h post dc date.  No DME needs, per pt.  Start of care for [facility] 24-48h post dc date.  Expected Discharge Date:  01/29/18               Expected Discharge Plan:  [place]  In-House Referral:     Discharge planning Services  CM Consult  Post Acute Care Choice:  Home Health Choice offered to:  Patient  DME Arranged:    DME Agency:     HH Arranged:  PT, OT HH Agency:  [place]  Status of Service:  Completed, signed off  If discussed at [place] of Stay Meetings, dates discussed:    Additional Comments:  Reinaldo Raddle, RN, BSN  Trauma/Neuro ICU Case Manager [phone]

## 2018-01-29 NOTE — Progress Notes (Signed)
Physical Therapy Treatment Patient Details Name: Tonya Hall MRN: [id number] DOB: 1950-10-07 Today's Date: 01/29/2018    History of Present Illness 67 year old female whose had a fall landing on her face the in the prone position. Pt found to have a fx odontoid. Pt now s/p anterior fixation of type II odontoid fx.   PMH: DM, HTN, Degenreative dsk disease, and psoriatic arthritis. PSH: sever back and cervical surgeries.    PT Comments    Pt progressing towards goals. However pt fatigues quickly. Recommend use of RW for safe ambulation at this time and discussed energy conservation and planning out her day if she has appointments and/or bathing. Acute PT to cont to follow.    Follow Up Recommendations  Home health PT;Supervision/Assistance - 24 hour     Equipment Recommendations  None recommended by PT    Recommendations for Other Services       Precautions / Restrictions Precautions Precautions: Cervical Precaution Booklet Issued: Yes (comment) Precaution Comments: pt and spouse with good verbal understanding Required Braces or Orthoses: Cervical Brace Cervical Brace: Hard collar Restrictions Weight Bearing Restrictions: No Other Position/Activity Restrictions: cervical lifting precautions    Mobility  Bed Mobility Overal bed mobility: Needs Assistance Bed Mobility: Rolling;Sidelying to Sit Rolling: Supervision Sidelying to sit: Supervision       General bed mobility comments: pt with good technique, smoother sequence/transition, pt able to push up onto R elbow  Transfers Overall transfer level: Needs assistance Equipment used: None Transfers: Sit to/from Stand Sit to Stand: Min guard         General transfer comment: verbal cues to push up from bed, increased time  Ambulation/Gait Ambulation/Gait assistance: Min assist Gait Distance (Feet): 150 Feet Assistive device: 1 person hand held assist Gait Pattern/deviations: Step-through pattern;Decreased  stride length Gait velocity: slow Gait velocity interpretation: <1.31 ft/sec, indicative of household ambulator General Gait Details: s/p stair negotiation pt stopped and had ant/post sway reporting "i'm not dizzy but I feel like I cant stand anymore. " pt proceeded to amb back holding onto hallway rail and PT's hand. pt reports "i just feel weak"   Stairs Stairs: Yes Stairs assistance: Min guard Stair Management: One rail Right;Step to pattern;Forwards Number of Stairs: 4(to mimic home) General stair comments: pt with increased s/p stair negotiation   Wheelchair Mobility    Modified Rankin (Stroke Patients Only)       Balance Overall balance assessment: Needs assistance Sitting-balance support: Feet supported;No upper extremity supported Sitting balance-Leahy Scale: Good Sitting balance - Comments: pt able to don bilat socks in sitting without episode of LOB   Standing balance support: No upper extremity supported Standing balance-Leahy Scale: Fair Standing balance comment: pt stood at sink to wash hands, pt with 1 epsiode of LOB but able to regain on own                            Cognition Arousal/Alertness: Awake/alert Behavior During Therapy: WFL for tasks assessed/performed Overall Cognitive Status: Within Functional Limits for tasks assessed                                        Exercises      General Comments General comments (skin integrity, edema, etc.): pt assisted to the bathroom, pt supervision with tolieting/hygiene      Pertinent Vitals/Pain Pain Assessment: 0-10 Pain Score:  3  Pain Location: body in general, back and arthritic pain, denies pan in neck Pain Descriptors / Indicators: Sore Pain Intervention(s): Limited activity within patient's tolerance    Home Living                      Prior Function            PT Goals (current goals can now be found in the care plan section) Acute Rehab PT  Goals Patient Stated Goal: go home Progress towards PT goals: Progressing toward goals    Frequency    Min 5X/week      PT Plan Current plan remains appropriate    Co-evaluation              AM-PAC PT "6 Clicks" Daily Activity  Outcome Measure  Difficulty turning over in bed (including adjusting bedclothes, sheets and blankets)?: None Difficulty moving from lying on back to sitting on the side of the bed? : None Difficulty sitting down on and standing up from a chair with arms (e.g., wheelchair, bedside commode, etc,.)?: A Little Help needed moving to and from a bed to chair (including a wheelchair)?: A Little Help needed walking in hospital room?: A Little Help needed climbing 3-5 steps with a railing? : A Little 6 Click Score: 20    End of Session Equipment Utilized During Treatment: Gait belt Activity Tolerance: Patient tolerated treatment well Patient left: in chair;with call bell/phone within reach;with family/visitor present Nurse Communication: Mobility status PT Visit Diagnosis: Unsteadiness on feet (R26.81);Difficulty in walking, not elsewhere classified (R26.2)     Time: 8937-3428 PT Time Calculation (min) (ACUTE ONLY): 24 min  Charges:  $Gait Training: 8-22 mins $Therapeutic Activity: 8-22 mins                     Kittie Plater, PT, DPT Acute Rehabilitation Services Pager #: [phone] Office #: [phone]    Berline Lopes 01/29/2018, 8:41 AM

## 2018-01-29 NOTE — Progress Notes (Signed)
OT Treatment Note  Pt making good progress. Apparent neurapraxia LUE greater than RUE. Improved movement patterns with use of proximal stabilization techniques. Continue to recommend [place].    01/29/18 1600  OT Visit Information  Last OT Received On 01/29/18  Assistance Needed +1  History of Present Illness 67 year old female whose had a fall landing on her face the in the prone position. Pt found to have a fx odontoid. Pt now s/p anterior fixation of type II odontoid fx.   PMH: DM, HTN, Degenreative dsk disease, and psoriatic arthritis. PSH: sever back and cervical surgeries.  Precautions  Precautions Cervical  Precaution Booklet Issued Yes (comment)  Precaution Comments pt and spouse with good verbal understanding  Required Braces or Orthoses Cervical Brace  Cervical Brace Hard collar  Pain Assessment  Pain Assessment 0-10  Pain Score 3  Pain Location body in general, back and arthritic pain, denies pan in neck  Pain Descriptors / Indicators Sore  Pain Intervention(s) Limited activity within patient's tolerance  Cognition  Arousal/Alertness Awake/alert  Behavior During Therapy WFL for tasks assessed/performed  Overall Cognitive Status Within Functional Limits for tasks assessed  General Comments pt talking about being tired, "in a fog", difficulty reading  Upper Extremity Assessment  Upper Extremity Assessment RUE deficits/detail;LUE deficits/detail  RUE Deficits / Details grossly 3/5, noted impaired sensation in finger tips  LUE Deficits / Details grossly 3/5, noted impaired sensation in finger tips; neurapraxia - worse than RUE  ADL  General ADL Comments reviewed compensatory strateiges for ADL. Pt verbalized understanding.  Balance  Standing balance-Leahy Scale Fair  General Comments  General comments (skin integrity, edema, etc.) Educated on home safety and reducing risk of falls  Exercises  Exercises Other exercises  Other Exercises  Other Exercises level 1 theraputty  activities  Other Exercises fine motor/coordination  Other Exercises scapualr strengthening and stabilizing to increase quality of movement of LUE; movemetn significantly improves with proximal stabilization  OT - End of Session  Equipment Utilized During Treatment Gait belt;Rolling walker;Cervical collar  Patient left in chair;with call bell/phone within reach;with family/visitor present  Nurse Communication Mobility status;Other (comment)  OT Assessment/Plan  OT Plan Discharge plan remains appropriate  OT Visit Diagnosis Other abnormalities of gait and mobility (R26.89);Muscle weakness (generalized) (M62.81);Ataxia, unspecified (R27.0);Pain  Pain - part of body  (neck/arms)  OT Frequency (ACUTE ONLY) Min 3X/week  Follow Up Recommendations Home health OT;Supervision/Assistance - 24 hour  OT Equipment None recommended by OT  AM-PAC OT "6 Clicks" Daily Activity Outcome Measure  Help from another person eating meals? 3  Help from another person taking care of personal grooming? 3  Help from another person toileting, which includes using toliet, bedpan, or urinal? 3  Help from another person bathing (including washing, rinsing, drying)? 3  Help from another person to put on and taking off regular upper body clothing? 3  Help from another person to put on and taking off regular lower body clothing? 3  6 Click Score 18  ADL G Code Conversion CK  OT Goal Progression  Progress towards OT goals Progressing toward goals  Acute Rehab OT Goals  Patient Stated Goal go home  OT Goal Formulation With patient  Time For Goal Achievement 02/11/18  Potential to Achieve Goals Good  ADL Goals  Pt Will Perform Lower Body Bathing with modified independence;sit to/from stand  Pt Will Perform Upper Body Dressing with modified independence;sitting  Pt Will Perform Toileting - Clothing Manipulation and hygiene with modified independence;sit to/from stand  Pt/caregiver will Perform Home Exercise Program  Increased strength;With theraputty;Both right and left upper extremity;With written HEP provided  Additional ADL Goal #1 Pt/husband will independently verbalize 3 cervical precautions  OT Time Calculation  OT Start Time (ACUTE ONLY) 1040  OT Stop Time (ACUTE ONLY) 1110  OT Time Calculation (min) 30 min  OT General Charges  $OT Visit 1 Visit  OT Treatments  $Self Care/Home Management  8-22 mins  $Therapeutic Activity 8-22 mins  Maurie Boettcher, OT/L   Acute OT Clinical Specialist [place] Pager [phone] Office [phone]

## 2018-01-29 NOTE — Progress Notes (Signed)
Patient d/c instructions given. No printed prescriptions to give, per MD sent electronically.  No equipment to give. Patient taken to car via wheelchair.

## 2018-01-30 DIAGNOSIS — H269 Unspecified cataract: Secondary | ICD-10-CM | POA: Diagnosis not present

## 2018-01-30 DIAGNOSIS — I10 Essential (primary) hypertension: Secondary | ICD-10-CM | POA: Diagnosis not present

## 2018-01-30 DIAGNOSIS — Z79891 Long term (current) use of opiate analgesic: Secondary | ICD-10-CM | POA: Diagnosis not present

## 2018-01-30 DIAGNOSIS — Z7951 Long term (current) use of inhaled steroids: Secondary | ICD-10-CM | POA: Diagnosis not present

## 2018-01-30 DIAGNOSIS — K219 Gastro-esophageal reflux disease without esophagitis: Secondary | ICD-10-CM | POA: Diagnosis not present

## 2018-01-30 DIAGNOSIS — S12112D Nondisplaced Type II dens fracture, subsequent encounter for fracture with routine healing: Secondary | ICD-10-CM | POA: Diagnosis not present

## 2018-01-30 DIAGNOSIS — E785 Hyperlipidemia, unspecified: Secondary | ICD-10-CM | POA: Diagnosis not present

## 2018-01-30 DIAGNOSIS — L4050 Arthropathic psoriasis, unspecified: Secondary | ICD-10-CM | POA: Diagnosis not present

## 2018-01-30 DIAGNOSIS — M810 Age-related osteoporosis without current pathological fracture: Secondary | ICD-10-CM | POA: Diagnosis not present

## 2018-01-30 DIAGNOSIS — E1140 Type 2 diabetes mellitus with diabetic neuropathy, unspecified: Secondary | ICD-10-CM | POA: Diagnosis not present

## 2018-01-30 DIAGNOSIS — I7300 Raynaud's syndrome without gangrene: Secondary | ICD-10-CM | POA: Diagnosis not present

## 2018-01-30 DIAGNOSIS — Z794 Long term (current) use of insulin: Secondary | ICD-10-CM | POA: Diagnosis not present

## 2018-01-30 DIAGNOSIS — Z7982 Long term (current) use of aspirin: Secondary | ICD-10-CM | POA: Diagnosis not present

## 2018-01-30 DIAGNOSIS — J45909 Unspecified asthma, uncomplicated: Secondary | ICD-10-CM | POA: Diagnosis not present

## 2018-01-30 DIAGNOSIS — Z9181 History of falling: Secondary | ICD-10-CM | POA: Diagnosis not present

## 2018-02-03 ENCOUNTER — Telehealth: Payer: Self-pay | Admitting: Rheumatology

## 2018-02-03 NOTE — Telephone Encounter (Signed)
Patient called requesting prescription refill of Cosentyx through [employer] patient assistance program.

## 2018-02-04 MED ORDER — SECUKINUMAB 150 MG/ML ~~LOC~~ SOAJ: 150.0000 mg | SUBCUTANEOUS | 0 refills | Status: DC

## 2018-02-04 NOTE — Telephone Encounter (Signed)
Last Visit: 12/17/17 Next Visit: 05/20/18  Labs: 01/24/18 Elevated WBC 15.6, elevated glucose TB Gold: 07/09/17 Neg   Okay to refill per Dr. Estanislado Pandy  Form faxed to [employer]

## 2018-02-07 ENCOUNTER — Other Ambulatory Visit: Payer: Self-pay | Admitting: Rheumatology

## 2018-02-07 NOTE — Telephone Encounter (Signed)
Last Visit: 12/17/17 Next Visit: 05/20/18   Okay to refill per Dr. Estanislado Pandy

## 2018-02-14 DIAGNOSIS — M4312 Spondylolisthesis, cervical region: Secondary | ICD-10-CM | POA: Diagnosis not present

## 2018-02-15 ENCOUNTER — Other Ambulatory Visit: Payer: Self-pay | Admitting: Physician Assistant

## 2018-02-17 NOTE — Telephone Encounter (Signed)
Last Visit: 12/17/17 Next Visit: 05/20/18   Okay to refill per Dr. Estanislado Pandy

## 2018-02-18 ENCOUNTER — Other Ambulatory Visit: Payer: Self-pay

## 2018-02-18 DIAGNOSIS — Z79899 Other long term (current) drug therapy: Secondary | ICD-10-CM

## 2018-02-19 LAB — CBC WITH DIFFERENTIAL/PLATELET
Basophils Absolute: 69 cells/uL (ref 0–200)
Basophils Relative: 0.9 %
Eosinophils Absolute: 293 cells/uL (ref 15–500)
Eosinophils Relative: 3.8 %
HCT: 36.4 % (ref 35.0–45.0)
Hemoglobin: 11.8 g/dL (ref 11.7–15.5)
Lymphs Abs: 1055 cells/uL (ref 850–3900)
MCH: 31.2 pg (ref 27.0–33.0)
MCHC: 32.4 g/dL (ref 32.0–36.0)
MCV: 96.3 fL (ref 80.0–100.0)
MPV: 10.8 fL (ref 7.5–12.5)
Monocytes Relative: 4.9 %
Neutro Abs: 5906 cells/uL (ref 1500–7800)
Neutrophils Relative %: 76.7 %
Platelets: 260 10*3/uL (ref 140–400)
RBC: 3.78 10*6/uL — ABNORMAL LOW (ref 3.80–5.10)
RDW: 13.8 % (ref 11.0–15.0)
Total Lymphocyte: 13.7 %
WBC mixed population: 377 cells/uL (ref 200–950)
WBC: 7.7 10*3/uL (ref 3.8–10.8)

## 2018-02-19 LAB — COMPLETE METABOLIC PANEL WITH GFR
AG Ratio: 1.9 (calc) (ref 1.0–2.5)
ALT: 32 U/L — ABNORMAL HIGH (ref 6–29)
AST: 25 U/L (ref 10–35)
Albumin: 4 g/dL (ref 3.6–5.1)
Alkaline phosphatase (APISO): 57 U/L (ref 33–130)
BUN: 14 mg/dL (ref 7–25)
CO2: 21 mmol/L (ref 20–32)
Calcium: 9.2 mg/dL (ref 8.6–10.4)
Chloride: 101 mmol/L (ref 98–110)
Creat: 0.86 mg/dL (ref 0.50–0.99)
GFR, Est African American: 80 mL/min/{1.73_m2} (ref >=60)
GFR, Est Non African American: 69 mL/min/{1.73_m2} (ref >=60)
Globulin: 2.1 g/dL (calc) (ref 1.9–3.7)
Glucose, Bld: 233 mg/dL — ABNORMAL HIGH (ref 65–99)
Potassium: 4.2 mmol/L (ref 3.5–5.3)
Sodium: 132 mmol/L — ABNORMAL LOW (ref 135–146)
Total Bilirubin: 0.3 mg/dL (ref 0.2–1.2)
Total Protein: 6.1 g/dL (ref 6.1–8.1)

## 2018-02-19 NOTE — Progress Notes (Signed)
Glucose is elevated.  Rest the labs are stable.  Please notify patient and fax results to her PCP.

## 2018-02-20 ENCOUNTER — Encounter: Payer: Self-pay | Admitting: Gastroenterology

## 2018-02-20 ENCOUNTER — Ambulatory Visit: Payer: PPO | Admitting: Gastroenterology

## 2018-02-20 DIAGNOSIS — K224 Dyskinesia of esophagus: Secondary | ICD-10-CM | POA: Diagnosis not present

## 2018-02-20 DIAGNOSIS — K219 Gastro-esophageal reflux disease without esophagitis: Secondary | ICD-10-CM | POA: Diagnosis not present

## 2018-02-20 DIAGNOSIS — K52831 Collagenous colitis: Secondary | ICD-10-CM | POA: Diagnosis not present

## 2018-02-20 NOTE — Progress Notes (Signed)
Subjective:    Patient ID: Tonya Hall, female    DOB: 02/14/1951, 67 y.o.   MRN: [id number]  [name], MD  HPI WAS WALKING HER SON'S LAB AND HE TOOK OFF. SHE FELL AND FACE PLANTED AND BROKE HER NECK. HAD MOMENTARY PARALYSIS FROM THE NECK DOWN. GOING TO THERAPY. HAD NECK SURGERY AND GETTING BETTER FROM A NEUROLOGIC STANDPOINT. BOWELS WERE CONSTIPATED FROM PAIN MEDS. TOOK STOOL SOFTENERS AND THEN GOT DIARRHEA THEN TOOK IMODIUM AND AFTER 5 DAYS NOT FINE.  HAVING HEMORRHOIDS PRESSURE-NO PAIN/BLEEDING. SWALLOWING IS CHALLENGING WITH NECK BRACE. NEEDS TO WEAR NECK BRACE FOR AN ADDITIONAL 3 WEEKS.  PT DENIES FEVER, CHILLS, HEMATOCHEZIA, HEMATEMESIS, nausea, vomiting, melena, diarrhea, CHEST PAIN, SHORTNESS OF BREATH,  CHANGE IN BOWEL IN HABITS, abdominal pain, problems swallowing, problems with sedation, OR heartburn or indigestion.  Past Medical History:  Diagnosis Date  . Asthma    Albuterol in haler prn  . Cataract    immature unsure which eye  . Chronic back pain   . Degenerative disk disease    psoriatic  . Diabetes mellitus without complication (HCC)    diet controlled  . Esophageal motility disorder    Non-specific, see modified barium study/speech path, BP  . GERD (gastroesophageal reflux disease)    takes Protonix bid  . History of blood transfusion    post c-section  . History of bronchitis   . HTN (hypertension)   . Hyperlipidemia    takes Pravastatin daily  . Lymphocytic colitis 05/26/2010   Responded to Entocort x 3 MOS  . Neuropathy   . Osteoporosis    gets Boniva every 3 months  . Peripheral edema    takes HCTZ daily  . PONV (postoperative nausea and vomiting)   . Psoriatic arthritis ([place])    Dr. Katherina Right  . Raynaud's disease   . Seasonal allergies   . SUI (stress urinary incontinence, female)   . Urinary urgency     Past Surgical History:  Procedure Laterality Date  . BIOPSY  04/17/2016   Procedure: BIOPSY;  Surgeon: Danie Binder, MD;  [facility]  [facility];  Service: Endoscopy;;  random colon bx's  . CERVICAL SPINE SURGERY  09/09/2017  . CESAREAN SECTION  1974, 1978      . COLONOSCOPY  06/19/2008   [id number] internal hemorrhoids/mild sigmoin colon diverticulosis  . COLONOSCOPY  2012   Dr. Gala Romney: normal rectum, diverticula, lymphocytic colitis   . COLONOSCOPY WITH PROPOFOL N/A 04/17/2016   Procedure: COLONOSCOPY WITH PROPOFOL;  Surgeon: Danie Binder, MD;  Location: [facility];  Service: Endoscopy;  Laterality: N/A;  900  . DILATION AND CURETTAGE OF UTERUS  1977   spontaneous abortion  . ESOPHAGOGASTRODUODENOSCOPY  06/19/2008   [id number] gastritis  . LUMBAR DISC ARTHROPLASTY  7/98  . LUMBAR FUSION  6/03, 11/08, 11/14   L4-5 fusion, L2-3 fusion, L1-2  . ODONTOID SCREW INSERTION N/A 01/27/2018   Procedure: ODONTOID SCREW INSERTION;  Surgeon: Newman Pies, MD;  Location: [facility];  Service: Neurosurgery;  Laterality: N/A;  ODONTOID SCREW INSERTION  . TONSILLECTOMY AND ADENOIDECTOMY    . TUBAL LIGATION     Allergies  Allergen Reactions  . Adalimumab Rash and Other (See Comments)    FEVER, Fast pulse  . Imuran [Azathioprine Sodium] Rash    Denies Airway involvement  . Methotrexate Other (See Comments)    Liver Enzyme elevation - after 1 month of use  . Plaquenil [Hydroxychloroquine Sulfate] Rash    Denies airway involvement.   Marland Kitchen  Sulfonamide Derivatives Rash    Occurred with Sulfasalazine. Rash only.   . Ceftin [Cefuroxime Axetil] Nausea Only    Upset stomach  . Morphine Nausea And Vomiting    IV morphine caused N and V  After surgery.   Has tolerated [place] in the past    Current Outpatient Medications  Medication Sig    . albuterol (PROVENTIL HFA;VENTOLIN HFA) 108 (90 BASE) MCG/ACT inhaler Inhale 2 puffs into the lungs every 6 (six) hours as needed for wheezing or shortness of breath.     Marland Kitchen aspirin 81 MG tablet Take 81 mg by mouth daily.      . Calcium Carb-Cholecalciferol (CALCIUM 600 + D PO) Take 1 tablet by  mouth 2 (two) times daily.    . cetirizine (ZYRTEC) 10 MG tablet Take 10 mg by mouth daily as needed for allergies.    Marland Kitchen diclofenac sodium (VOLTAREN) 1 % GEL Apply 2 g topically 3 (three) times daily as needed (pain).     Marland Kitchen ezetimibe (ZETIA) 10 MG tablet Take 10 mg by mouth 3 (three) times a week.     . fish oil-omega-3 fatty acids 1000 MG capsule Take 2 g by mouth daily.     . Ginger, Zingiber officinalis, (GINGER ROOT) 500 MG CAPS Take 1 capsule by mouth daily.     . Glucosamine Sulfate 1000 MG CAPS Take 2 capsules (2,000 mg total) by mouth daily.    Marland Kitchen guaiFENesin-dextromethorphan (ROBITUSSIN DM) 100-10 MG/5ML syrup Take 10 mLs by mouth every 6 (six) hours as needed for cough.    Marland Kitchen HUMULIN N KWIKPEN 100 UNIT/ML Kiwkpen Inject 10 Units into the skin at bedtime.    Marland Kitchen HYDROcodone-acetaminophen (NORCO/VICODIN) 5-325 MG tablet Take 1-2 tablets by mouth every 4 (four) hours as needed for moderate pain.    . hydrocortisone sodium succinate (SOLU-CORTEF) 100 MG SOLR injection Inject 100 mg into the muscle See admin instructions. Take as needed when unable to swallow prednisone tablets     . leflunomide (ARAVA) 20 MG tablet TAKE 1 TABLET BY MOUTH ONCE DAILY. (Patient taking differently: Take 20 mg by mouth at bedtime. )    . lidocaine (XYLOCAINE) 2 % solution  2 TSP  PO qac/hs PRN FOR upper ABDOMINAL OR CHEST PAIN. MAY REPEAT DOSE EVERY 4 HOURS. NO MORE THAN 8 DOSES A DAY.) NOT USING   . losartan (COZAAR) 50 MG tablet Take 100 mg by mouth daily.     . metoprolol succinate (TOPROL-XL) 50 MG 24 hr tablet Take 1 tablet (50 mg total) by mouth daily. Take with or immediately following a meal.    . Misc Natural Products (TART CHERRY ADVANCED PO) Take 1 tablet by mouth daily. daily     . Multiple Vitamin (MULTIVITAMIN) tablet Take 1 tablet by mouth daily.      . nortriptyline (PAMELOR) 25 MG capsule TAKE (1) CAPSULE BY MOUTH AT BEDTIME. (Patient taking differently: Take 25 mg by mouth at bedtime. )    .  omeprazole (PRILOSEC) 20 MG capsule  Take 20 mg by mouth 2 (two) times daily. ) MOST DAYS   . potassium chloride SA (K-DUR,KLOR-CON) 20 MEQ tablet Take 20 mEq by mouth daily.  COULDN'T SWALLOW PILLS   . predniSONE (DELTASONE) 5 MG tablet Take 1 tablet (5 mg total) by mouth every morning.    . predniSONE (DELTASONE) 5 MG tablet TAKE 1 TABLET BY MOUTH ONCE A DAY EVERY MORNING    . PROBIOTIC CAPS Take 1 capsule by mouth daily.    Marland Kitchen  RESTASIS MULTIDOSE 0.05 % ophthalmic emulsion Place 1 drop into both eyes 2 (two) times daily.     . Secukinumab (COSENTYX SENSOREADY PEN) 150 MG/ML SOAJ Inject 150 mg into the skin every 14 (fourteen) days. Faxed prescription to [employer]    . spironolactone (ALDACTONE) 25 MG tablet Take 25 mg by mouth daily.     Marland Kitchen topiramate (TOPAMAX) 200 MG tablet TAKE (1) TABLET BY MOUTH AT BEDTIME.    . Turmeric 500 MG CAPS Take 1 capsule by mouth daily.     . Zoledronic Acid (RECLAST IV) Inject into the vein.     . cyclobenzaprine (FLEXERIL) 10 MG tablet Take 10 mg by mouth as needed for muscle spasms.    .       Review of Systems PER HPI OTHERWISE ALL SYSTEMS ARE NEGATIVE. CHOLESTEROL PILL MAKES HER LEGS ACHE.    Objective:   Physical Exam  Constitutional: She is oriented to person, place, and time. She appears well-developed and well-nourished. No distress.  HENT:  Head: Normocephalic.  Mouth/Throat: Oropharynx is clear and moist. No oropharyngeal exudate.  HARD COLLAR IN PLACE  Eyes: Pupils are equal, round, and reactive to light. No scleral icterus.  Neck: Normal range of motion. Neck supple.  Cardiovascular: Normal rate, regular rhythm and normal heart sounds.  Pulmonary/Chest: Effort normal and breath sounds normal. No respiratory distress.  Abdominal: Soft. Bowel sounds are normal. She exhibits no distension. There is no tenderness.  Musculoskeletal: She exhibits deformity (BILATERAL HANDS). She exhibits no edema.  Lymphadenopathy:    She has no cervical adenopathy.   Neurological: She is alert and oriented to person, place, and time.  NO  NEW FOCAL DEFICITS  Psychiatric: She has a normal mood and affect.  Vitals reviewed.     Assessment & Plan:

## 2018-02-20 NOTE — Patient Instructions (Signed)
DRINK WATER TO KEEP YOUR URINE LIGHT YELLOW.  CONTINUE OMEPRAZOLE.  TAKE 30 MINUTES PRIOR TO YOUR FIRST MEAL.  PLEASE CALL WITH QUESTIONS OR CONCERNS.  FOLLOW UP IN 6 MOS.

## 2018-02-20 NOTE — Assessment & Plan Note (Signed)
SYMPTOMS CONTROLLED/RESOLVED.  CONTINUE TO MONITOR SYMPTOMS. CONTINUE OMEPRAZOLE.  TAKE 30 MINUTES PRIOR TO YOUR FIRST MEAL. PLEASE CALL WITH QUESTIONS OR CONCERNS. FOLLOW UP IN 6 MOS.

## 2018-02-20 NOTE — Assessment & Plan Note (Signed)
SYMPTOMS CONTROLLED/RESOLVED.  CONTINUE TO MONITOR SYMPTOMS. FOLLOW UP IN 6 MOS.  

## 2018-02-20 NOTE — Assessment & Plan Note (Signed)
SYMPTOMS FAIRLY WELL CONTROLLED and problems exacerbated by HARD COLLAR.  CONTINUE TO MONITOR SYMPTOMS. FOLLOW UP IN 6 MOS.

## 2018-02-20 NOTE — Progress Notes (Signed)
ON RECALL  °

## 2018-02-21 DIAGNOSIS — Z23 Encounter for immunization: Secondary | ICD-10-CM | POA: Diagnosis not present

## 2018-02-21 DIAGNOSIS — I10 Essential (primary) hypertension: Secondary | ICD-10-CM | POA: Diagnosis not present

## 2018-02-21 DIAGNOSIS — E876 Hypokalemia: Secondary | ICD-10-CM | POA: Diagnosis not present

## 2018-02-21 DIAGNOSIS — S129XXA Fracture of neck, unspecified, initial encounter: Secondary | ICD-10-CM | POA: Diagnosis not present

## 2018-02-21 NOTE — Progress Notes (Signed)
cc'ed to pcp °

## 2018-02-24 ENCOUNTER — Telehealth: Payer: Self-pay | Admitting: Pharmacy Technician

## 2018-02-24 NOTE — Telephone Encounter (Signed)
Spoke to patient about renewing patient assistance for [employer] for 2020. She will come by office today to complete application.  9:45 AM Beatriz Chancellor, CPhT

## 2018-02-28 ENCOUNTER — Emergency Department (HOSPITAL_COMMUNITY): Payer: PPO

## 2018-02-28 ENCOUNTER — Emergency Department (HOSPITAL_COMMUNITY)
Admission: EM | Admit: 2018-02-28 | Discharge: 2018-02-28 | Disposition: A | Discharge status: Home / Self Care | Payer: PPO | Attending: Emergency Medicine | Admitting: Emergency Medicine

## 2018-02-28 ENCOUNTER — Encounter (HOSPITAL_COMMUNITY): Payer: Self-pay | Admitting: Emergency Medicine

## 2018-02-28 ENCOUNTER — Other Ambulatory Visit: Payer: Self-pay

## 2018-02-28 DIAGNOSIS — S199XXA Unspecified injury of neck, initial encounter: Secondary | ICD-10-CM | POA: Diagnosis not present

## 2018-02-28 DIAGNOSIS — E1142 Type 2 diabetes mellitus with diabetic polyneuropathy: Secondary | ICD-10-CM | POA: Diagnosis not present

## 2018-02-28 DIAGNOSIS — I10 Essential (primary) hypertension: Secondary | ICD-10-CM | POA: Diagnosis not present

## 2018-02-28 DIAGNOSIS — R197 Diarrhea, unspecified: Secondary | ICD-10-CM | POA: Diagnosis not present

## 2018-02-28 DIAGNOSIS — W19XXXA Unspecified fall, initial encounter: Secondary | ICD-10-CM | POA: Diagnosis not present

## 2018-02-28 DIAGNOSIS — I959 Hypotension, unspecified: Secondary | ICD-10-CM | POA: Diagnosis not present

## 2018-02-28 DIAGNOSIS — Z79899 Other long term (current) drug therapy: Secondary | ICD-10-CM | POA: Insufficient documentation

## 2018-02-28 DIAGNOSIS — R42 Dizziness and giddiness: Secondary | ICD-10-CM

## 2018-02-28 DIAGNOSIS — Z794 Long term (current) use of insulin: Secondary | ICD-10-CM | POA: Insufficient documentation

## 2018-02-28 LAB — CBC WITH DIFFERENTIAL/PLATELET
Abs Immature Granulocytes: 0.09 10*3/uL — ABNORMAL HIGH (ref 0.00–0.07)
Basophils Absolute: 0.1 10*3/uL (ref 0.0–0.1)
Basophils Relative: 1 %
Eosinophils Absolute: 0.4 10*3/uL (ref 0.0–0.5)
Eosinophils Relative: 3 %
HCT: 38.2 % (ref 36.0–46.0)
Hemoglobin: 12.3 g/dL (ref 12.0–15.0)
Immature Granulocytes: 1 %
Lymphocytes Relative: 13 %
Lymphs Abs: 1.6 10*3/uL (ref 0.7–4.0)
MCH: 31 pg (ref 26.0–34.0)
MCHC: 32.2 g/dL (ref 30.0–36.0)
MCV: 96.2 fL (ref 80.0–100.0)
Monocytes Absolute: 0.8 10*3/uL (ref 0.1–1.0)
Monocytes Relative: 6 %
Neutro Abs: 9.5 10*3/uL — ABNORMAL HIGH (ref 1.7–7.7)
Neutrophils Relative %: 76 %
Platelets: 259 10*3/uL (ref 150–400)
RBC: 3.97 MIL/uL (ref 3.87–5.11)
RDW: 14.3 % (ref 11.5–15.5)
WBC: 12.4 10*3/uL — ABNORMAL HIGH (ref 4.0–10.5)
nRBC: 0 % (ref 0.0–0.2)

## 2018-02-28 LAB — COMPREHENSIVE METABOLIC PANEL
ALT: 33 U/L (ref 0–44)
AST: 28 U/L (ref 15–41)
Albumin: 3.9 g/dL (ref 3.5–5.0)
Alkaline Phosphatase: 53 U/L (ref 38–126)
Anion gap: 9 (ref 5–15)
BUN: 14 mg/dL (ref 8–23)
CO2: 20 mmol/L — ABNORMAL LOW (ref 22–32)
Calcium: 9.4 mg/dL (ref 8.9–10.3)
Chloride: 110 mmol/L (ref 98–111)
Creatinine, Ser: 0.85 mg/dL (ref 0.44–1.00)
GFR calc Af Amer: >60 mL/min (ref >60)
GFR calc non Af Amer: >60 mL/min (ref >60)
Glucose, Bld: 159 mg/dL — ABNORMAL HIGH (ref 70–99)
Potassium: 3.4 mmol/L — ABNORMAL LOW (ref 3.5–5.1)
Sodium: 139 mmol/L (ref 135–145)
Total Bilirubin: 0.8 mg/dL (ref 0.3–1.2)
Total Protein: 7 g/dL (ref 6.5–8.1)

## 2018-02-28 MED ORDER — SODIUM CHLORIDE 0.9 % IV BOLUS
1000.0000 mL | Freq: Once | INTRAVENOUS | Status: AC
  Administered 2018-02-28: 1000 mL via INTRAVENOUS

## 2018-02-28 MED ORDER — ONDANSETRON HCL 4 MG/2ML IJ SOLN
4.0000 mg | Freq: Once | INTRAMUSCULAR | Status: DC
  Filled 2018-02-28: qty 2

## 2018-02-28 MED ORDER — METHYLPREDNISOLONE SODIUM SUCC 125 MG IJ SOLR
125.0000 mg | Freq: Once | INTRAMUSCULAR | Status: AC
  Administered 2018-02-28: 125 mg via INTRAVENOUS
  Filled 2018-02-28: qty 2

## 2018-02-28 NOTE — ED Notes (Signed)
EDP at bedside  

## 2018-02-28 NOTE — ED Notes (Signed)
Patient transported to CT 

## 2018-02-28 NOTE — ED Triage Notes (Addendum)
Pt from home c/o dizziness while having a bowel movement. Reports hitting her head on the wall when she stumbled back. Pt is in an aspen collar from a previous fall. Pt states this is a chronic problem. She is on chronic steroid use PO. Has a prescription for solu-medrol IM, but it is out of date. AO x4.

## 2018-02-28 NOTE — ED Notes (Signed)
Pt returned from CT °

## 2018-02-28 NOTE — Discharge Instructions (Addendum)
Tests were reassuring.  Can eat and drink.  Rest.  Follow-up with your primary care doctor.

## 2018-03-03 NOTE — ED Provider Notes (Signed)
[facility] EMERGENCY DEPARTMENT Provider Note   CSN: [id number] Arrival date & time: 02/28/18  4098     History   Chief Complaint Chief Complaint  Patient presents with  . Dizziness    HPI Tonya Hall is a 67 y.o. female.  Dizziness and diarrhea earlier today at home while having a bowel movement followed by a syncopal episode..  This has been an ongoing problem.  She is on prednisone 5 mg daily.  During this event she hit her head on the wall.  She normally requires a steroid boost after these episodes.  No gross neurological deficits, chest pain, dyspnea, fever, sweats, chills, dysuria.  Severity symptoms is moderate.  Nothing makes symptoms better or worse.     Past Medical History:  Diagnosis Date  . Asthma    Albuterol in haler prn  . Cataract    immature unsure which eye  . Chronic back pain   . Degenerative disk disease    psoriatic  . Diabetes mellitus without complication (HCC)    diet controlled  . Esophageal motility disorder    Non-specific, see modified barium study/speech path, BP  . GERD (gastroesophageal reflux disease)    takes Protonix bid  . History of blood transfusion    post c-section  . History of bronchitis   . HTN (hypertension)   . Hyperlipidemia    takes Pravastatin daily  . Lymphocytic colitis 05/26/2010   Responded to Entocort x 3 MOS  . Neuropathy   . Osteoporosis    gets Boniva every 3 months  . Peripheral edema    takes HCTZ daily  . PONV (postoperative nausea and vomiting)   . Psoriatic arthritis ([place])    Dr. Katherina Right  . Raynaud's disease   . Seasonal allergies   . SUI (stress urinary incontinence, female)   . Urinary urgency     Patient Active Problem List   Diagnosis Date Noted  . Closed nondisplaced odontoid fracture with type II morphology ([place]) 01/27/2018  . Odontoid fracture ([place]) 01/24/2018  . Chest pain 11/14/2017  . Transaminitis 06/22/2016  . Collagenous colitis 05/23/2016  . Psoriasis 05/21/2016  .  High risk medication use 02/15/2016  . Age-related osteoporosis without current pathological fracture 02/15/2016  . Trochanteric bursitis, left hip 02/15/2016  . Sacroiliitis, not elsewhere classified ([place]) 02/15/2016  . Chronic pain syndrome 02/15/2016  . Abnormal weight gain 06/25/2012  . Hypertension 07/03/2011  . Hypercholesteremia 07/03/2011  . Psoriatic arthritis ([place]) 07/03/2011  . Osteoarthritis of multiple joints 07/03/2011  . Esophageal motility disorder 02/14/2011  . Cough 02/14/2011  . GERD 05/05/2010    Past Surgical History:  Procedure Laterality Date  . BIOPSY  04/17/2016   Procedure: BIOPSY;  Surgeon: Danie Binder, MD;  [facility];  Service: Endoscopy;;  random colon bx's  . CERVICAL SPINE SURGERY  09/09/2017  . CESAREAN SECTION  1974, 1978      . COLONOSCOPY  06/19/2008   JXB:[id number] internal hemorrhoids/mild sigmoin colon diverticulosis  . COLONOSCOPY  2012   Dr. Gala Romney: normal rectum, diverticula, lymphocytic colitis   . COLONOSCOPY WITH PROPOFOL N/A 04/17/2016   Procedure: COLONOSCOPY WITH PROPOFOL;  Surgeon: Danie Binder, MD;  Location: [facility];  Service: Endoscopy;  Laterality: N/A;  900  . DILATION AND CURETTAGE OF UTERUS  1977   spontaneous abortion  . ESOPHAGOGASTRODUODENOSCOPY  06/19/2008   NFA:[id number] gastritis  . LUMBAR DISC ARTHROPLASTY  7/98  . LUMBAR FUSION  6/03, 11/08, 11/14  L4-5 fusion, L2-3 fusion, L1-2  . ODONTOID SCREW INSERTION N/A 01/27/2018   Procedure: ODONTOID SCREW INSERTION;  Surgeon: Newman Pies, MD;  Location: [place];  Service: Neurosurgery;  Laterality: N/A;  ODONTOID SCREW INSERTION  . TONSILLECTOMY AND ADENOIDECTOMY    . TUBAL LIGATION       OB History    Gravida  3   Para  2   Term  2   Preterm      AB  1   Living  2     SAB  0   TAB      Ectopic      Multiple      Live Births               Home Medications    Prior to Admission medications   Medication Sig Start Date  End Date Taking? Authorizing Provider  albuterol (PROVENTIL HFA;VENTOLIN HFA) 108 (90 BASE) MCG/ACT inhaler Inhale 2 puffs into the lungs every 6 (six) hours as needed for wheezing or shortness of breath.    Yes [provider]  aspirin 81 MG tablet Take 81 mg by mouth daily.     Yes [provider]  Calcium Carb-Cholecalciferol (CALCIUM 600 + D PO) Take 1 tablet by mouth 2 (two) times daily.   Yes [provider]  cetirizine (ZYRTEC) 10 MG tablet Take 10 mg by mouth daily as needed for allergies.   Yes [provider]  docusate sodium (COLACE) 100 MG capsule Take 1 capsule (100 mg total) by mouth 2 (two) times daily. 01/29/18  Yes Newman Pies, MD  fish oil-omega-3 fatty acids 1000 MG capsule Take 2 g by mouth daily.    Yes [provider]  Ginger, Zingiber officinalis, (GINGER ROOT) 500 MG CAPS Take 1 capsule by mouth daily.    Yes [provider]  Glucosamine Sulfate 1000 MG CAPS Take 2 capsules (2,000 mg total) by mouth daily. 07/03/11  Yes Hensel, Jamal Collin, MD  guaiFENesin-dextromethorphan (ROBITUSSIN DM) 100-10 MG/5ML syrup Take 10 mLs by mouth every 6 (six) hours as needed for cough.   Yes [provider]  HUMULIN N KWIKPEN 100 UNIT/ML Kiwkpen Inject 10 Units into the skin at bedtime. 05/23/16  Yes [provider]  HYDROcodone-acetaminophen (NORCO/VICODIN) 5-325 MG tablet Take 1-2 tablets by mouth every 4 (four) hours as needed for moderate pain. 01/29/18  Yes Newman Pies, MD  leflunomide (ARAVA) 20 MG tablet TAKE 1 TABLET BY MOUTH ONCE DAILY. Patient taking differently: Take 20 mg by mouth at bedtime.  12/05/17  Yes Ofilia Neas, PA-C  losartan (COZAAR) 50 MG tablet Take 100 mg by mouth daily.    Yes [provider]  metoprolol succinate (TOPROL-XL) 50 MG 24 hr tablet Take 1 tablet (50 mg total) by mouth daily. Take with or immediately following a meal. 07/03/11  Yes Hensel, Jamal Collin, MD  Misc Natural  Products (TART CHERRY ADVANCED PO) Take 1 tablet by mouth daily. daily    Yes [provider]  Multiple Vitamin (MULTIVITAMIN) tablet Take 1 tablet by mouth daily.     Yes [provider]  nortriptyline (PAMELOR) 25 MG capsule TAKE (1) CAPSULE BY MOUTH AT BEDTIME. Patient taking differently: Take 25 mg by mouth at bedtime.  12/05/17  Yes Ofilia Neas, PA-C  omeprazole (PRILOSEC) 20 MG capsule TAKE ONE CAPSULE BY MOUTH TWICE DAILY. Patient taking differently: Take 20 mg by mouth 2 (two) times daily.  10/14/17  Yes Neil Crouch  S, PA-C  potassium chloride 20 MEQ/15ML (10%) SOLN  02/10/18  Yes [provider]  predniSONE (DELTASONE) 5 MG tablet Take 1 tablet (5 mg total) by mouth every morning. 01/06/18  Yes Deveshwar, Abel Presto, MD  RESTASIS MULTIDOSE 0.05 % ophthalmic emulsion Place 1 drop into both eyes 2 (two) times daily.  12/27/15  Yes [provider]  Secukinumab (COSENTYX SENSOREADY PEN) 150 MG/ML SOAJ Inject 150 mg into the skin every 14 (fourteen) days. Faxed prescription to [facility] 02/04/18  Yes Deveshwar, Abel Presto, MD  spironolactone (ALDACTONE) 25 MG tablet Take 25 mg by mouth daily.    Yes [provider]  topiramate (TOPAMAX) 200 MG tablet TAKE (1) TABLET BY MOUTH AT BEDTIME. 02/17/18  Yes Deveshwar, Abel Presto, MD  Turmeric 500 MG CAPS Take 1 capsule by mouth daily.    Yes [provider]  Zoledronic Acid (RECLAST IV) Inject into the vein.    Yes [provider]  diclofenac sodium (VOLTAREN) 1 % GEL Apply 2 g topically 3 (three) times daily as needed (pain).  01/17/15   [provider]  hydrocortisone sodium succinate (SOLU-CORTEF) 100 MG SOLR injection Inject 100 mg into the muscle See admin instructions. Take as needed when unable to swallow prednisone tablets     [provider]  lidocaine (XYLOCAINE) 2 % solution 2 TSP  PO qac/hs PRN FOR upper ABDOMINAL OR CHEST PAIN. MAY REPEAT DOSE EVERY 4 HOURS. NO MORE THAN 8  DOSES A DAY. Patient taking differently: Use as directed 20 mLs in the mouth or throat See admin instructions. 2 TSP  PO qac/hs PRN FOR upper ABDOMINAL OR CHEST PAIN. MAY REPEAT DOSE EVERY 4 HOURS. NO MORE THAN 8 DOSES A DAY. 05/15/17   Fields, Sandi L, MD  potassium chloride SA (K-DUR,KLOR-CON) 20 MEQ tablet Take 20 mEq by mouth daily.     [provider]  Glucosamine 500 MG TABS Take 4 tablets by mouth daily.    07/03/11  [provider]  simvastatin (ZOCOR) 40 MG tablet Take 20 mg by mouth at bedtime.   07/03/11  [provider]    Family History Family History  Problem Relation Age of Onset  . Diabetes Mother   . Pancreatitis Mother   . Arthritis Mother        psoriatic   . Fibromyalgia Mother   . Rheumatic fever Father   . Diabetes Unknown        grandparents  . Skin cancer Unknown        grandfather  . Hypertension Unknown        grandparent  . Congestive Heart Failure Unknown        grandfather  . Parkinson's disease Unknown        grandmother  . Colon cancer Maternal Uncle   . Fibromyalgia Sister   . Rheum arthritis Sister   . Diabetes Sister   . Fibromyalgia Sister   . Diabetes Sister   . Hypertension Son   . Colon polyps Neg Hx     Social History Social History   Tobacco Use  . Smoking status: Never Smoker  . Smokeless tobacco: Never Used  Substance Use Topics  . Alcohol use: No  . Drug use: No     Allergies   Adalimumab; Imuran [azathioprine sodium]; Methotrexate; Plaquenil [hydroxychloroquine sulfate]; Sulfonamide derivatives; Ceftin [cefuroxime axetil]; and Morphine   Review of Systems Review of Systems  All other systems reviewed and are negative.    Physical Exam Updated Vital Signs  BP (!) 144/67   Pulse (!) 105   Temp 97.8 F (36.6 C) (Oral)   Resp 18   Ht 4\' 11"  (1.499 m)   Wt 61.2 kg   LMP 02/01/1999   SpO2 96%   BMI 27.27 kg/m   Physical Exam  Constitutional: She is oriented to person, place, and time.  She appears well-developed and well-nourished.  nad  HENT:  Head: Normocephalic and atraumatic.  Eyes: Conjunctivae are normal.  Neck: Neck supple.  Cardiovascular: Normal rate and regular rhythm.  Pulmonary/Chest: Effort normal and breath sounds normal.  Abdominal: Soft. Bowel sounds are normal.  Musculoskeletal: Normal range of motion.  Neurological: She is alert and oriented to person, place, and time.  Skin: Skin is warm and dry.  Psychiatric: She has a normal mood and affect. Her behavior is normal.  Nursing note and vitals reviewed.    ED Treatments / Results  Labs (all labs ordered are listed, but only abnormal results are displayed) Labs Reviewed  CBC WITH DIFFERENTIAL/PLATELET - Abnormal; Notable for the following components:      Result Value   WBC 12.4 (*)    Neutro Abs 9.5 (*)    Abs Immature Granulocytes 0.09 (*)    All other components within normal limits  COMPREHENSIVE METABOLIC PANEL - Abnormal; Notable for the following components:   Potassium 3.4 (*)    CO2 20 (*)    Glucose, Bld 159 (*)    All other components within normal limits    EKG EKG Interpretation  Date/Time:  Friday February 28 2018 08:04:47 EST Ventricular Rate:  96 PR Interval:    QRS Duration: 98 QT Interval:  358 QTC Calculation: 453 R Axis:   50 Text Interpretation:  Sinus rhythm Borderline repolarization abnormality Confirmed by Nat Christen [id number]) on 02/28/2018 8:29:01 AM   Radiology No results found.  Procedures Procedures (including critical care time)  Medications Ordered in ED Medications  sodium chloride 0.9 % bolus 1,000 mL ( Intravenous Stopped 02/28/18 0935)  methylPREDNISolone sodium succinate (SOLU-MEDROL) 125 mg/2 mL injection 125 mg (125 mg Intravenous Given 02/28/18 0831)     Initial Impression / Assessment and Plan / ED Course  I have reviewed the triage vital signs and the nursing notes.  Pertinent labs & imaging results that were available during my  care of the patient were reviewed by me and considered in my medical decision making (see chart for details).     Patient presents with a dizzy and syncopal spell earlier today.  She is hemodynamically stable in the emergency department.  EKG and hemoglobin stable.  She feels better after IV fluids and IV steroids.  Final Clinical Impressions(s) / ED Diagnoses   Final diagnoses:  Dizziness  Diarrhea, unspecified type    ED Discharge Orders    None       Nat Christen, MD 03/03/18 1625

## 2018-03-06 ENCOUNTER — Encounter (HOSPITAL_COMMUNITY)
Admission: RE | Admit: 2018-03-06 | Discharge: 2018-03-06 | Disposition: A | Discharge status: Home / Self Care | Payer: PPO | Source: Ambulatory Visit | Attending: Rheumatology | Admitting: Rheumatology

## 2018-03-06 ENCOUNTER — Encounter (HOSPITAL_COMMUNITY): Payer: Self-pay

## 2018-03-06 DIAGNOSIS — M810 Age-related osteoporosis without current pathological fracture: Secondary | ICD-10-CM | POA: Insufficient documentation

## 2018-03-06 MED ORDER — ZOLEDRONIC ACID 5 MG/100ML IV SOLN
5.0000 mg | Freq: Once | INTRAVENOUS | Status: AC
  Administered 2018-03-06: 5 mg via INTRAVENOUS

## 2018-03-06 MED ORDER — SODIUM CHLORIDE 0.9 % IV SOLN
Freq: Once | INTRAVENOUS | Status: AC
  Administered 2018-03-06: 10:00:00 via INTRAVENOUS

## 2018-03-06 MED ORDER — ZOLEDRONIC ACID 5 MG/100ML IV SOLN
INTRAVENOUS | Status: AC
  Filled 2018-03-06: qty 100

## 2018-03-11 ENCOUNTER — Telehealth: Payer: Self-pay | Admitting: Rheumatology

## 2018-03-11 DIAGNOSIS — Z5181 Encounter for therapeutic drug level monitoring: Secondary | ICD-10-CM

## 2018-03-11 NOTE — Telephone Encounter (Signed)
Patient left a voicemail requesting prescription refill of Hydrocodone to be sent to [facility] in [place].  Patient states she fell and broke her neck in late October and was admitted to the hospital for 5 days.  Patient states she saw Dr. Newman Pies who gave her "a small bottle" of Hydrocodone and wanted to make sure she notified Dr. Estanislado Pandy.

## 2018-03-11 NOTE — Telephone Encounter (Addendum)
Last Visit: 12/17/17 Next Visit: 05/20/18  UDS: 09/03/17 Narc Argeement: 09/03/17  Patient will update UDS and narc agreement today

## 2018-03-13 ENCOUNTER — Telehealth: Payer: Self-pay | Admitting: Rheumatology

## 2018-03-13 ENCOUNTER — Other Ambulatory Visit: Payer: Self-pay

## 2018-03-13 ENCOUNTER — Other Ambulatory Visit: Payer: Self-pay | Admitting: Rheumatology

## 2018-03-13 DIAGNOSIS — Z5181 Encounter for therapeutic drug level monitoring: Secondary | ICD-10-CM

## 2018-03-13 MED ORDER — HYDROCODONE-ACETAMINOPHEN 5-325 MG PO TABS: ORAL_TABLET | ORAL | 0 refills | Status: DC

## 2018-03-13 NOTE — Telephone Encounter (Signed)
Patient updated narcotic agreement in office today and will be going to [facility] lab to update UDS.   Okay to refill hydrocodone?

## 2018-03-13 NOTE — Telephone Encounter (Signed)
Patient left a voicemail stating she left a message requesting prescription refill of Hydrocodone to be sent to [facility] and is now out of medication.  Patient requested a return call.

## 2018-03-14 NOTE — Progress Notes (Signed)
Office Visit Note  Patient: Tonya Hall             Date of Birth: Nov 03, 1950           MRN: [id number]             PCP: [name], MD Referring: [name], MD Visit Date: 03/28/2018 Occupation: @GUAROCC @  Subjective:  Pain in both hands  History of Present Illness: Tonya Hall is a 67 y.o. female with history of psoriatic arthritis, osteoarthritis, and DDD.  She is on Cosentyx 150 mg sq every 14 days, Arava 20 mg po daily, and prednisone 5 mg po daily.  Patient reports that in October she fell and fractured her vertebrae in her neck and had surgery.  She states that she had to wear a brace up until 2 weeks ago.  She states that she has very limited range of motion with discomfort.  She states that she has numbness in bilateral hands following the surgery.  She reports that her mother passed away on 07-09-2022 but she has been under tremendous amount of stress.  She states that she is having pain and swelling in bilateral hands.  She has intermittent SI joint pain.  She denies any Achilles tenderness or plantar fasciitis.  She continues to have bilateral trochanteric bursitis.  Overall she feels as though Cosentyx has been very effective especially after splitting the dose to 150 mg every 14 days.    Activities of Daily Living:  Patient reports morning stiffness for 1  hour.   Patient Reports nocturnal pain.  Difficulty dressing/grooming: Denies Difficulty climbing stairs: Reports Difficulty getting out of chair: Reports Difficulty using hands for taps, buttons, cutlery, and/or writing: Reports  Review of Systems  Constitutional: Positive for fatigue.  HENT: Negative for mouth sores, mouth dryness and nose dryness.   Eyes: Negative for pain, visual disturbance and dryness.  Respiratory: Negative for cough, hemoptysis, shortness of breath and difficulty breathing.   Cardiovascular: Negative for chest pain, palpitations, hypertension and swelling in legs/feet.  Gastrointestinal:  Negative for blood in stool, constipation and diarrhea.  Endocrine: Negative for increased urination.  Genitourinary: Negative for painful urination.  Musculoskeletal: Positive for arthralgias, joint pain, joint swelling and morning stiffness. Negative for myalgias, muscle weakness, muscle tenderness and myalgias.  Skin: Negative for color change, pallor, rash, hair loss, nodules/bumps, skin tightness, ulcers and sensitivity to sunlight.  Allergic/Immunologic: Negative for susceptible to infections.  Neurological: Negative for dizziness, numbness, headaches and weakness.  Hematological: Negative for swollen glands.  Psychiatric/Behavioral: Negative for depressed mood and sleep disturbance. The patient is not nervous/anxious.     PMFS History:  Patient Active Problem List   Diagnosis Date Noted  . Closed nondisplaced odontoid fracture with type II morphology ([place]) 01/27/2018  . Odontoid fracture ([place]) 01/24/2018  . Chest pain 11/14/2017  . Transaminitis 06/22/2016  . Collagenous colitis 05/23/2016  . Psoriasis 05/21/2016  . High risk medication use 02/15/2016  . Age-related osteoporosis without current pathological fracture 02/15/2016  . Trochanteric bursitis, left hip 02/15/2016  . Sacroiliitis, not elsewhere classified ([place]) 02/15/2016  . Chronic pain syndrome 02/15/2016  . Abnormal weight gain 06/25/2012  . Hypertension 07/03/2011  . Hypercholesteremia 07/03/2011  . Psoriatic arthritis ([place]) 07/03/2011  . Osteoarthritis of multiple joints 07/03/2011  . Esophageal motility disorder 02/14/2011  . Cough 02/14/2011  . GERD 05/05/2010    Past Medical History:  Diagnosis Date  . Asthma    Albuterol in haler prn  . Cataract  immature unsure which eye  . Chronic back pain   . Degenerative disk disease    psoriatic  . Diabetes mellitus without complication (HCC)    diet controlled  . Esophageal motility disorder    Non-specific, see modified barium study/speech path, BP  .  GERD (gastroesophageal reflux disease)    takes Protonix bid  . History of blood transfusion    post c-section  . History of bronchitis   . HTN (hypertension)   . Hyperlipidemia    takes Pravastatin daily  . Lymphocytic colitis 05/26/2010   Responded to Entocort x 3 MOS  . Neuropathy   . Osteoporosis    gets Boniva every 3 months  . Peripheral edema    takes HCTZ daily  . PONV (postoperative nausea and vomiting)   . Psoriatic arthritis ([place])    Dr. Katherina Right  . Raynaud's disease   . Seasonal allergies   . SUI (stress urinary incontinence, female)   . Urinary urgency     Family History  Problem Relation Age of Onset  . Diabetes Mother   . Pancreatitis Mother   . Arthritis Mother        psoriatic   . Fibromyalgia Mother   . Rheumatic fever Father   . Diabetes Other        grandparents  . Skin cancer Other        grandfather  . Hypertension Other        grandparent  . Congestive Heart Failure Other        grandfather  . Parkinson's disease Other        grandmother  . Colon cancer Maternal Uncle   . Fibromyalgia Sister   . Rheum arthritis Sister   . Diabetes Sister   . Fibromyalgia Sister   . Diabetes Sister   . Hypertension Son   . Colon polyps Neg Hx    Past Surgical History:  Procedure Laterality Date  . BIOPSY  04/17/2016   Procedure: BIOPSY;  Surgeon: Danie Binder, MD;  [facility];  Service: Endoscopy;;  random colon bx's  . CERVICAL SPINE SURGERY  09/09/2017  . CESAREAN SECTION  1974, 1978      . COLONOSCOPY  06/19/2008   [id number] internal hemorrhoids/mild sigmoin colon diverticulosis  . COLONOSCOPY  2012   Dr. Gala Romney: normal rectum, diverticula, lymphocytic colitis   . COLONOSCOPY WITH PROPOFOL N/A 04/17/2016   Procedure: COLONOSCOPY WITH PROPOFOL;  Surgeon: Danie Binder, MD;  [facility]: [facility];  Service: Endoscopy;  Laterality: N/A;  900  . DILATION AND CURETTAGE OF UTERUS  1977   spontaneous abortion  .  ESOPHAGOGASTRODUODENOSCOPY  06/19/2008   [id number]:[id number] gastritis  . LUMBAR DISC ARTHROPLASTY  7/98  . LUMBAR FUSION  6/03, 11/08, 11/14   L4-5 fusion, L2-3 fusion, L1-2  . ODONTOID SCREW INSERTION N/A 01/27/2018   Procedure: ODONTOID SCREW INSERTION;  Surgeon: Newman Pies, MD;  Location: [facility];  Service: Neurosurgery;  Laterality: N/A;  ODONTOID SCREW INSERTION  . TONSILLECTOMY AND ADENOIDECTOMY    . TUBAL LIGATION     Social History   Social History Narrative   ATTENDS [employer]. 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    Objective: Vital Signs: BP (!) 152/76 (BP Location: Left Arm, Patient Position: Sitting, Cuff Size: Normal)   Pulse 94   Resp 13   Ht 4\' 11"  (1.499 m)   Wt 136 lb (61.7 kg)   LMP 02/01/1999   BMI 27.47 kg/m    Physical Exam  Vitals signs and nursing note reviewed.  Constitutional:      Appearance: She is well-developed.  HENT:     Head: Normocephalic and atraumatic.  Eyes:     Conjunctiva/sclera: Conjunctivae normal.  Neck:     Musculoskeletal: Normal range of motion.  Cardiovascular:     Rate and Rhythm: Normal rate and regular rhythm.     Heart sounds: Normal heart sounds.  Pulmonary:     Effort: Pulmonary effort is normal.     Breath sounds: Normal breath sounds.  Abdominal:     General: Bowel sounds are normal.     Palpations: Abdomen is soft.  Lymphadenopathy:     Cervical: No cervical adenopathy.  Skin:    General: Skin is warm and dry.     Capillary Refill: Capillary refill takes less than 2 seconds.  Neurological:     Mental Status: She is alert and oriented to person, place, and time.  Psychiatric:        Behavior: Behavior normal.      Musculoskeletal Exam: C-spine very limited ROM with discomfort.  Shoulder joints, elbow joints, and wrist joints good ROM with no synovitis.  Mild SI joint tenderness bilaterally.  Incomplete fist formation bilaterally.  She has dactylitis and synovitis as described below.  Hip joints, knee joints  and ankle joints MTPs and PIPs and DIPs good range of motion with no synovitis.  No warmth or effusion of bilateral knee joints.  No tenderness or swelling of ankle joints.  She has bilateral tenderness over trochanter bursa.  CDAI Exam: CDAI Score: Not documented Patient Global Assessment: Not documented; Provider Global Assessment: Not documented Swollen: 8 ; Tender: 8  Joint Exam      Right  Left  PIP 2  Swollen Tender  Swollen Tender  PIP 3  Swollen Tender  Swollen Tender  PIP 4  Swollen Tender  Swollen Tender  PIP 5  Swollen Tender  Swollen Tender     Investigation: No additional findings.  Imaging: Ct Cervical Spine Wo Contrast  Result Date: 02/28/2018 CLINICAL DATA:  Fall. EXAM: CT CERVICAL SPINE WITHOUT CONTRAST TECHNIQUE: Multidetector CT imaging of the cervical spine was performed without intravenous contrast. Multiplanar CT image reconstructions were also generated. COMPARISON:  CT of 01/24/2018 FINDINGS: Alignment: Slight anterolisthesis of C5 on C6 and C7 on T1 likely related to facet disease. Skull base and vertebrae: Screw fixation across the previously seen displaced odontoid fracture. No acute fracture. Soft tissues and spinal canal: No prevertebral fluid or swelling. No visible canal hematoma. Disc levels: Prior anterior fusion from C3-C5. No hardware complicating feature. Upper chest: No acute findings Other: None IMPRESSION: Screw fixation across the previously seen displaced would on toy fracture. No acute fracture. Prior anterior fusion C3-C5. Cervical spondylosis. Electronically Signed   By: Rolm Baptise M.D.   On: 02/28/2018 09:56    Recent Labs: Lab Results  Component Value Date   WBC 12.4 (H) 02/28/2018   HGB 12.3 02/28/2018   PLT 259 02/28/2018   NA 139 02/28/2018   K 3.4 (L) 02/28/2018   CL 110 02/28/2018   CO2 20 (L) 02/28/2018   GLUCOSE 159 (H) 02/28/2018   BUN 14 02/28/2018   CREATININE 0.85 02/28/2018   BILITOT 0.8 02/28/2018   ALKPHOS 53  02/28/2018   AST 28 02/28/2018   ALT 33 02/28/2018   PROT 7.0 02/28/2018   ALBUMIN 3.9 02/28/2018   CALCIUM 9.4 02/28/2018   GFRAA >60 02/28/2018   QFTBGOLD Negative 05/05/2015  QFTBGOLDPLUS NEGATIVE 07/09/2017    Speciality Comments: No specialty comments available.  Procedures:  Trigger Point Inj Date/Time: 03/28/2018 10:32 AM Performed by: Ofilia Neas, PA-C Authorized by: Ofilia Neas, PA-C   Consent Given by:  Patient Site marked: the procedure site was marked   Timeout: prior to procedure the correct patient, procedure, and site was verified   Indications:  Pain Total # of Trigger Points:  1 Location: neck   Needle Size:  27 G Approach:  Dorsal Medications #1:  0.5 mL lidocaine 1 %; 10 mg triamcinolone acetonide 40 MG/ML Patient tolerance:  Patient tolerated the procedure well with no immediate complications   Allergies: Adalimumab; Imuran [azathioprine sodium]; Methotrexate; Plaquenil [hydroxychloroquine sulfate]; Sulfonamide derivatives; Ceftin [cefuroxime axetil]; and Morphine   Assessment / Plan:     Visit Diagnoses: Psoriatic arthritis ([place]): She has synovitis and dactylitis in both hands.  She has been having a flare for 1-2 weeks.  She has incomplete fist formation.  She has mild SI joint tenderness on exam.  She has no achilles tendonitis or plantar fasciitis.  She has been under a tremendous amount of stress recently, which is contributing to the flare.  Overall she feels Cosentyx has been more effective since splitting the dose to 150 mg sq injections every 14 days.  She continues to take Arava 20 mg po daily and prednisone 5 mg po daily.  She will be started on a prednisone taper starting at 20 mg and tapering by 5 mg every 4 days then she will continue on 5 mg daily.  She does not need any other refills at this time.  She will continue on this current treatment regimen.  She has a follow-up visit in February 2020.  She was advised to notify us if she develops  increased joint pain or joint swelling.  Psoriasis: She has no active psoriasis at this time.  High risk medication use - Cosentyx 150 mg subcutaneous inj every 14 days, Arava 20 mg, Prednisone 5 mg TB gold: 07/09/17.  CBC and CMP were drawn on 02/28/2018.  She will return in February and every 3 months for lab work.  Chronic left SI joint pain: She has mild tenderness of bilateral SI joints.  She has intermittent SI joint discomfort.   Primary osteoarthritis involving multiple joints: She has chronic pain in multiple joints.  She is currently having a psoriatic arthritis flare in both hands.   DDD (degenerative disc disease), cervical - status post fusion x2: She had a closed odontoid fracture following a fall in October.  She had surgery on 01/27/2018.  She has very limited range of motion with discomfort on exam.  She continues to follow-up with Dr. Arnoldo Morale.  DDD (degenerative disc disease), thoracic: She has no midline spinal tenderness at this time.  Age-related osteoporosis without current pathological fracture: She takes a calcium and vitamin D supplement daily.  She receives Reclast infusions on yearly basis.  Trochanteric bursitis, left hip: She has tenderness on exam.  She declined a prednisone taper at this time.   Raynaud's syndrome without gangrene: She continues to have intermittent symptoms of Raynaud's.  She has no digital ulcerations or signs of gangrene.   Trapezius muscle spasm: She has been having increased muscle tension and muscle tenderness of the left trapezius muscle.  She requested trigger point injection today.  She tolerated the procedure well.  The procedure note is completed above.  Other medical conditions are listed as follows:   Essential hypertension  Esophageal motility disorder  History of gastroesophageal reflux (GERD)  Collagenous colitis  History of type 2 diabetes mellitus  Chronic pain syndrome   Orders: Orders Placed This Encounter    Procedures  . Trigger Point Inj   Meds ordered this encounter  Medications  . predniSONE (DELTASONE) 5 MG tablet    Sig: Take 4 tablets by mouth daily for 4 days, 3 tablets by mouth for 4 days, 2 tablets by mouth for 4 days, 1 tablet by mouth for 4 days    Dispense:  40 tablet    Refill:  0      Follow-Up Instructions: Return for Psoriatic arthritis.   Ofilia Neas, PA-C   I examined and evaluated the patient with Hazel Sams PA.  Patient was having a severe flare today with dactylitis in her bilateral hands on my exam.  She has been under a lot of stress that could have triggered her arthritis.  We have given her a prednisone taper.  Prior to this a stressful situation she was doing quite well on Cosentyx.  The plan of care was discussed as noted above.  Bo Merino, MD Note - This record has been created using Editor, commissioning.  Chart creation errors have been sought, but may not always  have been located. Such creation errors do not reflect on  the standard of medical care.

## 2018-03-14 NOTE — Telephone Encounter (Signed)
Patient received prescription in office on 03/13/18. While update UDS and Narcotic agreement.

## 2018-03-16 LAB — PAIN MGMT, PROFILE 5 W/CONF, U
Amphetamines: NEGATIVE ng/mL (ref <500)
Barbiturates: NEGATIVE ng/mL (ref <300)
Benzodiazepines: NEGATIVE ng/mL (ref <100)
Cocaine Metabolite: NEGATIVE ng/mL (ref <150)
Codeine: NEGATIVE ng/mL (ref <50)
Creatinine: 68.4 mg/dL
Hydrocodone: NEGATIVE ng/mL (ref <50)
Hydromorphone: NEGATIVE ng/mL (ref <50)
Marijuana Metabolite: NEGATIVE ng/mL (ref <20)
Methadone Metabolite: NEGATIVE ng/mL (ref <100)
Morphine: NEGATIVE ng/mL (ref <50)
Norhydrocodone: 101 ng/mL — ABNORMAL HIGH (ref <50)
Opiates: POSITIVE ng/mL — AB (ref <100)
Oxidant: NEGATIVE ug/mL (ref <200)
Oxycodone: NEGATIVE ng/mL (ref <100)
pH: 5.46 (ref 4.5–9.0)

## 2018-03-25 ENCOUNTER — Other Ambulatory Visit: Payer: Self-pay | Admitting: Rheumatology

## 2018-03-27 NOTE — Telephone Encounter (Signed)
Last Visit: 12/17/17 Next Visit: 05/20/18   Okay to refill per Dr. Estanislado Pandy

## 2018-03-28 ENCOUNTER — Encounter: Payer: Self-pay | Admitting: Rheumatology

## 2018-03-28 ENCOUNTER — Ambulatory Visit (INDEPENDENT_AMBULATORY_CARE_PROVIDER_SITE_OTHER): Payer: PPO | Admitting: Rheumatology

## 2018-03-28 VITALS — BP 152/76 | HR 94 | Resp 13 | Ht 59.0 in | Wt 136.0 lb

## 2018-03-28 DIAGNOSIS — G894 Chronic pain syndrome: Secondary | ICD-10-CM

## 2018-03-28 DIAGNOSIS — M810 Age-related osteoporosis without current pathological fracture: Secondary | ICD-10-CM | POA: Diagnosis not present

## 2018-03-28 DIAGNOSIS — L4050 Arthropathic psoriasis, unspecified: Secondary | ICD-10-CM | POA: Diagnosis not present

## 2018-03-28 DIAGNOSIS — K224 Dyskinesia of esophagus: Secondary | ICD-10-CM | POA: Diagnosis not present

## 2018-03-28 DIAGNOSIS — M62838 Other muscle spasm: Secondary | ICD-10-CM

## 2018-03-28 DIAGNOSIS — M533 Sacrococcygeal disorders, not elsewhere classified: Secondary | ICD-10-CM

## 2018-03-28 DIAGNOSIS — K52831 Collagenous colitis: Secondary | ICD-10-CM

## 2018-03-28 DIAGNOSIS — Z79899 Other long term (current) drug therapy: Secondary | ICD-10-CM | POA: Diagnosis not present

## 2018-03-28 DIAGNOSIS — M5030 Other cervical disc degeneration, unspecified cervical region: Secondary | ICD-10-CM

## 2018-03-28 DIAGNOSIS — I10 Essential (primary) hypertension: Secondary | ICD-10-CM | POA: Diagnosis not present

## 2018-03-28 DIAGNOSIS — M8949 Other hypertrophic osteoarthropathy, multiple sites: Secondary | ICD-10-CM

## 2018-03-28 DIAGNOSIS — M7062 Trochanteric bursitis, left hip: Secondary | ICD-10-CM

## 2018-03-28 DIAGNOSIS — L409 Psoriasis, unspecified: Secondary | ICD-10-CM

## 2018-03-28 DIAGNOSIS — Z8639 Personal history of other endocrine, nutritional and metabolic disease: Secondary | ICD-10-CM

## 2018-03-28 DIAGNOSIS — I7300 Raynaud's syndrome without gangrene: Secondary | ICD-10-CM | POA: Diagnosis not present

## 2018-03-28 DIAGNOSIS — M5134 Other intervertebral disc degeneration, thoracic region: Secondary | ICD-10-CM

## 2018-03-28 DIAGNOSIS — M150 Primary generalized (osteo)arthritis: Secondary | ICD-10-CM

## 2018-03-28 DIAGNOSIS — M159 Polyosteoarthritis, unspecified: Secondary | ICD-10-CM

## 2018-03-28 DIAGNOSIS — Z8719 Personal history of other diseases of the digestive system: Secondary | ICD-10-CM

## 2018-03-28 DIAGNOSIS — G8929 Other chronic pain: Secondary | ICD-10-CM

## 2018-03-28 MED ORDER — TRIAMCINOLONE ACETONIDE 40 MG/ML IJ SUSP
10.0000 mg | INTRAMUSCULAR | Status: AC | PRN
  Administered 2018-03-28: 10 mg via INTRAMUSCULAR

## 2018-03-28 MED ORDER — PREDNISONE 5 MG PO TABS: ORAL_TABLET | ORAL | 0 refills | Status: DC

## 2018-03-28 MED ORDER — LIDOCAINE HCL 1 % IJ SOLN
0.5000 mL | INTRAMUSCULAR | Status: AC | PRN
  Administered 2018-03-28: .5 mL

## 2018-04-01 ENCOUNTER — Other Ambulatory Visit: Payer: Self-pay | Admitting: Physician Assistant

## 2018-04-01 ENCOUNTER — Encounter

## 2018-04-01 DIAGNOSIS — Z6827 Body mass index (BMI) 27.0-27.9, adult: Secondary | ICD-10-CM | POA: Diagnosis not present

## 2018-04-01 DIAGNOSIS — S12111G Posterior displaced Type II dens fracture, subsequent encounter for fracture with delayed healing: Secondary | ICD-10-CM | POA: Diagnosis not present

## 2018-04-01 DIAGNOSIS — I10 Essential (primary) hypertension: Secondary | ICD-10-CM | POA: Diagnosis not present

## 2018-04-01 NOTE — Telephone Encounter (Signed)
last Visit: 03/28/18 Next Visit: 05/20/18 Labs: 02/28/18 WBC 12.4 Potassium 3.4 Elevated glucose   Okay to refill per Dr. Estanislado Pandy

## 2018-04-02 DIAGNOSIS — B029 Zoster without complications: Secondary | ICD-10-CM

## 2018-04-02 HISTORY — DX: Zoster without complications: B02.9

## 2018-04-03 ENCOUNTER — Telehealth: Payer: Self-pay | Admitting: Rheumatology

## 2018-04-03 NOTE — Telephone Encounter (Signed)
Patient advised prescription has been sent to the pharmacy on 04/01/18.

## 2018-04-03 NOTE — Telephone Encounter (Signed)
Patient left a voicemail requesting prescription refill of Leflunomide to be sent to [facility] in [place].

## 2018-04-15 ENCOUNTER — Telehealth: Payer: Self-pay | Admitting: Rheumatology

## 2018-04-15 MED ORDER — HYDROCODONE-ACETAMINOPHEN 5-325 MG PO TABS: ORAL_TABLET | ORAL | 0 refills | Status: DC

## 2018-04-15 NOTE — Telephone Encounter (Signed)
Patient left a voicemail requesting prescription refill of Hydrocodone to be sent to [facility] in [place].

## 2018-04-15 NOTE — Telephone Encounter (Signed)
last Visit: 03/28/18 Next Visit: 05/20/18 UDS: 03/13/18  Narc Agreement: 03/13/18   Okay to refill Hydrocodone?

## 2018-04-18 ENCOUNTER — Other Ambulatory Visit: Payer: Self-pay | Admitting: Physician Assistant

## 2018-04-18 ENCOUNTER — Telehealth: Payer: Self-pay | Admitting: Rheumatology

## 2018-04-18 MED ORDER — PREDNISONE 5 MG PO TABS: ORAL_TABLET | ORAL | 0 refills | Status: DC

## 2018-04-18 NOTE — Telephone Encounter (Signed)
Attempted to contact the patient and left message for patient to call the office to discuss need for prednisone taper.

## 2018-04-18 NOTE — Telephone Encounter (Signed)
last Visit: 03/28/18 Next Visit: 05/20/18  Okay to refill per Dr. Estanislado Pandy

## 2018-04-18 NOTE — Telephone Encounter (Signed)
Patient left a voicemail stating the prescription refill she is requesting is for Prednisone 5 mg that she takes everyday for the past 15-20 years and not the Prednisone dose pack.  Patient is requesting the prescription be sent to [facility] on [address] in [place].

## 2018-04-25 DIAGNOSIS — I10 Essential (primary) hypertension: Secondary | ICD-10-CM | POA: Diagnosis not present

## 2018-04-25 DIAGNOSIS — L4050 Arthropathic psoriasis, unspecified: Secondary | ICD-10-CM | POA: Diagnosis not present

## 2018-04-28 DIAGNOSIS — H25042 Posterior subcapsular polar age-related cataract, left eye: Secondary | ICD-10-CM | POA: Diagnosis not present

## 2018-04-28 DIAGNOSIS — E119 Type 2 diabetes mellitus without complications: Secondary | ICD-10-CM | POA: Diagnosis not present

## 2018-04-28 DIAGNOSIS — H25041 Posterior subcapsular polar age-related cataract, right eye: Secondary | ICD-10-CM | POA: Diagnosis not present

## 2018-04-28 DIAGNOSIS — H5203 Hypermetropia, bilateral: Secondary | ICD-10-CM | POA: Diagnosis not present

## 2018-05-01 ENCOUNTER — Telehealth: Payer: Self-pay | Admitting: Rheumatology

## 2018-05-01 NOTE — Telephone Encounter (Signed)
Please advise her to schedule an appointment with Dr. Arnoldo Morale as soon as possible

## 2018-05-01 NOTE — Telephone Encounter (Signed)
Patient states she is having nerve pain in her hands and tingling her hands. Patient states she has had the pain since October. Patient states the pain is from the mid part of her hand to the finger tips. Patient states she has a pin in her neck. Patient states she bumped her spinal cord when she fell. Patient states her hands are always painful. Patient states they never stop. Patient is asking for an increase in her dose of Topamax. Patient is on 200 mg currently. Patient sees Dr. Arnoldo Morale, Neurosurgeon.

## 2018-05-01 NOTE — Telephone Encounter (Signed)
Patient left a voicemail stating she takes Topamax for her nerve pain in her leg.  Patient states in October she fell and now has nerve pain in her hands all the time.  Patient is asking if she can increase the Topamax.  Patient states Dr. Estanislado Pandy gave her a dose pack after Christmas which did help with the swelling, but still has "numbness and pain when she touches things."

## 2018-05-01 NOTE — Telephone Encounter (Signed)
Patient advised to schedule an appointment with Dr. Arnoldo Morale as soon as possible. Patient verbalized understanding.

## 2018-05-04 DIAGNOSIS — J209 Acute bronchitis, unspecified: Secondary | ICD-10-CM | POA: Diagnosis not present

## 2018-05-04 DIAGNOSIS — J9801 Acute bronchospasm: Secondary | ICD-10-CM | POA: Diagnosis not present

## 2018-05-06 ENCOUNTER — Telehealth: Payer: Self-pay | Admitting: Pharmacist

## 2018-05-06 NOTE — Progress Notes (Signed)
Office Visit Note  Patient: Tonya Hall             Date of Birth: 1950/08/17           MRN: [id number]             PCP: [name], MD Referring: [name], MD Visit Date: 05/20/2018 Occupation: @GUAROCC @  Subjective:  Pain in right shoulder    History of Present Illness: Tonya Hall is a 68 y.o. female with history of psoriatic arthritis, osteoarthritis, and DDD.  She is on Cosentyx 150 mg sq every 14 days, Arava 20 mg po daily, and prednisone 5 mg po daily.  She is still having discomfort in her neck after her fall and fracture. She continues to follow up with Dr. Arnoldo Morale, her next appointment is 06/06/18. She reports bilateral hand discomfort and numbness.  States her hands are swollen in the morning to where she can't bend them and resolves throughout the day. She is using compression gloves which are helpful for the pain and swelling. She is having some discomfort in her middle back which she contributes to DDD-T.  Her SI joint pain has improved. She also reports discomfort in her bilateral feet and also states they feel puffy. She had an appointment with [facility] and did not see anything remarkable on her X-rays.  She presents with right shoulder pain.   Activities of Daily Living:  Patient reports morning stiffness for 2 hours.   Patient Reports nocturnal pain.  Difficulty dressing/grooming: Reports Difficulty climbing stairs: Reports Difficulty getting out of chair: Reports Difficulty using hands for taps, buttons, cutlery, and/or writing: Reports  Review of Systems  Constitutional: Positive for fatigue.  HENT: Positive for mouth dryness. Negative for mouth sores and nose dryness.   Eyes: Positive for dryness. Negative for pain and visual disturbance.  Respiratory: Negative for cough, hemoptysis, shortness of breath and difficulty breathing.   Cardiovascular: Positive for swelling in legs/feet. Negative for chest pain, palpitations and hypertension.    Gastrointestinal: Negative for blood in stool, constipation and diarrhea.  Endocrine: Negative for increased urination.  Genitourinary: Negative for difficulty urinating and painful urination.  Musculoskeletal: Positive for arthralgias, joint pain, joint swelling and morning stiffness. Negative for myalgias, muscle weakness, muscle tenderness and myalgias.  Skin: Negative for color change, pallor, rash, hair loss, nodules/bumps, skin tightness, ulcers and sensitivity to sunlight.  Neurological: Positive for numbness. Negative for dizziness and headaches.  Hematological: Negative for swollen glands.  Psychiatric/Behavioral: Negative for depressed mood and sleep disturbance. The patient is not nervous/anxious.     PMFS History:  Patient Active Problem List   Diagnosis Date Noted  . Closed nondisplaced odontoid fracture with type II morphology ([place]) 01/27/2018  . Odontoid fracture ([place]) 01/24/2018  . Chest pain 11/14/2017  . Transaminitis 06/22/2016  . Collagenous colitis 05/23/2016  . Psoriasis 05/21/2016  . High risk medication use 02/15/2016  . Age-related osteoporosis without current pathological fracture 02/15/2016  . Trochanteric bursitis, left hip 02/15/2016  . Sacroiliitis, not elsewhere classified ([place]) 02/15/2016  . Chronic pain syndrome 02/15/2016  . Abnormal weight gain 06/25/2012  . Hypertension 07/03/2011  . Hypercholesteremia 07/03/2011  . Psoriatic arthritis ([place]) 07/03/2011  . Osteoarthritis of multiple joints 07/03/2011  . Esophageal motility disorder 02/14/2011  . Cough 02/14/2011  . GERD 05/05/2010    Past Medical History:  Diagnosis Date  . Asthma    Albuterol in haler prn  . Cataract    immature unsure which  eye  . Chronic back pain   . Degenerative disk disease    psoriatic  . Diabetes mellitus without complication (HCC)    diet controlled  . Esophageal motility disorder    Non-specific, see modified barium study/speech path, BP  . GERD  (gastroesophageal reflux disease)    takes Protonix bid  . History of blood transfusion    post c-section  . History of bronchitis   . HTN (hypertension)   . Hyperlipidemia    takes Pravastatin daily  . Lymphocytic colitis 05/26/2010   Responded to Entocort x 3 MOS  . Neuropathy   . Osteoporosis    gets Boniva every 3 months  . Peripheral edema    takes HCTZ daily  . PONV (postoperative nausea and vomiting)   . Psoriatic arthritis ([place])    Dr. Katherina Right  . Raynaud's disease   . Seasonal allergies   . SUI (stress urinary incontinence, female)   . Urinary urgency     Family History  Problem Relation Age of Onset  . Diabetes Mother   . Pancreatitis Mother   . Arthritis Mother        psoriatic   . Fibromyalgia Mother   . Rheumatic fever Father   . Diabetes Other        grandparents  . Skin cancer Other        grandfather  . Hypertension Other        grandparent  . Congestive Heart Failure Other        grandfather  . Parkinson's disease Other        grandmother  . Colon cancer Maternal Uncle   . Fibromyalgia Sister   . Rheum arthritis Sister   . Diabetes Sister   . Fibromyalgia Sister   . Diabetes Sister   . Hypertension Son   . Colon polyps Neg Hx    Past Surgical History:  Procedure Laterality Date  . BIOPSY  04/17/2016   Procedure: BIOPSY;  Surgeon: Danie Binder, MD;  [facility];  Service: Endoscopy;;  random colon bx's  . CERVICAL SPINE SURGERY  09/09/2017  . CESAREAN SECTION  1974, 1978      . COLONOSCOPY  06/19/2008   [facility]:URKYH internal hemorrhoids/mild sigmoin colon diverticulosis  . COLONOSCOPY  2012   Dr. Gala Romney: normal rectum, diverticula, lymphocytic colitis   . COLONOSCOPY WITH PROPOFOL N/A 04/17/2016   Procedure: COLONOSCOPY WITH PROPOFOL;  Surgeon: Danie Binder, MD;  [facility];  Service: Endoscopy;  Laterality: N/A;  900  . DILATION AND CURETTAGE OF UTERUS  1977   spontaneous abortion  . ESOPHAGOGASTRODUODENOSCOPY   06/19/2008   [facility]:BJSEGBTD gastritis  . LUMBAR DISC ARTHROPLASTY  7/98  . LUMBAR FUSION  6/03, 11/08, 11/14   L4-5 fusion, L2-3 fusion, L1-2  . ODONTOID SCREW INSERTION N/A 01/27/2018   Procedure: ODONTOID SCREW INSERTION;  Surgeon: Newman Pies, MD;  Location: [place];  Service: Neurosurgery;  Laterality: N/A;  ODONTOID SCREW INSERTION  . TONSILLECTOMY AND ADENOIDECTOMY    . TUBAL LIGATION     Social History   Social History Narrative   ATTENDS [employer]. 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   Immunization History  Administered Date(s) Administered  . Influenza,inj,Quad PF,6+ Mos 01/21/2017  . Influenza-Unspecified 01/30/2017  . Pneumococcal Polysaccharide-23 02/19/2013  . Tdap 09/10/2016     Objective: Vital Signs: BP 130/68 (BP Location: Left Arm, Patient Position: Sitting, Cuff Size: Normal)   Pulse 88   Resp 18   Ht 4'  10.5" (1.486 m)   Wt 138 lb 6.4 oz (62.8 kg)   LMP 02/01/1999   BMI 28.43 kg/m    Physical Exam Vitals signs and nursing note reviewed.  Constitutional:      Appearance: She is well-developed.  HENT:     Head: Normocephalic and atraumatic.  Eyes:     Conjunctiva/sclera: Conjunctivae normal.  Neck:     Musculoskeletal: Normal range of motion.  Cardiovascular:     Rate and Rhythm: Normal rate and regular rhythm.     Heart sounds: Normal heart sounds.  Pulmonary:     Effort: Pulmonary effort is normal.     Breath sounds: Normal breath sounds.  Abdominal:     General: Bowel sounds are normal.     Palpations: Abdomen is soft.  Lymphadenopathy:     Cervical: No cervical adenopathy.  Skin:    General: Skin is warm and dry.     Capillary Refill: Capillary refill takes less than 2 seconds.  Neurological:     Mental Status: She is alert and oriented to person, place, and time.  Psychiatric:        Behavior: Behavior normal.      Musculoskeletal Exam: C-spine limited ROM with discomfort.  Mild thoracic kyphosis.  Right shoulder limited and  painful internal rotation.  Left shoulder full ROM with no discomfort. Elbow joints, wrist joints, MCPs, PIPs, and DIPs good ROM with no synovitis.  Synovial thickening of PIPs and DIPs.  Hip joints, knee joints, ankle joints, MTPs, PIPs ,and DIPs good ROM with no synovitis.  No warmth or effusion of knee joints.  No tenderness or swelling of ankle joints.  No achilles tendonitis or plantar fasciitis.  PIP and DIP synovial thickening consistent with osteoarthritis of both feet.   CDAI Exam: CDAI Score: Not documented Patient Global Assessment: Not documented; Provider Global Assessment: Not documented Swollen: Not documented; Tender: Not documented Joint Exam   Not documented   There is currently no information documented on the homunculus. Go to the Rheumatology activity and complete the homunculus joint exam.  Investigation: No additional findings.  Imaging: Dg Foot Complete Right  Result Date: 05/19/2018 Please see detailed radiograph report in office note.  Xr Shoulder Right  Result Date: 05/20/2018 No glenohumeral joint space narrowing was noted.  No chondrocalcinosis was noted.  No acromioclavicular joint space narrowing was noted. Impression: Unremarkable x-ray of the shoulder joint.   Recent Labs: Lab Results  Component Value Date   WBC 12.4 (H) 02/28/2018   HGB 12.3 02/28/2018   PLT 259 02/28/2018   NA 139 02/28/2018   K 3.4 (L) 02/28/2018   CL 110 02/28/2018   CO2 20 (L) 02/28/2018   GLUCOSE 159 (H) 02/28/2018   BUN 14 02/28/2018   CREATININE 0.85 02/28/2018   BILITOT 0.8 02/28/2018   ALKPHOS 53 02/28/2018   AST 28 02/28/2018   ALT 33 02/28/2018   PROT 7.0 02/28/2018   ALBUMIN 3.9 02/28/2018   CALCIUM 9.4 02/28/2018   GFRAA >60 02/28/2018   QFTBGOLD Negative 05/05/2015   QFTBGOLDPLUS NEGATIVE 07/09/2017    Speciality Comments: No specialty comments available.  Procedures:  No procedures performed Allergies: Adalimumab; Imuran [azathioprine sodium];  Methotrexate; Plaquenil [hydroxychloroquine sulfate]; Sulfonamide derivatives; Ceftin [cefuroxime axetil]; and Morphine    Assessment / Plan:     Visit Diagnoses: Psoriatic arthritis ([place]): She has no synovitis or dactylitis on exam.  She has severe synovial thickening of PIPs and DIPs. She has no tenderness on exam.  She  is clinically doing well on Cosentyx 150 mg subcutaneous inj every 14 days, Arava 20 mg po daily, Prednisone 5 mg po daily. She has no SI joint tenderness at this time.  No achilles tendonitis or plantar fasciitis. She will continue on this current treatment regimen.  She does not need any refills.  She was advised to notify us if she develops increased joint pain or joint swelling.  She will follow up in 5 months.   Psoriasis: She has no psoriasis at this time.   High risk medication use - Cosentyx 150 mg subcutaneous inj every 14 days, Arava 20 mg, Prednisone 5 mg TB gold: 07/09/17.  CBC and CMP will be drawn today.  Standing orders are in place.  A future order for TB gold was placed today. - Plan: COMPLETE METABOLIC PANEL WITH GFR, CBC with Differential/Platelet, QuantiFERON-TB Gold Plus  Chronic left SI joint pain: She has no left SI joint tenderness at this time.   Primary osteoarthritis involving multiple joints  DDD (degenerative disc disease), cervical: She has limited ROM with discomfort.  She continues to follow up with Dr. Arnoldo Morale.  She has an upcoming appointment on 06/06/18.   DDD (degenerative disc disease), thoracic: She has mild thoracic kyphosis.  She has no midline spinal tenderness.    Age-related osteoporosis without current pathological fracture: She is on IV Reclast per Dr. Chalmers Cater (started in 2015).   Trochanteric bursitis, left hip: She has no tenderness on exam.    Raynaud's syndrome without gangrene: She has intermittent symptoms of Raynaud's.  She has no digital ulcerations or signs of gangrene.   Chronic pain syndrome: She takes Norco 1 tablet 2-3  times per day for pain relief.   Trapezius muscle spasm: She has been having trapezius muscle tension and muscle tenderness.  She declined trigger point injections.  She was encouraged to continue to use a heating pad.   Chronic right shoulder pain - She presents today with right shoulder pain.  She has painful and limited internal rotation of the right shoulder joint.  She requested x-rays of the right shoulder which were unremarkable. She was given a handout of shoulder exercises. Plan: XR Shoulder Right   Other medical conditions are listed as follows:   Essential hypertension  Esophageal motility disorder  History of gastroesophageal reflux (GERD)  Collagenous colitis  History of type 2 diabetes mellitus    Orders: Orders Placed This Encounter  Procedures  . XR Shoulder Right  . COMPLETE METABOLIC PANEL WITH GFR  . CBC with Differential/Platelet  . QuantiFERON-TB Gold Plus   No orders of the defined types were placed in this encounter.   Face-to-face time spent with patient was 30 minutes. Greater than 50% of time was spent in counseling and coordination of care.  Follow-Up Instructions: Return in about 5 months (around 10/18/2018) for Psoriatic arthritis, Osteoarthritis, DDD.   Ofilia Neas, PA-C  Note - This record has been created using Dragon software.  Chart creation errors have been sought, but may not always  have been located. Such creation errors do not reflect on  the standard of medical care.

## 2018-05-06 NOTE — Telephone Encounter (Signed)
Received voicemail from patient inquiring about [employer] patient assistance program.  She called for a shipment of her medication and was informed that she did not renew for the new year.  She believes that she completed the application in November when I patient advocate, Apolonio Schneiders reached out to her.  I cannot find the application scanned into epic or in our files.  Patient is stopping by with income documents and will sign a new application that we can fax in.  She still has medication on hand as [employer] still shipping her medication for this month.  We will update patient when we receive a response.

## 2018-05-07 NOTE — Telephone Encounter (Signed)
Patient called to let us know that [employer] has her application.  They called her to schedule shipment and informed her they had her information.  They were still processing her application when they first called her for shipment.  Instructed patient to call us if she has any other issues.

## 2018-05-19 ENCOUNTER — Ambulatory Visit (INDEPENDENT_AMBULATORY_CARE_PROVIDER_SITE_OTHER): Payer: PPO

## 2018-05-19 ENCOUNTER — Other Ambulatory Visit: Payer: Self-pay | Admitting: Podiatrist

## 2018-05-19 ENCOUNTER — Ambulatory Visit: Payer: PPO | Admitting: Podiatrist

## 2018-05-19 DIAGNOSIS — L817 Pigmented purpuric dermatosis: Secondary | ICD-10-CM

## 2018-05-19 DIAGNOSIS — M79671 Pain in right foot: Secondary | ICD-10-CM

## 2018-05-19 DIAGNOSIS — M7751 Other enthesopathy of right foot: Secondary | ICD-10-CM

## 2018-05-19 DIAGNOSIS — E1151 Type 2 diabetes mellitus with diabetic peripheral angiopathy without gangrene: Secondary | ICD-10-CM | POA: Diagnosis not present

## 2018-05-19 DIAGNOSIS — L84 Corns and callosities: Secondary | ICD-10-CM | POA: Diagnosis not present

## 2018-05-19 NOTE — Patient Instructions (Signed)
Raynaud Phenomenon    Raynaud phenomenon is a condition that affects the blood vessels (arteries) that carry blood to your fingers and toes. The arteries that supply blood to your ears, lips, nipples, or the tip of your nose might also be affected. Raynaud phenomenon causes the arteries to become narrow temporarily (spasm). As a result, the flow of blood to the affected areas is temporarily decreased. This usually occurs in response to cold temperatures or stress. During an attack, the skin in the affected areas turns white, then blue, and finally red. You may also feel tingling or numbness in those areas.  Attacks usually last for only a brief period, and then the blood flow to the area returns to normal. In most cases, Raynaud phenomenon does not cause serious health problems.  What are the causes?  In many cases, the cause of this condition is not known. The condition may occur on its own (primary Raynaud phenomenon) or may be associated with other diseases or factors (secondary Raynaud phenomenon).  Possible causes may include:  · Diseases or medical conditions that damage the arteries.  · Injuries and repetitive actions that hurt the hands or feet.  · Being exposed to certain chemicals.  · Taking medicines that narrow the arteries.  · Other medical conditions, such as lupus, scleroderma, rheumatoid arthritis, thyroid problems, blood disorders, Sjogren syndrome, or atherosclerosis.  What increases the risk?  The following factors may make you more likely to develop this condition:  · Being 20-40 years old.  · Being female.  · Having a family history of Raynaud phenomenon.  · Living in a cold climate.  · Smoking.  What are the signs or symptoms?  Symptoms of this condition usually occur when you are exposed to cold temperatures or when you have emotional stress. The symptoms may last for a few minutes or up to several hours. They usually affect your fingers but may also affect your toes, nipples, lips, ears, or  the tip of your nose. Symptoms may include:  · Changes in skin color. The skin in the affected areas will turn pale or white. The skin may then change from white to bluish to red as normal blood flow returns to the area.  · Numbness, tingling, or pain in the affected areas.  In severe cases, symptoms may include:  · Skin sores.  · Tissues decaying and dying (gangrene).  How is this diagnosed?  This condition may be diagnosed based on:  · Your symptoms and medical history.  · A physical exam. During the exam, you may be asked to put your hands in cold water to check for a reaction to cold temperature.  · Tests, such as:  ? Blood tests to check for other diseases or conditions.  ? A test to check the movement of blood through your arteries and veins (vascular ultrasound).  ? A test in which the skin at the base of your fingernail is examined under a microscope (nailfold capillaroscopy).  How is this treated?  Treatment for this condition often involves making lifestyle changes and taking steps to control your exposure to cold temperatures. For more severe cases, medicine (calcium channel blockers) may be used to improve blood flow. Surgery is sometimes done to block the nerves that control the affected arteries, but this is rare.  Follow these instructions at home:  Avoiding cold temperatures  Take these steps to avoid exposure to cold:  · If possible, stay indoors during cold weather.  · When you   go outside during cold weather, dress in layers and wear mittens, a hat, a scarf, and warm footwear.  · Wear mittens or gloves when handling ice or frozen food.  · Use holders for glasses or cans containing cold drinks.  · Let warm water run for a while before taking a shower or bath.  · Warm up the car before driving in cold weather.  Lifestyle    · If possible, avoid stressful and emotional situations. Try to find ways to manage your stress, such as:  ? Exercise.  ? Yoga.  ? Meditation.  ? Biofeedback.  · Do not use any  products that contain nicotine or tobacco, such as cigarettes and e-cigarettes. If you need help quitting, ask your health care provider.  · Avoid secondhand smoke.  · Limit your use of caffeine.  ? Switch to decaffeinated coffee, tea, and soda.  ? Avoid chocolate.  · Avoid vibrating tools and machinery.  General instructions  · Protect your hands and feet from injuries, cuts, or bruises.  · Avoid wearing tight rings or wristbands.  · Wear loose fitting socks and comfortable, roomy shoes.  · Take over-the-counter and prescription medicines only as told by your health care provider.  Contact a health care provider if:  · Your discomfort becomes worse despite lifestyle changes.  · You develop sores on your fingers or toes that do not heal.  · Your fingers or toes turn black.  · You have breaks in the skin on your fingers or toes.  · You have a fever.  · You have pain or swelling in your joints.  · You have a rash.  · Your symptoms occur on only one side of your body.  Summary  · Raynaud phenomenon is a condition that affects the arteries that carry blood to your fingers, toes, ears, lips, nipples, or the tip of your nose.  · In many cases, the cause of this condition is not known.  · Symptoms of this condition include changes in skin color, and numbness and tingling of the affected area.  · Treatment for this condition includes lifestyle changes, reducing exposure to cold temperatures, and using medicines for severe cases of the condition.  · Contact your health care provider if your condition worsens despite treatment.  This information is not intended to replace advice given to you by your health care provider. Make sure you discuss any questions you have with your health care provider.  Document Released: 03/16/2000 Document Revised: 10/02/2016 Document Reviewed: 04/30/2016  Elsevier Interactive Patient Education © 2019 [employer]

## 2018-05-19 NOTE — Progress Notes (Signed)
    Chief Complaint  Patient presents with  . Foot Problem    Redness all over feet bilateral  . Foot Problem    Red spot medial 1st toe right foot, 2 yr duration  . Toe Pain    Right 1st toe medial side pain even at rest 4 day duration no known cause feels like stepping on a rock  . Nail Problem    Nails 1-5 "look weird"  . Callouses    Bilateral plantar callous and heel callous        Review of Systems  DATA OBTAINED: from patient  GENERAL: Feels well no fevers, no fatigue, no changes in appetite SKIN: No itching, no rashes, no open wounds EYES: No eye pain,no redness, no discharge EARS: No earache,no ringing of ears, NOSE: No congestion, no drainage, no bleeding  MOUTH/THROAT: No mouth pain, No sore throat, No difficulty chewing or swallowing  RESPIRATORY: No cough, no wheezing, no SOB CARDIAC: No chest pain,no heart palpitations, GI: No abdominal pain, No Nausea, no vomiting, no diarrhea, no heartburn or no reflux  GU: No dysuria, no increased frequency or urgency MUSCULOSKELETAL: No unrelieved bone/joint pain,  NEUROLOGIC: Awake, alert, appropriate to situation, No change in mental status. PSYCHIATRIC: No overt anxiety or sadness.No behavior issue.      Physical Exam  GENERAL APPEARANCE: Alert, conversant. Appropriately groomed. No acute distress.  VASCULAR: Pedal pulses palpable DP and PT bilateral.  Capillary refill time is immediate to all digits,  Proximal to distal cooling it warm to cool with red to purple discoloration noted consistent with raynauds.  Digital hair growth is absent bilateral  NEUROLOGIC: sensation is intact epicritically and protectively to 5.07 monofilament at 5/5 sites bilateral.  Light touch is intact bilateral, vibratory sensation intact bilateral, achilles tendon reflex is intact bilateral.  MUSCULOSKELETAL: acceptable muscle strength, tone and stability bilateral. Small buion on the right foot is present with a red skin pressure area  overlying.  Pain at the hallux ip joint medially with direct pressure. Hammertoe 2-5 bilateral DERMATOLOGIC: skin is warm, supple, and dry. Left foot has multiple cayenne pepper petechiae which are not raised and not itchy or painful. Right foot appears unaffected.  No open lesions noted.  No interdigital maceration noted bilateral. Digital nails are brittle with multiple nails having split and having psoriatic ridging present.  Bilateral heels have mild calluses present- mild callus medial right hallux present  Radiographic exam: Mild bunion present right.  Digital contracture 2,3,4 right noted.  Normal osseous mineralization.  No fracture or dislocation or acute osseous abnormalities present.  Joint spaces are normal.      Assessment   Bunion with associated bursitis right Heel callus bilateral Pigmented purpuric dermatoses left foot raynauds syndrome Psoriatic toenails with ridging  Plan  Debrided all calluses and toenails.  Discussed her pain could be from the raynauds.  Discussed wider shoes to keep pressure off the bunion area of the foot. Debrided the toenails today as well.

## 2018-05-20 ENCOUNTER — Other Ambulatory Visit: Payer: Self-pay | Admitting: Rheumatology

## 2018-05-20 ENCOUNTER — Ambulatory Visit: Payer: PPO | Admitting: Physician Assistant

## 2018-05-20 ENCOUNTER — Encounter: Payer: Self-pay | Admitting: Physician Assistant

## 2018-05-20 ENCOUNTER — Ambulatory Visit (INDEPENDENT_AMBULATORY_CARE_PROVIDER_SITE_OTHER): Payer: PPO

## 2018-05-20 ENCOUNTER — Telehealth: Payer: Self-pay | Admitting: *Deleted

## 2018-05-20 VITALS — BP 130/68 | HR 88 | Resp 18 | Ht 58.5 in | Wt 138.4 lb

## 2018-05-20 DIAGNOSIS — M8949 Other hypertrophic osteoarthropathy, multiple sites: Secondary | ICD-10-CM

## 2018-05-20 DIAGNOSIS — L409 Psoriasis, unspecified: Secondary | ICD-10-CM | POA: Diagnosis not present

## 2018-05-20 DIAGNOSIS — M810 Age-related osteoporosis without current pathological fracture: Secondary | ICD-10-CM | POA: Diagnosis not present

## 2018-05-20 DIAGNOSIS — G8929 Other chronic pain: Secondary | ICD-10-CM

## 2018-05-20 DIAGNOSIS — Z79899 Other long term (current) drug therapy: Secondary | ICD-10-CM

## 2018-05-20 DIAGNOSIS — M150 Primary generalized (osteo)arthritis: Secondary | ICD-10-CM | POA: Diagnosis not present

## 2018-05-20 DIAGNOSIS — M7062 Trochanteric bursitis, left hip: Secondary | ICD-10-CM

## 2018-05-20 DIAGNOSIS — K224 Dyskinesia of esophagus: Secondary | ICD-10-CM

## 2018-05-20 DIAGNOSIS — M5134 Other intervertebral disc degeneration, thoracic region: Secondary | ICD-10-CM | POA: Diagnosis not present

## 2018-05-20 DIAGNOSIS — K52831 Collagenous colitis: Secondary | ICD-10-CM

## 2018-05-20 DIAGNOSIS — M159 Polyosteoarthritis, unspecified: Secondary | ICD-10-CM

## 2018-05-20 DIAGNOSIS — M25511 Pain in right shoulder: Secondary | ICD-10-CM | POA: Diagnosis not present

## 2018-05-20 DIAGNOSIS — I7300 Raynaud's syndrome without gangrene: Secondary | ICD-10-CM

## 2018-05-20 DIAGNOSIS — M5030 Other cervical disc degeneration, unspecified cervical region: Secondary | ICD-10-CM

## 2018-05-20 DIAGNOSIS — M62838 Other muscle spasm: Secondary | ICD-10-CM

## 2018-05-20 DIAGNOSIS — L4050 Arthropathic psoriasis, unspecified: Secondary | ICD-10-CM

## 2018-05-20 DIAGNOSIS — I10 Essential (primary) hypertension: Secondary | ICD-10-CM

## 2018-05-20 DIAGNOSIS — G894 Chronic pain syndrome: Secondary | ICD-10-CM

## 2018-05-20 DIAGNOSIS — Z8719 Personal history of other diseases of the digestive system: Secondary | ICD-10-CM

## 2018-05-20 DIAGNOSIS — Z8639 Personal history of other endocrine, nutritional and metabolic disease: Secondary | ICD-10-CM

## 2018-05-20 DIAGNOSIS — M533 Sacrococcygeal disorders, not elsewhere classified: Secondary | ICD-10-CM | POA: Diagnosis not present

## 2018-05-20 MED ORDER — NONFORMULARY OR COMPOUNDED ITEM: 5 refills | Status: DC

## 2018-05-20 NOTE — Telephone Encounter (Signed)
Orders faxed to [facility] 

## 2018-05-20 NOTE — Telephone Encounter (Signed)
Last Visit: 03/28/2018 Next Visit: 05/20/2018   Okay to refill per Dr. Estanislado Pandy.

## 2018-05-20 NOTE — Patient Instructions (Signed)
Shoulder Exercises Ask your health care provider which exercises are safe for you. Do exercises exactly as told by your health care provider and adjust them as directed. It is normal to feel mild stretching, pulling, tightness, or discomfort as you do these exercises, but you should stop right away if you feel sudden pain or your pain gets worse.Do not begin these exercises until told by your health care provider. Range of Motion Exercises        These exercises warm up your muscles and joints and improve the movement and flexibility of your shoulder. These exercises also help to relieve pain, numbness, and tingling. These exercises involve stretching your injured shoulder directly. Exercise A: Pendulum 1. Stand near a wall or a surface that you can hold onto for balance. 2. Bend at the waist and let your left / right arm hang straight down. Use your other arm to support you. Keep your back straight and do not lock your knees. 3. Relax your left / right arm and shoulder muscles, and move your hips and your trunk so your left / right arm swings freely. Your arm should swing because of the motion of your body, not because you are using your arm or shoulder muscles. 4. Keep moving your body so your arm swings in the following directions, as told by your health care provider: ? Side to side. ? Forward and backward. ? In clockwise and counterclockwise circles. 5. Continue each motion for __________ seconds, or for as long as told by your health care provider. 6. Slowly return to the starting position. Repeat __________ times. Complete this exercise __________ times a day. Exercise B:Flexion, Standing 1. Stand and hold a broomstick, a cane, or a similar object. Place your hands a little more than shoulder-width apart on the object. Your left / right hand should be palm-up, and your other hand should be palm-down. 2. Keep your elbow straight and keep your shoulder muscles relaxed. Push the stick  down with your healthy arm to raise your left / right arm in front of your body, and then over your head until you feel a stretch in your shoulder. ? Avoid shrugging your shoulder while you raise your arm. Keep your shoulder blade tucked down toward the middle of your back. 3. Hold for __________ seconds. 4. Slowly return to the starting position. Repeat __________ times. Complete this exercise __________ times a day. Exercise C: Abduction, Standing 1. Stand and hold a broomstick, a cane, or a similar object. Place your hands a little more than shoulder-width apart on the object. Your left / right hand should be palm-up, and your other hand should be palm-down. 2. While keeping your elbow straight and your shoulder muscles relaxed, push the stick across your body toward your left / right side. Raise your left / right arm to the side of your body and then over your head until you feel a stretch in your shoulder. ? Do not raise your arm above shoulder height, unless your health care provider tells you to do that. ? Avoid shrugging your shoulder while you raise your arm. Keep your shoulder blade tucked down toward the middle of your back. 3. Hold for __________ seconds. 4. Slowly return to the starting position. Repeat __________ times. Complete this exercise __________ times a day. Exercise D:Internal Rotation 1. Place your left / right hand behind your back, palm-up. 2. Use your other hand to dangle an exercise band, a towel, or a similar object over your shoulder.   Grasp the band with your left / right hand so you are holding onto both ends. 3. Gently pull up on the band until you feel a stretch in the front of your left / right shoulder. ? Avoid shrugging your shoulder while you raise your arm. Keep your shoulder blade tucked down toward the middle of your back. 4. Hold for __________ seconds. 5. Release the stretch by letting go of the band and lowering your hands. Repeat __________ times.  Complete this exercise __________ times a day. Stretching Exercises  These exercises warm up your muscles and joints and improve the movement and flexibility of your shoulder. These exercises also help to relieve pain, numbness, and tingling. These exercises are done using your healthy shoulder to help stretch the muscles of your injured shoulder. Exercise E: Corner Stretch (External Rotation and Abduction) 1. Stand in a doorway with one of your feet slightly in front of the other. This is called a staggered stance. If you cannot reach your forearms to the door frame, stand facing a corner of a room. 2. Choose one of the following positions as told by your health care provider: ? Place your hands and forearms on the door frame above your head. ? Place your hands and forearms on the door frame at the height of your head. ? Place your hands on the door frame at the height of your elbows. 3. Slowly move your weight onto your front foot until you feel a stretch across your chest and in the front of your shoulders. Keep your head and chest upright and keep your abdominal muscles tight. 4. Hold for __________ seconds. 5. To release the stretch, shift your weight to your back foot. Repeat __________ times. Complete this stretch __________ times a day. Exercise F:Extension, Standing 1. Stand and hold a broomstick, a cane, or a similar object behind your back. ? Your hands should be a little wider than shoulder-width apart. ? Your palms should face away from your back. 2. Keeping your elbows straight and keeping your shoulder muscles relaxed, move the stick away from your body until you feel a stretch in your shoulder. ? Avoid shrugging your shoulders while you move the stick. Keep your shoulder blade tucked down toward the middle of your back. 3. Hold for __________ seconds. 4. Slowly return to the starting position. Repeat __________ times. Complete this exercise __________ times a  day. Strengthening Exercises           These exercises build strength and endurance in your shoulder. Endurance is the ability to use your muscles for a long time, even after they get tired. Exercise G:External Rotation 1. Sit in a stable chair without armrests. 2. Secure an exercise band at elbow height on your left / right side. 3. Place a soft object, such as a folded towel or a small pillow, between your left / right upper arm and your body to move your elbow a few inches away (about 10 cm) from your side. 4. Hold the end of the band so it is tight and there is no slack. 5. Keeping your elbow pressed against the soft object, move your left / right forearm out, away from your abdomen. Keep your body steady so only your forearm moves. 6. Hold for __________ seconds. 7. Slowly return to the starting position. Repeat __________ times. Complete this exercise __________ times a day. Exercise H:Shoulder Abduction 1. Sit in a stable chair without armrests, or stand. 2. Hold a __________ weight in your   left / right hand, or hold an exercise band with both hands. 3. Start with your arms straight down and your left / right palm facing in, toward your body. 4. Slowly lift your left / right hand out to your side. Do not lift your hand above shoulder height unless your health care provider tells you that this is safe. ? Keep your arms straight. ? Avoid shrugging your shoulder while you do this movement. Keep your shoulder blade tucked down toward the middle of your back. 5. Hold for __________ seconds. 6. Slowly lower your arm, and return to the starting position. Repeat __________ times. Complete this exercise __________ times a day. Exercise I:Shoulder Extension 1. Sit in a stable chair without armrests, or stand. 2. Secure an exercise band to a stable object in front of you where it is at shoulder height. 3. Hold one end of the exercise band in each hand. Your palms should face each  other. 4. Straighten your elbows and lift your hands up to shoulder height. 5. Step back, away from the secured end of the exercise band, until the band is tight and there is no slack. 6. Squeeze your shoulder blades together as you pull your hands down to the sides of your thighs. Stop when your hands are straight down by your sides. Do not let your hands go behind your body. 7. Hold for __________ seconds. 8. Slowly return to the starting position. Repeat __________ times. Complete this exercise __________ times a day. Exercise J:Standing Shoulder Row 1. Sit in a stable chair without armrests, or stand. 2. Secure an exercise band to a stable object in front of you so it is at waist height. 3. Hold one end of the exercise band in each hand. Your palms should be in a thumbs-up position. 4. Bend each of your elbows to an "L" shape (about 90 degrees) and keep your upper arms at your sides. 5. Step back until the band is tight and there is no slack. 6. Slowly pull your elbows back behind you. 7. Hold for __________ seconds. 8. Slowly return to the starting position. Repeat __________ times. Complete this exercise __________ times a day. Exercise K:Shoulder Press-Ups 1. Sit in a stable chair that has armrests. Sit upright, with your feet flat on the floor. 2. Put your hands on the armrests so your elbows are bent and your fingers are pointing forward. Your hands should be about even with the sides of your body. 3. Push down on the armrests and use your arms to lift yourself off of the chair. Straighten your elbows and lift yourself up as much as you comfortably can. ? Move your shoulder blades down, and avoid letting your shoulders move up toward your ears. ? Keep your feet on the ground. As you get stronger, your feet should support less of your body weight as you lift yourself up. 4. Hold for __________ seconds. 5. Slowly lower yourself back into the chair. Repeat __________ times. Complete  this exercise __________ times a day. Exercise L: Wall Push-Ups 1. Stand so you are facing a stable wall. Your feet should be about one arm-length away from the wall. 2. Lean forward and place your palms on the wall at shoulder height. 3. Keep your feet flat on the floor as you bend your elbows and lean forward toward the wall. 4. Hold for __________ seconds. 5. Straighten your elbows to push yourself back to the starting position. Repeat __________ times. Complete this exercise __________ times   a day. This information is not intended to replace advice given to you by your health care provider. Make sure you discuss any questions you have with your health care provider. Document Released: 01/31/2005 Document Revised: 07/23/2017 Document Reviewed: 11/28/2014 Elsevier Interactive Patient Education  2019 Elsevier Inc.  

## 2018-05-21 LAB — COMPLETE METABOLIC PANEL WITH GFR
AG Ratio: 1.9 (calc) (ref 1.0–2.5)
ALT: 30 U/L — ABNORMAL HIGH (ref 6–29)
AST: 26 U/L (ref 10–35)
Albumin: 4.1 g/dL (ref 3.6–5.1)
Alkaline phosphatase (APISO): 50 U/L (ref 37–153)
BUN: 18 mg/dL (ref 7–25)
CO2: 25 mmol/L (ref 20–32)
Calcium: 9.6 mg/dL (ref 8.6–10.4)
Chloride: 105 mmol/L (ref 98–110)
Creat: 0.82 mg/dL (ref 0.50–0.99)
GFR, Est African American: 85 mL/min/{1.73_m2} (ref >=60)
GFR, Est Non African American: 74 mL/min/{1.73_m2} (ref >=60)
Globulin: 2.2 g/dL (calc) (ref 1.9–3.7)
Glucose, Bld: 121 mg/dL — ABNORMAL HIGH (ref 65–99)
Potassium: 4.6 mmol/L (ref 3.5–5.3)
Sodium: 137 mmol/L (ref 135–146)
Total Bilirubin: 0.2 mg/dL (ref 0.2–1.2)
Total Protein: 6.3 g/dL (ref 6.1–8.1)

## 2018-05-21 LAB — CBC WITH DIFFERENTIAL/PLATELET
Absolute Monocytes: 1056 cells/uL — ABNORMAL HIGH (ref 200–950)
Basophils Absolute: 128 cells/uL (ref 0–200)
Basophils Relative: 0.8 %
Eosinophils Absolute: 448 cells/uL (ref 15–500)
Eosinophils Relative: 2.8 %
HCT: 40.1 % (ref 35.0–45.0)
Hemoglobin: 13.4 g/dL (ref 11.7–15.5)
Lymphs Abs: 1568 cells/uL (ref 850–3900)
MCH: 31.6 pg (ref 27.0–33.0)
MCHC: 33.4 g/dL (ref 32.0–36.0)
MCV: 94.6 fL (ref 80.0–100.0)
MPV: 11.7 fL (ref 7.5–12.5)
Monocytes Relative: 6.6 %
Neutro Abs: 12800 cells/uL — ABNORMAL HIGH (ref 1500–7800)
Neutrophils Relative %: 80 %
Platelets: 250 10*3/uL (ref 140–400)
RBC: 4.24 10*6/uL (ref 3.80–5.10)
RDW: 12.5 % (ref 11.0–15.0)
Total Lymphocyte: 9.8 %
WBC: 16 10*3/uL — ABNORMAL HIGH (ref 3.8–10.8)

## 2018-05-21 NOTE — Progress Notes (Signed)
Glucose is 121. ALT is borderline elevated. Please advise patient to avoid tylenol and alcohol.  WBC count is elevated.  She recently was diagnosed with bronchitis and was on high dose prednisone.  Please ask if the patient is still symptomatic or febrile. She is on long-term prednisone 5 mg po daily.

## 2018-05-23 DIAGNOSIS — R51 Headache: Secondary | ICD-10-CM | POA: Diagnosis not present

## 2018-05-30 ENCOUNTER — Telehealth: Payer: Self-pay | Admitting: Rheumatology

## 2018-05-30 MED ORDER — HYDROCODONE-ACETAMINOPHEN 5-325 MG PO TABS: ORAL_TABLET | ORAL | 0 refills | Status: DC

## 2018-05-30 NOTE — Telephone Encounter (Signed)
Patient left a voicemail requesting prescription refill of Hydrocodone to be sent to [facility] on [address] in [place].

## 2018-05-30 NOTE — Telephone Encounter (Signed)
Last Visit: 05/20/18 Next Visit: 10/21/18 UDS: 03/13/18 Narc Agreement: 03/13/18  Okay to refill Hydrocodone?  

## 2018-06-02 ENCOUNTER — Telehealth: Payer: Self-pay | Admitting: Rheumatology

## 2018-06-02 NOTE — Telephone Encounter (Signed)
Patient advised that her prescription was sent to the pharmacy on 05/30/18.

## 2018-06-02 NOTE — Telephone Encounter (Signed)
Patient left a voicemail checking on her prescription of Hydrocodone.  Patient stated she left a message on Friday for the refill to be sent to [facility].

## 2018-06-05 ENCOUNTER — Telehealth: Payer: Self-pay | Admitting: Pharmacy Technician

## 2018-06-05 NOTE — Telephone Encounter (Signed)
Received a Prior Authorization request from [place] for [place]. Authorization has been submitted to patient's insurance via Cover My Meds. Will update once we receive a response.

## 2018-06-05 NOTE — Telephone Encounter (Signed)
Called to check status of patient's application, it is still pending. Will follow up.

## 2018-06-11 ENCOUNTER — Telehealth: Payer: Self-pay | Admitting: Podiatrist

## 2018-06-11 NOTE — Telephone Encounter (Signed)
Received fax from [employer], patient's renewal application has been APPROVED. Coverage dates are from 06/11/2018 to 04/02/2019.   Will send documents to [facility].   Phone# [phone] Fax# [phone]

## 2018-06-11 NOTE — Telephone Encounter (Signed)
My heels have been cracking since I was in the office. I was wondering if there was a cream or lotion I can use to help as this has not happened before.

## 2018-06-11 NOTE — Telephone Encounter (Signed)
Received a fax from [place] regarding a prior authorization for [place]. Authorization has been APPROVED from 06/05/2018 to 06/06/2019.   Will send document to scan center.

## 2018-06-13 ENCOUNTER — Other Ambulatory Visit (HOSPITAL_COMMUNITY): Payer: Self-pay | Admitting: Neurosurgery

## 2018-06-13 DIAGNOSIS — S12111G Posterior displaced Type II dens fracture, subsequent encounter for fracture with delayed healing: Secondary | ICD-10-CM | POA: Diagnosis not present

## 2018-06-13 DIAGNOSIS — Z6827 Body mass index (BMI) 27.0-27.9, adult: Secondary | ICD-10-CM | POA: Diagnosis not present

## 2018-06-13 DIAGNOSIS — R51 Headache: Principal | ICD-10-CM

## 2018-06-13 DIAGNOSIS — R519 Headache, unspecified: Secondary | ICD-10-CM

## 2018-06-13 DIAGNOSIS — I10 Essential (primary) hypertension: Secondary | ICD-10-CM | POA: Diagnosis not present

## 2018-06-13 DIAGNOSIS — G8929 Other chronic pain: Secondary | ICD-10-CM

## 2018-06-13 DIAGNOSIS — M4312 Spondylolisthesis, cervical region: Secondary | ICD-10-CM | POA: Diagnosis not present

## 2018-06-14 ENCOUNTER — Other Ambulatory Visit: Payer: Self-pay | Admitting: Rheumatology

## 2018-06-16 NOTE — Telephone Encounter (Signed)
Last Visit: 05/20/18 Next visit: 10/21/18  Okay to refill per Dr. Deveshwar  

## 2018-06-17 ENCOUNTER — Other Ambulatory Visit (HOSPITAL_COMMUNITY): Payer: Self-pay | Admitting: Neurosurgery

## 2018-06-17 DIAGNOSIS — G8929 Other chronic pain: Secondary | ICD-10-CM

## 2018-06-17 DIAGNOSIS — R51 Headache: Principal | ICD-10-CM

## 2018-06-17 DIAGNOSIS — R519 Headache, unspecified: Secondary | ICD-10-CM

## 2018-06-19 ENCOUNTER — Ambulatory Visit (HOSPITAL_COMMUNITY): Payer: PPO

## 2018-06-19 ENCOUNTER — Encounter (HOSPITAL_COMMUNITY): Payer: Self-pay

## 2018-06-24 ENCOUNTER — Other Ambulatory Visit: Payer: Self-pay

## 2018-06-24 ENCOUNTER — Ambulatory Visit (HOSPITAL_COMMUNITY)
Admission: RE | Admit: 2018-06-24 | Discharge: 2018-06-24 | Disposition: A | Discharge status: Home / Self Care | Payer: PPO | Source: Ambulatory Visit | Attending: Neurosurgery | Admitting: Neurosurgery

## 2018-06-24 DIAGNOSIS — R519 Headache, unspecified: Secondary | ICD-10-CM

## 2018-06-24 DIAGNOSIS — G8929 Other chronic pain: Secondary | ICD-10-CM

## 2018-06-24 DIAGNOSIS — R51 Headache: Secondary | ICD-10-CM | POA: Diagnosis not present

## 2018-07-15 DIAGNOSIS — M7062 Trochanteric bursitis, left hip: Secondary | ICD-10-CM | POA: Diagnosis not present

## 2018-07-16 ENCOUNTER — Other Ambulatory Visit: Payer: Self-pay | Admitting: Rheumatology

## 2018-07-16 DIAGNOSIS — E119 Type 2 diabetes mellitus without complications: Secondary | ICD-10-CM | POA: Diagnosis not present

## 2018-07-16 DIAGNOSIS — E559 Vitamin D deficiency, unspecified: Secondary | ICD-10-CM | POA: Diagnosis not present

## 2018-07-16 NOTE — Telephone Encounter (Signed)
Last Visit: 05/20/18 Next visit: 10/21/18  Okay to refill per Dr. Estanislado Pandy

## 2018-07-17 ENCOUNTER — Other Ambulatory Visit: Payer: Self-pay | Admitting: *Deleted

## 2018-07-17 NOTE — Telephone Encounter (Signed)
Last Visit: 05/20/18 Next Visit: 10/21/18 UDS: 03/13/18 Narc Agreement: 03/13/18  Okay to refill Hydrocodone?

## 2018-07-17 NOTE — Telephone Encounter (Signed)
Patient requesting hydrocodone refill to [facility]. Patient had bursa injection with Dr. Lynann Bologna. Patient's contact [phone].

## 2018-07-18 MED ORDER — HYDROCODONE-ACETAMINOPHEN 5-325 MG PO TABS: ORAL_TABLET | ORAL | 0 refills | Status: DC

## 2018-07-19 ENCOUNTER — Other Ambulatory Visit: Payer: Self-pay | Admitting: Rheumatology

## 2018-07-21 NOTE — Telephone Encounter (Signed)
Last Visit: 05/20/2018 Next Visit: 10/21/2018 Labs: 05/20/2018 Glucose is 121. ALT is borderline elevated. WBC count is elevated.   Okay to refill per Dr. Estanislado Pandy.

## 2018-07-23 DIAGNOSIS — E559 Vitamin D deficiency, unspecified: Secondary | ICD-10-CM | POA: Diagnosis not present

## 2018-07-23 DIAGNOSIS — I10 Essential (primary) hypertension: Secondary | ICD-10-CM | POA: Diagnosis not present

## 2018-07-23 DIAGNOSIS — E119 Type 2 diabetes mellitus without complications: Secondary | ICD-10-CM | POA: Diagnosis not present

## 2018-07-23 DIAGNOSIS — M810 Age-related osteoporosis without current pathological fracture: Secondary | ICD-10-CM | POA: Diagnosis not present

## 2018-07-23 DIAGNOSIS — E7800 Pure hypercholesterolemia, unspecified: Secondary | ICD-10-CM | POA: Diagnosis not present

## 2018-07-23 DIAGNOSIS — R5383 Other fatigue: Secondary | ICD-10-CM | POA: Diagnosis not present

## 2018-07-23 DIAGNOSIS — E2749 Other adrenocortical insufficiency: Secondary | ICD-10-CM | POA: Diagnosis not present

## 2018-07-24 ENCOUNTER — Telehealth: Payer: Self-pay | Admitting: Rheumatology

## 2018-07-24 NOTE — Telephone Encounter (Signed)
Patient left a voicemail requesting prescription refill of Cosentyx to be sent to [employer] patient assistance.

## 2018-07-25 DIAGNOSIS — I10 Essential (primary) hypertension: Secondary | ICD-10-CM | POA: Diagnosis not present

## 2018-07-25 DIAGNOSIS — M791 Myalgia, unspecified site: Secondary | ICD-10-CM | POA: Diagnosis not present

## 2018-07-25 DIAGNOSIS — E785 Hyperlipidemia, unspecified: Secondary | ICD-10-CM | POA: Diagnosis not present

## 2018-07-25 DIAGNOSIS — B029 Zoster without complications: Secondary | ICD-10-CM | POA: Diagnosis not present

## 2018-07-25 MED ORDER — SECUKINUMAB 150 MG/ML ~~LOC~~ SOAJ: 150.0000 mg | SUBCUTANEOUS | 0 refills | Status: DC

## 2018-07-25 NOTE — Telephone Encounter (Signed)
Last Visit: 05/20/2018 Next Visit: 10/21/2018 Labs: 05/20/2018 Glucose is 121. ALT is borderline elevated. WBC count is elevated.  TB Gold: 07/19/17 Neg   Left message to advise patient she is due to update TB Gold  Okay to refill per Dr. Estanislado Pandy

## 2018-07-28 ENCOUNTER — Telehealth: Payer: Self-pay | Admitting: Rheumatology

## 2018-07-28 NOTE — Telephone Encounter (Signed)
Patient was diagnosed with shingles on Friday 07/25/2018. Patient is on valtrex for 7 days and has already been taking gabapentin, topamax, nortriptyline.  Patient is on cosentyx and arava.

## 2018-07-28 NOTE — Telephone Encounter (Signed)
I called patient and advised that she should hold Cosentyx and Arava until the shingles dry up.

## 2018-07-28 NOTE — Telephone Encounter (Signed)
Patient called stating she received a message from Seth Bake that she needed labwork for TB Gold, but has been diagnosed with Shingles.  Patient requested a return call.

## 2018-08-05 ENCOUNTER — Encounter: Payer: Self-pay | Admitting: Gastroenterology

## 2018-08-12 ENCOUNTER — Other Ambulatory Visit: Payer: Self-pay | Admitting: Rheumatology

## 2018-08-12 NOTE — Telephone Encounter (Signed)
Last Visit: 05/20/2018 Next Visit: 10/21/2018  Okay to refill per Dr. Estanislado Pandy

## 2018-08-18 ENCOUNTER — Ambulatory Visit: Payer: PPO | Admitting: Podiatry

## 2018-08-18 DIAGNOSIS — B029 Zoster without complications: Secondary | ICD-10-CM | POA: Diagnosis not present

## 2018-08-28 ENCOUNTER — Other Ambulatory Visit: Payer: Self-pay | Admitting: *Deleted

## 2018-08-28 DIAGNOSIS — Z5181 Encounter for therapeutic drug level monitoring: Secondary | ICD-10-CM

## 2018-08-28 DIAGNOSIS — G8929 Other chronic pain: Secondary | ICD-10-CM

## 2018-08-28 MED ORDER — HYDROCODONE-ACETAMINOPHEN 5-325 MG PO TABS: ORAL_TABLET | ORAL | 0 refills | Status: DC

## 2018-08-28 NOTE — Telephone Encounter (Signed)
Last Visit:05/20/2018 Next Visit:10/21/2018 UDS: 03/13/18  Narc Agreement: 03/13/18   Patient advised she is due to update UDS and narc agreement. She will update 08/29/18.  Okay to refill Hydrocodone?

## 2018-08-28 NOTE — Telephone Encounter (Signed)
Pt is needing refill on Hydrocodone 5-325mg . Please call pt to let her know when ready.

## 2018-09-04 ENCOUNTER — Other Ambulatory Visit: Payer: Self-pay | Admitting: *Deleted

## 2018-09-04 DIAGNOSIS — G8929 Other chronic pain: Secondary | ICD-10-CM

## 2018-09-04 DIAGNOSIS — Z5181 Encounter for therapeutic drug level monitoring: Secondary | ICD-10-CM | POA: Diagnosis not present

## 2018-09-04 DIAGNOSIS — Z79899 Other long term (current) drug therapy: Secondary | ICD-10-CM | POA: Diagnosis not present

## 2018-09-07 LAB — COMPLETE METABOLIC PANEL WITH GFR
AG Ratio: 1.7 (calc) (ref 1.0–2.5)
ALT: 26 U/L (ref 6–29)
AST: 23 U/L (ref 10–35)
Albumin: 4 g/dL (ref 3.6–5.1)
Alkaline phosphatase (APISO): 41 U/L (ref 37–153)
BUN: 14 mg/dL (ref 7–25)
CO2: 24 mmol/L (ref 20–32)
Calcium: 9.9 mg/dL (ref 8.6–10.4)
Chloride: 105 mmol/L (ref 98–110)
Creat: 0.92 mg/dL (ref 0.50–0.99)
GFR, Est African American: 74 mL/min/{1.73_m2} (ref >=60)
GFR, Est Non African American: 64 mL/min/{1.73_m2} (ref >=60)
Globulin: 2.3 g/dL (calc) (ref 1.9–3.7)
Glucose, Bld: 124 mg/dL — ABNORMAL HIGH (ref 65–99)
Potassium: 4.2 mmol/L (ref 3.5–5.3)
Sodium: 138 mmol/L (ref 135–146)
Total Bilirubin: 0.3 mg/dL (ref 0.2–1.2)
Total Protein: 6.3 g/dL (ref 6.1–8.1)

## 2018-09-07 LAB — QUANTIFERON-TB GOLD PLUS
Mitogen-NIL: >10 IU/mL
NIL: 0.02 IU/mL
QuantiFERON-TB Gold Plus: NEGATIVE
TB1-NIL: 0 IU/mL
TB2-NIL: 0 IU/mL

## 2018-09-07 LAB — PAIN MGMT, PROFILE 5 W/CONF, U
Amphetamines: NEGATIVE ng/mL
Barbiturates: NEGATIVE ng/mL
Benzodiazepines: NEGATIVE ng/mL
Cocaine Metabolite: NEGATIVE ng/mL
Codeine: NEGATIVE ng/mL
Creatinine: 32.8 mg/dL
Hydrocodone: 310 ng/mL
Hydromorphone: NEGATIVE ng/mL
Marijuana Metabolite: NEGATIVE ng/mL
Methadone Metabolite: NEGATIVE ng/mL
Morphine: NEGATIVE ng/mL
Norhydrocodone: 581 ng/mL
Opiates: POSITIVE ng/mL
Oxidant: NEGATIVE ug/mL
Oxycodone: NEGATIVE ng/mL
pH: 6.6 (ref 4.5–9.0)

## 2018-09-07 LAB — CBC WITH DIFFERENTIAL/PLATELET
Absolute Monocytes: 871 cells/uL (ref 200–950)
Basophils Absolute: 143 cells/uL (ref 0–200)
Basophils Relative: 1.1 %
Eosinophils Absolute: 1105 cells/uL — ABNORMAL HIGH (ref 15–500)
Eosinophils Relative: 8.5 %
HCT: 37.4 % (ref 35.0–45.0)
Hemoglobin: 12.4 g/dL (ref 11.7–15.5)
Lymphs Abs: 1846 cells/uL (ref 850–3900)
MCH: 32.2 pg (ref 27.0–33.0)
MCHC: 33.2 g/dL (ref 32.0–36.0)
MCV: 97.1 fL (ref 80.0–100.0)
MPV: 11.9 fL (ref 7.5–12.5)
Monocytes Relative: 6.7 %
Neutro Abs: 9035 cells/uL — ABNORMAL HIGH (ref 1500–7800)
Neutrophils Relative %: 69.5 %
Platelets: 228 10*3/uL (ref 140–400)
RBC: 3.85 10*6/uL (ref 3.80–5.10)
RDW: 14 % (ref 11.0–15.0)
Total Lymphocyte: 14.2 %
WBC: 13 10*3/uL — ABNORMAL HIGH (ref 3.8–10.8)

## 2018-09-08 NOTE — Progress Notes (Signed)
TB gold negative

## 2018-09-13 ENCOUNTER — Other Ambulatory Visit: Payer: Self-pay | Admitting: Rheumatology

## 2018-09-15 NOTE — Telephone Encounter (Signed)
Last Visit: 05/20/2018 Next Visit: 10/21/2018  Okay to refill per Dr. Estanislado Pandy.

## 2018-09-23 NOTE — Progress Notes (Deleted)
Office Visit Note  Patient: Tonya Hall             Date of Birth: 07-Feb-1951           MRN: [id number]             PCP: [name], MD Referring: [name], MD Visit Date: 09/25/2018 Occupation: @GUAROCC @  Subjective:  No chief complaint on file.   Cosentyx 150 mg every 14 days, Arava 20 mg daily, and prednisone 5 mg daily.  Most recent TB gold negative on 09/04/2018.  Most recent CBC/CMP within normal limits except for elevated WBC count but stable on 09/04/2018.  Due for CBC/CMP in September and will monitor every 3 months.  Standing orders are in place.  She has received Pneumovax 23 vaccine.  Recommend annual flu, Prevnar 13, and Shingrix vaccines as indicated for immunosuppressant therapy.  She is on IV Reclast per Dr. Chalmers Cater which was started in 2015 and last infusion December 2019.  History of Present Illness: Tonya Hall is a 68 y.o. female ***   Activities of Daily Living:  Patient reports morning stiffness for *** {minute/hour:19697}.   Patient {ACTIONS;DENIES/REPORTS:21021675::"Denies"} nocturnal pain.  Difficulty dressing/grooming: {ACTIONS;DENIES/REPORTS:21021675::"Denies"} Difficulty climbing stairs: {ACTIONS;DENIES/REPORTS:21021675::"Denies"} Difficulty getting out of chair: {ACTIONS;DENIES/REPORTS:21021675::"Denies"} Difficulty using hands for taps, buttons, cutlery, and/or writing: {ACTIONS;DENIES/REPORTS:21021675::"Denies"}  No Rheumatology ROS completed.   PMFS History:  Patient Active Problem List   Diagnosis Date Noted  . Closed nondisplaced odontoid fracture with type II morphology ([place]) 01/27/2018  . Odontoid fracture ([place]) 01/24/2018  . Chest pain 11/14/2017  . Transaminitis 06/22/2016  . Collagenous colitis 05/23/2016  . Psoriasis 05/21/2016  . High risk medication use 02/15/2016  . Age-related osteoporosis without current pathological fracture 02/15/2016  . Trochanteric bursitis, left hip 02/15/2016  . Sacroiliitis, not elsewhere classified  ([place]) 02/15/2016  . Chronic pain syndrome 02/15/2016  . Abnormal weight gain 06/25/2012  . Hypertension 07/03/2011  . Hypercholesteremia 07/03/2011  . Psoriatic arthritis ([place]) 07/03/2011  . Osteoarthritis of multiple joints 07/03/2011  . Esophageal motility disorder 02/14/2011  . Cough 02/14/2011  . GERD 05/05/2010    Past Medical History:  Diagnosis Date  . Asthma    Albuterol in haler prn  . Cataract    immature unsure which eye  . Chronic back pain   . Degenerative disk disease    psoriatic  . Diabetes mellitus without complication (HCC)    diet controlled  . Esophageal motility disorder    Non-specific, see modified barium study/speech path, BP  . GERD (gastroesophageal reflux disease)    takes Protonix bid  . History of blood transfusion    post c-section  . History of bronchitis   . HTN (hypertension)   . Hyperlipidemia    takes Pravastatin daily  . Lymphocytic colitis 05/26/2010   Responded to Entocort x 3 MOS  . Neuropathy   . Osteoporosis    gets Boniva every 3 months  . Peripheral edema    takes HCTZ daily  . PONV (postoperative nausea and vomiting)   . Psoriatic arthritis ([place])    Dr. Katherina Right  . Raynaud's disease   . Seasonal allergies   . SUI (stress urinary incontinence, female)   . Urinary urgency     Family History  Problem Relation Age of Onset  . Diabetes Mother   . Pancreatitis Mother   . Arthritis Mother        psoriatic   . Fibromyalgia Mother   . Rheumatic fever Father   .  Diabetes Other        grandparents  . Skin cancer Other        grandfather  . Hypertension Other        grandparent  . Congestive Heart Failure Other        grandfather  . Parkinson's disease Other        grandmother  . Colon cancer Maternal Uncle   . Fibromyalgia Sister   . Rheum arthritis Sister   . Diabetes Sister   . Fibromyalgia Sister   . Diabetes Sister   . Hypertension Son   . Colon polyps Neg Hx    Past Surgical History:  Procedure  Laterality Date  . BIOPSY  04/17/2016   Procedure: BIOPSY;  Surgeon: Danie Binder, MD;  [facility]: [facility];  Service: Endoscopy;;  random colon bx's  . CERVICAL SPINE SURGERY  09/09/2017  . CESAREAN SECTION  1974, 1978      . COLONOSCOPY  06/19/2008   JQB:HALPF internal hemorrhoids/mild sigmoin colon diverticulosis  . COLONOSCOPY  2012   Dr. Gala Romney: normal rectum, diverticula, lymphocytic colitis   . COLONOSCOPY WITH PROPOFOL N/A 04/17/2016   Procedure: COLONOSCOPY WITH PROPOFOL;  Surgeon: Danie Binder, MD;  Location: [facility];  Service: Endoscopy;  Laterality: N/A;  900  . DILATION AND CURETTAGE OF UTERUS  1977   spontaneous abortion  . ESOPHAGOGASTRODUODENOSCOPY  06/19/2008   XTK:WIOXBDZH gastritis  . LUMBAR DISC ARTHROPLASTY  7/98  . LUMBAR FUSION  6/03, 11/08, 11/14   L4-5 fusion, L2-3 fusion, L1-2  . ODONTOID SCREW INSERTION N/A 01/27/2018   Procedure: ODONTOID SCREW INSERTION;  Surgeon: Newman Pies, MD;  Location: [facility];  Service: Neurosurgery;  Laterality: N/A;  ODONTOID SCREW INSERTION  . TONSILLECTOMY AND ADENOIDECTOMY    . TUBAL LIGATION     Social History   Social History Narrative   ATTENDS [employer]. 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   Immunization History  Administered Date(s) Administered  . Influenza,inj,Quad PF,6+ Mos 01/21/2017  . Influenza-Unspecified 01/30/2017  . Pneumococcal Polysaccharide-23 02/19/2013  . Tdap 09/10/2016     Objective: Vital Signs: LMP 02/01/1999    Physical Exam   Musculoskeletal Exam: ***  CDAI Exam: CDAI Score: - Patient Global: -; Provider Global: - Swollen: -; Tender: - Joint Exam   No joint exam has been documented for this visit   There is currently no information documented on the homunculus. Go to the Rheumatology activity and complete the homunculus joint exam.  Investigation: No additional findings.  Imaging: No results found.  Recent Labs: Lab Results  Component Value Date   WBC  13.0 (H) 09/04/2018   HGB 12.4 09/04/2018   PLT 228 09/04/2018   NA 138 09/04/2018   K 4.2 09/04/2018   CL 105 09/04/2018   CO2 24 09/04/2018   GLUCOSE 124 (H) 09/04/2018   BUN 14 09/04/2018   CREATININE 0.92 09/04/2018   BILITOT 0.3 09/04/2018   ALKPHOS 53 02/28/2018   AST 23 09/04/2018   ALT 26 09/04/2018   PROT 6.3 09/04/2018   ALBUMIN 3.9 02/28/2018   CALCIUM 9.9 09/04/2018   GFRAA 74 09/04/2018   QFTBGOLD Negative 05/05/2015   QFTBGOLDPLUS NEGATIVE 09/04/2018    Speciality Comments: No specialty comments available.  Procedures:  No procedures performed Allergies: Adalimumab, Imuran [azathioprine sodium], Methotrexate, Plaquenil [hydroxychloroquine sulfate], Sulfonamide derivatives, Ceftin [cefuroxime axetil], and Morphine   Assessment / Plan:     Visit Diagnoses: No diagnosis found.   Orders: No orders of the  defined types were placed in this encounter.  No orders of the defined types were placed in this encounter.   Face-to-face time spent with patient was *** minutes. Greater than 50% of time was spent in counseling and coordination of care.  Follow-Up Instructions: No follow-ups on file.   Earnestine Mealing, CMA  Note - This record has been created using Editor, commissioning.  Chart creation errors have been sought, but may not always  have been located. Such creation errors do not reflect on  the standard of medical care.

## 2018-09-24 ENCOUNTER — Other Ambulatory Visit (HOSPITAL_COMMUNITY): Payer: Self-pay | Admitting: Obstetrics & Gynecology

## 2018-09-24 DIAGNOSIS — Z1231 Encounter for screening mammogram for malignant neoplasm of breast: Secondary | ICD-10-CM

## 2018-09-25 ENCOUNTER — Ambulatory Visit: Payer: PPO | Admitting: Rheumatology

## 2018-09-29 ENCOUNTER — Other Ambulatory Visit: Payer: Self-pay

## 2018-09-29 ENCOUNTER — Ambulatory Visit (HOSPITAL_COMMUNITY)
Admission: RE | Admit: 2018-09-29 | Discharge: 2018-09-29 | Disposition: A | Discharge status: Home / Self Care | Payer: PPO | Source: Ambulatory Visit | Attending: Obstetrics & Gynecology | Admitting: Obstetrics & Gynecology

## 2018-09-29 DIAGNOSIS — Z1231 Encounter for screening mammogram for malignant neoplasm of breast: Secondary | ICD-10-CM | POA: Diagnosis not present

## 2018-10-02 DIAGNOSIS — L723 Sebaceous cyst: Secondary | ICD-10-CM | POA: Diagnosis not present

## 2018-10-08 NOTE — Progress Notes (Signed)
Office Visit Note  Patient: Tonya Hall             Date of Birth: September 06, 1950           MRN: [id number]             PCP: [name], MD Referring: [name], MD Visit Date: 10/21/2018 Occupation: @GUAROCC @  Subjective:  Pain in multiple joints.     History of Present Illness: JUDENE LOGUE is a 68 y.o. female with history of psoriatic arthritis, osteoarthritis and psoriasis.  She states she has been having increased discomfort in her hands, left SI joint and right shoulder.  She states the left SI joint pain has been going on for almost 2 months now.  She has significant stiffness in the morning which lasts almost all day.  She had shingles infection in March and she was off Cosentyx for almost 2 months and missed dosage of Areva during that time.  After that she developed an infection on her abdomen in June and was treated with antibiotics.  She skipped her Cosentyx and Arava for almost a month.  The lesion has just to started healing.  She started Areva a week ago and had most recent dose of Cosentyx about a week ago.  Activities of Daily Living:  Patient reports morning stiffness for 24 hours.   Patient Denies nocturnal pain.  Difficulty dressing/grooming: Denies Difficulty climbing stairs: Reports Difficulty getting out of chair: Reports Difficulty using hands for taps, buttons, cutlery, and/or writing: Reports  Review of Systems  Constitutional: Positive for fatigue.  HENT: Positive for mouth dryness. Negative for mouth sores and nose dryness.   Eyes: Positive for dryness. Negative for pain and itching.  Respiratory: Negative for shortness of breath, wheezing and difficulty breathing.   Cardiovascular: Negative for chest pain, palpitations and swelling in legs/feet.  Gastrointestinal: Negative for abdominal pain, constipation and diarrhea.  Endocrine: Negative for increased urination.  Genitourinary: Negative for painful urination and pelvic pain.  Musculoskeletal:  Positive for arthralgias, joint pain, joint swelling and morning stiffness.  Skin: Negative for rash and redness.  Allergic/Immunologic: Negative for susceptible to infections.  Neurological: Positive for headaches. Negative for dizziness, light-headedness, memory loss and weakness.  Hematological: Negative for bruising/bleeding tendency.  Psychiatric/Behavioral: Negative for confusion. The patient is not nervous/anxious.     PMFS History:  Patient Active Problem List   Diagnosis Date Noted  . Closed nondisplaced odontoid fracture with type II morphology ([place]) 01/27/2018  . Odontoid fracture ([place]) 01/24/2018  . Chest pain 11/14/2017  . Transaminitis 06/22/2016  . Collagenous colitis 05/23/2016  . Psoriasis 05/21/2016  . High risk medication use 02/15/2016  . Age-related osteoporosis without current pathological fracture 02/15/2016  . Trochanteric bursitis, left hip 02/15/2016  . Sacroiliitis, not elsewhere classified ([place]) 02/15/2016  . Chronic pain syndrome 02/15/2016  . Abnormal weight gain 06/25/2012  . Hypertension 07/03/2011  . Hypercholesteremia 07/03/2011  . Psoriatic arthritis ([place]) 07/03/2011  . Osteoarthritis of multiple joints 07/03/2011  . Esophageal motility disorder 02/14/2011  . Cough 02/14/2011  . GERD 05/05/2010    Past Medical History:  Diagnosis Date  . Asthma    Albuterol in haler prn  . Cataract    immature unsure which eye  . Chronic back pain   . Degenerative disk disease    psoriatic  . Diabetes mellitus without complication (HCC)    diet controlled  . Esophageal motility disorder    Non-specific, see modified barium study/speech path, BP  .  GERD (gastroesophageal reflux disease)    takes Protonix bid  . History of blood transfusion    post c-section  . History of bronchitis   . HTN (hypertension)   . Hyperlipidemia    takes Pravastatin daily  . Lymphocytic colitis 05/26/2010   Responded to Entocort x 3 MOS  . Neuropathy   . Osteoporosis     gets Boniva every 3 months  . Peripheral edema    takes HCTZ daily  . PONV (postoperative nausea and vomiting)   . Psoriatic arthritis ([place])    Dr. Katherina Right  . Raynaud's disease   . Seasonal allergies   . SUI (stress urinary incontinence, female)   . Urinary urgency     Family History  Problem Relation Age of Onset  . Diabetes Mother   . Pancreatitis Mother   . Arthritis Mother        psoriatic   . Fibromyalgia Mother   . Rheumatic fever Father   . Diabetes Other        grandparents  . Skin cancer Other        grandfather  . Hypertension Other        grandparent  . Congestive Heart Failure Other        grandfather  . Parkinson's disease Other        grandmother  . Colon cancer Maternal Uncle   . Fibromyalgia Sister   . Rheum arthritis Sister   . Diabetes Sister   . Fibromyalgia Sister   . Diabetes Sister   . Hypertension Son   . Colon polyps Neg Hx    Past Surgical History:  Procedure Laterality Date  . BIOPSY  04/17/2016   Procedure: BIOPSY;  Surgeon: Danie Binder, MD;  [facility];  Service: Endoscopy;;  random colon bx's  . CERVICAL SPINE SURGERY  09/09/2017  . CESAREAN SECTION  1974, 1978      . COLONOSCOPY  06/19/2008   IRJ:JOACZ internal hemorrhoids/mild sigmoin colon diverticulosis  . COLONOSCOPY  2012   Dr. Gala Romney: normal rectum, diverticula, lymphocytic colitis   . COLONOSCOPY WITH PROPOFOL N/A 04/17/2016   Procedure: COLONOSCOPY WITH PROPOFOL;  Surgeon: Danie Binder, MD;  [facility];  Service: Endoscopy;  Laterality: N/A;  900  . DILATION AND CURETTAGE OF UTERUS  1977   spontaneous abortion  . ESOPHAGOGASTRODUODENOSCOPY  06/19/2008   [id number] gastritis  . LUMBAR DISC ARTHROPLASTY  7/98  . LUMBAR FUSION  6/03, 11/08, 11/14   L4-5 fusion, L2-3 fusion, L1-2  . ODONTOID SCREW INSERTION N/A 01/27/2018   Procedure: ODONTOID SCREW INSERTION;  Surgeon: Newman Pies, MD;  Location: [facility];  Service: Neurosurgery;   Laterality: N/A;  ODONTOID SCREW INSERTION  . TONSILLECTOMY AND ADENOIDECTOMY    . TUBAL LIGATION     Social History   Social History Narrative   ATTENDS [employer]. 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   Immunization History  Administered Date(s) Administered  . Influenza,inj,Quad PF,6+ Mos 01/21/2017  . Influenza-Unspecified 01/30/2017  . Pneumococcal Polysaccharide-23 02/19/2013  . Tdap 09/10/2016     Objective: Vital Signs: BP 138/67 (BP Location: Left Arm, Patient Position: Sitting, Cuff Size: Normal)   Pulse 86   Resp 13   Ht 4\' 11"  (1.499 m)   Wt 140 lb (63.5 kg)   LMP 02/01/1999   BMI 28.28 kg/m    Physical Exam Vitals signs and nursing note reviewed.  Constitutional:      Appearance: She is well-developed.  HENT:  Head: Normocephalic and atraumatic.  Eyes:     Conjunctiva/sclera: Conjunctivae normal.  Neck:     Musculoskeletal: Normal range of motion.  Cardiovascular:     Rate and Rhythm: Normal rate and regular rhythm.     Heart sounds: Normal heart sounds.  Pulmonary:     Effort: Pulmonary effort is normal.     Breath sounds: Normal breath sounds.  Abdominal:     General: Bowel sounds are normal.     Palpations: Abdomen is soft.  Lymphadenopathy:     Cervical: No cervical adenopathy.  Skin:    General: Skin is warm and dry.     Capillary Refill: Capillary refill takes less than 2 seconds.  Neurological:     Mental Status: She is alert and oriented to person, place, and time.  Psychiatric:        Behavior: Behavior normal.      Musculoskeletal Exam: Patient has limited range of motion of her cervical spine.  Shoulder joints elbow joints wrist joints with good range of motion.  She has synovitis over bilateral PIP joints.  She had tenderness on palpation of her left SI joint.  Knee joints and ankle joints with good range of motion without any swelling.  CDAI Exam: CDAI Score: 14.4  Patient Global: 7 mm; Provider Global: 7 mm Swollen: 6 ;  Tender: 8  Joint Exam      Right  Left  Glenohumeral   Tender     PIP 2  Swollen Tender  Swollen Tender  PIP 3  Swollen Tender  Swollen Tender  PIP 4  Swollen Tender  Swollen Tender  Sacroiliac      Tender     Investigation: No additional findings.  Imaging: Mm 3d Screen Breast Bilateral  Result Date: 09/29/2018 CLINICAL DATA:  Screening. EXAM: DIGITAL SCREENING BILATERAL MAMMOGRAM WITH TOMO AND CAD COMPARISON:  Previous exam(s). ACR Breast Density Category c: The breast tissue is heterogeneously dense, which may obscure small masses. FINDINGS: There are no findings suspicious for malignancy. Images were processed with CAD. IMPRESSION: No mammographic evidence of malignancy. A result letter of this screening mammogram will be mailed directly to the patient. RECOMMENDATION: Screening mammogram in one year. (Code:SM-B-01Y) BI-RADS CATEGORY  1: Negative. Electronically Signed   By: Curlene Dolphin M.D.   On: 09/29/2018 15:41    Recent Labs: Lab Results  Component Value Date   WBC 13.0 (H) 09/04/2018   HGB 12.4 09/04/2018   PLT 228 09/04/2018   NA 138 09/04/2018   K 4.2 09/04/2018   CL 105 09/04/2018   CO2 24 09/04/2018   GLUCOSE 124 (H) 09/04/2018   BUN 14 09/04/2018   CREATININE 0.92 09/04/2018   BILITOT 0.3 09/04/2018   ALKPHOS 53 02/28/2018   AST 23 09/04/2018   ALT 26 09/04/2018   PROT 6.3 09/04/2018   ALBUMIN 3.9 02/28/2018   CALCIUM 9.9 09/04/2018   GFRAA 74 09/04/2018   QFTBGOLD Negative 05/05/2015   QFTBGOLDPLUS NEGATIVE 09/04/2018    Speciality Comments: No specialty comments available.  Procedures:  Sacroiliac Joint Inj on 10/21/2018 11:31 AM Indications: pain Details: 27 G 1.5 in needle, posterior approach Medications: 1 mL lidocaine 1 %; 40 mg triamcinolone acetonide 40 MG/ML Aspirate: 0 mL Outcome: tolerated well, no immediate complications Procedure, treatment alternatives, risks and benefits explained, specific risks discussed. Consent was given by the  patient. Immediately prior to procedure a time out was called to verify the correct patient, procedure, equipment, support staff and site/side marked as  required. Patient was prepped and draped in the usual sterile fashion.     Allergies: Adalimumab, Imuran [azathioprine sodium], Methotrexate, Plaquenil [hydroxychloroquine sulfate], Sulfonamide derivatives, Ceftin [cefuroxime axetil], and Morphine   Assessment / Plan:     Visit Diagnoses: Psoriatic arthritis ([place]) -patient is having a flare as she has missed several doses of Cosentyx and Areva due to the shingles vaccine and then a skin infection.  She currently has synovitis on examination today.  She has resumed Cosentyx and Areva now.  Psoriasis -she does not have any active psoriasis lesions.  High risk medication use - Cosentyx 150 mg every 14 days, Arava 20 mg 1 tablet daily, and prednisone 5 mg 1 tablet daily.  Last TB gold negative on 09/04/2018 and will monitor yearly.  Most recent CBC/CMP within normal limits except for elevated WBC count but stable on 09/04/2018.  Due for CBC/CMP in September and will monitor every 3 months.  Standing orders are in place. She received Pneumovax 23 vaccine.  Recommend annual influenza, Prevnar 13, and Shingrix as indicated for immunosuppressant therapy.   Chronic left SI joint pain -she is having severe left SI joint discomfort.  Per her request left SI joint was injected with cortisone as described above.  She tolerated the procedure well.  Primary osteoarthritis involving multiple joints -she has chronic discomfort from underlying osteoarthritis.  Raynaud's syndrome without gangrene -currently not active.  DDD (degenerative disc disease), cervical -she has decreased range of motion.  DDD (degenerative disc disease), thoracic -she has thoracic kyphosis and decreased range of motion.  Age-related osteoporosis without current pathological fracture - She is on IV Reclast per Dr. Chalmers Cater (started in 2015).    Chronic right shoulder pain-x-ray was unremarkable.  She continues to have some discomfort.  I have advised her to try exercises.  If her pain is persistent we can inject at the follow-up visit.  Chronic pain syndrome - hydrocodone. UDS: 6/4/2020Narc agreement: 09/04/2018   History of type 2 diabetes mellitus -she was advised to monitor blood sugar closely.  History of gastroesophageal reflux (GERD)   Essential hypertension -blood pressure is normal.  Have advised her to monitor blood pressure closely.  Collagenous colitis -followed by gastroenterologist   Esophageal motilitisorder   Orders: Orders Placed This Encounter  Procedures  . Sacroiliac Joint Inj   No orders of the defined types were placed in this encounter.     Follow-Up Instructions: Return in about 3 months (around 01/21/2019) for Psoriatic arthritis.   Bo Merino, MD  Note - This record has been created using Editor, commissioning.  Chart creation errors have been sought, but may not always  have been located. Such creation errors do not reflect on  the standard of medical care.

## 2018-10-09 ENCOUNTER — Telehealth: Payer: Self-pay | Admitting: Rheumatology

## 2018-10-09 MED ORDER — HYDROCODONE-ACETAMINOPHEN 5-325 MG PO TABS: ORAL_TABLET | ORAL | 0 refills | Status: DC

## 2018-10-09 NOTE — Telephone Encounter (Signed)
Patient left a voicemail requesting prescription refill of Hydrocodone to be sent to [facility].

## 2018-10-09 NOTE — Telephone Encounter (Signed)
Last Visit: 05/20/2018 Next Visit: 10/21/2018 UDS: 09/04/18 Narc Agreement: 09/04/18   Okay to refill Hydrocodone?

## 2018-10-15 ENCOUNTER — Other Ambulatory Visit: Payer: Self-pay | Admitting: Rheumatology

## 2018-10-15 NOTE — Telephone Encounter (Signed)
Last Visit:05/20/2018 Next Visit:10/21/2018  Okay to refill per Dr. Estanislado Pandy

## 2018-10-16 ENCOUNTER — Other Ambulatory Visit: Payer: Self-pay | Admitting: *Deleted

## 2018-10-16 MED ORDER — COSENTYX SENSOREADY PEN 150 MG/ML ~~LOC~~ SOAJ: 150.0000 mg | SUBCUTANEOUS | 0 refills | Status: DC

## 2018-10-16 NOTE — Telephone Encounter (Signed)
Last Visit: 05/20/2018 Next Visit: 10/21/2018 Labs: 09/04/18 glucose is elevated-124. Rest of CMP WNL. WBC count is elevated but stable. TB Gold: 09/04/18 Neg   Okay to refill per Dr. Estanislado Pandy

## 2018-10-21 ENCOUNTER — Ambulatory Visit: Payer: PPO | Admitting: Rheumatology

## 2018-10-21 ENCOUNTER — Other Ambulatory Visit: Payer: Self-pay

## 2018-10-21 ENCOUNTER — Encounter: Payer: Self-pay | Admitting: Physician Assistant

## 2018-10-21 VITALS — BP 138/67 | HR 86 | Resp 13 | Ht 59.0 in | Wt 140.0 lb

## 2018-10-21 DIAGNOSIS — M5134 Other intervertebral disc degeneration, thoracic region: Secondary | ICD-10-CM

## 2018-10-21 DIAGNOSIS — Z8719 Personal history of other diseases of the digestive system: Secondary | ICD-10-CM

## 2018-10-21 DIAGNOSIS — M810 Age-related osteoporosis without current pathological fracture: Secondary | ICD-10-CM | POA: Diagnosis not present

## 2018-10-21 DIAGNOSIS — M533 Sacrococcygeal disorders, not elsewhere classified: Secondary | ICD-10-CM | POA: Diagnosis not present

## 2018-10-21 DIAGNOSIS — M7062 Trochanteric bursitis, left hip: Secondary | ICD-10-CM

## 2018-10-21 DIAGNOSIS — K52831 Collagenous colitis: Secondary | ICD-10-CM

## 2018-10-21 DIAGNOSIS — Z79899 Other long term (current) drug therapy: Secondary | ICD-10-CM | POA: Diagnosis not present

## 2018-10-21 DIAGNOSIS — I10 Essential (primary) hypertension: Secondary | ICD-10-CM

## 2018-10-21 DIAGNOSIS — G894 Chronic pain syndrome: Secondary | ICD-10-CM

## 2018-10-21 DIAGNOSIS — L4050 Arthropathic psoriasis, unspecified: Secondary | ICD-10-CM | POA: Diagnosis not present

## 2018-10-21 DIAGNOSIS — M150 Primary generalized (osteo)arthritis: Secondary | ICD-10-CM | POA: Diagnosis not present

## 2018-10-21 DIAGNOSIS — G8929 Other chronic pain: Secondary | ICD-10-CM

## 2018-10-21 DIAGNOSIS — L409 Psoriasis, unspecified: Secondary | ICD-10-CM

## 2018-10-21 DIAGNOSIS — M5030 Other cervical disc degeneration, unspecified cervical region: Secondary | ICD-10-CM | POA: Diagnosis not present

## 2018-10-21 DIAGNOSIS — I7300 Raynaud's syndrome without gangrene: Secondary | ICD-10-CM | POA: Diagnosis not present

## 2018-10-21 DIAGNOSIS — Z8639 Personal history of other endocrine, nutritional and metabolic disease: Secondary | ICD-10-CM | POA: Diagnosis not present

## 2018-10-21 DIAGNOSIS — M8949 Other hypertrophic osteoarthropathy, multiple sites: Secondary | ICD-10-CM

## 2018-10-21 DIAGNOSIS — M25511 Pain in right shoulder: Secondary | ICD-10-CM

## 2018-10-21 DIAGNOSIS — M159 Polyosteoarthritis, unspecified: Secondary | ICD-10-CM

## 2018-10-21 DIAGNOSIS — K224 Dyskinesia of esophagus: Secondary | ICD-10-CM

## 2018-10-21 MED ORDER — LIDOCAINE HCL 1 % IJ SOLN
1.0000 mL | INTRAMUSCULAR | Status: AC | PRN
  Administered 2018-10-21: 1 mL

## 2018-10-21 MED ORDER — TRIAMCINOLONE ACETONIDE 40 MG/ML IJ SUSP
40.0000 mg | INTRAMUSCULAR | Status: AC | PRN
  Administered 2018-10-21: 40 mg via INTRA_ARTICULAR

## 2018-10-21 NOTE — Patient Instructions (Signed)
Standing Labs We placed an order today for your standing lab work.    Please come back and get your standing labs in September and every 3 months  We have open lab daily Monday through Thursday from 8:30-12:30 PM and 1:30-4:30 PM and Friday from 8:30-12:30 PM and 1:30 -4:00 PM at the office of Dr. Shajuana Mclucas.   You may experience shorter wait times on Monday and Friday afternoons. The office is located at [address], [address], [place], [place] [address] No appointment is necessary.   Labs are drawn by [facility].  You may receive a bill from [facility] for your lab work.  If you wish to have your labs drawn at another location, please call the office 24 hours in advance to send orders.  If you have any questions regarding directions or hours of operation,  please call [phone].   Just as a reminder please drink plenty of water prior to coming for your lab work. Thanks!  

## 2018-10-29 ENCOUNTER — Other Ambulatory Visit: Payer: Self-pay | Admitting: Gastroenterology

## 2018-10-29 DIAGNOSIS — R197 Diarrhea, unspecified: Secondary | ICD-10-CM

## 2018-10-29 DIAGNOSIS — K219 Gastro-esophageal reflux disease without esophagitis: Secondary | ICD-10-CM

## 2018-10-29 NOTE — Telephone Encounter (Signed)
PT says she has been on bid given from Dr. Oneida Alar for sometime, and she takes bid when she remembers.

## 2018-10-29 NOTE — Telephone Encounter (Signed)
Please verify dosage for patient. Refill request is for BID but OV note from [facility] says daily.

## 2018-11-14 ENCOUNTER — Other Ambulatory Visit: Payer: Self-pay | Admitting: Rheumatology

## 2018-11-14 NOTE — Telephone Encounter (Signed)
Last Visit: 10/21/18 Next Visit: 01/22/19   Okay to refill per Dr. Estanislado Pandy

## 2018-11-19 ENCOUNTER — Encounter: Payer: Self-pay | Admitting: Gastroenterology

## 2018-11-19 ENCOUNTER — Other Ambulatory Visit: Payer: Self-pay

## 2018-11-19 ENCOUNTER — Ambulatory Visit: Payer: PPO | Admitting: Gastroenterology

## 2018-11-19 DIAGNOSIS — K219 Gastro-esophageal reflux disease without esophagitis: Secondary | ICD-10-CM

## 2018-11-19 DIAGNOSIS — R1319 Other dysphagia: Secondary | ICD-10-CM

## 2018-11-19 DIAGNOSIS — R1310 Dysphagia, unspecified: Secondary | ICD-10-CM | POA: Diagnosis not present

## 2018-11-19 DIAGNOSIS — K52831 Collagenous colitis: Secondary | ICD-10-CM

## 2018-11-19 NOTE — Patient Instructions (Signed)
DRINK WATER TO KEEP YOUR URINE LIGHT YELLOW.  FOLLOW A HIGH FIBER DIET. Your PLATE SHOULD BE 38% FRUITS OR VEGGIES. AVOID ITEMS THAT CAUSE BLOATING & GAS.  FOLLOW UP IN 1 YEAR.   Please CALL or SEND me A MY CHART MESSAGE IF YOU HAVE QUESTIONS OR CONCERNS.

## 2018-11-19 NOTE — Assessment & Plan Note (Signed)
SYMPTOMS FAIRLY WELL CONTROLLED.  CONTINUE TO MONITOR SYMPTOMS. CONTINUE OMEPRAZOLE.  TAKE 30 MINUTES PRIOR TO YOUR MEALS TWICE DAILY. FOLLOW UP IN 1 YEAR.

## 2018-11-19 NOTE — Progress Notes (Signed)
Subjective:    Patient ID: Tonya Hall, female    DOB: 08-15-1950, 68 y.o.   MRN: [id number]  Tonya Noble, MD   HPI BMs: AT [facility]. LAST DIARRHEA: CAN'T REMEMBER. OCCASIONAL SOFT STOOL BUT NOT WATERY.THE OLDER SHE GETS SHE CANNOT SWALLOW POTASSIUM PILL AT NIGHT AND IN AM IT MAY STICK. TRIED LIQUID KCL AND IT WAS AWFUL. HAD TO USE IT WHEN SHE BROKE HER NECK. HEARTBURN: 1X/Q6 MO.  PT DENIES FEVER, CHILLS, HEMATOCHEZIA, HEMATEMESIS, nausea, vomiting, melena, diarrhea, CHEST PAIN, SHORTNESS OF BREATH, CHANGE IN BOWEL IN HABITS, constipation, abdominal pain, OR heartburn or indigestion.  Past Medical History:  Diagnosis Date  . Asthma    Albuterol in haler prn  . Cataract    immature unsure which eye  . Chronic back pain   . Degenerative disk disease    psoriatic  . Diabetes mellitus without complication (HCC)    diet controlled  . Esophageal motility disorder    Non-specific, see modified barium study/speech path, BP  . GERD (gastroesophageal reflux disease)    takes Protonix bid  . History of blood transfusion    post c-section  . History of bronchitis   . HTN (hypertension)   . Hyperlipidemia    takes Pravastatin daily  . Lymphocytic colitis 05/26/2010   Responded to Entocort x 3 MOS  . Neuropathy   . Osteoporosis    gets Boniva every 3 months  . Peripheral edema    takes HCTZ daily  . PONV (postoperative nausea and vomiting)   . Psoriatic arthritis ([place])    Dr. Katherina Right  . Raynaud's disease   . Seasonal allergies   . SUI (stress urinary incontinence, female)   . Urinary urgency    Past Surgical History:  Procedure Laterality Date  . BIOPSY  04/17/2016   Procedure: BIOPSY;  Surgeon: Danie Binder, MD;  [facility];  Service: Endoscopy;;  random colon bx's  . CERVICAL SPINE SURGERY  09/09/2017  . CESAREAN SECTION  1974, 1978      . COLONOSCOPY  06/19/2008   [id number] internal hemorrhoids/mild sigmoin colon diverticulosis  .  COLONOSCOPY  2012   Dr. Gala Romney: normal rectum, diverticula, lymphocytic colitis   . COLONOSCOPY WITH PROPOFOL N/A 04/17/2016   Procedure: COLONOSCOPY WITH PROPOFOL;  Surgeon: Danie Binder, MD;  [facility];  Service: Endoscopy;  Laterality: N/A;  900  . DILATION AND CURETTAGE OF UTERUS  1977   spontaneous abortion  . ESOPHAGOGASTRODUODENOSCOPY  06/19/2008   [id number] gastritis  . LUMBAR DISC ARTHROPLASTY  7/98  . LUMBAR FUSION  6/03, 11/08, 11/14   L4-5 fusion, L2-3 fusion, L1-2  . ODONTOID SCREW INSERTION N/A 01/27/2018   Procedure: ODONTOID SCREW INSERTION;  Surgeon: Newman Pies, MD;  Location: [facility];  Service: Neurosurgery;  Laterality: N/A;  ODONTOID SCREW INSERTION  . TONSILLECTOMY AND ADENOIDECTOMY    . TUBAL LIGATION     Allergies  Allergen Reactions  . Adalimumab Rash and Other (See Comments)    FEVER, Fast pulse  . Imuran [Azathioprine Sodium] Rash    Denies Airway involvement  . Methotrexate Other (See Comments)    Liver Enzyme elevation - after 1 month of use  . Plaquenil [Hydroxychloroquine Sulfate] Rash    Denies airway involvement.   . Sulfonamide Derivatives Rash    Occurred with Sulfasalazine. Rash only.   . Ceftin [Cefuroxime Axetil] Nausea Only    Upset stomach  . Morphine Nausea And Vomiting  IV morphine caused N and V  After surgery.   Has tolerated [place] in the past    Current Outpatient Medications  Medication Sig    . albuterol (PROVENTIL HFA;VENTOLIN HFA) 108 (90 BASE) MCG/ACT inhaler Inhale 2 puffs into the lungs every 6 (six) hours as needed for wheezing or shortness of breath.     Marland Kitchen aspirin 81 MG tablet Take 81 mg by mouth daily.      . Calcium Carb-Cholecalciferol (CALCIUM 600 + D PO) Take 1 tablet by mouth 2 (two) times daily.    . cetirizine (ZYRTEC) 10 MG tablet Take 10 mg by mouth daily as needed for allergies.    Marland Kitchen diclofenac sodium (VOLTAREN) 1 % GEL Apply 2 g topically 3 (three) times daily as needed (pain).     . fish  oil-omega-3 fatty acids 1000 MG capsule Take 2 g by mouth daily.     Marland Kitchen gabapentin (NEURONTIN) 300 MG capsule Take 300 mg by mouth 4 (four) times daily.     . Ginger, Zingiber officinalis, (GINGER ROOT) 500 MG CAPS Take 1 capsule by mouth daily.     . Glucosamine Sulfate 1000 MG CAPS Take 2 capsules (2,000 mg total) by mouth daily.    Marland Kitchen guaiFENesin-dextromethorphan (ROBITUSSIN DM) 100-10 MG/5ML syrup Take 10 mLs by mouth every 6 (six) hours as needed for cough.    Marland Kitchen HUMULIN N KWIKPEN 100 UNIT/ML Kiwkpen Inject 10 Units into the skin at bedtime.    Marland Kitchen HYDROcodone-acetaminophen (NORCO/VICODIN) 5-325 MG tablet Take 1 tablet by mouth 2-3 times daily as needed for pain    . hydrocortisone sodium succinate (SOLU-CORTEF) 100 MG SOLR injection Inject 100 mg into the muscle See admin instructions. Take as needed when unable to swallow prednisone tablets     . leflunomide (ARAVA) 20 MG tablet TAKE 1 TABLET BY MOUTH ONCE DAILY.    Marland Kitchen lidocaine (XYLOCAINE) 2 % solution 2 TSP  PO qac/hs PRN FOR upper ABDOMINAL OR CHEST PAIN. MAY REPEAT DOSE EVERY 4 HOURS. NO MORE THAN 8 DOSES A DAY.    Marland Kitchen losartan (COZAAR) 50 MG tablet Take 100 mg by mouth daily.     . metoprolol succinate (TOPROL-XL) 50 MG 24 hr tablet Take 1 tablet (50 mg total) by mouth daily. Take with or immediately following a meal.    . Misc Natural Products (TART CHERRY ADVANCED PO) Take 1 tablet by mouth daily. daily     . Multiple Vitamin (MULTIVITAMIN) tablet Take 1 tablet by mouth daily.      . nortriptyline (PAMELOR) 25 MG capsule TAKE (1) CAPSULE BY MOUTH AT BEDTIME. (Patient taking differently: Take 25 mg by mouth at bedtime. )    . omeprazole (PRILOSEC) 20 MG capsule Take one capsule once to twice daily before meals.    Glory Rosebush VERIO test strip     . potassium chloride SA (K-DUR,KLOR-CON) 20 MEQ tablet Take 20 mEq by mouth daily.     . predniSONE (DELTASONE) 5 MG tablet TAKE ONE TABLET EVERY MORNING.    . RESTASIS MULTIDOSE 0.05 % ophthalmic  emulsion Place 1 drop into both eyes 2 (two) times daily.     . Secukinumab (COSENTYX SENSOREADY PEN) 150 MG/ML SOAJ Inject 150 mg into the skin every 14 (fourteen) days. Faxed prescription to [facility]    . spironolactone (ALDACTONE) 25 MG tablet Take 25 mg by mouth daily.     . SURE COMFORT INSULIN SYRINGE 31G X 5/16" 0.3 ML MISC     .  SURE COMFORT PEN NEEDLES 31G X 5 MM MISC     . topiramate (TOPAMAX) 200 MG tablet TAKE (1) TABLET BY MOUTH AT BEDTIME.    . Turmeric 500 MG CAPS Take 1 capsule by mouth daily.     . Zoledronic Acid (RECLAST IV) Inject into the vein.     . Evolocumab (REPATHA [place]) Inject into the skin.     Review of Systems PER HPI OTHERWISE ALL SYSTEMS ARE NEGATIVE.    Objective:   Physical Exam Vitals signs reviewed.  Constitutional:      General: She is not in acute distress.    Appearance: She is well-developed.  HENT:     Head: Normocephalic and atraumatic.     Mouth/Throat:     Comments: MASK IN PLACE Eyes:     General: No scleral icterus.    Pupils: Pupils are equal, round, and reactive to light.  Neck:     Musculoskeletal: Neck rigidity present.  Cardiovascular:     Rate and Rhythm: Normal rate and regular rhythm.     Heart sounds: Normal heart sounds.  Pulmonary:     Effort: Pulmonary effort is normal. No respiratory distress.     Breath sounds: Normal breath sounds.  Abdominal:     General: Bowel sounds are normal. There is no distension.     Palpations: Abdomen is soft.     Tenderness: There is no abdominal tenderness.  Musculoskeletal:        General: Deformity (in hands bilaterally) present.     Right lower leg: No edema.     Left lower leg: No edema.  Lymphadenopathy:     Cervical: No cervical adenopathy.  Neurological:     General: No focal deficit present.     Mental Status: She is alert and oriented to person, place, and time.  Psychiatric:        Mood and Affect: Mood normal.        Thought Content: Thought content normal.        Assessment & Plan:

## 2018-11-19 NOTE — Progress Notes (Signed)
CC'ED TO PCP 

## 2018-11-19 NOTE — Assessment & Plan Note (Signed)
SYMPTOMS FAIRLY WELL CONTROLLED EXCEPT LARGE POTASSIUM PILL.  CONTINUE TO MONITOR SYMPTOMS. FOLLOW UP IN 1 YEAR.

## 2018-11-19 NOTE — Progress Notes (Signed)
ON RECALL  °

## 2018-11-19 NOTE — Assessment & Plan Note (Addendum)
SYMPTOMS CONTROLLED/RESOLVED.  CONTINUE TO MONITOR SYMPTOMS. FOLLOW UP IN 1 YEAR.  Please CALL or SEND me A MY CHART MESSAGE IF YOU HAVE QUESTIONS OR CONCERNS.

## 2018-11-25 ENCOUNTER — Telehealth: Payer: Self-pay | Admitting: Rheumatology

## 2018-11-25 MED ORDER — HYDROCODONE-ACETAMINOPHEN 5-325 MG PO TABS: ORAL_TABLET | ORAL | 0 refills | Status: DC

## 2018-11-25 NOTE — Telephone Encounter (Signed)
Patient request refill on Hydrocodone sent to [facility]. °

## 2018-11-25 NOTE — Telephone Encounter (Signed)
Last Visit: 10/21/18 Next Visit: 01/22/19  UDS: 09/04/18 Narc Agreement: 09/04/18  Okay to refill Hydrocodone?

## 2018-11-27 ENCOUNTER — Other Ambulatory Visit: Payer: Self-pay | Admitting: Rheumatology

## 2018-12-12 DIAGNOSIS — M4312 Spondylolisthesis, cervical region: Secondary | ICD-10-CM | POA: Diagnosis not present

## 2018-12-12 DIAGNOSIS — I10 Essential (primary) hypertension: Secondary | ICD-10-CM | POA: Diagnosis not present

## 2018-12-12 DIAGNOSIS — M542 Cervicalgia: Secondary | ICD-10-CM | POA: Diagnosis not present

## 2018-12-12 DIAGNOSIS — Z6828 Body mass index (BMI) 28.0-28.9, adult: Secondary | ICD-10-CM | POA: Diagnosis not present

## 2018-12-16 ENCOUNTER — Other Ambulatory Visit: Payer: Self-pay | Admitting: Rheumatology

## 2018-12-16 NOTE — Telephone Encounter (Signed)
Last Visit: 10/21/18 Next Visit: 01/22/19   Okay to refill per Dr. Deveshwar  

## 2018-12-18 ENCOUNTER — Other Ambulatory Visit: Payer: Self-pay

## 2018-12-18 ENCOUNTER — Encounter (HOSPITAL_COMMUNITY): Payer: Self-pay | Admitting: Physical Therapy

## 2018-12-18 ENCOUNTER — Ambulatory Visit (HOSPITAL_COMMUNITY): Payer: PPO | Attending: Neurosurgery | Admitting: Physical Therapy

## 2018-12-18 DIAGNOSIS — M62838 Other muscle spasm: Secondary | ICD-10-CM | POA: Insufficient documentation

## 2018-12-18 DIAGNOSIS — M5412 Radiculopathy, cervical region: Secondary | ICD-10-CM | POA: Diagnosis not present

## 2018-12-18 NOTE — Therapy (Signed)
[facility] [address] [place], [place], [address] Phone: [phone]   Fax:  [phone]  Physical Therapy Evaluation  Patient Details  Name: Tonya Hall MRN: [id number] Date of Birth: December 12, 1950 Referring Provider (PT): Frederich Cha    Encounter Date: 12/18/2018  PT End of Session - 12/18/18 1625    Visit Number  1    Number of Visits  20    Date for PT Re-Evaluation  01/17/19    PT Start Time  1540    PT Stop Time  1618    PT Time Calculation (min)  38 min    Activity Tolerance  Patient tolerated treatment well    Behavior During Therapy  [facility] for tasks assessed/performed       Past Medical History:  Diagnosis Date  . Asthma    Albuterol in haler prn  . Cataract    immature unsure which eye  . Chronic back pain   . Degenerative disk disease    psoriatic  . Diabetes mellitus without complication (HCC)    diet controlled  . Esophageal motility disorder    Non-specific, see modified barium study/speech path, BP  . GERD (gastroesophageal reflux disease)    takes Protonix bid  . History of blood transfusion    post c-section  . History of bronchitis   . HTN (hypertension)   . Hyperlipidemia    takes Pravastatin daily  . Lymphocytic colitis 05/26/2010   Responded to Entocort x 3 MOS  . Neuropathy   . Osteoporosis    gets Boniva every 3 months  . Peripheral edema    takes HCTZ daily  . PONV (postoperative nausea and vomiting)   . Psoriatic arthritis ([place])    Dr. Katherina Right  . Raynaud's disease   . Seasonal allergies   . SUI (stress urinary incontinence, female)   . Urinary urgency     Past Surgical History:  Procedure Laterality Date  . BIOPSY  04/17/2016   Procedure: BIOPSY;  Surgeon: Danie Binder, MD;  [facility];  Service: Endoscopy;;  random colon bx's  . CERVICAL SPINE SURGERY  09/09/2017  . CESAREAN SECTION  1974, 1978      . COLONOSCOPY  06/19/2008   [id number] internal  hemorrhoids/mild sigmoin colon diverticulosis  . COLONOSCOPY  2012   Dr. Gala Romney: normal rectum, diverticula, lymphocytic colitis   . COLONOSCOPY WITH PROPOFOL N/A 04/17/2016   Procedure: COLONOSCOPY WITH PROPOFOL;  Surgeon: Danie Binder, MD;  [facility];  Service: Endoscopy;  Laterality: N/A;  900  . DILATION AND CURETTAGE OF UTERUS  1977   spontaneous abortion  . ESOPHAGOGASTRODUODENOSCOPY  06/19/2008   [id number] gastritis  . LUMBAR DISC ARTHROPLASTY  7/98  . LUMBAR FUSION  6/03, 11/08, 11/14   L4-5 fusion, L2-3 fusion, L1-2  . ODONTOID SCREW INSERTION N/A 01/27/2018   Procedure: ODONTOID SCREW INSERTION;  Surgeon: Newman Pies, MD;  Location: [place];  Service: Neurosurgery;  Laterality: N/A;  ODONTOID SCREW INSERTION  . TONSILLECTOMY AND ADENOIDECTOMY    . TUBAL LIGATION      There were no vitals filed for this visit.   Subjective Assessment - 12/18/18 1538    Subjective  Pt states that she had cervical surgery on 09/09/2017.  She fell in October and needed to have a second surgery to repair hardware had an odentoid screw insert on 01/27/2018.  She continued to have limited ROM, headaches and pain therefore she is being referred to  skilled PT.    Pertinent History  psoriatic OA, 3 prior back surgeries, DM,    Limitations  Reading;Lifting;House hold activities    Patient Stated Goals  decreased headaches, improved ROM    Currently in Pain?  Yes    Pain Score  2    highest is a 5 in the neck; Rt arm pain 8/10   Pain Location  Neck    Pain Orientation  Right    Pain Descriptors / Indicators  Aching;Throbbing    Pain Type  Chronic pain    Pain Radiating Towards  to hand every 2-3 days    Pain Onset  More than a month ago    Pain Frequency  Intermittent    Aggravating Factors   sitting on the sofa without support to her neck, sitting in the car    Effect of Pain on Daily Activities  limits         [facility] PT Assessment - 12/18/18 0001      Assessment   Medical  Diagnosis  Cervical pain/stiffness after surgery     Referring Provider (PT)  Frederich Cha     Onset Date/Surgical Date  01/27/18    Hand Dominance  Right    Next MD Visit  05/2020    Prior Therapy  [facility]      Precautions   Precautions  None;Fall      Restrictions   Weight Bearing Restrictions  No      Balance Screen   Has the patient fallen in the past 6 months  No    How many times?  0    Has the patient had a decrease in activity level because of a fear of falling?   Yes    Is the patient reluctant to leave their home because of a fear of falling?   No      Home Film/video editor residence      Prior Function   Level of Independence  Independent      Cognition   Overall Cognitive Status  Within Functional Limits for tasks assessed      Observation/Other Assessments   Focus on Therapeutic Outcomes (FOTO)   41-59% limited      Posture/Postural Control   Posture/Postural Control  Postural limitations    Postural Limitations  Rounded Shoulders;Forward head;Decreased lumbar lordosis;Decreased thoracic kyphosis;Flexed trunk      ROM / Strength   AROM / PROM / Strength  AROM;Strength      AROM   AROM Assessment Site  Cervical    Cervical Flexion  57    Cervical Extension  23    Cervical - Right Side Bend  8    Cervical - Left Side Bend  15    Cervical - Right Rotation  25    Cervical - Left Rotation  35      Strength   Strength Assessment Site  Cervical    Cervical Extension  3-/5    Cervical - Right Side Bend  2/5    Cervical - Left Side Bend  2+/5      Palpation   Palpation comment  multiple marked spasms throughout both trapezius                 Objective measurements completed on examination: See above findings.      [facility] Adult PT Treatment/Exercise - 12/18/18 0001      Exercises   Exercises  Neck  Neck Exercises: Seated   Other Seated Exercise  3 D cervical excursion x 3;    Other Seated Exercise  scapular  retraction , sitting tall x 5              PT Education - 12/18/18 1623    Education Details  HEP; to try and read at a level height by placing pillows on her lap.    Person(s) Educated  Patient    Methods  Explanation;Handout;Verbal cues    Comprehension  Verbalized understanding       PT Short Term Goals - 12/18/18 1635      PT SHORT TERM GOAL #1   Title  PT Rt UE radiating pain to be no greater than a 4/10 and to go no further than the elbow to demonstrate decreased nerve root irritation.    Time  4    Period  Weeks    Status  New    Target Date  01/15/19      PT SHORT TERM GOAL #2   Title  PT to only be having headaches once a week    Time  4    Period  Weeks    Status  New      PT SHORT TERM GOAL #3   Title  PT cervical extension to improve 15 degrees to allow pt to put her eyedrops in her eyes easier.    Time  4    Period  Weeks    Status  New      PT SHORT TERM GOAL #4   Title  PT cervical rotation to improve by 15 degrees to improve safety of driving.    Time  4    Period  Weeks        PT Long Term Goals - 12/18/18 1638      PT LONG TERM GOAL #1   Title  Pt  to have no radicular sx in her right arm and her cervical  pain to be no greater than a 2/10    Time  8    Period  Weeks    Status  New      PT LONG TERM GOAL #2   Title  PT to be I in self stretching exercises to decrease mm spasms in upper trap bilaterally to mild to assist in decreasing pain.    Time  8    Period  Weeks    Status  New      PT LONG TERM GOAL #3   Title  Pt cervical rotation to improve 30 degrees in both directions to improve safety of driving.    Time  8    Period  Weeks    Status  New      PT LONG TERM GOAL #4   Title  Cervical strength to be increased to no less than a 4/5 so that pt has no more headaches.    Time  8    Period  Weeks    Status  New             Plan - 12/18/18 1625    Clinical Impression Statement  Ms. Pross is a 68 yo female who had  cervical surgery in June of 2019 and was doing well until October of 2019 when she tripped over a dog and fell causing a disruption of the hardware.  This required a second surgery.  Since the second surgery she is having Rt arm pain, stiffness and pain  in her neck, decreased ROM, muscle spasm and headaches.  She is being referred and will benefit from skilled PT to address these issues.    Personal Factors and Comorbidities  Comorbidity 3+;Past/Current Experience;Time since onset of injury/illness/exacerbation    Comorbidities  psoriatric arthritis, 3 back surgeries, 2 neck surgeries,    Examination-Activity Limitations  Bathing;Lift;Other    Examination-Participation Restrictions  Church;Other    Stability/Clinical Decision Making  Evolving/Moderate complexity    Clinical Decision Making  Moderate    Rehab Potential  Good    PT Frequency  3x / week    PT Duration  4 weeks   Then 2x a week for 4 weeks   PT Treatment/Interventions  ADLs/Self Care Home Management;Patient/family education;Manual techniques;Dry needling;Therapeutic exercise;Therapeutic activities;Electrical Stimulation    PT Next Visit Plan  Begin decompression exercises and manual, give decompression exercises as HEP try and get pt to be able to complete sitting axial extension unable to this session, progress to isometrics, tband decompression exercises, thoracic excursion.  Promote improved posture and mobility then stability.    PT Home Exercise Plan  scapular retraction, sitting tall, cervical excursion    Consulted and Agree with Plan of Care  Patient       Patient will benefit from skilled therapeutic intervention in order to improve the following deficits and impairments:  Decreased range of motion, Decreased strength, Increased fascial restricitons, Increased muscle spasms, Impaired perceived functional ability, Impaired sensation, Impaired UE functional use, Pain, Postural dysfunction  Visit Diagnosis: Other muscle spasm  - Plan: PT plan of care cert/re-cert  Radiculopathy, cervical region - Plan: PT plan of care cert/re-cert     Problem List Patient Active Problem List   Diagnosis Date Noted  . Closed nondisplaced odontoid fracture with type II morphology ([place]) 01/27/2018  . Odontoid fracture ([place]) 01/24/2018  . Chest pain 11/14/2017  . Transaminitis 06/22/2016  . Collagenous colitis 05/23/2016  . Psoriasis 05/21/2016  . High risk medication use 02/15/2016  . Age-related osteoporosis without current pathological fracture 02/15/2016  . Trochanteric bursitis, left hip 02/15/2016  . Sacroiliitis, not elsewhere classified ([place]) 02/15/2016  . Chronic pain syndrome 02/15/2016  . Abnormal weight gain 06/25/2012  . Hypertension 07/03/2011  . Hypercholesteremia 07/03/2011  . Psoriatic arthritis ([place]) 07/03/2011  . Osteoarthritis of multiple joints 07/03/2011  . Esophageal motility disorder 02/14/2011  . Cough 02/14/2011  . GERD 05/05/2010    Rayetta Humphrey, PT CLT [phone] 12/18/2018, 4:45 PM  [facility] [address] [place], [place], [address] Phone: [phone]   Fax:  [phone]  Name: TAZIYA ROBIN MRN: [id number] Date of Birth: [birth year]

## 2018-12-18 NOTE — Patient Instructions (Addendum)
Scapular Retraction (Standing)    With arms at sides, pinch shoulder blades together.  May do sitting Repeat _5___ times per set. Do __1__ sets per session. Do _2___ sessions per day.  http://orth.exer.us/944   Copyright  VHI. All rights reserved.    Sit as tall as you can hold for 5 seconds repeat 5 x do 3x a day

## 2018-12-19 ENCOUNTER — Telehealth (HOSPITAL_COMMUNITY): Payer: Self-pay

## 2018-12-19 ENCOUNTER — Telehealth (HOSPITAL_COMMUNITY): Payer: Self-pay | Admitting: Internal Medicine

## 2018-12-19 NOTE — Telephone Encounter (Signed)
pt called to cx - no reason was given  10/20 appt  12/19/18

## 2018-12-19 NOTE — Telephone Encounter (Signed)
had to cancel this appt on 12/29/2018 due to the provider will be out of the office. appt has been rescheduled.

## 2018-12-19 NOTE — Telephone Encounter (Signed)
Pt called back to confirm 12/30/2018 apptment changes are ok. NF 12/19/2018

## 2018-12-23 ENCOUNTER — Encounter (HOSPITAL_COMMUNITY): Payer: Self-pay

## 2018-12-23 ENCOUNTER — Other Ambulatory Visit: Payer: Self-pay

## 2018-12-23 ENCOUNTER — Ambulatory Visit (HOSPITAL_COMMUNITY): Payer: PPO

## 2018-12-23 DIAGNOSIS — M62838 Other muscle spasm: Secondary | ICD-10-CM | POA: Diagnosis not present

## 2018-12-23 DIAGNOSIS — M5412 Radiculopathy, cervical region: Secondary | ICD-10-CM

## 2018-12-23 NOTE — Therapy (Signed)
[facility] [address] [place], [place], [address] Phone: [phone]   Fax:  [phone]  Physical Therapy Treatment  Patient Details  Name: Tonya Hall MRN: [id number] Date of Birth: 07-08-1950 Referring Provider (PT): Frederich Cha    Encounter Date: 12/23/2018  PT End of Session - 12/23/18 1325    Visit Number  2    Number of Visits  20    Date for PT Re-Evaluation  01/17/19    Authorization Type  [employer]    Authorization Time Period  9/17-->02/17/19    PT Start Time  1322    PT Stop Time  1400    PT Time Calculation (min)  38 min    Activity Tolerance  Patient tolerated treatment well    Behavior During Therapy  [facility] for tasks assessed/performed       Past Medical History:  Diagnosis Date  . Asthma    Albuterol in haler prn  . Cataract    immature unsure which eye  . Chronic back pain   . Degenerative disk disease    psoriatic  . Diabetes mellitus without complication (HCC)    diet controlled  . Esophageal motility disorder    Non-specific, see modified barium study/speech path, BP  . GERD (gastroesophageal reflux disease)    takes Protonix bid  . History of blood transfusion    post c-section  . History of bronchitis   . HTN (hypertension)   . Hyperlipidemia    takes Pravastatin daily  . Lymphocytic colitis 05/26/2010   Responded to Entocort x 3 MOS  . Neuropathy   . Osteoporosis    gets Boniva every 3 months  . Peripheral edema    takes HCTZ daily  . PONV (postoperative nausea and vomiting)   . Psoriatic arthritis ([place])    Dr. Katherina Right  . Raynaud's disease   . Seasonal allergies   . SUI (stress urinary incontinence, female)   . Urinary urgency     Past Surgical History:  Procedure Laterality Date  . BIOPSY  04/17/2016   Procedure: BIOPSY;  Surgeon: Danie Binder, MD;  [facility];  Service: Endoscopy;;  random colon bx's  . CERVICAL SPINE SURGERY  09/09/2017  .  CESAREAN SECTION  1974, 1978      . COLONOSCOPY  06/19/2008   [id number] internal hemorrhoids/mild sigmoin colon diverticulosis  . COLONOSCOPY  2012   Dr. Gala Romney: normal rectum, diverticula, lymphocytic colitis   . COLONOSCOPY WITH PROPOFOL N/A 04/17/2016   Procedure: COLONOSCOPY WITH PROPOFOL;  Surgeon: Danie Binder, MD;  [facility];  Service: Endoscopy;  Laterality: N/A;  900  . DILATION AND CURETTAGE OF UTERUS  1977   spontaneous abortion  . ESOPHAGOGASTRODUODENOSCOPY  06/19/2008   [id number] gastritis  . LUMBAR DISC ARTHROPLASTY  7/98  . LUMBAR FUSION  6/03, 11/08, 11/14   L4-5 fusion, L2-3 fusion, L1-2  . ODONTOID SCREW INSERTION N/A 01/27/2018   Procedure: ODONTOID SCREW INSERTION;  Surgeon: Newman Pies, MD;  Location: [facility];  Service: Neurosurgery;  Laterality: N/A;  ODONTOID SCREW INSERTION  . TONSILLECTOMY AND ADENOIDECTOMY    . TUBAL LIGATION      There were no vitals filed for this visit.  Subjective Assessment - 12/23/18 1322    Subjective  Pt stated she is feeling good today, no reports of pain.  Has began HEP without questions.    Currently in Pain?  No/denies  [facility] Adult PT Treatment/Exercise - 12/23/18 0001      Posture/Postural Control   Posture/Postural Control  Postural limitations    Postural Limitations  Rounded Shoulders;Forward head;Decreased lumbar lordosis;Decreased thoracic kyphosis;Flexed trunk      Exercises   Exercises  Neck      Neck Exercises: Seated   Cervical Isometrics  Extension;Right lateral flexion;Left lateral flexion;Right rotation;Left rotation;3 secs;5 reps    Postural Training  Educated importance of proper posture; 1' for axial extension, difficult task    Other Seated Exercise  3 D cervical excursion 5 reps slowly    Other Seated Exercise  scapular retraction , sitting tall x 5       Neck Exercises: Supine   Other Supine Exercise  Decompression exercise 1-5 (min cueing for  head press mechanics      Manual Therapy   Manual Therapy  Soft tissue mobilization    Manual therapy comments  Manual complete separate     Soft tissue mobilization  Supine wiht LE elevated.  UT, levator, posterior cervical mm; gentle rotation              PT Education - 12/23/18 1329    Education Details  Reviewed goals, assured compliance with HEP, encouraged to focus on cervical mobility only (tends to side bend with thoracic vs cervical).  Discussed importance of posture for pain control.    Person(s) Educated  Patient    Methods  Explanation    Comprehension  Verbalized understanding       PT Short Term Goals - 12/18/18 1635      PT SHORT TERM GOAL #1   Title  PT Rt UE radiating pain to be no greater than a 4/10 and to go no further than the elbow to demonstrate decreased nerve root irritation.    Time  4    Period  Weeks    Status  New    Target Date  01/15/19      PT SHORT TERM GOAL #2   Title  PT to only be having headaches once a week    Time  4    Period  Weeks    Status  New      PT SHORT TERM GOAL #3   Title  PT cervical extension to improve 15 degrees to allow pt to put her eyedrops in her eyes easier.    Time  4    Period  Weeks    Status  New      PT SHORT TERM GOAL #4   Title  PT cervical rotation to improve by 15 degrees to improve safety of driving.    Time  4    Period  Weeks        PT Long Term Goals - 12/18/18 1638      PT LONG TERM GOAL #1   Title  Pt  to have no radicular sx in her right arm and her cervical  pain to be no greater than a 2/10    Time  8    Period  Weeks    Status  New      PT LONG TERM GOAL #2   Title  PT to be I in self stretching exercises to decrease mm spasms in upper trap bilaterally to mild to assist in decreasing pain.    Time  8    Period  Weeks    Status  New      PT LONG TERM GOAL #3  Title  Pt cervical rotation to improve 30 degrees in both directions to improve safety of driving.    Time  8     Period  Weeks    Status  New      PT LONG TERM GOAL #4   Title  Cervical strength to be increased to no less than a 4/5 so that pt has no more headaches.    Time  8    Period  Weeks    Status  New            Plan - 12/23/18 1601    Clinical Impression Statement  Reviewed goals and assured compliance with HEP.  Pt able to recall and demonstrate approriate mechanics iwht HEP, cueing to reduce compensation with thoracic movements to increase cervical mobility.  Began therex with decompression exercises for posture strengthening as well as isometric exercises for cervical musculature strenghtneing.  EOS with manual soft tissue mobilizaiton to address tight cervical mm limiting mobility and pain.  No reports of pain through session.    Personal Factors and Comorbidities  Comorbidity 3+;Past/Current Experience;Time since onset of injury/illness/exacerbation    Comorbidities  psoriatric arthritis, 3 back surgeries, 2 neck surgeries,    Examination-Activity Limitations  Bathing;Lift;Other    Examination-Participation Restrictions  Church;Other    Stability/Clinical Decision Making  Evolving/Moderate complexity    Clinical Decision Making  Moderate    Rehab Potential  Good    PT Frequency  3x / week    PT Duration  4 weeks   Then 2x/week for 4 weeks   PT Treatment/Interventions  ADLs/Self Care Home Management;Patient/family education;Manual techniques;Dry needling;Therapeutic exercise;Therapeutic activities;Electrical Stimulation    PT Next Visit Plan  Sitting axial extension and continues isometrics.  Progressed to t-band decompression exercises and thoracic excursion.  Promote improved posture and mobility then stability.    PT Home Exercise Plan  scapular retraction, sitting tall, cervical excursion; 9/22: decompression exercise       Patient will benefit from skilled therapeutic intervention in order to improve the following deficits and impairments:  Decreased range of motion, Decreased  strength, Increased fascial restricitons, Increased muscle spasms, Impaired perceived functional ability, Impaired sensation, Impaired UE functional use, Pain, Postural dysfunction  Visit Diagnosis: Other muscle spasm  Radiculopathy, cervical region     Problem List Patient Active Problem List   Diagnosis Date Noted  . Closed nondisplaced odontoid fracture with type II morphology ([place]) 01/27/2018  . Odontoid fracture ([place]) 01/24/2018  . Chest pain 11/14/2017  . Transaminitis 06/22/2016  . Collagenous colitis 05/23/2016  . Psoriasis 05/21/2016  . High risk medication use 02/15/2016  . Age-related osteoporosis without current pathological fracture 02/15/2016  . Trochanteric bursitis, left hip 02/15/2016  . Sacroiliitis, not elsewhere classified ([place]) 02/15/2016  . Chronic pain syndrome 02/15/2016  . Abnormal weight gain 06/25/2012  . Hypertension 07/03/2011  . Hypercholesteremia 07/03/2011  . Psoriatic arthritis ([place]) 07/03/2011  . Osteoarthritis of multiple joints 07/03/2011  . Esophageal motility disorder 02/14/2011  . Cough 02/14/2011  . GERD 05/05/2010   Ihor Austin, [place]; [place]  Aldona Lento 12/23/2018, 4:07 PM  [facility] [address] [place], [place], [address] Phone: [phone]   Fax:  [phone]  Name: DEBBI TUKES MRN: [id number] Date of Birth: 30-Mar-1951

## 2018-12-25 ENCOUNTER — Ambulatory Visit (HOSPITAL_COMMUNITY): Payer: PPO

## 2018-12-25 ENCOUNTER — Other Ambulatory Visit: Payer: Self-pay

## 2018-12-25 ENCOUNTER — Encounter (HOSPITAL_COMMUNITY): Payer: Self-pay

## 2018-12-25 DIAGNOSIS — M62838 Other muscle spasm: Secondary | ICD-10-CM

## 2018-12-25 DIAGNOSIS — M5412 Radiculopathy, cervical region: Secondary | ICD-10-CM

## 2018-12-25 NOTE — Therapy (Signed)
[facility] [address] [place], [place], [address] Phone: [phone]   Fax:  [phone]  Physical Therapy Treatment  Patient Details  Name: Tonya Hall MRN: [id number] Date of Birth: [birth year] Referring Provider (PT): Frederich Cha    Encounter Date: 12/25/2018  PT End of Session - 12/25/18 1625    Visit Number  3    Number of Visits  20    Date for PT Re-Evaluation  01/17/19    Authorization Type  [employer]    Authorization Time Period  9/17-->02/17/19    PT Start Time  1420    PT Stop Time  1508    PT Time Calculation (min)  48 min    Activity Tolerance  Patient tolerated treatment well    Behavior During Therapy  [facility] for tasks assessed/performed       Past Medical History:  Diagnosis Date  . Asthma    Albuterol in haler prn  . Cataract    immature unsure which eye  . Chronic back pain   . Degenerative disk disease    psoriatic  . Diabetes mellitus without complication (HCC)    diet controlled  . Esophageal motility disorder    Non-specific, see modified barium study/speech path, BP  . GERD (gastroesophageal reflux disease)    takes Protonix bid  . History of blood transfusion    post c-section  . History of bronchitis   . HTN (hypertension)   . Hyperlipidemia    takes Pravastatin daily  . Lymphocytic colitis 05/26/2010   Responded to Entocort x 3 MOS  . Neuropathy   . Osteoporosis    gets Boniva every 3 months  . Peripheral edema    takes HCTZ daily  . PONV (postoperative nausea and vomiting)   . Psoriatic arthritis ([place])    Dr. Katherina Right  . Raynaud's disease   . Seasonal allergies   . SUI (stress urinary incontinence, female)   . Urinary urgency     Past Surgical History:  Procedure Laterality Date  . BIOPSY  04/17/2016   Procedure: BIOPSY;  Surgeon: Danie Binder, MD;  [facility];  Service: Endoscopy;;  random colon bx's  . CERVICAL SPINE SURGERY  09/09/2017  .  CESAREAN SECTION  1974, 1978      . COLONOSCOPY  06/19/2008   [id number] internal hemorrhoids/mild sigmoin colon diverticulosis  . COLONOSCOPY  2012   Dr. Gala Romney: normal rectum, diverticula, lymphocytic colitis   . COLONOSCOPY WITH PROPOFOL N/A 04/17/2016   Procedure: COLONOSCOPY WITH PROPOFOL;  Surgeon: Danie Binder, MD;  [facility];  Service: Endoscopy;  Laterality: N/A;  900  . DILATION AND CURETTAGE OF UTERUS  1977   spontaneous abortion  . ESOPHAGOGASTRODUODENOSCOPY  06/19/2008   [id number] gastritis  . LUMBAR DISC ARTHROPLASTY  7/98  . LUMBAR FUSION  6/03, 11/08, 11/14   L4-5 fusion, L2-3 fusion, L1-2  . ODONTOID SCREW INSERTION N/A 01/27/2018   Procedure: ODONTOID SCREW INSERTION;  Surgeon: Newman Pies, MD;  Location: [facility];  Service: Neurosurgery;  Laterality: N/A;  ODONTOID SCREW INSERTION  . TONSILLECTOMY AND ADENOIDECTOMY    . TUBAL LIGATION      There were no vitals filed for this visit.  Subjective Assessment - 12/25/18 1624    Subjective  Pt reports she is feeling good today.  No reports of pain and reports decrease in headaches over the last 5 days.    Pertinent History  psoriatic OA, 3  prior back surgeries, DM,    Patient Stated Goals  decreased headaches, improved ROM    Currently in Pain?  No/denies                       [facility] Adult PT Treatment/Exercise - 12/25/18 0001      Posture/Postural Control   Posture/Postural Control  Postural limitations    Postural Limitations  Rounded Shoulders;Forward head;Decreased lumbar lordosis;Decreased thoracic kyphosis;Flexed trunk      Exercises   Exercises  Neck      Neck Exercises: Seated   Cervical Isometrics Limitations  HEP    W Back  10 reps    Postural Training  Sitting tall    Other Seated Exercise  3 D cervical excursion 5 reps slowly    Other Seated Exercise  3D thoracic excursion 10x       Manual Therapy   Manual Therapy  Soft tissue mobilization    Manual therapy  comments  Manual complete separate     Soft tissue mobilization  Supine wiht LE elevated.  UT, levator, posterior cervical mm; gentle rotation; suboccipital release x 48min               PT Short Term Goals - 12/18/18 1635      PT SHORT TERM GOAL #1   Title  PT Rt UE radiating pain to be no greater than a 4/10 and to go no further than the elbow to demonstrate decreased nerve root irritation.    Time  4    Period  Weeks    Status  New    Target Date  01/15/19      PT SHORT TERM GOAL #2   Title  PT to only be having headaches once a week    Time  4    Period  Weeks    Status  New      PT SHORT TERM GOAL #3   Title  PT cervical extension to improve 15 degrees to allow pt to put her eyedrops in her eyes easier.    Time  4    Period  Weeks    Status  New      PT SHORT TERM GOAL #4   Title  PT cervical rotation to improve by 15 degrees to improve safety of driving.    Time  4    Period  Weeks        PT Long Term Goals - 12/18/18 1638      PT LONG TERM GOAL #1   Title  Pt  to have no radicular sx in her right arm and her cervical  pain to be no greater than a 2/10    Time  8    Period  Weeks    Status  New      PT LONG TERM GOAL #2   Title  PT to be I in self stretching exercises to decrease mm spasms in upper trap bilaterally to mild to assist in decreasing pain.    Time  8    Period  Weeks    Status  New      PT LONG TERM GOAL #3   Title  Pt cervical rotation to improve 30 degrees in both directions to improve safety of driving.    Time  8    Period  Weeks    Status  New      PT LONG TERM GOAL #4   Title  Cervical  strength to be increased to no less than a 4/5 so that pt has no more headaches.    Time  8    Period  Weeks    Status  New            Plan - 12/25/18 1720    Clinical Impression Statement  Continued session focus wiht cervical mobility and postural strengthening.  Progressed to decompression exercise with theraband resistance for  strengthening, min cueing to improve form and mechanics.  Added thoracic excursion for spinal mobility.  EOS with manual soft tissue mobilization to address tight cervical mm limiting mobility and pain.  Noted suboccipital tension, added release to manual POC.  No reports of pain through session.    Personal Factors and Comorbidities  Comorbidity 3+;Past/Current Experience;Time since onset of injury/illness/exacerbation    Comorbidities  psoriatric arthritis, 3 back surgeries, 2 neck surgeries,    Examination-Activity Limitations  Bathing;Lift;Other    Examination-Participation Restrictions  Church;Other    Stability/Clinical Decision Making  Evolving/Moderate complexity    Clinical Decision Making  Moderate    Rehab Potential  Good    PT Frequency  3x / week    PT Duration  4 weeks   Then 2x/ week for 4 weeks   PT Treatment/Interventions  ADLs/Self Care Home Management;Patient/family education;Manual techniques;Dry needling;Therapeutic exercise;Therapeutic activities;Electrical Stimulation    PT Next Visit Plan  Add supine retraction next session.  Continue with sitting axial extension, cervical mobility, t-band decompression exercises and thoracic excursion.  Promote improved posture and mobility then stability.    PT Home Exercise Plan  scapular retraction, sitting tall, cervical excursion; 9/22: decompression exercise and isometric       Patient will benefit from skilled therapeutic intervention in order to improve the following deficits and impairments:  Decreased range of motion, Decreased strength, Increased fascial restricitons, Increased muscle spasms, Impaired perceived functional ability, Impaired sensation, Impaired UE functional use, Pain, Postural dysfunction  Visit Diagnosis: Radiculopathy, cervical region  Other muscle spasm     Problem List Patient Active Problem List   Diagnosis Date Noted  . Closed nondisplaced odontoid fracture with type II morphology ([place])  01/27/2018  . Odontoid fracture ([place]) 01/24/2018  . Chest pain 11/14/2017  . Transaminitis 06/22/2016  . Collagenous colitis 05/23/2016  . Psoriasis 05/21/2016  . High risk medication use 02/15/2016  . Age-related osteoporosis without current pathological fracture 02/15/2016  . Trochanteric bursitis, left hip 02/15/2016  . Sacroiliitis, not elsewhere classified ([place]) 02/15/2016  . Chronic pain syndrome 02/15/2016  . Abnormal weight gain 06/25/2012  . Hypertension 07/03/2011  . Hypercholesteremia 07/03/2011  . Psoriatic arthritis ([place]) 07/03/2011  . Osteoarthritis of multiple joints 07/03/2011  . Esophageal motility disorder 02/14/2011  . Cough 02/14/2011  . GERD 05/05/2010   Ihor Austin, [place]; [place]  Aldona Lento 12/25/2018, 5:27 PM  [facility] [place], [place], [address] Phone: [phone]   Fax:  [phone]  Name: Tonya Hall MRN: [id number] Date of Birth: [birth year]

## 2018-12-25 NOTE — Patient Instructions (Signed)
Neck: Lateral Tilt    Sit with back straight. Tilt head slightly to right while resisting with fingertips. Do not let head move. Do not allow trunk to lean sideways. Hold 5 seconds. Repeat 10 times. Do 1 sessions every other day. CAUTION: Use steady pressure with fingertips only.  Copyright  VHI. All rights reserved.   Isometric Rotation    Put left index finger on left temple. Gently try to turn head to right, pushing against finger. Hold 5 seconds. Repeat on the left. Push and release slowly. Repeat 10 times. Do 1 sessions every other day.  http://gt2.exer.us/25   Copyright  VHI. All rights reserved.    Isometric Extension    Put index fingers gently on back of head. Slowly try to look toward ceiling. Push head into fingers for 5 seconds. Push and release slowly. Repeat 10 times. Do 1 sessions every other day.  http://gt2.exer.us/21   Copyright  VHI. All rights reserved.

## 2018-12-26 ENCOUNTER — Ambulatory Visit (HOSPITAL_COMMUNITY): Payer: PPO

## 2018-12-26 ENCOUNTER — Encounter (HOSPITAL_COMMUNITY): Payer: Self-pay

## 2018-12-26 DIAGNOSIS — M62838 Other muscle spasm: Secondary | ICD-10-CM | POA: Diagnosis not present

## 2018-12-26 DIAGNOSIS — M5412 Radiculopathy, cervical region: Secondary | ICD-10-CM

## 2018-12-26 NOTE — Therapy (Signed)
[facility] [address] [place], [place], [address] Phone: [phone]   Fax:  [phone]  Physical Therapy Treatment  Patient Details  Name: Tonya Hall MRN: [id number] Date of Birth: 01-Feb-1951 Referring Provider (PT): Frederich Cha    Encounter Date: 12/26/2018  PT End of Session - 12/26/18 0844    Visit Number  4    Number of Visits  20    Date for PT Re-Evaluation  01/17/19    Authorization Type  [employer]    Authorization Time Period  9/17-->02/17/19    PT Start Time  0836    PT Stop Time  0923    PT Time Calculation (min)  47 min    Activity Tolerance  Patient tolerated treatment well    Behavior During Therapy  [facility] for tasks assessed/performed       Past Medical History:  Diagnosis Date  . Asthma    Albuterol in haler prn  . Cataract    immature unsure which eye  . Chronic back pain   . Degenerative disk disease    psoriatic  . Diabetes mellitus without complication (HCC)    diet controlled  . Esophageal motility disorder    Non-specific, see modified barium study/speech path, BP  . GERD (gastroesophageal reflux disease)    takes Protonix bid  . History of blood transfusion    post c-section  . History of bronchitis   . HTN (hypertension)   . Hyperlipidemia    takes Pravastatin daily  . Lymphocytic colitis 05/26/2010   Responded to Entocort x 3 MOS  . Neuropathy   . Osteoporosis    gets Boniva every 3 months  . Peripheral edema    takes HCTZ daily  . PONV (postoperative nausea and vomiting)   . Psoriatic arthritis ([place])    Dr. Katherina Right  . Raynaud's disease   . Seasonal allergies   . SUI (stress urinary incontinence, female)   . Urinary urgency     Past Surgical History:  Procedure Laterality Date  . BIOPSY  04/17/2016   Procedure: BIOPSY;  Surgeon: Danie Binder, MD;  [facility];  Service: Endoscopy;;  random colon bx's  . CERVICAL SPINE SURGERY  09/09/2017  .  CESAREAN SECTION  1974, 1978      . COLONOSCOPY  06/19/2008   [id number] internal hemorrhoids/mild sigmoin colon diverticulosis  . COLONOSCOPY  2012   Dr. Gala Romney: normal rectum, diverticula, lymphocytic colitis   . COLONOSCOPY WITH PROPOFOL N/A 04/17/2016   Procedure: COLONOSCOPY WITH PROPOFOL;  Surgeon: Danie Binder, MD;  [facility];  Service: Endoscopy;  Laterality: N/A;  900  . DILATION AND CURETTAGE OF UTERUS  1977   spontaneous abortion  . ESOPHAGOGASTRODUODENOSCOPY  06/19/2008   [id number] gastritis  . LUMBAR DISC ARTHROPLASTY  7/98  . LUMBAR FUSION  6/03, 11/08, 11/14   L4-5 fusion, L2-3 fusion, L1-2  . ODONTOID SCREW INSERTION N/A 01/27/2018   Procedure: ODONTOID SCREW INSERTION;  Surgeon: Newman Pies, MD;  Location: [facility];  Service: Neurosurgery;  Laterality: N/A;  ODONTOID SCREW INSERTION  . TONSILLECTOMY AND ADENOIDECTOMY    . TUBAL LIGATION      There were no vitals filed for this visit.  Subjective Assessment - 12/26/18 0841    Subjective  Pt reports she is feeling good, reports no headache and feels she can move neck easier.  Does have some LBP today, nagging pain 2/10    Pertinent History  psoriatic OA, 3 prior back surgeries, DM,    Patient Stated Goals  decreased headaches, improved ROM    Currently in Pain?  Yes    Pain Score  2     Pain Location  Back    Pain Orientation  Lower    Pain Descriptors / Indicators  Nagging    Pain Type  Chronic pain    Pain Onset  More than a month ago    Pain Frequency  Intermittent    Aggravating Factors   sitting on the sofa wihtout support to her neck, siting in the car    Effect of Pain on Daily Activities  limits                       [facility] Adult PT Treatment/Exercise - 12/26/18 0001      Posture/Postural Control   Posture/Postural Control  Postural limitations    Postural Limitations  Rounded Shoulders;Forward head;Decreased lumbar lordosis;Decreased thoracic kyphosis;Flexed trunk       Exercises   Exercises  Neck      Neck Exercises: Standing   Other Standing Exercises  Wall arch, Y- 10x 3"      Neck Exercises: Seated   W Back  10 reps    W Back Weights (lbs)  5" holds    Postural Training  Sitting tall    Other Seated Exercise  3D cervical and thoracic excursion      Neck Exercises: Supine   Neck Retraction  10 reps;5 secs    Neck Retraction Limitations  Tactile cueing    Other Supine Exercise  attempted thoracic extension with 1/2 bolster, reports increased LBP so DC exercise      Manual Therapy   Manual Therapy  Soft tissue mobilization    Manual therapy comments  Manual complete separate     Soft tissue mobilization  Supine wiht LE elevated, palms up.  UT, levator, posterior cervical mm; gentle rotation; suboccipital release x 73min               PT Short Term Goals - 12/18/18 1635      PT SHORT TERM GOAL #1   Title  PT Rt UE radiating pain to be no greater than a 4/10 and to go no further than the elbow to demonstrate decreased nerve root irritation.    Time  4    Period  Weeks    Status  New    Target Date  01/15/19      PT SHORT TERM GOAL #2   Title  PT to only be having headaches once a week    Time  4    Period  Weeks    Status  New      PT SHORT TERM GOAL #3   Title  PT cervical extension to improve 15 degrees to allow pt to put her eyedrops in her eyes easier.    Time  4    Period  Weeks    Status  New      PT SHORT TERM GOAL #4   Title  PT cervical rotation to improve by 15 degrees to improve safety of driving.    Time  4    Period  Weeks        PT Long Term Goals - 12/18/18 1638      PT LONG TERM GOAL #1   Title  Pt  to have no radicular sx in her right arm and her cervical  pain to be no greater than a 2/10    Time  8    Period  Weeks    Status  New      PT LONG TERM GOAL #2   Title  PT to be I in self stretching exercises to decrease mm spasms in upper trap bilaterally to mild to assist in decreasing pain.     Time  8    Period  Weeks    Status  New      PT LONG TERM GOAL #3   Title  Pt cervical rotation to improve 30 degrees in both directions to improve safety of driving.    Time  8    Period  Weeks    Status  New      PT LONG TERM GOAL #4   Title  Cervical strength to be increased to no less than a 4/5 so that pt has no more headaches.    Time  8    Period  Weeks    Status  New            Plan - 12/26/18 [id number]    Clinical Impression Statement  Added thoracic extension based exercises for spinal mobility and postural strengthening.  Verbal and tactile cueing to improve mechanics with cervical retraction, added supine chin tucks to POC.  EOS with manual soft tissue mobilization and gentle PROM for rotation for cervical mobility.  No reports of pain through session.  Did attempt supine thoracic extension wiht 1/2 bolster, DC'd exercise due to reports of LBP.    Personal Factors and Comorbidities  Comorbidity 3+;Past/Current Experience;Time since onset of injury/illness/exacerbation    Examination-Activity Limitations  Bathing;Lift;Other    Examination-Participation Restrictions  Church;Other    Stability/Clinical Decision Making  Evolving/Moderate complexity    Clinical Decision Making  Moderate    Rehab Potential  Good    PT Frequency  3x / week    PT Duration  4 weeks    PT Treatment/Interventions  ADLs/Self Care Home Management;Patient/family education;Manual techniques;Dry needling;Therapeutic exercise;Therapeutic activities;Electrical Stimulation    PT Next Visit Plan  Continue cervical mobility and postural strengthening.  Continue cervical/thoracic excursion, axial extension.  Promote improved posture and mobility then stability.  Manual STM, may benefit from joint mobs.    PT Home Exercise Plan  scapular retraction, sitting tall, cervical excursion; 9/22: decompression exercise and isometric       Patient will benefit from skilled therapeutic intervention in order to improve  the following deficits and impairments:  Decreased range of motion, Decreased strength, Increased fascial restricitons, Increased muscle spasms, Impaired perceived functional ability, Impaired sensation, Impaired UE functional use, Pain, Postural dysfunction  Visit Diagnosis: Radiculopathy, cervical region  Other muscle spasm     Problem List Patient Active Problem List   Diagnosis Date Noted  . Closed nondisplaced odontoid fracture with type II morphology ([place]) 01/27/2018  . Odontoid fracture ([place]) 01/24/2018  . Chest pain 11/14/2017  . Transaminitis 06/22/2016  . Collagenous colitis 05/23/2016  . Psoriasis 05/21/2016  . High risk medication use 02/15/2016  . Age-related osteoporosis without current pathological fracture 02/15/2016  . Trochanteric bursitis, left hip 02/15/2016  . Sacroiliitis, not elsewhere classified ([place]) 02/15/2016  . Chronic pain syndrome 02/15/2016  . Abnormal weight gain 06/25/2012  . Hypertension 07/03/2011  . Hypercholesteremia 07/03/2011  . Psoriatic arthritis ([place]) 07/03/2011  . Osteoarthritis of multiple joints 07/03/2011  . Esophageal motility disorder 02/14/2011  . Cough 02/14/2011  . GERD 05/05/2010   Ihor Austin,  LPTA; CBIS [phone]  Aldona Lento 12/26/2018, 12:28 PM  [facility] [address] [place], [place], [address] Phone: [phone]   Fax:  [phone]  Name: Tonya Hall MRN: [id number] Date of Birth: June 17, 1950

## 2018-12-29 ENCOUNTER — Telehealth (HOSPITAL_COMMUNITY): Payer: Self-pay | Admitting: Internal Medicine

## 2018-12-29 ENCOUNTER — Encounter (HOSPITAL_COMMUNITY): Payer: PPO

## 2018-12-29 NOTE — Telephone Encounter (Signed)
12/29/18  pt can't come at this time has to pick up grandchild so we are rescheduling for 10/9 at [facility]

## 2018-12-30 ENCOUNTER — Other Ambulatory Visit: Payer: Self-pay

## 2018-12-30 ENCOUNTER — Ambulatory Visit (HOSPITAL_COMMUNITY): Payer: PPO | Admitting: Physical Therapy

## 2018-12-30 ENCOUNTER — Encounter (HOSPITAL_COMMUNITY): Payer: Self-pay | Admitting: Physical Therapy

## 2018-12-30 DIAGNOSIS — M62838 Other muscle spasm: Secondary | ICD-10-CM

## 2018-12-30 DIAGNOSIS — M5412 Radiculopathy, cervical region: Secondary | ICD-10-CM

## 2018-12-30 NOTE — Therapy (Signed)
[facility] [address] [place], [place], [address] Phone: [phone]   Fax:  [phone]  Physical Therapy Treatment  Patient Details  Name: Tonya Hall MRN: [id number] Date of Birth: [birth year] Referring Provider (PT): Frederich Cha    Encounter Date: 12/30/2018  PT End of Session - 12/30/18 1358    Visit Number  5    Number of Visits  20    Date for PT Re-Evaluation  01/17/19    Authorization Type  [employer]    Authorization Time Period  9/17-->02/17/19    Authorization - Visit Number  5    Authorization - Number of Visits  10    PT Start Time  [id number]    PT Stop Time  1215    PT Time Calculation (min)  44 min    Activity Tolerance  Patient tolerated treatment well    Behavior During Therapy  [facility] for tasks assessed/performed       Past Medical History:  Diagnosis Date  . Asthma    Albuterol in haler prn  . Cataract    immature unsure which eye  . Chronic back pain   . Degenerative disk disease    psoriatic  . Diabetes mellitus without complication (HCC)    diet controlled  . Esophageal motility disorder    Non-specific, see modified barium study/speech path, BP  . GERD (gastroesophageal reflux disease)    takes Protonix bid  . History of blood transfusion    post c-section  . History of bronchitis   . HTN (hypertension)   . Hyperlipidemia    takes Pravastatin daily  . Lymphocytic colitis 05/26/2010   Responded to Entocort x 3 MOS  . Neuropathy   . Osteoporosis    gets Boniva every 3 months  . Peripheral edema    takes HCTZ daily  . PONV (postoperative nausea and vomiting)   . Psoriatic arthritis ([place])    Dr. Katherina Right  . Raynaud's disease   . Seasonal allergies   . SUI (stress urinary incontinence, female)   . Urinary urgency     Past Surgical History:  Procedure Laterality Date  . BIOPSY  04/17/2016   Procedure: BIOPSY;  Surgeon: Danie Binder, MD;  [facility];  Service:  Endoscopy;;  random colon bx's  . CERVICAL SPINE SURGERY  09/09/2017  . CESAREAN SECTION  1974, 1978      . COLONOSCOPY  06/19/2008   [id number] internal hemorrhoids/mild sigmoin colon diverticulosis  . COLONOSCOPY  2012   Dr. Gala Romney: normal rectum, diverticula, lymphocytic colitis   . COLONOSCOPY WITH PROPOFOL N/A 04/17/2016   Procedure: COLONOSCOPY WITH PROPOFOL;  Surgeon: Danie Binder, MD;  Location: [facility];  Service: Endoscopy;  Laterality: N/A;  900  . DILATION AND CURETTAGE OF UTERUS  1977   spontaneous abortion  . ESOPHAGOGASTRODUODENOSCOPY  06/19/2008   [id number] gastritis  . LUMBAR DISC ARTHROPLASTY  7/98  . LUMBAR FUSION  6/03, 11/08, 11/14   L4-5 fusion, L2-3 fusion, L1-2  . ODONTOID SCREW INSERTION N/A 01/27/2018   Procedure: ODONTOID SCREW INSERTION;  Surgeon: Newman Pies, MD;  Location: [place];  Service: Neurosurgery;  Laterality: N/A;  ODONTOID SCREW INSERTION  . TONSILLECTOMY AND ADENOIDECTOMY    . TUBAL LIGATION      There were no vitals filed for this visit.  Subjective Assessment - 12/30/18 1135    Subjective  Pt reports she is feeling good, reports no headache and feels she  can move neck easier.  Does have some LBP today, nagging pain 2/10    Pertinent History  psoriatic OA, 3 prior back surgeries, DM,    Patient Stated Goals  decreased headaches, improved ROM    Pain Score  2     Pain Location  Neck    Pain Orientation  Right;Posterior    Pain Onset  More than a month ago                       [facility] Adult PT Treatment/Exercise - 12/30/18 0001      Neck Exercises: Standing   Other Standing Exercises  shoulder extension with scapular retraction with towel x15, 5" holds      Neck Exercises: Supine   Neck Retraction  5 secs;15 reps    Neck Retraction Limitations  Tactile cueing, physical assist    Other Supine Exercise  cerivcal ROT- PROM --> AAROM - tolerated well x15, each - 5" holds B      Manual Therapy   Manual Therapy   Joint mobilization;Soft tissue mobilization    Manual therapy comments  Manual complete separate     Joint Mobilization  APs of cervical spine (right side focus) grade II for pain - C2-4), medial glides of C3-5 R side focus but bilat - grade II for pain, UPA to c1 TP with PROM cervical ROT - tolerated well.  UPA to ribs 2-4 (supine) - bilat - grade II for pain     Soft tissue mobilization  SCM R>L, suboccipitals and cercical paraspinals bilaterally.              PT Education - 12/30/18 1400    Education Details  educated patient on posture, use of towel roll down spine in sitting (especially in car) and to have feet supported when sitting.    Person(s) Educated  Patient    Methods  Explanation    Comprehension  Verbalized understanding       PT Short Term Goals - 12/18/18 1635      PT SHORT TERM GOAL #1   Title  PT Rt UE radiating pain to be no greater than a 4/10 and to go no further than the elbow to demonstrate decreased nerve root irritation.    Time  4    Period  Weeks    Status  New    Target Date  01/15/19      PT SHORT TERM GOAL #2   Title  PT to only be having headaches once a week    Time  4    Period  Weeks    Status  New      PT SHORT TERM GOAL #3   Title  PT cervical extension to improve 15 degrees to allow pt to put her eyedrops in her eyes easier.    Time  4    Period  Weeks    Status  New      PT SHORT TERM GOAL #4   Title  PT cervical rotation to improve by 15 degrees to improve safety of driving.    Time  4    Period  Weeks        PT Long Term Goals - 12/18/18 1638      PT LONG TERM GOAL #1   Title  Pt  to have no radicular sx in her right arm and her cervical  pain to be no greater than a 2/10    Time  8    Period  Weeks    Status  New      PT LONG TERM GOAL #2   Title  PT to be I in self stretching exercises to decrease mm spasms in upper trap bilaterally to mild to assist in decreasing pain.    Time  8    Period  Weeks    Status  New       PT LONG TERM GOAL #3   Title  Pt cervical rotation to improve 30 degrees in both directions to improve safety of driving.    Time  8    Period  Weeks    Status  New      PT LONG TERM GOAL #4   Title  Cervical strength to be increased to no less than a 4/5 so that pt has no more headaches.    Time  8    Period  Weeks    Status  New            Plan - 12/30/18 1359    Clinical Impression Statement  Educated patient on use of towel roll down spine to decrease stress on neck when sitting in chair, patient tolerated this very well reporting it was very comfortable. Focused on gentle cervical mobilization today, tolerated moderately well. Joint and soft tissue mobilization (mainly SCM) made notable improvement in available cervical rotation as well as pain free cervical rotation. Patient would continue to benefit from mobilization of joints (continue to investigate and mobilize thoracic spine as tolerated) and postural strengthening exercises.    Personal Factors and Comorbidities  Comorbidity 3+;Past/Current Experience;Time since onset of injury/illness/exacerbation    Examination-Activity Limitations  Bathing;Lift;Other    Examination-Participation Restrictions  Church;Other    Stability/Clinical Decision Making  Evolving/Moderate complexity    Rehab Potential  Good    PT Frequency  3x / week    PT Duration  4 weeks    PT Treatment/Interventions  ADLs/Self Care Home Management;Patient/family education;Manual techniques;Dry needling;Therapeutic exercise;Therapeutic activities;Electrical Stimulation    PT Next Visit Plan  attempt thoracic extension down spine with towel roll, STM to SCM, cervical Paraspinals, lats, pecs. Tspine mobility -Continue cervical mobility and postural strengthening.  Continue cervical/thoracic excursion, axial extension.  Promote improved posture and mobility then stability.    PT Home Exercise Plan  scapular retraction, sitting tall, cervical excursion; 9/22:  decompression exercise and isometric; 9/28 chin tucks, shoulder extension with towel, towel roll down t-spine in sitting       Patient will benefit from skilled therapeutic intervention in order to improve the following deficits and impairments:  Decreased range of motion, Decreased strength, Increased fascial restricitons, Increased muscle spasms, Impaired perceived functional ability, Impaired sensation, Impaired UE functional use, Pain, Postural dysfunction  Visit Diagnosis: Radiculopathy, cervical region  Other muscle spasm     Problem List Patient Active Problem List   Diagnosis Date Noted  . Closed nondisplaced odontoid fracture with type II morphology ([place]) 01/27/2018  . Odontoid fracture ([place]) 01/24/2018  . Chest pain 11/14/2017  . Transaminitis 06/22/2016  . Collagenous colitis 05/23/2016  . Psoriasis 05/21/2016  . High risk medication use 02/15/2016  . Age-related osteoporosis without current pathological fracture 02/15/2016  . Trochanteric bursitis, left hip 02/15/2016  . Sacroiliitis, not elsewhere classified ([place]) 02/15/2016  . Chronic pain syndrome 02/15/2016  . Abnormal weight gain 06/25/2012  . Hypertension 07/03/2011  . Hypercholesteremia 07/03/2011  . Psoriatic arthritis ([place]) 07/03/2011  . Osteoarthritis of multiple joints 07/03/2011  . Esophageal  motility disorder 02/14/2011  . Cough 02/14/2011  . GERD 05/05/2010    4:05 PM, 12/30/18 Jerene Pitch, DPT Physical Therapy with [facility]  [phone] office  [place] [address] [place], [place], [address] Phone: [phone]   Fax:  [phone]  Name: Tonya Hall MRN: [id number] Date of Birth: [birth year]

## 2018-12-31 ENCOUNTER — Ambulatory Visit (HOSPITAL_COMMUNITY): Payer: PPO

## 2018-12-31 ENCOUNTER — Encounter (HOSPITAL_COMMUNITY): Payer: Self-pay

## 2018-12-31 DIAGNOSIS — M5412 Radiculopathy, cervical region: Secondary | ICD-10-CM

## 2018-12-31 DIAGNOSIS — M62838 Other muscle spasm: Secondary | ICD-10-CM

## 2018-12-31 NOTE — Progress Notes (Signed)
Office Visit Note  Patient: Tonya Hall             Date of Birth: 12-Jul-1950           MRN: [id number]             PCP: [name], MD Referring: [name], MD Visit Date: 01/14/2019 Occupation: @GUAROCC @  Subjective:  Left trochanteric bursitis    History of Present Illness: Tonya Hall is a 68 y.o. female with history of psoriatic arthritis, osteoarthritis, and DDD.  Patient is on Cosentyx 150 mg subcutaneous injections every 14 days and Arava 20 mg 1 tablet by mouth daily.  She has noticed significant improvement since spacing the dose of Cosentyx. She is taking long term prednisone 5 mg po daily. She states that about 2 days prior to her injection she experiences some increased discomfort and stiffness but denies any increased joint swelling.  She continues to have discomfort in the right third PIP joint and left second PIP and first DIP joint.  She states that she has been able to wear her rings for the first time in several years.  She has intermittent symptoms of plantar fasciitis but continues to walk barefoot on a regular basis.  She denies any Achilles tendinitis.  She has chronic pain in bilateral SI joints.  She denies any psoriasis at this time.  She presents today with left trochanter bursitis.  She requested a left trochanter bursa cortisone injection today.  She is currently undergoing physical therapy for neck pain and stiffness.  She has noticed a significant improvement in her range of motion and discomfort.  She is also having less frequent headaches.  She continues to take hydrocodone as needed for pain relief.  She states she typically only takes 1 to 2 tablets by mouth daily unless her pain is severe and she will take 3 tablets daily. She continues to receive IV Reclast infusions every year.  She states she is due for her next infusion in December 2020.   Activities of Daily Living:  Patient reports morning stiffness for several hours.   Patient Denies  nocturnal pain.  Difficulty dressing/grooming: Denies Difficulty climbing stairs: Reports Difficulty getting out of chair: Reports Difficulty using hands for taps, buttons, cutlery, and/or writing: Reports  Review of Systems  Constitutional: Positive for fatigue.  HENT: Positive for mouth dryness and nose dryness. Negative for mouth sores.   Eyes: Positive for dryness.  Respiratory: Negative for shortness of breath and difficulty breathing.   Cardiovascular: Negative for chest pain and palpitations.  Gastrointestinal: Negative for abdominal pain, blood in stool, constipation and diarrhea.  Endocrine: Negative for increased urination.  Genitourinary: Negative for difficulty urinating and painful urination.  Musculoskeletal: Positive for arthralgias, joint pain, joint swelling and morning stiffness.  Skin: Negative for rash and hair loss.  Allergic/Immunologic: Negative for susceptible to infections.  Neurological: Positive for headaches. Negative for dizziness, light-headedness, numbness, memory loss and weakness.  Hematological: Positive for bruising/bleeding tendency.  Psychiatric/Behavioral: Negative for confusion and sleep disturbance.    PMFS History:  Patient Active Problem List   Diagnosis Date Noted   Closed nondisplaced odontoid fracture with type II morphology ([place]) 01/27/2018   Odontoid fracture ([place]) 01/24/2018   Chest pain 11/14/2017   Transaminitis 06/22/2016   Collagenous colitis 05/23/2016   Psoriasis 05/21/2016   High risk medication use 02/15/2016   Age-related osteoporosis without current pathological fracture 02/15/2016   Trochanteric bursitis, left hip 02/15/2016   Sacroiliitis, not elsewhere  classified ([place]) 02/15/2016   Chronic pain syndrome 02/15/2016   Abnormal weight gain 06/25/2012   Hypertension 07/03/2011   Hypercholesteremia 07/03/2011   Psoriatic arthritis ([place]) 07/03/2011   Osteoarthritis of multiple joints 07/03/2011    Esophageal motility disorder 02/14/2011   Cough 02/14/2011   GERD 05/05/2010    Past Medical History:  Diagnosis Date   Asthma    Albuterol in haler prn   Cataract    immature unsure which eye   Chronic back pain    Degenerative disk disease    psoriatic   Diabetes mellitus without complication (HCC)    diet controlled   Esophageal motility disorder    Non-specific, see modified barium study/speech path, BP   GERD (gastroesophageal reflux disease)    takes Protonix bid   History of blood transfusion    post c-section   History of bronchitis    HTN (hypertension)    Hyperlipidemia    takes Pravastatin daily   Lymphocytic colitis 05/26/2010   Responded to Entocort x 3 MOS   Neuropathy    Osteoporosis    gets Boniva every 3 months   Peripheral edema    takes HCTZ daily   PONV (postoperative nausea and vomiting)    Psoriatic arthritis ([place])    Dr. Katherina Right   Raynaud's disease    Seasonal allergies    SUI (stress urinary incontinence, female)    Urinary urgency     Family History  Problem Relation Age of Onset   Diabetes Mother    Pancreatitis Mother    Arthritis Mother        psoriatic    Fibromyalgia Mother    Rheumatic fever Father    Diabetes Other        grandparents   Skin cancer Other        grandfather   Hypertension Other        grandparent   Congestive Heart Failure Other        grandfather   Parkinson's disease Other        grandmother   Colon cancer Maternal Uncle    Fibromyalgia Sister    Rheum arthritis Sister    Diabetes Sister    Fibromyalgia Sister    Diabetes Sister    Hypertension Son    Colon polyps Neg Hx    Past Surgical History:  Procedure Laterality Date   BIOPSY  04/17/2016   Procedure: BIOPSY;  Surgeon: Danie Binder, MD;  [facility];  Service: Endoscopy;;  random colon bx's   CERVICAL SPINE SURGERY  09/09/2017   CESAREAN SECTION  1974, 1978       COLONOSCOPY   06/19/2008   [id number] internal hemorrhoids/mild sigmoin colon diverticulosis   COLONOSCOPY  2012   Dr. Gala Romney: normal rectum, diverticula, lymphocytic colitis    COLONOSCOPY WITH PROPOFOL N/A 04/17/2016   Procedure: COLONOSCOPY WITH PROPOFOL;  Surgeon: Danie Binder, MD;  [facility];  Service: Endoscopy;  Laterality: N/A;  [place]   spontaneous abortion   ESOPHAGOGASTRODUODENOSCOPY  06/19/2008   [id number] gastritis   LUMBAR DISC ARTHROPLASTY  7/98   LUMBAR FUSION  6/03, 11/08, 11/14   L4-5 fusion, L2-3 fusion, L1-2   ODONTOID SCREW INSERTION N/A 01/27/2018   Procedure: ODONTOID SCREW INSERTION;  Surgeon: Newman Pies, MD;  Location: [facility];  Service: Neurosurgery;  Laterality: N/A;  ODONTOID SCREW INSERTION   TONSILLECTOMY AND ADENOIDECTOMY     TUBAL LIGATION  Social History   Social History Narrative   ATTENDS [employer]. 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   Immunization History  Administered Date(s) Administered   Influenza,inj,Quad PF,6+ Mos 01/21/2017   Influenza-Unspecified 01/30/2017   Pneumococcal Polysaccharide-23 02/19/2013   Tdap 09/10/2016     Objective: Vital Signs: BP (!) 141/81 (BP Location: Left Arm, Patient Position: Sitting, Cuff Size: Normal)    Pulse 91    Resp 12    Ht 4\' 11"  (1.499 m)    Wt 144 lb (65.3 kg)    LMP 02/01/1999    BMI 29.08 kg/m    Physical Exam Vitals signs and nursing note reviewed.  Constitutional:      Appearance: She is well-developed.  HENT:     Head: Normocephalic and atraumatic.  Eyes:     Conjunctiva/sclera: Conjunctivae normal.  Neck:     Musculoskeletal: Normal range of motion.  Cardiovascular:     Rate and Rhythm: Normal rate and regular rhythm.     Heart sounds: Normal heart sounds.  Pulmonary:     Effort: Pulmonary effort is normal.     Breath sounds: Normal breath sounds.  Abdominal:     General: Bowel sounds are normal.     Palpations:  Abdomen is soft.  Lymphadenopathy:     Cervical: No cervical adenopathy.  Skin:    General: Skin is warm and dry.     Capillary Refill: Capillary refill takes less than 2 seconds.  Neurological:     Mental Status: She is alert and oriented to person, place, and time.  Psychiatric:        Behavior: Behavior normal.      Musculoskeletal Exam: C-spine limited range of motion.  Thoracic kyphosis noted.  Limited range of motion lumbar spine.  No midline spinal tenderness.  She has bilateral SI joint tenderness.  Shoulder joints, elbow joints, and wrist joints good ROM with no discomfort.  No tenderness or synovitis of MCP joints. She has severe PIP and DIP synovial thickening and subluxation of several PIP and DIP joints.  She has synovitis of the right third PIP and left first DIP joint.  Hip joints have good range of motion with no discomfort.  She has tenderness over the left trochanteric bursa.  Knee joints have good range of motion with no warmth or effusion.  She has bilateral knee crepitus.  Ankle joints have good range of motion with no tenderness or inflammation.  Morton's neuroma is palpable bilaterally.  No tenderness of MTP or PIP joints.  No achilles tendonitis or plantar fasciitis.   CDAI Exam: CDAI Score: -- Patient Global: --; Provider Global: -- Swollen: 2 ; Tender: 4  Joint Exam      Right  Left  PIP 3  Swollen Tender     DIP 2     Swollen Tender  Sacroiliac   Tender   Tender     Investigation: No additional findings.  Imaging: No results found.  Recent Labs: Lab Results  Component Value Date   WBC 13.0 (H) 09/04/2018   HGB 12.4 09/04/2018   PLT 228 09/04/2018   NA 138 09/04/2018   K 4.2 09/04/2018   CL 105 09/04/2018   CO2 24 09/04/2018   GLUCOSE 124 (H) 09/04/2018   BUN 14 09/04/2018   CREATININE 0.92 09/04/2018   BILITOT 0.3 09/04/2018   ALKPHOS 53 02/28/2018   AST 23 09/04/2018   ALT 26 09/04/2018   PROT 6.3 09/04/2018   ALBUMIN 3.9 02/28/2018  CALCIUM 9.9 09/04/2018   GFRAA 74 09/04/2018   QFTBGOLD Negative 05/05/2015   QFTBGOLDPLUS NEGATIVE 09/04/2018    Speciality Comments: No specialty comments available.  Procedures:  Large Joint Inj: L greater trochanter on 01/14/2019 11:59 AM Indications: pain Details: 27 G 1.5 in needle, lateral approach  Arthrogram: No  Medications: 1 mL lidocaine 1 %; 40 mg triamcinolone acetonide 40 MG/ML Aspirate: 0 mL Outcome: tolerated well, no immediate complications Procedure, treatment alternatives, risks and benefits explained, specific risks discussed. Consent was given by the patient. Immediately prior to procedure a time out was called to verify the correct patient, procedure, equipment, support staff and site/side marked as required. Patient was prepped and draped in the usual sterile fashion.     Allergies: Adalimumab, Imuran [azathioprine sodium], Methotrexate, Plaquenil [hydroxychloroquine sulfate], Sulfonamide derivatives, Ceftin [cefuroxime axetil], and Morphine    Assessment / Plan:     Visit Diagnoses: Psoriatic arthritis ([place]): She has persistent synovitis in the right third PIP joint and left first DIP joint.  She has ongoing pain and stiffness in bilateral hands but overall her symptoms have been improving.  She has been able to wear her rings for the first time in several years.  She has noticed a significant improvement since spacing the dose of Cosentyx to 150 mg every 14 days and taking Arava 20 mg by mouth daily.  She is on long-term prednisone 5 mg by mouth daily.  She takes hydrocodone as needed for pain relief.  She continues to have ongoing symptoms of plantar fasciitis in both feet but walks barefoot on a daily basis.  Discussed importance of wearing proper fitting shoes.  She also has SI joint tenderness bilaterally but has been performing stretching exercises.  She will continue on his current treatment regimen.  She does not need any refills at this time.  She was  advised to notify us if she develops increased joint pain or joint swelling.  She will follow-up in the office in 5 months.  Psoriasis: She has no psoriasis at this time.  High risk medication use -Cosentyx 150 mg every 14 days and Arava 20 mg 1 tablet daily.  She is on long-term prednisone 5 mg by mouth daily.  Last TB gold negative on 09/04/2018 and will monitor yearly.  Most recent CBC/CMP within normal limits except elevated WBC on 09/04/2018.  Due for CBC/CMP today and will monitor every 3 months. - Plan: CBC with Differential/Platelet, COMPLETE METABOLIC PANEL WITH GFR  Chronic left SI joint pain: She has tenderness over the left SI joint on exam.  She experiences intermittent discomfort.  She has been performing stretching exercises on a regular basis.  Trochanteric bursitis, left hip: She presents today with left trochanteric bursitis.  She has tenderness on exam.  She has been experiencing symptoms for the past 3 weeks.  She requested a left trochanteric bursa cortisone injection.  The procedure note was completed above.  She was encouraged to perform stretching exercises on a regular basis.  Primary osteoarthritis involving multiple joints: She has chronic arthralgias.  She takes hydrocodone as needed for pain relief.  Morton's neuroma of both feet: She has tenderness in bilateral feet.  She walks barefoot on a regular basis.  She was encouraged to wear supportive shoes with a lot of cushioning.  She was advised to notify us if her symptoms persist or worsen and she can return for cortisone injection in the future.  Raynaud's syndrome without gangrene: She experiences intermittent symptoms of Raynaud's.  No digital ulcerations or signs of gangrene were noted.  She was encouraged to keep her core body temperature warm and wear thick gloves and socks.  DDD (degenerative disc disease), cervical: She has limited range of motion with some discomfort.  She is currently going to physical therapy.  Her  range of motion has improved significantly.  She has also had less frequent headaches.  She is not having any symptoms of radiculopathy at this time.  DDD (degenerative disc disease), thoracic: Thoracic kyphosis noted.  No midline spinal tenderness.  Age-related osteoporosis without current pathological fracture:  She is on Reclast IV yearly managed by Dr. Chalmers Cater.  Last infusion on 03/06/2018 which was her 5th infusion.  Chronic pain syndrome - Hydrocodone-UDS and narcotic agreement were updated today.  She typically takes hydrocodone 1 to 2 tablets by mouth daily as needed for pain relief.  If she is experiencing severe discomfort she takes 3 tablets of hydrocodone daily.  We discussed the importance of trying to taper her dose of hydrocodone.- Plan: Pain Mgmt, Profile 5 w/Conf, U  Medication monitoring encounter - UDS and narcotic agreement were updated today.  Plan: Pain Mgmt, Profile 5 w/Conf, U  History of gastroesophageal reflux (GERD)  History of type 2 diabetes mellitus  Collagenous colitis - followed by gastroenterologist   Esophageal motility disorder  Essential hypertension  Orders: Orders Placed This Encounter  Procedures   Large Joint Inj   Pain Mgmt, Profile 5 w/Conf, U   CBC with Differential/Platelet   COMPLETE METABOLIC PANEL WITH GFR   No orders of the defined types were placed in this encounter.   Face-to-face time spent with patient was 30  minutes. Greater than 50% of time was spent in counseling and coordination of care.  Follow-Up Instructions: Return in about 5 months (around 06/14/2019) for Psoriatic arthritis, Osteoarthritis, DDD.   Ofilia Neas, PA-C  Note - This record has been created using Dragon software.  Chart creation errors have been sought, but may not always  have been located. Such creation errors do not reflect on  the standard of medical care.

## 2018-12-31 NOTE — Therapy (Signed)
[place], [place], [address] Phone: [phone]   Fax:  [phone]  Physical Therapy Treatment  Patient Details  Name: Tonya Hall MRN: [id number] Date of Birth: [birth year] Referring Provider (PT): Frederich Cha    Encounter Date: 12/31/2018  PT End of Session - 12/31/18 1713    Visit Number  6    Number of Visits  20    Date for PT Re-Evaluation  01/17/19    Authorization Type  [employer]    Authorization Time Period  9/17-->02/17/19    Authorization - Visit Number  6    Authorization - Number of Visits  10    PT Start Time  1618    PT Stop Time  1703    PT Time Calculation (min)  45 min    Activity Tolerance  Patient tolerated treatment well    Behavior During Therapy  [facility] for tasks assessed/performed       Past Medical History:  Diagnosis Date  . Asthma    Albuterol in haler prn  . Cataract    immature unsure which eye  . Chronic back pain   . Degenerative disk disease    psoriatic  . Diabetes mellitus without complication (HCC)    diet controlled  . Esophageal motility disorder    Non-specific, see modified barium study/speech path, BP  . GERD (gastroesophageal reflux disease)    takes Protonix bid  . History of blood transfusion    post c-section  . History of bronchitis   . HTN (hypertension)   . Hyperlipidemia    takes Pravastatin daily  . Lymphocytic colitis 05/26/2010   Responded to Entocort x 3 MOS  . Neuropathy   . Osteoporosis    gets Boniva every 3 months  . Peripheral edema    takes HCTZ daily  . PONV (postoperative nausea and vomiting)   . Psoriatic arthritis ([place])    Dr. Katherina Right  . Raynaud's disease   . Seasonal allergies   . SUI (stress urinary incontinence, female)   . Urinary urgency     Past Surgical History:  Procedure Laterality Date  . BIOPSY  04/17/2016   Procedure: BIOPSY;  Surgeon: Danie Binder, MD;  [facility];  Service:  Endoscopy;;  random colon bx's  . CERVICAL SPINE SURGERY  09/09/2017  . CESAREAN SECTION  1974, 1978      . COLONOSCOPY  06/19/2008   [id number] internal hemorrhoids/mild sigmoin colon diverticulosis  . COLONOSCOPY  2012   Dr. Gala Romney: normal rectum, diverticula, lymphocytic colitis   . COLONOSCOPY WITH PROPOFOL N/A 04/17/2016   Procedure: COLONOSCOPY WITH PROPOFOL;  Surgeon: Danie Binder, MD;  [facility];  Service: Endoscopy;  Laterality: N/A;  900  . DILATION AND CURETTAGE OF UTERUS  1977   spontaneous abortion  . ESOPHAGOGASTRODUODENOSCOPY  06/19/2008   [id number] gastritis  . LUMBAR DISC ARTHROPLASTY  7/98  . LUMBAR FUSION  6/03, 11/08, 11/14   L4-5 fusion, L2-3 fusion, L1-2  . ODONTOID SCREW INSERTION N/A 01/27/2018   Procedure: ODONTOID SCREW INSERTION;  Surgeon: Newman Pies, MD;  Location: [facility];  Service: Neurosurgery;  Laterality: N/A;  ODONTOID SCREW INSERTION  . TONSILLECTOMY AND ADENOIDECTOMY    . TUBAL LIGATION      There were no vitals filed for this visit.  Subjective Assessment - 12/31/18 1630    Subjective  Pt stated her neck is feeling good.  Reports some nagging pain  Rt anterior shoulder and posterior, pain scale 2-3/10.    Pertinent History  psoriatic OA, 3 prior back surgeries, DM,    Patient Stated Goals  decreased headaches, improved ROM    Currently in Pain?  Yes    Pain Score  2     Pain Location  Shoulder    Pain Orientation  Right    Pain Descriptors / Indicators  Nagging    Pain Type  Chronic pain    Pain Radiating Towards  to hand several times a day    Pain Onset  More than a month ago    Pain Frequency  Intermittent                       [facility] Adult PT Treatment/Exercise - 12/31/18 0001      Posture/Postural Control   Posture/Postural Control  Postural limitations    Postural Limitations  Rounded Shoulders;Forward head;Decreased lumbar lordosis;Decreased thoracic kyphosis;Flexed trunk      Exercises    Exercises  Neck      Neck Exercises: Standing   Other Standing Exercises  Wall arch, Y- 10x 3"      Neck Exercises: Seated   W Back  15 reps    W Back Weights (lbs)  5" holds    Other Seated Exercise  3D cervical and thoracic excursion      Neck Exercises: Supine   Neck Retraction  5 secs;15 reps    Neck Retraction Limitations  Improving mechanics, tactile cueing    Other Supine Exercise  thoracic extension with towel rolled on spine x 2 minutes      Manual Therapy   Manual Therapy  Soft tissue mobilization    Manual therapy comments  Manual complete separate     Soft tissue mobilization  Supine with LE elevated, STM pecs, UT, levator scap, SCM, cervical paraspinals, suboccipital release      Neck Exercises: Stretches   Corner Stretch  2 reps;30 seconds               PT Short Term Goals - 12/18/18 1635      PT SHORT TERM GOAL #1   Title  PT Rt UE radiating pain to be no greater than a 4/10 and to go no further than the elbow to demonstrate decreased nerve root irritation.    Time  4    Period  Weeks    Status  New    Target Date  01/15/19      PT SHORT TERM GOAL #2   Title  PT to only be having headaches once a week    Time  4    Period  Weeks    Status  New      PT SHORT TERM GOAL #3   Title  PT cervical extension to improve 15 degrees to allow pt to put her eyedrops in her eyes easier.    Time  4    Period  Weeks    Status  New      PT SHORT TERM GOAL #4   Title  PT cervical rotation to improve by 15 degrees to improve safety of driving.    Time  4    Period  Weeks        PT Long Term Goals - 12/18/18 1638      PT LONG TERM GOAL #1   Title  Pt  to have no radicular sx in her right arm and her cervical  pain to be no greater than a 2/10    Time  8    Period  Weeks    Status  New      PT LONG TERM GOAL #2   Title  PT to be I in self stretching exercises to decrease mm spasms in upper trap bilaterally to mild to assist in decreasing pain.    Time   8    Period  Weeks    Status  New      PT LONG TERM GOAL #3   Title  Pt cervical rotation to improve 30 degrees in both directions to improve safety of driving.    Time  8    Period  Weeks    Status  New      PT LONG TERM GOAL #4   Title  Cervical strength to be increased to no less than a 4/5 so that pt has no more headaches.    Time  8    Period  Weeks    Status  New            Plan - 12/31/18 1713    Clinical Impression Statement  Pt arrived with reports of increased pain Rt pec/shoulder pain.  Session focus on cervical/thoracic mobility and postural strengthening.  Added pec stretches to assist with posture and pain control as well as towel roll for thoracic extension.  Pt tolerated well towards new exercises.  EOS with manual soft tissue mobilization to reduce tightness to assist with cervical mobility.  Vast improvements with rotation at EOS.  No reports of pain and reports reduction in headaches since began PT.    Personal Factors and Comorbidities  Comorbidity 3+;Past/Current Experience;Time since onset of injury/illness/exacerbation    Comorbidities  psoriatric arthritis, 3 back surgeries, 2 neck surgeries,    Examination-Activity Limitations  Bathing;Lift;Other    Examination-Participation Restrictions  Church;Other    Stability/Clinical Decision Making  Evolving/Moderate complexity    Clinical Decision Making  Moderate    Rehab Potential  Good    PT Frequency  3x / week    PT Duration  4 weeks    PT Treatment/Interventions  ADLs/Self Care Home Management;Patient/family education;Manual techniques;Dry needling;Therapeutic exercise;Therapeutic activities;Electrical Stimulation    PT Next Visit Plan  Assess seated axial extension next session.  Continue thoracic extension down spine with towel roll, STM to SCM, cervical Paraspinals, lats, pecs. Tspine mobility -Continue cervical mobility and postural strengthening.  Continue cervical/thoracic excursion, axial extension.   Promote improved posture and mobility then stability.    PT Home Exercise Plan  scapular retraction, sitting tall, cervical excursion; 9/22: decompression exercise and isometric; 9/28 chin tucks, shoulder extension with towel, towel roll down t-spine in sitting       Patient will benefit from skilled therapeutic intervention in order to improve the following deficits and impairments:  Decreased range of motion, Decreased strength, Increased fascial restricitons, Increased muscle spasms, Impaired perceived functional ability, Impaired sensation, Impaired UE functional use, Pain, Postural dysfunction  Visit Diagnosis: Other muscle spasm  Radiculopathy, cervical region     Problem List Patient Active Problem List   Diagnosis Date Noted  . Closed nondisplaced odontoid fracture with type II morphology ([place]) 01/27/2018  . Odontoid fracture ([place]) 01/24/2018  . Chest pain 11/14/2017  . Transaminitis 06/22/2016  . Collagenous colitis 05/23/2016  . Psoriasis 05/21/2016  . High risk medication use 02/15/2016  . Age-related osteoporosis without current pathological fracture 02/15/2016  . Trochanteric bursitis, left hip 02/15/2016  . Sacroiliitis,  not elsewhere classified ([place]) 02/15/2016  . Chronic pain syndrome 02/15/2016  . Abnormal weight gain 06/25/2012  . Hypertension 07/03/2011  . Hypercholesteremia 07/03/2011  . Psoriatic arthritis ([place]) 07/03/2011  . Osteoarthritis of multiple joints 07/03/2011  . Esophageal motility disorder 02/14/2011  . Cough 02/14/2011  . GERD 05/05/2010   Ihor Austin, [place]; [facility]  Aldona Lento 12/31/2018, 5:21 PM  [place] [address] [place], [place], [address] Phone: [phone]   Fax:  [phone]  Name: Tonya Hall MRN: [id number] Date of Birth: 02-17-1951

## 2019-01-01 ENCOUNTER — Other Ambulatory Visit: Payer: Self-pay

## 2019-01-01 ENCOUNTER — Encounter (HOSPITAL_COMMUNITY): Payer: Self-pay | Admitting: Physical Therapy

## 2019-01-01 ENCOUNTER — Ambulatory Visit (HOSPITAL_COMMUNITY): Payer: PPO | Attending: Neurosurgery | Admitting: Physical Therapy

## 2019-01-01 DIAGNOSIS — M5412 Radiculopathy, cervical region: Secondary | ICD-10-CM | POA: Insufficient documentation

## 2019-01-01 DIAGNOSIS — M62838 Other muscle spasm: Secondary | ICD-10-CM

## 2019-01-01 NOTE — Therapy (Signed)
[place], [place], [address] Phone: [phone]   Fax:  [phone]  Physical Therapy Treatment  Patient Details  Name: Tonya Hall MRN: [id number] Date of Birth: [birth year] Referring Provider (PT): Frederich Cha    Encounter Date: 01/01/2019  PT End of Session - 01/01/19 1318    Visit Number  7    Number of Visits  20    Date for PT Re-Evaluation  01/17/19    Authorization Type  [employer]    Authorization Time Period  9/17-->02/17/19    Authorization - Visit Number  7    Authorization - Number of Visits  10    PT Start Time  1318    PT Stop Time  1400    PT Time Calculation (min)  42 min    Activity Tolerance  Patient tolerated treatment well    Behavior During Therapy  [facility] for tasks assessed/performed       Past Medical History:  Diagnosis Date  . Asthma    Albuterol in haler prn  . Cataract    immature unsure which eye  . Chronic back pain   . Degenerative disk disease    psoriatic  . Diabetes mellitus without complication (HCC)    diet controlled  . Esophageal motility disorder    Non-specific, see modified barium study/speech path, BP  . GERD (gastroesophageal reflux disease)    takes Protonix bid  . History of blood transfusion    post c-section  . History of bronchitis   . HTN (hypertension)   . Hyperlipidemia    takes Pravastatin daily  . Lymphocytic colitis 05/26/2010   Responded to Entocort x 3 MOS  . Neuropathy   . Osteoporosis    gets Boniva every 3 months  . Peripheral edema    takes HCTZ daily  . PONV (postoperative nausea and vomiting)   . Psoriatic arthritis ([place])    Dr. Katherina Right  . Raynaud's disease   . Seasonal allergies   . SUI (stress urinary incontinence, female)   . Urinary urgency     Past Surgical History:  Procedure Laterality Date  . BIOPSY  04/17/2016   Procedure: BIOPSY;  Surgeon: Danie Binder, MD;  [facility];  Service:  Endoscopy;;  random colon bx's  . CERVICAL SPINE SURGERY  09/09/2017  . CESAREAN SECTION  1974, 1978      . COLONOSCOPY  06/19/2008   [id number] internal hemorrhoids/mild sigmoin colon diverticulosis  . COLONOSCOPY  2012   Dr. Gala Romney: normal rectum, diverticula, lymphocytic colitis   . COLONOSCOPY WITH PROPOFOL N/A 04/17/2016   Procedure: COLONOSCOPY WITH PROPOFOL;  Surgeon: Danie Binder, MD;  [facility];  Service: Endoscopy;  Laterality: N/A;  900  . DILATION AND CURETTAGE OF UTERUS  1977   spontaneous abortion  . ESOPHAGOGASTRODUODENOSCOPY  06/19/2008   [id number] gastritis  . LUMBAR DISC ARTHROPLASTY  7/98  . LUMBAR FUSION  6/03, 11/08, 11/14   L4-5 fusion, L2-3 fusion, L1-2  . ODONTOID SCREW INSERTION N/A 01/27/2018   Procedure: ODONTOID SCREW INSERTION;  Surgeon: Newman Pies, MD;  Location: [facility];  Service: Neurosurgery;  Laterality: N/A;  ODONTOID SCREW INSERTION  . TONSILLECTOMY AND ADENOIDECTOMY    . TUBAL LIGATION      There were no vitals filed for this visit.  Subjective Assessment - 01/01/19 1317    Subjective  Patient reports her cervical motion is still better after her session a  few visits ago. Reports that her right shoulder has been bothering her since then though and now she feels like she can't actively reach up behind her back as much as she was doing. Reports the pain in her arm runs down the back side.    Pertinent History  psoriatic OA, 3 prior back surgeries, DM,    Patient Stated Goals  decreased headaches, improved ROM    Currently in Pain?  Yes    Pain Score  2     Pain Location  Arm    Pain Orientation  Right;Upper;Proximal    Pain Descriptors / Indicators  Aching;Nagging    Pain Onset  More than a month ago                       [facility] Adult PT Treatment/Exercise - 01/01/19 0001      Neck Exercises: Standing   Other Standing Exercises  shoulder IR stretch with towel x5, 10" holds R    Other Standing Exercises   towel roll down back with scap retraction 2x12, 5" holds      Neck Exercises: Supine   Other Supine Exercise  shoulder flexion x15 R with scapular retraction       Manual Therapy   Manual Therapy  Joint mobilization;Soft tissue mobilization    Manual therapy comments  Manual completed separate of other interventions    Joint Mobilization  prone: CPA to T2-8 grade II , lateral glide to T3-6 grade II, UPA to bilat ribs 3-6 grade II     Soft tissue mobilization  supine: R Lat STM - tolerated well, prone: STM to rhomboids, thoracic paraspinals and lower trap             PT Education - 01/01/19 1544    Education Details  on typically feeling of soreness after session, how muscles can "rebound" if something is done for the first time. Educated patient on the mechanics of the shoulder joint.    Person(s) Educated  Patient    Methods  Explanation    Comprehension  Verbalized understanding       PT Short Term Goals - 12/18/18 1635      PT SHORT TERM GOAL #1   Title  PT Rt UE radiating pain to be no greater than a 4/10 and to go no further than the elbow to demonstrate decreased nerve root irritation.    Time  4    Period  Weeks    Status  New    Target Date  01/15/19      PT SHORT TERM GOAL #2   Title  PT to only be having headaches once a week    Time  4    Period  Weeks    Status  New      PT SHORT TERM GOAL #3   Title  PT cervical extension to improve 15 degrees to allow pt to put her eyedrops in her eyes easier.    Time  4    Period  Weeks    Status  New      PT SHORT TERM GOAL #4   Title  PT cervical rotation to improve by 15 degrees to improve safety of driving.    Time  4    Period  Weeks        PT Long Term Goals - 12/18/18 1638      PT LONG TERM GOAL #1   Title  Pt  to  have no radicular sx in her right arm and her cervical  pain to be no greater than a 2/10    Time  8    Period  Weeks    Status  New      PT LONG TERM GOAL #2   Title  PT to be I in self  stretching exercises to decrease mm spasms in upper trap bilaterally to mild to assist in decreasing pain.    Time  8    Period  Weeks    Status  New      PT LONG TERM GOAL #3   Title  Pt cervical rotation to improve 30 degrees in both directions to improve safety of driving.    Time  8    Period  Weeks    Status  New      PT LONG TERM GOAL #4   Title  Cervical strength to be increased to no less than a 4/5 so that pt has no more headaches.    Time  8    Period  Weeks    Status  New            Plan - 01/01/19 1545    Clinical Impression Statement  Session focused on shoulder per patient complaint of recent complaint and "golf ball feeling in her axial which has been present forever." Able to abolish these symptoms with gentle STM to lat. Worked on shoulder IR secondary to complain of minimal movement reaching behind back. Transitioned to thoracic extension via mobilization to improve thoracic mobility and decrease stress on cervical spine. Light pressure and grade II mobilizations were tolerated well. Soreness in back was noted end of session but no pain, and no pain reported down right UE which was present at start of session. Patient would continue to benefit from skilled physical therapy to improve QOL.    Personal Factors and Comorbidities  Comorbidity 3+;Past/Current Experience;Time since onset of injury/illness/exacerbation    Comorbidities  psoriatric arthritis, 3 back surgeries, 2 neck surgeries,    Examination-Activity Limitations  Bathing;Lift;Other    Examination-Participation Restrictions  Church;Other    Stability/Clinical Decision Making  Evolving/Moderate complexity    Rehab Potential  Good    PT Frequency  3x / week    PT Duration  4 weeks    PT Treatment/Interventions  ADLs/Self Care Home Management;Patient/family education;Manual techniques;Dry needling;Therapeutic exercise;Therapeutic activities;Electrical Stimulation    PT Next Visit Plan  Continue thoracic  extension down spine with towel roll, STM to SCM, cervical Paraspinals, lats, pecs. Tspine mobility -Continue cervical mobility and postural strengthening.  Continue cervical/thoracic excursion, axial extension.  Promote improved posture and mobility then stability.    PT Home Exercise Plan  scapular retraction, sitting tall, cervical excursion; 9/22: decompression exercise and isometric; 9/28 chin tucks, shoulder extension with towel, towel roll down t-spine in sitting and paired with scap retraction    Consulted and Agree with Plan of Care  Patient       Patient will benefit from skilled therapeutic intervention in order to improve the following deficits and impairments:  Decreased range of motion, Decreased strength, Increased fascial restricitons, Increased muscle spasms, Impaired perceived functional ability, Impaired sensation, Impaired UE functional use, Pain, Postural dysfunction  Visit Diagnosis: Other muscle spasm  Radiculopathy, cervical region     Problem List Patient Active Problem List   Diagnosis Date Noted  . Closed nondisplaced odontoid fracture with type II morphology ([place]) 01/27/2018  . Odontoid fracture ([place]) 01/24/2018  . Chest  pain 11/14/2017  . Transaminitis 06/22/2016  . Collagenous colitis 05/23/2016  . Psoriasis 05/21/2016  . High risk medication use 02/15/2016  . Age-related osteoporosis without current pathological fracture 02/15/2016  . Trochanteric bursitis, left hip 02/15/2016  . Sacroiliitis, not elsewhere classified ([place]) 02/15/2016  . Chronic pain syndrome 02/15/2016  . Abnormal weight gain 06/25/2012  . Hypertension 07/03/2011  . Hypercholesteremia 07/03/2011  . Psoriatic arthritis ([place]) 07/03/2011  . Osteoarthritis of multiple joints 07/03/2011  . Esophageal motility disorder 02/14/2011  . Cough 02/14/2011  . GERD 05/05/2010   3:54 PM, 01/01/19 Jerene Pitch, DPT Physical Therapy with [facility]  [phone]  office  [place] [address] [place], [place], [address] Phone: [phone]   Fax:  [phone]  Name: Tonya Hall MRN: [id number] Date of Birth: 05-Jun-1950

## 2019-01-05 ENCOUNTER — Telehealth (HOSPITAL_COMMUNITY): Payer: Self-pay | Admitting: Physical Therapy

## 2019-01-05 ENCOUNTER — Ambulatory Visit (HOSPITAL_COMMUNITY): Payer: PPO | Admitting: Physical Therapy

## 2019-01-05 NOTE — Telephone Encounter (Signed)
pt cancelled due to she is having a flare up of her si joint.

## 2019-01-06 ENCOUNTER — Other Ambulatory Visit: Payer: Self-pay

## 2019-01-06 ENCOUNTER — Encounter (HOSPITAL_COMMUNITY): Payer: Self-pay

## 2019-01-06 ENCOUNTER — Ambulatory Visit (HOSPITAL_COMMUNITY): Payer: PPO

## 2019-01-06 DIAGNOSIS — M62838 Other muscle spasm: Secondary | ICD-10-CM | POA: Diagnosis not present

## 2019-01-06 DIAGNOSIS — M5412 Radiculopathy, cervical region: Secondary | ICD-10-CM

## 2019-01-06 NOTE — Therapy (Signed)
[place], [place], [address] Phone: [phone]   Fax:  [phone]  Physical Therapy Treatment  Patient Details  Name: Tonya Hall MRN: [id number] Date of Birth: 09/09/1950 Referring Provider (PT): Frederich Cha    Encounter Date: 01/06/2019  PT End of Session - 01/06/19 1239    Visit Number  8    Number of Visits  20    Date for PT Re-Evaluation  01/17/19    Authorization Type  [employer]    Authorization Time Period  9/17-->02/17/19    Authorization - Visit Number  8    Authorization - Number of Visits  10    PT Start Time  [id number]    PT Stop Time  [id number]    PT Time Calculation (min)  43 min    Activity Tolerance  Patient tolerated treatment well;No increased pain    Behavior During Therapy  WFL for tasks assessed/performed       Past Medical History:  Diagnosis Date  . Asthma    Albuterol in haler prn  . Cataract    immature unsure which eye  . Chronic back pain   . Degenerative disk disease    psoriatic  . Diabetes mellitus without complication (HCC)    diet controlled  . Esophageal motility disorder    Non-specific, see modified barium study/speech path, BP  . GERD (gastroesophageal reflux disease)    takes Protonix bid  . History of blood transfusion    post c-section  . History of bronchitis   . HTN (hypertension)   . Hyperlipidemia    takes Pravastatin daily  . Lymphocytic colitis 05/26/2010   Responded to Entocort x 3 MOS  . Neuropathy   . Osteoporosis    gets Boniva every 3 months  . Peripheral edema    takes HCTZ daily  . PONV (postoperative nausea and vomiting)   . Psoriatic arthritis ([place])    Dr. Katherina Right  . Raynaud's disease   . Seasonal allergies   . SUI (stress urinary incontinence, female)   . Urinary urgency     Past Surgical History:  Procedure Laterality Date  . BIOPSY  04/17/2016   Procedure: BIOPSY;  Surgeon: Danie Binder, MD;  [facility];  Service: Endoscopy;;  random colon bx's  . CERVICAL SPINE SURGERY  09/09/2017  . CESAREAN SECTION  1974, 1978      . COLONOSCOPY  06/19/2008   [id number] internal hemorrhoids/mild sigmoin colon diverticulosis  . COLONOSCOPY  2012   Dr. Gala Romney: normal rectum, diverticula, lymphocytic colitis   . COLONOSCOPY WITH PROPOFOL N/A 04/17/2016   Procedure: COLONOSCOPY WITH PROPOFOL;  Surgeon: Danie Binder, MD;  [facility];  Service: Endoscopy;  Laterality: N/A;  900  . DILATION AND CURETTAGE OF UTERUS  1977   spontaneous abortion  . ESOPHAGOGASTRODUODENOSCOPY  06/19/2008   [id number] gastritis  . LUMBAR DISC ARTHROPLASTY  7/98  . LUMBAR FUSION  6/03, 11/08, 11/14   L4-5 fusion, L2-3 fusion, L1-2  . ODONTOID SCREW INSERTION N/A 01/27/2018   Procedure: ODONTOID SCREW INSERTION;  Surgeon: Newman Pies, MD;  Location: [facility];  Service: Neurosurgery;  Laterality: N/A;  ODONTOID SCREW INSERTION  . TONSILLECTOMY AND ADENOIDECTOMY    . TUBAL LIGATION      There were no vitals filed for this visit.  Subjective Assessment - 01/06/19 1134    Subjective  Pt stated her neck is feeling good today, does c/o  Bil hand and LBP yesterday doing a lot of sewing making curtains.  Reports Rt shoulder pain has resolved and improved IR as well as cervical rotation, increased ease with turns while driving.    Pertinent History  psoriatic OA, 3 prior back surgeries, DM,    Currently in Pain?  Yes    Pain Score  4     Pain Location  Back    Pain Orientation  Lower    Pain Descriptors / Indicators  Aching;Nagging                       [facility] Adult PT Treatment/Exercise - 01/06/19 0001      Posture/Postural Control   Posture/Postural Control  Postural limitations    Postural Limitations  Rounded Shoulders;Forward head;Decreased lumbar lordosis;Decreased thoracic kyphosis;Flexed trunk      Exercises   Exercises  Neck      Neck Exercises: Seated   Neck Retraction  10  reps;3 secs    X to V  15 reps    W Back  15 reps    W Back Weights (lbs)  5" holds    Postural Training  Sitting tall, 4in step LE support (height)    Other Seated Exercise  3D thoracic excursion      Neck Exercises: Standing   Other Supine Exercise  towel roll, cervical retraction isometric and scapular retractions 10    Other Supine Exercise  shoulder flexion x15 with scapular retraction       Manual Therapy   Manual Therapy  Soft tissue mobilization    Manual therapy comments  Manual completed separate of other interventions    Soft tissue mobilization  Prone: lats and periscapular; supine UT, pects and SCM      Neck Exercises: Stretches   Other Neck Stretches  POE x 1 min               PT Short Term Goals - 12/18/18 1635      PT SHORT TERM GOAL #1   Title  PT Rt UE radiating pain to be no greater than a 4/10 and to go no further than the elbow to demonstrate decreased nerve root irritation.    Time  4    Period  Weeks    Status  New    Target Date  01/15/19      PT SHORT TERM GOAL #2   Title  PT to only be having headaches once a week    Time  4    Period  Weeks    Status  New      PT SHORT TERM GOAL #3   Title  PT cervical extension to improve 15 degrees to allow pt to put her eyedrops in her eyes easier.    Time  4    Period  Weeks    Status  New      PT SHORT TERM GOAL #4   Title  PT cervical rotation to improve by 15 degrees to improve safety of driving.    Time  4    Period  Weeks        PT Long Term Goals - 12/18/18 1638      PT LONG TERM GOAL #1   Title  Pt  to have no radicular sx in her right arm and her cervical  pain to be no greater than a 2/10    Time  8    Period  Weeks  Status  New      PT LONG TERM GOAL #2   Title  PT to be I in self stretching exercises to decrease mm spasms in upper trap bilaterally to mild to assist in decreasing pain.    Time  8    Period  Weeks    Status  New      PT LONG TERM GOAL #3   Title  Pt  cervical rotation to improve 30 degrees in both directions to improve safety of driving.    Time  8    Period  Weeks    Status  New      PT LONG TERM GOAL #4   Title  Cervical strength to be increased to no less than a 4/5 so that pt has no more headaches.    Time  8    Period  Weeks    Status  New            Plan - 01/06/19 1250    Clinical Impression Statement  Pt arrived with vast improvements with cervical mobility and no reports of Rt shoulder pain, improved IR ROM.  Session focus on thoracic extension and postural strengthening.  Added POE wiht good tolerance for thoracic extension and axial extension.  EOS with manual to address restricitons in SCM, pecs, upper and mid traps, no spasms noted with lats today.  EOS pt reports improvements with LBP reduced to 2/10 and no reoprts of cervical pain thorugh session.    Personal Factors and Comorbidities  Comorbidity 3+;Past/Current Experience;Time since onset of injury/illness/exacerbation    Comorbidities  psoriatric arthritis, 3 back surgeries, 2 neck surgeries,    Examination-Activity Limitations  Bathing;Lift;Other    Examination-Participation Restrictions  Church;Other    Stability/Clinical Decision Making  Evolving/Moderate complexity    Clinical Decision Making  Moderate    Rehab Potential  Good    PT Frequency  3x / week    PT Duration  4 weeks    PT Treatment/Interventions  ADLs/Self Care Home Management;Patient/family education;Manual techniques;Dry needling;Therapeutic exercise;Therapeutic activities;Electrical Stimulation    PT Next Visit Plan  Trial wiht increased timing POE (good tolerance with 1 minute)  Continue thoracic extension down spine with towel roll, STM to SCM, cervical Paraspinals, lats, pecs. Tspine mobility -Continue cervical mobility and postural strengthening.  Continue cervical/thoracic excursion, axial extension.  Promote improved posture and mobility then stability.    PT Home Exercise Plan  scapular  retraction, sitting tall, cervical excursion; 9/22: decompression exercise and isometric; 9/28 chin tucks, shoulder extension with towel, towel roll down t-spine in sitting and paired with scap retraction       Patient will benefit from skilled therapeutic intervention in order to improve the following deficits and impairments:  Decreased range of motion, Decreased strength, Increased fascial restricitons, Increased muscle spasms, Impaired perceived functional ability, Impaired sensation, Impaired UE functional use, Pain, Postural dysfunction  Visit Diagnosis: Other muscle spasm  Radiculopathy, cervical region     Problem List Patient Active Problem List   Diagnosis Date Noted  . Closed nondisplaced odontoid fracture with type II morphology ([place]) 01/27/2018  . Odontoid fracture ([place]) 01/24/2018  . Chest pain 11/14/2017  . Transaminitis 06/22/2016  . Collagenous colitis 05/23/2016  . Psoriasis 05/21/2016  . High risk medication use 02/15/2016  . Age-related osteoporosis without current pathological fracture 02/15/2016  . Trochanteric bursitis, left hip 02/15/2016  . Sacroiliitis, not elsewhere classified ([place]) 02/15/2016  . Chronic pain syndrome 02/15/2016  . Abnormal weight gain 06/25/2012  .  Hypertension 07/03/2011  . Hypercholesteremia 07/03/2011  . Psoriatic arthritis ([place]) 07/03/2011  . Osteoarthritis of multiple joints 07/03/2011  . Esophageal motility disorder 02/14/2011  . Cough 02/14/2011  . GERD 05/05/2010   Ihor Austin, [place]; [place]  Aldona Lento 01/06/2019, 12:58 PM  [facility] [address] [place], [place], [address] Phone: [phone]   Fax:  [phone]  Name: Tonya Hall MRN: [id number] Date of Birth: [birth year]

## 2019-01-08 ENCOUNTER — Encounter (HOSPITAL_COMMUNITY): Payer: PPO

## 2019-01-08 ENCOUNTER — Telehealth: Payer: Self-pay | Admitting: Rheumatology

## 2019-01-08 MED ORDER — HYDROCODONE-ACETAMINOPHEN 5-325 MG PO TABS: ORAL_TABLET | ORAL | 0 refills | Status: DC

## 2019-01-08 NOTE — Telephone Encounter (Signed)
Last Visit: 10/21/18 Next Visit: 01/22/19  UDS: 09/04/18 Narc Agreement: 09/04/18   Patient to update UDS and narc agreement on 01/22/19  Okay to refill Hydrocodone?

## 2019-01-08 NOTE — Telephone Encounter (Signed)
Patient called requesting prescription refill of Hydrocodone to be sent to [facility] in [place]. 

## 2019-01-09 ENCOUNTER — Ambulatory Visit (HOSPITAL_COMMUNITY): Payer: PPO | Admitting: Physical Therapy

## 2019-01-09 ENCOUNTER — Encounter (HOSPITAL_COMMUNITY): Payer: Self-pay | Admitting: Physical Therapy

## 2019-01-09 ENCOUNTER — Other Ambulatory Visit: Payer: Self-pay

## 2019-01-09 DIAGNOSIS — M62838 Other muscle spasm: Secondary | ICD-10-CM

## 2019-01-09 DIAGNOSIS — M5412 Radiculopathy, cervical region: Secondary | ICD-10-CM

## 2019-01-09 NOTE — Therapy (Signed)
[facility] [place], [place], [address] Phone: [phone]   Fax:  [phone]  Physical Therapy Treatment  Patient Details  Name: Tonya Hall MRN: [id number] Date of Birth: 1951/01/26 Referring Provider (PT): Frederich Cha    Encounter Date: 01/09/2019  PT End of Session - 01/09/19 0932    Visit Number  9    Number of Visits  20    Date for PT Re-Evaluation  01/17/19    Authorization Type  [employer]    Authorization Time Period  9/17-->02/17/19    Authorization - Visit Number  9    Authorization - Number of Visits  10    PT Start Time  0900    PT Stop Time  0940    PT Time Calculation (min)  40 min    Activity Tolerance  Patient tolerated treatment well;No increased pain    Behavior During Therapy  WFL for tasks assessed/performed       Past Medical History:  Diagnosis Date  . Asthma    Albuterol in haler prn  . Cataract    immature unsure which eye  . Chronic back pain   . Degenerative disk disease    psoriatic  . Diabetes mellitus without complication (HCC)    diet controlled  . Esophageal motility disorder    Non-specific, see modified barium study/speech path, BP  . GERD (gastroesophageal reflux disease)    takes Protonix bid  . History of blood transfusion    post c-section  . History of bronchitis   . HTN (hypertension)   . Hyperlipidemia    takes Pravastatin daily  . Lymphocytic colitis 05/26/2010   Responded to Entocort x 3 MOS  . Neuropathy   . Osteoporosis    gets Boniva every 3 months  . Peripheral edema    takes HCTZ daily  . PONV (postoperative nausea and vomiting)   . Psoriatic arthritis ([place])    Dr. Katherina Right  . Raynaud's disease   . Seasonal allergies   . SUI (stress urinary incontinence, female)   . Urinary urgency     Past Surgical History:  Procedure Laterality Date  . BIOPSY  04/17/2016   Procedure: BIOPSY;  Surgeon: Danie Binder, MD;  [facility];  Service: Endoscopy;;  random colon bx's  . CERVICAL SPINE SURGERY  09/09/2017  . CESAREAN SECTION  1974, 1978      . COLONOSCOPY  06/19/2008   [id number] internal hemorrhoids/mild sigmoin colon diverticulosis  . COLONOSCOPY  2012   Dr. Gala Romney: normal rectum, diverticula, lymphocytic colitis   . COLONOSCOPY WITH PROPOFOL N/A 04/17/2016   Procedure: COLONOSCOPY WITH PROPOFOL;  Surgeon: Danie Binder, MD;  [facility];  Service: Endoscopy;  Laterality: N/A;  900  . DILATION AND CURETTAGE OF UTERUS  1977   spontaneous abortion  . ESOPHAGOGASTRODUODENOSCOPY  06/19/2008   [id number] gastritis  . LUMBAR DISC ARTHROPLASTY  7/98  . LUMBAR FUSION  6/03, 11/08, 11/14   L4-5 fusion, L2-3 fusion, L1-2  . ODONTOID SCREW INSERTION N/A 01/27/2018   Procedure: ODONTOID SCREW INSERTION;  Surgeon: Newman Pies, MD;  Location: [facility];  Service: Neurosurgery;  Laterality: N/A;  ODONTOID SCREW INSERTION  . TONSILLECTOMY AND ADENOIDECTOMY    . TUBAL LIGATION      There were no vitals filed for this visit.  Subjective Assessment - 01/09/19 0903    Subjective  Patient says that she has been doing well with therapy,  but notes that her neck was hurting yesterday. Patient says she is not sure why but thinks maybe she slept wrong. Patient says her back hurts today as well, but says that it is no different than usual.    Pertinent History  psoriatic OA, 3 prior back surgeries, DM,    Currently in Pain?  Yes    Pain Score  1     Pain Location  Neck    Pain Orientation  Left    Pain Descriptors / Indicators  Aching                       [facility] Adult PT Treatment/Exercise - 01/09/19 0001      Neck Exercises: Seated   Neck Retraction  10 reps;3 secs    W Back  15 reps    W Back Weights (lbs)  5" holds      Neck Exercises: Supine   Other Supine Exercise  scapular retractions 10 x 3"      Manual Therapy   Manual Therapy  Soft tissue mobilization    Manual therapy  comments  Manual completed separate of other interventions    Soft tissue mobilization  Prone: bilateral lats and periscapular; supine UT, and SCM      Neck Exercises: Stretches   Other Neck Stretches  POE x 2 min               PT Short Term Goals - 12/18/18 1635      PT SHORT TERM GOAL #1   Title  PT Rt UE radiating pain to be no greater than a 4/10 and to go no further than the elbow to demonstrate decreased nerve root irritation.    Time  4    Period  Weeks    Status  New    Target Date  01/15/19      PT SHORT TERM GOAL #2   Title  PT to only be having headaches once a week    Time  4    Period  Weeks    Status  New      PT SHORT TERM GOAL #3   Title  PT cervical extension to improve 15 degrees to allow pt to put her eyedrops in her eyes easier.    Time  4    Period  Weeks    Status  New      PT SHORT TERM GOAL #4   Title  PT cervical rotation to improve by 15 degrees to improve safety of driving.    Time  4    Period  Weeks        PT Long Term Goals - 12/18/18 1638      PT LONG TERM GOAL #1   Title  Pt  to have no radicular sx in her right arm and her cervical  pain to be no greater than a 2/10    Time  8    Period  Weeks    Status  New      PT LONG TERM GOAL #2   Title  PT to be I in self stretching exercises to decrease mm spasms in upper trap bilaterally to mild to assist in decreasing pain.    Time  8    Period  Weeks    Status  New      PT LONG TERM GOAL #3   Title  Pt cervical rotation to improve 30 degrees in both directions to  improve safety of driving.    Time  8    Period  Weeks    Status  New      PT LONG TERM GOAL #4   Title  Cervical strength to be increased to no less than a 4/5 so that pt has no more headaches.    Time  8    Period  Weeks    Status  New            Plan - 01/09/19 0933    Clinical Impression Statement  Patient tolerated session well today. Iniated session with manual therapy, and more time spent with  manual treatment this visit due to subjective complaint of increased neck and shoulder pain. Patient with increased muscle tension about LT SCM, and LT periscapular muscles today. Muslce tension, and pain level was reduced post manual treatment. Trialed extended POE to 2 minutes today, patient noted increased pain in lumbar with added duration.    Personal Factors and Comorbidities  Comorbidity 3+;Past/Current Experience;Time since onset of injury/illness/exacerbation    Comorbidities  psoriatric arthritis, 3 back surgeries, 2 neck surgeries,    Examination-Activity Limitations  Bathing;Lift;Other    Examination-Participation Restrictions  Church;Other    Stability/Clinical Decision Making  Evolving/Moderate complexity    Rehab Potential  Good    PT Frequency  3x / week    PT Duration  4 weeks    PT Treatment/Interventions  ADLs/Self Care Home Management;Patient/family education;Manual techniques;Dry needling;Therapeutic exercise;Therapeutic activities;Electrical Stimulation    PT Next Visit Plan  Continue to progress cervical and thoracic mobility exercise as tolerated. Continue to address muscle restrictions with targeted manual therapy to reduce pain and improve functional outcomes.    PT Home Exercise Plan  scapular retraction, sitting tall, cervical excursion; 9/22: decompression exercise and isometric; 9/28 chin tucks, shoulder extension with towel, towel roll down t-spine in sitting and paired with scap retraction       Patient will benefit from skilled therapeutic intervention in order to improve the following deficits and impairments:  Decreased range of motion, Decreased strength, Increased fascial restricitons, Increased muscle spasms, Impaired perceived functional ability, Impaired sensation, Impaired UE functional use, Pain, Postural dysfunction  Visit Diagnosis: Other muscle spasm  Radiculopathy, cervical region     Problem List Patient Active Problem List   Diagnosis Date  Noted  . Closed nondisplaced odontoid fracture with type II morphology ([place]) 01/27/2018  . Odontoid fracture ([place]) 01/24/2018  . Chest pain 11/14/2017  . Transaminitis 06/22/2016  . Collagenous colitis 05/23/2016  . Psoriasis 05/21/2016  . High risk medication use 02/15/2016  . Age-related osteoporosis without current pathological fracture 02/15/2016  . Trochanteric bursitis, left hip 02/15/2016  . Sacroiliitis, not elsewhere classified ([place]) 02/15/2016  . Chronic pain syndrome 02/15/2016  . Abnormal weight gain 06/25/2012  . Hypertension 07/03/2011  . Hypercholesteremia 07/03/2011  . Psoriatic arthritis ([place]) 07/03/2011  . Osteoarthritis of multiple joints 07/03/2011  . Esophageal motility disorder 02/14/2011  . Cough 02/14/2011  . GERD 05/05/2010    Elizbeth Squires PT DPT 01/09/2019, 1:22 PM   [facility] [address] [place], [place], [address] Phone: [phone]   Fax:  [phone]  Name: LATIMA COMMON MRN: [id number] Date of Birth: 1951/02/01

## 2019-01-12 ENCOUNTER — Ambulatory Visit (HOSPITAL_COMMUNITY): Payer: PPO | Admitting: Physical Therapy

## 2019-01-12 ENCOUNTER — Other Ambulatory Visit: Payer: Self-pay

## 2019-01-12 DIAGNOSIS — M62838 Other muscle spasm: Secondary | ICD-10-CM

## 2019-01-12 DIAGNOSIS — M5412 Radiculopathy, cervical region: Secondary | ICD-10-CM

## 2019-01-12 NOTE — Therapy (Signed)
[facility] [address] [place], [place], [address] Phone: [phone]   Fax:  [phone]  Physical Therapy Treatment  Patient Details  Name: Tonya Hall MRN: [id number] Date of Birth: December 23, 1950 Referring Provider (PT): Frederich Cha   Progress Note Reporting Period 12/18/2018  to 01/12/2019  See note below for Objective Data and Assessment of Progress/Goals.      Encounter Date: 01/12/2019  PT End of Session - 01/12/19 1230    Visit Number  10    Number of Visits  20    Date for PT Re-Evaluation  01/17/19    Authorization Type  [employer]    Authorization Time Period  9/17-->02/17/19    Authorization - Visit Number  10    Authorization - Number of Visits  10    PT Start Time  6962    PT Stop Time  1220    PT Time Calculation (min)  45 min    Activity Tolerance  Patient tolerated treatment well;No increased pain    Behavior During Therapy  WFL for tasks assessed/performed       Past Medical History:  Diagnosis Date  . Asthma    Albuterol in haler prn  . Cataract    immature unsure which eye  . Chronic back pain   . Degenerative disk disease    psoriatic  . Diabetes mellitus without complication (HCC)    diet controlled  . Esophageal motility disorder    Non-specific, see modified barium study/speech path, BP  . GERD (gastroesophageal reflux disease)    takes Protonix bid  . History of blood transfusion    post c-section  . History of bronchitis   . HTN (hypertension)   . Hyperlipidemia    takes Pravastatin daily  . Lymphocytic colitis 05/26/2010   Responded to Entocort x 3 MOS  . Neuropathy   . Osteoporosis    gets Boniva every 3 months  . Peripheral edema    takes HCTZ daily  . PONV (postoperative nausea and vomiting)   . Psoriatic arthritis ([place])    Dr. Katherina Right  . Raynaud's disease   . Seasonal allergies   . SUI (stress urinary incontinence, female)   . Urinary urgency      Past Surgical History:  Procedure Laterality Date  . BIOPSY  04/17/2016   Procedure: BIOPSY;  Surgeon: Danie Binder, MD;  [facility];  Service: Endoscopy;;  random colon bx's  . CERVICAL SPINE SURGERY  09/09/2017  . CESAREAN SECTION  1974, 1978      . COLONOSCOPY  06/19/2008   [id number] internal hemorrhoids/mild sigmoin colon diverticulosis  . COLONOSCOPY  2012   Dr. Gala Romney: normal rectum, diverticula, lymphocytic colitis   . COLONOSCOPY WITH PROPOFOL N/A 04/17/2016   Procedure: COLONOSCOPY WITH PROPOFOL;  Surgeon: Danie Binder, MD;  [facility];  Service: Endoscopy;  Laterality: N/A;  900  . DILATION AND CURETTAGE OF UTERUS  1977   spontaneous abortion  . ESOPHAGOGASTRODUODENOSCOPY  06/19/2008   [id number] gastritis  . LUMBAR DISC ARTHROPLASTY  7/98  . LUMBAR FUSION  6/03, 11/08, 11/14   L4-5 fusion, L2-3 fusion, L1-2  . ODONTOID SCREW INSERTION N/A 01/27/2018   Procedure: ODONTOID SCREW INSERTION;  Surgeon: Newman Pies, MD;  Location: [facility];  Service: Neurosurgery;  Laterality: N/A;  ODONTOID SCREW INSERTION  . TONSILLECTOMY AND ADENOIDECTOMY    . TUBAL LIGATION      There were no vitals filed for  this visit.  Subjective Assessment - 01/12/19 1139    Subjective  Pt states that she is doing her HEP, she has no questions.    Pertinent History  psoriatic OA, 3 prior back surgeries, DM,    Currently in Pain?  Yes    Pain Score  1     Pain Location  Neck    Pain Orientation  Right    Pain Descriptors / Indicators  Aching    Pain Type  Acute pain    Pain Radiating Towards  Rt arm hurts to the mid deltoid, had stopped hurting but it has began bothering her again this week.    Pain Onset  In the past 7 days    Pain Frequency  Constant    Aggravating Factors   not sure    Pain Relieving Factors  not sure    Effect of Pain on Daily Activities  limits         [facility] PT Assessment - 01/12/19 0001      Assessment   Medical Diagnosis   Cervical pain/stiffness after surgery     Referring Provider (PT)  Frederich Cha     Onset Date/Surgical Date  01/27/18    Hand Dominance  Right    Next MD Visit  05/2020    Prior Therapy  [facility]      Precautions   Precautions  None;Fall      Restrictions   Weight Bearing Restrictions  No      Home Environment   Living Environment  Private residence      Prior Function   Level of Independence  Independent      Cognition   Overall Cognitive Status  Within Functional Limits for tasks assessed      Observation/Other Assessments   Focus on Therapeutic Outcomes (FOTO)   46 was 41       Posture/Postural Control   Posture/Postural Control  Postural limitations    Postural Limitations  Rounded Shoulders;Forward head;Decreased lumbar lordosis;Decreased thoracic kyphosis;Flexed trunk      AROM   Cervical Flexion  57   57   Cervical Extension  35   was 23    Cervical - Right Side Bend  15   was 8   Cervical - Left Side Bend  22   ws 15    Cervical - Right Rotation  32   was 25    Cervical - Left Rotation  35   was 35      Strength   Cervical Extension  3-/5    Cervical - Right Side Bend  3-/5   was 2/5    Cervical - Left Side Bend  3-/5   was 2+      Palpation   Palpation comment  multiple marked spasms throughout both trapezius                    [facility] Adult PT Treatment/Exercise - 01/12/19 0001      Exercises   Exercises  Neck      Neck Exercises: Supine   Neck Retraction  10 reps    Neck Retraction Limitations  scapular retraction x 10     Cervical Rotation  Both;5 reps;Other reps (comment)    Cervical Rotation Limitations  resisted     Lateral Flexion  5 reps;Both    Lateral Flexion Limitations  resisted     Other Supine Exercise  t-band decompression exercises x 5 each  Manual Therapy   Manual Therapy  Soft tissue mobilization    Manual therapy comments  Manual completed separate of other interventions    Joint Mobilization  Grade II to  C2-5 for sb and rotation at end range to improve ROM     Soft tissue mobilization  to decrease mm spasm                PT Short Term Goals - 01/12/19 1149      PT SHORT TERM GOAL #1   Title  PT Rt UE radiating pain to be no greater than a 4/10 and to go no further than the elbow to demonstrate decreased nerve root irritation.    Time  4    Period  Weeks    Status  Achieved    Target Date  01/15/19      PT SHORT TERM GOAL #2   Title  PT to only be having headaches once a week    Time  4    Period  Weeks    Status  Achieved      PT SHORT TERM GOAL #3   Title  PT cervical extension to improve 15 degrees to allow pt to put her eyedrops in her eyes easier.    Time  4    Period  Weeks    Status  Not Met      PT SHORT TERM GOAL #4   Title  PT cervical rotation to improve by 15 degrees to improve safety of driving.    Time  4    Period  Weeks    Status  On-going        PT Long Term Goals - 01/12/19 1150      PT LONG TERM GOAL #1   Title  Pt  to have no radicular sx in her right arm and her cervical  pain to be no greater than a 2/10    Time  8    Period  Weeks    Status  On-going      PT LONG TERM GOAL #2   Title  PT to be I in self stretching exercises to decrease mm spasms in upper trap bilaterally to mild to assist in decreasing pain.    Time  8    Period  Weeks    Status  Partially Met      PT LONG TERM GOAL #3   Title  Pt cervical rotation to improve 30 degrees in both directions to improve safety of driving.    Time  8    Period  Weeks    Status  On-going      PT LONG TERM GOAL #4   Title  Cervical strength to be increased to no less than a 4/5 so that pt has no more headaches.    Time  8    Period  Weeks    Status  On-going            Plan - 01/12/19 1230    Clinical Impression Statement  PT reassessed today with improvement in all aspects but ROM and strength in cervical area continue to be significantly decreased.  Added t-band  decompression exercises needing to modify tband to have hand grips due to pt's psoriatic arthritis.    Personal Factors and Comorbidities  Comorbidity 3+;Past/Current Experience;Time since onset of injury/illness/exacerbation    Comorbidities  psoriatric arthritis, 3 back surgeries, 2 neck surgeries,    Examination-Activity Limitations  Bathing;Lift;Other  Examination-Participation Restrictions  Church;Other    Stability/Clinical Decision Making  Evolving/Moderate complexity    Rehab Potential  Good    PT Frequency  3x / week    PT Duration  4 weeks    PT Treatment/Interventions  ADLs/Self Care Home Management;Patient/family education;Manual techniques;Dry needling;Therapeutic exercise;Therapeutic activities;Electrical Stimulation    PT Next Visit Plan  Continue to progress cervical and thoracic mobility exercise as tolerated. Continue to address muscle restrictions with targeted manual therapy to reduce pain and improve functional outcomes.    PT Home Exercise Plan  scapular retraction, sitting tall, cervical excursion; 9/22: decompression exercise and isometric; 9/28 chin tucks, shoulder extension with towel, towel roll down t-spine in sitting and paired with scap retraction       Patient will benefit from skilled therapeutic intervention in order to improve the following deficits and impairments:  Decreased range of motion, Decreased strength, Increased fascial restricitons, Increased muscle spasms, Impaired perceived functional ability, Impaired sensation, Impaired UE functional use, Pain, Postural dysfunction  Visit Diagnosis: Other muscle spasm  Radiculopathy, cervical region     Problem List Patient Active Problem List   Diagnosis Date Noted  . Closed nondisplaced odontoid fracture with type II morphology ([place]) 01/27/2018  . Odontoid fracture ([place]) 01/24/2018  . Chest pain 11/14/2017  . Transaminitis 06/22/2016  . Collagenous colitis 05/23/2016  . Psoriasis 05/21/2016  .  High risk medication use 02/15/2016  . Age-related osteoporosis without current pathological fracture 02/15/2016  . Trochanteric bursitis, left hip 02/15/2016  . Sacroiliitis, not elsewhere classified ([place]) 02/15/2016  . Chronic pain syndrome 02/15/2016  . Abnormal weight gain 06/25/2012  . Hypertension 07/03/2011  . Hypercholesteremia 07/03/2011  . Psoriatic arthritis ([place]) 07/03/2011  . Osteoarthritis of multiple joints 07/03/2011  . Esophageal motility disorder 02/14/2011  . Cough 02/14/2011  . GERD 05/05/2010    Rayetta Humphrey, PT CLT [phone] 01/12/2019, 12:33 PM  [facility] [address] [place], [place], [address] Phone: [phone]   Fax:  [phone]  Name: NIOMI VALENT MRN: [id number] Date of Birth: [birth year]

## 2019-01-13 ENCOUNTER — Ambulatory Visit (HOSPITAL_COMMUNITY): Payer: PPO | Admitting: Physical Therapy

## 2019-01-13 ENCOUNTER — Encounter (HOSPITAL_COMMUNITY): Payer: Self-pay | Admitting: Physical Therapy

## 2019-01-13 DIAGNOSIS — M62838 Other muscle spasm: Secondary | ICD-10-CM | POA: Diagnosis not present

## 2019-01-13 DIAGNOSIS — M5412 Radiculopathy, cervical region: Secondary | ICD-10-CM

## 2019-01-13 NOTE — Therapy (Signed)
[facility] [place], [place], [address] Phone: [phone]   Fax:  [phone]  Physical Therapy Treatment  Patient Details  Name: Tonya Hall MRN: [id number] Date of Birth: Feb 03, 1951 Referring Provider (PT): Frederich Cha    Encounter Date: 01/13/2019  PT End of Session - 01/13/19 0959    Visit Number  11    Number of Visits  20    Date for PT Re-Evaluation  01/17/19    Authorization Type  [employer]    Authorization Time Period  9/17-->02/17/19    Authorization - Visit Number  11    Authorization - Number of Visits  20    PT Start Time  0920    PT Stop Time  1000    PT Time Calculation (min)  40 min    Activity Tolerance  Patient tolerated treatment well;No increased pain    Behavior During Therapy  WFL for tasks assessed/performed       Past Medical History:  Diagnosis Date  . Asthma    Albuterol in haler prn  . Cataract    immature unsure which eye  . Chronic back pain   . Degenerative disk disease    psoriatic  . Diabetes mellitus without complication (HCC)    diet controlled  . Esophageal motility disorder    Non-specific, see modified barium study/speech path, BP  . GERD (gastroesophageal reflux disease)    takes Protonix bid  . History of blood transfusion    post c-section  . History of bronchitis   . HTN (hypertension)   . Hyperlipidemia    takes Pravastatin daily  . Lymphocytic colitis 05/26/2010   Responded to Entocort x 3 MOS  . Neuropathy   . Osteoporosis    gets Boniva every 3 months  . Peripheral edema    takes HCTZ daily  . PONV (postoperative nausea and vomiting)   . Psoriatic arthritis ([place])    Dr. Katherina Right  . Raynaud's disease   . Seasonal allergies   . SUI (stress urinary incontinence, female)   . Urinary urgency     Past Surgical History:  Procedure Laterality Date  . BIOPSY  04/17/2016   Procedure: BIOPSY;  Surgeon: Danie Binder, MD;  [facility];  Service: Endoscopy;;  random colon bx's  . CERVICAL SPINE SURGERY  09/09/2017  . CESAREAN SECTION  1974, 1978      . COLONOSCOPY  06/19/2008   [id number] internal hemorrhoids/mild sigmoin colon diverticulosis  . COLONOSCOPY  2012   Dr. Gala Romney: normal rectum, diverticula, lymphocytic colitis   . COLONOSCOPY WITH PROPOFOL N/A 04/17/2016   Procedure: COLONOSCOPY WITH PROPOFOL;  Surgeon: Danie Binder, MD;  [facility];  Service: Endoscopy;  Laterality: N/A;  900  . DILATION AND CURETTAGE OF UTERUS  1977   spontaneous abortion  . ESOPHAGOGASTRODUODENOSCOPY  06/19/2008   [id number] gastritis  . LUMBAR DISC ARTHROPLASTY  7/98  . LUMBAR FUSION  6/03, 11/08, 11/14   L4-5 fusion, L2-3 fusion, L1-2  . ODONTOID SCREW INSERTION N/A 01/27/2018   Procedure: ODONTOID SCREW INSERTION;  Surgeon: Newman Pies, MD;  Location: [facility];  Service: Neurosurgery;  Laterality: N/A;  ODONTOID SCREW INSERTION  . TONSILLECTOMY AND ADENOIDECTOMY    . TUBAL LIGATION      There were no vitals filed for this visit.  Subjective Assessment - 01/13/19 0920    Subjective  Pt has not been able to do exercises due to  her grandchild being at her home completing her home schooling.    Pertinent History  psoriatic OA, 3 prior back surgeries, DM,    Patient Stated Goals  decreased headaches, improved ROM    Currently in Pain?  Yes    Pain Score  1     Pain Location  Neck    Pain Descriptors / Indicators  Aching    Pain Type  Chronic pain    Pain Onset  More than a month ago    Pain Frequency  Intermittent    Aggravating Factors   sitting postiions    Pain Relieving Factors  exercises            [facility] Adult PT Treatment/Exercise - 01/13/19 0001      Exercises   Exercises  Neck      Neck Exercises: Seated   Neck Retraction  10 reps;3 secs    X to V  5 reps    W Back  15 reps    W Back Weights (lbs)  5" holds    Shoulder Shrugs  5 reps    Shoulder Rolls  5 reps;Backwards     Other Seated Exercise  3D thoracic excursion    Other Seated Exercise  3 D cervical x 3       Neck Exercises: Supine   Neck Retraction  10 reps    Neck Retraction Limitations  scapular retraction x 10     Cervical Rotation  Both;5 reps;Other reps (comment)    Cervical Rotation Limitations  resisted     Lateral Flexion  5 reps;Both    Lateral Flexion Limitations  resisted     Other Supine Exercise  scapular retractions 10 x 3"      Manual Therapy   Manual Therapy  Soft tissue mobilization;Joint mobilization    Manual therapy comments  Manual completed separate of other interventions    Joint Mobilization  Grade II to both thoracic and cervical jt to improve motion     Soft tissue mobilization  Prone: bilateral lats and periscapular; supine UT, and SCM      Neck Exercises: Stretches   Other Neck Stretches  POE x 2 min               PT Short Term Goals - 01/12/19 1149      PT SHORT TERM GOAL #1   Title  PT Rt UE radiating pain to be no greater than a 4/10 and to go no further than the elbow to demonstrate decreased nerve root irritation.    Time  4    Period  Weeks    Status  Achieved    Target Date  01/15/19      PT SHORT TERM GOAL #2   Title  PT to only be having headaches once a week    Time  4    Period  Weeks    Status  Achieved      PT SHORT TERM GOAL #3   Title  PT cervical extension to improve 15 degrees to allow pt to put her eyedrops in her eyes easier.    Time  4    Period  Weeks    Status  Not Met      PT SHORT TERM GOAL #4   Title  PT cervical rotation to improve by 15 degrees to improve safety of driving.    Time  4    Period  Weeks    Status  On-going  PT Long Term Goals - 01/12/19 1150      PT LONG TERM GOAL #1   Title  Pt  to have no radicular sx in her right arm and her cervical  pain to be no greater than a 2/10    Time  8    Period  Weeks    Status  On-going      PT LONG TERM GOAL #2   Title  PT to be I in self stretching  exercises to decrease mm spasms in upper trap bilaterally to mild to assist in decreasing pain.    Time  8    Period  Weeks    Status  Partially Met      PT LONG TERM GOAL #3   Title  Pt cervical rotation to improve 30 degrees in both directions to improve safety of driving.    Time  8    Period  Weeks    Status  On-going      PT LONG TERM GOAL #4   Title  Cervical strength to be increased to no less than a 4/5 so that pt has no more headaches.    Time  8    Period  Weeks    Status  On-going            Plan - 01/13/19 1000    Clinical Impression Statement  This session focused on improving posture and increasing both thoracic and cervical motion.  Pt cervical pain continues to decrease, mm spasms significantly decreased.  PT will conitnue to benefit from therapy to improve ROM.    Personal Factors and Comorbidities  Comorbidity 3+;Past/Current Experience;Time since onset of injury/illness/exacerbation    Comorbidities  psoriatric arthritis, 3 back surgeries, 2 neck surgeries,    Examination-Activity Limitations  Bathing;Lift;Other    Examination-Participation Restrictions  Church;Other    Stability/Clinical Decision Making  Evolving/Moderate complexity    Rehab Potential  Good    PT Frequency  3x / week    PT Duration  4 weeks    PT Treatment/Interventions  ADLs/Self Care Home Management;Patient/family education;Manual techniques;Dry needling;Therapeutic exercise;Therapeutic activities;Electrical Stimulation    PT Next Visit Plan  Continue to progress cervical and thoracic mobility exercise as tolerated. Continue to address muscle restrictions with targeted manual therapy to reduce pain and improve functional outcomes.    PT Home Exercise Plan  scapular retraction, sitting tall, cervical excursion; 9/22: decompression exercise and isometric; 9/28 chin tucks, shoulder extension with towel, towel roll down t-spine in sitting and paired with scap retraction       Patient will  benefit from skilled therapeutic intervention in order to improve the following deficits and impairments:  Decreased range of motion, Decreased strength, Increased fascial restricitons, Increased muscle spasms, Impaired perceived functional ability, Impaired sensation, Impaired UE functional use, Pain, Postural dysfunction  Visit Diagnosis: Other muscle spasm  Radiculopathy, cervical region     Problem List Patient Active Problem List   Diagnosis Date Noted  . Closed nondisplaced odontoid fracture with type II morphology ([place]) 01/27/2018  . Odontoid fracture ([place]) 01/24/2018  . Chest pain 11/14/2017  . Transaminitis 06/22/2016  . Collagenous colitis 05/23/2016  . Psoriasis 05/21/2016  . High risk medication use 02/15/2016  . Age-related osteoporosis without current pathological fracture 02/15/2016  . Trochanteric bursitis, left hip 02/15/2016  . Sacroiliitis, not elsewhere classified ([place]) 02/15/2016  . Chronic pain syndrome 02/15/2016  . Abnormal weight gain 06/25/2012  . Hypertension 07/03/2011  . Hypercholesteremia 07/03/2011  . Psoriatic arthritis (  [place]) 07/03/2011  . Osteoarthritis of multiple joints 07/03/2011  . Esophageal motility disorder 02/14/2011  . Cough 02/14/2011  . GERD 05/05/2010   Rayetta Humphrey, PT CLT [phone]  01/13/2019, 10:04 AM  [facility] [address] [place], [place], [address] Phone: [phone]   Fax:  [phone]  Name: IMOJEAN YOSHINO MRN: [id number] Date of Birth: 10/10/1950

## 2019-01-14 ENCOUNTER — Ambulatory Visit: Payer: PPO | Admitting: Physician Assistant

## 2019-01-14 ENCOUNTER — Other Ambulatory Visit: Payer: Self-pay

## 2019-01-14 ENCOUNTER — Encounter: Payer: Self-pay | Admitting: Physician Assistant

## 2019-01-14 VITALS — BP 141/81 | HR 91 | Resp 12 | Ht 59.0 in | Wt 144.0 lb

## 2019-01-14 DIAGNOSIS — M810 Age-related osteoporosis without current pathological fracture: Secondary | ICD-10-CM | POA: Diagnosis not present

## 2019-01-14 DIAGNOSIS — M533 Sacrococcygeal disorders, not elsewhere classified: Secondary | ICD-10-CM

## 2019-01-14 DIAGNOSIS — L4050 Arthropathic psoriasis, unspecified: Secondary | ICD-10-CM

## 2019-01-14 DIAGNOSIS — I7300 Raynaud's syndrome without gangrene: Secondary | ICD-10-CM

## 2019-01-14 DIAGNOSIS — G8929 Other chronic pain: Secondary | ICD-10-CM

## 2019-01-14 DIAGNOSIS — Z5181 Encounter for therapeutic drug level monitoring: Secondary | ICD-10-CM

## 2019-01-14 DIAGNOSIS — K52831 Collagenous colitis: Secondary | ICD-10-CM

## 2019-01-14 DIAGNOSIS — G894 Chronic pain syndrome: Secondary | ICD-10-CM

## 2019-01-14 DIAGNOSIS — Z8719 Personal history of other diseases of the digestive system: Secondary | ICD-10-CM

## 2019-01-14 DIAGNOSIS — M5030 Other cervical disc degeneration, unspecified cervical region: Secondary | ICD-10-CM

## 2019-01-14 DIAGNOSIS — Z79899 Other long term (current) drug therapy: Secondary | ICD-10-CM | POA: Diagnosis not present

## 2019-01-14 DIAGNOSIS — M5134 Other intervertebral disc degeneration, thoracic region: Secondary | ICD-10-CM | POA: Diagnosis not present

## 2019-01-14 DIAGNOSIS — M8949 Other hypertrophic osteoarthropathy, multiple sites: Secondary | ICD-10-CM | POA: Diagnosis not present

## 2019-01-14 DIAGNOSIS — M159 Polyosteoarthritis, unspecified: Secondary | ICD-10-CM

## 2019-01-14 DIAGNOSIS — I10 Essential (primary) hypertension: Secondary | ICD-10-CM

## 2019-01-14 DIAGNOSIS — Z8639 Personal history of other endocrine, nutritional and metabolic disease: Secondary | ICD-10-CM

## 2019-01-14 DIAGNOSIS — L409 Psoriasis, unspecified: Secondary | ICD-10-CM

## 2019-01-14 DIAGNOSIS — G5763 Lesion of plantar nerve, bilateral lower limbs: Secondary | ICD-10-CM | POA: Diagnosis not present

## 2019-01-14 DIAGNOSIS — K224 Dyskinesia of esophagus: Secondary | ICD-10-CM

## 2019-01-14 DIAGNOSIS — M7062 Trochanteric bursitis, left hip: Secondary | ICD-10-CM

## 2019-01-15 ENCOUNTER — Other Ambulatory Visit: Payer: Self-pay | Admitting: *Deleted

## 2019-01-15 ENCOUNTER — Ambulatory Visit (HOSPITAL_COMMUNITY): Payer: PPO | Admitting: Physical Therapy

## 2019-01-15 ENCOUNTER — Other Ambulatory Visit: Payer: Self-pay

## 2019-01-15 DIAGNOSIS — M62838 Other muscle spasm: Secondary | ICD-10-CM | POA: Diagnosis not present

## 2019-01-15 DIAGNOSIS — M5412 Radiculopathy, cervical region: Secondary | ICD-10-CM

## 2019-01-15 MED ORDER — COSENTYX SENSOREADY PEN 150 MG/ML ~~LOC~~ SOAJ: 150.0000 mg | SUBCUTANEOUS | 2 refills | Status: DC

## 2019-01-15 NOTE — Therapy (Signed)
[place], [place], [address] Phone: [phone]   Fax:  [phone]  Physical Therapy Treatment  Patient Details  Name: Tonya Hall MRN: [id number] Date of Birth: March 23, 1951 Referring Provider (PT): Frederich Cha    Encounter Date: 01/15/2019  PT End of Session - 01/15/19 1054    Visit Number  12    Number of Visits  20    Date for PT Re-Evaluation  02/17/19    Authorization Type  [employer]    Authorization Time Period  9/17-->02/17/19    Authorization - Visit Number  12    Authorization - Number of Visits  20    PT Start Time  1000    PT Stop Time  9449    PT Time Calculation (min)  44 min    Activity Tolerance  Patient tolerated treatment well;No increased pain    Behavior During Therapy  WFL for tasks assessed/performed       Past Medical History:  Diagnosis Date  . Asthma    Albuterol in haler prn  . Cataract    immature unsure which eye  . Chronic back pain   . Degenerative disk disease    psoriatic  . Diabetes mellitus without complication (HCC)    diet controlled  . Esophageal motility disorder    Non-specific, see modified barium study/speech path, BP  . GERD (gastroesophageal reflux disease)    takes Protonix bid  . History of blood transfusion    post c-section  . History of bronchitis   . HTN (hypertension)   . Hyperlipidemia    takes Pravastatin daily  . Lymphocytic colitis 05/26/2010   Responded to Entocort x 3 MOS  . Neuropathy   . Osteoporosis    gets Boniva every 3 months  . Peripheral edema    takes HCTZ daily  . PONV (postoperative nausea and vomiting)   . Psoriatic arthritis ([place])    Dr. Katherina Right  . Raynaud's disease   . Seasonal allergies   . SUI (stress urinary incontinence, female)   . Urinary urgency     Past Surgical History:  Procedure Laterality Date  . BIOPSY  04/17/2016   Procedure: BIOPSY;  Surgeon: Danie Binder, MD;  Location: [facility];  Service: Endoscopy;;  random colon bx's  . CERVICAL SPINE SURGERY  09/09/2017  . CESAREAN SECTION  1974, 1978      . COLONOSCOPY  06/19/2008   QPR:FFMBW internal hemorrhoids/mild sigmoin colon diverticulosis  . COLONOSCOPY  2012   Dr. Gala Romney: normal rectum, diverticula, lymphocytic colitis   . COLONOSCOPY WITH PROPOFOL N/A 04/17/2016   Procedure: COLONOSCOPY WITH PROPOFOL;  Surgeon: Danie Binder, MD;  Location: [facility];  Service: Endoscopy;  Laterality: N/A;  900  . DILATION AND CURETTAGE OF UTERUS  1977   spontaneous abortion  . ESOPHAGOGASTRODUODENOSCOPY  06/19/2008   GYK:ZLDJTTSV gastritis  . LUMBAR DISC ARTHROPLASTY  7/98  . LUMBAR FUSION  6/03, 11/08, 11/14   L4-5 fusion, L2-3 fusion, L1-2  . ODONTOID SCREW INSERTION N/A 01/27/2018   Procedure: ODONTOID SCREW INSERTION;  Surgeon: Newman Pies, MD;  Location: [facility];  Service: Neurosurgery;  Laterality: N/A;  ODONTOID SCREW INSERTION  . TONSILLECTOMY AND ADENOIDECTOMY    . TUBAL LIGATION      There were no vitals filed for this visit.  Subjective Assessment - 01/15/19 1011    Subjective  pt states her neck does not hurt at all today.  States she got an epidural in her back and it has helped.    Currently in Pain?  No/denies                       [facility] Adult PT Treatment/Exercise - 01/15/19 0001      Neck Exercises: Seated   Neck Retraction  10 reps;3 secs    W Back  15 reps    W Back Weights (lbs)  5" holds    Shoulder Shrugs  10 reps    Shoulder Rolls  10 reps;Backwards    Other Seated Exercise  3 D cervical x 3       Neck Exercises: Prone   Shoulder Extension  10 reps    Rows  10 reps      Manual Therapy   Manual Therapy  Soft tissue mobilization    Manual therapy comments  Manual completed separate of other interventions    Soft tissue mobilization  Prone: bilateral lats and periscapular; supine UT, and SCM      Neck Exercises: Stretches   Other Neck Stretches  POE x 2 min                PT Short Term Goals - 01/12/19 1149      PT SHORT TERM GOAL #1   Title  PT Rt UE radiating pain to be no greater than a 4/10 and to go no further than the elbow to demonstrate decreased nerve root irritation.    Time  4    Period  Weeks    Status  Achieved    Target Date  01/15/19      PT SHORT TERM GOAL #2   Title  PT to only be having headaches once a week    Time  4    Period  Weeks    Status  Achieved      PT SHORT TERM GOAL #3   Title  PT cervical extension to improve 15 degrees to allow pt to put her eyedrops in her eyes easier.    Time  4    Period  Weeks    Status  Not Met      PT SHORT TERM GOAL #4   Title  PT cervical rotation to improve by 15 degrees to improve safety of driving.    Time  4    Period  Weeks    Status  On-going        PT Long Term Goals - 01/12/19 1150      PT LONG TERM GOAL #1   Title  Pt  to have no radicular sx in her right arm and her cervical  pain to be no greater than a 2/10    Time  8    Period  Weeks    Status  On-going      PT LONG TERM GOAL #2   Title  PT to be I in self stretching exercises to decrease mm spasms in upper trap bilaterally to mild to assist in decreasing pain.    Time  8    Period  Weeks    Status  Partially Met      PT LONG TERM GOAL #3   Title  Pt cervical rotation to improve 30 degrees in both directions to improve safety of driving.    Time  8    Period  Weeks    Status  On-going      PT LONG TERM  GOAL #4   Title  Cervical strength to be increased to no less than a 4/5 so that pt has no more headaches.    Time  8    Period  Weeks    Status  On-going            Plan - 01/15/19 1055    Clinical Impression Statement  conitued focus on improving posture, spinal  ROM, strength/stab and reducing pain.  Minimal cues required during session today with mostly good form achieved.  Added strengthening exercises in prone and completed manual in prone at EOS.  multiple knots in all  areas but reduced with manual.  conitnued to be painfree at end of session.    Personal Factors and Comorbidities  Comorbidity 3+;Past/Current Experience;Time since onset of injury/illness/exacerbation    Comorbidities  psoriatric arthritis, 3 back surgeries, 2 neck surgeries,    Examination-Activity Limitations  Bathing;Lift;Other    Examination-Participation Restrictions  Church;Other    Stability/Clinical Decision Making  Evolving/Moderate complexity    Rehab Potential  Good    PT Frequency  3x / week    PT Duration  4 weeks    PT Treatment/Interventions  ADLs/Self Care Home Management;Patient/family education;Manual techniques;Dry needling;Therapeutic exercise;Therapeutic activities;Electrical Stimulation    PT Next Visit Plan  Continue to progress cervical and thoracic mobility exercise as tolerated. Continue to address muscle restrictions with targeted manual therapy to reduce pain and improve functional outcomes.    PT Home Exercise Plan  scapular retraction, sitting tall, cervical excursion; 9/22: decompression exercise and isometric; 9/28 chin tucks, shoulder extension with towel, towel roll down t-spine in sitting and paired with scap retraction       Patient will benefit from skilled therapeutic intervention in order to improve the following deficits and impairments:  Decreased range of motion, Decreased strength, Increased fascial restricitons, Increased muscle spasms, Impaired perceived functional ability, Impaired sensation, Impaired UE functional use, Pain, Postural dysfunction  Visit Diagnosis: Other muscle spasm  Radiculopathy, cervical region     Problem List Patient Active Problem List   Diagnosis Date Noted  . Closed nondisplaced odontoid fracture with type II morphology ([place]) 01/27/2018  . Odontoid fracture ([place]) 01/24/2018  . Chest pain 11/14/2017  . Transaminitis 06/22/2016  . Collagenous colitis 05/23/2016  . Psoriasis 05/21/2016  . High risk medication use  02/15/2016  . Age-related osteoporosis without current pathological fracture 02/15/2016  . Trochanteric bursitis, left hip 02/15/2016  . Sacroiliitis, not elsewhere classified ([place]) 02/15/2016  . Chronic pain syndrome 02/15/2016  . Abnormal weight gain 06/25/2012  . Hypertension 07/03/2011  . Hypercholesteremia 07/03/2011  . Psoriatic arthritis ([place]) 07/03/2011  . Osteoarthritis of multiple joints 07/03/2011  . Esophageal motility disorder 02/14/2011  . Cough 02/14/2011  . GERD 05/05/2010   Teena Irani, PTA/CLT [phone]  Teena Irani 01/15/2019, 10:58 AM  [place], [place], [address] Phone: [phone]   Fax:  [phone]  Name: ZENDAYAH HARDGRAVE MRN: [id number] Date of Birth: 1951-02-24

## 2019-01-15 NOTE — Progress Notes (Signed)
CBC stable. Glucose is mildly elevated. ALT is borderline elevated. Please advise patient to avoid taking NSAIDs, tylenol, and alcohol.  We will continue to monitor.

## 2019-01-15 NOTE — Telephone Encounter (Signed)
Refill request received via fax  Last Visit: 01/14/19 Next Visit: 06/16/19 Labs: 01/14/19 CBC stable. Glucose is mildly elevated. ALT is borderline elevated. TB Gold: 09/04/18 Neg   Okay to refill per Dr. Estanislado Pandy

## 2019-01-16 LAB — PAIN MGMT, PROFILE 5 W/CONF, U
Amphetamines: NEGATIVE ng/mL
Barbiturates: NEGATIVE ng/mL
Benzodiazepines: NEGATIVE ng/mL
Cocaine Metabolite: NEGATIVE ng/mL
Codeine: NEGATIVE ng/mL
Creatinine: 38.9 mg/dL
Hydrocodone: 380 ng/mL
Hydromorphone: 65 ng/mL
Marijuana Metabolite: NEGATIVE ng/mL
Methadone Metabolite: NEGATIVE ng/mL
Morphine: NEGATIVE ng/mL
Norhydrocodone: 643 ng/mL
Opiates: POSITIVE ng/mL
Oxidant: NEGATIVE ug/mL
Oxycodone: NEGATIVE ng/mL
pH: 5.8 (ref 4.5–9.0)

## 2019-01-16 LAB — CBC WITH DIFFERENTIAL/PLATELET
Absolute Monocytes: 923 cells/uL (ref 200–950)
Basophils Absolute: 135 cells/uL (ref 0–200)
Basophils Relative: 1.1 %
Eosinophils Absolute: 849 cells/uL — ABNORMAL HIGH (ref 15–500)
Eosinophils Relative: 6.9 %
HCT: 40.4 % (ref 35.0–45.0)
Hemoglobin: 13.3 g/dL (ref 11.7–15.5)
Lymphs Abs: 1661 cells/uL (ref 850–3900)
MCH: 32.2 pg (ref 27.0–33.0)
MCHC: 32.9 g/dL (ref 32.0–36.0)
MCV: 97.8 fL (ref 80.0–100.0)
MPV: 12.1 fL (ref 7.5–12.5)
Monocytes Relative: 7.5 %
Neutro Abs: 8733 cells/uL — ABNORMAL HIGH (ref 1500–7800)
Neutrophils Relative %: 71 %
Platelets: 262 10*3/uL (ref 140–400)
RBC: 4.13 10*6/uL (ref 3.80–5.10)
RDW: 12.9 % (ref 11.0–15.0)
Total Lymphocyte: 13.5 %
WBC: 12.3 10*3/uL — ABNORMAL HIGH (ref 3.8–10.8)

## 2019-01-16 LAB — COMPLETE METABOLIC PANEL WITH GFR
AG Ratio: 1.9 (calc) (ref 1.0–2.5)
ALT: 33 U/L — ABNORMAL HIGH (ref 6–29)
AST: 27 U/L (ref 10–35)
Albumin: 4.2 g/dL (ref 3.6–5.1)
Alkaline phosphatase (APISO): 45 U/L (ref 37–153)
BUN: 23 mg/dL (ref 7–25)
CO2: 24 mmol/L (ref 20–32)
Calcium: 9.8 mg/dL (ref 8.6–10.4)
Chloride: 107 mmol/L (ref 98–110)
Creat: 0.91 mg/dL (ref 0.50–0.99)
GFR, Est African American: 75 mL/min/{1.73_m2} (ref >=60)
GFR, Est Non African American: 64 mL/min/{1.73_m2} (ref >=60)
Globulin: 2.2 g/dL (calc) (ref 1.9–3.7)
Glucose, Bld: 127 mg/dL — ABNORMAL HIGH (ref 65–99)
Potassium: 4.9 mmol/L (ref 3.5–5.3)
Sodium: 139 mmol/L (ref 135–146)
Total Bilirubin: 0.3 mg/dL (ref 0.2–1.2)
Total Protein: 6.4 g/dL (ref 6.1–8.1)

## 2019-01-16 NOTE — Progress Notes (Signed)
UDS is consistent with treatment.

## 2019-01-17 ENCOUNTER — Other Ambulatory Visit: Payer: Self-pay | Admitting: Rheumatology

## 2019-01-19 NOTE — Telephone Encounter (Signed)
Last Visit: 01/14/19 Next Visit: 06/16/19  Okay to refill per Dr. Estanislado Pandy

## 2019-01-20 ENCOUNTER — Encounter (HOSPITAL_COMMUNITY): Payer: PPO

## 2019-01-22 ENCOUNTER — Telehealth (HOSPITAL_COMMUNITY): Payer: Self-pay

## 2019-01-22 ENCOUNTER — Ambulatory Visit: Payer: PPO | Admitting: Physician Assistant

## 2019-01-22 NOTE — Telephone Encounter (Signed)
Pt has to pick up granddaughter and can not come in today

## 2019-01-23 ENCOUNTER — Ambulatory Visit (HOSPITAL_COMMUNITY): Payer: PPO

## 2019-01-23 DIAGNOSIS — M810 Age-related osteoporosis without current pathological fracture: Secondary | ICD-10-CM | POA: Diagnosis not present

## 2019-01-23 DIAGNOSIS — E2749 Other adrenocortical insufficiency: Secondary | ICD-10-CM | POA: Diagnosis not present

## 2019-01-23 DIAGNOSIS — E039 Hypothyroidism, unspecified: Secondary | ICD-10-CM | POA: Diagnosis not present

## 2019-01-23 DIAGNOSIS — E119 Type 2 diabetes mellitus without complications: Secondary | ICD-10-CM | POA: Diagnosis not present

## 2019-01-23 DIAGNOSIS — E559 Vitamin D deficiency, unspecified: Secondary | ICD-10-CM | POA: Diagnosis not present

## 2019-01-26 ENCOUNTER — Telehealth (HOSPITAL_COMMUNITY): Payer: Self-pay

## 2019-01-26 DIAGNOSIS — J40 Bronchitis, not specified as acute or chronic: Secondary | ICD-10-CM | POA: Diagnosis not present

## 2019-01-26 NOTE — Telephone Encounter (Signed)
pt called to cancel due to she has broncitis.

## 2019-01-27 ENCOUNTER — Encounter (HOSPITAL_COMMUNITY): Payer: PPO

## 2019-01-28 ENCOUNTER — Telehealth (HOSPITAL_COMMUNITY): Payer: Self-pay

## 2019-01-28 NOTE — Telephone Encounter (Signed)
pt called to cancel this appt due to she still has broncitis

## 2019-01-29 ENCOUNTER — Encounter (HOSPITAL_COMMUNITY): Payer: PPO

## 2019-01-30 ENCOUNTER — Other Ambulatory Visit: Payer: Self-pay

## 2019-01-30 DIAGNOSIS — Z20822 Contact with and (suspected) exposure to covid-19: Secondary | ICD-10-CM

## 2019-02-01 DIAGNOSIS — J189 Pneumonia, unspecified organism: Secondary | ICD-10-CM

## 2019-02-01 HISTORY — DX: Pneumonia, unspecified organism: J18.9

## 2019-02-01 LAB — NOVEL CORONAVIRUS, NAA: SARS-CoV-2, NAA: DETECTED — AB

## 2019-02-02 ENCOUNTER — Telehealth (HOSPITAL_COMMUNITY): Payer: Self-pay

## 2019-02-02 ENCOUNTER — Telehealth (HOSPITAL_COMMUNITY): Payer: Self-pay | Admitting: Physical Therapy

## 2019-02-02 NOTE — Telephone Encounter (Signed)
pt called to let us know that she has tested positive for the corona virus and will be in quarantined for 14 days. i have cancelled all the appts for this week and next per pt request. She will call back to reschedule.

## 2019-02-03 ENCOUNTER — Other Ambulatory Visit: Payer: Self-pay | Admitting: Gastroenterology

## 2019-02-03 ENCOUNTER — Encounter (HOSPITAL_COMMUNITY): Payer: PPO

## 2019-02-03 DIAGNOSIS — K219 Gastro-esophageal reflux disease without esophagitis: Secondary | ICD-10-CM

## 2019-02-03 DIAGNOSIS — R197 Diarrhea, unspecified: Secondary | ICD-10-CM

## 2019-02-05 ENCOUNTER — Encounter (HOSPITAL_COMMUNITY): Payer: PPO

## 2019-02-05 DIAGNOSIS — U071 COVID-19: Secondary | ICD-10-CM | POA: Diagnosis not present

## 2019-02-06 ENCOUNTER — Emergency Department (HOSPITAL_COMMUNITY): Payer: PPO

## 2019-02-06 ENCOUNTER — Other Ambulatory Visit: Payer: Self-pay

## 2019-02-06 ENCOUNTER — Encounter (HOSPITAL_COMMUNITY): Payer: Self-pay

## 2019-02-06 ENCOUNTER — Inpatient Hospital Stay (HOSPITAL_COMMUNITY)
Admission: EM | Admit: 2019-02-06 | Discharge: 2019-02-10 | DRG: 177 | Disposition: A | Discharge status: Home / Self Care | Payer: PPO | Attending: Internal Medicine | Admitting: Internal Medicine

## 2019-02-06 DIAGNOSIS — R197 Diarrhea, unspecified: Secondary | ICD-10-CM | POA: Diagnosis present

## 2019-02-06 DIAGNOSIS — Z794 Long term (current) use of insulin: Secondary | ICD-10-CM

## 2019-02-06 DIAGNOSIS — I10 Essential (primary) hypertension: Secondary | ICD-10-CM | POA: Insufficient documentation

## 2019-02-06 DIAGNOSIS — R0902 Hypoxemia: Secondary | ICD-10-CM | POA: Diagnosis not present

## 2019-02-06 DIAGNOSIS — L4050 Arthropathic psoriasis, unspecified: Secondary | ICD-10-CM | POA: Diagnosis not present

## 2019-02-06 DIAGNOSIS — J189 Pneumonia, unspecified organism: Secondary | ICD-10-CM

## 2019-02-06 DIAGNOSIS — Z8261 Family history of arthritis: Secondary | ICD-10-CM | POA: Diagnosis not present

## 2019-02-06 DIAGNOSIS — E1140 Type 2 diabetes mellitus with diabetic neuropathy, unspecified: Secondary | ICD-10-CM | POA: Diagnosis not present

## 2019-02-06 DIAGNOSIS — J9601 Acute respiratory failure with hypoxia: Secondary | ICD-10-CM | POA: Diagnosis present

## 2019-02-06 DIAGNOSIS — E119 Type 2 diabetes mellitus without complications: Secondary | ICD-10-CM

## 2019-02-06 DIAGNOSIS — Z833 Family history of diabetes mellitus: Secondary | ICD-10-CM | POA: Diagnosis not present

## 2019-02-06 DIAGNOSIS — J069 Acute upper respiratory infection, unspecified: Secondary | ICD-10-CM | POA: Diagnosis present

## 2019-02-06 DIAGNOSIS — I959 Hypotension, unspecified: Secondary | ICD-10-CM | POA: Diagnosis not present

## 2019-02-06 DIAGNOSIS — R0602 Shortness of breath: Secondary | ICD-10-CM | POA: Diagnosis not present

## 2019-02-06 DIAGNOSIS — E871 Hypo-osmolality and hyponatremia: Secondary | ICD-10-CM | POA: Diagnosis not present

## 2019-02-06 DIAGNOSIS — J188 Other pneumonia, unspecified organism: Secondary | ICD-10-CM | POA: Diagnosis not present

## 2019-02-06 DIAGNOSIS — M810 Age-related osteoporosis without current pathological fracture: Secondary | ICD-10-CM | POA: Diagnosis present

## 2019-02-06 DIAGNOSIS — N179 Acute kidney failure, unspecified: Secondary | ICD-10-CM

## 2019-02-06 DIAGNOSIS — Z885 Allergy status to narcotic agent status: Secondary | ICD-10-CM

## 2019-02-06 DIAGNOSIS — G894 Chronic pain syndrome: Secondary | ICD-10-CM | POA: Diagnosis not present

## 2019-02-06 DIAGNOSIS — M549 Dorsalgia, unspecified: Secondary | ICD-10-CM | POA: Diagnosis not present

## 2019-02-06 DIAGNOSIS — R531 Weakness: Secondary | ICD-10-CM | POA: Diagnosis not present

## 2019-02-06 DIAGNOSIS — Z8249 Family history of ischemic heart disease and other diseases of the circulatory system: Secondary | ICD-10-CM

## 2019-02-06 DIAGNOSIS — Z7952 Long term (current) use of systemic steroids: Secondary | ICD-10-CM

## 2019-02-06 DIAGNOSIS — I7300 Raynaud's syndrome without gangrene: Secondary | ICD-10-CM | POA: Diagnosis not present

## 2019-02-06 DIAGNOSIS — Z888 Allergy status to other drugs, medicaments and biological substances status: Secondary | ICD-10-CM

## 2019-02-06 DIAGNOSIS — E7800 Pure hypercholesterolemia, unspecified: Secondary | ICD-10-CM | POA: Diagnosis not present

## 2019-02-06 DIAGNOSIS — U071 COVID-19: Secondary | ICD-10-CM | POA: Diagnosis not present

## 2019-02-06 DIAGNOSIS — J1289 Other viral pneumonia: Secondary | ICD-10-CM | POA: Diagnosis present

## 2019-02-06 DIAGNOSIS — K219 Gastro-esophageal reflux disease without esophagitis: Secondary | ICD-10-CM | POA: Diagnosis not present

## 2019-02-06 DIAGNOSIS — Z7982 Long term (current) use of aspirin: Secondary | ICD-10-CM

## 2019-02-06 DIAGNOSIS — Z808 Family history of malignant neoplasm of other organs or systems: Secondary | ICD-10-CM

## 2019-02-06 DIAGNOSIS — E785 Hyperlipidemia, unspecified: Secondary | ICD-10-CM | POA: Diagnosis present

## 2019-02-06 DIAGNOSIS — Z800 Family history of malignant neoplasm of digestive organs: Secondary | ICD-10-CM

## 2019-02-06 DIAGNOSIS — Z882 Allergy status to sulfonamides status: Secondary | ICD-10-CM

## 2019-02-06 DIAGNOSIS — J45909 Unspecified asthma, uncomplicated: Secondary | ICD-10-CM | POA: Diagnosis not present

## 2019-02-06 LAB — LACTIC ACID, PLASMA
Lactic Acid, Venous: 1.3 mmol/L (ref 0.5–1.9)
Lactic Acid, Venous: 2.3 mmol/L (ref 0.5–1.9)

## 2019-02-06 LAB — GLUCOSE, CAPILLARY: Glucose-Capillary: 103 mg/dL — ABNORMAL HIGH (ref 70–99)

## 2019-02-06 LAB — CBC WITH DIFFERENTIAL/PLATELET
Abs Immature Granulocytes: 0.19 10*3/uL — ABNORMAL HIGH (ref 0.00–0.07)
Basophils Absolute: 0.1 10*3/uL (ref 0.0–0.1)
Basophils Relative: 0 %
Eosinophils Absolute: 0.1 10*3/uL (ref 0.0–0.5)
Eosinophils Relative: 1 %
HCT: 37.7 % (ref 36.0–46.0)
Hemoglobin: 12.1 g/dL (ref 12.0–15.0)
Immature Granulocytes: 1 %
Lymphocytes Relative: 9 %
Lymphs Abs: 1.3 10*3/uL (ref 0.7–4.0)
MCH: 32.1 pg (ref 26.0–34.0)
MCHC: 32.1 g/dL (ref 30.0–36.0)
MCV: 100 fL (ref 80.0–100.0)
Monocytes Absolute: 0.3 10*3/uL (ref 0.1–1.0)
Monocytes Relative: 2 %
Neutro Abs: 12 10*3/uL — ABNORMAL HIGH (ref 1.7–7.7)
Neutrophils Relative %: 87 %
Platelets: 163 10*3/uL (ref 150–400)
RBC: 3.77 MIL/uL — ABNORMAL LOW (ref 3.87–5.11)
RDW: 13.2 % (ref 11.5–15.5)
WBC: 14 10*3/uL — ABNORMAL HIGH (ref 4.0–10.5)
nRBC: 0 % (ref 0.0–0.2)

## 2019-02-06 LAB — COMPREHENSIVE METABOLIC PANEL
ALT: 42 U/L (ref 0–44)
AST: 38 U/L (ref 15–41)
Albumin: 3.4 g/dL — ABNORMAL LOW (ref 3.5–5.0)
Alkaline Phosphatase: 53 U/L (ref 38–126)
Anion gap: 10 (ref 5–15)
BUN: 23 mg/dL (ref 8–23)
CO2: 19 mmol/L — ABNORMAL LOW (ref 22–32)
Calcium: 8.4 mg/dL — ABNORMAL LOW (ref 8.9–10.3)
Chloride: 97 mmol/L — ABNORMAL LOW (ref 98–111)
Creatinine, Ser: 1.12 mg/dL — ABNORMAL HIGH (ref 0.44–1.00)
GFR calc Af Amer: 58 mL/min — ABNORMAL LOW (ref >60)
GFR calc non Af Amer: 50 mL/min — ABNORMAL LOW (ref >60)
Glucose, Bld: 114 mg/dL — ABNORMAL HIGH (ref 70–99)
Potassium: 4 mmol/L (ref 3.5–5.1)
Sodium: 126 mmol/L — ABNORMAL LOW (ref 135–145)
Total Bilirubin: 0.7 mg/dL (ref 0.3–1.2)
Total Protein: 6.7 g/dL (ref 6.5–8.1)

## 2019-02-06 LAB — TRIGLYCERIDES: Triglycerides: 289 mg/dL — ABNORMAL HIGH (ref <150)

## 2019-02-06 LAB — C-REACTIVE PROTEIN: CRP: 21.5 mg/dL — ABNORMAL HIGH (ref <1.0)

## 2019-02-06 LAB — D-DIMER, QUANTITATIVE: D-Dimer, Quant: 1.05 ug/mL-FEU — ABNORMAL HIGH (ref 0.00–0.50)

## 2019-02-06 LAB — LACTATE DEHYDROGENASE: LDH: 290 U/L — ABNORMAL HIGH (ref 98–192)

## 2019-02-06 LAB — PROCALCITONIN: Procalcitonin: 0.27 ng/mL

## 2019-02-06 LAB — FIBRINOGEN: Fibrinogen: 773 mg/dL — ABNORMAL HIGH (ref 210–475)

## 2019-02-06 LAB — FERRITIN: Ferritin: 473 ng/mL — ABNORMAL HIGH (ref 11–307)

## 2019-02-06 MED ORDER — SODIUM CHLORIDE 0.9 % IV SOLN
200.0000 mg | Freq: Once | INTRAVENOUS | Status: AC
  Administered 2019-02-07: 200 mg via INTRAVENOUS
  Filled 2019-02-06: qty 40

## 2019-02-06 MED ORDER — ONDANSETRON HCL 4 MG PO TABS: 4.0000 mg | ORAL_TABLET | Freq: Four times a day (QID) | ORAL | Status: DC | PRN

## 2019-02-06 MED ORDER — LOSARTAN POTASSIUM 50 MG PO TABS
100.0000 mg | ORAL_TABLET | Freq: Every day | ORAL | Status: DC
  Administered 2019-02-07 – 2019-02-10 (×4): 100 mg via ORAL
  Filled 2019-02-06 (×5): qty 2

## 2019-02-06 MED ORDER — HYDROCOD POLST-CPM POLST ER 10-8 MG/5ML PO SUER
5.0000 mL | Freq: Two times a day (BID) | ORAL | Status: DC | PRN
  Administered 2019-02-08 (×2): 5 mL via ORAL
  Filled 2019-02-06 (×2): qty 5

## 2019-02-06 MED ORDER — ASPIRIN 81 MG PO CHEW
81.0000 mg | CHEWABLE_TABLET | Freq: Every day | ORAL | Status: DC
  Administered 2019-02-07 – 2019-02-10 (×4): 81 mg via ORAL
  Filled 2019-02-06 (×4): qty 1

## 2019-02-06 MED ORDER — ALBUTEROL SULFATE HFA 108 (90 BASE) MCG/ACT IN AERS
2.0000 | INHALATION_SPRAY | Freq: Four times a day (QID) | RESPIRATORY_TRACT | Status: DC | PRN
  Administered 2019-02-07 – 2019-02-08 (×4): 2 via RESPIRATORY_TRACT
  Filled 2019-02-06: qty 6.7

## 2019-02-06 MED ORDER — POLYETHYLENE GLYCOL 3350 17 G PO PACK: 17.0000 g | PACK | Freq: Every day | ORAL | Status: DC | PRN

## 2019-02-06 MED ORDER — METOPROLOL SUCCINATE ER 25 MG PO TB24
50.0000 mg | ORAL_TABLET | Freq: Every day | ORAL | Status: DC
  Administered 2019-02-07 – 2019-02-10 (×4): 50 mg via ORAL
  Filled 2019-02-06 (×5): qty 2

## 2019-02-06 MED ORDER — SODIUM CHLORIDE 0.9 % IV BOLUS
1000.0000 mL | Freq: Once | INTRAVENOUS | Status: AC
  Administered 2019-02-06: 1000 mL via INTRAVENOUS

## 2019-02-06 MED ORDER — PANTOPRAZOLE SODIUM 40 MG PO TBEC
40.0000 mg | DELAYED_RELEASE_TABLET | Freq: Every day | ORAL | Status: DC
  Administered 2019-02-07 – 2019-02-10 (×4): 40 mg via ORAL
  Filled 2019-02-06 (×4): qty 1

## 2019-02-06 MED ORDER — CYCLOSPORINE 0.05 % OP EMUL
1.0000 [drp] | Freq: Two times a day (BID) | OPHTHALMIC | Status: DC
  Administered 2019-02-07 – 2019-02-10 (×8): 1 [drp] via OPHTHALMIC
  Filled 2019-02-06 (×10): qty 30

## 2019-02-06 MED ORDER — SODIUM CHLORIDE 0.9 % IV SOLN
100.0000 mg | INTRAVENOUS | Status: AC
  Administered 2019-02-07 – 2019-02-10 (×4): 100 mg via INTRAVENOUS
  Filled 2019-02-06 (×5): qty 20

## 2019-02-06 MED ORDER — POTASSIUM CHLORIDE CRYS ER 20 MEQ PO TBCR
20.0000 meq | EXTENDED_RELEASE_TABLET | Freq: Every day | ORAL | Status: DC
  Administered 2019-02-07 – 2019-02-10 (×4): 20 meq via ORAL
  Filled 2019-02-06 (×4): qty 1

## 2019-02-06 MED ORDER — NORTRIPTYLINE HCL 25 MG PO CAPS
25.0000 mg | ORAL_CAPSULE | Freq: Every day | ORAL | Status: DC
  Administered 2019-02-07 – 2019-02-09 (×4): 25 mg via ORAL
  Filled 2019-02-06 (×5): qty 1

## 2019-02-06 MED ORDER — LORATADINE 10 MG PO TABS
10.0000 mg | ORAL_TABLET | Freq: Every day | ORAL | Status: DC
  Administered 2019-02-07 – 2019-02-10 (×4): 10 mg via ORAL
  Filled 2019-02-06 (×3): qty 1

## 2019-02-06 MED ORDER — VITAMIN C 500 MG PO TABS
500.0000 mg | ORAL_TABLET | Freq: Every day | ORAL | Status: DC
  Administered 2019-02-07 – 2019-02-10 (×4): 500 mg via ORAL
  Filled 2019-02-06 (×4): qty 1

## 2019-02-06 MED ORDER — ONDANSETRON HCL 4 MG/2ML IJ SOLN: 4.0000 mg | Freq: Four times a day (QID) | INTRAMUSCULAR | Status: DC | PRN

## 2019-02-06 MED ORDER — HYDROCODONE-ACETAMINOPHEN 5-325 MG PO TABS
1.0000 | ORAL_TABLET | ORAL | Status: DC | PRN
  Administered 2019-02-07 – 2019-02-09 (×6): 1 via ORAL
  Filled 2019-02-06 (×6): qty 1

## 2019-02-06 MED ORDER — DEXAMETHASONE 6 MG PO TABS
6.0000 mg | ORAL_TABLET | Freq: Every day | ORAL | Status: DC
  Administered 2019-02-06: 6 mg via ORAL
  Filled 2019-02-06: qty 2

## 2019-02-06 MED ORDER — SODIUM CHLORIDE 0.9 % IV SOLN
500.0000 mg | INTRAVENOUS | Status: DC
  Administered 2019-02-06: 500 mg via INTRAVENOUS
  Filled 2019-02-06 (×2): qty 500

## 2019-02-06 MED ORDER — INSULIN ASPART 100 UNIT/ML ~~LOC~~ SOLN
0.0000 [IU] | SUBCUTANEOUS | Status: DC
  Administered 2019-02-07: 3 [IU] via SUBCUTANEOUS

## 2019-02-06 MED ORDER — SODIUM CHLORIDE 0.9 % IV SOLN
1.0000 g | INTRAVENOUS | Status: DC
  Administered 2019-02-06: 1 g via INTRAVENOUS
  Filled 2019-02-06: qty 10

## 2019-02-06 MED ORDER — GABAPENTIN 300 MG PO CAPS
300.0000 mg | ORAL_CAPSULE | Freq: Four times a day (QID) | ORAL | Status: DC
  Administered 2019-02-07 – 2019-02-10 (×14): 300 mg via ORAL
  Filled 2019-02-06 (×13): qty 1

## 2019-02-06 MED ORDER — ACETAMINOPHEN 325 MG PO TABS
650.0000 mg | ORAL_TABLET | Freq: Once | ORAL | Status: AC
  Administered 2019-02-06: 650 mg via ORAL
  Filled 2019-02-06: qty 2

## 2019-02-06 MED ORDER — GUAIFENESIN-DM 100-10 MG/5ML PO SYRP: 10.0000 mL | ORAL_SOLUTION | ORAL | Status: DC | PRN

## 2019-02-06 MED ORDER — INSULIN NPH (HUMAN) (ISOPHANE) 100 UNIT/ML ~~LOC~~ SUSP
10.0000 [IU] | Freq: Every day | SUBCUTANEOUS | Status: DC
  Administered 2019-02-07: 10 [IU] via SUBCUTANEOUS
  Filled 2019-02-06: qty 10

## 2019-02-06 MED ORDER — ZINC SULFATE 220 (50 ZN) MG PO CAPS
220.0000 mg | ORAL_CAPSULE | Freq: Every day | ORAL | Status: DC
  Administered 2019-02-07 – 2019-02-10 (×4): 220 mg via ORAL
  Filled 2019-02-06 (×4): qty 1

## 2019-02-06 MED ORDER — TOPIRAMATE 100 MG PO TABS
200.0000 mg | ORAL_TABLET | Freq: Every day | ORAL | Status: DC
  Administered 2019-02-07 – 2019-02-09 (×4): 200 mg via ORAL
  Filled 2019-02-06 (×5): qty 2

## 2019-02-06 MED ORDER — SPIRONOLACTONE 25 MG PO TABS
25.0000 mg | ORAL_TABLET | Freq: Every day | ORAL | Status: DC
  Administered 2019-02-07 – 2019-02-10 (×4): 25 mg via ORAL
  Filled 2019-02-06 (×5): qty 1

## 2019-02-06 MED ORDER — ENOXAPARIN SODIUM 40 MG/0.4ML ~~LOC~~ SOLN
40.0000 mg | SUBCUTANEOUS | Status: DC
  Administered 2019-02-06 – 2019-02-09 (×4): 40 mg via SUBCUTANEOUS
  Filled 2019-02-06 (×4): qty 0.4

## 2019-02-06 NOTE — Progress Notes (Signed)
Pharmacy Note - Remdesivir Dosing  O:  ALT: 42  CXR: There is heterogeneous bilateral airspace opacity, most conspicuous in the right upper lobe. Findings are consistent with reported COVID-19 infection.  Requiring supplemental O2: 6L [place]   A/P:  Patient meets criteria for remdesivir.  Begin remdesivir 200 mg IV x 1, followed by 100 mg IV daily x 4 days  Monitor ALT, clinical progress  Despina Pole, Pharm. D. Clinical Pharmacist 02/06/2019 9:57 PM

## 2019-02-06 NOTE — H&P (Addendum)
History and Physical  ARINA CHECCHI [id number] DOB: [birth year] DOA: 02/06/2019  Referring physician: Nuala Alpha, PA-C, ED provider PCP: [name], MD  Outpatient Specialists:  Patient Coming From: home  Chief Complaint: SOB, weakness, cough  HPI: DAGNY DEEG is a 68 y.o. female with a history of asthma, diabetes (diet-controlled), GERD, hypertension, psoriatic arthritis with degenerative disc disease and chronic pain, neuropathy.  Patient seen for shortness of breath and known positive Covid.  She went to visit family in [place] a few weeks ago and all of them have been diagnosed with Covid.  She started developing cough and shortness of breath 2 weeks ago and was prescribed prednisone and doxy.  She completed 5-day taper of prednisone but only took 2 to 3 days of the doxycycline, which she stopped due to diarrhea.  She had a Covid test on 10/30, which returned positive.  Although she started to feel better, she began to have fevers 2 days ago with worsening nonproductive cough and shortness of breath.  She is unable to take a full breath.  She does have body aches.  Emergency Department Course: Patient initially hypoxic to 79% on room air.  She was immediately started on nasal cannula with improvement.  Her white count is 14 with a procalcitonin of 0.27 and a chest x-ray that has disease with predominance in the right upper lobe.  She was started on Rocephin and azithromycin in my request.  Review of Systems:   Pt denies any nausea, vomiting, diarrhea, constipation, abdominal pain, palpitations, headache, vision changes, lightheadedness, dizziness, melena, rectal bleeding.  Review of systems are otherwise negative  Past Medical History:  Diagnosis Date   Asthma    Albuterol in haler prn   Cataract    immature unsure which eye   Chronic back pain    Degenerative disk disease    psoriatic   Diabetes mellitus without complication (HCC)    diet controlled    Esophageal motility disorder    Non-specific, see modified barium study/speech path, BP   GERD (gastroesophageal reflux disease)    takes Protonix bid   History of blood transfusion    post c-section   History of bronchitis    HTN (hypertension)    Hyperlipidemia    takes Pravastatin daily   Lymphocytic colitis 05/26/2010   Responded to Entocort x 3 MOS   Neuropathy    Osteoporosis    gets Boniva every 3 months   Peripheral edema    takes HCTZ daily   PONV (postoperative nausea and vomiting)    Psoriatic arthritis ([place])    Dr. Katherina Right   Raynaud's disease    Seasonal allergies    SUI (stress urinary incontinence, female)    Urinary urgency    Past Surgical History:  Procedure Laterality Date   BIOPSY  04/17/2016   Procedure: BIOPSY;  Surgeon: Danie Binder, MD;  [facility];  Service: Endoscopy;;  random colon bx's   CERVICAL SPINE SURGERY  09/09/2017   CESAREAN SECTION  1974, 1978       COLONOSCOPY  06/19/2008   [id number] internal hemorrhoids/mild sigmoin colon diverticulosis   COLONOSCOPY  2012   Dr. Gala Romney: normal rectum, diverticula, lymphocytic colitis    COLONOSCOPY WITH PROPOFOL N/A 04/17/2016   Procedure: COLONOSCOPY WITH PROPOFOL;  Surgeon: Danie Binder, MD;  Location: [facility];  Service: Endoscopy;  Laterality: N/A;  [place]   spontaneous abortion  ESOPHAGOGASTRODUODENOSCOPY  06/19/2008   [id number] gastritis   LUMBAR DISC ARTHROPLASTY  7/98   LUMBAR FUSION  6/03, 11/08, 11/14   L4-5 fusion, L2-3 fusion, L1-2   ODONTOID SCREW INSERTION N/A 01/27/2018   Procedure: ODONTOID SCREW INSERTION;  Surgeon: Newman Pies, MD;  Location: [facility];  Service: Neurosurgery;  Laterality: N/A;  ODONTOID SCREW INSERTION   TONSILLECTOMY AND ADENOIDECTOMY     TUBAL LIGATION     Social History:  reports that she has never smoked. She has never used smokeless tobacco. She reports that she does  not drink alcohol or use drugs. Patient lives at home  Allergies  Allergen Reactions   Adalimumab Rash and Other (See Comments)    FEVER, Fast pulse   Imuran [Azathioprine Sodium] Rash    Denies Airway involvement   Methotrexate Other (See Comments)    Liver Enzyme elevation - after 1 month of use   Plaquenil [Hydroxychloroquine Sulfate] Rash    Denies airway involvement.    Sulfonamide Derivatives Rash    Occurred with Sulfasalazine. Rash only.    Ceftin [Cefuroxime Axetil] Nausea Only    Upset stomach   Morphine Nausea And Vomiting    IV morphine caused N and V  After surgery.   Has tolerated Kadian in the past     Family History  Problem Relation Age of Onset   Diabetes Mother    Pancreatitis Mother    Arthritis Mother        psoriatic    Fibromyalgia Mother    Rheumatic fever Father    Diabetes Other        grandparents   Skin cancer Other        grandfather   Hypertension Other        grandparent   Congestive Heart Failure Other        grandfather   Parkinson's disease Other        grandmother   Colon cancer Maternal Uncle    Fibromyalgia Sister    Rheum arthritis Sister    Diabetes Sister    Fibromyalgia Sister    Diabetes Sister    Hypertension Son    Colon polyps Neg Hx       Prior to Admission medications   Medication Sig Start Date End Date Taking? Authorizing Provider  aspirin 81 MG tablet Take 81 mg by mouth daily.     Yes [provider]  Calcium Carb-Cholecalciferol (CALCIUM 600 + D PO) Take 1 tablet by mouth 2 (two) times daily.   Yes [provider]  cetirizine (ZYRTEC) 10 MG tablet Take 10 mg by mouth daily as needed for allergies.   Yes [provider]  diclofenac sodium (VOLTAREN) 1 % GEL Apply 2 g topically 3 (three) times daily as needed (pain).  01/17/15  Yes [provider]  fish oil-omega-3 fatty acids 1000 MG capsule Take 2 g by mouth daily.    Yes [provider]    gabapentin (NEURONTIN) 300 MG capsule Take 300 mg by mouth 4 (four) times daily.  05/05/18  Yes [provider]  Ginger, Zingiber officinalis, (GINGER ROOT) 500 MG CAPS Take 1 capsule by mouth daily.    Yes [provider]  Glucosamine Sulfate 1000 MG CAPS Take 2 capsules (2,000 mg total) by mouth daily. 07/03/11  Yes Hensel, Jamal Collin, MD  HUMULIN N KWIKPEN 100 UNIT/ML Kiwkpen Inject 10 Units into the skin at bedtime. 05/23/16  Yes [provider]  losartan (COZAAR) 100 MG  tablet Take 100 mg by mouth daily. 12/24/18  Yes [provider]  metoprolol succinate (TOPROL-XL) 50 MG 24 hr tablet Take 1 tablet (50 mg total) by mouth daily. Take with or immediately following a meal. 07/03/11  Yes Hensel, Jamal Collin, MD  Misc Natural Products (TART CHERRY ADVANCED PO) Take 1 tablet by mouth daily. daily    Yes [provider]  Multiple Vitamin (MULTIVITAMIN) tablet Take 1 tablet by mouth daily.     Yes [provider]  nortriptyline (PAMELOR) 25 MG capsule TAKE (1) CAPSULE BY MOUTH AT BEDTIME. Patient taking differently: Take 25 mg by mouth at bedtime.  12/05/17  Yes Ofilia Neas, PA-C  omeprazole (PRILOSEC) 20 MG capsule TAKE ONE CAPSULE BY MOUTH TWICE DAILY BEFORE MEALS. 02/04/19  Yes Carlis Stable, NP  potassium chloride SA (K-DUR,KLOR-CON) 20 MEQ tablet Take 20 mEq by mouth daily.    Yes [provider]  RESTASIS MULTIDOSE 0.05 % ophthalmic emulsion Place 1 drop into both eyes 2 (two) times daily.  12/27/15  Yes [provider]  Secukinumab (COSENTYX SENSOREADY PEN) 150 MG/ML SOAJ Inject 150 mg into the skin every 14 (fourteen) days. 01/15/19  Yes Deveshwar, Abel Presto, MD  spironolactone (ALDACTONE) 25 MG tablet Take 25 mg by mouth daily.    Yes [provider]  topiramate (TOPAMAX) 200 MG tablet TAKE (1) TABLET BY MOUTH AT BEDTIME. 11/14/18  Yes Deveshwar, Abel Presto, MD  Turmeric 500 MG CAPS Take 1 capsule by mouth daily.    Yes [provider]  albuterol (PROVENTIL HFA;VENTOLIN HFA) 108 (90 BASE) MCG/ACT inhaler Inhale 2 puffs into the lungs every 6 (six) hours as needed for wheezing or shortness of breath.     [provider]  guaiFENesin-dextromethorphan (ROBITUSSIN DM) 100-10 MG/5ML syrup Take 10 mLs by mouth every 6 (six) hours as needed for cough.    [provider]  HYDROcodone-acetaminophen (NORCO/VICODIN) 5-325 MG tablet Take 1 tablet by mouth 2-3 times daily as needed for pain 01/08/19   Bo Merino, MD  hydrocortisone sodium succinate (SOLU-CORTEF) 100 MG SOLR injection Inject 100 mg into the muscle See admin instructions. Take as needed when unable to swallow prednisone tablets     [provider]  leflunomide (ARAVA) 20 MG tablet TAKE 1 TABLET BY MOUTH ONCE DAILY. 07/21/18   Bo Merino, MD  lidocaine (XYLOCAINE) 2 % solution 2 TSP  PO qac/hs PRN FOR upper ABDOMINAL OR CHEST PAIN. MAY REPEAT DOSE EVERY 4 HOURS. NO MORE THAN 8 DOSES A DAY. 05/15/17   Danie Binder, MD  [facility] VERIO test strip  03/05/18   [provider]  predniSONE (DELTASONE) 5 MG tablet TAKE ONE TABLET EVERY MORNING. 01/19/19   Bo Merino, MD  SURE COMFORT INSULIN SYRINGE 31G X 5/16" 0.3 ML MISC  05/09/18   [provider]  SURE COMFORT PEN NEEDLES 31G X 5 MM [place]  05/05/18   [provider]  Zoledronic Acid (RECLAST IV) Inject into the vein.     [provider]  Glucosamine 500 MG TABS Take 4 tablets by mouth daily.    07/03/11  [provider]  simvastatin (ZOCOR) 40 MG tablet Take 20 mg by mouth at bedtime.   11/19/18  [provider]    Physical Exam: BP (!) 107/57    Pulse 98    Temp 98.1 F (36.7 C) (Oral)    Resp 19    LMP 02/01/1999    SpO2 100%  General: Elderly female. Awake and alert and oriented x3. No acute cardiopulmonary distress.   HEENT: Normocephalic atraumatic.  Right and left ears normal in appearance.  Pupils equal, round,  reactive to light. Extraocular muscles are intact. Sclerae anicteric and noninjected.  Moist mucosal membranes. No mucosal lesions.   Neck: Neck supple without lymphadenopathy. No carotid bruits. No masses palpated.   Cardiovascular: Regular rate with normal S1-S2 sounds. No murmurs, rubs, gallops auscultated. No JVD.   Respiratory: Slight wheeze throughout.  Poor inspiratory effort.  Rales throughout.  No accessory muscle use.  Abdomen: Soft, nontender, nondistended. Active bowel sounds. No masses or hepatosplenomegaly   Skin: No rashes, lesions, or ulcerations.  Dry, warm to touch. 2+ dorsalis pedis and radial pulses.  Musculoskeletal: No calf or leg pain. All major joints not erythematous nontender.  No upper or lower joint deformation.  Good ROM.  No contractures   Psychiatric: Intact judgment and insight. Pleasant and cooperative.  Neurologic: No focal neurological deficits. Strength is 5/5 and symmetric in upper and lower extremities.  Cranial nerves II through XII are grossly intact.           Labs on Admission: I have personally reviewed following labs and imaging studies  CBC: Recent Labs  Lab 02/06/19 1711  WBC 14.0*  NEUTROABS 12.0*  HGB 12.1  HCT 37.7  MCV 100.0  PLT [id number]   Basic Metabolic Panel: Recent Labs  Lab 02/06/19 1711  NA 126*  K 4.0  CL 97*  CO2 19*  GLUCOSE 114*  BUN 23  CREATININE 1.12*  CALCIUM 8.4*   GFR: CrCl cannot be calculated (Unknown ideal weight.). Liver Function Tests: Recent Labs  Lab 02/06/19 1711  AST 38  ALT 42  ALKPHOS 53  BILITOT 0.7  PROT 6.7  ALBUMIN 3.4*   No results for input(s): LIPASE, AMYLASE in the last 168 hours. No results for input(s): AMMONIA in the last 168 hours. Coagulation Profile: No results for input(s): INR, PROTIME in the last 168 hours. Cardiac Enzymes: No results for input(s): CKTOTAL, CKMB, CKMBINDEX, TROPONINI in the last 168 hours. BNP (last 3 results) No results for input(s): PROBNP in  the last 8760 hours. HbA1C: No results for input(s): HGBA1C in the last 72 hours. CBG: No results for input(s): GLUCAP in the last 168 hours. Lipid Profile: Recent Labs    02/06/19 1712  TRIG 289*   Thyroid Function Tests: No results for input(s): TSH, T4TOTAL, FREET4, T3FREE, THYROIDAB in the last 72 hours. Anemia Panel: No results for input(s): VITAMINB12, FOLATE, FERRITIN, TIBC, IRON, RETICCTPCT in the last 72 hours. Urine analysis:    Component Value Date/Time   BILIRUBINUR n 08/12/2015 1334   PROTEINUR n 08/12/2015 1334   UROBILINOGEN negative 08/12/2015 1334   NITRITE n 08/12/2015 1334   LEUKOCYTESUR Negative 08/12/2015 1334   Sepsis Labs: @LABRCNTIP (procalcitonin:4,lacticidven:4) ) Recent Results (from the past 240 hour(s))  Novel Coronavirus, NAA ([facility])     Status: Abnormal   Collection Time: 01/30/19  1:00 PM   Specimen: Nasopharyngeal(NP) swabs in vial transport medium   NASOPHARYNGE  TESTING  Result Value Ref Range Status   SARS-CoV-2, NAA Detected (A) Not Detected Final    Comment: This nucleic acid amplification test was developed and its performance characteristics determined by [employer]. Nucleic acid amplification tests include PCR and TMA. This test has not been FDA cleared or approved. This test has been authorized by FDA under an Emergency Use Authorization (EUA). This test is only authorized for the duration  of time the declaration that circumstances exist justifying the authorization of the emergency use of in vitro diagnostic tests for detection of SARS-CoV-2 virus and/or diagnosis of COVID-19 infection under section 564(b)(1) of the Act, 21 U.S.C. [id number]) (1), unless the authorization is terminated or revoked sooner. When diagnostic testing is negative, the possibility of a false negative result should be considered in the context of a patient's recent exposures and the presence of clinical signs and symptoms consistent with  COVID-19. An individual without symptoms of COVID-19 and who is not shedding SARS-CoV-2 virus would  expect to have a negative (not detected) result in this assay.   Blood Culture (routine x 2)     Status: None (Preliminary result)   Collection Time: 02/06/19  5:11 PM   Specimen: BLOOD LEFT ARM  Result Value Ref Range Status   Specimen Description BLOOD LEFT ARM  Final   Special Requests   Final    BOTTLES DRAWN AEROBIC AND ANAEROBIC Blood Culture adequate volume Performed at [facility], [address], [place], [place] [address]    Culture PENDING  Incomplete   Report Status PENDING  Incomplete  Blood Culture (routine x 2)     Status: None (Preliminary result)   Collection Time: 02/06/19  5:12 PM   Specimen: Right Antecubital; Blood  Result Value Ref Range Status   Specimen Description RIGHT ANTECUBITAL  Final   Special Requests   Final    BOTTLES DRAWN AEROBIC AND ANAEROBIC Blood Culture adequate volume Performed at [facility], [address] [place], [place] [address]    Culture PENDING  Incomplete   Report Status PENDING  Incomplete     Radiological Exams on Admission: Dg Chest Port 1 View  Result Date: 02/06/2019 CLINICAL DATA:  COVID-19, hypoxia, shortness of breath EXAM: PORTABLE CHEST 1 VIEW COMPARISON:  None. FINDINGS: The heart size and mediastinal contours are within normal limits. There is heterogeneous bilateral airspace opacity, most conspicuous in the right upper lobe. The visualized skeletal structures are unremarkable. IMPRESSION: There is heterogeneous bilateral airspace opacity, most conspicuous in the right upper lobe. Findings are consistent with reported COVID-19 infection. Electronically Signed   By: Eddie Candle M.D.   On: 02/06/2019 18:06    EKG: Independently reviewed.  Sinus rhythm with no ST changes  Assessment/Plan: Principal Problem:   Acute respiratory failure with hypoxia (HCC) Active Problems:   GERD   Hypertension   Hypercholesteremia    Psoriatic arthritis (HCC)   Chronic pain syndrome   Asthma   Diabetes mellitus without complication ([place])   Acute respiratory disease due to COVID-19 virus   Community acquired pneumonia   Hyponatremia    This patient was discussed with the ED physician, including pertinent vitals, physical exam findings, labs, and imaging.  We also discussed care given by the ED provider.  1. Acute respiratory failure with hypoxia a. Admit at [facility] b. Respiratory support with supplemental oxygen 2. Acute respiratory disease due to COVID-19 Daily labs: CBC, CMP, CRP, D-dimer, ferritin, magnesium, phosphorus Discussed use of steroids: Dexamethasone Discussed use of remdesivir.  Informed patient that this is experimental use as it is not yet FDA approved for the treatment of COVID-19.  Explained that this has been shown to improve outcomes with patients with chest x-ray findings and shortness of breath requiring oxygen supplementation.  Discussed potential adverse side effects such as elevated LFTs.  Patient agreeable to use of the medication. Proning Supplemental oxygen Zinc and vitamin C Albuterol HFA as needed  Antitussives 3. Community-acquired pneumonia a. Rocephin b. Azithromycin c. Repeat CBC d. Add influenza virus panel 4. Diabetes a. Sliding scale insulin and CBGs before meals and nightly 5. Hypovolemic hyponatremia a. IV fluids: 1 L over 4 hours 6. Asthma a. Dexamethasone and inhalers 7. GERD a. Home regimen 8. Hypertension a. Home regimen 9. Psoriatic arthritis with chronic pain syndrome  DVT prophylaxis: Lovenox Consultants: None Code Status: Full code Family Communication: None Disposition Plan: Patient should be able to return home following admission   Truett Mainland, DO

## 2019-02-06 NOTE — ED Notes (Signed)
Report given to [facility]. 

## 2019-02-06 NOTE — ED Provider Notes (Signed)
[facility] EMERGENCY DEPARTMENT Provider Note   CSN: [id number] Arrival date & time: 02/06/19  1628     History   Chief Complaint Chief Complaint  Patient presents with  . Fever    HPI Tonya Hall is a 68 y.o. female history diabetes, hypertension, GERD, Raynaud's, asthma.  Patient reports 2 weeks ago she developed cough and shortness of breath consistent with her known bronchitis.  She was started on prednisone taper and doxycycline.  She completed 5-day taper and only took 2-3 days worth of doxycycline and before discontinuing secondary to diarrhea.  She continued to feel unwell and last Friday, 01/30/2019 she was diagnosed with COVID-19 virus.  Patient reports that she had initially begun to feel better however 2 days ago she developed a fever up to 102 F, worsening nonproductive cough and shortness of breath.  Patient reports short of breath worsened with exertion feeling of not being to catch a full breath no alleviating factors.  Additionally she endorses generalized body aches, fatigue and nonbloody diarrhea.  EMS called today when patient feeling worse and short of breath, she reports that she was sitting watching TV when she became lightheaded and thought she was going to pass out, this resolved spontaneously.  EMS unable to measure pulse oximetry secondary to patient's resolved's disease, she was placed on 4 L nasal cannula.  Denies headache, slurred speech, neck pain/stiffness, chest pain, hemoptysis, abdominal pain, nausea/vomiting, extremity swelling/color change or any additional concerns.    HPI  Past Medical History:  Diagnosis Date  . Asthma    Albuterol in haler prn  . Cataract    immature unsure which eye  . Chronic back pain   . Degenerative disk disease    psoriatic  . Diabetes mellitus without complication (HCC)    diet controlled  . Esophageal motility disorder    Non-specific, see modified barium study/speech path, BP  . GERD (gastroesophageal  reflux disease)    takes Protonix bid  . History of blood transfusion    post c-section  . History of bronchitis   . HTN (hypertension)   . Hyperlipidemia    takes Pravastatin daily  . Lymphocytic colitis 05/26/2010   Responded to Entocort x 3 MOS  . Neuropathy   . Osteoporosis    gets Boniva every 3 months  . Peripheral edema    takes HCTZ daily  . PONV (postoperative nausea and vomiting)   . Psoriatic arthritis ([place])    Dr. Katherina Right  . Raynaud's disease   . Seasonal allergies   . SUI (stress urinary incontinence, female)   . Urinary urgency     Patient Active Problem List   Diagnosis Date Noted  . Closed nondisplaced odontoid fracture with type II morphology ([place]) 01/27/2018  . Odontoid fracture ([place]) 01/24/2018  . Chest pain 11/14/2017  . Transaminitis 06/22/2016  . Collagenous colitis 05/23/2016  . Psoriasis 05/21/2016  . High risk medication use 02/15/2016  . Age-related osteoporosis without current pathological fracture 02/15/2016  . Trochanteric bursitis, left hip 02/15/2016  . Sacroiliitis, not elsewhere classified ([place]) 02/15/2016  . Chronic pain syndrome 02/15/2016  . Abnormal weight gain 06/25/2012  . Hypertension 07/03/2011  . Hypercholesteremia 07/03/2011  . Psoriatic arthritis ([place]) 07/03/2011  . Osteoarthritis of multiple joints 07/03/2011  . Esophageal motility disorder 02/14/2011  . Cough 02/14/2011  . GERD 05/05/2010    Past Surgical History:  Procedure Laterality Date  . BIOPSY  04/17/2016   Procedure: BIOPSY;  Surgeon: Danie Binder, MD;  Location: [facility];  Service: Endoscopy;;  random colon bx's  . CERVICAL SPINE SURGERY  09/09/2017  . CESAREAN SECTION  1974, 1978      . COLONOSCOPY  06/19/2008   [id number] internal hemorrhoids/mild sigmoin colon diverticulosis  . COLONOSCOPY  2012   Dr. Gala Romney: normal rectum, diverticula, lymphocytic colitis   . COLONOSCOPY WITH PROPOFOL N/A 04/17/2016   Procedure: COLONOSCOPY WITH PROPOFOL;   Surgeon: Danie Binder, MD;  Location: [facility];  Service: Endoscopy;  Laterality: N/A;  900  . DILATION AND CURETTAGE OF UTERUS  1977   spontaneous abortion  . ESOPHAGOGASTRODUODENOSCOPY  06/19/2008   [id number] gastritis  . LUMBAR DISC ARTHROPLASTY  7/98  . LUMBAR FUSION  6/03, 11/08, 11/14   L4-5 fusion, L2-3 fusion, L1-2  . ODONTOID SCREW INSERTION N/A 01/27/2018   Procedure: ODONTOID SCREW INSERTION;  Surgeon: Newman Pies, MD;  Location: [place];  Service: Neurosurgery;  Laterality: N/A;  ODONTOID SCREW INSERTION  . TONSILLECTOMY AND ADENOIDECTOMY    . TUBAL LIGATION       OB History    Gravida  3   Para  2   Term  2   Preterm      AB  1   Living  2     SAB  0   TAB      Ectopic      Multiple      Live Births               Home Medications    Prior to Admission medications   Medication Sig Start Date End Date Taking? Authorizing Provider  aspirin 81 MG tablet Take 81 mg by mouth daily.     Yes [provider]  Calcium Carb-Cholecalciferol (CALCIUM 600 + D PO) Take 1 tablet by mouth 2 (two) times daily.   Yes [provider]  cetirizine (ZYRTEC) 10 MG tablet Take 10 mg by mouth daily as needed for allergies.   Yes [provider]  diclofenac sodium (VOLTAREN) 1 % GEL Apply 2 g topically 3 (three) times daily as needed (pain).  01/17/15  Yes [provider]  fish oil-omega-3 fatty acids 1000 MG capsule Take 2 g by mouth daily.    Yes [provider]  gabapentin (NEURONTIN) 300 MG capsule Take 300 mg by mouth 4 (four) times daily.  05/05/18  Yes [provider]  Ginger, Zingiber officinalis, (GINGER ROOT) 500 MG CAPS Take 1 capsule by mouth daily.    Yes [provider]  Glucosamine Sulfate 1000 MG CAPS Take 2 capsules (2,000 mg total) by mouth daily. 07/03/11  Yes Hensel, Jamal Collin, MD  HUMULIN N KWIKPEN 100 UNIT/ML Kiwkpen Inject 10 Units into the skin at bedtime. 05/23/16  Yes [provider]  losartan (COZAAR) 100 MG tablet Take 100 mg by mouth daily. 12/24/18  Yes [provider]  metoprolol succinate (TOPROL-XL) 50 MG 24 hr tablet Take 1 tablet (50 mg total) by mouth daily. Take with or immediately following a meal. 07/03/11  Yes Hensel, Jamal Collin, MD  Misc Natural Products (TART CHERRY ADVANCED PO) Take 1 tablet by mouth daily. daily    Yes [provider]  Multiple Vitamin (MULTIVITAMIN) tablet Take 1 tablet by mouth daily.     Yes [provider]  nortriptyline (PAMELOR) 25 MG capsule TAKE (1) CAPSULE BY MOUTH AT BEDTIME. Patient taking differently: Take 25 mg by mouth at bedtime.  12/05/17  Yes Ofilia Neas, PA-C  omeprazole (PRILOSEC) 20 MG capsule TAKE ONE CAPSULE BY MOUTH TWICE DAILY BEFORE MEALS. 02/04/19  Yes Carlis Stable, NP  potassium chloride SA (K-DUR,KLOR-CON) 20 MEQ tablet Take 20 mEq by mouth daily.    Yes [provider]  RESTASIS MULTIDOSE 0.05 % ophthalmic emulsion Place 1 drop into both eyes 2 (two) times daily.  12/27/15  Yes [provider]  Secukinumab (COSENTYX SENSOREADY PEN) 150 MG/ML SOAJ Inject 150 mg into the skin every 14 (fourteen) days. 01/15/19  Yes Deveshwar, Abel Presto, MD  spironolactone (ALDACTONE) 25 MG tablet Take 25 mg by mouth daily.    Yes [provider]  topiramate (TOPAMAX) 200 MG tablet TAKE (1) TABLET BY MOUTH AT BEDTIME. 11/14/18  Yes Deveshwar, Abel Presto, MD  Turmeric 500 MG CAPS Take 1 capsule by mouth daily.    Yes [provider]  albuterol (PROVENTIL HFA;VENTOLIN HFA) 108 (90 BASE) MCG/ACT inhaler Inhale 2 puffs into the lungs every 6 (six) hours as needed for wheezing or shortness of breath.     [provider]  guaiFENesin-dextromethorphan (ROBITUSSIN DM) 100-10 MG/5ML syrup Take 10 mLs by mouth every 6 (six) hours as needed for cough.    [provider]  HYDROcodone-acetaminophen (NORCO/VICODIN) 5-325 MG tablet Take 1 tablet by mouth 2-3 times daily  as needed for pain 01/08/19   Bo Merino, MD  hydrocortisone sodium succinate (SOLU-CORTEF) 100 MG SOLR injection Inject 100 mg into the muscle See admin instructions. Take as needed when unable to swallow prednisone tablets     [provider]  leflunomide (ARAVA) 20 MG tablet TAKE 1 TABLET BY MOUTH ONCE DAILY. 07/21/18   Bo Merino, MD  lidocaine (XYLOCAINE) 2 % solution 2 TSP  PO qac/hs PRN FOR upper ABDOMINAL OR CHEST PAIN. MAY REPEAT DOSE EVERY 4 HOURS. NO MORE THAN 8 DOSES A DAY. 05/15/17   Danie Binder, MD  [facility] VERIO test strip  03/05/18   [provider]  predniSONE (DELTASONE) 5 MG tablet TAKE ONE TABLET EVERY MORNING. 01/19/19   Bo Merino, MD  SURE COMFORT INSULIN SYRINGE 31G X 5/16" 0.3 ML MISC  05/09/18   [provider]  SURE COMFORT PEN NEEDLES 31G X 5 MM [place]  05/05/18   [provider]  Zoledronic Acid (RECLAST IV) Inject into the vein.     [provider]  Glucosamine 500 MG TABS Take 4 tablets by mouth daily.    07/03/11  [provider]  simvastatin (ZOCOR) 40 MG tablet Take 20 mg by mouth at bedtime.   11/19/18  [provider]    Family History Family History  Problem Relation Age of Onset  . Diabetes Mother   . Pancreatitis Mother   . Arthritis Mother        psoriatic   . Fibromyalgia Mother   . Rheumatic fever Father   . Diabetes Other        grandparents  . Skin cancer Other        grandfather  . Hypertension Other        grandparent  . Congestive Heart Failure Other        grandfather  . Parkinson's disease Other        grandmother  . Colon cancer Maternal Uncle   . Fibromyalgia Sister   . Rheum arthritis Sister   . Diabetes Sister   . Fibromyalgia Sister   . Diabetes Sister   . Hypertension Son   . Colon polyps Neg Hx  Social History Social History   Tobacco Use  . Smoking status: Never Smoker  . Smokeless tobacco: Never Used  Substance Use Topics  . Alcohol  use: No  . Drug use: No     Allergies   Adalimumab, Imuran [azathioprine sodium], Methotrexate, Plaquenil [hydroxychloroquine sulfate], Sulfonamide derivatives, Ceftin [cefuroxime axetil], and Morphine   Review of Systems Review of Systems Ten systems are reviewed and are negative for acute change except as noted in the HPI   Physical Exam Updated Vital Signs BP (!) 116/49 (BP Location: Right Arm)   Pulse (!) 129   Temp 98.1 F (36.7 C) (Oral)   Resp (!) 22   LMP 02/01/1999   SpO2 (!) 79%   Physical Exam Constitutional:      General: She is not in acute distress.    Appearance: Normal appearance. She is well-developed. She is not ill-appearing or diaphoretic.  HENT:     Head: Normocephalic and atraumatic.     Right Ear: External ear normal.     Left Ear: External ear normal.     Nose: Nose normal.  Eyes:     General: Vision grossly intact. Gaze aligned appropriately.     Pupils: Pupils are equal, round, and reactive to light.  Neck:     Musculoskeletal: Normal range of motion.     Trachea: Trachea and phonation normal. No tracheal deviation.  Cardiovascular:     Rate and Rhythm: Normal rate and regular rhythm.     Pulses:          Dorsalis pedis pulses are 2+ on the right side and 2+ on the left side.  Pulmonary:     Effort: Pulmonary effort is normal. Tachypnea present. No respiratory distress.     Breath sounds: Normal breath sounds and air entry.  Abdominal:     General: There is no distension.     Palpations: Abdomen is soft.     Tenderness: There is no abdominal tenderness. There is no guarding or rebound.  Musculoskeletal: Normal range of motion.     Right lower leg: Normal.     Left lower leg: Normal.  Feet:     Right foot:     Protective Sensation: 3 sites tested. 3 sites sensed.     Left foot:     Protective Sensation: 3 sites tested. 3 sites sensed.  Skin:    General: Skin is warm and dry.  Neurological:     Mental Status: She is alert.     GCS:  GCS eye subscore is 4. GCS verbal subscore is 5. GCS motor subscore is 6.     Comments: Speech is clear and goal oriented, follows commands Major Cranial nerves without deficit, no facial droop Moves extremities without ataxia, coordination intact  Psychiatric:        Behavior: Behavior normal.    ED Treatments / Results  Labs (all labs ordered are listed, but only abnormal results are displayed) Labs Reviewed  CBC WITH DIFFERENTIAL/PLATELET - Abnormal; Notable for the following components:      Result Value   WBC 14.0 (*)    RBC 3.77 (*)    Neutro Abs 12.0 (*)    Abs Immature Granulocytes 0.19 (*)    All other components within normal limits  COMPREHENSIVE METABOLIC PANEL - Abnormal; Notable for the following components:   Sodium 126 (*)    Chloride 97 (*)    CO2 19 (*)    Glucose, Bld 114 (*)    Creatinine,  Ser 1.12 (*)    Calcium 8.4 (*)    Albumin 3.4 (*)    GFR calc non Af Amer 50 (*)    GFR calc Af Amer 58 (*)    All other components within normal limits  D-DIMER, QUANTITATIVE (NOT AT [facility]) - Abnormal; Notable for the following components:   D-Dimer, Quant 1.05 (*)    All other components within normal limits  LACTATE DEHYDROGENASE - Abnormal; Notable for the following components:   LDH 290 (*)    All other components within normal limits  TRIGLYCERIDES - Abnormal; Notable for the following components:   Triglycerides 289 (*)    All other components within normal limits  FIBRINOGEN - Abnormal; Notable for the following components:   Fibrinogen 773 (*)    All other components within normal limits  CULTURE, BLOOD (ROUTINE X 2)  CULTURE, BLOOD (ROUTINE X 2)  LACTIC ACID, PLASMA  PROCALCITONIN  LACTIC ACID, PLASMA  FERRITIN  C-REACTIVE PROTEIN    EKG EKG Interpretation  Date/Time:  Friday February 06 2019 16:59:34 EST Ventricular Rate:  92 PR Interval:    QRS Duration: 100 QT Interval:  340 QTC Calculation: 421 R Axis:   55 Text Interpretation: Sinus  rhythm Low voltage, precordial leads Confirmed by Elnora Morrison [phone]) on 02/06/2019 5:13:07 PM   Radiology Dg Chest Port 1 View  Result Date: 02/06/2019 CLINICAL DATA:  COVID-19, hypoxia, shortness of breath EXAM: PORTABLE CHEST 1 VIEW COMPARISON:  None. FINDINGS: The heart size and mediastinal contours are within normal limits. There is heterogeneous bilateral airspace opacity, most conspicuous in the right upper lobe. The visualized skeletal structures are unremarkable. IMPRESSION: There is heterogeneous bilateral airspace opacity, most conspicuous in the right upper lobe. Findings are consistent with reported COVID-19 infection. Electronically Signed   By: Eddie Candle M.D.   On: 02/06/2019 18:06    Procedures Procedures (including critical care time)  Medications Ordered in ED Medications  cefTRIAXone (ROCEPHIN) 1 g in sodium chloride 0.9 % 100 mL IVPB (has no administration in time range)  azithromycin (ZITHROMAX) 500 mg in sodium chloride 0.9 % 250 mL IVPB (has no administration in time range)  dexamethasone (DECADRON) tablet 6 mg (has no administration in time range)  sodium chloride 0.9 % bolus 1,000 mL (has no administration in time range)     Initial Impression / Assessment and Plan / ED Course  I have reviewed the triage vital signs and the nursing notes.  Pertinent labs & imaging results that were available during my care of the patient were reviewed by me and considered in my medical decision making (see chart for details).    CBC with leukocytosis of 14.0 with left shift Lactic 1.3 D-dimer 1.05 Fibrinogen 773 CMP with sodium 126, creatinine 1.12 Procalcitonin 0.27 Triglycerides 289 CXR:  IMPRESSION:  There is heterogeneous bilateral airspace opacity, most conspicuous  in the right upper lobe. Findings are consistent with reported  COVID-19 infection.   EKG: Sinus rhythm Low voltage, precordial leads Confirmed by Elnora Morrison [phone]) on 02/06/2019 5:13:07 PM -  Patient with known COVID-19 viral infection, on arrival SPO2 81% on room air this improved with 4 L nasal cannula to 99%, she reports she feels much better on supplemental oxygen.  She has generalized body aches, fatigue and cough.  D-dimer is elevated however suspect this to be secondary to her Covid infection, doubt pulmonary embolism or acute cardiovascular pathologies based on clinical picture at this time.  Discussed case with hospitalist Dr. Nehemiah Settle  who has asked for 1 L IV fluids given at 250 mL/h, he is admitting the patient, he is asked that we hold off on antibiotics and he will order these on admission for possible superimposed bacterial pneumonia.  Patient was reassessed resting comfortably no acute distress, her saturation decreased a few times while here in the ED, she attributes this to her Raynaud's syndrome and reports that her fingers feel cold, each time after her fingers are warmed up her saturations again increased and she reports she is feeling improved on the 4 L of oxygen.  She states understanding of care plan and is agreeable for admission.  Patient was seen and evaluated by Dr. Reather Converse during this visit.  Tonya Hall was evaluated in Emergency Department on 02/06/2019 for the symptoms described in the history of present illness. She was evaluated in the context of the global COVID-19 pandemic, which necessitated consideration that the patient might be at risk for infection with the SARS-CoV-2 virus that causes COVID-19. Institutional protocols and algorithms that pertain to the evaluation of patients at risk for COVID-19 are in a state of rapid change based on information released by regulatory bodies including the CDC and federal and state organizations. These policies and algorithms were followed during the patient's care in the ED.   Note: Portions of this report may have been transcribed using voice recognition software. Every effort was made to ensure accuracy; however,  inadvertent computerized transcription errors may still be present. Final Clinical Impressions(s) / ED Diagnoses   Final diagnoses:  [id number] virus infection  Hypoxia  Multifocal pneumonia    ED Discharge Orders    None       Gari Crown 02/06/19 1837    Elnora Morrison, MD 02/06/19 2351

## 2019-02-06 NOTE — ED Triage Notes (Signed)
DX with w covid last Friday. Pt had been sick a week prior. Pt was feeling better than started running a fever 2 days ago and weak. Mild cough

## 2019-02-06 NOTE — Progress Notes (Signed)
Paged Dr. Loralee Pacas re: Na 129. Waiting call back. Will continue to monitor patient.

## 2019-02-06 NOTE — Progress Notes (Signed)
Paged Dr. Loralee Pacas again re: Na 129. Waiting call back. Will continue to monitor patient.

## 2019-02-07 DIAGNOSIS — N179 Acute kidney failure, unspecified: Secondary | ICD-10-CM

## 2019-02-07 DIAGNOSIS — E119 Type 2 diabetes mellitus without complications: Secondary | ICD-10-CM

## 2019-02-07 DIAGNOSIS — J069 Acute upper respiratory infection, unspecified: Secondary | ICD-10-CM

## 2019-02-07 DIAGNOSIS — I10 Essential (primary) hypertension: Secondary | ICD-10-CM

## 2019-02-07 DIAGNOSIS — G894 Chronic pain syndrome: Secondary | ICD-10-CM

## 2019-02-07 DIAGNOSIS — U071 COVID-19: Principal | ICD-10-CM

## 2019-02-07 LAB — CBC WITH DIFFERENTIAL/PLATELET
Abs Immature Granulocytes: 0.14 10*3/uL — ABNORMAL HIGH (ref 0.00–0.07)
Basophils Absolute: 0 10*3/uL (ref 0.0–0.1)
Basophils Relative: 0 %
Eosinophils Absolute: 0 10*3/uL (ref 0.0–0.5)
Eosinophils Relative: 0 %
HCT: 35.7 % — ABNORMAL LOW (ref 36.0–46.0)
Hemoglobin: 11.4 g/dL — ABNORMAL LOW (ref 12.0–15.0)
Immature Granulocytes: 2 %
Lymphocytes Relative: 7 %
Lymphs Abs: 0.7 10*3/uL (ref 0.7–4.0)
MCH: 31.6 pg (ref 26.0–34.0)
MCHC: 31.9 g/dL (ref 30.0–36.0)
MCV: 98.9 fL (ref 80.0–100.0)
Monocytes Absolute: 0.1 10*3/uL (ref 0.1–1.0)
Monocytes Relative: 1 %
Neutro Abs: 8.5 10*3/uL — ABNORMAL HIGH (ref 1.7–7.7)
Neutrophils Relative %: 90 %
Platelets: 163 10*3/uL (ref 150–400)
RBC: 3.61 MIL/uL — ABNORMAL LOW (ref 3.87–5.11)
RDW: 12.9 % (ref 11.5–15.5)
WBC: 9.5 10*3/uL (ref 4.0–10.5)
nRBC: 0 % (ref 0.0–0.2)

## 2019-02-07 LAB — GLUCOSE, CAPILLARY
Glucose-Capillary: 133 mg/dL — ABNORMAL HIGH (ref 70–99)
Glucose-Capillary: 135 mg/dL — ABNORMAL HIGH (ref 70–99)
Glucose-Capillary: 148 mg/dL — ABNORMAL HIGH (ref 70–99)
Glucose-Capillary: 183 mg/dL — ABNORMAL HIGH (ref 70–99)
Glucose-Capillary: 221 mg/dL — ABNORMAL HIGH (ref 70–99)

## 2019-02-07 LAB — HEMOGLOBIN A1C
Hgb A1c MFr Bld: 6.4 % — ABNORMAL HIGH (ref 4.8–5.6)
Mean Plasma Glucose: 136.98 mg/dL

## 2019-02-07 LAB — COMPREHENSIVE METABOLIC PANEL
ALT: 36 U/L (ref 0–44)
AST: 30 U/L (ref 15–41)
Albumin: 3 g/dL — ABNORMAL LOW (ref 3.5–5.0)
Alkaline Phosphatase: 47 U/L (ref 38–126)
Anion gap: 12 (ref 5–15)
BUN: 21 mg/dL (ref 8–23)
CO2: 20 mmol/L — ABNORMAL LOW (ref 22–32)
Calcium: 8.4 mg/dL — ABNORMAL LOW (ref 8.9–10.3)
Chloride: 100 mmol/L (ref 98–111)
Creatinine, Ser: 0.93 mg/dL (ref 0.44–1.00)
GFR calc Af Amer: >60 mL/min (ref >60)
GFR calc non Af Amer: >60 mL/min (ref >60)
Glucose, Bld: 160 mg/dL — ABNORMAL HIGH (ref 70–99)
Potassium: 4.4 mmol/L (ref 3.5–5.1)
Sodium: 132 mmol/L — ABNORMAL LOW (ref 135–145)
Total Bilirubin: 0.2 mg/dL — ABNORMAL LOW (ref 0.3–1.2)
Total Protein: 6.3 g/dL — ABNORMAL LOW (ref 6.5–8.1)

## 2019-02-07 LAB — D-DIMER, QUANTITATIVE: D-Dimer, Quant: 0.96 ug/mL-FEU — ABNORMAL HIGH (ref 0.00–0.50)

## 2019-02-07 LAB — C-REACTIVE PROTEIN: CRP: 26.2 mg/dL — ABNORMAL HIGH (ref <1.0)

## 2019-02-07 LAB — MAGNESIUM: Magnesium: 2 mg/dL (ref 1.7–2.4)

## 2019-02-07 LAB — FERRITIN: Ferritin: 560 ng/mL — ABNORMAL HIGH (ref 11–307)

## 2019-02-07 LAB — ABO/RH: ABO/RH(D): AB POS

## 2019-02-07 LAB — PHOSPHORUS: Phosphorus: 4 mg/dL (ref 2.5–4.6)

## 2019-02-07 LAB — HIV ANTIBODY (ROUTINE TESTING W REFLEX): HIV Screen 4th Generation wRfx: NONREACTIVE

## 2019-02-07 MED ORDER — INSULIN ASPART 100 UNIT/ML ~~LOC~~ SOLN
0.0000 [IU] | Freq: Every day | SUBCUTANEOUS | Status: DC
  Administered 2019-02-07 – 2019-02-09 (×3): 2 [IU] via SUBCUTANEOUS

## 2019-02-07 MED ORDER — SODIUM CHLORIDE 0.9 % IV SOLN
1.0000 g | INTRAVENOUS | Status: DC
  Administered 2019-02-07 – 2019-02-09 (×3): 1 g via INTRAVENOUS
  Filled 2019-02-07: qty 1
  Filled 2019-02-07: qty 10
  Filled 2019-02-07: qty 1

## 2019-02-07 MED ORDER — INSULIN ASPART 100 UNIT/ML ~~LOC~~ SOLN
4.0000 [IU] | Freq: Three times a day (TID) | SUBCUTANEOUS | Status: DC
  Administered 2019-02-07 – 2019-02-09 (×9): 4 [IU] via SUBCUTANEOUS

## 2019-02-07 MED ORDER — INSULIN DETEMIR 100 UNIT/ML ~~LOC~~ SOLN
10.0000 [IU] | Freq: Two times a day (BID) | SUBCUTANEOUS | Status: DC
  Administered 2019-02-07 – 2019-02-10 (×7): 10 [IU] via SUBCUTANEOUS
  Filled 2019-02-07 (×7): qty 0.1

## 2019-02-07 MED ORDER — LEFLUNOMIDE 20 MG PO TABS
20.0000 mg | ORAL_TABLET | Freq: Every day | ORAL | Status: DC
  Administered 2019-02-07 – 2019-02-10 (×4): 20 mg via ORAL
  Filled 2019-02-07 (×5): qty 1

## 2019-02-07 MED ORDER — SODIUM CHLORIDE 0.9 % IV BOLUS
1000.0000 mL | Freq: Once | INTRAVENOUS | Status: AC
  Administered 2019-02-07: 1000 mL via INTRAVENOUS

## 2019-02-07 MED ORDER — LIP MEDEX EX OINT
TOPICAL_OINTMENT | CUTANEOUS | Status: DC | PRN
  Administered 2019-02-07: 19:00:00 via TOPICAL
  Filled 2019-02-07: qty 7

## 2019-02-07 MED ORDER — SODIUM CHLORIDE 0.9 % IV SOLN
500.0000 mg | INTRAVENOUS | Status: DC
  Administered 2019-02-07 – 2019-02-09 (×3): 500 mg via INTRAVENOUS
  Filled 2019-02-07 (×3): qty 500

## 2019-02-07 MED ORDER — DEXAMETHASONE 6 MG PO TABS
6.0000 mg | ORAL_TABLET | Freq: Two times a day (BID) | ORAL | Status: DC
  Administered 2019-02-07 – 2019-02-08 (×3): 6 mg via ORAL
  Filled 2019-02-07 (×3): qty 1

## 2019-02-07 MED ORDER — INSULIN ASPART 100 UNIT/ML ~~LOC~~ SOLN
0.0000 [IU] | Freq: Three times a day (TID) | SUBCUTANEOUS | Status: DC
  Administered 2019-02-07: 2 [IU] via SUBCUTANEOUS
  Administered 2019-02-07: 5 [IU] via SUBCUTANEOUS
  Administered 2019-02-07: 3 [IU] via SUBCUTANEOUS
  Administered 2019-02-08: 2 [IU] via SUBCUTANEOUS
  Administered 2019-02-08 – 2019-02-09 (×3): 5 [IU] via SUBCUTANEOUS
  Administered 2019-02-09: 8 [IU] via SUBCUTANEOUS
  Administered 2019-02-09: 5 [IU] via SUBCUTANEOUS

## 2019-02-07 MED ORDER — SODIUM CHLORIDE 0.9 % IV SOLN
INTRAVENOUS | Status: AC
  Administered 2019-02-07 (×2): via INTRAVENOUS

## 2019-02-07 NOTE — Progress Notes (Signed)
Called and updated spouse, Cornelia Copa, on patient's condition.  All questions and concerns addressed.  Earleen Reaper RN

## 2019-02-07 NOTE — Evaluation (Signed)
Occupational Therapy Evaluation Patient Details Name: Tonya Hall MRN: [id number] DOB: 03/08/1951 Today's Date: 02/07/2019    History of Present Illness 68 y/o female w/ hx of SUI, Raynaud's disease, psoriatic arthritis, peripheral edema, osteoporosis, neuropathy, lymphocytic colitis, HLD, bronchitis, GERD, esophageal motility disorder, DM, DDD, chronic back pain, asthma. Lumbar fusion, lumbar disc arthroplasty, cervical spine surgery. Dx COVID 10/30 and developed SOB. Presented to ED 11/6 w/ fever and hypoxia.   Clinical Impression   Pt admitted with above diagnoses, presenting with compromised cardiopulmonary status with generalized weakness limiting ability to engage in BADL at desired level of independence. PTA pt independent- mod I with BADL/IADL. She is able to complete transfers and functional mobility at min guard level without use of AD. Pt on 2L [place] with VSS throughout session, minor complaints of DOE. Pt able to complete functional mobility into BR in room to complete toileting and standing grooming at min guard level. One minor LOB noted on way out of BR, pt fatigues easily and endorses feeling weak. Continue to recommend acute level OT to progress safety and independence and BADL prior to d/c home. No f/u OT recommended post acute.     Follow Up Recommendations  No OT follow up;Supervision - Intermittent    Equipment Recommendations  None recommended by OT    Recommendations for Other Services       Precautions / Restrictions Precautions Precautions: Fall Precaution Comments: hx of several back surgeries Restrictions Weight Bearing Restrictions: No      Mobility Bed Mobility               General bed mobility comments: up in chair  Transfers Overall transfer level: Needs assistance Equipment used: None Transfers: Sit to/from Stand Sit to Stand: Min guard         General transfer comment: for safety and line management    Balance Overall balance  assessment: Needs assistance Sitting-balance support: Feet supported Sitting balance-Leahy Scale: Good     Standing balance support: During functional activity;No upper extremity supported Standing balance-Leahy Scale: Fair Standing balance comment: able to stand at sink to perform grooming tasks without LOB in static standing. x1 minor LOB when leaving bathroom                           ADL either performed or assessed with clinical judgement   ADL Overall ADL's : Needs assistance/impaired Eating/Feeding: Set up;Sitting   Grooming: Supervision/safety;Standing;Brushing hair;Wash/dry hands   Upper Body Bathing: Set up;Sitting   Lower Body Bathing: Min guard;Sitting/lateral leans;Sit to/from stand   Upper Body Dressing : Set up;Sitting   Lower Body Dressing: Min guard;Sitting/lateral leans;Sit to/from stand   Toilet Transfer: Public affairs consultant Details (indicate cue type and reason): regular toilet in bathroom Toileting- Clothing Manipulation and Hygiene: Modified independent;Sitting/lateral lean   Tub/ Shower Transfer: Min guard;Shower seat;Ambulation   Functional mobility during ADLs: Min guard General ADL Comments: pt mostly limited from decreased activity tolerance and generalized weakness     Vision Baseline Vision/History: Wears glasses Wears Glasses: At all times Patient Visual Report: No change from baseline       Perception     Praxis      Pertinent Vitals/Pain Pain Assessment: No/denies pain     Hand Dominance     Extremity/Trunk Assessment Upper Extremity Assessment Upper Extremity Assessment: Generalized weakness   Lower Extremity Assessment Lower Extremity Assessment: Defer to PT evaluation  Communication Communication Communication: No difficulties   Cognition Arousal/Alertness: Awake/alert Behavior During Therapy: WFL for tasks assessed/performed Overall Cognitive Status: Within Functional  Limits for tasks assessed                                     General Comments       Exercises     Shoulder Instructions      Home Living Family/patient expects to be discharged to:: Private residence Living Arrangements: Spouse/significant other Available Help at Discharge: Family Type of Home: House Home Access: Stairs to enter Technical brewer of Steps: 3 Entrance Stairs-Rails: Can reach both [place]: Two level;Able to live on main level with bedroom/bathroom     Bathroom Shower/Tub: Walk-in shower;Other (comment)(hanicap accessible)   Bathroom Toilet: Standard Bathroom Accessibility: Yes How Accessible: Accessible via walker Home Equipment: Shower seat - built in;Walker - 2 wheels;Shower seat;Cane - single point;Wheelchair - manual;Bedside commode          Prior Functioning/Environment Level of Independence: Independent        Comments: retired Therapist, sports, active and independent in [employer]        OT Problem List: Decreased strength;Decreased knowledge of use of DME or AE;Decreased activity tolerance;Cardiopulmonary status limiting activity;Impaired balance (sitting and/or standing)      OT Treatment/Interventions: Self-care/ADL training;Therapeutic exercise;Patient/family education;Balance training;Energy conservation;Therapeutic activities;DME and/or AE instruction    OT Goals(Current goals can be found in the care plan section) Acute Rehab OT Goals Patient Stated Goal: be healthy again OT Goal Formulation: With patient Time For Goal Achievement: 02/21/19 Potential to Achieve Goals: Good  OT Frequency: Min 2X/week   Barriers to D/C:            Co-evaluation              AM-PAC OT "6 Clicks" Daily Activity     Outcome Measure Help from another person eating meals?: A Little Help from another person taking care of personal grooming?: A Little Help from another person toileting, which includes using toliet, bedpan, or urinal?:  A Little Help from another person bathing (including washing, rinsing, drying)?: A Little Help from another person to put on and taking off regular upper body clothing?: A Little Help from another person to put on and taking off regular lower body clothing?: A Little 6 Click Score: 18   End of Session Equipment Utilized During Treatment: Oxygen Nurse Communication: Mobility status  Activity Tolerance: Patient tolerated treatment well Patient left: in chair;with call bell/phone within reach  OT Visit Diagnosis: Unsteadiness on feet (R26.81);Other abnormalities of gait and mobility (R26.89);Muscle weakness (generalized) (M62.81)                Time: [id number] OT Time Calculation (min): 29 min Charges:  OT General Charges $OT Visit: 1 Visit OT Evaluation $OT Eval Low Complexity: 1 Low OT Treatments $Self Care/Home Management : 8-22 mins  Zenovia Jarred, MSOT, OTR/L Behavioral Health OT/ Acute Relief OT [facility] Office: [phone]   Zenovia Jarred 02/07/2019, 4:20 PM

## 2019-02-07 NOTE — Evaluation (Signed)
Physical Therapy Evaluation Patient Details Name: Tonya Hall MRN: [id number] DOB: [birth year] Today's Date: 02/07/2019   History of Present Illness  68 y/o female w/ hx of SUI, Raynaud's disease, psoriatic arthritis, peripheral edema, osteoporosis, neuropathy, lymphocytic colitis, HLD, bronchitis, GERD, esophageal motility disorder, DM, DDD, chronic back pain, asthma. Lumbar fusion, lumbar disc arthroplasty, cervical spine surgery. Dx COVID 10/30 and developed SOB. Presented to ED 11/6 w/ fever and hypoxia.  Clinical Impression   Pt admitted with above diagnosis. PTA states was home with spouse and was quite independent, she does have long hx of injury and debility sec to back and neck issues but states was able to help w/ spouse and both lived comfortably. Pt currently with functional limitations due to the deficits listed below (see PT Problem List). Presents with decreased overall strength, balance and coordination, independence and activity tolerance. Pt on 2L/min via [place] and was able to maintain sats in high 90s and 100 at rest. With ambulation approx 22ft in room w./ min a, monitor read desat to 60s, but once changed back to wall monitor sats reading in high 90s again. Pt will benefit from skilled PT to increase their independence and safety with mobility to allow discharge to the venue listed below.       Follow Up Recommendations Home health PT    Equipment Recommendations  None recommended by PT    Recommendations for Other Services OT consult     Precautions / Restrictions Precautions Precautions: Fall;Back(has long hx of back injury and surgeries) Restrictions Weight Bearing Restrictions: No      Mobility  Bed Mobility Overal bed mobility: Needs Assistance Bed Mobility: Supine to Sit     Supine to sit: HOB elevated;Min assist        Transfers Overall transfer level: Needs assistance Equipment used: None Transfers: Sit to/from [employer] Transfers Sit  to Stand: Min guard Stand pivot transfers: Min guard          Ambulation/Gait Ambulation/Gait assistance: Min assist Gait Distance (Feet): 50 Feet Assistive device: None Gait Pattern/deviations: Wide base of support;Step-to pattern;Shuffle     General Gait Details: able to ambulate around room on 2L/min via [place] and maintain sats in high 80s and 90s. noted pt ambulates w/ LLE ER WBOS  Stairs            Wheelchair Mobility    Modified Rankin (Stroke Patients Only)       Balance Overall balance assessment: Needs assistance Sitting-balance support: Feet unsupported Sitting balance-Leahy Scale: Fair     Standing balance support: During functional activity Standing balance-Leahy Scale: Fair                               Pertinent Vitals/Pain Pain Assessment: No/denies pain    Home Living Family/patient expects to be discharged to:: Private residence Living Arrangements: Spouse/significant other(has COVID but min sx) Available Help at Discharge: Family Type of Home: House Home Access: Stairs to enter Entrance Stairs-Rails: Can reach both Entrance Stairs-Number of Steps: 3 Home Layout: Two level;Able to live on main level with bedroom/bathroom Home Equipment: Shower seat - built in;Walker - 2 wheels;Shower seat;Cane - single point;Wheelchair - manual;Bedside commode      Prior Function Level of Independence: Independent               Hand Dominance        Extremity/Trunk Assessment   Upper Extremity Assessment Upper Extremity Assessment:  Defer to OT evaluation    Lower Extremity Assessment Lower Extremity Assessment: Generalized weakness    Cervical / Trunk Assessment Cervical / Trunk Assessment: Other exceptions(hx of cervical spine surgery)  Communication   Communication: No difficulties  Cognition Arousal/Alertness: Awake/alert Behavior During Therapy: WFL for tasks assessed/performed Overall Cognitive Status: Within  Functional Limits for tasks assessed                                        General Comments      Exercises Other Exercises Other Exercises: incentive spirometer x 5 able to pull 121ml Other Exercises: flutter valve x 5   Assessment/Plan    PT Assessment Patient needs continued PT services  PT Problem List Decreased strength;Decreased activity tolerance;Decreased balance;Decreased mobility;Decreased coordination;Decreased safety awareness       PT Treatment Interventions Gait training;Functional mobility training;Therapeutic activities;Therapeutic exercise;Neuromuscular re-education;Patient/family education    PT Goals (Current goals can be found in the Care Plan section)  Acute Rehab PT Goals Patient Stated Goal: rest and recover Time For Goal Achievement: 02/21/19 Potential to Achieve Goals: Good    Frequency Min 3X/week   Barriers to discharge        Co-evaluation               AM-PAC PT "6 Clicks" Mobility  Outcome Measure Help needed turning from your back to your side while in a flat bed without using bedrails?: A Little Help needed moving from lying on your back to sitting on the side of a flat bed without using bedrails?: A Little Help needed moving to and from a bed to a chair (including a wheelchair)?: A Little Help needed standing up from a chair using your arms (e.g., wheelchair or bedside chair)?: A Little Help needed to walk in hospital room?: A Little Help needed climbing 3-5 steps with a railing? : A Lot 6 Click Score: 17    End of Session Equipment Utilized During Treatment: Oxygen Activity Tolerance: Treatment limited secondary to medical complications (Comment);Patient limited by lethargy;Patient limited by fatigue Patient left: in chair;with call bell/phone within reach   PT Visit Diagnosis: Unsteadiness on feet (R26.81);Muscle weakness (generalized) (M62.81)    Time: [id number] PT Time Calculation (min) (ACUTE ONLY): 22  min   Charges:   PT Evaluation $PT Eval Moderate Complexity: 1 Mod PT Treatments $Therapeutic Activity: 8-22 mins        Horald Chestnut, PT   Delford Field 02/07/2019, 1:24 PM

## 2019-02-07 NOTE — Progress Notes (Signed)
[facility] PROGRESS NOTE    Progress Note  Tonya Hall  [id number] DOB: [birth year] DOA: 02/06/2019 PCP: [name], MD     Brief Narrative:   Tonya Hall is an 68 y.o. female past medical history of asthma, diabetes mellitus, essential hypertension, psoriatic arthritis with degenerative disc disease and neuropathy comes into the hospital as she had a SARS-CoV-2 test positive on 01/30/2019 and developed shortness of breath.  She relates she visited her family in [place] 2 weeks prior to being diagnosed she started developing cough and shortness of breath about 10 days ago, was treated with prednisone doxycycline completed her treatment but there was no significant improvement.  She relates she also had diarrhea which is now resolved.  2 days prior to admission she started developing cough and shortness of breath issue satting 79% on room air she was placed on nasal cannula, chest x-ray showed right lower lobe density, procalcitonin was 0.2.  He was started empirically on Rocephin and azithromycin.  Assessment/Plan:   Acute respiratory failure with hypoxia due to COVID-19 viral pneumonia: Unlikely to have community-acquired pneumonia, her procalcitonin is 0.2, her elevated white blood cell count is likely due to steroids she was on. Continue IV empiric antibiotics for now as she is high risk of complications due to her immunosuppressive state. We will start her on IV remdesivir and steroids. Try to keep the patient peripherally 16 hours a day, she was started on vitamin C and zinc. We will follow inflammatory markers closely, they are significantly elevated and trending up. She is not a candidate for Actemra will discuss with her the need for convalescent plasma Influenza PCR is pending.  Hypovolemic hyponatremia: Give her a liter of normal saline and continue normal saline for the next 24 hours recheck a basic metabolic panel tomorrow morning.  AKI: Likely prerenal  azotemia in the setting of decreased oral intake and diarrhea. Started on IV fluid hydration recheck basic metabolic panel tomorrow morning.  Diabetes mellitus type 2 in the setting of chronic steroid use: She was started on dexamethasone, which will probably make her sugars erratic. Discontinue NPH start her on long-acting insulin twice a day plus sliding scale. We will check CBGs before meals and at bedtime.  History of asthma: Continue inhalers she is currently on dexamethasone not wheezing on physical exam.  Essential hypertension: Continue current home regimen.  Chronic pain syndrome Continue current regimen.  DVT prophylaxis: lovenox Family Communication:none Disposition Plan/Barrier to D/C: once she is off oxygen Code Status:     Code Status Orders  (From admission, onward)         Start     Ordered   02/06/19 2212  Full code  Continuous     02/06/19 2211        Code Status History    Date Active Date Inactive Code Status Order ID Comments User Context   01/25/2018 0219 01/29/2018 1638 Full Code [id number]  Kary Kos, MD ED   02/18/2013 1808 02/21/2013 1556 Full Code [id number]  Floyce Stakes, MD Inpatient   Advance Care Planning Activity        IV Access:    Peripheral IV   Procedures and diagnostic studies:   Dg Chest Port 1 View  Result Date: 02/06/2019 CLINICAL DATA:  COVID-19, hypoxia, shortness of breath EXAM: PORTABLE CHEST 1 VIEW COMPARISON:  None. FINDINGS: The heart size and mediastinal contours are within normal limits. There is heterogeneous bilateral airspace opacity, most conspicuous in the right upper  lobe. The visualized skeletal structures are unremarkable. IMPRESSION: There is heterogeneous bilateral airspace opacity, most conspicuous in the right upper lobe. Findings are consistent with reported COVID-19 infection. Electronically Signed   By: Eddie Candle M.D.   On: 02/06/2019 18:06     Medical Consultants:    None.   Anti-Infectives:   IV Rocephin IV azithromycin IV remdesivir  Subjective:    [place] G[place] relates her breathing is unchanged compared to yesterday.  Objective:    Vitals:   02/07/19 0300 02/07/19 0400 02/07/19 0500 02/07/19 0600  BP:  (!) 106/53  104/61  Pulse: 77 77 74 74  Resp:      Temp:  (!) 96.7 F (35.9 C)    TempSrc:  Axillary    SpO2: 94% 95% 100% 100%   SpO2: 100 % O2 Flow Rate (L/min): 6 L/min   Intake/Output Summary (Last 24 hours) at 02/07/2019 0747 Last data filed at 02/07/2019 0019 Gross per 24 hour  Intake 820 ml  Output -  Net 820 ml   There were no vitals filed for this visit.  Exam: General exam: In no acute distress. Respiratory system: Good air movement and diffuse crackles at bases bilaterally. Cardiovascular system: S1 & S2 heard, RRR. No JVD. Gastrointestinal system: Abdomen is nondistended, soft and nontender.  Central nervous system: Alert and oriented. No focal neurological deficits. Extremities: No pedal edema. Skin: No rashes, lesions or ulcers Psychiatry: Judgement and insight appear normal. Mood & affect appropriate.   Data Reviewed:    Labs: Basic Metabolic Panel: Recent Labs  Lab 02/06/19 1711  NA 126*  K 4.0  CL 97*  CO2 19*  GLUCOSE 114*  BUN 23  CREATININE 1.12*  CALCIUM 8.4*   GFR CrCl cannot be calculated (Unknown ideal weight.). Liver Function Tests: Recent Labs  Lab 02/06/19 1711  AST 38  ALT 42  ALKPHOS 53  BILITOT 0.7  PROT 6.7  ALBUMIN 3.4*   No results for input(s): LIPASE, AMYLASE in the last 168 hours. No results for input(s): AMMONIA in the last 168 hours. Coagulation profile No results for input(s): INR, PROTIME in the last 168 hours. COVID-19 Labs  Recent Labs    02/06/19 1711  DDIMER 1.05*  FERRITIN 473*  LDH 290*  CRP 21.5*    Lab Results  Component Value Date   SARSCOV2NAA Detected (A) 01/30/2019    CBC: Recent Labs  Lab 02/06/19 1711  WBC 14.0*  NEUTROABS 12.0*   HGB 12.1  HCT 37.7  MCV 100.0  PLT 163   Cardiac Enzymes: No results for input(s): CKTOTAL, CKMB, CKMBINDEX, TROPONINI in the last 168 hours. BNP (last 3 results) No results for input(s): PROBNP in the last 8760 hours. CBG: Recent Labs  Lab 02/06/19 2230 02/07/19 0042 02/07/19 0529  GLUCAP 103* 133* 148*   D-Dimer: Recent Labs    02/06/19 1711  DDIMER 1.05*   Hgb A1c: No results for input(s): HGBA1C in the last 72 hours. Lipid Profile: Recent Labs    02/06/19 1712  TRIG 289*   Thyroid function studies: No results for input(s): TSH, T4TOTAL, T3FREE, THYROIDAB in the last 72 hours.  Invalid input(s): FREET3 Anemia work up: Recent Labs    02/06/19 1711  FERRITIN 473*   Sepsis Labs: Recent Labs  Lab 02/06/19 1711 02/06/19 1905  PROCALCITON 0.27  --   WBC 14.0*  --   LATICACIDVEN 1.3 2.3*   Microbiology Recent Results (from the past 240 hour(s))  Novel Coronavirus, NAA ([facility])  Status: Abnormal   Collection Time: 01/30/19  1:00 PM   Specimen: Nasopharyngeal(NP) swabs in vial transport medium   NASOPHARYNGE  TESTING  Result Value Ref Range Status   SARS-CoV-2, NAA Detected (A) Not Detected Final    Comment: This nucleic acid amplification test was developed and its performance characteristics determined by [employer]. Nucleic acid amplification tests include PCR and TMA. This test has not been FDA cleared or approved. This test has been authorized by FDA under an Emergency Use Authorization (EUA). This test is only authorized for the duration of time the declaration that circumstances exist justifying the authorization of the emergency use of in vitro diagnostic tests for detection of SARS-CoV-2 virus and/or diagnosis of COVID-19 infection under section 564(b)(1) of the Act, 21 U.S.C. [id number]) (1), unless the authorization is terminated or revoked sooner. When diagnostic testing is negative, the possibility of a false negative result  should be considered in the context of a patient's recent exposures and the presence of clinical signs and symptoms consistent with COVID-19. An individual without symptoms of COVID-19 and who is not shedding SARS-CoV-2 virus would  expect to have a negative (not detected) result in this assay.   Blood Culture (routine x 2)     Status: None (Preliminary result)   Collection Time: 02/06/19  5:11 PM   Specimen: BLOOD LEFT ARM  Result Value Ref Range Status   Specimen Description BLOOD LEFT ARM  Final   Special Requests   Final    BOTTLES DRAWN AEROBIC AND ANAEROBIC Blood Culture adequate volume   Culture   Final    NO GROWTH < 24 HOURS Performed at [facility], [address] [place], [place] [address]    Report Status PENDING  Incomplete  Blood Culture (routine x 2)     Status: None (Preliminary result)   Collection Time: 02/06/19  5:12 PM   Specimen: Right Antecubital; Blood  Result Value Ref Range Status   Specimen Description RIGHT ANTECUBITAL  Final   Special Requests   Final    BOTTLES DRAWN AEROBIC AND ANAEROBIC Blood Culture adequate volume   Culture   Final    NO GROWTH < 24 HOURS Performed at [facility], [address] [place], [place] [address]    Report Status PENDING  Incomplete     Medications:   . aspirin  81 mg Oral Daily  . cycloSPORINE  1 drop Both Eyes BID  . dexamethasone  6 mg Oral Daily  . enoxaparin (LOVENOX) injection  40 mg Subcutaneous Q24H  . gabapentin  300 mg Oral QID  . insulin aspart  0-20 Units Subcutaneous Q4H  . insulin NPH Human  10 Units Subcutaneous QHS  . loratadine  10 mg Oral Daily  . losartan  100 mg Oral Daily  . metoprolol succinate  50 mg Oral Daily  . nortriptyline  25 mg Oral QHS  . pantoprazole  40 mg Oral Daily  . potassium chloride SA  20 mEq Oral Daily  . spironolactone  25 mg Oral Daily  . topiramate  200 mg Oral QHS  . vitamin C  500 mg Oral Daily  . zinc sulfate  220 mg Oral Daily   Continuous Infusions: .  azithromycin 500 mg (02/06/19 1929)  . cefTRIAXone (ROCEPHIN)  IV 1 g (02/06/19 1856)  . remdesivir 100 mg in NS 250 mL        LOS: 1 day   Charlynne Cousins  [facility]  02/07/2019, 7:47 AM

## 2019-02-07 NOTE — Plan of Care (Signed)
Patient Summary: No adverse events noted at this time. No SOB or acute distress noted. No c/o pain reported at this time. Patient given tylenol x1 dose for temp 100.3 on admission. Dr. Loralee Pacas paged twice re: Na 129. No new orders received. See MAR re: medication given. Patient on 6L via [place]. Spoke with husband Cornelia Copa via phone, updates given. Husband also spoke with patient in the room via phone. Bed in low lock position. Plan of care will continue.  Problem: Education: Goal: Knowledge of General Education information will improve Description: Including pain rating scale, medication(s)/side effects and non-pharmacologic comfort measures Outcome: Progressing   Problem: Health Behavior/Discharge Planning: Goal: Ability to manage health-related needs will improve Outcome: Progressing   Problem: Clinical Measurements: Goal: Ability to maintain clinical measurements within normal limits will improve Outcome: Progressing Goal: Will remain free from infection Outcome: Progressing Goal: Diagnostic test results will improve Outcome: Progressing Goal: Respiratory complications will improve Outcome: Progressing Goal: Cardiovascular complication will be avoided Outcome: Progressing   Problem: Activity: Goal: Risk for activity intolerance will decrease Outcome: Progressing   Problem: Nutrition: Goal: Adequate nutrition will be maintained Outcome: Progressing   Problem: Coping: Goal: Level of anxiety will decrease Outcome: Progressing   Problem: Elimination: Goal: Will not experience complications related to bowel motility Outcome: Progressing Goal: Will not experience complications related to urinary retention Outcome: Progressing   Problem: Pain Managment: Goal: General experience of comfort will improve Outcome: Progressing   Problem: Safety: Goal: Ability to remain free from injury will improve Outcome: Progressing   Problem: Skin Integrity: Goal: Risk for impaired skin  integrity will decrease Outcome: Progressing

## 2019-02-08 LAB — CBC WITH DIFFERENTIAL/PLATELET
Abs Immature Granulocytes: 0.11 10*3/uL — ABNORMAL HIGH (ref 0.00–0.07)
Basophils Absolute: 0 10*3/uL (ref 0.0–0.1)
Basophils Relative: 0 %
Eosinophils Absolute: 0 10*3/uL (ref 0.0–0.5)
Eosinophils Relative: 0 %
HCT: 29.5 % — ABNORMAL LOW (ref 36.0–46.0)
Hemoglobin: 9.6 g/dL — ABNORMAL LOW (ref 12.0–15.0)
Immature Granulocytes: 1 %
Lymphocytes Relative: 3 %
Lymphs Abs: 0.5 10*3/uL — ABNORMAL LOW (ref 0.7–4.0)
MCH: 31.7 pg (ref 26.0–34.0)
MCHC: 32.5 g/dL (ref 30.0–36.0)
MCV: 97.4 fL (ref 80.0–100.0)
Monocytes Absolute: 0.3 10*3/uL (ref 0.1–1.0)
Monocytes Relative: 2 %
Neutro Abs: 13.4 10*3/uL — ABNORMAL HIGH (ref 1.7–7.7)
Neutrophils Relative %: 94 %
Platelets: 164 10*3/uL (ref 150–400)
RBC: 3.03 MIL/uL — ABNORMAL LOW (ref 3.87–5.11)
RDW: 13 % (ref 11.5–15.5)
WBC: 14.3 10*3/uL — ABNORMAL HIGH (ref 4.0–10.5)
nRBC: 0 % (ref 0.0–0.2)

## 2019-02-08 LAB — COMPREHENSIVE METABOLIC PANEL
ALT: 38 U/L (ref 0–44)
AST: 37 U/L (ref 15–41)
Albumin: 2.7 g/dL — ABNORMAL LOW (ref 3.5–5.0)
Alkaline Phosphatase: 44 U/L (ref 38–126)
Anion gap: 7 (ref 5–15)
BUN: 21 mg/dL (ref 8–23)
CO2: 17 mmol/L — ABNORMAL LOW (ref 22–32)
Calcium: 7.8 mg/dL — ABNORMAL LOW (ref 8.9–10.3)
Chloride: 108 mmol/L (ref 98–111)
Creatinine, Ser: 0.79 mg/dL (ref 0.44–1.00)
GFR calc Af Amer: >60 mL/min (ref >60)
GFR calc non Af Amer: >60 mL/min (ref >60)
Glucose, Bld: 130 mg/dL — ABNORMAL HIGH (ref 70–99)
Potassium: 4.3 mmol/L (ref 3.5–5.1)
Sodium: 132 mmol/L — ABNORMAL LOW (ref 135–145)
Total Bilirubin: 0.2 mg/dL — ABNORMAL LOW (ref 0.3–1.2)
Total Protein: 5.8 g/dL — ABNORMAL LOW (ref 6.5–8.1)

## 2019-02-08 LAB — GLUCOSE, CAPILLARY
Glucose-Capillary: 207 mg/dL — ABNORMAL HIGH (ref 70–99)
Glucose-Capillary: 194 mg/dL — ABNORMAL HIGH (ref 70–99)
Glucose-Capillary: 126 mg/dL — ABNORMAL HIGH (ref 70–99)
Glucose-Capillary: 220 mg/dL — ABNORMAL HIGH (ref 70–99)
Glucose-Capillary: 205 mg/dL — ABNORMAL HIGH (ref 70–99)
Glucose-Capillary: 216 mg/dL — ABNORMAL HIGH (ref 70–99)

## 2019-02-08 LAB — PHOSPHORUS: Phosphorus: 2.1 mg/dL — ABNORMAL LOW (ref 2.5–4.6)

## 2019-02-08 LAB — MAGNESIUM: Magnesium: 2.2 mg/dL (ref 1.7–2.4)

## 2019-02-08 LAB — D-DIMER, QUANTITATIVE: D-Dimer, Quant: 0.77 ug/mL-FEU — ABNORMAL HIGH (ref 0.00–0.50)

## 2019-02-08 LAB — C-REACTIVE PROTEIN: CRP: 12.8 mg/dL — ABNORMAL HIGH (ref <1.0)

## 2019-02-08 MED ORDER — DEXAMETHASONE 4 MG PO TABS
4.0000 mg | ORAL_TABLET | Freq: Two times a day (BID) | ORAL | Status: DC
  Administered 2019-02-08 – 2019-02-09 (×3): 4 mg via ORAL
  Filled 2019-02-08 (×3): qty 1

## 2019-02-08 NOTE — Plan of Care (Signed)
  Problem: Health Behavior/Discharge Planning: Goal: Ability to manage health-related needs will improve 02/08/2019 1124 by Orvan Falconer, RN Outcome: Progressing 02/08/2019 1123 by Orvan Falconer, RN Outcome: Progressing   Problem: Clinical Measurements: Goal: Ability to maintain clinical measurements within normal limits will improve 02/08/2019 1124 by Orvan Falconer, RN Outcome: Progressing 02/08/2019 1123 by Orvan Falconer, RN Outcome: Progressing Goal: Will remain free from infection 02/08/2019 1124 by Orvan Falconer, RN Outcome: Progressing 02/08/2019 1123 by Orvan Falconer, RN Outcome: Progressing Goal: Diagnostic test results will improve 02/08/2019 1124 by Orvan Falconer, RN Outcome: Progressing 02/08/2019 1123 by Orvan Falconer, RN Outcome: Progressing Goal: Respiratory complications will improve 02/08/2019 1124 by Orvan Falconer, RN Outcome: Progressing 02/08/2019 1123 by Orvan Falconer, RN Outcome: Progressing Goal: Cardiovascular complication will be avoided 02/08/2019 1124 by Orvan Falconer, RN Outcome: Progressing 02/08/2019 1123 by Orvan Falconer, RN Outcome: Progressing   Problem: Activity: Goal: Risk for activity intolerance will decrease 02/08/2019 1124 by Orvan Falconer, RN Outcome: Progressing 02/08/2019 1123 by Orvan Falconer, RN Outcome: Progressing   Problem: Nutrition: Goal: Adequate nutrition will be maintained 02/08/2019 1124 by Orvan Falconer, RN Outcome: Progressing 02/08/2019 1123 by Orvan Falconer, RN Outcome: Progressing   Problem: Coping: Goal: Level of anxiety will decrease 02/08/2019 1124 by Orvan Falconer, RN Outcome: Progressing 02/08/2019 1123 by Orvan Falconer, RN Outcome: Progressing   Problem: Elimination: Goal: Will not experience complications related to bowel motility 02/08/2019 1124 by Orvan Falconer, RN Outcome: Progressing 02/08/2019 1123 by Orvan Falconer, RN Outcome: Progressing Goal: Will not experience complications related to urinary retention 02/08/2019 1124 by Orvan Falconer, RN Outcome: Progressing 02/08/2019 1123 by Orvan Falconer, RN Outcome: Progressing   Problem: Pain Managment: Goal: General experience of comfort will improve 02/08/2019 1124 by Orvan Falconer, RN Outcome: Progressing 02/08/2019 1123 by Orvan Falconer, RN Outcome: Progressing   Problem: Safety: Goal: Ability to remain free from injury will improve 02/08/2019 1124 by Orvan Falconer, RN Outcome: Progressing 02/08/2019 1123 by Orvan Falconer, RN Outcome: Progressing   Problem: Skin Integrity: Goal: Risk for impaired skin integrity will decrease 02/08/2019 1124 by Orvan Falconer, RN Outcome: Progressing 02/08/2019 1123 by Orvan Falconer, RN Outcome: Progressing

## 2019-02-08 NOTE — Progress Notes (Signed)
[facility] PROGRESS NOTE    Progress Note  Tonya Hall  [id number] DOB: 07-27-1950 DOA: 02/06/2019 PCP: [name], MD     Brief Narrative:   Tonya Hall is an 68 y.o. female past medical history of asthma, diabetes mellitus, essential hypertension, psoriatic arthritis with degenerative disc disease and neuropathy comes into the hospital as she had a SARS-CoV-2 test positive on 01/30/2019 and developed shortness of breath.  She relates she visited her family in [place] 2 weeks prior to being diagnosed she started developing cough and shortness of breath about 10 days ago, was treated with prednisone doxycycline completed her treatment but there was no significant improvement.  She relates she also had diarrhea which is now resolved.  2 days prior to admission she started developing cough and shortness of breath issue satting 79% on room air she was placed on nasal cannula, chest x-ray showed right lower lobe density, procalcitonin was 0.2.  He was started empirically on Rocephin and azithromycin.  Assessment/Plan:   Acute respiratory failure with hypoxia due to COVID-19 viral pneumonia: She was started empirically on Rocephin and azithromycin due to her immunosuppressive state we will continue 5-day course of IV antibiotics. Continue IV remdesivir and steroids. Try to keep the patient prone for at least 16 hours a day, continue vitamin C and zinc. Inflammatory markers are pending this morning. She is not a candidate for Actemra. Influenza PCR is pending.  Hypovolemic hyponatremia: Improved with IV fluids. KVO them.  AKI: Prerenal azotemia in the setting of decreased oral intake and diarrhea. Her creatinine improved with IV fluid hydration.  Diabetes mellitus type 2 in the setting of chronic steroid use: Her hemoglobin A1c is 6.4 she is currently on long-acting insulin plus sliding scale with fairly controlled blood glucose continue current regimen.  History of  asthma: Continue inhalers she is currently on dexamethasone not wheezing on physical exam.  Essential hypertension: Continue current home regimen.  Chronic pain syndrome Continue current regimen.  DVT prophylaxis: lovenox Family Communication:none Disposition Plan/Barrier to D/C: once she is off oxygen Code Status:     Code Status Orders  (From admission, onward)         Start     Ordered   02/06/19 2212  Full code  Continuous     02/06/19 2211        Code Status History    Date Active Date Inactive Code Status Order ID Comments User Context   01/25/2018 0219 01/29/2018 1638 Full Code [id number]  Kary Kos, MD ED   02/18/2013 1808 02/21/2013 1556 Full Code [id number]  Floyce Stakes, MD Inpatient   Advance Care Planning Activity        IV Access:    Peripheral IV   Procedures and diagnostic studies:   Dg Chest Port 1 View  Result Date: 02/06/2019 CLINICAL DATA:  COVID-19, hypoxia, shortness of breath EXAM: PORTABLE CHEST 1 VIEW COMPARISON:  None. FINDINGS: The heart size and mediastinal contours are within normal limits. There is heterogeneous bilateral airspace opacity, most conspicuous in the right upper lobe. The visualized skeletal structures are unremarkable. IMPRESSION: There is heterogeneous bilateral airspace opacity, most conspicuous in the right upper lobe. Findings are consistent with reported COVID-19 infection. Electronically Signed   By: Eddie Candle M.D.   On: 02/06/2019 18:06     Medical Consultants:    None.  Anti-Infectives:   IV Rocephin IV azithromycin IV remdesivir  Subjective:    Tonya Hall relates her breathing is  significantly improved compared to yesterday.  Objective:    Vitals:   02/08/19 0300 02/08/19 0400 02/08/19 0500 02/08/19 0700  BP:    109/62  Pulse: 80 81 80 84  Resp:    20  Temp:    (!) 97.1 F (36.2 C)  TempSrc:    Oral  SpO2: 94% 95% 94% 96%  Weight:      Height:       SpO2: 96 % O2 Flow Rate  (L/min): 1 L/min   Intake/Output Summary (Last 24 hours) at 02/08/2019 0851 Last data filed at 02/08/2019 0600 Gross per 24 hour  Intake 3519.89 ml  Output 2 ml  Net 3517.89 ml   Filed Weights   02/07/19 1200  Weight: 64.1 kg    Exam: General exam: In no acute distress. Respiratory system: Good air movement and crackles at bases bilaterally. Cardiovascular system: S1 & S2 heard, RRR. No JVD. Gastrointestinal system: Abdomen is nondistended, soft and nontender.  Central nervous system: Alert and oriented. No focal neurological deficits. Extremities: No pedal edema. Skin: No rashes, lesions or ulcers Psychiatry: Judgement and insight appear normal. Mood & affect appropriate.    Data Reviewed:    Labs: Basic Metabolic Panel: Recent Labs  Lab 02/06/19 1711 02/07/19 0617  NA 126* 132*  K 4.0 4.4  CL 97* 100  CO2 19* 20*  GLUCOSE 114* 160*  BUN 23 21  CREATININE 1.12* 0.93  CALCIUM 8.4* 8.4*  MG  --  2.0  PHOS  --  4.0   GFR Estimated Creatinine Clearance: 46.5 mL/min (by C-G formula based on SCr of 0.93 mg/dL). Liver Function Tests: Recent Labs  Lab 02/06/19 1711 02/07/19 0617  AST 38 30  ALT 42 36  ALKPHOS 53 47  BILITOT 0.7 0.2*  PROT 6.7 6.3*  ALBUMIN 3.4* 3.0*   No results for input(s): LIPASE, AMYLASE in the last 168 hours. No results for input(s): AMMONIA in the last 168 hours. Coagulation profile No results for input(s): INR, PROTIME in the last 168 hours. COVID-19 Labs  Recent Labs    02/06/19 1711 02/07/19 0617  DDIMER 1.05* 0.96*  FERRITIN 473* 560*  LDH 290*  --   CRP 21.5* 26.2*    Lab Results  Component Value Date   SARSCOV2NAA Detected (A) 01/30/2019    CBC: Recent Labs  Lab 02/06/19 1711 02/07/19 0617 02/08/19 0633  WBC 14.0* 9.5 14.3*  NEUTROABS 12.0* 8.5* 13.4*  HGB 12.1 11.4* 9.6*  HCT 37.7 35.7* 29.5*  MCV 100.0 98.9 97.4  PLT 163 163 164   Cardiac Enzymes: No results for input(s): CKTOTAL, CKMB, CKMBINDEX,  TROPONINI in the last 168 hours. BNP (last 3 results) No results for input(s): PROBNP in the last 8760 hours. CBG: Recent Labs  Lab 02/07/19 1205 02/07/19 1621 02/07/19 2130 02/08/19 0203 02/08/19 0751  GLUCAP 183* 207* 221* 194* 126*   D-Dimer: Recent Labs    02/06/19 1711 02/07/19 0617  DDIMER 1.05* 0.96*   Hgb A1c: Recent Labs    02/07/19 0617  HGBA1C 6.4*   Lipid Profile: Recent Labs    02/06/19 1712  TRIG 289*   Thyroid function studies: No results for input(s): TSH, T4TOTAL, T3FREE, THYROIDAB in the last 72 hours.  Invalid input(s): FREET3 Anemia work up: Recent Labs    02/06/19 1711 02/07/19 0617  FERRITIN 473* 560*   Sepsis Labs: Recent Labs  Lab 02/06/19 1711 02/06/19 1905 02/07/19 0617 02/08/19 0633  PROCALCITON 0.27  --   --   --  WBC 14.0*  --  9.5 14.3*  LATICACIDVEN 1.3 2.3*  --   --    Microbiology Recent Results (from the past 240 hour(s))  Novel Coronavirus, NAA ([facility])     Status: Abnormal   Collection Time: 01/30/19  1:00 PM   Specimen: Nasopharyngeal(NP) swabs in vial transport medium   NASOPHARYNGE  TESTING  Result Value Ref Range Status   SARS-CoV-2, NAA Detected (A) Not Detected Final    Comment: This nucleic acid amplification test was developed and its performance characteristics determined by [employer]. Nucleic acid amplification tests include PCR and TMA. This test has not been FDA cleared or approved. This test has been authorized by FDA under an Emergency Use Authorization (EUA). This test is only authorized for the duration of time the declaration that circumstances exist justifying the authorization of the emergency use of in vitro diagnostic tests for detection of SARS-CoV-2 virus and/or diagnosis of COVID-19 infection under section 564(b)(1) of the Act, 21 U.S.C. [id number]) (1), unless the authorization is terminated or revoked sooner. When diagnostic testing is negative, the possibility of a  false negative result should be considered in the context of a patient's recent exposures and the presence of clinical signs and symptoms consistent with COVID-19. An individual without symptoms of COVID-19 and who is not shedding SARS-CoV-2 virus would  expect to have a negative (not detected) result in this assay.   Blood Culture (routine x 2)     Status: None (Preliminary result)   Collection Time: 02/06/19  5:11 PM   Specimen: BLOOD LEFT ARM  Result Value Ref Range Status   Specimen Description BLOOD LEFT ARM  Final   Special Requests   Final    BOTTLES DRAWN AEROBIC AND ANAEROBIC Blood Culture adequate volume   Culture   Final    NO GROWTH < 24 HOURS Performed at [facility], [address] [place], [place] [address]    Report Status PENDING  Incomplete  Blood Culture (routine x 2)     Status: None (Preliminary result)   Collection Time: 02/06/19  5:12 PM   Specimen: Right Antecubital; Blood  Result Value Ref Range Status   Specimen Description RIGHT ANTECUBITAL  Final   Special Requests   Final    BOTTLES DRAWN AEROBIC AND ANAEROBIC Blood Culture adequate volume   Culture   Final    NO GROWTH < 24 HOURS Performed at [facility], [address] [place], [place] [address]    Report Status PENDING  Incomplete     Medications:   . aspirin  81 mg Oral Daily  . cycloSPORINE  1 drop Both Eyes BID  . dexamethasone  6 mg Oral Q12H  . enoxaparin (LOVENOX) injection  40 mg Subcutaneous Q24H  . gabapentin  300 mg Oral QID  . insulin aspart  0-15 Units Subcutaneous TID WC  . insulin aspart  0-5 Units Subcutaneous QHS  . insulin aspart  4 Units Subcutaneous TID WC  . insulin detemir  10 Units Subcutaneous BID  . leflunomide  20 mg Oral Daily  . loratadine  10 mg Oral Daily  . losartan  100 mg Oral Daily  . metoprolol succinate  50 mg Oral Daily  . nortriptyline  25 mg Oral QHS  . pantoprazole  40 mg Oral Daily  . potassium chloride SA  20 mEq Oral Daily  .  spironolactone  25 mg Oral Daily  . topiramate  200 mg Oral QHS  . vitamin C  500 mg  Oral Daily  . zinc sulfate  220 mg Oral Daily   Continuous Infusions: . azithromycin 500 mg (02/07/19 1717)  . cefTRIAXone (ROCEPHIN)  IV 1 g (02/07/19 2302)  . remdesivir 100 mg in NS 250 mL 100 mg (02/07/19 1558)      LOS: 2 days   Charlynne Cousins  [facility]  02/08/2019, 8:51 AM

## 2019-02-08 NOTE — Progress Notes (Signed)
Called and updated patient's husband, Cornelia Copa, on his wife's condition.  All questions and concerns addressed.  Earleen Reaper RN

## 2019-02-09 ENCOUNTER — Telehealth (HOSPITAL_COMMUNITY): Payer: Self-pay | Admitting: Physical Therapy

## 2019-02-09 LAB — CBC WITH DIFFERENTIAL/PLATELET
Abs Immature Granulocytes: 0.19 10*3/uL — ABNORMAL HIGH (ref 0.00–0.07)
Basophils Absolute: 0 10*3/uL (ref 0.0–0.1)
Basophils Relative: 0 %
Eosinophils Absolute: 0 10*3/uL (ref 0.0–0.5)
Eosinophils Relative: 0 %
HCT: 29 % — ABNORMAL LOW (ref 36.0–46.0)
Hemoglobin: 9.4 g/dL — ABNORMAL LOW (ref 12.0–15.0)
Immature Granulocytes: 1 %
Lymphocytes Relative: 3 %
Lymphs Abs: 0.6 10*3/uL — ABNORMAL LOW (ref 0.7–4.0)
MCH: 32.5 pg (ref 26.0–34.0)
MCHC: 32.4 g/dL (ref 30.0–36.0)
MCV: 100.3 fL — ABNORMAL HIGH (ref 80.0–100.0)
Monocytes Absolute: 0.6 10*3/uL (ref 0.1–1.0)
Monocytes Relative: 3 %
Neutro Abs: 18.4 10*3/uL — ABNORMAL HIGH (ref 1.7–7.7)
Neutrophils Relative %: 93 %
Platelets: 193 10*3/uL (ref 150–400)
RBC: 2.89 MIL/uL — ABNORMAL LOW (ref 3.87–5.11)
RDW: 13.2 % (ref 11.5–15.5)
WBC: 19.8 10*3/uL — ABNORMAL HIGH (ref 4.0–10.5)
nRBC: 0 % (ref 0.0–0.2)

## 2019-02-09 LAB — COMPREHENSIVE METABOLIC PANEL
ALT: 39 U/L (ref 0–44)
AST: 33 U/L (ref 15–41)
Albumin: 2.6 g/dL — ABNORMAL LOW (ref 3.5–5.0)
Alkaline Phosphatase: 54 U/L (ref 38–126)
Anion gap: 9 (ref 5–15)
BUN: 26 mg/dL — ABNORMAL HIGH (ref 8–23)
CO2: 18 mmol/L — ABNORMAL LOW (ref 22–32)
Calcium: 8.3 mg/dL — ABNORMAL LOW (ref 8.9–10.3)
Chloride: 108 mmol/L (ref 98–111)
Creatinine, Ser: 0.91 mg/dL (ref 0.44–1.00)
GFR calc Af Amer: >60 mL/min (ref >60)
GFR calc non Af Amer: >60 mL/min (ref >60)
Glucose, Bld: 162 mg/dL — ABNORMAL HIGH (ref 70–99)
Potassium: 4.3 mmol/L (ref 3.5–5.1)
Sodium: 135 mmol/L (ref 135–145)
Total Bilirubin: 0.2 mg/dL — ABNORMAL LOW (ref 0.3–1.2)
Total Protein: 5.6 g/dL — ABNORMAL LOW (ref 6.5–8.1)

## 2019-02-09 LAB — C-REACTIVE PROTEIN: CRP: 8.7 mg/dL — ABNORMAL HIGH (ref <1.0)

## 2019-02-09 LAB — GLUCOSE, CAPILLARY
Glucose-Capillary: 204 mg/dL — ABNORMAL HIGH (ref 70–99)
Glucose-Capillary: 272 mg/dL — ABNORMAL HIGH (ref 70–99)
Glucose-Capillary: 211 mg/dL — ABNORMAL HIGH (ref 70–99)
Glucose-Capillary: 248 mg/dL — ABNORMAL HIGH (ref 70–99)

## 2019-02-09 LAB — D-DIMER, QUANTITATIVE: D-Dimer, Quant: 0.56 ug/mL-FEU — ABNORMAL HIGH (ref 0.00–0.50)

## 2019-02-09 LAB — PHOSPHORUS: Phosphorus: 2.1 mg/dL — ABNORMAL LOW (ref 2.5–4.6)

## 2019-02-09 LAB — MAGNESIUM: Magnesium: 2.3 mg/dL (ref 1.7–2.4)

## 2019-02-09 NOTE — Telephone Encounter (Signed)
pt called to cancel her appts due to she is in the hospital for covid 19

## 2019-02-09 NOTE — TOC Initial Note (Signed)
Transition of Care [facility]) - Initial/Assessment Note    Patient Details  Name: Tonya Hall MRN: [id number] Date of Birth: 1950-04-23  Transition of [facility]) CM/SW Contact:    Carles Collet, RN Phone Number: 02/09/2019, 1:57 PM  Clinical Narrative:              Damaris Schooner w patient over the phone. She is from w spouse, independent PTA. Has RW at home. She declined Cool services. Patient has been up to shower on her own, walking in room on RA independently. No CM needs identified at this time for DC.      Expected Discharge Plan: Home/Self Care Barriers to Discharge: Continued Medical Work up   Patient Goals and CMS Choice        Expected Discharge Plan and Services Expected Discharge Plan: Home/Self Care   Discharge Planning Services: CM Consult   Living arrangements for the past 2 months: Single Family Home                           HH Arranged: Patient Refused HH          Prior Living Arrangements/Services Living arrangements for the past 2 months: Single Family Home Lives with:: Spouse                   Activities of Daily Living      Permission Sought/Granted                  Emotional Assessment              Admission diagnosis:  Hypoxia [R09.02] Multifocal pneumonia [J18.9] COVID-19 virus infection [U07.1] Patient Active Problem List   Diagnosis Date Noted  . AKI (acute kidney injury) ([place]) 02/07/2019  . Acute respiratory failure with hypoxia ([place]) 02/06/2019  . Acute respiratory disease due to COVID-19 virus 02/06/2019  . Community acquired pneumonia 02/06/2019  . Hyponatremia 02/06/2019  . Asthma   . Diabetes mellitus without complication ([place])   . Closed nondisplaced odontoid fracture with type II morphology ([place]) 01/27/2018  . Odontoid fracture ([place]) 01/24/2018  . Chest pain 11/14/2017  . Transaminitis 06/22/2016  . Collagenous colitis 05/23/2016  . Psoriasis 05/21/2016  . High risk medication use 02/15/2016  .  Age-related osteoporosis without current pathological fracture 02/15/2016  . Trochanteric bursitis, left hip 02/15/2016  . Sacroiliitis, not elsewhere classified ([place]) 02/15/2016  . Chronic pain syndrome 02/15/2016  . Abnormal weight gain 06/25/2012  . Hypertension 07/03/2011  . Hypercholesteremia 07/03/2011  . Psoriatic arthritis ([place]) 07/03/2011  . Osteoarthritis of multiple joints 07/03/2011  . Esophageal motility disorder 02/14/2011  . Cough 02/14/2011  . GERD 05/05/2010   PCP:  [name], MD Pharmacy:   [facility] [address] [place] [address] Phone: [phone] Fax: [phone]     Social Determinants of Health (SDOH) Interventions    Readmission Risk Interventions No flowsheet data found.

## 2019-02-09 NOTE — Progress Notes (Signed)
Inpatient Diabetes Program Recommendations  AACE/ADA: New Consensus Statement on Inpatient Glycemic Control (2015)  Target Ranges:  Prepandial:   less than 140 mg/dL      Peak postprandial:   less than 180 mg/dL (1-2 hours)      Critically ill patients:  140 - 180 mg/dL   Lab Results  Component Value Date   GLUCAP 272 (H) 02/09/2019   HGBA1C 6.4 (H) 02/07/2019    Review of Glycemic Control Results for Tonya Hall, Tonya Hall (MRN [id number]) as of 02/09/2019 13:55  Ref. Range 02/08/2019 21:36 02/09/2019 07:35 02/09/2019 11:37  Glucose-Capillary Latest Ref Range: 70 - 99 mg/dL 216 (H) 204 (H) 272 (H)   Diabetes history: Type 2 DM Outpatient Diabetes medications: Humulin N 10 units QHS Current orders for Inpatient glycemic control: Novolog 4 units TID, Levemir 10 units BID, Novolog 0-15 units TID, Novolog 0-5 units QHS Decadron 4 mg BID  Inpatient Diabetes Program Recommendations:    Noted steroid tapered to 4 mg.   Consider increasing Levmemir to 12 units BID and Novolog to 6 units TID (assuming patient is consuming >50% of meal).   Thanks, Bronson Curb, MSN, RNC-OB Diabetes Coordinator [phone] (8a-5p)

## 2019-02-09 NOTE — Plan of Care (Addendum)
Patient up to chair majority of the day. All medication given well tolerated. No s/s of pain or distress. Patient updated family of care plan with plans to discharge tomorrow. Will continue to monitor for remainder of shift.    Problem: Health Behavior/Discharge Planning: Goal: Ability to manage health-related needs will improve 02/09/2019 1123 by Orvan Falconer, RN Outcome: Progressing 02/09/2019 1123 by Orvan Falconer, RN Outcome: Progressing   Problem: Clinical Measurements: Goal: Ability to maintain clinical measurements within normal limits will improve 02/09/2019 1123 by Orvan Falconer, RN Outcome: Progressing 02/09/2019 1123 by Orvan Falconer, RN Outcome: Progressing Goal: Will remain free from infection 02/09/2019 1123 by Orvan Falconer, RN Outcome: Progressing 02/09/2019 1123 by Orvan Falconer, RN Outcome: Progressing Goal: Diagnostic test results will improve 02/09/2019 1123 by Orvan Falconer, RN Outcome: Progressing 02/09/2019 1123 by Orvan Falconer, RN Outcome: Progressing Goal: Respiratory complications will improve 02/09/2019 1123 by Orvan Falconer, RN Outcome: Progressing 02/09/2019 1123 by Orvan Falconer, RN Outcome: Progressing Goal: Cardiovascular complication will be avoided 02/09/2019 1123 by Orvan Falconer, RN Outcome: Progressing 02/09/2019 1123 by Orvan Falconer, RN Outcome: Progressing   Problem: Activity: Goal: Risk for activity intolerance will decrease 02/09/2019 1123 by Orvan Falconer, RN Outcome: Progressing 02/09/2019 1123 by Orvan Falconer, RN Outcome: Progressing   Problem: Nutrition: Goal: Adequate nutrition will be maintained 02/09/2019 1123 by Orvan Falconer, RN Outcome: Progressing 02/09/2019 1123 by Orvan Falconer, RN Outcome: Progressing   Problem: Coping: Goal: Level of anxiety will decrease 02/09/2019 1123 by Orvan Falconer, RN Outcome: Progressing 02/09/2019 1123 by Orvan Falconer, RN Outcome: Progressing   Problem: Elimination: Goal: Will not experience complications related to bowel motility 02/09/2019 1123 by Orvan Falconer, RN Outcome: Progressing 02/09/2019 1123 by Orvan Falconer, RN Outcome: Progressing Goal: Will not experience complications related to urinary retention 02/09/2019 1123 by Orvan Falconer, RN Outcome: Progressing 02/09/2019 1123 by Orvan Falconer, RN Outcome: Progressing   Problem: Pain Managment: Goal: General experience of comfort will improve 02/09/2019 1123 by Orvan Falconer, RN Outcome: Progressing 02/09/2019 1123 by Orvan Falconer, RN Outcome: Progressing   Problem: Safety: Goal: Ability to remain free from injury will improve 02/09/2019 1123 by Orvan Falconer, RN Outcome: Progressing 02/09/2019 1123 by Orvan Falconer, RN Outcome: Progressing   Problem: Skin Integrity: Goal: Risk for impaired skin integrity will decrease 02/09/2019 1123 by Orvan Falconer, RN Outcome: Progressing 02/09/2019 1123 by Orvan Falconer, RN Outcome: Progressing

## 2019-02-09 NOTE — Progress Notes (Signed)
[facility] PROGRESS NOTE    Progress Note  DEARI WEMPLE  [id number] DOB: 1950-08-30 DOA: 02/06/2019 PCP: [name], MD     Brief Narrative:   Tonya Hall is an 68 y.o. female past medical history of asthma, diabetes mellitus, essential hypertension, psoriatic arthritis with degenerative disc disease and neuropathy comes into the hospital as she had a SARS-CoV-2 test positive on 01/30/2019 and developed shortness of breath.  She relates she visited her family in [place] 2 weeks prior to being diagnosed she started developing cough and shortness of breath about 10 days ago, was treated with prednisone doxycycline completed her treatment but there was no significant improvement.  She relates she also had diarrhea which is now resolved.  2 days prior to admission she started developing cough and shortness of breath issue satting 79% on room air she was placed on nasal cannula, chest x-ray showed right lower lobe density, procalcitonin was 0.2.  He was started empirically on Rocephin and azithromycin.  Assessment/Plan:   Acute respiratory failure with hypoxia due to COVID-19 viral pneumonia: He has been weaned to room air currently satting greater than 94%. We will continue IV Rocephin and azithromycin for a total of 5 days. Continue IV remdesivir and steroids her inflammatory markers are improving. Vitamin C and zinc, she is not a candidate for for Actemra as she is on multiple immunosuppressive therapy. IV is negative.  Hypovolemic hyponatremia: Resolved with IV fluid hydration.  AKI: Prerenal azotemia in the setting of decreased oral intake and diarrhea. Has resolved with IV fluid hydration.  Diabetes mellitus type 2 in the setting of chronic steroid use: 6.4, she is currently on long-acting insulin plus sliding scale blood glucose fairly controlled.  History of asthma: Continue inhalers she is currently on dexamethasone not wheezing on physical exam.  Essential  hypertension: Continue current home regimen.  Chronic pain syndrome Continue current regimen.  DVT prophylaxis: lovenox Family Communication:none Disposition Plan/Barrier to D/C: Once she finishes her remdesivir treatment Code Status:     Code Status Orders  (From admission, onward)         Start     Ordered   02/06/19 2212  Full code  Continuous     02/06/19 2211        Code Status History    Date Active Date Inactive Code Status Order ID Comments User Context   01/25/2018 0219 01/29/2018 1638 Full Code [id number]  Kary Kos, MD ED   02/18/2013 1808 02/21/2013 1556 Full Code [id number]  Floyce Stakes, MD Inpatient   Advance Care Planning Activity        IV Access:    Peripheral IV   Procedures and diagnostic studies:   No results found.   Medical Consultants:    None.  Anti-Infectives:   IV Rocephin IV azithromycin IV remdesivir  Subjective:    Franki Monte she relates she continues to improve.  Objective:    Vitals:   02/09/19 0500 02/09/19 0600 02/09/19 0650 02/09/19 0700  BP:   (!) 110/56 127/65  Pulse: 78 74 87 78  Resp:   18 18  Temp:   97.9 F (36.6 C) 97.7 F (36.5 C)  TempSrc:   Oral Oral  SpO2: 95% 94% 99% 95%  Weight:      Height:       SpO2: 95 % O2 Flow Rate (L/min): 1 L/min   Intake/Output Summary (Last 24 hours) at 02/09/2019 0836 Last data filed at 02/09/2019 0600 Gross per  24 hour  Intake 1080 ml  Output 0 ml  Net 1080 ml   Filed Weights   02/07/19 1200  Weight: 64.1 kg    Exam: General exam: In no acute distress. Respiratory system: Good air movement and crackles bilaterally. Cardiovascular system: S1 & S2 heard, RRR. No JVD. Gastrointestinal system: Abdomen is nondistended, soft and nontender.  Central nervous system: Alert and oriented. No focal neurological deficits. Extremities: No pedal edema. Skin: No rashes, lesions or ulcers Psychiatry: Judgement and insight appear normal. Mood & affect  appropriate.   Data Reviewed:    Labs: Basic Metabolic Panel: Recent Labs  Lab 02/06/19 1711 02/07/19 0617 02/08/19 0633 02/09/19 0410  NA 126* 132* 132* 135  K 4.0 4.4 4.3 4.3  CL 97* 100 108 108  CO2 19* 20* 17* 18*  GLUCOSE 114* 160* 130* 162*  BUN 23 21 21  26*  CREATININE 1.12* 0.93 0.79 0.91  CALCIUM 8.4* 8.4* 7.8* 8.3*  MG  --  2.0 2.2 2.3  PHOS  --  4.0 2.1* 2.1*   GFR Estimated Creatinine Clearance: 47.5 mL/min (by C-G formula based on SCr of 0.91 mg/dL). Liver Function Tests: Recent Labs  Lab 02/06/19 1711 02/07/19 0617 02/08/19 0633 02/09/19 0410  AST 38 30 37 33  ALT 42 36 38 39  ALKPHOS 53 47 44 54  BILITOT 0.7 0.2* 0.2* 0.2*  PROT 6.7 6.3* 5.8* 5.6*  ALBUMIN 3.4* 3.0* 2.7* 2.6*   No results for input(s): LIPASE, AMYLASE in the last 168 hours. No results for input(s): AMMONIA in the last 168 hours. Coagulation profile No results for input(s): INR, PROTIME in the last 168 hours. COVID-19 Labs  Recent Labs    02/06/19 1711 02/07/19 0617 02/08/19 0633 02/09/19 0410  DDIMER 1.05* 0.96* 0.77* 0.56*  FERRITIN 473* 560*  --   --   LDH 290*  --   --   --   CRP 21.5* 26.2* 12.8* 8.7*    Lab Results  Component Value Date   SARSCOV2NAA Detected (A) 01/30/2019    CBC: Recent Labs  Lab 02/06/19 1711 02/07/19 0617 02/08/19 0633 02/09/19 0410  WBC 14.0* 9.5 14.3* 19.8*  NEUTROABS 12.0* 8.5* 13.4* 18.4*  HGB 12.1 11.4* 9.6* 9.4*  HCT 37.7 35.7* 29.5* 29.0*  MCV 100.0 98.9 97.4 100.3*  PLT 163 163 164 193   Cardiac Enzymes: No results for input(s): CKTOTAL, CKMB, CKMBINDEX, TROPONINI in the last 168 hours. BNP (last 3 results) No results for input(s): PROBNP in the last 8760 hours. CBG: Recent Labs  Lab 02/08/19 0751 02/08/19 1124 02/08/19 1649 02/08/19 2136 02/09/19 0735  GLUCAP 126* 220* 205* 216* 204*   D-Dimer: Recent Labs    02/08/19 0633 02/09/19 0410  DDIMER 0.77* 0.56*   Hgb A1c: Recent Labs    02/07/19 0617   HGBA1C 6.4*   Lipid Profile: Recent Labs    02/06/19 1712  TRIG 289*   Thyroid function studies: No results for input(s): TSH, T4TOTAL, T3FREE, THYROIDAB in the last 72 hours.  Invalid input(s): FREET3 Anemia work up: Recent Labs    02/06/19 1711 02/07/19 0617  FERRITIN 473* 560*   Sepsis Labs: Recent Labs  Lab 02/06/19 1711 02/06/19 1905 02/07/19 0617 02/08/19 0633 02/09/19 0410  PROCALCITON 0.27  --   --   --   --   WBC 14.0*  --  9.5 14.3* 19.8*  LATICACIDVEN 1.3 2.3*  --   --   --    Microbiology Recent Results (from the past  240 hour(s))  Novel Coronavirus, NAA ([facility])     Status: Abnormal   Collection Time: 01/30/19  1:00 PM   Specimen: Nasopharyngeal(NP) swabs in vial transport medium   NASOPHARYNGE  TESTING  Result Value Ref Range Status   SARS-CoV-2, NAA Detected (A) Not Detected Final    Comment: This nucleic acid amplification test was developed and its performance characteristics determined by [employer]. Nucleic acid amplification tests include PCR and TMA. This test has not been FDA cleared or approved. This test has been authorized by FDA under an Emergency Use Authorization (EUA). This test is only authorized for the duration of time the declaration that circumstances exist justifying the authorization of the emergency use of in vitro diagnostic tests for detection of SARS-CoV-2 virus and/or diagnosis of COVID-19 infection under section 564(b)(1) of the Act, 21 U.S.C. [id number]) (1), unless the authorization is terminated or revoked sooner. When diagnostic testing is negative, the possibility of a false negative result should be considered in the context of a patient's recent exposures and the presence of clinical signs and symptoms consistent with COVID-19. An individual without symptoms of COVID-19 and who is not shedding SARS-CoV-2 virus would  expect to have a negative (not detected) result in this assay.   Blood Culture  (routine x 2)     Status: None (Preliminary result)   Collection Time: 02/06/19  5:11 PM   Specimen: BLOOD LEFT ARM  Result Value Ref Range Status   Specimen Description BLOOD LEFT ARM  Final   Special Requests   Final    BOTTLES DRAWN AEROBIC AND ANAEROBIC Blood Culture adequate volume   Culture   Final    NO GROWTH 3 DAYS Performed at [facility], [address] [place], [place] [address]    Report Status PENDING  Incomplete  Blood Culture (routine x 2)     Status: None (Preliminary result)   Collection Time: 02/06/19  5:12 PM   Specimen: Right Antecubital; Blood  Result Value Ref Range Status   Specimen Description RIGHT ANTECUBITAL  Final   Special Requests   Final    BOTTLES DRAWN AEROBIC AND ANAEROBIC Blood Culture adequate volume   Culture   Final    NO GROWTH 3 DAYS Performed at [facility], [address] [place], [place] [address]    Report Status PENDING  Incomplete     Medications:   . aspirin  81 mg Oral Daily  . cycloSPORINE  1 drop Both Eyes BID  . dexamethasone  4 mg Oral Q12H  . enoxaparin (LOVENOX) injection  40 mg Subcutaneous Q24H  . gabapentin  300 mg Oral QID  . insulin aspart  0-15 Units Subcutaneous TID WC  . insulin aspart  0-5 Units Subcutaneous QHS  . insulin aspart  4 Units Subcutaneous TID WC  . insulin detemir  10 Units Subcutaneous BID  . leflunomide  20 mg Oral Daily  . loratadine  10 mg Oral Daily  . losartan  100 mg Oral Daily  . metoprolol succinate  50 mg Oral Daily  . nortriptyline  25 mg Oral QHS  . pantoprazole  40 mg Oral Daily  . potassium chloride SA  20 mEq Oral Daily  . spironolactone  25 mg Oral Daily  . topiramate  200 mg Oral QHS  . vitamin C  500 mg Oral Daily  . zinc sulfate  220 mg Oral Daily   Continuous Infusions: . azithromycin 500 mg (02/08/19 1755)  . cefTRIAXone (ROCEPHIN)  IV 1  g (02/08/19 2212)  . remdesivir 100 mg in NS 250 mL 100 mg (02/08/19 1544)      LOS: 3 days   Charlynne Cousins  [facility]  02/09/2019, 8:36 AM

## 2019-02-09 NOTE — Progress Notes (Signed)
Physical Therapy Treatment Patient Details Name: Tonya Hall MRN: [id number] DOB: 02-17-1951 Today's Date: 02/09/2019    History of Present Illness 68 y/o female w/ hx of SUI, Raynaud's disease, psoriatic arthritis, peripheral edema, osteoporosis, neuropathy, lymphocytic colitis, HLD, bronchitis, GERD, esophageal motility disorder, DM, DDD, chronic back pain, asthma. Lumbar fusion, lumbar disc arthroplasty, cervical spine surgery. Dx COVID 10/30 and developed SOB. Presented to ED 11/6 w/ fever and hypoxia.    PT Comments    Patient doing much better than on evaluation. On room air. Reports walking in room independently (and notes she has not been monitoring her sats when walking--she unplugs from the monitor). Assessed walking SpO2 and pt maintained >90% throughout. Educated on incentive spirometer and flutter valve and pt reports she is using regularly. Patient anticipates discharge home tomorrow after Remdesivir. Functionally she is ready.     Follow Up Recommendations  No PT follow up     Equipment Recommendations  None recommended by PT    Recommendations for Other Services       Precautions / Restrictions Precautions Precautions: Fall Precaution Comments: hx of several back surgeries Restrictions Weight Bearing Restrictions: No    Mobility  Bed Mobility               General bed mobility comments: up in chair  Transfers Overall transfer level: Independent Equipment used: None Transfers: Sit to/from Stand Sit to Stand: Independent            Ambulation/Gait Ambulation/Gait assistance: Supervision;Modified independent (Device/Increase time) Gait Distance (Feet): 100 Feet Assistive device: None Gait Pattern/deviations: Wide base of support;Step-to pattern;Shuffle     General Gait Details: ambulated in hallway on room air with sats >=90% throughout   Stairs             Wheelchair Mobility    Modified Rankin (Stroke Patients Only)       Balance Overall balance assessment: Independent Sitting-balance support: Feet supported Sitting balance-Leahy Scale: Normal     Standing balance support: During functional activity;No upper extremity supported Standing balance-Leahy Scale: Good                              Cognition Arousal/Alertness: Awake/alert Behavior During Therapy: WFL for tasks assessed/performed Overall Cognitive Status: Within Functional Limits for tasks assessed                                        Exercises Other Exercises Other Exercises: incentive spirometer x 5 able to pull 1250ml Other Exercises: flutter valve x 5    General Comments        Pertinent Vitals/Pain Pain Assessment: No/denies pain    Home Living                      Prior Function            PT Goals (current goals can now be found in the care plan section) Acute Rehab PT Goals Patient Stated Goal: be healthy again Time For Goal Achievement: 02/21/19 Potential to Achieve Goals: Good Progress towards PT goals: Progressing toward goals    Frequency    Min 3X/week      PT Plan Discharge plan needs to be updated    Co-evaluation              AM-PAC PT "6  Clicks" Mobility   Outcome Measure  Help needed turning from your back to your side while in a flat bed without using bedrails?: A Little Help needed moving from lying on your back to sitting on the side of a flat bed without using bedrails?: A Little Help needed moving to and from a bed to a chair (including a wheelchair)?: A Little Help needed standing up from a chair using your arms (e.g., wheelchair or bedside chair)?: None Help needed to walk in hospital room?: None Help needed climbing 3-5 steps with a railing? : A Little 6 Click Score: 20    End of Session   Activity Tolerance: Patient tolerated treatment well Patient left: in chair;with call bell/phone within reach;with chair alarm set   PT Visit  Diagnosis: Unsteadiness on feet (R26.81);Muscle weakness (generalized) (M62.81)     Time: [id number] PT Time Calculation (min) (ACUTE ONLY): 24 min  Charges:  $Gait Training: 23-37 mins                      Barry Brunner, PT     Rexanne Mano 02/09/2019, 5:52 PM

## 2019-02-10 ENCOUNTER — Ambulatory Visit (HOSPITAL_COMMUNITY): Payer: PPO | Admitting: Physical Therapy

## 2019-02-10 ENCOUNTER — Encounter (HOSPITAL_COMMUNITY): Payer: PPO | Admitting: Physical Therapy

## 2019-02-10 DIAGNOSIS — E871 Hypo-osmolality and hyponatremia: Secondary | ICD-10-CM

## 2019-02-10 DIAGNOSIS — L4050 Arthropathic psoriasis, unspecified: Secondary | ICD-10-CM

## 2019-02-10 LAB — COMPREHENSIVE METABOLIC PANEL
ALT: 54 U/L — ABNORMAL HIGH (ref 0–44)
AST: 43 U/L — ABNORMAL HIGH (ref 15–41)
Albumin: 2.7 g/dL — ABNORMAL LOW (ref 3.5–5.0)
Alkaline Phosphatase: 56 U/L (ref 38–126)
Anion gap: 8 (ref 5–15)
BUN: 24 mg/dL — ABNORMAL HIGH (ref 8–23)
CO2: 20 mmol/L — ABNORMAL LOW (ref 22–32)
Calcium: 8.3 mg/dL — ABNORMAL LOW (ref 8.9–10.3)
Chloride: 109 mmol/L (ref 98–111)
Creatinine, Ser: 0.84 mg/dL (ref 0.44–1.00)
GFR calc Af Amer: >60 mL/min (ref >60)
GFR calc non Af Amer: >60 mL/min (ref >60)
Glucose, Bld: 125 mg/dL — ABNORMAL HIGH (ref 70–99)
Potassium: 4.3 mmol/L (ref 3.5–5.1)
Sodium: 137 mmol/L (ref 135–145)
Total Bilirubin: 0.3 mg/dL (ref 0.3–1.2)
Total Protein: 5.6 g/dL — ABNORMAL LOW (ref 6.5–8.1)

## 2019-02-10 LAB — CBC WITH DIFFERENTIAL/PLATELET
Abs Immature Granulocytes: 0.16 K/uL — ABNORMAL HIGH (ref 0.00–0.07)
Basophils Absolute: 0 K/uL (ref 0.0–0.1)
Basophils Relative: 0 %
Eosinophils Absolute: 0 K/uL (ref 0.0–0.5)
Eosinophils Relative: 0 %
HCT: 30 % — ABNORMAL LOW (ref 36.0–46.0)
Hemoglobin: 9.7 g/dL — ABNORMAL LOW (ref 12.0–15.0)
Immature Granulocytes: 1 %
Lymphocytes Relative: 4 %
Lymphs Abs: 0.6 K/uL — ABNORMAL LOW (ref 0.7–4.0)
MCH: 31.9 pg (ref 26.0–34.0)
MCHC: 32.3 g/dL (ref 30.0–36.0)
MCV: 98.7 fL (ref 80.0–100.0)
Monocytes Absolute: 0.4 K/uL (ref 0.1–1.0)
Monocytes Relative: 3 %
Neutro Abs: 13.5 K/uL — ABNORMAL HIGH (ref 1.7–7.7)
Neutrophils Relative %: 92 %
Platelets: 200 K/uL (ref 150–400)
RBC: 3.04 MIL/uL — ABNORMAL LOW (ref 3.87–5.11)
RDW: 13.3 % (ref 11.5–15.5)
WBC: 14.7 K/uL — ABNORMAL HIGH (ref 4.0–10.5)
nRBC: 0 % (ref 0.0–0.2)

## 2019-02-10 LAB — MAGNESIUM: Magnesium: 2.2 mg/dL (ref 1.7–2.4)

## 2019-02-10 LAB — D-DIMER, QUANTITATIVE: D-Dimer, Quant: 0.68 ug{FEU}/mL — ABNORMAL HIGH (ref 0.00–0.50)

## 2019-02-10 LAB — C-REACTIVE PROTEIN: CRP: 6.5 mg/dL — ABNORMAL HIGH (ref <1.0)

## 2019-02-10 LAB — GLUCOSE, CAPILLARY: Glucose-Capillary: 112 mg/dL — ABNORMAL HIGH (ref 70–99)

## 2019-02-10 LAB — PHOSPHORUS: Phosphorus: 2.2 mg/dL — ABNORMAL LOW (ref 2.5–4.6)

## 2019-02-10 MED ORDER — PREDNISONE 1 MG PO TABS: 1.0000 mg | ORAL_TABLET | Freq: Every day | ORAL | 0 refills | Status: DC

## 2019-02-10 MED ORDER — DEXAMETHASONE 4 MG PO TABS
2.0000 mg | ORAL_TABLET | Freq: Every day | ORAL | Status: DC
  Administered 2019-02-10: 2 mg via ORAL
  Filled 2019-02-10: qty 1

## 2019-02-10 MED ORDER — PREDNISONE 5 MG PO TABS: 5.0000 mg | ORAL_TABLET | Freq: Every morning | ORAL | 0 refills | Status: DC

## 2019-02-10 MED ORDER — AZITHROMYCIN 250 MG PO TABS: 500.0000 mg | ORAL_TABLET | Freq: Every evening | ORAL | Status: DC

## 2019-02-10 NOTE — Progress Notes (Signed)
Occupational Therapy Treatment Patient Details Name: Tonya Hall MRN: [id number] DOB: 1950-05-20 Today's Date: 02/10/2019    History of present illness 68 y/o female w/ hx of SUI, Raynaud's disease, psoriatic arthritis, peripheral edema, osteoporosis, neuropathy, lymphocytic colitis, HLD, bronchitis, GERD, esophageal motility disorder, DM, DDD, chronic back pain, asthma. Lumbar fusion, lumbar disc arthroplasty, cervical spine surgery. Dx COVID 10/30 and developed SOB. Presented to ED 11/6 w/ fever and hypoxia.   OT comments  Pt progressing towards established OT goals. Pt reporting she feels much better today. Pt performing functional mobility in hallway to simulated home distance with Supervision-Min Guard A; SpO2 >90% on RA throughout with minimal SOB. Provided handout and education on [facility] for ADLs and IADLs; pt verbalized understanding. Continue to recommend dc to home once medically stable. Provided education and answered questions in preparation for possible dc later today.    Follow Up Recommendations  No OT follow up;Supervision - Intermittent    Equipment Recommendations  None recommended by OT    Recommendations for Other Services      Precautions / Restrictions Precautions Precautions: Fall Precaution Comments: hx of several back surgeries Restrictions Weight Bearing Restrictions: No       Mobility Bed Mobility Overal bed mobility: Needs Assistance Bed Mobility: Supine to Sit     Supine to sit: Supervision     General bed mobility comments: Increased time as needed  Transfers Overall transfer level: Needs assistance Equipment used: None Transfers: Sit to/from Stand Sit to Stand: Supervision         General transfer comment: Supervision for safety    Balance Overall balance assessment: Independent Sitting-balance support: Feet supported Sitting balance-Leahy Scale: Normal     Standing balance support: During functional activity;No upper extremity  supported Standing balance-Leahy Scale: Good                             ADL either performed or assessed with clinical judgement   ADL Overall ADL's : Needs assistance/impaired                 Upper Body Dressing : Set up;Sitting Upper Body Dressing Details (indicate cue type and reason): Donning second gown like a jacket     Toilet Transfer: Supervision/safety;Ambulation(simulated to recliner)           Functional mobility during ADLs: Supervision/safety;Min guard General ADL Comments: Pt demonstrating increased acitvity tolerance. Pt performing functional mobility in hallway with Minimal SOB and SpO2 maintaining in 90s on RA. Providing education on [facility] for ADLs and IADLs and pt verbalized understanding     Vision       Perception     Praxis      Cognition Arousal/Alertness: Awake/alert Behavior During Therapy: WFL for tasks assessed/performed Overall Cognitive Status: Within Functional Limits for tasks assessed                                          Exercises Exercises: Other exercises Other Exercises Other Exercises: Provided handout and education on EC for ADLs and IADLs. Pt verbalizing ways she already impliments EC as well as new ways she plans to incorporate.    Shoulder Instructions       General Comments      Pertinent Vitals/ Pain       Pain Assessment: No/denies pain  Home Living  Prior Functioning/Environment              Frequency  Min 2X/week        Progress Toward Goals  OT Goals(current goals can now be found in the care plan section)  Progress towards OT goals: Progressing toward goals  Acute Rehab OT Goals Patient Stated Goal: be healthy again OT Goal Formulation: With patient Time For Goal Achievement: 02/21/19 Potential to Achieve Goals: Good ADL Goals Pt Will Perform Tub/Shower Transfer: with modified independence;shower  seat;ambulating Pt/caregiver will Perform Home Exercise Program: Increased strength;Both right and left upper extremity;With written HEP provided Additional ADL Goal #1: Pt will self monitor and maintain O2 sats above 90% for safe and successful completion of BADL Additional ADL Goal #2: Pt will recall and apply 1-3 ECS strategies as it applies to BADL activities  Plan Discharge plan remains appropriate    Co-evaluation                 AM-PAC OT "6 Clicks" Daily Activity     Outcome Measure   Help from another person eating meals?: None Help from another person taking care of personal grooming?: None Help from another person toileting, which includes using toliet, bedpan, or urinal?: None Help from another person bathing (including washing, rinsing, drying)?: A Little Help from another person to put on and taking off regular upper body clothing?: None Help from another person to put on and taking off regular lower body clothing?: A Little 6 Click Score: 22    End of Session    OT Visit Diagnosis: Unsteadiness on feet (R26.81);Other abnormalities of gait and mobility (R26.89);Muscle weakness (generalized) (M62.81)   Activity Tolerance Patient tolerated treatment well   Patient Left in chair;with call bell/phone within reach   Nurse Communication Mobility status        Time: [id number] OT Time Calculation (min): 22 min  Charges: OT General Charges $OT Visit: 1 Visit OT Treatments $Self Care/Home Management : 8-22 mins  [place], OTR/L Acute Rehab Pager: [phone] Office: [place] 02/10/2019, 11:00 AM

## 2019-02-10 NOTE — Discharge Summary (Signed)
Physician Discharge Summary  Tonya Hall [id number] DOB: 11/07/1950 DOA: 02/06/2019  PCP: Tonya Noble, MD  Admit date: 02/06/2019 Discharge date: 02/10/2019  Admitted From: Home Disposition:  Home  Recommendations for Outpatient Follow-up:  1. Follow up with PCP in 1-2 weeks   Home Health:No Equipment/Devices:None  Discharge Condition:Stable CODE STATUS:Full   Brief/Interim Summary: 68 y.o. female past medical history of asthma, diabetes mellitus, essential hypertension, psoriatic arthritis on chronic steroids with degenerative disc disease and neuropathy comes into the hospital as she had a SARS-CoV-2 test positive on 01/30/2019 and developed shortness of breath.  She relates she visited her family in [place] 2 weeks prior to being diagnosed she started developing cough and shortness of breath about 10 days ago, was treated with prednisone doxycycline completed her treatment but there was no significant improvement.  She relates she also had diarrhea which is now resolved.  2 days prior to admission she started developing cough and shortness of breath issue satting 79% on room air she was placed on nasal cannula, chest x-ray showed right lower lobe density, procalcitonin was 0.2.   Discharge Diagnoses:  Principal Problem:   Acute respiratory failure with hypoxia (HCC) Active Problems:   GERD   Hypertension   Hypercholesteremia   Psoriatic arthritis (HCC)   Chronic pain syndrome   Asthma   Diabetes mellitus without complication (HCC)   Acute respiratory disease due to COVID-19 virus   Community acquired pneumonia   Hyponatremia   AKI (acute kidney injury) ([place]) Acute respiratory failure with hypoxia due to viral pneumonia due to COVID-19: Initially she required 2 to 3 L of oxygen to keep saturations greater than 94%. She was started on IV Rocephin and azithromycin which she completed 4-day course as a procalcitonin was 0.2 and she remained afebrile these were  stopped. She was started on admission IV remdesivir and steroids for which she completed a 5-day course, she was also started on vitamin C and zinc.  Hypovolemic hyponatremia: Resolved with IV fluid hydration.  Acute kidney injury: Likely prerenal azotemia in the setting of decreased oral intake and diarrhea this resolved after fluid hydration.  Diabetes mellitus type 2 in the setting of chronic steroid use: Her A1c is 6.4, she was covered with long-acting insulin and sliding scale.  History of asthma: Seems to be stable continue inhalers.  Essential hypertension: No changes made to her medication.  Psoriatic arthritis: Continue leflunomide, she will go on a slow steroid taper till she gets a 5 mg and she will continue 5 mg daily which is her home dose.   Discharge Instructions  Discharge Instructions    Diet - low sodium heart healthy   Complete by: As directed    Increase activity slowly   Complete by: As directed    MyChart COVID-19 home monitoring program   Complete by: Feb 10, 2019    Is the patient willing to use the [facility] for home monitoring?: Yes   Temperature monitoring   Complete by: Feb 10, 2019    After how many days would you like to receive a notification of this patient's flowsheet entries?: 1     Allergies as of 02/10/2019      Reactions   Adalimumab Rash, Other (See Comments)   FEVER, Fast pulse   Imuran [azathioprine Sodium] Rash   Denies Airway involvement   Methotrexate Other (See Comments)   Liver Enzyme elevation - after 1 month of use   Plaquenil [hydroxychloroquine Sulfate] Rash   Denies  airway involvement.    Sulfonamide Derivatives Rash   Occurred with Sulfasalazine. Rash only.    Ceftin [cefuroxime Axetil] Nausea Only   Upset stomach   Morphine Nausea And Vomiting   IV morphine caused N and V  After surgery.   Has tolerated Kadian in the past       Medication List    TAKE these medications   albuterol 108 (90 Base)  MCG/ACT inhaler Commonly known as: VENTOLIN HFA Inhale 2 puffs into the lungs every 6 (six) hours as needed for wheezing or shortness of breath.   aspirin 81 MG tablet Take 81 mg by mouth daily.   CALCIUM 600 + D PO Take 1 tablet by mouth 2 (two) times daily.   cetirizine 10 MG tablet Commonly known as: ZYRTEC Take 10 mg by mouth daily as needed for allergies.   Cosentyx Sensoready Pen 150 MG/ML So[place] Generic drug: Secukinumab Inject 150 mg into the skin every 14 (fourteen) days.   diclofenac sodium 1 % Gel Commonly known as: VOLTAREN Apply 2 g topically 3 (three) times daily as needed (pain).   fish oil-omega-3 fatty acids 1000 MG capsule Take 2 g by mouth daily.   gabapentin 300 MG capsule Commonly known as: NEURONTIN Take 300 mg by mouth 4 (four) times daily.   Ginger Root 500 MG Caps Take 1 capsule by mouth daily.   Glucosamine Sulfate 1000 MG Caps Take 2 capsules (2,000 mg total) by mouth daily.   guaiFENesin-dextromethorphan 100-10 MG/5ML syrup Commonly known as: ROBITUSSIN DM Take 10 mLs by mouth every 6 (six) hours as needed for cough.   HumuLIN N KwikPen 100 UNIT/ML Kiwkpen Generic drug: Insulin NPH (Human) (Isophane) Inject 10 Units into the skin at bedtime.   HYDROcodone-acetaminophen 5-325 MG tablet Commonly known as: NORCO/VICODIN Take 1 tablet by mouth 2-3 times daily as needed for pain   leflunomide 20 MG tablet Commonly known as: ARAVA TAKE 1 TABLET BY MOUTH ONCE DAILY.   lidocaine 2 % solution Commonly known as: XYLOCAINE 2 TSP  PO qac/hs PRN FOR upper ABDOMINAL OR CHEST PAIN. MAY REPEAT DOSE EVERY 4 HOURS. NO MORE THAN 8 DOSES A DAY.   losartan 100 MG tablet Commonly known as: COZAAR Take 100 mg by mouth daily.   metoprolol succinate 50 MG 24 hr tablet Commonly known as: TOPROL-XL Take 1 tablet (50 mg total) by mouth daily. Take with or immediately following a meal.   multivitamin tablet Take 1 tablet by mouth daily.   nortriptyline  25 MG capsule Commonly known as: PAMELOR TAKE (1) CAPSULE BY MOUTH AT BEDTIME. What changed: See the new instructions.   omeprazole 20 MG capsule Commonly known as: PRILOSEC TAKE ONE CAPSULE BY MOUTH TWICE DAILY BEFORE MEALS.   OneTouch Verio test strip Generic drug: glucose blood   potassium chloride SA 20 MEQ tablet Commonly known as: KLOR-CON Take 20 mEq by mouth daily.   predniSONE 1 MG tablet Commonly known as: DELTASONE Take 1 tablet (1 mg total) by mouth daily. Take 8 mg for 3 days, then 7 mg for 3 days, then 6 mg for 3 days, then continue 5 mg daily. What changed: You were already taking a medication with the same name, and this prescription was added. Make sure you understand how and when to take each.   predniSONE 5 MG tablet Commonly known as: DELTASONE Take 1 tablet (5 mg total) by mouth every morning. Start taking on: February 15, 2019 What changed: These instructions start on February 15, 2019. If you are unsure what to do until then, ask your doctor or other care provider.   RECLAST IV Inject into the vein.   Restasis Multidose 0.05 % ophthalmic emulsion Generic drug: cycloSPORINE Place 1 drop into both eyes 2 (two) times daily.   Solu-CORTEF 100 MG Solr injection Generic drug: hydrocortisone sodium succinate Inject 100 mg into the muscle See admin instructions. Take as needed when unable to swallow prednisone tablets   spironolactone 25 MG tablet Commonly known as: ALDACTONE Take 25 mg by mouth daily.   Sure Comfort Insulin Syringe 31G X 5/16" 0.3 ML Misc Generic drug: Insulin Syringe-Needle U-100   Sure Comfort Pen Needles 31G X 5 MM Misc Generic drug: Insulin Pen Needle   TART CHERRY ADVANCED PO Take 1 tablet by mouth daily. daily   topiramate 200 MG tablet Commonly known as: TOPAMAX TAKE (1) TABLET BY MOUTH AT BEDTIME.   Turmeric 500 MG Caps Take 1 capsule by mouth daily.       Allergies  Allergen Reactions  . Adalimumab Rash and Other  (See Comments)    FEVER, Fast pulse  . Imuran [Azathioprine Sodium] Rash    Denies Airway involvement  . Methotrexate Other (See Comments)    Liver Enzyme elevation - after 1 month of use  . Plaquenil [Hydroxychloroquine Sulfate] Rash    Denies airway involvement.   . Sulfonamide Derivatives Rash    Occurred with Sulfasalazine. Rash only.   . Ceftin [Cefuroxime Axetil] Nausea Only    Upset stomach  . Morphine Nausea And Vomiting    IV morphine caused N and V  After surgery.   Has tolerated Kadian in the past     Consultations:  none   Procedures/Studies: Dg Chest Port 1 View  Result Date: 02/06/2019 CLINICAL DATA:  COVID-19, hypoxia, shortness of breath EXAM: PORTABLE CHEST 1 VIEW COMPARISON:  None. FINDINGS: The heart size and mediastinal contours are within normal limits. There is heterogeneous bilateral airspace opacity, most conspicuous in the right upper lobe. The visualized skeletal structures are unremarkable. IMPRESSION: There is heterogeneous bilateral airspace opacity, most conspicuous in the right upper lobe. Findings are consistent with reported COVID-19 infection. Electronically Signed   By: Eddie Candle M.D.   On: 02/06/2019 18:06    Subjective: No complaints feels great.  Discharge Exam: Vitals:   02/10/19 0453 02/10/19 0730  BP: (!) 161/74 130/80  Pulse:  79  Resp:  18  Temp: 97.8 F (36.6 C) 98.1 F (36.7 C)  SpO2:  97%   Vitals:   02/09/19 1600 02/09/19 1946 02/10/19 0453 02/10/19 0730  BP: 130/76 (!) 127/57 (!) 161/74 130/80  Pulse: 90   79  Resp:    18  Temp: 98 F (36.7 C) 97.8 F (36.6 C) 97.8 F (36.6 C) 98.1 F (36.7 C)  TempSrc: Oral Oral Oral Oral  SpO2: 100% 98%  97%  Weight:      Height:        General: Pt is alert, awake, not in acute distress Cardiovascular: RRR, S1/S2 +, no rubs, no gallops Respiratory: CTA bilaterally, no wheezing, no rhonchi Abdominal: Soft, NT, ND, bowel sounds + Extremities: no edema, no  cyanosis    The results of significant diagnostics from this hospitalization (including imaging, microbiology, ancillary and laboratory) are listed below for reference.     Microbiology: Recent Results (from the past 240 hour(s))  Blood Culture (routine x 2)     Status: None (Preliminary result)   Collection Time:  02/06/19  5:11 PM   Specimen: BLOOD LEFT ARM  Result Value Ref Range Status   Specimen Description BLOOD LEFT ARM  Final   Special Requests   Final    BOTTLES DRAWN AEROBIC AND ANAEROBIC Blood Culture adequate volume   Culture   Final    NO GROWTH 4 DAYS Performed at [facility], [address], [place], [place] [address]    Report Status PENDING  Incomplete  Blood Culture (routine x 2)     Status: None (Preliminary result)   Collection Time: 02/06/19  5:12 PM   Specimen: Right Antecubital; Blood  Result Value Ref Range Status   Specimen Description RIGHT ANTECUBITAL  Final   Special Requests   Final    BOTTLES DRAWN AEROBIC AND ANAEROBIC Blood Culture adequate volume   Culture   Final    NO GROWTH 4 DAYS Performed at [facility], [address] [place], [place] [address]    Report Status PENDING  Incomplete     Labs: BNP (last 3 results) No results for input(s): BNP in the last 8760 hours. Basic Metabolic Panel: Recent Labs  Lab 02/06/19 1711 02/07/19 0617 02/08/19 0633 02/09/19 0410 02/10/19 0501  NA 126* 132* 132* 135 137  K 4.0 4.4 4.3 4.3 4.3  CL 97* 100 108 108 109  CO2 19* 20* 17* 18* 20*  GLUCOSE 114* 160* 130* 162* 125*  BUN 23 21 21  26* 24*  CREATININE 1.12* 0.93 0.79 0.91 0.84  CALCIUM 8.4* 8.4* 7.8* 8.3* 8.3*  MG  --  2.0 2.2 2.3 2.2  PHOS  --  4.0 2.1* 2.1* 2.2*   Liver Function Tests: Recent Labs  Lab 02/06/19 1711 02/07/19 0617 02/08/19 0633 02/09/19 0410 02/10/19 0501  AST 38 30 37 33 43*  ALT 42 36 38 39 54*  ALKPHOS 53 47 44 54 56  BILITOT 0.7 0.2* 0.2* 0.2* 0.3  PROT 6.7 6.3* 5.8* 5.6* 5.6*  ALBUMIN 3.4* 3.0* 2.7*  2.6* 2.7*   No results for input(s): LIPASE, AMYLASE in the last 168 hours. No results for input(s): AMMONIA in the last 168 hours. CBC: Recent Labs  Lab 02/06/19 1711 02/07/19 0617 02/08/19 0633 02/09/19 0410 02/10/19 0501  WBC 14.0* 9.5 14.3* 19.8* 14.7*  NEUTROABS 12.0* 8.5* 13.4* 18.4* 13.5*  HGB 12.1 11.4* 9.6* 9.4* 9.7*  HCT 37.7 35.7* 29.5* 29.0* 30.0*  MCV 100.0 98.9 97.4 100.3* 98.7  PLT 163 163 164 193 200   Cardiac Enzymes: No results for input(s): CKTOTAL, CKMB, CKMBINDEX, TROPONINI in the last 168 hours. BNP: Invalid input(s): POCBNP CBG: Recent Labs  Lab 02/09/19 0735 02/09/19 1137 02/09/19 1752 02/09/19 2053 02/10/19 0752  GLUCAP 204* 272* 211* 248* 112*   D-Dimer Recent Labs    02/09/19 0410 02/10/19 0501  DDIMER 0.56* 0.68*   Hgb A1c No results for input(s): HGBA1C in the last 72 hours. Lipid Profile No results for input(s): CHOL, HDL, LDLCALC, TRIG, CHOLHDL, LDLDIRECT in the last 72 hours. Thyroid function studies No results for input(s): TSH, T4TOTAL, T3FREE, THYROIDAB in the last 72 hours.  Invalid input(s): FREET3 Anemia work up No results for input(s): VITAMINB12, FOLATE, FERRITIN, TIBC, IRON, RETICCTPCT in the last 72 hours. Urinalysis    Component Value Date/Time   BILIRUBINUR n 08/12/2015 1334   PROTEINUR n 08/12/2015 1334   UROBILINOGEN negative 08/12/2015 1334   NITRITE n 08/12/2015 1334   LEUKOCYTESUR Negative 08/12/2015 1334   Sepsis Labs Invalid input(s): PROCALCITONIN,  WBC,  LACTICIDVEN Microbiology Recent Results (  from the past 240 hour(s))  Blood Culture (routine x 2)     Status: None (Preliminary result)   Collection Time: 02/06/19  5:11 PM   Specimen: BLOOD LEFT ARM  Result Value Ref Range Status   Specimen Description BLOOD LEFT ARM  Final   Special Requests   Final    BOTTLES DRAWN AEROBIC AND ANAEROBIC Blood Culture adequate volume   Culture   Final    NO GROWTH 4 DAYS Performed at [facility], [address] [place], [place] [address]    Report Status PENDING  Incomplete  Blood Culture (routine x 2)     Status: None (Preliminary result)   Collection Time: 02/06/19  5:12 PM   Specimen: Right Antecubital; Blood  Result Value Ref Range Status   Specimen Description RIGHT ANTECUBITAL  Final   Special Requests   Final    BOTTLES DRAWN AEROBIC AND ANAEROBIC Blood Culture adequate volume   Culture   Final    NO GROWTH 4 DAYS Performed at [facility], [address] [place], [place] [address]    Report Status PENDING  Incomplete     Time coordinating discharge: Over 30 minutes  SIGNED:   Charlynne Cousins, MD  [facility] 02/10/2019, 9:39 AM Pager   If 7PM-7AM, please contact night-coverage [email or web address] Password [email or web address]

## 2019-02-11 LAB — CULTURE, BLOOD (ROUTINE X 2)
Culture: NO GROWTH
Culture: NO GROWTH
Special Requests: ADEQUATE
Special Requests: ADEQUATE

## 2019-02-12 ENCOUNTER — Encounter (HOSPITAL_COMMUNITY): Payer: PPO | Admitting: Physical Therapy

## 2019-02-13 ENCOUNTER — Other Ambulatory Visit: Payer: Self-pay | Admitting: Rheumatology

## 2019-02-13 NOTE — Telephone Encounter (Signed)
Last Visit: 01/14/2019 Next Visit: 06/16/2019  Okay to refill per Dr. Estanislado Pandy.

## 2019-02-17 ENCOUNTER — Encounter (HOSPITAL_COMMUNITY): Payer: PPO | Admitting: Physical Therapy

## 2019-02-19 ENCOUNTER — Encounter (HOSPITAL_COMMUNITY): Payer: PPO | Admitting: Physical Therapy

## 2019-03-02 DIAGNOSIS — Z23 Encounter for immunization: Secondary | ICD-10-CM | POA: Diagnosis not present

## 2019-03-03 ENCOUNTER — Telehealth: Payer: Self-pay | Admitting: Pharmacy Technician

## 2019-03-03 NOTE — Telephone Encounter (Signed)
Returned patient's call, she received [employer] application in the mail. Advised patient that she can complete her portion and send directly to the manufacturer. We are working on the provider portions and will send those in.  2:04 PM Beatriz Chancellor, CPhT

## 2019-03-04 ENCOUNTER — Telehealth: Payer: Self-pay | Admitting: Rheumatology

## 2019-03-04 NOTE — Telephone Encounter (Signed)
Patient left a message requesting refill on Hydrocodone.

## 2019-03-04 NOTE — Telephone Encounter (Signed)
Last Visit: 01/14/2019 Next Visit: 06/16/2019 UDS:01/14/2019 c/w Narc Agreement: 01/14/2019  Last fill: 01/08/2019   Okay to refill hydrocodone?

## 2019-03-05 MED ORDER — HYDROCODONE-ACETAMINOPHEN 5-325 MG PO TABS: ORAL_TABLET | ORAL | 0 refills | Status: DC

## 2019-03-06 ENCOUNTER — Other Ambulatory Visit: Payer: Self-pay

## 2019-03-06 ENCOUNTER — Encounter (HOSPITAL_COMMUNITY)
Admission: RE | Admit: 2019-03-06 | Discharge: 2019-03-06 | Disposition: A | Discharge status: Home / Self Care | Payer: PPO | Source: Ambulatory Visit | Attending: Rheumatology | Admitting: Rheumatology

## 2019-03-06 ENCOUNTER — Encounter (HOSPITAL_COMMUNITY): Payer: Self-pay

## 2019-03-06 DIAGNOSIS — M810 Age-related osteoporosis without current pathological fracture: Secondary | ICD-10-CM | POA: Insufficient documentation

## 2019-03-06 LAB — POCT I-STAT, CHEM 8
BUN: 15 mg/dL (ref 8–23)
Calcium, Ion: 1.1 mmol/L — ABNORMAL LOW (ref 1.15–1.40)
Chloride: 108 mmol/L (ref 98–111)
Creatinine, Ser: 0.9 mg/dL (ref 0.44–1.00)
Glucose, Bld: 179 mg/dL — ABNORMAL HIGH (ref 70–99)
HCT: 37 % (ref 36.0–46.0)
Hemoglobin: 12.6 g/dL (ref 12.0–15.0)
Potassium: 3.8 mmol/L (ref 3.5–5.1)
Sodium: 139 mmol/L (ref 135–145)
TCO2: 21 mmol/L — ABNORMAL LOW (ref 22–32)

## 2019-03-06 MED ORDER — ZOLEDRONIC ACID 5 MG/100ML IV SOLN
5.0000 mg | Freq: Once | INTRAVENOUS | Status: AC
  Administered 2019-03-06: 5 mg via INTRAVENOUS

## 2019-03-06 MED ORDER — SODIUM CHLORIDE 0.9 % IV SOLN
Freq: Once | INTRAVENOUS | Status: AC
  Administered 2019-03-06: 10:00:00 via INTRAVENOUS

## 2019-03-09 ENCOUNTER — Other Ambulatory Visit: Payer: Self-pay | Admitting: Rheumatology

## 2019-03-09 NOTE — Telephone Encounter (Signed)
Last Visit: 01/14/2019 Next Visit: 06/16/2019 Labs: 02/10/2019 stable except AST 43, ALT 54.  Okay to refill arava?

## 2019-03-09 NOTE — Telephone Encounter (Signed)
Okay to refill leflunomide.  Patient should have repeat CMP in 1 month due to elevated LFTs.  Please advise her to avoid all NSAIDs, Tylenol and alcohol.

## 2019-03-09 NOTE — Telephone Encounter (Signed)
Advised patient she should have repeat CMP in 1 month due to elevated LFTs. Advised patient to avoid all NSAIDs, tylenol and alcohol. Patient verbalized understanding.

## 2019-03-18 ENCOUNTER — Encounter (HOSPITAL_COMMUNITY): Payer: Self-pay | Admitting: Physical Therapy

## 2019-03-18 NOTE — Therapy (Unsigned)
[facility] [address] [place], [place], [address] Phone: [phone]   Fax:  [phone]  Patient Details  Name: Tonya Hall MRN: [id number] Date of Birth: February 16, 1951 Referring Provider:  No ref. provider found  Encounter Date: 03/18/2019 PHYSICAL THERAPY DISCHARGE SUMMARY  Visits from Start of Care: 12  Current functional level related to goals / functional outcomes: Neck pain had been decreasing; pt got Covid and cancelled remaining apppointments.  Remaining deficits: Unknown no reassessment available.    Education / Equipment: HEP Plan: Patient agrees to discharge.  Patient goals were not met. Patient is being discharged due to not returning since the last visit.  ?????       Rayetta Humphrey, PT CLT [phone] 03/18/2019, 12:00 PM  [facility] [address] [place], [place], [address] Phone: [phone]   Fax:  [phone]

## 2019-03-23 DIAGNOSIS — E119 Type 2 diabetes mellitus without complications: Secondary | ICD-10-CM | POA: Diagnosis not present

## 2019-03-23 DIAGNOSIS — E2749 Other adrenocortical insufficiency: Secondary | ICD-10-CM | POA: Diagnosis not present

## 2019-03-23 DIAGNOSIS — M810 Age-related osteoporosis without current pathological fracture: Secondary | ICD-10-CM | POA: Diagnosis not present

## 2019-03-23 DIAGNOSIS — E559 Vitamin D deficiency, unspecified: Secondary | ICD-10-CM | POA: Diagnosis not present

## 2019-03-23 DIAGNOSIS — I10 Essential (primary) hypertension: Secondary | ICD-10-CM | POA: Diagnosis not present

## 2019-03-23 DIAGNOSIS — E7800 Pure hypercholesterolemia, unspecified: Secondary | ICD-10-CM | POA: Diagnosis not present

## 2019-04-06 NOTE — Progress Notes (Signed)
Office Visit Note  Patient: Tonya Hall             Date of Birth: [birth year]           MRN: [id number]             PCP: [name], MD Referring: [name], MD Visit Date: 04/07/2019 Occupation: @GUAROCC @  Subjective:  Left SI joint pain   History of Present Illness: Tonya Hall is a 69 y.o. female with history psoriatic arthritis and osteoarthritis.  She is prescribed Cosentyx 150 mg sq injections once every 14 days, Arava 20 mg po daily, prednisone 5 mg po daily, and hydrocodone as needed for pain relief.  She reports she was discharged from the hospital on 02/10/19 for the treatment of covid pneumonia. She states she had to hold cosentyx and Arava for about 1 month. She is having increased pain in both hands and the right knee joint.  She states the right knee joint will occasionally buckle.  She has been having left SI joint pain for the past 1 month. She would like a cortisone injection today.  She is also having right shoulder joint discomfort, which is radiating down her right arm.   Activities of Daily Living:  Patient reports morning stiffness all day  Patient Denies nocturnal pain.  Difficulty dressing/grooming: Reports Difficulty climbing stairs: Reports Difficulty getting out of chair: Reports Difficulty using hands for taps, buttons, cutlery, and/or writing: Reports  Review of Systems  Constitutional: Positive for fatigue.  HENT: Positive for mouth dryness. Negative for mouth sores and nose dryness.   Eyes: Positive for dryness. Negative for pain and redness.  Respiratory: Positive for shortness of breath.   Cardiovascular: Negative for chest pain and palpitations.  Gastrointestinal: Negative for blood in stool, constipation and diarrhea.  Endocrine: Negative for increased urination.  Genitourinary: Negative for difficulty urinating and painful urination.  Musculoskeletal: Positive for arthralgias, joint pain, joint swelling and morning stiffness.  Skin:  Negative for rash.  Allergic/Immunologic: Negative for susceptible to infections.  Neurological: Positive for headaches. Negative for dizziness, memory loss and weakness.  Hematological: Positive for bruising/bleeding tendency.  Psychiatric/Behavioral: Negative for confusion and sleep disturbance.    PMFS History:  Patient Active Problem List   Diagnosis Date Noted  . AKI (acute kidney injury) ([place]) 02/07/2019  . Acute respiratory failure with hypoxia ([place]) 02/06/2019  . Acute respiratory disease due to COVID-19 virus 02/06/2019  . Community acquired pneumonia 02/06/2019  . Hyponatremia 02/06/2019  . Asthma   . Diabetes mellitus without complication ([place])   . Closed nondisplaced odontoid fracture with type II morphology ([place]) 01/27/2018  . Odontoid fracture ([place]) 01/24/2018  . Chest pain 11/14/2017  . Transaminitis 06/22/2016  . Collagenous colitis 05/23/2016  . Psoriasis 05/21/2016  . High risk medication use 02/15/2016  . Age-related osteoporosis without current pathological fracture 02/15/2016  . Trochanteric bursitis, left hip 02/15/2016  . Sacroiliitis, not elsewhere classified ([place]) 02/15/2016  . Chronic pain syndrome 02/15/2016  . Abnormal weight gain 06/25/2012  . Hypertension 07/03/2011  . Hypercholesteremia 07/03/2011  . Psoriatic arthritis ([place]) 07/03/2011  . Osteoarthritis of multiple joints 07/03/2011  . Esophageal motility disorder 02/14/2011  . Cough 02/14/2011  . GERD 05/05/2010    Past Medical History:  Diagnosis Date  . Asthma    Albuterol in haler prn  . Cataract    immature unsure which eye  . Chronic back pain   . Degenerative disk disease    psoriatic  .  Diabetes mellitus without complication (HCC)    diet controlled  . Esophageal motility disorder    Non-specific, see modified barium study/speech path, BP  . GERD (gastroesophageal reflux disease)    takes Protonix bid  . History of blood transfusion    post c-section  . History of  bronchitis   . HTN (hypertension)   . Hyperlipidemia    takes Pravastatin daily  . Lymphocytic colitis 05/26/2010   Responded to Entocort x 3 MOS  . Neuropathy   . Osteoporosis    gets Boniva every 3 months  . Peripheral edema    takes HCTZ daily  . PONV (postoperative nausea and vomiting)   . Psoriatic arthritis ([place])    Dr. Katherina Right  . Raynaud's disease   . Seasonal allergies   . SUI (stress urinary incontinence, female)   . Urinary urgency     Family History  Problem Relation Age of Onset  . Diabetes Mother   . Pancreatitis Mother   . Arthritis Mother        psoriatic   . Fibromyalgia Mother   . Rheumatic fever Father   . Diabetes Other        grandparents  . Skin cancer Other        grandfather  . Hypertension Other        grandparent  . Congestive Heart Failure Other        grandfather  . Parkinson's disease Other        grandmother  . Colon cancer Maternal Uncle   . Fibromyalgia Sister   . Rheum arthritis Sister   . Diabetes Sister   . Fibromyalgia Sister   . Diabetes Sister   . Rheum arthritis Sister   . Hypertension Son   . Colon polyps Neg Hx    Past Surgical History:  Procedure Laterality Date  . BIOPSY  04/17/2016   Procedure: BIOPSY;  Surgeon: Danie Binder, MD;  [facility];  Service: Endoscopy;;  random colon bx's  . CERVICAL SPINE SURGERY  09/09/2017  . CESAREAN SECTION  1974, 1978      . COLONOSCOPY  06/19/2008   [id number] internal hemorrhoids/mild sigmoin colon diverticulosis  . COLONOSCOPY  2012   Dr. Gala Romney: normal rectum, diverticula, lymphocytic colitis   . COLONOSCOPY WITH PROPOFOL N/A 04/17/2016   Procedure: COLONOSCOPY WITH PROPOFOL;  Surgeon: Danie Binder, MD;  Location: [facility];  Service: Endoscopy;  Laterality: N/A;  900  . DILATION AND CURETTAGE OF UTERUS  1977   spontaneous abortion  . ESOPHAGOGASTRODUODENOSCOPY  06/19/2008   [id number] gastritis  . LUMBAR DISC ARTHROPLASTY  7/98  . LUMBAR FUSION  6/03,  11/08, 11/14   L4-5 fusion, L2-3 fusion, L1-2  . ODONTOID SCREW INSERTION N/A 01/27/2018   Procedure: ODONTOID SCREW INSERTION;  Surgeon: Newman Pies, MD;  Location: [facility];  Service: Neurosurgery;  Laterality: N/A;  ODONTOID SCREW INSERTION  . TONSILLECTOMY AND ADENOIDECTOMY    . TUBAL LIGATION     Social History   Social History Narrative   ATTENDS [employer]. 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   Immunization History  Administered Date(s) Administered  . Influenza,inj,Quad PF,6+ Mos 01/21/2017  . Influenza-Unspecified 01/30/2017  . Pneumococcal Polysaccharide-23 02/19/2013  . Tdap 09/10/2016     Objective: Vital Signs: BP (!) 147/72 (BP Location: Left Arm, Patient Position: Sitting, Cuff Size: Normal)   Pulse 99   Resp 14   Ht 4\' 11"  (1.499 m)   Wt 142 lb 3.2 oz (64.5  kg)   LMP 02/01/1999   BMI 28.72 kg/m    Physical Exam Vitals and nursing note reviewed.  Constitutional:      Appearance: She is well-developed.  HENT:     Head: Normocephalic and atraumatic.  Eyes:     Conjunctiva/sclera: Conjunctivae normal.  Cardiovascular:     Rate and Rhythm: Normal rate and regular rhythm.     Heart sounds: Normal heart sounds.  Pulmonary:     Effort: Pulmonary effort is normal.     Breath sounds: Normal breath sounds.  Abdominal:     General: Bowel sounds are normal.     Palpations: Abdomen is soft.  Musculoskeletal:     Cervical back: Normal range of motion.  Lymphadenopathy:     Cervical: No cervical adenopathy.  Skin:    General: Skin is warm and dry.     Capillary Refill: Capillary refill takes less than 2 seconds.  Neurological:     Mental Status: She is alert and oriented to person, place, and time.  Psychiatric:        Behavior: Behavior normal.      Musculoskeletal Exam: C-spine, thoracic spine, and lumbar spine good ROM. Left SI joint tenderness.  Right shoulder limited internal rotation with discomfort.  Left shoulder good ROM with no discomfort.   Elbow joints, wrist joints, MCPs, PIPs, and DIPs good ROM with no synovitis.  PIP and DIP synovial thickening consistent with osteoarthritis.  CMC joint synovial thickening bilaterally.  Hip joints, knee joints, ankle joints, MTPs, PIPs, and DIPs good ROM with no synovitis.  No warmth or effusion of knee joints.  No tenderness or swelling of ankle joints.  No achilles tendonitis or plantar fasciitis.    CDAI Exam: CDAI Score: -- Patient Global: --; Provider Global: -- Swollen: --; Tender: -- Joint Exam 04/07/2019   No joint exam has been documented for this visit   There is currently no information documented on the homunculus. Go to the Rheumatology activity and complete the homunculus joint exam.  Investigation: No additional findings.  Imaging: No results found.  Recent Labs: Lab Results  Component Value Date   WBC 14.7 (H) 02/10/2019   HGB 12.6 03/06/2019   PLT 200 02/10/2019   NA 139 03/06/2019   K 3.8 03/06/2019   CL 108 03/06/2019   CO2 20 (L) 02/10/2019   GLUCOSE 179 (H) 03/06/2019   BUN 15 03/06/2019   CREATININE 0.90 03/06/2019   BILITOT 0.3 02/10/2019   ALKPHOS 56 02/10/2019   AST 43 (H) 02/10/2019   ALT 54 (H) 02/10/2019   PROT 5.6 (L) 02/10/2019   ALBUMIN 2.7 (L) 02/10/2019   CALCIUM 8.3 (L) 02/10/2019   GFRAA >60 02/10/2019   QFTBGOLD Negative 05/05/2015   QFTBGOLDPLUS NEGATIVE 09/04/2018    Speciality Comments: No specialty comments available.  Procedures:  Sacroiliac Joint Inj on 04/07/2019 11:40 AM Indications: pain Details: 27 G 1.5 in needle, posterior approach Medications: 1 mL lidocaine 1 %; 40 mg triamcinolone acetonide 40 MG/ML Aspirate: 0 mL Outcome: tolerated well, no immediate complications Procedure, treatment alternatives, risks and benefits explained, specific risks discussed. Consent was given by the patient. Immediately prior to procedure a time out was called to verify the correct patient, procedure, equipment, support staff and  site/side marked as required. Patient was prepped and draped in the usual sterile fashion.     Allergies: Adalimumab, Imuran [azathioprine sodium], Methotrexate, Plaquenil [hydroxychloroquine sulfate], Sulfonamide derivatives, Ceftin [cefuroxime axetil], and Morphine   Assessment / Plan:  Visit Diagnoses: Psoriatic arthritis ([place]): She has no synovitis on exam today.  She has been experiencing increased pain in both hands, left SI joint, and right knee joint for the past 1 month.  She had to hold cosentyx and Arava for about 1 month after being hospitalized for the management of acute respiratory failure with hypoxia due to viral pneumonia due to covid-19.  Her symptoms have resolved and she has resumed both medications.  She continues to take prednisone 5 mg po daily and hydrocodone as needed for pain relief. She presents today with left SI joint tenderness and requested a left SI joint cortisone injection.  She tolerated the procedure well.  She will continue on Cosentyx 150 mg sq injections every 14 days, Arava 20 mg 1 tablet daily, and prednisone 5 mg po daily.  She was advised to notify us if she develops increased joint pain or joint swelling.  She will follow up in 3 months.   Psoriasis: She has no active psoriasis at this time.   High risk medication use - Cosentyx 150 mg every 14 days and Arava 20 mg 1 tablet daily.  She is on long-term prednisone 5 mg by mouth daily.  CBC and CMP were drawn on 02/10/19.  She will be due to update lab work in February and every 3 months.  Standing orders are in place.  TB gold negative on 09/04/18.   Chronic left SI joint pain: She has been experiencing increased left SI joint pain for the past 1 month.  She has been using a heating pad for pain relief. She requested a left SI joint cortisone injection today.  She tolerated the procedure well.  Procedure note completed above.   Acute pain of right shoulder: She has been experiencing mild discomfort in the  right shoulder joint intermittently.  She has limited internal rotation.  She was given a handout of shoulder joint exercises to perform. She was advised to notify us if her joint pain persists or worsens and she can return for x-ray.  Primary osteoarthritis involving multiple joints: She has generalized arthralgias related to osteoarthritis.  She takes hydrocodone as needed for pain relief.   Raynaud's syndrome without gangrene: She has not had any active symptoms of Raynaud's recently.  No digital ulcerations or signs of gangrene.   Morton's neuroma of both feet: She has chronic pain in both feet. She wears proper fitting shoes.   DDD (degenerative disc disease), cervical: She experiences occasional neck pain and stiffness.  She has no symptoms of radiculopathy at this time.   DDD (degenerative disc disease), thoracic: No midline spinal tenderness.   Age-related osteoporosis without current pathological fracture: She is on Reclast IV yearly managed by Dr. Chalmers Cater.   Chronic pain syndrome: She takes hydrocodone as prescribed for pain relief. Future order for UDS placed today.   Medication monitoring encounter: Future order for UDS placed today.    Other medical conditions are listed as follows:   History of gastroesophageal reflux (GERD)  History of type 2 diabetes mellitus  Collagenous colitis  Esophageal motility disorder  Essential hypertension  Orders: Orders Placed This Encounter  Procedures  . Sacroiliac Joint Inj  . UDS   Meds ordered this encounter  Medications  . predniSONE (DELTASONE) 5 MG tablet    Sig: Take 1 tablet (5 mg total) by mouth every morning.    Dispense:  90 tablet    Refill:  0    Face-to-face time spent with patient was 30  minutes. Greater than 50% of time was spent in counseling and coordination of care.  Follow-Up Instructions: Return for Psoriatic arthritis, Osteoarthritis, Fibromyalgia.   Bo Merino, MD   Scribed by-  Hazel Sams,  PA-C  Note - This record has been created using Dragon software.  Chart creation errors have been sought, but may not always  have been located. Such creation errors do not reflect on  the standard of medical care.

## 2019-04-07 ENCOUNTER — Other Ambulatory Visit: Payer: Self-pay

## 2019-04-07 ENCOUNTER — Encounter: Payer: Self-pay | Admitting: Rheumatology

## 2019-04-07 ENCOUNTER — Ambulatory Visit (INDEPENDENT_AMBULATORY_CARE_PROVIDER_SITE_OTHER): Payer: PPO | Admitting: Rheumatology

## 2019-04-07 VITALS — BP 147/72 | HR 99 | Resp 14 | Ht 59.0 in | Wt 142.2 lb

## 2019-04-07 DIAGNOSIS — L409 Psoriasis, unspecified: Secondary | ICD-10-CM

## 2019-04-07 DIAGNOSIS — M5134 Other intervertebral disc degeneration, thoracic region: Secondary | ICD-10-CM | POA: Diagnosis not present

## 2019-04-07 DIAGNOSIS — M8949 Other hypertrophic osteoarthropathy, multiple sites: Secondary | ICD-10-CM | POA: Diagnosis not present

## 2019-04-07 DIAGNOSIS — G5763 Lesion of plantar nerve, bilateral lower limbs: Secondary | ICD-10-CM

## 2019-04-07 DIAGNOSIS — M810 Age-related osteoporosis without current pathological fracture: Secondary | ICD-10-CM

## 2019-04-07 DIAGNOSIS — G894 Chronic pain syndrome: Secondary | ICD-10-CM

## 2019-04-07 DIAGNOSIS — K52831 Collagenous colitis: Secondary | ICD-10-CM

## 2019-04-07 DIAGNOSIS — K224 Dyskinesia of esophagus: Secondary | ICD-10-CM

## 2019-04-07 DIAGNOSIS — M5030 Other cervical disc degeneration, unspecified cervical region: Secondary | ICD-10-CM | POA: Diagnosis not present

## 2019-04-07 DIAGNOSIS — Z8639 Personal history of other endocrine, nutritional and metabolic disease: Secondary | ICD-10-CM

## 2019-04-07 DIAGNOSIS — I7300 Raynaud's syndrome without gangrene: Secondary | ICD-10-CM

## 2019-04-07 DIAGNOSIS — M159 Polyosteoarthritis, unspecified: Secondary | ICD-10-CM

## 2019-04-07 DIAGNOSIS — Z79899 Other long term (current) drug therapy: Secondary | ICD-10-CM

## 2019-04-07 DIAGNOSIS — M533 Sacrococcygeal disorders, not elsewhere classified: Secondary | ICD-10-CM | POA: Diagnosis not present

## 2019-04-07 DIAGNOSIS — L4050 Arthropathic psoriasis, unspecified: Secondary | ICD-10-CM

## 2019-04-07 DIAGNOSIS — Z5181 Encounter for therapeutic drug level monitoring: Secondary | ICD-10-CM

## 2019-04-07 DIAGNOSIS — M25511 Pain in right shoulder: Secondary | ICD-10-CM

## 2019-04-07 DIAGNOSIS — G8929 Other chronic pain: Secondary | ICD-10-CM

## 2019-04-07 DIAGNOSIS — Z8719 Personal history of other diseases of the digestive system: Secondary | ICD-10-CM

## 2019-04-07 DIAGNOSIS — I10 Essential (primary) hypertension: Secondary | ICD-10-CM

## 2019-04-07 MED ORDER — PREDNISONE 5 MG PO TABS: 5.0000 mg | ORAL_TABLET | Freq: Every morning | ORAL | 0 refills | Status: DC

## 2019-04-07 NOTE — Patient Instructions (Addendum)
Standing Labs We placed an order today for your standing lab work.    Please come back and get your standing labs in February and every 3 months   We have open lab daily Monday through Thursday from 8:30-12:30 PM and 1:30-4:30 PM and Friday from 8:30-12:30 PM and 1:30-4:00 PM at the office of Dr. Bo Merino.   You may experience shorter wait times on Monday and Friday afternoons. The office is located at [address], [place], [place], [place] [address] No appointment is necessary.   Labs are drawn by [facility].  You may receive a bill from [facility] for your lab work.  If you wish to have your labs drawn at another location, please call the office 24 hours in advance to send orders.  If you have any questions regarding directions or hours of operation,  please call [phone].   Just as a reminder please drink plenty of water prior to coming for your lab work. Thanks!    Shoulder Exercises Ask your health care provider which exercises are safe for you. Do exercises exactly as told by your health care provider and adjust them as directed. It is normal to feel mild stretching, pulling, tightness, or discomfort as you do these exercises. Stop right away if you feel sudden pain or your pain gets worse. Do not begin these exercises until told by your health care provider. Stretching exercises External rotation and abduction This exercise is sometimes called corner stretch. This exercise rotates your arm outward (external rotation) and moves your arm out from your body (abduction). 1. Stand in a doorway with one of your feet slightly in front of the other. This is called a staggered stance. If you cannot reach your forearms to the door frame, stand facing a corner of a room. 2. Choose one of the following positions as told by your health care provider: ? Place your hands and forearms on the door frame above your head. ? Place your hands and forearms on the door frame at the height of  your head. ? Place your hands on the door frame at the height of your elbows. 3. Slowly move your weight onto your front foot until you feel a stretch across your chest and in the front of your shoulders. Keep your head and chest upright and keep your abdominal muscles tight. 4. Hold for __________ seconds. 5. To release the stretch, shift your weight to your back foot. Repeat __________ times. Complete this exercise __________ times a day. Extension, standing 1. Stand and hold a broomstick, a cane, or a similar object behind your back. ? Your hands should be a little wider than shoulder width apart. ? Your palms should face away from your back. 2. Keeping your elbows straight and your shoulder muscles relaxed, move the stick away from your body until you feel a stretch in your shoulders (extension). ? Avoid shrugging your shoulders while you move the stick. Keep your shoulder blades tucked down toward the middle of your back. 3. Hold for __________ seconds. 4. Slowly return to the starting position. Repeat __________ times. Complete this exercise __________ times a day. Range-of-motion exercises Pendulum  1. Stand near a wall or a surface that you can hold onto for balance. 2. Bend at the waist and let your left / right arm hang straight down. Use your other arm to support you. Keep your back straight and do not lock your knees. 3. Relax your left / right arm and shoulder muscles, and move  your hips and your trunk so your left / right arm swings freely. Your arm should swing because of the motion of your body, not because you are using your arm or shoulder muscles. 4. Keep moving your hips and trunk so your arm swings in the following directions, as told by your health care provider: ? Side to side. ? Forward and backward. ? In clockwise and counterclockwise circles. 5. Continue each motion for __________ seconds, or for as long as told by your health care provider. 6. Slowly return to the  starting position. Repeat __________ times. Complete this exercise __________ times a day. Shoulder flexion, standing  1. Stand and hold a broomstick, a cane, or a similar object. Place your hands a little more than shoulder width apart on the object. Your left / right hand should be palm up, and your other hand should be palm down. 2. Keep your elbow straight and your shoulder muscles relaxed. Push the stick up with your healthy arm to raise your left / right arm in front of your body, and then over your head until you feel a stretch in your shoulder (flexion). ? Avoid shrugging your shoulder while you raise your arm. Keep your shoulder blade tucked down toward the middle of your back. 3. Hold for __________ seconds. 4. Slowly return to the starting position. Repeat __________ times. Complete this exercise __________ times a day. Shoulder abduction, standing 1. Stand and hold a broomstick, a cane, or a similar object. Place your hands a little more than shoulder width apart on the object. Your left / right hand should be palm up, and your other hand should be palm down. 2. Keep your elbow straight and your shoulder muscles relaxed. Push the object across your body toward your left / right side. Raise your left / right arm to the side of your body (abduction) until you feel a stretch in your shoulder. ? Do not raise your arm above shoulder height unless your health care provider tells you to do that. ? If directed, raise your arm over your head. ? Avoid shrugging your shoulder while you raise your arm. Keep your shoulder blade tucked down toward the middle of your back. 3. Hold for __________ seconds. 4. Slowly return to the starting position. Repeat __________ times. Complete this exercise __________ times a day. Internal rotation  1. Place your left / right hand behind your back, palm up. 2. Use your other hand to dangle an exercise band, a towel, or a similar object over your shoulder. Grasp  the band with your left / right hand so you are holding on to both ends. 3. Gently pull up on the band until you feel a stretch in the front of your left / right shoulder. The movement of your arm toward the center of your body is called internal rotation. ? Avoid shrugging your shoulder while you raise your arm. Keep your shoulder blade tucked down toward the middle of your back. 4. Hold for __________ seconds. 5. Release the stretch by letting go of the band and lowering your hands. Repeat __________ times. Complete this exercise __________ times a day. Strengthening exercises External rotation  1. Sit in a stable chair without armrests. 2. Secure an exercise band to a stable object at elbow height on your left / right side. 3. Place a soft object, such as a folded towel or a small pillow, between your left / right upper arm and your body to move your elbow about 4  inches (10 cm) away from your side. 4. Hold the end of the exercise band so it is tight and there is no slack. 5. Keeping your elbow pressed against the soft object, slowly move your forearm out, away from your abdomen (external rotation). Keep your body steady so only your forearm moves. 6. Hold for __________ seconds. 7. Slowly return to the starting position. Repeat __________ times. Complete this exercise __________ times a day. Shoulder abduction  1. Sit in a stable chair without armrests, or stand up. 2. Hold a __________ weight in your left / right hand, or hold an exercise band with both hands. 3. Start with your arms straight down and your left / right palm facing in, toward your body. 4. Slowly lift your left / right hand out to your side (abduction). Do not lift your hand above shoulder height unless your health care provider tells you that this is safe. ? Keep your arms straight. ? Avoid shrugging your shoulder while you do this movement. Keep your shoulder blade tucked down toward the middle of your back. 5. Hold  for __________ seconds. 6. Slowly lower your arm, and return to the starting position. Repeat __________ times. Complete this exercise __________ times a day. Shoulder extension 1. Sit in a stable chair without armrests, or stand up. 2. Secure an exercise band to a stable object in front of you so it is at shoulder height. 3. Hold one end of the exercise band in each hand. Your palms should face each other. 4. Straighten your elbows and lift your hands up to shoulder height. 5. Step back, away from the secured end of the exercise band, until the band is tight and there is no slack. 6. Squeeze your shoulder blades together as you pull your hands down to the sides of your thighs (extension). Stop when your hands are straight down by your sides. Do not let your hands go behind your body. 7. Hold for __________ seconds. 8. Slowly return to the starting position. Repeat __________ times. Complete this exercise __________ times a day. Shoulder row 1. Sit in a stable chair without armrests, or stand up. 2. Secure an exercise band to a stable object in front of you so it is at waist height. 3. Hold one end of the exercise band in each hand. Position your palms so that your thumbs are facing the ceiling (neutral position). 4. Bend each of your elbows to a 90-degree angle (right angle) and keep your upper arms at your sides. 5. Step back until the band is tight and there is no slack. 6. Slowly pull your elbows back behind you. 7. Hold for __________ seconds. 8. Slowly return to the starting position. Repeat __________ times. Complete this exercise __________ times a day. Shoulder press-ups  1. Sit in a stable chair that has armrests. Sit upright, with your feet flat on the floor. 2. Put your hands on the armrests so your elbows are bent and your fingers are pointing forward. Your hands should be about even with the sides of your body. 3. Push down on the armrests and use your arms to lift yourself  off the chair. Straighten your elbows and lift yourself up as much as you comfortably can. ? Move your shoulder blades down, and avoid letting your shoulders move up toward your ears. ? Keep your feet on the ground. As you get stronger, your feet should support less of your body weight as you lift yourself up. 4. Hold for __________  seconds. 5. Slowly lower yourself back into the chair. Repeat __________ times. Complete this exercise __________ times a day. Wall push-ups  1. Stand so you are facing a stable wall. Your feet should be about one arm-length away from the wall. 2. Lean forward and place your palms on the wall at shoulder height. 3. Keep your feet flat on the floor as you bend your elbows and lean forward toward the wall. 4. Hold for __________ seconds. 5. Straighten your elbows to push yourself back to the starting position. Repeat __________ times. Complete this exercise __________ times a day. This information is not intended to replace advice given to you by your health care provider. Make sure you discuss any questions you have with your health care provider. Document Revised: 07/11/2018 Document Reviewed: 04/18/2018 Elsevier Patient Education  [place].

## 2019-04-27 ENCOUNTER — Other Ambulatory Visit: Payer: Self-pay | Admitting: Rheumatology

## 2019-04-27 DIAGNOSIS — Z5181 Encounter for therapeutic drug level monitoring: Secondary | ICD-10-CM

## 2019-04-27 DIAGNOSIS — G8929 Other chronic pain: Secondary | ICD-10-CM

## 2019-04-27 MED ORDER — HYDROCODONE-ACETAMINOPHEN 5-325 MG PO TABS: ORAL_TABLET | ORAL | 0 refills | Status: DC

## 2019-04-27 NOTE — Telephone Encounter (Signed)
Last Visit: 04/07/19 Next Visit: 06/16/19 UDS: 01/14/19 Narc Agreement: 01/14/19   Patient advised she is due update UDS and Narc agreement. Patient will update 04/28/19  Okay to refill Hydrocodone?

## 2019-04-27 NOTE — Addendum Note (Signed)
Addended by: Carole Binning on: 04/27/2019 12:46 PM   Modules accepted: Orders

## 2019-04-27 NOTE — Addendum Note (Signed)
Addended by: Carole Binning on: 04/27/2019 12:43 PM   Modules accepted: Orders

## 2019-04-27 NOTE — Telephone Encounter (Signed)
Patient called requesting prescription refill of Hydrocodone to be sent to [facility] in [place]. 

## 2019-04-27 NOTE — Addendum Note (Signed)
Addended by: Bo Merino on: 04/27/2019 12:53 PM   Modules accepted: Orders

## 2019-04-28 ENCOUNTER — Other Ambulatory Visit: Payer: Self-pay

## 2019-04-28 ENCOUNTER — Ambulatory Visit (INDEPENDENT_AMBULATORY_CARE_PROVIDER_SITE_OTHER): Payer: PPO | Admitting: Obstetrics & Gynecology

## 2019-04-28 ENCOUNTER — Encounter: Payer: Self-pay | Admitting: Obstetrics & Gynecology

## 2019-04-28 VITALS — BP 128/70 | HR 88 | Temp 95.1°F | Resp 10 | Ht <= 58 in | Wt 141.6 lb

## 2019-04-28 DIAGNOSIS — Z01411 Encounter for gynecological examination (general) (routine) with abnormal findings: Secondary | ICD-10-CM | POA: Diagnosis not present

## 2019-04-28 DIAGNOSIS — R3915 Urgency of urination: Secondary | ICD-10-CM | POA: Diagnosis not present

## 2019-04-28 DIAGNOSIS — R0781 Pleurodynia: Secondary | ICD-10-CM | POA: Diagnosis not present

## 2019-04-28 DIAGNOSIS — R0789 Other chest pain: Secondary | ICD-10-CM

## 2019-04-28 DIAGNOSIS — L570 Actinic keratosis: Secondary | ICD-10-CM | POA: Diagnosis not present

## 2019-04-28 DIAGNOSIS — L304 Erythema intertrigo: Secondary | ICD-10-CM | POA: Diagnosis not present

## 2019-04-28 DIAGNOSIS — L723 Sebaceous cyst: Secondary | ICD-10-CM | POA: Diagnosis not present

## 2019-04-28 MED ORDER — OXYBUTYNIN CHLORIDE ER 5 MG PO TB24: 5.0000 mg | ORAL_TABLET | Freq: Every day | ORAL | 1 refills | Status: DC

## 2019-04-28 NOTE — Patient Instructions (Addendum)
Oxytrol over the counter patches.  You change the patch twice weekly.  [place] location for [facility].  I placed the order for the chest x ray today.

## 2019-04-28 NOTE — Addendum Note (Signed)
Addended by: Megan Salon on: 04/28/2019 01:54 PM   Modules accepted: Orders

## 2019-04-28 NOTE — Progress Notes (Signed)
[name] Married White or Caucasian female here for annual exam.  She did have Covid in October.  Was hospitalized for covid pneumonia.  Is planning in getting the vaccine as well.  Saw Dr. Estanislado Pandy in early January.  Had an injection the same day.  Denies vaginal bleeding.    Tonya Hall was here for almost four weeks around the holidays.  Having a very specific chest wall pain that has been present several week, possibly two months.  Denies trauma.    Patient's last menstrual period was 02/01/1999.          Sexually active: No.  The current method of family planning is post menopausal status.    Exercising: No.  The patient does not participate in regular exercise at present. Smoker:  no  Health Maintenance: Pap:   01/02/18 Neg  09/10/16 Neg              08/12/15 Neg  History of abnormal Pap:  no MMG:  09/29/18 BIRADS 1 negative/density c Colonoscopy:  04/17/16 Normal. F/u 10 years  BMD:   October 2021 - Osteopenia -- stable per patient (did this with Dr. Chalmers Cater) TDaP:  2018 Pneumonia vaccine(s):  2014 Shingrix:   no Hep C testing: never Screening Labs: PCP   reports that she has never smoked. She has never used smokeless tobacco. She reports that she does not drink alcohol or use drugs.  Past Medical History:  Diagnosis Date  . Asthma    Albuterol in haler prn  . Cataract    immature unsure which eye  . Chronic back pain   . COVID-19 October/November 2020  . Degenerative disk disease    psoriatic  . Diabetes mellitus without complication (HCC)    diet controlled  . Esophageal motility disorder    Non-specific, see modified barium study/speech path, BP  . GERD (gastroesophageal reflux disease)    takes Protonix bid  . History of blood transfusion    post c-section  . History of bronchitis   . HTN (hypertension)   . Hyperlipidemia    takes Pravastatin daily  . Lymphocytic colitis 05/26/2010   Responded to Entocort x 3 MOS  . Neuropathy   . Osteoporosis    gets Boniva every 3 months  . Peripheral edema    takes HCTZ daily  . PONV (postoperative nausea and vomiting)   . Psoriatic arthritis ([facility])    Dr. Katherina Right  . Raynaud's disease   . Seasonal allergies   . Shingles 2020  . SUI (stress urinary incontinence, female)   . Urinary urgency     Past Surgical History:  Procedure Laterality Date  . BIOPSY  04/17/2016   Procedure: BIOPSY;  Surgeon: Danie Binder, MD;  [facility];  Service: Endoscopy;;  random colon bx's  . CERVICAL SPINE SURGERY  09/09/2017  . CESAREAN SECTION  1974, 1978      . COLONOSCOPY  06/19/2008   [id number] internal hemorrhoids/mild sigmoin colon diverticulosis  . COLONOSCOPY  2012   Dr. Gala Romney: normal rectum, diverticula, lymphocytic colitis   . COLONOSCOPY WITH PROPOFOL N/A 04/17/2016   Procedure: COLONOSCOPY WITH PROPOFOL;  Surgeon: Danie Binder, MD;  Location: [facility];  Service: Endoscopy;  Laterality: N/A;  900  . DILATION AND CURETTAGE OF UTERUS  1977   spontaneous abortion  . ESOPHAGOGASTRODUODENOSCOPY  06/19/2008   [id number] gastritis  . LUMBAR DISC ARTHROPLASTY  7/98  . LUMBAR FUSION  6/03, 11/08, 11/14   L4-5 fusion, L2-3  fusion, L1-2  . ODONTOID SCREW INSERTION N/A 01/27/2018   Procedure: ODONTOID SCREW INSERTION;  Surgeon: Newman Pies, MD;  Location: [facility];  Service: Neurosurgery;  Laterality: N/A;  ODONTOID SCREW INSERTION  . TONSILLECTOMY AND ADENOIDECTOMY    . TUBAL LIGATION      Current Outpatient Medications  Medication Sig Dispense Refill  . albuterol (PROVENTIL HFA;VENTOLIN HFA) 108 (90 BASE) MCG/ACT inhaler Inhale 2 puffs into the lungs every 6 (six) hours as needed for wheezing or shortness of breath.     Marland Kitchen aspirin 81 MG tablet Take 81 mg by mouth daily.      . Calcium Carb-Cholecalciferol (CALCIUM 600 + D PO) Take 1 tablet by mouth 2 (two) times daily.    . cetirizine (ZYRTEC) 10 MG tablet Take 10 mg by mouth daily as needed for allergies.    Marland Kitchen diclofenac  sodium (VOLTAREN) 1 % GEL Apply 2 g topically 3 (three) times daily as needed (pain).   2  . Evolocumab (REPATHA [place]) Inject into the skin.    . fish oil-omega-3 fatty acids 1000 MG capsule Take 2 g by mouth daily.     . fluorouracil (EFUDEX) 5 % cream     . gabapentin (NEURONTIN) 300 MG capsule Take 300 mg by mouth 4 (four) times daily.     . Ginger, Zingiber officinalis, (GINGER ROOT) 500 MG CAPS Take 1 capsule by mouth daily.     . Glucosamine Sulfate 1000 MG CAPS Take 2 capsules (2,000 mg total) by mouth daily.    Marland Kitchen guaiFENesin-dextromethorphan (ROBITUSSIN DM) 100-10 MG/5ML syrup Take 10 mLs by mouth every 6 (six) hours as needed for cough.    Marland Kitchen HUMULIN N KWIKPEN 100 UNIT/ML Kiwkpen Inject 10 Units into the skin at bedtime.    Marland Kitchen HYDROcodone-acetaminophen (NORCO/VICODIN) 5-325 MG tablet Take 1 tablet by mouth 2-3 times daily as needed for pain 90 tablet 0  . hydrocortisone sodium succinate (SOLU-CORTEF) 100 MG SOLR injection Inject 100 mg into the muscle See admin instructions. Take as needed when unable to swallow prednisone tablets     . ketoconazole (NIZORAL) 2 % cream     . leflunomide (ARAVA) 20 MG tablet TAKE 1 TABLET BY MOUTH ONCE DAILY. 90 tablet 0  . lidocaine (XYLOCAINE) 2 % solution 2 TSP  PO qac/hs PRN FOR upper ABDOMINAL OR CHEST PAIN. MAY REPEAT DOSE EVERY 4 HOURS. NO MORE THAN 8 DOSES A DAY. 300 mL 1  . losartan (COZAAR) 100 MG tablet Take 100 mg by mouth daily.    . metoprolol succinate (TOPROL-XL) 50 MG 24 hr tablet Take 1 tablet (50 mg total) by mouth daily. Take with or immediately following a meal.    . Misc Natural Products (TART CHERRY ADVANCED PO) Take 1 tablet by mouth daily. daily     . Multiple Vitamin (MULTIVITAMIN) tablet Take 1 tablet by mouth daily.      . nortriptyline (PAMELOR) 25 MG capsule TAKE (1) CAPSULE BY MOUTH AT BEDTIME. (Patient taking differently: Take 25 mg by mouth at bedtime. ) 30 capsule 0  . omeprazole (PRILOSEC) 20 MG capsule TAKE ONE CAPSULE BY  MOUTH TWICE DAILY BEFORE MEALS. 180 capsule 3  . ONETOUCH VERIO test strip     . potassium chloride SA (K-DUR,KLOR-CON) 20 MEQ tablet Take 20 mEq by mouth daily.     . predniSONE (DELTASONE) 5 MG tablet Take 1 tablet (5 mg total) by mouth every morning. 90 tablet 0  . RESTASIS MULTIDOSE 0.05 % ophthalmic emulsion  Place 1 drop into both eyes 2 (two) times daily.     . Secukinumab (COSENTYX SENSOREADY PEN) 150 MG/ML SOAJ Inject 150 mg into the skin every 14 (fourteen) days. 2 pen 2  . spironolactone (ALDACTONE) 25 MG tablet Take 25 mg by mouth daily.     . SURE COMFORT INSULIN SYRINGE 31G X 5/16" 0.3 ML MISC     . SURE COMFORT PEN NEEDLES 31G X 5 MM MISC     . topiramate (TOPAMAX) 200 MG tablet TAKE (1) TABLET BY MOUTH AT BEDTIME. 90 tablet 0  . Turmeric 500 MG CAPS Take 1 capsule by mouth daily.     . Zoledronic Acid (RECLAST IV) Inject into the vein.      No current facility-administered medications for this visit.    Family History  Problem Relation Age of Onset  . Diabetes Mother   . Pancreatitis Mother   . Arthritis Mother        psoriatic   . Fibromyalgia Mother   . Rheumatic fever Father   . Diabetes Other        grandparents  . Skin cancer Other        grandfather  . Hypertension Other        grandparent  . Congestive Heart Failure Other        grandfather  . Parkinson's disease Other        grandmother  . Colon cancer Maternal Uncle   . Fibromyalgia Sister   . Rheum arthritis Sister   . Diabetes Sister   . Fibromyalgia Sister   . Diabetes Sister   . Rheum arthritis Sister   . Hypertension Son   . Colon polyps Neg Hx     Review of Systems  All other systems reviewed and are negative.   Exam:   BP 128/70 (BP Location: Left Arm, Patient Position: Sitting, Cuff Size: Normal)   Pulse 88   Temp (!) 95.1 F (35.1 C) (Temporal)   Resp 10   Ht 4\' 10"  (1.473 m)   Wt 141 lb 9.6 oz (64.2 kg)   LMP 02/01/1999   BMI 29.59 kg/m    Height: 4\' 10"  (147.3 cm)  Ht  Readings from Last 3 Encounters:  04/28/19 4\' 10"  (1.473 m)  04/07/19 4\' 11"  (1.499 m)  03/06/19 4\' 11"  (1.499 m)    General appearance: alert, cooperative and appears stated age Head: Normocephalic, without obvious abnormality, atraumatic Neck: no adenopathy, supple, symmetrical, trachea midline and thyroid normal to inspection and palpation Lungs: clear to auscultation bilaterally Breasts: normal appearance, no masses or tenderness Heart: regular rate and rhythm Chest wall:  Tenderness to palpation at or above the xyphoid process Abdomen: soft, non-tender; bowel sounds normal; no masses,  no organomegaly Extremities: extremities normal, atraumatic, no cyanosis or edema Skin: Skin color, texture, turgor normal. No rashes or lesions Lymph nodes: Cervical, supraclavicular, and axillary nodes normal. No abnormal inguinal nodes palpated Neurologic: Grossly normal   Pelvic: External genitalia:  no lesions              Urethra:  normal appearing urethra with no masses, tenderness or lesions              Bartholins and Skenes: normal                 Vagina: normal appearing vagina with normal color and discharge, no lesions              Cervix: no lesions  Pap taken: No. Bimanual Exam:  Uterus:  normal size, contour, position, consistency, mobility, non-tender              Adnexa: normal adnexa and no mass, fullness, tenderness               Rectovaginal: Confirms               Anus:  normal sphincter tone, no lesions  Chaperone, Terence Lux, CMA, was present for exam.  A:  Well Woman with normal exam PMP, no HRT Psoriatic arthritis On chronic immunosuppression H/O elevated lipids and elevated triglycerides Diabetes, followed by Dr. Chalmers Cater H/o microscopic colitis Osteopenia, on Reclast.  Followed by Dr. Chalmers Cater. Urinary urgency   P:   Mammogram guidelines reviewed.  Doing yearly MMGs. pap smear not indicated.  Current up to date recommendations are for every three  year pap smear continued through lifetime Shringrix vaccination discussion.  She is planning on getting this.   Urine culture pending.  Cannot use myrbetriq due to interaction with nortriptyline.  Oxytrol patches discussed.   Return annually or prn

## 2019-04-30 ENCOUNTER — Telehealth: Payer: Self-pay | Admitting: *Deleted

## 2019-04-30 ENCOUNTER — Ambulatory Visit
Admission: RE | Admit: 2019-04-30 | Discharge: 2019-04-30 | Disposition: A | Discharge status: Home / Self Care | Payer: PPO | Source: Ambulatory Visit | Attending: Obstetrics & Gynecology | Admitting: Obstetrics & Gynecology

## 2019-04-30 ENCOUNTER — Other Ambulatory Visit: Payer: Self-pay | Admitting: Pharmacist

## 2019-04-30 DIAGNOSIS — Z5181 Encounter for therapeutic drug level monitoring: Secondary | ICD-10-CM

## 2019-04-30 DIAGNOSIS — G8929 Other chronic pain: Secondary | ICD-10-CM

## 2019-04-30 DIAGNOSIS — G894 Chronic pain syndrome: Secondary | ICD-10-CM

## 2019-04-30 DIAGNOSIS — R0781 Pleurodynia: Secondary | ICD-10-CM | POA: Diagnosis not present

## 2019-04-30 DIAGNOSIS — Z79899 Other long term (current) drug therapy: Secondary | ICD-10-CM

## 2019-04-30 LAB — URINE CULTURE

## 2019-04-30 MED ORDER — OXYTROL 3.9 MG/24HR TD PTTW: 1.0000 | MEDICATED_PATCH | TRANSDERMAL | 12 refills | Status: DC

## 2019-04-30 NOTE — Progress Notes (Signed)
Patient her for UDS.  She is due for CBC/CMP next week so put orders in to draw today.  Last TB gold June 2020.

## 2019-04-30 NOTE — Telephone Encounter (Signed)
Attempted to contact the patient and left message for patient to call the office. Patient was in office today for labs and advised Olivia Mackie she needed refill. Called to verify which medication she needs refilled.

## 2019-04-30 NOTE — Addendum Note (Signed)
Addended by: Megan Salon on: 04/30/2019 06:10 AM   Modules accepted: Orders

## 2019-04-30 NOTE — Telephone Encounter (Signed)
Patient returned the call and states she does not need any refills at this time. Patient states the only refill she needed was hydrocodone and it was refilled on 04/27/2019.

## 2019-05-02 LAB — PAIN MGMT, PROFILE 5 W/CONF, U
Amphetamines: NEGATIVE ng/mL
Barbiturates: NEGATIVE ng/mL
Benzodiazepines: NEGATIVE ng/mL
Cocaine Metabolite: NEGATIVE ng/mL
Codeine: NEGATIVE ng/mL
Creatinine: 52.9 mg/dL
Hydrocodone: 574 ng/mL
Hydromorphone: NEGATIVE ng/mL
Marijuana Metabolite: NEGATIVE ng/mL
Methadone Metabolite: NEGATIVE ng/mL
Morphine: NEGATIVE ng/mL
Norhydrocodone: 697 ng/mL
Opiates: POSITIVE ng/mL
Oxidant: NEGATIVE ug/mL
Oxycodone: NEGATIVE ng/mL
pH: 5.4 (ref 4.5–9.0)

## 2019-05-02 LAB — COMPLETE METABOLIC PANEL WITH GFR
AG Ratio: 1.9 (calc) (ref 1.0–2.5)
ALT: 26 U/L (ref 6–29)
AST: 21 U/L (ref 10–35)
Albumin: 4.2 g/dL (ref 3.6–5.1)
Alkaline phosphatase (APISO): 49 U/L (ref 37–153)
BUN/Creatinine Ratio: 32 (calc) — ABNORMAL HIGH (ref 6–22)
BUN: 35 mg/dL — ABNORMAL HIGH (ref 7–25)
CO2: 22 mmol/L (ref 20–32)
Calcium: 9.6 mg/dL (ref 8.6–10.4)
Chloride: 107 mmol/L (ref 98–110)
Creat: 1.09 mg/dL — ABNORMAL HIGH (ref 0.50–0.99)
GFR, Est African American: 60 mL/min/{1.73_m2} (ref >=60)
GFR, Est Non African American: 52 mL/min/{1.73_m2} — ABNORMAL LOW (ref >=60)
Globulin: 2.2 g/dL (calc) (ref 1.9–3.7)
Glucose, Bld: 185 mg/dL — ABNORMAL HIGH (ref 65–99)
Potassium: 4.8 mmol/L (ref 3.5–5.3)
Sodium: 138 mmol/L (ref 135–146)
Total Bilirubin: 0.3 mg/dL (ref 0.2–1.2)
Total Protein: 6.4 g/dL (ref 6.1–8.1)

## 2019-05-02 LAB — CBC WITH DIFFERENTIAL/PLATELET
Absolute Monocytes: 594 cells/uL (ref 200–950)
Basophils Absolute: 106 cells/uL (ref 0–200)
Basophils Relative: 0.8 %
Eosinophils Absolute: 277 cells/uL (ref 15–500)
Eosinophils Relative: 2.1 %
HCT: 41.1 % (ref 35.0–45.0)
Hemoglobin: 13.3 g/dL (ref 11.7–15.5)
Lymphs Abs: 1558 cells/uL (ref 850–3900)
MCH: 31.3 pg (ref 27.0–33.0)
MCHC: 32.4 g/dL (ref 32.0–36.0)
MCV: 96.7 fL (ref 80.0–100.0)
MPV: 12.2 fL (ref 7.5–12.5)
Monocytes Relative: 4.5 %
Neutro Abs: 10666 cells/uL — ABNORMAL HIGH (ref 1500–7800)
Neutrophils Relative %: 80.8 %
Platelets: 244 10*3/uL (ref 140–400)
RBC: 4.25 10*6/uL (ref 3.80–5.10)
RDW: 13 % (ref 11.0–15.0)
Total Lymphocyte: 11.8 %
WBC: 13.2 10*3/uL — ABNORMAL HIGH (ref 3.8–10.8)

## 2019-05-07 DIAGNOSIS — R079 Chest pain, unspecified: Secondary | ICD-10-CM | POA: Diagnosis not present

## 2019-05-07 DIAGNOSIS — S2243XD Multiple fractures of ribs, bilateral, subsequent encounter for fracture with routine healing: Secondary | ICD-10-CM | POA: Diagnosis not present

## 2019-05-25 ENCOUNTER — Other Ambulatory Visit: Payer: Self-pay | Admitting: Rheumatology

## 2019-05-25 NOTE — Telephone Encounter (Signed)
Last Visit: 04/07/19 Next Visit: 06/16/19  Okay to refill per Dr. Estanislado Pandy

## 2019-05-26 ENCOUNTER — Telehealth: Payer: Self-pay | Admitting: Rheumatology

## 2019-05-26 NOTE — Telephone Encounter (Signed)
Patient called requesting prescription refill of Cosentyx to be sent to [facility] by [facility]. °

## 2019-05-27 MED ORDER — COSENTYX SENSOREADY PEN 150 MG/ML ~~LOC~~ SOAJ: 150.0000 mg | SUBCUTANEOUS | 2 refills | Status: DC

## 2019-05-27 NOTE — Telephone Encounter (Signed)
Last Visit:04/07/19 Next Visit:06/16/19 Labs: 04/30/19 CBC stable. Glucose is elevated-185. LFTs have returned to WNL. Creatinine is mildly elevated. GFR is 52. TB Gold: 09/04/18 Neg   Okay to refill per Dr. Estanislado Pandy

## 2019-06-02 ENCOUNTER — Other Ambulatory Visit: Payer: Self-pay | Admitting: Rheumatology

## 2019-06-02 ENCOUNTER — Telehealth: Payer: Self-pay | Admitting: Rheumatology

## 2019-06-02 NOTE — Telephone Encounter (Signed)
Patient advised she does not need to hold her Cosentyx or her [place].

## 2019-06-02 NOTE — Telephone Encounter (Signed)
Last Visit:04/07/19 Next Visit:06/16/19 Labs: 04/30/19 CBC stable. Glucose is elevated-185. LFTs have returned to WNL. Creatinine is mildly elevated. GFR is 52.   Okay to refill per Dr. Estanislado Pandy

## 2019-06-02 NOTE — Telephone Encounter (Signed)
Patient called stating she is scheduled for her COVID vaccine tomorrow 06/03/19 and is checking if she needs to hold off on taking her Laura and Cosentyx.  Patient states she has a friend who takes Methotrexate and was told to discontinue taking the medication 1 week prior to her vaccine so she will have a better immune response.  Patient is requesting a return call.

## 2019-06-08 ENCOUNTER — Telehealth: Payer: Self-pay | Admitting: Pharmacist

## 2019-06-08 NOTE — Progress Notes (Signed)
Office Visit Note  Patient: Tonya Hall             Date of Birth: [birth year]           MRN: [id number]             PCP: [name], MD Referring: [name], MD Visit Date: 06/16/2019 Occupation: @GUAROCC @  Subjective:  Pain in multiple joints   History of Present Illness: Tonya Hall is a 69 y.o. female with history of psoriatic arthritis, osteoarthritis, and osteoporosis.  She is on Cosentyx 150 mg subcutaneous injections every 14 days, Arava 20 mg 1 tablet by mouth daily, prednisone 5 mg daily.  She continues take hydrocodone twice daily as needed for pain relief.  She has been experiencing increased pain in multiple joints for the past 2 to 3 months.  She states she has noticed increased pain and swelling in both hands.  She has had constant pain in the left first DIP joint.  She is also been having discomfort in the left SI joint.  She had a cortisone injection on 04/07/2019 which provided temporary relief.  She is also having left trochanteric bursitis.  She has been experiencing frequent right knee buckling and intermittent discomfort.  She denies any right knee joint swelling.  She continues to have persistent right shoulder pain which started over 1 year ago.  She is having difficulty with range of motion due to the discomfort.  She also experiences pain when lying on her right side at night.  She states that she has noticed plantar fasciitis intermittently in the left foot but denies any Achilles tendinitis at this time. She denies any psoriasis at this time.   Activities of Daily Living:  Patient reports joint stiffness all day  Patient Denies nocturnal pain.  Difficulty dressing/grooming: Reports Difficulty climbing stairs: Reports Difficulty getting out of chair: Reports Difficulty using hands for taps, buttons, cutlery, and/or writing: Reports  Review of Systems  Constitutional: Positive for fatigue.  HENT: Positive for mouth dryness and nose dryness. Negative for  mouth sores.   Eyes: Positive for dryness.  Respiratory: Negative for shortness of breath and difficulty breathing.   Cardiovascular: Negative for chest pain and palpitations.  Gastrointestinal: Negative for blood in stool, constipation and diarrhea.  Endocrine: Negative for increased urination.  Genitourinary: Negative for difficulty urinating and painful urination.  Musculoskeletal: Positive for arthralgias, joint pain, joint swelling and morning stiffness.  Skin: Negative for rash and hair loss.  Allergic/Immunologic: Negative for susceptible to infections.  Neurological: Positive for numbness and headaches. Negative for dizziness, memory loss and weakness.  Hematological: Positive for bruising/bleeding tendency.  Psychiatric/Behavioral: Negative for confusion.    PMFS History:  Patient Active Problem List   Diagnosis Date Noted  . AKI (acute kidney injury) ([place]) 02/07/2019  . Acute respiratory failure with hypoxia ([place]) 02/06/2019  . Acute respiratory disease due to COVID-19 virus 02/06/2019  . Community acquired pneumonia 02/06/2019  . Hyponatremia 02/06/2019  . Asthma   . Diabetes mellitus without complication ([place])   . Closed nondisplaced odontoid fracture with type II morphology ([place]) 01/27/2018  . Odontoid fracture ([place]) 01/24/2018  . Chest pain 11/14/2017  . Transaminitis 06/22/2016  . Collagenous colitis 05/23/2016  . Psoriasis 05/21/2016  . High risk medication use 02/15/2016  . Age-related osteoporosis without current pathological fracture 02/15/2016  . Trochanteric bursitis, left hip 02/15/2016  . Sacroiliitis, not elsewhere classified ([place]) 02/15/2016  . Chronic pain syndrome 02/15/2016  . Abnormal weight gain  06/25/2012  . Hypertension 07/03/2011  . Hypercholesteremia 07/03/2011  . Psoriatic arthritis ([place]) 07/03/2011  . Osteoarthritis of multiple joints 07/03/2011  . Esophageal motility disorder 02/14/2011  . Cough 02/14/2011  . GERD 05/05/2010    Past  Medical History:  Diagnosis Date  . Asthma    Albuterol in haler prn  . Cataract    immature unsure which eye  . Chronic back pain   . COVID-19 October/November 2020  . Degenerative disk disease    psoriatic  . Diabetes mellitus without complication (HCC)    diet controlled  . Esophageal motility disorder    Non-specific, see modified barium study/speech path, BP  . GERD (gastroesophageal reflux disease)    takes Protonix bid  . History of blood transfusion    post c-section  . History of bronchitis   . HTN (hypertension)   . Hyperlipidemia    takes Pravastatin daily  . Lymphocytic colitis 05/26/2010   Responded to Entocort x 3 MOS  . Neuropathy   . Osteoporosis    gets Boniva every 3 months  . Peripheral edema    takes HCTZ daily  . PONV (postoperative nausea and vomiting)   . Psoriatic arthritis ([place])    Dr. Katherina Right  . Raynaud's disease   . Seasonal allergies   . Shingles 2020  . SUI (stress urinary incontinence, female)   . Urinary urgency     Family History  Problem Relation Age of Onset  . Diabetes Mother   . Pancreatitis Mother   . Arthritis Mother        psoriatic   . Fibromyalgia Mother   . Rheumatic fever Father   . Diabetes Other        grandparents  . Skin cancer Other        grandfather  . Hypertension Other        grandparent  . Congestive Heart Failure Other        grandfather  . Parkinson's disease Other        grandmother  . Colon cancer Maternal Uncle   . Fibromyalgia Sister   . Rheum arthritis Sister   . Diabetes Sister   . Fibromyalgia Sister   . Diabetes Sister   . Rheum arthritis Sister   . Hypertension Son   . Colon polyps Neg Hx    Past Surgical History:  Procedure Laterality Date  . BIOPSY  04/17/2016   Procedure: BIOPSY;  Surgeon: Danie Binder, MD;  [facility];  Service: Endoscopy;;  random colon bx's  . CERVICAL SPINE SURGERY  09/09/2017  . CESAREAN SECTION  1974, 1978      . COLONOSCOPY  06/19/2008    [id number] internal hemorrhoids/mild sigmoin colon diverticulosis  . COLONOSCOPY  2012   Dr. Gala Romney: normal rectum, diverticula, lymphocytic colitis   . COLONOSCOPY WITH PROPOFOL N/A 04/17/2016   Procedure: COLONOSCOPY WITH PROPOFOL;  Surgeon: Danie Binder, MD;  [facility];  Service: Endoscopy;  Laterality: N/A;  900  . DILATION AND CURETTAGE OF UTERUS  1977   spontaneous abortion  . ESOPHAGOGASTRODUODENOSCOPY  06/19/2008   [id number] gastritis  . LUMBAR DISC ARTHROPLASTY  7/98  . LUMBAR FUSION  6/03, 11/08, 11/14   L4-5 fusion, L2-3 fusion, L1-2  . ODONTOID SCREW INSERTION N/A 01/27/2018   Procedure: ODONTOID SCREW INSERTION;  Surgeon: Newman Pies, MD;  Location: [facility];  Service: Neurosurgery;  Laterality: N/A;  ODONTOID SCREW INSERTION  . TONSILLECTOMY AND ADENOIDECTOMY    . TUBAL  LIGATION     Social History   Social History Narrative   ATTENDS [employer]. 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   Immunization History  Administered Date(s) Administered  . Influenza,inj,Quad PF,6+ Mos 01/21/2017  . Influenza-Unspecified 01/30/2017  . Pneumococcal Polysaccharide-23 02/19/2013  . Tdap 09/10/2016     Objective: Vital Signs: BP 134/83 (BP Location: Right Arm, Patient Position: Sitting, Cuff Size: Normal)   Pulse 89   Resp 14   Ht 4' 10.5" (1.486 m)   Wt 143 lb 3.2 oz (65 kg)   LMP 02/01/1999   BMI 29.42 kg/m    Physical Exam Vitals and nursing note reviewed.  Constitutional:      Appearance: She is well-developed.  HENT:     Head: Normocephalic and atraumatic.  Eyes:     Conjunctiva/sclera: Conjunctivae normal.  Pulmonary:     Effort: Pulmonary effort is normal.  Abdominal:     General: Bowel sounds are normal.     Palpations: Abdomen is soft.  Musculoskeletal:     Cervical back: Normal range of motion.  Lymphadenopathy:     Cervical: No cervical adenopathy.  Skin:    General: Skin is warm and dry.     Capillary Refill: Capillary refill takes  less than 2 seconds.  Neurological:     Mental Status: She is alert and oriented to person, place, and time.  Psychiatric:        Behavior: Behavior normal.      Musculoskeletal Exam: C-spine limited range of motion.  She has trapezius muscle tension and tenderness bilaterally.  Thoracic kyphosis noted.  No midline spinal tenderness noted.  She has left SI joint tenderness on exam.  Right shoulder has full abduction but painful limited internal rotation.  Left shoulder is full range of motion with no discomfort at this time.  Elbow joints have good range of motion with no tenderness or inflammation.  Wrist joints good ROM with no tenderness or synovitis.  [place] joint prominence bilaterally.  PIP and DIP thickening bilaterally.  Synovitis of the 1st MCP and 3rd PIP joint.  She has synovitis of the left 1st DIP joint. Hip joints good ROM.  Tenderness over the left trochanteric bursa.  Knee joints good ROM with no discomfort.  No warmth or effusion of knee joints.  Ankle joints good ROM with no tenderness or inflammation.  No tenderness of MTP joints.  Tenderness on the plantar aspect of the left foot.   CDAI Exam: CDAI Score: 5.2  Patient Global: 6 mm; Provider Global: 6 mm Swollen: 3 ; Tender: 5  Joint Exam 06/16/2019      Right  Left  Acromioclavicular   Tender     MCP 1  Swollen Tender     PIP 3  Swollen Tender     DIP 2     Swollen Tender  Sacroiliac      Tender     Investigation: No additional findings.  Imaging: XR KNEE 3 VIEW RIGHT  Result Date: 06/16/2019 Moderate medial compartment narrowing was noted.  Mild patellofemoral narrowing was noted.  No chondrocalcinosis was noted. Impression: These findings are consistent with moderate osteoarthritis of the knee joint and mild chondromalacia patella.  XR Shoulder Right  Result Date: 06/16/2019 No glenohumeral joint space narrowing was noted.  Some spurring was noted at acromioclavicular joint. Impression: These findings are  consistent with acromioclavicular arthritis of the shoulder joint.   Recent Labs: Lab Results  Component Value Date   WBC 13.2 (H) 04/30/2019  HGB 13.3 04/30/2019   PLT 244 04/30/2019   NA 138 04/30/2019   K 4.8 04/30/2019   CL 107 04/30/2019   CO2 22 04/30/2019   GLUCOSE 185 (H) 04/30/2019   BUN 35 (H) 04/30/2019   CREATININE 1.09 (H) 04/30/2019   BILITOT 0.3 04/30/2019   ALKPHOS 56 02/10/2019   AST 21 04/30/2019   ALT 26 04/30/2019   PROT 6.4 04/30/2019   ALBUMIN 2.7 (L) 02/10/2019   CALCIUM 9.6 04/30/2019   GFRAA 60 04/30/2019   QFTBGOLD Negative 05/05/2015   QFTBGOLDPLUS NEGATIVE 09/04/2018    Speciality Comments: No specialty comments available.  Procedures:  Large Joint Inj: R glenohumeral on 06/16/2019 12:23 PM Indications: pain Details: 27 G 1.5 in needle, posterior approach  Arthrogram: No  Medications: 1.5 mL lidocaine 1 %; 40 mg triamcinolone acetonide 40 MG/ML Aspirate: 0 mL Outcome: tolerated well, no immediate complications Procedure, treatment alternatives, risks and benefits explained, specific risks discussed. Consent was given by the patient. Immediately prior to procedure a time out was called to verify the correct patient, procedure, equipment, support staff and site/side marked as required. Patient was prepped and draped in the usual sterile fashion.     Allergies: Adalimumab, Imuran [azathioprine sodium], Methotrexate, Plaquenil [hydroxychloroquine sulfate], Sulfonamide derivatives, Ceftin [cefuroxime axetil], and Morphine   Assessment / Plan:     Visit Diagnoses: Psoriatic arthritis ([place]): She has tenderness and synovitis of the right first MCP and third PIP joint on exam.  She has chronic thickening of all PIP and DIP joints in both hands.  She has been having constant tenderness in the left first DIP joint.  She has left SI joint tenderness on exam today.  She has been experiencing intermittent plantar fasciitis of the left foot recently.  Over  the past 2 to 3 months she has been experiencing increased arthralgias and joint stiffness, which has not been controlled with hydrocodone and prednisone 5 mg daily. She presents today with ongoing right shoulder pain restarted over 1 year ago.  X-rays of the right shoulder revealed AC joint arthritis.  She request  She tolerated the procedure well.  The procedure note was completed above.  She has also been experiencing buckling of the right knee joint.  X-rays of the right knee revealed moderate osteoarthritis.  We discussed applying for Visco gel injections which she has had in the past.  She will continue on Cosentyx 150 mg subcutaneous injections every 14 days, Arava 20 mg 1 tablet daily, and prednisone 5 mg by mouth daily.  If she continues to have recurrent flares we will discuss switching her from Cosentyx to [place] or adding on [place] to her current regimen.  We will see if [name] will be covered through her insurance.  She does not want to make any medication changes at this time.  She was advised to notify us if she develops increased joint pain or joint swelling.  She will follow-up in the office in 2 months.   Psoriasis: She does not have any psoriasis.   High risk medication use - Cosentyx 150 mg every 14 days and Arava 20 mg 1 tablet daily.  She is on long-term prednisone 5 mg by mouth daily. CBC and CMP were drawn on 04/30/19.  She is due to update lab work in April and every 3 months.  Standing orders are in place. Future order for TB gold placed today.   - Plan: QuantiFERON-TB Gold Plus  Chronic left SI joint pain: She has left SI  joint tenderness on exam today.  She had a cortisone injection in the left SI joint on 04/07/2019 which provided temporary relief.  Primary osteoarthritis involving multiple joints: She has been experiencing increased arthralgias and joint stiffness for the past 2 to 3 months.  She takes hydrocodone twice daily as needed for pain relief.  She is on long-term  prednisone 5 mg daily.  Chronic right shoulder pain - She presents today with ongoing right shoulder joint pain which started over 1 year ago.  She has painful and limited internal rotation of the right shoulder joint. She has tenderness over the right AC joint. X-rays of the right shoulder were obtained today, which revealed AC joint arthritis.  A right glenohumeral joint cortisone injection was performed today.  She tolerated the procedure well. Procedure note completed above. Plan: XR Shoulder Right  Chronic pain of right knee - She presents today with increased pain in the right knee joint.  She has been experiencing frequent buckling in the right knee.  She has no catching or locking sensation at this time.  She has not had any recent injuries or falls.  She has good range of motion of the right knee joint on exam.  No warmth or effusion was noted today.  X-rays of the right knee were obtained today which revealed moderate osteoarthritis.  The patient reports in the past she has had Visco gel injections which were very effective.  We will reapply for Visco gel injections for the right knee joint.  If she develops more frequent or severe mechanical symptoms we will proceed with scheduling an MRI of the right knee in the future.  Plan: XR KNEE 3 VIEW RIGHT  Raynaud's syndrome without gangrene: Not currently active.   Morton's neuroma of both feet: She has no tenderness on exam today.  She wears proper fitting shoes.   DDD (degenerative disc disease), cervical: She has limited ROM with discomfort.  Trapezius muscle tension and tenderness bilaterally.  She declined trigger point injections today.   DDD (degenerative disc disease), thoracic: No midline spinal tenderness.  Thoracic kyphosis noted.   Age-related osteoporosis without current pathological fracture - She is on Reclast IV yearly managed by Dr. Chalmers Cater.   Chronic pain syndrome - She takes hydrocodone BID PRN for pain relief. UDS & narc  agreement: 04/30/2019.  Other medical conditions are listed as follows:   Essential hypertension  History of gastroesophageal reflux (GERD)  Collagenous colitis  Esophageal motility disorder  History of type 2 diabetes mellitus    Orders: Orders Placed This Encounter  Procedures  . Large Joint Inj  . XR KNEE 3 VIEW RIGHT  . XR Shoulder Right  . QuantiFERON-TB Gold Plus   No orders of the defined types were placed in this encounter.   Face-to-face time spent with patient was 30 minutes. Greater than 50% of time was spent in counseling and coordination of care.  Follow-Up Instructions: Return in about 2 months (around 08/16/2019) for Psoriatic arthritis, Osteoarthritis.   Ofilia Neas, PA-C  Note - This record has been created using Dragon software.  Chart creation errors have been sought, but may not always  have been located. Such creation errors do not reflect on  the standard of medical care.

## 2019-06-08 NOTE — Telephone Encounter (Signed)
Received a fax from  [employer] regarding an approval for [place] from 06/05/2019 to 04/01/2020.   Reference number: [id number] Phone number:[phone]  Mariella Saa, PharmD, [place], [place] Clinical Specialty Pharmacist [phone]  06/08/2019 8:33 AM

## 2019-06-16 ENCOUNTER — Other Ambulatory Visit: Payer: Self-pay

## 2019-06-16 ENCOUNTER — Ambulatory Visit: Payer: PPO | Admitting: Physician Assistant

## 2019-06-16 ENCOUNTER — Encounter: Payer: Self-pay | Admitting: Physician Assistant

## 2019-06-16 ENCOUNTER — Ambulatory Visit: Payer: Self-pay

## 2019-06-16 ENCOUNTER — Telehealth: Payer: Self-pay

## 2019-06-16 VITALS — BP 134/83 | HR 89 | Resp 14 | Ht 58.5 in | Wt 143.2 lb

## 2019-06-16 DIAGNOSIS — M25511 Pain in right shoulder: Secondary | ICD-10-CM | POA: Diagnosis not present

## 2019-06-16 DIAGNOSIS — I7300 Raynaud's syndrome without gangrene: Secondary | ICD-10-CM | POA: Diagnosis not present

## 2019-06-16 DIAGNOSIS — G894 Chronic pain syndrome: Secondary | ICD-10-CM | POA: Diagnosis not present

## 2019-06-16 DIAGNOSIS — K224 Dyskinesia of esophagus: Secondary | ICD-10-CM

## 2019-06-16 DIAGNOSIS — I10 Essential (primary) hypertension: Secondary | ICD-10-CM

## 2019-06-16 DIAGNOSIS — M810 Age-related osteoporosis without current pathological fracture: Secondary | ICD-10-CM

## 2019-06-16 DIAGNOSIS — M25561 Pain in right knee: Secondary | ICD-10-CM | POA: Diagnosis not present

## 2019-06-16 DIAGNOSIS — M159 Polyosteoarthritis, unspecified: Secondary | ICD-10-CM

## 2019-06-16 DIAGNOSIS — Z79899 Other long term (current) drug therapy: Secondary | ICD-10-CM

## 2019-06-16 DIAGNOSIS — M5134 Other intervertebral disc degeneration, thoracic region: Secondary | ICD-10-CM | POA: Diagnosis not present

## 2019-06-16 DIAGNOSIS — G5763 Lesion of plantar nerve, bilateral lower limbs: Secondary | ICD-10-CM | POA: Diagnosis not present

## 2019-06-16 DIAGNOSIS — G8929 Other chronic pain: Secondary | ICD-10-CM

## 2019-06-16 DIAGNOSIS — L409 Psoriasis, unspecified: Secondary | ICD-10-CM

## 2019-06-16 DIAGNOSIS — L4050 Arthropathic psoriasis, unspecified: Secondary | ICD-10-CM

## 2019-06-16 DIAGNOSIS — M533 Sacrococcygeal disorders, not elsewhere classified: Secondary | ICD-10-CM

## 2019-06-16 DIAGNOSIS — M8949 Other hypertrophic osteoarthropathy, multiple sites: Secondary | ICD-10-CM

## 2019-06-16 DIAGNOSIS — Z8719 Personal history of other diseases of the digestive system: Secondary | ICD-10-CM

## 2019-06-16 DIAGNOSIS — K52831 Collagenous colitis: Secondary | ICD-10-CM

## 2019-06-16 DIAGNOSIS — M5030 Other cervical disc degeneration, unspecified cervical region: Secondary | ICD-10-CM | POA: Diagnosis not present

## 2019-06-16 DIAGNOSIS — Z8639 Personal history of other endocrine, nutritional and metabolic disease: Secondary | ICD-10-CM

## 2019-06-16 NOTE — Telephone Encounter (Signed)
Please apply for otezla, per Dr. Estanislado Pandy.   Patient was not consented at appointment, briefly discussed with Dr. Estanislado Pandy.

## 2019-06-16 NOTE — Telephone Encounter (Signed)
Submitted a Prior Authorization request to [facility] for [place] via Cover My Meds. Will update once we receive a response.   (KeyFredna Dow) - [id number]

## 2019-06-16 NOTE — Patient Instructions (Signed)
Standing Labs We placed an order today for your standing lab work.    Please come back and get your standing labs in April and every 3 months  TB gold due in June 2021   We have open lab daily Monday through Thursday from 8:30-12:30 PM and 1:30-4:30 PM and Friday from 8:30-12:30 PM and 1:30-4:00 PM at the office of Dr. Bo Merino.   You may experience shorter wait times on Monday and Friday afternoons. The office is located at [address], [place], [place], [place] [address] No appointment is necessary.   Labs are drawn by [facility].  You may receive a bill from [facility] for your lab work.  If you wish to have your labs drawn at another location, please call the office 24 hours in advance to send orders.  If you have any questions regarding directions or hours of operation,  please call [phone].   Just as a reminder please drink plenty of water prior to coming for your lab work. Thanks!

## 2019-06-16 NOTE — Telephone Encounter (Signed)
Please apply for right knee visco, per Taylor Dale, PA-C. Thanks!  

## 2019-06-17 DIAGNOSIS — E119 Type 2 diabetes mellitus without complications: Secondary | ICD-10-CM | POA: Diagnosis not present

## 2019-06-17 NOTE — Telephone Encounter (Signed)
Please call to schedule Visco Knee injections.  Authorized for [employer] series Right Knee Buy and Bill No PA required Deductible does not apply Insurance to cover 100% of allowable cost No Co-Pay

## 2019-06-17 NOTE — Telephone Encounter (Signed)
Received notification from [facility] regarding a prior authorization for [place]. Authorization has been APPROVED from 06/16/19 to 04/01/20.   Authorization # [id number]  Ran test claim, patient's copay for 1 month is $311.55. Patient has Medicare and can enroll for [employer]'s [facility] Patient Assistance program.  10:49 AM Beatriz Chancellor, CPhT

## 2019-06-19 ENCOUNTER — Telehealth: Payer: Self-pay | Admitting: Rheumatology

## 2019-06-19 DIAGNOSIS — R05 Cough: Secondary | ICD-10-CM | POA: Diagnosis not present

## 2019-06-19 DIAGNOSIS — J309 Allergic rhinitis, unspecified: Secondary | ICD-10-CM | POA: Diagnosis not present

## 2019-06-19 DIAGNOSIS — H1045 Other chronic allergic conjunctivitis: Secondary | ICD-10-CM | POA: Diagnosis not present

## 2019-06-19 DIAGNOSIS — J300 Vasomotor rhinitis: Secondary | ICD-10-CM | POA: Diagnosis not present

## 2019-06-19 NOTE — Telephone Encounter (Signed)
Patient called stating she had an appointment with Dr. Donneta Romberg at [facility] and was prescribed Breo inhaler to take everyday instead of her rescue inhaler.  Patient is requesting a return call to discuss taking another medication that weakens her immune system.

## 2019-06-19 NOTE — Telephone Encounter (Signed)
Returned patient call.  Advised that she can start Breo inhaler to better manage her breathing without concerns of interactions with other medications or increasing her risk of infection.  Informed patient that inhaled corticosteroids concentrate in the lungs and should not put her at increased risk.  She is concerned with taking Breo and daily prednisone.  Advised that we often give prednisone tapers for acute respiratory exacerbations and much higher doses.  She is taking a low-dose and should not increase risk of infection.  Patient verbalized understanding.   Mariella Saa, PharmD, [place], [place] Clinical Specialty Pharmacist [phone]  06/19/2019 2:44 PM

## 2019-06-23 ENCOUNTER — Encounter: Payer: Self-pay | Admitting: Certified Nurse Midwife

## 2019-06-23 DIAGNOSIS — M5412 Radiculopathy, cervical region: Secondary | ICD-10-CM | POA: Diagnosis not present

## 2019-06-23 DIAGNOSIS — M4312 Spondylolisthesis, cervical region: Secondary | ICD-10-CM | POA: Diagnosis not present

## 2019-06-25 ENCOUNTER — Telehealth: Payer: Self-pay | Admitting: Rheumatology

## 2019-06-25 MED ORDER — HYDROCODONE-ACETAMINOPHEN 5-325 MG PO TABS: ORAL_TABLET | ORAL | 0 refills | Status: DC

## 2019-06-25 NOTE — Telephone Encounter (Signed)
Patient called requesting prescription refill of Hydrocodone to be sent to [facility] in [place]. 

## 2019-06-25 NOTE — Telephone Encounter (Signed)
Last Visit: 06/16/19 Next Visit: 08/18/19 UDS: 04/30/19 Narc Agreement: 04/30/19  Okay to refill Hydrocodone?

## 2019-07-01 NOTE — Telephone Encounter (Signed)
Patient dropped off income documents and signed patient assistance application. Packet has been started in pending PAP folder.

## 2019-07-02 NOTE — Telephone Encounter (Signed)
Submitted Patient Assistance Application to [place] for [place] along with provider portion, PA and income documents. Will update patient when we receive a response.  Fax# [phone] Phone# [phone]  1:38 PM Beatriz Chancellor, CPhT

## 2019-07-04 ENCOUNTER — Other Ambulatory Visit: Payer: Self-pay | Admitting: Physician Assistant

## 2019-07-06 NOTE — Telephone Encounter (Signed)
Last Visit: 06/16/19 Next Visit: 08/18/19  Current Dose per office note on 06/16/19: prednisone 5 mg daily  Okay to refill per Dr. Estanislado Pandy

## 2019-07-07 ENCOUNTER — Telehealth: Payer: Self-pay | Admitting: *Deleted

## 2019-07-07 NOTE — Telephone Encounter (Signed)
[employer] patient assistance for [name] wanting to know why applying for patient assistance. [phone].

## 2019-07-07 NOTE — Telephone Encounter (Signed)
Returned call, advised patient's copay is unaffordable for her. Rep advised that nothing further is needed, they will move forward to process her application

## 2019-07-09 ENCOUNTER — Telehealth: Payer: Self-pay | Admitting: Rheumatology

## 2019-07-09 NOTE — Telephone Encounter (Signed)
She can increase prednisone to 10 mg daily x1 week then reduce to 7.5 mg for 1 week.  She will then resume 5 mg po daily.  Please advise patient to monitor blood glucose closely while taking prednisone.

## 2019-07-09 NOTE — Telephone Encounter (Signed)
Patient left a voicemail stating she just took her Cosentyx on Tuesday and her hands are extremely painful.  Patient states her pointer finger on both hands especially the first joint hurt so bad she is not able to hold a book.  Patient states she has to walk around holding her pointer finger on both hands up so they don't touch anything.  Patient is requesting a dose pack to take until she is able to get on the new medication.

## 2019-07-09 NOTE — Telephone Encounter (Signed)
Attempted to contact patient and left message for patient to call the office.  

## 2019-07-09 NOTE — Telephone Encounter (Signed)
Patient advised can increase prednisone to 10 mg daily x1 week then reduce to 7.5 mg for 1 week.  She will then resume 5 mg po daily.  Patient advised to monitor blood glucose closely while taking prednisone.

## 2019-07-13 ENCOUNTER — Other Ambulatory Visit: Payer: Self-pay | Admitting: *Deleted

## 2019-07-13 ENCOUNTER — Other Ambulatory Visit: Payer: Self-pay

## 2019-07-13 ENCOUNTER — Ambulatory Visit (INDEPENDENT_AMBULATORY_CARE_PROVIDER_SITE_OTHER): Payer: PPO | Admitting: Physician Assistant

## 2019-07-13 DIAGNOSIS — Z79899 Other long term (current) drug therapy: Secondary | ICD-10-CM | POA: Diagnosis not present

## 2019-07-13 DIAGNOSIS — G8929 Other chronic pain: Secondary | ICD-10-CM

## 2019-07-13 DIAGNOSIS — Z5181 Encounter for therapeutic drug level monitoring: Secondary | ICD-10-CM | POA: Diagnosis not present

## 2019-07-13 DIAGNOSIS — M1711 Unilateral primary osteoarthritis, right knee: Secondary | ICD-10-CM

## 2019-07-13 MED ORDER — HYALURONAN 30 MG/2ML IX SOSY
30.0000 mg | PREFILLED_SYRINGE | INTRA_ARTICULAR | Status: AC | PRN
  Administered 2019-07-13: 30 mg via INTRA_ARTICULAR

## 2019-07-13 MED ORDER — LIDOCAINE HCL 1 % IJ SOLN
1.5000 mL | INTRAMUSCULAR | Status: AC | PRN
  Administered 2019-07-13: 1.5 mL

## 2019-07-13 NOTE — Progress Notes (Signed)
   Procedure Note  Patient: Tonya Hall             Date of Birth: 10/11/1950           MRN: [id number]             Visit Date: 07/13/2019  Procedures: Visit Diagnoses:  1. Primary osteoarthritis of right knee   Orthovisc #1 Right knee joint injection   Large Joint Inj: R knee on 07/13/2019 8:40 AM Indications: pain Details: 25 G 1.5 in needle, medial approach  Arthrogram: No  Medications: 1.5 mL lidocaine 1 %; 30 mg Hyaluronan 30 MG/2ML Aspirate: 0 mL Outcome: tolerated well, no immediate complications Procedure, treatment alternatives, risks and benefits explained, specific risks discussed. Consent was given by the patient. Immediately prior to procedure a time out was called to verify the correct patient, procedure, equipment, support staff and site/side marked as required. Patient was prepped and draped in the usual sterile fashion.      Patient tolerated the procedure well. Aftercare was discussed.  Hazel Sams, PA-C

## 2019-07-14 NOTE — Progress Notes (Signed)
WBC count is high due to prednisone use.  Her LFTs have increased.  Please advise her not to take any NSAIDs or Tylenol.  She should hold [place] and repeat labs in 2 weeks.  If her LFTs are normal then we will reintroduce Arava.

## 2019-07-15 LAB — PAIN MGMT, PROFILE 5 W/CONF, U
Amphetamines: NEGATIVE ng/mL
Barbiturates: NEGATIVE ng/mL
Benzodiazepines: NEGATIVE ng/mL
Cocaine Metabolite: NEGATIVE ng/mL
Codeine: NEGATIVE ng/mL
Creatinine: 24.5 mg/dL
Hydrocodone: 236 ng/mL
Hydromorphone: NEGATIVE ng/mL
Marijuana Metabolite: NEGATIVE ng/mL
Methadone Metabolite: NEGATIVE ng/mL
Morphine: NEGATIVE ng/mL
Norhydrocodone: 291 ng/mL
Opiates: POSITIVE ng/mL
Oxidant: NEGATIVE ug/mL
Oxycodone: NEGATIVE ng/mL
pH: 6.2 (ref 4.5–9.0)

## 2019-07-15 LAB — COMPLETE METABOLIC PANEL WITH GFR
AG Ratio: 1.6 (calc) (ref 1.0–2.5)
ALT: 87 U/L — ABNORMAL HIGH (ref 6–29)
AST: 46 U/L — ABNORMAL HIGH (ref 10–35)
Albumin: 4 g/dL (ref 3.6–5.1)
Alkaline phosphatase (APISO): 66 U/L (ref 37–153)
BUN: 25 mg/dL (ref 7–25)
CO2: 21 mmol/L (ref 20–32)
Calcium: 9.6 mg/dL (ref 8.6–10.4)
Chloride: 103 mmol/L (ref 98–110)
Creat: 0.91 mg/dL (ref 0.50–0.99)
GFR, Est African American: 75 mL/min/{1.73_m2} (ref >=60)
GFR, Est Non African American: 64 mL/min/{1.73_m2} (ref >=60)
Globulin: 2.5 g/dL (calc) (ref 1.9–3.7)
Glucose, Bld: 124 mg/dL — ABNORMAL HIGH (ref 65–99)
Potassium: 4.3 mmol/L (ref 3.5–5.3)
Sodium: 134 mmol/L — ABNORMAL LOW (ref 135–146)
Total Bilirubin: 0.3 mg/dL (ref 0.2–1.2)
Total Protein: 6.5 g/dL (ref 6.1–8.1)

## 2019-07-15 LAB — CBC WITH DIFFERENTIAL/PLATELET
Absolute Monocytes: 342 cells/uL (ref 200–950)
Basophils Absolute: 73 cells/uL (ref 0–200)
Basophils Relative: 0.6 %
Eosinophils Absolute: 85 cells/uL (ref 15–500)
Eosinophils Relative: 0.7 %
HCT: 36.4 % (ref 35.0–45.0)
Hemoglobin: 12 g/dL (ref 11.7–15.5)
Lymphs Abs: 744 cells/uL — ABNORMAL LOW (ref 850–3900)
MCH: 31.7 pg (ref 27.0–33.0)
MCHC: 33 g/dL (ref 32.0–36.0)
MCV: 96.3 fL (ref 80.0–100.0)
MPV: 10.9 fL (ref 7.5–12.5)
Monocytes Relative: 2.8 %
Neutro Abs: 10956 cells/uL — ABNORMAL HIGH (ref 1500–7800)
Neutrophils Relative %: 89.8 %
Platelets: 228 10*3/uL (ref 140–400)
RBC: 3.78 10*6/uL — ABNORMAL LOW (ref 3.80–5.10)
RDW: 13.1 % (ref 11.0–15.0)
Total Lymphocyte: 6.1 %
WBC: 12.2 10*3/uL — ABNORMAL HIGH (ref 3.8–10.8)

## 2019-07-16 ENCOUNTER — Telehealth: Payer: Self-pay | Admitting: Rheumatology

## 2019-07-16 NOTE — Telephone Encounter (Signed)
Patient called stating she received her prescription of [name] today and is requesting a return call to confirm it is okay to take it.

## 2019-07-16 NOTE — Telephone Encounter (Signed)
Received a fax from  [place] regarding an approval for [place] from 07/09/19 to 04/01/2020. Will send document to scan center.  Reference number:[id number] Phone number:[phone]   Mariella Saa, PharmD, [place], CPP Clinical Specialty Pharmacist [phone]  07/16/2019 8:28 AM

## 2019-07-16 NOTE — Telephone Encounter (Signed)
Returned patient call and no answer.  Left voicemail instructing patient that she can go ahead and proceed with starting Otezla.  Reminded to take with food as it can cause some stomach upset.  Instructed patient to call with any other questions or concerns.   Mariella Saa, PharmD, [place], [place] Clinical Specialty Pharmacist [phone]  07/16/2019 11:22 AM

## 2019-07-20 ENCOUNTER — Other Ambulatory Visit: Payer: Self-pay

## 2019-07-20 ENCOUNTER — Ambulatory Visit (INDEPENDENT_AMBULATORY_CARE_PROVIDER_SITE_OTHER): Payer: PPO | Admitting: Physician Assistant

## 2019-07-20 DIAGNOSIS — M1711 Unilateral primary osteoarthritis, right knee: Secondary | ICD-10-CM

## 2019-07-20 MED ORDER — HYALURONAN 30 MG/2ML IX SOSY
30.0000 mg | PREFILLED_SYRINGE | INTRA_ARTICULAR | Status: AC | PRN
  Administered 2019-07-20: 30 mg via INTRA_ARTICULAR

## 2019-07-20 MED ORDER — LIDOCAINE HCL 1 % IJ SOLN
1.5000 mL | INTRAMUSCULAR | Status: AC | PRN
  Administered 2019-07-20: 1.5 mL

## 2019-07-20 NOTE — Progress Notes (Signed)
   Procedure Note  Patient: Tonya Hall             Date of Birth: [birth year]           MRN: [id number]             Visit Date: 07/20/2019  Procedures: Visit Diagnoses:  1. Primary osteoarthritis of right knee    Orthovisc #2 Right knee joint injection  Large Joint Inj: R knee on 07/20/2019 2:03 PM Indications: pain Details: 25 G 1.5 in needle, medial approach  Arthrogram: No  Medications: 1.5 mL lidocaine 1 %; 30 mg Hyaluronan 30 MG/2ML Aspirate: 0 mL Outcome: tolerated well, no immediate complications Procedure, treatment alternatives, risks and benefits explained, specific risks discussed. Consent was given by the patient. Immediately prior to procedure a time out was called to verify the correct patient, procedure, equipment, support staff and site/side marked as required. Patient was prepped and draped in the usual sterile fashion.     Patient tolerated the procedure well. Aftercare was discussed.  Hazel Sams, PA-C

## 2019-07-27 ENCOUNTER — Other Ambulatory Visit: Payer: Self-pay

## 2019-07-27 ENCOUNTER — Ambulatory Visit (INDEPENDENT_AMBULATORY_CARE_PROVIDER_SITE_OTHER): Payer: PPO | Admitting: Physician Assistant

## 2019-07-27 DIAGNOSIS — Z79899 Other long term (current) drug therapy: Secondary | ICD-10-CM | POA: Diagnosis not present

## 2019-07-27 DIAGNOSIS — M1711 Unilateral primary osteoarthritis, right knee: Secondary | ICD-10-CM

## 2019-07-27 LAB — COMPLETE METABOLIC PANEL WITH GFR
AG Ratio: 1.9 (calc) (ref 1.0–2.5)
ALT: 51 U/L — ABNORMAL HIGH (ref 6–29)
AST: 35 U/L (ref 10–35)
Albumin: 4.1 g/dL (ref 3.6–5.1)
Alkaline phosphatase (APISO): 57 U/L (ref 37–153)
BUN: 22 mg/dL (ref 7–25)
CO2: 20 mmol/L (ref 20–32)
Calcium: 9.8 mg/dL (ref 8.6–10.4)
Chloride: 101 mmol/L (ref 98–110)
Creat: 0.89 mg/dL (ref 0.50–0.99)
GFR, Est African American: 77 mL/min/{1.73_m2} (ref >=60)
GFR, Est Non African American: 66 mL/min/{1.73_m2} (ref >=60)
Globulin: 2.2 g/dL (calc) (ref 1.9–3.7)
Glucose, Bld: 163 mg/dL — ABNORMAL HIGH (ref 65–99)
Potassium: 4.5 mmol/L (ref 3.5–5.3)
Sodium: 133 mmol/L — ABNORMAL LOW (ref 135–146)
Total Bilirubin: 0.3 mg/dL (ref 0.2–1.2)
Total Protein: 6.3 g/dL (ref 6.1–8.1)

## 2019-07-27 LAB — CBC WITH DIFFERENTIAL/PLATELET
Absolute Monocytes: 559 cells/uL (ref 200–950)
Basophils Absolute: 106 cells/uL (ref 0–200)
Basophils Relative: 0.7 %
Eosinophils Absolute: 106 cells/uL (ref 15–500)
Eosinophils Relative: 0.7 %
HCT: 37.8 % (ref 35.0–45.0)
Hemoglobin: 12.4 g/dL (ref 11.7–15.5)
Lymphs Abs: 785 cells/uL — ABNORMAL LOW (ref 850–3900)
MCH: 31.8 pg (ref 27.0–33.0)
MCHC: 32.8 g/dL (ref 32.0–36.0)
MCV: 96.9 fL (ref 80.0–100.0)
MPV: 11.2 fL (ref 7.5–12.5)
Monocytes Relative: 3.7 %
Neutro Abs: 13545 cells/uL — ABNORMAL HIGH (ref 1500–7800)
Neutrophils Relative %: 89.7 %
Platelets: 257 10*3/uL (ref 140–400)
RBC: 3.9 10*6/uL (ref 3.80–5.10)
RDW: 12.9 % (ref 11.0–15.0)
Total Lymphocyte: 5.2 %
WBC: 15.1 10*3/uL — ABNORMAL HIGH (ref 3.8–10.8)

## 2019-07-27 MED ORDER — HYALURONAN 30 MG/2ML IX SOSY
30.0000 mg | PREFILLED_SYRINGE | INTRA_ARTICULAR | Status: AC | PRN
  Administered 2019-07-27: 30 mg via INTRA_ARTICULAR

## 2019-07-27 MED ORDER — LIDOCAINE HCL 1 % IJ SOLN
1.5000 mL | INTRAMUSCULAR | Status: AC | PRN
  Administered 2019-07-27: 1.5 mL

## 2019-07-27 NOTE — Progress Notes (Signed)
   Procedure Note  Patient: Tonya Hall             Date of Birth: 1951-03-05           MRN: [id number]             Visit Date: 07/27/2019  Procedures: Visit Diagnoses:  1. Primary osteoarthritis of right knee   2. High risk medication use    Orthovisc #3 Right knee joint injection  Large Joint Inj: R knee on 07/27/2019 8:18 AM Indications: pain Details: 25 G 1.5 in needle, medial approach  Arthrogram: No  Medications: 1.5 mL lidocaine 1 %; 30 mg Hyaluronan 30 MG/2ML Aspirate: 0 mL Outcome: tolerated well, no immediate complications Procedure, treatment alternatives, risks and benefits explained, specific risks discussed. Consent was given by the patient. Immediately prior to procedure a time out was called to verify the correct patient, procedure, equipment, support staff and site/side marked as required. Patient was prepped and draped in the usual sterile fashion.     Patient tolerated the procedure well. Aftercare was discussed.  Hazel Sams, PA-C

## 2019-07-28 ENCOUNTER — Other Ambulatory Visit: Payer: Self-pay

## 2019-07-28 DIAGNOSIS — L570 Actinic keratosis: Secondary | ICD-10-CM | POA: Diagnosis not present

## 2019-07-28 DIAGNOSIS — Z79899 Other long term (current) drug therapy: Secondary | ICD-10-CM

## 2019-07-28 MED ORDER — LEFLUNOMIDE 10 MG PO TABS
10.0000 mg | ORAL_TABLET | ORAL | 0 refills | Status: DC
Start: 2019-07-28 — End: 2019-11-04

## 2019-07-28 NOTE — Progress Notes (Signed)
Glucose is elevated due to prednisone use.  ALT has improved but is still elevated.  Avoid NSAIDs and alcohol use.  White cell count is elevated due to prednisone use.

## 2019-08-05 DIAGNOSIS — E559 Vitamin D deficiency, unspecified: Secondary | ICD-10-CM | POA: Diagnosis not present

## 2019-08-06 ENCOUNTER — Telehealth: Payer: Self-pay | Admitting: Rheumatology

## 2019-08-06 NOTE — Telephone Encounter (Signed)
Last Visit: 06/16/19 Next Visit: 08/18/19 UDS: 07/13/2019 Narc Agreement: 07/13/2019   Last Fill 06/25/2019  Okay to refill Hydrocodone?

## 2019-08-06 NOTE — Telephone Encounter (Signed)
Patient called requesting prescription refill of Hydrocodone to be sent to [facility] in [place]. 

## 2019-08-07 MED ORDER — HYDROCODONE-ACETAMINOPHEN 5-325 MG PO TABS: ORAL_TABLET | ORAL | 0 refills | Status: DC

## 2019-08-10 NOTE — Progress Notes (Signed)
Office Visit Note  Patient: Tonya Hall             Date of Birth: [birth year]           MRN: [id number]             PCP: [name], MD Referring: [name], MD Visit Date: 08/18/2019 Occupation: @GUAROCC @  Subjective:  Left trochanteric bursitis   History of Present Illness: Tonya Hall is a 69 y.o. female with history of psoriatic arthritis, osteoarthritis, osteoporosis, and DDD.  Patient is on Cosentyx 150 mg subcutaneous injections every 14 days, Arava 10 mg 1 tablet by mouth every other day, Otezla 30 mg 1 tablet twice daily, and prednisone 5 mg daily.  She has started to notice an improvement in the tenderness and inflammation in both hands since adding on [place].  She has been tolerating Otezla without any side effects.  She is currently on the reduced dose of Arava due to elevated LFTs.  She continues to have chronic pain in both SI joints.  She is also having increased discomfort in her right shoulder.  She states that the cortisone injection performed on 06/16/2019 provided pain relief for about 3 weeks.  She states that her right knee joint pain resolved after having Visco gel injections in April 2021.  She presents today with trochanter bursitis of the left hip and requested a cortisone injection.   Activities of Daily Living:  Patient reports morning stiffness for 3 hours.   Patient Denies nocturnal pain.  Difficulty dressing/grooming: Reports Difficulty climbing stairs: Reports Difficulty getting out of chair: Reports Difficulty using hands for taps, buttons, cutlery, and/or writing: Reports  Review of Systems  Constitutional: Positive for fatigue.  HENT: Positive for mouth dryness. Negative for mouth sores and nose dryness.   Eyes: Positive for dryness. Negative for pain and visual disturbance.  Respiratory: Negative for cough, hemoptysis, shortness of breath and difficulty breathing.   Cardiovascular: Positive for swelling in legs/feet. Negative for chest pain,  palpitations and hypertension.  Gastrointestinal: Negative for blood in stool, constipation and diarrhea.  Endocrine: Negative for increased urination.  Genitourinary: Negative for difficulty urinating and painful urination.  Musculoskeletal: Positive for arthralgias, joint pain, joint swelling, muscle weakness, morning stiffness and muscle tenderness. Negative for myalgias and myalgias.  Skin: Positive for rash. Negative for color change, pallor, hair loss, nodules/bumps, redness, skin tightness, ulcers and sensitivity to sunlight.  Allergic/Immunologic: Negative for susceptible to infections.  Neurological: Negative for dizziness and headaches.  Hematological: Positive for bruising/bleeding tendency. Negative for swollen glands.  Psychiatric/Behavioral: Negative for depressed mood and sleep disturbance. The patient is not nervous/anxious.     PMFS History:  Patient Active Problem List   Diagnosis Date Noted  . AKI (acute kidney injury) ([place]) 02/07/2019  . Acute respiratory failure with hypoxia ([place]) 02/06/2019  . Acute respiratory disease due to COVID-19 virus 02/06/2019  . Community acquired pneumonia 02/06/2019  . Hyponatremia 02/06/2019  . Asthma   . Diabetes mellitus without complication ([place])   . Closed nondisplaced odontoid fracture with type II morphology ([place]) 01/27/2018  . Odontoid fracture ([place]) 01/24/2018  . Chest pain 11/14/2017  . Transaminitis 06/22/2016  . Collagenous colitis 05/23/2016  . Psoriasis 05/21/2016  . High risk medication use 02/15/2016  . Age-related osteoporosis without current pathological fracture 02/15/2016  . Trochanteric bursitis, left hip 02/15/2016  . Sacroiliitis, not elsewhere classified ([place]) 02/15/2016  . Chronic pain syndrome 02/15/2016  . Abnormal weight gain 06/25/2012  . Hypertension  07/03/2011  . Hypercholesteremia 07/03/2011  . Psoriatic arthritis ([place]) 07/03/2011  . Osteoarthritis of multiple joints 07/03/2011  . Esophageal  motility disorder 02/14/2011  . Cough 02/14/2011  . GERD 05/05/2010    Past Medical History:  Diagnosis Date  . Asthma    Albuterol in haler prn  . Cataract    immature unsure which eye  . Chronic back pain   . COVID-19 October/November 2020  . Degenerative disk disease    psoriatic  . Diabetes mellitus without complication (HCC)    diet controlled  . Esophageal motility disorder    Non-specific, see modified barium study/speech path, BP  . GERD (gastroesophageal reflux disease)    takes Protonix bid  . History of blood transfusion    post c-section  . History of bronchitis   . HTN (hypertension)   . Hyperlipidemia    takes Pravastatin daily  . Lymphocytic colitis 05/26/2010   Responded to Entocort x 3 MOS  . Neuropathy   . Nonallergic rhinitis   . Osteoporosis    gets Boniva every 3 months  . Peripheral edema    takes HCTZ daily  . PONV (postoperative nausea and vomiting)   . Psoriatic arthritis ([place])    Dr. Katherina Right  . Raynaud's disease   . Seasonal allergies   . Shingles 2020  . SUI (stress urinary incontinence, female)   . Urinary urgency     Family History  Problem Relation Age of Onset  . Diabetes Mother   . Pancreatitis Mother   . Arthritis Mother        psoriatic   . Fibromyalgia Mother   . Rheumatic fever Father   . Diabetes Other        grandparents  . Skin cancer Other        grandfather  . Hypertension Other        grandparent  . Congestive Heart Failure Other        grandfather  . Parkinson's disease Other        grandmother  . Colon cancer Maternal Uncle   . Fibromyalgia Sister   . Rheum arthritis Sister   . Diabetes Sister   . Fibromyalgia Sister   . Diabetes Sister   . Rheum arthritis Sister   . Hypertension Son   . Colon polyps Neg Hx    Past Surgical History:  Procedure Laterality Date  . BIOPSY  04/17/2016   Procedure: BIOPSY;  Surgeon: Danie Binder, MD;  [facility];  Service: Endoscopy;;  random colon bx's    . CERVICAL SPINE SURGERY  09/09/2017  . CESAREAN SECTION  1974, 1978      . COLONOSCOPY  06/19/2008   [id number] internal hemorrhoids/mild sigmoin colon diverticulosis  . COLONOSCOPY  2012   Dr. Gala Romney: normal rectum, diverticula, lymphocytic colitis   . COLONOSCOPY WITH PROPOFOL N/A 04/17/2016   Procedure: COLONOSCOPY WITH PROPOFOL;  Surgeon: Danie Binder, MD;  [facility]: [facility];  Service: Endoscopy;  Laterality: N/A;  900  . DILATION AND CURETTAGE OF UTERUS  1977   spontaneous abortion  . ESOPHAGOGASTRODUODENOSCOPY  06/19/2008   [id number] gastritis  . LUMBAR DISC ARTHROPLASTY  7/98  . LUMBAR FUSION  6/03, 11/08, 11/14   L4-5 fusion, L2-3 fusion, L1-2  . ODONTOID SCREW INSERTION N/A 01/27/2018   Procedure: ODONTOID SCREW INSERTION;  Surgeon: Newman Pies, MD;  Location: [facility];  Service: Neurosurgery;  Laterality: N/A;  ODONTOID SCREW INSERTION  . TONSILLECTOMY AND ADENOIDECTOMY    .  TUBAL LIGATION     Social History   Social History Narrative   ATTENDS [employer]. 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   Immunization History  Administered Date(s) Administered  . Influenza,inj,Quad PF,6+ Mos 01/21/2017  . Influenza-Unspecified 01/30/2017  . Pneumococcal Polysaccharide-23 02/19/2013  . Tdap 09/10/2016     Objective: Vital Signs: BP (!) 152/85 (BP Location: Right Arm, Patient Position: Sitting, Cuff Size: Normal)   Pulse (!) 109   Resp 16   Ht 4' 11.5" (1.511 m)   Wt 145 lb (65.8 kg)   LMP 02/01/1999   BMI 28.80 kg/m    Physical Exam Vitals and nursing note reviewed.  Constitutional:      Appearance: She is well-developed.  HENT:     Head: Normocephalic and atraumatic.  Eyes:     Conjunctiva/sclera: Conjunctivae normal.  Pulmonary:     Effort: Pulmonary effort is normal.  Abdominal:     General: Bowel sounds are normal.     Palpations: Abdomen is soft.  Musculoskeletal:     Cervical back: Normal range of motion.  Lymphadenopathy:     Cervical:  No cervical adenopathy.  Skin:    General: Skin is warm and dry.     Capillary Refill: Capillary refill takes less than 2 seconds.  Neurological:     Mental Status: She is alert and oriented to person, place, and time.  Psychiatric:        Behavior: Behavior normal.      Musculoskeletal Exam: C-spine limited range of motion with lateral rotation.  Thoracic kyphosis noted.  Painful range of motion lumbar spine.  No midline spinal tenderness.  She has tenderness over bilateral SI joints.  She has limited internal rotation of the right shoulder.  Left shoulder has full range of motion with no discomfort.  She has tenderness to palpation over the joint line of both elbow joints.  Wrist joints have good range of motion with no inflammation.  She has tenderness on the ulnar aspect of the right wrist.  She has severe PIP and DIP thickening.  She has tenderness and synovitis of several PIP joints as described below.  She has incomplete fist formation bilaterally.  Hip joints have good range of motion with no discomfort.  She has tenderness over the left trochanteric bursa.  Knee joints have good range of motion with no warmth or effusion.  Ankle joints have good range of motion with no tenderness or inflammation.  She has tenderness along the plantar aspect of both feet.  No tenderness of MTP joints.  CDAI Exam: CDAI Score: -- Patient Global: --; Provider Global: -- Swollen: 3 ; Tender: 6  Joint Exam 08/18/2019      Right  Left  Elbow   Tender   Tender  Wrist   Tender     PIP 2     Swollen Tender  PIP 3  Swollen Tender     PIP 5     Swollen Tender     Investigation: No additional findings.  Imaging: No results found.  Recent Labs: Lab Results  Component Value Date   WBC 15.1 (H) 07/27/2019   HGB 12.4 07/27/2019   PLT 257 07/27/2019   NA 133 (L) 07/27/2019   K 4.5 07/27/2019   CL 101 07/27/2019   CO2 20 07/27/2019   GLUCOSE 163 (H) 07/27/2019   BUN 22 07/27/2019   CREATININE 0.89  07/27/2019   BILITOT 0.3 07/27/2019   ALKPHOS 56 02/10/2019   AST 35 07/27/2019  ALT 51 (H) 07/27/2019   PROT 6.3 07/27/2019   ALBUMIN 2.7 (L) 02/10/2019   CALCIUM 9.8 07/27/2019   GFRAA 77 07/27/2019   QFTBGOLD Negative 05/05/2015   QFTBGOLDPLUS NEGATIVE 09/04/2018    Speciality Comments: No specialty comments available.  Procedures:  Large Joint Inj: L greater trochanter on 08/18/2019 11:59 AM Indications: pain Details: 27 G 1.5 in needle, lateral approach  Arthrogram: No  Medications: 40 mg triamcinolone acetonide 40 MG/ML; 1.5 mL lidocaine 1 % Aspirate: 0 mL Outcome: tolerated well, no immediate complications Procedure, treatment alternatives, risks and benefits explained, specific risks discussed. Consent was given by the patient. Immediately prior to procedure a time out was called to verify the correct patient, procedure, equipment, support staff and site/side marked as required. Patient was prepped and draped in the usual sterile fashion.     Allergies: Adalimumab, Imuran [azathioprine sodium], Methotrexate, Plaquenil [hydroxychloroquine sulfate], Sulfonamide derivatives, Ceftin [cefuroxime axetil], and Morphine   Assessment / Plan:     Visit Diagnoses: Psoriatic arthritis ([place]): She has ongoing tenderness and synovitis as described above.  The discomfort and stiffness in both hands have started to improve on combination therapy.  She continues to have SI joint pain bilaterally, but her left SI joint discomfort has improved since having a cortisone injection on 04/07/2019.  She continues to have intermittent plantar fasciitis but is not having any Achilles tendinitis.   She is on Cosentyx 150 mg subcutaneous injections every 14 days, Arava 10 mg 1 tablet by mouth every other day, and Otezla 30 mg twice daily.  Prior to adding on [place] she was having increased pain and inflammation in both hands and increase the dose of prednisone to 10 mg for 1 week, 7.5 mg for 1 week, and then  resume at 5 mg of prednisone daily.  She was started on [place] on 07/16/2019 and is started to notice improvement in the discomfort and inflammation in both hands.  She is tolerating Otezla without any side effects.  She is on the reduced dose of Arava due to elevated LFTs.  We will recheck lab work today.  She will continue on the current treatment regimen.  She was advised to notify us if she develops increased joint pain or joint swelling.  She will follow up in 3 months.   Psoriasis: She has no active psoriasis at this time.   High risk medication use - Cosentyx 150 mg sq injections every 14 days, Arava 10 mg 1 tablet by mouth every other day (reduced dose due to elevated LFTs), Otezla 30 mg BID (started on 07/16/19), and prednisone 5 mg po daily.  CBC and CMP were drawn on 07/27/2019.  ALT was 51 and AST was within normal limits.  We will recheck lab work today.- Plan: CBC with Differential/Platelet, COMPLETE METABOLIC PANEL WITH GFR  Trochanteric bursitis, left hip: She has tenderness to palpation over the left trochanteric bursa.  She requested a cortisone injection today.  She tolerated procedure well.  The procedure note was completed above.  Aftercare was discussed.  She was given a handout of exercises to perform on a daily basis.  Chronic left SI joint pain: Chronic pain.  She has tenderness over the left SI joint.  She had a left SI joint cortisone injection performed on 04/07/2019 which provided significant relief for about 2 months.  Primary osteoarthritis of right knee: Doing well.  She has good range of motion of the right knee joint on exam.  No warmth or effusion was  noted.  Her discomfort has resolved since having Visco gel injections in April 2021.  Raynaud's syndrome without gangrene: Not currently active.  No digital ulcerations or signs of gangrene.  Morton's neuroma of both feet: She experiences intermittent discomfort.  We discussed the importance of wearing proper fitting  shoes.  DDD (degenerative disc disease), cervical: She has limited range of motion with lateral rotation.  No symptoms of radiculopathy currently.  She is been experiencing increased trapezius muscle tension and muscle tenderness bilaterally.  She got a massage last week and plans on getting massage the end of this week.  DDD (degenerative disc disease), thoracic: Thoracic kyphosis noted.  No midline spinal tenderness.  Age-related osteoporosis without current pathological fracture: She is taking calcium and vitamin D supplement daily.  She is on Reclast IV infusions for management of osteoporosis  Chronic pain syndrome: She is taking hydrocodone as needed for pain relief.  She does not need a refill at this time.  Other medical conditions are listed as follows:  Senile purpura ([place])  Essential hypertension  History of gastroesophageal reflux (GERD)  Collagenous colitis  Esophageal motility disorder  History of type 2 diabetes mellitus  Orders: Orders Placed This Encounter  Procedures  . Large Joint Inj  . CBC with Differential/Platelet  . COMPLETE METABOLIC PANEL WITH GFR   No orders of the defined types were placed in this encounter.   Face-to-face time spent with patient was 30 minutes. Greater than 50% of time was spent in counseling and coordination of care.  Follow-Up Instructions: Return in about 3 months (around 11/18/2019) for Psoriatic arthritis, Osteoarthritis.   Ofilia Neas, PA-C  Note - This record has been created using Dragon software.  Chart creation errors have been sought, but may not always  have been located. Such creation errors do not reflect on  the standard of medical care.

## 2019-08-13 ENCOUNTER — Other Ambulatory Visit: Payer: Self-pay | Admitting: Rheumatology

## 2019-08-13 NOTE — Telephone Encounter (Signed)
Last Visit: 06/16/19 Next Visit: 08/18/19  Okay to refill Dr. Estanislado Pandy

## 2019-08-18 ENCOUNTER — Encounter: Payer: Self-pay | Admitting: Physician Assistant

## 2019-08-18 ENCOUNTER — Other Ambulatory Visit: Payer: Self-pay

## 2019-08-18 ENCOUNTER — Ambulatory Visit: Payer: PPO | Admitting: Physician Assistant

## 2019-08-18 ENCOUNTER — Other Ambulatory Visit: Payer: Self-pay | Admitting: Rheumatology

## 2019-08-18 VITALS — BP 152/85 | HR 109 | Resp 16 | Ht 59.5 in | Wt 145.0 lb

## 2019-08-18 DIAGNOSIS — M810 Age-related osteoporosis without current pathological fracture: Secondary | ICD-10-CM | POA: Diagnosis not present

## 2019-08-18 DIAGNOSIS — I10 Essential (primary) hypertension: Secondary | ICD-10-CM

## 2019-08-18 DIAGNOSIS — Z8719 Personal history of other diseases of the digestive system: Secondary | ICD-10-CM

## 2019-08-18 DIAGNOSIS — I7300 Raynaud's syndrome without gangrene: Secondary | ICD-10-CM | POA: Diagnosis not present

## 2019-08-18 DIAGNOSIS — Z79899 Other long term (current) drug therapy: Secondary | ICD-10-CM

## 2019-08-18 DIAGNOSIS — M1711 Unilateral primary osteoarthritis, right knee: Secondary | ICD-10-CM | POA: Diagnosis not present

## 2019-08-18 DIAGNOSIS — G5763 Lesion of plantar nerve, bilateral lower limbs: Secondary | ICD-10-CM | POA: Diagnosis not present

## 2019-08-18 DIAGNOSIS — M7062 Trochanteric bursitis, left hip: Secondary | ICD-10-CM | POA: Diagnosis not present

## 2019-08-18 DIAGNOSIS — L4050 Arthropathic psoriasis, unspecified: Secondary | ICD-10-CM | POA: Diagnosis not present

## 2019-08-18 DIAGNOSIS — M533 Sacrococcygeal disorders, not elsewhere classified: Secondary | ICD-10-CM | POA: Diagnosis not present

## 2019-08-18 DIAGNOSIS — G894 Chronic pain syndrome: Secondary | ICD-10-CM | POA: Diagnosis not present

## 2019-08-18 DIAGNOSIS — K224 Dyskinesia of esophagus: Secondary | ICD-10-CM

## 2019-08-18 DIAGNOSIS — M5030 Other cervical disc degeneration, unspecified cervical region: Secondary | ICD-10-CM | POA: Diagnosis not present

## 2019-08-18 DIAGNOSIS — M5134 Other intervertebral disc degeneration, thoracic region: Secondary | ICD-10-CM | POA: Diagnosis not present

## 2019-08-18 DIAGNOSIS — Z8639 Personal history of other endocrine, nutritional and metabolic disease: Secondary | ICD-10-CM

## 2019-08-18 DIAGNOSIS — L409 Psoriasis, unspecified: Secondary | ICD-10-CM | POA: Diagnosis not present

## 2019-08-18 DIAGNOSIS — G8929 Other chronic pain: Secondary | ICD-10-CM

## 2019-08-18 DIAGNOSIS — K52831 Collagenous colitis: Secondary | ICD-10-CM

## 2019-08-18 DIAGNOSIS — D692 Other nonthrombocytopenic purpura: Secondary | ICD-10-CM

## 2019-08-18 LAB — COMPLETE METABOLIC PANEL WITH GFR
AG Ratio: 1.6 (calc) (ref 1.0–2.5)
ALT: 44 U/L — ABNORMAL HIGH (ref 6–29)
AST: 33 U/L (ref 10–35)
Albumin: 4.1 g/dL (ref 3.6–5.1)
Alkaline phosphatase (APISO): 59 U/L (ref 37–153)
BUN: 24 mg/dL (ref 7–25)
CO2: 23 mmol/L (ref 20–32)
Calcium: 9.8 mg/dL (ref 8.6–10.4)
Chloride: 103 mmol/L (ref 98–110)
Creat: 0.9 mg/dL (ref 0.50–0.99)
GFR, Est African American: 76 mL/min/{1.73_m2} (ref >=60)
GFR, Est Non African American: 65 mL/min/{1.73_m2} (ref >=60)
Globulin: 2.5 g/dL (calc) (ref 1.9–3.7)
Glucose, Bld: 150 mg/dL — ABNORMAL HIGH (ref 65–99)
Potassium: 4 mmol/L (ref 3.5–5.3)
Sodium: 136 mmol/L (ref 135–146)
Total Bilirubin: 0.3 mg/dL (ref 0.2–1.2)
Total Protein: 6.6 g/dL (ref 6.1–8.1)

## 2019-08-18 LAB — CBC WITH DIFFERENTIAL/PLATELET
Absolute Monocytes: 986 cells/uL — ABNORMAL HIGH (ref 200–950)
Basophils Absolute: 127 cells/uL (ref 0–200)
Basophils Relative: 0.8 %
Eosinophils Absolute: 334 cells/uL (ref 15–500)
Eosinophils Relative: 2.1 %
HCT: 39.5 % (ref 35.0–45.0)
Hemoglobin: 13.1 g/dL (ref 11.7–15.5)
Lymphs Abs: 1193 cells/uL (ref 850–3900)
MCH: 31.7 pg (ref 27.0–33.0)
MCHC: 33.2 g/dL (ref 32.0–36.0)
MCV: 95.6 fL (ref 80.0–100.0)
MPV: 11.4 fL (ref 7.5–12.5)
Monocytes Relative: 6.2 %
Neutro Abs: 13261 cells/uL — ABNORMAL HIGH (ref 1500–7800)
Neutrophils Relative %: 83.4 %
Platelets: 272 10*3/uL (ref 140–400)
RBC: 4.13 10*6/uL (ref 3.80–5.10)
RDW: 12.5 % (ref 11.0–15.0)
Total Lymphocyte: 7.5 %
WBC: 15.9 10*3/uL — ABNORMAL HIGH (ref 3.8–10.8)

## 2019-08-18 NOTE — Patient Instructions (Signed)
Hip Bursitis Rehab Ask your health care provider which exercises are safe for you. Do exercises exactly as told by your health care provider and adjust them as directed. It is normal to feel mild stretching, pulling, tightness, or discomfort as you do these exercises. Stop right away if you feel sudden pain or your pain gets worse. Do not begin these exercises until told by your health care provider. Stretching exercise This exercise warms up your muscles and joints and improves the movement and flexibility of your hip. This exercise also helps to relieve pain and stiffness. Iliotibial band stretch An iliotibial band is a strong band of muscle tissue that runs from the outer side of your hip to the outer side of your thigh and knee. 1. Lie on your side with your left / right leg in the top position. 2. Bend your left / right knee and grab your ankle. Stretch out your bottom arm to help you balance. 3. Slowly bring your knee back so your thigh is behind your body. 4. Slowly lower your knee toward the floor until you feel a gentle stretch on the outside of your left / right thigh. If you do not feel a stretch and your knee will not fall farther, place the heel of your other foot on top of your knee and pull your knee down toward the floor with your foot. 5. Hold this position for __________ seconds. 6. Slowly return to the starting position. Repeat __________ times. Complete this exercise __________ times a day. Strengthening exercises These exercises build strength and endurance in your hip and pelvis. Endurance is the ability to use your muscles for a long time, even after they get tired. Bridge This exercise strengthens the muscles that move your thigh backward (hip extensors). 1. Lie on your back on a firm surface with your knees bent and your feet flat on the floor. 2. Tighten your buttocks muscles and lift your buttocks off the floor until your trunk is level with your thighs. ? Do not arch  your back. ? You should feel the muscles working in your buttocks and the back of your thighs. If you do not feel these muscles, slide your feet 1-2 inches (2.5-5 cm) farther away from your buttocks. ? If this exercise is too easy, try doing it with your arms crossed over your chest. 3. Hold this position for __________ seconds. 4. Slowly lower your hips to the starting position. 5. Let your muscles relax completely after each repetition. Repeat __________ times. Complete this exercise __________ times a day. Squats This exercise strengthens the muscles in front of your thigh and knee (quadriceps). 1. Stand in front of a table, with your feet and knees pointing straight ahead. You may rest your hands on the table for balance but not for support. 2. Slowly bend your knees and lower your hips like you are going to sit in a chair. ? Keep your weight over your heels, not over your toes. ? Keep your lower legs upright so they are parallel with the table legs. ? Do not let your hips go lower than your knees. ? Do not bend lower than told by your health care provider. ? If your hip pain increases, do not bend as low. 3. Hold the squat position for __________ seconds. 4. Slowly push with your legs to return to standing. Do not use your hands to pull yourself to standing. Repeat __________ times. Complete this exercise __________ times a day. Hip hike 1. Stand   sideways on a bottom step. Stand on your left / right leg with your other foot unsupported next to the step. You can hold on to the railing or wall for balance if needed. 2. Keep your knees straight and your torso square. Then lift your left / right hip up toward the ceiling. 3. Hold this position for __________ seconds. 4. Slowly let your left / right hip lower toward the floor, past the starting position. Your foot should get closer to the floor. Do not lean or bend your knees. Repeat __________ times. Complete this exercise __________ times a  day. Single leg stand 1. Without shoes, stand near a railing or in a doorway. You may hold on to the railing or door frame as needed for balance. 2. Squeeze your left / right buttock muscles, then lift up your other foot. ? Do not let your left / right hip push out to the side. ? It is helpful to stand in front of a mirror for this exercise so you can watch your hip. 3. Hold this position for __________ seconds. Repeat __________ times. Complete this exercise __________ times a day. This information is not intended to replace advice given to you by your health care provider. Make sure you discuss any questions you have with your health care provider. Document Revised: 07/14/2018 Document Reviewed: 07/14/2018 Elsevier Patient Education  2020 [employer]  

## 2019-08-19 NOTE — Progress Notes (Signed)
WBC count is elevated-15.9. She is taking long-term prednisone.   ALT is elevated but trending down. AST WNL.  Please advise the patient to continue arava 10 mg 1 tablet every other day.

## 2019-08-21 DIAGNOSIS — L817 Pigmented purpuric dermatosis: Secondary | ICD-10-CM | POA: Diagnosis not present

## 2019-08-21 DIAGNOSIS — B353 Tinea pedis: Secondary | ICD-10-CM | POA: Diagnosis not present

## 2019-08-21 DIAGNOSIS — L821 Other seborrheic keratosis: Secondary | ICD-10-CM | POA: Diagnosis not present

## 2019-08-24 ENCOUNTER — Telehealth: Payer: Self-pay | Admitting: Rheumatology

## 2019-08-24 MED ORDER — COSENTYX SENSOREADY PEN 150 MG/ML ~~LOC~~ SOAJ: 150.0000 mg | SUBCUTANEOUS | 2 refills | Status: DC

## 2019-08-24 NOTE — Telephone Encounter (Signed)
Last Visit: 08/18/2019 Next Visit: 11/18/2019 Labs: 08/18/2019 WBC count is elevated-15.9. She is taking long-term prednisone.  ALT is elevated but trending down. AST WNL. Please advise the patient to continue arava 10 mg 1 tablet every other day.  TB Gold: 09/04/2018 negative   Okay to refill per Dr. Estanislado Pandy.

## 2019-08-24 NOTE — Telephone Encounter (Signed)
Patient called requesting prescription refill of Cosentyx to be sent to [facility].

## 2019-09-03 ENCOUNTER — Other Ambulatory Visit: Payer: Self-pay

## 2019-09-03 DIAGNOSIS — Z79899 Other long term (current) drug therapy: Secondary | ICD-10-CM

## 2019-09-04 NOTE — Progress Notes (Signed)
Labs are stable.  Glucose is elevated.  Please forward labs to PCP.

## 2019-09-05 LAB — COMPLETE METABOLIC PANEL WITH GFR
AG Ratio: 2 (calc) (ref 1.0–2.5)
ALT: 34 U/L — ABNORMAL HIGH (ref 6–29)
AST: 26 U/L (ref 10–35)
Albumin: 4.3 g/dL (ref 3.6–5.1)
Alkaline phosphatase (APISO): 53 U/L (ref 37–153)
BUN/Creatinine Ratio: 35 (calc) — ABNORMAL HIGH (ref 6–22)
BUN: 30 mg/dL — ABNORMAL HIGH (ref 7–25)
CO2: 18 mmol/L — ABNORMAL LOW (ref 20–32)
Calcium: 9.8 mg/dL (ref 8.6–10.4)
Chloride: 103 mmol/L (ref 98–110)
Creat: 0.86 mg/dL (ref 0.50–0.99)
GFR, Est African American: 80 mL/min/{1.73_m2} (ref >=60)
GFR, Est Non African American: 69 mL/min/{1.73_m2} (ref >=60)
Globulin: 2.2 g/dL (calc) (ref 1.9–3.7)
Glucose, Bld: 159 mg/dL — ABNORMAL HIGH (ref 65–99)
Potassium: 4.2 mmol/L (ref 3.5–5.3)
Sodium: 132 mmol/L — ABNORMAL LOW (ref 135–146)
Total Bilirubin: 0.3 mg/dL (ref 0.2–1.2)
Total Protein: 6.5 g/dL (ref 6.1–8.1)

## 2019-09-05 LAB — CBC WITH DIFFERENTIAL/PLATELET
Absolute Monocytes: 572 cells/uL (ref 200–950)
Basophils Absolute: 102 cells/uL (ref 0–200)
Basophils Relative: 0.8 %
Eosinophils Absolute: 114 cells/uL (ref 15–500)
Eosinophils Relative: 0.9 %
HCT: 38.6 % (ref 35.0–45.0)
Hemoglobin: 12.9 g/dL (ref 11.7–15.5)
Lymphs Abs: 940 cells/uL (ref 850–3900)
MCH: 32.3 pg (ref 27.0–33.0)
MCHC: 33.4 g/dL (ref 32.0–36.0)
MCV: 96.7 fL (ref 80.0–100.0)
MPV: 11.4 fL (ref 7.5–12.5)
Monocytes Relative: 4.5 %
Neutro Abs: 10973 cells/uL — ABNORMAL HIGH (ref 1500–7800)
Neutrophils Relative %: 86.4 %
Platelets: 264 10*3/uL (ref 140–400)
RBC: 3.99 10*6/uL (ref 3.80–5.10)
RDW: 12.7 % (ref 11.0–15.0)
Total Lymphocyte: 7.4 %
WBC: 12.7 10*3/uL — ABNORMAL HIGH (ref 3.8–10.8)

## 2019-09-05 LAB — QUANTIFERON-TB GOLD PLUS
Mitogen-NIL: >10 IU/mL
NIL: 0.02 IU/mL
QuantiFERON-TB Gold Plus: NEGATIVE
TB1-NIL: 0 IU/mL
TB2-NIL: 0 IU/mL

## 2019-09-07 NOTE — Progress Notes (Signed)
TB gold negative

## 2019-09-08 ENCOUNTER — Encounter: Payer: Self-pay | Admitting: Gastroenterology

## 2019-09-08 DIAGNOSIS — M810 Age-related osteoporosis without current pathological fracture: Secondary | ICD-10-CM | POA: Diagnosis not present

## 2019-09-16 ENCOUNTER — Other Ambulatory Visit: Payer: Self-pay

## 2019-09-16 DIAGNOSIS — Z79899 Other long term (current) drug therapy: Secondary | ICD-10-CM

## 2019-09-16 DIAGNOSIS — B353 Tinea pedis: Secondary | ICD-10-CM | POA: Diagnosis not present

## 2019-09-16 DIAGNOSIS — I781 Nevus, non-neoplastic: Secondary | ICD-10-CM | POA: Diagnosis not present

## 2019-09-17 ENCOUNTER — Telehealth: Payer: Self-pay | Admitting: *Deleted

## 2019-09-17 ENCOUNTER — Other Ambulatory Visit (HOSPITAL_COMMUNITY): Payer: Self-pay | Admitting: Obstetrics & Gynecology

## 2019-09-17 ENCOUNTER — Telehealth: Payer: Self-pay | Admitting: Rheumatology

## 2019-09-17 ENCOUNTER — Other Ambulatory Visit: Payer: Self-pay | Admitting: Rheumatology

## 2019-09-17 DIAGNOSIS — Z79899 Other long term (current) drug therapy: Secondary | ICD-10-CM

## 2019-09-17 DIAGNOSIS — Z1231 Encounter for screening mammogram for malignant neoplasm of breast: Secondary | ICD-10-CM

## 2019-09-17 LAB — CBC WITH DIFFERENTIAL/PLATELET
Absolute Monocytes: 616 cells/uL (ref 200–950)
Basophils Absolute: 84 cells/uL (ref 0–200)
Basophils Relative: 0.6 %
Eosinophils Absolute: 84 cells/uL (ref 15–500)
Eosinophils Relative: 0.6 %
HCT: 39 % (ref 35.0–45.0)
Hemoglobin: 13.1 g/dL (ref 11.7–15.5)
Lymphs Abs: 1260 cells/uL (ref 850–3900)
MCH: 31.7 pg (ref 27.0–33.0)
MCHC: 33.6 g/dL (ref 32.0–36.0)
MCV: 94.4 fL (ref 80.0–100.0)
MPV: 11.5 fL (ref 7.5–12.5)
Monocytes Relative: 4.4 %
Neutro Abs: 11956 cells/uL — ABNORMAL HIGH (ref 1500–7800)
Neutrophils Relative %: 85.4 %
Platelets: 240 10*3/uL (ref 140–400)
RBC: 4.13 10*6/uL (ref 3.80–5.10)
RDW: 12.7 % (ref 11.0–15.0)
Total Lymphocyte: 9 %
WBC: 14 10*3/uL — ABNORMAL HIGH (ref 3.8–10.8)

## 2019-09-17 LAB — COMPLETE METABOLIC PANEL WITH GFR
AG Ratio: 1.8 (calc) (ref 1.0–2.5)
ALT: 34 U/L — ABNORMAL HIGH (ref 6–29)
AST: 24 U/L (ref 10–35)
Albumin: 4.2 g/dL (ref 3.6–5.1)
Alkaline phosphatase (APISO): 50 U/L (ref 37–153)
BUN: 24 mg/dL (ref 7–25)
CO2: 20 mmol/L (ref 20–32)
Calcium: 9.9 mg/dL (ref 8.6–10.4)
Chloride: 102 mmol/L (ref 98–110)
Creat: 0.9 mg/dL (ref 0.50–0.99)
GFR, Est African American: 76 mL/min/{1.73_m2} (ref >=60)
GFR, Est Non African American: 65 mL/min/{1.73_m2} (ref >=60)
Globulin: 2.4 g/dL (calc) (ref 1.9–3.7)
Glucose, Bld: 143 mg/dL — ABNORMAL HIGH (ref 65–99)
Potassium: 4.8 mmol/L (ref 3.5–5.3)
Sodium: 133 mmol/L — ABNORMAL LOW (ref 135–146)
Total Bilirubin: 0.2 mg/dL (ref 0.2–1.2)
Total Protein: 6.6 g/dL (ref 6.1–8.1)

## 2019-09-17 MED ORDER — HYDROCODONE-ACETAMINOPHEN 5-325 MG PO TABS: ORAL_TABLET | ORAL | 0 refills | Status: DC

## 2019-09-17 NOTE — Telephone Encounter (Signed)
Last Visit: 08/18/2019 Next Visit: 11/18/2019 UDS:07/13/2019 Narc Agreement: 07/13/2019  Last Fill: 08/07/2019  Okay to refill Hydrocodone?

## 2019-09-17 NOTE — Telephone Encounter (Signed)
-----   Message from Bo Merino, MD sent at 09/17/2019 12:50 PM EDT ----- Her LFTs are still mildly elevated. She can try increasing leflunomide 10 mg alternating with 20 mg every other day. She should get LFTs and 2 weeks.

## 2019-09-17 NOTE — Progress Notes (Signed)
CBC and CMP are stable.  White cell count and glucose are elevated due to chronic prednisone use.

## 2019-09-17 NOTE — Telephone Encounter (Signed)
Last Visit: 08/18/2019 Next Visit: 11/18/2019  Current Dose per office note on 08/18/2019: prednisone 5 mg po daily  Okay to refill per Dr. Estanislado Pandy

## 2019-09-17 NOTE — Telephone Encounter (Signed)
Patient called requesting prescription refill of Hydrocodone to be sent to [facility] in [place]. 

## 2019-09-17 NOTE — Progress Notes (Signed)
Her LFTs are still mildly elevated. She can try increasing leflunomide 10 mg alternating with 20 mg every other day. She should get LFTs and 2 weeks.

## 2019-10-07 ENCOUNTER — Ambulatory Visit (HOSPITAL_COMMUNITY): Payer: PPO

## 2019-10-12 NOTE — Progress Notes (Signed)
Office Visit Note  Patient: Tonya Hall             Date of Birth: 12-Mar-1951           MRN: [id number]             PCP: [name], MD Referring: [name], MD Visit Date: 10/13/2019 Occupation: @GUAROCC @  Subjective:  Right shoulder joint pain   History of Present Illness: Tonya Hall is a 69 y.o. female with history of psoriatic arthritis, osteoarthritis, and DDD.  She is taking Cosentyx 150 mg tablets injections every 14 days, Otezla 30 mg twice daily, and Arava 10 mg alternating with 20 mg every other day, and prednisone 5 mg by mouth daily.  She reports that she continues to have increased pain and stiffness since having to reduce the dose of Arava due to elevated LFTs.  She continues to take hydrocodone as needed for pain relief.  She states that she cannot perform her ADLs without taking hydrocodone.  She presents today with increased pain in the right shoulder joint.  She is also been having severe plantar fasciitis of the left foot and would like a cortisone injection today.  She states that she is been having difficulty ambulating due to the severity of her pain.  She continues to have intermittent SI joint pain especially in the left SI joint.  She is also been having increased fatigue recently. According to the patient her PCP checks her vitamin D level.  She reports that she was also switched from Reclast to Prolia since she has had several " silent fractures" including rib fracture and pelvic fracture.   Activities of Daily Living:  Patient reports morning stiffness for 4-5  hours.   Patient Reports nocturnal pain.  Difficulty dressing/grooming: Reports Difficulty climbing stairs: Reports Difficulty getting out of chair: Reports Difficulty using hands for taps, buttons, cutlery, and/or writing: Reports  Review of Systems  Constitutional: Positive for fatigue.  HENT: Positive for mouth dryness and nose dryness. Negative for mouth sores.   Eyes: Positive for  dryness. Negative for pain and visual disturbance.  Respiratory: Positive for shortness of breath. Negative for cough, hemoptysis and difficulty breathing.   Cardiovascular: Negative for chest pain, palpitations, hypertension and swelling in legs/feet.  Gastrointestinal: Negative for blood in stool, constipation and diarrhea.  Endocrine: Negative for increased urination.  Genitourinary: Negative for difficulty urinating and painful urination.  Musculoskeletal: Positive for arthralgias, joint pain, joint swelling, myalgias, morning stiffness, muscle tenderness and myalgias. Negative for muscle weakness.  Skin: Positive for color change. Negative for pallor, rash, hair loss, nodules/bumps, redness, skin tightness, ulcers and sensitivity to sunlight.  Allergic/Immunologic: Negative for susceptible to infections.  Neurological: Positive for numbness and headaches. Negative for dizziness, memory loss and weakness.  Hematological: Positive for bruising/bleeding tendency. Negative for swollen glands.  Psychiatric/Behavioral: Negative for depressed mood, confusion and sleep disturbance. The patient is not nervous/anxious.     PMFS History:  Patient Active Problem List   Diagnosis Date Noted  . AKI (acute kidney injury) ([place]) 02/07/2019  . Acute respiratory failure with hypoxia ([place]) 02/06/2019  . Acute respiratory disease due to COVID-19 virus 02/06/2019  . Community acquired pneumonia 02/06/2019  . Hyponatremia 02/06/2019  . Asthma   . Diabetes mellitus without complication ([place])   . Closed nondisplaced odontoid fracture with type II morphology ([place]) 01/27/2018  . Odontoid fracture ([place]) 01/24/2018  . Chest pain 11/14/2017  . Transaminitis 06/22/2016  . Collagenous colitis 05/23/2016  .  Psoriasis 05/21/2016  . High risk medication use 02/15/2016  . Age-related osteoporosis without current pathological fracture 02/15/2016  . Trochanteric bursitis, left hip 02/15/2016  . Sacroiliitis, not  elsewhere classified ([place]) 02/15/2016  . Chronic pain syndrome 02/15/2016  . Abnormal weight gain 06/25/2012  . Hypertension 07/03/2011  . Hypercholesteremia 07/03/2011  . Psoriatic arthritis ([place]) 07/03/2011  . Osteoarthritis of multiple joints 07/03/2011  . Esophageal motility disorder 02/14/2011  . Cough 02/14/2011  . GERD 05/05/2010    Past Medical History:  Diagnosis Date  . Asthma    Albuterol in haler prn  . Cataract    immature unsure which eye  . Chronic back pain   . COVID-19 October/November 2020  . Degenerative disk disease    psoriatic  . Diabetes mellitus without complication (HCC)    diet controlled  . Esophageal motility disorder    Non-specific, see modified barium study/speech path, BP  . GERD (gastroesophageal reflux disease)    takes Protonix bid  . History of blood transfusion    post c-section  . History of bronchitis   . HTN (hypertension)   . Hyperlipidemia    takes Pravastatin daily  . Lymphocytic colitis 05/26/2010   Responded to Entocort x 3 MOS  . Neuropathy   . Nonallergic rhinitis   . Osteoporosis    gets Boniva every 3 months  . Peripheral edema    takes HCTZ daily  . PONV (postoperative nausea and vomiting)   . Psoriatic arthritis ([place])    Dr. Katherina Right  . Raynaud's disease   . Seasonal allergies   . Shingles 2020  . SUI (stress urinary incontinence, female)   . Urinary urgency     Family History  Problem Relation Age of Onset  . Diabetes Mother   . Pancreatitis Mother   . Arthritis Mother        psoriatic   . Fibromyalgia Mother   . Rheumatic fever Father   . Diabetes Other        grandparents  . Skin cancer Other        grandfather  . Hypertension Other        grandparent  . Congestive Heart Failure Other        grandfather  . Parkinson's disease Other        grandmother  . Colon cancer Maternal Uncle   . Fibromyalgia Sister   . Rheum arthritis Sister   . Diabetes Sister   . Fibromyalgia Sister   . Diabetes  Sister   . Rheum arthritis Sister   . Hypertension Son   . Colon polyps Neg Hx    Past Surgical History:  Procedure Laterality Date  . BIOPSY  04/17/2016   Procedure: BIOPSY;  Surgeon: Danie Binder, MD;  [facility];  Service: Endoscopy;;  random colon bx's  . CERVICAL SPINE SURGERY  09/09/2017  . CESAREAN SECTION  1974, 1978      . COLONOSCOPY  06/19/2008   KVQ:[id number] internal hemorrhoids/mild sigmoin colon diverticulosis  . COLONOSCOPY  2012   Dr. Gala Romney: normal rectum, diverticula, lymphocytic colitis   . COLONOSCOPY WITH PROPOFOL N/A 04/17/2016   Procedure: COLONOSCOPY WITH PROPOFOL;  Surgeon: Danie Binder, MD;  [facility];  Service: Endoscopy;  Laterality: N/A;  900  . DILATION AND CURETTAGE OF UTERUS  1977   spontaneous abortion  . ESOPHAGOGASTRODUODENOSCOPY  06/19/2008   LOV:FIEPPIRJ gastritis  . LUMBAR DISC ARTHROPLASTY  7/98  . LUMBAR FUSION  6/03, 11/08, 11/14  L4-5 fusion, L2-3 fusion, L1-2  . ODONTOID SCREW INSERTION N/A 01/27/2018   Procedure: ODONTOID SCREW INSERTION;  Surgeon: Newman Pies, MD;  Location: [facility];  Service: Neurosurgery;  Laterality: N/A;  ODONTOID SCREW INSERTION  . TONSILLECTOMY AND ADENOIDECTOMY    . TUBAL LIGATION     Social History   Social History Narrative   ATTENDS [employer]. 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   Immunization History  Administered Date(s) Administered  . Influenza,inj,Quad PF,6+ Mos 01/21/2017  . Influenza-Unspecified 01/30/2017  . Pneumococcal Polysaccharide-23 02/19/2013  . Tdap 09/10/2016     Objective: Vital Signs: BP (!) 157/81 (BP Location: Left Arm, Patient Position: Sitting, Cuff Size: Normal)   Pulse 90   Resp 16   Ht 4\' 11"  (1.499 m)   Wt 142 lb (64.4 kg)   LMP 02/01/1999   BMI 28.68 kg/m    Physical Exam Vitals and nursing note reviewed.  Constitutional:      Appearance: She is well-developed.  HENT:     Head: Normocephalic and atraumatic.  Eyes:      Conjunctiva/sclera: Conjunctivae normal.  Pulmonary:     Effort: Pulmonary effort is normal.  Abdominal:     General: Bowel sounds are normal.     Palpations: Abdomen is soft.  Musculoskeletal:     Cervical back: Normal range of motion.  Lymphadenopathy:     Cervical: No cervical adenopathy.  Skin:    General: Skin is warm and dry.     Capillary Refill: Capillary refill takes less than 2 seconds.  Neurological:     Mental Status: She is alert and oriented to person, place, and time.  Psychiatric:        Behavior: Behavior normal.      Musculoskeletal Exam: C-spine is limited range of motion with lateral rotation.  Thoracic kyphosis noted.  No midline spinal tenderness.  She has tenderness over the left SI joint.  Right shoulder has full abduction but internal rotation was limited.  Left shoulder has full range of motion with no discomfort.  Elbow joints have good range of motion with no tenderness or inflammation.  Wrist joints have good range of motion with no tenderness or inflammation.  She has severe PIP and DIP thickening.  She has tenderness of all MCP joints but no synovitis was noted.  She has tenderness and synovitis of the right third PIP joint and left second PIP joint.  She has about 95% fist formation bilaterally.  Knee joints have good range of motion with no warmth or effusion.  Ankle joints have good range of motion no tenderness or inflammation.  She has tenderness on the plantar aspect of the left foot.  Tenderness over the left trochanter bursa noted  CDAI Exam: CDAI Score: - Patient Global: -; Provider Global: - Swollen: 2 ; Tender: 13  Joint Exam 10/13/2019      Right  Left  Glenohumeral   Tender     MCP 1   Tender   Tender  MCP 2   Tender   Tender  MCP 3   Tender   Tender  MCP 4   Tender   Tender  MCP 5   Tender   Tender  PIP 2     Swollen Tender  PIP 3  Swollen Tender        Investigation: No additional findings.  Imaging: No results found.  Recent  Labs: Lab Results  Component Value Date   WBC 14.0 (H) 09/16/2019   HGB 13.1 09/16/2019  PLT 240 09/16/2019   NA 133 (L) 09/16/2019   K 4.8 09/16/2019   CL 102 09/16/2019   CO2 20 09/16/2019   GLUCOSE 143 (H) 09/16/2019   BUN 24 09/16/2019   CREATININE 0.90 09/16/2019   BILITOT 0.2 09/16/2019   ALKPHOS 56 02/10/2019   AST 24 09/16/2019   ALT 34 (H) 09/16/2019   PROT 6.6 09/16/2019   ALBUMIN 2.7 (L) 02/10/2019   CALCIUM 9.9 09/16/2019   GFRAA 76 09/16/2019   QFTBGOLD Negative 05/05/2015   QFTBGOLDPLUS NEGATIVE 09/03/2019    Speciality Comments: No specialty comments available.  Procedures:  Foot Inj  Date/Time: 10/13/2019 9:34 AM Performed by: Bo Merino, MD Authorized by: Bo Merino, MD   Consent Given by:  Patient Site marked: the procedure site was marked   Timeout: prior to procedure the correct patient, procedure, and site was verified   Indications:  Fasciitis Condition: Plantar Fasciitis   Location: left plantar fascia muscle   Prep: patient was prepped and draped in usual sterile fashion   Needle Size:  27 G Approach:  Plantar Medications:  0.5 mL lidocaine 1 %; 20 mg triamcinolone acetonide 40 MG/ML Patient Tolerance:  Patient tolerated the procedure well with no immediate complications   Allergies: Adalimumab, Imuran [azathioprine sodium], Methotrexate, Plaquenil [hydroxychloroquine sulfate], Sulfonamide derivatives, Ceftin [cefuroxime axetil], and Morphine   Assessment / Plan:     Visit Diagnoses: Psoriatic arthritis ([place]): She has ongoing tenderness and synovitis of the right third PIP and left second DIP joint.  She has tenderness of all MCP joints but no synovitis was noted.  She has about 95% fist formation bilaterally.  She presents today with severe discomfort in the right shoulder joint.  She has full abduction of the right shoulder but limited and painful internal rotation.  Her left shoulder has full range of motion with no  discomfort.  She is also been experiencing severe plantar fasciitis of the left foot and has had difficulty ambulating.  She requested a cortisone injection today.  She tolerated procedure well.  The procedure note was completed above.  She has been taking hydrocodone 1 tablet 2-3 times daily for pain relief.  According to the patient she is unable to perform ADLs if she does not take hydrocodone.  She is currently on Cosentyx 150 mg subcu injections every 14 days, Arava 10 mg 1 tablet alternating with 2 tablets every other day, Otezla 30 mg twice daily, and prednisone 5 mg daily.  She would like to increase the dose of Arava to 20 mg by mouth daily but she has had a recent elevation in LFTs.  That if she cuts back on hydrocodone use we may be able to increase the dose of Arava.  We will recheck LFTs today.  She will continue on the current treatment regimen for now.  She will follow-up next week for a right shoulder cortisone injection.  She was advised to monitor her blood pressure closely.  She will follow-up for a routine follow-up in 3 months.  Psoriasis: She has no active psoriasis.   High risk medication use -  Cosentyx 150 mg sq injections every 14 days, Arava 10 mg 1 tablet alternating with 2 tablets every other day (reduced dose due to elevated LFTs), Otezla 30 mg BID (started on 07/16/19).  CBC and CMP were drawn on 09/16/2019.  ALT was 34 and AST was 24 at that time.  We will recheck LFTs today.- Plan: AST, ALT  Elevated LFTs -She takes hydrocodone 1  tablet by mouth 2-3 times daily for pain relief.  She is currently taking Arava 10 mg 1 tablet alternating with 2 tablets every other day and has had to reduce the dose due to elevated LFTs.  We discussed that if she cuts back on hydrocodone use we may be able to increase the dose of Gravol if her LFTs returned to within normal limits.  According to the patient she cannot perform ADLs if she does not take hydrocodone.  ALT was 34 and AST was 24 on  09/16/2019.  We will recheck LFTs today.  Orders were released.  Plan: AST, ALT  Chronic left SI joint pain: She has chronic SI joint pain.  She has tenderness to palpation on exam today.  She was encouraged to perform stretching exercises on a daily basis.  Primary osteoarthritis of right knee: She has good range of motion of the right knee joint on exam.  No warmth or effusion was noted.  She has noticed a significant improvement in her right knee joint pain since having Visco gel injections in April 2021.  Raynaud's syndrome without gangrene: Not currently active.  No digital ulcerations noted.  Trochanteric bursitis, left hip: She has tenderness of outpatient over the left trochanteric bursa.  She has difficulty laying on her left side at night.  Morton's neuroma of both feet: We discussed the importance of wearing proper fitting shoes.  DDD (degenerative disc disease), cervical: She has limited lateral rotation bilaterally.  She is not experiencing any symptoms of radiculopathy at this time.  She does experience trapezius muscle tension and muscle tenderness and has been going to massage therapy on a regular basis which has been helpful.  DDD (degenerative disc disease), thoracic: Kyphosis was noted.  No midline spinal tenderness was noted on exam today.  Age-related osteoporosis without current pathological fracture: According to the patient her PCP orders her bone density.  She was recently switched from Reclast to Prolia due to having several fragility fractures while on Reclast.  She also continues to take a calcium and vitamin D supplement.  Her PCP checks her vitamin D levels.  Chronic pain syndrome: She takes hydrocodone 1 tablet by mouth 2-3 times daily for pain relief.  UDS was updated on 07/27/2019.  Other medical conditions are listed as follows:  Senile purpura ([place])  Essential hypertension  History of gastroesophageal reflux (GERD)  Collagenous colitis  Esophageal  motility disorder  History of type 2 diabetes mellitus    Orders: Orders Placed This Encounter  Procedures  . Foot Inj  . AST  . ALT   No orders of the defined types were placed in this encounter.   Face-to-face time spent with patient was 30 minutes. Greater than 50% of time was spent in counseling and coordination of care.  Follow-Up Instructions: Return in 3 months (on 01/13/2020) for Psoriatic arthritis, Osteoarthritis.   Ofilia Neas, PA-C  Note - This record has been created using Dragon software.  Chart creation errors have been sought, but may not always  have been located. Such creation errors do not reflect on  the standard of medical care.

## 2019-10-13 ENCOUNTER — Ambulatory Visit: Payer: PPO | Admitting: Physician Assistant

## 2019-10-13 ENCOUNTER — Other Ambulatory Visit: Payer: Self-pay

## 2019-10-13 ENCOUNTER — Encounter: Payer: Self-pay | Admitting: Physician Assistant

## 2019-10-13 VITALS — BP 157/81 | HR 90 | Resp 16 | Ht 59.0 in | Wt 142.0 lb

## 2019-10-13 DIAGNOSIS — Z79899 Other long term (current) drug therapy: Secondary | ICD-10-CM | POA: Diagnosis not present

## 2019-10-13 DIAGNOSIS — G5763 Lesion of plantar nerve, bilateral lower limbs: Secondary | ICD-10-CM | POA: Diagnosis not present

## 2019-10-13 DIAGNOSIS — R7989 Other specified abnormal findings of blood chemistry: Secondary | ICD-10-CM

## 2019-10-13 DIAGNOSIS — I7300 Raynaud's syndrome without gangrene: Secondary | ICD-10-CM | POA: Diagnosis not present

## 2019-10-13 DIAGNOSIS — G894 Chronic pain syndrome: Secondary | ICD-10-CM | POA: Diagnosis not present

## 2019-10-13 DIAGNOSIS — M5030 Other cervical disc degeneration, unspecified cervical region: Secondary | ICD-10-CM | POA: Diagnosis not present

## 2019-10-13 DIAGNOSIS — M7062 Trochanteric bursitis, left hip: Secondary | ICD-10-CM | POA: Diagnosis not present

## 2019-10-13 DIAGNOSIS — K224 Dyskinesia of esophagus: Secondary | ICD-10-CM

## 2019-10-13 DIAGNOSIS — I10 Essential (primary) hypertension: Secondary | ICD-10-CM

## 2019-10-13 DIAGNOSIS — M533 Sacrococcygeal disorders, not elsewhere classified: Secondary | ICD-10-CM | POA: Diagnosis not present

## 2019-10-13 DIAGNOSIS — M5134 Other intervertebral disc degeneration, thoracic region: Secondary | ICD-10-CM | POA: Diagnosis not present

## 2019-10-13 DIAGNOSIS — M1711 Unilateral primary osteoarthritis, right knee: Secondary | ICD-10-CM | POA: Diagnosis not present

## 2019-10-13 DIAGNOSIS — M810 Age-related osteoporosis without current pathological fracture: Secondary | ICD-10-CM

## 2019-10-13 DIAGNOSIS — D692 Other nonthrombocytopenic purpura: Secondary | ICD-10-CM

## 2019-10-13 DIAGNOSIS — L4050 Arthropathic psoriasis, unspecified: Secondary | ICD-10-CM | POA: Diagnosis not present

## 2019-10-13 DIAGNOSIS — K52831 Collagenous colitis: Secondary | ICD-10-CM

## 2019-10-13 DIAGNOSIS — Z8719 Personal history of other diseases of the digestive system: Secondary | ICD-10-CM

## 2019-10-13 DIAGNOSIS — M722 Plantar fascial fibromatosis: Secondary | ICD-10-CM

## 2019-10-13 DIAGNOSIS — L409 Psoriasis, unspecified: Secondary | ICD-10-CM

## 2019-10-13 DIAGNOSIS — Z8639 Personal history of other endocrine, nutritional and metabolic disease: Secondary | ICD-10-CM

## 2019-10-13 DIAGNOSIS — G8929 Other chronic pain: Secondary | ICD-10-CM

## 2019-10-13 MED ORDER — LIDOCAINE HCL 1 % IJ SOLN
0.5000 mL | INTRAMUSCULAR | Status: AC | PRN
  Administered 2019-10-13: .5 mL

## 2019-10-13 MED ORDER — TRIAMCINOLONE ACETONIDE 40 MG/ML IJ SUSP
20.0000 mg | INTRAMUSCULAR | Status: AC | PRN
  Administered 2019-10-13: 20 mg

## 2019-10-14 LAB — ALT: ALT: 32 U/L — ABNORMAL HIGH (ref 6–29)

## 2019-10-14 LAB — AST: AST: 23 U/L (ref 10–35)

## 2019-10-14 NOTE — Progress Notes (Signed)
Glucose is mildly elevated.  ALT is mildly elevated.  CBC is a stable.

## 2019-10-14 NOTE — Progress Notes (Signed)
Ok to add thyroid function panel.

## 2019-10-14 NOTE — Progress Notes (Signed)
AST is WNL.  ALT is borderline elevated and has trended down slightly.  She can increase the dose of Arava to 20 mg 1 tablet by mouth daily, but she will need to return for lab work in 3 weeks.  Please advise the patient to only take hydrocodone as needed

## 2019-10-16 LAB — CBC WITH DIFFERENTIAL/PLATELET
Absolute Monocytes: 975 cells/uL — ABNORMAL HIGH (ref 200–950)
Basophils Absolute: 104 cells/uL (ref 0–200)
Basophils Relative: 0.8 %
Eosinophils Absolute: 312 cells/uL (ref 15–500)
Eosinophils Relative: 2.4 %
HCT: 40.8 % (ref 35.0–45.0)
Hemoglobin: 13.6 g/dL (ref 11.7–15.5)
Lymphs Abs: 1469 cells/uL (ref 850–3900)
MCH: 31.3 pg (ref 27.0–33.0)
MCHC: 33.3 g/dL (ref 32.0–36.0)
MCV: 93.8 fL (ref 80.0–100.0)
MPV: 11.2 fL (ref 7.5–12.5)
Monocytes Relative: 7.5 %
Neutro Abs: 10140 cells/uL — ABNORMAL HIGH (ref 1500–7800)
Neutrophils Relative %: 78 %
Platelets: 264 10*3/uL (ref 140–400)
RBC: 4.35 10*6/uL (ref 3.80–5.10)
RDW: 12.4 % (ref 11.0–15.0)
Total Lymphocyte: 11.3 %
WBC: 13 10*3/uL — ABNORMAL HIGH (ref 3.8–10.8)

## 2019-10-16 LAB — THYROID PANEL WITH TSH
Free Thyroxine Index: 1.9 (ref 1.4–3.8)
T3 Uptake: 30 % (ref 22–35)
T4, Total: 6.3 ug/dL (ref 5.1–11.9)
TSH: 0.99 mIU/L (ref 0.40–4.50)

## 2019-10-16 LAB — COMPLETE METABOLIC PANEL WITH GFR
AG Ratio: 1.6 (calc) (ref 1.0–2.5)
ALT: 31 U/L — ABNORMAL HIGH (ref 6–29)
AST: 24 U/L (ref 10–35)
Albumin: 4.1 g/dL (ref 3.6–5.1)
Alkaline phosphatase (APISO): 48 U/L (ref 37–153)
BUN: 24 mg/dL (ref 7–25)
CO2: 23 mmol/L (ref 20–32)
Calcium: 10 mg/dL (ref 8.6–10.4)
Chloride: 103 mmol/L (ref 98–110)
Creat: 0.78 mg/dL (ref 0.50–0.99)
GFR, Est African American: 90 mL/min/{1.73_m2} (ref >=60)
GFR, Est Non African American: 78 mL/min/{1.73_m2} (ref >=60)
Globulin: 2.5 g/dL (calc) (ref 1.9–3.7)
Glucose, Bld: 131 mg/dL — ABNORMAL HIGH (ref 65–99)
Potassium: 4.1 mmol/L (ref 3.5–5.3)
Sodium: 135 mmol/L (ref 135–146)
Total Bilirubin: 0.3 mg/dL (ref 0.2–1.2)
Total Protein: 6.6 g/dL (ref 6.1–8.1)

## 2019-10-16 LAB — TEST AUTHORIZATION

## 2019-10-17 NOTE — Progress Notes (Signed)
WBC count is elevated due to chronic prednisone use.  TSH is normal.

## 2019-10-21 ENCOUNTER — Other Ambulatory Visit: Payer: Self-pay

## 2019-10-21 ENCOUNTER — Ambulatory Visit: Payer: PPO | Admitting: Rheumatology

## 2019-10-21 VITALS — BP 127/76 | HR 90

## 2019-10-21 DIAGNOSIS — M25511 Pain in right shoulder: Secondary | ICD-10-CM

## 2019-10-21 DIAGNOSIS — G8929 Other chronic pain: Secondary | ICD-10-CM | POA: Diagnosis not present

## 2019-10-21 DIAGNOSIS — Z5181 Encounter for therapeutic drug level monitoring: Secondary | ICD-10-CM

## 2019-10-21 MED ORDER — TRIAMCINOLONE ACETONIDE 40 MG/ML IJ SUSP
40.0000 mg | INTRAMUSCULAR | Status: AC | PRN
  Administered 2019-10-21: 40 mg via INTRA_ARTICULAR

## 2019-10-21 MED ORDER — LIDOCAINE HCL 1 % IJ SOLN
1.5000 mL | INTRAMUSCULAR | Status: AC | PRN
  Administered 2019-10-21: 1.5 mL

## 2019-10-21 NOTE — Progress Notes (Signed)
   Procedure Note  Patient: Tonya Hall             Date of Birth: Oct 21, 1950           MRN: [id number]             Visit Date: 10/21/2019  Procedures: Visit Diagnoses:  1. Therapeutic drug monitoring   2. Encounter for chronic pain management   3. Chronic right shoulder pain     Large Joint Inj: R glenohumeral on 10/21/2019 12:37 PM Indications: pain Details: 27 G 1.5 in needle, posterior approach  Arthrogram: No  Medications: 40 mg triamcinolone acetonide 40 MG/ML; 1.5 mL lidocaine 1 % Aspirate: 0 mL Outcome: tolerated well, no immediate complications Procedure, treatment alternatives, risks and benefits explained, specific risks discussed. Consent was given by the patient. Immediately prior to procedure a time out was called to verify the correct patient, procedure, equipment, support staff and site/side marked as required. Patient was prepped and draped in the usual sterile fashion.    Postprocedure instructions were given.  Patient tolerated the procedure well.  She has been advised to monitor blood pressure and blood glucose levels closely. Bo Merino, MD

## 2019-10-23 LAB — DRUG MONITOR, PANEL 5, W/CONF, URINE
Amphetamines: NEGATIVE ng/mL (ref <500)
Barbiturates: NEGATIVE ng/mL (ref <300)
Benzodiazepines: NEGATIVE ng/mL (ref <100)
Cocaine Metabolite: NEGATIVE ng/mL (ref <150)
Codeine: NEGATIVE ng/mL (ref <50)
Creatinine: 30.4 mg/dL
Hydrocodone: 188 ng/mL — ABNORMAL HIGH (ref <50)
Hydromorphone: NEGATIVE ng/mL (ref <50)
Marijuana Metabolite: NEGATIVE ng/mL (ref <20)
Methadone Metabolite: NEGATIVE ng/mL (ref <100)
Morphine: NEGATIVE ng/mL (ref <50)
Norhydrocodone: 439 ng/mL — ABNORMAL HIGH (ref <50)
Opiates: POSITIVE ng/mL — AB (ref <100)
Oxidant: NEGATIVE ug/mL
Oxycodone: NEGATIVE ng/mL (ref <100)
pH: 7 (ref 4.5–9.0)

## 2019-10-23 LAB — DM TEMPLATE

## 2019-10-26 ENCOUNTER — Other Ambulatory Visit: Payer: Self-pay | Admitting: Rheumatology

## 2019-10-26 NOTE — Telephone Encounter (Signed)
Last Visit: 10/13/2019 Next Visit: 01/20/2020 Labs: 10/13/2019 Glucose is mildly elevated. ALT is mildly elevated. CBC is a stable.  Current Dose per lab note on 10/13/2019: She can increase the dose of Arava to 20 mg 1 tablet by mouth daily DX: Psoriatic arthritis   Okay to refill Arava?

## 2019-10-28 DIAGNOSIS — H1045 Other chronic allergic conjunctivitis: Secondary | ICD-10-CM | POA: Diagnosis not present

## 2019-10-28 DIAGNOSIS — R05 Cough: Secondary | ICD-10-CM | POA: Diagnosis not present

## 2019-10-28 DIAGNOSIS — J300 Vasomotor rhinitis: Secondary | ICD-10-CM | POA: Diagnosis not present

## 2019-11-02 ENCOUNTER — Other Ambulatory Visit: Payer: Self-pay | Admitting: Rheumatology

## 2019-11-02 MED ORDER — HYDROCODONE-ACETAMINOPHEN 5-325 MG PO TABS: ORAL_TABLET | ORAL | 0 refills | Status: DC

## 2019-11-02 NOTE — Telephone Encounter (Signed)
Patient called requesting prescription refill of Hydrocodone to be sent to [facility] in [place]. 

## 2019-11-02 NOTE — Telephone Encounter (Signed)
Last Visit: 10/21/2019 Next Visit: 01/15/2020 UDS:10/21/2019 Narc Agreement: 07/13/2019  Last Fill: 09/17/2019  Okay to refill Hydrocodone?

## 2019-11-03 ENCOUNTER — Other Ambulatory Visit: Payer: Self-pay

## 2019-11-03 DIAGNOSIS — Z79899 Other long term (current) drug therapy: Secondary | ICD-10-CM

## 2019-11-03 LAB — CBC WITH DIFFERENTIAL/PLATELET
Absolute Monocytes: 625 cells/uL (ref 200–950)
Basophils Absolute: 106 cells/uL (ref 0–200)
Basophils Relative: 0.8 %
Eosinophils Absolute: 80 cells/uL (ref 15–500)
Eosinophils Relative: 0.6 %
HCT: 40.2 % (ref 35.0–45.0)
Hemoglobin: 13.5 g/dL (ref 11.7–15.5)
Lymphs Abs: 1131 cells/uL (ref 850–3900)
MCH: 31.3 pg (ref 27.0–33.0)
MCHC: 33.6 g/dL (ref 32.0–36.0)
MCV: 93.1 fL (ref 80.0–100.0)
MPV: 11.3 fL (ref 7.5–12.5)
Monocytes Relative: 4.7 %
Neutro Abs: 11358 cells/uL — ABNORMAL HIGH (ref 1500–7800)
Neutrophils Relative %: 85.4 %
Platelets: 261 10*3/uL (ref 140–400)
RBC: 4.32 10*6/uL (ref 3.80–5.10)
RDW: 13 % (ref 11.0–15.0)
Total Lymphocyte: 8.5 %
WBC: 13.3 10*3/uL — ABNORMAL HIGH (ref 3.8–10.8)

## 2019-11-03 LAB — COMPLETE METABOLIC PANEL WITH GFR
AG Ratio: 1.9 (calc) (ref 1.0–2.5)
ALT: 28 U/L (ref 6–29)
AST: 24 U/L (ref 10–35)
Albumin: 4.3 g/dL (ref 3.6–5.1)
Alkaline phosphatase (APISO): 44 U/L (ref 37–153)
BUN: 25 mg/dL (ref 7–25)
CO2: 24 mmol/L (ref 20–32)
Calcium: 10.8 mg/dL — ABNORMAL HIGH (ref 8.6–10.4)
Chloride: 101 mmol/L (ref 98–110)
Creat: 0.9 mg/dL (ref 0.50–0.99)
GFR, Est African American: 76 mL/min/{1.73_m2} (ref >=60)
GFR, Est Non African American: 65 mL/min/{1.73_m2} (ref >=60)
Globulin: 2.3 g/dL (calc) (ref 1.9–3.7)
Glucose, Bld: 142 mg/dL — ABNORMAL HIGH (ref 65–99)
Potassium: 4.8 mmol/L (ref 3.5–5.3)
Sodium: 134 mmol/L — ABNORMAL LOW (ref 135–146)
Total Bilirubin: 0.3 mg/dL (ref 0.2–1.2)
Total Protein: 6.6 g/dL (ref 6.1–8.1)

## 2019-11-04 ENCOUNTER — Other Ambulatory Visit: Payer: Self-pay

## 2019-11-04 ENCOUNTER — Encounter: Payer: Self-pay | Admitting: Gastroenterology

## 2019-11-04 ENCOUNTER — Ambulatory Visit: Payer: PPO | Admitting: Gastroenterology

## 2019-11-04 VITALS — BP 133/79 | HR 98 | Temp 97.1°F | Ht 59.0 in | Wt 138.4 lb

## 2019-11-04 DIAGNOSIS — K219 Gastro-esophageal reflux disease without esophagitis: Secondary | ICD-10-CM

## 2019-11-04 DIAGNOSIS — K52831 Collagenous colitis: Secondary | ICD-10-CM

## 2019-11-04 DIAGNOSIS — K224 Dyskinesia of esophagus: Secondary | ICD-10-CM

## 2019-11-04 NOTE — Patient Instructions (Signed)
1. Try cutting back on omeprazole to once daily before a meal. If you have recurrent reflux, heartburn, abdominal pain you can go back to twice daily.  2. Try probiotic to see if helps with gas. Align, [employer] are good ones to start with. Take for four weeks. If you do not notice any difference, then try a different probiotic with different strain of bacteria. Call with any new or worsening symptoms.  3. Watch your diet for gas promoting foods, cut back on anything you are eating on regular basis that may cause gas. See handout.  4. Return to the office in one year.    Low-FODMAP Eating Plan  FODMAPs (fermentable oligosaccharides, disaccharides, monosaccharides, and polyols) are sugars that are hard for some people to digest. A low-FODMAP eating plan may help some people who have bowel (intestinal) diseases to manage their symptoms. This meal plan can be complicated to follow. Work with a diet and nutrition specialist (dietitian) to make a low-FODMAP eating plan that is right for you. A dietitian can make sure that you get enough nutrition from this diet. What are tips for following this plan? Reading food labels  Check labels for hidden FODMAPs such as: ? High-fructose syrup. ? Honey. ? Agave. ? Natural fruit flavors. ? Onion or garlic powder.  Choose low-FODMAP foods that contain 3-4 grams of fiber per serving.  Check food labels for serving sizes. Eat only one serving at a time to make sure FODMAP levels stay low. Meal planning  Follow a low-FODMAP eating plan for up to 6 weeks, or as told by your health care provider or dietitian.  To follow the eating plan: 1. Eliminate high-FODMAP foods from your diet completely. 2. Gradually reintroduce high-FODMAP foods into your diet one at a time. Most people should wait a few days after introducing one high-FODMAP food before they introduce the next high-FODMAP food. Your dietitian can recommend how quickly you may reintroduce  foods. 3. Keep a daily record of what you eat and drink, and make note of any symptoms that you have after eating. 4. Review your daily record with a dietitian regularly. Your dietitian can help you identify which foods you can eat and which foods you should avoid. General tips  Drink enough fluid each day to keep your urine pale yellow.  Avoid processed foods. These often have added sugar and may be high in FODMAPs.  Avoid most dairy products, whole grains, and sweeteners.  Work with a dietitian to make sure you get enough fiber in your diet. Recommended foods Grains  Gluten-free grains, such as rice, oats, buckwheat, quinoa, corn, polenta, and millet. Gluten-free pasta, bread, or cereal. Rice noodles. Corn tortillas. Vegetables  Eggplant, zucchini, cucumber, peppers, green beans, Brussels sprouts, bean sprouts, lettuce, arugula, kale, Swiss chard, spinach, collard greens, bok choy, summer squash, potato, and tomato. Limited amounts of corn, carrot, and sweet potato. Green parts of scallions. Fruits  Bananas, oranges, lemons, limes, blueberries, raspberries, strawberries, grapes, cantaloupe, honeydew melon, kiwi, papaya, passion fruit, and pineapple. Limited amounts of dried cranberries, banana chips, and shredded coconut. Dairy  Lactose-free milk, yogurt, and kefir. Lactose-free cottage cheese and ice cream. Non-dairy milks, such as almond, coconut, hemp, and rice milk. Yogurts made of non-dairy milks. Limited amounts of goat cheese, brie, mozzarella, parmesan, swiss, and other hard cheeses. Meats and other protein foods  Unseasoned beef, pork, poultry, or fish. Eggs. Berniece Salines. Tofu (firm) and tempeh. Limited amounts of nuts and seeds, such as almonds, walnuts, [place]  nuts, pecans, peanuts, pumpkin seeds, chia seeds, and sunflower seeds. Fats and oils  Butter-free spreads. Vegetable oils, such as olive, canola, and sunflower oil. Seasoning and other foods  Artificial sweeteners with  names that do not end in "ol" such as aspartame, saccharine, and stevia. Maple syrup, white table sugar, raw sugar, brown sugar, and molasses. Fresh basil, coriander, parsley, rosemary, and thyme. Beverages  Water and mineral water. Sugar-sweetened soft drinks. Small amounts of orange juice or cranberry juice. Black and green tea. Most dry wines. Coffee. This may not be a complete list of low-FODMAP foods. Talk with your dietitian for more information. Foods to avoid Grains  Wheat, including kamut, durum, and semolina. Barley and bulgur. Couscous. Wheat-based cereals. Wheat noodles, bread, crackers, and pastries. Vegetables  Chicory root, artichoke, asparagus, cabbage, snow peas, sugar snap peas, mushrooms, and cauliflower. Onions, garlic, leeks, and the white part of scallions. Fruits  Fresh, dried, and juiced forms of apple, pear, watermelon, peach, plum, cherries, apricots, blackberries, boysenberries, figs, nectarines, and mango. Avocado. Dairy  Milk, yogurt, ice cream, and soft cheese. Cream and sour cream. Milk-based sauces. Custard. Meats and other protein foods  Fried or fatty meat. Sausage. Cashews and pistachios. Soybeans, baked beans, black beans, chickpeas, kidney beans, fava beans, navy beans, lentils, and split peas. Seasoning and other foods  Any sugar-free gum or candy. Foods that contain artificial sweeteners such as sorbitol, mannitol, isomalt, or xylitol. Foods that contain honey, high-fructose corn syrup, or agave. Bouillon, vegetable stock, beef stock, and chicken stock. Garlic and onion powder. Condiments made with onion, such as hummus, chutney, pickles, relish, salad dressing, and salsa. Tomato paste. Beverages  Chicory-based drinks. Coffee substitutes. Chamomile tea. Fennel tea. Sweet or fortified wines such as port or sherry. Diet soft drinks made with isomalt, mannitol, maltitol, sorbitol, or xylitol. Apple, pear, and mango juice. Juices with high-fructose corn  syrup. This may not be a complete list of high-FODMAP foods. Talk with your dietitian to discuss what dietary choices are best for you.  Summary  A low-FODMAP eating plan is a short-term diet that eliminates FODMAPs from your diet to help ease symptoms of certain bowel diseases.  The eating plan usually lasts up to 6 weeks. After that, high-FODMAP foods are restarted gradually, one at a time, so you can find out which may be causing symptoms.  A low-FODMAP eating plan can be complicated. It is best to work with a dietitian who has experience with this type of plan. This information is not intended to replace advice given to you by your health care provider. Make sure you discuss any questions you have with your health care provider. Document Revised: 03/01/2017 Document Reviewed: 11/13/2016 Elsevier Patient Education  [place].

## 2019-11-04 NOTE — Progress Notes (Signed)
Primary Care Physician: [name], MD  Primary Gastroenterologist:  Formerly Barney Drain, MD   Chief Complaint  Patient presents with  . Gastroesophageal Reflux    doing okay   . collagenous colitis    f/u. BM's about twice a day    HPI: Tonya Hall is a 69 y.o. female here for follow-up.  Last seen in August 2020.  History of collagenous colitis.  History of GERD.  Originally diagnosed with lymphocytic colitis on colonoscopy in 2012.  Last colonoscopy in 2018, normal terminal ileum, hemorrhoids, random colon biopsy consistent with collagenous colitis. Low dose prednisone for years for psoriatic arthritis. Unable to be weaned off due to her adrenal insufficiency, followed by Dr. Soyla Murphy, endocrinology.   Doing well from a GI standpoint. Reflux is well controlled. Takes omeprazole twice daily. Denies abdominal pain. Bowel movements regular. No diarrhea. No melena or rectal bleeding. She has had some gas in the mornings which seems to be new. Wakes up at 5 AM and has a bowel movement. Not used to having these type symptoms. Denies any dietary changes or medication changes. Previously was on probiotics and she was having difficulty with diarrhea in the setting of collagenous colitis. Stop probiotics once the diarrhea resolved.  Has noted 2-3 episodes per month, coughing with meals, feels like she is aspirating again. Has been evaluated in the past, 2012. Barium pill esophagram July 2012 showed diffuse impairment of esophageal motility with incomplete clearance of barium by primary peristaltic waves. Numerous secondary tertiary waves noted. Modified barium swallow study July 2012 showed flash laryngeal penetration of liquid component with swallowing of pills, flash laryngeal penetration without aspiration or cough with thin barium by cup.     Current Outpatient Medications  Medication Sig Dispense Refill  . albuterol (PROVENTIL HFA;VENTOLIN HFA) 108 (90 BASE) MCG/ACT inhaler Inhale 2  puffs into the lungs every 6 (six) hours as needed for wheezing or shortness of breath.     . Apremilast (OTEZLA) 30 MG TABS Take 30 mg by mouth 2 (two) times daily.    Marland Kitchen aspirin 81 MG tablet Take 81 mg by mouth daily.      Marland Kitchen azelastine (ASTELIN) 0.1 % nasal spray     . BREO ELLIPTA 200-25 MCG/INH AEPB Inhale 1 puff into the lungs daily.    . Calcium Carb-Cholecalciferol (CALCIUM 600 + D PO) Take 1 tablet by mouth 2 (two) times daily.    . cetirizine (ZYRTEC) 10 MG tablet Take 10 mg by mouth daily as needed for allergies.    . fish oil-omega-3 fatty acids 1000 MG capsule Take 2 g by mouth daily.     . fluorouracil (EFUDEX) 5 % cream     . gabapentin (NEURONTIN) 300 MG capsule Take 300 mg by mouth 3 (three) times daily. Can take up to 4 times a day if needed    . Ginger, Zingiber officinalis, (GINGER ROOT) 500 MG CAPS Take 1 capsule by mouth daily.     . Glucosamine Sulfate 1000 MG CAPS Take 2 capsules (2,000 mg total) by mouth daily.    Marland Kitchen guaiFENesin-dextromethorphan (ROBITUSSIN DM) 100-10 MG/5ML syrup Take 10 mLs by mouth every 6 (six) hours as needed for cough.    Marland Kitchen HYDROcodone-acetaminophen (NORCO/VICODIN) 5-325 MG tablet Take 1 tablet by mouth 2-3 times daily as needed for pain 90 tablet 0  . hydrocortisone sodium succinate (SOLU-CORTEF) 100 MG SOLR injection Inject 100 mg into the muscle See admin instructions. Take as needed when  unable to swallow prednisone tablets     . ipratropium (ATROVENT) 0.06 % nasal spray Place 2 sprays into both nostrils in the morning and at bedtime.     Marland Kitchen ketoconazole (NIZORAL) 2 % cream     . Lancets (ONETOUCH DELICA PLUS [id number]) MISC USE TO TEST 3 TIMESUDAILY.    Marland Kitchen leflunomide (ARAVA) 20 MG tablet TAKE 1 TABLET BY MOUTH ONCE DAILY. 90 tablet 0  . LEVEMIR FLEXTOUCH 100 UNIT/ML FlexPen SMARTSIG:10 Unit(s) SUB-Q Daily    . losartan (COZAAR) 100 MG tablet Take 100 mg by mouth daily.    . metoprolol succinate (TOPROL-XL) 50 MG 24 hr tablet Take 1 tablet (50 mg  total) by mouth daily. Take with or immediately following a meal.    . Misc Natural Products (TART CHERRY ADVANCED PO) Take 1 tablet by mouth daily. daily     . Multiple Vitamin (MULTIVITAMIN) tablet Take 1 tablet by mouth daily.      . nortriptyline (PAMELOR) 25 MG capsule TAKE (1) CAPSULE BY MOUTH AT BEDTIME. (Patient taking differently: Take 25 mg by mouth at bedtime. ) 30 capsule 0  . omeprazole (PRILOSEC) 20 MG capsule TAKE ONE CAPSULE BY MOUTH TWICE DAILY BEFORE MEALS. 180 capsule 3  . ONETOUCH VERIO test strip     . potassium chloride SA (K-DUR,KLOR-CON) 20 MEQ tablet Take 20 mEq by mouth daily.     . predniSONE (DELTASONE) 5 MG tablet TAKE (1) TABLET BY MOUTH EACH MORNING. 90 tablet 0  . Secukinumab (COSENTYX SENSOREADY PEN) 150 MG/ML SOAJ Inject 150 mg into the skin every 14 (fourteen) days. 2 pen 2  . spironolactone (ALDACTONE) 25 MG tablet Take 25 mg by mouth daily.     . SURE COMFORT INSULIN SYRINGE 31G X 5/16" 0.3 ML MISC     . SURE COMFORT PEN NEEDLES 31G X 5 MM MISC     . topiramate (TOPAMAX) 200 MG tablet TAKE (1) TABLET BY MOUTH AT BEDTIME. 90 tablet 0  . Turmeric 500 MG CAPS Take 1 capsule by mouth daily.      No current facility-administered medications for this visit.    Allergies as of 11/04/2019 - Review Complete 11/04/2019  Allergen Reaction Noted  . Adalimumab Rash and Other (See Comments)   . Imuran [azathioprine sodium] Rash 07/03/2011  . Methotrexate Other (See Comments)   . Plaquenil [hydroxychloroquine sulfate] Rash 07/03/2011  . Sulfonamide derivatives Rash   . Ceftin [cefuroxime axetil] Nausea Only 07/06/2014  . Morphine Nausea And Vomiting     ROS:  General: Negative for anorexia, weight loss, fever, chills, fatigue, weakness. ENT: Negative for hoarseness, difficulty swallowing , nasal congestion. See HPI CV: Negative for chest pain, angina, palpitations, dyspnea on exertion, peripheral edema.  Respiratory: Negative for dyspnea at rest, dyspnea on  exertion, cough, sputum, wheezing.  GI: See history of present illness. GU:  Negative for dysuria, hematuria, urinary incontinence, urinary frequency, nocturnal urination.  Endo: Negative for unusual weight change.    Physical Examination:   BP 133/79   Pulse 98   Temp (!) 97.1 F (36.2 C)   Ht 4\' 11"  (1.499 m)   Wt 138 lb 6.4 oz (62.8 kg)   LMP 02/01/1999   BMI 27.95 kg/m   General: Well-nourished, well-developed in no acute distress.  Eyes: No icterus. Mouth: masked Lungs: Clear to auscultation bilaterally.  Heart: Regular rate and rhythm, no murmurs rubs or gallops.  Abdomen: Bowel sounds are normal, nontender, nondistended, no hepatosplenomegaly or masses, no abdominal bruits  or hernia , no rebound or guarding.   Extremities: No lower extremity edema. No clubbing or deformities. Neuro: Alert and oriented x 4   Skin: Warm and dry, no jaundice.   Psych: Alert and cooperative, normal mood and affect.  Labs:  Lab Results  Component Value Date   CREATININE 0.90 11/03/2019   BUN 25 11/03/2019   NA 134 (L) 11/03/2019   K 4.8 11/03/2019   CL 101 11/03/2019   CO2 24 11/03/2019   Lab Results  Component Value Date   ALT 28 11/03/2019   AST 24 11/03/2019   ALKPHOS 56 02/10/2019   BILITOT 0.3 11/03/2019   Lab Results  Component Value Date   WBC 13.3 (H) 11/03/2019   HGB 13.5 11/03/2019   HCT 40.2 11/03/2019   MCV 93.1 11/03/2019   PLT 261 11/03/2019     Imaging Studies: No results found.   Impression/plan:  Pleasant 69 year old lady with history of esophageal dysmotility disorder, GERD, collagenous colitis presenting for follow-up. Doing fairly well from a GI standpoint. Reflux is well controlled. She has been on omeprazole twice daily for quite some time. Recommend trying to reduce to once daily dosing to minimize risk of side effects. Collagenous colitis appears to be in remission. No issues with diarrhea. She has had some vague gas seems to be worse in the early  morning hours. Usually associated with a 5 AM bowel movement which is new for her. Trial of probiotic for 4 weeks, if no improvement switch to a different type of probiotic for an additional 4 weeks. Recommend low FODMAP diet. Colonoscopy is up-to-date. Call if ongoing symptoms.  History of esophageal dysmotility, remote barium pill esophagram showed diffuse esophageal motility disorder, modified barium swallow study with minimal swallowing dysfunction as outlined above. Continue to monitor symptoms for now. If she continues to have progression of her symptoms, would consider esophageal manometry as next step.  Return to the office in 1 year or call sooner if needed.

## 2019-11-05 NOTE — Progress Notes (Signed)
Cc'ed to pcp °

## 2019-11-06 NOTE — Progress Notes (Signed)
White cell count is elevated due to prednisone use.  Calcium is elevated.  Please advise patient to avoid calcium supplement.  She should get calcium from her diet.

## 2019-11-11 ENCOUNTER — Other Ambulatory Visit: Payer: Self-pay | Admitting: Rheumatology

## 2019-11-11 DIAGNOSIS — I10 Essential (primary) hypertension: Secondary | ICD-10-CM | POA: Diagnosis not present

## 2019-11-11 DIAGNOSIS — E1169 Type 2 diabetes mellitus with other specified complication: Secondary | ICD-10-CM | POA: Diagnosis not present

## 2019-11-11 DIAGNOSIS — M791 Myalgia, unspecified site: Secondary | ICD-10-CM | POA: Diagnosis not present

## 2019-11-11 DIAGNOSIS — L4052 Psoriatic arthritis mutilans: Secondary | ICD-10-CM | POA: Diagnosis not present

## 2019-11-11 DIAGNOSIS — E785 Hyperlipidemia, unspecified: Secondary | ICD-10-CM | POA: Diagnosis not present

## 2019-11-11 NOTE — Telephone Encounter (Signed)
Last Visit: 10/13/2019 Next Visit: 01/20/2020  Okay to refill per Dr. Estanislado Pandy

## 2019-11-13 ENCOUNTER — Other Ambulatory Visit: Payer: Self-pay | Admitting: Rheumatology

## 2019-11-13 MED ORDER — COSENTYX SENSOREADY PEN 150 MG/ML ~~LOC~~ SOAJ: 150.0000 mg | SUBCUTANEOUS | 2 refills | Status: DC

## 2019-11-13 NOTE — Telephone Encounter (Signed)
Last Visit:10/13/2019 Next Visit:01/20/2020 Labs: 11/03/2019 White cell count is elevated due to prednisone use. Calcium is elevated TB Gold: 09/03/2019 Neg   Current Dose per office note on 10/13/2019: Cosentyx 150 mg subcu injections every 14 days Dx: Psoriatic arthritis   Okay to refill Cosentyx?

## 2019-11-13 NOTE — Telephone Encounter (Signed)
Patient called requesting prescription refill of Cosentyx to be sent to [facility] by [facility].

## 2019-11-18 ENCOUNTER — Ambulatory Visit: Payer: PPO | Admitting: Physician Assistant

## 2019-11-18 ENCOUNTER — Ambulatory Visit
Admission: EM | Admit: 2019-11-18 | Discharge: 2019-11-18 | Disposition: A | Discharge status: Home / Self Care | Payer: PPO | Attending: Emergency Medicine | Admitting: Emergency Medicine

## 2019-11-18 ENCOUNTER — Ambulatory Visit (INDEPENDENT_AMBULATORY_CARE_PROVIDER_SITE_OTHER): Payer: PPO

## 2019-11-18 DIAGNOSIS — R0781 Pleurodynia: Secondary | ICD-10-CM | POA: Diagnosis not present

## 2019-11-18 DIAGNOSIS — R109 Unspecified abdominal pain: Secondary | ICD-10-CM

## 2019-11-18 NOTE — ED Triage Notes (Signed)
Pt has report of right upper flank / back pain. Hurts with palpation

## 2019-11-18 NOTE — Discharge Instructions (Addendum)
Take pain medication as prescribed Follow-up with PCP Follow RICE instruction as attached Return or go to ED for worsening of symptoms

## 2019-11-18 NOTE — ED Provider Notes (Signed)
[facility]   Chief Complaint  Patient presents with  . Flank Pain     SUBJECTIVE:  Tonya Hall is a 69 y.o. female who presented to the urgent care with a complaint of right flank/back pain for the past few days.  Denies any precipitating event.  He localizes the pain to the to the right flank/back.  He describes the pain as constant and achy.  He has tried OTC medications without relief.  His symptoms are made worse with ROM.  He denies similar symptoms in the past.  Denies chills, fever, nausea, vomiting, diarrhea.   LMP: Patient's last menstrual period was 02/01/1999.  ROS: As in HPI.  All other pertinent ROS negative.     Past Medical History:  Diagnosis Date  . Asthma    Albuterol in haler prn  . Cataract    immature unsure which eye  . Chronic back pain   . COVID-19 October/November 2020  . Degenerative disk disease    psoriatic  . Diabetes mellitus without complication (HCC)    diet controlled  . Esophageal motility disorder    Non-specific, see modified barium study/speech path, BP  . GERD (gastroesophageal reflux disease)    takes Protonix bid  . History of blood transfusion    post c-section  . History of bronchitis   . HTN (hypertension)   . Hyperlipidemia    takes Pravastatin daily  . Lymphocytic colitis 05/26/2010   Responded to Entocort x 3 MOS  . Neuropathy   . Nonallergic rhinitis   . Osteoporosis    gets Boniva every 3 months  . Peripheral edema    takes HCTZ daily  . PONV (postoperative nausea and vomiting)   . Psoriatic arthritis ([place])    Dr. Katherina Right  . Raynaud's disease   . Seasonal allergies   . Shingles 2020  . SUI (stress urinary incontinence, female)   . Urinary urgency    Past Surgical History:  Procedure Laterality Date  . BIOPSY  04/17/2016   Procedure: BIOPSY;  Surgeon: Danie Binder, MD;  Location: [facility];  Service: Endoscopy;;  random colon bx's  . CERVICAL SPINE SURGERY  09/09/2017  . CESAREAN  SECTION  1974, 1978      . COLONOSCOPY  06/19/2008   [id number] internal hemorrhoids/mild sigmoin colon diverticulosis  . COLONOSCOPY  2012   Dr. Gala Romney: normal rectum, diverticula, lymphocytic colitis   . COLONOSCOPY WITH PROPOFOL N/A 04/17/2016   Dr. Oneida Alar: Normal terminal ileum, internal/external hemorrhoids, random colon biopsies consistent with collagenous colitis  . DILATION AND CURETTAGE OF UTERUS  1977   spontaneous abortion  . ESOPHAGOGASTRODUODENOSCOPY  06/19/2008   [id number] gastritis  . LUMBAR DISC ARTHROPLASTY  7/98  . LUMBAR FUSION  6/03, 11/08, 11/14   L4-5 fusion, L2-3 fusion, L1-2  . ODONTOID SCREW INSERTION N/A 01/27/2018   Procedure: ODONTOID SCREW INSERTION;  Surgeon: Newman Pies, MD;  Location: [facility];  Service: Neurosurgery;  Laterality: N/A;  ODONTOID SCREW INSERTION  . TONSILLECTOMY AND ADENOIDECTOMY    . TUBAL LIGATION     Allergies  Allergen Reactions  . Adalimumab Rash and Other (See Comments)    FEVER, Fast pulse  . Imuran [Azathioprine Sodium] Rash    Denies Airway involvement  . Methotrexate Other (See Comments)    Liver Enzyme elevation - after 1 month of use  . Plaquenil [Hydroxychloroquine Sulfate] Rash    Denies airway involvement.   . Sulfonamide Derivatives Rash    Occurred with Sulfasalazine.  Rash only.   . Ceftin [Cefuroxime Axetil] Nausea Only    Upset stomach  . Morphine Nausea And Vomiting    IV morphine caused N and V  After surgery.   Has tolerated Kadian in the past    No current facility-administered medications on file prior to encounter.   Current Outpatient Medications on File Prior to Encounter  Medication Sig Dispense Refill  . albuterol (PROVENTIL HFA;VENTOLIN HFA) 108 (90 BASE) MCG/ACT inhaler Inhale 2 puffs into the lungs every 6 (six) hours as needed for wheezing or shortness of breath.     . Apremilast (OTEZLA) 30 MG TABS Take 30 mg by mouth 2 (two) times daily.    Marland Kitchen aspirin 81 MG tablet Take 81 mg by mouth daily.       Marland Kitchen azelastine (ASTELIN) 0.1 % nasal spray     . BREO ELLIPTA 200-25 MCG/INH AEPB Inhale 1 puff into the lungs daily.    . Calcium Carb-Cholecalciferol (CALCIUM 600 + D PO) Take 1 tablet by mouth 2 (two) times daily.    . cetirizine (ZYRTEC) 10 MG tablet Take 10 mg by mouth daily as needed for allergies.    . fish oil-omega-3 fatty acids 1000 MG capsule Take 2 g by mouth daily.     . fluorouracil (EFUDEX) 5 % cream     . gabapentin (NEURONTIN) 300 MG capsule Take 300 mg by mouth 3 (three) times daily. Can take up to 4 times a day if needed    . Ginger, Zingiber officinalis, (GINGER ROOT) 500 MG CAPS Take 1 capsule by mouth daily.     . Glucosamine Sulfate 1000 MG CAPS Take 2 capsules (2,000 mg total) by mouth daily.    Marland Kitchen guaiFENesin-dextromethorphan (ROBITUSSIN DM) 100-10 MG/5ML syrup Take 10 mLs by mouth every 6 (six) hours as needed for cough.    Marland Kitchen HYDROcodone-acetaminophen (NORCO/VICODIN) 5-325 MG tablet Take 1 tablet by mouth 2-3 times daily as needed for pain 90 tablet 0  . hydrocortisone sodium succinate (SOLU-CORTEF) 100 MG SOLR injection Inject 100 mg into the muscle See admin instructions. Take as needed when unable to swallow prednisone tablets     . ipratropium (ATROVENT) 0.06 % nasal spray Place 2 sprays into both nostrils in the morning and at bedtime.     Marland Kitchen ketoconazole (NIZORAL) 2 % cream     . Lancets (ONETOUCH DELICA PLUS [id number]) MISC USE TO TEST 3 TIMESUDAILY.    Marland Kitchen leflunomide (ARAVA) 20 MG tablet TAKE 1 TABLET BY MOUTH ONCE DAILY. 90 tablet 0  . LEVEMIR FLEXTOUCH 100 UNIT/ML FlexPen SMARTSIG:10 Unit(s) SUB-Q Daily    . losartan (COZAAR) 100 MG tablet Take 100 mg by mouth daily.    . metoprolol succinate (TOPROL-XL) 50 MG 24 hr tablet Take 1 tablet (50 mg total) by mouth daily. Take with or immediately following a meal.    . Misc Natural Products (TART CHERRY ADVANCED PO) Take 1 tablet by mouth daily. daily     . Multiple Vitamin (MULTIVITAMIN) tablet Take 1 tablet by mouth  daily.      . nortriptyline (PAMELOR) 25 MG capsule TAKE (1) CAPSULE BY MOUTH AT BEDTIME. (Patient taking differently: Take 25 mg by mouth at bedtime. ) 30 capsule 0  . omeprazole (PRILOSEC) 20 MG capsule TAKE ONE CAPSULE BY MOUTH TWICE DAILY BEFORE MEALS. 180 capsule 3  . ONETOUCH VERIO test strip     . potassium chloride SA (K-DUR,KLOR-CON) 20 MEQ tablet Take 20 mEq by mouth daily.     Marland Kitchen  predniSONE (DELTASONE) 5 MG tablet TAKE (1) TABLET BY MOUTH EACH MORNING. 90 tablet 0  . Secukinumab (COSENTYX SENSOREADY PEN) 150 MG/ML SOAJ Inject 150 mg into the skin every 14 (fourteen) days. 2 mL 2  . spironolactone (ALDACTONE) 25 MG tablet Take 25 mg by mouth daily.     . SURE COMFORT INSULIN SYRINGE 31G X 5/16" 0.3 ML MISC     . SURE COMFORT PEN NEEDLES 31G X 5 MM MISC     . topiramate (TOPAMAX) 200 MG tablet TAKE (1) TABLET BY MOUTH AT BEDTIME. 90 tablet 0  . Turmeric 500 MG CAPS Take 1 capsule by mouth daily.     . [DISCONTINUED] Glucosamine 500 MG TABS Take 4 tablets by mouth daily.      . [DISCONTINUED] simvastatin (ZOCOR) 40 MG tablet Take 20 mg by mouth at bedtime.      Social History   Socioeconomic History  . Marital status: Married    Spouse name: Not on file  . Number of children: Not on file  . Years of education: Not on file  . Highest education level: Not on file  Occupational History  . Occupation: Case Education officer, environmental: [employer]    Comment: Engineer, technical sales  Tobacco Use  . Smoking status: Never Smoker  . Smokeless tobacco: Never Used  Vaping Use  . Vaping Use: Never used  Substance and Sexual Activity  . Alcohol use: No  . Drug use: No  . Sexual activity: Not Currently    Partners: Male    Birth control/protection: Surgical    Comment: BTL  Other Topics Concern  . Not on file  Social History Narrative   ATTENDS [employer]. 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   Social Determinants of Health   Financial Resource Strain:   . Difficulty of Paying Living Expenses:    Food Insecurity:   . Worried About Charity fundraiser in the Last Year:   . Arboriculturist in the Last Year:   Transportation Needs:   . Film/video editor (Medical):   Marland Kitchen Lack of Transportation (Non-Medical):   Physical Activity:   . Days of Exercise per Week:   . Minutes of Exercise per Session:   Stress:   . Feeling of Stress :   Social Connections:   . Frequency of Communication with Friends and Family:   . Frequency of Social Gatherings with Friends and Family:   . Attends Religious Services:   . Active Member of Clubs or Organizations:   . Attends Archivist Meetings:   Marland Kitchen Marital Status:   Intimate Partner Violence:   . Fear of Current or Ex-Partner:   . Emotionally Abused:   Marland Kitchen Physically Abused:   . Sexually Abused:    Family History  Problem Relation Age of Onset  . Diabetes Mother   . Pancreatitis Mother   . Arthritis Mother        psoriatic   . Fibromyalgia Mother   . Rheumatic fever Father   . Diabetes Other        grandparents  . Skin cancer Other        grandfather  . Hypertension Other        grandparent  . Congestive Heart Failure Other        grandfather  . Parkinson's disease Other        grandmother  . Colon cancer Maternal Uncle   . Fibromyalgia Sister   . Rheum arthritis  Sister   . Diabetes Sister   . Fibromyalgia Sister   . Diabetes Sister   . Rheum arthritis Sister   . Hypertension Son   . Colon polyps Neg Hx     OBJECTIVE:  Vitals:   11/18/19 1242  BP: (!) 147/78  Pulse: 86  Resp: 20  Temp: 97.9 F (36.6 C)  SpO2: 96%   Physical Exam Vitals and nursing note reviewed.  Constitutional:      General: She is not in acute distress.    Appearance: Normal appearance. She is normal weight. She is not ill-appearing, toxic-appearing or diaphoretic.  HENT:     Head: Normocephalic.  Cardiovascular:     Rate and Rhythm: Normal rate and regular rhythm.     Pulses: Normal pulses.     Heart sounds: Normal heart sounds.  No murmur heard.  No friction rub. No gallop.   Pulmonary:     Effort: Pulmonary effort is normal. No respiratory distress.     Breath sounds: Normal breath sounds. No stridor. No wheezing, rhonchi or rales.  Chest:     Chest wall: No tenderness.  Abdominal:     Tenderness: There is no right CVA tenderness or left CVA tenderness.  Musculoskeletal:        General: Tenderness present.     Thoracic back: Tenderness present.  Neurological:     Mental Status: She is alert and oriented to person, place, and time.     Labs Reviewed - No data to display   RADIOLOGY  DG Chest 2 View  Result Date: 11/18/2019 CLINICAL DATA:  Right rib pain EXAM: CHEST - 2 VIEW COMPARISON:  04/30/2019 FINDINGS: No new consolidation or edema. No pleural effusion or pneumothorax. Stable cardiomediastinal contours. Chronic lateral left sixth rib fracture. No new right rib fracture identified on this nondedicated study. IMPRESSION: No acute process in the chest. Electronically Signed   By: Macy Mis M.D.   On: 11/18/2019 13:31    ASSESSMENT & PLAN:  1. Flank pain     No orders of the defined types were placed in this encounter.  Patient is stable at discharge.  Right chest x-ray is negative for bony abnormality including fracture or dislocation.  I have reviewed the x-ray myself and the radiologist interpretation.  I am in agreement with the radiologist interpretation.  Discharge Instructions Take pain medication as prescribed Follow-up with PCP Follow RICE instruction as attached Return or go to ED for worsening of symptoms  Outlined signs and symptoms indicating need for more acute intervention. Patient verbalized understanding. After Visit Summary given.  Note: This document was prepared using Dragon voice recognition software and may include unintentional dictation errors.    Emerson Monte, [place] 11/18/19 1342

## 2019-11-19 DIAGNOSIS — M5414 Radiculopathy, thoracic region: Secondary | ICD-10-CM | POA: Diagnosis not present

## 2019-11-24 ENCOUNTER — Other Ambulatory Visit: Payer: Self-pay | Admitting: Internal Medicine

## 2019-11-24 ENCOUNTER — Other Ambulatory Visit (HOSPITAL_COMMUNITY): Payer: Self-pay | Admitting: Internal Medicine

## 2019-11-24 DIAGNOSIS — M5414 Radiculopathy, thoracic region: Secondary | ICD-10-CM

## 2019-12-09 ENCOUNTER — Other Ambulatory Visit: Payer: Self-pay

## 2019-12-09 ENCOUNTER — Ambulatory Visit (HOSPITAL_COMMUNITY)
Admission: RE | Admit: 2019-12-09 | Discharge: 2019-12-09 | Disposition: A | Discharge status: Home / Self Care | Payer: PPO | Source: Ambulatory Visit | Attending: Internal Medicine | Admitting: Internal Medicine

## 2019-12-09 DIAGNOSIS — M5124 Other intervertebral disc displacement, thoracic region: Secondary | ICD-10-CM | POA: Diagnosis not present

## 2019-12-09 DIAGNOSIS — M5414 Radiculopathy, thoracic region: Secondary | ICD-10-CM | POA: Diagnosis not present

## 2019-12-09 DIAGNOSIS — M47814 Spondylosis without myelopathy or radiculopathy, thoracic region: Secondary | ICD-10-CM | POA: Diagnosis not present

## 2019-12-09 DIAGNOSIS — M5125 Other intervertebral disc displacement, thoracolumbar region: Secondary | ICD-10-CM | POA: Diagnosis not present

## 2019-12-09 DIAGNOSIS — M4804 Spinal stenosis, thoracic region: Secondary | ICD-10-CM | POA: Diagnosis not present

## 2019-12-09 MED ORDER — GADOBUTROL 1 MMOL/ML IV SOLN
7.0000 mL | Freq: Once | INTRAVENOUS | Status: AC | PRN
  Administered 2019-12-09: 7 mL via INTRAVENOUS

## 2019-12-10 ENCOUNTER — Telehealth: Payer: Self-pay | Admitting: Rheumatology

## 2019-12-10 MED ORDER — HYDROCODONE-ACETAMINOPHEN 5-325 MG PO TABS: ORAL_TABLET | ORAL | 0 refills | Status: DC

## 2019-12-10 NOTE — Telephone Encounter (Signed)
Patient request refill on Hydrocodone sent to [facility]. °

## 2019-12-10 NOTE — Telephone Encounter (Signed)
Last Visit:10/13/2019 Next Visit:01/20/2020 UDS: 10/21/2019 Narc Agreement: 10/21/2019  Last Fill: 11/02/2019  Okay to refill Hydrocodone?

## 2019-12-16 ENCOUNTER — Ambulatory Visit (HOSPITAL_COMMUNITY): Payer: PPO

## 2019-12-17 ENCOUNTER — Other Ambulatory Visit: Payer: Self-pay | Admitting: Rheumatology

## 2019-12-17 NOTE — Telephone Encounter (Signed)
Last Visit: 10/13/2019 Next Visit: 01/15/2020  Current Dose per office note 10/13/2019: prednisone 5 mg daily DX: Psoriatic arthritis  Okay to refill per Dr. Estanislado Pandy.

## 2019-12-18 DIAGNOSIS — H02834 Dermatochalasis of left upper eyelid: Secondary | ICD-10-CM | POA: Diagnosis not present

## 2019-12-18 DIAGNOSIS — H02831 Dermatochalasis of right upper eyelid: Secondary | ICD-10-CM | POA: Diagnosis not present

## 2019-12-21 ENCOUNTER — Ambulatory Visit (HOSPITAL_COMMUNITY)
Admission: RE | Admit: 2019-12-21 | Discharge: 2019-12-21 | Disposition: A | Discharge status: Home / Self Care | Payer: PPO | Source: Ambulatory Visit | Attending: Obstetrics & Gynecology | Admitting: Obstetrics & Gynecology

## 2019-12-21 ENCOUNTER — Other Ambulatory Visit: Payer: Self-pay

## 2019-12-21 DIAGNOSIS — Z1231 Encounter for screening mammogram for malignant neoplasm of breast: Secondary | ICD-10-CM | POA: Diagnosis not present

## 2019-12-22 ENCOUNTER — Telehealth: Payer: Self-pay | Admitting: Rheumatology

## 2019-12-22 ENCOUNTER — Other Ambulatory Visit (HOSPITAL_COMMUNITY): Payer: Self-pay | Admitting: Obstetrics & Gynecology

## 2019-12-22 DIAGNOSIS — R928 Other abnormal and inconclusive findings on diagnostic imaging of breast: Secondary | ICD-10-CM

## 2019-12-22 DIAGNOSIS — Z6827 Body mass index (BMI) 27.0-27.9, adult: Secondary | ICD-10-CM | POA: Diagnosis not present

## 2019-12-22 DIAGNOSIS — I10 Essential (primary) hypertension: Secondary | ICD-10-CM | POA: Diagnosis not present

## 2019-12-22 DIAGNOSIS — M4312 Spondylolisthesis, cervical region: Secondary | ICD-10-CM | POA: Diagnosis not present

## 2019-12-22 NOTE — Telephone Encounter (Signed)
Dr. Ellie Lunch from [facility] called stating Tonya Hall is a mutual patient and at her appointment noticed edema of her eyelid that is not typical.  Dr. Ellie Lunch requested Dr. Estanislado Pandy return her call to discuss patient's liver function and recent labwork.  Please call back on cell [phone]

## 2019-12-22 NOTE — Telephone Encounter (Signed)
Contacted Dr. Ellie Lunch and reviewed lab results with her. Also faxed a copy of most recent CBC/CMP to her. She states the patient come to she her to discuss surgery on her eyelids. Patient has a lot of edema and she was concerned about possible liver issues.

## 2019-12-22 NOTE — Telephone Encounter (Signed)
Patient advised she should hold off on taking the Calcium and Vitamin D until her next lab work. Patient verbalized understanding.

## 2019-12-22 NOTE — Telephone Encounter (Signed)
Patient called stating Dr. Estanislado Pandy told her to discontinue taking Calcium supplement and Vitamin D due to her Calcium being elevated.  Patient is requesting a return call to let her know if she needs to wait until her November labwork before restarting Calcium.

## 2019-12-28 ENCOUNTER — Telehealth: Payer: Self-pay

## 2019-12-28 ENCOUNTER — Encounter: Payer: Self-pay | Admitting: Rheumatology

## 2019-12-28 ENCOUNTER — Ambulatory Visit: Payer: PPO | Admitting: Physician Assistant

## 2019-12-28 ENCOUNTER — Ambulatory Visit (INDEPENDENT_AMBULATORY_CARE_PROVIDER_SITE_OTHER): Payer: PPO | Admitting: Rheumatology

## 2019-12-28 ENCOUNTER — Other Ambulatory Visit: Payer: Self-pay

## 2019-12-28 VITALS — BP 148/80 | HR 89 | Ht 59.0 in | Wt 139.0 lb

## 2019-12-28 DIAGNOSIS — Z7189 Other specified counseling: Secondary | ICD-10-CM

## 2019-12-28 DIAGNOSIS — Z8719 Personal history of other diseases of the digestive system: Secondary | ICD-10-CM

## 2019-12-28 DIAGNOSIS — M810 Age-related osteoporosis without current pathological fracture: Secondary | ICD-10-CM | POA: Diagnosis not present

## 2019-12-28 DIAGNOSIS — Z79899 Other long term (current) drug therapy: Secondary | ICD-10-CM | POA: Diagnosis not present

## 2019-12-28 DIAGNOSIS — M79641 Pain in right hand: Secondary | ICD-10-CM | POA: Diagnosis not present

## 2019-12-28 DIAGNOSIS — M5030 Other cervical disc degeneration, unspecified cervical region: Secondary | ICD-10-CM | POA: Diagnosis not present

## 2019-12-28 DIAGNOSIS — I10 Essential (primary) hypertension: Secondary | ICD-10-CM

## 2019-12-28 DIAGNOSIS — M79642 Pain in left hand: Secondary | ICD-10-CM

## 2019-12-28 DIAGNOSIS — G894 Chronic pain syndrome: Secondary | ICD-10-CM

## 2019-12-28 DIAGNOSIS — D692 Other nonthrombocytopenic purpura: Secondary | ICD-10-CM

## 2019-12-28 DIAGNOSIS — K224 Dyskinesia of esophagus: Secondary | ICD-10-CM

## 2019-12-28 DIAGNOSIS — L4050 Arthropathic psoriasis, unspecified: Secondary | ICD-10-CM

## 2019-12-28 DIAGNOSIS — M533 Sacrococcygeal disorders, not elsewhere classified: Secondary | ICD-10-CM | POA: Diagnosis not present

## 2019-12-28 DIAGNOSIS — L409 Psoriasis, unspecified: Secondary | ICD-10-CM

## 2019-12-28 DIAGNOSIS — M7062 Trochanteric bursitis, left hip: Secondary | ICD-10-CM

## 2019-12-28 DIAGNOSIS — M5134 Other intervertebral disc degeneration, thoracic region: Secondary | ICD-10-CM | POA: Diagnosis not present

## 2019-12-28 DIAGNOSIS — G5763 Lesion of plantar nerve, bilateral lower limbs: Secondary | ICD-10-CM

## 2019-12-28 DIAGNOSIS — M1711 Unilateral primary osteoarthritis, right knee: Secondary | ICD-10-CM

## 2019-12-28 DIAGNOSIS — K52831 Collagenous colitis: Secondary | ICD-10-CM

## 2019-12-28 DIAGNOSIS — Z8639 Personal history of other endocrine, nutritional and metabolic disease: Secondary | ICD-10-CM

## 2019-12-28 DIAGNOSIS — G8929 Other chronic pain: Secondary | ICD-10-CM

## 2019-12-28 DIAGNOSIS — I7300 Raynaud's syndrome without gangrene: Secondary | ICD-10-CM

## 2019-12-28 MED ORDER — LIDOCAINE HCL 1 % IJ SOLN
0.3000 mL | INTRAMUSCULAR | Status: AC | PRN
  Administered 2019-12-28: .3 mL

## 2019-12-28 MED ORDER — TRIAMCINOLONE ACETONIDE 40 MG/ML IJ SUSP
10.0000 mg | INTRAMUSCULAR | Status: AC | PRN
  Administered 2019-12-28: 10 mg via INTRA_ARTICULAR

## 2019-12-28 MED ORDER — LIDOCAINE HCL 1 % IJ SOLN
1.0000 mL | INTRAMUSCULAR | Status: AC | PRN
  Administered 2019-12-28: 1 mL

## 2019-12-28 MED ORDER — TRIAMCINOLONE ACETONIDE 40 MG/ML IJ SUSP
40.0000 mg | INTRAMUSCULAR | Status: AC | PRN
  Administered 2019-12-28: 40 mg via INTRA_ARTICULAR

## 2019-12-28 NOTE — Telephone Encounter (Signed)
Patient called stating she received new [employer] application for next year for [place].  Patient states she has questions regarding what needs to be completed.

## 2019-12-28 NOTE — Progress Notes (Signed)
Office Visit Note  Patient: Tonya Hall             Date of Birth: 1950-11-06           MRN: [id number]             PCP: [name], MD Referring: [name], MD Visit Date: 12/28/2019 Occupation: @GUAROCC @  Subjective:  Pain of the Right Hand, Left SI Joint (having pain, would like injection), and Right Middle Finger (pain and swelling)   History of Present Illness: Tonya Hall is a 69 y.o. female with history of psoriatic arthritis, osteoarthritis and disc disease.  She states she has been having pain and swelling in almost all of her joints.  She states she has swelling in all of her fingers and has difficulty making a fist.  She also has discomfort in her left SI joint and has difficulty walking.  She has worsening of the disc disease and was seen by her neurosurgeon and was advised surgery.  Activities of Daily Living:  Patient reports morning stiffness all day.   Patient Reports nocturnal pain.  Difficulty dressing/grooming: Reports Difficulty climbing stairs: Reports Difficulty getting out of chair: Reports Difficulty using hands for taps, buttons, cutlery, and/or writing: Reports  Review of Systems  Constitutional: Positive for fatigue.  HENT: Positive for mouth dryness and nose dryness. Negative for mouth sores.   Eyes: Positive for itching, visual disturbance and dryness. Negative for pain.  Respiratory: Positive for cough and shortness of breath. Negative for hemoptysis and difficulty breathing.   Cardiovascular: Positive for swelling in legs/feet. Negative for chest pain and palpitations.  Gastrointestinal: Negative for abdominal pain, blood in stool, constipation and diarrhea.  Endocrine: Negative for increased urination.  Genitourinary: Negative for difficulty urinating.  Musculoskeletal: Positive for arthralgias, joint pain, joint swelling and morning stiffness. Negative for myalgias, muscle weakness, muscle tenderness and myalgias.  Skin: Positive for  color change. Negative for rash.  Allergic/Immunologic: Negative for susceptible to infections.  Neurological: Positive for weakness. Negative for dizziness, headaches and memory loss.  Hematological: Negative for swollen glands.  Psychiatric/Behavioral: Negative for confusion and sleep disturbance.    PMFS History:  Patient Active Problem List   Diagnosis Date Noted  . AKI (acute kidney injury) ([place]) 02/07/2019  . Acute respiratory failure with hypoxia ([place]) 02/06/2019  . Acute respiratory disease due to COVID-19 virus 02/06/2019  . Community acquired pneumonia 02/06/2019  . Hyponatremia 02/06/2019  . Asthma   . Diabetes mellitus without complication ([place])   . Closed nondisplaced odontoid fracture with type II morphology ([place]) 01/27/2018  . Odontoid fracture ([place]) 01/24/2018  . Chest pain 11/14/2017  . Transaminitis 06/22/2016  . Collagenous colitis 05/23/2016  . Psoriasis 05/21/2016  . High risk medication use 02/15/2016  . Age-related osteoporosis without current pathological fracture 02/15/2016  . Trochanteric bursitis, left hip 02/15/2016  . Sacroiliitis, not elsewhere classified ([place]) 02/15/2016  . Chronic pain syndrome 02/15/2016  . Abnormal weight gain 06/25/2012  . Hypertension 07/03/2011  . Hypercholesteremia 07/03/2011  . Psoriatic arthritis ([place]) 07/03/2011  . Osteoarthritis of multiple joints 07/03/2011  . Esophageal motility disorder 02/14/2011  . Cough 02/14/2011  . GERD 05/05/2010    Past Medical History:  Diagnosis Date  . Asthma    Albuterol in haler prn  . Cataract    immature unsure which eye  . Chronic back pain   . COVID-19 October/November 2020  . Degenerative disk disease    psoriatic  . Diabetes mellitus without complication (  [place])    diet controlled  . Esophageal motility disorder    Non-specific, see modified barium study/speech path, BP  . GERD (gastroesophageal reflux disease)    takes Protonix bid  . History of blood transfusion     post c-section  . History of bronchitis   . HTN (hypertension)   . Hyperlipidemia    takes Pravastatin daily  . Lymphocytic colitis 05/26/2010   Responded to Entocort x 3 MOS  . Neuropathy   . Nonallergic rhinitis   . Osteoporosis    gets Boniva every 3 months  . Peripheral edema    takes HCTZ daily  . PONV (postoperative nausea and vomiting)   . Psoriatic arthritis ([place])    Dr. Katherina Right  . Raynaud's disease   . Seasonal allergies   . Shingles 2020  . SUI (stress urinary incontinence, female)   . Urinary urgency     Family History  Problem Relation Age of Onset  . Diabetes Mother   . Pancreatitis Mother   . Arthritis Mother        psoriatic   . Fibromyalgia Mother   . Rheumatic fever Father   . Diabetes Other        grandparents  . Skin cancer Other        grandfather  . Hypertension Other        grandparent  . Congestive Heart Failure Other        grandfather  . Parkinson's disease Other        grandmother  . Colon cancer Maternal Uncle   . Fibromyalgia Sister   . Rheum arthritis Sister   . Diabetes Sister   . Fibromyalgia Sister   . Diabetes Sister   . Rheum arthritis Sister   . Hypertension Son   . Colon polyps Neg Hx    Past Surgical History:  Procedure Laterality Date  . BIOPSY  04/17/2016   Procedure: BIOPSY;  Surgeon: Danie Binder, MD;  Location[facility];  Service: Endoscopy;;  random colon bx's  . CERVICAL SPINE SURGERY  09/09/2017  . CESAREAN SECTION  1974, 1978      . COLONOSCOPY  06/19/2008   VWU:[id number] internal hemorrhoids/mild sigmoin colon diverticulosis  . COLONOSCOPY  2012   Dr. Gala Romney: normal rectum, diverticula, lymphocytic colitis   . COLONOSCOPY WITH PROPOFOL N/A 04/17/2016   Dr. Oneida Alar: Normal terminal ileum, internal/external hemorrhoids, random colon biopsies consistent with collagenous colitis  . DILATION AND CURETTAGE OF UTERUS  1977   spontaneous abortion  . ESOPHAGOGASTRODUODENOSCOPY  06/19/2008   JYN:WGNFAOZH gastritis    . LUMBAR DISC ARTHROPLASTY  7/98  . LUMBAR FUSION  6/03, 11/08, 11/14   L4-5 fusion, L2-3 fusion, L1-2  . ODONTOID SCREW INSERTION N/A 01/27/2018   Procedure: ODONTOID SCREW INSERTION;  Surgeon: Newman Pies, MD;  Location: [facility];  Service: Neurosurgery;  Laterality: N/A;  ODONTOID SCREW INSERTION  . TONSILLECTOMY AND ADENOIDECTOMY    . TUBAL LIGATION     Social History   Social History Narrative   ATTENDS [employer]. 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   Immunization History  Administered Date(s) Administered  . Influenza,inj,Quad PF,6+ Mos 01/21/2017  . Influenza-Unspecified 01/30/2017  . Moderna SARS-COVID-2 Vaccination 06/01/2019, 07/02/2019  . Pneumococcal Polysaccharide-23 02/19/2013  . Tdap 09/10/2016     Objective: Vital Signs: BP (!) 148/80 (BP Location: Left Arm, Patient Position: Sitting, Cuff Size: Small)   Pulse 89   Ht 4\' 11"  (1.499 m)   Wt 139 lb (63 kg)  LMP 02/01/1999   BMI 28.07 kg/m    Physical Exam Vitals and nursing note reviewed.  Constitutional:      Appearance: She is well-developed.  HENT:     Head: Normocephalic and atraumatic.  Eyes:     Conjunctiva/sclera: Conjunctivae normal.  Cardiovascular:     Rate and Rhythm: Normal rate and regular rhythm.     Heart sounds: Normal heart sounds.  Pulmonary:     Effort: Pulmonary effort is normal.     Breath sounds: Normal breath sounds.  Abdominal:     General: Bowel sounds are normal.     Palpations: Abdomen is soft.  Musculoskeletal:     Cervical back: Normal range of motion.  Lymphadenopathy:     Cervical: No cervical adenopathy.  Skin:    General: Skin is warm and dry.     Capillary Refill: Capillary refill takes less than 2 seconds.  Neurological:     Mental Status: She is alert and oriented to person, place, and time.  Psychiatric:        Behavior: Behavior normal.      Musculoskeletal Exam: She has limited range of motion of her cervical and lumbar spine.  Shoulder joints  and elbow joints in good range of motion.  She has synovitis of her bilateral PIP joints.  She had tenderness over left SI joint.  Hip joints and knee joints in good range of motion.  She is some tenderness across PIPs of her feet.  CDAI Exam: CDAI Score: -- Patient Global: --; Provider Global: -- Swollen: --; Tender: -- Joint Exam 12/28/2019   No joint exam has been documented for this visit   There is currently no information documented on the homunculus. Go to the Rheumatology activity and complete the homunculus joint exam.  Investigation: No additional findings.  Imaging: MR THORACIC SPINE W WO CONTRAST  Result Date: 12/09/2019 CLINICAL DATA:  Upper back pain for 3 weeks.  No recent injury EXAM: MRI THORACIC WITHOUT AND WITH CONTRAST TECHNIQUE: Multiplanar and multiecho pulse sequences of the thoracic spine were obtained without and with intravenous contrast. CONTRAST:  51mL GADAVIST GADOBUTROL 1 MMOL/ML IV SOLN COMPARISON:  MRI 11/22/2017 FINDINGS: MRI THORACIC SPINE FINDINGS Alignment: Unchanged spinal alignment with a mildly exaggerated upper thoracic kyphosis. Vertebrae: No fracture, evidence of discitis, or bone lesion. Cord:  Normal signal and morphology. Paraspinal and other soft tissues: Negative. No evidence of abnormal enhancement on postcontrast sequences. Disc levels: T1-2 through T7-8: No focal disc protrusion. Ligamentum flavum thickening is evident within the upper thoracic levels. No canal stenosis. Bilateral foramina are patent without evidence of neural impingement. T8-9: Small disc protrusion without foraminal or canal stenosis. T9-10: Small left paracentral disc protrusion and bilateral facet arthropathy result in mild-to-moderate left foraminal stenosis. No canal or right foraminal stenosis. T10-11: Advanced disc height loss with large right paracentral disc protrusion with approximately 11 mm of cranial extension (series 19, image 6), unchanged in appearance from prior.  Bilateral facet arthropathy results in moderate right worse than left foraminal stenosis with mild-to-moderate canal stenosis. No interval progression from prior. T11-12: Small central disc protrusion without foraminal or canal stenosis. T12-L1: Small central disc protrusion and bilateral facet arthrosis without foraminal or canal stenosis. Partially visualized posterior spinal fusion hardware at the L1 through L3 levels. There is also partially visualized ACDF hardware within the lower cervical spine. IMPRESSION: 1. No interval progression of the large right paracentral disc protrusion with cranial extension at T10-11. Moderate right worse than left foraminal  stenosis and mild-to-moderate canal stenosis at T10-11. 2. Additional mild degenerative changes throughout the remaining thoracic levels, also without interval progression from prior. Electronically Signed   By: Davina Poke D.O.   On: 12/09/2019 14:13   MM 3D SCREEN BREAST BILATERAL  Result Date: 12/22/2019 CLINICAL DATA:  Screening. EXAM: DIGITAL SCREENING BILATERAL MAMMOGRAM WITH TOMO AND CAD COMPARISON:  Prior films ACR Breast Density Category b: There are scattered areas of fibroglandular density. FINDINGS: In the right breast, a possible asymmetry warrants further evaluation. In the left breast, no findings suspicious for malignancy. Images were processed with CAD. IMPRESSION: Further evaluation is suggested for possible asymmetry in the right breast. RECOMMENDATION: Diagnostic mammogram and possibly ultrasound of the right breast. (Code:[id number]) The patient will be contacted regarding the findings, and additional imaging will be scheduled. BI-RADS CATEGORY  0: Incomplete. Need additional imaging evaluation and/or prior mammograms for comparison. Electronically Signed   By: Abelardo Diesel M.D.   On: 12/22/2019 12:32    Recent Labs: Lab Results  Component Value Date   WBC 13.3 (H) 11/03/2019   HGB 13.5 11/03/2019   PLT 261 11/03/2019   NA  134 (L) 11/03/2019   K 4.8 11/03/2019   CL 101 11/03/2019   CO2 24 11/03/2019   GLUCOSE 142 (H) 11/03/2019   BUN 25 11/03/2019   CREATININE 0.90 11/03/2019   BILITOT 0.3 11/03/2019   ALKPHOS 56 02/10/2019   AST 24 11/03/2019   ALT 28 11/03/2019   PROT 6.6 11/03/2019   ALBUMIN 2.7 (L) 02/10/2019   CALCIUM 10.8 (H) 11/03/2019   GFRAA 76 11/03/2019   QFTBGOLD Negative 05/05/2015   QFTBGOLDPLUS NEGATIVE 09/03/2019    Speciality Comments: No specialty comments available.  Procedures:  Sacroiliac Joint Inj on 12/28/2019 2:26 PM Indications: pain Details: 27 G 1.5 in needle, posterior approach Medications: 1 mL lidocaine 1 %; 40 mg triamcinolone acetonide 40 MG/ML Aspirate: 0 mL Outcome: tolerated well, no immediate complications Procedure, treatment alternatives, risks and benefits explained, specific risks discussed. Consent was given by the patient. Immediately prior to procedure a time out was called to verify the correct patient, procedure, equipment, support staff and site/side marked as required. Patient was prepped and draped in the usual sterile fashion.   Small Joint Inj on 12/28/2019 2:26 PM Indications: pain and joint swelling Details: 27 G needle, ultrasound-guided dorsal approach  Spinal Needle: No  Medications: 10 mg triamcinolone acetonide 40 MG/ML; 0.3 mL lidocaine 1 % Aspirate: 0 mL Outcome: tolerated well, no immediate complications Procedure, treatment alternatives, risks and benefits explained, specific risks discussed. Consent was given by the patient. Immediately prior to procedure a time out was called to verify the correct patient, procedure, equipment, support staff and site/side marked as required. Patient was prepped and draped in the usual sterile fashion.     Allergies: Adalimumab, Imuran [azathioprine sodium], Methotrexate, Plaquenil [hydroxychloroquine sulfate], Sulfonamide derivatives, Ceftin [cefuroxime axetil], and Morphine   Assessment / Plan:      Visit Diagnoses: Psoriatic arthritis (HCC)-she is having a flare with increased pain and swelling in her hands.  She is having pain in all of her joints.  She states she has not missed any medication doses.  She initially had very good response to Cosentyx.  Now she is having increased flares.  We discussed possibly switching her to [employer].  She would like to wait for now.  Psoriasis-she has no active psoriasis lesions.  High risk medication use - Cosentyx 150 mg sq injections every 14 days, Arava  20mg  po qd. Otezla 30 mg p.o. twice daily.  Pain in both hands-she been having pain and swelling in her bilateral hands.  Right third PIP joint was very painful and swollen.  Per her request right third PIP joint was injected with cortisone the procedure as described above.  Patient tolerated the procedure well.  A splint was applied.  Postprocedure instructions were given.  Chronic left SI joint pain -she has severe left SI joint pain.  Per her request left SI joint was injected with cortisone as described above.  She tolerated the procedure well.  Postprocedure instructions were given.  Primary osteoarthritis of right knee-chronic pain.  Raynaud's syndrome without gangrene  Trochanteric bursitis, left hip-the symptoms are tolerable currently.  Morton's neuroma of both feet  DDD (degenerative disc disease), cervical-she has been having increased pain and discomfort.  She was seen by her neurosurgeon recently.  She may require surgery.  I have advised that she will have to come off Cosentyx and Arava to get C-spine surgery.  She will call us to get instructions prior to surgery.  DDD (degenerative disc disease), thoracic-chronic pain.  Age-related osteoporosis without current pathological fracture - According to the patient her PCP orders her bone density.  She was recently switched from Reclast to Prolia due to having several fragility fractures  Chronic pain syndrome-hydrocodone 1 tablet by  mouth 2-3 times daily as needed for pain. UDS & narcotic agreement: 10/21/2019.  She has a lot of pain and discomfort due to severe arthritis.  Other medical problems are listed as follows:  History of type 2 diabetes mellitus-I advised her to monitor her blood glucose closely due to cortisone injection.  History of gastroesophageal reflux (GERD)  Essential hypertension-blood pressure is elevated.  She has been advised to monitor blood pressure closely.  Senile purpura ([place])  Esophageal motility disorder  Collagenous colitis  Educated about COVID-19 virus infection-she is up-to-date on her vaccination.  She has been advised to get booster.  Use of mask, social distancing and hand hygiene was discussed.  Use of monoclonal antibodies were also discussed in case she develops COVID-19 infection.  Orders: No orders of the defined types were placed in this encounter.  No orders of the defined types were placed in this encounter.    Follow-Up Instructions: Return in about 3 months (around 03/28/2020) for Psoriatic arthritis, Osteoarthritis.   Bo Merino, MD  Note - This record has been created using Editor, commissioning.  Chart creation errors have been sought, but may not always  have been located. Such creation errors do not reflect on  the standard of medical care.

## 2019-12-28 NOTE — Patient Instructions (Addendum)
Standing Labs We placed an order today for your standing lab work.   Please have your standing labs drawn in November and every 3 months  If possible, please have your labs drawn 2 weeks prior to your appointment so that the provider can discuss your results at your appointment.  We have open lab daily Monday through Thursday from 8:30-12:30 PM and 1:30-4:30 PM and Friday from 8:30-12:30 PM and 1:30-4:00 PM at the office of Dr. Bo Merino, [facility].   Please be advised, patients with office appointments requiring lab work will take precedents over walk-in lab work.  If possible, please come for your lab work on Monday and Friday afternoons, as you may experience shorter wait times. The office is located at [address] [place], [place], [place] [address] No appointment is necessary.   Labs are drawn by [facility]. Please bring your co-pay at the time of your lab draw.  You may receive a bill from [facility] for your lab work.  If you wish to have your labs drawn at another location, please call the office 24 hours in advance to send orders.  If you have any questions regarding directions or hours of operation,  please call [phone].   As a reminder, please drink plenty of water prior to coming for your lab work. Thanks!   COVID-19 vaccine recommendations:   COVID-19 vaccine is recommended for everyone (unless you are allergic to a vaccine component), even if you are on a medication that suppresses your immune system.   If you are on Methotrexate, Cellcept (mycophenolate), Rinvoq, Morrie Sheldon, and Olumiant- hold the medication for 1 week after each vaccine. Hold Methotrexate for 2 weeks after the single dose COVID-19 vaccine.   If you are on Orencia subcutaneous injection - hold medication one week prior to and one week after the first COVID-19 vaccine dose (only).   If you are on Orencia IV infusions- time vaccination administration so that the first COVID-19  vaccination will occur four weeks after the infusion and postpone the subsequent infusion by one week.   If you are on Cyclophosphamide or Rituxan infusions please contact your doctor prior to receiving the COVID-19 vaccine.   Do not take Tylenol or any anti-inflammatory medications (NSAIDs) 24 hours prior to the COVID-19 vaccination.   There is no direct evidence about the efficacy of the COVID-19 vaccine in individuals who are on medications that suppress the immune system.   Even if you are fully vaccinated, and you are on any medications that suppress your immune system, please continue to wear a mask, maintain at least six feet social distance and practice hand hygiene.   If you develop a COVID-19 infection, please contact your PCP or our office to determine if you need antibody infusion.  The booster vaccine is now available for immunocompromised patients. It is advised that if you had Pfizer vaccine you should get [employer] booster.  If you had a Moderna vaccine then you should get a Moderna booster. Johnson and Wynetta Emery does not have a booster vaccine at this time.  Please see the following web sites for updated information.   https://www.[email or web address].org/Portals/0/Files/COVID-19-Vaccination-Patient-Resources.pdf  https://www.[email or web address].org/About-Us/Newsroom/Press-Releases/ID/1159

## 2019-12-29 ENCOUNTER — Other Ambulatory Visit: Payer: Self-pay | Admitting: Neurosurgery

## 2019-12-29 ENCOUNTER — Ambulatory Visit (HOSPITAL_COMMUNITY)
Admission: RE | Admit: 2019-12-29 | Discharge: 2019-12-29 | Disposition: A | Discharge status: Home / Self Care | Payer: PPO | Source: Ambulatory Visit | Attending: Obstetrics & Gynecology | Admitting: Obstetrics & Gynecology

## 2019-12-29 DIAGNOSIS — N6489 Other specified disorders of breast: Secondary | ICD-10-CM | POA: Diagnosis not present

## 2019-12-29 DIAGNOSIS — M4312 Spondylolisthesis, cervical region: Secondary | ICD-10-CM

## 2019-12-29 DIAGNOSIS — R928 Other abnormal and inconclusive findings on diagnostic imaging of breast: Secondary | ICD-10-CM | POA: Diagnosis not present

## 2019-12-29 NOTE — Telephone Encounter (Signed)
Returned patient's call and answered additional questions about renewal application. She will complete and drop of to office to provider signature and submission.

## 2019-12-30 ENCOUNTER — Ambulatory Visit: Payer: PPO | Admitting: Rheumatology

## 2020-01-04 ENCOUNTER — Ambulatory Visit (HOSPITAL_COMMUNITY): Payer: PPO

## 2020-01-12 ENCOUNTER — Ambulatory Visit (HOSPITAL_COMMUNITY)
Admission: RE | Admit: 2020-01-12 | Discharge: 2020-01-12 | Disposition: A | Discharge status: Home / Self Care | Payer: PPO | Source: Ambulatory Visit | Attending: Neurosurgery | Admitting: Neurosurgery

## 2020-01-12 ENCOUNTER — Other Ambulatory Visit: Payer: Self-pay

## 2020-01-12 ENCOUNTER — Other Ambulatory Visit: Payer: Self-pay | Admitting: Neurosurgery

## 2020-01-12 DIAGNOSIS — M4312 Spondylolisthesis, cervical region: Secondary | ICD-10-CM

## 2020-01-12 DIAGNOSIS — M542 Cervicalgia: Secondary | ICD-10-CM | POA: Diagnosis not present

## 2020-01-15 ENCOUNTER — Ambulatory Visit: Payer: PPO | Admitting: Physician Assistant

## 2020-01-15 DIAGNOSIS — H02413 Mechanical ptosis of bilateral eyelids: Secondary | ICD-10-CM | POA: Diagnosis not present

## 2020-01-15 DIAGNOSIS — H53483 Generalized contraction of visual field, bilateral: Secondary | ICD-10-CM | POA: Diagnosis not present

## 2020-01-15 DIAGNOSIS — H02834 Dermatochalasis of left upper eyelid: Secondary | ICD-10-CM | POA: Diagnosis not present

## 2020-01-15 DIAGNOSIS — H0279 Other degenerative disorders of eyelid and periocular area: Secondary | ICD-10-CM | POA: Diagnosis not present

## 2020-01-15 DIAGNOSIS — H04123 Dry eye syndrome of bilateral lacrimal glands: Secondary | ICD-10-CM | POA: Diagnosis not present

## 2020-01-15 DIAGNOSIS — H02423 Myogenic ptosis of bilateral eyelids: Secondary | ICD-10-CM | POA: Diagnosis not present

## 2020-01-15 DIAGNOSIS — H02831 Dermatochalasis of right upper eyelid: Secondary | ICD-10-CM | POA: Diagnosis not present

## 2020-01-15 DIAGNOSIS — H57813 Brow ptosis, bilateral: Secondary | ICD-10-CM | POA: Diagnosis not present

## 2020-01-20 ENCOUNTER — Ambulatory Visit: Payer: PPO | Admitting: Physician Assistant

## 2020-01-25 ENCOUNTER — Other Ambulatory Visit: Payer: Self-pay | Admitting: Physician Assistant

## 2020-01-25 NOTE — Telephone Encounter (Signed)
Last Visit: 12/28/2019 Next Visit: 04/05/2020 Labs: 11/03/2019 White cell count is elevated due to prednisone use. Calcium is elevated.  Current Dose per office note on 12/28/2019: Arava 20mg  po qd. Dx: Psoriatic arthritis   Okay to refill Arava?

## 2020-01-26 ENCOUNTER — Other Ambulatory Visit: Payer: Self-pay | Admitting: *Deleted

## 2020-01-26 DIAGNOSIS — M4722 Other spondylosis with radiculopathy, cervical region: Secondary | ICD-10-CM | POA: Diagnosis not present

## 2020-01-26 DIAGNOSIS — M4312 Spondylolisthesis, cervical region: Secondary | ICD-10-CM | POA: Diagnosis not present

## 2020-01-26 MED ORDER — COSENTYX SENSOREADY PEN 150 MG/ML ~~LOC~~ SOAJ
150.0000 mg | SUBCUTANEOUS | 0 refills | Status: DC
Start: 2020-01-26 — End: 2020-08-23

## 2020-01-26 NOTE — Telephone Encounter (Signed)
Last Visit: 12/28/2019 Next Visit: 04/05/2020 Labs: 11/03/2019 White cell count is elevated due to prednisone use. Calcium is elevated.  TB Gold: 09/03/2019 Neg   Current Dose per office note 12/28/2019: Cosentyx 150 mg sq injections every 14 days  DX: Psoriatic arthritis   Okay to refill Cosentyx?

## 2020-01-27 ENCOUNTER — Other Ambulatory Visit: Payer: Self-pay | Admitting: Neurosurgery

## 2020-01-27 ENCOUNTER — Telehealth: Payer: Self-pay | Admitting: Rheumatology

## 2020-01-27 DIAGNOSIS — G8929 Other chronic pain: Secondary | ICD-10-CM

## 2020-01-27 DIAGNOSIS — Z5181 Encounter for therapeutic drug level monitoring: Secondary | ICD-10-CM

## 2020-01-27 MED ORDER — HYDROCODONE-ACETAMINOPHEN 5-325 MG PO TABS
ORAL_TABLET | ORAL | 0 refills | Status: DC
Start: 2020-01-27 — End: 2020-03-03

## 2020-01-27 NOTE — Telephone Encounter (Signed)
Patient request refill on Hydrocodone sent to [facility].

## 2020-01-27 NOTE — Telephone Encounter (Signed)
Last Visit: 12/28/2019 Next Visit: 04/05/2020 UDS: 10/21/2019 Narc Agreement: 10/21/2019  Last Fill: 10/21/2019  Patient advised she is due to update her UDS and narcotic agreement. Patient states she will come to the office tomorrow to update.  Okay to refill Hydrocodone?

## 2020-02-02 ENCOUNTER — Telehealth: Payer: Self-pay | Admitting: *Deleted

## 2020-02-02 NOTE — Telephone Encounter (Signed)
Patient advised we received records from Dr. Arnoldo Morale. Patient discussed surgery and plans to proceed with surgery. Per Hazel Sams, PA-C: Please advise patient to notify us once surgery has been scheduled to discuss her discontinuing her medication and she will need clearance to restart.   Patient advised and will contact the office once she has her surgery dat.

## 2020-02-03 ENCOUNTER — Other Ambulatory Visit: Payer: Self-pay | Admitting: *Deleted

## 2020-02-03 DIAGNOSIS — Z5181 Encounter for therapeutic drug level monitoring: Secondary | ICD-10-CM

## 2020-02-03 DIAGNOSIS — G8929 Other chronic pain: Secondary | ICD-10-CM

## 2020-02-09 ENCOUNTER — Other Ambulatory Visit: Payer: Self-pay | Admitting: Neurosurgery

## 2020-02-11 DIAGNOSIS — Z5181 Encounter for therapeutic drug level monitoring: Secondary | ICD-10-CM | POA: Diagnosis not present

## 2020-02-11 DIAGNOSIS — G8929 Other chronic pain: Secondary | ICD-10-CM | POA: Diagnosis not present

## 2020-02-13 LAB — DRUG MONITOR, PANEL 5, W/CONF, URINE
Amphetamines: NEGATIVE ng/mL (ref <500)
Barbiturates: NEGATIVE ng/mL (ref <300)
Benzodiazepines: NEGATIVE ng/mL (ref <100)
Cocaine Metabolite: NEGATIVE ng/mL (ref <150)
Codeine: NEGATIVE ng/mL (ref <50)
Creatinine: 26.9 mg/dL
Hydrocodone: 333 ng/mL — ABNORMAL HIGH (ref <50)
Hydromorphone: 52 ng/mL — ABNORMAL HIGH (ref <50)
Marijuana Metabolite: NEGATIVE ng/mL (ref <20)
Methadone Metabolite: NEGATIVE ng/mL (ref <100)
Morphine: NEGATIVE ng/mL (ref <50)
Norhydrocodone: 663 ng/mL — ABNORMAL HIGH (ref <50)
Opiates: POSITIVE ng/mL — AB (ref <100)
Oxidant: NEGATIVE ug/mL
Oxycodone: NEGATIVE ng/mL (ref <100)
pH: 5.5 (ref 4.5–9.0)

## 2020-02-13 LAB — DM TEMPLATE

## 2020-02-14 NOTE — Progress Notes (Signed)
Labs are consistent with medication use.

## 2020-02-19 ENCOUNTER — Telehealth: Payer: Self-pay | Admitting: *Deleted

## 2020-02-19 NOTE — Telephone Encounter (Signed)
Patient contacted the office stating she is having surgery on 03/09/2020. Patient is on Arava, Cosentyx, Prednisone and Topamax. Please advise as to when patient should stop medication.

## 2020-02-22 ENCOUNTER — Other Ambulatory Visit: Payer: Self-pay | Admitting: Rheumatology

## 2020-02-22 NOTE — Telephone Encounter (Signed)
Discussed with Dr. Estanislado Pandy.  Recommend holding arava 1 week prior to surgery.  Ideally she should hold cosentyx for 1 month prior to surgery but ok if 14 days prior to since her dosing is 150 mg sq injection once every 14 days.   Surgeon will have to clear her during the postoperative period prior to her resuming these medications.

## 2020-02-22 NOTE — Telephone Encounter (Signed)
Spoke with patient and advised it is Recommend holding arava 1 week prior to surgery. Ideally she should hold cosentyx for 1 month prior to surgery but ok if 14 days prior to since her dosing is 150 mg sq injection once every 14 days.  Surgeon will have to clear her during the postoperative period prior to her resuming these medications. Patient verbalized understanding.

## 2020-02-22 NOTE — Telephone Encounter (Signed)
Last Visit: 12/28/2019 Next Visit: 04/05/2020 Labs: 11/03/2019 White cell count is elevated due to prednisone use. Calcium is elevated.  TB Gold: 09/03/2019 Neg   Current Dose per office note 12/28/2019: Cosentyx 150 mg sq injections every 14 days  DX: Psoriatic arthritis   Okay to refill Cosentyx?

## 2020-02-22 NOTE — Telephone Encounter (Signed)
Patient requesting refill on Cosentyx sent to [facility].

## 2020-02-22 NOTE — Telephone Encounter (Signed)
She is due to update lab work and will be holding cosentyx due to upcoming surgery.

## 2020-02-23 ENCOUNTER — Telehealth: Payer: Self-pay | Admitting: Pharmacist

## 2020-02-23 ENCOUNTER — Telehealth: Payer: Self-pay | Admitting: Pharmacy Technician

## 2020-02-23 NOTE — Telephone Encounter (Addendum)
Received renewal application from [employer] for [employer] patient assistance.  Completed office portion and faxed to [employer].  Initiated new PA (PA lapsed in March 2021) PA call # [phone] (case number not defined until assigned to clinical) Plan phone # [phone]  Mailed patient portion of application to patient today.  Patient has surgery scheduled 03/09/20, and has been instructed to hold her Cosentyx and leflunomide, but having renewal in place will prevent delay in medication access.  Knox Saliva, PharmD, MPH Clinical Pharmacist (Rheumatology and Pulmonology)

## 2020-02-23 NOTE — Telephone Encounter (Signed)
Patient advised we would not be refilling her prescription at this time due to her upcoming surgery. Patient advised she is also due to update labs. Patient states she will come at the beginning of next week to update.

## 2020-02-23 NOTE — Telephone Encounter (Addendum)
Submitted Patient Assistance Application to [place] for [place] along with provider portion, PA and income documents. Will update patient when we receive a response. Fax# [phone] Phone# [phone]  Reinitiated PA. Call reference number is [id number] (case number not defined until assigned to clinical) Plan phone # [phone]

## 2020-03-01 NOTE — Telephone Encounter (Signed)
[employer] required more information regarding OTEZLA. I've faxed signed non-formulary exception documents to them.  Knox Saliva, PharmD, MPH Clinical Pharmacist (Rheumatology and Pulmonology)

## 2020-03-01 NOTE — Telephone Encounter (Signed)
Received a fax from [employer] regarding an approval for [place] patient assistance through 04/01/21.  Phone number: [phone]  Knox Saliva, PharmD, MPH Clinical Pharmacist (Rheumatology and Pulmonology)

## 2020-03-01 NOTE — Telephone Encounter (Addendum)
Received notification from [facility] regarding a prior authorization for [place]. Authorization has been APPROVED from 02/26/20 through 02/25/21.   Will fax to [employer] PAP. Approval letter sent to scan center.  Phone # [phone]  Knox Saliva, PharmD, MPH Clinical Pharmacist (Rheumatology and Pulmonology)

## 2020-03-02 ENCOUNTER — Other Ambulatory Visit: Payer: Self-pay | Admitting: Rheumatology

## 2020-03-02 ENCOUNTER — Other Ambulatory Visit: Payer: Self-pay

## 2020-03-02 ENCOUNTER — Telehealth: Payer: Self-pay | Admitting: Rheumatology

## 2020-03-02 DIAGNOSIS — Z79899 Other long term (current) drug therapy: Secondary | ICD-10-CM | POA: Diagnosis not present

## 2020-03-02 DIAGNOSIS — E7800 Pure hypercholesterolemia, unspecified: Secondary | ICD-10-CM | POA: Diagnosis not present

## 2020-03-02 DIAGNOSIS — M810 Age-related osteoporosis without current pathological fracture: Secondary | ICD-10-CM | POA: Diagnosis not present

## 2020-03-02 DIAGNOSIS — E119 Type 2 diabetes mellitus without complications: Secondary | ICD-10-CM | POA: Diagnosis not present

## 2020-03-02 DIAGNOSIS — E559 Vitamin D deficiency, unspecified: Secondary | ICD-10-CM | POA: Diagnosis not present

## 2020-03-02 DIAGNOSIS — I10 Essential (primary) hypertension: Secondary | ICD-10-CM | POA: Diagnosis not present

## 2020-03-02 DIAGNOSIS — E2749 Other adrenocortical insufficiency: Secondary | ICD-10-CM | POA: Diagnosis not present

## 2020-03-02 HISTORY — PX: LUMBAR LAMINECTOMY: SHX95

## 2020-03-02 NOTE — Telephone Encounter (Signed)
Patient requests refill on Topamax sent to [facility]. Patient was in today for lab draw.

## 2020-03-02 NOTE — Telephone Encounter (Signed)
Received notification from [facility] regarding a prior authorization for [place]. Authorization has been APPROVED through 04/01/21.  Phone : [phone]  Faxed approval to [employer]. Sent approval letter to scan center.  Nothing further needed.  Knox Saliva, PharmD, MPH Clinical Pharmacist (Rheumatology and Pulmonology)

## 2020-03-02 NOTE — Telephone Encounter (Signed)
Patient advised prescription has been sent to the pharmacy.  

## 2020-03-02 NOTE — Telephone Encounter (Signed)
Last Visit: 12/28/2019 Next Visit: 04/05/2020  Okay to refill per Dr. Estanislado Pandy

## 2020-03-03 ENCOUNTER — Telehealth: Payer: Self-pay

## 2020-03-03 LAB — CBC WITH DIFFERENTIAL/PLATELET
Absolute Monocytes: 690 cells/uL (ref 200–950)
Basophils Absolute: 95 cells/uL (ref 0–200)
Basophils Relative: 0.8 %
Eosinophils Absolute: 321 cells/uL (ref 15–500)
Eosinophils Relative: 2.7 %
HCT: 38.3 % (ref 35.0–45.0)
Hemoglobin: 12.8 g/dL (ref 11.7–15.5)
Lymphs Abs: 1190 cells/uL (ref 850–3900)
MCH: 31.8 pg (ref 27.0–33.0)
MCHC: 33.4 g/dL (ref 32.0–36.0)
MCV: 95.3 fL (ref 80.0–100.0)
MPV: 11.5 fL (ref 7.5–12.5)
Monocytes Relative: 5.8 %
Neutro Abs: 9603 cells/uL — ABNORMAL HIGH (ref 1500–7800)
Neutrophils Relative %: 80.7 %
Platelets: 244 10*3/uL (ref 140–400)
RBC: 4.02 10*6/uL (ref 3.80–5.10)
RDW: 12.7 % (ref 11.0–15.0)
Total Lymphocyte: 10 %
WBC: 11.9 10*3/uL — ABNORMAL HIGH (ref 3.8–10.8)

## 2020-03-03 LAB — COMPLETE METABOLIC PANEL WITH GFR
AG Ratio: 1.8 (calc) (ref 1.0–2.5)
ALT: 36 U/L — ABNORMAL HIGH (ref 6–29)
AST: 30 U/L (ref 10–35)
Albumin: 4.1 g/dL (ref 3.6–5.1)
Alkaline phosphatase (APISO): 49 U/L (ref 37–153)
BUN/Creatinine Ratio: 34 (calc) — ABNORMAL HIGH (ref 6–22)
BUN: 27 mg/dL — ABNORMAL HIGH (ref 7–25)
CO2: 23 mmol/L (ref 20–32)
Calcium: 9.8 mg/dL (ref 8.6–10.4)
Chloride: 104 mmol/L (ref 98–110)
Creat: 0.8 mg/dL (ref 0.60–0.93)
GFR, Est African American: 87 mL/min/{1.73_m2} (ref >=60)
GFR, Est Non African American: 75 mL/min/{1.73_m2} (ref >=60)
Globulin: 2.3 g/dL (calc) (ref 1.9–3.7)
Glucose, Bld: 136 mg/dL (ref 65–139)
Potassium: 4.3 mmol/L (ref 3.5–5.3)
Sodium: 136 mmol/L (ref 135–146)
Total Bilirubin: 0.3 mg/dL (ref 0.2–1.2)
Total Protein: 6.4 g/dL (ref 6.1–8.1)

## 2020-03-03 MED ORDER — HYDROCODONE-ACETAMINOPHEN 5-325 MG PO TABS: ORAL_TABLET | ORAL | 0 refills | Status: DC

## 2020-03-03 NOTE — Progress Notes (Signed)
WBC is elevated due to prednisone use.  ALT is mildly elevated.  Please advise avoidance of all NSAIDs, Tylenol and alcohol.

## 2020-03-03 NOTE — Telephone Encounter (Signed)
Last Visit: 12/28/2019 Next Visit: 04/05/2020 UDS: 02/11/2020 Narc Agreement: 02/11/2020  Last Fill: 01/27/2020  Okay to refill Hydrocodone?

## 2020-03-03 NOTE — Telephone Encounter (Signed)
Patient called requesting prescription refill of Hydrocodone to be sent to [facility] in [place]. 

## 2020-03-04 NOTE — Progress Notes (Signed)
[place]a [place], [place]a [place] [address] Phone: [phone] Fax: [phone]  [facility] Dorene Grebe, [place] [address] [place] [address] Phone: [phone] Fax: [phone]      Your procedure is scheduled on Wednesday December 8th.  Report to [facility] Main Entrance "A" at 7:30 A.M., and check in at the Admitting office.  Call this number if you have problems the morning of surgery:  [phone]  Call [phone] if you have any questions prior to your surgery date Monday-Friday 8am-4pm    Remember:  Do not eat or drink anything after midnight the night before your surgery   Take these medicines the morning of surgery with A SIP OF WATER   Apremilast (OTEZLA) 30 MG TABS  azelastine (ASTELIN) 0.1 % nasal spray  gabapentin (NEURONTIN) 300 MG capsule  metoprolol succinate (TOPROL-XL) 50 MG 24 hr tablet  omeprazole (PRILOSEC) 20 MG capsule  potassium chloride SA (K-DUR,KLOR-CON) 20 MEQ tablet  predniSONE (DELTASONE) 5 MG tablet     IF NEEDED  albuterol (PROVENTIL HFA;VENTOLIN HFA) 108 (90 BASE) MCG/ACT inhaler   * please bring your inhaler with you to the hospital on day of surgery  HYDROcodone-acetaminophen (NORCO/VICODIN) 5-325 MG tablet  ipratropium (ATROVENT) 0.06 % nasal spray     If no instructions were given on when to stop your Aspirin please reach out to Dr. Adline Mango office for instruction.    WHAT DO I DO ABOUT MY DIABETES MEDICATION?   Marland Kitchen Do not take oral diabetes medicines (pills) the morning of surgery.  . THE NIGHT BEFORE SURGERY, take _____5______ units of _Levemir_insulin.       . THE MORNING OF SURGERY, take ______5_______ units of _Levemir__insulin.  . The day of surgery, do not take other diabetes injectables, including Byetta (exenatide), Bydureon (exenatide ER), Victoza (liraglutide), or Trulicity (dulaglutide).  . If your CBG is greater than  220 mg/dL, you may take  of your sliding scale (correction) dose of insulin.   HOW TO MANAGE YOUR DIABETES BEFORE AND AFTER SURGERY  Why is it important to control my blood sugar before and after surgery? . Improving blood sugar levels before and after surgery helps healing and can limit problems. . A way of improving blood sugar control is eating a healthy diet by: o  Eating less sugar and carbohydrates o  Increasing activity/exercise o  Talking with your doctor about reaching your blood sugar goals . High blood sugars (greater than 180 mg/dL) can raise your risk of infections and slow your recovery, so you will need to focus on controlling your diabetes during the weeks before surgery. . Make sure that the doctor who takes care of your diabetes knows about your planned surgery including the date and location.  How do I manage my blood sugar before surgery? . Check your blood sugar at least 4 times a day, starting 2 days before surgery, to make sure that the level is not too high or low. . Check your blood sugar the morning of your surgery when you wake up and every 2 hours until you get to the Short Stay unit. o If your blood sugar is less than 70 mg/dL, you will need to treat for low blood sugar: - Do not take insulin. - Treat a low blood sugar (less than 70 mg/dL) with  cup of clear juice (cranberry or apple), 4 glucose tablets, OR glucose gel. - Recheck  blood sugar in 15 minutes after treatment (to make sure it is greater than 70 mg/dL). If your blood sugar is not greater than 70 mg/dL on recheck, call [phone] for further instructions. . Report your blood sugar to the short stay nurse when you get to Short Stay.  . If you are admitted to the hospital after surgery: o Your blood sugar will be checked by the staff and you will probably be given insulin after surgery (instead of oral diabetes medicines) to make sure you have good blood sugar levels. o The goal for blood sugar  control after surgery is 80-180 mg/dL.   As of today, STOP taking any Aspirin (unless otherwise instructed by your surgeon) Aleve, Naproxen, Ibuprofen, Motrin, Advil, Goody's, BC's, all herbal medications, fish oil, and all vitamins.                     Do not wear jewelry, make up, or nail polish            Do not wear lotions, powders, perfumes, or deodorant.            Do not shave 48 hours prior to surgery.              Do not bring valuables to the hospital.            [facility] is not responsible for any belongings or valuables.  Do NOT Smoke (Tobacco/Vaping) or drink Alcohol 24 hours prior to your procedure If you use a CPAP at night, you may bring all equipment for your overnight stay.   Contacts, glasses, dentures or bridgework may not be worn into surgery.      For patients admitted to the hospital, discharge time will be determined by your treatment team.   Patients discharged the day of surgery will not be allowed to drive home, and someone needs to stay with them for 24 hours.    Special instructions:   [place]- Preparing For Surgery  Before surgery, you can play an important role. Because skin is not sterile, your skin needs to be as free of germs as possible. You can reduce the number of germs on your skin by washing with CHG (chlorahexidine gluconate) Soap before surgery.  CHG is an antiseptic cleaner which kills germs and bonds with the skin to continue killing germs even after washing.    Oral Hygiene is also important to reduce your risk of infection.  Remember - BRUSH YOUR TEETH THE MORNING OF SURGERY WITH YOUR REGULAR TOOTHPASTE  Please do not use if you have an allergy to CHG or antibacterial soaps. If your skin becomes reddened/irritated stop using the CHG.  Do not shave (including legs and underarms) for at least 48 hours prior to first CHG shower. It is OK to shave your face.  Please follow these instructions carefully.   1. Shower the NIGHT BEFORE  SURGERY and the MORNING OF SURGERY with CHG Soap.   2. If you chose to wash your hair, wash your hair first as usual with your normal shampoo.  3. After you shampoo, rinse your hair and body thoroughly to remove the shampoo.  4. Use CHG as you would any other liquid soap. You can apply CHG directly to the skin and wash gently with a scrungie or a clean washcloth.   5. Apply the CHG Soap to your body ONLY FROM THE NECK DOWN.  Do not use on open wounds or open sores. Avoid contact  with your eyes, ears, mouth and genitals (private parts). Wash Face and genitals (private parts)  with your normal soap.   6. Wash thoroughly, paying special attention to the area where your surgery will be performed.  7. Thoroughly rinse your body with warm water from the neck down.  8. DO NOT shower/wash with your normal soap after using and rinsing off the CHG Soap.  9. Pat yourself dry with a CLEAN TOWEL.  10. Wear CLEAN PAJAMAS to bed the night before surgery  11. Place CLEAN SHEETS on your bed the night of your first shower and DO NOT SLEEP WITH PETS.   Day of Surgery: Wear Clean/Comfortable clothing the morning of surgery Do not apply any deodorants/lotions.   Remember to brush your teeth WITH YOUR REGULAR TOOTHPASTE.   Please read over the following fact sheets that you were given.

## 2020-03-07 ENCOUNTER — Encounter (HOSPITAL_COMMUNITY): Payer: Self-pay

## 2020-03-07 ENCOUNTER — Encounter (HOSPITAL_COMMUNITY): Payer: PPO

## 2020-03-07 ENCOUNTER — Encounter (HOSPITAL_COMMUNITY)
Admission: RE | Admit: 2020-03-07 | Discharge: 2020-03-07 | Disposition: A | Discharge status: Home / Self Care | Payer: PPO | Source: Ambulatory Visit | Attending: Neurosurgery | Admitting: Neurosurgery

## 2020-03-07 ENCOUNTER — Other Ambulatory Visit: Payer: Self-pay

## 2020-03-07 ENCOUNTER — Other Ambulatory Visit (HOSPITAL_COMMUNITY)
Admission: RE | Admit: 2020-03-07 | Discharge: 2020-03-07 | Disposition: A | Discharge status: Home / Self Care | Payer: PPO | Source: Ambulatory Visit | Attending: Neurosurgery | Admitting: Neurosurgery

## 2020-03-07 DIAGNOSIS — Z20822 Contact with and (suspected) exposure to covid-19: Secondary | ICD-10-CM | POA: Insufficient documentation

## 2020-03-07 DIAGNOSIS — Z01812 Encounter for preprocedural laboratory examination: Secondary | ICD-10-CM | POA: Insufficient documentation

## 2020-03-07 LAB — SARS CORONAVIRUS 2 (TAT 6-24 HRS): SARS Coronavirus 2: NEGATIVE

## 2020-03-07 LAB — TYPE AND SCREEN
ABO/RH(D): AB POS
Antibody Screen: NEGATIVE

## 2020-03-07 LAB — SURGICAL PCR SCREEN
MRSA, PCR: NEGATIVE
Staphylococcus aureus: POSITIVE — AB

## 2020-03-07 LAB — GLUCOSE, CAPILLARY: Glucose-Capillary: 105 mg/dL — ABNORMAL HIGH (ref 70–99)

## 2020-03-07 NOTE — Progress Notes (Signed)
PCP - Dr. [name] Cardiologist - Denies  Chest x-ray - Not indicated EKG - 03/07/20 Stress Test - Denies ECHO - Denies Cardiac Cath - Denies  Sleep Study - Denies  DM - Type II Yes steroid induced Fasting Blood Sugar - CBG @ PAT appt 105 Checks Blood Sugar 1 x week  Aspirin Instructions: Dr. Arnoldo Morale instructed to stop on Wednesday 1st. Last dose Tuesday Nov 30th  COVID TEST- 03/07/20  Anesthesia review: No  Patient denies shortness of breath, fever, cough and chest pain at PAT appointment   All instructions explained to the patient, with a verbal understanding of the material. Patient agrees to go over the instructions while at home for a better understanding. Patient also instructed to self quarantine after being tested for COVID-19. The opportunity to ask questions was provided.

## 2020-03-07 NOTE — Progress Notes (Signed)
Patient needed clarification on consent so reached out to Dr. Arnoldo Morale office and spoke to surgical scheduler [place].  Dr. Arnoldo Morale to call patient and discuss consent/surgery.  Will check back later today for any updated order for surgical consent.

## 2020-03-08 ENCOUNTER — Encounter (HOSPITAL_COMMUNITY): Payer: Self-pay | Admitting: Neurosurgery

## 2020-03-09 ENCOUNTER — Ambulatory Visit (HOSPITAL_COMMUNITY): Payer: PPO

## 2020-03-09 ENCOUNTER — Other Ambulatory Visit: Payer: Self-pay

## 2020-03-09 ENCOUNTER — Ambulatory Visit (HOSPITAL_COMMUNITY): Payer: PPO | Admitting: Certified Registered"

## 2020-03-09 ENCOUNTER — Encounter (HOSPITAL_COMMUNITY)
Admission: RE | Disposition: A | Discharge status: Home / Self Care | Payer: Self-pay | Source: Home / Self Care | Attending: Neurosurgery

## 2020-03-09 ENCOUNTER — Ambulatory Visit (HOSPITAL_COMMUNITY)
Admission: RE | Admit: 2020-03-09 | Discharge: 2020-03-09 | Disposition: A | Discharge status: Home / Self Care | Payer: PPO | Attending: Neurosurgery | Admitting: Neurosurgery

## 2020-03-09 ENCOUNTER — Encounter (HOSPITAL_COMMUNITY): Payer: Self-pay | Admitting: Neurosurgery

## 2020-03-09 DIAGNOSIS — M6281 Muscle weakness (generalized): Secondary | ICD-10-CM | POA: Diagnosis not present

## 2020-03-09 DIAGNOSIS — Z981 Arthrodesis status: Secondary | ICD-10-CM | POA: Diagnosis not present

## 2020-03-09 DIAGNOSIS — I10 Essential (primary) hypertension: Secondary | ICD-10-CM | POA: Diagnosis not present

## 2020-03-09 DIAGNOSIS — M4802 Spinal stenosis, cervical region: Secondary | ICD-10-CM | POA: Insufficient documentation

## 2020-03-09 DIAGNOSIS — Z419 Encounter for procedure for purposes other than remedying health state, unspecified: Secondary | ICD-10-CM

## 2020-03-09 DIAGNOSIS — Z20822 Contact with and (suspected) exposure to covid-19: Secondary | ICD-10-CM | POA: Insufficient documentation

## 2020-03-09 DIAGNOSIS — Z79899 Other long term (current) drug therapy: Secondary | ICD-10-CM | POA: Diagnosis not present

## 2020-03-09 DIAGNOSIS — Z794 Long term (current) use of insulin: Secondary | ICD-10-CM | POA: Insufficient documentation

## 2020-03-09 DIAGNOSIS — Z881 Allergy status to other antibiotic agents status: Secondary | ICD-10-CM | POA: Insufficient documentation

## 2020-03-09 DIAGNOSIS — Z882 Allergy status to sulfonamides status: Secondary | ICD-10-CM | POA: Insufficient documentation

## 2020-03-09 DIAGNOSIS — M4722 Other spondylosis with radiculopathy, cervical region: Secondary | ICD-10-CM | POA: Insufficient documentation

## 2020-03-09 DIAGNOSIS — Z885 Allergy status to narcotic agent status: Secondary | ICD-10-CM | POA: Diagnosis not present

## 2020-03-09 DIAGNOSIS — J45909 Unspecified asthma, uncomplicated: Secondary | ICD-10-CM | POA: Diagnosis not present

## 2020-03-09 DIAGNOSIS — Z7982 Long term (current) use of aspirin: Secondary | ICD-10-CM | POA: Insufficient documentation

## 2020-03-09 DIAGNOSIS — Z888 Allergy status to other drugs, medicaments and biological substances status: Secondary | ICD-10-CM | POA: Diagnosis not present

## 2020-03-09 DIAGNOSIS — M4712 Other spondylosis with myelopathy, cervical region: Secondary | ICD-10-CM | POA: Diagnosis not present

## 2020-03-09 DIAGNOSIS — M4322 Fusion of spine, cervical region: Secondary | ICD-10-CM | POA: Diagnosis not present

## 2020-03-09 HISTORY — PX: ANTERIOR CERVICAL DECOMP/DISCECTOMY FUSION: SHX1161

## 2020-03-09 LAB — GLUCOSE, CAPILLARY
Glucose-Capillary: 162 mg/dL — ABNORMAL HIGH (ref 70–99)
Glucose-Capillary: 182 mg/dL — ABNORMAL HIGH (ref 70–99)
Glucose-Capillary: 229 mg/dL — ABNORMAL HIGH (ref 70–99)

## 2020-03-09 LAB — SARS CORONAVIRUS 2 BY RT PCR (HOSPITAL ORDER, PERFORMED IN ~~LOC~~ HOSPITAL LAB): SARS Coronavirus 2: NEGATIVE

## 2020-03-09 SURGERY — ANTERIOR CERVICAL DECOMPRESSION/DISCECTOMY FUSION 2 LEVEL/HARDWARE REMOVAL
Anesthesia: General | Site: Neck

## 2020-03-09 MED ORDER — ONDANSETRON HCL 4 MG PO TABS: 4.0000 mg | ORAL_TABLET | Freq: Four times a day (QID) | ORAL | Status: DC | PRN

## 2020-03-09 MED ORDER — DEXAMETHASONE SODIUM PHOSPHATE 10 MG/ML IJ SOLN
INTRAMUSCULAR | Status: DC | PRN
  Administered 2020-03-09: 5 mg via INTRAVENOUS

## 2020-03-09 MED ORDER — HYDROCODONE-ACETAMINOPHEN 5-325 MG PO TABS: 1.0000 | ORAL_TABLET | ORAL | 0 refills | Status: DC | PRN

## 2020-03-09 MED ORDER — MENTHOL 3 MG MT LOZG: 1.0000 | LOZENGE | OROMUCOSAL | Status: DC | PRN

## 2020-03-09 MED ORDER — ACETAMINOPHEN 325 MG PO TABS: 650.0000 mg | ORAL_TABLET | ORAL | Status: DC | PRN

## 2020-03-09 MED ORDER — ORAL CARE MOUTH RINSE: 15.0000 mL | Freq: Once | OROMUCOSAL | Status: AC

## 2020-03-09 MED ORDER — AZELASTINE HCL 0.1 % NA SOLN
1.0000 | Freq: Two times a day (BID) | NASAL | Status: DC
  Filled 2020-03-09: qty 30

## 2020-03-09 MED ORDER — GABAPENTIN 300 MG PO CAPS
300.0000 mg | ORAL_CAPSULE | Freq: Once | ORAL | Status: DC
  Filled 2020-03-09: qty 1

## 2020-03-09 MED ORDER — GABAPENTIN 300 MG PO CAPS: 300.0000 mg | ORAL_CAPSULE | Freq: Every day | ORAL | Status: DC | PRN

## 2020-03-09 MED ORDER — CHLORHEXIDINE GLUCONATE CLOTH 2 % EX PADS: 6.0000 | MEDICATED_PAD | Freq: Once | CUTANEOUS | Status: DC

## 2020-03-09 MED ORDER — FENTANYL CITRATE (PF) 100 MCG/2ML IJ SOLN
25.0000 ug | INTRAMUSCULAR | Status: DC | PRN
  Administered 2020-03-09 (×2): 50 ug via INTRAVENOUS

## 2020-03-09 MED ORDER — PANTOPRAZOLE SODIUM 40 MG PO TBEC: 40.0000 mg | DELAYED_RELEASE_TABLET | Freq: Every day | ORAL | Status: DC

## 2020-03-09 MED ORDER — PREDNISONE 10 MG PO TABS: 10.0000 mg | ORAL_TABLET | Freq: Every day | ORAL | Status: DC

## 2020-03-09 MED ORDER — 0.9 % SODIUM CHLORIDE (POUR BTL) OPTIME
TOPICAL | Status: DC | PRN
  Administered 2020-03-09: 1000 mL

## 2020-03-09 MED ORDER — PANTOPRAZOLE SODIUM 40 MG IV SOLR: 40.0000 mg | Freq: Every day | INTRAVENOUS | Status: DC

## 2020-03-09 MED ORDER — VANCOMYCIN HCL IN DEXTROSE 1-5 GM/200ML-% IV SOLN: 1000.0000 mg | INTRAVENOUS | Status: DC

## 2020-03-09 MED ORDER — TOPIRAMATE 100 MG PO TABS: 200.0000 mg | ORAL_TABLET | Freq: Every day | ORAL | Status: DC

## 2020-03-09 MED ORDER — ACETAMINOPHEN 500 MG PO TABS
1000.0000 mg | ORAL_TABLET | Freq: Once | ORAL | Status: DC
  Filled 2020-03-09: qty 2

## 2020-03-09 MED ORDER — CYCLOBENZAPRINE HCL 5 MG PO TABS: 5.0000 mg | ORAL_TABLET | Freq: Four times a day (QID) | ORAL | 0 refills | Status: DC | PRN

## 2020-03-09 MED ORDER — ALBUTEROL SULFATE HFA 108 (90 BASE) MCG/ACT IN AERS: 2.0000 | INHALATION_SPRAY | Freq: Four times a day (QID) | RESPIRATORY_TRACT | Status: DC | PRN

## 2020-03-09 MED ORDER — BACITRACIN ZINC 500 UNIT/GM EX OINT
TOPICAL_OINTMENT | CUTANEOUS | Status: AC
  Filled 2020-03-09: qty 28.35

## 2020-03-09 MED ORDER — POTASSIUM CHLORIDE CRYS ER 20 MEQ PO TBCR: 20.0000 meq | EXTENDED_RELEASE_TABLET | Freq: Every day | ORAL | Status: DC

## 2020-03-09 MED ORDER — FENTANYL CITRATE (PF) 100 MCG/2ML IJ SOLN
INTRAMUSCULAR | Status: AC
  Filled 2020-03-09: qty 2

## 2020-03-09 MED ORDER — ZOLPIDEM TARTRATE 5 MG PO TABS: 5.0000 mg | ORAL_TABLET | Freq: Every evening | ORAL | Status: DC | PRN

## 2020-03-09 MED ORDER — SPIRONOLACTONE 25 MG PO TABS
25.0000 mg | ORAL_TABLET | Freq: Every day | ORAL | Status: DC
  Filled 2020-03-09: qty 1

## 2020-03-09 MED ORDER — LIDOCAINE 2% (20 MG/ML) 5 ML SYRINGE
INTRAMUSCULAR | Status: DC | PRN
  Administered 2020-03-09: 60 mg via INTRAVENOUS

## 2020-03-09 MED ORDER — LOSARTAN POTASSIUM 50 MG PO TABS
100.0000 mg | ORAL_TABLET | Freq: Every day | ORAL | Status: DC
  Administered 2020-03-09: 100 mg via ORAL
  Filled 2020-03-09: qty 2

## 2020-03-09 MED ORDER — DOCUSATE SODIUM 100 MG PO CAPS: 100.0000 mg | ORAL_CAPSULE | Freq: Two times a day (BID) | ORAL | 0 refills | Status: DC

## 2020-03-09 MED ORDER — ACETAMINOPHEN 650 MG RE SUPP: 650.0000 mg | RECTAL | Status: DC | PRN

## 2020-03-09 MED ORDER — CHLORHEXIDINE GLUCONATE 0.12 % MT SOLN
15.0000 mL | Freq: Once | OROMUCOSAL | Status: AC
  Administered 2020-03-09: 15 mL via OROMUCOSAL
  Filled 2020-03-09: qty 15

## 2020-03-09 MED ORDER — CYCLOBENZAPRINE HCL 5 MG PO TABS
5.0000 mg | ORAL_TABLET | Freq: Three times a day (TID) | ORAL | Status: DC | PRN
  Administered 2020-03-09: 5 mg via ORAL

## 2020-03-09 MED ORDER — DOCUSATE SODIUM 100 MG PO CAPS: 100.0000 mg | ORAL_CAPSULE | Freq: Two times a day (BID) | ORAL | Status: DC

## 2020-03-09 MED ORDER — SUGAMMADEX SODIUM 200 MG/2ML IV SOLN
INTRAVENOUS | Status: DC | PRN
  Administered 2020-03-09: 200 mg via INTRAVENOUS

## 2020-03-09 MED ORDER — BACITRACIN ZINC 500 UNIT/GM EX OINT
TOPICAL_OINTMENT | CUTANEOUS | Status: DC | PRN
  Administered 2020-03-09: 1 via TOPICAL

## 2020-03-09 MED ORDER — DEXAMETHASONE SODIUM PHOSPHATE 4 MG/ML IJ SOLN: 4.0000 mg | Freq: Four times a day (QID) | INTRAMUSCULAR | Status: DC

## 2020-03-09 MED ORDER — ACETAMINOPHEN 500 MG PO TABS
1000.0000 mg | ORAL_TABLET | Freq: Four times a day (QID) | ORAL | Status: DC
  Administered 2020-03-09: 1000 mg via ORAL
  Filled 2020-03-09: qty 2

## 2020-03-09 MED ORDER — PROPOFOL 10 MG/ML IV BOLUS
INTRAVENOUS | Status: DC | PRN
  Administered 2020-03-09: 130 mg via INTRAVENOUS

## 2020-03-09 MED ORDER — LACTATED RINGERS IV SOLN: INTRAVENOUS | Status: DC

## 2020-03-09 MED ORDER — VANCOMYCIN HCL IN DEXTROSE 1-5 GM/200ML-% IV SOLN
1000.0000 mg | INTRAVENOUS | Status: AC
  Administered 2020-03-09: 1000 mg via INTRAVENOUS
  Filled 2020-03-09: qty 200

## 2020-03-09 MED ORDER — OXYCODONE HCL 5 MG PO TABS
10.0000 mg | ORAL_TABLET | ORAL | Status: DC | PRN
  Administered 2020-03-09: 10 mg via ORAL
  Filled 2020-03-09: qty 2

## 2020-03-09 MED ORDER — THROMBIN 5000 UNITS EX SOLR: OROMUCOSAL | Status: DC | PRN

## 2020-03-09 MED ORDER — THROMBIN 5000 UNITS EX SOLR
CUTANEOUS | Status: AC
  Filled 2020-03-09: qty 5000

## 2020-03-09 MED ORDER — GABAPENTIN 300 MG PO CAPS
300.0000 mg | ORAL_CAPSULE | Freq: Three times a day (TID) | ORAL | Status: DC
  Administered 2020-03-09: 300 mg via ORAL
  Filled 2020-03-09: qty 1

## 2020-03-09 MED ORDER — FENTANYL CITRATE (PF) 100 MCG/2ML IJ SOLN
INTRAMUSCULAR | Status: DC | PRN
  Administered 2020-03-09: 50 ug via INTRAVENOUS
  Administered 2020-03-09: 100 ug via INTRAVENOUS
  Administered 2020-03-09: 50 ug via INTRAVENOUS

## 2020-03-09 MED ORDER — BUPIVACAINE-EPINEPHRINE 0.5% -1:200000 IJ SOLN
INTRAMUSCULAR | Status: DC | PRN
  Administered 2020-03-09: 10 mL

## 2020-03-09 MED ORDER — ONDANSETRON HCL 4 MG/2ML IJ SOLN
INTRAMUSCULAR | Status: DC | PRN
  Administered 2020-03-09: 4 mg via INTRAVENOUS

## 2020-03-09 MED ORDER — ONDANSETRON HCL 4 MG/2ML IJ SOLN: 4.0000 mg | Freq: Four times a day (QID) | INTRAMUSCULAR | Status: DC | PRN

## 2020-03-09 MED ORDER — ROCURONIUM BROMIDE 10 MG/ML (PF) SYRINGE
PREFILLED_SYRINGE | INTRAVENOUS | Status: DC | PRN
  Administered 2020-03-09: 10 mg via INTRAVENOUS
  Administered 2020-03-09: 20 mg via INTRAVENOUS
  Administered 2020-03-09: 40 mg via INTRAVENOUS

## 2020-03-09 MED ORDER — HYDROCORTISONE NA SUCCINATE PF 100 MG IJ SOLR
50.0000 mg | Freq: Three times a day (TID) | INTRAMUSCULAR | Status: DC
  Administered 2020-03-09: 50 mg via INTRAVENOUS
  Filled 2020-03-09 (×3): qty 1

## 2020-03-09 MED ORDER — BUPIVACAINE-EPINEPHRINE 0.5% -1:200000 IJ SOLN
INTRAMUSCULAR | Status: AC
  Filled 2020-03-09: qty 1

## 2020-03-09 MED ORDER — FENTANYL CITRATE (PF) 250 MCG/5ML IJ SOLN
INTRAMUSCULAR | Status: AC
  Filled 2020-03-09: qty 5

## 2020-03-09 MED ORDER — IPRATROPIUM BROMIDE 0.06 % NA SOLN
2.0000 | Freq: Three times a day (TID) | NASAL | Status: DC
  Filled 2020-03-09: qty 15

## 2020-03-09 MED ORDER — PROPOFOL 10 MG/ML IV BOLUS
INTRAVENOUS | Status: AC
  Filled 2020-03-09: qty 20

## 2020-03-09 MED ORDER — PHENOL 1.4 % MT LIQD: 1.0000 | OROMUCOSAL | Status: DC | PRN

## 2020-03-09 MED ORDER — HYDROCODONE-ACETAMINOPHEN 5-325 MG PO TABS: 1.0000 | ORAL_TABLET | ORAL | Status: DC | PRN

## 2020-03-09 MED ORDER — DEXAMETHASONE 4 MG PO TABS
4.0000 mg | ORAL_TABLET | Freq: Four times a day (QID) | ORAL | Status: DC
  Administered 2020-03-09: 4 mg via ORAL
  Filled 2020-03-09: qty 1

## 2020-03-09 MED ORDER — APREMILAST 30 MG PO TABS: 30.0000 mg | ORAL_TABLET | Freq: Two times a day (BID) | ORAL | Status: DC

## 2020-03-09 MED ORDER — HYDROMORPHONE HCL 1 MG/ML IJ SOLN: 0.5000 mg | INTRAMUSCULAR | Status: DC | PRN

## 2020-03-09 MED ORDER — BISACODYL 10 MG RE SUPP: 10.0000 mg | Freq: Every day | RECTAL | Status: DC | PRN

## 2020-03-09 MED ORDER — ALUM & MAG HYDROXIDE-SIMETH 200-200-20 MG/5ML PO SUSP: 30.0000 mL | Freq: Four times a day (QID) | ORAL | Status: DC | PRN

## 2020-03-09 MED ORDER — OXYCODONE HCL 5 MG PO TABS: 5.0000 mg | ORAL_TABLET | ORAL | Status: DC | PRN

## 2020-03-09 MED ORDER — NORTRIPTYLINE HCL 25 MG PO CAPS
25.0000 mg | ORAL_CAPSULE | Freq: Every day | ORAL | Status: DC
  Filled 2020-03-09: qty 1

## 2020-03-09 MED ORDER — CYCLOBENZAPRINE HCL 10 MG PO TABS
ORAL_TABLET | ORAL | Status: AC
  Filled 2020-03-09: qty 1

## 2020-03-09 SURGICAL SUPPLY — 64 items
BAND RUBBER #18 3X1/16 STRL (MISCELLANEOUS) IMPLANT
BENZOIN TINCTURE PRP APPL 2/3 (GAUZE/BANDAGES/DRESSINGS) ×2 IMPLANT
BIT DRILL NEURO 2X3.1 SFT TUCH (MISCELLANEOUS) ×1 IMPLANT
BLADE SURG 15 STRL LF DISP TIS (BLADE) ×1 IMPLANT
BLADE SURG 15 STRL SS (BLADE) ×2
BLADE ULTRA TIP 2M (BLADE) ×2 IMPLANT
BUR BARREL STRAIGHT FLUTE 4.0 (BURR) ×2 IMPLANT
BUR MATCHSTICK NEURO 3.0 LAGG (BURR) ×2 IMPLANT
CAGE PEEK VISTAS 11X14X6 (Cage) ×2 IMPLANT
CANISTER SUCT 3000ML PPV (MISCELLANEOUS) ×2 IMPLANT
CARTRIDGE OIL MAESTRO DRILL (MISCELLANEOUS) ×1 IMPLANT
COVER MAYO STAND STRL (DRAPES) ×2 IMPLANT
COVER WAND RF STERILE (DRAPES) IMPLANT
DECANTER SPIKE VIAL GLASS SM (MISCELLANEOUS) ×2 IMPLANT
DIFFUSER DRILL AIR PNEUMATIC (MISCELLANEOUS) ×2 IMPLANT
DRAPE LAPAROTOMY 100X72 PEDS (DRAPES) ×2 IMPLANT
DRAPE MICROSCOPE LEICA (MISCELLANEOUS) IMPLANT
DRAPE SURG 17X23 STRL (DRAPES) ×4 IMPLANT
DRILL NEURO 2X3.1 SOFT TOUCH (MISCELLANEOUS) ×2
DRSG OPSITE POSTOP 3X4 (GAUZE/BANDAGES/DRESSINGS) ×2 IMPLANT
DRSG OPSITE POSTOP 4X6 (GAUZE/BANDAGES/DRESSINGS) ×2 IMPLANT
ELECT REM PT RETURN 9FT ADLT (ELECTROSURGICAL) ×2
ELECTRODE REM PT RTRN 9FT ADLT (ELECTROSURGICAL) ×1 IMPLANT
GAUZE 4X4 16PLY RFD (DISPOSABLE) IMPLANT
GLOVE BIO SURGEON STRL SZ 6.5 (GLOVE) ×2 IMPLANT
GLOVE BIO SURGEON STRL SZ8 (GLOVE) ×2 IMPLANT
GLOVE BIO SURGEON STRL SZ8.5 (GLOVE) ×2 IMPLANT
GLOVE BIOGEL PI IND STRL 6.5 (GLOVE) ×1 IMPLANT
GLOVE BIOGEL PI IND STRL 7.5 (GLOVE) ×1 IMPLANT
GLOVE BIOGEL PI IND STRL 8 (GLOVE) ×2 IMPLANT
GLOVE BIOGEL PI INDICATOR 6.5 (GLOVE) ×1
GLOVE BIOGEL PI INDICATOR 7.5 (GLOVE) ×1
GLOVE BIOGEL PI INDICATOR 8 (GLOVE) ×2
GLOVE EXAM NITRILE XL STR (GLOVE) IMPLANT
GLOVE SURG SS PI 7.5 STRL IVOR (GLOVE) ×12 IMPLANT
GOWN STRL REIN 2XL LVL4 (GOWN DISPOSABLE) ×2 IMPLANT
GOWN STRL REUS W/ TWL LRG LVL3 (GOWN DISPOSABLE) ×1 IMPLANT
GOWN STRL REUS W/ TWL XL LVL3 (GOWN DISPOSABLE) ×3 IMPLANT
GOWN STRL REUS W/TWL LRG LVL3 (GOWN DISPOSABLE) ×2
GOWN STRL REUS W/TWL XL LVL3 (GOWN DISPOSABLE) ×6
HEMOSTAT POWDER KIT SURGIFOAM (HEMOSTASIS) ×2 IMPLANT
KIT BASIN OR (CUSTOM PROCEDURE TRAY) ×2 IMPLANT
KIT TURNOVER KIT B (KITS) ×2 IMPLANT
MARKER SKIN DUAL TIP RULER LAB (MISCELLANEOUS) ×2 IMPLANT
NEEDLE HYPO 22GX1.5 SAFETY (NEEDLE) ×2 IMPLANT
NEEDLE SPNL 18GX3.5 QUINCKE PK (NEEDLE) ×2 IMPLANT
NS IRRIG 1000ML POUR BTL (IV SOLUTION) ×2 IMPLANT
OIL CARTRIDGE MAESTRO DRILL (MISCELLANEOUS) ×2
PACK LAMINECTOMY NEURO (CUSTOM PROCEDURE TRAY) ×2 IMPLANT
PEEK S VISTA 7X11X14 (Peek) ×2 IMPLANT
PIN DISTRACTION 14MM (PIN) ×4 IMPLANT
PLATE ANT CERV XTEND 2 LV 28 (Plate) ×2 IMPLANT
PUTTY DBM 2CC CALC GRAN (Putty) ×2 IMPLANT
RASP 3.0MM (RASP) ×2 IMPLANT
SCREW XTD VAR 4.2 SELF TAP 12 (Screw) ×12 IMPLANT
SPONGE INTESTINAL PEANUT (DISPOSABLE) ×4 IMPLANT
SPONGE SURGIFOAM ABS GEL SZ50 (HEMOSTASIS) IMPLANT
STRIP CLOSURE SKIN 1/2X4 (GAUZE/BANDAGES/DRESSINGS) ×2 IMPLANT
SUT VIC AB 0 CT1 27 (SUTURE) ×2
SUT VIC AB 0 CT1 27XBRD ANTBC (SUTURE) ×1 IMPLANT
SUT VIC AB 3-0 SH 8-18 (SUTURE) ×2 IMPLANT
TOWEL GREEN STERILE (TOWEL DISPOSABLE) ×2 IMPLANT
TOWEL GREEN STERILE FF (TOWEL DISPOSABLE) ×2 IMPLANT
WATER STERILE IRR 1000ML POUR (IV SOLUTION) ×2 IMPLANT

## 2020-03-09 NOTE — Anesthesia Postprocedure Evaluation (Signed)
Anesthesia Post Note  Patient: Tonya Hall  Procedure(s) Performed: ANTERIOR CERVICAL DECOMPRESSION/DISCECTOMY FUSION, INTERBODY PROSTHESIS, PLATE SCREWS CERVICAL FIVE-SIX, CERVICAL SIX-SEVEN (N/A Neck)     Patient location during evaluation: PACU Anesthesia Type: General Level of consciousness: awake and alert Pain management: pain level controlled Vital Signs Assessment: post-procedure vital signs reviewed and stable Respiratory status: spontaneous breathing, nonlabored ventilation, respiratory function stable and patient connected to nasal cannula oxygen Cardiovascular status: blood pressure returned to baseline and stable Postop Assessment: no apparent nausea or vomiting Anesthetic complications: no   No complications documented.  Last Vitals:  Vitals:   03/09/20 1300 03/09/20 1344  BP: (!) 153/72 (!) 155/73  Pulse: 83 85  Resp: 12 16  Temp: 36.6 C 36.6 C  SpO2: 98% 99%    Last Pain:  Vitals:   03/09/20 1525  TempSrc:   PainSc: 7                  [name]

## 2020-03-09 NOTE — Op Note (Signed)
Brief history: The patient is a 69 year old white female who has had a previous C3-4 and C4-5 anterior cervical discectomy, fusion and plating for cervical myelopathy.  She also suffered a odontoid fracture with spinal cord injury after a fall.  This was repaired with an anterior odontoid screw.  More recently patient has developed recurrent neck, right shoulder and arm pain consistent with a cervical radiculopathy.  She failed medical management and was worked up with a cervical MRI and cervical x-rays which demonstrated a cervical spondylolisthesis and spondylosis at C5-6 and C6-7.  I discussed the various treatment options with her.  She has decided proceed with surgery.  Preoperative diagnosis: C5-6 spondylosis, C6-7 spondylosis, cervicalgia, cervical radiculopathy, cervical myelopathy  Postoperative diagnosis: The same  Procedure: C5-6 and C6-7 anterior cervical discectomy/decompression; C5-6 and C6-7 interbody arthrodesis with local morcellized autograft bone and Zimmer DBM; insertion of interbody prosthesis at C5-6 and C6-7 (Zimmer peek interbody prosthesis); anterior cervical plating from C5-C7 with globus titanium plate  Surgeon: Dr. Earle Gell  Asst.: Arnetha Massy, NP  Anesthesia: Gen. endotracheal  Estimated blood loss: 100 cc  Drains: None  Complications: None  Description of procedure: The patient was brought to the operating room by the anesthesia team. General endotracheal anesthesia was induced. A roll was placed under the patient's shoulders to keep the neck in the neutral position. The patient's anterior cervical region was then prepared with Betadine scrub and Betadine solution. Sterile drapes were applied.  The area to be incised was then injected with Marcaine with epinephrine solution. I then used a scalpel to make a transverse incision in the patient's left anterior neck. I used the Metzenbaum scissors to dissect through the scar tissue and to divide the platysmal  muscle and then to dissect medial to the sternocleidomastoid muscle, jugular vein, and carotid artery. I carefully dissected down towards the anterior cervical spine identifying the esophagus and retracting it medially. Then using Kitner swabs to clear soft tissue from the anterior cervical spine and expose the caudal aspect of the old plate.  I then used electrocautery to detach the medial border of the longus colli muscle bilaterally from the C5-6 and C6-7 intervertebral disc spaces. I then inserted the Caspar self-retaining retractor underneath the longus colli muscle bilaterally to provide exposure.  We then incised the intervertebral disc at C5-6. We then performed a partial intervertebral discectomy with a pituitary forceps and the Karlin curettes. I then inserted distraction screws into the vertebral bodies at C5 and C6.  I removed the screws from the old plate at C5 to allow placement of the distraction through through the old screw hole. We then distracted the interspace. We then used the high-speed drill to decorticate the vertebral endplates at R9-1, to drill away the remainder of the intervertebral disc, to drill away some posterior spondylosis, and to thin out the posterior longitudinal ligament. I then incised ligament with the arachnoid knife. We then removed the ligament with a Kerrison punches undercutting the vertebral endplates and decompressing the thecal sac. We then performed foraminotomies about the bilateral C6 nerve roots. This completed the decompression at this level.  We then repeated this procedure in analogous fashion at C6-7 decompressing the thecal sac and the bilateral C7 nerve roots.  We now turned our to attention to the interbody fusion. We used the trial spacers to determine the appropriate size for the interbody prosthesis. We then pre-filled prosthesis with a combination of local morcellized autograft bone that we obtained during decompression as well as  Zimmer DBM. We  then inserted the prosthesis into the distracted interspace at C5-6 and C6-7 with. We then removed the distraction screws. There was a good snug fit of the prosthesis in the interspace.  Having completed the fusion we now turned attention to the anterior spinal instrumentation. We used the high-speed drill to drill away some anterior spondylosis at the disc spaces so that the plate lay down flat. We selected the appropriate length titanium anterior cervical plate.  In order to have enough room for the new plate I used a metal cutting bur to drill for small amount of the inferior aspect of the old plate at C5.  We laid it along the anterior aspect of the vertebral bodies from C5-C7. We then drilled 12 mm holes at C5, C6 and C7. We then secured the plate to the vertebral bodies by placing two 12 mm self-tapping screws at C5, C6 and C7. We then obtained intraoperative radiograph. The demonstrating good position of the upper instrumentation, the lower plate and screws looked good in vivo. We therefore secured the screws the plate the locking each cam. This completed the instrumentation.  We then obtained hemostasis using bipolar electrocautery. We irrigated the wound out with bacitracin solution. We then removed the retractor. We inspected the esophagus for any damage. There was none apparent. We then reapproximated patient's platysmal muscle with interrupted 3-0 Vicryl suture. We then reapproximated the subcutaneous tissue with interrupted 3-0 Vicryl suture. The skin was reapproximated with Steri-Strips and benzoin. The wound was then covered with bacitracin ointment. A sterile dressing was applied. The drapes were removed. Patient was subsequently extubated by the anesthesia team and transported to the post anesthesia care unit in stable condition. All sponge instrument and needle counts were reportedly correct at the end of this case.

## 2020-03-09 NOTE — Anesthesia Procedure Notes (Signed)
Procedure Name: Intubation Date/Time: 03/09/2020 9:48 AM Performed by: Babs Bertin, CRNA Pre-anesthesia Checklist: Patient identified, Emergency Drugs available, Suction available and Patient being monitored Patient Re-evaluated:Patient Re-evaluated prior to induction Oxygen Delivery Method: Circle System Utilized Preoxygenation: Pre-oxygenation with 100% oxygen Induction Type: IV induction Ventilation: Mask ventilation without difficulty Laryngoscope Size: Glidescope Grade View: Grade I Tube type: Oral Tube size: 7.0 mm Number of attempts: 1 Airway Equipment and Method: Stylet and Oral airway Placement Confirmation: ETT inserted through vocal cords under direct vision,  positive ETCO2 and breath sounds checked- equal and bilateral Secured at: 21 cm Tube secured with: Tape Dental Injury: Teeth and Oropharynx as per pre-operative assessment

## 2020-03-09 NOTE — Plan of Care (Signed)
Pt and husband given D/C instructions with verbal understanding. Rx's were sent to the pharmacy by MD. Pt's incision is clean and dry with no sign of infection. Pt's IV was removed prior to D/C. Pt D/C'd home via wheelchair per MD order. Pt is stable @ D/C and has no other needs at this time. Hykeem Ojeda, RN 

## 2020-03-09 NOTE — H&P (Signed)
Subjective: The patient is a 69 year old white female with multiple prior neck surgeries.  She has developed recurrent neck and right shoulder and arm pain consistent with a cervical radiculopathy.  She has been worked up with a cervical MRI which demonstrates a cervical spinal listhesis and spondylosis at C5-6 and C6-7.  I discussed the various treatment options with her.  She has decided proceed with surgery.  Past Medical History:  Diagnosis Date  . Asthma    Albuterol in haler prn  . Cataract    immature unsure which eye  . Chronic back pain   . COVID-19 October/November 2020  . Degenerative disk disease    psoriatic  . Diabetes mellitus without complication (HCC)    diet controlled  . Esophageal motility disorder    Non-specific, see modified barium study/speech path, BP  . GERD (gastroesophageal reflux disease)    takes Protonix bid  . Headache 2019  . History of blood transfusion    post c-section  . History of bronchitis   . HTN (hypertension)   . Hyperlipidemia    takes Pravastatin daily  . Lymphocytic colitis 05/26/2010   Responded to Entocort x 3 MOS  . Neuropathy   . Nonallergic rhinitis   . Osteoporosis    gets Boniva every 3 months  . Peripheral edema    takes HCTZ daily  . Pneumonia 02/2019  . Psoriatic arthritis ([place])    Dr. Katherina Right  . Raynaud's disease   . Seasonal allergies   . Shingles 2020  . SUI (stress urinary incontinence, female)   . Urinary urgency     Past Surgical History:  Procedure Laterality Date  . BACK SURGERY  2003  . BIOPSY  04/17/2016   Procedure: BIOPSY;  Surgeon: Danie Binder, MD;  Location: [facility];  Service: Endoscopy;;  random colon bx's  . CERVICAL SPINE SURGERY  09/09/2017  . CESAREAN SECTION  1974, 1978      . COLONOSCOPY  06/19/2008   ELF:YBOFB internal hemorrhoids/mild sigmoin colon diverticulosis  . COLONOSCOPY  2012   Dr. Gala Romney: normal rectum, diverticula, lymphocytic colitis   . COLONOSCOPY WITH PROPOFOL  N/A 04/17/2016   Dr. Oneida Alar: Normal terminal ileum, internal/external hemorrhoids, random colon biopsies consistent with collagenous colitis  . DILATION AND CURETTAGE OF UTERUS  1977   spontaneous abortion  . ESOPHAGOGASTRODUODENOSCOPY  06/19/2008   PZW:CHENIDPO gastritis  . LUMBAR DISC ARTHROPLASTY  7/98  . LUMBAR FUSION  6/03, 11/08, 11/14   L4-5 fusion, L2-3 fusion, L1-2  . ODONTOID SCREW INSERTION N/A 01/27/2018   Procedure: ODONTOID SCREW INSERTION;  Surgeon: Newman Pies, MD;  Location: [facility];  Service: Neurosurgery;  Laterality: N/A;  ODONTOID SCREW INSERTION  . TONSILLECTOMY AND ADENOIDECTOMY    . TUBAL LIGATION      Allergies  Allergen Reactions  . Adalimumab Rash and Other (See Comments)    FEVER, Fast pulse  . Imuran [Azathioprine Sodium] Rash    Denies Airway involvement  . Methotrexate Other (See Comments)    Liver Enzyme elevation - after 1 month of use  . Plaquenil [Hydroxychloroquine Sulfate] Rash    Denies airway involvement.   . Sulfonamide Derivatives Rash    Occurred with Sulfasalazine. Rash only.   . Ceftin [Cefuroxime Axetil] Nausea Only    Upset stomach  . Morphine Nausea And Vomiting    IV morphine caused N and V  After surgery.   Has tolerated Kadian in the past     Social History   Tobacco  Use  . Smoking status: Never Smoker  . Smokeless tobacco: Never Used  Substance Use Topics  . Alcohol use: No    Family History  Problem Relation Age of Onset  . Diabetes Mother   . Pancreatitis Mother   . Arthritis Mother        psoriatic   . Fibromyalgia Mother   . Rheumatic fever Father   . Diabetes Other        grandparents  . Skin cancer Other        grandfather  . Hypertension Other        grandparent  . Congestive Heart Failure Other        grandfather  . Parkinson's disease Other        grandmother  . Colon cancer Maternal Uncle   . Fibromyalgia Sister   . Rheum arthritis Sister   . Diabetes Sister   . Fibromyalgia Sister   . Diabetes  Sister   . Rheum arthritis Sister   . Hypertension Son   . Colon polyps Neg Hx    Prior to Admission medications   Medication Sig Start Date End Date Taking? Authorizing Provider  Apremilast (OTEZLA) 30 MG TABS Take 30 mg by mouth 2 (two) times daily.   Yes [provider]  ascorbic acid (VITAMIN C) 500 MG tablet Take 500 mg by mouth 2 (two) times daily.   Yes [provider]  aspirin 81 MG tablet Take 81 mg by mouth daily.    Yes [provider]  azelastine (ASTELIN) 0.1 % nasal spray Place 1 spray into both nostrils 2 (two) times daily.  08/12/19  Yes [provider]  Calcium Carb-Cholecalciferol (CALCIUM 600 + D PO) Take 1 tablet by mouth 2 (two) times daily.    Yes [provider]  fish oil-omega-3 fatty acids 1000 MG capsule Take 1 g by mouth daily.    Yes [provider]  gabapentin (NEURONTIN) 300 MG capsule Take 300 mg by mouth See admin instructions. Take 300 mg 3 times daily, may take a 4th 300 mg dose as needed for pain 05/05/18  Yes [provider]  Ginger, Zingiber officinalis, (GINGER ROOT) 500 MG CAPS Take 500 mg by mouth daily.    Yes [provider]  Glucosamine Sulfate 1000 MG CAPS Take 2 capsules (2,000 mg total) by mouth daily. 07/03/11  Yes Hensel, Jamal Collin, MD  HYDROcodone-acetaminophen (NORCO/VICODIN) 5-325 MG tablet Take 1 tablet by mouth 2-3 times daily as needed for pain 03/03/20  Yes Deveshwar, Abel Presto, MD  hydrocortisone sodium succinate (SOLU-CORTEF) 100 MG SOLR injection Inject 100 mg into the muscle daily as needed (when unable to keep down prednisone tablet).    Yes [provider]  leflunomide (ARAVA) 20 MG tablet TAKE 1 TABLET BY MOUTH ONCE DAILY. Patient taking differently: Take 20 mg by mouth daily.  01/25/20  Yes Ofilia Neas, PA-C  LEVEMIR FLEXTOUCH 100 UNIT/ML FlexPen Inject 10 Units into the skin at bedtime.  06/09/19  Yes [provider]  losartan (COZAAR) 100 MG tablet  Take 100 mg by mouth daily. 12/24/18  Yes [provider]  LUTEIN PO Take 350 mg by mouth 3 (three) times a week.   Yes [provider]  metoprolol succinate (TOPROL-XL) 50 MG 24 hr tablet Take 1 tablet (50 mg total) by mouth daily. Take with or immediately following a meal. 07/03/11  Yes Hensel, Jamal Collin, MD  Misc Natural Products (TART CHERRY ADVANCED PO) Take 1 tablet  by mouth daily. daily    Yes [provider]  Multiple Vitamin (MULTIVITAMIN) tablet Take 1 tablet by mouth daily.     Yes [provider]  nortriptyline (PAMELOR) 25 MG capsule TAKE (1) CAPSULE BY MOUTH AT BEDTIME. Patient taking differently: Take 25 mg by mouth at bedtime.  12/05/17  Yes Ofilia Neas, PA-C  omeprazole (PRILOSEC) 20 MG capsule TAKE ONE CAPSULE BY MOUTH TWICE DAILY BEFORE MEALS. Patient taking differently: Take 20 mg by mouth daily.  02/04/19  Yes Carlis Stable, NP  potassium chloride SA (K-DUR,KLOR-CON) 20 MEQ tablet Take 20 mEq by mouth daily.    Yes [provider]  predniSONE (DELTASONE) 5 MG tablet TAKE (1) TABLET BY MOUTH EACH MORNING. Patient taking differently: Take 5 mg by mouth daily.  12/17/19  Yes Deveshwar, Abel Presto, MD  Secukinumab (COSENTYX SENSOREADY PEN) 150 MG/ML SOAJ Inject 150 mg into the skin every 14 (fourteen) days. 01/26/20  Yes Deveshwar, Abel Presto, MD  spironolactone (ALDACTONE) 25 MG tablet Take 25 mg by mouth daily.    Yes [provider]  topiramate (TOPAMAX) 200 MG tablet TAKE (1) TABLET BY MOUTH AT BEDTIME. Patient taking differently: Take 200 mg by mouth at bedtime.  03/02/20  Yes Deveshwar, Abel Presto, MD  Turmeric 500 MG CAPS Take 500 mg by mouth at bedtime.    Yes [provider]  albuterol (PROVENTIL HFA;VENTOLIN HFA) 108 (90 BASE) MCG/ACT inhaler Inhale 2 puffs into the lungs every 6 (six) hours as needed for wheezing or shortness of breath.     [provider]  denosumab (PROLIA) 60 MG/ML SOSY injection Inject 60 mg into  the skin every 6 (six) months.    [provider]  ipratropium (ATROVENT) 0.06 % nasal spray Place 2 sprays into both nostrils 3 (three) times daily as needed for rhinitis.  07/19/19   [provider]  Lancets (ONETOUCH DELICA PLUS [id number]) MISC USE TO TEST 3 TIMESUDAILY. 07/01/19   [provider]  Roma Schanz test strip  03/05/18   [provider]  SURE COMFORT INSULIN SYRINGE 31G X 5/16" 0.3 ML MISC  05/09/18   [provider]  SURE COMFORT PEN NEEDLES 31G X 5 MM [place]  05/05/18   [provider]  Glucosamine 500 MG TABS Take 4 tablets by mouth daily.    07/03/11  [provider]  simvastatin (ZOCOR) 40 MG tablet Take 20 mg by mouth at bedtime.   11/19/18  [provider]     Review of Systems  Positive ROS: As above  All other systems have been reviewed and were otherwise negative with the exception of those mentioned in the HPI and as above.  Objective: Vital signs in last 24 hours: Temp:  [98.1 F (36.7 C)] 98.1 F (36.7 C) (12/08 0758) Pulse Rate:  [86] 86 (12/08 0758) Resp:  [18] 18 (12/08 0758) BP: (156)/(62) 156/62 (12/08 0758) SpO2:  [100 %] 100 % (12/08 0758) Weight:  [62.6 kg] 62.6 kg (12/08 0758) Estimated body mass index is 27.87 kg/m as calculated from the following:   Height as of this encounter: 4\' 11"  (1.499 m).   Weight as of this encounter: 62.6 kg.   General Appearance: Alert Head: Normocephalic, without obvious abnormality, atraumatic Eyes: PERRL, conjunctiva/corneas clear, EOM's intact,    Ears: Normal  Throat: Normal  Neck: Limited cervical range of motion.  Her anterior cervical incisions are well-healed. Back: unremarkable Lungs: Clear to auscultation bilaterally, respirations unlabored Heart: Regular rate and rhythm,  no murmur, rub or gallop Abdomen: Soft, non-tender Extremities: Extremities normal, atraumatic, no cyanosis or edema Skin: unremarkable  NEUROLOGIC:   Mental  status: alert and oriented,Motor Exam - grossly normal Sensory Exam - grossly normal Reflexes:  Coordination - grossly normal Gait - grossly normal Balance - grossly normal Cranial Nerves: I: smell Not tested  II: visual acuity  OS: Normal  OD: Normal   II: visual fields Full to confrontation  II: pupils Equal, round, reactive to light  III,VII: ptosis None  III,IV,VI: extraocular muscles  Full ROM  V: mastication Normal  V: facial light touch sensation  Normal  V,VII: corneal reflex  Present  VII: facial muscle function - upper  Normal  VII: facial muscle function - lower Normal  VIII: hearing Not tested  IX: soft palate elevation  Normal  IX,X: gag reflex Present  XI: trapezius strength  5/5  XI: sternocleidomastoid strength 5/5  XI: neck flexion strength  5/5  XII: tongue strength  Normal    Data Review Lab Results  Component Value Date   WBC 11.9 (H) 03/02/2020   HGB 12.8 03/02/2020   HCT 38.3 03/02/2020   MCV 95.3 03/02/2020   PLT 244 03/02/2020   Lab Results  Component Value Date   NA 136 03/02/2020   K 4.3 03/02/2020   CL 104 03/02/2020   CO2 23 03/02/2020   BUN 27 (H) 03/02/2020   CREATININE 0.80 03/02/2020   GLUCOSE 136 03/02/2020   No results found for: INR, PROTIME  Assessment/Plan: Cervical spinal stenosis, cervical spondylosis, cervicalgia, cervical radiculopathy, cervical myelopathy: I have discussed the situation with the patient.  I reviewed her imaging studies with her and pointed out the abnormalities.  We have discussed the various treatment options including surgery.  I have described the surgical treatment option of a C5-6 and C6-7 anterior cervical discectomy, fusion plating with possible removal of old plate.  I have shown her surgical models.  I have given her a surgical pamphlet.  We have discussed the risk, benefits, alternatives, expected postoperative course, and likelihood of achieving our goals with surgery.  I have answered all her  questions.  She has decided to proceed with surgery.   Ophelia Charter 03/09/2020 9:38 AM

## 2020-03-09 NOTE — Progress Notes (Signed)
Orthopedic Tech Progress Note Patient Details:  Tonya Hall 1950/07/21 [id number] Ran into patient while they were getting on elevator and she was talking about her collar not fitting right, followed her to room and tried to apply [place] she had, called [facility] to come out and get patient the ASPEN COLLAR if he could not get the one she has on better Patient ID: Tonya Hall, female   DOB: [birth year], 69 y.o.   MRN: [id number]   Tonya Hall 03/09/2020, 2:16 PM

## 2020-03-09 NOTE — Discharge Instructions (Signed)
Wound Care Keep incision covered and dry for two days.    Do not put any creams, lotions, or ointments on incision. Leave steri-strips on back.  They will fall off by themselves. Activity Walk each and every day, increasing distance each day. No lifting greater than 5 lbs.  Avoid excessive neck motion. No driving for 2 weeks; may ride as a passenger locally. Diet Resume your normal diet.  Return to Work Will be discussed at your follow up appointment. Call Your Doctor If Any of These Occur Redness, drainage, or swelling at the wound.  Temperature greater than 101 degrees. Severe pain not relieved by pain medication. Incision starts to come apart. Follow Up Appt Call today for appointment in 2-3 weeks [phone]) or for problems.  If you have any hardware placed in your spine, you will need an x-ray before your appointment.

## 2020-03-09 NOTE — Progress Notes (Signed)
Orthopedic Tech Progress Note Patient Details:  Tonya Hall 1950/12/10 [id number]  Ortho Devices Type of Ortho Device: Soft collar Ortho Device/Splint Interventions: Ordered   Post Interventions Patient Tolerated: Other (comment) Instructions Provided: Care of [name] 03/09/2020, 2:27 PM

## 2020-03-09 NOTE — Progress Notes (Signed)
Subjective: The patient is alert and pleasant.  She looks well.  Her husband is at bedside.  She is inquiring about going home.  Objective: Vital signs in last 24 hours: Temp:  [97.8 F (36.6 C)-98.3 F (36.8 C)] 97.8 F (36.6 C) (12/08 1344) Pulse Rate:  [83-94] 85 (12/08 1344) Resp:  [11-19] 16 (12/08 1344) BP: (137-158)/(62-75) 155/73 (12/08 1344) SpO2:  [94 %-100 %] 99 % (12/08 1344) Weight:  [62.6 kg] 62.6 kg (12/08 0758) Estimated body mass index is 27.87 kg/m as calculated from the following:   Height as of this encounter: 4\' 11"  (1.499 m).   Weight as of this encounter: 62.6 kg.   Intake/Output from previous day: No intake/output data recorded. Intake/Output this shift: Total I/O In: 1100 [I.V.:1100] Out: 100 [Blood:100]  Physical exam the patient is alert and pleasant.  She is swallowing well.  Her dressing is clean and dry with no hematoma or shift.  She is moving all 4 extremities well.  Lab Results: No results for input(s): WBC, HGB, HCT, PLT in the last 72 hours. BMET No results for input(s): NA, K, CL, CO2, GLUCOSE, BUN, CREATININE, CALCIUM in the last 72 hours.  Studies/Results: DG Cervical Spine 1 View  Result Date: 03/09/2020 CLINICAL DATA:  C5-C7 ACDF EXAM: DG CERVICAL SPINE - 1 VIEW COMPARISON:  12/22/2019 FINDINGS: Remote postoperative changes in the C2 vertebral body across an odontoid fracture. Remote anterior fusion changes from C3-C5. Placement of inferior anterior plate from C5 reportedly to C7. The C6 and C7 vertebral bodies cannot be visualized due to overlying shoulders. IMPRESSION: Intraoperative imaging as above. Electronically Signed   By: Rolm Baptise M.D.   On: 03/09/2020 12:33    Assessment/Plan: The patient is doing well.  She may go home later today.  LOS: 0 days     Ophelia Charter 03/09/2020, 3:20 PM

## 2020-03-09 NOTE — Discharge Summary (Signed)
Physician Discharge Summary     Providing Compassionate, Quality Care - Together    Patient ID: Tonya Hall MRN: [id number] DOB/AGE: 01-Jul-1950 69 y.o.  Admit date: 03/09/2020 Discharge date: 03/09/2020  Admission Diagnoses: Cervical spondylosis with myelopathy and radiculopathy  Discharge Diagnoses:  Active Problems:   Cervical spondylosis with myelopathy and radiculopathy   Discharged Condition: good  Hospital Course: Patient was admitted to 3C07 for observation following ACDF by Dr. Arnoldo Morale on 03/09/2020 . The patient is tolerating food well. She is having no issues with swallowing or complaints of shortness of breath. Her pain is well-controlled with oral pain medication. Her surgical incision is without swelling. Her dressing is clean, dry, and intact. She has ambulated in the hall and voided without issue. She is ready for discharge home.  Consults: None  Significant Diagnostic Studies: radiology: DG Cervical Spine 1 View  Result Date: 03/09/2020 CLINICAL DATA:  C5-C7 ACDF EXAM: DG CERVICAL SPINE - 1 VIEW COMPARISON:  12/22/2019 FINDINGS: Remote postoperative changes in the C2 vertebral body across an odontoid fracture. Remote anterior fusion changes from C3-C5. Placement of inferior anterior plate from C5 reportedly to C7. The C6 and C7 vertebral bodies cannot be visualized due to overlying shoulders. IMPRESSION: Intraoperative imaging as above. Electronically Signed   By: Rolm Baptise M.D.   On: 03/09/2020 12:33     Treatments: surgery: C5-6 and C6-7 anterior cervical discectomy/decompression; C5-6 and C6-7 interbody arthrodesis with local morcellized autograft bone and Zimmer DBM; insertion of interbody prosthesis at C5-6 and C6-7 (Zimmer peek interbody prosthesis); anterior cervical plating from C5-C7 with globus titanium plate.  Discharge Exam: Blood pressure (!) 171/74, pulse 85, temperature 97.6 F (36.4 C), temperature source Oral, resp. rate 16, height 4\' 11"   (1.499 m), weight 62.6 kg, last menstrual period 02/01/1999, SpO2 100 %.   Alert and oriented x 4 PERRLA CN II-XII grossly intact MAE, Strength and sensation intact Incision is covered with Honeycomb dressing and Steri Strips; Dressing is clean, dry, and intact    Disposition: Discharge disposition: 01-Home or Self Care        Allergies as of 03/09/2020      Reactions   Adalimumab Rash, Other (See Comments)   FEVER, Fast pulse   Imuran [azathioprine Sodium] Rash   Denies Airway involvement   Methotrexate Other (See Comments)   Liver Enzyme elevation - after 1 month of use   Plaquenil [hydroxychloroquine Sulfate] Rash   Denies airway involvement.    Sulfonamide Derivatives Rash   Occurred with Sulfasalazine. Rash only.    Ceftin [cefuroxime Axetil] Nausea Only   Upset stomach   Morphine Nausea And Vomiting   IV morphine caused N and V  After surgery.   Has tolerated Kadian in the past       Medication List    TAKE these medications   albuterol 108 (90 Base) MCG/ACT inhaler Commonly known as: VENTOLIN HFA Inhale 2 puffs into the lungs every 6 (six) hours as needed for wheezing or shortness of breath.   ascorbic acid 500 MG tablet Commonly known as: VITAMIN C Take 500 mg by mouth 2 (two) times daily.   aspirin 81 MG tablet Take 81 mg by mouth daily.   azelastine 0.1 % nasal spray Commonly known as: ASTELIN Place 1 spray into both nostrils 2 (two) times daily.   CALCIUM 600 + D PO Take 1 tablet by mouth 2 (two) times daily.   Cosentyx Sensoready Pen 150 MG/ML Soaj Generic drug: Secukinumab Inject 150 mg  into the skin every 14 (fourteen) days.   cyclobenzaprine 5 MG tablet Commonly known as: FLEXERIL Take 1 tablet (5 mg total) by mouth 4 (four) times daily as needed for muscle spasms.   denosumab 60 MG/ML Sosy injection Commonly known as: PROLIA Inject 60 mg into the skin every 6 (six) months.   docusate sodium 100 MG capsule Commonly known as:  COLACE Take 1 capsule (100 mg total) by mouth 2 (two) times daily.   fish oil-omega-3 fatty acids 1000 MG capsule Take 1 g by mouth daily.   gabapentin 300 MG capsule Commonly known as: NEURONTIN Take 300 mg by mouth See admin instructions. Take 300 mg 3 times daily, may take a 4th 300 mg dose as needed for pain   Ginger Root 500 MG Caps Take 500 mg by mouth daily.   Glucosamine Sulfate 1000 MG Caps Take 2 capsules (2,000 mg total) by mouth daily.   HYDROcodone-acetaminophen 5-325 MG tablet Commonly known as: NORCO/VICODIN Take 1 tablet by mouth every 4 (four) hours as needed for moderate pain (post-operative pain). What changed:   how much to take  how to take this  when to take this  reasons to take this  additional instructions   ipratropium 0.06 % nasal spray Commonly known as: ATROVENT Place 2 sprays into both nostrils 3 (three) times daily as needed for rhinitis.   leflunomide 20 MG tablet Commonly known as: ARAVA TAKE 1 TABLET BY MOUTH ONCE DAILY.   Levemir FlexTouch 100 UNIT/ML FlexPen Generic drug: insulin detemir Inject 10 Units into the skin at bedtime.   losartan 100 MG tablet Commonly known as: COZAAR Take 100 mg by mouth daily.   LUTEIN PO Take 350 mg by mouth 3 (three) times a week.   metoprolol succinate 50 MG 24 hr tablet Commonly known as: TOPROL-XL Take 1 tablet (50 mg total) by mouth daily. Take with or immediately following a meal.   multivitamin tablet Take 1 tablet by mouth daily.   nortriptyline 25 MG capsule Commonly known as: PAMELOR TAKE (1) CAPSULE BY MOUTH AT BEDTIME. What changed: See the new instructions.   omeprazole 20 MG capsule Commonly known as: PRILOSEC TAKE ONE CAPSULE BY MOUTH TWICE DAILY BEFORE MEALS. What changed:   how much to take  how to take this  when to take this  additional instructions   OneTouch Delica Plus [id number] Misc USE TO TEST 3 TIMESUDAILY.   OneTouch Verio test strip Generic drug:  glucose blood   Otezla 30 MG Tabs Generic drug: Apremilast Take 30 mg by mouth 2 (two) times daily.   potassium chloride SA 20 MEQ tablet Commonly known as: KLOR-CON Take 20 mEq by mouth daily.   predniSONE 5 MG tablet Commonly known as: DELTASONE TAKE (1) TABLET BY MOUTH EACH MORNING. What changed: See the new instructions.   Solu-CORTEF 100 MG Solr injection Generic drug: hydrocortisone sodium succinate Inject 100 mg into the muscle daily as needed (when unable to keep down prednisone tablet).   spironolactone 25 MG tablet Commonly known as: ALDACTONE Take 25 mg by mouth daily.   Sure Comfort Insulin Syringe 31G X 5/16" 0.3 ML Misc Generic drug: Insulin Syringe-Needle U-100   Sure Comfort Pen Needles 31G X 5 MM Misc Generic drug: Insulin Pen Needle   TART CHERRY ADVANCED PO Take 1 tablet by mouth daily. daily   topiramate 200 MG tablet Commonly known as: TOPAMAX TAKE (1) TABLET BY MOUTH AT BEDTIME. What changed: See the new instructions.  Turmeric 500 MG Caps Take 500 mg by mouth at bedtime.       Follow-up Information    Newman Pies, MD. Schedule an appointment as soon as possible for a visit in 3 week(s).   Specialty: Neurosurgery Contact information: [address] [place] [address] [phone]               Signed: Patricia Nettle 03/09/2020, 6:44 PM

## 2020-03-09 NOTE — Transfer of Care (Signed)
Immediate Anesthesia Transfer of Care Note  Patient: Tonya Hall  Procedure(s) Performed: ANTERIOR CERVICAL DECOMPRESSION/DISCECTOMY FUSION, INTERBODY PROSTHESIS, PLATE SCREWS CERVICAL FIVE-SIX, CERVICAL SIX-SEVEN (N/A Neck)  Patient Location: PACU  Anesthesia Type:General  Level of Consciousness: awake, alert  and oriented  Airway & Oxygen Therapy: Patient Spontanous Breathing  Post-op Assessment: Report given to RN and Post -op Vital signs reviewed and stable  Post vital signs: Reviewed and stable  Last Vitals:  Vitals Value Taken Time  BP 158/74 03/09/20 1215  Temp    Pulse 95 03/09/20 1216  Resp 11 03/09/20 1216  SpO2 97 % 03/09/20 1216  Vitals shown include unvalidated device data.  Last Pain:  Vitals:   03/09/20 0829  TempSrc:   PainSc: 7       Patients Stated Pain Goal: 3 (16/10/96 0454)  Complications: No complications documented.

## 2020-03-09 NOTE — Anesthesia Preprocedure Evaluation (Signed)
Anesthesia Evaluation  Patient identified by MRN, date of birth, ID band Patient awake    Reviewed: Allergy & Precautions, NPO status , Patient's Chart, lab work & pertinent test results  Airway Mallampati: II  TM Distance: >3 FB Neck ROM: Limited    Dental  (+) Dental Advisory Given   Pulmonary asthma ,    breath sounds clear to auscultation       Cardiovascular hypertension,  Rhythm:Regular Rate:Normal     Neuro/Psych  Headaches, Previous cervical fusion    GI/Hepatic   Endo/Other  diabetes  Renal/GU      Musculoskeletal   Abdominal   Peds  Hematology   Anesthesia Other Findings   Reproductive/Obstetrics                             Lab Results  Component Value Date   WBC 11.9 (H) 03/02/2020   HGB 12.8 03/02/2020   HCT 38.3 03/02/2020   MCV 95.3 03/02/2020   PLT 244 03/02/2020   Lab Results  Component Value Date   CREATININE 0.80 03/02/2020   BUN 27 (H) 03/02/2020   NA 136 03/02/2020   K 4.3 03/02/2020   CL 104 03/02/2020   CO2 23 03/02/2020    Anesthesia Physical Anesthesia Plan  ASA: III  Anesthesia Plan: General   Post-op Pain Management:    Induction: Intravenous  PONV Risk Score and Plan: 3 and Dexamethasone, Ondansetron and Treatment may vary due to age or medical condition  Airway Management Planned: Oral ETT and Video Laryngoscope Planned  Additional Equipment: None  Intra-op Plan:   Post-operative Plan: Extubation in OR  Informed Consent: I have reviewed the patients History and Physical, chart, labs and discussed the procedure including the risks, benefits and alternatives for the proposed anesthesia with the patient or authorized representative who has indicated his/her understanding and acceptance.     Dental advisory given  Plan Discussed with: CRNA  Anesthesia Plan Comments:         Anesthesia Quick Evaluation

## 2020-03-09 NOTE — Evaluation (Signed)
Occupational Therapy Evaluation Patient Details Name: Tonya Hall MRN: [id number] DOB: 31-Dec-1950 Today's Date: 03/09/2020    History of Present Illness Pt is a 69 yo female s/p ACDF C5-6, C6-7. PMHx: multiple spinal and neck surgeries, DM, asthma, peripheral edema.   Clinical Impression   Pt PTA: Pt living with spouse and reports independence prior. Pt currently performing figure 4 technique for ADL and independent with mobility. Pt performing dressing routine with assist only to make sure that shirt fit over soft collar. Pt education thoroughly on handout to avoid precautions. Pt able to state precautions and spouse plans to assist at home. Pt does not require continued OT skilled services. OT signing off.     Follow Up Recommendations  No OT follow up    Equipment Recommendations  None recommended by OT    Recommendations for Other Services       Precautions / Restrictions Precautions Precautions: Cervical Precaution Booklet Issued: Yes (comment) Precaution Comments: pt able to state precautions after education; handout provided Required Braces or Orthoses: Cervical Brace Cervical Brace: Soft collar;For comfort Restrictions Weight Bearing Restrictions: No      Mobility Bed Mobility Overal bed mobility: Modified Independent                  Transfers Overall transfer level: Modified independent                    Balance Overall balance assessment: Modified Independent                                         ADL either performed or assessed with clinical judgement   ADL Overall ADL's : At baseline                                       General ADL Comments: Pt performing figure 4 technique for ADL and independent with mobility. Pt performing dressing routine with assist only to make sure that shirt fit over soft collar.     Vision Baseline Vision/History: Wears glasses Wears Glasses: At all times Vision  Assessment?: No apparent visual deficits     Perception     Praxis      Pertinent Vitals/Pain Pain Assessment: 0-10 Pain Score: 0-No pain Pain Location: neck Pain Descriptors / Indicators: Discomfort Pain Intervention(s): Repositioned;Premedicated before session     Hand Dominance Right   Extremity/Trunk Assessment Upper Extremity Assessment Upper Extremity Assessment: Overall WFL for tasks assessed   Lower Extremity Assessment Lower Extremity Assessment: Overall WFL for tasks assessed   Cervical / Trunk Assessment Cervical / Trunk Assessment: Other exceptions Cervical / Trunk Exceptions: s/p cervical sx   Communication Communication Communication: No difficulties   Cognition Arousal/Alertness: Awake/alert Behavior During Therapy: WFL for tasks assessed/performed Overall Cognitive Status: Within Functional Limits for tasks assessed                                     General Comments       Exercises     Shoulder Instructions      Home Living Family/patient expects to be discharged to:: Private residence Living Arrangements: Spouse/significant other Available Help at Discharge: Family;Available 24 hours/day Type of Home: House Home Access: Level  entry     Home Layout: One level     Bathroom Shower/Tub: Occupational psychologist: [place]: Shower seat;Grab bars - tub/shower;Walker - 2 wheels;Cane - single point          Prior Functioning/Environment Level of Independence: Independent                 OT Problem List: Pain      OT Treatment/Interventions:      OT Goals(Current goals can be found in the care plan section) Acute Rehab OT Goals Patient Stated Goal: to go home today OT Goal Formulation: All assessment and education complete, DC therapy Potential to Achieve Goals: Good  OT Frequency:     Barriers to D/C:            Co-evaluation              AM-PAC OT "6 Clicks" Daily  Activity     Outcome Measure Help from another person eating meals?: None Help from another person taking care of personal grooming?: None Help from another person toileting, which includes using toliet, bedpan, or urinal?: None Help from another person bathing (including washing, rinsing, drying)?: None Help from another person to put on and taking off regular upper body clothing?: None Help from another person to put on and taking off regular lower body clothing?: None 6 Click Score: 24   End of Session Nurse Communication: Mobility status  Activity Tolerance: Patient tolerated treatment well Patient left: in chair;with call bell/phone within reach;with family/visitor present  OT Visit Diagnosis: Muscle weakness (generalized) (M62.81);Pain Pain - part of body:  (neck)                Time: 1630-1650 OT Time Calculation (min): 20 min Charges:  OT General Charges $OT Visit: 1 Visit OT Evaluation $OT Eval Moderate Complexity: 1 Mod  Jefferey Pica, OTR/L Acute Rehabilitation Services Pager: [phone] Office: [phone]   Tangia Pinard C 03/09/2020, 5:12 PM

## 2020-03-10 ENCOUNTER — Encounter (HOSPITAL_COMMUNITY): Payer: Self-pay | Admitting: Neurosurgery

## 2020-03-13 ENCOUNTER — Other Ambulatory Visit: Payer: Self-pay | Admitting: Rheumatology

## 2020-03-14 NOTE — Telephone Encounter (Signed)
Last Visit:12/28/2019 Next Visit:04/05/2020  Current Dose per office note on 12/28/2019: not mentioned  Okay to refill Prednisone?

## 2020-03-15 DIAGNOSIS — M810 Age-related osteoporosis without current pathological fracture: Secondary | ICD-10-CM | POA: Diagnosis not present

## 2020-03-23 NOTE — Progress Notes (Signed)
Office Visit Note  Patient: Tonya Hall             Date of Birth: [birth year]           MRN: [id number]             PCP: [name], MD Referring: [name], MD Visit Date: 04/05/2020 Occupation: @GUAROCC @  Subjective:  Pain in multiple joints.   History of Present Illness: Tonya Hall is a 69 y.o. female with history of psoriatic arthritis, psoriasis, osteoarthritis and degenerative disc disease.  She states she developed cold symptoms on last Friday.  She was seen by her PCP her Covid test was negative.  She was placed on prednisone 40 mg a day she will be tapering by 10 mg every 4 days.  She states she was also having severe pain and discomfort in her left trochanteric bursa.  The pain continues to bother her.  She has difficulty sleeping on that side.  She was also having significant swelling in her hands.  She states the prednisone helped the swelling.  She is not sure if [name] is helping her.  She leaves that Cosentyx is not working as much.  She thinks Arava helps her the most.  Patient states she had cervical spine surgery about a month ago and is gradually recovering from it.  Activities of Daily Living:  Patient reports morning stiffness for all day hours.   Patient Denies nocturnal pain.  Difficulty dressing/grooming: Reports Difficulty climbing stairs: Reports Difficulty getting out of chair: Reports Difficulty using hands for taps, buttons, cutlery, and/or writing: Reports  Review of Systems  Constitutional: Positive for fatigue. Negative for night sweats, weight gain and weight loss.  HENT: Positive for mouth dryness. Negative for mouth sores, trouble swallowing, trouble swallowing and nose dryness.   Eyes: Positive for dryness. Negative for pain, redness and visual disturbance.  Respiratory: Negative for cough, shortness of breath and difficulty breathing.   Cardiovascular: Negative for chest pain, palpitations, hypertension, irregular heartbeat and swelling  in legs/feet.  Gastrointestinal: Negative for blood in stool, constipation and diarrhea.  Endocrine: Negative for increased urination.  Genitourinary: Negative for vaginal dryness.  Musculoskeletal: Positive for arthralgias, joint pain, joint swelling and morning stiffness. Negative for myalgias, muscle weakness, muscle tenderness and myalgias.  Skin: Positive for color change. Negative for rash, hair loss, skin tightness, ulcers and sensitivity to sunlight.  Allergic/Immunologic: Negative for susceptible to infections.  Neurological: Negative for dizziness, memory loss, night sweats and weakness.  Hematological: Negative for swollen glands.  Psychiatric/Behavioral: Negative for depressed mood and sleep disturbance. The patient is not nervous/anxious.     PMFS History:  Patient Active Problem List   Diagnosis Date Noted  . Cervical spondylosis with myelopathy and radiculopathy 03/09/2020  . AKI (acute kidney injury) ([place]) 02/07/2019  . Acute respiratory failure with hypoxia ([place]) 02/06/2019  . Acute respiratory disease due to COVID-19 virus 02/06/2019  . Community acquired pneumonia 02/06/2019  . Hyponatremia 02/06/2019  . Asthma   . Diabetes mellitus without complication ([place])   . Closed nondisplaced odontoid fracture with type II morphology ([place]) 01/27/2018  . Odontoid fracture ([place]) 01/24/2018  . Chest pain 11/14/2017  . Transaminitis 06/22/2016  . Collagenous colitis 05/23/2016  . Psoriasis 05/21/2016  . High risk medication use 02/15/2016  . Age-related osteoporosis without current pathological fracture 02/15/2016  . Trochanteric bursitis, left hip 02/15/2016  . Sacroiliitis, not elsewhere classified ([place]) 02/15/2016  . Chronic pain syndrome 02/15/2016  . Abnormal weight  gain 06/25/2012  . Hypertension 07/03/2011  . Hypercholesteremia 07/03/2011  . Psoriatic arthritis ([place]) 07/03/2011  . Osteoarthritis of multiple joints 07/03/2011  . Esophageal motility disorder  02/14/2011  . Cough 02/14/2011  . GERD 05/05/2010    Past Medical History:  Diagnosis Date  . Asthma    Albuterol in haler prn  . Cataract    immature unsure which eye  . Chronic back pain   . COVID-19 October/November 2020  . Degenerative disk disease    psoriatic  . Diabetes mellitus without complication (HCC)    diet controlled  . Esophageal motility disorder    Non-specific, see modified barium study/speech path, BP  . GERD (gastroesophageal reflux disease)    takes Protonix bid  . Headache 2019  . History of blood transfusion    post c-section  . History of bronchitis   . HTN (hypertension)   . Hyperlipidemia    takes Pravastatin daily  . Lymphocytic colitis 05/26/2010   Responded to Entocort x 3 MOS  . Neuropathy   . Nonallergic rhinitis   . Osteoporosis    gets Boniva every 3 months  . Peripheral edema    takes HCTZ daily  . Pneumonia 02/2019  . Psoriatic arthritis ([place])    Dr. Katherina Right  . Raynaud's disease   . Seasonal allergies   . Shingles 2020  . SUI (stress urinary incontinence, female)   . Urinary urgency     Family History  Problem Relation Age of Onset  . Diabetes Mother   . Pancreatitis Mother   . Arthritis Mother        psoriatic   . Fibromyalgia Mother   . Rheumatic fever Father   . Diabetes Other        grandparents  . Skin cancer Other        grandfather  . Hypertension Other        grandparent  . Congestive Heart Failure Other        grandfather  . Parkinson's disease Other        grandmother  . Colon cancer Maternal Uncle   . Fibromyalgia Sister   . Rheum arthritis Sister   . Diabetes Sister   . Fibromyalgia Sister   . Diabetes Sister   . Rheum arthritis Sister   . Hypertension Son   . Colon polyps Neg Hx    Past Surgical History:  Procedure Laterality Date  . ANTERIOR CERVICAL DECOMP/DISCECTOMY FUSION N/A 03/09/2020   Procedure: ANTERIOR CERVICAL DECOMPRESSION/DISCECTOMY FUSION, INTERBODY PROSTHESIS, PLATE SCREWS  CERVICAL FIVE-SIX, CERVICAL SIX-SEVEN;  Surgeon: Newman Pies, MD;  Location: [facility];  Service: Neurosurgery;  Laterality: N/A;  . BACK SURGERY  2003  . BIOPSY  04/17/2016   Procedure: BIOPSY;  Surgeon: Danie Binder, MD;  [facility];  Service: Endoscopy;;  random colon bx's  . CERVICAL SPINE SURGERY  09/09/2017  . CESAREAN SECTION  1974, 1978      . COLONOSCOPY  06/19/2008   [id number] internal hemorrhoids/mild sigmoin colon diverticulosis  . COLONOSCOPY  2012   Dr. Gala Romney: normal rectum, diverticula, lymphocytic colitis   . COLONOSCOPY WITH PROPOFOL N/A 04/17/2016   Dr. Oneida Alar: Normal terminal ileum, internal/external hemorrhoids, random colon biopsies consistent with collagenous colitis  . DILATION AND CURETTAGE OF UTERUS  1977   spontaneous abortion  . ESOPHAGOGASTRODUODENOSCOPY  06/19/2008   [id number] gastritis  . LUMBAR DISC ARTHROPLASTY  7/98  . LUMBAR FUSION  6/03, 11/08, 11/14   L4-5 fusion, L2-3 fusion, L1-2  .  LUMBAR LAMINECTOMY  03/2020  . ODONTOID SCREW INSERTION N/A 01/27/2018   Procedure: ODONTOID SCREW INSERTION;  Surgeon: Newman Pies, MD;  Location: [facility];  Service: Neurosurgery;  Laterality: N/A;  ODONTOID SCREW INSERTION  . TONSILLECTOMY AND ADENOIDECTOMY    . TUBAL LIGATION     Social History   Social History Narrative   ATTENDS [employer]. 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   Immunization History  Administered Date(s) Administered  . Influenza,inj,Quad PF,6+ Mos 01/21/2017  . Influenza-Unspecified 01/30/2017  . Moderna Sars-Covid-2 Vaccination 06/01/2019, 07/02/2019  . Pneumococcal Polysaccharide-23 02/19/2013  . Tdap 09/10/2016     Objective: Vital Signs: BP (!) 144/81 (BP Location: Left Arm, Patient Position: Sitting, Cuff Size: Small)   Pulse (!) 102   Resp 12   Ht 4' 11.5" (1.511 m)   Wt 139 lb (63 kg)   LMP 02/01/1999   BMI 27.60 kg/m    Physical Exam Vitals and nursing note reviewed.  Constitutional:       Appearance: She is well-developed and well-nourished.  HENT:     Head: Normocephalic and atraumatic.  Eyes:     Extraocular Movements: EOM normal.     Conjunctiva/sclera: Conjunctivae normal.  Cardiovascular:     Rate and Rhythm: Normal rate and regular rhythm.     Pulses: Intact distal pulses.     Heart sounds: Normal heart sounds.  Pulmonary:     Effort: Pulmonary effort is normal.     Breath sounds: Normal breath sounds.  Abdominal:     General: Bowel sounds are normal.     Palpations: Abdomen is soft.  Musculoskeletal:     Cervical back: Normal range of motion.  Lymphadenopathy:     Cervical: No cervical adenopathy.  Skin:    General: Skin is warm and dry.     Capillary Refill: Capillary refill takes less than 2 seconds.  Neurological:     Mental Status: She is alert and oriented to person, place, and time.  Psychiatric:        Mood and Affect: Mood and affect normal.        Behavior: Behavior normal.      Musculoskeletal Exam:   did not check cervical range of motion due to recent surgery.  She had discomfort in the lumbar region, SI joints, over left trochanteric bursa.  Shoulder joints, elbow joints and wrist joints with good range of motion.  She has severe PIP and DIP thickening with decreased range of motion.  No synovitis was noted today.  Hip joints and knee joints with good range of motion.  She had no tenderness over ankles or MTPs.  CDAI Exam: CDAI Score: 0.8  Patient Global: 4 mm; Provider Global: 4 mm Swollen: 0 ; Tender: 0  Joint Exam 04/05/2020   No joint exam has been documented for this visit   There is currently no information documented on the homunculus. Go to the Rheumatology activity and complete the homunculus joint exam.  Investigation: No additional findings.  Imaging: DG Cervical Spine 1 View  Result Date: 03/09/2020 CLINICAL DATA:  C5-C7 ACDF EXAM: DG CERVICAL SPINE - 1 VIEW COMPARISON:  12/22/2019 FINDINGS: Remote postoperative changes  in the C2 vertebral body across an odontoid fracture. Remote anterior fusion changes from C3-C5. Placement of inferior anterior plate from C5 reportedly to C7. The C6 and C7 vertebral bodies cannot be visualized due to overlying shoulders. IMPRESSION: Intraoperative imaging as above. Electronically Signed   By: Rolm Baptise M.D.   On: 03/09/2020 12:33  Recent Labs: Lab Results  Component Value Date   WBC 11.9 (H) 03/02/2020   HGB 12.8 03/02/2020   PLT 244 03/02/2020   NA 136 03/02/2020   K 4.3 03/02/2020   CL 104 03/02/2020   CO2 23 03/02/2020   GLUCOSE 136 03/02/2020   BUN 27 (H) 03/02/2020   CREATININE 0.80 03/02/2020   BILITOT 0.3 03/02/2020   ALKPHOS 56 02/10/2019   AST 30 03/02/2020   ALT 36 (H) 03/02/2020   PROT 6.4 03/02/2020   ALBUMIN 2.7 (L) 02/10/2019   CALCIUM 9.8 03/02/2020   GFRAA 87 03/02/2020   QFTBGOLD Negative 05/05/2015   QFTBGOLDPLUS NEGATIVE 09/03/2019    Speciality Comments: MTX- high LFTs, PLQ,SSZ,Imuran, Humira- allergy, Enbrel , Remicade - inadequate response  Procedures:  No procedures performed Allergies: Adalimumab, Imuran [azathioprine sodium], Methotrexate, Plaquenil [hydroxychloroquine sulfate], Sulfonamide derivatives, Ceftin [cefuroxime axetil], and Morphine   Assessment / Plan:     Visit Diagnoses: Psoriatic arthritis (HCC)-she has poorly controlled psoriatic arthritis despite of combination treatment.  She states she has severe pain and swelling in her hands until yesterday when she was placed on prednisone 40 mg p.o. daily.  She believes Cosentyx and [name] are not helping.  She believes only leflunomide helps her.  We have discussed stalls at the last visit.  She wants to try Toltz.  We will apply for Taltz.  Indications adverse contraindications were discussed at length.  Medication counseling:  Baseline Immunosuppressant Therapy Labs TB GOLD Quantiferon TB Gold Latest Ref Rng & Units 09/03/2019  Quantiferon TB Gold Plus NEGATIVE NEGATIVE    Hepatitis Panel   HIV Lab Results  Component Value Date   HIV NON REACTIVE 02/06/2019   Immunoglobulins   SPEP Serum Protein Electrophoresis Latest Ref Rng & Units 03/02/2020  Total Protein 6.1 - 8.1 g/dL 6.4   G6PD No results found for: G6PDH TPMT No results found for: TPMT   Chest Xray: November 18, 2019  Does patient have a history of inflammatory bowel disease? No  Counseled patient that Donnetta Hail is a IL-17 inhibitor that works to reduce pain and inflammation associated with arthritis.  Counseled patient on purpose, proper use, and adverse effects of Taltz. Reviewed the most common adverse effects of infection, inflammatory bowel disease, and allergic reaction. Counseled patient that Donnetta Hail should be held for infection and prior to scheduled surgery.  Counseled patient to avoid live vaccines while on Taltz.  Advised patient to get annual influenza vaccine, pneumococcal vaccine, and Shingrix as indicated.  Reviewed storage information for Taltz.  Reviewed the importance of regular labs while on [place]. Standing orders placed and is to return in 1 month and then every 3 months after initiation.  Provided patient with medication education material and answered all questions.  Patient consented to [place].  Will upload consent into patient's chart.  Will apply for Taltz through patient's insurance and update when we receive a response.  Advised initial injection must be administered in office.  Patient voiced understanding.    Taltz dose will be: For psoriatic arthritis and plaque psoriasis overlap load of 160 mg then 80 mg on weeks 2,4,6,8,10,12 then 80 mg every 28 days  Prescription will be sent to pharmacy pending lab results and insurance approval.  She will be stopping Cosentyx a month prior to [place] injection.  Psoriasis-she had history of aggressive disease but currently does not have many lesions.  High risk medication use - Cosentyx 150 mg sq injections every 14 days, Arava 20mg  po  qd. [name]  30 mg p.o. twice daily.  Pain in both hands-she has severe arthritis in her hands from psoriatic arthritis and osteoarthritis overlap.  Chronic left SI joint pain-she continues to have left SI joint pain.  Trochanteric bursitis, left hip-she had severe left trochanteric bursitis.  She started prednisone yesterday and symptoms are improving.  Primary osteoarthritis of right knee-she states the knee joint discomfort has improved.  Raynaud's syndrome without gangrene-keeping core temperature warm and extremities warm was discussed.  Morton's neuroma of both feet  DDD (degenerative disc disease), cervical-she had cervical spine surgery about a month ago.  DDD (degenerative disc disease), thoracic-chronic pain  Age-related osteoporosis without current pathological fracture - According to the patient her PCP orders her bone density.  She was recently switched from Reclast to Prolia due to having several fragility fractures.  Chronic pain syndrome - hydrocodone 1 tablet by mouth 2-3 times daily as needed for pain.  She is in severe pain due to arthritis.  She is unable to function without hydrocodone.  Essential hypertension-blood pressure is mildly elevated.  Esophageal motility disorder  Senile purpura (HCC)  History of gastroesophageal reflux (GERD)  Adrenal insufficiency (HCC) - Followed by Dr. Chalmers Cater  History of type 2 diabetes mellitus-followed by Dr. Westley Chandler  Collagenous colitis  Orders: No orders of the defined types were placed in this encounter.  No orders of the defined types were placed in this encounter.   Follow-Up Instructions: Return in about 3 months (around 07/04/2020) for Psoriatic arthritis.   Bo Merino, MD  Note - This record has been created using Editor, commissioning.  Chart creation errors have been sought, but may not always  have been located. Such creation errors do not reflect on  the standard of medical care.

## 2020-03-28 ENCOUNTER — Telehealth: Payer: Self-pay | Admitting: Radiology

## 2020-03-28 NOTE — Telephone Encounter (Signed)
Patient called, would like to be scheduled to get an injection as soon as possible. Advised patient Dr. Estanislado Pandy is out this week, but we would call her tomorrow to see when we could get her scheduled.

## 2020-03-29 ENCOUNTER — Other Ambulatory Visit: Payer: Self-pay | Admitting: Neurosurgery

## 2020-03-29 ENCOUNTER — Other Ambulatory Visit (HOSPITAL_COMMUNITY): Payer: Self-pay | Admitting: Neurosurgery

## 2020-03-29 DIAGNOSIS — M4722 Other spondylosis with radiculopathy, cervical region: Secondary | ICD-10-CM | POA: Diagnosis not present

## 2020-03-29 DIAGNOSIS — M5416 Radiculopathy, lumbar region: Secondary | ICD-10-CM

## 2020-03-29 NOTE — Telephone Encounter (Signed)
Patient had appointment already scheduled for Tuesday, 04/06/19 at 1:00 pm.  Patient will get her left hip injection at that appointment.

## 2020-04-04 DIAGNOSIS — R062 Wheezing: Secondary | ICD-10-CM | POA: Diagnosis not present

## 2020-04-04 DIAGNOSIS — R059 Cough, unspecified: Secondary | ICD-10-CM | POA: Diagnosis not present

## 2020-04-05 ENCOUNTER — Ambulatory Visit: Payer: PPO | Admitting: Rheumatology

## 2020-04-05 ENCOUNTER — Other Ambulatory Visit: Payer: Self-pay

## 2020-04-05 ENCOUNTER — Encounter: Payer: Self-pay | Admitting: Rheumatology

## 2020-04-05 ENCOUNTER — Telehealth: Payer: Self-pay

## 2020-04-05 VITALS — BP 144/81 | HR 102 | Resp 12 | Ht 59.5 in | Wt 139.0 lb

## 2020-04-05 DIAGNOSIS — M79641 Pain in right hand: Secondary | ICD-10-CM

## 2020-04-05 DIAGNOSIS — M1711 Unilateral primary osteoarthritis, right knee: Secondary | ICD-10-CM | POA: Diagnosis not present

## 2020-04-05 DIAGNOSIS — M7062 Trochanteric bursitis, left hip: Secondary | ICD-10-CM | POA: Diagnosis not present

## 2020-04-05 DIAGNOSIS — L409 Psoriasis, unspecified: Secondary | ICD-10-CM | POA: Diagnosis not present

## 2020-04-05 DIAGNOSIS — Z79899 Other long term (current) drug therapy: Secondary | ICD-10-CM

## 2020-04-05 DIAGNOSIS — Z8639 Personal history of other endocrine, nutritional and metabolic disease: Secondary | ICD-10-CM

## 2020-04-05 DIAGNOSIS — G894 Chronic pain syndrome: Secondary | ICD-10-CM

## 2020-04-05 DIAGNOSIS — I7300 Raynaud's syndrome without gangrene: Secondary | ICD-10-CM

## 2020-04-05 DIAGNOSIS — K52831 Collagenous colitis: Secondary | ICD-10-CM

## 2020-04-05 DIAGNOSIS — M5030 Other cervical disc degeneration, unspecified cervical region: Secondary | ICD-10-CM | POA: Diagnosis not present

## 2020-04-05 DIAGNOSIS — M79642 Pain in left hand: Secondary | ICD-10-CM

## 2020-04-05 DIAGNOSIS — I10 Essential (primary) hypertension: Secondary | ICD-10-CM

## 2020-04-05 DIAGNOSIS — M810 Age-related osteoporosis without current pathological fracture: Secondary | ICD-10-CM

## 2020-04-05 DIAGNOSIS — M5134 Other intervertebral disc degeneration, thoracic region: Secondary | ICD-10-CM | POA: Diagnosis not present

## 2020-04-05 DIAGNOSIS — M533 Sacrococcygeal disorders, not elsewhere classified: Secondary | ICD-10-CM | POA: Diagnosis not present

## 2020-04-05 DIAGNOSIS — L4050 Arthropathic psoriasis, unspecified: Secondary | ICD-10-CM | POA: Diagnosis not present

## 2020-04-05 DIAGNOSIS — D692 Other nonthrombocytopenic purpura: Secondary | ICD-10-CM

## 2020-04-05 DIAGNOSIS — G8929 Other chronic pain: Secondary | ICD-10-CM

## 2020-04-05 DIAGNOSIS — E2740 Unspecified adrenocortical insufficiency: Secondary | ICD-10-CM

## 2020-04-05 DIAGNOSIS — K224 Dyskinesia of esophagus: Secondary | ICD-10-CM

## 2020-04-05 DIAGNOSIS — Z8719 Personal history of other diseases of the digestive system: Secondary | ICD-10-CM

## 2020-04-05 NOTE — Patient Instructions (Addendum)
Ixekizumab injection What is this medicine? IXEKIZUMAB (ix e KIZ ue mab) is used to treat plaque psoriasis, psoriatic arthritis, ankylosing spondylitis, and active non-radiographic axial spondyloarthritis. This medicine may be used for other purposes; ask your health care provider or pharmacist if you have questions. COMMON BRAND NAME(S): TALTZ What should I tell my health care provider before I take this medicine? They need to know if you have any of these conditions:  immune system problems  infection (especially a viral infection such as chickenpox, cold sores, or herpes)  recently received or are scheduled to receive a vaccine  tuberculosis, a positive skin test for tuberculosis, or have recently been in close contact with someone who has tuberculosis  an unusual or allergic reaction to ixekizumab, other medicines, foods, dyes or preservatives  pregnant or trying to get pregnant  breast-feeding How should I use this medicine? This medicine is for injection under the skin. It may be administered by a healthcare professional in a hospital or clinic setting or at home. If you get this medicine at home, you will be taught how to prepare and give this medicine. Use exactly as directed. Take your medicine at regular intervals. Do not take your medicine more often than directed. It is important that you put your used needles and syringes in a special sharps container. Do not put them in a trash can. If you do not have a sharps container, call your pharmacist or healthcare provider to get one. A special MedGuide will be given to you by the pharmacist with each prescription and refill. Be sure to read this information carefully each time. Talk to your pediatrician regarding the use of this medicine in children. While this drug may be prescribed for children as young as 6 years for selected conditions, precautions do apply. Overdosage: If you think you have taken too much of this medicine contact  a poison control center or emergency room at once. NOTE: This medicine is only for you. Do not share this medicine with others. What if I miss a dose? It is important not to miss your dose. Call your doctor of health care professional if you are unable to keep an appointment. If you give yourself the medicine and you miss a dose, take it as soon as you can. Then be sure to take your next doses on your regular schedule. Do not take double or extra doses. If you have questions about a missed injection, call your health care professional. What may interact with this medicine? Do not take this medicine with any of the following medications:  live virus vaccines This medicine may also interact with the following medications:  cyclosporine  inactivated vaccines  warfarin This list may not describe all possible interactions. Give your health care provider a list of all the medicines, herbs, non-prescription drugs, or dietary supplements you use. Also tell them if you smoke, drink alcohol, or use illegal drugs. Some items may interact with your medicine. What should I watch for while using this medicine? Tell your doctor or healthcare professional if your symptoms do not start to get better or if they get worse. You will be tested for tuberculosis (TB) before you start this medicine. If your doctor prescribes any medicine for TB, you should start taking the TB medicine before starting this medicine. Make sure to finish the full course of TB medicine. Call your doctor or healthcare professional for advice if you get a fever, chills or sore throat, or other symptoms of a  cold or flu. Do not treat yourself. This drug decreases your body's ability to fight infections. Try to avoid being around people who are sick. This medicine can decrease the response to a vaccine. If you need to get vaccinated, tell your healthcare professional if you have received this medicine within the last 6 months. Extra booster  doses may be needed. Talk to your doctor to see if a different vaccination schedule is needed. What side effects may I notice from receiving this medicine? Side effects that you should report to your doctor or health care professional as soon as possible:  allergic reactions like skin rash, itching or hives, swelling of the face, lips, or tongue  signs and symptoms of infection like fever or chills; cough; sore throat; pain or trouble passing urine  signs and symptoms of bowel problems like abdominal pain, diarrhea, blood in the stool, and weight loss  white patches in the mouth or throat  vaginal discharge, itching, or odor in women Side effects that usually do not require medical attention (report to your doctor or health care professional if they continue or are bothersome):  nausea  runny nose  sinus trouble This list may not describe all possible side effects. Call your doctor for medical advice about side effects. You may report side effects to FDA at 1-800-FDA-1088. Where should I keep my medicine? Keep out of the reach of children. Store the prefilled syringe or injection pen in a refrigerator between 2 to 8 degrees C (36 to 46 degrees F). Keep the syringe or the pen in the original carton until ready for use. Protect from light. Do not freeze. Do not shake. Prior to use, remove the syringe or pen from the refrigerator and use within 30 minutes. Throw away any unused medicine after the expiration date on the label. NOTE: This sheet is a summary. It may not cover all possible information. If you have questions about this medicine, talk to your doctor, pharmacist, or health care provider.  2020 Elsevier/Gold Standard (2018-09-02 19:35:08)  Standing Labs We placed an order today for your standing lab work.   Please have your standing labs drawn in March and every 3 months  If possible, please have your labs drawn 2 weeks prior to your appointment so that the provider can discuss  your results at your appointment.  We have open lab daily Monday through Thursday from 8:30-12:30 PM and 1:30-4:30 PM and Friday from 8:30-12:30 PM and 1:30-4:00 PM at the office of Dr. Bo Merino, [facility].   Please be advised, patients with office appointments requiring lab work will take precedents over walk-in lab work.  If possible, please come for your lab work on Monday and Friday afternoons, as you may experience shorter wait times. The office is located at [address] [place], [place], [place] [address] No appointment is necessary.   Labs are drawn by [facility]. Please bring your co-pay at the time of your lab draw.  You may receive a bill from [facility] for your lab work.  If you wish to have your labs drawn at another location, please call the office 24 hours in advance to send orders.  If you have any questions regarding directions or hours of operation,  please call [phone].   As a reminder, please drink plenty of water prior to coming for your lab work. Thanks!  COVID-19 vaccine recommendations:   COVID-19 vaccine is recommended for everyone (unless you are allergic to a vaccine component), even if  you are on a medication that suppresses your immune system.   Do not take Tylenol or any anti-inflammatory medications (NSAIDs) 24 hours prior to the COVID-19 vaccination.   There is no direct evidence about the efficacy of the COVID-19 vaccine in individuals who are on medications that suppress the immune system.   Even if you are fully vaccinated, and you are on any medications that suppress your immune system, please continue to wear a mask, maintain at least six feet social distance and practice hand hygiene.   If you develop a COVID-19 infection, please contact your PCP or our office to determine if you need monoclonal antibody infusion.  The booster vaccine is now available for immunocompromised patients.   Please see the following web sites for  updated information.   [email or web address]

## 2020-04-05 NOTE — Telephone Encounter (Signed)
Please apply for taltz per Dr. Corliss Skains. Consent obtained and sent to the scan center.   Also provided patient with patient assitance application to complete patient portion. (PAP packet started and placed on Devki's desk).   Per Dr. Corliss Skains, patient will need to stop cosentyx 1 month prior to starting taltz, please inform patient.   Thanks!

## 2020-04-05 NOTE — Telephone Encounter (Signed)
Submitted a Prior Authorization request to [facility] for TALTZ autoinjector via Cover My Meds. Will update once we receive a response.   KeyAggie Hacker - PA Case ID: [id number]

## 2020-04-06 NOTE — Telephone Encounter (Signed)
Patient's copay for 1st month supply is $2,564.86. Patient receive patient assistance forms at [facility].

## 2020-04-06 NOTE — Telephone Encounter (Signed)
Received notification from [facility] regarding a prior authorization for TALTZ 80mg /ml Pen. Authorization has been APPROVED from through 04/01/21. Will send copy of approval letter to scan center. Original letter left with [employer] patient assistance application.  Phone # [phone]

## 2020-04-11 ENCOUNTER — Other Ambulatory Visit: Payer: Self-pay

## 2020-04-11 ENCOUNTER — Ambulatory Visit (HOSPITAL_COMMUNITY)
Admission: RE | Admit: 2020-04-11 | Discharge: 2020-04-11 | Disposition: A | Discharge status: Home / Self Care | Payer: PPO | Source: Ambulatory Visit | Attending: Neurosurgery | Admitting: Neurosurgery

## 2020-04-11 DIAGNOSIS — M5416 Radiculopathy, lumbar region: Secondary | ICD-10-CM | POA: Diagnosis not present

## 2020-04-11 DIAGNOSIS — M545 Low back pain, unspecified: Secondary | ICD-10-CM | POA: Diagnosis not present

## 2020-04-12 ENCOUNTER — Ambulatory Visit: Payer: PPO | Admitting: Rheumatology

## 2020-04-12 ENCOUNTER — Telehealth: Payer: Self-pay

## 2020-04-12 VITALS — BP 155/76 | HR 101

## 2020-04-12 DIAGNOSIS — M7061 Trochanteric bursitis, right hip: Secondary | ICD-10-CM | POA: Diagnosis not present

## 2020-04-12 MED ORDER — LIDOCAINE HCL 1 % IJ SOLN
1.0000 mL | INTRAMUSCULAR | Status: AC | PRN
  Administered 2020-04-12: 1 mL

## 2020-04-12 MED ORDER — TRIAMCINOLONE ACETONIDE 40 MG/ML IJ SUSP
40.0000 mg | INTRAMUSCULAR | Status: AC | PRN
  Administered 2020-04-12: 40 mg via INTRA_ARTICULAR

## 2020-04-12 NOTE — Progress Notes (Signed)
   Procedure Note  Patient: Tonya Hall             Date of Birth: May 28, 1950           MRN: [id number]             Visit Date: 04/12/2020  Procedures: Visit Diagnoses:  1. Trochanteric bursitis, right hip   Patient was seen in the office on July 04, 2020.  At the time she was having trochanteric bursitis on the left side.  She states since then the pain has moved to the right side.  She states that trochanteric bursitis on the right side has been very painful for the last 2 days.  She is having difficulty sleeping on the right side.  On my examination she had tenderness on the palpation of her right trochanteric bursa.  She had good range of motion of her right hip joint.  Different treatment options and their side effects were discussed.  Per her request right trochanteric bursa was injected as described below.  IT band stretches were advised.  Large Joint Inj: R greater trochanter on 04/12/2020 3:04 PM Indications: pain Details: 27 G 1.5 in needle, lateral approach  Arthrogram: No  Medications: 40 mg triamcinolone acetonide 40 MG/ML; 1 mL lidocaine 1 % Aspirate: 0 mL Outcome: tolerated well, no immediate complications Procedure, treatment alternatives, risks and benefits explained, specific risks discussed. Consent was given by the patient. Immediately prior to procedure a time out was called to verify the correct patient, procedure, equipment, support staff and site/side marked as required. Patient was prepped and draped in the usual sterile fashion.     Bo Merino, MD

## 2020-04-12 NOTE — Telephone Encounter (Signed)
Spoke with patient and scheduled her for a visit on 04/12/2020 at 2:45 pm.

## 2020-04-12 NOTE — Telephone Encounter (Signed)
I would not be able to do an injection in the lumbar spine.  We can do an injection in the trochanteric area or the SI joint if she is having discomfort in that region.

## 2020-04-12 NOTE — Telephone Encounter (Signed)
Patient called to see if Dr. Estanislado Pandy would give her an injection in her right hip.  Patient states she wasn't able to get her left hip injection last week due to being on a high dose of Prednisone.  Patient states she is down to 10 mg and her blood sugar is 103.  Patient states now her right hip is painful with pins and needles sensation.  Patient states she had an MRI yesterday which showed impingement on L5-S1 which she states could be causing the pain and numbness.

## 2020-04-13 NOTE — Telephone Encounter (Addendum)
Faxed completed patient portion to [employer]. Plan is for patient to change therapy from Cosentyx to [place]. Plan is to stop Cosentyx a month before starting Taltz.   We discussed in depth that the [facility] would replace Cosentyx - there was some confusion about this after her discussion with Dr. Estanislado Pandy. She is in agreement with moving forward with Taltz as new start and re-enrollment for Cosentyx while we wait to hear back from [facility].  Knox Saliva, PharmD, MPH Clinical Pharmacist (Rheumatology and Pulmonology)

## 2020-04-13 NOTE — Telephone Encounter (Signed)
Submitted Patient Assistance Application to [facility] for [place] along with provider portion, signed patient portion, PA and income documents. Will update patient when we receive a response.  Fax# [phone] Phone# [phone]

## 2020-04-14 ENCOUNTER — Telehealth: Payer: Self-pay | Admitting: Rheumatology

## 2020-04-14 NOTE — Telephone Encounter (Signed)
Patient request a refill on Hydrocodone sent to [facility]. °

## 2020-04-14 NOTE — Telephone Encounter (Signed)
Last Visit: 04/05/2020 Next Visit: 07/05/2020 UDS:02/11/2020 Narc Agreement: 02/11/2020  Last Fill: 03/03/2020 Dose per office note on 04/05/2020: hydrocodone 1 tablet by mouth 2-3 times daily as needed for pain.   Okay to refill Hydrocodone?

## 2020-04-15 ENCOUNTER — Telehealth: Payer: Self-pay | Admitting: Rheumatology

## 2020-04-15 MED ORDER — HYDROCODONE-ACETAMINOPHEN 5-325 MG PO TABS: ORAL_TABLET | ORAL | 0 refills | Status: DC

## 2020-04-15 NOTE — Telephone Encounter (Signed)
Patient contacted the office stating she is having severe back pain and has not gotten a response from the doctor who prescribes the gabapentin for her. She was wanting Dr. Estanislado Pandy to make adjustments to her gabapentin. Patient advised as we do not prescribe the medication we can not make any adjustments. Patient advised to be seen in the emergency room or urgent care.

## 2020-04-15 NOTE — Telephone Encounter (Signed)
Received fax that application was missing some information.  [employer], They states they did not receive the patient portion, pages 2 and 4. Refaxed. Will update once we receive a response.

## 2020-04-16 ENCOUNTER — Other Ambulatory Visit: Payer: Self-pay

## 2020-04-16 ENCOUNTER — Encounter (HOSPITAL_COMMUNITY): Payer: Self-pay | Admitting: *Deleted

## 2020-04-16 ENCOUNTER — Emergency Department (HOSPITAL_COMMUNITY)
Admission: EM | Admit: 2020-04-16 | Discharge: 2020-04-16 | Disposition: A | Discharge status: Home / Self Care | Payer: PPO | Attending: Emergency Medicine | Admitting: Emergency Medicine

## 2020-04-16 DIAGNOSIS — E119 Type 2 diabetes mellitus without complications: Secondary | ICD-10-CM | POA: Insufficient documentation

## 2020-04-16 DIAGNOSIS — Z7982 Long term (current) use of aspirin: Secondary | ICD-10-CM | POA: Diagnosis not present

## 2020-04-16 DIAGNOSIS — M5441 Lumbago with sciatica, right side: Secondary | ICD-10-CM | POA: Insufficient documentation

## 2020-04-16 DIAGNOSIS — J45909 Unspecified asthma, uncomplicated: Secondary | ICD-10-CM | POA: Insufficient documentation

## 2020-04-16 DIAGNOSIS — Z8616 Personal history of COVID-19: Secondary | ICD-10-CM | POA: Insufficient documentation

## 2020-04-16 DIAGNOSIS — Z794 Long term (current) use of insulin: Secondary | ICD-10-CM | POA: Diagnosis not present

## 2020-04-16 DIAGNOSIS — M545 Low back pain, unspecified: Secondary | ICD-10-CM | POA: Diagnosis present

## 2020-04-16 DIAGNOSIS — Z79899 Other long term (current) drug therapy: Secondary | ICD-10-CM | POA: Insufficient documentation

## 2020-04-16 DIAGNOSIS — I10 Essential (primary) hypertension: Secondary | ICD-10-CM | POA: Diagnosis not present

## 2020-04-16 DIAGNOSIS — M5431 Sciatica, right side: Secondary | ICD-10-CM

## 2020-04-16 MED ORDER — OXYCODONE-ACETAMINOPHEN 5-325 MG PO TABS: 1.0000 | ORAL_TABLET | Freq: Three times a day (TID) | ORAL | 0 refills | Status: DC | PRN

## 2020-04-16 MED ORDER — DEXAMETHASONE SODIUM PHOSPHATE 10 MG/ML IJ SOLN
10.0000 mg | Freq: Once | INTRAMUSCULAR | Status: AC
  Administered 2020-04-16: 10 mg via INTRAMUSCULAR
  Filled 2020-04-16: qty 1

## 2020-04-16 MED ORDER — PREDNISONE 10 MG (21) PO TBPK: ORAL_TABLET | Freq: Every day | ORAL | 0 refills | Status: DC

## 2020-04-16 MED ORDER — HYDROMORPHONE HCL 1 MG/ML IJ SOLN
0.5000 mg | Freq: Once | INTRAMUSCULAR | Status: AC
  Administered 2020-04-16: 0.5 mg via INTRAMUSCULAR
  Filled 2020-04-16: qty 1

## 2020-04-16 NOTE — ED Provider Notes (Signed)
  Face-to-face evaluation   History: She presents for worsening sciatica pain, right-sided, over the last several days.  Of note she is currently weaning down from a steroid Dosepak, after treatment for a URI causing bronchitis.  She had recent MRI imaging indicating L5 nerve root compression from a disc.  She is being managed by neurosurgery with pending spinal nerve injection  Physical exam: Alert somewhat anxious and restless patient.  She is lucid.  No respiratory distress.  She guards against movement of the right leg secondary to discomfort  Medical screening examination/treatment/procedure(s) were conducted as a shared visit with non-physician practitioner(s) and myself.  I personally evaluated the patient during the encounter    Daleen Bo, MD 04/17/20 [phone]

## 2020-04-16 NOTE — Discharge Instructions (Addendum)
Like we discussed, I prescribed you 2 medications.  First medication is called Percocet.  Please only take this as prescribed.  Discontinue your regular hydrocodone when taking this medication.  Only take it for breakthrough pain.  Do not mix it with alcohol.  Do not operate a motor vehicle after taking it.  The second medication is a refill of your prednisone.  Again, this is a tapered medication.  Continue to monitor your blood sugars at home.  If they become severely elevated please contact your regular doctor and discontinue this medication.  Please return to the emergency department with any new or worsening symptoms that we discussed.  Please follow-up with your neurosurgeon on Monday.  It was a pleasure to meet you.

## 2020-04-16 NOTE — ED Triage Notes (Addendum)
Pt c/o left leg pain from the hip down, worsening since Monday. Pt reports impinging disc at L5-S1. Pt reports she was supposed to be set up for injections for her back but the doctors office hasn't set up the appts yet. Pt reports she is here to discuss what adjustments can be made to her medications to help her with the pain. She attempted to call her doctor yesterday for this but was unable to get them to call her back.

## 2020-04-16 NOTE — ED Provider Notes (Signed)
[facility] EMERGENCY DEPARTMENT Provider Note   CSN: [id number] Arrival date & time: 04/16/20  0825     History Chief Complaint  Patient presents with  . Leg Pain    Tonya Hall is a 70 y.o. female.  HPI Patient is a 70 year old female who presents to the emergency department due to back pain.  Patient notes an extensive history of chronic pain to the cervical and lumbar spine.  Most recently had an anterior cervical decompression/discectomy fusion on December 8.  She is followed by neurosurgery.  Also followed by rheumatology for psoriatic arthritis.  She states that she has been experiencing chronic low back pain that typically radiates down her left leg for very long time.  About 3 months ago she began experiencing pain radiating down her right lower extremity as well.  She states that it began to worsen about 6 days ago.  She had an MRI 6 days ago.  Findings as noted below.  She states that as her pain has continued to worsen, so she switched from taking her chronic hydrocodone to now taking oxycodone from an old rx.  Notes very mild relief.  She was started on a 10-day prednisone taper for bronchitis and is currently finishing this medication.  Reports worsening pain when lying flat or with ambulation.  Also reports tingling that radiates down the right leg.  Denies any significant weakness or falls.  No bowel or bladder incontinence.      Past Medical History:  Diagnosis Date  . Asthma    Albuterol in haler prn  . Cataract    immature unsure which eye  . Chronic back pain   . COVID-19 October/November 2020  . Degenerative disk disease    psoriatic  . Diabetes mellitus without complication (HCC)    diet controlled  . Esophageal motility disorder    Non-specific, see modified barium study/speech path, BP  . GERD (gastroesophageal reflux disease)    takes Protonix bid  . Headache 2019  . History of blood transfusion    post c-section  . History of bronchitis   . HTN  (hypertension)   . Hyperlipidemia    takes Pravastatin daily  . Lymphocytic colitis 05/26/2010   Responded to Entocort x 3 MOS  . Neuropathy   . Nonallergic rhinitis   . Osteoporosis    gets Boniva every 3 months  . Peripheral edema    takes HCTZ daily  . Pneumonia 02/2019  . Psoriatic arthritis ([place])    Dr. Katherina Right  . Raynaud's disease   . Seasonal allergies   . Shingles 2020  . SUI (stress urinary incontinence, female)   . Urinary urgency     Patient Active Problem List   Diagnosis Date Noted  . Cervical spondylosis with myelopathy and radiculopathy 03/09/2020  . AKI (acute kidney injury) ([place]) 02/07/2019  . Acute respiratory failure with hypoxia ([place]) 02/06/2019  . Acute respiratory disease due to COVID-19 virus 02/06/2019  . Community acquired pneumonia 02/06/2019  . Hyponatremia 02/06/2019  . Asthma   . Diabetes mellitus without complication ([place])   . Closed nondisplaced odontoid fracture with type II morphology ([place]) 01/27/2018  . Odontoid fracture ([place]) 01/24/2018  . Chest pain 11/14/2017  . Transaminitis 06/22/2016  . Collagenous colitis 05/23/2016  . Psoriasis 05/21/2016  . High risk medication use 02/15/2016  . Age-related osteoporosis without current pathological fracture 02/15/2016  . Trochanteric bursitis, left hip 02/15/2016  . Sacroiliitis, not elsewhere classified ([place]) 02/15/2016  . Chronic pain syndrome  02/15/2016  . Abnormal weight gain 06/25/2012  . Hypertension 07/03/2011  . Hypercholesteremia 07/03/2011  . Psoriatic arthritis (HCC) 07/03/2011  . Osteoarthritis of multiple joints 07/03/2011  . Esophageal motility disorder 02/14/2011  . Cough 02/14/2011  . GERD 05/05/2010    Past Surgical History:  Procedure Laterality Date  . ANTERIOR CERVICAL DECOMP/DISCECTOMY FUSION N/A 03/09/2020   Procedure: ANTERIOR CERVICAL DECOMPRESSION/DISCECTOMY FUSION, INTERBODY PROSTHESIS, PLATE SCREWS CERVICAL FIVE-SIX, CERVICAL SIX-SEVEN;  Surgeon: Tressie StalkerJenkins,  Jeffrey, MD;  Location: [facility] OR;  Service: Neurosurgery;  Laterality: N/A;  . BACK SURGERY  2003  . BIOPSY  04/17/2016   Procedure: BIOPSY;  Surgeon: West BaliSandi L Fields, MD;  Location: [facility] SUITE;  Service: Endoscopy;;  random colon bx's  . CERVICAL SPINE SURGERY  09/09/2017  . CESAREAN SECTION  1974, 1978      . COLONOSCOPY  06/19/2008   ZOX:[id number]:small internal hemorrhoids/mild sigmoin colon diverticulosis  . COLONOSCOPY  2012   Dr. Jena Gaussourk: normal rectum, diverticula, lymphocytic colitis   . COLONOSCOPY WITH PROPOFOL N/A 04/17/2016   Dr. Darrick PennaFields: Normal terminal ileum, internal/external hemorrhoids, random colon biopsies consistent with collagenous colitis  . DILATION AND CURETTAGE OF UTERUS  1977   spontaneous abortion  . ESOPHAGOGASTRODUODENOSCOPY  06/19/2008   VWU:[id number]:moderate gastritis  . LUMBAR DISC ARTHROPLASTY  7/98  . LUMBAR FUSION  6/03, 11/08, 11/14   L4-5 fusion, L2-3 fusion, L1-2  . LUMBAR LAMINECTOMY  03/2020  . ODONTOID SCREW INSERTION N/A 01/27/2018   Procedure: ODONTOID SCREW INSERTION;  Surgeon: Tressie StalkerJenkins, Jeffrey, MD;  Location[facility] OR;  Service: Neurosurgery;  Laterality: N/A;  ODONTOID SCREW INSERTION  . TONSILLECTOMY AND ADENOIDECTOMY    . TUBAL LIGATION       OB History    Gravida  3   Para  2   Term  2   Preterm      AB  1   Living  2     SAB  0   IAB      Ectopic      Multiple      Live Births              Family History  Problem Relation Age of Onset  . Diabetes Mother   . Pancreatitis Mother   . Arthritis Mother        psoriatic   . Fibromyalgia Mother   . Rheumatic fever Father   . Diabetes Other        grandparents  . Skin cancer Other        grandfather  . Hypertension Other        grandparent  . Congestive Heart Failure Other        grandfather  . Parkinson's disease Other        grandmother  . Colon cancer Maternal Uncle   . Fibromyalgia Sister   . Rheum arthritis Sister   . Diabetes Sister   . Fibromyalgia Sister   . Diabetes  Sister   . Rheum arthritis Sister   . Hypertension Son   . Colon polyps Neg Hx     Social History   Tobacco Use  . Smoking status: Never Smoker  . Smokeless tobacco: Never Used  Vaping Use  . Vaping Use: Never used  Substance Use Topics  . Alcohol use: No  . Drug use: No    Home Medications Prior to Admission medications   Medication Sig Start Date End Date Taking? Authorizing Provider  oxyCODONE-acetaminophen (PERCOCET/ROXICET) 5-325 MG tablet Take 1 tablet by  mouth every 8 (eight) hours as needed for severe pain. 04/16/20  Yes Rayna Sexton, PA-C  predniSONE (STERAPRED UNI-PAK 21 TAB) 10 MG (21) TBPK tablet Take by mouth daily. Take 6 tabs by mouth daily  for 2 days, then 5 tabs for 2 days, then 4 tabs for 2 days, then 3 tabs for 2 days, 2 tabs for 2 days, then 1 tab by mouth daily for 2 days 04/16/20  Yes Rayna Sexton, PA-C  albuterol (PROVENTIL HFA;VENTOLIN HFA) 108 (90 BASE) MCG/ACT inhaler Inhale 2 puffs into the lungs every 6 (six) hours as needed for wheezing or shortness of breath.     [provider]  Apremilast (OTEZLA) 30 MG TABS Take 30 mg by mouth 2 (two) times daily.    [provider]  ascorbic acid (VITAMIN C) 500 MG tablet Take 500 mg by mouth 2 (two) times daily.    [provider]  aspirin 81 MG tablet Take 81 mg by mouth daily.    [provider]  azelastine (ASTELIN) 0.1 % nasal spray Place 1 spray into both nostrils 2 (two) times daily.  08/12/19   [provider]  Calcium Carb-Cholecalciferol (CALCIUM 600 + D PO) Take 1 tablet by mouth 2 (two) times daily.     [provider]  cyclobenzaprine (FLEXERIL) 5 MG tablet Take 1 tablet (5 mg total) by mouth 4 (four) times daily as needed for muscle spasms. 03/09/20   Viona Gilmore D, NP  denosumab (PROLIA) 60 MG/ML SOSY injection Inject 60 mg into the skin every 6 (six) months.    [provider]  doxycycline (VIBRAMYCIN) 100 MG capsule Take 100 mg by  mouth 2 (two) times daily. 04/04/20   [provider]  fish oil-omega-3 fatty acids 1000 MG capsule Take 1 g by mouth daily.    [provider]  gabapentin (NEURONTIN) 300 MG capsule Take 300 mg by mouth See admin instructions. Take 300 mg 3 times daily, may take a 4th 300 mg dose as needed for pain 05/05/18   [provider]  Ginger, Zingiber officinalis, (GINGER ROOT) 500 MG CAPS Take 500 mg by mouth daily.    [provider]  Glucosamine Sulfate 1000 MG CAPS Take 2 capsules (2,000 mg total) by mouth daily. 07/03/11   Zenia Resides, MD  HUMULIN N KWIKPEN 100 UNIT/ML Kiwkpen Inject 10 Units into the skin at bedtime. 03/02/20   [provider]  HYDROcodone-acetaminophen (NORCO/VICODIN) 5-325 MG tablet Take 1 tablet by mouth 2-3 times daily as needed for pain 04/15/20   Bo Merino, MD  hydrocortisone sodium succinate (SOLU-CORTEF) 100 MG SOLR injection Inject 100 mg into the muscle daily as needed (when unable to keep down prednisone tablet).     [provider]  ipratropium (ATROVENT) 0.06 % nasal spray Place 2 sprays into both nostrils 3 (three) times daily as needed for rhinitis.  07/19/19   [provider]  Lancets (ONETOUCH DELICA PLUS 123XX123) MISC USE TO TEST 3 TIMESUDAILY. 07/01/19   [provider]  leflunomide (ARAVA) 20 MG tablet TAKE 1 TABLET BY MOUTH ONCE DAILY. Patient taking differently: Take 20 mg by mouth daily. 01/25/20   Ofilia Neas, PA-C  LEVEMIR FLEXTOUCH 100 UNIT/ML FlexPen Inject 10 Units into the skin at bedtime.  06/09/19   [provider]  losartan (COZAAR) 100 MG tablet Take 100 mg by mouth daily. 12/24/18   [provider]  LUTEIN PO Take 350 mg by mouth 3 (three) times  a week.    [provider]  metoprolol succinate (TOPROL-XL) 50 MG 24 hr tablet Take 1 tablet (50 mg total) by mouth daily. Take with or immediately following a meal. 07/03/11   Hensel, Jamal Collin, MD  Misc  Natural Products (TART CHERRY ADVANCED PO) Take 1 tablet by mouth daily. daily    [provider]  Multiple Vitamin (MULTIVITAMIN) tablet Take 1 tablet by mouth daily.    [provider]  nortriptyline (PAMELOR) 25 MG capsule TAKE (1) CAPSULE BY MOUTH AT BEDTIME. Patient taking differently: Take 25 mg by mouth at bedtime. 12/05/17   Ofilia Neas, PA-C  omeprazole (PRILOSEC) 20 MG capsule TAKE ONE CAPSULE BY MOUTH TWICE DAILY BEFORE MEALS. Patient taking differently: Take 20 mg by mouth 2 (two) times daily before a meal. 02/04/19   Carlis Stable, NP  [facility] VERIO test strip  03/05/18   [provider]  potassium chloride SA (K-DUR,KLOR-CON) 20 MEQ tablet Take 20 mEq by mouth daily.     [provider]  Secukinumab (COSENTYX SENSOREADY PEN) 150 MG/ML SOAJ Inject 150 mg into the skin every 14 (fourteen) days. 01/26/20   Bo Merino, MD  spironolactone (ALDACTONE) 25 MG tablet Take 25 mg by mouth daily.    [provider]  SURE COMFORT INSULIN SYRINGE 31G X 5/16" 0.3 ML MISC  05/09/18   [provider]  SURE COMFORT PEN NEEDLES 31G X 5 MM [place]  05/05/18   [provider]  topiramate (TOPAMAX) 200 MG tablet TAKE (1) TABLET BY MOUTH AT BEDTIME. Patient taking differently: Take 200 mg by mouth at bedtime. 03/02/20   Bo Merino, MD  Turmeric 500 MG CAPS Take 500 mg by mouth at bedtime.    [provider]  Glucosamine 500 MG TABS Take 4 tablets by mouth daily.    07/03/11  [provider]  simvastatin (ZOCOR) 40 MG tablet Take 20 mg by mouth at bedtime.   11/19/18  [provider]    Allergies    Adalimumab, Imuran [azathioprine sodium], Methotrexate, Plaquenil [hydroxychloroquine sulfate], Sulfonamide derivatives, Ceftin [cefuroxime axetil], and Morphine  Review of Systems   Review of Systems  All other systems reviewed and are negative. Ten systems reviewed and are negative for acute change, except as  noted in the HPI.    Physical Exam Updated Vital Signs BP (!) 171/72   Pulse 97   Temp 99 F (37.2 C) (Oral)   Resp 18   Ht 4\' 11"  (1.499 m)   Wt 61.7 kg   LMP 02/01/1999   SpO2 98%   BMI 27.47 kg/m   Physical Exam Vitals and nursing note reviewed.  Constitutional:      General: She is not in acute distress.    Appearance: Normal appearance. She is normal weight. She is not ill-appearing, toxic-appearing or diaphoretic.  HENT:     Head: Normocephalic and atraumatic.     Right Ear: External ear normal.     Left Ear: External ear normal.     Nose: Nose normal.     Mouth/Throat:     Mouth: Mucous membranes are moist.     Pharynx: Oropharynx is clear. No oropharyngeal exudate or posterior oropharyngeal erythema.  Eyes:     Extraocular Movements: Extraocular movements intact.  Cardiovascular:     Rate and Rhythm: Normal rate and regular rhythm.     Pulses: Normal pulses.     Heart sounds: Normal heart sounds. No murmur heard. No friction rub.  No gallop.   Pulmonary:     Effort: Pulmonary effort is normal. No respiratory distress.     Breath sounds: Normal breath sounds. No stridor. No wheezing, rhonchi or rales.  Abdominal:     General: Abdomen is flat.     Tenderness: There is no abdominal tenderness.  Musculoskeletal:        General: Tenderness present. No swelling. Normal range of motion.     Cervical back: Normal range of motion and neck supple. No tenderness.     Comments: No tenderness appreciated along the midline lumbar spine.  Moderate tenderness appreciated with deep palpation along the right gluteal region overlying the sciatic nerve.  Negative straight leg raise.  Negative contralateral straight leg raise.  Skin:    General: Skin is warm and dry.  Neurological:     General: No focal deficit present.     Mental Status: She is alert and oriented to person, place, and time.     Comments: Strength symmetrical and intact in the bilateral lower extremities.  Distal  sensation intact.  Palpable pedal pulses.  Patient able to lie supine and elevate both legs into the air at 45 degrees and maintain.  Psychiatric:        Mood and Affect: Mood normal.        Behavior: Behavior normal.    ED Results / Procedures / Treatments   Labs (all labs ordered are listed, but only abnormal results are displayed) Labs Reviewed - No data to display  EKG None  Radiology No results found.  Procedures Procedures (including critical care time)  Medications Ordered in ED Medications  dexamethasone (DECADRON) injection 10 mg (10 mg Intramuscular Given 04/16/20 0934)  HYDROmorphone (DILAUDID) injection 0.5 mg (0.5 mg Intramuscular Given 04/16/20 0934)   ED Course  I have reviewed the triage vital signs and the nursing notes.  Pertinent labs & imaging results that were available during my care of the patient were reviewed by me and considered in my medical decision making (see chart for details).    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  Has an extensive history of chronic pain in the cervical and lumbar spine.  Is followed by neurosurgery as well as rheumatology. Patient had an MRI 6 days ago.  Findings as noted below which have also reviewed and interpreted:  IMPRESSION: L5 is a transitional vertebra. Pedicle screw fusion extends from L1 through L4.  Small central disc protrusion T12-L1 without significant stenosis  Chronic central left-sided extruded disc fragment L1-2 unchanged.  Disc and facet degeneration L4-5 with moderate subarticular and foraminal stenosis on the right  Small left foraminal disc protrusion L5-S1 with left L5 nerve root Impingement.  Patient appears to be neurovascularly intact on my exam.  No gross deficits.  No saddle anesthesia.  No bowel  or bladder incontinence.  Doubt cauda equina at this time. Pt is ambulatory.   Patient given a dose of IM Dilaudid as well as IM dexamethasone in the emergency department.  Reassessed and she states she is starting to feel a moderate amount of relief.  Vital signs are improving.  We will give her an additional prednisone taper.  Her husband is driving her home.  Will prescribe a very short course of oxycodone for breakthrough pain.  She is on a pain contract and states she will reach out to her regular prescribing provider regarding this change.  Patient appears to not be getting adequate relief from her chronic hydrocodone use.  Patient going to follow-up with neurosurgery on Monday.  I have sent her neurosurgeon a message regarding her visit today.  We discussed return precautions at length as well as symptoms of cauda equina.  Her questions were answered and she was amicable at the time of discharge.  Final Clinical Impression(s) / ED Diagnoses Final diagnoses:  Sciatica of right side   Rx / DC Orders ED Discharge Orders         Ordered    oxyCODONE-acetaminophen (PERCOCET/ROXICET) 5-325 MG tablet  Every 8 hours PRN        04/16/20 0934    predniSONE (STERAPRED UNI-PAK 21 TAB) 10 MG (21) TBPK tablet  Daily        04/16/20 1004           Rayna Sexton, PA-C 04/16/20 1011    Daleen Bo, MD 04/17/20 [phone]

## 2020-04-19 DIAGNOSIS — M48061 Spinal stenosis, lumbar region without neurogenic claudication: Secondary | ICD-10-CM | POA: Diagnosis not present

## 2020-04-19 DIAGNOSIS — I10 Essential (primary) hypertension: Secondary | ICD-10-CM | POA: Diagnosis not present

## 2020-04-19 DIAGNOSIS — Z6829 Body mass index (BMI) 29.0-29.9, adult: Secondary | ICD-10-CM | POA: Diagnosis not present

## 2020-04-19 DIAGNOSIS — M5416 Radiculopathy, lumbar region: Secondary | ICD-10-CM | POA: Diagnosis not present

## 2020-04-20 ENCOUNTER — Other Ambulatory Visit: Payer: Self-pay | Admitting: Neurosurgery

## 2020-04-21 ENCOUNTER — Telehealth: Payer: Self-pay | Admitting: Rheumatology

## 2020-04-21 NOTE — Telephone Encounter (Signed)
Patient states she went to the emergency room on 04/16/2020 and received a prescription for Oxycodone. Patient states she is having severe pain in her sciatica and pain down her left leg. Patient states she given another prednisone dose pack. Patient states Dr. Arnoldo Morale gave her another prescription for Oxycodone when she saw on 04/21/2019. Patient is scheduled for surgery on 05/19/2020.   Patient is on [place] and Cosentyx. Patient will be switching to Taltz. Patient would like to know when she should hold her meds. Please advise.

## 2020-04-21 NOTE — Telephone Encounter (Signed)
Tonya Hall should be held for 1 week prior to the surgery and Cosentyx 1 month prior to the surgery.  She may resume medications 2 weeks after the surgery if cleared by the surgeon.  She should not take hydrocodone until she finishes the prescription for oxycodone.

## 2020-04-21 NOTE — Telephone Encounter (Signed)
Patient advised Tonya Hall should be held for 1 week prior to the surgery and Cosentyx 1 month prior to the surgery.  She may resume medications 2 weeks after the surgery if cleared by the surgeon.  She should not take hydrocodone until she finishes the prescription for oxycodone. Patient expressed understanding.

## 2020-04-21 NOTE — Telephone Encounter (Signed)
Patient calling in reference to new problem with a ruptured disc. Pain went to ER, and got steroids along with pain medication. Patient going to have surgery, and needs to discuss this with you. Please call patient to advise.

## 2020-04-22 NOTE — Telephone Encounter (Signed)
Received notification from [facility] regarding an approval for [place] patient assistance from 04/21/20 to 04/01/21.   Phone number: [phone]  They will fax approval letter to the office. 1st shipment will be set up in the next 2-3 business days.

## 2020-04-22 NOTE — Telephone Encounter (Signed)
Called patient that we received confirmation of her Taltz approval through patient assistance. Advised that pharmacy will be reaching out to schedule shipment. I discussed with her that she could let company know to hold the medicine's shipment until she calls them post-op. I will touch base with her after her surgery on 05/19/20. Will have to wait for 2 weeks and cleared by surgeon before starting [place]. Patient verbalized understanding.  She plans to hold her Cosentyx dose that's due next week. She asked if she could provide [place] with her last pen so she didn't have to discard it - advised that we unfortunately cannot collect medications from patients to be used for other patients. She verbalized understanding.  Last CBC/CMP 03/02/21. WBC was slightly elevated 2/2 prednisone use. ALT mildly elevated.  TB gold due 09/02/20.  Will f/u in February post-op to schedule Taltz new start.  Knox Saliva, PharmD, MPH Clinical Pharmacist (Rheumatology and Pulmonology)

## 2020-04-28 DIAGNOSIS — L304 Erythema intertrigo: Secondary | ICD-10-CM | POA: Diagnosis not present

## 2020-04-28 DIAGNOSIS — R202 Paresthesia of skin: Secondary | ICD-10-CM | POA: Diagnosis not present

## 2020-04-28 DIAGNOSIS — D2272 Melanocytic nevi of left lower limb, including hip: Secondary | ICD-10-CM | POA: Diagnosis not present

## 2020-04-28 DIAGNOSIS — D225 Melanocytic nevi of trunk: Secondary | ICD-10-CM | POA: Diagnosis not present

## 2020-04-28 DIAGNOSIS — M79672 Pain in left foot: Secondary | ICD-10-CM | POA: Diagnosis not present

## 2020-04-28 DIAGNOSIS — B353 Tinea pedis: Secondary | ICD-10-CM | POA: Diagnosis not present

## 2020-04-28 DIAGNOSIS — M79671 Pain in right foot: Secondary | ICD-10-CM | POA: Diagnosis not present

## 2020-04-28 DIAGNOSIS — Z86018 Personal history of other benign neoplasm: Secondary | ICD-10-CM | POA: Diagnosis not present

## 2020-04-28 DIAGNOSIS — L821 Other seborrheic keratosis: Secondary | ICD-10-CM | POA: Diagnosis not present

## 2020-04-28 DIAGNOSIS — L578 Other skin changes due to chronic exposure to nonionizing radiation: Secondary | ICD-10-CM | POA: Diagnosis not present

## 2020-05-08 ENCOUNTER — Other Ambulatory Visit: Payer: Self-pay | Admitting: Physician Assistant

## 2020-05-09 ENCOUNTER — Ambulatory Visit (HOSPITAL_COMMUNITY): Payer: PPO | Attending: Neurosurgery | Admitting: Physical Therapy

## 2020-05-09 ENCOUNTER — Encounter (HOSPITAL_COMMUNITY): Payer: Self-pay | Admitting: Physical Therapy

## 2020-05-09 ENCOUNTER — Other Ambulatory Visit: Payer: Self-pay

## 2020-05-09 DIAGNOSIS — R2689 Other abnormalities of gait and mobility: Secondary | ICD-10-CM | POA: Insufficient documentation

## 2020-05-09 DIAGNOSIS — M6281 Muscle weakness (generalized): Secondary | ICD-10-CM | POA: Diagnosis not present

## 2020-05-09 DIAGNOSIS — M545 Low back pain, unspecified: Secondary | ICD-10-CM | POA: Diagnosis not present

## 2020-05-09 DIAGNOSIS — R29898 Other symptoms and signs involving the musculoskeletal system: Secondary | ICD-10-CM | POA: Diagnosis not present

## 2020-05-09 NOTE — Therapy (Signed)
[facility] [address] [place], [place], [address] Phone: [phone]   Fax:  [phone]  Physical Therapy Evaluation  Patient Details  Name: Tonya Hall MRN: [id number] Date of Birth: May 08, 1950 Referring Provider (PT): Newman Pies MD   Encounter Date: 05/09/2020   PT End of Session - 05/09/20 1435    Visit Number 1    Number of Visits 8    Date for PT Re-Evaluation 06/06/20    Authorization Type [employer] (no auth no visit limit)    PT Start Time 1435    PT Stop Time 1521    PT Time Calculation (min) 46 min    Activity Tolerance Patient tolerated treatment well;Patient limited by pain    Behavior During Therapy [facility] for tasks assessed/performed           Past Medical History:  Diagnosis Date  . Asthma    Albuterol in haler prn  . Cataract    immature unsure which eye  . Chronic back pain   . COVID-19 October/November 2020  . Degenerative disk disease    psoriatic  . Diabetes mellitus without complication (HCC)    diet controlled  . Esophageal motility disorder    Non-specific, see modified barium study/speech path, BP  . GERD (gastroesophageal reflux disease)    takes Protonix bid  . Headache 2019  . History of blood transfusion    post c-section  . History of bronchitis   . HTN (hypertension)   . Hyperlipidemia    takes Pravastatin daily  . Lymphocytic colitis 05/26/2010   Responded to Entocort x 3 MOS  . Neuropathy   . Nonallergic rhinitis   . Osteoporosis    gets Boniva every 3 months  . Peripheral edema    takes HCTZ daily  . Pneumonia 02/2019  . Psoriatic arthritis ([place])    Dr. Katherina Right  . Raynaud's disease   . Seasonal allergies   . Shingles 2020  . SUI (stress urinary incontinence, female)   . Urinary urgency     Past Surgical History:  Procedure Laterality Date  . ANTERIOR CERVICAL DECOMP/DISCECTOMY FUSION N/A 03/09/2020   Procedure: ANTERIOR CERVICAL  DECOMPRESSION/DISCECTOMY FUSION, INTERBODY PROSTHESIS, PLATE SCREWS CERVICAL FIVE-SIX, CERVICAL SIX-SEVEN;  Surgeon: Newman Pies, MD;  Location: [facility];  Service: Neurosurgery;  Laterality: N/A;  . BACK SURGERY  2003  . BIOPSY  04/17/2016   Procedure: BIOPSY;  Surgeon: Danie Binder, MD;  [facility];  Service: Endoscopy;;  random colon bx's  . CERVICAL SPINE SURGERY  09/09/2017  . CESAREAN SECTION  1974, 1978      . COLONOSCOPY  06/19/2008   JKD:TOIZT internal hemorrhoids/mild sigmoin colon diverticulosis  . COLONOSCOPY  2012   Dr. Gala Romney: normal rectum, diverticula, lymphocytic colitis   . COLONOSCOPY WITH PROPOFOL N/A 04/17/2016   Dr. Oneida Alar: Normal terminal ileum, internal/external hemorrhoids, random colon biopsies consistent with collagenous colitis  . DILATION AND CURETTAGE OF UTERUS  1977   spontaneous abortion  . ESOPHAGOGASTRODUODENOSCOPY  06/19/2008   IWP:[id number] gastritis  . LUMBAR DISC ARTHROPLASTY  7/98  . LUMBAR FUSION  6/03, 11/08, 11/14   L4-5 fusion, L2-3 fusion, L1-2  . LUMBAR LAMINECTOMY  03/2020  . ODONTOID SCREW INSERTION N/A 01/27/2018   Procedure: ODONTOID SCREW INSERTION;  Surgeon: Newman Pies, MD;  Location: [facility];  Service: Neurosurgery;  Laterality: N/A;  ODONTOID SCREW INSERTION  . TONSILLECTOMY AND ADENOIDECTOMY    . TUBAL LIGATION  There were no vitals filed for this visit.    Subjective Assessment - 05/09/20 1436    Subjective Patient is a 70 y.o. female who presents to physical therapy with c/o lumbar radiculopathy. Patient states she is having a lot of pain. She was going to have back surgery but it was denied because she just had neck surgery in December 2021. She originally hurt her neck at the end of 2019 and she has had several neck surgeries following. She is unable to lay on left side due to bursitis in her hip. She has been having RLE numbness with lying. She then started having numbness/tingling in RLE with standing. She  then had increased pain after MRI in RLE which has been getting worse. She even had to go to the ER because it was so bad. She takes pain meds which help. She has psoriatic arthritis which she's had for 30 years. She also has osteoporosis. Symptoms are constant, worse with movement. She uses heating pad which helps, relaxing with pain meds helps. She doesn't really want to be here and doesn't feel like it is going to help. She has had 3 lower back surgeries with discectomy, lumbar fusions L2-L5.    Pertinent History chronic pain, osteopenia, psoriatic arthritis    Limitations Sitting;House hold activities;Walking;Standing;Lifting    Currently in Pain? Yes    Pain Score 7     Pain Location Back    Pain Orientation Right    Pain Descriptors / Indicators Tingling;Numbness    Pain Type Chronic pain    Pain Onset More than a month ago    Pain Frequency Constant              [facility] PT Assessment - 05/09/20 0001      Assessment   Medical Diagnosis Lumbar Radiculopathy    Referring Provider (PT) Newman Pies MD    Onset Date/Surgical Date 12/02/19    Next MD Visit none scheduled    Prior Therapy yes      Precautions   Precautions Fall      Restrictions   Weight Bearing Restrictions No      Balance Screen   Has the patient fallen in the past 6 months Yes    How many times? 2    Has the patient had a decrease in activity level because of a fear of falling?  No    Is the patient reluctant to leave their home because of a fear of falling?  No      Prior Function   Level of Independence Independent    Vocation Retired      Charity fundraiser Status Within Functional Limits for tasks assessed      Observation/Other Assessments   Observations slouched posture, antalgic gait without AD, decreased lumbar lordosis, unsteady reaching for walls/objects for support    Focus on Therapeutic Outcomes (FOTO)  complete next session      Sensation   Light Touch Appears Intact       ROM / Strength   AROM / PROM / Strength AROM;Strength      AROM   Overall AROM Comments no change in LE symptoms    AROM Assessment Site Lumbar    Lumbar Flexion 50% limited    Lumbar Extension 90% limited    Lumbar - Right Side Bend 50% limited    Lumbar - Left Side Bend 50% limited    Lumbar - Right Rotation 75% limited    Lumbar -  Left Rotation 75% limited      Strength   Strength Assessment Site Hip;Knee;Ankle    Right/Left Hip Right;Left    Right Hip Flexion 4-/5    Left Hip Flexion 4/5    Right/Left Knee Right;Left    Right Knee Flexion 4-/5    Right Knee Extension 4/5    Left Knee Flexion 4-/5    Left Knee Extension 4/5    Right/Left Ankle Right;Left    Right Ankle Dorsiflexion 4-/5    Left Ankle Dorsiflexion 4-/5      Palpation   Palpation comment decreased lumbar lordosis      Transfers   Comments slow, labored, use of hands      Ambulation/Gait   Ambulation/Gait Yes    Ambulation/Gait Assistance 4: Min guard    Ambulation Distance (Feet) 100 Feet    Assistive device None    Gait Pattern Poor foot clearance - right;Poor foot clearance - left;Wide base of support;Trunk flexed    Ambulation Surface Level;Unlevel    Gait velocity decreased    Gait Comments 2MWT, reaching for walls for support, had to stop at 1:20 due to fatigue to sit for remainder of time                      Objective measurements completed on examination: See above findings.               PT Education - 05/09/20 1435    Education Details Patient educated on exam findings, POC, scope of PT, pain, low back pathology, changing movement patterns, core and LE strength, having open mind to PT    Person(s) Educated Patient    Methods Explanation;Demonstration    Comprehension Verbalized understanding;Returned demonstration            PT Short Term Goals - 05/09/20 1614      PT SHORT TERM GOAL #1   Title Patient will be independent with HEP in order to improve  functional outcomes.    Time 2    Period Weeks    Status New    Target Date 05/23/20      PT SHORT TERM GOAL #2   Title Patient will report at least 25% improvement in symptoms for improved quality of life.    Time 2    Period Weeks    Status New    Target Date 05/23/20             PT Long Term Goals - 05/09/20 1615      PT LONG TERM GOAL #1   Title Patient will report at least 75% improvement in symptoms for improved quality of life.    Time 4    Period Weeks    Status New    Target Date 06/06/20      PT LONG TERM GOAL #2   Title Patient will be able to ambulate at least 226 feet in 2MWT in order to demonstrate improved gait speed for community ambulation.    Time 4    Period Weeks    Status New    Target Date 06/06/20      PT LONG TERM GOAL #3   Title Patient will be able to perform sit to stand without UE use in order to demonstrate improved functional strength.    Time 4    Period Weeks    Status New    Target Date 06/06/20  Plan - 05/09/20 1526    Clinical Impression Statement Patient is a 70 y.o. female who presents to physical therapy with c/o lumbar radiculopathy which began 4-5 months ago with insidious onset. She presents with pain limited deficits in lumbar and LE strength, ROM, endurance, postural impairments, spinal mobility, gait, balance, and functional mobility with ADL. She is having to modify and restrict ADL as indicated by subjective information and objective measures which is affecting overall participation. Patient will benefit from skilled physical therapy in order to improve function and reduce impairment.    Personal Factors and Comorbidities Comorbidity 3+;Age;Behavior Pattern;Past/Current Experience;Fitness;Time since onset of injury/illness/exacerbation    Comorbidities psoriatic arthritis, osteopenia, hx lumbar and cervical surgeries, chronic pain    Examination-Activity Limitations Bed  Mobility;Bathing;Sleep;Bend;Stairs;Squat;Locomotion Level;Lift;Hygiene/Grooming;Dressing;Stand;Transfers;Carry;Caring for Others;Sit    Examination-Participation Restrictions Cleaning;Community Activity;Laundry;Meal Prep;Shop;Volunteer;Yard Work    Merchant navy officer Evolving/Moderate complexity    Clinical Decision Making Moderate    Rehab Potential Fair    PT Frequency 2x / week    PT Duration 4 weeks    PT Treatment/Interventions ADLs/Self Care Home Management;Aquatic Therapy;Cryotherapy;Electrical Stimulation;Iontophoresis 4mg /ml Dexamethasone;Moist Heat;Traction;Ultrasound;Parrafin;DME Instruction;Gait training;Stair training;Functional mobility training;Therapeutic activities;Therapeutic exercise;Balance training;Neuromuscular re-education;Patient/family education;Orthotic Fit/Training;Manual techniques;Scar mobilization;Passive range of motion;Dry needling;Energy conservation;Splinting;Taping;Spinal Manipulations;Joint Manipulations    PT Next Visit Plan complete FOTO, begin LE and core strengtheing with mix of table, seated and standing exercises, postural strengtheing, possibly balance exercise, progress all as able    PT Home Exercise Plan begin next session    Consulted and Agree with Plan of Care Patient           Patient will benefit from skilled therapeutic intervention in order to improve the following deficits and impairments:  Abnormal gait,Decreased endurance,Cardiopulmonary status limiting activity,Pain,Decreased activity tolerance,Decreased knowledge of use of DME,Decreased strength,Decreased mobility,Postural dysfunction,Impaired flexibility,Improper body mechanics  Visit Diagnosis: Low back pain, unspecified back pain laterality, unspecified chronicity, unspecified whether sciatica present  Muscle weakness (generalized)  Other abnormalities of gait and mobility  Other symptoms and signs involving the musculoskeletal system     Problem List Patient  Active Problem List   Diagnosis Date Noted  . Cervical spondylosis with myelopathy and radiculopathy 03/09/2020  . AKI (acute kidney injury) ([place]) 02/07/2019  . Acute respiratory failure with hypoxia ([place]) 02/06/2019  . Acute respiratory disease due to COVID-19 virus 02/06/2019  . Community acquired pneumonia 02/06/2019  . Hyponatremia 02/06/2019  . Asthma   . Diabetes mellitus without complication ([place])   . Closed nondisplaced odontoid fracture with type II morphology ([place]) 01/27/2018  . Odontoid fracture ([place]) 01/24/2018  . Chest pain 11/14/2017  . Transaminitis 06/22/2016  . Collagenous colitis 05/23/2016  . Psoriasis 05/21/2016  . High risk medication use 02/15/2016  . Age-related osteoporosis without current pathological fracture 02/15/2016  . Trochanteric bursitis, left hip 02/15/2016  . Sacroiliitis, not elsewhere classified ([place]) 02/15/2016  . Chronic pain syndrome 02/15/2016  . Abnormal weight gain 06/25/2012  . Hypertension 07/03/2011  . Hypercholesteremia 07/03/2011  . Psoriatic arthritis ([place]) 07/03/2011  . Osteoarthritis of multiple joints 07/03/2011  . Esophageal motility disorder 02/14/2011  . Cough 02/14/2011  . GERD 05/05/2010    4:21 PM, 05/09/20 Mearl Latin PT, DPT Physical Therapist at [facility] [place], [place], [address] Phone: [phone]   Fax:  [phone]  Name: Tonya Hall MRN: [id number] Date of Birth: December 25, 1950

## 2020-05-09 NOTE — Telephone Encounter (Signed)
Last Visit: 04/12/2020 Next Visit: 04/05/2020 Labs: 03/02/2020, WBC is elevated due to prednisone use. ALT is mildly elevated. Please advise avoidance of all NSAIDs, Tylenol and alcohol.  Current Dose per office note 04/05/2020, Arava 20mg  po qd DX: Psoriatic arthritis   Last Fill: 01/25/2020  Okay to refill Arava?

## 2020-05-10 ENCOUNTER — Telehealth: Payer: Self-pay

## 2020-05-10 NOTE — Telephone Encounter (Signed)
Patient called stating her insurance has denied her back surgery that she was due to have on 05/19/20.  Patient states she was holding off taking her Cosentyx due to the surgery and now is not sure if she should take it tomorrow 05/11/20 or wait and start the new medication Taltz.  Patient requested a return call.

## 2020-05-12 ENCOUNTER — Encounter (HOSPITAL_COMMUNITY): Payer: Self-pay

## 2020-05-12 ENCOUNTER — Telehealth: Payer: Self-pay

## 2020-05-12 ENCOUNTER — Other Ambulatory Visit: Payer: Self-pay

## 2020-05-12 ENCOUNTER — Ambulatory Visit (HOSPITAL_COMMUNITY): Payer: PPO

## 2020-05-12 VITALS — BP 169/80 | HR 100

## 2020-05-12 DIAGNOSIS — M6281 Muscle weakness (generalized): Secondary | ICD-10-CM

## 2020-05-12 DIAGNOSIS — R29898 Other symptoms and signs involving the musculoskeletal system: Secondary | ICD-10-CM

## 2020-05-12 DIAGNOSIS — R2689 Other abnormalities of gait and mobility: Secondary | ICD-10-CM

## 2020-05-12 DIAGNOSIS — M545 Low back pain, unspecified: Secondary | ICD-10-CM

## 2020-05-12 DIAGNOSIS — R000 Tachycardia, unspecified: Secondary | ICD-10-CM | POA: Diagnosis not present

## 2020-05-12 NOTE — Telephone Encounter (Signed)
Patient plans to appeal surgery again. Patient was due for Cosentyx on Wednesday but we discussed that because Donnetta Hail has to be loaded then her therapy may be interrupted.   Advised her to take last Cosentyx 150mg  pen dose at home which would provide a 2 week time period to potentially have back surgery appeal processed.  Discussed that she could request appeal be processed as urgent/expedited which may require discussion with handling provider - we were both unsure if this was possible.  If appeal is again denied, then we can move forward with Taltz. She will keep [place] updated on the surgery appeal process.  Knox Saliva, PharmD, MPH Clinical Pharmacist (Rheumatology and Pulmonology)

## 2020-05-12 NOTE — Therapy (Signed)
[facility] [address] [place], [place], [address] Phone: [phone]   Fax:  [phone]  Physical Therapy Treatment  Patient Details  Name: KALYIAH SAINTIL MRN: [id number] Date of Birth: 1950/10/07 Referring Provider (PT): Newman Pies MD   Encounter Date: 05/12/2020   PT End of Session - 05/12/20 1054    Visit Number 2    Number of Visits 8    Date for PT Re-Evaluation 06/06/20    Authorization Type [employer] (no auth no visit limit)    PT Start Time 1047    PT Stop Time 1125    PT Time Calculation (min) 38 min    Activity Tolerance Patient tolerated treatment well;Patient limited by pain    Behavior During Therapy [facility] for tasks assessed/performed           Past Medical History:  Diagnosis Date  . Asthma    Albuterol in haler prn  . Cataract    immature unsure which eye  . Chronic back pain   . COVID-19 October/November 2020  . Degenerative disk disease    psoriatic  . Diabetes mellitus without complication (HCC)    diet controlled  . Esophageal motility disorder    Non-specific, see modified barium study/speech path, BP  . GERD (gastroesophageal reflux disease)    takes Protonix bid  . Headache 2019  . History of blood transfusion    post c-section  . History of bronchitis   . HTN (hypertension)   . Hyperlipidemia    takes Pravastatin daily  . Lymphocytic colitis 05/26/2010   Responded to Entocort x 3 MOS  . Neuropathy   . Nonallergic rhinitis   . Osteoporosis    gets Boniva every 3 months  . Peripheral edema    takes HCTZ daily  . Pneumonia 02/2019  . Psoriatic arthritis ([place])    Dr. Katherina Right  . Raynaud's disease   . Seasonal allergies   . Shingles 2020  . SUI (stress urinary incontinence, female)   . Urinary urgency     Past Surgical History:  Procedure Laterality Date  . ANTERIOR CERVICAL DECOMP/DISCECTOMY FUSION N/A 03/09/2020   Procedure: ANTERIOR CERVICAL  DECOMPRESSION/DISCECTOMY FUSION, INTERBODY PROSTHESIS, PLATE SCREWS CERVICAL FIVE-SIX, CERVICAL SIX-SEVEN;  Surgeon: Newman Pies, MD;  Location: [facility];  Service: Neurosurgery;  Laterality: N/A;  . BACK SURGERY  2003  . BIOPSY  04/17/2016   Procedure: BIOPSY;  Surgeon: Danie Binder, MD;  [facility];  Service: Endoscopy;;  random colon bx's  . CERVICAL SPINE SURGERY  09/09/2017  . CESAREAN SECTION  1974, 1978      . COLONOSCOPY  06/19/2008   ONG:EXBMW internal hemorrhoids/mild sigmoin colon diverticulosis  . COLONOSCOPY  2012   Dr. Gala Romney: normal rectum, diverticula, lymphocytic colitis   . COLONOSCOPY WITH PROPOFOL N/A 04/17/2016   Dr. Oneida Alar: Normal terminal ileum, internal/external hemorrhoids, random colon biopsies consistent with collagenous colitis  . DILATION AND CURETTAGE OF UTERUS  1977   spontaneous abortion  . ESOPHAGOGASTRODUODENOSCOPY  06/19/2008   UXL:[id number] gastritis  . LUMBAR DISC ARTHROPLASTY  7/98  . LUMBAR FUSION  6/03, 11/08, 11/14   L4-5 fusion, L2-3 fusion, L1-2  . LUMBAR LAMINECTOMY  03/2020  . ODONTOID SCREW INSERTION N/A 01/27/2018   Procedure: ODONTOID SCREW INSERTION;  Surgeon: Newman Pies, MD;  Location: [facility];  Service: Neurosurgery;  Laterality: N/A;  ODONTOID SCREW INSERTION  . TONSILLECTOMY AND ADENOIDECTOMY    . TUBAL LIGATION  Vitals:   05/12/20 1049  BP: (!) 169/80  Pulse: 100     Subjective Assessment - 05/12/20 1049    Subjective Pt reports she's not doing well today.  Reports she woke up to high pulse at 135 bmp 2 nights ago, going to MD later today.  Has some pelvic pain and pain down Rt LE including toes.    Pertinent History chronic pain, osteopenia, psoriatic arthritis    Currently in Pain? Yes    Pain Score 7     Pain Location Back   Radicular symptoms Rt LE 7/10, LBP 5/10   Pain Orientation Right    Pain Descriptors / Indicators Aching;Tingling;Numbness;Tightness   Tightness in thigh   Pain Type Chronic pain     Pain Radiating Towards Rt LE down to toes    Pain Onset More than a month ago    Pain Frequency Constant    Aggravating Factors  walking, lying down    Pain Relieving Factors pain meds, chair with LE elevated and heated pad    Effect of Pain on Daily Activities limits              [facility] PT Assessment - 05/12/20 0001      Assessment   Medical Diagnosis Lumbar Radiculopathy    Referring Provider (PT) Newman Pies MD    Onset Date/Surgical Date 12/02/19    Next MD Visit none scheduled    Prior Therapy yes      Precautions   Precautions Fall      Observation/Other Assessments   Focus on Therapeutic Outcomes (FOTO)  37.26% functional                         [facility] Adult PT Treatment/Exercise - 05/12/20 0001      Exercises   Exercises Lumbar      Lumbar Exercises: Standing   Other Standing Lumbar Exercises wall arch 10x 5"      Lumbar Exercises: Seated   Other Seated Lumbar Exercises Proper posture deep breathing x2 min      Lumbar Exercises: Supine   Bridge 5 reps    Other Supine Lumbar Exercises Decompression 1-5                  PT Education - 05/12/20 1100    Education Details Reviewed goals, educated importance of HEP compliance.  Vitals checked wiht high BP and high pulse.  FOTO survery complete with 37.26% functional ability    Person(s) Educated Patient    Methods Explanation;Demonstration;Verbal cues    Comprehension Verbalized understanding;Returned demonstration            PT Short Term Goals - 05/09/20 1614      PT SHORT TERM GOAL #1   Title Patient will be independent with HEP in order to improve functional outcomes.    Time 2    Period Weeks    Status New    Target Date 05/23/20      PT SHORT TERM GOAL #2   Title Patient will report at least 25% improvement in symptoms for improved quality of life.    Time 2    Period Weeks    Status New    Target Date 05/23/20             PT Long Term Goals - 05/09/20  1615      PT LONG TERM GOAL #1   Title Patient will report at least 75% improvement in  symptoms for improved quality of life.    Time 4    Period Weeks    Status New    Target Date 06/06/20      PT LONG TERM GOAL #2   Title Patient will be able to ambulate at least 226 feet in 2MWT in order to demonstrate improved gait speed for community ambulation.    Time 4    Period Weeks    Status New    Target Date 06/06/20      PT LONG TERM GOAL #3   Title Patient will be able to perform sit to stand without UE use in order to demonstrate improved functional strength.    Time 4    Period Weeks    Status New    Target Date 06/06/20                 Plan - 05/12/20 1113    Clinical Impression Statement Reviewed goals, educated importance of HEP compliance that was established this sessoin.  Checked vitals following reports of high pulse rate a couple, wrote down vitals onto note for pt to show MD later today.  FOTO complete indicating decreased self perceived functional abilities wiht score 37.26%.  Session focus on proximal strengthening and educated importance of posture.  Pt limited by pain through session.  Reports of intermittent tingling Rt LE in supine position, reports reduction on symptoms while walking.    Personal Factors and Comorbidities Comorbidity 3+;Age;Behavior Pattern;Past/Current Experience;Fitness;Time since onset of injury/illness/exacerbation    Comorbidities psoriatic arthritis, osteopenia, hx lumbar and cervical surgeries, chronic pain    Examination-Activity Limitations Bed Mobility;Bathing;Sleep;Bend;Stairs;Squat;Locomotion Level;Lift;Hygiene/Grooming;Dressing;Stand;Transfers;Carry;Caring for Others;Sit    Examination-Participation Restrictions Cleaning;Community Activity;Laundry;Meal Prep;Shop;Volunteer;Yard Work    [employer]chant [employer] officer Evolving/Moderate complexity    Clinical Decision Making Moderate    Rehab Potential Fair    PT Frequency 2x /  week    PT Duration 4 weeks    PT Treatment/Interventions ADLs/Self Care Home Management;Aquatic Therapy;Cryotherapy;Electrical Stimulation;Iontophoresis 4mg /ml Dexamethasone;Moist Heat;Traction;Ultrasound;Parrafin;DME Instruction;Gait training;Stair training;Functional mobility training;Therapeutic activities;Therapeutic exercise;Balance training;Neuromuscular re-education;Patient/family education;Orthotic Fit/Training;Manual techniques;Scar mobilization;Passive range of motion;Dry needling;Energy conservation;Splinting;Taping;Spinal Manipulations;Joint Manipulations    PT Next Visit Plan LE and core strengtheing with mix of table, seated and standing exercises, postural strengtheing, possibly balance exercise, progress all as able    PT Home Exercise Plan 2/10: decompression exercises 1-5, good seated posture           Patient will benefit from skilled therapeutic intervention in order to improve the following deficits and impairments:  Abnormal gait,Decreased endurance,Cardiopulmonary status limiting activity,Pain,Decreased activity tolerance,Decreased knowledge of use of DME,Decreased strength,Decreased mobility,Postural dysfunction,Impaired flexibility,Improper body mechanics  Visit Diagnosis: Low back pain, unspecified back pain laterality, unspecified chronicity, unspecified whether sciatica present  Muscle weakness (generalized)  Other abnormalities of gait and mobility  Other symptoms and signs involving the musculoskeletal system     Problem List Patient Active Problem List   Diagnosis Date Noted  . Cervical spondylosis with myelopathy and radiculopathy 03/09/2020  . AKI (acute kidney injury) ([place]) 02/07/2019  . Acute respiratory failure with hypoxia ([place]) 02/06/2019  . Acute respiratory disease due to COVID-19 virus 02/06/2019  . Community acquired pneumonia 02/06/2019  . Hyponatremia 02/06/2019  . Asthma   . Diabetes mellitus without complication ([place])   . Closed  nondisplaced odontoid fracture with type II morphology ([place]) 01/27/2018  . Odontoid fracture ([place]) 01/24/2018  . Chest pain 11/14/2017  . Transaminitis 06/22/2016  . Collagenous colitis 05/23/2016  . Psoriasis 05/21/2016  . High risk  medication use 02/15/2016  . Age-related osteoporosis without current pathological fracture 02/15/2016  . Trochanteric bursitis, left hip 02/15/2016  . Sacroiliitis, not elsewhere classified ([place]) 02/15/2016  . Chronic pain syndrome 02/15/2016  . Abnormal weight gain 06/25/2012  . Hypertension 07/03/2011  . Hypercholesteremia 07/03/2011  . Psoriatic arthritis ([place]) 07/03/2011  . Osteoarthritis of multiple joints 07/03/2011  . Esophageal motility disorder 02/14/2011  . Cough 02/14/2011  . GERD 05/05/2010   Ihor Austin, LPTA/CLT; CBIS [phone]  Aldona Lento 05/12/2020, 1:11 PM  [facility] [place], [place], [address] Phone: [phone]   Fax:  [phone]  Name: RAPHAELA CANNADAY MRN: [id number] Date of Birth: 1951-03-24

## 2020-05-12 NOTE — Telephone Encounter (Signed)
Patient called stating she left a message on Tuesday asking if she should take her Cosentyx on Wednesday, 05/11/20 or if she needs to start the new medication.  Patient states "she is surprised that no one from the office has returned her call to answer her questions."

## 2020-05-12 NOTE — Telephone Encounter (Signed)
Patient plans to appeal surgery again. Patient was due for Cosentyx on Wednesday but we discussed that because Donnetta Hail has to be loaded then her therapy may be interrupted.   Advised her to take last Cosentyx 150mg  pen dose at home which would provide a 2 week time period to potentially have back surgery appeal processed.  Discussed that she could request appeal be processed as urgent/expedited which may require discussion with handling provider - we were both unsure if this was possible.  If appeal is again denied, then we can move forward with [facility]. She will keep [place] updated on the surgery appeal process. Will f/u in 05/10/20 encounter   Knox Saliva, PharmD, MPH Clinical Pharmacist (Rheumatology and Pulmonology)

## 2020-05-16 ENCOUNTER — Telehealth (HOSPITAL_COMMUNITY): Payer: Self-pay

## 2020-05-16 ENCOUNTER — Encounter (HOSPITAL_COMMUNITY): Payer: PPO

## 2020-05-16 DIAGNOSIS — R000 Tachycardia, unspecified: Secondary | ICD-10-CM | POA: Diagnosis not present

## 2020-05-16 NOTE — Telephone Encounter (Signed)
pt called to cx due to her abcess tooth

## 2020-05-17 ENCOUNTER — Other Ambulatory Visit: Payer: Self-pay | Admitting: Nurse Practitioner

## 2020-05-17 DIAGNOSIS — R197 Diarrhea, unspecified: Secondary | ICD-10-CM

## 2020-05-17 DIAGNOSIS — K219 Gastro-esophageal reflux disease without esophagitis: Secondary | ICD-10-CM

## 2020-05-18 ENCOUNTER — Ambulatory Visit (HOSPITAL_COMMUNITY): Payer: PPO | Admitting: Physical Therapy

## 2020-05-19 ENCOUNTER — Inpatient Hospital Stay: Admit: 2020-05-19 | Payer: PPO | Admitting: Neurosurgery

## 2020-05-19 SURGERY — POSTERIOR LUMBAR FUSION 1 LEVEL
Anesthesia: General

## 2020-05-23 ENCOUNTER — Other Ambulatory Visit: Payer: Self-pay

## 2020-05-23 ENCOUNTER — Encounter (HOSPITAL_COMMUNITY): Payer: Self-pay | Admitting: Physical Therapy

## 2020-05-23 ENCOUNTER — Ambulatory Visit (HOSPITAL_COMMUNITY): Payer: PPO | Admitting: Physical Therapy

## 2020-05-23 DIAGNOSIS — R29898 Other symptoms and signs involving the musculoskeletal system: Secondary | ICD-10-CM

## 2020-05-23 DIAGNOSIS — R2689 Other abnormalities of gait and mobility: Secondary | ICD-10-CM

## 2020-05-23 DIAGNOSIS — M545 Low back pain, unspecified: Secondary | ICD-10-CM

## 2020-05-23 DIAGNOSIS — M6281 Muscle weakness (generalized): Secondary | ICD-10-CM

## 2020-05-23 NOTE — Therapy (Signed)
[facility] [address] [place], [place], [address] Phone: [phone]   Fax:  [phone]  Physical Therapy Treatment  Patient Details  Name: Tonya Hall MRN: [id number] Date of Birth: [birth year] Referring Provider (PT): Newman Pies MD   Encounter Date: 05/23/2020   PT End of Session - 05/23/20 1052    Visit Number 3    Number of Visits 8    Date for PT Re-Evaluation 06/06/20    Authorization Type [employer] (no auth no visit limit)    PT Start Time 1052    PT Stop Time 1130    PT Time Calculation (min) 38 min    Activity Tolerance Patient tolerated treatment well;Patient limited by pain    Behavior During Therapy [facility] for tasks assessed/performed           Past Medical History:  Diagnosis Date  . Asthma    Albuterol in haler prn  . Cataract    immature unsure which eye  . Chronic back pain   . COVID-19 October/November 2020  . Degenerative disk disease    psoriatic  . Diabetes mellitus without complication (HCC)    diet controlled  . Esophageal motility disorder    Non-specific, see modified barium study/speech path, BP  . GERD (gastroesophageal reflux disease)    takes Protonix bid  . Headache 2019  . History of blood transfusion    post c-section  . History of bronchitis   . HTN (hypertension)   . Hyperlipidemia    takes Pravastatin daily  . Lymphocytic colitis 05/26/2010   Responded to Entocort x 3 MOS  . Neuropathy   . Nonallergic rhinitis   . Osteoporosis    gets Boniva every 3 months  . Peripheral edema    takes HCTZ daily  . Pneumonia 02/2019  . Psoriatic arthritis ([place])    Dr. Katherina Right  . Raynaud's disease   . Seasonal allergies   . Shingles 2020  . SUI (stress urinary incontinence, female)   . Urinary urgency     Past Surgical History:  Procedure Laterality Date  . ANTERIOR CERVICAL DECOMP/DISCECTOMY FUSION N/A 03/09/2020   Procedure: ANTERIOR CERVICAL  DECOMPRESSION/DISCECTOMY FUSION, INTERBODY PROSTHESIS, PLATE SCREWS CERVICAL FIVE-SIX, CERVICAL SIX-SEVEN;  Surgeon: Newman Pies, MD;  Location: [facility];  Service: Neurosurgery;  Laterality: N/A;  . BACK SURGERY  2003  . BIOPSY  04/17/2016   Procedure: BIOPSY;  Surgeon: Danie Binder, MD;  [facility];  Service: Endoscopy;;  random colon bx's  . CERVICAL SPINE SURGERY  09/09/2017  . CESAREAN SECTION  1974, 1978      . COLONOSCOPY  06/19/2008   IEP:PIRJJ internal hemorrhoids/mild sigmoin colon diverticulosis  . COLONOSCOPY  2012   Dr. Gala Romney: normal rectum, diverticula, lymphocytic colitis   . COLONOSCOPY WITH PROPOFOL N/A 04/17/2016   Dr. Oneida Alar: Normal terminal ileum, internal/external hemorrhoids, random colon biopsies consistent with collagenous colitis  . DILATION AND CURETTAGE OF UTERUS  1977   spontaneous abortion  . ESOPHAGOGASTRODUODENOSCOPY  06/19/2008   OAC:[id number] gastritis  . LUMBAR DISC ARTHROPLASTY  7/98  . LUMBAR FUSION  6/03, 11/08, 11/14   L4-5 fusion, L2-3 fusion, L1-2  . LUMBAR LAMINECTOMY  03/2020  . ODONTOID SCREW INSERTION N/A 01/27/2018   Procedure: ODONTOID SCREW INSERTION;  Surgeon: Newman Pies, MD;  Location: [facility];  Service: Neurosurgery;  Laterality: N/A;  ODONTOID SCREW INSERTION  . TONSILLECTOMY AND ADENOIDECTOMY    . TUBAL LIGATION  There were no vitals filed for this visit.   Subjective Assessment - 05/23/20 1053    Subjective Her symptoms are on and off. She has her leg numbness all the way down. She had a tooth ache last week.    Pertinent History chronic pain, osteopenia, psoriatic arthritis    Currently in Pain? Yes    Pain Score 7     Pain Location Back    Pain Orientation Right    Pain Descriptors / Indicators Aching;Numbness    Pain Type Chronic pain    Pain Onset More than a month ago                             [facility] Adult PT Treatment/Exercise - 05/23/20 0001      Lumbar Exercises: Stretches    Single Knee to Chest Stretch 5 reps;10 seconds      Lumbar Exercises: Standing   Other Standing Lumbar Exercises hip abduction 1x 10 bilateral      Lumbar Exercises: Seated   Other Seated Lumbar Exercises shoulder Row and extension 2x 10 with red band each    Other Seated Lumbar Exercises hip abd/add iso 10x 10 second holds      Lumbar Exercises: Supine   Glut Set 5 reps;5 seconds    Bridge 10 reps                  PT Education - 05/23/20 1053    Education Details HEP, exercise mechanics, purpose of exercises for mobility/strength    Person(s) Educated Patient    Methods Explanation;Demonstration;Handout    Comprehension Verbalized understanding;Returned demonstration            PT Short Term Goals - 05/09/20 1614      PT SHORT TERM GOAL #1   Title Patient will be independent with HEP in order to improve functional outcomes.    Time 2    Period Weeks    Status New    Target Date 05/23/20      PT SHORT TERM GOAL #2   Title Patient will report at least 25% improvement in symptoms for improved quality of life.    Time 2    Period Weeks    Status New    Target Date 05/23/20             PT Long Term Goals - 05/09/20 1615      PT LONG TERM GOAL #1   Title Patient will report at least 75% improvement in symptoms for improved quality of life.    Time 4    Period Weeks    Status New    Target Date 06/06/20      PT LONG TERM GOAL #2   Title Patient will be able to ambulate at least 226 feet in 2MWT in order to demonstrate improved gait speed for community ambulation.    Time 4    Period Weeks    Status New    Target Date 06/06/20      PT LONG TERM GOAL #3   Title Patient will be able to perform sit to stand without UE use in order to demonstrate improved functional strength.    Time 4    Period Weeks    Status New    Target Date 06/06/20                 Plan - 05/23/20 1053    Clinical Impression  Statement Patient given cueing for glute  activation with bridge exercise with good carry over but c/o hamstring pain/cramping. Patient given cueing for glute activation with standing hip abduction.  Patient with increase in RLE symptoms with standing hip abduction exercise. Patient requires mix of standing/supine/seated exercises as she does not tolerate prolonged positioning. Patient will continue to benefit from skilled physical therapy in order to reduce impairment and improve function.    Personal Factors and Comorbidities Comorbidity 3+;Age;Behavior Pattern;Past/Current Experience;Fitness;Time since onset of injury/illness/exacerbation    Comorbidities psoriatic arthritis, osteopenia, hx lumbar and cervical surgeries, chronic pain    Examination-Activity Limitations Bed Mobility;Bathing;Sleep;Bend;Stairs;Squat;Locomotion Level;Lift;Hygiene/Grooming;Dressing;Stand;Transfers;Carry;Caring for Others;Sit    Examination-Participation Restrictions Cleaning;Community Activity;Laundry;Meal Prep;Shop;Volunteer;Yard Work    [employer]chant [employer] officer Evolving/Moderate complexity    Rehab Potential Fair    PT Frequency 2x / week    PT Duration 4 weeks    PT Treatment/Interventions ADLs/Self Care Home Management;Aquatic Therapy;Cryotherapy;Electrical Stimulation;Iontophoresis 4mg /ml Dexamethasone;Moist Heat;Traction;Ultrasound;Parrafin;DME Instruction;Gait training;Stair training;Functional mobility training;Therapeutic activities;Therapeutic exercise;Balance training;Neuromuscular re-education;Patient/family education;Orthotic Fit/Training;Manual techniques;Scar mobilization;Passive range of motion;Dry needling;Energy conservation;Splinting;Taping;Spinal Manipulations;Joint Manipulations    PT Next Visit Plan LE and core strengtheing with mix of table, seated and standing exercises, postural strengtheing, possibly balance exercise, progress all as able    PT Home Exercise Plan 2/10: decompression exercises 1-5, good seated posture 2/21 hip  abd/add iso           Patient will benefit from skilled therapeutic intervention in order to improve the following deficits and impairments:  Abnormal gait,Decreased endurance,Cardiopulmonary status limiting activity,Pain,Decreased activity tolerance,Decreased knowledge of use of DME,Decreased strength,Decreased mobility,Postural dysfunction,Impaired flexibility,Improper body mechanics  Visit Diagnosis: Low back pain, unspecified back pain laterality, unspecified chronicity, unspecified whether sciatica present  Muscle weakness (generalized)  Other abnormalities of gait and mobility  Other symptoms and signs involving the musculoskeletal system     Problem List Patient Active Problem List   Diagnosis Date Noted  . Cervical spondylosis with myelopathy and radiculopathy 03/09/2020  . AKI (acute kidney injury) ([place]) 02/07/2019  . Acute respiratory failure with hypoxia ([place]) 02/06/2019  . Acute respiratory disease due to COVID-19 virus 02/06/2019  . Community acquired pneumonia 02/06/2019  . Hyponatremia 02/06/2019  . Asthma   . Diabetes mellitus without complication ([place])   . Closed nondisplaced odontoid fracture with type II morphology ([place]) 01/27/2018  . Odontoid fracture ([place]) 01/24/2018  . Chest pain 11/14/2017  . Transaminitis 06/22/2016  . Collagenous colitis 05/23/2016  . Psoriasis 05/21/2016  . High risk medication use 02/15/2016  . Age-related osteoporosis without current pathological fracture 02/15/2016  . Trochanteric bursitis, left hip 02/15/2016  . Sacroiliitis, not elsewhere classified ([place]) 02/15/2016  . Chronic pain syndrome 02/15/2016  . Abnormal weight gain 06/25/2012  . Hypertension 07/03/2011  . Hypercholesteremia 07/03/2011  . Psoriatic arthritis ([place]) 07/03/2011  . Osteoarthritis of multiple joints 07/03/2011  . Esophageal motility disorder 02/14/2011  . Cough 02/14/2011  . GERD 05/05/2010    11:35 AM, 05/23/20 Mearl Latin PT,  DPT Physical Therapist at [facility] [place], [place], [address] Phone: [phone]   Fax:  [phone]  Name: Tonya Hall MRN: [id number] Date of Birth: Apr 28, 1950

## 2020-05-23 NOTE — Patient Instructions (Signed)
Access Code: [email or web address] URL: [email or web address] Date: 05/23/2020 Prepared by: Mitzi Hansen Aahan Marques  Exercises Seated Isometric Hip Abduction with Belt - 1 x daily - 7 x weekly - 10 reps - 10 second hold Seated Hip Adduction Isometrics with Ball - 1 x daily - 7 x weekly - 10 reps - 10 second hold

## 2020-05-24 ENCOUNTER — Other Ambulatory Visit: Payer: Self-pay | Admitting: Rheumatology

## 2020-05-24 NOTE — Telephone Encounter (Signed)
Last Visit: 04/05/2020 Next Visit: 07/06/2020  Current Dose per office note on 04/05/2020, not discussed  Dx: Psoriatic arthritis   Last Fill: 03/02/2020  Okay to refill Topamax?

## 2020-05-25 ENCOUNTER — Other Ambulatory Visit: Payer: Self-pay

## 2020-05-25 ENCOUNTER — Ambulatory Visit (HOSPITAL_COMMUNITY): Payer: PPO | Admitting: Physical Therapy

## 2020-05-25 ENCOUNTER — Encounter (HOSPITAL_COMMUNITY): Payer: Self-pay | Admitting: Physical Therapy

## 2020-05-25 DIAGNOSIS — M545 Low back pain, unspecified: Secondary | ICD-10-CM | POA: Diagnosis not present

## 2020-05-25 DIAGNOSIS — R29898 Other symptoms and signs involving the musculoskeletal system: Secondary | ICD-10-CM

## 2020-05-25 DIAGNOSIS — M6281 Muscle weakness (generalized): Secondary | ICD-10-CM

## 2020-05-25 DIAGNOSIS — R2689 Other abnormalities of gait and mobility: Secondary | ICD-10-CM

## 2020-05-25 NOTE — Therapy (Signed)
[facility] [address] [place], [place], [address] Phone: [phone]   Fax:  [phone]  Physical Therapy Treatment/Discharge Summary  Patient Details  Name: Tonya Hall MRN: [id number] Date of Birth: [birth year] Referring Provider (PT): Newman Pies MD   Encounter Date: 05/25/2020  PHYSICAL THERAPY DISCHARGE SUMMARY  Visits from Start of Care: 4  Current functional level related to goals / functional outcomes: See below   Remaining deficits: See below   Education / Equipment: See below  Plan: Patient agrees to discharge.  Patient goals were not met. Patient is being discharged due to lack of progress.  ?????        PT End of Session - 05/25/20 1134    Visit Number 4    Number of Visits 8    Date for PT Re-Evaluation 06/06/20    Authorization Type [employer] (no auth no visit limit)    PT Start Time 1134    PT Stop Time 1159    PT Time Calculation (min) 25 min    Activity Tolerance Patient tolerated treatment well;Patient limited by pain    Behavior During Therapy [facility] for tasks assessed/performed           Past Medical History:  Diagnosis Date  . Asthma    Albuterol in haler prn  . Cataract    immature unsure which eye  . Chronic back pain   . COVID-19 October/November 2020  . Degenerative disk disease    psoriatic  . Diabetes mellitus without complication (HCC)    diet controlled  . Esophageal motility disorder    Non-specific, see modified barium study/speech path, BP  . GERD (gastroesophageal reflux disease)    takes Protonix bid  . Headache 2019  . History of blood transfusion    post c-section  . History of bronchitis   . HTN (hypertension)   . Hyperlipidemia    takes Pravastatin daily  . Lymphocytic colitis 05/26/2010   Responded to Entocort x 3 MOS  . Neuropathy   . Nonallergic rhinitis   . Osteoporosis    gets Boniva every 3 months  . Peripheral edema    takes HCTZ  daily  . Pneumonia 02/2019  . Psoriatic arthritis ([place])    Dr. Katherina Right  . Raynaud's disease   . Seasonal allergies   . Shingles 2020  . SUI (stress urinary incontinence, female)   . Urinary urgency     Past Surgical History:  Procedure Laterality Date  . ANTERIOR CERVICAL DECOMP/DISCECTOMY FUSION N/A 03/09/2020   Procedure: ANTERIOR CERVICAL DECOMPRESSION/DISCECTOMY FUSION, INTERBODY PROSTHESIS, PLATE SCREWS CERVICAL FIVE-SIX, CERVICAL SIX-SEVEN;  Surgeon: Newman Pies, MD;  Location: [facility];  Service: Neurosurgery;  Laterality: N/A;  . BACK SURGERY  2003  . BIOPSY  04/17/2016   Procedure: BIOPSY;  Surgeon: Danie Binder, MD;  [facility];  Service: Endoscopy;;  random colon bx's  . CERVICAL SPINE SURGERY  09/09/2017  . CESAREAN SECTION  1974, 1978      . COLONOSCOPY  06/19/2008   [id number]:[id number] internal hemorrhoids/mild sigmoin colon diverticulosis  . COLONOSCOPY  2012   Dr. Gala Romney: normal rectum, diverticula, lymphocytic colitis   . COLONOSCOPY WITH PROPOFOL N/A 04/17/2016   Dr. Oneida Alar: Normal terminal ileum, internal/external hemorrhoids, random colon biopsies consistent with collagenous colitis  . DILATION AND CURETTAGE OF UTERUS  1977   spontaneous abortion  . ESOPHAGOGASTRODUODENOSCOPY  06/19/2008   [id number] gastritis  . LUMBAR DISC ARTHROPLASTY  7/98  . LUMBAR  FUSION  6/03, 11/08, 11/14   L4-5 fusion, L2-3 fusion, L1-2  . LUMBAR LAMINECTOMY  03/2020  . ODONTOID SCREW INSERTION N/A 01/27/2018   Procedure: ODONTOID SCREW INSERTION;  Surgeon: Newman Pies, MD;  Location: [facility];  Service: Neurosurgery;  Laterality: N/A;  ODONTOID SCREW INSERTION  . TONSILLECTOMY AND ADENOIDECTOMY    . TUBAL LIGATION      There were no vitals filed for this visit.   Subjective Assessment - 05/25/20 1135    Subjective Patient states really bad pain in her R leg today. Nothing has really helped. Heating pad and pain meds help a little.    Pertinent History chronic pain,  osteopenia, psoriatic arthritis    Currently in Pain? Yes    Pain Score 8     Pain Location Leg    Pain Orientation Right    Pain Descriptors / Indicators Numbness    Pain Type Chronic pain    Pain Onset More than a month ago              [facility] PT Assessment - 05/25/20 0001      Assessment   Medical Diagnosis Lumbar Radiculopathy    Referring Provider (PT) Newman Pies MD    Onset Date/Surgical Date 12/02/19    Next MD Visit none scheduled    Prior Therapy yes      Precautions   Precautions Fall      Restrictions   Weight Bearing Restrictions No      Balance Screen   Has the patient fallen in the past 6 months Yes    How many times? 2    Has the patient had a decrease in activity level because of a fear of falling?  No    Is the patient reluctant to leave their home because of a fear of falling?  No      Prior Function   Level of Independence Independent    Vocation Retired      Charity fundraiser Status Within Functional Limits for tasks assessed      Observation/Other Assessments   Observations slouched posture, antalgic gait without AD, decreased lumbar lordosis, unsteady reaching for walls/objects for support    Focus on Therapeutic Outcomes (FOTO)  35% function      Sensation   Light Touch Appears Intact      Strength   Right Hip Flexion 4-/5    Left Hip Flexion 4-/5    Right Knee Flexion 4-/5    Right Knee Extension 4/5    Left Knee Flexion 4-/5    Left Knee Extension 4/5    Right Ankle Dorsiflexion 4-/5    Left Ankle Dorsiflexion 4-/5      Transfers   Comments requires use of hands      Ambulation/Gait   Ambulation/Gait Yes    Ambulation/Gait Assistance 4: Min guard    Ambulation Distance (Feet) 125 Feet    Assistive device None    Gait Pattern Poor foot clearance - right;Poor foot clearance - left;Wide base of support;Trunk flexed    Ambulation Surface Level;Indoor    Gait velocity decreased    Gait Comments 2MWT, reaching  for walls for support, had to stop at 1:30 due to fatigue to sit for remainder of time                         [facility] Adult PT Treatment/Exercise - 05/25/20 0001      Lumbar Exercises:  Seated   Other Seated Lumbar Exercises shoulder IR stretch 2 x 20 seconds                  PT Education - 05/25/20 1134    Education Details HEP, exercise mechanics    Person(s) Educated Patient    Methods Explanation;Demonstration    Comprehension Verbalized understanding;Returned demonstration            PT Short Term Goals - 05/25/20 1144      PT SHORT TERM GOAL #1   Title Patient will be independent with HEP in order to improve functional outcomes.    Time 2    Period Weeks    Status Not Met    Target Date 05/23/20      PT SHORT TERM GOAL #2   Title Patient will report at least 25% improvement in symptoms for improved quality of life.    Time 2    Period Weeks    Status Not Met    Target Date 05/23/20             PT Long Term Goals - 05/25/20 1145      PT LONG TERM GOAL #1   Title Patient will report at least 75% improvement in symptoms for improved quality of life.    Time 4    Period Weeks    Status Not Met      PT LONG TERM GOAL #2   Title Patient will be able to ambulate at least 226 feet in 2MWT in order to demonstrate improved gait speed for community ambulation.    Time 4    Period Weeks    Status Not Met      PT LONG TERM GOAL #3   Title Patient will be able to perform sit to stand without UE use in order to demonstrate improved functional strength.    Time 4    Period Weeks    Status Not Met                 Plan - 05/25/20 1134    Clinical Impression Statement Patient has not met any goals at this time and remains limited by low back and R LE symptoms. Patient stating no improvement in symptoms since beginning physical therapy. Patient showing impaired activity tolerance throughout sessions and is limited by positional intolerance  of sitting, standing, lying and c/o other orthopedic issue. Patient educated on returning to physical therapy if needed. Patient making minimal progress in objective measures since initial evaluation. Patient discharged from physical therapy at this time.    Personal Factors and Comorbidities Comorbidity 3+;Age;Behavior Pattern;Past/Current Experience;Fitness;Time since onset of injury/illness/exacerbation    Comorbidities psoriatic arthritis, osteopenia, hx lumbar and cervical surgeries, chronic pain    Examination-Activity Limitations Bed Mobility;Bathing;Sleep;Bend;Stairs;Squat;Locomotion Level;Lift;Hygiene/Grooming;Dressing;Stand;Transfers;Carry;Caring for Others;Sit    Examination-Participation Restrictions Cleaning;Community Activity;Laundry;Meal Prep;Shop;Volunteer;Yard Work    Merchant navy officer Evolving/Moderate complexity    Rehab Potential Fair    PT Frequency --    PT Duration --    PT Treatment/Interventions ADLs/Self Care Home Management;Aquatic Therapy;Cryotherapy;Electrical Stimulation;Iontophoresis 4mg /ml Dexamethasone;Moist Heat;Traction;Ultrasound;Parrafin;DME Instruction;Gait training;Stair training;Functional mobility training;Therapeutic activities;Therapeutic exercise;Balance training;Neuromuscular re-education;Patient/family education;Orthotic Fit/Training;Manual techniques;Scar mobilization;Passive range of motion;Dry needling;Energy conservation;Splinting;Taping;Spinal Manipulations;Joint Manipulations    PT Next Visit Plan n/a    PT Home Exercise Plan 2/10: decompression exercises 1-5, good seated posture 2/21 hip abd/add iso           Patient will benefit from skilled therapeutic intervention in order to improve the following deficits  and impairments:  Abnormal gait,Decreased endurance,Cardiopulmonary status limiting activity,Pain,Decreased activity tolerance,Decreased knowledge of use of DME,Decreased strength,Decreased mobility,Postural  dysfunction,Impaired flexibility,Improper body mechanics  Visit Diagnosis: Low back pain, unspecified back pain laterality, unspecified chronicity, unspecified whether sciatica present  Muscle weakness (generalized)  Other abnormalities of gait and mobility  Other symptoms and signs involving the musculoskeletal system     Problem List Patient Active Problem List   Diagnosis Date Noted  . Cervical spondylosis with myelopathy and radiculopathy 03/09/2020  . AKI (acute kidney injury) ([place]) 02/07/2019  . Acute respiratory failure with hypoxia ([place]) 02/06/2019  . Acute respiratory disease due to COVID-19 virus 02/06/2019  . Community acquired pneumonia 02/06/2019  . Hyponatremia 02/06/2019  . Asthma   . Diabetes mellitus without complication ([place])   . Closed nondisplaced odontoid fracture with type II morphology ([place]) 01/27/2018  . Odontoid fracture ([place]) 01/24/2018  . Chest pain 11/14/2017  . Transaminitis 06/22/2016  . Collagenous colitis 05/23/2016  . Psoriasis 05/21/2016  . High risk medication use 02/15/2016  . Age-related osteoporosis without current pathological fracture 02/15/2016  . Trochanteric bursitis, left hip 02/15/2016  . Sacroiliitis, not elsewhere classified ([place]) 02/15/2016  . Chronic pain syndrome 02/15/2016  . Abnormal weight gain 06/25/2012  . Hypertension 07/03/2011  . Hypercholesteremia 07/03/2011  . Psoriatic arthritis ([place]) 07/03/2011  . Osteoarthritis of multiple joints 07/03/2011  . Esophageal motility disorder 02/14/2011  . Cough 02/14/2011  . GERD 05/05/2010    12:07 PM, 05/25/20 Mearl Latin PT, DPT Physical Therapist at [facility] [place], [place], [address] Phone: [phone]   Fax:  [phone]  Name: CHARLES NIESE MRN: [id number] Date of Birth: 1951/03/27

## 2020-05-31 ENCOUNTER — Encounter (HOSPITAL_COMMUNITY): Payer: PPO

## 2020-06-02 ENCOUNTER — Encounter (HOSPITAL_COMMUNITY): Payer: PPO

## 2020-06-06 ENCOUNTER — Encounter (HOSPITAL_COMMUNITY): Payer: PPO | Admitting: Physical Therapy

## 2020-06-07 NOTE — Progress Notes (Signed)
Office Visit Note  Patient: Tonya Hall             Date of Birth: 03-01-1951           MRN: [id number]             PCP: [name], MD Referring: [name], MD Visit Date: 06/08/2020 Occupation: @GUAROCC @  Subjective:  Left hip pain  History of Present Illness: Tonya Hall is a 70 y.o. female with history of psoriatic arthritis, osteoarthritis, and DDD.  She is currently holding Cosentyx for upcoming lumbar spine surgery.  According to the patient she was supposed to have surgery on 05/19/2020 but her insurance declined coverage.  She will be following up with her neurosurgeon next week to further discuss the next steps and to finalize things before rescheduling surgery. She is continuing to take Arava 20 mg 1 tablet by mouth daily, Otezla 30 mg 1 tablet twice daily, and prednisone 5 mg by mouth daily until closer to surgery.  She has also continue to take natural anti-inflammatories including tart cherry, ginger, turmeric, and fish oil.  Her last dose of Cosentyx was mid-February 2022.  She presents today with significant discomfort due to trochanter bursitis of the left hip.  She requested a cortisone injection.  She states that she has been having difficulty falling asleep at night due to severity of pain secondary to radiculopathy in her right leg and discomfort due to trochanter bursitis on the left side.  She continues to have chronic fatigue secondary to insomnia.  She denies any Achilles denies or plantar fasciitis.  She continues to have chronic pain and stiffness in both hands.  She is also noticed increased discomfort in her right shoulder for the past 2 to 3 days. She has had intermittent symptoms of Raynaud's but denies any discoloration or signs of gangrene. She denies any recent infections.      Activities of Daily Living:  Patient reports morning stiffness for all day. Patient Reports nocturnal pain.  Difficulty dressing/grooming: Reports Difficulty climbing  stairs: Reports Difficulty getting out of chair: Reports Difficulty using hands for taps, buttons, cutlery, and/or writing: Reports  Review of Systems  Constitutional: Positive for fatigue.  HENT: Positive for mouth dryness. Negative for mouth sores and nose dryness.   Eyes: Positive for dryness. Negative for pain and itching.  Respiratory: Negative for shortness of breath and difficulty breathing.   Cardiovascular: Negative for chest pain and palpitations.  Gastrointestinal: Negative for blood in stool, constipation and diarrhea.  Endocrine: Negative for increased urination.  Genitourinary: Negative for difficulty urinating.  Musculoskeletal: Positive for arthralgias, joint pain, joint swelling, myalgias, morning stiffness, muscle tenderness and myalgias.  Skin: Positive for color change. Negative for rash and redness.  Allergic/Immunologic: Negative for susceptible to infections.  Neurological: Positive for numbness and headaches. Negative for dizziness, memory loss and weakness.  Hematological: Positive for bruising/bleeding tendency.  Psychiatric/Behavioral: Negative for confusion.    PMFS History:  Patient Active Problem List   Diagnosis Date Noted  . Cervical spondylosis with myelopathy and radiculopathy 03/09/2020  . AKI (acute kidney injury) ([place]) 02/07/2019  . Acute respiratory failure with hypoxia ([place]) 02/06/2019  . Acute respiratory disease due to COVID-19 virus 02/06/2019  . Community acquired pneumonia 02/06/2019  . Hyponatremia 02/06/2019  . Asthma   . Diabetes mellitus without complication ([place])   . Closed nondisplaced odontoid fracture with type II morphology ([place]) 01/27/2018  . Odontoid fracture ([place]) 01/24/2018  . Chest pain 11/14/2017  .  Transaminitis 06/22/2016  . Collagenous colitis 05/23/2016  . Psoriasis 05/21/2016  . High risk medication use 02/15/2016  . Age-related osteoporosis without current pathological fracture 02/15/2016  . Trochanteric  bursitis, left hip 02/15/2016  . Sacroiliitis, not elsewhere classified ([place]) 02/15/2016  . Chronic pain syndrome 02/15/2016  . Abnormal weight gain 06/25/2012  . Hypertension 07/03/2011  . Hypercholesteremia 07/03/2011  . Psoriatic arthritis ([place]) 07/03/2011  . Osteoarthritis of multiple joints 07/03/2011  . Esophageal motility disorder 02/14/2011  . Cough 02/14/2011  . GERD 05/05/2010    Past Medical History:  Diagnosis Date  . Asthma    Albuterol in haler prn  . Cataract    immature unsure which eye  . Chronic back pain   . COVID-19 October/November 2020  . Degenerative disk disease    psoriatic  . Diabetes mellitus without complication (HCC)    diet controlled  . Esophageal motility disorder    Non-specific, see modified barium study/speech path, BP  . GERD (gastroesophageal reflux disease)    takes Protonix bid  . Headache 2019  . History of blood transfusion    post c-section  . History of bronchitis   . HTN (hypertension)   . Hyperlipidemia    takes Pravastatin daily  . Lymphocytic colitis 05/26/2010   Responded to Entocort x 3 MOS  . Neuropathy   . Nonallergic rhinitis   . Osteoporosis    gets Boniva every 3 months  . Peripheral edema    takes HCTZ daily  . Pneumonia 02/2019  . Psoriatic arthritis ([place])    Dr. Katherina Right  . Raynaud's disease   . Seasonal allergies   . Shingles 2020  . SUI (stress urinary incontinence, female)   . Urinary urgency     Family History  Problem Relation Age of Onset  . Diabetes Mother   . Pancreatitis Mother   . Arthritis Mother        psoriatic   . Fibromyalgia Mother   . Rheumatic fever Father   . Diabetes Other        grandparents  . Skin cancer Other        grandfather  . Hypertension Other        grandparent  . Congestive Heart Failure Other        grandfather  . Parkinson's disease Other        grandmother  . Colon cancer Maternal Uncle   . Fibromyalgia Sister   . Rheum arthritis Sister   . Diabetes  Sister   . Fibromyalgia Sister   . Diabetes Sister   . Rheum arthritis Sister   . Hypertension Son   . Colon polyps Neg Hx    Past Surgical History:  Procedure Laterality Date  . ANTERIOR CERVICAL DECOMP/DISCECTOMY FUSION N/A 03/09/2020   Procedure: ANTERIOR CERVICAL DECOMPRESSION/DISCECTOMY FUSION, INTERBODY PROSTHESIS, PLATE SCREWS CERVICAL FIVE-SIX, CERVICAL SIX-SEVEN;  Surgeon: Newman Pies, MD;  Location: [facility];  Service: Neurosurgery;  Laterality: N/A;  . BACK SURGERY  2003  . BIOPSY  04/17/2016   Procedure: BIOPSY;  Surgeon: Danie Binder, MD;  [facility];  Service: Endoscopy;;  random colon bx's  . CERVICAL SPINE SURGERY  09/09/2017  . CESAREAN SECTION  1974, 1978      . COLONOSCOPY  06/19/2008   [facility]:URKYH internal hemorrhoids/mild sigmoin colon diverticulosis  . COLONOSCOPY  2012   Dr. Gala Romney: normal rectum, diverticula, lymphocytic colitis   . COLONOSCOPY WITH PROPOFOL N/A 04/17/2016   Dr. Oneida Alar: Normal terminal ileum, internal/external hemorrhoids,  random colon biopsies consistent with collagenous colitis  . DILATION AND CURETTAGE OF UTERUS  1977   spontaneous abortion  . ESOPHAGOGASTRODUODENOSCOPY  06/19/2008   [facility]:YSAYTKZS gastritis  . LUMBAR DISC ARTHROPLASTY  7/98  . LUMBAR FUSION  6/03, 11/08, 11/14   L4-5 fusion, L2-3 fusion, L1-2  . LUMBAR LAMINECTOMY  03/2020  . ODONTOID SCREW INSERTION N/A 01/27/2018   Procedure: ODONTOID SCREW INSERTION;  Surgeon: Newman Pies, MD;  Location: [facility];  Service: Neurosurgery;  Laterality: N/A;  ODONTOID SCREW INSERTION  . TONSILLECTOMY AND ADENOIDECTOMY    . TUBAL LIGATION     Social History   Social History Narrative   ATTENDS [employer]. 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   Immunization History  Administered Date(s) Administered  . Influenza,inj,Quad PF,6+ Mos 01/21/2017  . Influenza-Unspecified 01/30/2017  . Moderna Sars-Covid-2 Vaccination 06/01/2019, 07/02/2019  . Pneumococcal  Polysaccharide-23 02/19/2013  . Tdap 09/10/2016     Objective: Vital Signs: BP (!) 158/77 (BP Location: Left Arm, Patient Position: Sitting, Cuff Size: Normal)   Pulse 87   Resp 14   Ht 4\' 11"  (1.499 m)   Wt 141 lb 12.8 oz (64.3 kg)   LMP 02/01/1999   BMI 28.64 kg/m    Physical Exam Vitals and nursing note reviewed.  Constitutional:      Appearance: She is well-developed and well-nourished.  HENT:     Head: Normocephalic and atraumatic.  Eyes:     Extraocular Movements: EOM normal.     Conjunctiva/sclera: Conjunctivae normal.  Cardiovascular:     Pulses: Intact distal pulses.  Pulmonary:     Effort: Pulmonary effort is normal.  Abdominal:     Palpations: Abdomen is soft.  Musculoskeletal:     Cervical back: Normal range of motion.  Skin:    General: Skin is warm and dry.     Capillary Refill: Capillary refill takes less than 2 seconds.  Neurological:     Mental Status: She is alert and oriented to person, place, and time.  Psychiatric:        Mood and Affect: Mood and affect normal.        Behavior: Behavior normal.      Musculoskeletal Exam: C-spine has limited range of motion with lateral rotation.  Shoulder joints have good range of motion with some discomfort in the right shoulder.  Tenderness over the right AC joint.  Elbow joints and wrist joints have good range of motion with no discomfort.  She has severe PIP and DIP thickening with limited flexion and extension.  Synovitis over the right 3rd PIP and left 5th PIP joint. Hip joints have good range of motion with no discomfort.  Tenderness palpation over the left trochanteric bursa.  Knee joints have good range of motion with no warmth or effusion.  Ankle joints have good range of motion with no tenderness or inflammation.  No Achilles tendinitis or plantar fasciitis.  CDAI Exam: CDAI Score: -- Patient Global: --; Provider Global: -- Swollen: 2 ; Tender: 2  Joint Exam 06/08/2020      Right  Left  PIP 3   Swollen Tender     PIP 5     Swollen Tender     Investigation: No additional findings.  Imaging: No results found.  Recent Labs: Lab Results  Component Value Date   WBC 11.9 (H) 03/02/2020   HGB 12.8 03/02/2020   PLT 244 03/02/2020   NA 136 03/02/2020   K 4.3 03/02/2020   CL 104 03/02/2020  CO2 23 03/02/2020   GLUCOSE 136 03/02/2020   BUN 27 (H) 03/02/2020   CREATININE 0.80 03/02/2020   BILITOT 0.3 03/02/2020   ALKPHOS 56 02/10/2019   AST 30 03/02/2020   ALT 36 (H) 03/02/2020   PROT 6.4 03/02/2020   ALBUMIN 2.7 (L) 02/10/2019   CALCIUM 9.8 03/02/2020   GFRAA 87 03/02/2020   QFTBGOLD Negative 05/05/2015   QFTBGOLDPLUS NEGATIVE 09/03/2019    Speciality Comments: MTX- high LFTs, PLQ,SSZ,Imuran, Humira- allergy, Enbrel , Remicade - inadequate response  Procedures:  Large Joint Inj: L greater trochanter on 06/08/2020 4:38 PM Indications: pain Details: 27 G 1.5 in needle, lateral approach  Arthrogram: No  Medications: 40 mg triamcinolone acetonide 40 MG/ML; 1.5 mL lidocaine 1 % Aspirate: 0 mL Outcome: tolerated well, no immediate complications Procedure, treatment alternatives, risks and benefits explained, specific risks discussed. Consent was given by the patient. Immediately prior to procedure a time out was called to verify the correct patient, procedure, equipment, support staff and site/side marked as required. Patient was prepped and draped in the usual sterile fashion.     Allergies: Adalimumab, Imuran [azathioprine sodium], Methotrexate, Plaquenil [hydroxychloroquine sulfate], Sulfonamide derivatives, Ceftin [cefuroxime axetil], and Morphine   Assessment / Plan:     Visit Diagnoses: Psoriatic arthritis ([place]) - Severe psoriatic arthritis uncontrolled on current regimen.  She continues to have chronic pain in multiple joints including the right shoulder, both hands, occasionally the right knee, and SI joints.  She has not had any Achilles tendinitis or plantar  fasciitis.  No active psoriasis at this time.  On examination today she has tenderness and synovitis over the right third PIP and left fifth PIP joint.  She is currently prescribed Cosentyx 150 mg subcutaneous injections every 14 days, Arava 20 mg 1 tablet by mouth daily, Otezla 30 mg 1 tablet by mouth twice daily, and prednisone 5 mg by mouth daily.  Her last dose of Cosentyx was in mid February 2022.  She held her most recent dose in anticipation for a lumbar fusion which was scheduled for 05/19/2020.  Her surgery was canceled and has not been rescheduled yet.  At her last office visit with Dr. Estanislado Pandy on 04/05/2020 the plan was to discontinue taltz preoperatively and then start the patient on Taltz after she has been cleared during the postop period.  We will continue with the current plan.  She will require written clearance prior to scheduling a new start visit to start on [place].  She will continue on [place] as prescribed. She will follow up in 3 months.   Psoriasis: She has no active psoriasis at this time.  High risk medication use -Approved for Taltz.  Currently holding Cosentyx prior to scheduling lumbar fusion.  She will continue taking  Arava 20mg  po qd, Otezla 30 mg p.o. twice daily, and prednisone 5 mg daily.  CBC and CMP drawn on 03/02/20.  She is due to update lab work.  Orders for CBC and CMP released.  Her next lab work will be due in June and every 3 months to monitor for drug toxicity.  Standing orders for CBC and CMP are in place.  TB gold negative on 09/03/19.  Future order placed today.  She has not had any recent infections.  - Plan: CBC with Differential/Platelet, COMPLETE METABOLIC PANEL WITH GFR, QuantiFERON-TB Gold Plus Advised to hold Cosentyx and Arava if she develops signs or symptoms of an infection and to resume once infection has completely cleared. The plan is  to start her on Taltz once she has been cleared postoperatively.  She will require right hand surgical  clearance prior to scheduling new start visit for administration of the first Taltz injection.  Screening for tuberculosis -Future order for TB gold placed today.  Plan: QuantiFERON-TB Gold Plus  Chronic left SI joint pain: Chronic pain  Trochanteric bursitis, left hip: She presents today with acute on chronic pain due to trochanter bursitis of the left hip.  She has tenderness palpation on examination today.  She requested a left trochanteric bursa cortisone injection.  She tolerated the procedure well.  Aftercare was discussed.  Advised to monitor blood pressure and blood sugar closely after the cortisone injection performed today.   Primary osteoarthritis of right knee: She has good range of motion of the right knee joint on exam.  No warmth or effusion was noted.  She is occasional pain and stiffness in her right knee and uses Voltaren gel topically as needed for pain relief.  Raynaud's syndrome without gangrene: She has been experiencing intermittent symptoms of Raynaud's.  She has good capillary refill on exam.  No digital ulcerations or signs of gangrene were noted.  DDD (degenerative disc disease), cervical: S/p fusion.  She has limited range of motion with lateral rotation.  No symptoms of radiculopathy at this time.   DDD (degenerative disc disease), thoracic: She is not experiencing any discomfort in her thoracic spine currently.  DDD (degenerative disc disease), lumbar:S/p fusion: Chronic pain.  She has been following up with Dr. Arnoldo Morale closely.  She had an MRI of the lumbar spine on 04/11/2020.  according to the patient she was scheduled for a lumbar fusion on 05/19/2020 which had to be postponed due to issues with insurance coverage.  She continues to have symptoms of radiculopathy down her right lower extremity.  She has a follow-up appointment scheduled with Dr. Arnoldo Morale next week to discuss rescheduling surgery. Her last dose of Cosentyx was at the beginning of February 2022. She  will need to be cleared by Dr. Arnoldo Morale prior to starting on taltz.  Advised to obtain surgical clearance after surgery.  We also recommend referral for 1 month from her last Cosentyx injection.  Age-related osteoporosis without current pathological fracture - According to the patient her PCP orders her bone density.  She was recently switched from Reclast to Prolia due to having several fragility fractures.  She continues to take a calcium and vitamin D supplement.  Chronic pain syndrome -She takes hydrocodone 1 tablet by mouth 2-3 times daily as needed for pain.   Other medical conditions are listed as follows:  Esophageal motility disorder  Collagenous colitis  Adrenal insufficiency (HCC) - Followed by Dr. Chalmers Cater  Senile purpura [facility])  History of gastroesophageal reflux (GERD)  History of type 2 diabetes mellitus - Followed by Dr. Chalmers Cater  Essential hypertension    Orders: Orders Placed This Encounter  Procedures  . Large Joint Inj  . CBC with Differential/Platelet  . COMPLETE METABOLIC PANEL WITH GFR  . QuantiFERON-TB Gold Plus   No orders of the defined types were placed in this encounter.     Follow-Up Instructions: Return in about 3 months (around 09/08/2020) for Psoriatic arthritis, Osteoarthritis, DDD.   Ofilia Neas, PA-C  Note - This record has been created using Dragon software.  Chart creation errors have been sought, but may not always  have been located. Such creation errors do not reflect on  the standard of medical care.

## 2020-06-08 ENCOUNTER — Encounter (HOSPITAL_COMMUNITY): Payer: PPO

## 2020-06-08 ENCOUNTER — Encounter: Payer: Self-pay | Admitting: Physician Assistant

## 2020-06-08 ENCOUNTER — Other Ambulatory Visit: Payer: Self-pay

## 2020-06-08 ENCOUNTER — Ambulatory Visit: Payer: PPO | Admitting: Physician Assistant

## 2020-06-08 VITALS — BP 158/77 | HR 87 | Resp 14 | Ht 59.0 in | Wt 141.8 lb

## 2020-06-08 DIAGNOSIS — Z8639 Personal history of other endocrine, nutritional and metabolic disease: Secondary | ICD-10-CM

## 2020-06-08 DIAGNOSIS — M7062 Trochanteric bursitis, left hip: Secondary | ICD-10-CM | POA: Diagnosis not present

## 2020-06-08 DIAGNOSIS — M5134 Other intervertebral disc degeneration, thoracic region: Secondary | ICD-10-CM

## 2020-06-08 DIAGNOSIS — Z79899 Other long term (current) drug therapy: Secondary | ICD-10-CM | POA: Diagnosis not present

## 2020-06-08 DIAGNOSIS — Z8719 Personal history of other diseases of the digestive system: Secondary | ICD-10-CM

## 2020-06-08 DIAGNOSIS — M79642 Pain in left hand: Secondary | ICD-10-CM

## 2020-06-08 DIAGNOSIS — K224 Dyskinesia of esophagus: Secondary | ICD-10-CM

## 2020-06-08 DIAGNOSIS — M810 Age-related osteoporosis without current pathological fracture: Secondary | ICD-10-CM

## 2020-06-08 DIAGNOSIS — G8929 Other chronic pain: Secondary | ICD-10-CM

## 2020-06-08 DIAGNOSIS — M79641 Pain in right hand: Secondary | ICD-10-CM

## 2020-06-08 DIAGNOSIS — L4050 Arthropathic psoriasis, unspecified: Secondary | ICD-10-CM

## 2020-06-08 DIAGNOSIS — G894 Chronic pain syndrome: Secondary | ICD-10-CM

## 2020-06-08 DIAGNOSIS — M5136 Other intervertebral disc degeneration, lumbar region: Secondary | ICD-10-CM

## 2020-06-08 DIAGNOSIS — Z111 Encounter for screening for respiratory tuberculosis: Secondary | ICD-10-CM

## 2020-06-08 DIAGNOSIS — D692 Other nonthrombocytopenic purpura: Secondary | ICD-10-CM

## 2020-06-08 DIAGNOSIS — M5030 Other cervical disc degeneration, unspecified cervical region: Secondary | ICD-10-CM | POA: Diagnosis not present

## 2020-06-08 DIAGNOSIS — I7300 Raynaud's syndrome without gangrene: Secondary | ICD-10-CM

## 2020-06-08 DIAGNOSIS — L409 Psoriasis, unspecified: Secondary | ICD-10-CM

## 2020-06-08 DIAGNOSIS — M1711 Unilateral primary osteoarthritis, right knee: Secondary | ICD-10-CM

## 2020-06-08 DIAGNOSIS — M533 Sacrococcygeal disorders, not elsewhere classified: Secondary | ICD-10-CM

## 2020-06-08 DIAGNOSIS — K52831 Collagenous colitis: Secondary | ICD-10-CM

## 2020-06-08 DIAGNOSIS — I10 Essential (primary) hypertension: Secondary | ICD-10-CM

## 2020-06-08 DIAGNOSIS — E2740 Unspecified adrenocortical insufficiency: Secondary | ICD-10-CM

## 2020-06-08 MED ORDER — LIDOCAINE HCL 1 % IJ SOLN
1.5000 mL | INTRAMUSCULAR | Status: AC | PRN
  Administered 2020-06-08: 1.5 mL

## 2020-06-08 MED ORDER — TRIAMCINOLONE ACETONIDE 40 MG/ML IJ SUSP
40.0000 mg | INTRAMUSCULAR | Status: AC | PRN
  Administered 2020-06-08: 40 mg via INTRA_ARTICULAR

## 2020-06-08 NOTE — Patient Instructions (Signed)
Standing Labs We placed an order today for your standing lab work.   Please have your standing labs drawn in June and every 3 months   If possible, please have your labs drawn 2 weeks prior to your appointment so that the provider can discuss your results at your appointment.  We have open lab daily Monday through Thursday from 1:30-4:30 PM and Friday from 1:30-4:00 PM at the office of Dr. Shaili Deveshwar, [facility].   Please be advised, all patients with office appointments requiring lab work will take precedents over walk-in lab work.  If possible, please come for your lab work on Monday and Friday afternoons, as you may experience shorter wait times. The office is located at [address], [address], [place], [place] [address] No appointment is necessary.   Labs are drawn by [facility]. Please bring your co-pay at the time of your lab draw.  You may receive a bill from [facility] for your lab work.  If you wish to have your labs drawn at another location, please call the office 24 hours in advance to send orders.  If you have any questions regarding directions or hours of operation,  please call [phone].   As a reminder, please drink plenty of water prior to coming for your lab work. Thanks!   

## 2020-06-09 ENCOUNTER — Other Ambulatory Visit: Payer: Self-pay | Admitting: Physician Assistant

## 2020-06-09 LAB — CBC WITH DIFFERENTIAL/PLATELET
Absolute Monocytes: 731 cells/uL (ref 200–950)
Basophils Absolute: 139 cells/uL (ref 0–200)
Basophils Relative: 1.2 %
Eosinophils Absolute: 336 cells/uL (ref 15–500)
Eosinophils Relative: 2.9 %
HCT: 39.9 % (ref 35.0–45.0)
Hemoglobin: 13 g/dL (ref 11.7–15.5)
Lymphs Abs: 1694 cells/uL (ref 850–3900)
MCH: 30.8 pg (ref 27.0–33.0)
MCHC: 32.6 g/dL (ref 32.0–36.0)
MCV: 94.5 fL (ref 80.0–100.0)
MPV: 12.1 fL (ref 7.5–12.5)
Monocytes Relative: 6.3 %
Neutro Abs: 8700 cells/uL — ABNORMAL HIGH (ref 1500–7800)
Neutrophils Relative %: 75 %
Platelets: 287 10*3/uL (ref 140–400)
RBC: 4.22 10*6/uL (ref 3.80–5.10)
RDW: 12.6 % (ref 11.0–15.0)
Total Lymphocyte: 14.6 %
WBC: 11.6 10*3/uL — ABNORMAL HIGH (ref 3.8–10.8)

## 2020-06-09 LAB — COMPLETE METABOLIC PANEL WITH GFR
AG Ratio: 1.8 (calc) (ref 1.0–2.5)
ALT: 27 U/L (ref 6–29)
AST: 22 U/L (ref 10–35)
Albumin: 4.3 g/dL (ref 3.6–5.1)
Alkaline phosphatase (APISO): 53 U/L (ref 37–153)
BUN/Creatinine Ratio: 27 (calc) — ABNORMAL HIGH (ref 6–22)
BUN: 27 mg/dL — ABNORMAL HIGH (ref 7–25)
CO2: 21 mmol/L (ref 20–32)
Calcium: 10.2 mg/dL (ref 8.6–10.4)
Chloride: 103 mmol/L (ref 98–110)
Creat: 1.01 mg/dL — ABNORMAL HIGH (ref 0.60–0.93)
GFR, Est African American: 65 mL/min/{1.73_m2} (ref >=60)
GFR, Est Non African American: 56 mL/min/{1.73_m2} — ABNORMAL LOW (ref >=60)
Globulin: 2.4 g/dL (calc) (ref 1.9–3.7)
Glucose, Bld: 136 mg/dL — ABNORMAL HIGH (ref 65–99)
Potassium: 4.5 mmol/L (ref 3.5–5.3)
Sodium: 136 mmol/L (ref 135–146)
Total Bilirubin: 0.2 mg/dL (ref 0.2–1.2)
Total Protein: 6.7 g/dL (ref 6.1–8.1)

## 2020-06-09 NOTE — Progress Notes (Signed)
CBC stable. Creatinine is borderline elevated-1.01 and GFR is low at 56.  Please advise the patient to avoid NSAIDs.   Glucose is elevated-136. Please advise the patient to monitor blood glucose closely following the cortisone injection yesterday.

## 2020-06-09 NOTE — Telephone Encounter (Signed)
Next Visit: 09/14/2020  Last Visit: 06/08/2020  Last Fill: 12/17/2019  Dx: Psoriatic arthritis   Current Dose per office note on 06/08/2020, prednisone 5 mg by mouth daily  Okay to refill Prednisone?

## 2020-06-14 DIAGNOSIS — Z6828 Body mass index (BMI) 28.0-28.9, adult: Secondary | ICD-10-CM | POA: Diagnosis not present

## 2020-06-14 DIAGNOSIS — M5416 Radiculopathy, lumbar region: Secondary | ICD-10-CM | POA: Diagnosis not present

## 2020-06-14 DIAGNOSIS — M48061 Spinal stenosis, lumbar region without neurogenic claudication: Secondary | ICD-10-CM | POA: Diagnosis not present

## 2020-06-17 ENCOUNTER — Telehealth: Payer: Self-pay

## 2020-06-17 NOTE — Telephone Encounter (Signed)
Please advise 

## 2020-06-17 NOTE — Telephone Encounter (Signed)
Patient called stating she has a meeting with a Medicare Judge on 06/30/19 to determine if she will be approved for her back surgery.  Patient states her doctor told her even if it is approved on the 30th she won't be able to schedule surgery until the end of April or middle of May.  Patient is requesting a return to discuss starting the Taltz medication or if she should go back on the Cosentyx.  Patient states it has been a while since she has taken the Cosentyx and needs something to relieve her symptoms.

## 2020-06-18 ENCOUNTER — Encounter: Payer: Self-pay | Admitting: Emergency Medicine

## 2020-06-18 ENCOUNTER — Other Ambulatory Visit: Payer: Self-pay

## 2020-06-18 ENCOUNTER — Ambulatory Visit
Admission: EM | Admit: 2020-06-18 | Discharge: 2020-06-18 | Disposition: A | Discharge status: Home / Self Care | Payer: PPO | Attending: Emergency Medicine | Admitting: Emergency Medicine

## 2020-06-18 DIAGNOSIS — K05219 Aggressive periodontitis, localized, unspecified severity: Secondary | ICD-10-CM | POA: Diagnosis not present

## 2020-06-18 DIAGNOSIS — K0889 Other specified disorders of teeth and supporting structures: Secondary | ICD-10-CM

## 2020-06-18 MED ORDER — CLINDAMYCIN HCL 300 MG PO CAPS: 300.0000 mg | ORAL_CAPSULE | Freq: Three times a day (TID) | ORAL | 0 refills | Status: AC

## 2020-06-18 MED ORDER — DEXAMETHASONE SODIUM PHOSPHATE 10 MG/ML IJ SOLN
10.0000 mg | Freq: Once | INTRAMUSCULAR | Status: AC
  Administered 2020-06-18: 10 mg via INTRAMUSCULAR

## 2020-06-18 MED ORDER — PREDNISONE 20 MG PO TABS: 20.0000 mg | ORAL_TABLET | Freq: Two times a day (BID) | ORAL | 0 refills | Status: AC

## 2020-06-18 NOTE — ED Provider Notes (Signed)
[place]   [id number] 06/18/20 Arrival Time: 1257  CC: DENTAL PAIN  SUBJECTIVE:  LONDON NONAKA is a 70 y.o. female who presents with possible mouth abscess x this morning.  Denies a precipitating event or trauma.  Had a root canal this month ago.  Localizes pain to top RT gum.  Has tried norco with minimal relief.  Worse with chewing.  Does see a dentist regularly.  Denies fever, chills, dysphagia, odynophagia, oral or neck swelling, nausea, vomiting, chest pain, SOB.    ROS: As per HPI.  All other pertinent ROS negative.     Past Medical History:  Diagnosis Date  . Asthma    Albuterol in haler prn  . Cataract    immature unsure which eye  . Chronic back pain   . COVID-19 October/November 2020  . Degenerative disk disease    psoriatic  . Diabetes mellitus without complication (HCC)    diet controlled  . Esophageal motility disorder    Non-specific, see modified barium study/speech path, BP  . GERD (gastroesophageal reflux disease)    takes Protonix bid  . Headache 2019  . History of blood transfusion    post c-section  . History of bronchitis   . HTN (hypertension)   . Hyperlipidemia    takes Pravastatin daily  . Lymphocytic colitis 05/26/2010   Responded to Entocort x 3 MOS  . Neuropathy   . Nonallergic rhinitis   . Osteoporosis    gets Boniva every 3 months  . Peripheral edema    takes HCTZ daily  . Pneumonia 02/2019  . Psoriatic arthritis ([place])    Dr. Katherina Right  . Raynaud's disease   . Seasonal allergies   . Shingles 2020  . SUI (stress urinary incontinence, female)   . Urinary urgency    Past Surgical History:  Procedure Laterality Date  . ANTERIOR CERVICAL DECOMP/DISCECTOMY FUSION N/A 03/09/2020   Procedure: ANTERIOR CERVICAL DECOMPRESSION/DISCECTOMY FUSION, INTERBODY PROSTHESIS, PLATE SCREWS CERVICAL FIVE-SIX, CERVICAL SIX-SEVEN;  Surgeon: Newman Pies, MD;  Location: [facility];  Service: Neurosurgery;  Laterality: N/A;  . BACK SURGERY   2003  . BIOPSY  04/17/2016   Procedure: BIOPSY;  Surgeon: Danie Binder, MD;  [facility];  Service: Endoscopy;;  random colon bx's  . CERVICAL SPINE SURGERY  09/09/2017  . CESAREAN SECTION  1974, 1978      . COLONOSCOPY  06/19/2008   UDJ:SHFWY internal hemorrhoids/mild sigmoin colon diverticulosis  . COLONOSCOPY  2012   Dr. Gala Romney: normal rectum, diverticula, lymphocytic colitis   . COLONOSCOPY WITH PROPOFOL N/A 04/17/2016   Dr. Oneida Alar: Normal terminal ileum, internal/external hemorrhoids, random colon biopsies consistent with collagenous colitis  . DILATION AND CURETTAGE OF UTERUS  1977   spontaneous abortion  . ESOPHAGOGASTRODUODENOSCOPY  06/19/2008   OVZ:CHYIFOYD gastritis  . LUMBAR DISC ARTHROPLASTY  7/98  . LUMBAR FUSION  6/03, 11/08, 11/14   L4-5 fusion, L2-3 fusion, L1-2  . LUMBAR LAMINECTOMY  03/2020  . ODONTOID SCREW INSERTION N/A 01/27/2018   Procedure: ODONTOID SCREW INSERTION;  Surgeon: Newman Pies, MD;  Location: [facility];  Service: Neurosurgery;  Laterality: N/A;  ODONTOID SCREW INSERTION  . TONSILLECTOMY AND ADENOIDECTOMY    . TUBAL LIGATION     Allergies  Allergen Reactions  . Adalimumab Rash and Other (See Comments)    FEVER, Fast pulse  . Imuran [Azathioprine Sodium] Rash    Denies Airway involvement  . Methotrexate Other (See Comments)    Liver Enzyme elevation - after  1 month of use  . Plaquenil [Hydroxychloroquine Sulfate] Rash    Denies airway involvement.   . Sulfonamide Derivatives Rash    Occurred with Sulfasalazine. Rash only.   . Ceftin [Cefuroxime Axetil] Nausea Only    Upset stomach  . Morphine Nausea And Vomiting    IV morphine caused N and V  After surgery.   Has tolerated Kadian in the past    No current facility-administered medications on file prior to encounter.   Current Outpatient Medications on File Prior to Encounter  Medication Sig Dispense Refill  . albuterol (PROVENTIL HFA;VENTOLIN HFA) 108 (90 BASE) MCG/ACT inhaler  Inhale 2 puffs into the lungs every 6 (six) hours as needed for wheezing or shortness of breath.     . Apremilast (OTEZLA) 30 MG TABS Take 30 mg by mouth 2 (two) times daily.    Marland Kitchen ascorbic acid (VITAMIN C) 500 MG tablet Take 500 mg by mouth 2 (two) times daily.    Marland Kitchen aspirin 81 MG tablet Take 81 mg by mouth daily.    Marland Kitchen azelastine (ASTELIN) 0.1 % nasal spray Place 1 spray into both nostrils 2 (two) times daily.     . Calcium Carb-Cholecalciferol (CALCIUM 600 + D PO) Take 1 tablet by mouth 2 (two) times daily.     Marland Kitchen denosumab (PROLIA) 60 MG/ML SOSY injection Inject 60 mg into the skin every 6 (six) months.    . fish oil-omega-3 fatty acids 1000 MG capsule Take 1 g by mouth daily.    Marland Kitchen gabapentin (NEURONTIN) 300 MG capsule Take 300 mg by mouth See admin instructions. Take 300 mg 3 times daily, may take a 4th 300 mg dose as needed for pain    . Ginger, Zingiber officinalis, (GINGER ROOT) 500 MG CAPS Take 500 mg by mouth daily.    . Glucosamine Sulfate 1000 MG CAPS Take 2 capsules (2,000 mg total) by mouth daily.    Marland Kitchen HUMULIN N KWIKPEN 100 UNIT/ML Kiwkpen Inject 10 Units into the skin at bedtime.    Marland Kitchen HYDROcodone-acetaminophen (NORCO/VICODIN) 5-325 MG tablet Take 1 tablet by mouth 2-3 times daily as needed for pain 90 tablet 0  . hydrocortisone sodium succinate (SOLU-CORTEF) 100 MG SOLR injection Inject 100 mg into the muscle daily as needed (when unable to keep down prednisone tablet).     Marland Kitchen ipratropium (ATROVENT) 0.06 % nasal spray Place 2 sprays into both nostrils 3 (three) times daily as needed for rhinitis.     . Lancets (ONETOUCH DELICA PLUS [id number]) MISC USE TO TEST 3 TIMESUDAILY.    Marland Kitchen leflunomide (ARAVA) 20 MG tablet TAKE 1 TABLET BY MOUTH ONCE DAILY. 90 tablet 0  . losartan (COZAAR) 100 MG tablet Take 100 mg by mouth daily.    . LUTEIN PO Take 350 mg by mouth 3 (three) times a week.    . metoprolol succinate (TOPROL-XL) 50 MG 24 hr tablet Take 1 tablet (50 mg total) by mouth daily. Take with  or immediately following a meal.    . Misc Natural Products (TART CHERRY ADVANCED PO) Take 1 tablet by mouth daily. daily    . Multiple Vitamin (MULTIVITAMIN) tablet Take 1 tablet by mouth daily.    . nortriptyline (PAMELOR) 25 MG capsule TAKE (1) CAPSULE BY MOUTH AT BEDTIME. (Patient taking differently: Take 25 mg by mouth at bedtime.) 30 capsule 0  . omeprazole (PRILOSEC) 20 MG capsule TAKE ONE CAPSULE BY MOUTH TWICE DAILY BEFORE MEALS. 180 capsule 2  . ONETOUCH VERIO test strip     .  potassium chloride SA (K-DUR,KLOR-CON) 20 MEQ tablet Take 20 mEq by mouth daily.     . Secukinumab (COSENTYX SENSOREADY PEN) 150 MG/ML SOAJ Inject 150 mg into the skin every 14 (fourteen) days. (Patient not taking: Reported on 06/08/2020) 2 mL 0  . spironolactone (ALDACTONE) 25 MG tablet Take 25 mg by mouth daily.    . SURE COMFORT INSULIN SYRINGE 31G X 5/16" 0.3 ML MISC     . SURE COMFORT PEN NEEDLES 31G X 5 MM MISC     . topiramate (TOPAMAX) 200 MG tablet TAKE (1) TABLET BY MOUTH AT BEDTIME. 90 tablet 0  . Turmeric 500 MG CAPS Take 500 mg by mouth at bedtime.    . [DISCONTINUED] Glucosamine 500 MG TABS Take 4 tablets by mouth daily.      . [DISCONTINUED] simvastatin (ZOCOR) 40 MG tablet Take 20 mg by mouth at bedtime.      Social History   Socioeconomic History  . Marital status: Married    Spouse name: Not on file  . Number of children: Not on file  . Years of education: Not on file  . Highest education level: Not on file  Occupational History  . Occupation: Case Education officer, environmental: [employer]    Comment: Engineer, technical sales  Tobacco Use  . Smoking status: Never Smoker  . Smokeless tobacco: Never Used  Vaping Use  . Vaping Use: Never used  Substance and Sexual Activity  . Alcohol use: No  . Drug use: No  . Sexual activity: Not Currently    Partners: Male    Birth control/protection: Surgical    Comment: BTL  Other Topics Concern  . Not on file  Social History Narrative   ATTENDS [employer].  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   Social Determinants of Health   Financial Resource Strain: Not on file  Food Insecurity: Not on file  Transportation Needs: Not on file  Physical Activity: Not on file  Stress: Not on file  Social Connections: Not on file  Intimate Partner Violence: Not on file   Family History  Problem Relation Age of Onset  . Diabetes Mother   . Pancreatitis Mother   . Arthritis Mother        psoriatic   . Fibromyalgia Mother   . Rheumatic fever Father   . Diabetes Other        grandparents  . Skin cancer Other        grandfather  . Hypertension Other        grandparent  . Congestive Heart Failure Other        grandfather  . Parkinson's disease Other        grandmother  . Colon cancer Maternal Uncle   . Fibromyalgia Sister   . Rheum arthritis Sister   . Diabetes Sister   . Fibromyalgia Sister   . Diabetes Sister   . Rheum arthritis Sister   . Hypertension Son   . Colon polyps Neg Hx     OBJECTIVE:  Vitals:   06/18/20 1308  BP: (!) 163/86  Pulse: (!) 101  Resp: 18  Temp: 97.6 F (36.4 C)  TempSrc: Tympanic  SpO2: 97%    General appearance: alert; no distress HENT: normocephalic; atraumatic; dentition: fair; fair dentition over right upper gums with abscess over RT upper gum; tolerating own secretions Neck: supple without LAD Lungs: normal respirations Skin: warm and dry Psychological: alert and cooperative; normal mood and affect  ASSESSMENT & PLAN:  1.  Abscess of upper gum   2. Pain, dental     Meds ordered this encounter  Medications  . clindamycin (CLEOCIN) 300 MG capsule    Sig: Take 1 capsule (300 mg total) by mouth 3 (three) times daily for 10 days.    Dispense:  30 capsule    Refill:  0    Order Specific Question:   Supervising Provider    Answer:   Raylene Everts [[id number]]  . predniSONE (DELTASONE) 20 MG tablet    Sig: Take 1 tablet (20 mg total) by mouth 2 (two) times daily with a meal for 5 days.    Dispense:   10 tablet    Refill:  0    Order Specific Question:   Supervising Provider    Answer:   Raylene Everts [[id number]]  . dexamethasone (DECADRON) injection 10 mg    Steroid shot given in office Prednisone prescribed.   Clindamycin prescribed.   Take medications as directed and to completion Recommend soft diet until evaluated by dentist Maintain oral hygiene care Follow up with dentist as soon as possible for further evaluation and treatment  Return or go to the ED if you have any new or worsening symptoms such as fever, chills, difficulty swallowing, painful swallowing, oral or neck swelling, nausea, vomiting, chest pain, SOB, etc...  Reviewed expectations re: course of current medical issues. Questions answered. Outlined signs and symptoms indicating need for more acute intervention. Patient verbalized understanding. After Visit Summary given.   Lestine Box, PA-C 06/18/20 1339

## 2020-06-18 NOTE — ED Triage Notes (Signed)
Had a root canal about a month ago.  Woke up this morning with right sided facial swelling.

## 2020-06-18 NOTE — Discharge Instructions (Addendum)
Steroid shot given in office Prednisone prescribed.   Clindamycin prescribed.   Take medications as directed and to completion Recommend soft diet until evaluated by dentist Maintain oral hygiene care Follow up with dentist as soon as possible for further evaluation and treatment  Return or go to the ED if you have any new or worsening symptoms such as fever, chills, difficulty swallowing, painful swallowing, oral or neck swelling, nausea, vomiting, chest pain, SOB, etc..Marland Kitchen

## 2020-06-20 NOTE — Telephone Encounter (Signed)
Please see Dr. Arlean Hopping note. Thank you.

## 2020-06-20 NOTE — Telephone Encounter (Signed)
I called patient and discussed that she should take Cosentyx dose until 1 month prior to her surgery.  We will plan on starting her on loading with Taltz postoperatively.

## 2020-06-21 NOTE — Telephone Encounter (Signed)
Called patient and left message that she can call in refills to [employer] and to contact the office with any further questions.

## 2020-06-21 NOTE — Telephone Encounter (Signed)
[employer], patient was APPROVED for Cosentyx patient assistance through 04/01/21. They will refax approval letter to office. Rep states patient currently has 2 refills on file.  Phone# [phone]

## 2020-06-23 ENCOUNTER — Other Ambulatory Visit: Payer: Self-pay

## 2020-06-23 MED ORDER — HYDROCODONE-ACETAMINOPHEN 5-325 MG PO TABS: ORAL_TABLET | ORAL | 0 refills | Status: DC

## 2020-06-23 NOTE — Telephone Encounter (Signed)
Last Visit: 06/08/2020 Next Visit: 09/14/2020 UDS: 02/11/2020, Labs are consistent with medication use. Narc Agreement: 04/20/2019  Last Fill: 04/15/2020  Chronic pain syndrome -She takes hydrocodone 1 tablet by mouth 2-3 times daily as needed for pain.    Okay to refill Hydrocodone?

## 2020-06-23 NOTE — Telephone Encounter (Signed)
I spoke with [place].  She states she took oxycodone only for short time for lower back pain been prescribed by Dr. Arnoldo Morale.  She did not take any hydrocodone during that time.  She resume hydrocodone per our instructions.  She would prefer to stay on hydrocodone as oxycodone causes more side effects.  I offered referral to pain management but she declined at this point.

## 2020-06-23 NOTE — Telephone Encounter (Signed)
Patient called requesting prescription refill of Hydrocodone to be sent to [facility] in [place].

## 2020-07-05 ENCOUNTER — Ambulatory Visit: Payer: PPO | Admitting: Physician Assistant

## 2020-07-13 ENCOUNTER — Telehealth: Payer: Self-pay | Admitting: Rheumatology

## 2020-07-13 ENCOUNTER — Other Ambulatory Visit: Payer: Self-pay | Admitting: Neurosurgery

## 2020-07-13 NOTE — Telephone Encounter (Signed)
Patient calling because her surgery has been scheduled for 08/03/2020. Patient needs to know what she can continue to take, what to hold,  And when to hold it. Please call to advise.

## 2020-07-14 NOTE — Telephone Encounter (Signed)
Advised patient to hold Otezla and leflunomide 1 week before surgery. Her last Cosentyx dose was 3 weeks ago. Advised her to hold Cosentyx dose. Discused that she will require post-op clearance by Dr. Arnoldo Morale to start Donnetta Hail. Patient verbalized understanding.  Knox Saliva, PharmD, MPH Clinical Pharmacist (Rheumatology and Pulmonology)

## 2020-07-19 ENCOUNTER — Other Ambulatory Visit: Payer: Self-pay | Admitting: Rheumatology

## 2020-07-19 DIAGNOSIS — L409 Psoriasis, unspecified: Secondary | ICD-10-CM

## 2020-07-19 MED ORDER — OTEZLA 30 MG PO TABS: 30.0000 mg | ORAL_TABLET | Freq: Two times a day (BID) | ORAL | 0 refills | Status: DC

## 2020-07-19 NOTE — Telephone Encounter (Signed)
Patient needs refill on Otezla sent to mail order pharmacy.

## 2020-07-19 NOTE — Telephone Encounter (Signed)
Next Visit: 09/14/2020  Last Visit: 06/08/2020  Last Fill: 10/13/2019  DX:   Psoriatic arthritis   Current Dose per office note 06/08/2020, Otezla 30 mg p.o. twice daily  Labs: 06/08/2020, CBC stable. Creatinine is borderline elevated-1.01 and GFR is low at 56. Please advise the patient to avoid NSAIDs.   Glucose is elevated-136. Please advise the patient to monitor blood glucose closely following the cortisone injection yesterday.   Okay to refill [name]?

## 2020-07-21 MED ORDER — OTEZLA 30 MG PO TABS: 30.0000 mg | ORAL_TABLET | Freq: Two times a day (BID) | ORAL | 0 refills | Status: DC

## 2020-07-21 NOTE — Addendum Note (Signed)
Addended by: Cassandria Anger on: 07/21/2020 08:10 AM   Modules accepted: Orders

## 2020-07-22 ENCOUNTER — Ambulatory Visit: Payer: PPO

## 2020-07-25 ENCOUNTER — Ambulatory Visit (HOSPITAL_COMMUNITY)
Admission: RE | Admit: 2020-07-25 | Discharge: 2020-07-25 | Disposition: A | Discharge status: Home / Self Care | Payer: PPO | Source: Ambulatory Visit | Attending: Internal Medicine | Admitting: Internal Medicine

## 2020-07-25 ENCOUNTER — Other Ambulatory Visit (HOSPITAL_COMMUNITY): Payer: Self-pay | Admitting: Internal Medicine

## 2020-07-25 ENCOUNTER — Other Ambulatory Visit: Payer: Self-pay

## 2020-07-25 DIAGNOSIS — R0781 Pleurodynia: Secondary | ICD-10-CM

## 2020-07-25 DIAGNOSIS — M4182 Other forms of scoliosis, cervical region: Secondary | ICD-10-CM | POA: Diagnosis not present

## 2020-07-25 DIAGNOSIS — J984 Other disorders of lung: Secondary | ICD-10-CM | POA: Diagnosis not present

## 2020-07-25 DIAGNOSIS — M19011 Primary osteoarthritis, right shoulder: Secondary | ICD-10-CM | POA: Diagnosis not present

## 2020-07-25 DIAGNOSIS — M4186 Other forms of scoliosis, lumbar region: Secondary | ICD-10-CM | POA: Diagnosis not present

## 2020-07-28 ENCOUNTER — Other Ambulatory Visit (HOSPITAL_COMMUNITY): Payer: Self-pay | Admitting: Internal Medicine

## 2020-07-28 ENCOUNTER — Other Ambulatory Visit: Payer: Self-pay | Admitting: Internal Medicine

## 2020-07-28 DIAGNOSIS — R911 Solitary pulmonary nodule: Secondary | ICD-10-CM

## 2020-07-29 NOTE — Progress Notes (Addendum)
Surgical Instructions    Your procedure is scheduled on 08/03/20.  Report to [facility] Main Entrance "A" at 06:30 A.M., then check in with the Admitting office.  Call this number if you have problems the morning of surgery:  [phone]   If you have any questions prior to your surgery date call [phone]: Open Monday-Friday 8am-4pm    Remember:  Do not eat or drink after midnight the night before your surgery     Take these medicines the morning of surgery with A SIP OF WATER  albuterol (PROVENTIL HFA;VENTOLIN HFA) as needed for shortness of breath or wheezing azelastine (ASTELIN) nasal spray gabapentin (NEURONTIN) HYDROcodone-acetaminophen (NORCO/VICODIN) as needed for pain ipratropium (ATROVENT) as needed metoprolol succinate (TOPROL-XL) omeprazole (PRILOSEC)   Hold Apremilast (OTEZLA) and leflunomide (ARAVA) 1 week prior to surgery.  As of today, STOP taking any Aspirin (unless otherwise instructed by your surgeon) Aleve, Naproxen, Ibuprofen, Motrin, Advil, Goody's, BC's, all herbal medications, fish oil, and all vitamins.   WHAT DO I DO ABOUT MY DIABETES MEDICATION?   Marland Kitchen Do not take oral diabetes medicines (pills) the morning of surgery.  . THE NIGHT BEFORE SURGERY, take 5 units of HUMULIN N KWIKPEN .     Marland Kitchen The day of surgery, do not take other diabetes injectables, including Byetta (exenatide), Bydureon (exenatide ER), Victoza (liraglutide), or Trulicity (dulaglutide).  . If your CBG is greater than 220 mg/dL, you may take  of your sliding scale (correction) dose of insulin.   HOW TO MANAGE YOUR DIABETES BEFORE AND AFTER SURGERY  Why is it important to control my blood sugar before and after surgery? . Improving blood sugar levels before and after surgery helps healing and can limit problems. . A way of improving blood sugar control is eating a healthy diet by: o  Eating less sugar and carbohydrates o  Increasing activity/exercise o  Talking with your  doctor about reaching your blood sugar goals . High blood sugars (greater than 180 mg/dL) can raise your risk of infections and slow your recovery, so you will need to focus on controlling your diabetes during the weeks before surgery. . Make sure that the doctor who takes care of your diabetes knows about your planned surgery including the date and location.  How do I manage my blood sugar before surgery? . Check your blood sugar at least 4 times a day, starting 2 days before surgery, to make sure that the level is not too high or low. . Check your blood sugar the morning of your surgery when you wake up and every 2 hours until you get to the Short Stay unit. o If your blood sugar is less than 70 mg/dL, you will need to treat for low blood sugar: - Do not take insulin. - Treat a low blood sugar (less than 70 mg/dL) with  cup of clear juice (cranberry or apple), 4 glucose tablets, OR glucose gel. - Recheck blood sugar in 15 minutes after treatment (to make sure it is greater than 70 mg/dL). If your blood sugar is not greater than 70 mg/dL on recheck, call [phone] for further instructions. . Report your blood sugar to the short stay nurse when you get to Short Stay.  . If you are admitted to the hospital after surgery: o Your blood sugar will be checked by the staff and you will probably be given insulin after surgery (instead of oral diabetes medicines) to make sure you have good blood sugar levels. o The goal for  blood sugar control after surgery is 80-180 mg/dL.                      Do not wear jewelry, make up, or nail polish            Do not wear lotions, powders, perfumes/colognes, or deodorant.            Do not shave 48 hours prior to surgery.              Do not bring valuables to the hospital.            [facility] is not responsible for any belongings or valuables.  Do NOT Smoke (Tobacco/Vaping) or drink Alcohol 24 hours prior to your procedure If you use a CPAP at night,  you may bring all equipment for your overnight stay.   Contacts, glasses, dentures or bridgework may not be worn into surgery, please bring cases for these belongings   For patients admitted to the hospital, discharge time will be determined by your treatment team.   Patients discharged the day of surgery will not be allowed to drive home, and someone needs to stay with them for 24 hours.    Special instructions:   [place]- Preparing For Surgery  Before surgery, you can play an important role. Because skin is not sterile, your skin needs to be as free of germs as possible. You can reduce the number of germs on your skin by washing with CHG (chlorahexidine gluconate) Soap before surgery.  CHG is an antiseptic cleaner which kills germs and bonds with the skin to continue killing germs even after washing.    Oral Hygiene is also important to reduce your risk of infection.  Remember - BRUSH YOUR TEETH THE MORNING OF SURGERY WITH YOUR REGULAR TOOTHPASTE  Please do not use if you have an allergy to CHG or antibacterial soaps. If your skin becomes reddened/irritated stop using the CHG.  Do not shave (including legs and underarms) for at least 48 hours prior to first CHG shower. It is OK to shave your face.  Please follow these instructions carefully.   1. Shower the NIGHT BEFORE SURGERY and the MORNING OF SURGERY  2. If you chose to wash your hair, wash your hair first as usual with your normal shampoo.  3. After you shampoo, rinse your hair and body thoroughly to remove the shampoo.  4. Wash Face and genitals (private parts) with your normal soap.   5.  Shower the NIGHT BEFORE SURGERY and the MORNING OF SURGERY with CHG Soap.   6. Use CHG Soap as you would any other liquid soap. You can apply CHG directly to the skin and wash gently with a scrungie or a clean washcloth.   7. Apply the CHG Soap to your body ONLY FROM THE NECK DOWN.  Do not use on open wounds or open sores. Avoid  contact with your eyes, ears, mouth and genitals (private parts). Wash Face and genitals (private parts)  with your normal soap.   8. Wash thoroughly, paying special attention to the area where your surgery will be performed.  9. Thoroughly rinse your body with warm water from the neck down.  10. DO NOT shower/wash with your normal soap after using and rinsing off the CHG Soap.  11. Pat yourself dry with a CLEAN TOWEL.  12. Wear CLEAN PAJAMAS to bed the night before surgery  13. Place CLEAN SHEETS on your bed the  night before your surgery  14. DO NOT SLEEP WITH PETS.   Day of Surgery: Take a shower with CHG soap. Wear Clean/Comfortable clothing the morning of surgery Do not apply any deodorants/lotions.   Remember to brush your teeth WITH YOUR REGULAR TOOTHPASTE.   Please read over the following fact sheets that you were given.

## 2020-08-01 ENCOUNTER — Other Ambulatory Visit: Payer: Self-pay

## 2020-08-01 ENCOUNTER — Encounter (HOSPITAL_COMMUNITY): Payer: Self-pay

## 2020-08-01 ENCOUNTER — Ambulatory Visit (HOSPITAL_COMMUNITY)
Admission: RE | Admit: 2020-08-01 | Discharge: 2020-08-01 | Disposition: A | Discharge status: Home / Self Care | Payer: PPO | Source: Ambulatory Visit | Attending: Internal Medicine | Admitting: Internal Medicine

## 2020-08-01 ENCOUNTER — Encounter (HOSPITAL_COMMUNITY)
Admission: RE | Admit: 2020-08-01 | Discharge: 2020-08-01 | Disposition: A | Discharge status: Home / Self Care | Payer: PPO | Source: Ambulatory Visit | Attending: Neurosurgery | Admitting: Neurosurgery

## 2020-08-01 DIAGNOSIS — Z800 Family history of malignant neoplasm of digestive organs: Secondary | ICD-10-CM | POA: Diagnosis not present

## 2020-08-01 DIAGNOSIS — M5124 Other intervertebral disc displacement, thoracic region: Secondary | ICD-10-CM | POA: Insufficient documentation

## 2020-08-01 DIAGNOSIS — S2241XA Multiple fractures of ribs, right side, initial encounter for closed fracture: Secondary | ICD-10-CM | POA: Insufficient documentation

## 2020-08-01 DIAGNOSIS — M48062 Spinal stenosis, lumbar region with neurogenic claudication: Secondary | ICD-10-CM | POA: Diagnosis present

## 2020-08-01 DIAGNOSIS — Z4889 Encounter for other specified surgical aftercare: Secondary | ICD-10-CM | POA: Diagnosis not present

## 2020-08-01 DIAGNOSIS — M48061 Spinal stenosis, lumbar region without neurogenic claudication: Secondary | ICD-10-CM | POA: Diagnosis not present

## 2020-08-01 DIAGNOSIS — Z882 Allergy status to sulfonamides status: Secondary | ICD-10-CM | POA: Diagnosis not present

## 2020-08-01 DIAGNOSIS — E119 Type 2 diabetes mellitus without complications: Secondary | ICD-10-CM | POA: Diagnosis present

## 2020-08-01 DIAGNOSIS — R911 Solitary pulmonary nodule: Secondary | ICD-10-CM | POA: Insufficient documentation

## 2020-08-01 DIAGNOSIS — M4804 Spinal stenosis, thoracic region: Secondary | ICD-10-CM | POA: Insufficient documentation

## 2020-08-01 DIAGNOSIS — S2243XA Multiple fractures of ribs, bilateral, initial encounter for closed fracture: Secondary | ICD-10-CM | POA: Diagnosis not present

## 2020-08-01 DIAGNOSIS — J45909 Unspecified asthma, uncomplicated: Secondary | ICD-10-CM | POA: Diagnosis present

## 2020-08-01 DIAGNOSIS — M79621 Pain in right upper arm: Secondary | ICD-10-CM | POA: Insufficient documentation

## 2020-08-01 DIAGNOSIS — M5136 Other intervertebral disc degeneration, lumbar region: Secondary | ICD-10-CM | POA: Diagnosis present

## 2020-08-01 DIAGNOSIS — Z833 Family history of diabetes mellitus: Secondary | ICD-10-CM | POA: Diagnosis not present

## 2020-08-01 DIAGNOSIS — M797 Fibromyalgia: Secondary | ICD-10-CM | POA: Diagnosis present

## 2020-08-01 DIAGNOSIS — Z8249 Family history of ischemic heart disease and other diseases of the circulatory system: Secondary | ICD-10-CM | POA: Diagnosis not present

## 2020-08-01 DIAGNOSIS — E871 Hypo-osmolality and hyponatremia: Secondary | ICD-10-CM | POA: Diagnosis not present

## 2020-08-01 DIAGNOSIS — G8929 Other chronic pain: Secondary | ICD-10-CM | POA: Diagnosis present

## 2020-08-01 DIAGNOSIS — M810 Age-related osteoporosis without current pathological fracture: Secondary | ICD-10-CM | POA: Diagnosis present

## 2020-08-01 DIAGNOSIS — Z808 Family history of malignant neoplasm of other organs or systems: Secondary | ICD-10-CM | POA: Diagnosis not present

## 2020-08-01 DIAGNOSIS — K219 Gastro-esophageal reflux disease without esophagitis: Secondary | ICD-10-CM | POA: Diagnosis present

## 2020-08-01 DIAGNOSIS — Z981 Arthrodesis status: Secondary | ICD-10-CM | POA: Diagnosis not present

## 2020-08-01 DIAGNOSIS — Z885 Allergy status to narcotic agent status: Secondary | ICD-10-CM | POA: Diagnosis not present

## 2020-08-01 DIAGNOSIS — E7800 Pure hypercholesterolemia, unspecified: Secondary | ICD-10-CM | POA: Diagnosis not present

## 2020-08-01 DIAGNOSIS — L4050 Arthropathic psoriasis, unspecified: Secondary | ICD-10-CM | POA: Diagnosis present

## 2020-08-01 DIAGNOSIS — I2510 Atherosclerotic heart disease of native coronary artery without angina pectoris: Secondary | ICD-10-CM | POA: Diagnosis not present

## 2020-08-01 DIAGNOSIS — Z8261 Family history of arthritis: Secondary | ICD-10-CM | POA: Diagnosis not present

## 2020-08-01 DIAGNOSIS — E785 Hyperlipidemia, unspecified: Secondary | ICD-10-CM | POA: Diagnosis present

## 2020-08-01 DIAGNOSIS — M5116 Intervertebral disc disorders with radiculopathy, lumbar region: Secondary | ICD-10-CM | POA: Diagnosis present

## 2020-08-01 DIAGNOSIS — I10 Essential (primary) hypertension: Secondary | ICD-10-CM | POA: Diagnosis present

## 2020-08-01 DIAGNOSIS — I7300 Raynaud's syndrome without gangrene: Secondary | ICD-10-CM | POA: Diagnosis present

## 2020-08-01 DIAGNOSIS — I700 Atherosclerosis of aorta: Secondary | ICD-10-CM | POA: Insufficient documentation

## 2020-08-01 DIAGNOSIS — Z20822 Contact with and (suspected) exposure to covid-19: Secondary | ICD-10-CM | POA: Diagnosis present

## 2020-08-01 DIAGNOSIS — M5416 Radiculopathy, lumbar region: Secondary | ICD-10-CM | POA: Diagnosis present

## 2020-08-01 DIAGNOSIS — Z7983 Long term (current) use of bisphosphonates: Secondary | ICD-10-CM | POA: Diagnosis not present

## 2020-08-01 DIAGNOSIS — S2241XD Multiple fractures of ribs, right side, subsequent encounter for fracture with routine healing: Secondary | ICD-10-CM | POA: Diagnosis not present

## 2020-08-01 DIAGNOSIS — Z888 Allergy status to other drugs, medicaments and biological substances status: Secondary | ICD-10-CM | POA: Diagnosis not present

## 2020-08-01 DIAGNOSIS — M4326 Fusion of spine, lumbar region: Secondary | ICD-10-CM | POA: Diagnosis not present

## 2020-08-01 DIAGNOSIS — Z01818 Encounter for other preprocedural examination: Secondary | ICD-10-CM | POA: Insufficient documentation

## 2020-08-01 HISTORY — DX: Fibromyalgia: M79.7

## 2020-08-01 LAB — SARS CORONAVIRUS 2 (TAT 6-24 HRS): SARS Coronavirus 2: NEGATIVE

## 2020-08-01 LAB — CBC
HCT: 39.8 % (ref 36.0–46.0)
Hemoglobin: 12.9 g/dL (ref 12.0–15.0)
MCH: 31.3 pg (ref 26.0–34.0)
MCHC: 32.4 g/dL (ref 30.0–36.0)
MCV: 96.6 fL (ref 80.0–100.0)
Platelets: 276 10*3/uL (ref 150–400)
RBC: 4.12 MIL/uL (ref 3.87–5.11)
RDW: 14.4 % (ref 11.5–15.5)
WBC: 15.5 10*3/uL — ABNORMAL HIGH (ref 4.0–10.5)
nRBC: 0 % (ref 0.0–0.2)

## 2020-08-01 LAB — BASIC METABOLIC PANEL
Anion gap: 9 (ref 5–15)
BUN: 23 mg/dL (ref 8–23)
CO2: 23 mmol/L (ref 22–32)
Calcium: 9.6 mg/dL (ref 8.9–10.3)
Chloride: 105 mmol/L (ref 98–111)
Creatinine, Ser: 0.82 mg/dL (ref 0.44–1.00)
GFR, Estimated: >60 mL/min (ref >60)
Glucose, Bld: 162 mg/dL — ABNORMAL HIGH (ref 70–99)
Potassium: 4.4 mmol/L (ref 3.5–5.1)
Sodium: 137 mmol/L (ref 135–145)

## 2020-08-01 LAB — TYPE AND SCREEN
ABO/RH(D): AB POS
Antibody Screen: NEGATIVE

## 2020-08-01 LAB — SURGICAL PCR SCREEN
MRSA, PCR: NEGATIVE
Staphylococcus aureus: NEGATIVE

## 2020-08-01 LAB — HEMOGLOBIN A1C
Hgb A1c MFr Bld: 6.6 % — ABNORMAL HIGH (ref 4.8–5.6)
Mean Plasma Glucose: 142.72 mg/dL

## 2020-08-01 LAB — GLUCOSE, CAPILLARY: Glucose-Capillary: 168 mg/dL — ABNORMAL HIGH (ref 70–99)

## 2020-08-01 NOTE — Progress Notes (Addendum)
PCP - [name] Cardiologist - denies  PPM/ICD - denies   Chest x-ray - n/a EKG - 03/07/20 Stress Test - denies ECHO - denies Cardiac Cath - denies  Denies sleep apnea  Fasting Blood Sugar - 90-120 Checks Blood Sugar once every two weeks   Hold Apremilast (OTEZLA) and leflunomide (ARAVA) 1 week prior to surgery.  As of today, STOP taking any Aspirin (unless otherwise instructed by your surgeon) Aleve, Naproxen, Ibuprofen, Motrin, Advil, Goody's, BC's, all herbal medications, fish oil, and all vitamins.  ERAS Protcol -no  COVID TEST- 08/01/20   Anesthesia review: yes, wBC 15.5(message sent to office and office called0  Patient denies shortness of breath, fever, cough and chest pain at PAT appointment   All instructions explained to the patient, with a verbal understanding of the material. Patient agrees to go over the instructions while at home for a better understanding. Patient also instructed to self quarantine after being tested for COVID-19. The opportunity to ask questions was provided.

## 2020-08-01 NOTE — Progress Notes (Signed)
Notified Dr. Arnoldo Morale via staff message and phone call to office of WBC 15.5.

## 2020-08-02 NOTE — Anesthesia Preprocedure Evaluation (Addendum)
Anesthesia Evaluation  Patient identified by MRN, date of birth, ID band Patient awake    Reviewed: Allergy & Precautions, NPO status , Patient's Chart, lab work & pertinent test results  Airway Mallampati: II  TM Distance: >3 FB Neck ROM: Full    Dental  (+) Teeth Intact   Pulmonary asthma ,  COPD inhaler,    Pulmonary exam normal        Cardiovascular hypertension, Pt. on medications and Pt. on home beta blockers  Rhythm:Regular Rate:Normal     Neuro/Psych  Headaches, negative psych ROS   GI/Hepatic Neg liver ROS, GERD  Medicated,  Endo/Other  diabetes, Type 2, Insulin Dependent  Renal/GU negative Renal ROS  negative genitourinary   Musculoskeletal  (+) Arthritis , Osteoarthritis,  Fibromyalgia -Prior C-spine fusion ACDF 2019, 2021 here with lumbar stenosis   Abdominal (+)  Abdomen: soft. Bowel sounds: normal.  Peds  Hematology negative hematology ROS (+)   Anesthesia Other Findings   Reproductive/Obstetrics                            Anesthesia Physical Anesthesia Plan  ASA: II  Anesthesia Plan: General   Post-op Pain Management:    Induction: Intravenous  PONV Risk Score and Plan: 3 and Ondansetron, Midazolam and Treatment may vary due to age or medical condition  Airway Management Planned: Mask and Oral ETT  Additional Equipment: None  Intra-op Plan:   Post-operative Plan: Extubation in OR  Informed Consent: I have reviewed the patients History and Physical, chart, labs and discussed the procedure including the risks, benefits and alternatives for the proposed anesthesia with the patient or authorized representative who has indicated his/her understanding and acceptance.     Dental advisory given  Plan Discussed with: CRNA  Anesthesia Plan Comments: (Lab Results      Component                Value               Date                      WBC                       15.5 (H)            08/01/2020                HGB                      12.9                08/01/2020                HCT                      39.8                08/01/2020                MCV                      96.6                08/01/2020                PLT  276                 08/01/2020           Lab Results      Component                Value               Date                      NA                       137                 08/01/2020                K                        4.4                 08/01/2020                CO2                      23                  08/01/2020                GLUCOSE                  162 (H)             08/01/2020                BUN                      23                  08/01/2020                CREATININE               0.82                08/01/2020                CALCIUM                  9.6                 08/01/2020                GFRNONAA                 >60                 08/01/2020                GFRAA                    65                  06/08/2020          )       Anesthesia Quick Evaluation

## 2020-08-03 ENCOUNTER — Encounter (HOSPITAL_COMMUNITY): Payer: Self-pay | Admitting: Neurosurgery

## 2020-08-03 ENCOUNTER — Inpatient Hospital Stay (HOSPITAL_COMMUNITY): Payer: PPO

## 2020-08-03 ENCOUNTER — Inpatient Hospital Stay (HOSPITAL_COMMUNITY)
Admission: RE | Admit: 2020-08-03 | Discharge: 2020-08-04 | DRG: 455 | Disposition: A | Discharge status: Home / Self Care | Payer: PPO | Source: Ambulatory Visit | Attending: Neurosurgery | Admitting: Neurosurgery

## 2020-08-03 ENCOUNTER — Inpatient Hospital Stay (HOSPITAL_COMMUNITY): Payer: PPO | Admitting: Anesthesiology

## 2020-08-03 ENCOUNTER — Inpatient Hospital Stay (HOSPITAL_COMMUNITY): Payer: PPO | Admitting: Physician Assistant

## 2020-08-03 ENCOUNTER — Encounter (HOSPITAL_COMMUNITY)
Admission: RE | Disposition: A | Discharge status: Home / Self Care | Payer: Self-pay | Source: Ambulatory Visit | Attending: Neurosurgery

## 2020-08-03 ENCOUNTER — Other Ambulatory Visit: Payer: Self-pay

## 2020-08-03 DIAGNOSIS — Z981 Arthrodesis status: Secondary | ICD-10-CM | POA: Diagnosis not present

## 2020-08-03 DIAGNOSIS — J45909 Unspecified asthma, uncomplicated: Secondary | ICD-10-CM | POA: Diagnosis present

## 2020-08-03 DIAGNOSIS — Z8249 Family history of ischemic heart disease and other diseases of the circulatory system: Secondary | ICD-10-CM

## 2020-08-03 DIAGNOSIS — Z800 Family history of malignant neoplasm of digestive organs: Secondary | ICD-10-CM | POA: Diagnosis not present

## 2020-08-03 DIAGNOSIS — M797 Fibromyalgia: Secondary | ICD-10-CM | POA: Diagnosis present

## 2020-08-03 DIAGNOSIS — M5416 Radiculopathy, lumbar region: Secondary | ICD-10-CM | POA: Diagnosis present

## 2020-08-03 DIAGNOSIS — Z885 Allergy status to narcotic agent status: Secondary | ICD-10-CM

## 2020-08-03 DIAGNOSIS — Z808 Family history of malignant neoplasm of other organs or systems: Secondary | ICD-10-CM | POA: Diagnosis not present

## 2020-08-03 DIAGNOSIS — G8929 Other chronic pain: Secondary | ICD-10-CM | POA: Diagnosis present

## 2020-08-03 DIAGNOSIS — E119 Type 2 diabetes mellitus without complications: Secondary | ICD-10-CM | POA: Diagnosis present

## 2020-08-03 DIAGNOSIS — M48062 Spinal stenosis, lumbar region with neurogenic claudication: Principal | ICD-10-CM | POA: Diagnosis present

## 2020-08-03 DIAGNOSIS — Z882 Allergy status to sulfonamides status: Secondary | ICD-10-CM | POA: Diagnosis not present

## 2020-08-03 DIAGNOSIS — Z833 Family history of diabetes mellitus: Secondary | ICD-10-CM | POA: Diagnosis not present

## 2020-08-03 DIAGNOSIS — L4050 Arthropathic psoriasis, unspecified: Secondary | ICD-10-CM | POA: Diagnosis present

## 2020-08-03 DIAGNOSIS — E785 Hyperlipidemia, unspecified: Secondary | ICD-10-CM | POA: Diagnosis present

## 2020-08-03 DIAGNOSIS — Z8261 Family history of arthritis: Secondary | ICD-10-CM

## 2020-08-03 DIAGNOSIS — M5136 Other intervertebral disc degeneration, lumbar region: Secondary | ICD-10-CM | POA: Diagnosis present

## 2020-08-03 DIAGNOSIS — M4326 Fusion of spine, lumbar region: Secondary | ICD-10-CM | POA: Diagnosis not present

## 2020-08-03 DIAGNOSIS — I7300 Raynaud's syndrome without gangrene: Secondary | ICD-10-CM | POA: Diagnosis present

## 2020-08-03 DIAGNOSIS — Z888 Allergy status to other drugs, medicaments and biological substances status: Secondary | ICD-10-CM

## 2020-08-03 DIAGNOSIS — Z20822 Contact with and (suspected) exposure to covid-19: Secondary | ICD-10-CM | POA: Diagnosis present

## 2020-08-03 DIAGNOSIS — M5116 Intervertebral disc disorders with radiculopathy, lumbar region: Secondary | ICD-10-CM | POA: Diagnosis present

## 2020-08-03 DIAGNOSIS — M810 Age-related osteoporosis without current pathological fracture: Secondary | ICD-10-CM | POA: Diagnosis present

## 2020-08-03 DIAGNOSIS — Z7983 Long term (current) use of bisphosphonates: Secondary | ICD-10-CM | POA: Diagnosis not present

## 2020-08-03 DIAGNOSIS — I10 Essential (primary) hypertension: Secondary | ICD-10-CM | POA: Diagnosis present

## 2020-08-03 DIAGNOSIS — Z7952 Long term (current) use of systemic steroids: Secondary | ICD-10-CM

## 2020-08-03 DIAGNOSIS — K219 Gastro-esophageal reflux disease without esophagitis: Secondary | ICD-10-CM | POA: Diagnosis present

## 2020-08-03 DIAGNOSIS — Z794 Long term (current) use of insulin: Secondary | ICD-10-CM

## 2020-08-03 DIAGNOSIS — Z7982 Long term (current) use of aspirin: Secondary | ICD-10-CM

## 2020-08-03 DIAGNOSIS — Z419 Encounter for procedure for purposes other than remedying health state, unspecified: Secondary | ICD-10-CM

## 2020-08-03 DIAGNOSIS — Z79899 Other long term (current) drug therapy: Secondary | ICD-10-CM

## 2020-08-03 DIAGNOSIS — Z4889 Encounter for other specified surgical aftercare: Secondary | ICD-10-CM | POA: Diagnosis not present

## 2020-08-03 DIAGNOSIS — M48061 Spinal stenosis, lumbar region without neurogenic claudication: Secondary | ICD-10-CM | POA: Diagnosis present

## 2020-08-03 HISTORY — PX: LUMBAR FUSION: SHX111

## 2020-08-03 LAB — GLUCOSE, CAPILLARY
Glucose-Capillary: 143 mg/dL — ABNORMAL HIGH (ref 70–99)
Glucose-Capillary: 178 mg/dL — ABNORMAL HIGH (ref 70–99)
Glucose-Capillary: 231 mg/dL — ABNORMAL HIGH (ref 70–99)
Glucose-Capillary: 217 mg/dL — ABNORMAL HIGH (ref 70–99)

## 2020-08-03 SURGERY — POSTERIOR LUMBAR FUSION 1 LEVEL
Anesthesia: General

## 2020-08-03 MED ORDER — CYCLOBENZAPRINE HCL 10 MG PO TABS
ORAL_TABLET | ORAL | Status: AC
  Filled 2020-08-03: qty 1

## 2020-08-03 MED ORDER — BISACODYL 10 MG RE SUPP: 10.0000 mg | Freq: Every day | RECTAL | Status: DC | PRN

## 2020-08-03 MED ORDER — METOPROLOL SUCCINATE ER 50 MG PO TB24
50.0000 mg | ORAL_TABLET | Freq: Every day | ORAL | Status: DC
  Administered 2020-08-04: 50 mg via ORAL
  Filled 2020-08-03: qty 1

## 2020-08-03 MED ORDER — ACETAMINOPHEN 10 MG/ML IV SOLN
INTRAVENOUS | Status: AC
  Filled 2020-08-03: qty 100

## 2020-08-03 MED ORDER — ONDANSETRON HCL 4 MG/2ML IJ SOLN: 4.0000 mg | Freq: Four times a day (QID) | INTRAMUSCULAR | Status: DC | PRN

## 2020-08-03 MED ORDER — OXYCODONE HCL 5 MG PO TABS
5.0000 mg | ORAL_TABLET | ORAL | Status: DC | PRN
  Administered 2020-08-04: 5 mg via ORAL
  Filled 2020-08-03: qty 1

## 2020-08-03 MED ORDER — FENTANYL CITRATE (PF) 250 MCG/5ML IJ SOLN
INTRAMUSCULAR | Status: DC | PRN
  Administered 2020-08-03: 50 ug via INTRAVENOUS
  Administered 2020-08-03 (×2): 25 ug via INTRAVENOUS
  Administered 2020-08-03 (×2): 50 ug via INTRAVENOUS

## 2020-08-03 MED ORDER — ACETAMINOPHEN 325 MG PO TABS: 650.0000 mg | ORAL_TABLET | ORAL | Status: DC | PRN

## 2020-08-03 MED ORDER — HEMOSTATIC AGENTS (NO CHARGE) OPTIME
TOPICAL | Status: DC | PRN
  Administered 2020-08-03: 1 via TOPICAL

## 2020-08-03 MED ORDER — ACETAMINOPHEN 10 MG/ML IV SOLN
1000.0000 mg | Freq: Once | INTRAVENOUS | Status: DC | PRN
  Administered 2020-08-03: 1000 mg via INTRAVENOUS

## 2020-08-03 MED ORDER — INSULIN ASPART 100 UNIT/ML IJ SOLN
0.0000 [IU] | Freq: Three times a day (TID) | INTRAMUSCULAR | Status: DC
  Administered 2020-08-03: 7 [IU] via SUBCUTANEOUS

## 2020-08-03 MED ORDER — TOPIRAMATE 100 MG PO TABS
100.0000 mg | ORAL_TABLET | Freq: Every day | ORAL | Status: DC
  Administered 2020-08-03: 100 mg via ORAL
  Filled 2020-08-03: qty 1

## 2020-08-03 MED ORDER — PROPOFOL 10 MG/ML IV BOLUS
INTRAVENOUS | Status: DC | PRN
  Administered 2020-08-03: 120 mg via INTRAVENOUS

## 2020-08-03 MED ORDER — BUPIVACAINE-EPINEPHRINE (PF) 0.5% -1:200000 IJ SOLN
INTRAMUSCULAR | Status: DC | PRN
  Administered 2020-08-03: 10 mL

## 2020-08-03 MED ORDER — BUPIVACAINE-EPINEPHRINE 0.5% -1:200000 IJ SOLN
INTRAMUSCULAR | Status: AC
  Filled 2020-08-03: qty 1

## 2020-08-03 MED ORDER — LOSARTAN POTASSIUM 50 MG PO TABS
100.0000 mg | ORAL_TABLET | Freq: Every day | ORAL | Status: DC
  Administered 2020-08-04: 100 mg via ORAL
  Filled 2020-08-03: qty 2

## 2020-08-03 MED ORDER — DEXAMETHASONE SODIUM PHOSPHATE 10 MG/ML IJ SOLN
INTRAMUSCULAR | Status: DC | PRN
  Administered 2020-08-03: 10 mg via INTRAVENOUS

## 2020-08-03 MED ORDER — ACETAMINOPHEN 650 MG RE SUPP: 650.0000 mg | RECTAL | Status: DC | PRN

## 2020-08-03 MED ORDER — HYDROCORTISONE NA SUCCINATE PF 100 MG IJ SOLR: 50.0000 mg | Freq: Three times a day (TID) | INTRAMUSCULAR | Status: DC

## 2020-08-03 MED ORDER — ALBUTEROL SULFATE HFA 108 (90 BASE) MCG/ACT IN AERS
2.0000 | INHALATION_SPRAY | Freq: Four times a day (QID) | RESPIRATORY_TRACT | Status: DC | PRN
  Filled 2020-08-03: qty 6.7

## 2020-08-03 MED ORDER — BACITRACIN ZINC 500 UNIT/GM EX OINT
TOPICAL_OINTMENT | CUTANEOUS | Status: AC
  Filled 2020-08-03: qty 28.35

## 2020-08-03 MED ORDER — CYCLOBENZAPRINE HCL 10 MG PO TABS
10.0000 mg | ORAL_TABLET | Freq: Three times a day (TID) | ORAL | Status: DC | PRN
  Administered 2020-08-03 – 2020-08-04 (×3): 10 mg via ORAL
  Filled 2020-08-03 (×2): qty 1

## 2020-08-03 MED ORDER — PREDNISONE 5 MG PO TABS
5.0000 mg | ORAL_TABLET | Freq: Every day | ORAL | Status: DC
  Administered 2020-08-03 – 2020-08-04 (×2): 5 mg via ORAL
  Filled 2020-08-03 (×2): qty 1

## 2020-08-03 MED ORDER — PROMETHAZINE HCL 25 MG/ML IJ SOLN: 6.2500 mg | INTRAMUSCULAR | Status: DC | PRN

## 2020-08-03 MED ORDER — SODIUM CHLORIDE 0.9 % IV SOLN
250.0000 mL | INTRAVENOUS | Status: DC
  Administered 2020-08-03: 250 mL via INTRAVENOUS

## 2020-08-03 MED ORDER — SPIRONOLACTONE 25 MG PO TABS
25.0000 mg | ORAL_TABLET | Freq: Every day | ORAL | Status: DC
  Administered 2020-08-03 – 2020-08-04 (×2): 25 mg via ORAL
  Filled 2020-08-03 (×2): qty 1

## 2020-08-03 MED ORDER — POTASSIUM CHLORIDE CRYS ER 20 MEQ PO TBCR
20.0000 meq | EXTENDED_RELEASE_TABLET | Freq: Every day | ORAL | Status: DC
  Administered 2020-08-04: 20 meq via ORAL
  Filled 2020-08-03: qty 1

## 2020-08-03 MED ORDER — FENTANYL CITRATE (PF) 250 MCG/5ML IJ SOLN
INTRAMUSCULAR | Status: AC
  Filled 2020-08-03: qty 5

## 2020-08-03 MED ORDER — PHENYLEPHRINE 40 MCG/ML (10ML) SYRINGE FOR IV PUSH (FOR BLOOD PRESSURE SUPPORT)
PREFILLED_SYRINGE | INTRAVENOUS | Status: DC | PRN
  Administered 2020-08-03 (×2): 80 ug via INTRAVENOUS

## 2020-08-03 MED ORDER — EPHEDRINE SULFATE-NACL 50-0.9 MG/10ML-% IV SOSY
PREFILLED_SYRINGE | INTRAVENOUS | Status: DC | PRN
  Administered 2020-08-03 (×2): 10 mg via INTRAVENOUS

## 2020-08-03 MED ORDER — CHLORHEXIDINE GLUCONATE CLOTH 2 % EX PADS: 6.0000 | MEDICATED_PAD | Freq: Once | CUTANEOUS | Status: DC

## 2020-08-03 MED ORDER — IPRATROPIUM BROMIDE 0.06 % NA SOLN: 1.0000 | Freq: Three times a day (TID) | NASAL | Status: DC

## 2020-08-03 MED ORDER — AZELASTINE HCL 0.1 % NA SOLN
1.0000 | Freq: Two times a day (BID) | NASAL | Status: DC
  Administered 2020-08-03 – 2020-08-04 (×2): 1 via NASAL
  Filled 2020-08-03: qty 30

## 2020-08-03 MED ORDER — PANTOPRAZOLE SODIUM 40 MG PO TBEC
40.0000 mg | DELAYED_RELEASE_TABLET | Freq: Every day | ORAL | Status: DC
  Administered 2020-08-03 – 2020-08-04 (×2): 40 mg via ORAL
  Filled 2020-08-03 (×2): qty 1

## 2020-08-03 MED ORDER — OXYCODONE HCL 5 MG PO TABS
10.0000 mg | ORAL_TABLET | ORAL | Status: DC | PRN
  Administered 2020-08-03 – 2020-08-04 (×4): 10 mg via ORAL
  Filled 2020-08-03 (×4): qty 2

## 2020-08-03 MED ORDER — ONDANSETRON HCL 4 MG/2ML IJ SOLN
INTRAMUSCULAR | Status: DC | PRN
  Administered 2020-08-03: 4 mg via INTRAVENOUS

## 2020-08-03 MED ORDER — FENTANYL CITRATE (PF) 100 MCG/2ML IJ SOLN
INTRAMUSCULAR | Status: AC
  Filled 2020-08-03: qty 2

## 2020-08-03 MED ORDER — DEXAMETHASONE SODIUM PHOSPHATE 10 MG/ML IJ SOLN
INTRAMUSCULAR | Status: AC
  Filled 2020-08-03: qty 1

## 2020-08-03 MED ORDER — CHLORHEXIDINE GLUCONATE 0.12 % MT SOLN
OROMUCOSAL | Status: AC
  Administered 2020-08-03: 15 mL via OROMUCOSAL
  Filled 2020-08-03: qty 15

## 2020-08-03 MED ORDER — ONDANSETRON HCL 4 MG/2ML IJ SOLN
INTRAMUSCULAR | Status: AC
  Filled 2020-08-03: qty 2

## 2020-08-03 MED ORDER — CHLORHEXIDINE GLUCONATE 0.12 % MT SOLN
15.0000 mL | Freq: Two times a day (BID) | OROMUCOSAL | Status: DC
  Administered 2020-08-03 – 2020-08-04 (×2): 15 mL via OROMUCOSAL
  Filled 2020-08-03 (×3): qty 15

## 2020-08-03 MED ORDER — CEFAZOLIN SODIUM-DEXTROSE 2-4 GM/100ML-% IV SOLN
2.0000 g | Freq: Three times a day (TID) | INTRAVENOUS | Status: AC
  Administered 2020-08-03 – 2020-08-04 (×2): 2 g via INTRAVENOUS
  Filled 2020-08-03 (×2): qty 100

## 2020-08-03 MED ORDER — GABAPENTIN 300 MG PO CAPS
300.0000 mg | ORAL_CAPSULE | Freq: Three times a day (TID) | ORAL | Status: DC
  Administered 2020-08-03 – 2020-08-04 (×3): 300 mg via ORAL
  Filled 2020-08-03 (×3): qty 1

## 2020-08-03 MED ORDER — SUGAMMADEX SODIUM 200 MG/2ML IV SOLN
INTRAVENOUS | Status: DC | PRN
  Administered 2020-08-03: 200 mg via INTRAVENOUS

## 2020-08-03 MED ORDER — VANCOMYCIN HCL IN DEXTROSE 1-5 GM/200ML-% IV SOLN: 1000.0000 mg | INTRAVENOUS | Status: AC

## 2020-08-03 MED ORDER — DENOSUMAB 60 MG/ML ~~LOC~~ SOSY: 60.0000 mg | PREFILLED_SYRINGE | SUBCUTANEOUS | Status: DC

## 2020-08-03 MED ORDER — PHENYLEPHRINE HCL-NACL 10-0.9 MG/250ML-% IV SOLN
INTRAVENOUS | Status: DC | PRN
  Administered 2020-08-03: 20 ug/min via INTRAVENOUS

## 2020-08-03 MED ORDER — LACTATED RINGERS IV SOLN: INTRAVENOUS | Status: DC

## 2020-08-03 MED ORDER — ACETAMINOPHEN 500 MG PO TABS
1000.0000 mg | ORAL_TABLET | Freq: Four times a day (QID) | ORAL | Status: DC
  Administered 2020-08-03 – 2020-08-04 (×3): 1000 mg via ORAL
  Filled 2020-08-03 (×3): qty 2

## 2020-08-03 MED ORDER — LIDOCAINE 2% (20 MG/ML) 5 ML SYRINGE
INTRAMUSCULAR | Status: AC
  Filled 2020-08-03: qty 5

## 2020-08-03 MED ORDER — ROCURONIUM BROMIDE 10 MG/ML (PF) SYRINGE
PREFILLED_SYRINGE | INTRAVENOUS | Status: AC
  Filled 2020-08-03: qty 10

## 2020-08-03 MED ORDER — NORTRIPTYLINE HCL 25 MG PO CAPS
25.0000 mg | ORAL_CAPSULE | Freq: Every day | ORAL | Status: DC
  Administered 2020-08-03: 25 mg via ORAL
  Filled 2020-08-03 (×2): qty 1

## 2020-08-03 MED ORDER — LIDOCAINE 2% (20 MG/ML) 5 ML SYRINGE
INTRAMUSCULAR | Status: DC | PRN
  Administered 2020-08-03: 60 mg via INTRAVENOUS

## 2020-08-03 MED ORDER — VANCOMYCIN HCL IN DEXTROSE 1-5 GM/200ML-% IV SOLN
INTRAVENOUS | Status: AC
  Administered 2020-08-03: 1000 mg via INTRAVENOUS
  Filled 2020-08-03: qty 200

## 2020-08-03 MED ORDER — HYDROMORPHONE HCL 1 MG/ML IJ SOLN
0.5000 mg | INTRAMUSCULAR | Status: DC | PRN
  Administered 2020-08-03: 1 mg via INTRAVENOUS
  Filled 2020-08-03: qty 1

## 2020-08-03 MED ORDER — INSULIN ASPART 100 UNIT/ML IJ SOLN
0.0000 [IU] | Freq: Every day | INTRAMUSCULAR | Status: DC
  Administered 2020-08-03: 2 [IU] via SUBCUTANEOUS

## 2020-08-03 MED ORDER — SODIUM CHLORIDE 0.9% FLUSH: 3.0000 mL | INTRAVENOUS | Status: DC | PRN

## 2020-08-03 MED ORDER — BUPIVACAINE LIPOSOME 1.3 % IJ SUSP
INTRAMUSCULAR | Status: AC
  Filled 2020-08-03: qty 20

## 2020-08-03 MED ORDER — BACITRACIN ZINC 500 UNIT/GM EX OINT
TOPICAL_OINTMENT | CUTANEOUS | Status: DC | PRN
  Administered 2020-08-03: 1 via TOPICAL

## 2020-08-03 MED ORDER — DOCUSATE SODIUM 100 MG PO CAPS
100.0000 mg | ORAL_CAPSULE | Freq: Two times a day (BID) | ORAL | Status: DC
  Administered 2020-08-03 – 2020-08-04 (×2): 100 mg via ORAL
  Filled 2020-08-03 (×2): qty 1

## 2020-08-03 MED ORDER — 0.9 % SODIUM CHLORIDE (POUR BTL) OPTIME
TOPICAL | Status: DC | PRN
  Administered 2020-08-03: 1000 mL

## 2020-08-03 MED ORDER — MENTHOL 3 MG MT LOZG: 1.0000 | LOZENGE | OROMUCOSAL | Status: DC | PRN

## 2020-08-03 MED ORDER — SODIUM CHLORIDE 0.9% FLUSH: 3.0000 mL | Freq: Two times a day (BID) | INTRAVENOUS | Status: DC

## 2020-08-03 MED ORDER — ORAL CARE MOUTH RINSE: 15.0000 mL | Freq: Once | OROMUCOSAL | Status: AC

## 2020-08-03 MED ORDER — THROMBIN 5000 UNITS EX SOLR
CUTANEOUS | Status: AC
  Filled 2020-08-03: qty 5000

## 2020-08-03 MED ORDER — ROCURONIUM BROMIDE 10 MG/ML (PF) SYRINGE
PREFILLED_SYRINGE | INTRAVENOUS | Status: DC | PRN
  Administered 2020-08-03: 10 mg via INTRAVENOUS
  Administered 2020-08-03 (×2): 20 mg via INTRAVENOUS
  Administered 2020-08-03: 60 mg via INTRAVENOUS
  Administered 2020-08-03: 20 mg via INTRAVENOUS

## 2020-08-03 MED ORDER — PHENOL 1.4 % MT LIQD: 1.0000 | OROMUCOSAL | Status: DC | PRN

## 2020-08-03 MED ORDER — THROMBIN 5000 UNITS EX SOLR
OROMUCOSAL | Status: DC | PRN
  Administered 2020-08-03: 5 mL via TOPICAL

## 2020-08-03 MED ORDER — INSULIN ASPART 100 UNIT/ML IJ SOLN
0.0000 [IU] | Freq: Three times a day (TID) | INTRAMUSCULAR | Status: DC
  Administered 2020-08-04: 3 [IU] via SUBCUTANEOUS

## 2020-08-03 MED ORDER — LEFLUNOMIDE 20 MG PO TABS
20.0000 mg | ORAL_TABLET | Freq: Every day | ORAL | Status: DC
  Filled 2020-08-03: qty 1

## 2020-08-03 MED ORDER — HYDROCODONE-ACETAMINOPHEN 5-325 MG PO TABS: 1.0000 | ORAL_TABLET | ORAL | Status: DC | PRN

## 2020-08-03 MED ORDER — ZOLPIDEM TARTRATE 5 MG PO TABS: 5.0000 mg | ORAL_TABLET | Freq: Every evening | ORAL | Status: DC | PRN

## 2020-08-03 MED ORDER — FENTANYL CITRATE (PF) 100 MCG/2ML IJ SOLN
25.0000 ug | INTRAMUSCULAR | Status: DC | PRN
  Administered 2020-08-03: 50 ug via INTRAVENOUS
  Administered 2020-08-03: 25 ug via INTRAVENOUS
  Administered 2020-08-03: 50 ug via INTRAVENOUS

## 2020-08-03 MED ORDER — LACTATED RINGERS IV SOLN: INTRAVENOUS | Status: DC | PRN

## 2020-08-03 MED ORDER — ONDANSETRON HCL 4 MG PO TABS: 4.0000 mg | ORAL_TABLET | Freq: Four times a day (QID) | ORAL | Status: DC | PRN

## 2020-08-03 MED ORDER — CHLORHEXIDINE GLUCONATE 0.12 % MT SOLN: 15.0000 mL | Freq: Once | OROMUCOSAL | Status: AC

## 2020-08-03 MED ORDER — PROPOFOL 10 MG/ML IV BOLUS
INTRAVENOUS | Status: AC
  Filled 2020-08-03: qty 20

## 2020-08-03 SURGICAL SUPPLY — 66 items
BENZOIN TINCTURE PRP APPL 2/3 (GAUZE/BANDAGES/DRESSINGS) ×2 IMPLANT
BLADE CLIPPER SURG (BLADE) IMPLANT
BUR MATCHSTICK NEURO 3.0 LAGG (BURR) ×2 IMPLANT
BUR PRECISION FLUTE 6.0 (BURR) ×2 IMPLANT
CAGE ALTERA 10X31X9-13 15D (Cage) ×2 IMPLANT
CANISTER SUCT 3000ML PPV (MISCELLANEOUS) ×2 IMPLANT
CAP LOCKING REVERE (Cap) ×8 IMPLANT
CARTRIDGE OIL MAESTRO DRILL (MISCELLANEOUS) ×1 IMPLANT
CNTNR URN SCR LID CUP LEK RST (MISCELLANEOUS) ×1 IMPLANT
CONT SPEC 4OZ STRL OR WHT (MISCELLANEOUS) ×2
COVER BACK TABLE 60X90IN (DRAPES) ×2 IMPLANT
COVER WAND RF STERILE (DRAPES) ×2 IMPLANT
DECANTER SPIKE VIAL GLASS SM (MISCELLANEOUS) ×2 IMPLANT
DIFFUSER DRILL AIR PNEUMATIC (MISCELLANEOUS) ×2 IMPLANT
DRAPE C-ARM 42X72 X-RAY (DRAPES) ×8 IMPLANT
DRAPE HALF SHEET 40X57 (DRAPES) ×6 IMPLANT
DRAPE LAPAROTOMY 100X72X124 (DRAPES) ×2 IMPLANT
DRAPE SURG 17X23 STRL (DRAPES) ×8 IMPLANT
DRSG OPSITE POSTOP 4X6 (GAUZE/BANDAGES/DRESSINGS) ×2 IMPLANT
ELECT BLADE 4.0 EZ CLEAN MEGAD (MISCELLANEOUS) ×2
ELECT CAUTERY BLADE 6.4 (BLADE) ×2 IMPLANT
ELECT REM PT RETURN 9FT ADLT (ELECTROSURGICAL) ×2
ELECTRODE BLDE 4.0 EZ CLN MEGD (MISCELLANEOUS) ×1 IMPLANT
ELECTRODE REM PT RTRN 9FT ADLT (ELECTROSURGICAL) ×1 IMPLANT
EVACUATOR 1/8 PVC DRAIN (DRAIN) IMPLANT
GAUZE 4X4 16PLY RFD (DISPOSABLE) ×2 IMPLANT
GLOVE BIO SURGEON STRL SZ8 (GLOVE) ×4 IMPLANT
GLOVE BIO SURGEON STRL SZ8.5 (GLOVE) ×4 IMPLANT
GLOVE EXAM NITRILE XL STR (GLOVE) IMPLANT
GOWN STRL REUS W/ TWL LRG LVL3 (GOWN DISPOSABLE) IMPLANT
GOWN STRL REUS W/ TWL XL LVL3 (GOWN DISPOSABLE) ×2 IMPLANT
GOWN STRL REUS W/TWL 2XL LVL3 (GOWN DISPOSABLE) IMPLANT
GOWN STRL REUS W/TWL LRG LVL3 (GOWN DISPOSABLE)
GOWN STRL REUS W/TWL XL LVL3 (GOWN DISPOSABLE) ×4
HEMOSTAT POWDER KIT SURGIFOAM (HEMOSTASIS) ×2 IMPLANT
KIT BASIN OR (CUSTOM PROCEDURE TRAY) ×2 IMPLANT
KIT INFUSE X SMALL 1.4CC (Orthopedic Implant) ×2 IMPLANT
KIT TURNOVER KIT B (KITS) ×2 IMPLANT
MILL MEDIUM DISP (BLADE) ×2 IMPLANT
NEEDLE HYPO 21X1.5 SAFETY (NEEDLE) IMPLANT
NEEDLE HYPO 22GX1.5 SAFETY (NEEDLE) ×2 IMPLANT
NS IRRIG 1000ML POUR BTL (IV SOLUTION) ×2 IMPLANT
OIL CARTRIDGE MAESTRO DRILL (MISCELLANEOUS) ×2
PACK LAMINECTOMY NEURO (CUSTOM PROCEDURE TRAY) ×2 IMPLANT
PAD ARMBOARD 7.5X6 YLW CONV (MISCELLANEOUS) ×6 IMPLANT
PATTIES SURGICAL .5 X1 (DISPOSABLE) IMPLANT
PENCIL BUTTON HOLSTER BLD 10FT (ELECTRODE) ×2 IMPLANT
PUTTY DBM 10CC CALC GRAN (Putty) ×2 IMPLANT
ROD CURVED 5.5MMX35MM (Rod) ×4 IMPLANT
SCREW PEDICLE 6.5MMX35MM (Screw) ×4 IMPLANT
SCREW PEDICLE 7.5MMX45MM (Screw) ×4 IMPLANT
SEALANT ADHERUS EXTEND TIP (MISCELLANEOUS) ×2 IMPLANT
SPONGE LAP 4X18 RFD (DISPOSABLE) IMPLANT
SPONGE NEURO XRAY DETECT 1X3 (DISPOSABLE) IMPLANT
SPONGE SURGIFOAM ABS GEL 100 (HEMOSTASIS) ×2 IMPLANT
STRIP CLOSURE SKIN 1/2X4 (GAUZE/BANDAGES/DRESSINGS) ×2 IMPLANT
SUT ETHILON 2 0 PSLX (SUTURE) ×2 IMPLANT
SUT PROLENE 6 0 BV (SUTURE) ×4 IMPLANT
SUT VIC AB 1 CT1 18XBRD ANBCTR (SUTURE) ×2 IMPLANT
SUT VIC AB 1 CT1 8-18 (SUTURE) ×4
SUT VIC AB 2-0 CP2 18 (SUTURE) ×4 IMPLANT
SYR 20ML LL LF (SYRINGE) IMPLANT
TOWEL GREEN STERILE (TOWEL DISPOSABLE) ×2 IMPLANT
TOWEL GREEN STERILE FF (TOWEL DISPOSABLE) ×2 IMPLANT
TRAY FOLEY MTR SLVR 16FR STAT (SET/KITS/TRAYS/PACK) ×2 IMPLANT
WATER STERILE IRR 1000ML POUR (IV SOLUTION) ×2 IMPLANT

## 2020-08-03 NOTE — Progress Notes (Signed)
Orthopedic Tech Progress Note Patient Details:  Tonya Hall 1950-10-23 [id number] PACU RN called requesting a LSO BRACE. Dropped off to bedside Ortho Devices Type of Ortho Device: Lumbar corsett Ortho Device/Splint Location: BACK Ortho Device/Splint Interventions: Ordered   Post Interventions Patient Tolerated: Well Instructions Provided: [facility] of [place] 08/03/2020, 2:10 PM

## 2020-08-03 NOTE — Anesthesia Postprocedure Evaluation (Signed)
Anesthesia Post Note  Patient: Franki Monte  Procedure(s) Performed: Lumbar Four-Five Posterior lumbar interbody fusion with posterior instrumentation, exploration of fusion (N/A )     Patient location during evaluation: PACU Anesthesia Type: General Level of consciousness: awake and alert Pain management: pain level controlled Vital Signs Assessment: post-procedure vital signs reviewed and stable Respiratory status: spontaneous breathing, nonlabored ventilation, respiratory function stable and patient connected to nasal cannula oxygen Cardiovascular status: blood pressure returned to baseline and stable Postop Assessment: no apparent nausea or vomiting Anesthetic complications: no   No complications documented.  Last Vitals:  Vitals:   08/03/20 1430 08/03/20 1450  BP: (!) 124/52   Pulse: 87   Resp: 15   Temp: 36.9 C 37.1 C  SpO2: 100%     Last Pain:  Vitals:   08/03/20 1430  TempSrc:   PainSc: 4     LLE Motor Response: Purposeful movement (08/03/20 1517) LLE Sensation: Full sensation (08/03/20 1517) RLE Motor Response: Purposeful movement (08/03/20 1517) RLE Sensation: Full sensation (08/03/20 1517)      Belenda Cruise P Isaac Dubie

## 2020-08-03 NOTE — Transfer of Care (Signed)
Immediate Anesthesia Transfer of Care Note  Patient: Tonya Hall  Procedure(s) Performed: Lumbar Four-Five Posterior lumbar interbody fusion with posterior instrumentation, exploration of fusion (N/A )  Patient Location: PACU  Anesthesia Type:General  Level of Consciousness: drowsy and patient cooperative  Airway & Oxygen Therapy: Patient Spontanous Breathing and Patient connected to face mask oxygen  Post-op Assessment: Report given to RN and Post -op Vital signs reviewed and stable  Post vital signs: Reviewed and stable  Last Vitals:  Vitals Value Taken Time  BP 93/81 08/03/20 1329  Temp    Pulse 87 08/03/20 1331  Resp 23 08/03/20 1331  SpO2 96 % 08/03/20 1331  Vitals shown include unvalidated device data.  Last Pain:  Vitals:   08/03/20 0700  TempSrc: Oral         Complications: No complications documented.

## 2020-08-03 NOTE — Anesthesia Procedure Notes (Signed)
Procedure Name: Intubation Date/Time: 08/03/2020 8:45 AM Performed by: Thelma Comp, CRNA Pre-anesthesia Checklist: Patient identified, Emergency Drugs available, Suction available and Patient being monitored Patient Re-evaluated:Patient Re-evaluated prior to induction Oxygen Delivery Method: Circle System Utilized Preoxygenation: Pre-oxygenation with 100% oxygen Induction Type: IV induction Ventilation: Mask ventilation without difficulty Laryngoscope Size: Mac and 3 Grade View: Grade I Tube type: Oral Tube size: 7.0 mm Number of attempts: 1 Airway Equipment and Method: Stylet Placement Confirmation: ETT inserted through vocal cords under direct vision,  positive ETCO2 and breath sounds checked- equal and bilateral Secured at: 21 cm Tube secured with: Tape Dental Injury: Teeth and Oropharynx as per pre-operative assessment

## 2020-08-03 NOTE — Op Note (Signed)
Brief history: The is a 70 year old white female on whom another physician had performed lumbar fusions.  She has developed recurrent back and left great and right leg pain consistent with lumbar radiculopathy.  She has failed medical management and was worked up with a lumbar MRI and lumbar x-rays which demonstrated degenerative disc disease, foraminal stenosis, etc. at L4-5. Various treatment options with her.  She has decided proceed with surgery after weighing the risk, benefits and alternatives.  Preoperative diagnosis: L4-5 degenerative disc disease, spinal stenosis compressing both the L4 and the L5 nerve roots; lumbago; lumbar radiculopathy; neurogenic claudication  Postoperative diagnosis: The same  Procedure: Redo bilateral L4-5 laminotomy/foraminotomies/medial facetectomy to decompress the bilateral L4 and L5 nerve roots(the work required to do this was in addition to the work required to do the posterior lumbar interbody fusion because of the patient's spinal stenosis, facet arthropathy. Etc. requiring a wide decompression of the nerve roots.);  L4-5 transforaminal lumbar interbody fusion with bone morphogenic protein soaked collagen sponges, local morselized autograft bone and Zimmer DBM; insertion of interbody prosthesis at L4-5 (globus peek expandable interbody prosthesis); posterior nonsegmental instrumentation from L4 to L5 with globus titanium pedicle screws and rods; posterior lateral arthrodesis at L4-5 with bone morphogenic protein soaked collagen sponges, local morselized autograft bone and Zimmer DBM; exploration of lumbar fusion/removal of old lumbar hardware.  Surgeon: Dr. Earle Gell  Asst.: Arnetha Massy, NP  Anesthesia: Gen. Endotracheal   Estimated blood loss: 200 cc  Drains: None  Complications: None  Description of procedure: The patient was brought to the operating room by the anesthesia team. General endotracheal anesthesia was induced. The patient was turned to  the prone position on the Wilson frame. The patient's lumbosacral region was then prepared with Betadine scrub and Betadine solution. Sterile drapes were applied.  I then injected the area to be incised with Marcaine with epinephrine solution. I then used the scalpel to make a linear midline incision over the L4-5 interspace, incising through the old surgical scar. I then used electrocautery to perform a bilateral subperiosteal dissection exposing the spinous process and lamina of L4 and L5. We then obtained intraoperative radiograph to confirm our location. We then inserted the Verstrac retractor to provide exposure.  We explored the fusion by exposing the old screws at L4.  She appeared to have a good arthrodesis at L3-4.  We remove the old screws at L4  I began the decompression by using the high speed drill to perform laminotomies at L4-5 bilaterally. We then used the Kerrison punches to widen the laminotomy and removed the epidural scar tissue and the remainder of the ligamentum flavum at L4-5 bilaterally. We used the Kerrison punches to remove the medial facets at L4-5 bilaterally. We performed wide foraminotomies about the bilateral L4 and L5 nerve roots completing the decompression.  We now turned our attention to the posterior lumbar interbody fusion. I used a scalpel to incise the intervertebral disc at L5 bilaterally. I then performed a partial intervertebral discectomy at L4-5 bilaterally using the pituitary forceps. We prepared the vertebral endplates at G4-0 bilaterally for the fusion by removing the soft tissues with the curettes. We then used the trial spacers to pick the appropriate sized interbody prosthesis. We prefilled his prosthesis with a combination of local morselized autograft bone that we obtained during the decompression as well as Zimmer DBM. We inserted the prefilled prosthesis into the interspace at L4-5 from the left, we then turned and expanded the prosthesis. There was a  good snug fit of the prosthesis in the interspace. We then filled and the remainder of the intervertebral disc space with bone morphogenic protein soaked collagen sponges, local morselized autograft bone and Zimmer DBM. This completed the posterior lumbar interbody arthrodesis.  During the decompression and insertion of the prosthesis the assistant protected the thecal sac and nerve roots with the D'Errico retractor.  We now turned attention to the instrumentation. Under fluoroscopic guidance we cannulated the bilateral L5 pedicles with the bone probe. We then removed the bone probe. We then tapped the pedicle with a 6.5 millimeter tap. We then removed the tap. We probed inside the tapped pedicle with a ball probe to rule out cortical breaches. We then inserted a 7.5 x 45 millimeter pedicle screw into the L5 pedicles bilaterally under fluoroscopic guidance.  We used the old screw holes at L4 and replaced the old screws with the same size screws, i.e. 6.5 x 35 mm screws.  We then palpated along the medial aspect of the pedicles to rule out cortical breaches. There were none. The nerve roots were not injured. We then connected the unilateral pedicle screws with a lordotic rod. We compressed the construct and secured the rod in place with the caps. We then tightened the caps appropriately. This completed the instrumentation from L4 and L5 bilaterally.  We now turned our attention to the posterior lateral arthrodesis at L4-5. We used the high-speed drill to decorticate the remainder of the facets, pars, transverse process at L4-5 there is. We then applied a combination of bone morphogenic protein soaked collagen sponges, local morselized autograft bone and Zimmer DBM over these decorticated posterior lateral structures. This completed the posterior lateral arthrodesis.  We then obtained hemostasis using bipolar electrocautery. We irrigated the wound out with bacitracin solution. We inspected the thecal sac and  nerve roots and noted they were well decompressed. We then removed the retractor.  We injected Exparel . We reapproximated patient's thoracolumbar fascia with interrupted #1 Vicryl suture. We reapproximated patient's subcutaneous tissue with interrupted 2-0 Vicryl suture. The reapproximated patient's skin with Steri-Strips and benzoin. The wound was then coated with bacitracin ointment. A sterile dressing was applied. The drapes were removed. The patient was subsequently returned to the supine position where they were extubated by the anesthesia team. He was then transported to the post anesthesia care unit in stable condition. All sponge instrument and needle counts were reportedly correct at the end of this case.

## 2020-08-03 NOTE — H&P (Signed)
Subjective: The patient is a 70 year old white female who has had a previous lumbar fusion by another physician.  She has complained of back and bilateral leg pain.  She has failed medical management.  She was worked up with a lumbar MRI and lumbar x-rays which demonstrated stenosis at L4-5.  I discussed the various treatment options with her.  She has decided proceed with surgery.  Past Medical History:  Diagnosis Date  . Asthma    Albuterol in haler prn  . Cataract    immature unsure which eye  . Chronic back pain   . COVID-19 October/November 2020  . Degenerative disk disease    psoriatic  . Diabetes mellitus without complication (HCC)    diet controlled  . Esophageal motility disorder    Non-specific, see modified barium study/speech path, BP  . Fibromyalgia   . GERD (gastroesophageal reflux disease)    takes Protonix bid  . Headache 2019  . History of blood transfusion    post c-section  . History of bronchitis   . HTN (hypertension)   . Hyperlipidemia    takes Pravastatin daily  . Lymphocytic colitis 05/26/2010   Responded to Entocort x 3 MOS  . Neuropathy   . Nonallergic rhinitis   . Osteoporosis    gets Boniva every 3 months  . Peripheral edema    takes HCTZ daily  . Pneumonia 02/2019  . Psoriatic arthritis ([place])    Dr. Katherina Right  . Psoriatic arthritis ([place])    bilateral hands  . Raynaud's disease   . Seasonal allergies   . Shingles 2020  . SUI (stress urinary incontinence, female)   . Urinary urgency     Past Surgical History:  Procedure Laterality Date  . ANTERIOR CERVICAL DECOMP/DISCECTOMY FUSION N/A 03/09/2020   Procedure: ANTERIOR CERVICAL DECOMPRESSION/DISCECTOMY FUSION, INTERBODY PROSTHESIS, PLATE SCREWS CERVICAL FIVE-SIX, CERVICAL SIX-SEVEN;  Surgeon: Newman Pies, MD;  Location: [facility];  Service: Neurosurgery;  Laterality: N/A;  . BACK SURGERY  2003  . BIOPSY  04/17/2016   Procedure: BIOPSY;  Surgeon: Danie Binder, MD;  Location: [facility];   Service: Endoscopy;;  random colon bx's  . CERVICAL SPINE SURGERY  09/09/2017  . CESAREAN SECTION  1974, 1978      . COLONOSCOPY  06/19/2008   JSR:PRXYV internal hemorrhoids/mild sigmoin colon diverticulosis  . COLONOSCOPY  2012   Dr. Gala Romney: normal rectum, diverticula, lymphocytic colitis   . COLONOSCOPY WITH PROPOFOL N/A 04/17/2016   Dr. Oneida Alar: Normal terminal ileum, internal/external hemorrhoids, random colon biopsies consistent with collagenous colitis  . DILATION AND CURETTAGE OF UTERUS  1977   spontaneous abortion  . ESOPHAGOGASTRODUODENOSCOPY  06/19/2008   OPF:[id number] gastritis  . LUMBAR DISC ARTHROPLASTY  7/98  . LUMBAR FUSION  6/03, 11/08, 11/14   L4-5 fusion, L2-3 fusion, L1-2  . LUMBAR LAMINECTOMY  03/2020  . ODONTOID SCREW INSERTION N/A 01/27/2018   Procedure: ODONTOID SCREW INSERTION;  Surgeon: Newman Pies, MD;  Location: [facility];  Service: Neurosurgery;  Laterality: N/A;  ODONTOID SCREW INSERTION  . TONSILLECTOMY    . TONSILLECTOMY AND ADENOIDECTOMY    . TUBAL LIGATION      Allergies  Allergen Reactions  . Adalimumab Rash and Other (See Comments)    FEVER, Fast pulse  . Imuran [Azathioprine Sodium] Rash    Denies Airway involvement  . Methotrexate Other (See Comments)    Liver Enzyme elevation - after 1 month of use  . Plaquenil [Hydroxychloroquine Sulfate] Rash    Denies airway involvement.   Marland Kitchen  Sulfonamide Derivatives Rash    Occurred with Sulfasalazine. Rash only.   . Ceftin [Cefuroxime Axetil] Nausea Only    Upset stomach  . Morphine Nausea And Vomiting    IV morphine caused N and V  After surgery.   Has tolerated Kadian in the past     Social History   Tobacco Use  . Smoking status: Never Smoker  . Smokeless tobacco: Never Used  Substance Use Topics  . Alcohol use: No    Family History  Problem Relation Age of Onset  . Diabetes Mother   . Pancreatitis Mother   . Arthritis Mother        psoriatic   . Fibromyalgia Mother   . Rheumatic fever  Father   . Diabetes Other        grandparents  . Skin cancer Other        grandfather  . Hypertension Other        grandparent  . Congestive Heart Failure Other        grandfather  . Parkinson's disease Other        grandmother  . Colon cancer Maternal Uncle   . Fibromyalgia Sister   . Rheum arthritis Sister   . Diabetes Sister   . Fibromyalgia Sister   . Diabetes Sister   . Rheum arthritis Sister   . Hypertension Son   . Colon polyps Neg Hx    Prior to Admission medications   Medication Sig Start Date End Date Taking? Authorizing Provider  ascorbic acid (VITAMIN C) 500 MG tablet Take 500 mg by mouth 2 (two) times daily.   Yes [provider]  aspirin 81 MG tablet Take 81 mg by mouth daily.   Yes [provider]  azelastine (ASTELIN) 0.1 % nasal spray Place 1 spray into both nostrils 2 (two) times daily.  08/12/19  Yes [provider]  Calcium Carb-Cholecalciferol (CALCIUM 600 + D PO) Take 1 tablet by mouth 2 (two) times daily.    Yes [provider]  chlorhexidine (PERIDEX) 0.12 % solution 15 mLs by Mouth Rinse route 2 (two) times daily. 07/29/20  Yes [provider]  fish oil-omega-3 fatty acids 1000 MG capsule Take 1 g by mouth daily.   Yes [provider]  gabapentin (NEURONTIN) 300 MG capsule Take 300 mg by mouth See admin instructions. Take 300 mg 3 times daily, may take a 4th 300 mg dose as needed for pain 05/05/18  Yes [provider]  Ginger, Zingiber officinalis, (GINGER ROOT) 500 MG CAPS Take 500 mg by mouth daily.   Yes [provider]  Glucosamine Sulfate 1000 MG CAPS Take 2 capsules (2,000 mg total) by mouth daily. 07/03/11  Yes Hensel, Jamal Collin, MD  HUMULIN N KWIKPEN 100 UNIT/ML Kiwkpen Inject 10 Units into the skin at bedtime. 03/02/20  Yes [provider]  HYDROcodone-acetaminophen (NORCO/VICODIN) 5-325 MG tablet Take 1 tablet by mouth 2-3 times daily as needed for pain Patient taking  differently: Take 1 tablet by mouth 3 (three) times daily as needed (pain). 06/23/20  Yes Deveshwar, Abel Presto, MD  hydrocortisone sodium succinate (SOLU-CORTEF) 100 MG SOLR injection Inject 100 mg into the muscle daily as needed (when unable to keep down prednisone tablet).    Yes [provider]  ipratropium (ATROVENT) 0.06 % nasal spray Place 2 sprays into both nostrils 3 (three) times daily as needed for rhinitis.  07/19/19  Yes [provider]  losartan (COZAAR) 100 MG tablet Take 100 mg by  mouth daily. 12/24/18  Yes [provider]  metoprolol succinate (TOPROL-XL) 50 MG 24 hr tablet Take 1 tablet (50 mg total) by mouth daily. Take with or immediately following a meal. 07/03/11  Yes Hensel, Jamal Collin, MD  Misc Natural Products (TART CHERRY ADVANCED PO) Take 1 tablet by mouth daily. daily   Yes [provider]  Multiple Vitamin (MULTIVITAMIN) tablet Take 1 tablet by mouth daily.   Yes [provider]  nortriptyline (PAMELOR) 25 MG capsule TAKE (1) CAPSULE BY MOUTH AT BEDTIME. Patient taking differently: Take 25 mg by mouth at bedtime. 12/05/17  Yes Ofilia Neas, PA-C  omeprazole (PRILOSEC) 20 MG capsule TAKE ONE CAPSULE BY MOUTH TWICE DAILY BEFORE MEALS. Patient taking differently: Take 20 mg by mouth 2 (two) times daily. 05/17/20  Yes Mahala Menghini, PA-C  potassium chloride SA (K-DUR,KLOR-CON) 20 MEQ tablet Take 20 mEq by mouth daily.    Yes [provider]  predniSONE (DELTASONE) 5 MG tablet Take 5 mg by mouth daily with breakfast.   Yes [provider]  Secukinumab (COSENTYX SENSOREADY PEN) 150 MG/ML SOAJ Inject 150 mg into the skin every 14 (fourteen) days. 01/26/20  Yes Deveshwar, Abel Presto, MD  spironolactone (ALDACTONE) 25 MG tablet Take 25 mg by mouth daily.   Yes [provider]  tetrahydrozoline 0.05 % ophthalmic solution Place 1 drop into both eyes daily as needed (dry eyes).   Yes [provider]  topiramate  (TOPAMAX) 200 MG tablet TAKE (1) TABLET BY MOUTH AT BEDTIME. Patient taking differently: Take 100 mg by mouth at bedtime. 05/24/20  Yes Ofilia Neas, PA-C  Turmeric 500 MG CAPS Take 500 mg by mouth every morning.   Yes [provider]  albuterol (PROVENTIL HFA;VENTOLIN HFA) 108 (90 BASE) MCG/ACT inhaler Inhale 2 puffs into the lungs every 6 (six) hours as needed for wheezing or shortness of breath.     [provider]  Apremilast (OTEZLA) 30 MG TABS Take 1 tablet (30 mg total) by mouth 2 (two) times daily. 07/21/20   Ofilia Neas, PA-C  denosumab (PROLIA) 60 MG/ML SOSY injection Inject 60 mg into the skin every 6 (six) months.    [provider]  Lancets (ONETOUCH DELICA PLUS [id number]) MISC USE TO TEST 3 TIMESUDAILY. 07/01/19   [provider]  leflunomide (ARAVA) 20 MG tablet TAKE 1 TABLET BY MOUTH ONCE DAILY. Patient taking differently: Take 20 mg by mouth at bedtime. 05/09/20   Ofilia Neas, PA-C  ONETOUCH VERIO test strip  03/05/18   [provider]  SURE COMFORT INSULIN SYRINGE 31G X 5/16" 0.3 ML MISC  05/09/18   [provider]  SURE COMFORT PEN NEEDLES 31G X 5 MM [place]  05/05/18   [provider]  Glucosamine 500 MG TABS Take 4 tablets by mouth daily.    07/03/11  [provider]  simvastatin (ZOCOR) 40 MG tablet Take 20 mg by mouth at bedtime.   11/19/18  [provider]     Review of Systems  Positive ROS: As above  All other systems have been reviewed and were otherwise negative with the exception of those mentioned in the HPI and as above.  Objective: Vital signs in last 24 hours: Temp:  [98.6 F (37 C)] 98.6 F (37 C) (05/04 0700) Pulse Rate:  [82] 82 (05/04 0700) Resp:  [18] 18 (05/04 0700) BP: (161)/(67) 161/67 (05/04 0700) SpO2:  [97 %] 97 % (05/04 0700) Weight:  [64.4 kg] 64.4  kg (05/04 0700) Estimated body mass index is 28.68 kg/m as calculated from the following:   Height as of this  encounter: 4\' 11"  (1.499 m).   Weight as of this encounter: 64.4 kg.   General Appearance: Alert Head: Normocephalic, without obvious abnormality, atraumatic Eyes: PERRL, conjunctiva/corneas clear, EOM's intact,    Ears: Normal  Throat: Normal  Neck: Limited range of motion.  Her incisions well-healed. Back: Her lumbar incision is well-healed.  Lungs: Clear to auscultation bilaterally, respirations unlabored Heart: Regular rate and rhythm, no murmur, rub or gallop Abdomen: Soft, non-tender Extremities: Extremities normal, atraumatic, no cyanosis or edema Skin: unremarkable  NEUROLOGIC:   Mental status: alert and oriented,Motor Exam - grossly normal Sensory Exam - grossly normal Reflexes:  Coordination - grossly normal Gait - grossly normal Balance - grossly normal Cranial Nerves: I: smell Not tested  II: visual acuity  OS: Normal  OD: Normal   II: visual fields Full to confrontation  II: pupils Equal, round, reactive to light  III,VII: ptosis None  III,IV,VI: extraocular muscles  Full ROM  V: mastication Normal  V: facial light touch sensation  Normal  V,VII: corneal reflex  Present  VII: facial muscle function - upper  Normal  VII: facial muscle function - lower Normal  VIII: hearing Not tested  IX: soft palate elevation  Normal  IX,X: gag reflex Present  XI: trapezius strength  5/5  XI: sternocleidomastoid strength 5/5  XI: neck flexion strength  5/5  XII: tongue strength  Normal    Data Review Lab Results  Component Value Date   WBC 15.5 (H) 08/01/2020   HGB 12.9 08/01/2020   HCT 39.8 08/01/2020   MCV 96.6 08/01/2020   PLT 276 08/01/2020   Lab Results  Component Value Date   NA 137 08/01/2020   K 4.4 08/01/2020   CL 105 08/01/2020   CO2 23 08/01/2020   BUN 23 08/01/2020   CREATININE 0.82 08/01/2020   GLUCOSE 162 (H) 08/01/2020   No results found for: INR, PROTIME  Assessment/Plan: Lumbar spinal stenosis, lumbar radiculopathy, lumbago: I have  discussed situation with the patient.  I reviewed her imaging studies with her and pointed out the abnormalities.  We have discussed the various treatment options including surgery.  I have described the surgical treatment option of exploration of her lumbar fusion with an L4-5 decompression, instrumentation and fusion.  I have shown her surgical models.  I have given her a surgical pamphlet.  We have discussed the risk, benefits, alternatives, expected postop course, and likelihood of achieving our goals with surgery.  I have answered all her questions.  She has decided proceed with surgery.   Ophelia Charter 08/03/2020 8:24 AM

## 2020-08-04 ENCOUNTER — Ambulatory Visit (HOSPITAL_COMMUNITY): Payer: PPO

## 2020-08-04 LAB — CBC
HCT: 31.1 % — ABNORMAL LOW (ref 36.0–46.0)
Hemoglobin: 10.3 g/dL — ABNORMAL LOW (ref 12.0–15.0)
MCH: 31.9 pg (ref 26.0–34.0)
MCHC: 33.1 g/dL (ref 30.0–36.0)
MCV: 96.3 fL (ref 80.0–100.0)
Platelets: 210 10*3/uL (ref 150–400)
RBC: 3.23 MIL/uL — ABNORMAL LOW (ref 3.87–5.11)
RDW: 14.3 % (ref 11.5–15.5)
WBC: 15 10*3/uL — ABNORMAL HIGH (ref 4.0–10.5)
nRBC: 0 % (ref 0.0–0.2)

## 2020-08-04 LAB — GLUCOSE, CAPILLARY: Glucose-Capillary: 148 mg/dL — ABNORMAL HIGH (ref 70–99)

## 2020-08-04 LAB — BASIC METABOLIC PANEL
Anion gap: 12 (ref 5–15)
BUN: 12 mg/dL (ref 8–23)
CO2: 22 mmol/L (ref 22–32)
Calcium: 8.4 mg/dL — ABNORMAL LOW (ref 8.9–10.3)
Chloride: 99 mmol/L (ref 98–111)
Creatinine, Ser: 0.97 mg/dL (ref 0.44–1.00)
GFR, Estimated: >60 mL/min (ref >60)
Glucose, Bld: 233 mg/dL — ABNORMAL HIGH (ref 70–99)
Potassium: 4 mmol/L (ref 3.5–5.1)
Sodium: 133 mmol/L — ABNORMAL LOW (ref 135–145)

## 2020-08-04 MED ORDER — OXYCODONE-ACETAMINOPHEN 5-325 MG PO TABS: 1.0000 | ORAL_TABLET | ORAL | 0 refills | Status: DC | PRN

## 2020-08-04 MED ORDER — CYCLOBENZAPRINE HCL 10 MG PO TABS: 10.0000 mg | ORAL_TABLET | Freq: Three times a day (TID) | ORAL | 0 refills | Status: DC | PRN

## 2020-08-04 MED ORDER — DOCUSATE SODIUM 100 MG PO CAPS: 100.0000 mg | ORAL_CAPSULE | Freq: Two times a day (BID) | ORAL | 0 refills | Status: DC

## 2020-08-04 MED ORDER — OXYCODONE-ACETAMINOPHEN 5-325 MG PO TABS: 1.0000 | ORAL_TABLET | ORAL | Status: DC | PRN

## 2020-08-04 NOTE — Evaluation (Signed)
Physical Therapy Evaluation Patient Details Name: Tonya Hall MRN: [id number] DOB: 05-Nov-1950 Today's Date: 08/04/2020   History of Present Illness  Pt is a 70 y/o female admitted for L4-5 PLIF on 08/03/2020.  PMH Includes: Raynaud's disease, psoriatic arthritis, PNA, peripheral edema, osteoporosis, neuropathy, Fibromyalgia, DM, COVID - 19, s/p prior lumber fusion and cervical fusion.  Clinical Impression  Pt admitted with above diagnosis. At the time of PT eval, pt was able to demonstrate transfers and ambulation with gross supervision for safety and RW for support. Pt was educated on precautions, brace application/wearing schedule, appropriate activity progression, and car transfer. Pt currently with functional limitations due to the deficits listed below (see PT Problem List). Pt will benefit from skilled PT to increase their independence and safety with mobility to allow discharge to the venue listed below.      Follow Up Recommendations No PT follow up;Supervision for mobility/OOB    Equipment Recommendations  None recommended by PT    Recommendations for Other Services       Precautions / Restrictions Precautions Precautions: Back;Fall Precaution Booklet Issued: Yes (comment) Precaution Comments: Reviewed handout with pt and she was cued for precautions during functional mobility. Restrictions Weight Bearing Restrictions: No      Mobility  Bed Mobility Overal bed mobility: Modified Independent             General bed mobility comments: HOB flat and rails lowered to simulate home environment. No assist required. Pt was able to transition to sidelying well.    Transfers Overall transfer level: Needs assistance Equipment used: Rolling walker (2 wheeled);None Transfers: Sit to/from Stand Sit to Stand: Supervision Stand pivot transfers: Supervision       General transfer comment: Pt demonstrated proper hand placement on seated surface for safety to stand, however  required cues when returning to sitting at end of session. No assist required to power-up to full stand.  Ambulation/Gait Ambulation/Gait assistance: Supervision Gait Distance (Feet): 200 Feet Assistive device: Rolling walker (2 wheeled);None Gait Pattern/deviations: Step-through pattern;Decreased stride length;Trunk flexed Gait velocity: Decreased Gait velocity interpretation: <1.31 ft/sec, indicative of household ambulator General Gait Details: Slow and guarded. Pt wanted to attempt without RW and was reaching out for support in room. Encouraged pt to use the RW and she agreed she was more comfortable with it.  Stairs Stairs:  (Pt declined stair training.)          Wheelchair Mobility    Modified Rankin (Stroke Patients Only)       Balance Overall balance assessment: Needs assistance Sitting-balance support: No upper extremity supported;Feet supported Sitting balance-Leahy Scale: Fair     Standing balance support: No upper extremity supported;During functional activity Standing balance-Leahy Scale: Poor Standing balance comment: Reaching out for external support throughout mobility in the room without RW.                             Pertinent Vitals/Pain Pain Assessment: Faces Faces Pain Scale: Hurts even more Pain Location: back Pain Descriptors / Indicators: Operative site guarding Pain Intervention(s): Limited activity within patient's tolerance;Monitored during session;Premedicated before session;Repositioned    Home Living Family/patient expects to be discharged to:: Private residence Living Arrangements: Spouse/significant other Available Help at Discharge: Family;Available 24 hours/day Type of Home: House Home Access: Stairs to enter Entrance Stairs-Rails: Chemical engineer of Steps: 3 Home Layout: Two level;Able to live on main level with bedroom/bathroom Home Equipment: Bedside commode;Walker - 2 wheels;Cane -  single point       Prior Function Level of Independence: Independent         Comments: Retired Therapist, sports CM.  Spouse able to provide supervision/assist as needed     Hand Dominance   Dominant Hand: Right    Extremity/Trunk Assessment   Upper Extremity Assessment Upper Extremity Assessment: Defer to OT evaluation    Lower Extremity Assessment Lower Extremity Assessment: Generalized weakness (Consistent with decreased strength and muscular endurance pt experiencing prior to surgery)    Cervical / Trunk Assessment Cervical / Trunk Assessment: Other exceptions Cervical / Trunk Exceptions: s/p lumbar surgery  Communication   Communication: No difficulties  Cognition Arousal/Alertness: Awake/alert Behavior During Therapy: WFL for tasks assessed/performed Overall Cognitive Status: Within Functional Limits for tasks assessed                                        General Comments      Exercises     Assessment/Plan    PT Assessment Patient needs continued PT services  PT Problem List Decreased strength;Decreased activity tolerance;Decreased balance;Decreased mobility;Decreased knowledge of use of DME;Decreased safety awareness;Decreased knowledge of precautions;Pain       PT Treatment Interventions DME instruction;Gait training;Functional mobility training;Stair training;Therapeutic activities;Balance training;Therapeutic exercise;Neuromuscular re-education;Patient/family education    PT Goals (Current goals can be found in the Care Plan section)  Acute Rehab PT Goals Patient Stated Goal: to go home today PT Goal Formulation: With patient Time For Goal Achievement: 08/11/20 Potential to Achieve Goals: Good    Frequency Min 5X/week   Barriers to discharge        Co-evaluation               AM-PAC PT "6 Clicks" Mobility  Outcome Measure Help needed turning from your back to your side while in a flat bed without using bedrails?: None Help needed moving from  lying on your back to sitting on the side of a flat bed without using bedrails?: A Little Help needed moving to and from a bed to a chair (including a wheelchair)?: A Little Help needed standing up from a chair using your arms (e.g., wheelchair or bedside chair)?: A Little Help needed to walk in hospital room?: A Little Help needed climbing 3-5 steps with a railing? : A Little 6 Click Score: 19    End of Session Equipment Utilized During Treatment: Gait belt;Back brace Activity Tolerance: Patient limited by fatigue;Patient limited by pain Patient left: in bed;with call bell/phone within reach Nurse Communication: Mobility status PT Visit Diagnosis: Unsteadiness on feet (R26.81);Pain Pain - part of body:  (back)    Time: 6283-1517 PT Time Calculation (min) (ACUTE ONLY): 14 min   Charges:   PT Evaluation $PT Eval Low Complexity: 1 Low          Rolinda Roan, PT, DPT Acute Rehabilitation Services Pager: [phone] Office: [phone]   Thelma Comp 08/04/2020, 10:05 AM

## 2020-08-04 NOTE — Discharge Summary (Signed)
Physician Discharge Summary  Patient ID: Tonya Hall MRN: [id number] DOB/AGE: [birth year] 71 y.o.  Admit date: 08/03/2020 Discharge date: 08/04/2020  Admission Diagnoses: Lumbar degenerative disease, lumbar foraminal stenosis, lumbago, lumbar radiculopathy  Discharge Diagnoses: The same Active Problems:   Foraminal stenosis of lumbar region   Discharged Condition: good  Hospital Course: I performed an L4-5 decompression, instrumentation and fusion on patient on 08/03/2020.  The surgery went well.  The patient's postoperative course was unremarkable.  On postoperative day #1 she requested discharge to home.  The patient, and her husband, were given oral discharge instructions and all their questions were answered.  Consults: PT, OT, care management Significant Diagnostic Studies: None Treatments: L4-5 decompression, instrumentation and fusion Discharge Exam: Blood pressure (!) 130/56, pulse (!) 101, temperature 97.6 F (36.4 C), temperature source Oral, resp. rate 16, height 4\' 11"  (1.499 m), weight 64.4 kg, last menstrual period 02/01/1999, SpO2 99 %. The patient is alert and pleasant.  She looks well.  Her strength is normal.  Her dressing is clean and dry.  Disposition: Home  Discharge Instructions    Call MD for:  difficulty breathing, headache or visual disturbances   Complete by: As directed    Call MD for:  extreme fatigue   Complete by: As directed    Call MD for:  hives   Complete by: As directed    Call MD for:  persistant dizziness or light-headedness   Complete by: As directed    Call MD for:  persistant nausea and vomiting   Complete by: As directed    Call MD for:  redness, tenderness, or signs of infection (pain, swelling, redness, odor or green/yellow discharge around incision site)   Complete by: As directed    Call MD for:  severe uncontrolled pain   Complete by: As directed    Call MD for:  temperature >100.4   Complete by: As directed    Diet - low  sodium heart healthy   Complete by: As directed    Discharge instructions   Complete by: As directed    Call [phone] for a followup appointment. Take a stool softener while you are using pain medications.   Driving Restrictions   Complete by: As directed    Do not drive for 2 weeks.   Increase activity slowly   Complete by: As directed    Lifting restrictions   Complete by: As directed    Do not lift more than 5 pounds. No excessive bending or twisting.   May shower / Bathe   Complete by: As directed    Remove the dressing for 3 days after surgery.  You may shower, but leave the incision alone.   Remove dressing in 48 hours   Complete by: As directed      Allergies as of 08/04/2020      Reactions   Adalimumab Rash, Other (See Comments)   FEVER, Fast pulse   Imuran [azathioprine Sodium] Rash   Denies Airway involvement   Methotrexate Other (See Comments)   Liver Enzyme elevation - after 1 month of use   Plaquenil [hydroxychloroquine Sulfate] Rash   Denies airway involvement.    Sulfonamide Derivatives Rash   Occurred with Sulfasalazine. Rash only.    Ceftin [cefuroxime Axetil] Nausea Only   Upset stomach   Morphine Nausea And Vomiting   IV morphine caused N and V  After surgery.   Has tolerated [place] in the past       Medication List  STOP taking these medications   HYDROcodone-acetaminophen 5-325 MG tablet Commonly known as: NORCO/VICODIN     TAKE these medications   albuterol 108 (90 Base) MCG/ACT inhaler Commonly known as: VENTOLIN HFA Inhale 2 puffs into the lungs every 6 (six) hours as needed for wheezing or shortness of breath.   ascorbic acid 500 MG tablet Commonly known as: VITAMIN C Take 500 mg by mouth 2 (two) times daily.   aspirin 81 MG tablet Take 81 mg by mouth daily.   azelastine 0.1 % nasal spray Commonly known as: ASTELIN Place 1 spray into both nostrils 2 (two) times daily.   CALCIUM 600 + D PO Take 1 tablet by mouth 2 (two) times  daily.   chlorhexidine 0.12 % solution Commonly known as: PERIDEX 15 mLs by Mouth Rinse route 2 (two) times daily.   Cosentyx Sensoready Pen 150 MG/ML Soaj Generic drug: Secukinumab Inject 150 mg into the skin every 14 (fourteen) days.   cyclobenzaprine 10 MG tablet Commonly known as: FLEXERIL Take 1 tablet (10 mg total) by mouth 3 (three) times daily as needed for muscle spasms.   denosumab 60 MG/ML Sosy injection Commonly known as: PROLIA Inject 60 mg into the skin every 6 (six) months.   docusate sodium 100 MG capsule Commonly known as: COLACE Take 1 capsule (100 mg total) by mouth 2 (two) times daily.   fish oil-omega-3 fatty acids 1000 MG capsule Take 1 g by mouth daily.   gabapentin 300 MG capsule Commonly known as: NEURONTIN Take 300 mg by mouth See admin instructions. Take 300 mg 3 times daily, may take a 4th 300 mg dose as needed for pain   Ginger Root 500 MG Caps Take 500 mg by mouth daily.   Glucosamine Sulfate 1000 MG Caps Take 2 capsules (2,000 mg total) by mouth daily.   HumuLIN N KwikPen 100 UNIT/ML Kiwkpen Generic drug: Insulin NPH (Human) (Isophane) Inject 10 Units into the skin at bedtime.   ipratropium 0.06 % nasal spray Commonly known as: ATROVENT Place 2 sprays into both nostrils 3 (three) times daily as needed for rhinitis.   leflunomide 20 MG tablet Commonly known as: ARAVA TAKE 1 TABLET BY MOUTH ONCE DAILY. What changed: when to take this   losartan 100 MG tablet Commonly known as: COZAAR Take 100 mg by mouth daily.   metoprolol succinate 50 MG 24 hr tablet Commonly known as: TOPROL-XL Take 1 tablet (50 mg total) by mouth daily. Take with or immediately following a meal.   multivitamin tablet Take 1 tablet by mouth daily.   nortriptyline 25 MG capsule Commonly known as: PAMELOR TAKE (1) CAPSULE BY MOUTH AT BEDTIME. What changed: See the new instructions.   omeprazole 20 MG capsule Commonly known as: PRILOSEC TAKE ONE CAPSULE BY  MOUTH TWICE DAILY BEFORE MEALS. What changed: when to take this   OneTouch Delica Plus [id number] Misc USE TO TEST 3 TIMESUDAILY.   OneTouch Verio test strip Generic drug: glucose blood   Otezla 30 MG Tabs Generic drug: Apremilast Take 1 tablet (30 mg total) by mouth 2 (two) times daily.   oxyCODONE-acetaminophen 5-325 MG tablet Commonly known as: PERCOCET/ROXICET Take 1-2 tablets by mouth every 4 (four) hours as needed for moderate pain.   potassium chloride SA 20 MEQ tablet Commonly known as: KLOR-CON Take 20 mEq by mouth daily.   predniSONE 5 MG tablet Commonly known as: DELTASONE Take 5 mg by mouth daily with breakfast.   Solu-CORTEF 100 MG Solr injection Generic drug:  hydrocortisone sodium succinate Inject 100 mg into the muscle daily as needed (when unable to keep down prednisone tablet).   spironolactone 25 MG tablet Commonly known as: ALDACTONE Take 25 mg by mouth daily.   Sure Comfort Insulin Syringe 31G X 5/16" 0.3 ML Misc Generic drug: Insulin Syringe-Needle U-100   Sure Comfort Pen Needles 31G X 5 MM Misc Generic drug: Insulin Pen Needle   TART CHERRY ADVANCED PO Take 1 tablet by mouth daily. daily   tetrahydrozoline 0.05 % ophthalmic solution Place 1 drop into both eyes daily as needed (dry eyes).   topiramate 200 MG tablet Commonly known as: TOPAMAX TAKE (1) TABLET BY MOUTH AT BEDTIME. What changed: See the new instructions.   Turmeric 500 MG Caps Take 500 mg by mouth every morning.        Signed: Ophelia Charter 08/04/2020, 9:57 AM

## 2020-08-04 NOTE — Evaluation (Signed)
Occupational Therapy Evaluation Patient Details Name: Tonya Hall MRN: [id number] DOB: [birth year] Today's Date: 08/04/2020    History of Present Illness 70 y.o. female admitted for L4-5 PLIF.  PMH Includes: Raynaud's disease, psoriateic arthritis, PNA, peripheral edema, osteoporosis, neuropathy, Fibromyalgia, DM, COVID - 19, s/p lumber fusion, s/p cervical fusion   Clinical Impression   .Patient evaluated by Occupational Therapy with no further acute OT needs identified. All education has been completed and the patient has no further questions. All education completed.  Pt is able to perform ADLs with supervision.  She has good support at home.  See below for any follow-up Occupational Therapy or equipment needs. OT is signing off. Thank you for this referral.      Follow Up Recommendations  No OT follow up;Supervision - Intermittent    Equipment Recommendations  None recommended by OT    Recommendations for Other Services       Precautions / Restrictions Precautions Precautions: Back Precaution Booklet Issued: Yes (comment) Precaution Comments: back precautions reviewed with pt.  She required min cues to adhere.  Pt sitting up in recliner without brace.  Educated her on wear schedule and need to have brace on when up.  She stated "now, you know I'm not going to do that".  Encouraged her to discuss brace wear with MD Restrictions Weight Bearing Restrictions: No      Mobility Bed Mobility               General bed mobility comments: pt sitting up in the chair    Transfers Overall transfer level: Needs assistance Equipment used: Rolling walker (2 wheeled) Transfers: Sit to/from [employer] Transfers Sit to Stand: Supervision Stand pivot transfers: Supervision       General transfer comment: vebal cues for hand placement    Balance Overall balance assessment: Mild deficits observed, not formally tested                                          ADL either performed or assessed with clinical judgement   ADL Overall ADL's : Needs assistance/impaired Eating/Feeding: Independent   Grooming: Wash/dry hands;Wash/dry face;Oral care;Brushing hair;Supervision/safety;Standing   Upper Body Bathing: Set up;Supervision/ safety;Sitting   Lower Body Bathing: Supervison/ safety;Sit to/from stand Lower Body Bathing Details (indicate cue type and reason): Pt able to perform figure 4 Upper Body Dressing : Supervision/safety;Sitting   Lower Body Dressing: Supervision/safety;Sit to/from stand Lower Body Dressing Details (indicate cue type and reason): able to perform figure 4 Toilet Transfer: Supervision/safety;Ambulation;Regular Toilet   Toileting- Water quality scientist and Hygiene: Supervision/safety;Sit to/from stand       Functional mobility during ADLs: Supervision/safety       Vision Baseline Vision/History: Wears glasses Wears Glasses: At all times Patient Visual Report: No change from baseline       Perception     Praxis      Pertinent Vitals/Pain Pain Assessment: Faces Faces Pain Scale: Hurts a little bit Pain Location: back Pain Descriptors / Indicators: Operative site guarding Pain Intervention(s): Monitored during session     Hand Dominance Right   Extremity/Trunk Assessment Upper Extremity Assessment Upper Extremity Assessment: Overall WFL for tasks assessed   Lower Extremity Assessment Lower Extremity Assessment: Defer to PT evaluation   Cervical / Trunk Assessment Cervical / Trunk Assessment: Other exceptions Cervical / Trunk Exceptions: s/p lumbar surgery   Communication Communication Communication: No difficulties  Cognition Arousal/Alertness: Awake/alert Behavior During Therapy: WFL for tasks assessed/performed Overall Cognitive Status: Within Functional Limits for tasks assessed                                     General Comments       Exercises     Shoulder  Instructions      Home Living Family/patient expects to be discharged to:: Private residence Living Arrangements: Spouse/significant other Available Help at Discharge: Family;Available 24 hours/day Type of Home: House Home Access: Stairs to enter [employer] of Steps: 3 Entrance Stairs-Rails: Left;Right Home Layout: Two level;Able to live on main level with bedroom/bathroom     Bathroom Shower/Tub: Occupational psychologist: Standard     Home Equipment: Bedside commode;Walker - 2 wheels;Cane - single point          Prior Functioning/Environment Level of Independence: Independent        Comments: Retired Special educational needs teacher.  Spouse able to provide supervision/assist as needed        OT Problem List: Decreased activity tolerance;Decreased knowledge of use of DME or AE;Decreased knowledge of precautions;Pain      OT Treatment/Interventions:      OT Goals(Current goals can be found in the care plan section) Acute Rehab OT Goals Patient Stated Goal: to go home today OT Goal Formulation: All assessment and education complete, DC therapy  OT Frequency:     Barriers to D/C:            Co-evaluation              AM-PAC OT "6 Clicks" Daily Activity     Outcome Measure Help from another person eating meals?: None Help from another person taking care of personal grooming?: A Little Help from another person toileting, which includes using toliet, bedpan, or urinal?: A Little Help from another person bathing (including washing, rinsing, drying)?: A Little Help from another person to put on and taking off regular upper body clothing?: A Little Help from another person to put on and taking off regular lower body clothing?: A Little 6 Click Score: 19   End of Session Equipment Utilized During Treatment: Rolling walker;Back brace Nurse Communication: Mobility status  Activity Tolerance: Patient tolerated treatment well Patient left: in chair;with call  bell/phone within reach  OT Visit Diagnosis: Unsteadiness on feet (R26.81);Pain Pain - part of body:  (back)                Time: 3810-1751 OT Time Calculation (min): 23 min Charges:  OT General Charges $OT Visit: 1 Visit OT Evaluation $OT Eval Low Complexity: 1 Low OT Treatments $Self Care/Home Management : 8-22 mins  Nilsa Nutting OTR/L Acute Rehabilitation Services Pager [phone] Office [phone]   Lucille Passy M 08/04/2020, 9:50 AM

## 2020-08-04 NOTE — Progress Notes (Signed)
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

## 2020-08-08 ENCOUNTER — Other Ambulatory Visit: Payer: Self-pay

## 2020-08-08 MED FILL — Sodium Chloride IV Soln 0.9%: INTRAVENOUS | Qty: 1000 | Status: AC

## 2020-08-08 MED FILL — Heparin Sodium (Porcine) Inj 1000 Unit/ML: INTRAMUSCULAR | Qty: 30 | Status: AC

## 2020-08-08 NOTE — Telephone Encounter (Signed)
Patient had lumbar fusion on 08/03/20.  Patient states she did great after surgery and was discharged.  Patient states she only needed a cane when she got home, but by the second day needed a walker and by Friday, 5/6 needed a wheelchair to get around.  Patient states her legs feel weak and cramp up.  Patient states she can only stand long enough to brush her teeth.  Patient states Dr. Arnoldo Morale wanted her to call Dr. Estanislado Pandy to see if increasing her Prednisone medication could help with her leg pain and weakness.  Patient requested a return call.

## 2020-08-09 ENCOUNTER — Ambulatory Visit (HOSPITAL_BASED_OUTPATIENT_CLINIC_OR_DEPARTMENT_OTHER): Payer: PPO | Admitting: Obstetrics & Gynecology

## 2020-08-09 MED ORDER — PREDNISONE 5 MG PO TABS: ORAL_TABLET | ORAL | 0 refills | Status: DC

## 2020-08-09 NOTE — Telephone Encounter (Signed)
Prednisone prescription pended. Please review and send to the pharmacy. Thanks!

## 2020-08-09 NOTE — Telephone Encounter (Signed)
I returned patient's call.  She states she has been taking leflunomide.  She has been off Cosentyx for a while.  She states she is feeling weak all over.  Dr. Arnoldo Morale wants her to take prednisone taper but he wanted me to prescribe it.  Patient states that she has been in touch with Dr. Chalmers Cater and she gives her insulin sliding scale.  She is comfortable taking insulin.  Please call in prednisone 5 mg tablets 20 mg p.o. daily for 4 days, 15 mg p.o. daily for 4 days, 10 mg p.o. daily for 4 days then she can stay on 5 mg p.o. daily.  She was advised to monitor her blood sugar closely while she is on higher dose of prednisone.  When she gets clearance from Dr. Arnoldo Morale then she can come in to start [place].

## 2020-08-13 ENCOUNTER — Other Ambulatory Visit: Payer: Self-pay | Admitting: Physician Assistant

## 2020-08-15 NOTE — Telephone Encounter (Signed)
Next Visit: 09/14/2020  Last Visit: 06/08/2020  Last Fill: 05/09/2020  DX: Psoriatic arthritis   Current Dose per office note 06/08/2020, Arava 20mg  po qd  Labs: 08/04/2020, WBC 15.0, RBC 3.23, Hemoglobin 10.3, HCT 31.1, Sodium 133, Glucose 233, Calcium 8.4,   Okay to refill Arava?

## 2020-08-19 DIAGNOSIS — M48061 Spinal stenosis, lumbar region without neurogenic claudication: Secondary | ICD-10-CM | POA: Diagnosis not present

## 2020-08-20 ENCOUNTER — Other Ambulatory Visit: Payer: Self-pay | Admitting: Physician Assistant

## 2020-08-22 ENCOUNTER — Telehealth: Payer: Self-pay

## 2020-08-22 NOTE — Telephone Encounter (Signed)
I called patient, appt 08/23/2020

## 2020-08-22 NOTE — Progress Notes (Signed)
Office Visit Note  Patient: Tonya Hall             Date of Birth: 06/03/1950           MRN: [id number]             PCP: [name], MD Referring: [name], MD Visit Date: 08/23/2020 Occupation: @GUAROCC @  Subjective:  Pain in both legs   History of Present Illness: Tonya Hall is a 70 y.o. female with history of psoriatic arthritis, osteoarthritis, DDD, and osteoporosis.  She is taking Otezla 30 mg 1 tablet by mouth twice daily and Arava 20 mg 1 tablet by mouth daily.  She remains on prednisone 5 mg 1 tablet daily.  She continues to hold Cosentyx but is ready to start on Taltz.  According to the patient she was cleared by Dr. Arnoldo Morale to resume all of her medications after surgery.  She underwent a lumbar fusion on 08/03/2020.  According to the patient initially after surgery her pain was very well managed but 2 to 3 days after surgery she was unable to walk.  She states that she was having to use a wheelchair and was in severe pain for several weeks.  She was prescribed a prednisone taper which did not improve her bilateral leg pain. She has gradually noticed less muscle weakness as well as improved discomfort.  She continues to have pain in both SI joints and over both trochanteric bursa.  She would like a right trochanteric bursa cortisone injection today.  She plans on continuing to use a walker to assist with ambulation. She states while taking prednisone the pain and swelling in her hands improved.  She is able to make a complete fist which she has been unable to do for a long time.      Activities of Daily Living:  Patient reports morning stiffness for 24 hours.   Patient Reports nocturnal pain.  Difficulty dressing/grooming: Reports Difficulty climbing stairs: Reports Difficulty getting out of chair: Reports Difficulty using hands for taps, buttons, cutlery, and/or writing: Reports  Review of Systems  Constitutional: Positive for fatigue.  HENT: Positive for mouth  dryness and nose dryness. Negative for mouth sores.   Eyes: Positive for visual disturbance and dryness. Negative for pain and itching.  Respiratory: Negative for cough, hemoptysis, shortness of breath and difficulty breathing.   Cardiovascular: Positive for irregular heartbeat and swelling in legs/feet. Negative for chest pain and palpitations.  Gastrointestinal: Negative for abdominal pain, blood in stool, constipation and diarrhea.  Endocrine: Negative for increased urination.  Genitourinary: Negative for painful urination.  Musculoskeletal: Positive for arthralgias, joint pain, joint swelling, myalgias, muscle weakness, morning stiffness, muscle tenderness and myalgias.  Skin: Negative for color change, rash and redness.  Allergic/Immunologic: Negative for susceptible to infections.  Neurological: Positive for weakness. Negative for dizziness, numbness, headaches and memory loss.  Hematological: Negative for swollen glands.  Psychiatric/Behavioral: Positive for sleep disturbance. Negative for confusion.    PMFS History:  Patient Active Problem List   Diagnosis Date Noted  . Foraminal stenosis of lumbar region 08/03/2020  . Cervical spondylosis with myelopathy and radiculopathy 03/09/2020  . AKI (acute kidney injury) ([place]) 02/07/2019  . Acute respiratory failure with hypoxia ([place]) 02/06/2019  . Acute respiratory disease due to COVID-19 virus 02/06/2019  . Community acquired pneumonia 02/06/2019  . Hyponatremia 02/06/2019  . Asthma   . Diabetes mellitus without complication ([place])   . Closed nondisplaced odontoid fracture with type II morphology ([place]) 01/27/2018  .  Odontoid fracture ([place]) 01/24/2018  . Chest pain 11/14/2017  . Transaminitis 06/22/2016  . Collagenous colitis 05/23/2016  . Psoriasis 05/21/2016  . High risk medication use 02/15/2016  . Age-related osteoporosis without current pathological fracture 02/15/2016  . Trochanteric bursitis, left hip 02/15/2016  .  Sacroiliitis, not elsewhere classified ([place]) 02/15/2016  . Chronic pain syndrome 02/15/2016  . Abnormal weight gain 06/25/2012  . Hypertension 07/03/2011  . Hypercholesteremia 07/03/2011  . Psoriatic arthritis ([place]) 07/03/2011  . Osteoarthritis of multiple joints 07/03/2011  . Esophageal motility disorder 02/14/2011  . Cough 02/14/2011  . GERD 05/05/2010    Past Medical History:  Diagnosis Date  . Asthma    Albuterol in haler prn  . Cataract    immature unsure which eye  . Chronic back pain   . COVID-19 October/November 2020  . Degenerative disk disease    psoriatic  . Diabetes mellitus without complication (HCC)    diet controlled  . Esophageal motility disorder    Non-specific, see modified barium study/speech path, BP  . Fibromyalgia   . GERD (gastroesophageal reflux disease)    takes Protonix bid  . Headache 2019  . History of blood transfusion    post c-section  . History of bronchitis   . HTN (hypertension)   . Hyperlipidemia    takes Pravastatin daily  . Lymphocytic colitis 05/26/2010   Responded to Entocort x 3 MOS  . Neuropathy   . Nonallergic rhinitis   . Osteoporosis    gets Boniva every 3 months  . Peripheral edema    takes HCTZ daily  . Pneumonia 02/2019  . Psoriatic arthritis ([place])    Dr. Katherina Right  . Psoriatic arthritis ([place])    bilateral hands  . Raynaud's disease   . Seasonal allergies   . Shingles 2020  . SUI (stress urinary incontinence, female)   . Urinary urgency     Family History  Problem Relation Age of Onset  . Diabetes Mother   . Pancreatitis Mother   . Arthritis Mother        psoriatic   . Fibromyalgia Mother   . Rheumatic fever Father   . Diabetes Other        grandparents  . Skin cancer Other        grandfather  . Hypertension Other        grandparent  . Congestive Heart Failure Other        grandfather  . Parkinson's disease Other        grandmother  . Colon cancer Maternal Uncle   . Fibromyalgia Sister   . Rheum  arthritis Sister   . Diabetes Sister   . Fibromyalgia Sister   . Diabetes Sister   . Rheum arthritis Sister   . Hypertension Son   . Colon polyps Neg Hx    Past Surgical History:  Procedure Laterality Date  . ANTERIOR CERVICAL DECOMP/DISCECTOMY FUSION N/A 03/09/2020   Procedure: ANTERIOR CERVICAL DECOMPRESSION/DISCECTOMY FUSION, INTERBODY PROSTHESIS, PLATE SCREWS CERVICAL FIVE-SIX, CERVICAL SIX-SEVEN;  Surgeon: Newman Pies, MD;  Location: [facility];  Service: Neurosurgery;  Laterality: N/A;  . BACK SURGERY  2003  . BIOPSY  04/17/2016   Procedure: BIOPSY;  Surgeon: Danie Binder, MD;  [facility];  Service: Endoscopy;;  random colon bx's  . CERVICAL SPINE SURGERY  09/09/2017  . CESAREAN SECTION  1974, 1978      . COLONOSCOPY  06/19/2008   [id number] internal hemorrhoids/mild sigmoin colon diverticulosis  . COLONOSCOPY  2012  Dr. Gala Romney: normal rectum, diverticula, lymphocytic colitis   . COLONOSCOPY WITH PROPOFOL N/A 04/17/2016   Dr. Oneida Alar: Normal terminal ileum, internal/external hemorrhoids, random colon biopsies consistent with collagenous colitis  . DILATION AND CURETTAGE OF UTERUS  1977   spontaneous abortion  . ESOPHAGOGASTRODUODENOSCOPY  06/19/2008   [id number] gastritis  . LUMBAR DISC ARTHROPLASTY  7/98  . LUMBAR FUSION  6/03, 11/08, 11/14   L4-5 fusion, L2-3 fusion, L1-2  . LUMBAR FUSION  08/03/2020   L4-L5  . LUMBAR LAMINECTOMY  03/2020  . ODONTOID SCREW INSERTION N/A 01/27/2018   Procedure: ODONTOID SCREW INSERTION;  Surgeon: Newman Pies, MD;  Location: [facility];  Service: Neurosurgery;  Laterality: N/A;  ODONTOID SCREW INSERTION  . TONSILLECTOMY    . TONSILLECTOMY AND ADENOIDECTOMY    . TUBAL LIGATION     Social History   Social History Narrative   ATTENDS [employer]. 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   Immunization History  Administered Date(s) Administered  . Influenza,inj,Quad PF,6+ Mos 01/21/2017  . Influenza-Unspecified 01/30/2017   . Moderna Sars-Covid-2 Vaccination 06/01/2019, 07/02/2019  . Pneumococcal Polysaccharide-23 02/19/2013  . Tdap 09/10/2016     Objective: Vital Signs: BP (!) 153/70 (BP Location: Left Arm, Patient Position: Sitting, Cuff Size: Normal)   Pulse (!) 102   Ht 4\' 10"  (1.473 m)   Wt 143 lb 6.4 oz (65 kg)   LMP 02/01/1999   BMI 29.97 kg/m    Physical Exam Vitals and nursing note reviewed.  Constitutional:      Appearance: She is well-developed.  HENT:     Head: Normocephalic and atraumatic.  Eyes:     Conjunctiva/sclera: Conjunctivae normal.  Pulmonary:     Effort: Pulmonary effort is normal.  Abdominal:     Palpations: Abdomen is soft.  Musculoskeletal:     Cervical back: Normal range of motion.  Skin:    General: Skin is warm and dry.     Capillary Refill: Capillary refill takes less than 2 seconds.  Neurological:     Mental Status: She is alert and oriented to person, place, and time.  Psychiatric:        Behavior: Behavior normal.      Musculoskeletal Exam: C-spine limited ROM.  Postural thoracic kyphosis.  Tenderness over both SI joints.  Painful ROM of right shoulder joint.  Elbow joints and wrist joints good ROM with no discomfort. Tenderness and synovitis of right 3rd PIP joint.  PIP and DIP thickening and prominence noted.  CMC joint thickening bilaterally.  Knee joints good ROM with no warmth or effusion.  Ankle joints good ROM with no tenderness or swelling.  No achilles tendonitis or plantar fasciitis.   CDAI Exam: CDAI Score: -- Patient Global: --; Provider Global: -- Swollen: --; Tender: -- Joint Exam 08/23/2020   No joint exam has been documented for this visit   There is currently no information documented on the homunculus. Go to the Rheumatology activity and complete the homunculus joint exam.  Investigation: No additional findings.  Imaging: DG Ribs Unilateral W/Chest Right  Result Date: 07/26/2020 CLINICAL DATA:  Posterior right rib pain since  yesterday. No known injury. EXAM: RIGHT RIBS AND CHEST - 3+ VIEW COMPARISON:  Chest radiographs dated 11/18/2019 FINDINGS: Normal sized heart. Stable healed left anterolateral 6th rib fracture. Interval nodular density at the junction of the left anterior 5th and left posterior 9th ribs. Mild linear scarring at the left lung base without significant change. Otherwise, clear lungs. No right rib fracture or pneumothorax seen.  Cervical and lumbar spine fixation hardware and mild to moderate scoliosis. Mild right shoulder degenerative changes. IMPRESSION: 1. Interval left lower lung zone nodular density. This could represent a lung nodule or interval healing fracture with callus formation. Recommend further evaluation with chest CT. 2. Stable old, healed left 6th anterolateral rib fracture with callus formation. 3. No acute fractures are seen. These results will be called to the ordering clinician or representative by the Radiologist Assistant, and communication documented in the PACS or [employer]. Electronically Signed   By: Claudie Revering M.D.   On: 07/26/2020 17:14   DG Lumbar Spine 2-3 Views  Result Date: 08/03/2020 CLINICAL DATA:  L4-L5 posterior lumbar interbody fusion with posterior instrumentation. EXAM: LUMBAR SPINE - 2-3 VIEW; DG C-ARM 1-60 MIN COMPARISON:  Radiograph Aug 03, 2020, MRI 04/11/2020. FINDINGS: Fluoro time: 21 seconds. Reported radiation: 14.8 mGy. Two C-arm fluoroscopic images were obtained intraoperatively and submitted for post operative interpretation. These images demonstrate transitional lumbosacral anatomy with partial sacralization of L5. This is the seen numbering system used on priors. Bilateral pedicle screws at L4 and L5 with intervening L4-L5 spacer. Additional spacer at L3-L4 and pedicle screws at L1 and L2, partially imaged. Please see the performing provider's procedural report for further detail. IMPRESSION: Intraoperative fluoroscopy, as detailed above. Electronically  Signed   By: Margaretha Sheffield MD   On: 08/03/2020 13:25   CT CHEST WO CONTRAST  Result Date: 08/02/2020 CLINICAL DATA:  Right axillary pain for 1 week. No known injury. History of rib fractures. EXAM: CT CHEST WITHOUT CONTRAST TECHNIQUE: Multidetector CT imaging of the chest was performed following the standard protocol without IV contrast. COMPARISON:  Rib radiographs 07/25/2020.  Chest CTA 11/14/2017. FINDINGS: Cardiovascular: Prominent calcifications of the left anterior descending coronary artery. Otherwise mild coronary artery, aortic and great vessel atherosclerosis without acute vascular findings on noncontrast imaging. The heart size is normal. There is no pericardial effusion. Mediastinum/Nodes: There are no enlarged mediastinal, hilar or axillary lymph nodes.Hilar assessment is limited by the lack of intravenous contrast, although the hilar contours appear unchanged. The thyroid gland, trachea and esophagus demonstrate no significant findings. Lungs/Pleura: No pleural effusion or pneumothorax. Mild subpleural reticulation in both lungs which are otherwise clear. No suspicious pulmonary nodule. Upper abdomen:  Unremarkable. Musculoskeletal/Chest wall: No chest wall mass. Previous cervical and lumbar fusion, incompletely visualized. Progressive ossification of a superiorly migrated disc extrusion on the right at T10-11 which contributes to spinal stenosis and possible right-sided nerve root encroachment. Old fractures of the left 5th and 6th ribs anteriorly are stable. Interval development of several healing right-sided rib fractures near the costovertebral junctions, most notable in the 3rd, 4th, 6, 7th and 10th ribs. Possible acute nondisplaced fracture of the right 9th rib posteriorly (image 83/4). IMPRESSION: 1. Interval development of multiple right-sided rib fractures posteriorly near the costovertebral junctions. Most of these are healing and appear nonacute, although there is a possible acute  nondisplaced fracture of the right 9th rib posteriorly. 2. Progressive ossification of previously demonstrated right paracentral disc extrusion at T10-11 with associated chronic spinal stenosis and possible right-sided nerve root encroachment. 3. No pleural effusion or pneumothorax. 4. Coronary and Aortic Atherosclerosis (ICD10-I70.0). Electronically Signed   By: Richardean Sale M.D.   On: 08/02/2020 14:52   DG Lumbar Spine 1 View  Result Date: 08/03/2020 CLINICAL DATA:  Intraoperative localization for L4-5 fusion EXAM: LUMBAR SPINE - 1 VIEW COMPARISON:  04/11/2020 FINDINGS: Changes of prior fusion are noted at L1-2 with posterior fixation  and at L3-4 with pedicle screw placement although no posterior fixation rod is seen. Surgical instrument is noted posterior to the L4-5 interspace. IMPRESSION: Intraoperative localization at L4-5. Electronically Signed   By: Inez Catalina M.D.   On: 08/03/2020 11:32   DG C-Arm 1-60 Min  Result Date: 08/03/2020 CLINICAL DATA:  L4-L5 posterior lumbar interbody fusion with posterior instrumentation. EXAM: LUMBAR SPINE - 2-3 VIEW; DG C-ARM 1-60 MIN COMPARISON:  Radiograph Aug 03, 2020, MRI 04/11/2020. FINDINGS: Fluoro time: 21 seconds. Reported radiation: 14.8 mGy. Two C-arm fluoroscopic images were obtained intraoperatively and submitted for post operative interpretation. These images demonstrate transitional lumbosacral anatomy with partial sacralization of L5. This is the seen numbering system used on priors. Bilateral pedicle screws at L4 and L5 with intervening L4-L5 spacer. Additional spacer at L3-L4 and pedicle screws at L1 and L2, partially imaged. Please see the performing provider's procedural report for further detail. IMPRESSION: Intraoperative fluoroscopy, as detailed above. Electronically Signed   By: Margaretha Sheffield MD   On: 08/03/2020 13:25    Recent Labs: Lab Results  Component Value Date   WBC 15.0 (H) 08/04/2020   HGB 10.3 (L) 08/04/2020   PLT 210  08/04/2020   NA 133 (L) 08/04/2020   K 4.0 08/04/2020   CL 99 08/04/2020   CO2 22 08/04/2020   GLUCOSE 233 (H) 08/04/2020   BUN 12 08/04/2020   CREATININE 0.97 08/04/2020   BILITOT 0.2 06/08/2020   ALKPHOS 56 02/10/2019   AST 22 06/08/2020   ALT 27 06/08/2020   PROT 6.7 06/08/2020   ALBUMIN 2.7 (L) 02/10/2019   CALCIUM 8.4 (L) 08/04/2020   GFRAA 65 06/08/2020   QFTBGOLD Negative 05/05/2015   QFTBGOLDPLUS NEGATIVE 09/03/2019    Speciality Comments: MTX- high LFTs, PLQ,SSZ,Imuran, Humira- allergy, Enbrel , Remicade - inadequate response  Procedures:  Large Joint Inj: R greater trochanter on 08/23/2020 9:12 AM Indications: pain Details: 27 G 1.5 in needle, lateral approach  Arthrogram: No  Medications: 40 mg triamcinolone acetonide 40 MG/ML; 1.5 mL lidocaine 1 % Aspirate: 0 mL Outcome: tolerated well, no immediate complications Procedure, treatment alternatives, risks and benefits explained, specific risks discussed. Consent was given by the patient. Immediately prior to procedure a time out was called to verify the correct patient, procedure, equipment, support staff and site/side marked as required. Patient was prepped and draped in the usual sterile fashion.     Allergies: Adalimumab, Imuran [azathioprine sodium], Methotrexate, Plaquenil [hydroxychloroquine sulfate], Sulfonamide derivatives, Ceftin [cefuroxime axetil], and Morphine    Assessment / Plan:     Visit Diagnoses: Psoriatic arthritis ([place]) - Severe psoriatic arthritis uncontrolled on current regimen: She continues to have chronic pain and stiffness in multiple joints.  She has tenderness and synovitis over the right third PIP joint.  She has ongoing pain in the right shoulder as well as tenderness over both SI joints.  She has no evidence of Achilles tendinitis or plantar fasciitis on examination today.  No active psoriasis was noted.  She was previously on Cosentyx but had an inadequate response and has not resumed  these injections since undergoing a lumbar fusion performed by Dr. Arnoldo Morale on 08/03/2020.  She was cleared to resume all of her medications after surgery, so she has been taking Otezla 30 mg 1 tablet BID and Arava 20 mg 1 tablet by mouth daily as prescribed.  She was recently prescribed a prednisone taper to try to alleviate the pain she was experiencing in bilateral lower extremities after undergoing surgery.  She did not notice improvement in her leg pain while on prednisone but has noticed less inflammation in her hands.  She was able to make a complete fist on exam today.  She has been cleared to start on Taltz, which has been approved through her insurance and consent has been obtained.  Her last dose of cosentyx was >1 month ago prior to her elective surgery on 08/03/20, so she is eligible to proceed with taltz at this time. Her first dose was administered in the office today.  She will continue on the loading dose: 80mg  on Weeks 2, 4, 6, 8, 10, 12 and then inject 80 mg every 4 weeks thereafter as maintenance.  She will follow up in 2 months to assess her response to combination therapy.   - Plan: Ixekizumab (TALTZ) 80 MG/ML SOAJ  Psoriasis -She has no active psoriasis at this time.  Plan: Ixekizumab (TALTZ) 80 MG/ML SOAJ  High risk medication use - Started on Taltz today in the office. Inadequate response to Cosentyx in the past. She will continue taking  Arava 20mg  1 tablet daily and Otezla 30 mg 1 tablet by mouth twice daily, and prednisone 5 mg 1 tablet daily.  She has been unable to taper prednisone.  CBC and CMP updated on 08/04/20.  TB gold negative 09/03/19.  Future orders placed today for TB gold and other immunosuppressive lab work which will be updated.  - Plan: QuantiFERON-TB Gold Plus, Hepatitis C antibody, Hepatitis B core antibody, IgM, Hepatitis B surface antigen, IgG, IgA, IgM, Ixekizumab (TALTZ) 80 MG/ML SOAJ Advised to hold taltz and arava if she develops signs or symptoms of an infection and  to resume once the infection has completely cleared.   Screening for tuberculosis -Future order placed today. Plan: QuantiFERON-TB Gold Plus  Trochanteric bursitis, right hip - She presents today with significant discomfort in both lower extremities.  She has tenderness to palpation over the right trochanteric bursa. She requested a right trochanteric bursa cortisone injection today.  She tolerated the procedure well.  Aftercare was discussed. Procedure note completed above. Plan: Large Joint Inj: R greater trochanter  Chronic SI joint pain: She has chronic pain in both SI joints.  Tenderness to palpation on exam. She was started on taltz today and will continue on arava, otezla, and prednisone as prescribed   Trochanteric bursitis, left hip: She has ongoing tenderness to palpation.  She had a cortisone injection performed on 06/08/2020 which alleviated her discomfort but her pain has started to return over the past several weeks.  Primary osteoarthritis of right knee: She has good range of motion of the right knee joint on examination today.  No warmth or effusion was noted.  Raynaud's syndrome without gangrene: Not currently active.  DDD (degenerative disc disease), cervical - S/p fusion.  She has limited lateral rotation bilaterally.  She continues to experience chronic pain and stiffness in her neck.  No symptoms of radiculopathy at this time.  DDD (degenerative disc disease), thoracic: No midline spinal tenderness.  DDD (degenerative disc disease), lumbar - S/p fusion performed by Dr. Arnoldo Morale on 08/03/20.  She is currently using a walker to assist with ambulation.  She has an upcoming appointment at Dr. Arnoldo Morale office this week.   Age-related osteoporosis without current pathological fracture - Management by PCP. Recently switched from reclast to prolia due to fragility fractures.  She remains on long term prednisone 5 mg daily.   Chronic pain syndrome -She takes hydrocodone 1 tablet by mouth  2-3 times daily as needed for pain.   Other medical conditions are listed as follows:   Collagenous colitis  Esophageal motility disorder  History of gastroesophageal reflux (GERD)  Senile purpura (HCC)  Adrenal insufficiency (HCC) - Followed by Dr. Chalmers Hall  Essential hypertension  History of type 2 diabetes mellitus - Followed by Dr. Chalmers Hall   Orders: Orders Placed This Encounter  Procedures  . Large Joint Inj: R greater trochanter  . QuantiFERON-TB Gold Plus  . Hepatitis C antibody  . Hepatitis B core antibody, IgM  . Hepatitis B surface antigen  . IgG, IgA, IgM   Meds ordered this encounter  Medications  . Ixekizumab (TALTZ) 80 MG/ML SOAJ    Sig: Inject 160mg  on Week 0, then 80mg  on Weeks 2, 4, 6, 8, 10, 12, then every 4 weeks thereafter.    Dispense:  8 mL    Refill:  0      Follow-Up Instructions: Return in about 2 months (around 10/23/2020) for Psoriatic arthritis, Osteoarthritis, DDD, Osteoporosis.   Ofilia Neas, PA-C  Note - This record has been created using Dragon software.  Chart creation errors have been sought, but may not always  have been located. Such creation errors do not reflect on  the standard of medical care.

## 2020-08-22 NOTE — Telephone Encounter (Signed)
Next Visit: 09/14/2020  Last Visit: 06/08/2020  Last Fill: 05/24/2020  Dx: Psoriatic arthritis   Current Dose per office note on 06/08/2020, not mentioned  Okay to refill Topamax?

## 2020-08-22 NOTE — Telephone Encounter (Signed)
Patient called stating she had surgery approximately 3 weeks ago and is still experiencing bursitis and SI joint pain.  Patient states she is having difficulty walking.  Patient states she had an appointment with Dr. Arnoldo Morale on Friday, 08/19/20 and he is not sure why she is still having difficulty walking.  Patient requested a return call to see if Dr. Estanislado Pandy could give her injections in both bursa to see if that is why she is having difficulty walking.

## 2020-08-23 ENCOUNTER — Ambulatory Visit: Payer: PPO | Admitting: Physician Assistant

## 2020-08-23 ENCOUNTER — Encounter: Payer: Self-pay | Admitting: Physician Assistant

## 2020-08-23 ENCOUNTER — Other Ambulatory Visit: Payer: Self-pay

## 2020-08-23 VITALS — BP 153/70 | HR 102 | Ht <= 58 in | Wt 143.4 lb

## 2020-08-23 DIAGNOSIS — L409 Psoriasis, unspecified: Secondary | ICD-10-CM

## 2020-08-23 DIAGNOSIS — L4050 Arthropathic psoriasis, unspecified: Secondary | ICD-10-CM | POA: Diagnosis not present

## 2020-08-23 DIAGNOSIS — M533 Sacrococcygeal disorders, not elsewhere classified: Secondary | ICD-10-CM | POA: Diagnosis not present

## 2020-08-23 DIAGNOSIS — M810 Age-related osteoporosis without current pathological fracture: Secondary | ICD-10-CM

## 2020-08-23 DIAGNOSIS — I10 Essential (primary) hypertension: Secondary | ICD-10-CM

## 2020-08-23 DIAGNOSIS — M5136 Other intervertebral disc degeneration, lumbar region: Secondary | ICD-10-CM

## 2020-08-23 DIAGNOSIS — Z111 Encounter for screening for respiratory tuberculosis: Secondary | ICD-10-CM

## 2020-08-23 DIAGNOSIS — Z79899 Other long term (current) drug therapy: Secondary | ICD-10-CM | POA: Diagnosis not present

## 2020-08-23 DIAGNOSIS — G894 Chronic pain syndrome: Secondary | ICD-10-CM

## 2020-08-23 DIAGNOSIS — M5030 Other cervical disc degeneration, unspecified cervical region: Secondary | ICD-10-CM

## 2020-08-23 DIAGNOSIS — M7062 Trochanteric bursitis, left hip: Secondary | ICD-10-CM

## 2020-08-23 DIAGNOSIS — G8929 Other chronic pain: Secondary | ICD-10-CM

## 2020-08-23 DIAGNOSIS — Z8719 Personal history of other diseases of the digestive system: Secondary | ICD-10-CM

## 2020-08-23 DIAGNOSIS — I7300 Raynaud's syndrome without gangrene: Secondary | ICD-10-CM

## 2020-08-23 DIAGNOSIS — Z8639 Personal history of other endocrine, nutritional and metabolic disease: Secondary | ICD-10-CM

## 2020-08-23 DIAGNOSIS — M5134 Other intervertebral disc degeneration, thoracic region: Secondary | ICD-10-CM | POA: Diagnosis not present

## 2020-08-23 DIAGNOSIS — K224 Dyskinesia of esophagus: Secondary | ICD-10-CM

## 2020-08-23 DIAGNOSIS — K52831 Collagenous colitis: Secondary | ICD-10-CM

## 2020-08-23 DIAGNOSIS — M1711 Unilateral primary osteoarthritis, right knee: Secondary | ICD-10-CM

## 2020-08-23 DIAGNOSIS — M7061 Trochanteric bursitis, right hip: Secondary | ICD-10-CM | POA: Diagnosis not present

## 2020-08-23 DIAGNOSIS — D692 Other nonthrombocytopenic purpura: Secondary | ICD-10-CM

## 2020-08-23 DIAGNOSIS — E2740 Unspecified adrenocortical insufficiency: Secondary | ICD-10-CM

## 2020-08-23 MED ORDER — LIDOCAINE HCL 1 % IJ SOLN
1.5000 mL | INTRAMUSCULAR | Status: AC | PRN
  Administered 2020-08-23: 1.5 mL

## 2020-08-23 MED ORDER — TALTZ 80 MG/ML ~~LOC~~ SOAJ: SUBCUTANEOUS | 0 refills | Status: DC

## 2020-08-23 MED ORDER — TRIAMCINOLONE ACETONIDE 40 MG/ML IJ SUSP
40.0000 mg | INTRAMUSCULAR | Status: AC | PRN
  Administered 2020-08-23: 40 mg via INTRA_ARTICULAR

## 2020-08-23 NOTE — Patient Instructions (Addendum)
Next Donnetta Hail is due: 6/7 - 6/21 - 7/5 - 7/19 - 8/2 - 8/16 - then every 4 weeks (9/13 - 10/11 - 11/8 - 12/6)  Phone number to schedule shipment: [phone]  Standing Labs We placed an order today for your standing lab work.   Please have your standing labs drawn in 1 month then every 3 months routinely  If possible, please have your labs drawn 2 weeks prior to your appointment so that the provider can discuss your results at your appointment.  Please note that you may see your imaging and lab results in [facility] before we have reviewed them. We may be awaiting multiple results to interpret others before contacting you. Please allow our office up to 72 hours to thoroughly review all of the results before contacting the office for clarification of your results.  We have open lab daily: Monday through Thursday from 1:30-4:30 PM and Friday from 1:30-4:00 PM at the office of Dr. Bo Merino, [facility].   Please be advised, all patients with office appointments requiring lab work will take precedent over walk-in lab work.  If possible, please come for your lab work on Monday and Friday afternoons, as you may experience shorter wait times. The office is located at [address], [place], [place], [place] [address] No appointment is necessary.   Labs are drawn by [facility]. Please bring your co-pay at the time of your lab draw.  You may receive a bill from [facility] for your lab work.  If you wish to have your labs drawn at another location, please call the office 24 hours in advance to send orders.  If you have any questions regarding directions or hours of operation,  please call [phone].   As a reminder, please drink plenty of water prior to coming for your lab work. Thanks!

## 2020-08-23 NOTE — Progress Notes (Signed)
Pharmacy Note  Subjective:   Patient presents to clinic today to receive hip injections. She had elective lumbar fusion on 08/03/20 via Dr. Arnoldo Morale and she states she is cleared to resume. She has restarted [place]. To prevent an additional visit, she will receive first dose of Taltz for psoriatic arthritis and psoriasis overlap today. She stopped Cosentyx prior to her surgery so has been off of Cosentyx > 1 month and it is appropriate for her to initiate Taltz. She also takes [place] 30mg  daily and leflunomide 20mg  daily.  Patient running a fever or have signs/symptoms of infection? No  Patient currently on antibiotics for the treatment of infection? No  - she is finishing cephalexin course today that was prescribed by Dr. Arnoldo Morale though she does not actively have infection  Patient have any upcoming invasive procedures/surgeries? No  Objective: CMP     Component Value Date/Time   NA 133 (L) 08/04/2020 0338   K 4.0 08/04/2020 0338   CL 99 08/04/2020 0338   CO2 22 08/04/2020 0338   GLUCOSE 233 (H) 08/04/2020 0338   BUN 12 08/04/2020 0338   CREATININE 0.97 08/04/2020 0338   CREATININE 1.01 (H) 06/08/2020 1425   CALCIUM 8.4 (L) 08/04/2020 0338   PROT 6.7 06/08/2020 1425   ALBUMIN 2.7 (L) 02/10/2019 0501   AST 22 06/08/2020 1425   ALT 27 06/08/2020 1425   ALKPHOS 56 02/10/2019 0501   BILITOT 0.2 06/08/2020 1425   GFRNONAA >60 08/04/2020 0338   GFRNONAA 56 (L) 06/08/2020 1425   GFRAA 65 06/08/2020 1425    CBC    Component Value Date/Time   WBC 15.0 (H) 08/04/2020 0338   RBC 3.23 (L) 08/04/2020 0338   HGB 10.3 (L) 08/04/2020 0338   HCT 31.1 (L) 08/04/2020 0338   PLT 210 08/04/2020 0338   MCV 96.3 08/04/2020 0338   MCH 31.9 08/04/2020 0338   MCHC 33.1 08/04/2020 0338   RDW 14.3 08/04/2020 0338   LYMPHSABS 1,694 06/08/2020 1425   MONOABS 0.4 02/10/2019 0501   EOSABS 336 06/08/2020 1425   BASOSABS 139 06/08/2020 1425    Baseline Immunosuppressant Therapy Labs TB  GOLD Quantiferon TB Gold Latest Ref Rng & Units 09/03/2019  Quantiferon TB Gold Plus NEGATIVE NEGATIVE   Hepatitis Panel   HIV Lab Results  Component Value Date   HIV NON REACTIVE 02/06/2019   Immunoglobulins   SPEP Serum Protein Electrophoresis Latest Ref Rng & Units 06/08/2020  Total Protein 6.1 - 8.1 g/dL 6.7   Chest x-ray: 08/02/20 - coronary and aortic atherosclerosis evident on CT chest; no pleural effusion or pneumothorax  Assessment/Plan:  Demonstrated proper injection technique with Taltz demo device  Patient able to demonstrate proper injection technique using the teach back method.  Patient self injected in the right and left lower abdomen with:  Sample Medication: Taltz 80mg /mL auto-injector x 2 pens Lot: [id number] Expiration: 05/2021  Patient tolerated well.  Observed for 30 mins in office for adverse reaction and none noted.   Patient is to return in 1 month for labs and 2 months for follow-up appointment.  Standing orders placed. Lab orders placed for immunoglobulin as well as hepatitis panel as we were unable to find these in Epic or SRS.  Taltz approved through patient assistance.  Prescription already sent with patient assistance application. She was provided with phone number for [facility] to schedule shipment of medication to her home today or tomorrow. Future prescriptions for Taltz to be sent to [facility]  Taltz dose is 160 mg as two divided injections on Week 0 (received today in clinic 08/23/20), then 80mg  on Weeks 2, 4, 6, 8, 10, 12.  Then inject 80 mg every 4 weeks thereafter as maintenance.   All questions encouraged and answered.  Instructed patient to call with any further questions or concerns.  Knox Saliva, PharmD, MPH Clinical Pharmacist (Rheumatology and Pulmonology)  08/23/2020 9:09 AM

## 2020-08-30 ENCOUNTER — Telehealth: Payer: Self-pay

## 2020-08-30 NOTE — Telephone Encounter (Signed)
Patient called stating she had an appointment with Lovena Le last week and is still experiencing SI joint pain in both hips and needs cortisone injections.  Patient requested a return call.

## 2020-08-30 NOTE — Telephone Encounter (Signed)
Patient had back surgery 08/03/2020, patient requesting bil SI joint inj. Patient states her surgeon, Dr. Arnoldo Morale, thinks her her SI joints are hurting due to the way the patient is walking. Please advise.

## 2020-08-30 NOTE — Telephone Encounter (Signed)
Ok to schedule appointment for SI joint injection if her blood glucose level and BP are normal.

## 2020-08-31 ENCOUNTER — Ambulatory Visit: Payer: PPO | Admitting: Rheumatology

## 2020-08-31 ENCOUNTER — Other Ambulatory Visit: Payer: Self-pay

## 2020-08-31 VITALS — BP 137/75 | HR 101

## 2020-08-31 DIAGNOSIS — E2749 Other adrenocortical insufficiency: Secondary | ICD-10-CM | POA: Diagnosis not present

## 2020-08-31 DIAGNOSIS — E559 Vitamin D deficiency, unspecified: Secondary | ICD-10-CM | POA: Diagnosis not present

## 2020-08-31 DIAGNOSIS — M533 Sacrococcygeal disorders, not elsewhere classified: Secondary | ICD-10-CM | POA: Diagnosis not present

## 2020-08-31 DIAGNOSIS — S2239XA Fracture of one rib, unspecified side, initial encounter for closed fracture: Secondary | ICD-10-CM | POA: Diagnosis not present

## 2020-08-31 DIAGNOSIS — G8929 Other chronic pain: Secondary | ICD-10-CM

## 2020-08-31 DIAGNOSIS — E119 Type 2 diabetes mellitus without complications: Secondary | ICD-10-CM | POA: Diagnosis not present

## 2020-08-31 DIAGNOSIS — I10 Essential (primary) hypertension: Secondary | ICD-10-CM | POA: Diagnosis not present

## 2020-08-31 DIAGNOSIS — M810 Age-related osteoporosis without current pathological fracture: Secondary | ICD-10-CM | POA: Diagnosis not present

## 2020-08-31 DIAGNOSIS — E7800 Pure hypercholesterolemia, unspecified: Secondary | ICD-10-CM | POA: Diagnosis not present

## 2020-08-31 MED ORDER — LIDOCAINE HCL 1 % IJ SOLN
1.5000 mL | INTRAMUSCULAR | Status: AC | PRN
  Administered 2020-08-31: 1.5 mL

## 2020-08-31 MED ORDER — TRIAMCINOLONE ACETONIDE 40 MG/ML IJ SUSP
40.0000 mg | INTRAMUSCULAR | Status: AC | PRN
  Administered 2020-08-31: 40 mg via INTRA_ARTICULAR

## 2020-08-31 NOTE — Progress Notes (Signed)
   Procedure Note  Patient: Tonya Hall             Date of Birth: [birth year]           MRN: [id number]             Visit Date: 08/31/2020  Procedures: Visit Diagnoses:  1. Chronic SI joint pain   Patient was seen by Hazel Sams, PA-C recently and and has been experiencing pain and discomfort in her bilateral SI joints.  She requested cortisone injection to her SI joints.  Her blood pressure has been normal.  She was also evaluated by Dr. Michiel Sites and was given a sliding scale in case her blood glucose levels go up.  Per her request bilateral SI joints were injected today as described below.  She also stated that she has been having some rash at the injection site.  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  It appears that she has some contact dermatitis from the [place].  Have advised her not to use Band-Aid after the Taltz injection.  Sacroiliac Joint Inj on 08/31/2020 10:53 AM Indications: pain Details: 27 G 1.5 in needle, posterior approach Medications (Right): 1.5 mL lidocaine 1 %; 40 mg triamcinolone acetonide 40 MG/ML Medications (Left): 1.5 mL lidocaine 1 %; 40 mg triamcinolone acetonide 40 MG/ML Outcome: tolerated well, no immediate complications Procedure, treatment alternatives, risks and benefits explained, specific risks discussed. Consent was given by the patient. Immediately prior to procedure a time out was called to verify the correct patient, procedure, equipment, support staff and site/side marked as required. Patient was prepped and draped in the usual sterile fashion.    Bo Merino, MD

## 2020-08-31 NOTE — Telephone Encounter (Signed)
I called patient, appt scheduled 08/31/2020

## 2020-09-08 ENCOUNTER — Telehealth: Payer: Self-pay

## 2020-09-08 NOTE — Telephone Encounter (Signed)
Injection site reactions are common with taltz.  The new formulation of taltz is now available.  Please check that the patient has the citrate free pen.    She can try taking zyrtec 1-2 prior to her injections, day of injection, and 1 day after the injection.    Please advise the patient to notify us if the injection site reactions worsen or if she develops any other signs of a reaction she should be emergently evaluated.

## 2020-09-08 NOTE — Telephone Encounter (Signed)
Patient left a voicemail stating she started the Taltz in the office 2 weeks ago.  Patient states she broke out in a rash on her abdomin and Dr. Estanislado Pandy thought it was from the bandaid and not the medication.  Patient states she took her first does by herself this past Wednesday and 24 hours later noticed a red spot approximately 2 cm around the injection site that is raised, red, and sore.  Patient states she has used ice and cortisone cream.  Patient requested a return call to let her know if it is okay to continue taking the medication.

## 2020-09-09 DIAGNOSIS — M48061 Spinal stenosis, lumbar region without neurogenic claudication: Secondary | ICD-10-CM | POA: Diagnosis not present

## 2020-09-09 NOTE — Telephone Encounter (Signed)
Patient advised Injection site reactions are common with taltz.  The new formulation of taltz is now available.  Patient advised to see if she has the citrate free pen.  Patient will check and contact the office to advise.    Patient advised she can try taking zyrtec 1-2 prior to her injections, day of injection, and 1 day after the injection.     Patient advised to notify us if the injection site reactions worsen or if she develops any other signs of a reaction she should be emergently evaluated.  Patient expressed understanding.

## 2020-09-13 DIAGNOSIS — S2239XA Fracture of one rib, unspecified side, initial encounter for closed fracture: Secondary | ICD-10-CM | POA: Diagnosis not present

## 2020-09-14 ENCOUNTER — Other Ambulatory Visit (HOSPITAL_COMMUNITY): Payer: Self-pay | Admitting: Neurosurgery

## 2020-09-14 ENCOUNTER — Ambulatory Visit: Payer: PPO | Admitting: Physician Assistant

## 2020-09-14 ENCOUNTER — Other Ambulatory Visit: Payer: Self-pay | Admitting: Neurosurgery

## 2020-09-14 DIAGNOSIS — M5416 Radiculopathy, lumbar region: Secondary | ICD-10-CM

## 2020-09-15 ENCOUNTER — Other Ambulatory Visit: Payer: Self-pay | Admitting: Rheumatology

## 2020-09-16 ENCOUNTER — Other Ambulatory Visit: Payer: Self-pay

## 2020-09-16 NOTE — Telephone Encounter (Signed)
Patient called and requested a refill of prednisone 5mg  daily. Patient has enough medication for another week, patient is aware medication refill will be sent on 09/19/2020.  Next Visit: 10/25/2020  Last Visit: 08/23/2020  Last Fill: 06/09/2020  DX: Psoriatic arthritis  Current Dose per office note on 08/23/2020: prednisone 5 mg 1 tablet daily.   (Patient was prescribed a prednisone taper on 08/09/2020 but has now resumed maintenance dose of prednisone)    Okay to refill prednisone?

## 2020-09-19 MED ORDER — PREDNISONE 5 MG PO TABS: 5.0000 mg | ORAL_TABLET | Freq: Every day | ORAL | 0 refills | Status: DC

## 2020-09-28 ENCOUNTER — Ambulatory Visit (HOSPITAL_COMMUNITY)
Admission: RE | Admit: 2020-09-28 | Discharge: 2020-09-28 | Disposition: A | Discharge status: Home / Self Care | Payer: PPO | Source: Ambulatory Visit | Attending: Neurosurgery | Admitting: Neurosurgery

## 2020-09-28 ENCOUNTER — Other Ambulatory Visit: Payer: Self-pay

## 2020-09-28 DIAGNOSIS — M5416 Radiculopathy, lumbar region: Secondary | ICD-10-CM | POA: Diagnosis not present

## 2020-09-28 DIAGNOSIS — M5117 Intervertebral disc disorders with radiculopathy, lumbosacral region: Secondary | ICD-10-CM | POA: Diagnosis not present

## 2020-09-28 DIAGNOSIS — M5115 Intervertebral disc disorders with radiculopathy, thoracolumbar region: Secondary | ICD-10-CM | POA: Diagnosis not present

## 2020-09-28 DIAGNOSIS — M4726 Other spondylosis with radiculopathy, lumbar region: Secondary | ICD-10-CM | POA: Diagnosis not present

## 2020-09-28 DIAGNOSIS — M5116 Intervertebral disc disorders with radiculopathy, lumbar region: Secondary | ICD-10-CM | POA: Diagnosis not present

## 2020-09-28 MED ORDER — GADOBUTROL 1 MMOL/ML IV SOLN
5.0000 mL | Freq: Once | INTRAVENOUS | Status: AC | PRN
  Administered 2020-09-28: 5 mL via INTRAVENOUS

## 2020-10-17 NOTE — Progress Notes (Deleted)
Office Visit Note  Patient: Tonya Hall             Date of Birth: October 05, 1950           MRN: [id number]             PCP: [name], MD Referring: [name], MD Visit Date: 10/25/2020 Occupation: @GUAROCC @  Subjective:  No chief complaint on file.   History of Present Illness: Tonya Hall is a 70 y.o. female ***   Activities of Daily Living:  Patient reports morning stiffness for *** {minute/hour:19697}.   Patient {ACTIONS;DENIES/REPORTS:21021675::"Denies"} nocturnal pain.  Difficulty dressing/grooming: {ACTIONS;DENIES/REPORTS:21021675::"Denies"} Difficulty climbing stairs: {ACTIONS;DENIES/REPORTS:21021675::"Denies"} Difficulty getting out of chair: {ACTIONS;DENIES/REPORTS:21021675::"Denies"} Difficulty using hands for taps, buttons, cutlery, and/or writing: {ACTIONS;DENIES/REPORTS:21021675::"Denies"}  No Rheumatology ROS completed.   PMFS History:  Patient Active Problem List   Diagnosis Date Noted   Foraminal stenosis of lumbar region 08/03/2020   Cervical spondylosis with myelopathy and radiculopathy 03/09/2020   AKI (acute kidney injury) ([facility]) 02/07/2019   Acute respiratory failure with hypoxia (HCC) 02/06/2019   Acute respiratory disease due to COVID-19 virus 02/06/2019   Community acquired pneumonia 02/06/2019   Hyponatremia 02/06/2019   Asthma    Diabetes mellitus without complication ([place])    Closed nondisplaced odontoid fracture with type II morphology ([facility]) 01/27/2018   Odontoid fracture ([place]) 01/24/2018   Chest pain 11/14/2017   Transaminitis 06/22/2016   Collagenous colitis 05/23/2016   Psoriasis 05/21/2016   High risk medication use 02/15/2016   Age-related osteoporosis without current pathological fracture 02/15/2016   Trochanteric bursitis, left hip 02/15/2016   Sacroiliitis, not elsewhere classified ([place]) 02/15/2016   Chronic pain syndrome 02/15/2016   Abnormal weight gain 06/25/2012   Hypertension 07/03/2011   Hypercholesteremia  07/03/2011   Psoriatic arthritis ([place]) 07/03/2011   Osteoarthritis of multiple joints 07/03/2011   Esophageal motility disorder 02/14/2011   Cough 02/14/2011   GERD 05/05/2010    Past Medical History:  Diagnosis Date   Asthma    Albuterol in haler prn   Cataract    immature unsure which eye   Chronic back pain    COVID-19 October/November 2020   Degenerative disk disease    psoriatic   Diabetes mellitus without complication (HCC)    diet controlled   Esophageal motility disorder    Non-specific, see modified barium study/speech path, BP   Fibromyalgia    GERD (gastroesophageal reflux disease)    takes Protonix bid   Headache 2019   History of blood transfusion    post c-section   History of bronchitis    HTN (hypertension)    Hyperlipidemia    takes Pravastatin daily   Lymphocytic colitis 05/26/2010   Responded to Entocort x 3 MOS   Neuropathy    Nonallergic rhinitis    Osteoporosis    gets Boniva every 3 months   Peripheral edema    takes HCTZ daily   Pneumonia 02/2019   Psoriatic arthritis ([place])    Dr. Katherina Right   Psoriatic arthritis [facility])    bilateral hands   Raynaud's disease    Seasonal allergies    Shingles 2020   SUI (stress urinary incontinence, female)    Urinary urgency     Family History  Problem Relation Age of Onset   Diabetes Mother    Pancreatitis Mother    Arthritis Mother        psoriatic    Fibromyalgia Mother    Rheumatic fever Father    Diabetes Other  grandparents   Skin cancer Other        grandfather   Hypertension Other        grandparent   Congestive Heart Failure Other        grandfather   Parkinson's disease Other        grandmother   Colon cancer Maternal Uncle    Fibromyalgia Sister    Rheum arthritis Sister    Diabetes Sister    Fibromyalgia Sister    Diabetes Sister    Rheum arthritis Sister    Hypertension Son    Colon polyps Neg Hx    Past Surgical History:  Procedure Laterality Date   ANTERIOR  CERVICAL DECOMP/DISCECTOMY FUSION N/A 03/09/2020   Procedure: ANTERIOR CERVICAL DECOMPRESSION/DISCECTOMY FUSION, INTERBODY PROSTHESIS, PLATE SCREWS CERVICAL FIVE-SIX, CERVICAL SIX-SEVEN;  Surgeon: Newman Pies, MD;  Location: [facility];  Service: Neurosurgery;  Laterality: N/A;   BACK SURGERY  2003   BIOPSY  04/17/2016   Procedure: BIOPSY;  Surgeon: Danie Binder, MD;  [facility];  Service: Endoscopy;;  random colon bx's   CERVICAL SPINE SURGERY  09/09/2017   CESAREAN SECTION  1974, 1978       COLONOSCOPY  06/19/2008   GQQ:PYPPJ internal hemorrhoids/mild sigmoin colon diverticulosis   COLONOSCOPY  2012   Dr. Gala Romney: normal rectum, diverticula, lymphocytic colitis    COLONOSCOPY WITH PROPOFOL N/A 04/17/2016   Dr. Oneida Alar: Normal terminal ileum, internal/external hemorrhoids, random colon biopsies consistent with collagenous colitis   DILATION AND CURETTAGE OF UTERUS  1977   spontaneous abortion   ESOPHAGOGASTRODUODENOSCOPY  06/19/2008   KDT:OIZTIWPY gastritis   LUMBAR DISC ARTHROPLASTY  7/98   LUMBAR FUSION  6/03, 11/08, 11/14   L4-5 fusion, L2-3 fusion, L1-2   LUMBAR FUSION  08/03/2020   L4-L5   LUMBAR LAMINECTOMY  03/2020   ODONTOID SCREW INSERTION N/A 01/27/2018   Procedure: ODONTOID SCREW INSERTION;  Surgeon: Newman Pies, MD;  Location: [facility];  Service: Neurosurgery;  Laterality: N/A;  ODONTOID SCREW INSERTION   TONSILLECTOMY     TONSILLECTOMY AND ADENOIDECTOMY     TUBAL LIGATION     Social History   Social History Narrative   ATTENDS [employer]. 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   Immunization History  Administered Date(s) Administered   Influenza,inj,Quad PF,6+ Mos 01/21/2017   Influenza-Unspecified 01/30/2017   Moderna Sars-Covid-2 Vaccination 06/01/2019, 07/02/2019   Pneumococcal Polysaccharide-23 02/19/2013   Tdap 09/10/2016     Objective: Vital Signs: LMP 02/01/1999    Physical Exam   Musculoskeletal Exam: ***  CDAI Exam: CDAI Score:  -- Patient Global: --; Provider Global: -- Swollen: --; Tender: -- Joint Exam 10/25/2020   No joint exam has been documented for this visit   There is currently no information documented on the homunculus. Go to the Rheumatology activity and complete the homunculus joint exam.  Investigation: No additional findings.  Imaging: MR Lumbar Spine W Wo Contrast  Result Date: 09/28/2020 CLINICAL DATA:  Lumbar radiculopathy; technologist note states severe pain since surgery Aug 01, 2020, right sciatic pain EXAM: MRI LUMBAR SPINE WITHOUT AND WITH CONTRAST TECHNIQUE: Multiplanar and multiecho pulse sequences of the lumbar spine were obtained without and with intravenous contrast. CONTRAST:  83mL GADAVIST GADOBUTROL 1 MMOL/ML IV SOLN COMPARISON:  04/11/2020 FINDINGS: Segmentation: Vertebral body numbering is kept consistent with the prior study. Alignment:  Stable retrolisthesis at L2-L3. Vertebrae: Postoperative changes of prior fusion identified. There are rods and pedicle screws at L1-L2 as well as L3-L4 with intervening L3-L4 interbody  spacer. Appears to be interval extension of the inferior fusion to include L5 with new L4-L5 interbody spacer. Interval laminectomies at L4-L5. Prior laminectomies from L1-L2 to L3-L4. There is susceptibility artifact partially obscuring L4, but appears to be a fracture cleft through the posterior vertebral body and superior endplate. Conus medullaris and cauda equina: Conus extends to the L1 level. Conus and cauda equina appear normal. Paraspinal and other soft tissues: Small postoperative fluid collection. Disc levels: T10-T11: Partially imaged disc bulge and probable extrusion extending above disc level. Facet arthropathy with ligamentum flavum infolding. Mild to moderate canal stenosis. T11-T12: Small left paracentral disc protrusion. No canal or foraminal stenosis. T12-L1: Small central disc protrusion. Facet arthropathy. No canal or foraminal stenosis. L1-L2: Disc bulge  with central/left paracentral disc extrusion extending slightly above disc level. No canal or foraminal stenosis. L2-L3: Disc bulge with endplate osteophytic ridging. No canal or foraminal stenosis. L3-L4: Probable bridging bone across the disc space posteriorly. No canal or foraminal stenosis. L4-L5: Interval decompression of the canal and right subarticular recess. There is postoperative enhancement extending along the right lateral epidural space to the subarticular recess and foramen likely reflecting granulation/fibrotic tissue. Facet arthropathy with ligamentum flavum thickening foramina are distorted by artifact. There may be persistent stenosis on the right. L5-S1: Left foraminal disc protrusion with endplate osteophytic ridging. No canal or right foraminal stenosis. Moderate left foraminal stenosis. IMPRESSION: Postoperative and degenerative changes as detailed above. Interval postoperative changes at L4-L5 with decompression of the spinal canal and right subarticular recess. There is postoperative granulation/fibrotic tissue within the right subarticular recess and foramen with possible persistent right foraminal stenosis. Additionally, there is apparent fracture of L4. CT may be helpful as there is artifact from hardware through this area. These results will be called to the ordering clinician or representative by the Radiologist Assistant, and communication documented in the PACS or [employer]. Electronically Signed   By: Macy Mis M.D.   On: 09/28/2020 15:02    Recent Labs: Lab Results  Component Value Date   WBC 15.0 (H) 08/04/2020   HGB 10.3 (L) 08/04/2020   PLT 210 08/04/2020   NA 133 (L) 08/04/2020   K 4.0 08/04/2020   CL 99 08/04/2020   CO2 22 08/04/2020   GLUCOSE 233 (H) 08/04/2020   BUN 12 08/04/2020   CREATININE 0.97 08/04/2020   BILITOT 0.2 06/08/2020   ALKPHOS 56 02/10/2019   AST 22 06/08/2020   ALT 27 06/08/2020   PROT 6.7 06/08/2020   ALBUMIN 2.7 (L) 02/10/2019    CALCIUM 8.4 (L) 08/04/2020   GFRAA 65 06/08/2020   QFTBGOLD Negative 05/05/2015   QFTBGOLDPLUS NEGATIVE 09/03/2019    Speciality Comments: MTX- high LFTs, PLQ,SSZ,Imuran, Humira- allergy, Enbrel , Remicade - inadequate response  Procedures:  No procedures performed Allergies: Adalimumab, Imuran [azathioprine sodium], Methotrexate, Plaquenil [hydroxychloroquine sulfate], Sulfonamide derivatives, Ceftin [cefuroxime axetil], and Morphine   Assessment / Plan:     Visit Diagnoses: No diagnosis found.  Orders: No orders of the defined types were placed in this encounter.  No orders of the defined types were placed in this encounter.   Face-to-face time spent with patient was *** minutes. Greater than 50% of time was spent in counseling and coordination of care.  Follow-Up Instructions: No follow-ups on file.   Earnestine Mealing, CMA  Note - This record has been created using Editor, commissioning.  Chart creation errors have been sought, but may not always  have been located. Such creation errors do not  reflect on  the standard of medical care.

## 2020-10-18 NOTE — Progress Notes (Signed)
Office Visit Note  Patient: Tonya Hall             Date of Birth: [birth year]           MRN: [id number]             PCP: [name], MD Referring: [name], MD Visit Date: 10/19/2020 Occupation: @GUAROCC @  Subjective:  Right thumb pain.   History of Present Illness: Tonya Hall is a 70 y.o. female history of psoriatic arthritis, psoriasis, osteoarthritis and degenerative disc disease.  She states she had lumbar spine fusion on Aug 03, 2020.  After she came home she developed severe pain.  She was diagnosed with L4 fracture.  She did not get vertebroplasty or kyphoplasty.  Gradually the symptoms improved.  She is currently taking Percocet prescribed by Dr. Arnoldo Morale.  She likes the current combination of Julieanne Cotton and prednisone.  She had COVID-19 infection a month ago.  She states she was not very symptomatic.  She was treated with Paxlovid and recovered without any problems.  She has been using walker since her surgery.  She has been putting extra pressure on her hands.  She states since yesterday she is having difficulty bending her right thumb.  Activities of Daily Living:  Patient reports morning stiffness for all day.  Patient Reports nocturnal pain.  Difficulty dressing/grooming: Reports Difficulty climbing stairs: Reports Difficulty getting out of chair: Reports Difficulty using hands for taps, buttons, cutlery, and/or writing: Reports  Review of Systems  Constitutional:  Positive for fatigue.  HENT:  Positive for mouth dryness and nose dryness. Negative for mouth sores.   Eyes:  Positive for dryness. Negative for pain and itching.  Respiratory:  Negative for shortness of breath and difficulty breathing.   Cardiovascular:  Negative for chest pain and palpitations.  Gastrointestinal:  Negative for blood in stool, constipation and diarrhea.  Endocrine: Negative for increased urination.  Genitourinary:  Negative for difficulty urinating.  Musculoskeletal:   Positive for joint pain, joint pain, joint swelling and morning stiffness. Negative for myalgias, muscle tenderness and myalgias.  Skin:  Negative for color change, rash and redness.  Allergic/Immunologic: Negative for susceptible to infections.  Neurological:  Positive for numbness and weakness. Negative for dizziness, headaches and memory loss.  Hematological:  Positive for bruising/bleeding tendency.  Psychiatric/Behavioral:  Negative for confusion.    PMFS History:  Patient Active Problem List   Diagnosis Date Noted   Foraminal stenosis of lumbar region 08/03/2020   Cervical spondylosis with myelopathy and radiculopathy 03/09/2020   AKI (acute kidney injury) ([place]) 02/07/2019   Acute respiratory failure with hypoxia ([place]) 02/06/2019   Acute respiratory disease due to COVID-19 virus 02/06/2019   Community acquired pneumonia 02/06/2019   Hyponatremia 02/06/2019   Asthma    Diabetes mellitus without complication ([place])    Closed nondisplaced odontoid fracture with type II morphology ([place]) 01/27/2018   Odontoid fracture ([place]) 01/24/2018   Chest pain 11/14/2017   Transaminitis 06/22/2016   Collagenous colitis 05/23/2016   Psoriasis 05/21/2016   High risk medication use 02/15/2016   Age-related osteoporosis without current pathological fracture 02/15/2016   Trochanteric bursitis, left hip 02/15/2016   Sacroiliitis, not elsewhere classified ([place]) 02/15/2016   Chronic pain syndrome 02/15/2016   Abnormal weight gain 06/25/2012   Hypertension 07/03/2011   Hypercholesteremia 07/03/2011   Psoriatic arthritis ([place]) 07/03/2011   Osteoarthritis of multiple joints 07/03/2011   Esophageal motility disorder 02/14/2011   Cough 02/14/2011   GERD 05/05/2010  Past Medical History:  Diagnosis Date   Asthma    Albuterol in haler prn   Cataract    immature unsure which eye   Chronic back pain    COVID-19 October/November 2020   Degenerative disk disease    psoriatic   Diabetes mellitus  without complication (HCC)    diet controlled   Esophageal motility disorder    Non-specific, see modified barium study/speech path, BP   Fibromyalgia    GERD (gastroesophageal reflux disease)    takes Protonix bid   Headache 2019   History of blood transfusion    post c-section   History of bronchitis    HTN (hypertension)    Hyperlipidemia    takes Pravastatin daily   Lymphocytic colitis 05/26/2010   Responded to Entocort x 3 MOS   Neuropathy    Nonallergic rhinitis    Osteoporosis    gets Boniva every 3 months   Peripheral edema    takes HCTZ daily   Pneumonia 02/2019   Psoriatic arthritis ([place])    Dr. Katherina Right   Psoriatic arthritis ([place])    bilateral hands   Raynaud's disease    Seasonal allergies    Shingles 2020   SUI (stress urinary incontinence, female)    Urinary urgency     Family History  Problem Relation Age of Onset   Diabetes Mother    Pancreatitis Mother    Arthritis Mother        psoriatic    Fibromyalgia Mother    Rheumatic fever Father    Diabetes Other        grandparents   Skin cancer Other        grandfather   Hypertension Other        grandparent   Congestive Heart Failure Other        grandfather   Parkinson's disease Other        grandmother   Colon cancer Maternal Uncle    Fibromyalgia Sister    Rheum arthritis Sister    Diabetes Sister    Fibromyalgia Sister    Diabetes Sister    Rheum arthritis Sister    Hypertension Son    Colon polyps Neg Hx    Past Surgical History:  Procedure Laterality Date   ANTERIOR CERVICAL DECOMP/DISCECTOMY FUSION N/A 03/09/2020   Procedure: ANTERIOR CERVICAL DECOMPRESSION/DISCECTOMY FUSION, INTERBODY PROSTHESIS, PLATE SCREWS CERVICAL FIVE-SIX, CERVICAL SIX-SEVEN;  Surgeon: Newman Pies, MD;  Location: [facility];  Service: Neurosurgery;  Laterality: N/A;   BACK SURGERY  2003   BIOPSY  04/17/2016   Procedure: BIOPSY;  Surgeon: Danie Binder, MD;  [facility];  Service: Endoscopy;;   random colon bx's   CERVICAL SPINE SURGERY  09/09/2017   CESAREAN SECTION  1974, 1978       COLONOSCOPY  06/19/2008   ZOX:WRUEA internal hemorrhoids/mild sigmoin colon diverticulosis   COLONOSCOPY  2012   Dr. Gala Romney: normal rectum, diverticula, lymphocytic colitis    COLONOSCOPY WITH PROPOFOL N/A 04/17/2016   Dr. Oneida Alar: Normal terminal ileum, internal/external hemorrhoids, random colon biopsies consistent with collagenous colitis   DILATION AND CURETTAGE OF UTERUS  1977   spontaneous abortion   ESOPHAGOGASTRODUODENOSCOPY  06/19/2008   VWU:[id number] gastritis   LUMBAR DISC ARTHROPLASTY  7/98   LUMBAR FUSION  6/03, 11/08, 11/14   L4-5 fusion, L2-3 fusion, L1-2   LUMBAR FUSION  08/03/2020   L4-L5   LUMBAR LAMINECTOMY  03/2020   ODONTOID SCREW INSERTION N/A 01/27/2018   Procedure: ODONTOID SCREW  INSERTION;  Surgeon: Newman Pies, MD;  Location: [facility];  Service: Neurosurgery;  Laterality: N/A;  ODONTOID SCREW INSERTION   TONSILLECTOMY     TONSILLECTOMY AND ADENOIDECTOMY     TUBAL LIGATION     Social History   Social History Narrative   ATTENDS [employer]. 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   Immunization History  Administered Date(s) Administered   Influenza,inj,Quad PF,6+ Mos 01/21/2017   Influenza-Unspecified 01/30/2017   Moderna Sars-Covid-2 Vaccination 06/01/2019, 07/02/2019   Pneumococcal Polysaccharide-23 02/19/2013   Tdap 09/10/2016     Objective: Vital Signs: BP (!) 152/89 (BP Location: Left Arm, Patient Position: Sitting, Cuff Size: Normal)   Pulse 93   Ht 4\' 11"  (1.499 m)   Wt 135 lb 6.4 oz (61.4 kg)   LMP 02/01/1999   BMI 27.35 kg/m    Physical Exam Vitals and nursing note reviewed.  Constitutional:      Appearance: She is well-developed.  HENT:     Head: Normocephalic and atraumatic.  Eyes:     Conjunctiva/sclera: Conjunctivae normal.  Cardiovascular:     Rate and Rhythm: Normal rate and regular rhythm.     Heart sounds: Normal heart sounds.   Pulmonary:     Effort: Pulmonary effort is normal.     Breath sounds: Normal breath sounds.  Abdominal:     General: Bowel sounds are normal.     Palpations: Abdomen is soft.  Musculoskeletal:     Cervical back: Normal range of motion.  Lymphadenopathy:     Cervical: No cervical adenopathy.  Skin:    General: Skin is warm and dry.     Capillary Refill: Capillary refill takes less than 2 seconds.  Neurological:     Mental Status: She is alert and oriented to person, place, and time.  Psychiatric:        Behavior: Behavior normal.     Musculoskeletal Exam: She had limited range of motion of cervical spine with lateral rotation.  She has thoracic kyphosis.  Shoulder joints with good range of motion.  Elbow joints normal range of motion.  No synovitis was noted over MCPs or wrist joints.  She had bilateral PIP and DIP thickening.  Right first MCP synovitis was noted.  Hip joints were difficult to assess in the sitting position.  She had tenderness over bilateral SI joints and trochanteric bursa.  Knee joints and ankle joints with good range of motion.  She had no tenderness over ankles or MTPs.  CDAI Exam: CDAI Score: -- Patient Global: --; Provider Global: -- Swollen: --; Tender: -- Joint Exam 10/19/2020   No joint exam has been documented for this visit   There is currently no information documented on the homunculus. Go to the Rheumatology activity and complete the homunculus joint exam.  Investigation: No additional findings.  Imaging: MR Lumbar Spine W Wo Contrast  Result Date: 09/28/2020 CLINICAL DATA:  Lumbar radiculopathy; technologist note states severe pain since surgery Aug 01, 2020, right sciatic pain EXAM: MRI LUMBAR SPINE WITHOUT AND WITH CONTRAST TECHNIQUE: Multiplanar and multiecho pulse sequences of the lumbar spine were obtained without and with intravenous contrast. CONTRAST:  6mL GADAVIST GADOBUTROL 1 MMOL/ML IV SOLN COMPARISON:  04/11/2020 FINDINGS:  Segmentation: Vertebral body numbering is kept consistent with the prior study. Alignment:  Stable retrolisthesis at L2-L3. Vertebrae: Postoperative changes of prior fusion identified. There are rods and pedicle screws at L1-L2 as well as L3-L4 with intervening L3-L4 interbody spacer. Appears to be interval extension of the inferior fusion to  include L5 with new L4-L5 interbody spacer. Interval laminectomies at L4-L5. Prior laminectomies from L1-L2 to L3-L4. There is susceptibility artifact partially obscuring L4, but appears to be a fracture cleft through the posterior vertebral body and superior endplate. Conus medullaris and cauda equina: Conus extends to the L1 level. Conus and cauda equina appear normal. Paraspinal and other soft tissues: Small postoperative fluid collection. Disc levels: T10-T11: Partially imaged disc bulge and probable extrusion extending above disc level. Facet arthropathy with ligamentum flavum infolding. Mild to moderate canal stenosis. T11-T12: Small left paracentral disc protrusion. No canal or foraminal stenosis. T12-L1: Small central disc protrusion. Facet arthropathy. No canal or foraminal stenosis. L1-L2: Disc bulge with central/left paracentral disc extrusion extending slightly above disc level. No canal or foraminal stenosis. L2-L3: Disc bulge with endplate osteophytic ridging. No canal or foraminal stenosis. L3-L4: Probable bridging bone across the disc space posteriorly. No canal or foraminal stenosis. L4-L5: Interval decompression of the canal and right subarticular recess. There is postoperative enhancement extending along the right lateral epidural space to the subarticular recess and foramen likely reflecting granulation/fibrotic tissue. Facet arthropathy with ligamentum flavum thickening foramina are distorted by artifact. There may be persistent stenosis on the right. L5-S1: Left foraminal disc protrusion with endplate osteophytic ridging. No canal or right foraminal  stenosis. Moderate left foraminal stenosis. IMPRESSION: Postoperative and degenerative changes as detailed above. Interval postoperative changes at L4-L5 with decompression of the spinal canal and right subarticular recess. There is postoperative granulation/fibrotic tissue within the right subarticular recess and foramen with possible persistent right foraminal stenosis. Additionally, there is apparent fracture of L4. CT may be helpful as there is artifact from hardware through this area. These results will be called to the ordering clinician or representative by the Radiologist Assistant, and communication documented in the PACS or [employer]. Electronically Signed   By: Macy Mis M.D.   On: 09/28/2020 15:02   US Guided Needle Placement  Result Date: 10/19/2020 Ultrasound guided injection is preferred based studies that show increased duration, increased effect, greater accuracy, decreased procedural pain, increased response rate, and decreased cost with ultrasound guided versus blind injection.   Verbal informed consent obtained.  Time-out conducted.  Noted no overlying erythema, induration, or other signs of local infection. Ultrasound-guided right first MCP injection: After sterile prep with Betadine, injected 0.5 mL of lidocaine and 10 mg Kenalog using a 27-gauge needle, needle placement was confirmed in the right first MCP joint and injected.     Recent Labs: Lab Results  Component Value Date   WBC 15.0 (H) 08/04/2020   HGB 10.3 (L) 08/04/2020   PLT 210 08/04/2020   NA 133 (L) 08/04/2020   K 4.0 08/04/2020   CL 99 08/04/2020   CO2 22 08/04/2020   GLUCOSE 233 (H) 08/04/2020   BUN 12 08/04/2020   CREATININE 0.97 08/04/2020   BILITOT 0.2 06/08/2020   ALKPHOS 56 02/10/2019   AST 22 06/08/2020   ALT 27 06/08/2020   PROT 6.7 06/08/2020   ALBUMIN 2.7 (L) 02/10/2019   CALCIUM 8.4 (L) 08/04/2020   GFRAA 65 06/08/2020   QFTBGOLD Negative 05/05/2015   QFTBGOLDPLUS NEGATIVE  09/03/2019    Speciality Comments: MTX- high LFTs, PLQ,SSZ,Imuran, Humira- allergy, Enbrel , Remicade - inadequate response  Procedures:  Ultrasound guided injection is preferred based studies that show increased duration, increased effect, greater accuracy, decreased procedural pain, increased response rate, and decreased cost with ultrasound guided versus blind injection.   Verbal informed consent obtained.  Time-out conducted.  Noted no overlying erythema, induration, or other signs of local infection. Ultrasound-guided right first MCP injection: After sterile prep with Betadine, injected 0.5 mL of lidocaine and 10 mg Kenalog using a 27-gauge needle, needle placement was confirmed in the right first MCP joint and injected.   Small Joint Inj: R thumb MCP on 10/19/2020 4:31 PM Indications: pain and joint swelling Details: 27 G needle, ultrasound-guided dorsal approach  Spinal Needle: No  Medications: 0.5 mL lidocaine 1 %; 10 mg triamcinolone acetonide 40 MG/ML Aspirate: 0 mL Outcome: tolerated well, no immediate complications Procedure, treatment alternatives, risks and benefits explained, specific risks discussed. Consent was given by the patient. Immediately prior to procedure a time out was called to verify the correct patient, procedure, equipment, support staff and site/side marked as required. Patient was prepped and draped in the usual sterile fashion.    Allergies: Adalimumab, Imuran [azathioprine sodium], Methotrexate, Plaquenil [hydroxychloroquine sulfate], Sulfonamide derivatives, Ceftin [cefuroxime axetil], and Morphine   Assessment / Plan:     Visit Diagnoses: Psoriatic arthritis (HCC)-she has done well on the current combination of medications.  She has been using a walker since her lumbar spine surgery.  She developed pain and swelling in her right thumb yesterday.  She states the symptoms have been getting worse.  She is having difficulty moving her right thumb.  On examination  she had synovitis of the right first MCP joint.  Different treatment options and side effects were discussed.  She requested a cortisone injection.  After informed consent was obtained under ultrasound guidance the right first MCP joint was injected as described above.  She tolerated the procedure well.  A prescription for right thumb spica brace was given.  She was instructed to use the brace only when she is doing activities.  Psoriasis-she had no active lesions.  High risk medication use - Taltz 80 mg subcu q 4 weeks, Arava 20mg  1 tablet daily and Otezla 30 mg 1 tablet by mouth twice daily, and prednisone 5 mg 1 tablet daily. - Plan: CBC with Differential/Platelet, COMPLETE METABOLIC PANEL WITH GFR, QuantiFERON-TB Gold Plus today.  She has been advised to get labs every 3 months.  She was advised to stop immunosuppressive agents except prednisone if she develops COVID-19 infection or any other infection.  She may resume medications once the infection resolves.  Instructions regarding vaccination recommendations were also placed in the AVS.  She does not want to get COVID-19 vaccine.  Thumb pain, right -she had to synovitis in her right first MCP joint.  Most likely related to the use of walker.  Different treatment options were discussed.  Injection was performed as described above.  Plan: US Guided Needle Placement  Trochanteric bursitis of both hips-she continues to have some discomfort.  I did mention stretches were discussed.  Chronic left SI joint pain-she continues to have discomfort off and on.  Primary osteoarthritis of right knee-doing better currently.  DDD (degenerative disc disease), cervical-she had limited range of motion.  DDD (degenerative disc disease), thoracic-she has thoracic kyphosis and chronic pain.  DDD (degenerative disc disease), lumbar - S/p fusion performed by Dr. Arnoldo Morale on 08/03/20.  She is currently on Percocet which is helping her with the pain  management.  Raynaud's syndrome without gangrene-currently not active.  Age-related osteoporosis without current pathological fracture - Management by PCP. Recently switched from reclast to prolia due to fragility fractures.She remains on long term prednisone 5 mg daily.  She developed L4 fracture after the lumbar spine surgery.  Chronic pain syndrome - He is on Percocet for pain management.  History of gastroesophageal reflux (GERD)  Collagenous colitis  Esophageal motility disorder  Essential hypertension  Adrenal insufficiency (HCC) - Followed by Dr. Chalmers Cater  History of type 2 diabetes mellitus - Followed by Dr. Chalmers Cater.  I advised her to monitor blood glucose closely today after the cortisone injection.  Senile purpura ([place])  Orders: Orders Placed This Encounter  Procedures   Small Joint Inj   US Guided Needle Placement   CBC with Differential/Platelet   COMPLETE METABOLIC PANEL WITH GFR   QuantiFERON-TB Gold Plus   No orders of the defined types were placed in this encounter.    Follow-Up Instructions: Return in about 3 months (around 01/19/2021) for Psoriatic arthritis.   Bo Merino, MD  Note - This record has been created using Editor, commissioning.  Chart creation errors have been sought, but may not always  have been located. Such creation errors do not reflect on  the standard of medical care.

## 2020-10-19 ENCOUNTER — Ambulatory Visit: Payer: Self-pay

## 2020-10-19 ENCOUNTER — Other Ambulatory Visit: Payer: Self-pay

## 2020-10-19 ENCOUNTER — Encounter: Payer: Self-pay | Admitting: Rheumatology

## 2020-10-19 ENCOUNTER — Ambulatory Visit: Payer: PPO | Admitting: Rheumatology

## 2020-10-19 VITALS — BP 152/89 | HR 93 | Ht 59.0 in | Wt 135.4 lb

## 2020-10-19 DIAGNOSIS — M7062 Trochanteric bursitis, left hip: Secondary | ICD-10-CM

## 2020-10-19 DIAGNOSIS — M5030 Other cervical disc degeneration, unspecified cervical region: Secondary | ICD-10-CM | POA: Diagnosis not present

## 2020-10-19 DIAGNOSIS — M51369 Other intervertebral disc degeneration, lumbar region without mention of lumbar back pain or lower extremity pain: Secondary | ICD-10-CM

## 2020-10-19 DIAGNOSIS — Z8639 Personal history of other endocrine, nutritional and metabolic disease: Secondary | ICD-10-CM

## 2020-10-19 DIAGNOSIS — Z79899 Other long term (current) drug therapy: Secondary | ICD-10-CM

## 2020-10-19 DIAGNOSIS — I10 Essential (primary) hypertension: Secondary | ICD-10-CM

## 2020-10-19 DIAGNOSIS — M533 Sacrococcygeal disorders, not elsewhere classified: Secondary | ICD-10-CM | POA: Diagnosis not present

## 2020-10-19 DIAGNOSIS — M810 Age-related osteoporosis without current pathological fracture: Secondary | ICD-10-CM

## 2020-10-19 DIAGNOSIS — M5134 Other intervertebral disc degeneration, thoracic region: Secondary | ICD-10-CM | POA: Diagnosis not present

## 2020-10-19 DIAGNOSIS — M79644 Pain in right finger(s): Secondary | ICD-10-CM

## 2020-10-19 DIAGNOSIS — M5136 Other intervertebral disc degeneration, lumbar region: Secondary | ICD-10-CM | POA: Diagnosis not present

## 2020-10-19 DIAGNOSIS — M7061 Trochanteric bursitis, right hip: Secondary | ICD-10-CM

## 2020-10-19 DIAGNOSIS — L409 Psoriasis, unspecified: Secondary | ICD-10-CM

## 2020-10-19 DIAGNOSIS — Z8719 Personal history of other diseases of the digestive system: Secondary | ICD-10-CM

## 2020-10-19 DIAGNOSIS — E2740 Unspecified adrenocortical insufficiency: Secondary | ICD-10-CM

## 2020-10-19 DIAGNOSIS — G8929 Other chronic pain: Secondary | ICD-10-CM

## 2020-10-19 DIAGNOSIS — I7300 Raynaud's syndrome without gangrene: Secondary | ICD-10-CM | POA: Diagnosis not present

## 2020-10-19 DIAGNOSIS — G894 Chronic pain syndrome: Secondary | ICD-10-CM

## 2020-10-19 DIAGNOSIS — L4050 Arthropathic psoriasis, unspecified: Secondary | ICD-10-CM | POA: Diagnosis not present

## 2020-10-19 DIAGNOSIS — M1711 Unilateral primary osteoarthritis, right knee: Secondary | ICD-10-CM | POA: Diagnosis not present

## 2020-10-19 DIAGNOSIS — K52831 Collagenous colitis: Secondary | ICD-10-CM

## 2020-10-19 DIAGNOSIS — D692 Other nonthrombocytopenic purpura: Secondary | ICD-10-CM

## 2020-10-19 DIAGNOSIS — K224 Dyskinesia of esophagus: Secondary | ICD-10-CM

## 2020-10-19 MED ORDER — LIDOCAINE HCL 1 % IJ SOLN
0.5000 mL | INTRAMUSCULAR | Status: AC | PRN
  Administered 2020-10-19: .5 mL

## 2020-10-19 MED ORDER — TRIAMCINOLONE ACETONIDE 40 MG/ML IJ SUSP
10.0000 mg | INTRAMUSCULAR | Status: AC | PRN
  Administered 2020-10-19: 10 mg via INTRA_ARTICULAR

## 2020-10-19 NOTE — Patient Instructions (Addendum)
Standing Labs We placed an order today for your standing lab work.   Please have your standing labs drawn in October and every 3 months  If possible, please have your labs drawn 2 weeks prior to your appointment so that the provider can discuss your results at your appointment.  Please note that you may see your imaging and lab results in [facility] before we have reviewed them. We may be awaiting multiple results to interpret others before contacting you. Please allow our office up to 72 hours to thoroughly review all of the results before contacting the office for clarification of your results.  We have open lab daily: Monday through Thursday from 1:30-4:30 PM and Friday from 1:30-4:00 PM at the office of Dr. Bo Merino, [facility].   Please be advised, all patients with office appointments requiring lab work will take precedent over walk-in lab work.  If possible, please come for your lab work on Monday and Friday afternoons, as you may experience shorter wait times. The office is located at [address], [place], [place], [place] [address] No appointment is necessary.   Labs are drawn by [facility]. Please bring your co-pay at the time of your lab draw.  You may receive a bill from [facility] for your lab work.  If you wish to have your labs drawn at another location, please call the office 24 hours in advance to send orders.  If you have any questions regarding directions or hours of operation,  please call [phone].   As a reminder, please drink plenty of water prior to coming for your lab work. Thanks!   If you test POSITIVE for COVID19 and have MILD to MODERATE symptoms: First, call your PCP if you would like to receive COVID19 treatment AND Hold your medications during the infection and for at least 1 week after your symptoms have resolved: Injectable medication (Benlysta, Cimzia, Cosentyx, Enbrel, Humira, Orencia, Remicade, Simponi, Stelara, Taltz,  Tremfya) Methotrexate Leflunomide (Arava) Mycophenolate (Cellcept) Morrie Sheldon, Olumiant, or Rinvoq If you take Actemra or Kevzara, you DO NOT need to hold these for COVID19 infection.  If you test POSITIVE for COVID19 and have NO symptoms: First, call your PCP if you would like to receive COVID19 treatment AND Hold your medications for at least 10 days after the day that you tested positive Injectable medication (Benlysta, Cimzia, Cosentyx, Enbrel, Humira, Orencia, Remicade, Simponi, Stelara, Taltz, Tremfya) Methotrexate Leflunomide (Arava) Mycophenolate (Cellcept) Morrie Sheldon, Olumiant, or Rinvoq If you take Actemra or Kevzara, you DO NOT need to hold these for COVID19 infection.  If you have signs or symptoms of an infection or start antibiotics: First, call your PCP for workup of your infection. Hold your medication through the infection, until you complete your antibiotics, and until symptoms resolve if you take the following: Injectable medication (Actemra, Benlysta, Cimzia, Cosentyx, Enbrel, Humira, Kevzara, Orencia, Remicade, Simponi, Stelara, Taltz, Tremfya) Methotrexate Leflunomide (Arava) Mycophenolate (Cellcept) Morrie Sheldon, Olumiant, or Rinvoq  Vaccines You are taking a medication(s) that can suppress your immune system.  The following immunizations are recommended: Flu annually Covid-19  Td/Tdap (tetanus, diphtheria, pertussis) every 10 years Pneumonia (Prevnar 15 then Pneumovax 23 at least 1 year apart.  Alternatively, can take Prevnar 20 without needing additional dose) Shingrix (after age 79): 2 doses from 4 weeks to 6 months apart  Please check with your PCP to make sure you are up to date.  Heart Disease Prevention   Your inflammatory disease increases your risk of heart disease which includes heart  attack, stroke, atrial fibrillation (irregular heartbeats), high blood pressure, heart failure and atherosclerosis (plaque in the arteries).  It is important to reduce your risk  by:   Keep blood pressure, cholesterol, and blood sugar at healthy levels   Smoking Cessation   Maintain a healthy weight  BMI 20-25   Eat a healthy diet  Plenty of fresh fruit, vegetables, and whole grains  Limit saturated fats, foods high in sodium, and added sugars  DASH and Mediterranean diet   Increase physical activity  Recommend moderate physically activity for 150 minutes per week/ 30 minutes a day for five days a week These can be broken up into three separate ten-minute sessions during the day.   Reduce Stress  Meditation, slow breathing exercises, yoga, coloring books  Dental visits twice a year

## 2020-10-20 ENCOUNTER — Other Ambulatory Visit: Payer: Self-pay | Admitting: Rheumatology

## 2020-10-20 DIAGNOSIS — L409 Psoriasis, unspecified: Secondary | ICD-10-CM

## 2020-10-20 DIAGNOSIS — L4050 Arthropathic psoriasis, unspecified: Secondary | ICD-10-CM

## 2020-10-20 DIAGNOSIS — Z79899 Other long term (current) drug therapy: Secondary | ICD-10-CM

## 2020-10-20 NOTE — Telephone Encounter (Signed)
Next Visit: 01/18/2021  Last Visit: 10/19/2020  Last Fill: 08/23/2020  SA:[id number] arthritis  Current Dose per office note 10/19/2020: Taltz 80 mg subcu q 4 weeks  Labs: 10/19/2020 Sodium 130, ALT 35, WBC 13.5, RBC 3.78, Hgb 11.6, Neutro Abs: 11,232  TB Gold: 09/03/2019 (updated 10/19/2020, results pending.)   Okay to refill Taltz?

## 2020-10-22 LAB — CBC WITH DIFFERENTIAL/PLATELET
Absolute Monocytes: 756 cells/uL (ref 200–950)
Basophils Absolute: 68 cells/uL (ref 0–200)
Basophils Relative: 0.5 %
Eosinophils Absolute: 54 cells/uL (ref 15–500)
Eosinophils Relative: 0.4 %
HCT: 35.2 % (ref 35.0–45.0)
Hemoglobin: 11.6 g/dL — ABNORMAL LOW (ref 11.7–15.5)
Lymphs Abs: 1391 cells/uL (ref 850–3900)
MCH: 30.7 pg (ref 27.0–33.0)
MCHC: 33 g/dL (ref 32.0–36.0)
MCV: 93.1 fL (ref 80.0–100.0)
MPV: 10.6 fL (ref 7.5–12.5)
Monocytes Relative: 5.6 %
Neutro Abs: 11232 cells/uL — ABNORMAL HIGH (ref 1500–7800)
Neutrophils Relative %: 83.2 %
Platelets: 316 10*3/uL (ref 140–400)
RBC: 3.78 10*6/uL — ABNORMAL LOW (ref 3.80–5.10)
RDW: 13.5 % (ref 11.0–15.0)
Total Lymphocyte: 10.3 %
WBC: 13.5 10*3/uL — ABNORMAL HIGH (ref 3.8–10.8)

## 2020-10-22 LAB — COMPLETE METABOLIC PANEL WITH GFR
AG Ratio: 1.8 (calc) (ref 1.0–2.5)
ALT: 35 U/L — ABNORMAL HIGH (ref 6–29)
AST: 21 U/L (ref 10–35)
Albumin: 4.2 g/dL (ref 3.6–5.1)
Alkaline phosphatase (APISO): 60 U/L (ref 37–153)
BUN: 25 mg/dL (ref 7–25)
CO2: 23 mmol/L (ref 20–32)
Calcium: 9.5 mg/dL (ref 8.6–10.4)
Chloride: 100 mmol/L (ref 98–110)
Creat: 0.68 mg/dL (ref 0.60–1.00)
Globulin: 2.4 g/dL (calc) (ref 1.9–3.7)
Glucose, Bld: 119 mg/dL — ABNORMAL HIGH (ref 65–99)
Potassium: 4.4 mmol/L (ref 3.5–5.3)
Sodium: 130 mmol/L — ABNORMAL LOW (ref 135–146)
Total Bilirubin: 0.3 mg/dL (ref 0.2–1.2)
Total Protein: 6.6 g/dL (ref 6.1–8.1)
eGFR: 94 mL/min/{1.73_m2} (ref >=60)

## 2020-10-22 LAB — QUANTIFERON-TB GOLD PLUS
Mitogen-NIL: 0.73 IU/mL
NIL: 0.05 IU/mL
QuantiFERON-TB Gold Plus: NEGATIVE
TB1-NIL: 0 IU/mL
TB2-NIL: <0 IU/mL

## 2020-10-23 NOTE — Progress Notes (Signed)
TB Gold is negative.

## 2020-10-24 ENCOUNTER — Telehealth: Payer: Self-pay

## 2020-10-24 NOTE — Telephone Encounter (Signed)
She is on oxycodone, prednisone, gabapentin, taltz, arava, and otezla. Please offer the patient a referral to pain management.  She can also schedule an appointment with her neurosurgeon, Dr. Arnoldo Morale, to make sure her discomfort is not radiating from the lumbar spine s/p fusion in May 2022.

## 2020-10-24 NOTE — Telephone Encounter (Signed)
Patient advised she received bilateral joint injections on 08/31/2020. Patient advised that she is not eligible until after September 2022. Patient would like to know what else she may do for her SI joint pain. Please advise.

## 2020-10-24 NOTE — Telephone Encounter (Signed)
Patient called stating she is experiencing right SI joint pain which began on Friday, 10/21/20.  Patient states she has had a lot of injections in the past 3 months and can't remember the last time she had one in her right SI joint.  Patient requested a return call to let her know if she is eligible.

## 2020-10-24 NOTE — Telephone Encounter (Signed)
She is on oxycodone, prednisone, gabapentin, taltz, arava, and otezla. Offered patient a referral to pain management. Patient declined referral to pain management. Patient advised   she can also schedule an appointment with her neurosurgeon, Dr. Arnoldo Morale, to make sure her discomfort is not radiating from the lumbar spine s/p fusion in May 2022. Patient states she has an upcoming appointment with Dr. Arnoldo Morale soon.

## 2020-10-25 ENCOUNTER — Ambulatory Visit (HOSPITAL_BASED_OUTPATIENT_CLINIC_OR_DEPARTMENT_OTHER): Payer: PPO | Admitting: Obstetrics & Gynecology

## 2020-10-25 ENCOUNTER — Ambulatory Visit: Payer: PPO | Admitting: Physician Assistant

## 2020-10-25 DIAGNOSIS — Z8639 Personal history of other endocrine, nutritional and metabolic disease: Secondary | ICD-10-CM

## 2020-10-25 DIAGNOSIS — G8929 Other chronic pain: Secondary | ICD-10-CM

## 2020-10-25 DIAGNOSIS — M7061 Trochanteric bursitis, right hip: Secondary | ICD-10-CM

## 2020-10-25 DIAGNOSIS — K224 Dyskinesia of esophagus: Secondary | ICD-10-CM

## 2020-10-25 DIAGNOSIS — Z8719 Personal history of other diseases of the digestive system: Secondary | ICD-10-CM

## 2020-10-25 DIAGNOSIS — M5136 Other intervertebral disc degeneration, lumbar region: Secondary | ICD-10-CM

## 2020-10-25 DIAGNOSIS — D692 Other nonthrombocytopenic purpura: Secondary | ICD-10-CM

## 2020-10-25 DIAGNOSIS — Z79899 Other long term (current) drug therapy: Secondary | ICD-10-CM

## 2020-10-25 DIAGNOSIS — M5134 Other intervertebral disc degeneration, thoracic region: Secondary | ICD-10-CM

## 2020-10-25 DIAGNOSIS — M810 Age-related osteoporosis without current pathological fracture: Secondary | ICD-10-CM

## 2020-10-25 DIAGNOSIS — M7062 Trochanteric bursitis, left hip: Secondary | ICD-10-CM

## 2020-10-25 DIAGNOSIS — L409 Psoriasis, unspecified: Secondary | ICD-10-CM

## 2020-10-25 DIAGNOSIS — M1711 Unilateral primary osteoarthritis, right knee: Secondary | ICD-10-CM

## 2020-10-25 DIAGNOSIS — I10 Essential (primary) hypertension: Secondary | ICD-10-CM

## 2020-10-25 DIAGNOSIS — K52831 Collagenous colitis: Secondary | ICD-10-CM

## 2020-10-25 DIAGNOSIS — G894 Chronic pain syndrome: Secondary | ICD-10-CM

## 2020-10-25 DIAGNOSIS — L4050 Arthropathic psoriasis, unspecified: Secondary | ICD-10-CM

## 2020-10-25 DIAGNOSIS — E2740 Unspecified adrenocortical insufficiency: Secondary | ICD-10-CM

## 2020-10-25 DIAGNOSIS — M5030 Other cervical disc degeneration, unspecified cervical region: Secondary | ICD-10-CM

## 2020-10-25 DIAGNOSIS — I7300 Raynaud's syndrome without gangrene: Secondary | ICD-10-CM

## 2020-10-26 ENCOUNTER — Other Ambulatory Visit: Payer: Self-pay | Admitting: *Deleted

## 2020-10-26 DIAGNOSIS — L409 Psoriasis, unspecified: Secondary | ICD-10-CM

## 2020-10-26 MED ORDER — OTEZLA 30 MG PO TABS: 30.0000 mg | ORAL_TABLET | Freq: Two times a day (BID) | ORAL | 0 refills | Status: DC

## 2020-10-26 NOTE — Telephone Encounter (Signed)
Next Visit: 01/18/2021  Last Visit: 10/19/2020  Last Fill: 07/21/2020  DX:  Psoriatic arthritis  Current Dose per office note 10/19/2020: [name] 30 mg 1 tablet by mouth twice daily,  Labs: 10/19/2020, CBC and CMP stable. We will continue to monitor lab work closely.  Please advise the patient to avoid taking tylenol  Okay to refill [name]?

## 2020-10-27 ENCOUNTER — Encounter: Payer: Self-pay | Admitting: Internal Medicine

## 2020-11-08 ENCOUNTER — Other Ambulatory Visit: Payer: Self-pay | Admitting: Rheumatology

## 2020-11-08 NOTE — Telephone Encounter (Signed)
Next Visit: 01/18/2021   Last Visit: 10/19/2020   Last Fill: 08/15/2020  DX:  Psoriatic arthritis   Current Dose per office note 10/19/2020: Tonya Hall '20mg'$  1 tablet daily  Labs: 10/19/2020, CBC and CMP stable. We will continue to monitor lab work closely.  Please advise the patient to avoid taking tylenol   Okay to refill Arava?

## 2020-11-16 ENCOUNTER — Other Ambulatory Visit: Payer: Self-pay | Admitting: Gastroenterology

## 2020-11-16 DIAGNOSIS — K219 Gastro-esophageal reflux disease without esophagitis: Secondary | ICD-10-CM

## 2020-11-16 DIAGNOSIS — R197 Diarrhea, unspecified: Secondary | ICD-10-CM

## 2020-11-25 ENCOUNTER — Ambulatory Visit (HOSPITAL_COMMUNITY): Payer: PPO | Attending: Student | Admitting: Physical Therapy

## 2020-11-25 ENCOUNTER — Encounter (HOSPITAL_COMMUNITY): Payer: Self-pay | Admitting: Physical Therapy

## 2020-11-25 ENCOUNTER — Other Ambulatory Visit: Payer: Self-pay

## 2020-11-25 ENCOUNTER — Other Ambulatory Visit: Payer: Self-pay | Admitting: Rheumatology

## 2020-11-25 DIAGNOSIS — M545 Low back pain, unspecified: Secondary | ICD-10-CM | POA: Insufficient documentation

## 2020-11-25 DIAGNOSIS — R2689 Other abnormalities of gait and mobility: Secondary | ICD-10-CM

## 2020-11-25 DIAGNOSIS — R29898 Other symptoms and signs involving the musculoskeletal system: Secondary | ICD-10-CM | POA: Insufficient documentation

## 2020-11-25 DIAGNOSIS — M6281 Muscle weakness (generalized): Secondary | ICD-10-CM | POA: Insufficient documentation

## 2020-11-25 NOTE — Therapy (Signed)
[place], [place], [address] Phone: [phone]   Fax:  [phone]  Physical Therapy Evaluation  Patient Details  Name: Tonya Hall MRN: [id number] Date of Birth: [birth year] Referring Provider (PT): Patricia Nettle NP   Encounter Date: 11/25/2020   PT End of Session - 11/25/20 0923     Visit Number 1    Number of Visits 8    Date for PT Re-Evaluation 01/06/21    Authorization Type [employer] - visits based on med nec. no auth    PT Start Time [phone]   late to checkin   PT Stop Time 0954    PT Time Calculation (min) 31 min    Activity Tolerance Patient limited by pain    Behavior During Therapy [facility] for tasks assessed/performed             Past Medical History:  Diagnosis Date   Asthma    Albuterol in haler prn   Cataract    immature unsure which eye   Chronic back pain    COVID-19 October/November 2020   Degenerative disk disease    psoriatic   Diabetes mellitus without complication (HCC)    diet controlled   Esophageal motility disorder    Non-specific, see modified barium study/speech path, BP   Fibromyalgia    GERD (gastroesophageal reflux disease)    takes Protonix bid   Headache 2019   History of blood transfusion    post c-section   History of bronchitis    HTN (hypertension)    Hyperlipidemia    takes Pravastatin daily   Lymphocytic colitis 05/26/2010   Responded to Entocort x 3 MOS   Neuropathy    Nonallergic rhinitis    Osteoporosis    gets Boniva every 3 months   Peripheral edema    takes HCTZ daily   Pneumonia 02/2019   Psoriatic arthritis ([place])    Dr. Katherina Right   Psoriatic arthritis [facility])    bilateral hands   Raynaud's disease    Seasonal allergies    Shingles 2020   SUI (stress urinary incontinence, female)    Urinary urgency     Past Surgical History:  Procedure Laterality Date   ANTERIOR CERVICAL DECOMP/DISCECTOMY FUSION N/A 03/09/2020    Procedure: ANTERIOR CERVICAL DECOMPRESSION/DISCECTOMY FUSION, INTERBODY PROSTHESIS, PLATE SCREWS CERVICAL FIVE-SIX, CERVICAL SIX-SEVEN;  Surgeon: Newman Pies, MD;  Location: [facility];  Service: Neurosurgery;  Laterality: N/A;   BACK SURGERY  2003   BIOPSY  04/17/2016   Procedure: BIOPSY;  Surgeon: Danie Binder, MD;  [facility];  Service: Endoscopy;;  random colon bx's   CERVICAL SPINE SURGERY  09/09/2017   CESAREAN SECTION  1974, 1978       COLONOSCOPY  06/19/2008   [id number] internal hemorrhoids/mild sigmoin colon diverticulosis   COLONOSCOPY  2012   Dr. Gala Romney: normal rectum, diverticula, lymphocytic colitis    COLONOSCOPY WITH PROPOFOL N/A 04/17/2016   Dr. Oneida Alar: Normal terminal ileum, internal/external hemorrhoids, random colon biopsies consistent with collagenous colitis   DILATION AND CURETTAGE OF UTERUS  1977   spontaneous abortion   ESOPHAGOGASTRODUODENOSCOPY  06/19/2008   [id number] gastritis   LUMBAR DISC ARTHROPLASTY  7/98   LUMBAR FUSION  6/03, 11/08, 11/14   L4-5 fusion, L2-3 fusion, L1-2   LUMBAR FUSION  08/03/2020   L4-L5   LUMBAR LAMINECTOMY  03/2020   ODONTOID SCREW INSERTION N/A 01/27/2018   Procedure: ODONTOID SCREW INSERTION;  Surgeon: Newman Pies,  MD;  Location: [facility];  Service: Neurosurgery;  Laterality: N/A;  ODONTOID SCREW INSERTION   TONSILLECTOMY     TONSILLECTOMY AND ADENOIDECTOMY     TUBAL LIGATION      There were no vitals filed for this visit.    Subjective Assessment - 11/25/20 0930     Subjective States that she is better since her lumbar surgery. States that she was ok for the first couple days and then had severe pain a few days following her most lumbar surgery on 08/03/20 where she couldn't walk. Reports that they discovered that they fractured her L4 vertebrae 3 months after. States that walking was her most painful. States that finally in the last month her pain has subsided  but she can't walk because she wobbles. States  that she is using her rollator to move around now as she fatigues quickly using the cane. Prior to her most recent surgery she was not using any assistive device and would like to get back to not needing her assistive device. Current pain is 0/10 and in the last 24 hours she had 9/10 pain after standing and walking at the [facility] open.    Currently in Pain? Yes   in the last 24 hrs   Pain Score 9     Pain Location Back    Pain Orientation Right;Left;Lower    Pain Descriptors / Indicators Aching;Dull;Sharp    Pain Type Chronic pain    Pain Radiating Towards down both legs    Pain Onset More than a month ago    Pain Frequency Intermittent    Aggravating Factors  walking, standing.    Pain Relieving Factors rest, medication,                [facility] PT Assessment - 11/25/20 0001       Assessment   Medical Diagnosis LBP    Referring Provider (PT) Patricia Nettle NP    Onset Date/Surgical Date 08/03/20    Prior Therapy yes for low back      Balance Screen   Has the patient fallen in the past 6 months Yes    How many times? 2    Has the patient had a decrease in activity level because of a fear of falling?  No    Is the patient reluctant to leave their home because of a fear of falling?  No      Home Social worker Private residence    Living Arrangements Spouse/significant other    Available Help at Discharge Family    Type of [place] to enter    Entrance Stairs-Number of Steps 3    Entrance Stairs-Rails Can reach both    [place] Two level;Able to live on main level with bedroom/bathroom    [place] - single point;Walker - 4 wheels;Wheelchair - Brewing technologist      Prior Function   Level of Independence Independent    Vocation Retired    Leisure attend tennis matches      Cognition   Overall Cognitive Status Within Functional Limits for tasks assessed      Observation/Other Assessments   Focus on  Therapeutic Outcomes (FOTO)  32% function      ROM / Strength   AROM / PROM / Strength Strength      Strength   Strength Assessment Site Hip;Knee;Ankle    Right/Left Hip Right;Left    Right Hip  Flexion 3+/5    Left Hip Flexion 3+/5    Right/Left Knee Right;Left    Right Knee Flexion 3+/5    Right Knee Extension 3+/5    Left Knee Flexion 3+/5    Left Knee Extension 3+/5    Right/Left Ankle Left;Right    Right Ankle Dorsiflexion 3+/5    Left Ankle Dorsiflexion 3+/5      Ambulation/Gait   Ambulation/Gait Yes    Ambulation/Gait Assistance 5: Supervision    Ambulation Distance (Feet) 74 Feet    Assistive device Straight cane    Gait Pattern Decreased step length - right;Decreased step length - left;Decreased stride length;Decreased hip/knee flexion - right;Antalgic;Decreased trunk rotation;Trunk flexed    Gait velocity reduced    Gait Comments 2MW, 18 feet without cane in 2 minutes - stopped after 18 feet                        Objective measurements completed on examination: See above findings.       [facility] Adult PT Treatment/Exercise - 11/25/20 0001       Exercises   Exercises Lumbar      Lumbar Exercises: Standing   Other Standing Lumbar Exercises standing tall 1 minute x1      Lumbar Exercises: Seated   Long Arc Quad on Chair AROM;Both;3 sets;5 reps   5" holds                   PT Education - 11/25/20 0956     Education Details on posture, on walker height (adjusted to reduce trunk flexion), on HEP and POC.    Person(s) Educated Patient    Methods Explanation    Comprehension Verbalized understanding              PT Short Term Goals - 11/25/20 0934       PT SHORT TERM GOAL #1   Title Patient will report at least 25% improvement in overall symptoms and/or function to demonstrate improved functional mobility    Time 3    Period Weeks    Status New    Target Date 12/16/20      PT SHORT TERM GOAL #2   Title Patient will be  independent in self management strategies to improve quality of life and functional outcomes.    Time 3    Period Weeks    Status New    Target Date 12/16/20      PT SHORT TERM GOAL #3   Title Patient will be able to ambulate at least 226 feet wtih cane if needed in 2 minutes to demonstrate improved walking mobility    Time 3    Period Weeks    Status New    Target Date 12/16/20               PT Long Term Goals - 11/25/20 0935       PT LONG TERM GOAL #1   Title Patient will mee predicted FOTO score to demonstrate improved overall function.    Time 6    Period Weeks    Status New    Target Date 01/06/21      PT LONG TERM GOAL #2   Title Patient will be able to walk at least 150 feet without assistive device and withonly standign rest breaks to demonstrate improved ambulatory mobility    Time 6    Period Weeks    Status New    Target Date  01/06/21      PT LONG TERM GOAL #3   Title Patient will report at least 50% improvement in overall symptoms and/or function to demonstrate improved functional mobility    Time 6    Period Weeks    Status New    Target Date 12/16/20                    Plan - 11/25/20 [id number]     Clinical Impression Statement Patient is a 70 y.o. female  who presents to physical therapy with complaint of chronic low back pain. Patient with recent lumbar fusion of L4/5 on 08/03/20. Patient with history of lumbar fusion from L2-5 (3 previous surgeries) and with history of cervical fusion as well.  Patient demonstrates decreased strength, ROM restriction, balance deficits and gait abnormalities which are likely contributing to symptoms of pain and are negatively impacting patient ability to perform ADLs and functional mobility tasks. Patient will benefit from skilled physical therapy services to address these deficits to reduce pain, improve level of function with ADLs, functional mobility tasks, and reduce risk for falls.    Personal Factors and  Comorbidities Comorbidity 1;Comorbidity 2;Comorbidity 3+    Comorbidities history of 4 lumbar surgeries, history of cervical fusion    Examination-Activity Limitations Locomotion Level;Lift;Transfers;Stand;Stairs;Bend;Squat;Bed Mobility;Sleep;Sit    Examination-Participation Restrictions Cleaning;Shop;Community Activity;Driving;Meal Prep    Stability/Clinical Decision Making Stable/Uncomplicated    Clinical Decision Making Low    Rehab Potential Poor    PT Frequency Other (comment)   total of 8 visists at 0000000 over 6 week certification period   PT Duration 6 weeks    PT Treatment/Interventions ADLs/Self Care Home Management;Aquatic Therapy;Cryotherapy;Electrical Stimulation;Moist Heat;Manual techniques;Therapeutic exercise;Therapeutic activities;Gait training;DME Instruction;Neuromuscular re-education;Patient/family education;Dry needling    PT Home Exercise Plan LAQ, standing tall for one minute    Consulted and Agree with Plan of Care Patient             Patient will benefit from skilled therapeutic intervention in order to improve the following deficits and impairments:  Abnormal gait, Decreased endurance, Decreased strength, Pain, Decreased activity tolerance, Decreased balance, Decreased mobility, Difficulty walking, Decreased range of motion, Postural dysfunction, Improper body mechanics, Decreased knowledge of use of DME  Visit Diagnosis: Low back pain, unspecified back pain laterality, unspecified chronicity, unspecified whether sciatica present  Muscle weakness (generalized)  Other abnormalities of gait and mobility     Problem List Patient Active Problem List   Diagnosis Date Noted   Foraminal stenosis of lumbar region 08/03/2020   Cervical spondylosis with myelopathy and radiculopathy 03/09/2020   AKI (acute kidney injury) ([place]) 02/07/2019   Acute respiratory failure with hypoxia ([place]) 02/06/2019   Acute respiratory disease due to COVID-19 virus 02/06/2019    Community acquired pneumonia 02/06/2019   Hyponatremia 02/06/2019   Asthma    Diabetes mellitus without complication ([place])    Closed nondisplaced odontoid fracture with type II morphology ([place]) 01/27/2018   Odontoid fracture ([place]) 01/24/2018   Chest pain 11/14/2017   Transaminitis 06/22/2016   Collagenous colitis 05/23/2016   Psoriasis 05/21/2016   High risk medication use 02/15/2016   Age-related osteoporosis without current pathological fracture 02/15/2016   Trochanteric bursitis, left hip 02/15/2016   Sacroiliitis, not elsewhere classified ([place]) 02/15/2016   Chronic pain syndrome 02/15/2016   Abnormal weight gain 06/25/2012   Hypertension 07/03/2011   Hypercholesteremia 07/03/2011   Psoriatic arthritis ([place]) 07/03/2011   Osteoarthritis of multiple joints 07/03/2011   Esophageal motility disorder 02/14/2011   Cough  02/14/2011   GERD 05/05/2010    9:59 AM, 11/25/20 Jerene Pitch, DPT Physical Therapy with [facility]  [phone] office   [place] [address] [place], [place], [address] Phone: [phone]   Fax:  [phone]  Name: Tonya Hall MRN: [id number] Date of Birth: 06/09/1950

## 2020-11-25 NOTE — Telephone Encounter (Signed)
Next Visit: 01/18/2021   Last Visit: 10/19/2020   Last Fill: 08/22/2020  Current Dose per office note 10/19/2020: not dicussed   Okay to refill Topamax?

## 2020-11-29 ENCOUNTER — Other Ambulatory Visit: Payer: Self-pay

## 2020-11-29 ENCOUNTER — Ambulatory Visit (HOSPITAL_COMMUNITY): Payer: PPO | Admitting: Physical Therapy

## 2020-11-29 ENCOUNTER — Encounter (HOSPITAL_COMMUNITY): Payer: Self-pay | Admitting: Physical Therapy

## 2020-11-29 DIAGNOSIS — M6281 Muscle weakness (generalized): Secondary | ICD-10-CM

## 2020-11-29 DIAGNOSIS — M545 Low back pain, unspecified: Secondary | ICD-10-CM | POA: Diagnosis not present

## 2020-11-29 DIAGNOSIS — R2689 Other abnormalities of gait and mobility: Secondary | ICD-10-CM

## 2020-11-29 DIAGNOSIS — R29898 Other symptoms and signs involving the musculoskeletal system: Secondary | ICD-10-CM

## 2020-11-29 NOTE — Patient Instructions (Signed)
Access Code: [email or web address] URL: [email or web address] Date: 11/29/2020 Prepared by: Mitzi Hansen Lailynn Southgate  Exercises Supine Hip Adduction Isometric with Diona Foley - 1 x daily - 7 x weekly - 10 reps - 10 second hold Hooklying Isometric Hip Abduction with Belt - 1 x daily - 7 x weekly - 10 reps - 10 second hold Supine Bilateral Hamstring Sets - 1 x daily - 7 x weekly - 10 reps - 10 second hold

## 2020-11-29 NOTE — Therapy (Signed)
[place], [place], [address] Phone: [phone]   Fax:  [phone]  Physical Therapy Treatment  Patient Details  Name: Tonya Hall MRN: [id number] Date of Birth: 05-01-1950 Referring Provider (PT): Patricia Nettle NP   Encounter Date: 11/29/2020   PT End of Session - 11/29/20 0747     Visit Number 2    Number of Visits 8    Date for PT Re-Evaluation 01/06/21    Authorization Type [employer] - visits based on med nec. no auth    PT Start Time 0747    PT Stop Time 0825    PT Time Calculation (min) 38 min    Activity Tolerance Patient limited by pain    Behavior During Therapy [facility] for tasks assessed/performed             Past Medical History:  Diagnosis Date   Asthma    Albuterol in haler prn   Cataract    immature unsure which eye   Chronic back pain    COVID-19 October/November 2020   Degenerative disk disease    psoriatic   Diabetes mellitus without complication (HCC)    diet controlled   Esophageal motility disorder    Non-specific, see modified barium study/speech path, BP   Fibromyalgia    GERD (gastroesophageal reflux disease)    takes Protonix bid   Headache 2019   History of blood transfusion    post c-section   History of bronchitis    HTN (hypertension)    Hyperlipidemia    takes Pravastatin daily   Lymphocytic colitis 05/26/2010   Responded to Entocort x 3 MOS   Neuropathy    Nonallergic rhinitis    Osteoporosis    gets Boniva every 3 months   Peripheral edema    takes HCTZ daily   Pneumonia 02/2019   Psoriatic arthritis ([place])    Dr. Katherina Right   Psoriatic arthritis [facility])    bilateral hands   Raynaud's disease    Seasonal allergies    Shingles 2020   SUI (stress urinary incontinence, female)    Urinary urgency     Past Surgical History:  Procedure Laterality Date   ANTERIOR CERVICAL DECOMP/DISCECTOMY FUSION N/A 03/09/2020   Procedure: ANTERIOR  CERVICAL DECOMPRESSION/DISCECTOMY FUSION, INTERBODY PROSTHESIS, PLATE SCREWS CERVICAL FIVE-SIX, CERVICAL SIX-SEVEN;  Surgeon: Newman Pies, MD;  Location: [place];  Service: Neurosurgery;  Laterality: N/A;   BACK SURGERY  2003   BIOPSY  04/17/2016   Procedure: BIOPSY;  Surgeon: Danie Binder, MD;  [facility];  Service: Endoscopy;;  random colon bx's   CERVICAL SPINE SURGERY  09/09/2017   CESAREAN SECTION  1974, 1978       COLONOSCOPY  06/19/2008   [id number] internal hemorrhoids/mild sigmoin colon diverticulosis   COLONOSCOPY  2012   Dr. Gala Romney: normal rectum, diverticula, lymphocytic colitis    COLONOSCOPY WITH PROPOFOL N/A 04/17/2016   Dr. Oneida Alar: Normal terminal ileum, internal/external hemorrhoids, random colon biopsies consistent with collagenous colitis   DILATION AND CURETTAGE OF UTERUS  1977   spontaneous abortion   ESOPHAGOGASTRODUODENOSCOPY  06/19/2008   [id number] gastritis   LUMBAR DISC ARTHROPLASTY  7/98   LUMBAR FUSION  6/03, 11/08, 11/14   L4-5 fusion, L2-3 fusion, L1-2   LUMBAR FUSION  08/03/2020   L4-L5   LUMBAR LAMINECTOMY  03/2020   ODONTOID SCREW INSERTION N/A 01/27/2018   Procedure: ODONTOID SCREW INSERTION;  Surgeon: Newman Pies, MD;  Location: [facility]  OR;  Service: Neurosurgery;  Laterality: N/A;  ODONTOID SCREW INSERTION   TONSILLECTOMY     TONSILLECTOMY AND ADENOIDECTOMY     TUBAL LIGATION      There were no vitals filed for this visit.   Subjective Assessment - 11/29/20 0747     Subjective Nothing new. HEP is going well. Not alot of back pain, mostly tired from trying to stand up. Bad pain is gone.    Currently in Pain? No/denies    Pain Onset More than a month ago                               [facility] Adult PT Treatment/Exercise - 11/29/20 0001       Lumbar Exercises: Supine   Ab Set 5 reps;5 seconds    Glut Set 5 seconds;10 reps    Bent Knee Raise 10 reps    Bent Knee Raise Limitations 2 sets with ab set     Other Supine Lumbar Exercises hip abd/add iso with belt/ball 10x 10 second holds    Other Supine Lumbar Exercises hamstring iso 10x 10 with green ball                    PT Education - 11/29/20 0747     Education Details HEP    Person(s) Educated Patient    Methods Explanation    Comprehension Verbalized understanding              PT Short Term Goals - 11/25/20 0934       PT SHORT TERM GOAL #1   Title Patient will report at least 25% improvement in overall symptoms and/or function to demonstrate improved functional mobility    Time 3    Period Weeks    Status New    Target Date 12/16/20      PT SHORT TERM GOAL #2   Title Patient will be independent in self management strategies to improve quality of life and functional outcomes.    Time 3    Period Weeks    Status New    Target Date 12/16/20      PT SHORT TERM GOAL #3   Title Patient will be able to ambulate at least 226 feet wtih cane if needed in 2 minutes to demonstrate improved walking mobility    Time 3    Period Weeks    Status New    Target Date 12/16/20               PT Long Term Goals - 11/25/20 0935       PT LONG TERM GOAL #1   Title Patient will mee predicted FOTO score to demonstrate improved overall function.    Time 6    Period Weeks    Status New    Target Date 01/06/21      PT LONG TERM GOAL #2   Title Patient will be able to walk at least 150 feet without assistive device and withonly standign rest breaks to demonstrate improved ambulatory mobility    Time 6    Period Weeks    Status New    Target Date 01/06/21      PT LONG TERM GOAL #3   Title Patient will report at least 50% improvement in overall symptoms and/or function to demonstrate improved functional mobility    Time 6    Period Weeks    Status New  Target Date 12/16/20                   Plan - 11/29/20 0747     Clinical Impression Statement Began with core and hip strengthening with table  exercises. Patient given cueing for TRA activation with all exercises completed. Patient with c/o back pain with attempted bridge despite cueing for glute set prior. Patient requires intermittent rest breaks during session due to fatigue. Patient overall tolerates session well. Patient will continue to benefit from physical therapy in order to reduce impairment and improve function.    Personal Factors and Comorbidities Comorbidity 1;Comorbidity 2;Comorbidity 3+    Comorbidities history of 4 lumbar surgeries, history of cervical fusion    Examination-Activity Limitations Locomotion Level;Lift;Transfers;Stand;Stairs;Bend;Squat;Bed Mobility;Sleep;Sit    Examination-Participation Restrictions Cleaning;Shop;Community Activity;Driving;Meal Prep    Stability/Clinical Decision Making Stable/Uncomplicated    Rehab Potential Poor    PT Frequency Other (comment)   total of 8 visists at 0000000 over 6 week certification period   PT Duration 6 weeks    PT Treatment/Interventions ADLs/Self Care Home Management;Aquatic Therapy;Cryotherapy;Electrical Stimulation;Moist Heat;Manual techniques;Therapeutic exercise;Therapeutic activities;Gait training;DME Instruction;Neuromuscular re-education;Patient/family education;Dry needling    PT Next Visit Plan core/hip strength, transition to posture and functional strength when able    PT Home Exercise Plan LAQ, standing tall for one minute 8/30 hip abd/add iso, hamstring iso    Consulted and Agree with Plan of Care Patient             Patient will benefit from skilled therapeutic intervention in order to improve the following deficits and impairments:  Abnormal gait, Decreased endurance, Decreased strength, Pain, Decreased activity tolerance, Decreased balance, Decreased mobility, Difficulty walking, Decreased range of motion, Postural dysfunction, Improper body mechanics, Decreased knowledge of use of DME  Visit Diagnosis: Low back pain, unspecified back pain  laterality, unspecified chronicity, unspecified whether sciatica present  Muscle weakness (generalized)  Other abnormalities of gait and mobility  Other symptoms and signs involving the musculoskeletal system     Problem List Patient Active Problem List   Diagnosis Date Noted   Foraminal stenosis of lumbar region 08/03/2020   Cervical spondylosis with myelopathy and radiculopathy 03/09/2020   AKI (acute kidney injury) ([place]) 02/07/2019   Acute respiratory failure with hypoxia ([place]) 02/06/2019   Acute respiratory disease due to COVID-19 virus 02/06/2019   Community acquired pneumonia 02/06/2019   Hyponatremia 02/06/2019   Asthma    Diabetes mellitus without complication ([place])    Closed nondisplaced odontoid fracture with type II morphology ([place]) 01/27/2018   Odontoid fracture ([place]) 01/24/2018   Chest pain 11/14/2017   Transaminitis 06/22/2016   Collagenous colitis 05/23/2016   Psoriasis 05/21/2016   High risk medication use 02/15/2016   Age-related osteoporosis without current pathological fracture 02/15/2016   Trochanteric bursitis, left hip 02/15/2016   Sacroiliitis, not elsewhere classified ([place]) 02/15/2016   Chronic pain syndrome 02/15/2016   Abnormal weight gain 06/25/2012   Hypertension 07/03/2011   Hypercholesteremia 07/03/2011   Psoriatic arthritis ([place]) 07/03/2011   Osteoarthritis of multiple joints 07/03/2011   Esophageal motility disorder 02/14/2011   Cough 02/14/2011   GERD 05/05/2010    8:29 AM, 11/29/20 Mearl Latin PT, DPT Physical Therapist at [facility] [address] [place], [place], [address] Phone: [phone]   Fax:  [phone]  Name: DESTYNI OGG MRN: [id number] Date of Birth: 1950/11/24

## 2020-12-01 ENCOUNTER — Ambulatory Visit (HOSPITAL_COMMUNITY): Payer: PPO | Attending: Student | Admitting: Physical Therapy

## 2020-12-01 ENCOUNTER — Encounter (HOSPITAL_COMMUNITY): Payer: Self-pay | Admitting: Physical Therapy

## 2020-12-01 ENCOUNTER — Other Ambulatory Visit: Payer: Self-pay

## 2020-12-01 DIAGNOSIS — M5412 Radiculopathy, cervical region: Secondary | ICD-10-CM | POA: Diagnosis not present

## 2020-12-01 DIAGNOSIS — M6281 Muscle weakness (generalized): Secondary | ICD-10-CM | POA: Diagnosis not present

## 2020-12-01 DIAGNOSIS — M62838 Other muscle spasm: Secondary | ICD-10-CM | POA: Diagnosis not present

## 2020-12-01 DIAGNOSIS — R29898 Other symptoms and signs involving the musculoskeletal system: Secondary | ICD-10-CM | POA: Insufficient documentation

## 2020-12-01 DIAGNOSIS — R2689 Other abnormalities of gait and mobility: Secondary | ICD-10-CM | POA: Diagnosis not present

## 2020-12-01 DIAGNOSIS — M545 Low back pain, unspecified: Secondary | ICD-10-CM | POA: Diagnosis not present

## 2020-12-01 NOTE — Patient Instructions (Signed)
Access Code: [email or web address] URL: [email or web address] Date: 12/01/2020 Prepared by: Mitzi Hansen Rhyder Bratz  Exercises Standing Hip Abduction with Counter Support - 1 x daily - 7 x weekly - 2 sets - 10 reps Standing Hip Extension with Counter Support - 1 x daily - 7 x weekly - 2 sets - 10 reps Standing March with Counter Support - 1 x daily - 7 x weekly - 2 sets - 10 reps

## 2020-12-01 NOTE — Therapy (Signed)
[place], [place], [address] Phone: [phone]   Fax:  [phone]  Physical Therapy Treatment  Patient Details  Name: Tonya Hall MRN: [id number] Date of Birth: [birth year] Referring Provider (PT): Patricia Nettle NP   Encounter Date: 12/01/2020   PT End of Session - 12/01/20 0746     Visit Number 3    Number of Visits 8    Date for PT Re-Evaluation 01/06/21    Authorization Type [employer] - visits based on med nec. no auth    PT Start Time 0747    PT Stop Time 0825    PT Time Calculation (min) 38 min    Activity Tolerance Patient limited by pain    Behavior During Therapy [facility] for tasks assessed/performed             Past Medical History:  Diagnosis Date   Asthma    Albuterol in haler prn   Cataract    immature unsure which eye   Chronic back pain    COVID-19 October/November 2020   Degenerative disk disease    psoriatic   Diabetes mellitus without complication (HCC)    diet controlled   Esophageal motility disorder    Non-specific, see modified barium study/speech path, BP   Fibromyalgia    GERD (gastroesophageal reflux disease)    takes Protonix bid   Headache 2019   History of blood transfusion    post c-section   History of bronchitis    HTN (hypertension)    Hyperlipidemia    takes Pravastatin daily   Lymphocytic colitis 05/26/2010   Responded to Entocort x 3 MOS   Neuropathy    Nonallergic rhinitis    Osteoporosis    gets Boniva every 3 months   Peripheral edema    takes HCTZ daily   Pneumonia 02/2019   Psoriatic arthritis ([place])    Dr. Katherina Right   Psoriatic arthritis [facility])    bilateral hands   Raynaud's disease    Seasonal allergies    Shingles 2020   SUI (stress urinary incontinence, female)    Urinary urgency     Past Surgical History:  Procedure Laterality Date   ANTERIOR CERVICAL DECOMP/DISCECTOMY FUSION N/A 03/09/2020   Procedure: ANTERIOR  CERVICAL DECOMPRESSION/DISCECTOMY FUSION, INTERBODY PROSTHESIS, PLATE SCREWS CERVICAL FIVE-SIX, CERVICAL SIX-SEVEN;  Surgeon: Newman Pies, MD;  Location: [place];  Service: Neurosurgery;  Laterality: N/A;   BACK SURGERY  2003   BIOPSY  04/17/2016   Procedure: BIOPSY;  Surgeon: Danie Binder, MD;  [facility];  Service: Endoscopy;;  random colon bx's   CERVICAL SPINE SURGERY  09/09/2017   CESAREAN SECTION  1974, 1978       COLONOSCOPY  06/19/2008   [id number] internal hemorrhoids/mild sigmoin colon diverticulosis   COLONOSCOPY  2012   Dr. Gala Romney: normal rectum, diverticula, lymphocytic colitis    COLONOSCOPY WITH PROPOFOL N/A 04/17/2016   Dr. Oneida Alar: Normal terminal ileum, internal/external hemorrhoids, random colon biopsies consistent with collagenous colitis   DILATION AND CURETTAGE OF UTERUS  1977   spontaneous abortion   ESOPHAGOGASTRODUODENOSCOPY  06/19/2008   [id number] gastritis   LUMBAR DISC ARTHROPLASTY  7/98   LUMBAR FUSION  6/03, 11/08, 11/14   L4-5 fusion, L2-3 fusion, L1-2   LUMBAR FUSION  08/03/2020   L4-L5   LUMBAR LAMINECTOMY  03/2020   ODONTOID SCREW INSERTION N/A 01/27/2018   Procedure: ODONTOID SCREW INSERTION;  Surgeon: Newman Pies, MD;  Location: [facility]  OR;  Service: Neurosurgery;  Laterality: N/A;  ODONTOID SCREW INSERTION   TONSILLECTOMY     TONSILLECTOMY AND ADENOIDECTOMY     TUBAL LIGATION      There were no vitals filed for this visit.   Subjective Assessment - 12/01/20 0747     Subjective She stated her back was sore after laying on table last time. Back pain is much better.    Currently in Pain? Yes    Pain Score 2     Pain Location Back    Pain Orientation Lower    Pain Type Chronic pain    Pain Onset More than a month ago                               [facility] Adult PT Treatment/Exercise - 12/01/20 0001       Lumbar Exercises: Standing   Heel Raises 10 reps    Other Standing Lumbar Exercises hamstring curls  2x 10, marching 2x 10, hip abduction 2x 10, hip extension 2x 10      Lumbar Exercises: Seated   Long Arc Quad on Chair Both;2 sets;10 reps    LAQ on Chair Limitations cueing for posture    Other Seated Lumbar Exercises marching 2x 10                    PT Education - 12/01/20 0747     Education Details HEP    Person(s) Educated Patient    Methods Explanation    Comprehension Verbalized understanding              PT Short Term Goals - 11/25/20 0934       PT SHORT TERM GOAL #1   Title Patient will report at least 25% improvement in overall symptoms and/or function to demonstrate improved functional mobility    Time 3    Period Weeks    Status New    Target Date 12/16/20      PT SHORT TERM GOAL #2   Title Patient will be independent in self management strategies to improve quality of life and functional outcomes.    Time 3    Period Weeks    Status New    Target Date 12/16/20      PT SHORT TERM GOAL #3   Title Patient will be able to ambulate at least 226 feet wtih cane if needed in 2 minutes to demonstrate improved walking mobility    Time 3    Period Weeks    Status New    Target Date 12/16/20               PT Long Term Goals - 11/25/20 0935       PT LONG TERM GOAL #1   Title Patient will mee predicted FOTO score to demonstrate improved overall function.    Time 6    Period Weeks    Status New    Target Date 01/06/21      PT LONG TERM GOAL #2   Title Patient will be able to walk at least 150 feet without assistive device and withonly standign rest breaks to demonstrate improved ambulatory mobility    Time 6    Period Weeks    Status New    Target Date 01/06/21      PT LONG TERM GOAL #3   Title Patient will report at least 50% improvement in overall symptoms and/or function  to demonstrate improved functional mobility    Time 6    Period Weeks    Status New    Target Date 12/16/20                   Plan - 12/01/20 0747      Clinical Impression Statement Began standing strengthening today which patient tolerates well with intermittent c/o fatigue/pain. Overall, she performs with good mechanics following initial demonstration. Intermittent cueing for posture throughout session with both standing and sitting exercises. Patient will continue to benefit from skilled physical therapy in order to reduce impairment and improve function.    Personal Factors and Comorbidities Comorbidity 1;Comorbidity 2;Comorbidity 3+    Comorbidities history of 4 lumbar surgeries, history of cervical fusion    Examination-Activity Limitations Locomotion Level;Lift;Transfers;Stand;Stairs;Bend;Squat;Bed Mobility;Sleep;Sit    Examination-Participation Restrictions Cleaning;Shop;Community Activity;Driving;Meal Prep    Stability/Clinical Decision Making Stable/Uncomplicated    Rehab Potential Poor    PT Frequency Other (comment)   total of 8 visists at 0000000 over 6 week certification period   PT Duration 6 weeks    PT Treatment/Interventions ADLs/Self Care Home Management;Aquatic Therapy;Cryotherapy;Electrical Stimulation;Moist Heat;Manual techniques;Therapeutic exercise;Therapeutic activities;Gait training;DME Instruction;Neuromuscular re-education;Patient/family education;Dry needling    PT Next Visit Plan core/hip strength, transition to posture and functional strength when able    PT Home Exercise Plan LAQ, standing tall for one minute 8/30 hip abd/add iso, hamstring iso 9/1 standing hip abd/ ext/marching    Consulted and Agree with Plan of Care Patient             Patient will benefit from skilled therapeutic intervention in order to improve the following deficits and impairments:  Abnormal gait, Decreased endurance, Decreased strength, Pain, Decreased activity tolerance, Decreased balance, Decreased mobility, Difficulty walking, Decreased range of motion, Postural dysfunction, Improper body mechanics, Decreased knowledge of use of  DME  Visit Diagnosis: Low back pain, unspecified back pain laterality, unspecified chronicity, unspecified whether sciatica present  Muscle weakness (generalized)  Other abnormalities of gait and mobility  Other symptoms and signs involving the musculoskeletal system     Problem List Patient Active Problem List   Diagnosis Date Noted   Foraminal stenosis of lumbar region 08/03/2020   Cervical spondylosis with myelopathy and radiculopathy 03/09/2020   AKI (acute kidney injury) ([place]) 02/07/2019   Acute respiratory failure with hypoxia ([place]) 02/06/2019   Acute respiratory disease due to COVID-19 virus 02/06/2019   Community acquired pneumonia 02/06/2019   Hyponatremia 02/06/2019   Asthma    Diabetes mellitus without complication ([place])    Closed nondisplaced odontoid fracture with type II morphology ([place]) 01/27/2018   Odontoid fracture ([place]) 01/24/2018   Chest pain 11/14/2017   Transaminitis 06/22/2016   Collagenous colitis 05/23/2016   Psoriasis 05/21/2016   High risk medication use 02/15/2016   Age-related osteoporosis without current pathological fracture 02/15/2016   Trochanteric bursitis, left hip 02/15/2016   Sacroiliitis, not elsewhere classified ([place]) 02/15/2016   Chronic pain syndrome 02/15/2016   Abnormal weight gain 06/25/2012   Hypertension 07/03/2011   Hypercholesteremia 07/03/2011   Psoriatic arthritis ([place]) 07/03/2011   Osteoarthritis of multiple joints 07/03/2011   Esophageal motility disorder 02/14/2011   Cough 02/14/2011   GERD 05/05/2010    8:28 AM, 12/01/20 Mearl Latin PT, DPT Physical Therapist at [facility] [address] [place], [place], [address] Phone: [phone]   Fax:  [phone]  Name: Tonya Hall MRN: [id number] Date of  Birth: Aug 24, 1950

## 2020-12-07 ENCOUNTER — Encounter (HOSPITAL_COMMUNITY): Payer: PPO | Admitting: Physical Therapy

## 2020-12-12 ENCOUNTER — Other Ambulatory Visit: Payer: Self-pay

## 2020-12-12 ENCOUNTER — Ambulatory Visit (INDEPENDENT_AMBULATORY_CARE_PROVIDER_SITE_OTHER): Payer: PPO | Admitting: Obstetrics & Gynecology

## 2020-12-12 ENCOUNTER — Other Ambulatory Visit (HOSPITAL_COMMUNITY)
Admission: RE | Admit: 2020-12-12 | Discharge: 2020-12-12 | Disposition: A | Discharge status: Home / Self Care | Payer: PPO | Source: Ambulatory Visit | Attending: Obstetrics & Gynecology | Admitting: Obstetrics & Gynecology

## 2020-12-12 ENCOUNTER — Encounter (HOSPITAL_BASED_OUTPATIENT_CLINIC_OR_DEPARTMENT_OTHER): Payer: Self-pay | Admitting: Obstetrics & Gynecology

## 2020-12-12 VITALS — BP 180/77 | HR 106 | Ht 59.0 in | Wt 134.4 lb

## 2020-12-12 DIAGNOSIS — Z780 Asymptomatic menopausal state: Secondary | ICD-10-CM

## 2020-12-12 DIAGNOSIS — Z01419 Encounter for gynecological examination (general) (routine) without abnormal findings: Secondary | ICD-10-CM

## 2020-12-12 DIAGNOSIS — I10 Essential (primary) hypertension: Secondary | ICD-10-CM

## 2020-12-12 DIAGNOSIS — M810 Age-related osteoporosis without current pathological fracture: Secondary | ICD-10-CM

## 2020-12-12 DIAGNOSIS — Z9189 Other specified personal risk factors, not elsewhere classified: Secondary | ICD-10-CM | POA: Diagnosis not present

## 2020-12-12 DIAGNOSIS — Z124 Encounter for screening for malignant neoplasm of cervix: Secondary | ICD-10-CM

## 2020-12-12 DIAGNOSIS — D849 Immunodeficiency, unspecified: Secondary | ICD-10-CM

## 2020-12-12 DIAGNOSIS — N3946 Mixed incontinence: Secondary | ICD-10-CM | POA: Diagnosis not present

## 2020-12-12 NOTE — Patient Instructions (Signed)
Oxytrol patches

## 2020-12-12 NOTE — Progress Notes (Signed)
70 y.o. [id number] Married White or Caucasian female here for breast and pelvic exam.  I am also following her for increased cancer risks due to long term immunosuppressant use.  New ACOG pap guidelines for women on immunosuppressants discussed.  Denies vaginal bleeding.    Grandson from [place] is here right now visiting.  Always a joy for her.  He is 14.    Pt is having more issues with incontinence.  Does get up at night.  Would like to consider treatment.  We discussed this last year but she can't remember what we discussed.  Oxytrol patches suggested.  We discussed some of the oral options.  No sure she wants a another pill.  Urogyn treatment with bladder botox and acupuncture discussed.  She may want to consider this.    Patient's last menstrual period was 02/01/1999.          Sexually active: No.  H/O STD:  no  Health Maintenance: PCP:  Dr Willey Blade.  Last wellness appt was 05/2020.  Did blood work at that appt:  Dr. Chalmers Cater does lipids Vaccines are up to date:  has not completed shingrix Colonoscopy:  04/17/2016, follow up 10 year MMG:  12/22/2019 Additional images needed.  Follow up was normal. BMD:  does yearly with Dr. Chalmers Cater Last pap smear:  01/02/2018 Negative.   H/o abnormal pap smear:  no   reports that she has never smoked. She has never used smokeless tobacco. She reports that she does not drink alcohol and does not use drugs.  Past Medical History:  Diagnosis Date   Asthma    Albuterol in haler prn   Cataract    immature unsure which eye   Chronic back pain    COVID-19 October/November 2020   Degenerative disk disease    psoriatic   Diabetes mellitus without complication (HCC)    diet controlled   Esophageal motility disorder    Non-specific, see modified barium study/speech path, BP   Fibromyalgia    GERD (gastroesophageal reflux disease)    takes Protonix bid   Headache 2019   History of blood transfusion    post c-section   History of bronchitis    HTN  (hypertension)    Hyperlipidemia    takes Pravastatin daily   Lymphocytic colitis 05/26/2010   Responded to Entocort x 3 MOS   Neuropathy    Nonallergic rhinitis    Osteoporosis    gets Boniva every 3 months   Peripheral edema    takes HCTZ daily   Pneumonia 02/2019   Psoriatic arthritis ([place])    Dr. Katherina Right   Psoriatic arthritis [facility])    bilateral hands   Raynaud's disease    Seasonal allergies    Shingles 2020   SUI (stress urinary incontinence, female)    Urinary urgency     Past Surgical History:  Procedure Laterality Date   ANTERIOR CERVICAL DECOMP/DISCECTOMY FUSION N/A 03/09/2020   Procedure: ANTERIOR CERVICAL DECOMPRESSION/DISCECTOMY FUSION, INTERBODY PROSTHESIS, PLATE SCREWS CERVICAL FIVE-SIX, CERVICAL SIX-SEVEN;  Surgeon: Newman Pies, MD;  Location: [facility];  Service: Neurosurgery;  Laterality: N/A;   BACK SURGERY  2003   BIOPSY  04/17/2016   Procedure: BIOPSY;  Surgeon: Danie Binder, MD;  [facility];  Service: Endoscopy;;  random colon bx's   CERVICAL SPINE SURGERY  09/09/2017   CESAREAN SECTION  1974, 1978       COLONOSCOPY  06/19/2008   [id number] internal hemorrhoids/mild sigmoin colon diverticulosis   COLONOSCOPY  2012  Dr. Gala Romney: normal rectum, diverticula, lymphocytic colitis    COLONOSCOPY WITH PROPOFOL N/A 04/17/2016   Dr. Oneida Alar: Normal terminal ileum, internal/external hemorrhoids, random colon biopsies consistent with collagenous colitis   DILATION AND CURETTAGE OF UTERUS  1977   spontaneous abortion   ESOPHAGOGASTRODUODENOSCOPY  06/19/2008   [id number] gastritis   LUMBAR DISC ARTHROPLASTY  7/98   LUMBAR FUSION  6/03, 11/08, 11/14   L4-5 fusion, L2-3 fusion, L1-2   LUMBAR FUSION  08/03/2020   L4-L5   LUMBAR LAMINECTOMY  03/2020   ODONTOID SCREW INSERTION N/A 01/27/2018   Procedure: ODONTOID SCREW INSERTION;  Surgeon: Newman Pies, MD;  Location: [facility];  Service: Neurosurgery;  Laterality: N/A;  ODONTOID SCREW INSERTION    TONSILLECTOMY     TONSILLECTOMY AND ADENOIDECTOMY     TUBAL LIGATION      Current Outpatient Medications  Medication Sig Dispense Refill   albuterol (PROVENTIL HFA;VENTOLIN HFA) 108 (90 BASE) MCG/ACT inhaler Inhale 2 puffs into the lungs every 6 (six) hours as needed for wheezing or shortness of breath.      Apremilast (OTEZLA) 30 MG TABS Take 1 tablet (30 mg total) by mouth 2 (two) times daily. 180 tablet 0   ascorbic acid (VITAMIN C) 500 MG tablet Take 500 mg by mouth 2 (two) times daily.     aspirin 81 MG tablet Take 81 mg by mouth daily.     Calcium Carb-Cholecalciferol (CALCIUM 600 + D PO) Take 1 tablet by mouth 2 (two) times daily.      cetirizine (ZYRTEC) 10 MG tablet as needed.     docusate sodium (COLACE) 100 MG capsule Take 1 capsule (100 mg total) by mouth 2 (two) times daily. 60 capsule 0   fish oil-omega-3 fatty acids 1000 MG capsule Take 1 g by mouth daily.     gabapentin (NEURONTIN) 300 MG capsule Take 600 mg by mouth See admin instructions. Take 600 mg 3 times daily, may take a 4th 300 mg dose as needed for pain     Ginger, Zingiber officinalis, (GINGER ROOT) 500 MG CAPS Take 500 mg by mouth daily.     Glucosamine Sulfate 1000 MG CAPS Take 2 capsules (2,000 mg total) by mouth daily.     HUMULIN N KWIKPEN 100 UNIT/ML Kiwkpen Inject 10 Units into the skin at bedtime.     hydrocortisone sodium succinate (SOLU-CORTEF) 100 MG SOLR injection Inject 100 mg into the muscle daily as needed (when unable to keep down prednisone tablet).      ipratropium (ATROVENT) 0.06 % nasal spray Place 2 sprays into both nostrils 3 (three) times daily as needed for rhinitis.      Ixekizumab (TALTZ) 80 MG/ML SOAJ Inject 80 mg into the skin every 28 (twenty-eight) days. 3 mL 0   Lancets (ONETOUCH DELICA PLUS [id number]) MISC USE TO TEST 3 TIMESUDAILY.     leflunomide (ARAVA) 20 MG tablet TAKE 1 TABLET BY MOUTH ONCE DAILY. 90 tablet 0   losartan (COZAAR) 100 MG tablet Take 100 mg by mouth daily.      metoprolol succinate (TOPROL-XL) 50 MG 24 hr tablet Take 1 tablet (50 mg total) by mouth daily. Take with or immediately following a meal.     Misc Natural Products (TART CHERRY ADVANCED PO) Take 1 tablet by mouth daily. daily     Multiple Vitamin (MULTIVITAMIN) tablet Take 1 tablet by mouth daily.     nortriptyline (PAMELOR) 25 MG capsule TAKE (1) CAPSULE BY MOUTH AT BEDTIME. (Patient taking differently: Take  25 mg by mouth at bedtime.) 30 capsule 0   omeprazole (PRILOSEC) 20 MG capsule TAKE ONE CAPSULE BY MOUTH TWICE DAILY BEFORE MEALS. 180 capsule 2   ONETOUCH VERIO test strip      potassium chloride SA (K-DUR,KLOR-CON) 20 MEQ tablet Take 20 mEq by mouth daily.      predniSONE (DELTASONE) 5 MG tablet Take 1 tablet (5 mg total) by mouth daily with breakfast. 90 tablet 0   spironolactone (ALDACTONE) 25 MG tablet Take 25 mg by mouth daily.     SURE COMFORT INSULIN SYRINGE 31G X 5/16" 0.3 ML MISC      SURE COMFORT PEN NEEDLES 31G X 5 MM MISC      topiramate (TOPAMAX) 200 MG tablet TAKE (1) TABLET BY MOUTH AT BEDTIME. 90 tablet 0   Turmeric 500 MG CAPS Take 500 mg by mouth every morning.     azelastine (ASTELIN) 0.1 % nasal spray Place 1 spray into both nostrils 2 (two) times daily.  (Patient not taking: Reported on 10/19/2020)     cyclobenzaprine (FLEXERIL) 10 MG tablet Take 1 tablet (10 mg total) by mouth 3 (three) times daily as needed for muscle spasms. (Patient not taking: Reported on 12/12/2020) 50 tablet 0   denosumab (PROLIA) 60 MG/ML SOSY injection Inject 60 mg into the skin every 6 (six) months.     oxyCODONE-acetaminophen (PERCOCET/ROXICET) 5-325 MG tablet Take 1-2 tablets by mouth every 4 (four) hours as needed for moderate pain. (Patient not taking: Reported on 12/12/2020) 30 tablet 0   tetrahydrozoline 0.05 % ophthalmic solution Place 1 drop into both eyes daily as needed (dry eyes). (Patient not taking: Reported on 12/12/2020)     No current facility-administered medications for this  visit.    Family History  Problem Relation Age of Onset   Diabetes Mother    Pancreatitis Mother    Arthritis Mother        psoriatic    Fibromyalgia Mother    Rheumatic fever Father    Diabetes Other        grandparents   Skin cancer Other        grandfather   Hypertension Other        grandparent   Congestive Heart Failure Other        grandfather   Parkinson's disease Other        grandmother   Colon cancer Maternal Uncle    Fibromyalgia Sister    Rheum arthritis Sister    Diabetes Sister    Fibromyalgia Sister    Diabetes Sister    Rheum arthritis Sister    Hypertension Son    Colon polyps Neg Hx     Review of Systems  Constitutional: Negative.   Gastrointestinal: Negative.   Genitourinary:  Negative for frequency, urgency, vaginal discharge and vaginal pain.   Exam:   BP (!) 180/77 (BP Location: Right Arm, Patient Position: Sitting, Cuff Size: Large)   Pulse (!) 106   Ht '4\' 11"'$  (1.499 m) Comment: reported  Wt 134 lb 6.4 oz (61 kg)   LMP 02/01/1999   BMI 27.15 kg/m   Height: '4\' 11"'$  (149.9 cm) (reported)  General appearance: alert, cooperative and appears stated age Breasts: normal appearance, no masses or tenderness Abdomen: soft, non-tender; bowel sounds normal; no masses,  no organomegaly Lymph nodes: Cervical, supraclavicular, and axillary nodes normal.  No abnormal inguinal nodes palpated Neurologic: Grossly normal  Pelvic: External genitalia:  no lesions  Urethra:  normal appearing urethra with no masses, tenderness or lesions              Bartholins and Skenes: normal                 Vagina: normal appearing vagina with atrophic changes and no discharge, no lesions              Cervix: no lesions              Pap taken: Yes.   Bimanual Exam:  Uterus:  normal size, contour, position, consistency, mobility, non-tender              Adnexa: normal adnexa and no mass, fullness, tenderness               Rectovaginal: Confirms                Anus:  normal sphincter tone, no lesions  Chaperone, Octaviano Batty, CMA, was present for exam.  Assessment/Plan: 1. Encntr for gyn exam (general) (routine) w/o abn findings - Pap smear and HR HPV obtained today - Mammogram 12/2019 with follow up that was normal - Colonoscopy 04/2016, follow up 10 years - Bone mineral density done yearly with Dr. Chalmers Cater - lab work done done with specialists and Dr. Willey Blade - vaccines reviewed/updated  2. Postmenopausal - no HRT  3. Immunosuppressed status ([place]) - Cytology - PAP( [place])  4. Age-related osteoporosis without current pathological fracture - followed by Dr. Chalmers Cater, on break from prolia  5. Mixed stress and urge urinary incontinence - Oxytrol patches, OTC, recommended.  Also urogyn referral discussed including non medication therapies.  Pt will let me know if wants referral - Urine Culture  6.  Elevated blood pressure - pt will monitor at home and if still elevated, will follow up with Dr. Willey Blade.

## 2020-12-13 ENCOUNTER — Encounter (HOSPITAL_COMMUNITY): Payer: PPO | Admitting: Physical Therapy

## 2020-12-14 LAB — CYTOLOGY - PAP
Comment: NEGATIVE
Diagnosis: NEGATIVE
High risk HPV: NEGATIVE

## 2020-12-15 ENCOUNTER — Ambulatory Visit (HOSPITAL_COMMUNITY): Payer: PPO

## 2020-12-15 ENCOUNTER — Other Ambulatory Visit: Payer: Self-pay

## 2020-12-15 ENCOUNTER — Encounter (HOSPITAL_COMMUNITY): Payer: Self-pay

## 2020-12-15 DIAGNOSIS — R29898 Other symptoms and signs involving the musculoskeletal system: Secondary | ICD-10-CM

## 2020-12-15 DIAGNOSIS — M6281 Muscle weakness (generalized): Secondary | ICD-10-CM

## 2020-12-15 DIAGNOSIS — M545 Low back pain, unspecified: Secondary | ICD-10-CM

## 2020-12-15 DIAGNOSIS — R2689 Other abnormalities of gait and mobility: Secondary | ICD-10-CM

## 2020-12-15 LAB — URINE CULTURE: Organism ID, Bacteria: NO GROWTH

## 2020-12-15 NOTE — Patient Instructions (Signed)
Semi Tandem Standing    Heel of one foot against arch of other: LATER 1.Look right ___ times. 2. Look left ___ times. 3. Look up ___ times. 4. With right arm, reach right, left, forward and back. Repeat ____ times. Do ____ sessions per day.  http://gt2.exer.us/540   Copyright  VHI. All rights reserved.

## 2020-12-15 NOTE — Therapy (Signed)
[facility] [place], [place], [address] Phone: [phone]   Fax:  [phone]  Physical Therapy Treatment  Patient Details  Name: Tonya Hall MRN: [id number] Date of Birth: [birth year] Referring Provider (PT): Patricia Nettle NP   Encounter Date: 12/15/2020   PT End of Session - 12/15/20 1020     Visit Number 4    Number of Visits 8    Date for PT Re-Evaluation 01/06/21    Authorization Type [employer] - visits based on med nec. no auth    PT Start Time 1020    PT Stop Time 1100    PT Time Calculation (min) 40 min    Activity Tolerance Patient limited by pain    Behavior During Therapy [facility] for tasks assessed/performed             Past Medical History:  Diagnosis Date   Asthma    Albuterol in haler prn   Cataract    immature unsure which eye   Chronic back pain    COVID-19 October/November 2020   Degenerative disk disease    psoriatic   Diabetes mellitus without complication (HCC)    diet controlled   Esophageal motility disorder    Non-specific, see modified barium study/speech path, BP   Fibromyalgia    GERD (gastroesophageal reflux disease)    takes Protonix bid   Headache 2019   History of blood transfusion    post c-section   History of bronchitis    HTN (hypertension)    Hyperlipidemia    takes Pravastatin daily   Lymphocytic colitis 05/26/2010   Responded to Entocort x 3 MOS   Neuropathy    Nonallergic rhinitis    Osteoporosis    gets Boniva every 3 months   Peripheral edema    takes HCTZ daily   Pneumonia 02/2019   Psoriatic arthritis ([place])    Dr. Katherina Right   Psoriatic arthritis [facility])    bilateral hands   Raynaud's disease    Seasonal allergies    Shingles 2020   SUI (stress urinary incontinence, female)    Urinary urgency     Past Surgical History:  Procedure Laterality Date   ANTERIOR CERVICAL DECOMP/DISCECTOMY FUSION N/A 03/09/2020   Procedure: ANTERIOR  CERVICAL DECOMPRESSION/DISCECTOMY FUSION, INTERBODY PROSTHESIS, PLATE SCREWS CERVICAL FIVE-SIX, CERVICAL SIX-SEVEN;  Surgeon: Newman Pies, MD;  Location: [facility];  Service: Neurosurgery;  Laterality: N/A;   BACK SURGERY  2003   BIOPSY  04/17/2016   Procedure: BIOPSY;  Surgeon: Danie Binder, MD;  [facility];  Service: Endoscopy;;  random colon bx's   CERVICAL SPINE SURGERY  09/09/2017   CESAREAN SECTION  1974, 1978       COLONOSCOPY  06/19/2008   [id number] internal hemorrhoids/mild sigmoin colon diverticulosis   COLONOSCOPY  2012   Dr. Gala Romney: normal rectum, diverticula, lymphocytic colitis    COLONOSCOPY WITH PROPOFOL N/A 04/17/2016   Dr. Oneida Alar: Normal terminal ileum, internal/external hemorrhoids, random colon biopsies consistent with collagenous colitis   DILATION AND CURETTAGE OF UTERUS  1977   spontaneous abortion   ESOPHAGOGASTRODUODENOSCOPY  06/19/2008   [id number] gastritis   LUMBAR DISC ARTHROPLASTY  7/98   LUMBAR FUSION  6/03, 11/08, 11/14   L4-5 fusion, L2-3 fusion, L1-2   LUMBAR FUSION  08/03/2020   L4-L5   LUMBAR LAMINECTOMY  03/2020   ODONTOID SCREW INSERTION N/A 01/27/2018   Procedure: ODONTOID SCREW INSERTION;  Surgeon: Newman Pies, MD;  Location: [facility]  OR;  Service: Neurosurgery;  Laterality: N/A;  ODONTOID SCREW INSERTION   TONSILLECTOMY     TONSILLECTOMY AND ADENOIDECTOMY     TUBAL LIGATION      There were no vitals filed for this visit.   Subjective Assessment - 12/15/20 1019     Subjective Has begun driving herself to appointments lately. Has had guests in town for the last 2 weeks. Has been using quad cane because can't get Rollator out of the car because it is too heavy.    Currently in Pain? Yes    Pain Score 5     Pain Location Hip   during session patient reported pain in B SI joints and B hip bursas.   Pain Onset More than a month ago              [facility] Adult PT Treatment/Exercise - 12/15/20 0001       Ambulation/Gait    Gait Comments 2MWT 80 feet with quad cane      Lumbar Exercises: Standing   Heel Raises 10 reps    Heel Raises Limitations + toe raises    Row Strengthening;Both;10 reps;Theraband    Theraband Level (Row) Level 2 (Red)    Row Limitations 2 sets    Shoulder Extension Strengthening;Both;5 reps    Theraband Level (Shoulder Extension) Level 2 (Red)    Shoulder Extension Limitations 2 sets    Other Standing Lumbar Exercises hamstring curls 2x 5 (painful), marching (1# ankle wt)  2x 10, hip abduction x5 (painful)    Other Standing Lumbar Exercises semi-tandme balance B 30 sec each x1      Lumbar Exercises: Seated   Long Arc Quad on Chair Both;2 sets;10 reps   1# ankle weight   LAQ on Chair Limitations cueing for posture    Other Seated Lumbar Exercises marching 2x 10   1# ankle weight   Other Seated Lumbar Exercises hip abd iso w/ RTB, hip add w/ pink ball 5 sec hold                     PT Education - 12/15/20 1054     Education Details Reviewed  HEP components. Issued red theraband for HEP.    Person(s) Educated Patient    Methods Explanation    Comprehension Verbalized understanding              PT Short Term Goals - 12/15/20 1020       PT SHORT TERM GOAL #1   Title Patient will report at least 25% improvement in overall symptoms and/or function to demonstrate improved functional mobility    Time 3    Period Weeks    Status On-going    Target Date 12/16/20      PT SHORT TERM GOAL #2   Title Patient will be independent in self management strategies to improve quality of life and functional outcomes.    Time 3    Period Weeks    Status On-going    Target Date 12/16/20      PT SHORT TERM GOAL #3   Title Patient will be able to ambulate at least 226 feet wtih cane if needed in 2 minutes to demonstrate improved walking mobility    Baseline 12/15/20 - 80 feet with SPC 2MWT    Time 3    Period Weeks    Status On-going    Target Date 12/16/20  PT Long Term Goals - 12/15/20 1021       PT LONG TERM GOAL #1   Title Patient will mee predicted FOTO score to demonstrate improved overall function.    Time 6    Period Weeks    Status On-going      PT LONG TERM GOAL #2   Title Patient will be able to walk at least 150 feet without assistive device and withonly standign rest breaks to demonstrate improved ambulatory mobility    Time 6    Period Weeks    Status On-going      PT LONG TERM GOAL #3   Title Patient will report at least 50% improvement in overall symptoms and/or function to demonstrate improved functional mobility    Time 6    Period Weeks    Status On-going                   Plan - 12/15/20 1020     Clinical Impression Statement Session focused on lower extremity strengthening and accommodation for right knee pain complaints during standing exercises today. Patient able to ambulate with quad cane 80 feet during 2MWT at beginning of session. Adjusted quad cane to increase height by one notch with improved posture during gait. Added semi-tandem stance balance, toe raises, resisted rows and shoulder extension with red theraband and seated clams with red theraband and hip isometric adduction to therapeutic exercises.  Issued red theraband and seated clams handout for HEP Also issued semi-tandem stance balance to HEP. Patient will continue to benefit from skilled therapy services to reduce deficits and improve functional abilities.    Personal Factors and Comorbidities Comorbidity 1;Comorbidity 2;Comorbidity 3+    Comorbidities history of 4 lumbar surgeries, history of cervical fusion    Examination-Activity Limitations Locomotion Level;Lift;Transfers;Stand;Stairs;Bend;Squat;Bed Mobility;Sleep;Sit    Examination-Participation Restrictions Cleaning;Shop;Community Activity;Driving;Meal Prep    Stability/Clinical Decision Making Stable/Uncomplicated    Rehab Potential Poor    PT Frequency Other (comment)   total  of 8 visists at 0000000 over 6 week certification period   PT Duration 6 weeks    PT Treatment/Interventions ADLs/Self Care Home Management;Aquatic Therapy;Cryotherapy;Electrical Stimulation;Moist Heat;Manual techniques;Therapeutic exercise;Therapeutic activities;Gait training;DME Instruction;Neuromuscular re-education;Patient/family education;Dry needling    PT Next Visit Plan core/hip strength, transition to posture and functional strength when able    PT Home Exercise Plan LAQ, standing tall for one minute 8/30 hip abd/add iso, hamstring iso 9/1 standing hip abd/ ext/marching; 9/15 RTB seated clam, semi-tandem balance    Consulted and Agree with Plan of Care Patient             Patient will benefit from skilled therapeutic intervention in order to improve the following deficits and impairments:  Abnormal gait, Decreased endurance, Decreased strength, Pain, Decreased activity tolerance, Decreased balance, Decreased mobility, Difficulty walking, Decreased range of motion, Postural dysfunction, Improper body mechanics, Decreased knowledge of use of DME  Visit Diagnosis: Low back pain, unspecified back pain laterality, unspecified chronicity, unspecified whether sciatica present  Muscle weakness (generalized)  Other abnormalities of gait and mobility  Other symptoms and signs involving the musculoskeletal system     Problem List Patient Active Problem List   Diagnosis Date Noted   Foraminal stenosis of lumbar region 08/03/2020   Cervical spondylosis with myelopathy and radiculopathy 03/09/2020   AKI (acute kidney injury) ([place]) 02/07/2019   Acute respiratory failure with hypoxia ([place]) 02/06/2019   Acute respiratory disease due to COVID-19 virus 02/06/2019   Community acquired pneumonia 02/06/2019   Hyponatremia  02/06/2019   Asthma    Diabetes mellitus without complication ([place])    Closed nondisplaced odontoid fracture with type II morphology ([place]) 01/27/2018   Odontoid  fracture ([place]) 01/24/2018   Chest pain 11/14/2017   Transaminitis 06/22/2016   Collagenous colitis 05/23/2016   Psoriasis 05/21/2016   High risk medication use 02/15/2016   Age-related osteoporosis without current pathological fracture 02/15/2016   Trochanteric bursitis, left hip 02/15/2016   Sacroiliitis, not elsewhere classified ([place]) 02/15/2016   Chronic pain syndrome 02/15/2016   Abnormal weight gain 06/25/2012   Hypertension 07/03/2011   Hypercholesteremia 07/03/2011   Psoriatic arthritis ([place]) 07/03/2011   Osteoarthritis of multiple joints 07/03/2011   Esophageal motility disorder 02/14/2011   Cough 02/14/2011   GERD 05/05/2010   Pamala Hurry D. Hartnett-Rands, MS, PT Per [place] [phone]  Jeannie Done, PT 12/15/2020, 12:52 PM  [facility] [address] [place], [place], [address] Phone: [phone]   Fax:  [phone]  Name: Tonya Hall MRN: [id number] Date of Birth: 1950/08/17

## 2020-12-19 ENCOUNTER — Encounter (HOSPITAL_COMMUNITY): Payer: PPO

## 2020-12-21 ENCOUNTER — Emergency Department (HOSPITAL_BASED_OUTPATIENT_CLINIC_OR_DEPARTMENT_OTHER): Payer: PPO

## 2020-12-21 ENCOUNTER — Encounter (HOSPITAL_COMMUNITY): Payer: PPO | Admitting: Physical Therapy

## 2020-12-21 ENCOUNTER — Encounter (HOSPITAL_BASED_OUTPATIENT_CLINIC_OR_DEPARTMENT_OTHER): Payer: Self-pay | Admitting: *Deleted

## 2020-12-21 ENCOUNTER — Emergency Department (HOSPITAL_BASED_OUTPATIENT_CLINIC_OR_DEPARTMENT_OTHER)
Admission: EM | Admit: 2020-12-21 | Discharge: 2020-12-21 | Disposition: A | Discharge status: Home / Self Care | Payer: PPO | Attending: Emergency Medicine | Admitting: Emergency Medicine

## 2020-12-21 ENCOUNTER — Other Ambulatory Visit: Payer: Self-pay

## 2020-12-21 DIAGNOSIS — Z20822 Contact with and (suspected) exposure to covid-19: Secondary | ICD-10-CM | POA: Diagnosis not present

## 2020-12-21 DIAGNOSIS — Z79899 Other long term (current) drug therapy: Secondary | ICD-10-CM | POA: Insufficient documentation

## 2020-12-21 DIAGNOSIS — R1110 Vomiting, unspecified: Secondary | ICD-10-CM | POA: Diagnosis not present

## 2020-12-21 DIAGNOSIS — R519 Headache, unspecified: Secondary | ICD-10-CM | POA: Insufficient documentation

## 2020-12-21 DIAGNOSIS — K760 Fatty (change of) liver, not elsewhere classified: Secondary | ICD-10-CM | POA: Diagnosis not present

## 2020-12-21 DIAGNOSIS — R112 Nausea with vomiting, unspecified: Secondary | ICD-10-CM

## 2020-12-21 DIAGNOSIS — Z8616 Personal history of COVID-19: Secondary | ICD-10-CM | POA: Diagnosis not present

## 2020-12-21 DIAGNOSIS — J45909 Unspecified asthma, uncomplicated: Secondary | ICD-10-CM | POA: Diagnosis not present

## 2020-12-21 DIAGNOSIS — R000 Tachycardia, unspecified: Secondary | ICD-10-CM | POA: Diagnosis not present

## 2020-12-21 DIAGNOSIS — E119 Type 2 diabetes mellitus without complications: Secondary | ICD-10-CM | POA: Insufficient documentation

## 2020-12-21 DIAGNOSIS — Z7982 Long term (current) use of aspirin: Secondary | ICD-10-CM | POA: Insufficient documentation

## 2020-12-21 DIAGNOSIS — I10 Essential (primary) hypertension: Secondary | ICD-10-CM | POA: Insufficient documentation

## 2020-12-21 DIAGNOSIS — K2901 Acute gastritis with bleeding: Secondary | ICD-10-CM | POA: Diagnosis not present

## 2020-12-21 DIAGNOSIS — R1013 Epigastric pain: Secondary | ICD-10-CM | POA: Diagnosis not present

## 2020-12-21 DIAGNOSIS — K2900 Acute gastritis without bleeding: Secondary | ICD-10-CM | POA: Diagnosis not present

## 2020-12-21 LAB — LIPASE, BLOOD: Lipase: 27 U/L (ref 11–51)

## 2020-12-21 LAB — CBC WITH DIFFERENTIAL/PLATELET
Abs Immature Granulocytes: 0.1 10*3/uL — ABNORMAL HIGH (ref 0.00–0.07)
Basophils Absolute: 0.1 10*3/uL (ref 0.0–0.1)
Basophils Relative: 1 %
Eosinophils Absolute: 0 10*3/uL (ref 0.0–0.5)
Eosinophils Relative: 0 %
HCT: 40.7 % (ref 36.0–46.0)
Hemoglobin: 13.9 g/dL (ref 12.0–15.0)
Immature Granulocytes: 1 %
Lymphocytes Relative: 6 %
Lymphs Abs: 0.8 10*3/uL (ref 0.7–4.0)
MCH: 31.2 pg (ref 26.0–34.0)
MCHC: 34.2 g/dL (ref 30.0–36.0)
MCV: 91.3 fL (ref 80.0–100.0)
Monocytes Absolute: 0.4 10*3/uL (ref 0.1–1.0)
Monocytes Relative: 3 %
Neutro Abs: 11.7 10*3/uL — ABNORMAL HIGH (ref 1.7–7.7)
Neutrophils Relative %: 89 %
Platelets: 320 10*3/uL (ref 150–400)
RBC: 4.46 MIL/uL (ref 3.87–5.11)
RDW: 13.3 % (ref 11.5–15.5)
WBC: 13 10*3/uL — ABNORMAL HIGH (ref 4.0–10.5)
nRBC: 0 % (ref 0.0–0.2)

## 2020-12-21 LAB — COMPREHENSIVE METABOLIC PANEL
ALT: 24 U/L (ref 0–44)
AST: 21 U/L (ref 15–41)
Albumin: 4.4 g/dL (ref 3.5–5.0)
Alkaline Phosphatase: 70 U/L (ref 38–126)
Anion gap: 12 (ref 5–15)
BUN: 11 mg/dL (ref 8–23)
CO2: 20 mmol/L — ABNORMAL LOW (ref 22–32)
Calcium: 9.6 mg/dL (ref 8.9–10.3)
Chloride: 101 mmol/L (ref 98–111)
Creatinine, Ser: 0.64 mg/dL (ref 0.44–1.00)
GFR, Estimated: >60 mL/min (ref >60)
Glucose, Bld: 221 mg/dL — ABNORMAL HIGH (ref 70–99)
Potassium: 3.4 mmol/L — ABNORMAL LOW (ref 3.5–5.1)
Sodium: 133 mmol/L — ABNORMAL LOW (ref 135–145)
Total Bilirubin: 0.5 mg/dL (ref 0.3–1.2)
Total Protein: 7.6 g/dL (ref 6.5–8.1)

## 2020-12-21 LAB — BRAIN NATRIURETIC PEPTIDE: B Natriuretic Peptide: 22.2 pg/mL (ref 0.0–100.0)

## 2020-12-21 LAB — TROPONIN I (HIGH SENSITIVITY)
Troponin I (High Sensitivity): 6 ng/L (ref <18)
Troponin I (High Sensitivity): 6 ng/L (ref <18)

## 2020-12-21 LAB — RESP PANEL BY RT-PCR (FLU A&B, COVID) ARPGX2
Influenza A by PCR: NEGATIVE
Influenza B by PCR: NEGATIVE
SARS Coronavirus 2 by RT PCR: NEGATIVE

## 2020-12-21 LAB — MAGNESIUM: Magnesium: 2 mg/dL (ref 1.7–2.4)

## 2020-12-21 MED ORDER — LABETALOL HCL 5 MG/ML IV SOLN
20.0000 mg | Freq: Once | INTRAVENOUS | Status: AC
  Administered 2020-12-21: 20 mg via INTRAVENOUS
  Filled 2020-12-21: qty 4

## 2020-12-21 MED ORDER — OMEPRAZOLE 20 MG PO CPDR: 20.0000 mg | DELAYED_RELEASE_CAPSULE | Freq: Every day | ORAL | 0 refills | Status: DC

## 2020-12-21 MED ORDER — OMEPRAZOLE 20 MG PO CPDR: 20.0000 mg | DELAYED_RELEASE_CAPSULE | Freq: Two times a day (BID) | ORAL | 0 refills | Status: DC

## 2020-12-21 MED ORDER — ONDANSETRON HCL 4 MG/2ML IJ SOLN
4.0000 mg | Freq: Once | INTRAMUSCULAR | Status: AC
  Administered 2020-12-21: 4 mg via INTRAVENOUS
  Filled 2020-12-21: qty 2

## 2020-12-21 MED ORDER — FAMOTIDINE 20 MG PO TABS: 20.0000 mg | ORAL_TABLET | Freq: Every day | ORAL | 0 refills | Status: DC

## 2020-12-21 MED ORDER — LIDOCAINE VISCOUS HCL 2 % MT SOLN
15.0000 mL | Freq: Once | OROMUCOSAL | Status: AC
  Administered 2020-12-21: 15 mL via ORAL
  Filled 2020-12-21: qty 15

## 2020-12-21 MED ORDER — ONDANSETRON 4 MG PO TBDP: 4.0000 mg | ORAL_TABLET | Freq: Three times a day (TID) | ORAL | 0 refills | Status: DC | PRN

## 2020-12-21 MED ORDER — POTASSIUM CHLORIDE CRYS ER 20 MEQ PO TBCR
20.0000 meq | EXTENDED_RELEASE_TABLET | Freq: Once | ORAL | Status: AC
  Administered 2020-12-21: 20 meq via ORAL
  Filled 2020-12-21: qty 1

## 2020-12-21 MED ORDER — ALUM & MAG HYDROXIDE-SIMETH 200-200-20 MG/5ML PO SUSP
30.0000 mL | Freq: Once | ORAL | Status: AC
  Administered 2020-12-21: 30 mL via ORAL
  Filled 2020-12-21: qty 30

## 2020-12-21 MED ORDER — IOHEXOL 350 MG/ML SOLN
85.0000 mL | Freq: Once | INTRAVENOUS | Status: AC | PRN
  Administered 2020-12-21: 85 mL via INTRAVENOUS

## 2020-12-21 NOTE — ED Notes (Signed)
ED Provider at bedside. Lawsing MD.

## 2020-12-21 NOTE — ED Provider Notes (Signed)
[facility] EMERGENCY DEPT Provider Note   CSN: [id number] Arrival date & time: 12/21/20  1434     History Chief Complaint  Patient presents with   Abdominal Pain   Emesis    Tonya Hall is a 70 y.o. female.   Abdominal Pain Pain location:  Epigastric Pain quality: cramping and sharp   Pain radiates to:  Does not radiate Pain severity:  Mild Duration:  2 days Timing:  Constant Progression:  Unchanged Relieved by:  Nothing Associated symptoms: fatigue, nausea and vomiting   Associated symptoms: no anorexia, no chest pain, no chills, no diarrhea, no dysuria, no fever, no shortness of breath and no sore throat   Emesis Associated symptoms: abdominal pain and headaches   Associated symptoms: no chills, no diarrhea, no fever and no sore throat    70 year old female presenting to the emergency department with nausea and vomiting with multiple episodes of NBNB emesis all day yesterday and all day today.  Additionally, the patient states that she developed a sudden onset headache 2 days ago, 5 out of 10 in severity, described as diffusely "all over" with no associated blurry vision or other vision changes.  She denies any fevers, chills, neck pain or stiffness.  She denies any chest pain or shortness of breath.  She states that she has been able to keep anything down.  She has been unable to take her home antihypertensives due to her nausea and vomiting.  Past Medical History:  Diagnosis Date   Asthma    Albuterol in haler prn   Cataract    immature unsure which eye   Chronic back pain    COVID-19 October/November 2020   Degenerative disk disease    psoriatic   Diabetes mellitus without complication (HCC)    diet controlled   Esophageal motility disorder    Non-specific, see modified barium study/speech path, BP   Fibromyalgia    GERD (gastroesophageal reflux disease)    takes Protonix bid   Headache 2019   History of blood transfusion    post c-section    History of bronchitis    HTN (hypertension)    Hyperlipidemia    takes Pravastatin daily   Lymphocytic colitis 05/26/2010   Responded to Entocort x 3 MOS   Neuropathy    Nonallergic rhinitis    Osteoporosis    gets Boniva every 3 months   Peripheral edema    takes HCTZ daily   Pneumonia 02/2019   Psoriatic arthritis ([place])    Dr. Katherina Right   Psoriatic arthritis [facility])    bilateral hands   Raynaud's disease    Seasonal allergies    Shingles 2020   SUI (stress urinary incontinence, female)    Urinary urgency     Patient Active Problem List   Diagnosis Date Noted   Foraminal stenosis of lumbar region 08/03/2020   Cervical spondylosis with myelopathy and radiculopathy 03/09/2020   AKI (acute kidney injury) ([place]) 02/07/2019   Acute respiratory failure with hypoxia ([place]) 02/06/2019   Acute respiratory disease due to COVID-19 virus 02/06/2019   Community acquired pneumonia 02/06/2019   Hyponatremia 02/06/2019   Asthma    Diabetes mellitus without complication ([place])    Closed nondisplaced odontoid fracture with type II morphology ([place]) 01/27/2018   Odontoid fracture ([place]) 01/24/2018   Chest pain 11/14/2017   Transaminitis 06/22/2016   Collagenous colitis 05/23/2016   Psoriasis 05/21/2016   High risk medication use 02/15/2016   Age-related osteoporosis without current pathological fracture 02/15/2016  Trochanteric bursitis, left hip 02/15/2016   Sacroiliitis, not elsewhere classified ([place]) 02/15/2016   Chronic pain syndrome 02/15/2016   Abnormal weight gain 06/25/2012   Hypertension 07/03/2011   Hypercholesteremia 07/03/2011   Psoriatic arthritis ([place]) 07/03/2011   Osteoarthritis of multiple joints 07/03/2011   Esophageal motility disorder 02/14/2011   Cough 02/14/2011   GERD 05/05/2010    Past Surgical History:  Procedure Laterality Date   ANTERIOR CERVICAL DECOMP/DISCECTOMY FUSION N/A 03/09/2020   Procedure: ANTERIOR CERVICAL DECOMPRESSION/DISCECTOMY FUSION,  INTERBODY PROSTHESIS, PLATE SCREWS CERVICAL FIVE-SIX, CERVICAL SIX-SEVEN;  Surgeon: Newman Pies, MD;  Location: [facility];  Service: Neurosurgery;  Laterality: N/A;   BACK SURGERY  2003   BIOPSY  04/17/2016   Procedure: BIOPSY;  Surgeon: Danie Binder, MD;  [facility];  Service: Endoscopy;;  random colon bx's   CERVICAL SPINE SURGERY  09/09/2017   CESAREAN SECTION  1974, 1978       COLONOSCOPY  06/19/2008   ZOX:WRUEA internal hemorrhoids/mild sigmoin colon diverticulosis   COLONOSCOPY  2012   Dr. Gala Romney: normal rectum, diverticula, lymphocytic colitis    COLONOSCOPY WITH PROPOFOL N/A 04/17/2016   Dr. Oneida Alar: Normal terminal ileum, internal/external hemorrhoids, random colon biopsies consistent with collagenous colitis   DILATION AND CURETTAGE OF UTERUS  1977   spontaneous abortion   ESOPHAGOGASTRODUODENOSCOPY  06/19/2008   [id number] gastritis   LUMBAR DISC ARTHROPLASTY  7/98   LUMBAR FUSION  6/03, 11/08, 11/14   L4-5 fusion, L2-3 fusion, L1-2   LUMBAR FUSION  08/03/2020   L4-L5   LUMBAR LAMINECTOMY  03/2020   ODONTOID SCREW INSERTION N/A 01/27/2018   Procedure: ODONTOID SCREW INSERTION;  Surgeon: Newman Pies, MD;  Location: [place];  Service: Neurosurgery;  Laterality: N/A;  ODONTOID SCREW INSERTION   TONSILLECTOMY     TONSILLECTOMY AND ADENOIDECTOMY     TUBAL LIGATION       OB History     Gravida  3   Para  2   Term  2   Preterm      AB  1   Living  2      SAB  0   IAB      Ectopic      Multiple      Live Births              Family History  Problem Relation Age of Onset   Diabetes Mother    Pancreatitis Mother    Arthritis Mother        psoriatic    Fibromyalgia Mother    Rheumatic fever Father    Diabetes Other        grandparents   Skin cancer Other        grandfather   Hypertension Other        grandparent   Congestive Heart Failure Other        grandfather   Parkinson's disease Other        grandmother   Colon cancer  Maternal Uncle    Fibromyalgia Sister    Rheum arthritis Sister    Diabetes Sister    Fibromyalgia Sister    Diabetes Sister    Rheum arthritis Sister    Hypertension Son    Colon polyps Neg Hx     Social History   Tobacco Use   Smoking status: Never   Smokeless tobacco: Never  Vaping Use   Vaping Use: Never used  Substance Use Topics   Alcohol use: No   Drug  use: No    Home Medications Prior to Admission medications   Medication Sig Start Date End Date Taking? Authorizing Provider  Apremilast (OTEZLA) 30 MG TABS Take 1 tablet (30 mg total) by mouth 2 (two) times daily. 10/26/20  Yes Ofilia Neas, PA-C  famotidine (PEPCID) 20 MG tablet Take 1 tablet (20 mg total) by mouth daily. 12/21/20  Yes Regan Lemming, MD  omeprazole (PRILOSEC) 20 MG capsule Take 1 capsule (20 mg total) by mouth daily. 12/21/20 01/20/21 Yes Regan Lemming, MD  ondansetron (ZOFRAN ODT) 4 MG disintegrating tablet Take 1 tablet (4 mg total) by mouth every 8 (eight) hours as needed for nausea or vomiting. 12/21/20  Yes Regan Lemming, MD  predniSONE (DELTASONE) 5 MG tablet Take 1 tablet (5 mg total) by mouth daily with breakfast. 09/19/20  Yes Ofilia Neas, PA-C  albuterol (PROVENTIL HFA;VENTOLIN HFA) 108 (90 BASE) MCG/ACT inhaler Inhale 2 puffs into the lungs every 6 (six) hours as needed for wheezing or shortness of breath.     [provider]  ascorbic acid (VITAMIN C) 500 MG tablet Take 500 mg by mouth 2 (two) times daily.    [provider]  aspirin 81 MG tablet Take 81 mg by mouth daily.    [provider]  azelastine (ASTELIN) 0.1 % nasal spray Place 1 spray into both nostrils 2 (two) times daily.  Patient not taking: No sig reported 08/12/19   [provider]  Calcium Carb-Cholecalciferol (CALCIUM 600 + D PO) Take 1 tablet by mouth 2 (two) times daily.     [provider]  cetirizine (ZYRTEC) 10 MG tablet as needed.    [provider]  denosumab  (PROLIA) 60 MG/ML SOSY injection Inject 60 mg into the skin every 6 (six) months.    [provider]  docusate sodium (COLACE) 100 MG capsule Take 1 capsule (100 mg total) by mouth 2 (two) times daily. 08/04/20   Newman Pies, MD  fish oil-omega-3 fatty acids 1000 MG capsule Take 1 g by mouth daily.    [provider]  gabapentin (NEURONTIN) 300 MG capsule Take 600 mg by mouth See admin instructions. Take 600 mg 3 times daily, may take a 4th 300 mg dose as needed for pain 05/05/18   [provider]  Ginger, Zingiber officinalis, (GINGER ROOT) 500 MG CAPS Take 500 mg by mouth daily.    [provider]  Glucosamine Sulfate 1000 MG CAPS Take 2 capsules (2,000 mg total) by mouth daily. 07/03/11   Zenia Resides, MD  HUMULIN N KWIKPEN 100 UNIT/ML Kiwkpen Inject 10 Units into the skin at bedtime. 03/02/20   [provider]  hydrocortisone sodium succinate (SOLU-CORTEF) 100 MG SOLR injection Inject 100 mg into the muscle daily as needed (when unable to keep down prednisone tablet).     [provider]  ipratropium (ATROVENT) 0.06 % nasal spray Place 2 sprays into both nostrils 3 (three) times daily as needed for rhinitis.  07/19/19   [provider]  Ixekizumab (TALTZ) 80 MG/ML SOAJ Inject 80 mg into the skin every 28 (twenty-eight) days. 10/20/20   Ofilia Neas, PA-C  Lancets (ONETOUCH DELICA PLUS [id number]) MISC USE TO TEST 3 TIMESUDAILY. 07/01/19   [provider]  leflunomide (ARAVA) 20 MG tablet TAKE 1 TABLET BY MOUTH ONCE DAILY. 11/08/20   Ofilia Neas, PA-C  losartan (COZAAR) 100 MG tablet Take 100 mg by mouth daily. 12/24/18   [provider]  metoprolol succinate (  TOPROL-XL) 50 MG 24 hr tablet Take 1 tablet (50 mg total) by mouth daily. Take with or immediately following a meal. 07/03/11   Hensel, Jamal Collin, MD  Misc Natural Products (TART CHERRY ADVANCED PO) Take 1 tablet by mouth daily. daily    [provider]   Multiple Vitamin (MULTIVITAMIN) tablet Take 1 tablet by mouth daily.    [provider]  nortriptyline (PAMELOR) 25 MG capsule TAKE (1) CAPSULE BY MOUTH AT BEDTIME. Patient taking differently: Take 25 mg by mouth at bedtime. 12/05/17   Ofilia Neas, PA-C  ONETOUCH VERIO test strip  03/05/18   [provider]  potassium chloride SA (K-DUR,KLOR-CON) 20 MEQ tablet Take 20 mEq by mouth daily.     [provider]  spironolactone (ALDACTONE) 25 MG tablet Take 25 mg by mouth daily.    [provider]  SURE COMFORT INSULIN SYRINGE 31G X 5/16" 0.3 ML MISC  05/09/18   [provider]  SURE COMFORT PEN NEEDLES 31G X 5 MM [place]  05/05/18   [provider]  tetrahydrozoline 0.05 % ophthalmic solution Place 1 drop into both eyes daily as needed (dry eyes). Patient not taking: Reported on 12/12/2020    [provider]  topiramate (TOPAMAX) 200 MG tablet TAKE (1) TABLET BY MOUTH AT BEDTIME. 11/25/20   Bo Merino, MD  Turmeric 500 MG CAPS Take 500 mg by mouth every morning.    [provider]  Glucosamine 500 MG TABS Take 4 tablets by mouth daily.    07/03/11  [provider]  simvastatin (ZOCOR) 40 MG tablet Take 20 mg by mouth at bedtime.   11/19/18  [provider]    Allergies    Adalimumab, Imuran [azathioprine sodium], Methotrexate, Plaquenil [hydroxychloroquine sulfate], Sulfonamide derivatives, Ceftin [cefuroxime axetil], and Morphine  Review of Systems   Review of Systems  Constitutional:  Positive for fatigue. Negative for chills and fever.  HENT:  Negative for sore throat.   Respiratory:  Negative for shortness of breath.   Cardiovascular:  Negative for chest pain.  Gastrointestinal:  Positive for abdominal pain, nausea and vomiting. Negative for anorexia and diarrhea.  Genitourinary:  Negative for dysuria.  Neurological:  Positive for headaches.  All other systems reviewed and are negative.  Physical  Exam Updated Vital Signs BP (!) 163/75   Pulse 93   Temp 98.7 F (37.1 C)   Resp 20   Ht 4\' 11"  (1.499 m)   Wt 59 kg   LMP 02/01/1999   SpO2 100%   BMI 26.26 kg/m   Physical Exam Vitals and nursing note reviewed.  Constitutional:      General: She is not in acute distress.    Appearance: She is well-developed.  HENT:     Head: Normocephalic and atraumatic.  Eyes:     Conjunctiva/sclera: Conjunctivae normal.  Cardiovascular:     Rate and Rhythm: Normal rate and regular rhythm.     Heart sounds: No murmur heard. Pulmonary:     Effort: Pulmonary effort is normal. No respiratory distress.     Breath sounds: Normal breath sounds.  Abdominal:     Palpations: Abdomen is soft.     Tenderness: There is abdominal tenderness in the epigastric area.  Musculoskeletal:     Cervical back: Neck supple.  Skin:    General: Skin is warm and dry.  Neurological:     Mental Status: She is alert.     Comments: MENTAL STATUS EXAM:  Orientation: Alert and oriented to person, place and time.  Memory: Cooperative, follows commands well.  Language: Speech is clear and language is normal.   CRANIAL NERVES:    CN 2 (Optic): Visual fields intact to confrontation.  CN 3,4,6 (EOM): Pupils equal and reactive to light. Full extraocular eye movement without nystagmus.  CN 5 (Trigeminal): Facial sensation is normal, no weakness of masticatory muscles.  CN 7 (Facial): No facial weakness or asymmetry.  CN 8 (Auditory): Auditory acuity grossly normal.  CN 9,10 (Glossophar): The uvula is midline, the palate elevates symmetrically.  CN 11 (spinal access): Normal sternocleidomastoid and trapezius strength.  CN 12 (Hypoglossal): The tongue is midline. No atrophy or fasciculations.Marland Kitchen   MOTOR:  Muscle Strength: 5/5RUE, 5/5LUE, 5/5RLE, 5/5LLE.   COORDINATION:   Intact finger-to-nose, no tremor, no pronator drift.   SENSATION:   Intact to light touch all four extremities.  GAIT: Not assessed     ED  Results / Procedures / Treatments   Labs (all labs ordered are listed, but only abnormal results are displayed) Labs Reviewed  CBC WITH DIFFERENTIAL/PLATELET - Abnormal; Notable for the following components:      Result Value   WBC 13.0 (*)    Neutro Abs 11.7 (*)    Abs Immature Granulocytes 0.10 (*)    All other components within normal limits  COMPREHENSIVE METABOLIC PANEL - Abnormal; Notable for the following components:   Sodium 133 (*)    Potassium 3.4 (*)    CO2 20 (*)    Glucose, Bld 221 (*)    All other components within normal limits  RESP PANEL BY RT-PCR (FLU A&B, COVID) ARPGX2  LIPASE, BLOOD  BRAIN NATRIURETIC PEPTIDE  MAGNESIUM  TROPONIN I (HIGH SENSITIVITY)  TROPONIN I (HIGH SENSITIVITY)    EKG EKG Interpretation  Date/Time:  Wednesday December 21 2020 15:14:46 EDT Ventricular Rate:  113 PR Interval:  150 QRS Duration: 78 QT Interval:  340 QTC Calculation: 466 R Axis:   35 Text Interpretation: Sinus tachycardia Cannot rule out Anterior infarct , age undetermined Abnormal ECG Confirmed by Regan Lemming ([id number]) on 12/21/2020 3:35:19 PM  Radiology CT HEAD WO CONTRAST (5MM)  Result Date: 12/21/2020 CLINICAL DATA:  Headaches with nausea and vomiting for 2 days, initial encounter EXAM: CT HEAD WITHOUT CONTRAST TECHNIQUE: Contiguous axial images were obtained from the base of the skull through the vertex without intravenous contrast. COMPARISON:  05/26/2018 FINDINGS: Brain: No evidence of acute infarction, hemorrhage, hydrocephalus, extra-axial collection or mass lesion/mass effect. Vascular: No hyperdense vessel or unexpected calcification. Skull: Normal. Negative for fracture or focal lesion. Sinuses/Orbits: No acute finding. Other: None. IMPRESSION: No acute intracranial abnormality noted. Electronically Signed   By: Inez Catalina M.D.   On: 12/21/2020 18:43   CT ABDOMEN PELVIS W CONTRAST  Result Date: 12/21/2020 CLINICAL DATA:  Epigastric pain. Hypertension. Nausea  and vomiting yesterday and today. EXAM: CT ABDOMEN AND PELVIS WITH CONTRAST TECHNIQUE: Multidetector CT imaging of the abdomen and pelvis was performed using the standard protocol following bolus administration of intravenous contrast. CONTRAST:  39mL OMNIPAQUE IOHEXOL 350 MG/ML SOLN COMPARISON:  None. FINDINGS: Lower chest: Hypoventilatory atelectasis in the lung bases. The heart is normal in size. There are remote left anterior rib fractures with callus formation. Hepatobiliary: Diffusely decreased hepatic density consistent with steatosis no focal hepatic lesion. No calcified gallstone or pericholecystic fat stranding. No biliary dilatation. Pancreas: No ductal dilatation or inflammation. Spleen: Normal in size without focal abnormality. Adrenals/Urinary Tract: Normal adrenal glands. No hydronephrosis  or perinephric edema. Homogeneous renal enhancement with symmetric excretion on delayed phase imaging. Tiny cortical low densities within both kidneys are too small to characterize but likely small cysts. No solid lesion. No visualized renal stone. Partially distended urinary bladder with circumferential bladder wall thickening. Stomach/Bowel: Detailed bowel assessment is limited in the absence of enteric contrast. Small hiatal hernia. Fluid in the distal stomach with mild mucosal hyperemia. No wall thickening. Unremarkable small bowel without obstruction or inflammation. Normal appendix. The majority of the colon is decompressed and not well assessed. Distal descending and sigmoid diverticulosis without diverticulitis. No pericolonic edema. Vascular/Lymphatic: Aortic atherosclerosis and tortuosity. No aortic aneurysm. Patent portal vein. No portal venous gas. No bulky abdominopelvic adenopathy. Reproductive: Calcification involving the left uterine body may represent fibroid. Low-density in the endometrial canal with possible endometrial fluid or thickening. No adnexal mass. Other: No ascites or free air.  No focal  fluid collection. Musculoskeletal: Extensive posterior lumbar fusion hardware. Remote left superior and inferior pubic rami fractures. No acute osseous abnormalities are seen. IMPRESSION: 1. Fluid in the distal stomach with mild mucosal hyperemia, suggesting gastritis or peptic ulcer disease. Small hiatal hernia. 2. Hepatic steatosis. 3. Low-density in the endometrial canal with possible endometrial fluid or thickening. Recommend nonemergent pelvic ultrasound for characterization on an elective outpatient basis. 4. Colonic diverticulosis without diverticulitis. Aortic Atherosclerosis (ICD10-I70.0). Electronically Signed   By: Keith Rake M.D.   On: 12/21/2020 18:46    Procedures Procedures   Medications Ordered in ED Medications  ondansetron (ZOFRAN) injection 4 mg (4 mg Intravenous Given 12/21/20 1634)  labetalol (NORMODYNE) injection 20 mg (20 mg Intravenous Given 12/21/20 1653)  iohexol (OMNIPAQUE) 350 MG/ML injection 85 mL (85 mLs Intravenous Contrast Given 12/21/20 1725)  potassium chloride SA (KLOR-CON) CR tablet 20 mEq (20 mEq Oral Given 12/21/20 1745)  alum & mag hydroxide-simeth (MAALOX/MYLANTA) 200-200-20 MG/5ML suspension 30 mL (30 mLs Oral Given 12/21/20 1946)    And  lidocaine (XYLOCAINE) 2 % viscous mouth solution 15 mL (15 mLs Oral Given 12/21/20 1946)    ED Course  I have reviewed the triage vital signs and the nursing notes.  Pertinent labs & imaging results that were available during my care of the patient were reviewed by me and considered in my medical decision making (see chart for details).    MDM Rules/Calculators/A&P                           71 year old female presenting to the emergency department with nausea and vomiting with multiple episodes of NBNB emesis all day yesterday and all day today.  Additionally, the patient states that she developed a sudden onset headache 2 days ago, 5 out of 10 in severity, described as diffusely "all over" with no associated blurry  vision or other vision changes.  She denies any fevers, chills, neck pain or stiffness.  She denies any chest pain or shortness of breath.  She states that she has been able to keep anything down.  She has been unable to take her home antihypertensives due to her nausea and vomiting.  Differential diagnosis includes gastritis, pancreatitis, peptic ulcer disease, cholelithiasis, less likely small bowel obstruction, lower suspicion for acute intracranial abnormality, hypertensive emergency, less likely ACS.  On arrival, the patient was afebrile, tachycardic P1 11, hypertensive BP 216/92, saturating well on room air.  The patient had epigastric tenderness to palpation on physical exam.  She has been unable to keep her home antihypertensives down  due to persistent nausea and vomiting today.  We will evaluate further with screening labs, CT imaging of the abdomen and pelvis, CT head.  CT of the head was without acute intracranial abnormality. CT of the abdomen pelvis revealed fluid in the distal stomach with mild mucosal hyperemia, suggesting gastritis or peptic ulcer disease.  A small hiatal hernia was present.  Hepatic steatosis was present.  Colonic diverticulosis without evidence of diverticulitis present.  Fluid within the endometrial canal also present which does not explain the etiology of the patient's symptoms today given all of her symptoms are more epigastric in nature.  Nonemergent pelvic ultrasound outpatient could further evaluate.  The patient's laboratory work-up was largely reassuring with a mild leukocytosis to 13 which is nonspecific, COVID-19 and influenza PCR testing negative, magnesium normal, lipase normal, troponin negative, BNP normal, CMP with mild electrolyte abnormalities, sodium 133, potassium 3.4 which was replenished orally, mildly low bicarb to 20, mild hyperglycemia to 221 without evidence of an anion gap acidosis.  The patient was able to orally rehydrate in the emergency  department after antibiotic emetics.  She was administered IV labetalol given her marked hypertension and symptoms with subsequent improvement.  She was administered Maalox and viscous lidocaine.  Following these interventions, the patient's symptoms completely resolved.  She denied headache, nausea or vomiting.  She was tolerating oral intake.  Her hypotension resolved following a single dose of IV labetalol and as she is now able to tolerate oral intake I suspect she will be able to continue to resume her home antihypertensives.  I advised the patient that her symptoms are most likely consistent with gastritis and/or possible peptic ulcer disease and recommended that she continue to take her home PPI and follow-up outpatient with her PCP and gastroenterologist.  Final Clinical Impression(s) / ED Diagnoses Final diagnoses:  Epigastric pain  Acute gastritis, presence of bleeding unspecified, unspecified gastritis type  Nausea and vomiting, intractability of vomiting not specified, unspecified vomiting type    Rx / DC Orders ED Discharge Orders          Ordered    omeprazole (PRILOSEC) 20 MG capsule  2 times daily before meals,   Status:  Discontinued        12/21/20 1936    famotidine (PEPCID) 20 MG tablet  Daily        12/21/20 1936    ondansetron (ZOFRAN ODT) 4 MG disintegrating tablet  Every 8 hours PRN        12/21/20 1937    omeprazole (PRILOSEC) 20 MG capsule  2 times daily before meals,   Status:  Discontinued        12/21/20 1937    omeprazole (PRILOSEC) 20 MG capsule  Daily        12/21/20 1939             Regan Lemming, MD 12/22/20 1226

## 2020-12-21 NOTE — ED Notes (Signed)
Patient transported to CT 

## 2020-12-21 NOTE — ED Triage Notes (Signed)
Patient was up and about and all of a sudden she felt nauseous and vomiting x 4 all day yesterday and all day today.

## 2020-12-23 DIAGNOSIS — I10 Essential (primary) hypertension: Secondary | ICD-10-CM | POA: Diagnosis not present

## 2020-12-23 DIAGNOSIS — Z981 Arthrodesis status: Secondary | ICD-10-CM | POA: Diagnosis not present

## 2020-12-23 DIAGNOSIS — Z6826 Body mass index (BMI) 26.0-26.9, adult: Secondary | ICD-10-CM | POA: Diagnosis not present

## 2020-12-23 DIAGNOSIS — M48061 Spinal stenosis, lumbar region without neurogenic claudication: Secondary | ICD-10-CM | POA: Diagnosis not present

## 2020-12-26 ENCOUNTER — Other Ambulatory Visit: Payer: Self-pay | Admitting: Physician Assistant

## 2020-12-26 ENCOUNTER — Ambulatory Visit (HOSPITAL_COMMUNITY): Payer: PPO

## 2020-12-26 ENCOUNTER — Other Ambulatory Visit: Payer: Self-pay

## 2020-12-26 DIAGNOSIS — M5412 Radiculopathy, cervical region: Secondary | ICD-10-CM

## 2020-12-26 DIAGNOSIS — M6281 Muscle weakness (generalized): Secondary | ICD-10-CM

## 2020-12-26 DIAGNOSIS — M545 Low back pain, unspecified: Secondary | ICD-10-CM

## 2020-12-26 DIAGNOSIS — M62838 Other muscle spasm: Secondary | ICD-10-CM

## 2020-12-26 DIAGNOSIS — R2689 Other abnormalities of gait and mobility: Secondary | ICD-10-CM

## 2020-12-26 DIAGNOSIS — R29898 Other symptoms and signs involving the musculoskeletal system: Secondary | ICD-10-CM

## 2020-12-26 NOTE — Telephone Encounter (Signed)
Next Visit: 01/18/2021  Last Visit: 10/19/2020  Last Fill: 09/19/2020  Dx: Psoriatic arthritis   Current Dose per office note on 10/19/2020: prednisone 5 mg 1 tablet daily  Okay to refill Prednisone?

## 2020-12-26 NOTE — Therapy (Signed)
[place], [place], [address] Phone: [phone]   Fax:  [phone]  Physical Therapy Treatment  Patient Details  Name: Tonya Hall MRN: [id number] Date of Birth: 28-Aug-1950 Referring Provider (PT): Patricia Nettle NP   Encounter Date: 12/26/2020   PT End of Session - 12/26/20 0923     Visit Number 5    Number of Visits 8    Date for PT Re-Evaluation 01/06/21    Authorization Type healthteam advantage - visits based on med nec. no auth    PT Start Time [phone]   late arrival   PT Stop Time 0945    PT Time Calculation (min) 22 min    Activity Tolerance Patient limited by pain    Behavior During Therapy [facility] for tasks assessed/performed             Past Medical History:  Diagnosis Date   Asthma    Albuterol in haler prn   Cataract    immature unsure which eye   Chronic back pain    COVID-19 October/November 2020   Degenerative disk disease    psoriatic   Diabetes mellitus without complication (HCC)    diet controlled   Esophageal motility disorder    Non-specific, see modified barium study/speech path, BP   Fibromyalgia    GERD (gastroesophageal reflux disease)    takes Protonix bid   Headache 2019   History of blood transfusion    post c-section   History of bronchitis    HTN (hypertension)    Hyperlipidemia    takes Pravastatin daily   Lymphocytic colitis 05/26/2010   Responded to Entocort x 3 MOS   Neuropathy    Nonallergic rhinitis    Osteoporosis    gets Boniva every 3 months   Peripheral edema    takes HCTZ daily   Pneumonia 02/2019   Psoriatic arthritis ([place])    Dr. Katherina Right   Psoriatic arthritis [facility])    bilateral hands   Raynaud's disease    Seasonal allergies    Shingles 2020   SUI (stress urinary incontinence, female)    Urinary urgency     Past Surgical History:  Procedure Laterality Date   ANTERIOR CERVICAL DECOMP/DISCECTOMY FUSION N/A 03/09/2020   Procedure:  ANTERIOR CERVICAL DECOMPRESSION/DISCECTOMY FUSION, INTERBODY PROSTHESIS, PLATE SCREWS CERVICAL FIVE-SIX, CERVICAL SIX-SEVEN;  Surgeon: Newman Pies, MD;  Location: [facility];  Service: Neurosurgery;  Laterality: N/A;   BACK SURGERY  2003   BIOPSY  04/17/2016   Procedure: BIOPSY;  Surgeon: Danie Binder, MD;  [facility];  Service: Endoscopy;;  random colon bx's   CERVICAL SPINE SURGERY  09/09/2017   CESAREAN SECTION  1974, 1978       COLONOSCOPY  06/19/2008   BLT:JQZES internal hemorrhoids/mild sigmoin colon diverticulosis   COLONOSCOPY  2012   Dr. Gala Romney: normal rectum, diverticula, lymphocytic colitis    COLONOSCOPY WITH PROPOFOL N/A 04/17/2016   Dr. Oneida Alar: Normal terminal ileum, internal/external hemorrhoids, random colon biopsies consistent with collagenous colitis   DILATION AND CURETTAGE OF UTERUS  1977   spontaneous abortion   ESOPHAGOGASTRODUODENOSCOPY  06/19/2008   PQZ:[id number] gastritis   LUMBAR DISC ARTHROPLASTY  7/98   LUMBAR FUSION  6/03, 11/08, 11/14   L4-5 fusion, L2-3 fusion, L1-2   LUMBAR FUSION  08/03/2020   L4-L5   LUMBAR LAMINECTOMY  03/2020   ODONTOID SCREW INSERTION N/A 01/27/2018   Procedure: ODONTOID SCREW INSERTION;  Surgeon: Newman Pies, MD;  Location: [facility] OR;  Service: Neurosurgery;  Laterality: N/A;  ODONTOID SCREW INSERTION   TONSILLECTOMY     TONSILLECTOMY AND ADENOIDECTOMY     TUBAL LIGATION      There were no vitals filed for this visit.   Subjective Assessment - 12/26/20 0926     Subjective Not feeling too bad. "I thought my appointment was at 9:15"    Currently in Pain? No/denies    Pain Score 0-No pain    Pain Orientation Lower                [facility] PT Assessment - 12/26/20 0001       Assessment   Medical Diagnosis LBP                           [facility] Adult PT Treatment/Exercise - 12/26/20 0001       Lumbar Exercises: Stretches   Other Lumbar Stretch Exercise wall wash 3x10 for ROM/stretching       Lumbar Exercises: Standing   Heel Raises 10 reps    Heel Raises Limitations + toe raises    Other Standing Lumbar Exercises hamstring curls 10x    Other Standing Lumbar Exercises single leg balance with foot elevated in bottom cabinet 3x R/L                       PT Short Term Goals - 12/15/20 1020       PT SHORT TERM GOAL #1   Title Patient will report at least 25% improvement in overall symptoms and/or function to demonstrate improved functional mobility    Time 3    Period Weeks    Status On-going    Target Date 12/16/20      PT SHORT TERM GOAL #2   Title Patient will be independent in self management strategies to improve quality of life and functional outcomes.    Time 3    Period Weeks    Status On-going    Target Date 12/16/20      PT SHORT TERM GOAL #3   Title Patient will be able to ambulate at least 226 feet wtih cane if needed in 2 minutes to demonstrate improved walking mobility    Baseline 12/15/20 - 80 feet with SPC 2MWT    Time 3    Period Weeks    Status On-going    Target Date 12/16/20               PT Long Term Goals - 12/15/20 1021       PT LONG TERM GOAL #1   Title Patient will mee predicted FOTO score to demonstrate improved overall function.    Time 6    Period Weeks    Status On-going      PT LONG TERM GOAL #2   Title Patient will be able to walk at least 150 feet without assistive device and withonly standign rest breaks to demonstrate improved ambulatory mobility    Time 6    Period Weeks    Status On-going      PT LONG TERM GOAL #3   Title Patient will report at least 50% improvement in overall symptoms and/or function to demonstrate improved functional mobility    Time 6    Period Weeks    Status On-going                   Plan - 12/26/20 8938  Clinical Impression Statement Short session due to late arrival for appointment. Focus on improving single limb stance and postural control with quick onset  of ffatigue requiring seated rest periods after 5-10 reps of ther ex. Continued sessions indicated to progress core strength, dynamic balance, and activity tolerance    Personal Factors and Comorbidities Comorbidity 1;Comorbidity 2;Comorbidity 3+    Comorbidities history of 4 lumbar surgeries, history of cervical fusion    Examination-Activity Limitations Locomotion Level;Lift;Transfers;Stand;Stairs;Bend;Squat;Bed Mobility;Sleep;Sit    Examination-Participation Restrictions Cleaning;Shop;Community Activity;Driving;Meal Prep    Stability/Clinical Decision Making Stable/Uncomplicated    Rehab Potential Poor    PT Frequency Other (comment)   total of 8 visists at 8-5U/DJSH over 6 week certification period   PT Duration 6 weeks    PT Treatment/Interventions ADLs/Self Care Home Management;Aquatic Therapy;Cryotherapy;Electrical Stimulation;Moist Heat;Manual techniques;Therapeutic exercise;Therapeutic activities;Gait training;DME Instruction;Neuromuscular re-education;Patient/family education;Dry needling    PT Next Visit Plan core/hip strength, transition to posture and functional strength when able    PT Home Exercise Plan LAQ, standing tall for one minute 8/30 hip abd/add iso, hamstring iso 9/1 standing hip abd/ ext/marching; 9/15 RTB seated clam, semi-tandem balance    Consulted and Agree with Plan of Care Patient             Patient will benefit from skilled therapeutic intervention in order to improve the following deficits and impairments:  Abnormal gait, Decreased endurance, Decreased strength, Pain, Decreased activity tolerance, Decreased balance, Decreased mobility, Difficulty walking, Decreased range of motion, Postural dysfunction, Improper body mechanics, Decreased knowledge of use of DME  Visit Diagnosis: Low back pain, unspecified back pain laterality, unspecified chronicity, unspecified whether sciatica present  Muscle weakness (generalized)  Other abnormalities of gait and  mobility  Other symptoms and signs involving the musculoskeletal system  Other muscle spasm  Radiculopathy, cervical region     Problem List Patient Active Problem List   Diagnosis Date Noted   Foraminal stenosis of lumbar region 08/03/2020   Cervical spondylosis with myelopathy and radiculopathy 03/09/2020   AKI (acute kidney injury) ([place]) 02/07/2019   Acute respiratory failure with hypoxia ([place]) 02/06/2019   Acute respiratory disease due to COVID-19 virus 02/06/2019   Community acquired pneumonia 02/06/2019   Hyponatremia 02/06/2019   Asthma    Diabetes mellitus without complication ([place])    Closed nondisplaced odontoid fracture with type II morphology ([place]) 01/27/2018   Odontoid fracture ([place]) 01/24/2018   Chest pain 11/14/2017   Transaminitis 06/22/2016   Collagenous colitis 05/23/2016   Psoriasis 05/21/2016   High risk medication use 02/15/2016   Age-related osteoporosis without current pathological fracture 02/15/2016   Trochanteric bursitis, left hip 02/15/2016   Sacroiliitis, not elsewhere classified ([place]) 02/15/2016   Chronic pain syndrome 02/15/2016   Abnormal weight gain 06/25/2012   Hypertension 07/03/2011   Hypercholesteremia 07/03/2011   Psoriatic arthritis ([place]) 07/03/2011   Osteoarthritis of multiple joints 07/03/2011   Esophageal motility disorder 02/14/2011   Cough 02/14/2011   GERD 05/05/2010    Toniann Fail, PT 12/26/2020, 9:51 AM  [facility] [address] [place], [place], [address] Phone: [phone]   Fax:  [phone]  Name: Tonya Hall MRN: [id number] Date of Birth: 1950-09-16

## 2020-12-28 ENCOUNTER — Ambulatory Visit (HOSPITAL_COMMUNITY): Payer: PPO | Admitting: Physical Therapy

## 2020-12-28 ENCOUNTER — Telehealth (HOSPITAL_COMMUNITY): Payer: Self-pay | Admitting: Physical Therapy

## 2020-12-28 NOTE — Telephone Encounter (Signed)
Pt reports she called and cancelled today's appt yesterday due to conflicting dental appointment. Today's miss was not a no show.  Teena Irani, PTA/CLT [phone]

## 2020-12-29 ENCOUNTER — Telehealth: Payer: Self-pay

## 2020-12-29 NOTE — Telephone Encounter (Signed)
Returned call, left message advising that she needs to complete [employer] application to re-enroll for [facility] patient assistance for 2023. Current enrollment expires on 04/01/21.

## 2020-12-29 NOTE — Telephone Encounter (Signed)
Patient left a voicemail stating she received paperwork from [facility] for her [name] medication.  Patient requested a return call to let her know if she needs to fill it out.

## 2020-12-30 ENCOUNTER — Telehealth (HOSPITAL_BASED_OUTPATIENT_CLINIC_OR_DEPARTMENT_OTHER): Payer: Self-pay

## 2020-12-30 NOTE — Telephone Encounter (Signed)
Patient called and states she got a recall for a mammogram. She needs an order for a diagnostic and needs for it to be sent to [facility]. Please order. tbw

## 2020-12-31 ENCOUNTER — Other Ambulatory Visit: Payer: Self-pay | Admitting: Physician Assistant

## 2020-12-31 DIAGNOSIS — L409 Psoriasis, unspecified: Secondary | ICD-10-CM

## 2020-12-31 DIAGNOSIS — L4050 Arthropathic psoriasis, unspecified: Secondary | ICD-10-CM

## 2020-12-31 DIAGNOSIS — Z79899 Other long term (current) drug therapy: Secondary | ICD-10-CM

## 2021-01-02 NOTE — Telephone Encounter (Signed)
Next Visit: 01/18/2021   Last Visit: 10/19/2020   Last Fill: 10/20/2020  Current Dose per office note 10/19/2020:   Labs: 12/21/2020 WBC 13.0, Neutro Abs 11.7, Abs Immature Granulocytes 0.10, Sodium 133, Potassium 3.4, CO2 20, Glucose 221,   TB Gold: 10/19/2020 Neg   Okay to refill [place]?

## 2021-01-03 ENCOUNTER — Ambulatory Visit (HOSPITAL_COMMUNITY): Payer: PPO | Attending: Student

## 2021-01-03 ENCOUNTER — Other Ambulatory Visit: Payer: Self-pay

## 2021-01-03 DIAGNOSIS — R2689 Other abnormalities of gait and mobility: Secondary | ICD-10-CM | POA: Insufficient documentation

## 2021-01-03 DIAGNOSIS — R29898 Other symptoms and signs involving the musculoskeletal system: Secondary | ICD-10-CM | POA: Diagnosis not present

## 2021-01-03 DIAGNOSIS — M545 Low back pain, unspecified: Secondary | ICD-10-CM | POA: Diagnosis not present

## 2021-01-03 DIAGNOSIS — M6281 Muscle weakness (generalized): Secondary | ICD-10-CM | POA: Insufficient documentation

## 2021-01-03 NOTE — Telephone Encounter (Signed)
Patient called back and LMOM for [place] to call her. I have spoken with the patient and let her know that there is no diagnostic needed. Patient states she will give them a call back to schedule the screening mammogram. tbw

## 2021-01-03 NOTE — Therapy (Signed)
**Note De-Identified Tonya Obfuscation** [place], [place], [address] Phone: [phone]   Fax:  [phone]  Physical Therapy Treatment  Patient Details  Name: Tonya Hall MRN: [id number] Date of Birth: 11/04/1950 Referring Provider (PT): Patricia Nettle NP   Encounter Date: 01/03/2021   PT End of Session - 01/03/21 0955     Visit Number 6    Number of Visits 8    Date for PT Re-Evaluation 01/06/21    Authorization Type healthteam advantage - visits based on med nec. no auth    PT Start Time 0955    PT Stop Time 1030    PT Time Calculation (min) 35 min    Activity Tolerance Patient limited by pain    Behavior During Therapy [facility] for tasks assessed/performed             Past Medical History:  Diagnosis Date   Asthma    Albuterol in haler prn   Cataract    immature unsure which eye   Chronic back pain    COVID-19 October/November 2020   Degenerative disk disease    psoriatic   Diabetes mellitus without complication (HCC)    diet controlled   Esophageal motility disorder    Non-specific, see modified barium study/speech path, BP   Fibromyalgia    GERD (gastroesophageal reflux disease)    takes Protonix bid   Headache 2019   History of blood transfusion    post c-section   History of bronchitis    HTN (hypertension)    Hyperlipidemia    takes Pravastatin daily   Lymphocytic colitis 05/26/2010   Responded to Entocort x 3 MOS   Neuropathy    Nonallergic rhinitis    Osteoporosis    gets Boniva every 3 months   Peripheral edema    takes HCTZ daily   Pneumonia 02/2019   Psoriatic arthritis ([place])    Dr. Katherina Right   Psoriatic arthritis [facility])    bilateral hands   Raynaud's disease    Seasonal allergies    Shingles 2020   SUI (stress urinary incontinence, female)    Urinary urgency     Past Surgical History:  Procedure Laterality Date   ANTERIOR CERVICAL DECOMP/DISCECTOMY FUSION N/A 03/09/2020   Procedure: ANTERIOR  CERVICAL DECOMPRESSION/DISCECTOMY FUSION, INTERBODY PROSTHESIS, PLATE SCREWS CERVICAL FIVE-SIX, CERVICAL SIX-SEVEN;  Surgeon: Newman Pies, MD;  Location: [facility];  Service: Neurosurgery;  Laterality: N/A;   BACK SURGERY  2003   BIOPSY  04/17/2016   Procedure: BIOPSY;  Surgeon: Danie Binder, MD;  [facility];  Service: Endoscopy;;  random colon bx's   CERVICAL SPINE SURGERY  09/09/2017   CESAREAN SECTION  1974, 1978       COLONOSCOPY  06/19/2008   BMW:UXLKG internal hemorrhoids/mild sigmoin colon diverticulosis   COLONOSCOPY  2012   Dr. Gala Romney: normal rectum, diverticula, lymphocytic colitis    COLONOSCOPY WITH PROPOFOL N/A 04/17/2016   Dr. Oneida Alar: Normal terminal ileum, internal/external hemorrhoids, random colon biopsies consistent with collagenous colitis   DILATION AND CURETTAGE OF UTERUS  1977   spontaneous abortion   ESOPHAGOGASTRODUODENOSCOPY  06/19/2008   MWN:[id number] gastritis   LUMBAR DISC ARTHROPLASTY  7/98   LUMBAR FUSION  6/03, 11/08, 11/14   L4-5 fusion, L2-3 fusion, L1-2   LUMBAR FUSION  08/03/2020   L4-L5   LUMBAR LAMINECTOMY  03/2020   ODONTOID SCREW INSERTION N/A 01/27/2018   Procedure: ODONTOID SCREW INSERTION;  Surgeon: Newman Pies, MD;  Location: [facility]  OR;  Service: Neurosurgery;  Laterality: N/A;  ODONTOID SCREW INSERTION   TONSILLECTOMY     TONSILLECTOMY AND ADENOIDECTOMY     TUBAL LIGATION      There were no vitals filed for this visit.   Subjective Assessment - 01/03/21 1004     Subjective Still having left buttocks pain but I think it is my bursa    Currently in Pain? Yes    Pain Score 4     Pain Location Hip                [facility] PT Assessment - 01/03/21 0001       Assessment   Medical Diagnosis LBP    Referring Provider (PT) Patricia Nettle NP    Onset Date/Surgical Date 08/03/20                           [facility] Adult PT Treatment/Exercise - 01/03/21 0001       Lumbar Exercises: Standing   Row  Strengthening;20 reps;Theraband    Theraband Level (Row) Level 2 (Red)    Shoulder Extension Strengthening;Both;20 reps;Theraband    Theraband Level (Shoulder Extension) Level 2 (Red)    Other Standing Lumbar Exercises single leg balance with foot elevated in bottom cabinet 3x R/L      Lumbar Exercises: Seated   Long Arc Quad on Chair Strengthening;Both;2 sets;10 reps    Other Seated Lumbar Exercises marching 2x 10                     PT Education - 01/03/21 1032     Education Details dsicussion regarding next visit for D/C to HEP and investigating [employer] program    [employer]) Educated Patient    Methods Explanation    Comprehension Verbalized understanding              PT Short Term Goals - 12/15/20 1020       PT SHORT TERM GOAL #1   Title Patient will report at least 25% improvement in overall symptoms and/or function to demonstrate improved functional mobility    Time 3    Period Weeks    Status On-going    Target Date 12/16/20      PT SHORT TERM GOAL #2   Title Patient will be independent in self management strategies to improve quality of life and functional outcomes.    Time 3    Period Weeks    Status On-going    Target Date 12/16/20      PT SHORT TERM GOAL #3   Title Patient will be able to ambulate at least 226 feet wtih cane if needed in 2 minutes to demonstrate improved walking mobility    Baseline 12/15/20 - 80 feet with SPC 2MWT    Time 3    Period Weeks    Status On-going    Target Date 12/16/20               PT Long Term Goals - 12/15/20 1021       PT LONG TERM GOAL #1   Title Patient will mee predicted FOTO score to demonstrate improved overall function.    Time 6    Period Weeks    Status On-going      PT LONG TERM GOAL #2   Title Patient will be able to walk at least 150 feet without assistive device and withonly standign rest breaks to demonstrate improved  ambulatory mobility    Time 6    Period Weeks     Status On-going      PT LONG TERM GOAL #3   Title Patient will report at least 50% improvement in overall symptoms and/or function to demonstrate improved functional mobility    Time 6    Period Weeks    Status On-going                   Plan - 01/03/21 1032     Clinical Impression Statement Progressing with POC details and demonstrates improved single limb support in standing.  Difficulty with RLE abduction and knee flexion due to weakness vs pain.  Pt reports she would like one additional visit to refine HEP and then to trial independent program at home    Personal Factors and Comorbidities Comorbidity 1;Comorbidity 2;Comorbidity 3+    Comorbidities history of 4 lumbar surgeries, history of cervical fusion    Examination-Activity Limitations Locomotion Level;Lift;Transfers;Stand;Stairs;Bend;Squat;Bed Mobility;Sleep;Sit    Examination-Participation Restrictions Cleaning;Shop;Community Activity;Driving;Meal Prep    Stability/Clinical Decision Making Stable/Uncomplicated    Rehab Potential Poor    PT Frequency Other (comment)   total of 8 visists at 0-7P/XTGG over 6 week certification period   PT Duration 6 weeks    PT Treatment/Interventions ADLs/Self Care Home Management;Aquatic Therapy;Cryotherapy;Electrical Stimulation;Moist Heat;Manual techniques;Therapeutic exercise;Therapeutic activities;Gait training;DME Instruction;Neuromuscular re-education;Patient/family education;Dry needling    PT Next Visit Plan HEP refinement/additions and D/C    PT Home Exercise Plan LAQ, standing tall for one minute 8/30 hip abd/add iso, hamstring iso 9/1 standing hip abd/ ext/marching; 9/15 RTB seated clam, semi-tandem balance    Consulted and Agree with Plan of Care Patient             Patient will benefit from skilled therapeutic intervention in order to improve the following deficits and impairments:  Abnormal gait, Decreased endurance, Decreased strength, Pain, Decreased activity  tolerance, Decreased balance, Decreased mobility, Difficulty walking, Decreased range of motion, Postural dysfunction, Improper body mechanics, Decreased knowledge of use of DME  Visit Diagnosis: Low back pain, unspecified back pain laterality, unspecified chronicity, unspecified whether sciatica present  Muscle weakness (generalized)  Other abnormalities of gait and mobility  Other symptoms and signs involving the musculoskeletal system     Problem List Patient Active Problem List   Diagnosis Date Noted   Foraminal stenosis of lumbar region 08/03/2020   Cervical spondylosis with myelopathy and radiculopathy 03/09/2020   AKI (acute kidney injury) ([place]) 02/07/2019   Acute respiratory failure with hypoxia ([place]) 02/06/2019   Acute respiratory disease due to COVID-19 virus 02/06/2019   Community acquired pneumonia 02/06/2019   Hyponatremia 02/06/2019   Asthma    Diabetes mellitus without complication ([place])    Closed nondisplaced odontoid fracture with type II morphology ([place]) 01/27/2018   Odontoid fracture ([place]) 01/24/2018   Chest pain 11/14/2017   Transaminitis 06/22/2016   Collagenous colitis 05/23/2016   Psoriasis 05/21/2016   High risk medication use 02/15/2016   Age-related osteoporosis without current pathological fracture 02/15/2016   Trochanteric bursitis, left hip 02/15/2016   Sacroiliitis, not elsewhere classified ([place]) 02/15/2016   Chronic pain syndrome 02/15/2016   Abnormal weight gain 06/25/2012   Hypertension 07/03/2011   Hypercholesteremia 07/03/2011   Psoriatic arthritis ([place]) 07/03/2011   Osteoarthritis of multiple joints 07/03/2011   Esophageal motility disorder 02/14/2011   Cough 02/14/2011   GERD 05/05/2010   10:34 AM, 01/03/21 M. Sherlyn Lees, PT, DPT Physical Therapist- [place] Office Number: [phone]   [place]  [facility] [address] [place], [place], [address] Phone: [phone]   Fax:   [phone]  Name: Tonya Hall MRN: [id number] Date of Birth: 1950-05-17

## 2021-01-03 NOTE — Telephone Encounter (Signed)
Called and spoke with radiology who states that the last thing that they have in their records for this patient was from 12/2019. According to their records patient is only do for bilateral screening. No diagnostic.  Unless the patient was seen somewhere else, she does not need diagnostic mammogram.  Tried calling patient to get more details about who contacted her and why she thinks that she needs diagnostic testing. tbw

## 2021-01-04 ENCOUNTER — Other Ambulatory Visit (HOSPITAL_COMMUNITY): Payer: Self-pay | Admitting: Obstetrics & Gynecology

## 2021-01-04 DIAGNOSIS — Z1231 Encounter for screening mammogram for malignant neoplasm of breast: Secondary | ICD-10-CM

## 2021-01-04 NOTE — Progress Notes (Signed)
Office Visit Note  Patient: Tonya Hall             Date of Birth: 12-14-1950           MRN: [id number]             PCP: [name], MD Referring: [name], MD Visit Date: 01/18/2021 Occupation: @GUAROCC @  Subjective:  Pain in both hands   History of Present Illness: Tonya Hall is a 70 y.o. female with history of psoriatic arthritis, osteoarthritis, and DDD.  She remains on taltz 80 mg sq injections every 4 weeks, otezla 30 mg 1 tablet by mouth twice daily, arava 20 mg 1 tablet by mouth daily, and prednisone 5 mg daily.  Patient reports that she has been experiencing increased pain and intermittent swelling in both hands over the past couple of weeks.  She is unsure if her medications are no longer working or if the increased pain and inflammation is due to cooler weather temperatures.  She is also had some increased pain and stiffness in both knee joints but denies any joint swelling.  She reports that on 12/28/2020 she fell neck discomfort progressively worsened as the days went by.  She established care with an [facility]ist at [facility] and had x-rays which revealed a pelvis fracture.  She was strongly encouraged to use a cane or walker on a daily basis.  She has a follow-up appointment scheduled with her orthopedist on 01/23/2021.  She states that she has been having some increased discomfort due to trochanteric bursitis of the left hip as well.  She has been taking hydrocodone as needed for pain relief.  She continues to take tart cherry, turmeric, fish oil, glucosamine, and ginger on a daily basis which she feels has been helpful. Patient reports that she had to miss her dose of Prolia in June due to undergoing a tooth extraction in April which is several complications and delayed healing.  She has not been cleared to resume Prolia injections yet. She presented to urgent care on 12/21/2020 with abdominal pain and nausea.  She was diagnosed with gastritis.  Patient reports  that several days prior to being evaluated in urgent care she had taken several doses of Advil which likely exacerbated her symptoms.  She continues to take prednisone 5 mg daily with food.  She is following up with her gastroenterologist in December.  She was given a prescription for Zofran as well as Pepcid 20 mg daily which have resolved her symptoms.  She is not currently experiencing any abdominal pain, nausea, vomiting, diarrhea, or blood in her stool.     Activities of Daily Living:  Patient reports morning stiffness for all day. Patient Reports nocturnal pain.  Difficulty dressing/grooming: Reports Difficulty climbing stairs: Reports Difficulty getting out of chair: Reports Difficulty using hands for taps, buttons, cutlery, and/or writing: Reports  Review of Systems  Constitutional:  Positive for fatigue.  HENT:  Positive for mouth dryness and nose dryness. Negative for mouth sores.   Eyes:  Positive for pain, itching and dryness.  Respiratory:  Negative for shortness of breath and difficulty breathing.   Cardiovascular:  Negative for chest pain and palpitations.  Gastrointestinal:  Negative for blood in stool, constipation and diarrhea.  Endocrine: Negative for increased urination.  Genitourinary:  Negative for difficulty urinating.  Musculoskeletal:  Positive for joint pain, joint pain, joint swelling, myalgias, morning stiffness, muscle tenderness and myalgias.  Skin:  Positive for color change. Negative for rash and  redness.  Allergic/Immunologic: Negative for susceptible to infections.  Neurological:  Positive for numbness and weakness. Negative for dizziness, headaches and memory loss.  Hematological:  Positive for bruising/bleeding tendency.  Psychiatric/Behavioral:  Negative for confusion.    PMFS History:  Patient Active Problem List   Diagnosis Date Noted   Foraminal stenosis of lumbar region 08/03/2020   Cervical spondylosis with myelopathy and radiculopathy  03/09/2020   AKI (acute kidney injury) ([place]) 02/07/2019   Acute respiratory failure with hypoxia (HCC) 02/06/2019   Acute respiratory disease due to COVID-19 virus 02/06/2019   Community acquired pneumonia 02/06/2019   Hyponatremia 02/06/2019   Asthma    Diabetes mellitus without complication ([place])    Closed nondisplaced odontoid fracture with type II morphology ([place]) 01/27/2018   Odontoid fracture ([place]) 01/24/2018   Chest pain 11/14/2017   Transaminitis 06/22/2016   Collagenous colitis 05/23/2016   Psoriasis 05/21/2016   High risk medication use 02/15/2016   Age-related osteoporosis without current pathological fracture 02/15/2016   Trochanteric bursitis, left hip 02/15/2016   Sacroiliitis, not elsewhere classified ([place]) 02/15/2016   Chronic pain syndrome 02/15/2016   Abnormal weight gain 06/25/2012   Hypertension 07/03/2011   Hypercholesteremia 07/03/2011   Psoriatic arthritis ([place]) 07/03/2011   Osteoarthritis of multiple joints 07/03/2011   Esophageal motility disorder 02/14/2011   Cough 02/14/2011   GERD 05/05/2010    Past Medical History:  Diagnosis Date   Asthma    Albuterol in haler prn   Cataract    immature unsure which eye   Chronic back pain    COVID-19 October/November 2020   Degenerative disk disease    psoriatic   Diabetes mellitus without complication (HCC)    diet controlled   Esophageal motility disorder    Non-specific, see modified barium study/speech path, BP   Fibromyalgia    GERD (gastroesophageal reflux disease)    takes Protonix bid   Headache 2019   History of blood transfusion    post c-section   History of bronchitis    HTN (hypertension)    Hyperlipidemia    takes Pravastatin daily   Lymphocytic colitis 05/26/2010   Responded to Entocort x 3 MOS   Neuropathy    Nonallergic rhinitis    Osteoporosis    gets Boniva every 3 months   Peripheral edema    takes HCTZ daily   Pneumonia 02/2019   Psoriatic arthritis ([place])    Dr.  Katherina Right   Psoriatic arthritis [facility])    bilateral hands   Raynaud's disease    Seasonal allergies    Shingles 2020   SUI (stress urinary incontinence, female)    Urinary urgency     Family History  Problem Relation Age of Onset   Diabetes Mother    Pancreatitis Mother    Arthritis Mother        psoriatic    Fibromyalgia Mother    Rheumatic fever Father    Diabetes Other        grandparents   Skin cancer Other        grandfather   Hypertension Other        grandparent   Congestive Heart Failure Other        grandfather   Parkinson's disease Other        grandmother   Colon cancer Maternal Uncle    Fibromyalgia Sister    Rheum arthritis Sister    Diabetes Sister    Fibromyalgia Sister    Diabetes Sister    Rheum arthritis  Sister    Hypertension Son    Colon polyps Neg Hx    Past Surgical History:  Procedure Laterality Date   ANTERIOR CERVICAL DECOMP/DISCECTOMY FUSION N/A 03/09/2020   Procedure: ANTERIOR CERVICAL DECOMPRESSION/DISCECTOMY FUSION, INTERBODY PROSTHESIS, PLATE SCREWS CERVICAL FIVE-SIX, CERVICAL SIX-SEVEN;  Surgeon: Newman Pies, MD;  Location: [facility];  Service: Neurosurgery;  Laterality: N/A;   BACK SURGERY  2003   BIOPSY  04/17/2016   Procedure: BIOPSY;  Surgeon: Danie Binder, MD;  [facility];  Service: Endoscopy;;  random colon bx's   CERVICAL SPINE SURGERY  09/09/2017   CESAREAN SECTION  1974, 1978       COLONOSCOPY  06/19/2008   YYT:KPTWS internal hemorrhoids/mild sigmoin colon diverticulosis   COLONOSCOPY  2012   Dr. Gala Romney: normal rectum, diverticula, lymphocytic colitis    COLONOSCOPY WITH PROPOFOL N/A 04/17/2016   Dr. Oneida Alar: Normal terminal ileum, internal/external hemorrhoids, random colon biopsies consistent with collagenous colitis   DILATION AND CURETTAGE OF UTERUS  1977   spontaneous abortion   ESOPHAGOGASTRODUODENOSCOPY  06/19/2008   FKC:LEXNTZGY gastritis   LUMBAR DISC ARTHROPLASTY  7/98   LUMBAR FUSION  6/03, 11/08,  11/14   L4-5 fusion, L2-3 fusion, L1-2   LUMBAR FUSION  08/03/2020   L4-L5   LUMBAR LAMINECTOMY  03/2020   ODONTOID SCREW INSERTION N/A 01/27/2018   Procedure: ODONTOID SCREW INSERTION;  Surgeon: Newman Pies, MD;  Location: [facility];  Service: Neurosurgery;  Laterality: N/A;  ODONTOID SCREW INSERTION   TONSILLECTOMY     TONSILLECTOMY AND ADENOIDECTOMY     TUBAL LIGATION     Social History   Social History Narrative   ATTENDS [employer]. 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   Immunization History  Administered Date(s) Administered   Influenza,inj,Quad PF,6+ Mos 01/21/2017   Influenza-Unspecified 01/30/2017   Moderna Sars-Covid-2 Vaccination 06/01/2019, 07/02/2019   Pneumococcal Polysaccharide-23 02/19/2013   Tdap 09/10/2016     Objective: Vital Signs: BP (!) 152/78 (BP Location: Left Arm, Patient Position: Sitting, Cuff Size: Normal)   Pulse 92   Ht 4\' 11"  (1.499 m)   Wt 132 lb 6.4 oz (60.1 kg)   LMP 02/01/1999   BMI 26.74 kg/m    Physical Exam Vitals and nursing note reviewed.  Constitutional:      Appearance: She is well-developed.  HENT:     Head: Normocephalic and atraumatic.  Eyes:     Conjunctiva/sclera: Conjunctivae normal.  Pulmonary:     Effort: Pulmonary effort is normal.  Abdominal:     Palpations: Abdomen is soft.  Musculoskeletal:     Cervical back: Normal range of motion.  Skin:    General: Skin is warm and dry.     Capillary Refill: Capillary refill takes less than 2 seconds.  Neurological:     Mental Status: She is alert and oriented to person, place, and time.  Psychiatric:        Behavior: Behavior normal.     Musculoskeletal Exam: C-spine limited ROM.  Thoracic kyphosis.  Tenderness over the left SI joint and left trochanteric bursa. Shoulder joints and elbow joints have good ROM with no discomfort.  PIP and DIP thickening.  Tenderness and synovitis of the right 3rd and 4th PIP joints.  Right 1st MCP joint tenderness and synovitis noted.   Hip joints difficult to assess in seated position.  Knee joints have good ROM with no warmth or effusion.  Ankle joints have good ROM with no tenderness or joint swelling.  No evidence of achilles tendonitis  or plantar fasciitis.  Some tenderness over MTP joints.   CDAI Exam: CDAI Score: -- Patient Global: --; Provider Global: -- Swollen: 3 ; Tender: 4  Joint Exam 01/18/2021      Right  Left  MCP 1  Swollen Tender     PIP 3  Swollen Tender     PIP 4  Swollen Tender     Sacroiliac      Tender     Investigation: No additional findings.  Imaging: CT HEAD WO CONTRAST (5MM)  Result Date: 12/21/2020 CLINICAL DATA:  Headaches with nausea and vomiting for 2 days, initial encounter EXAM: CT HEAD WITHOUT CONTRAST TECHNIQUE: Contiguous axial images were obtained from the base of the skull through the vertex without intravenous contrast. COMPARISON:  05/26/2018 FINDINGS: Brain: No evidence of acute infarction, hemorrhage, hydrocephalus, extra-axial collection or mass lesion/mass effect. Vascular: No hyperdense vessel or unexpected calcification. Skull: Normal. Negative for fracture or focal lesion. Sinuses/Orbits: No acute finding. Other: None. IMPRESSION: No acute intracranial abnormality noted. Electronically Signed   By: Inez Catalina M.D.   On: 12/21/2020 18:43   CT ABDOMEN PELVIS W CONTRAST  Result Date: 12/21/2020 CLINICAL DATA:  Epigastric pain. Hypertension. Nausea and vomiting yesterday and today. EXAM: CT ABDOMEN AND PELVIS WITH CONTRAST TECHNIQUE: Multidetector CT imaging of the abdomen and pelvis was performed using the standard protocol following bolus administration of intravenous contrast. CONTRAST:  35mL OMNIPAQUE IOHEXOL 350 MG/ML SOLN COMPARISON:  None. FINDINGS: Lower chest: Hypoventilatory atelectasis in the lung bases. The heart is normal in size. There are remote left anterior rib fractures with callus formation. Hepatobiliary: Diffusely decreased hepatic density consistent with  steatosis no focal hepatic lesion. No calcified gallstone or pericholecystic fat stranding. No biliary dilatation. Pancreas: No ductal dilatation or inflammation. Spleen: Normal in size without focal abnormality. Adrenals/Urinary Tract: Normal adrenal glands. No hydronephrosis or perinephric edema. Homogeneous renal enhancement with symmetric excretion on delayed phase imaging. Tiny cortical low densities within both kidneys are too small to characterize but likely small cysts. No solid lesion. No visualized renal stone. Partially distended urinary bladder with circumferential bladder wall thickening. Stomach/Bowel: Detailed bowel assessment is limited in the absence of enteric contrast. Small hiatal hernia. Fluid in the distal stomach with mild mucosal hyperemia. No wall thickening. Unremarkable small bowel without obstruction or inflammation. Normal appendix. The majority of the colon is decompressed and not well assessed. Distal descending and sigmoid diverticulosis without diverticulitis. No pericolonic edema. Vascular/Lymphatic: Aortic atherosclerosis and tortuosity. No aortic aneurysm. Patent portal vein. No portal venous gas. No bulky abdominopelvic adenopathy. Reproductive: Calcification involving the left uterine body may represent fibroid. Low-density in the endometrial canal with possible endometrial fluid or thickening. No adnexal mass. Other: No ascites or free air.  No focal fluid collection. Musculoskeletal: Extensive posterior lumbar fusion hardware. Remote left superior and inferior pubic rami fractures. No acute osseous abnormalities are seen. IMPRESSION: 1. Fluid in the distal stomach with mild mucosal hyperemia, suggesting gastritis or peptic ulcer disease. Small hiatal hernia. 2. Hepatic steatosis. 3. Low-density in the endometrial canal with possible endometrial fluid or thickening. Recommend nonemergent pelvic ultrasound for characterization on an elective outpatient basis. 4. Colonic  diverticulosis without diverticulitis. Aortic Atherosclerosis (ICD10-I70.0). Electronically Signed   By: Keith Rake M.D.   On: 12/21/2020 18:46   MM 3D SCREEN BREAST BILATERAL  Result Date: 01/17/2021 CLINICAL DATA:  Screening. EXAM: DIGITAL SCREENING BILATERAL MAMMOGRAM WITH TOMOSYNTHESIS AND CAD TECHNIQUE: Bilateral screening digital craniocaudal and mediolateral oblique mammograms were obtained. Bilateral screening  digital breast tomosynthesis was performed. The images were evaluated with computer-aided detection. COMPARISON:  Previous exam(s). ACR Breast Density Category c: The breast tissue is heterogeneously dense, which may obscure small masses. FINDINGS: There are no findings suspicious for malignancy. IMPRESSION: No mammographic evidence of malignancy. A result letter of this screening mammogram will be mailed directly to the patient. RECOMMENDATION: Screening mammogram in one year. (Code:SM-B-01Y) BI-RADS CATEGORY  1: Negative. Electronically Signed   By: Claudie Revering M.D.   On: 01/17/2021 12:23    Recent Labs: Lab Results  Component Value Date   WBC 13.0 (H) 12/21/2020   HGB 13.9 12/21/2020   PLT 320 12/21/2020   NA 133 (L) 12/21/2020   K 3.4 (L) 12/21/2020   CL 101 12/21/2020   CO2 20 (L) 12/21/2020   GLUCOSE 221 (H) 12/21/2020   BUN 11 12/21/2020   CREATININE 0.64 12/21/2020   BILITOT 0.5 12/21/2020   ALKPHOS 70 12/21/2020   AST 21 12/21/2020   ALT 24 12/21/2020   PROT 7.6 12/21/2020   ALBUMIN 4.4 12/21/2020   CALCIUM 9.6 12/21/2020   GFRAA 65 06/08/2020   QFTBGOLD Negative 05/05/2015   QFTBGOLDPLUS NEGATIVE 10/19/2020    Speciality Comments: MTX- high LFTs, PLQ,SSZ,Imuran, Humira- allergy, Enbrel , Remicade - inadequate response  Procedures:  No procedures performed Allergies: Adalimumab, Imuran [azathioprine sodium], Methotrexate, Plaquenil [hydroxychloroquine sulfate], Sulfonamide derivatives, Ceftin [cefuroxime axetil], and Morphine   Assessment / Plan:      Visit Diagnoses: Psoriatic arthritis ([place]): She has tenderness and synovitis of the right 1st MCP and right 3rd and 4th PIP joints.  She has been experiencing increased pain and intermittent inflammation in both hands over the past several weeks, which she attributes to cooler weather temperatures.  She remains on taltz 80 mg sq injections every 4 weeks, otezla 30 mg 1 tablet by mouth twice daily, and arava 20 mg 1 tablet by mouth daily.  She is on long term prednisone 5 mg daily.  She has chronic left SI joint pain which has been unchanged.  She has no evidence of Achilles tendinitis or plantar fasciitis.  No active psoriasis at this time.  She has no other joint inflammation at this time.  Overall her psoriatic arthritis seems well controlled on the current treatment regimen.  She was initially started on Taltz on 08/23/2020.  Her medication options are limited and we are unable to increase the dose of prednisone due to uncontrolled type 2 diabetes and osteoporosis with current pelvic fracture.  Previous treatments include methotrexate, Plaquenil, sulfasalazine, Imuran, Humira, Enbrel, Remicade, and Cosentyx.  She is willing to continue the current treatment regimen will notify us if she continues to have recurrent flares with the cooler weather temperatures. She will follow up in 3 months.   Psoriasis: She has no active psoriasis at this time.   High risk medication use - Taltz 80 mg sq injections q 4 weeks, Arava 20mg  1 tablet daily, Otezla 30 mg 1 tablet by mouth twice daily, and prednisone 5 mg 1 tablet daily. CBC and CMP updated on 12/21/20. She will be due to update lab work in December and every 3 months.  Standing orders for CBC and CMP are in place. TB gold negative on 10/19/20. Previous medications: MTX (elevated LFTs), PLQ (rash), SSZ (rash), Imuran (rash), Humira (rash).  Inadequate response: Enbrel, Remicade, and cosentyx  Discussed the importance of holding taltz and arava if she develops  signs or symptoms of an infection and to resume once the infection  has completely cleared.   She is aware of the risks of long term prednisone use.  She was strongly encouraged to take prednisone with food and to avoid all NSAIDs.  Discussed the risk of gastritis and GI perforation while taking prednisone. She has an upcoming appointment with GI in December 2022.   Thumb pain, right: She has some tenderness and synovitis of the right 1st MCP joint.  Her discomfort has improved since having an ultrasound guided cortisone injection on 10/19/20.  Trochanteric bursitis of both hips: She continues to have intermittent discomfort due to recurrent trochanteric bursitis of both hips.  She has tenderness over the left trochanteric bursa on examination today. She is not a good candidate for a cortisone injection at this time due to recent pelvis fracture after a fall on 12/28/20.   Chronic left SI joint pain: Chronic pain.  She had SI joint performed on 08/31/2020. She fell on 12/28/2020 and sustained a pelvic fracture.  [facility] so I do not have x-ray results or office visit notes from that visit.  She has an upcoming appointment with her orthopedist on 01/23/2021 to assess the healing process She was due for her last Prolia injection in June but had to cancel due to complications after tooth extraction in April 2022.  She has not received clearance to resume Prolia yet.  Primary osteoarthritis of right knee: She has good ROM of the right knee joint with no warmth or effusion.  She has noticed some increased pain and stiffness in both knee joints with cooler weather temperatures but has no inflammation on examination today.   DDD (degenerative disc disease), cervical: Chronic pain and stiffness.  She has limited ROM with lateral rotation.   DDD (degenerative disc disease), thoracic: Thoracic kyphosis noted.  DDD (degenerative disc disease), lumbar - S/p fusion performed by  Dr. Arnoldo Morale on 08/03/20. She has been using a cane or walker to assist with ambulation.   Raynaud's syndrome without gangrene: She has been experiencing any increased frequency of symptoms with cooler weather temperatures.  Discussed the importance of keeping her core body temperature warm and wearing gloves and thick socks. No digital ulcerations noted on exam.   Age-related osteoporosis without current pathological fracture: DEXA ordered and followed by Dr. Chalmers Cater. Hx of pelvis fracture.  She remains on long-term prednisone 5 mg daily.  She has recent fall on 12/28/20 and re-fractured her pelvis.  She has upcoming appointment with her orthopedist on 01/23/2021 to assess the healing process.  She was recently switched from Reclast to Prolia due to fragility fractures.  She had to postpone her Prolia injection in June 2022 due to undergoing a tooth extraction in April which has delayed healing.  She has not been cleared to resume Prolia. She is a high risk for repeat falls and for recurrent fractures.  She has been using a cane or walker to assist with ambulation. She will continue to follow up with Dr. Chalmers Cater and her orthopedist.   Chronic pain syndrome - She takes Percocet as needed for pain relief as prescribed at pain management.  Other medical conditions are listed as follows:   Collagenous colitis  Adrenal insufficiency ([place]) - Followed by Dr. Chalmers Cater  Senile purpura [facility])  History of type 2 diabetes mellitus - Followed by Dr. Chalmers Cater.  Essential hypertension  History of gastroesophageal reflux (GERD): Advised to avoid NSAID use.  She remains on Prilosec.  She has an upcoming appointment with GI in December.  Esophageal motility disorder  Orders: No orders of the defined types were placed in this encounter.  No orders of the defined types were placed in this encounter.   Follow-Up Instructions: Return in about 3 months (around 04/20/2021) for Psoriatic arthritis, Osteoarthritis,  DDD.   Ofilia Neas, PA-C  Note - This record has been created using Dragon software.  Chart creation errors have been sought, but may not always  have been located. Such creation errors do not reflect on  the standard of medical care.

## 2021-01-05 ENCOUNTER — Telehealth (HOSPITAL_COMMUNITY): Payer: Self-pay | Admitting: Physical Therapy

## 2021-01-05 ENCOUNTER — Telehealth: Payer: Self-pay

## 2021-01-05 ENCOUNTER — Encounter (HOSPITAL_COMMUNITY): Payer: PPO | Admitting: Physical Therapy

## 2021-01-05 NOTE — Telephone Encounter (Signed)
Patient called stating she thinks she might have fractured her pelvis or hip.  Patient states last week she fell "sat down hard on the ground" and experienced pain in her pelvis. Patient states she fell on  12/28/2020.  Patient states over the next couple of days the pain went away.  Patient states on Sunday, 01/01/21 the pain returned and each day the pain has gotten worse  Patient states the last time she fractured her pelvis Dr. Estanislado Pandy referred her to Dr. Lynann Bologna, but she has retired.  Patient advised she should she orthopedics. Patient states she will call [facility] and see about scheduling an appointment with one of there orthopedist.

## 2021-01-05 NOTE — Telephone Encounter (Signed)
Patient called stating she thinks she might have fractured her pelvis or hip.  Patient states last week she fell "sat down hard on the ground" and experienced pain in her pelvis.  Patient states over the next couple of days the pain went away.  Patient states on Sunday, 01/01/21 the pain returned and each day the pain has gotten worse  Patient requested a return call with recommendations on who to see.  Patient states the last time she fractured her pelvis Dr. Estanislado Pandy referred her to Dr. Lynann Bologna, but she has retired.

## 2021-01-05 NOTE — Telephone Encounter (Signed)
Patient l/m to cx today - she has to see her MD she thinks she has fractured her pelvis.

## 2021-01-06 DIAGNOSIS — M25552 Pain in left hip: Secondary | ICD-10-CM | POA: Diagnosis not present

## 2021-01-12 ENCOUNTER — Ambulatory Visit (HOSPITAL_COMMUNITY)
Admission: RE | Admit: 2021-01-12 | Discharge: 2021-01-12 | Disposition: A | Discharge status: Home / Self Care | Payer: PPO | Source: Ambulatory Visit | Attending: Obstetrics & Gynecology | Admitting: Obstetrics & Gynecology

## 2021-01-12 ENCOUNTER — Other Ambulatory Visit: Payer: Self-pay

## 2021-01-12 DIAGNOSIS — Z1231 Encounter for screening mammogram for malignant neoplasm of breast: Secondary | ICD-10-CM | POA: Diagnosis not present

## 2021-01-18 ENCOUNTER — Encounter: Payer: Self-pay | Admitting: Physician Assistant

## 2021-01-18 ENCOUNTER — Telehealth: Payer: Self-pay | Admitting: Pharmacist

## 2021-01-18 ENCOUNTER — Other Ambulatory Visit: Payer: Self-pay

## 2021-01-18 ENCOUNTER — Ambulatory Visit: Payer: PPO | Admitting: Physician Assistant

## 2021-01-18 VITALS — BP 152/78 | HR 92 | Ht 59.0 in | Wt 132.4 lb

## 2021-01-18 DIAGNOSIS — I10 Essential (primary) hypertension: Secondary | ICD-10-CM

## 2021-01-18 DIAGNOSIS — L409 Psoriasis, unspecified: Secondary | ICD-10-CM | POA: Diagnosis not present

## 2021-01-18 DIAGNOSIS — M79644 Pain in right finger(s): Secondary | ICD-10-CM | POA: Diagnosis not present

## 2021-01-18 DIAGNOSIS — M7062 Trochanteric bursitis, left hip: Secondary | ICD-10-CM

## 2021-01-18 DIAGNOSIS — M5030 Other cervical disc degeneration, unspecified cervical region: Secondary | ICD-10-CM

## 2021-01-18 DIAGNOSIS — M5136 Other intervertebral disc degeneration, lumbar region: Secondary | ICD-10-CM | POA: Diagnosis not present

## 2021-01-18 DIAGNOSIS — K52831 Collagenous colitis: Secondary | ICD-10-CM

## 2021-01-18 DIAGNOSIS — G894 Chronic pain syndrome: Secondary | ICD-10-CM

## 2021-01-18 DIAGNOSIS — M1711 Unilateral primary osteoarthritis, right knee: Secondary | ICD-10-CM | POA: Diagnosis not present

## 2021-01-18 DIAGNOSIS — Z79899 Other long term (current) drug therapy: Secondary | ICD-10-CM

## 2021-01-18 DIAGNOSIS — L4050 Arthropathic psoriasis, unspecified: Secondary | ICD-10-CM

## 2021-01-18 DIAGNOSIS — M533 Sacrococcygeal disorders, not elsewhere classified: Secondary | ICD-10-CM | POA: Diagnosis not present

## 2021-01-18 DIAGNOSIS — I7300 Raynaud's syndrome without gangrene: Secondary | ICD-10-CM

## 2021-01-18 DIAGNOSIS — M810 Age-related osteoporosis without current pathological fracture: Secondary | ICD-10-CM | POA: Diagnosis not present

## 2021-01-18 DIAGNOSIS — M5134 Other intervertebral disc degeneration, thoracic region: Secondary | ICD-10-CM

## 2021-01-18 DIAGNOSIS — Z8719 Personal history of other diseases of the digestive system: Secondary | ICD-10-CM

## 2021-01-18 DIAGNOSIS — E2740 Unspecified adrenocortical insufficiency: Secondary | ICD-10-CM

## 2021-01-18 DIAGNOSIS — K224 Dyskinesia of esophagus: Secondary | ICD-10-CM

## 2021-01-18 DIAGNOSIS — M51369 Other intervertebral disc degeneration, lumbar region without mention of lumbar back pain or lower extremity pain: Secondary | ICD-10-CM

## 2021-01-18 DIAGNOSIS — M7061 Trochanteric bursitis, right hip: Secondary | ICD-10-CM | POA: Diagnosis not present

## 2021-01-18 DIAGNOSIS — D692 Other nonthrombocytopenic purpura: Secondary | ICD-10-CM

## 2021-01-18 DIAGNOSIS — Z8639 Personal history of other endocrine, nutritional and metabolic disease: Secondary | ICD-10-CM

## 2021-01-18 DIAGNOSIS — G8929 Other chronic pain: Secondary | ICD-10-CM

## 2021-01-18 NOTE — Telephone Encounter (Signed)
Patient had OV today and brought along [employer] PAP renewal application for Otezla. Signed patient pages, insurance card copy, med list, and prior auth approval letter collected and placed with last remaining part of application (provider portion)  Provider portion placed in folder to be signed by Hazel Sams, PA-C.  Knox Saliva, PharmD, MPH, BCPS Clinical Pharmacist (Rheumatology and Pulmonology)

## 2021-01-18 NOTE — Patient Instructions (Signed)
Standing Labs We placed an order today for your standing lab work.   Please have your standing labs drawn in December and every 3 months    If possible, please have your labs drawn 2 weeks prior to your appointment so that the provider can discuss your results at your appointment.  Please note that you may see your imaging and lab results in MyChart before we have reviewed them. We may be awaiting multiple results to interpret others before contacting you. Please allow our office up to 72 hours to thoroughly review all of the results before contacting the office for clarification of your results.  We have open lab daily: Monday through Thursday from 1:30-4:30 PM and Friday from 1:30-4:00 PM at the office of Dr. Shaili Deveshwar, [facility].   Please be advised, all patients with office appointments requiring lab work will take precedent over walk-in lab work.  If possible, please come for your lab work on Monday and Friday afternoons, as you may experience shorter wait times. The office is located at [address], [address], [place], [place] [address] No appointment is necessary.   Labs are drawn by [facility]. Please bring your co-pay at the time of your lab draw.  You may receive a bill from [facility] for your lab work.  If you wish to have your labs drawn at another location, please call the office 24 hours in advance to send orders.  If you have any questions regarding directions or hours of operation,  please call [phone].   As a reminder, please drink plenty of water prior to coming for your lab work. Thanks!  

## 2021-01-20 ENCOUNTER — Encounter (HOSPITAL_COMMUNITY): Payer: Self-pay | Admitting: Radiology

## 2021-01-23 NOTE — Telephone Encounter (Signed)
Submitted Patient Assistance Renewal Application to [employer] for [place] along with signed patient and provider portion, PA, med list, and insurance card copy. Will update patient when we receive a response.  Fax# [phone] Phone# [phone]  Knox Saliva, PharmD, MPH, BCPS Clinical Pharmacist (Rheumatology and Pulmonology)

## 2021-01-25 ENCOUNTER — Telehealth: Payer: Self-pay

## 2021-01-25 NOTE — Telephone Encounter (Signed)
Application refaxed per request. Will continue to provide updates in separate ongoing encounter.

## 2021-01-25 NOTE — Telephone Encounter (Signed)
Application refaxed per request. Will continue to f/u

## 2021-01-25 NOTE — Telephone Encounter (Signed)
Environmental manager called regarding patient's application.  Page 2 was cut off and requested it be re-faxed.   Fax [phone]

## 2021-01-26 DIAGNOSIS — S329XXD Fracture of unspecified parts of lumbosacral spine and pelvis, subsequent encounter for fracture with routine healing: Secondary | ICD-10-CM | POA: Diagnosis not present

## 2021-01-27 DIAGNOSIS — S329XXA Fracture of unspecified parts of lumbosacral spine and pelvis, initial encounter for closed fracture: Secondary | ICD-10-CM | POA: Insufficient documentation

## 2021-01-31 NOTE — Telephone Encounter (Signed)
Received a fax from  [place] regarding a renewal application approval for [place] patient assistance from 01/30/21 to 04/01/2022.   Phone number: [phone]  Knox Saliva, PharmD, MPH, BCPS Clinical Pharmacist (Rheumatology and Pulmonology)

## 2021-02-01 ENCOUNTER — Telehealth: Payer: Self-pay | Admitting: Pharmacist

## 2021-02-01 NOTE — Telephone Encounter (Signed)
Received Taltz PAP re-enrollment application from [facility]   Will place provider portion in Hazel Sams, PA-C's, folder to be signed (along with med list, insurance card copy, PA approval letter).   Mailed patient her portion today to be completed and returned to pharmacy team with letter noting that income docs are required.     Knox Saliva, PharmD, MPH, BCPS Clinical Pharmacist (Rheumatology and Pulmonology)

## 2021-02-02 NOTE — Telephone Encounter (Signed)
Received signed provider form for [employer] PAP re-enrollment application for Taltz.  Will place signed provider portion, med list, insurance card copy, and PA approval letter in "PAP pending info" folder in pharmacy office   Awaiting signed patient portion and income docs which was mailed out to home yesterday, 02/01/21   Knox Saliva, PharmD, MPH, BCPS Clinical Pharmacist (Rheumatology and Pulmonology)

## 2021-02-08 ENCOUNTER — Other Ambulatory Visit (HOSPITAL_BASED_OUTPATIENT_CLINIC_OR_DEPARTMENT_OTHER): Payer: Self-pay | Admitting: Obstetrics & Gynecology

## 2021-02-09 ENCOUNTER — Telehealth: Payer: Self-pay

## 2021-02-09 ENCOUNTER — Encounter: Payer: Self-pay | Admitting: *Deleted

## 2021-02-09 NOTE — Telephone Encounter (Signed)
Patient advised we are providing letter for jury duty excise. Patient advised she may come to the office to pick up.

## 2021-02-09 NOTE — Telephone Encounter (Signed)
Patient called requesting a note to excuse her from [place] Duty scheduled for 03/13/21.  Patient states Dr. Estanislado Pandy wrote her a note a couple of years ago permanently excusing her from jury duty, but she cannot find it.

## 2021-02-09 NOTE — Telephone Encounter (Signed)
Ok to provide necessary documentation.

## 2021-02-14 ENCOUNTER — Other Ambulatory Visit: Payer: Self-pay | Admitting: Physician Assistant

## 2021-02-14 NOTE — Telephone Encounter (Signed)
Next Visit: 04/20/2021  Last Visit: 01/18/2021  Last Fill: 11/08/2020  DX: Psoriatic arthritis   Current Dose per office note 01/18/2021: Tonya Hall 20mg  1 tablet daily  Labs: 12/21/2020 WBC 13.0, Neutro Abs 11.7, Abs Immature Granulocytes 0.10, Sodium 133, Potassium 3.4, CO2 20, Glucose 221   Okay to refill Arava?

## 2021-02-15 ENCOUNTER — Ambulatory Visit (INDEPENDENT_AMBULATORY_CARE_PROVIDER_SITE_OTHER): Payer: PPO

## 2021-02-15 ENCOUNTER — Other Ambulatory Visit (HOSPITAL_BASED_OUTPATIENT_CLINIC_OR_DEPARTMENT_OTHER): Payer: Self-pay | Admitting: Obstetrics & Gynecology

## 2021-02-15 ENCOUNTER — Other Ambulatory Visit: Payer: Self-pay

## 2021-02-15 DIAGNOSIS — N83209 Unspecified ovarian cyst, unspecified side: Secondary | ICD-10-CM | POA: Diagnosis not present

## 2021-02-15 NOTE — Progress Notes (Signed)
Us order placed

## 2021-02-21 ENCOUNTER — Other Ambulatory Visit: Payer: Self-pay

## 2021-02-21 ENCOUNTER — Telehealth (HOSPITAL_BASED_OUTPATIENT_CLINIC_OR_DEPARTMENT_OTHER): Payer: Self-pay

## 2021-02-21 DIAGNOSIS — L409 Psoriasis, unspecified: Secondary | ICD-10-CM

## 2021-02-21 MED ORDER — OTEZLA 30 MG PO TABS: 30.0000 mg | ORAL_TABLET | Freq: Two times a day (BID) | ORAL | 0 refills | Status: DC

## 2021-02-21 NOTE — Telephone Encounter (Signed)
Next Visit: 04/20/2021  Last Visit: 01/18/2021  Last Fill: 10/26/2020  DX: Psoriatic arthritis   Current Dose per office note 01/18/2021: otezla 30 mg 1 tablet by mouth twice daily  Labs: 12/21/2020 Sodium 133, Potassium 3.4, CO2 20, Glucose 221, WBC 13.0, Neutro Abs 11.7, Abs Immature Granulocytes 0.10  Okay to refill Otezla?

## 2021-02-21 NOTE — Telephone Encounter (Signed)
Patient called today wanting to know about her ultrasound that she had performed last Wednesday. She states that she has already called one time and someone assured her that Dr. Sabra Heck would give her a call back. Patient would like a phone call back. tbw

## 2021-02-21 NOTE — Telephone Encounter (Signed)
Spoke with patient who states she received the application but has been holding on submitting application since she hasn't had a chance to collect her income documents from upstairs. She plans to submit the application and income documents via mail to [employer]. I advised her to make a copy for herself in case documents get lost in mail. I also encouraged her to call our clinic once she submits her portion so we can submit provider portion of renewal application.  She verbalized understanding  Knox Saliva, PharmD, MPH, BCPS Clinical Pharmacist (Rheumatology and Pulmonology)

## 2021-02-21 NOTE — Telephone Encounter (Signed)
Patient called stating [employer] told her they have no refills for her Otezla medication.  Patient was told Dr. Estanislado Pandy needs to fax a new prescription for [facility] (3 month supply) and to include the following information:  Prescription [phone]  Case [id number]   Fax [phone]

## 2021-02-21 NOTE — Addendum Note (Signed)
Addended by: Carole Binning on: 02/21/2021 04:28 PM   Modules accepted: Orders

## 2021-02-22 ENCOUNTER — Telehealth: Payer: Self-pay | Admitting: Rheumatology

## 2021-02-22 ENCOUNTER — Other Ambulatory Visit: Payer: Self-pay | Admitting: Rheumatology

## 2021-02-22 NOTE — Telephone Encounter (Signed)
Next Visit: 04/20/2021   Last Visit: 01/18/2021   Last Fill: 11/25/2020   Current Dose per office note 01/18/2021: not discussed  Okay to refill Topamax?

## 2021-02-22 NOTE — Telephone Encounter (Signed)
FYI: Patient calling to let you know she mailed [employer] application today.

## 2021-02-28 NOTE — Telephone Encounter (Signed)
Submitted Patient Assistance RENEWAL Application to [employer] for TALTZ along with provider portion, PA, med list, insurance card copy. Patient mailed her portion on 02/22/21 - noted on cover sheet. Will update patient when we receive a response.  Fax# [phone] Phone# [phone] Ref: [id number]

## 2021-03-01 NOTE — Telephone Encounter (Signed)
Noted. Provider portion of [employer] PAP for Taltz submitted. Will f/u in prior telephone encounter.  Knox Saliva, PharmD, MPH, BCPS Clinical Pharmacist (Rheumatology and Pulmonology)

## 2021-03-03 ENCOUNTER — Encounter (HOSPITAL_COMMUNITY): Payer: Self-pay

## 2021-03-03 NOTE — Therapy (Signed)
[facility] [address] [place], [place], [address] Phone: [phone]   Fax:  [phone]  Patient Details  Name: MCKINLEE DUNK MRN: [id number] Date of Birth: 1950/10/09 Referring Provider:  No ref. provider found  Encounter Date: 03/03/2021  PHYSICAL THERAPY DISCHARGE SUMMARY  Visits from Start of Care: 6  Current functional level related to goals / functional outcomes: Patient cancelled remaining visits   Remaining deficits: Unable to determine   Education / Equipment: HEP   Patient agrees to discharge. Patient goals were not met. Patient is being discharged due to not returning since the last visit.   8:19 AM, 03/03/21 M. Sherlyn Lees, PT, DPT Physical Therapist- [facility] Office Number: [phone]   [facility] [address] [place], [place], [address] Phone: [phone]   Fax:  [phone]

## 2021-03-06 NOTE — Telephone Encounter (Signed)
Received fax from [facility] stating that patient's re-enrollment application for TALTZ is missing patient signature, proof of income documentation, and additional information  Patient ID: [id number]  Phone: [phone], option 1, then option 2 Fax: [phone]  Called [employer] to ascertain what information is needed, but estimated hold time was one hour. Will call again later today  Knox Saliva, PharmD, MPH, BCPS Clinical Pharmacist (Rheumatology and Pulmonology)

## 2021-03-10 ENCOUNTER — Encounter: Payer: Self-pay | Admitting: Gastroenterology

## 2021-03-10 ENCOUNTER — Other Ambulatory Visit: Payer: Self-pay

## 2021-03-10 ENCOUNTER — Ambulatory Visit: Payer: PPO | Admitting: Gastroenterology

## 2021-03-10 VITALS — BP 143/74 | HR 102 | Temp 97.0°F | Ht 59.0 in | Wt 131.2 lb

## 2021-03-10 DIAGNOSIS — K219 Gastro-esophageal reflux disease without esophagitis: Secondary | ICD-10-CM | POA: Diagnosis not present

## 2021-03-10 DIAGNOSIS — A09 Infectious gastroenteritis and colitis, unspecified: Secondary | ICD-10-CM | POA: Diagnosis not present

## 2021-03-10 DIAGNOSIS — K224 Dyskinesia of esophagus: Secondary | ICD-10-CM | POA: Diagnosis not present

## 2021-03-10 DIAGNOSIS — K52831 Collagenous colitis: Secondary | ICD-10-CM | POA: Diagnosis not present

## 2021-03-10 DIAGNOSIS — R197 Diarrhea, unspecified: Secondary | ICD-10-CM | POA: Insufficient documentation

## 2021-03-10 NOTE — Patient Instructions (Signed)
Please collect stool studies at [facility]. We will contact you with results as available. Monitor your diarrhea. If becomes more frequent and persistent, please let me know. If your stool test is negative, then we would start you on Entocort if symptoms progress. Monitor your swallowing concerns, if progressive please let  me know.

## 2021-03-10 NOTE — Progress Notes (Signed)
Primary Care Physician: [name], MD  Primary Gastroenterologist:  Elon Alas. Abbey Chatters, DO   Chief Complaint  Patient presents with   change in bowels    Some BMs are more sludge than form, several loose stools during the week    HPI: Tonya Hall is a 70 y.o. female here for follow-up.  Last seen in the office in August 2021.  She has a history of collagenous colitis, GERD, and gastritis.  Originally diagnosed with lymphocytic colitis on colonoscopy in 2012.  Last colonoscopy in 2018, normal terminal ileum, hemorrhoids, random colon biopsies consistent with collagenous colitis.  She has been on low-dose prednisone for years for psoriatic arthritis, unable to be weaned off due to adrenal insufficiency, followed by Dr. Soyla Murphy with endocrinology.  Barium pill esophagram in 2012 showed diffuse impairment of esophageal motility with incomplete clearance of barium by primary peristaltic waves.  Numerous secondary and tertiary waves noted.  Modified barium swallow study in July 2012 showed flash laryngeal penetration of liquid component with swallowing of pills, flash laryngeal penetration without aspiration or cough with thin barium by cup.  She was seen in ED back in September for epigastric pain and vomiting.  Symptoms lasted two days. CT abdomen and pelvis with contrast showed diffusely decreased hepatic density consistent with steatosis.  Gallbladder unremarkable.  Fluid in the distal stomach with mild mucosal hyperemia suggesting gastritis or peptic ulcer disease.  Small hiatal hernia.  Diverticulosis.  Low density in the endometrial canal with possible endometrial fluid or thickening.  He has been evaluated by pelvic ultrasound with no significant findings and no need for endometrial biopsy. Head CT also done due to HA and was negative.   Labs in September with white blood cell count of 13,000, hemoglobin 13.9, platelets 20,000, troponin 6, sodium 133, potassium 3.4, glucose 221, BUN 11,  creatinine 0.64, albumin 4.4, AST 21, ALT 24, alkaline phosphatase 70, total bilirubin 0.5.  After 2 days, her symptoms resolved.  This year has been difficult.  She had neck surgery December 2021.  She had lumbar fusion in May 2022.  Postop day 1 she began having severe pain which lasted for 2 weeks.  Wheelchair bound, crying type pain anytime she had to ambulate to the bathroom.  Eventual MRI showed fracture of L4, due to delay in diagnosis, no intervention available.  Slowly she has recovered.  She subsequently broke her pelvis.  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  Over the past 3 to 4 weeks she has noted increase in her stool frequency and stools have been more loose.  Off-and-on she has some intermittent diarrhea but does not persist.  Often feels like this may be food related.  Over the past 3 weeks she has had more loose stools than usual.  She is still having some solid stool however.  She has had nocturnal diarrhea at least once.  No blood in the stool or melena.  No abdominal pain.  Continues to feel that her diet may be contributing significantly to her bowel pattern especially with recent holiday.  She took doxycycline about 3 weeks ago for bronchitis, while on the antibiotic her stools were looser than usual.  Heartburn is well controlled.  Continues to have some difficulty swallowing mostly with liquids, gets strangled at times.  Solid foods do okay.  She uses plenty of liquids to wash down her medications.  Current Outpatient Medications  Medication Sig Dispense Refill   albuterol (PROVENTIL HFA;VENTOLIN HFA) 108 (90 BASE) MCG/ACT inhaler Inhale 2 puffs into the lungs every 6 (six) hours as needed for wheezing or shortness of breath.      Apremilast (OTEZLA) 30 MG TABS Take 1 tablet (30 mg total) by mouth 2 (two) times daily. 180 tablet 0   ascorbic acid (VITAMIN C) 500 MG tablet Take 500 mg by mouth 2 (two) times daily.      aspirin 81 MG tablet Take 81 mg by mouth daily.     azelastine (ASTELIN) 0.1 % nasal spray Place 1 spray into both nostrils 2 (two) times daily.     Calcium Carb-Cholecalciferol (CALCIUM 600 + D PO) Take 1 tablet by mouth 2 (two) times daily.      cetirizine (ZYRTEC) 10 MG tablet as needed.     denosumab (PROLIA) 60 MG/ML SOSY injection Inject 60 mg into the skin every 6 (six) months.     fish oil-omega-3 fatty acids 1000 MG capsule Take 1 g by mouth daily.     gabapentin (NEURONTIN) 300 MG capsule Take 600 mg by mouth See admin instructions. Take 600 mg 3 times daily, may take a 4th 300 mg dose as needed for pain     Ginger, Zingiber officinalis, (GINGER ROOT) 500 MG CAPS Take 500 mg by mouth daily.     Glucosamine Sulfate 1000 MG CAPS Take 2 capsules (2,000 mg total) by mouth daily.     HUMULIN N KWIKPEN 100 UNIT/ML Kiwkpen Inject 10 Units into the skin at bedtime.     hydrocortisone sodium succinate (SOLU-CORTEF) 100 MG SOLR injection Inject 100 mg into the muscle daily as needed (when unable to keep down prednisone tablet).      ipratropium (ATROVENT) 0.06 % nasal spray Place 2 sprays into both nostrils 3 (three) times daily as needed for rhinitis.      Lancets (ONETOUCH DELICA PLUS [id number]) MISC USE TO TEST 3 TIMESUDAILY.     leflunomide (ARAVA) 20 MG tablet TAKE 1 TABLET BY MOUTH ONCE DAILY. 90 tablet 0   losartan (COZAAR) 100 MG tablet Take 100 mg by mouth daily.     metoprolol succinate (TOPROL-XL) 50 MG 24 hr tablet Take 1 tablet (50 mg total) by mouth daily. Take with or immediately following a meal.     Misc Natural Products (TART CHERRY ADVANCED PO) Take 1 tablet by mouth daily. daily     Multiple Vitamin (MULTIVITAMIN) tablet Take 1 tablet by mouth daily.     nortriptyline (PAMELOR) 25 MG capsule TAKE (1) CAPSULE BY MOUTH AT BEDTIME. (Patient taking differently: Take 25 mg by mouth at bedtime.) 30 capsule 0   omeprazole (PRILOSEC) 20 MG capsule Take 1 capsule (20 mg total) by  mouth daily. (Patient taking differently: Take 20 mg by mouth 2 (two) times daily before a meal.) 30 capsule 0   ondansetron (ZOFRAN ODT) 4 MG disintegrating tablet Take 1 tablet (4 mg total) by mouth every 8 (eight) hours as needed for nausea or vomiting. 20 tablet 0   ONETOUCH VERIO test strip      Polyethyl Glycol-Propyl Glycol (SYSTANE OP) Apply to eye 4 (four) times daily.     potassium chloride SA (K-DUR,KLOR-CON) 20 MEQ tablet Take 20 mEq by mouth daily.      predniSONE (DELTASONE) 5 MG tablet TAKE 1 TABLET BY MOUTH DAILY WITH BREAKFAST 90 tablet 0   spironolactone (ALDACTONE) 25 MG tablet Take 25 mg by mouth  daily.     SURE COMFORT INSULIN SYRINGE 31G X 5/16" 0.3 ML MISC      SURE COMFORT PEN NEEDLES 31G X 5 MM MISC      TALTZ 80 MG/ML SOAJ INJECT 80MG  (1 PEN) UNDER THE SKIN EVERY 4 WEEKS 3 mL 0   tetrahydrozoline 0.05 % ophthalmic solution Place 1 drop into both eyes daily as needed (dry eyes).     topiramate (TOPAMAX) 200 MG tablet TAKE (1) TABLET BY MOUTH AT BEDTIME. 90 tablet 0   Turmeric 500 MG CAPS Take 500 mg by mouth every morning.     No current facility-administered medications for this visit.    Allergies as of 03/10/2021 - Review Complete 03/10/2021  Allergen Reaction Noted   Adalimumab Rash and Other (See Comments)    Imuran [azathioprine sodium] Rash 07/03/2011   Methotrexate Other (See Comments)    Plaquenil [hydroxychloroquine sulfate] Rash 07/03/2011   Sulfonamide derivatives Rash    Ceftin [cefuroxime axetil] Nausea Only 07/06/2014   Morphine Nausea And Vomiting     ROS:  General: Negative for anorexia, weight loss, fever, chills, fatigue, weakness. ENT: Negative for hoarseness,  nasal congestion.  See HPI CV: Negative for chest pain, angina, palpitations, dyspnea on exertion, peripheral edema.  Respiratory: Negative for dyspnea at rest, dyspnea on exertion, cough, sputum, wheezing.  GI: See history of present illness. GU:  Negative for dysuria, hematuria,  urinary incontinence, urinary frequency, nocturnal urination.  Endo: Negative for unusual weight change.    Physical Examination:   BP (!) 143/74   Pulse (!) 102   Temp (!) 97 F (36.1 C)   Ht 4\' 11"  (1.499 m)   Wt 131 lb 3.2 oz (59.5 kg)   LMP 02/01/1999   BMI 26.50 kg/m   General: Well-nourished, well-developed in no acute distress.  Eyes: No icterus. Mouth: masked. Abdomen: Bowel sounds are normal, nontender, nondistended, no hepatosplenomegaly or masses, no abdominal bruits or hernia , no rebound or guarding.   Extremities: No lower extremity edema. No clubbing or deformities. Neuro: Alert and oriented x 4   Skin: Warm and dry, no jaundice.   Psych: Alert and cooperative, normal mood and affect.  Labs:  See HPI  Imaging Studies: US PELVIS TRANSVAGINAL NON-OB (TV ONLY)  Result Date: 02/22/2021 CLINICAL DATA:  Abnormal endometrium on CT scan, no vaginal bleeding  EXAM:  TRANSVAGINAL ULTRASOUND OF PELVIS  TECHNIQUE: Transvaginal ultrasound examination of the pelvis was performed.  FINDINGS: Uterus: 3.72cm x 1.57cm x 3.0cm.  Volume: 9.2ml.  1.45 x 0.83 calcified fibroid right lateral uterus.  Multiple calcifications noted in blood vessels on the peripheral uterus.  Endometrial thickness:  3-51mm with sliver of fluid present  Right ovary:  Not identified.  Left ovary:  Not identified.  Other findings:  No abnormal free fluid.    Assessment:  Diarrhea: History of collagenous colitis, last significant flare around 2019.  She is on low-dose prednisone, 5 mg daily for years which may be partially treating her symptoms.  Given recent antibiotics, would recommend ruling out C. difficile.  Consider other infectious etiologies given her suppressed immune system.  If her stool test is negative and her symptoms progress, she may require another short course of Entocort.  In the interim she can use low-dose Imodium.  GERD: Well-controlled on omeprazole 20 mg twice daily.  Esophageal  dysmotility: Notices symptoms more than in the past but remains somewhat infrequent.  We will continue to monitor.  Denies significant solid food dysphagia making esophageal  stricture less likely.  Recent abdominal pain associated with vomiting: Symptoms occurred for 2 days.  CT findings with questionable gastritis versus peptic ulcer disease.  She has been on twice daily PPI chronically.  Labs reassuring.  Monitor for now.  We will discussed CT findings with Dr. Abbey Chatters, not clear that she would need endoscopy at this time.   Plan: Send stool for GI profile. She will call if her diarrhea worsens, becomes more frequent and persistent.  If her stool test is negative and her symptoms progress, we may need to try a course of Entocort for possible collagenous colitis flare.  In the interim she can use low-dose Imodium, half a tablet up to twice a day to control stools. Continue to monitor swallowing concerns. It may be a progression of her esophageal dysmotility but may benefit from repeat upper endoscopy if symptoms progress.

## 2021-03-14 DIAGNOSIS — M810 Age-related osteoporosis without current pathological fracture: Secondary | ICD-10-CM | POA: Diagnosis not present

## 2021-03-14 NOTE — Telephone Encounter (Signed)
Called [employer] for update on patient's Taltz renewal PAP application. They confirmed that they have provider portion on file  Per rep, patient's portion of application is missing. She mailed her portion on 03/03/21, and per rep, it can take 2 weeks to be received and then one week to be processed once received.  Knox Saliva, PharmD, MPH, BCPS Clinical Pharmacist (Rheumatology and Pulmonology)

## 2021-03-18 ENCOUNTER — Other Ambulatory Visit: Payer: Self-pay | Admitting: Physician Assistant

## 2021-03-18 DIAGNOSIS — L409 Psoriasis, unspecified: Secondary | ICD-10-CM

## 2021-03-18 DIAGNOSIS — Z79899 Other long term (current) drug therapy: Secondary | ICD-10-CM

## 2021-03-18 DIAGNOSIS — L4050 Arthropathic psoriasis, unspecified: Secondary | ICD-10-CM

## 2021-03-20 NOTE — Telephone Encounter (Signed)
Next Visit: 04/20/2021  Last Visit: 01/18/2021  Last Fill: 01/02/2021  [id number] arthritis   Current Dose per office note 01/18/2021: Taltz 80 mg sq injections q 4 weeks  Labs: 12/21/2020 WBC 13.0 Neutro Abs 11.7, Abs Immature Granulocytes 0.10, Sodium 133, Potassium 3.4, CO2 20 Glucose 221,   TB Gold: 10/19/2020 Neg    Patient to update labs while in the office tomorrow for an injection.   Okay to refill Taltz?

## 2021-03-20 NOTE — Progress Notes (Addendum)
Office Visit Note  Patient: Tonya Hall             Date of Birth: 12-Jun-1950           MRN: [id number]             PCP: [name], MD Referring: [name], MD Visit Date: 03/21/2021 Occupation: @GUAROCC @  Subjective:  Left hip pain.   History of Present Illness: Tonya Hall is a 70 y.o. female with a history of psoriatic arthritis, psoriasis, osteoarthritis, degenerative disc disease and osteoporosis.  She states she continues to have some discomfort in her hands.  She has noticed improvement in her joint symptoms on the combination therapy.  She has been experiencing pain in her left SI joint and left trochanteric bursa.  She believes the pain from the left trochanteric bursa is causing the left SI joint discomfort.  She was off Prolia as she was getting some dental work.  She states she resumed Prolia last Tuesday.  She had this questionable lesion in her mouth for which she will be seeing an oral surgeon.  Patient has noticed increased pedal edema and periorbital edema.  She would like to be referred to cardiology for evaluation.  Activities of Daily Living:  Patient reports morning stiffness for all day.   Patient Reports nocturnal pain.  Difficulty dressing/grooming: Reports Difficulty climbing stairs: Reports Difficulty getting out of chair: Reports Difficulty using hands for taps, buttons, cutlery, and/or writing: Reports  Review of Systems  Constitutional:  Positive for fatigue. Negative for night sweats, weight gain and weight loss.  HENT:  Positive for mouth dryness and nose dryness. Negative for mouth sores, trouble swallowing and trouble swallowing.   Eyes:  Positive for dryness. Negative for pain, redness, itching and visual disturbance.  Respiratory:  Negative for cough, shortness of breath and difficulty breathing.   Cardiovascular:  Negative for chest pain, palpitations, hypertension, irregular heartbeat and swelling in legs/feet.  Gastrointestinal:   Negative for blood in stool, constipation and diarrhea.  Endocrine: Negative for increased urination.  Genitourinary:  Negative for difficulty urinating and vaginal dryness.  Musculoskeletal:  Positive for joint pain, joint pain, joint swelling, myalgias, morning stiffness, muscle tenderness and myalgias. Negative for muscle weakness.  Skin:  Positive for color change. Negative for rash, hair loss, redness, skin tightness, ulcers and sensitivity to sunlight.  Allergic/Immunologic: Negative for susceptible to infections.  Neurological:  Positive for numbness and weakness. Negative for dizziness, headaches, memory loss and night sweats.  Hematological:  Positive for bruising/bleeding tendency. Negative for swollen glands.  Psychiatric/Behavioral:  Negative for depressed mood, confusion and sleep disturbance. The patient is not nervous/anxious.    PMFS History:  Patient Active Problem List   Diagnosis Date Noted   Diarrhea 03/10/2021   Foraminal stenosis of lumbar region 08/03/2020   Cervical spondylosis with myelopathy and radiculopathy 03/09/2020   AKI (acute kidney injury) ([place]) 02/07/2019   Acute respiratory failure with hypoxia ([place]) 02/06/2019   Acute respiratory disease due to COVID-19 virus 02/06/2019   Community acquired pneumonia 02/06/2019   Hyponatremia 02/06/2019   Asthma    Diabetes mellitus without complication ([place])    Closed nondisplaced odontoid fracture with type II morphology ([place]) 01/27/2018   Odontoid fracture ([place]) 01/24/2018   Chest pain 11/14/2017   Transaminitis 06/22/2016   Collagenous colitis 05/23/2016   Psoriasis 05/21/2016   High risk medication use 02/15/2016   Age-related osteoporosis without current pathological fracture 02/15/2016   Trochanteric bursitis, left hip 02/15/2016  Sacroiliitis, not elsewhere classified ([place]) 02/15/2016   Chronic pain syndrome 02/15/2016   Abnormal weight gain 06/25/2012   Hypertension 07/03/2011   Hypercholesteremia  07/03/2011   Psoriatic arthritis ([place]) 07/03/2011   Osteoarthritis of multiple joints 07/03/2011   Esophageal motility disorder 02/14/2011   Cough 02/14/2011   GERD 05/05/2010    Past Medical History:  Diagnosis Date   Asthma    Albuterol in haler prn   Cataract    immature unsure which eye   Chronic back pain    COVID-19 October/November 2020   Degenerative disk disease    psoriatic   Diabetes mellitus without complication (HCC)    diet controlled   Esophageal motility disorder    Non-specific, see modified barium study/speech path, BP   Fibromyalgia    GERD (gastroesophageal reflux disease)    takes Protonix bid   Headache 2019   History of blood transfusion    post c-section   History of bronchitis    HTN (hypertension)    Hyperlipidemia    takes Pravastatin daily   Lymphocytic colitis 05/26/2010   Responded to Entocort x 3 MOS   Neuropathy    Nonallergic rhinitis    Osteoporosis    gets Boniva every 3 months   Peripheral edema    takes HCTZ daily   Pneumonia 02/2019   Psoriatic arthritis ([place])    Dr. Katherina Right   Psoriatic arthritis ([place])    bilateral hands   Raynaud's disease    Seasonal allergies    Shingles 2020   SUI (stress urinary incontinence, female)    Urinary urgency     Family History  Problem Relation Age of Onset   Diabetes Mother    Pancreatitis Mother    Arthritis Mother        psoriatic    Fibromyalgia Mother    Rheumatic fever Father    Diabetes Other        grandparents   Skin cancer Other        grandfather   Hypertension Other        grandparent   Congestive Heart Failure Other        grandfather   Parkinson's disease Other        grandmother   Colon cancer Maternal Uncle    Fibromyalgia Sister    Rheum arthritis Sister    Diabetes Sister    Fibromyalgia Sister    Diabetes Sister    Rheum arthritis Sister    Hypertension Son    Colon polyps Neg Hx    Past Surgical History:  Procedure Laterality Date   ANTERIOR  CERVICAL DECOMP/DISCECTOMY FUSION N/A 03/09/2020   Procedure: ANTERIOR CERVICAL DECOMPRESSION/DISCECTOMY FUSION, INTERBODY PROSTHESIS, PLATE SCREWS CERVICAL FIVE-SIX, CERVICAL SIX-SEVEN;  Surgeon: Newman Pies, MD;  Location: [facility];  Service: Neurosurgery;  Laterality: N/A;   BACK SURGERY  2003   BIOPSY  04/17/2016   Procedure: BIOPSY;  Surgeon: Danie Binder, MD;  [facility];  Service: Endoscopy;;  random colon bx's   CERVICAL SPINE SURGERY  09/09/2017   CESAREAN SECTION  1974, 1978       COLONOSCOPY  06/19/2008   TKW:IOXBD internal hemorrhoids/mild sigmoin colon diverticulosis   COLONOSCOPY  2012   Dr. Gala Romney: normal rectum, diverticula, lymphocytic colitis    COLONOSCOPY WITH PROPOFOL N/A 04/17/2016   Dr. Oneida Alar: Normal terminal ileum, internal/external hemorrhoids, random colon biopsies consistent with collagenous colitis   [place]   spontaneous abortion  ESOPHAGOGASTRODUODENOSCOPY  06/19/2008   LFY:BOFBPZWC gastritis   LUMBAR DISC ARTHROPLASTY  7/98   LUMBAR FUSION  6/03, 11/08, 11/14   L4-5 fusion, L2-3 fusion, L1-2   LUMBAR FUSION  08/03/2020   L4-L5   LUMBAR LAMINECTOMY  03/2020   ODONTOID SCREW INSERTION N/A 01/27/2018   Procedure: ODONTOID SCREW INSERTION;  Surgeon: Newman Pies, MD;  Location: [facility];  Service: Neurosurgery;  Laterality: N/A;  ODONTOID SCREW INSERTION   TONSILLECTOMY     TONSILLECTOMY AND ADENOIDECTOMY     TUBAL LIGATION     Social History   Social History Narrative   ATTENDS [employer]. 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   Immunization History  Administered Date(s) Administered   Influenza,inj,Quad PF,6+ Mos 01/21/2017   Influenza-Unspecified 01/30/2017   Moderna Sars-Covid-2 Vaccination 06/01/2019, 07/02/2019   Pneumococcal Polysaccharide-23 02/19/2013   Tdap 09/10/2016     Objective: Vital Signs: BP (!) 149/80 (BP Location: Left Arm, Patient Position: Sitting, Cuff Size: Normal)    Pulse  (!) 101    Ht 4\' 11"  (1.499 m)    Wt 133 lb 9.6 oz (60.6 kg)    LMP 02/01/1999    BMI 26.98 kg/m    Physical Exam Vitals and nursing note reviewed.  Constitutional:      Appearance: She is well-developed.  HENT:     Head: Normocephalic and atraumatic.  Eyes:     Conjunctiva/sclera: Conjunctivae normal.     Comments: Periorbital edema was noted.  Cardiovascular:     Rate and Rhythm: Normal rate and regular rhythm.     Heart sounds: Normal heart sounds.  Pulmonary:     Effort: Pulmonary effort is normal.     Breath sounds: Normal breath sounds.  Abdominal:     General: Bowel sounds are normal.     Palpations: Abdomen is soft.  Musculoskeletal:     Cervical back: Normal range of motion.     Right lower leg: Edema present.     Left lower leg: Edema present.  Lymphadenopathy:     Cervical: No cervical adenopathy.  Skin:    General: Skin is warm and dry.     Capillary Refill: Capillary refill takes less than 2 seconds.  Neurological:     Mental Status: She is alert and oriented to person, place, and time.  Psychiatric:        Behavior: Behavior normal.     Musculoskeletal Exam: She had limited lateral rotation of her cervical spine.  Thoracic kyphosis was noted.  She had limited range of motion of her lumbar spine.  She is some tenderness over left SI joint.  She had tenderness over left trochanteric bursa.  Elbow joints and wrist joints with good range of motion.  She had bilateral PIP and DIP thickening with no synovitis on palpation today.  She had limited extension of her PIP and DIP joints.  Hip joints with good range of motion.  Knee joints in good range of motion without any warmth swelling or effusion.  She had no tenderness over ankles or MTPs.  CDAI Exam: CDAI Score: -- Patient Global: --; Provider Global: -- Swollen: --; Tender: -- Joint Exam 03/21/2021   No joint exam has been documented for this visit   There is currently no information documented on the  homunculus. Go to the Rheumatology activity and complete the homunculus joint exam.  Investigation: No additional findings.  Imaging: No results found.  Recent Labs: Lab Results  Component Value Date   WBC 13.0 (H) 12/21/2020  HGB 13.9 12/21/2020   PLT 320 12/21/2020   NA 133 (L) 12/21/2020   K 3.4 (L) 12/21/2020   CL 101 12/21/2020   CO2 20 (L) 12/21/2020   GLUCOSE 221 (H) 12/21/2020   BUN 11 12/21/2020   CREATININE 0.64 12/21/2020   BILITOT 0.5 12/21/2020   ALKPHOS 70 12/21/2020   AST 21 12/21/2020   ALT 24 12/21/2020   PROT 7.6 12/21/2020   ALBUMIN 4.4 12/21/2020   CALCIUM 9.6 12/21/2020   GFRAA 65 06/08/2020   QFTBGOLD Negative 05/05/2015   QFTBGOLDPLUS NEGATIVE 10/19/2020    Speciality Comments: MTX- high LFTs, PLQ,SSZ,Imuran, Humira- allergy, Enbrel , Remicade - inadequate response  Procedures:  Large Joint Inj: L greater trochanter on 03/21/2021 10:47 AM Indications: pain Details: 27 G 1.5 in needle, lateral approach  Arthrogram: No  Medications: 1.5 mL lidocaine 1 %; 40 mg triamcinolone acetonide 40 MG/ML Aspirate: 0 mL Outcome: tolerated well, no immediate complications Procedure, treatment alternatives, risks and benefits explained, specific risks discussed. Consent was given by the patient. Immediately prior to procedure a time out was called to verify the correct patient, procedure, equipment, support staff and site/side marked as required. Patient was prepped and draped in the usual sterile fashion.    Allergies: Adalimumab, Imuran [azathioprine sodium], Methotrexate, Plaquenil [hydroxychloroquine sulfate], Sulfonamide derivatives, Ceftin [cefuroxime axetil], and Morphine   Assessment / Plan:     Visit Diagnoses: Psoriatic arthritis (HCC)-she has severe psoriatic arthritis.  She gives history of intermittent swelling.  No synovitis was noted today.  She has synovial thickening limited range of motion and subluxation of several of the PIPs and DIPs.   The combination of Taltz, leflunomide Otezla and low-dose prednisone has been working well for her.  She has noted improvement in her symptoms since she switched to [place].  She has been experiencing left trochanteric bursa and left SI joint pain.  Psoriasis-she had no active psoriasis lesions.  High risk medication use - Taltz 80 mg sq injections q 4 weeks, Arava 20mg  1 tablet daily, Otezla 30 mg 1 tablet by mouth twice daily, and prednisone 5 mg 1 tablet daily.  She is unable to taper prednisone.  She has been on prednisone for many years.-(She had side effects from methotrexate, Plaquenil, sulfasalazine, Imuran, Humira.  She had inadequate response to Enbrel, Remicade and Cosentyx.).  Labs from December 21, 2020 showed elevated WBC count most likely due to prednisone use.  CMP was normal.  TB gold was negative on October 27, 2020.  We will get labs today and then every 3 months to monitor for drug toxicity.  Plan: CBC with Differential/Platelet, COMPLETE METABOLIC PANEL WITH GFR.  Information about immunization was placed in the AVS.  She was also advised to stop Taltz, leflunomide and Otezla in case she develops an infection.  She may resume the medications once infection resolves.  Trochanteric bursitis of both hips-she is off-and-on discomfort in her trochanteric bursa.  Her left trochanteric bursa was very painful today.  After informed consent was obtained left trochanteric bursa was prepped in sterile fashion and injected with cortisone as described above.  IT band stretches were emphasized.  Chronic left SI joint pain - She had SI joint performed on 08/31/2020.She fell on 12/28/2020 and sustained a pelvic fracture.  She continues to have SI joint discomfort.  Stretching exercises were encouraged.  Primary osteoarthritis of right knee-doing well currently.  DDD (degenerative disc disease), cervical-she had limited range of motion without discomfort.  DDD (degenerative disc disease), thoracic -  Thoracic kyphosis noted.  DDD (degenerative disc disease), lumbar - S/p fusion performed by Dr. Arnoldo Morale on 08/03/20.  She has limited range of motion with off-and-on discomfort.  Raynaud's syndrome without gangrene-she states the symptoms are mild and mostly in her toes.  Protective clothing helps.  Age-related osteoporosis without current pathological fracture - DEXA ordered and followed by Dr. Chalmers Cater. Hx of pelvis fracture.She is on Prolia SQ. patient was recently seen by the dentist and there is a question about ONJ.  Patient is upset and has an appointment coming up with the oral surgeon.  Chronic pain syndrome - She takes Percocet as needed for pain relief as prescribed at pain management.  Adrenal insufficiency (HCC) - Followed by Dr. Luanne Bras edema-patient has noticed increased edema recently.  Periorbital edema-patient has noticed increased fullness around her eyes recently.  Per her request we will repeat make a cardiology referral.  Collagenous colitis  Esophageal motility disorder  Essential hypertension  History of type 2 diabetes mellitus - Followed by Dr. Chalmers Cater.  History of gastroesophageal reflux (GERD)  Senile purpura ([place])  Orders: Orders Placed This Encounter  Procedures   CBC with Differential/Platelet   COMPLETE METABOLIC PANEL WITH GFR   No orders of the defined types were placed in this encounter.    Follow-Up Instructions: Return in about 3 months (around 06/19/2021) for Psoriatic arthritis, Osteoarthritis.   Bo Merino, MD  Note - This record has been created using Editor, commissioning.  Chart creation errors have been sought, but may not always  have been located. Such creation errors do not reflect on  the standard of medical care.

## 2021-03-20 NOTE — Telephone Encounter (Signed)
Received a fax from  [facility] regarding an approval for [facility] patient assistance from 03/02/22 to 04/01/22.   Phone number: [phone]  Knox Saliva, PharmD, MPH, BCPS Clinical Pharmacist (Rheumatology and Pulmonology)

## 2021-03-21 ENCOUNTER — Other Ambulatory Visit: Payer: Self-pay

## 2021-03-21 ENCOUNTER — Ambulatory Visit: Payer: PPO | Admitting: Rheumatology

## 2021-03-21 ENCOUNTER — Telehealth: Payer: Self-pay | Admitting: *Deleted

## 2021-03-21 ENCOUNTER — Encounter: Payer: Self-pay | Admitting: Rheumatology

## 2021-03-21 VITALS — BP 149/80 | HR 101 | Ht 59.0 in | Wt 133.6 lb

## 2021-03-21 DIAGNOSIS — G8929 Other chronic pain: Secondary | ICD-10-CM

## 2021-03-21 DIAGNOSIS — L409 Psoriasis, unspecified: Secondary | ICD-10-CM

## 2021-03-21 DIAGNOSIS — M7062 Trochanteric bursitis, left hip: Secondary | ICD-10-CM | POA: Diagnosis not present

## 2021-03-21 DIAGNOSIS — Z79899 Other long term (current) drug therapy: Secondary | ICD-10-CM

## 2021-03-21 DIAGNOSIS — G894 Chronic pain syndrome: Secondary | ICD-10-CM | POA: Diagnosis not present

## 2021-03-21 DIAGNOSIS — K224 Dyskinesia of esophagus: Secondary | ICD-10-CM

## 2021-03-21 DIAGNOSIS — R600 Localized edema: Secondary | ICD-10-CM

## 2021-03-21 DIAGNOSIS — I7300 Raynaud's syndrome without gangrene: Secondary | ICD-10-CM | POA: Diagnosis not present

## 2021-03-21 DIAGNOSIS — M533 Sacrococcygeal disorders, not elsewhere classified: Secondary | ICD-10-CM

## 2021-03-21 DIAGNOSIS — M7061 Trochanteric bursitis, right hip: Secondary | ICD-10-CM | POA: Diagnosis not present

## 2021-03-21 DIAGNOSIS — Z8719 Personal history of other diseases of the digestive system: Secondary | ICD-10-CM

## 2021-03-21 DIAGNOSIS — I10 Essential (primary) hypertension: Secondary | ICD-10-CM

## 2021-03-21 DIAGNOSIS — M5030 Other cervical disc degeneration, unspecified cervical region: Secondary | ICD-10-CM

## 2021-03-21 DIAGNOSIS — M5134 Other intervertebral disc degeneration, thoracic region: Secondary | ICD-10-CM

## 2021-03-21 DIAGNOSIS — M810 Age-related osteoporosis without current pathological fracture: Secondary | ICD-10-CM

## 2021-03-21 DIAGNOSIS — M5136 Other intervertebral disc degeneration, lumbar region: Secondary | ICD-10-CM

## 2021-03-21 DIAGNOSIS — E2740 Unspecified adrenocortical insufficiency: Secondary | ICD-10-CM

## 2021-03-21 DIAGNOSIS — L4050 Arthropathic psoriasis, unspecified: Secondary | ICD-10-CM

## 2021-03-21 DIAGNOSIS — Z8639 Personal history of other endocrine, nutritional and metabolic disease: Secondary | ICD-10-CM

## 2021-03-21 DIAGNOSIS — D692 Other nonthrombocytopenic purpura: Secondary | ICD-10-CM

## 2021-03-21 DIAGNOSIS — M1711 Unilateral primary osteoarthritis, right knee: Secondary | ICD-10-CM

## 2021-03-21 DIAGNOSIS — K52831 Collagenous colitis: Secondary | ICD-10-CM

## 2021-03-21 LAB — COMPLETE METABOLIC PANEL WITH GFR
AG Ratio: 1.6 (calc) (ref 1.0–2.5)
ALT: 25 U/L (ref 6–29)
AST: 20 U/L (ref 10–35)
Albumin: 3.9 g/dL (ref 3.6–5.1)
Alkaline phosphatase (APISO): 66 U/L (ref 37–153)
BUN/Creatinine Ratio: 31 (calc) — ABNORMAL HIGH (ref 6–22)
BUN: 26 mg/dL — ABNORMAL HIGH (ref 7–25)
CO2: 23 mmol/L (ref 20–32)
Calcium: 9.4 mg/dL (ref 8.6–10.4)
Chloride: 103 mmol/L (ref 98–110)
Creat: 0.84 mg/dL (ref 0.60–1.00)
Globulin: 2.4 g/dL (calc) (ref 1.9–3.7)
Glucose, Bld: 136 mg/dL — ABNORMAL HIGH (ref 65–99)
Potassium: 3.8 mmol/L (ref 3.5–5.3)
Sodium: 135 mmol/L (ref 135–146)
Total Bilirubin: 0.3 mg/dL (ref 0.2–1.2)
Total Protein: 6.3 g/dL (ref 6.1–8.1)
eGFR: 74 mL/min/{1.73_m2} (ref >=60)

## 2021-03-21 LAB — CBC WITH DIFFERENTIAL/PLATELET
Absolute Monocytes: 1100 cells/uL — ABNORMAL HIGH (ref 200–950)
Basophils Absolute: 118 cells/uL (ref 0–200)
Basophils Relative: 0.9 %
Eosinophils Absolute: 668 cells/uL — ABNORMAL HIGH (ref 15–500)
Eosinophils Relative: 5.1 %
HCT: 37.2 % (ref 35.0–45.0)
Hemoglobin: 12.1 g/dL (ref 11.7–15.5)
Lymphs Abs: 1755 cells/uL (ref 850–3900)
MCH: 30.4 pg (ref 27.0–33.0)
MCHC: 32.5 g/dL (ref 32.0–36.0)
MCV: 93.5 fL (ref 80.0–100.0)
MPV: 10.8 fL (ref 7.5–12.5)
Monocytes Relative: 8.4 %
Neutro Abs: 9458 cells/uL — ABNORMAL HIGH (ref 1500–7800)
Neutrophils Relative %: 72.2 %
Platelets: 311 10*3/uL (ref 140–400)
RBC: 3.98 10*6/uL (ref 3.80–5.10)
RDW: 12.8 % (ref 11.0–15.0)
Total Lymphocyte: 13.4 %
WBC: 13.1 10*3/uL — ABNORMAL HIGH (ref 3.8–10.8)

## 2021-03-21 MED ORDER — TRIAMCINOLONE ACETONIDE 40 MG/ML IJ SUSP
40.0000 mg | INTRAMUSCULAR | Status: AC | PRN
  Administered 2021-03-21: 40 mg via INTRA_ARTICULAR

## 2021-03-21 MED ORDER — LIDOCAINE HCL 1 % IJ SOLN
1.5000 mL | INTRAMUSCULAR | Status: AC | PRN
  Administered 2021-03-21: 1.5 mL

## 2021-03-21 NOTE — Patient Instructions (Signed)
Standing Labs We placed an order today for your standing lab work.   Please have your standing labs drawn in March and every 3 months  If possible, please have your labs drawn 2 weeks prior to your appointment so that the provider can discuss your results at your appointment.  Please note that you may see your imaging and lab results in [place] before we have reviewed them. We may be awaiting multiple results to interpret others before contacting you. Please allow our office up to 72 hours to thoroughly review all of the results before contacting the office for clarification of your results.  We have open lab daily: Monday through Thursday from 1:30-4:30 PM and Friday from 1:30-4:00 PM at the office of Dr. Bo Merino, [facility].   Please be advised, all patients with office appointments requiring lab work will take precedent over walk-in lab work.  If possible, please come for your lab work on Monday and Friday afternoons, as you may experience shorter wait times. The office is located at [address] [place], [place], [place] [address] No appointment is necessary.   Labs are drawn by [facility]. Please bring your co-pay at the time of your lab draw.  You may receive a bill from [facility] for your lab work.  If you wish to have your labs drawn at another location, please call the office 24 hours in advance to send orders.  If you have any questions regarding directions or hours of operation,  please call [phone].   As a reminder, please drink plenty of water prior to coming for your lab work. Thanks!   Vaccines You are taking a medication(s) that can suppress your immune system.  The following immunizations are recommended: Flu annually Covid-19  Td/Tdap (tetanus, diphtheria, pertussis) every 10 years Pneumonia (Prevnar 15 then Pneumovax 23 at least 1 year apart.  Alternatively, can take Prevnar 20 without needing additional dose) Shingrix: 2 doses from 4 weeks  to 6 months apart  Please check with your PCP to make sure you are up to date.   If you have signs or symptoms of an infection or start antibiotics: First, call your PCP for workup of your infection. Hold your medication through the infection, until you complete your antibiotics, and until symptoms resolve if you take the following: Injectable medication (Actemra, Benlysta, Cimzia, Cosentyx, Enbrel, Humira, Kevzara, Orencia, Remicade, Simponi, Stelara, Taltz, Tremfya) Methotrexate Leflunomide (Arava) Mycophenolate (Cellcept) Morrie Sheldon, Olumiant, or Rinvoq

## 2021-03-21 NOTE — Telephone Encounter (Signed)
Patient states when she was in the office for an appointment today it was mentioned she should see cardiology for swelling with her eyelids and swelling her bilateral lower extremities. Patient states she called the cardiology office and they advised her she needs a referral. Patient prefers Dr. Harrington Challenger with [facility]. Please advise.

## 2021-03-21 NOTE — Telephone Encounter (Signed)
Referral placed.

## 2021-03-21 NOTE — Telephone Encounter (Signed)
Okay to place referral to cardiology (Dr. Harrington Challenger) for pedal  and periorbital edema.

## 2021-03-21 NOTE — Addendum Note (Signed)
Addended by: Carole Binning on: 03/21/2021 03:01 PM   Modules accepted: Orders

## 2021-03-27 ENCOUNTER — Other Ambulatory Visit: Payer: Self-pay | Admitting: Physician Assistant

## 2021-03-28 NOTE — Telephone Encounter (Signed)
Next Visit: 06/19/2021  Last Visit: 03/21/2021  Last Fill: 12/26/2020  DX:  Psoriatic arthritis   Current Dose per office note 03/21/2021: prednisone 5 mg 1 tablet daily  Okay to refill Prednisone?

## 2021-04-04 ENCOUNTER — Telehealth: Payer: Self-pay

## 2021-04-04 NOTE — Telephone Encounter (Signed)
PA renewal initiated automatically by CoverMyMeds.  Submitted a Prior Authorization request to [facility] for TALTZ via CoverMyMeds. Will update once we receive a response.   Key: [id number]

## 2021-04-05 NOTE — Telephone Encounter (Addendum)
New insurance info is as follows:  RxBIN: [id number] RxPCN: [id number] RxGRP: [id number] ID: [id number]  Will resubmit Taltz PA request as well as BIV for [name] through new plan info.

## 2021-04-05 NOTE — Telephone Encounter (Signed)
PA request has been canceled due to lack of eligibility with PBM. Will f/u with pt to determine current Rx benefit info.

## 2021-04-05 NOTE — Telephone Encounter (Signed)
Submitted a FAXED Prior Authorization request to  [facility]  for TALTZ via CoverMyMeds. Will update once we receive a response.  Key: [id number]   Submitted a FAXEDPrior Authorization request to  [facility]  for [place] via CoverMyMeds. Will update once we receive a response.  KeyJoanell Rising   Phone# [phone] Fax# [phone]

## 2021-04-07 ENCOUNTER — Other Ambulatory Visit (HOSPITAL_BASED_OUTPATIENT_CLINIC_OR_DEPARTMENT_OTHER): Payer: Self-pay | Admitting: Obstetrics & Gynecology

## 2021-04-07 ENCOUNTER — Telehealth: Payer: Self-pay | Admitting: Rheumatology

## 2021-04-07 MED ORDER — OXYBUTYNIN CHLORIDE ER 5 MG PO TB24: 5.0000 mg | ORAL_TABLET | Freq: Every day | ORAL | 1 refills | Status: DC

## 2021-04-07 NOTE — Telephone Encounter (Signed)
Patient called the office stating she started physical therapy. Patient states the physician recommended extra corpreal shockwave therapy. Wants to know what Dr. Estanislado Pandy thinks about this.

## 2021-04-07 NOTE — Telephone Encounter (Signed)
Dr. Estanislado Pandy is not in the office, but if the therapist thinks the patient would benefit from this it is ok to proceed.

## 2021-04-07 NOTE — Progress Notes (Signed)
Pt called reporting oxytrol patch has really helped with urinary leakage.  However, patch is now breaking out her skin.  Oral oxytrol xl recommended.  Will start with 5mg  dosage.  Pt aware there is a 10mg  and 15mg  dosage.  Advised of side effects and asked pt to give call back if has any issues or if feels needs increased dosage.

## 2021-04-07 NOTE — Telephone Encounter (Signed)
Patient advised Dr. Estanislado Pandy is not in the office, but if the therapist thinks she would benefit from this it is ok to proceed. Patient expressed understanding.

## 2021-04-13 ENCOUNTER — Telehealth: Payer: Self-pay

## 2021-04-13 NOTE — Telephone Encounter (Signed)
I made the addendum to the note.  Thank you

## 2021-04-13 NOTE — Telephone Encounter (Signed)
Tonya Hall from [facility] called stating they received referral for pedal & periorbital edema.  They reviewed office note in Epic and unable to find diagnosis.  Please fax notes with information on why patient is being referred to [phone]

## 2021-04-13 NOTE — Telephone Encounter (Signed)
Faxed office note. 

## 2021-04-20 ENCOUNTER — Encounter: Payer: Self-pay | Admitting: Internal Medicine

## 2021-04-20 ENCOUNTER — Ambulatory Visit: Payer: PPO | Admitting: Internal Medicine

## 2021-04-20 ENCOUNTER — Ambulatory Visit: Payer: PPO | Admitting: Rheumatology

## 2021-04-20 ENCOUNTER — Other Ambulatory Visit: Payer: Self-pay

## 2021-04-20 VITALS — BP 158/74 | HR 93 | Ht 59.5 in | Wt 134.6 lb

## 2021-04-20 DIAGNOSIS — R601 Generalized edema: Secondary | ICD-10-CM

## 2021-04-20 DIAGNOSIS — E119 Type 2 diabetes mellitus without complications: Secondary | ICD-10-CM | POA: Diagnosis not present

## 2021-04-20 DIAGNOSIS — E785 Hyperlipidemia, unspecified: Secondary | ICD-10-CM

## 2021-04-20 NOTE — Progress Notes (Signed)
OFFICE CONSULT NOTE  Chief Complaint:  Pedal edema and periorbital edema  Primary Care Physician: [name], MD  HPI:  Tonya Hall is a 71 y.o. female who is being seen today for the evaluation of pedal and periorbital edema at the request of Bo Merino, MD. 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  She has had some difficulty with her vision noting swelling on the upper and lower lids.  She has been seen by 2 eye surgeons the, second of which felt that she could undergo surgery however it was too costly for her and would have required a brow lift.  She also has noted some lower extremity edema which is dependent.  There is also dependent discoloration which sounds like venous congestion.  She notes to have lower extremity varicosities.  She also has venous lakes in her feet as well.  She denies any shortness of breath or chest pain, no orthopnea, PND or other heart failure symptoms.  PMHx:  Past Medical History:  Diagnosis Date   Asthma    Albuterol in haler prn   Cataract    immature unsure which eye   Chronic back pain    COVID-19 October/November 2020   Degenerative disk disease    psoriatic   Diabetes mellitus without complication (HCC)    diet controlled   Esophageal motility disorder    Non-specific, see modified barium study/speech path, BP   Fibromyalgia    GERD (gastroesophageal reflux disease)    takes Protonix bid   Headache 2019   History of blood transfusion    post c-section   History of bronchitis    HTN (hypertension)    Hyperlipidemia    takes Pravastatin daily   Lymphocytic colitis 05/26/2010   Responded to Entocort x 3 MOS   Neuropathy    Nonallergic rhinitis    Osteoporosis    gets Boniva every 3  months   Peripheral edema    takes HCTZ daily   Pneumonia 02/2019   Psoriatic arthritis ([place])    Dr. Katherina Right   Psoriatic arthritis [facility])    bilateral hands   Raynaud's disease    Seasonal allergies    Shingles 2020   SUI (stress urinary incontinence, female)    Urinary urgency     Past Surgical History:  Procedure Laterality Date   ANTERIOR CERVICAL DECOMP/DISCECTOMY FUSION N/A 03/09/2020   Procedure: ANTERIOR CERVICAL DECOMPRESSION/DISCECTOMY FUSION, INTERBODY PROSTHESIS, PLATE SCREWS CERVICAL FIVE-SIX, CERVICAL SIX-SEVEN;  Surgeon: Newman Pies, MD;  Location: [place];  Service: Neurosurgery;  Laterality: N/A;   BACK SURGERY  2003   BIOPSY  04/17/2016   Procedure: BIOPSY;  Surgeon: Danie Binder, MD;  [facility];  Service: Endoscopy;;  random colon bx's   CERVICAL SPINE SURGERY  09/09/2017   CESAREAN SECTION  1974, 1978       COLONOSCOPY  06/19/2008   IRS:WNIOE internal hemorrhoids/mild sigmoin colon diverticulosis   COLONOSCOPY  2012   Dr. Gala Romney: normal rectum, diverticula, lymphocytic colitis    COLONOSCOPY WITH PROPOFOL N/A 04/17/2016   Dr. Oneida Alar: Normal terminal ileum, internal/external hemorrhoids, random colon biopsies consistent with collagenous colitis   DILATION AND CURETTAGE OF UTERUS  1977   spontaneous abortion   ESOPHAGOGASTRODUODENOSCOPY  06/19/2008   [id number] gastritis   LUMBAR DISC  ARTHROPLASTY  7/98   LUMBAR FUSION  6/03, 11/08, 11/14   L4-5 fusion, L2-3 fusion, L1-2   LUMBAR FUSION  08/03/2020   L4-L5   LUMBAR LAMINECTOMY  03/2020   ODONTOID SCREW INSERTION N/A 01/27/2018   Procedure: ODONTOID SCREW INSERTION;  Surgeon: Newman Pies, MD;  Location: [facility];  Service: Neurosurgery;  Laterality: N/A;  ODONTOID SCREW INSERTION   TONSILLECTOMY     TONSILLECTOMY AND ADENOIDECTOMY     TUBAL LIGATION      FAMHx:  Family History  Problem Relation Age of Onset   Diabetes Mother    Pancreatitis Mother    Arthritis Mother         psoriatic    Fibromyalgia Mother    Rheumatic fever Father    Diabetes Other        grandparents   Skin cancer Other        grandfather   Hypertension Other        grandparent   Congestive Heart Failure Other        grandfather   Parkinson's disease Other        grandmother   Colon cancer Maternal Uncle    Fibromyalgia Sister    Rheum arthritis Sister    Diabetes Sister    Fibromyalgia Sister    Diabetes Sister    Rheum arthritis Sister    Hypertension Son    Colon polyps Neg Hx     SOCHx:   reports that she has never smoked. She has never used smokeless tobacco. She reports that she does not drink alcohol and does not use drugs.  ALLERGIES:  Allergies  Allergen Reactions   Adalimumab Rash and Other (See Comments)    FEVER, Fast pulse   Imuran [Azathioprine Sodium] Rash    Denies Airway involvement   Methotrexate Other (See Comments)    Liver Enzyme elevation - after 1 month of use   Plaquenil [Hydroxychloroquine Sulfate] Rash    Denies airway involvement.    Sulfonamide Derivatives Rash    Occurred with Sulfasalazine. Rash only.    Ceftin [Cefuroxime Axetil] Nausea Only    Upset stomach   Morphine Nausea And Vomiting    IV morphine caused N and V  After surgery.   Has tolerated Kadian in the past     ROS: Pertinent items noted in HPI and remainder of comprehensive ROS otherwise negative.  HOME MEDS: Current Outpatient Medications on File Prior to Visit  Medication Sig Dispense Refill   albuterol (PROVENTIL HFA;VENTOLIN HFA) 108 (90 BASE) MCG/ACT inhaler Inhale 2 puffs into the lungs every 6 (six) hours as needed for wheezing or shortness of breath.      Apremilast (OTEZLA) 30 MG TABS Take 1 tablet (30 mg total) by mouth 2 (two) times daily. 180 tablet 0   ascorbic acid (VITAMIN C) 500 MG tablet Take 500 mg by mouth 2 (two) times daily.     aspirin 81 MG tablet Take 81 mg by mouth daily.     azelastine (ASTELIN) 0.1 % nasal spray Place 1 spray into both  nostrils 2 (two) times daily.     Calcium Carb-Cholecalciferol (CALCIUM 600 + D PO) Take 1 tablet by mouth 2 (two) times daily.      cetirizine (ZYRTEC) 10 MG tablet as needed.     denosumab (PROLIA) 60 MG/ML SOSY injection Inject 60 mg into the skin every 6 (six) months.     fish oil-omega-3 fatty acids 1000 MG capsule Take 1 g by  mouth daily.     gabapentin (NEURONTIN) 300 MG capsule Take 600 mg by mouth See admin instructions. Take 600 mg 3 times daily, may take a 4th 300 mg dose as needed for pain     Ginger, Zingiber officinalis, (GINGER ROOT) 500 MG CAPS Take 500 mg by mouth daily.     Glucosamine Sulfate 1000 MG CAPS Take 2 capsules (2,000 mg total) by mouth daily.     HUMULIN N KWIKPEN 100 UNIT/ML Kiwkpen Inject 10 Units into the skin at bedtime.     hydrocortisone sodium succinate (SOLU-CORTEF) 100 MG SOLR injection Inject 100 mg into the muscle daily as needed (when unable to keep down prednisone tablet).      Lancets (ONETOUCH DELICA PLUS [id number]) MISC USE TO TEST 3 TIMESUDAILY.     leflunomide (ARAVA) 20 MG tablet TAKE 1 TABLET BY MOUTH ONCE DAILY. 90 tablet 0   losartan (COZAAR) 100 MG tablet Take 100 mg by mouth daily.     metoprolol succinate (TOPROL-XL) 50 MG 24 hr tablet Take 1 tablet (50 mg total) by mouth daily. Take with or immediately following a meal.     Misc Natural Products (TART CHERRY ADVANCED PO) Take 1 tablet by mouth daily. daily     Multiple Vitamin (MULTIVITAMIN) tablet Take 1 tablet by mouth daily.     nortriptyline (PAMELOR) 25 MG capsule TAKE (1) CAPSULE BY MOUTH AT BEDTIME. (Patient taking differently: Take 25 mg by mouth at bedtime.) 30 capsule 0   ONETOUCH VERIO test strip      oxybutynin (DITROPAN-XL) 5 MG 24 hr tablet Take 1 tablet (5 mg total) by mouth at bedtime. 30 tablet 1   Polyethyl Glycol-Propyl Glycol (SYSTANE OP) Apply to eye 4 (four) times daily.     potassium chloride SA (K-DUR,KLOR-CON) 20 MEQ tablet Take 20 mEq by mouth daily.       predniSONE (DELTASONE) 5 MG tablet TAKE 1 TABLET BY MOUTH DAILY WITH BREAKFAST 90 tablet 0   RUTIN PO Take 350 mg by mouth 3 (three) times daily.     spironolactone (ALDACTONE) 25 MG tablet Take 25 mg by mouth daily.     SURE COMFORT PEN NEEDLES 31G X 5 MM MISC      TALTZ 80 MG/ML SOAJ INJECT 80MG  (1 PEN) UNDER THE SKIN EVERY 4 WEEKS 3 mL 0   topiramate (TOPAMAX) 200 MG tablet TAKE (1) TABLET BY MOUTH AT BEDTIME. 90 tablet 0   Turmeric 500 MG CAPS Take 500 mg by mouth every morning.     omeprazole (PRILOSEC) 20 MG capsule Take 1 capsule (20 mg total) by mouth daily. (Patient taking differently: Take 20 mg by mouth 2 (two) times daily before a meal.) 30 capsule 0   [DISCONTINUED] Glucosamine 500 MG TABS Take 4 tablets by mouth daily.       [DISCONTINUED] simvastatin (ZOCOR) 40 MG tablet Take 20 mg by mouth at bedtime.      No current facility-administered medications on file prior to visit.    LABS/IMAGING: No results found for this or any previous visit (from the past 48 hour(s)). No results found.  LIPID PANEL:    Component Value Date/Time   TRIG 289 (H) 02/06/2019 1712    WEIGHTS: Wt Readings from Last 3 Encounters:  04/20/21 134 lb 9.6 oz (61.1 kg)  03/21/21 133 lb 9.6 oz (60.6 kg)  03/10/21 131 lb 3.2 oz (59.5 kg)    VITALS: BP (!) 158/74    Pulse 93  Ht 4' 11.5" (1.511 m)    Wt 134 lb 9.6 oz (61.1 kg)    LMP 02/01/1999    SpO2 98%    BMI 26.73 kg/m   EXAM: General appearance: alert and no distress Neck: no carotid bruit, no JVD, and thyroid not enlarged, symmetric, no tenderness/mass/nodules Lungs: clear to auscultation bilaterally Heart: regular rate and rhythm, S1, S2 normal, no murmur, click, rub or gallop Abdomen: soft, non-tender; bowel sounds normal; no masses,  no organomegaly Extremities: venous stasis dermatitis noted Pulses: 2+ and symmetric Skin: Skin color, texture, turgor normal. No rashes or lesions Neurologic: Grossly normal Psych:  Pleasant  EKG: Normal sinus rhythm at 93, nonspecific ST and T wave changes- personally reviewed  ASSESSMENT: Periorbital and pedal edema, do not suspect heart failure Hypertension Dyslipidemia Psoriatic arthritis DM2  PLAN: 1.   Ms. Aden has periorbital and pedal edema.  I suspect the lower extremity edema is related to neuropathy.  She has significant deformities related to psoriatic arthritis and her periorbital edema most likely is related to chronic steroid use.  She has some cushingoid features.  Blood pressure is elevated.  She has multiple cardiovascular risk factors including type 2 diabetes, dyslipidemia, hypertension and psoriatic arthritis, associated with 7 times a risk of heart disease.  That being said I think it would be reasonable to consider further coronary evaluation and I did recommend calcium scoring as she is asymptomatic.  She is agreeable to this.  Plan follow-up with me in 4 months or sooner as necessary.  Thanks again for the kind referral.  Pixie Casino, MD, FACC, [place] Director of the [facility]  [facility] Diplomate of the American Board of Clinical Lipidology Attending Cardiologist  Direct Dial: [phone]   Fax: [phone]  Website:  [email or web address] 04/20/2021, 10:33 PM

## 2021-04-20 NOTE — Patient Instructions (Signed)
Medication Instructions:  NO CHANGES -- will depend on lab results + calcium score   *If you need a refill on your cardiac medications before your next appointment, please call your pharmacy*   Lab Work: FASTING lab work to assess cholesterol   If you have labs (blood work) drawn today and your tests are completely normal, you will receive your results only by: [place] (if you have MyChart) OR A paper copy in the mail If you have any lab test that is abnormal or we need to change your treatment, we will call you to review the results.   Testing/Procedures: Dr. Debara Pickett has ordered a CT coronary calcium score.   Test locations:  [facility] ([address] [place], [place] [address]) [facility] ([address] [place], [place] [address])   This is $99 out of pocket.   Coronary CalciumScan A coronary calcium scan is an imaging test used to look for deposits of calcium and other fatty materials (plaques) in the inner lining of the blood vessels of the heart (coronary arteries). These deposits of calcium and plaques can partly clog and narrow the coronary arteries without producing any symptoms or warning signs. This puts a person at risk for a heart attack. This test can detect these deposits before symptoms develop. Tell a health care provider about: Any allergies you have. All medicines you are taking, including vitamins, herbs, eye drops, creams, and over-the-counter medicines. Any problems you or family members have had with anesthetic medicines. Any blood disorders you have. Any surgeries you have had. Any medical conditions you have. Whether you are pregnant or may be pregnant. What are the risks? Generally, this is a safe procedure. However, problems may occur, including: Harm to a pregnant woman and her unborn baby. This test involves the use of radiation. Radiation exposure can be dangerous to a pregnant woman and her unborn baby. If you are  pregnant, you generally should not have this procedure done. Slight increase in the risk of cancer. This is because of the radiation involved in the test. What happens before the procedure? No preparation is needed for this procedure. What happens during the procedure? You will undress and remove any jewelry around your neck or chest. You will put on a hospital gown. Sticky electrodes will be placed on your chest. The electrodes will be connected to an electrocardiogram (ECG) machine to record a tracing of the electrical activity of your heart. A CT scanner will take pictures of your heart. During this time, you will be asked to lie still and hold your breath for 2-3 seconds while a picture of your heart is being taken. The procedure may vary among health care providers and hospitals. What happens after the procedure? You can get dressed. You can return to your normal activities. It is up to you to get the results of your test. Ask your health care provider, or the department that is doing the test, when your results will be ready. Summary A coronary calcium scan is an imaging test used to look for deposits of calcium and other fatty materials (plaques) in the inner lining of the blood vessels of the heart (coronary arteries). Generally, this is a safe procedure. Tell your health care provider if you are pregnant or may be pregnant. No preparation is needed for this procedure. A CT scanner will take pictures of your heart. You can return to your normal activities after the scan is done. This information is not intended to replace  advice given to you by your health care provider. Make sure you discuss any questions you have with your health care provider. Document Released: 09/15/2007 Document Revised: 02/06/2016 Document Reviewed: 02/06/2016 Elsevier Interactive Patient Education  2017 [place]: At [facility], you and your health needs are our priority.  As part of  our continuing mission to provide you with exceptional heart care, we have created designated Provider Care Teams.  These Care Teams include your primary Cardiologist (physician) and Advanced Practice Providers (APPs -  Physician Assistants and Nurse Practitioners) who all work together to provide you with the care you need, when you need it.  We recommend signing up for the patient portal called "MyChart".  Sign up information is provided on this After Visit Summary.  MyChart is used to connect with patients for Virtual Visits (Telemedicine).  Patients are able to view lab/test results, encounter notes, upcoming appointments, etc.  Non-urgent messages can be sent to your provider as well.   To learn more about what you can do with MyChart, go to [email or web address].    Your next appointment:   4 month(s)  The format for your next appointment:   In Person  Provider:   Dr. Lyman Bishop

## 2021-04-25 NOTE — Telephone Encounter (Signed)
Called [employer] for [employer] for update on patient's prior authorizations for Tonya Hall  Phone: [phone]  Received a verbal notification from [employer]  regarding a prior authorization for [name]. Authorization has been APPROVED from 04/07/21 to 04/01/22.  Patient fills through [employer] [facility]. Requested fax of approval letter - they had incorrect fax number on file  Authorization # [id number]  Per rep, they did not receive [name] prior auth request. Routing to Barrett Shell to re-fax  Knox Saliva, PharmD, MPH, BCPS Clinical Pharmacist (Rheumatology and Pulmonology)

## 2021-04-28 NOTE — Telephone Encounter (Signed)
Received notification from  [place]  regarding a prior authorization for [place]. Authorization has been APPROVED from 04/28/2021 to 04/01/2022. Approval letter sent to scan center.  Authorization # [id number]

## 2021-05-09 DIAGNOSIS — L720 Epidermal cyst: Secondary | ICD-10-CM | POA: Diagnosis not present

## 2021-05-09 DIAGNOSIS — L304 Erythema intertrigo: Secondary | ICD-10-CM | POA: Diagnosis not present

## 2021-05-09 DIAGNOSIS — D2272 Melanocytic nevi of left lower limb, including hip: Secondary | ICD-10-CM | POA: Diagnosis not present

## 2021-05-09 DIAGNOSIS — D2262 Melanocytic nevi of left upper limb, including shoulder: Secondary | ICD-10-CM | POA: Diagnosis not present

## 2021-05-09 DIAGNOSIS — D225 Melanocytic nevi of trunk: Secondary | ICD-10-CM | POA: Diagnosis not present

## 2021-05-09 DIAGNOSIS — D692 Other nonthrombocytopenic purpura: Secondary | ICD-10-CM | POA: Diagnosis not present

## 2021-05-09 DIAGNOSIS — B353 Tinea pedis: Secondary | ICD-10-CM | POA: Diagnosis not present

## 2021-05-09 DIAGNOSIS — S0001XA Abrasion of scalp, initial encounter: Secondary | ICD-10-CM | POA: Diagnosis not present

## 2021-05-09 DIAGNOSIS — L578 Other skin changes due to chronic exposure to nonionizing radiation: Secondary | ICD-10-CM | POA: Diagnosis not present

## 2021-05-09 DIAGNOSIS — Z86018 Personal history of other benign neoplasm: Secondary | ICD-10-CM | POA: Diagnosis not present

## 2021-05-09 DIAGNOSIS — L570 Actinic keratosis: Secondary | ICD-10-CM | POA: Diagnosis not present

## 2021-05-09 DIAGNOSIS — L821 Other seborrheic keratosis: Secondary | ICD-10-CM | POA: Diagnosis not present

## 2021-05-17 ENCOUNTER — Ambulatory Visit: Payer: PPO | Admitting: Podiatry

## 2021-05-17 ENCOUNTER — Other Ambulatory Visit: Payer: Self-pay

## 2021-05-17 ENCOUNTER — Encounter: Payer: Self-pay | Admitting: Podiatry

## 2021-05-17 DIAGNOSIS — M2011 Hallux valgus (acquired), right foot: Secondary | ICD-10-CM | POA: Diagnosis not present

## 2021-05-17 DIAGNOSIS — Q828 Other specified congenital malformations of skin: Secondary | ICD-10-CM | POA: Diagnosis not present

## 2021-05-17 DIAGNOSIS — M2041 Other hammer toe(s) (acquired), right foot: Secondary | ICD-10-CM

## 2021-05-17 DIAGNOSIS — D2370 Other benign neoplasm of skin of unspecified lower limb, including hip: Secondary | ICD-10-CM | POA: Insufficient documentation

## 2021-05-17 DIAGNOSIS — M2012 Hallux valgus (acquired), left foot: Secondary | ICD-10-CM | POA: Diagnosis not present

## 2021-05-17 DIAGNOSIS — B351 Tinea unguium: Secondary | ICD-10-CM

## 2021-05-17 DIAGNOSIS — D2262 Melanocytic nevi of left upper limb, including shoulder: Secondary | ICD-10-CM | POA: Insufficient documentation

## 2021-05-17 DIAGNOSIS — E119 Type 2 diabetes mellitus without complications: Secondary | ICD-10-CM

## 2021-05-17 DIAGNOSIS — M2042 Other hammer toe(s) (acquired), left foot: Secondary | ICD-10-CM

## 2021-05-17 DIAGNOSIS — D225 Melanocytic nevi of trunk: Secondary | ICD-10-CM | POA: Insufficient documentation

## 2021-05-17 DIAGNOSIS — M79675 Pain in left toe(s): Secondary | ICD-10-CM

## 2021-05-17 DIAGNOSIS — L821 Other seborrheic keratosis: Secondary | ICD-10-CM | POA: Insufficient documentation

## 2021-05-17 DIAGNOSIS — Z87898 Personal history of other specified conditions: Secondary | ICD-10-CM | POA: Insufficient documentation

## 2021-05-17 DIAGNOSIS — L602 Onychogryphosis: Secondary | ICD-10-CM

## 2021-05-18 ENCOUNTER — Other Ambulatory Visit: Payer: Self-pay

## 2021-05-18 DIAGNOSIS — L409 Psoriasis, unspecified: Secondary | ICD-10-CM

## 2021-05-18 MED ORDER — OTEZLA 30 MG PO TABS: 30.0000 mg | ORAL_TABLET | Freq: Two times a day (BID) | ORAL | 0 refills | Status: DC

## 2021-05-18 NOTE — Telephone Encounter (Signed)
Anderson Malta from [employer] left a voicemail requesting prescription refill of [name] for the patient.  Please fax prescription to [phone]   If you have any questions, please call back at [phone]

## 2021-05-18 NOTE — Telephone Encounter (Signed)
Next Visit: 06/19/2021   Last Visit: 03/21/2021  Last Fill: 02/21/2021   DX:  Psoriatic arthritis    Current Dose per office note 03/21/2021: [name] 30 mg 1 tablet by mouth twice daily  Labs: 03/21/2021 CBC and CMP stable.   Okay to refill [name]?

## 2021-05-21 ENCOUNTER — Other Ambulatory Visit: Payer: Self-pay

## 2021-05-21 ENCOUNTER — Ambulatory Visit
Admission: EM | Admit: 2021-05-21 | Discharge: 2021-05-21 | Disposition: A | Discharge status: Home / Self Care | Payer: PPO | Attending: Urgent Care | Admitting: Urgent Care

## 2021-05-21 DIAGNOSIS — Z794 Long term (current) use of insulin: Secondary | ICD-10-CM | POA: Diagnosis not present

## 2021-05-21 DIAGNOSIS — R21 Rash and other nonspecific skin eruption: Secondary | ICD-10-CM

## 2021-05-21 DIAGNOSIS — E119 Type 2 diabetes mellitus without complications: Secondary | ICD-10-CM

## 2021-05-21 DIAGNOSIS — R220 Localized swelling, mass and lump, head: Secondary | ICD-10-CM

## 2021-05-21 DIAGNOSIS — T7840XA Allergy, unspecified, initial encounter: Secondary | ICD-10-CM | POA: Diagnosis not present

## 2021-05-21 DIAGNOSIS — E118 Type 2 diabetes mellitus with unspecified complications: Secondary | ICD-10-CM | POA: Diagnosis not present

## 2021-05-21 MED ORDER — PREDNISONE 10 MG PO TABS: 30.0000 mg | ORAL_TABLET | Freq: Every day | ORAL | 0 refills | Status: DC

## 2021-05-21 MED ORDER — HYDROXYZINE HCL 25 MG PO TABS: 12.5000 mg | ORAL_TABLET | Freq: Three times a day (TID) | ORAL | 0 refills | Status: DC | PRN

## 2021-05-21 NOTE — ED Provider Notes (Signed)
[place]   MRN: [id number] DOB: [birth year]  Subjective:   Tonya Hall is a 71 y.o. female presenting for acute onset this afternoon of facial swelling, redness, itching.  Patient did take Benadryl which may have helped.  She has used a new toothpaste over the past 2 days but has also used a new facial scrub.  No oral swelling, throat closing sensation, chest pain, chest tightness, shortness of breath or wheezing, nausea, vomiting.  She has type 2 diabetes but is well controlled, treated with insulin.  No current facility-administered medications for this encounter.  Current Outpatient Medications:    albuterol (PROVENTIL HFA;VENTOLIN HFA) 108 (90 BASE) MCG/ACT inhaler, Inhale 2 puffs into the lungs every 6 (six) hours as needed for wheezing or shortness of breath. , Disp: , Rfl:    Apremilast (OTEZLA) 30 MG TABS, Take 1 tablet (30 mg total) by mouth 2 (two) times daily., Disp: 180 tablet, Rfl: 0   ascorbic acid (VITAMIN C) 500 MG tablet, Take 500 mg by mouth 2 (two) times daily., Disp: , Rfl:    aspirin 81 MG tablet, Take 81 mg by mouth daily., Disp: , Rfl:    azelastine (ASTELIN) 0.1 % nasal spray, Place 1 spray into both nostrils 2 (two) times daily., Disp: , Rfl:    Calcium Carb-Cholecalciferol (CALCIUM 600 + D PO), Take 1 tablet by mouth 2 (two) times daily. , Disp: , Rfl:    cetirizine (ZYRTEC) 10 MG tablet, as needed., Disp: , Rfl:    denosumab (PROLIA) 60 MG/ML SOSY injection, Inject 60 mg into the skin every 6 (six) months., Disp: , Rfl:    fish oil-omega-3 fatty acids 1000 MG capsule, Take 1 g by mouth daily., Disp: , Rfl:    gabapentin (NEURONTIN) 300 MG capsule, Take 600 mg by mouth See admin instructions. Take 600 mg 3 times daily, may take a 4th 300 mg dose as needed for pain, Disp: , Rfl:    Ginger, Zingiber officinalis, (GINGER ROOT) 500 MG CAPS, Take 500 mg by mouth daily., Disp: , Rfl:    Glucosamine Sulfate 1000 MG CAPS, Take 2 capsules (2,000 mg  total) by mouth daily., Disp: , Rfl:    HUMULIN N KWIKPEN 100 UNIT/ML Kiwkpen, Inject 10 Units into the skin at bedtime., Disp: , Rfl:    hydrocortisone sodium succinate (SOLU-CORTEF) 100 MG SOLR injection, Inject 100 mg into the muscle daily as needed (when unable to keep down prednisone tablet). , Disp: , Rfl:    Lancets (ONETOUCH DELICA PLUS [id number]) MISC, USE TO TEST 3 TIMESUDAILY., Disp: , Rfl:    leflunomide (ARAVA) 20 MG tablet, TAKE 1 TABLET BY MOUTH ONCE DAILY., Disp: 90 tablet, Rfl: 0   losartan (COZAAR) 100 MG tablet, Take 100 mg by mouth daily., Disp: , Rfl:    metoprolol succinate (TOPROL-XL) 50 MG 24 hr tablet, Take 1 tablet (50 mg total) by mouth daily. Take with or immediately following a meal., Disp: , Rfl:    Misc Natural Products (TART CHERRY ADVANCED PO), Take 1 tablet by mouth daily. daily, Disp: , Rfl:    Multiple Vitamin (MULTIVITAMIN) tablet, Take 1 tablet by mouth daily., Disp: , Rfl:    nortriptyline (PAMELOR) 25 MG capsule, TAKE (1) CAPSULE BY MOUTH AT BEDTIME. (Patient taking differently: Take 25 mg by mouth at bedtime.), Disp: 30 capsule, Rfl: 0   omeprazole (PRILOSEC) 20 MG capsule, Take 1 capsule (20 mg total) by mouth daily. (Patient taking differently: Take 20 mg by mouth  2 (two) times daily before a meal.), Disp: 30 capsule, Rfl: 0   ONETOUCH VERIO test strip, , Disp: , Rfl:    oxybutynin (DITROPAN-XL) 5 MG 24 hr tablet, Take 1 tablet (5 mg total) by mouth at bedtime., Disp: 30 tablet, Rfl: 1   Polyethyl Glycol-Propyl Glycol (SYSTANE OP), Apply to eye 4 (four) times daily., Disp: , Rfl:    potassium chloride SA (K-DUR,KLOR-CON) 20 MEQ tablet, Take 20 mEq by mouth daily. , Disp: , Rfl:    predniSONE (DELTASONE) 5 MG tablet, TAKE 1 TABLET BY MOUTH DAILY WITH BREAKFAST, Disp: 90 tablet, Rfl: 0   RUTIN PO, Take 350 mg by mouth 3 (three) times daily., Disp: , Rfl:    spironolactone (ALDACTONE) 25 MG tablet, Take 25 mg by mouth daily., Disp: , Rfl:    SURE COMFORT PEN  NEEDLES 31G X 5 MM MISC, , Disp: , Rfl:    TALTZ 80 MG/ML SOAJ, INJECT 80MG  (1 PEN) UNDER THE SKIN EVERY 4 WEEKS, Disp: 3 mL, Rfl: 0   topiramate (TOPAMAX) 200 MG tablet, TAKE (1) TABLET BY MOUTH AT BEDTIME., Disp: 90 tablet, Rfl: 0   Turmeric 500 MG CAPS, Take 500 mg by mouth every morning., Disp: , Rfl:    Allergies  Allergen Reactions   Adalimumab Rash and Other (See Comments)    FEVER, Fast pulse Other reaction(s): Unknown   Imuran [Azathioprine Sodium] Rash    Denies Airway involvement   Methotrexate Other (See Comments)    Liver Enzyme elevation - after 1 month of use Other reaction(s): Unknown   Plaquenil [Hydroxychloroquine Sulfate] Rash    Denies airway involvement.    Sulfonamide Derivatives Rash    Occurred with Sulfasalazine. Rash only.    Azathioprine     Other reaction(s): Unknown   Ceftin [Cefuroxime Axetil] Nausea Only    Upset stomach   Cefuroxime     Other reaction(s): Unknown   Morphine Sulfate     Other reaction(s): Unknown   Morphine Nausea And Vomiting    IV morphine caused N and V  After surgery.   Has tolerated Kadian in the past     Past Medical History:  Diagnosis Date   Asthma    Albuterol in haler prn   Cataract    immature unsure which eye   Chronic back pain    COVID-19 October/November 2020   Degenerative disk disease    psoriatic   Diabetes mellitus without complication (HCC)    diet controlled   Esophageal motility disorder    Non-specific, see modified barium study/speech path, BP   Fibromyalgia    GERD (gastroesophageal reflux disease)    takes Protonix bid   Headache 2019   History of blood transfusion    post c-section   History of bronchitis    HTN (hypertension)    Hyperlipidemia    takes Pravastatin daily   Lymphocytic colitis 05/26/2010   Responded to Entocort x 3 MOS   Neuropathy    Nonallergic rhinitis    Osteoporosis    gets Boniva every 3 months   Peripheral edema    takes HCTZ daily   Pneumonia 02/2019    Psoriatic arthritis ([place])    Dr. Katherina Right   Psoriatic arthritis [facility])    bilateral hands   Raynaud's disease    Seasonal allergies    Shingles 2020   SUI (stress urinary incontinence, female)    Urinary urgency      Past Surgical History:  Procedure Laterality Date  ANTERIOR CERVICAL DECOMP/DISCECTOMY FUSION N/A 03/09/2020   Procedure: ANTERIOR CERVICAL DECOMPRESSION/DISCECTOMY FUSION, INTERBODY PROSTHESIS, PLATE SCREWS CERVICAL FIVE-SIX, CERVICAL SIX-SEVEN;  Surgeon: Newman Pies, MD;  Location: [place];  Service: Neurosurgery;  Laterality: N/A;   BACK SURGERY  2003   BIOPSY  04/17/2016   Procedure: BIOPSY;  Surgeon: Danie Binder, MD;  [facility];  Service: Endoscopy;;  random colon bx's   CERVICAL SPINE SURGERY  09/09/2017   CESAREAN SECTION  1974, 1978       COLONOSCOPY  06/19/2008   VVO:HYWVP internal hemorrhoids/mild sigmoin colon diverticulosis   COLONOSCOPY  2012   Dr. Gala Romney: normal rectum, diverticula, lymphocytic colitis    COLONOSCOPY WITH PROPOFOL N/A 04/17/2016   Dr. Oneida Alar: Normal terminal ileum, internal/external hemorrhoids, random colon biopsies consistent with collagenous colitis   DILATION AND CURETTAGE OF UTERUS  1977   spontaneous abortion   ESOPHAGOGASTRODUODENOSCOPY  06/19/2008   XTG:GYIRSWNI gastritis   LUMBAR DISC ARTHROPLASTY  7/98   LUMBAR FUSION  6/03, 11/08, 11/14   L4-5 fusion, L2-3 fusion, L1-2   LUMBAR FUSION  08/03/2020   L4-L5   LUMBAR LAMINECTOMY  03/2020   ODONTOID SCREW INSERTION N/A 01/27/2018   Procedure: ODONTOID SCREW INSERTION;  Surgeon: Newman Pies, MD;  Location: [place];  Service: Neurosurgery;  Laterality: N/A;  ODONTOID SCREW INSERTION   TONSILLECTOMY     TONSILLECTOMY AND ADENOIDECTOMY     TUBAL LIGATION      Family History  Problem Relation Age of Onset   Diabetes Mother    Pancreatitis Mother    Arthritis Mother        psoriatic    Fibromyalgia Mother    Rheumatic fever Father    Diabetes Other         grandparents   Skin cancer Other        grandfather   Hypertension Other        grandparent   Congestive Heart Failure Other        grandfather   Parkinson's disease Other        grandmother   Colon cancer Maternal Uncle    Fibromyalgia Sister    Rheum arthritis Sister    Diabetes Sister    Fibromyalgia Sister    Diabetes Sister    Rheum arthritis Sister    Hypertension Son    Colon polyps Neg Hx     Social History   Tobacco Use   Smoking status: Never   Smokeless tobacco: Never  Vaping Use   Vaping Use: Never used  Substance Use Topics   Alcohol use: No   Drug use: No    ROS   Objective:   Vitals: BP (!) 170/87 (BP Location: Right Arm)    Pulse 100    Temp 98.2 F (36.8 C) (Oral)    Resp 18    LMP 02/01/1999    SpO2 99%   Physical Exam Constitutional:      General: She is not in acute distress.    Appearance: Normal appearance. She is well-developed and normal weight. She is not ill-appearing, toxic-appearing or diaphoretic.  HENT:     Head: Normocephalic and atraumatic.      Right Ear: Tympanic membrane, ear canal and external ear normal. No drainage or tenderness. No middle ear effusion. There is no impacted cerumen. Tympanic membrane is not erythematous.     Left Ear: Tympanic membrane, ear canal and external ear normal. No drainage or tenderness.  No middle ear effusion. There is  no impacted cerumen. Tympanic membrane is not erythematous.     Nose: Nose normal. No congestion or rhinorrhea.     Mouth/Throat:     Mouth: Mucous membranes are moist. No oral lesions.     Pharynx: No pharyngeal swelling, oropharyngeal exudate, posterior oropharyngeal erythema or uvula swelling.     Tonsils: No tonsillar exudate or tonsillar abscesses.     Comments: Airway is patent, patient is controlling secretions. Eyes:     General: No scleral icterus.       Right eye: No discharge.        Left eye: No discharge.     Extraocular Movements: Extraocular movements intact.      Right eye: Normal extraocular motion.     Left eye: Normal extraocular motion.     Conjunctiva/sclera: Conjunctivae normal.  Cardiovascular:     Rate and Rhythm: Normal rate.     Heart sounds: No murmur heard.   No friction rub. No gallop.  Pulmonary:     Effort: Pulmonary effort is normal. No respiratory distress.     Breath sounds: Normal breath sounds. No stridor. No wheezing, rhonchi or rales.  Chest:     Chest wall: No tenderness.  Musculoskeletal:     Cervical back: Normal range of motion and neck supple.  Lymphadenopathy:     Cervical: No cervical adenopathy.  Skin:    General: Skin is warm and dry.  Neurological:     General: No focal deficit present.     Mental Status: She is alert and oriented to person, place, and time.  Psychiatric:        Mood and Affect: Mood normal.        Behavior: Behavior normal.      Assessment and Plan :   PDMP not reviewed this encounter.  1. Facial rash   2. Allergic reaction, initial encounter   3. Facial swelling   4. Type 2 diabetes mellitus treated with insulin (HCC)    Recommended oral prednisone, hydroxyzine.  Avoid the new toothpaste, new facial scrub, new exposures.  No signs of an anaphylactic reaction. Counseled patient on potential for adverse effects with medications prescribed/recommended today, ER and return-to-clinic precautions discussed, patient verbalized understanding.    Jaynee Eagles, [place] 05/24/21 [phone]

## 2021-05-21 NOTE — ED Triage Notes (Signed)
Pt reports her face started itching and swelling around 11-12 am today. Pt took Benadryl around 1300. States she started using a  new toothpaste 2 days ago. Denies trouble breathing, talking, swallowing.

## 2021-05-22 ENCOUNTER — Other Ambulatory Visit: Payer: Self-pay | Admitting: Physician Assistant

## 2021-05-22 NOTE — Telephone Encounter (Signed)
Next Visit: 06/19/2021   Last Visit: 03/21/2021   Last Fill: 02/14/2021  DX:  Psoriatic arthritis    Current Dose per office note 03/21/2021: Tonya Hall 20mg  1 tablet daily  Labs: 03/21/2021 CBC and CMP stable.   Okay to refill Arava?

## 2021-05-23 NOTE — Progress Notes (Signed)
ANNUAL DIABETIC FOOT EXAM  Subjective: Tonya Hall presents today for for annual diabetic foot examination.  Patient relates several year h/o diabetes.  Patient denies any h/o foot wounds.  Patient denies any numbness, tingling, burning, or pins/needle sensation in feet.  Patient does not monitor blood glucose daily.She checks once weekly.  Risk factors: diabetes, hyperlipidemia, HTN.  [name], MD is patient's PCP. She cannot relate her last visit.  Past Medical History:  Diagnosis Date   Asthma    Albuterol in haler prn   Cataract    immature unsure which eye   Chronic back pain    COVID-19 October/November 2020   Degenerative disk disease    psoriatic   Diabetes mellitus without complication ([place])    diet controlled   Esophageal motility disorder    Non-specific, see modified barium study/speech path, BP   Fibromyalgia    GERD (gastroesophageal reflux disease)    takes Protonix bid   Headache 2019   History of blood transfusion    post c-section   History of bronchitis    HTN (hypertension)    Hyperlipidemia    takes Pravastatin daily   Lymphocytic colitis 05/26/2010   Responded to Entocort x 3 MOS   Neuropathy    Nonallergic rhinitis    Osteoporosis    gets Boniva every 3 months   Peripheral edema    takes HCTZ daily   Pneumonia 02/2019   Psoriatic arthritis ([place])    Dr. Katherina Right   Psoriatic arthritis [facility])    bilateral hands   Raynaud's disease    Seasonal allergies    Shingles 2020   SUI (stress urinary incontinence, female)    Urinary urgency    Patient Active Problem List   Diagnosis Date Noted   Benign neoplasm of skin of lower limb 05/17/2021   History of neoplasm 05/17/2021   Melanocytic nevi of left upper limb, including shoulder 05/17/2021   Melanocytic nevi of trunk 05/17/2021   Other seborrheic keratosis 05/17/2021   Diarrhea 03/10/2021   Fracture of pelvis ([place]) 01/27/2021   Pain of left hip joint 01/06/2021   Foraminal  stenosis of lumbar region 08/03/2020   Cervical spondylosis with myelopathy and radiculopathy 03/09/2020   AKI (acute kidney injury) ([place]) 02/07/2019   Acute respiratory failure with hypoxia ([place]) 02/06/2019   Acute respiratory disease due to COVID-19 virus 02/06/2019   Community acquired pneumonia 02/06/2019   Hyponatremia 02/06/2019   Asthma    Diabetes mellitus without complication ([place])    Closed nondisplaced odontoid fracture with type II morphology ([place]) 01/27/2018   Odontoid fracture ([place]) 01/24/2018   Chest pain 11/14/2017   Transaminitis 06/22/2016   Collagenous colitis 05/23/2016   Psoriasis 05/21/2016   High risk medication use 02/15/2016   Age-related osteoporosis without current pathological fracture 02/15/2016   Trochanteric bursitis, left hip 02/15/2016   Sacroiliitis, not elsewhere classified ([place]) 02/15/2016   Chronic pain syndrome 02/15/2016   Abnormal weight gain 06/25/2012   Hypertension 07/03/2011   Hypercholesteremia 07/03/2011   Psoriatic arthritis ([place]) 07/03/2011   Osteoarthritis of multiple joints 07/03/2011   Esophageal motility disorder 02/14/2011   Cough 02/14/2011   GERD 05/05/2010   Past Surgical History:  Procedure Laterality Date   ANTERIOR CERVICAL DECOMP/DISCECTOMY FUSION N/A 03/09/2020   Procedure: ANTERIOR CERVICAL DECOMPRESSION/DISCECTOMY FUSION, INTERBODY PROSTHESIS, PLATE SCREWS CERVICAL FIVE-SIX, CERVICAL SIX-SEVEN;  Surgeon: Newman Pies, MD;  Location: [place];  Service: Neurosurgery;  Laterality: N/A;   BACK SURGERY  2003   BIOPSY  04/17/2016   Procedure: BIOPSY;  Surgeon: Danie Binder, MD;  [facility];  Service: Endoscopy;;  random colon bx's   CERVICAL SPINE SURGERY  09/09/2017   CESAREAN SECTION  1974, 1978       COLONOSCOPY  06/19/2008   QIW:LNLGX internal hemorrhoids/mild sigmoin colon diverticulosis   COLONOSCOPY  2012   Dr. Gala Romney: normal rectum, diverticula, lymphocytic colitis    COLONOSCOPY WITH PROPOFOL  N/A 04/17/2016   Dr. Oneida Alar: Normal terminal ileum, internal/external hemorrhoids, random colon biopsies consistent with collagenous colitis   DILATION AND CURETTAGE OF UTERUS  1977   spontaneous abortion   ESOPHAGOGASTRODUODENOSCOPY  06/19/2008   QJJ:HERDEYCX gastritis   LUMBAR DISC ARTHROPLASTY  7/98   LUMBAR FUSION  6/03, 11/08, 11/14   L4-5 fusion, L2-3 fusion, L1-2   LUMBAR FUSION  08/03/2020   L4-L5   LUMBAR LAMINECTOMY  03/2020   ODONTOID SCREW INSERTION N/A 01/27/2018   Procedure: ODONTOID SCREW INSERTION;  Surgeon: Newman Pies, MD;  Location: [place];  Service: Neurosurgery;  Laterality: N/A;  ODONTOID SCREW INSERTION   TONSILLECTOMY     TONSILLECTOMY AND ADENOIDECTOMY     TUBAL LIGATION     Current Outpatient Medications on File Prior to Visit  Medication Sig Dispense Refill   albuterol (PROVENTIL HFA;VENTOLIN HFA) 108 (90 BASE) MCG/ACT inhaler Inhale 2 puffs into the lungs every 6 (six) hours as needed for wheezing or shortness of breath.      ascorbic acid (VITAMIN C) 500 MG tablet Take 500 mg by mouth 2 (two) times daily.     aspirin 81 MG tablet Take 81 mg by mouth daily.     azelastine (ASTELIN) 0.1 % nasal spray Place 1 spray into both nostrils 2 (two) times daily.     Calcium Carb-Cholecalciferol (CALCIUM 600 + D PO) Take 1 tablet by mouth 2 (two) times daily.      cetirizine (ZYRTEC) 10 MG tablet as needed.     denosumab (PROLIA) 60 MG/ML SOSY injection Inject 60 mg into the skin every 6 (six) months.     fish oil-omega-3 fatty acids 1000 MG capsule Take 1 g by mouth daily.     gabapentin (NEURONTIN) 300 MG capsule Take 600 mg by mouth See admin instructions. Take 600 mg 3 times daily, may take a 4th 300 mg dose as needed for pain     Ginger, Zingiber officinalis, (GINGER ROOT) 500 MG CAPS Take 500 mg by mouth daily.     Glucosamine Sulfate 1000 MG CAPS Take 2 capsules (2,000 mg total) by mouth daily.     HUMULIN N KWIKPEN 100 UNIT/ML Kiwkpen Inject 10 Units into the  skin at bedtime.     hydrocortisone sodium succinate (SOLU-CORTEF) 100 MG SOLR injection Inject 100 mg into the muscle daily as needed (when unable to keep down prednisone tablet).      Lancets (ONETOUCH DELICA PLUS [id number]) MISC USE TO TEST 3 TIMESUDAILY.     losartan (COZAAR) 100 MG tablet Take 100 mg by mouth daily.     metoprolol succinate (TOPROL-XL) 50 MG 24 hr tablet Take 1 tablet (50 mg total) by mouth daily. Take with or immediately following a meal.     Misc Natural Products (TART CHERRY ADVANCED PO) Take 1 tablet by mouth daily. daily     Multiple Vitamin (MULTIVITAMIN) tablet Take 1 tablet by mouth daily.     nortriptyline (PAMELOR) 25 MG capsule TAKE (1) CAPSULE BY MOUTH AT BEDTIME. (Patient taking differently: Take 25 mg  by mouth at bedtime.) 30 capsule 0   omeprazole (PRILOSEC) 20 MG capsule Take 1 capsule (20 mg total) by mouth daily. (Patient taking differently: Take 20 mg by mouth 2 (two) times daily before a meal.) 30 capsule 0   ONETOUCH VERIO test strip      oxybutynin (DITROPAN-XL) 5 MG 24 hr tablet Take 1 tablet (5 mg total) by mouth at bedtime. 30 tablet 1   Polyethyl Glycol-Propyl Glycol (SYSTANE OP) Apply to eye 4 (four) times daily.     potassium chloride SA (K-DUR,KLOR-CON) 20 MEQ tablet Take 20 mEq by mouth daily.      RUTIN PO Take 350 mg by mouth 3 (three) times daily.     spironolactone (ALDACTONE) 25 MG tablet Take 25 mg by mouth daily.     SURE COMFORT PEN NEEDLES 31G X 5 MM MISC      TALTZ 80 MG/ML SOAJ INJECT 80MG  (1 PEN) UNDER THE SKIN EVERY 4 WEEKS 3 mL 0   topiramate (TOPAMAX) 200 MG tablet TAKE (1) TABLET BY MOUTH AT BEDTIME. 90 tablet 0   Turmeric 500 MG CAPS Take 500 mg by mouth every morning.     [DISCONTINUED] Glucosamine 500 MG TABS Take 4 tablets by mouth daily.       [DISCONTINUED] simvastatin (ZOCOR) 40 MG tablet Take 20 mg by mouth at bedtime.      No current facility-administered medications on file prior to visit.    Allergies  Allergen  Reactions   Adalimumab Rash and Other (See Comments)    FEVER, Fast pulse Other reaction(s): Unknown   Imuran [Azathioprine Sodium] Rash    Denies Airway involvement   Methotrexate Other (See Comments)    Liver Enzyme elevation - after 1 month of use Other reaction(s): Unknown   Plaquenil [Hydroxychloroquine Sulfate] Rash    Denies airway involvement.    Sulfonamide Derivatives Rash    Occurred with Sulfasalazine. Rash only.    Azathioprine     Other reaction(s): Unknown   Ceftin [Cefuroxime Axetil] Nausea Only    Upset stomach   Cefuroxime     Other reaction(s): Unknown   Morphine Sulfate     Other reaction(s): Unknown   Morphine Nausea And Vomiting    IV morphine caused N and V  After surgery.   Has tolerated Kadian in the past    Social History   Occupational History   Occupation: Case Education officer, environmental: [employer]    Comment: Engineer, technical sales  Tobacco Use   Smoking status: Never   Smokeless tobacco: Never  Vaping Use   Vaping Use: Never used  Substance and Sexual Activity   Alcohol use: No   Drug use: No   Sexual activity: Not Currently    Partners: Male    Birth control/protection: Surgical    Comment: BTL   Family History  Problem Relation Age of Onset   Diabetes Mother    Pancreatitis Mother    Arthritis Mother        psoriatic    Fibromyalgia Mother    Rheumatic fever Father    Diabetes Other        grandparents   Skin cancer Other        grandfather   Hypertension Other        grandparent   Congestive Heart Failure Other        grandfather   Parkinson's disease Other        grandmother   Colon cancer Maternal  Uncle    Fibromyalgia Sister    Rheum arthritis Sister    Diabetes Sister    Fibromyalgia Sister    Diabetes Sister    Rheum arthritis Sister    Hypertension Son    Colon polyps Neg Hx    Immunization History  Administered Date(s) Administered   Influenza,inj,Quad PF,6+ Mos 01/21/2017   Influenza-Unspecified 01/30/2017   Moderna  Sars-Covid-2 Vaccination 06/01/2019, 07/02/2019   Pneumococcal Polysaccharide-23 02/19/2013   Tdap 09/10/2016     Review of Systems: Negative except as noted in the HPI.   Objective: There were no vitals filed for this visit.  JASIRA ROBINSON is a pleasant 71 y.o. female in NAD. AAO X 3.  Vascular Examination: Neurovascular status unchanged bilaterally. Capillary refill time to digits immediate b/l. Palpable pedal pulses b/l LE. Pedal hair absent. No pain with calf compression b/l. Lower extremity skin temperature gradient warm to cool. Red to purple discoloration consistent with Raynaud's. No ischemia or gangrene. No clubbing b/l.Marland Kitchen  Dermatological Examination: No open wounds b/l LE. No interdigital macerations noted b/l LE. Toenails 1-5 elongated, thin with longitudinal ridging noted. Porokeratotic lesion(s) submet head 4 right foot. No erythema, no edema, no drainage, no fluctuance.  Musculoskeletal Examination: Muscle strength 5/5 to all lower extremity muscle groups bilaterally. Hallux valgus with bunion deformity noted right foot. Hammertoe deformity noted 2-5 b/l.  Footwear Assessment: Does the patient wear appropriate shoes? Yes. Does the patient need inserts/orthotics? Yes.  Neurological Examination: Protective sensation intact 5/5 intact bilaterally with 10g monofilament b/l. Vibratory sensation intact b/l. Proprioception intact bilaterally.  Hemoglobin A1C Latest Ref Rng & Units 08/01/2020  HGBA1C 4.8 - 5.6 % 6.6(H)  Some recent data might be hidden   Assessment: 1. Porokeratosis   2. Overgrown toenails   3. Hallux valgus, acquired, bilateral   4. Acquired hammertoes of both feet   5. Diabetes mellitus without complication ([place])   6. Encounter for diabetic foot exam ([place])     ADA Risk Categorization: Low Risk :  Patient has all of the following: Intact protective sensation No prior foot ulcer  No severe deformity Pedal pulses present  Plan: -Diabetic foot  examination performed today. -Continue foot and shoe inspections daily. Monitor blood glucose per PCP/Endocrinologist's recommendations. -Painful porokeratotic lesion(s) submet head 4 right foot pared and enucleated with sterile scalpel blade without incident. Total number of lesions debrided=1. -Patient/POA to call should there be question/concern in the interim.  Return in about 9 weeks (around 07/19/2021).  Marzetta Board, DPM

## 2021-05-26 ENCOUNTER — Inpatient Hospital Stay: Admission: RE | Admit: 2021-05-26 | Payer: Self-pay | Source: Ambulatory Visit

## 2021-05-30 ENCOUNTER — Other Ambulatory Visit: Payer: Self-pay

## 2021-05-30 ENCOUNTER — Telehealth: Payer: Self-pay

## 2021-05-30 ENCOUNTER — Ambulatory Visit
Admission: EM | Admit: 2021-05-30 | Discharge: 2021-05-30 | Disposition: A | Discharge status: Home / Self Care | Payer: PPO | Attending: Student | Admitting: Student

## 2021-05-30 DIAGNOSIS — I10 Essential (primary) hypertension: Secondary | ICD-10-CM

## 2021-05-30 DIAGNOSIS — T7840XD Allergy, unspecified, subsequent encounter: Secondary | ICD-10-CM

## 2021-05-30 DIAGNOSIS — Z794 Long term (current) use of insulin: Secondary | ICD-10-CM | POA: Diagnosis not present

## 2021-05-30 DIAGNOSIS — E119 Type 2 diabetes mellitus without complications: Secondary | ICD-10-CM

## 2021-05-30 DIAGNOSIS — E2740 Unspecified adrenocortical insufficiency: Secondary | ICD-10-CM

## 2021-05-30 MED ORDER — PREDNISONE 20 MG PO TABS: 40.0000 mg | ORAL_TABLET | Freq: Every day | ORAL | 0 refills | Status: AC

## 2021-05-30 NOTE — ED Triage Notes (Signed)
Pt reports itching, redness and swelling in the left side of the face since this afternoon. Pt reports she had the same reaction 9 days ago and was gone when she was using the steroids.

## 2021-05-30 NOTE — Telephone Encounter (Signed)
Patient called stating she is experiencing pain in her left thumb and overnight has developed a cyst at the base of her thumb.  Patient states it is soft and not painful to touch.  Patient requested a return call to let her know if she needs to come in the office to have it drained or if an injection will help with the pain.

## 2021-05-30 NOTE — ED Provider Notes (Signed)
[facility]    CSN: [id number] Arrival date & time: 05/30/21  1723      History   Chief Complaint Chief Complaint  Patient presents with   Rash    HPI Tonya Hall is a 70 y.o. female presenting for recurrence of the facial swelling, redness, itching that we saw her for on 05/21/2021.  History nonspecific allergies in the past, has been followed by allergy Dr. Donneta Romberg for this before.  Also with history of diabetes, hypertension, adrenal insufficiency.  Takes 5 mg of prednisone daily at baseline.  Diabetes is well controlled, treated with insulin.  Patient states that she completed the prednisone and hydroxyzine on 2/23, and felt great for 6 days, until today.  States she felt the rash come back few hours ago, and now has swelling and itching on the left side of her face again.  Has not attempted medicines for it yet.  Denies sensation of throat closing, shortness of breath, dizziness, chest pain.  Denies new products since her last visit with [place], has stopped using the toothpaste that we thought was the problem.    HPI  Past Medical History:  Diagnosis Date   Asthma    Albuterol in haler prn   Cataract    immature unsure which eye   Chronic back pain    COVID-19 October/November 2020   Degenerative disk disease    psoriatic   Diabetes mellitus without complication (HCC)    diet controlled   Esophageal motility disorder    Non-specific, see modified barium study/speech path, BP   Fibromyalgia    GERD (gastroesophageal reflux disease)    takes Protonix bid   Headache 2019   History of blood transfusion    post c-section   History of bronchitis    HTN (hypertension)    Hyperlipidemia    takes Pravastatin daily   Lymphocytic colitis 05/26/2010   Responded to Entocort x 3 MOS   Neuropathy    Nonallergic rhinitis    Osteoporosis    gets Boniva every 3 months   Peripheral edema    takes HCTZ daily   Pneumonia 02/2019   Psoriatic arthritis ([place])    Dr.  Katherina Right   Psoriatic arthritis [facility])    bilateral hands   Raynaud's disease    Seasonal allergies    Shingles 2020   SUI (stress urinary incontinence, female)    Urinary urgency     Patient Active Problem List   Diagnosis Date Noted   Benign neoplasm of skin of lower limb 05/17/2021   History of neoplasm 05/17/2021   Melanocytic nevi of left upper limb, including shoulder 05/17/2021   Melanocytic nevi of trunk 05/17/2021   Other seborrheic keratosis 05/17/2021   Diarrhea 03/10/2021   Fracture of pelvis ([place]) 01/27/2021   Pain of left hip joint 01/06/2021   Foraminal stenosis of lumbar region 08/03/2020   Cervical spondylosis with myelopathy and radiculopathy 03/09/2020   AKI (acute kidney injury) ([place]) 02/07/2019   Acute respiratory failure with hypoxia ([place]) 02/06/2019   Acute respiratory disease due to COVID-19 virus 02/06/2019   Community acquired pneumonia 02/06/2019   Hyponatremia 02/06/2019   Asthma    Diabetes mellitus without complication ([place])    Closed nondisplaced odontoid fracture with type II morphology ([place]) 01/27/2018   Odontoid fracture ([place]) 01/24/2018   Chest pain 11/14/2017   Transaminitis 06/22/2016   Collagenous colitis 05/23/2016   Psoriasis 05/21/2016   High risk medication use 02/15/2016   Age-related osteoporosis without  current pathological fracture 02/15/2016   Trochanteric bursitis, left hip 02/15/2016   Sacroiliitis, not elsewhere classified ([place]) 02/15/2016   Chronic pain syndrome 02/15/2016   Abnormal weight gain 06/25/2012   Hypertension 07/03/2011   Hypercholesteremia 07/03/2011   Psoriatic arthritis ([place]) 07/03/2011   Osteoarthritis of multiple joints 07/03/2011   Esophageal motility disorder 02/14/2011   Cough 02/14/2011   GERD 05/05/2010    Past Surgical History:  Procedure Laterality Date   ANTERIOR CERVICAL DECOMP/DISCECTOMY FUSION N/A 03/09/2020   Procedure: ANTERIOR CERVICAL DECOMPRESSION/DISCECTOMY FUSION, INTERBODY  PROSTHESIS, PLATE SCREWS CERVICAL FIVE-SIX, CERVICAL SIX-SEVEN;  Surgeon: Newman Pies, MD;  Location: [place];  Service: Neurosurgery;  Laterality: N/A;   BACK SURGERY  2003   BIOPSY  04/17/2016   Procedure: BIOPSY;  Surgeon: Danie Binder, MD;  [facility];  Service: Endoscopy;;  random colon bx's   CERVICAL SPINE SURGERY  09/09/2017   CESAREAN SECTION  1974, 1978       COLONOSCOPY  06/19/2008   BPZ:WCHEN internal hemorrhoids/mild sigmoin colon diverticulosis   COLONOSCOPY  2012   Dr. Gala Romney: normal rectum, diverticula, lymphocytic colitis    COLONOSCOPY WITH PROPOFOL N/A 04/17/2016   Dr. Oneida Alar: Normal terminal ileum, internal/external hemorrhoids, random colon biopsies consistent with collagenous colitis   DILATION AND CURETTAGE OF UTERUS  1977   spontaneous abortion   ESOPHAGOGASTRODUODENOSCOPY  06/19/2008   IDP:OEUMPNTI gastritis   LUMBAR DISC ARTHROPLASTY  7/98   LUMBAR FUSION  6/03, 11/08, 11/14   L4-5 fusion, L2-3 fusion, L1-2   LUMBAR FUSION  08/03/2020   L4-L5   LUMBAR LAMINECTOMY  03/2020   ODONTOID SCREW INSERTION N/A 01/27/2018   Procedure: ODONTOID SCREW INSERTION;  Surgeon: Newman Pies, MD;  Location: [place];  Service: Neurosurgery;  Laterality: N/A;  ODONTOID SCREW INSERTION   TONSILLECTOMY     TONSILLECTOMY AND ADENOIDECTOMY     TUBAL LIGATION      OB History     Gravida  3   Para  2   Term  2   Preterm      AB  1   Living  2      SAB  0   IAB      Ectopic      Multiple      Live Births               Home Medications    Prior to Admission medications   Medication Sig Start Date End Date Taking? Authorizing Provider  predniSONE (DELTASONE) 20 MG tablet Take 2 tablets (40 mg total) by mouth daily for 5 days. Take with breakfast or lunch. Avoid NSAIDs (ibuprofen, etc) while taking this medication. 05/30/21 06/04/21 Yes Hazel Sams, PA-C  albuterol (PROVENTIL HFA;VENTOLIN HFA) 108 (90 BASE) MCG/ACT inhaler Inhale 2 puffs  into the lungs every 6 (six) hours as needed for wheezing or shortness of breath.     [provider]  Apremilast (OTEZLA) 30 MG TABS Take 1 tablet (30 mg total) by mouth 2 (two) times daily. 05/18/21   Ofilia Neas, PA-C  ascorbic acid (VITAMIN C) 500 MG tablet Take 500 mg by mouth 2 (two) times daily.    [provider]  aspirin 81 MG tablet Take 81 mg by mouth daily.    [provider]  azelastine (ASTELIN) 0.1 % nasal spray Place 1 spray into both nostrils 2 (two) times daily. 08/12/19   [provider]  Calcium Carb-Cholecalciferol (CALCIUM 600 + D PO) Take 1  tablet by mouth 2 (two) times daily.     [provider]  cetirizine (ZYRTEC) 10 MG tablet as needed.    [provider]  denosumab (PROLIA) 60 MG/ML SOSY injection Inject 60 mg into the skin every 6 (six) months.    [provider]  fish oil-omega-3 fatty acids 1000 MG capsule Take 1 g by mouth daily.    [provider]  gabapentin (NEURONTIN) 300 MG capsule Take 600 mg by mouth See admin instructions. Take 600 mg 3 times daily, may take a 4th 300 mg dose as needed for pain 05/05/18   [provider]  Ginger, Zingiber officinalis, (GINGER ROOT) 500 MG CAPS Take 500 mg by mouth daily.    [provider]  Glucosamine Sulfate 1000 MG CAPS Take 2 capsules (2,000 mg total) by mouth daily. 07/03/11   Zenia Resides, MD  HUMULIN N KWIKPEN 100 UNIT/ML Kiwkpen Inject 10 Units into the skin at bedtime. 03/02/20   [provider]  hydrocortisone sodium succinate (SOLU-CORTEF) 100 MG SOLR injection Inject 100 mg into the muscle daily as needed (when unable to keep down prednisone tablet).     [provider]  hydrOXYzine (ATARAX) 25 MG tablet Take 0.5-1 tablets (12.5-25 mg total) by mouth every 8 (eight) hours as needed for itching. 05/21/21   Jaynee Eagles, PA-C  Lancets (ONETOUCH DELICA PLUS [id number]) MISC USE TO TEST 3 TIMESUDAILY. 07/01/19    [provider]  leflunomide (ARAVA) 20 MG tablet TAKE 1 TABLET BY MOUTH ONCE DAILY. 05/22/21   Ofilia Neas, PA-C  losartan (COZAAR) 100 MG tablet Take 100 mg by mouth daily. 12/24/18   [provider]  metoprolol succinate (TOPROL-XL) 50 MG 24 hr tablet Take 1 tablet (50 mg total) by mouth daily. Take with or immediately following a meal. 07/03/11   Hensel, Jamal Collin, MD  Misc Natural Products (TART CHERRY ADVANCED PO) Take 1 tablet by mouth daily. daily    [provider]  Multiple Vitamin (MULTIVITAMIN) tablet Take 1 tablet by mouth daily.    [provider]  nortriptyline (PAMELOR) 25 MG capsule TAKE (1) CAPSULE BY MOUTH AT BEDTIME. Patient taking differently: Take 25 mg by mouth at bedtime. 12/05/17   Ofilia Neas, PA-C  omeprazole (PRILOSEC) 20 MG capsule Take 1 capsule (20 mg total) by mouth daily. Patient taking differently: Take 20 mg by mouth 2 (two) times daily before a meal. 12/21/20 03/21/21  Regan Lemming, MD  [facility] VERIO test strip  03/05/18   [provider]  oxybutynin (DITROPAN-XL) 5 MG 24 hr tablet Take 1 tablet (5 mg total) by mouth at bedtime. 04/07/21   Megan Salon, MD  Polyethyl Glycol-Propyl Glycol (SYSTANE OP) Apply to eye 4 (four) times daily.    [provider]  potassium chloride SA (K-DUR,KLOR-CON) 20 MEQ tablet Take 20 mEq by mouth daily.     [provider]  RUTIN PO Take 350 mg by mouth 3 (three) times daily.    [provider]  spironolactone (ALDACTONE) 25 MG tablet Take 25 mg by mouth daily.    [provider]  SURE COMFORT PEN NEEDLES 31G X 5 MM [place]  05/05/18   [provider]  TALTZ 80 MG/ML SOAJ INJECT 80MG  (1 PEN) UNDER THE SKIN EVERY 4 WEEKS 03/20/21   Ofilia Neas, PA-C  topiramate (TOPAMAX) 200 MG tablet TAKE (1) TABLET BY MOUTH AT BEDTIME. 02/22/21   Ofilia Neas, PA-C  Turmeric 500  MG CAPS Take 500 mg by mouth every morning.    [provider]   Glucosamine 500 MG TABS Take 4 tablets by mouth daily.    07/03/11  [provider]  simvastatin (ZOCOR) 40 MG tablet Take 20 mg by mouth at bedtime.   11/19/18  [provider]    Family History Family History  Problem Relation Age of Onset   Diabetes Mother    Pancreatitis Mother    Arthritis Mother        psoriatic    Fibromyalgia Mother    Rheumatic fever Father    Diabetes Other        grandparents   Skin cancer Other        grandfather   Hypertension Other        grandparent   Congestive Heart Failure Other        grandfather   Parkinson's disease Other        grandmother   Colon cancer Maternal Uncle    Fibromyalgia Sister    Rheum arthritis Sister    Diabetes Sister    Fibromyalgia Sister    Diabetes Sister    Rheum arthritis Sister    Hypertension Son    Colon polyps Neg Hx     Social History Social History   Tobacco Use   Smoking status: Never   Smokeless tobacco: Never  Vaping Use   Vaping Use: Never used  Substance Use Topics   Alcohol use: No   Drug use: No     Allergies   Adalimumab, Imuran [azathioprine sodium], Methotrexate, Plaquenil [hydroxychloroquine sulfate], Sulfonamide derivatives, Azathioprine, Ceftin [cefuroxime axetil], Cefuroxime, Morphine sulfate, and Morphine   Review of Systems Review of Systems  Skin:  Positive for rash.    Physical Exam Triage Vital Signs ED Triage Vitals  Enc Vitals Group     BP 05/30/21 1731 (!) 182/83     Pulse Rate 05/30/21 1731 (!) 102     Resp 05/30/21 1731 18     Temp 05/30/21 1731 97.8 F (36.6 C)     Temp Source 05/30/21 1731 Oral     SpO2 05/30/21 1731 94 %     Weight --      Height --      Head Circumference --      Peak Flow --      Pain Score 05/30/21 1730 0     Pain Loc --      Pain Edu? --      Excl. in [place]? --    No data found.  Updated Vital Signs BP (!) 182/83 (BP Location: Right Arm)    Pulse (!) 102    Temp 97.8 F (36.6 C) (Oral)    Resp 18    LMP  02/01/1999    SpO2 94%   Visual Acuity Right Eye Distance:   Left Eye Distance:   Bilateral Distance:    Right Eye Near:   Left Eye Near:    Bilateral Near:     Physical Exam Vitals reviewed.  Constitutional:      General: She is not in acute distress.    Appearance: Normal appearance. She is not ill-appearing or diaphoretic.  HENT:     Head: Normocephalic and atraumatic.  Cardiovascular:     Rate and Rhythm: Regular rhythm. Tachycardia present.     Heart sounds: Normal heart sounds.  Pulmonary:     Effort: Pulmonary effort is normal.     Breath sounds: Normal breath  sounds.  Skin:    General: Skin is warm.     Comments: L cheek with erythema and minimal swelling. Nontender, no warmth. No lip, tongue, uvula swelling. Uvula midline. Airway patent. Oxygenating comfortably.  Neurological:     General: No focal deficit present.     Mental Status: She is alert and oriented to person, place, and time.  Psychiatric:        Mood and Affect: Mood normal.        Behavior: Behavior normal.        Thought Content: Thought content normal.        Judgment: Judgment normal.     UC Treatments / Results  Labs (all labs ordered are listed, but only abnormal results are displayed) Labs Reviewed - No data to display  EKG   Radiology No results found.  Procedures Procedures (including critical care time)  Medications Ordered in UC Medications - No data to display  Initial Impression / Assessment and Plan / UC Course  I have reviewed the triage vital signs and the nursing notes.  Pertinent labs & imaging results that were available during my care of the patient were reviewed by me and considered in my medical decision making (see chart for details).     This patient is a very pleasant 71 y.o. year old female presenting with recurrence of L cheek swelling/ allergic reaction.  Borderline tachy but oxygenating comfortably on room air with no adventitious lung sounds, airway is  patent.  She is not having an anaphylactic reaction.  Blood pressure is elevated, attributes this to her present illness, she is taking her antihypertensives as directed.  Of note, she does take 5 mg of prednisone p.o. daily for chronic adrenal insufficiency. Diabetes is at goal and is insulin controlled; continue to monitor this.   She completed the prednisone and hydroxyzine 6 days ago, we will restart prednisone x5 days, and also recommended daily Zyrtec.  She is already followed by allergy Dr. Donneta Romberg, follow-up with him.  Head to the emergency department if new symptoms like facial swelling, sensation of throat closing, shortness of breath.  Final Clinical Impressions(s) / UC Diagnoses   Final diagnoses:  Allergic reaction, subsequent encounter  Type 2 diabetes mellitus treated with insulin (HCC)  Essential hypertension  Adrenal insufficiency (HCC)     Discharge Instructions      -Prednisone, 2 pills taken at the same time for 5 days in a row.  Try taking this earlier in the day as it can give you energy.  -Zyrtec daily.  -Hydroxyzine at night if needed. This will cause drowsiness ,. -Call Dr. Donneta Romberg for a follow-up -Continue monitoring sugars at home  -Please check your blood pressure at home or at the pharmacy. If this continues to be >140/90, follow-up with your primary care provider for further blood pressure management/ medication titration. If you develop chest pain, shortness of breath, vision changes, the worst headache of your life- head straight to the ED or call 911.    ED Prescriptions     Medication Sig Dispense Auth. Provider   predniSONE (DELTASONE) 20 MG tablet Take 2 tablets (40 mg total) by mouth daily for 5 days. Take with breakfast or lunch. Avoid NSAIDs (ibuprofen, etc) while taking this medication. 10 tablet Hazel Sams, PA-C      PDMP not reviewed this encounter.   Hazel Sams, PA-C 05/30/21 1752

## 2021-05-30 NOTE — Discharge Instructions (Addendum)
-  Prednisone, 2 pills taken at the same time for 5 days in a row.  Try taking this earlier in the day as it can give you energy.  -Zyrtec daily.  -Hydroxyzine at night if needed. This will cause drowsiness ,. -Call Dr. Donneta Romberg for a follow-up -Continue monitoring sugars at home  -Please check your blood pressure at home or at the pharmacy. If this continues to be >140/90, follow-up with your primary care provider for further blood pressure management/ medication titration. If you develop chest pain, shortness of breath, vision changes, the worst headache of your life- head straight to the ED or call 911.

## 2021-05-30 NOTE — Telephone Encounter (Signed)
Please schedule an appointment for evaluation.

## 2021-05-31 ENCOUNTER — Other Ambulatory Visit: Payer: Self-pay | Admitting: Physician Assistant

## 2021-05-31 ENCOUNTER — Other Ambulatory Visit (HOSPITAL_BASED_OUTPATIENT_CLINIC_OR_DEPARTMENT_OTHER): Payer: Self-pay | Admitting: Obstetrics & Gynecology

## 2021-05-31 NOTE — Telephone Encounter (Signed)
Next Visit: 06/19/2021 ? ?Last Visit: 03/21/2021 ? ?Last Fill: 02/22/2021 ? ?Current Dose per office note on 03/21/2021: not discussed ? ?Okay to refill Topamax?   ?

## 2021-05-31 NOTE — Telephone Encounter (Signed)
Patient scheduled for 06/01/2021 at 2:45 pm.  ?

## 2021-05-31 NOTE — Progress Notes (Unsigned)
Office Visit Note  Patient: Tonya Hall             Date of Birth: 01/24/1951           MRN: [id number]             PCP: Tonya Noble, MD Referring: Tonya Noble, MD Visit Date: 06/01/2021 Occupation: @GUAROCC @  Subjective:  No chief complaint on file.   History of Present Illness: Tonya Hall is a 71 y.o. female ***   Activities of Daily Living:  Patient reports morning stiffness for *** {minute/hour:19697}.   Patient {ACTIONS;DENIES/REPORTS:21021675::"Denies"} nocturnal pain.  Difficulty dressing/grooming: {ACTIONS;DENIES/REPORTS:21021675::"Denies"} Difficulty climbing stairs: {ACTIONS;DENIES/REPORTS:21021675::"Denies"} Difficulty getting out of chair: {ACTIONS;DENIES/REPORTS:21021675::"Denies"} Difficulty using hands for taps, buttons, cutlery, and/or writing: {ACTIONS;DENIES/REPORTS:21021675::"Denies"}  No Rheumatology ROS completed.   PMFS History:  Patient Active Problem List   Diagnosis Date Noted   Benign neoplasm of skin of lower limb 05/17/2021   History of neoplasm 05/17/2021   Melanocytic nevi of left upper limb, including shoulder 05/17/2021   Melanocytic nevi of trunk 05/17/2021   Other seborrheic keratosis 05/17/2021   Diarrhea 03/10/2021   Fracture of pelvis ([place]) 01/27/2021   Pain of left hip joint 01/06/2021   Foraminal stenosis of lumbar region 08/03/2020   Cervical spondylosis with myelopathy and radiculopathy 03/09/2020   AKI (acute kidney injury) ([place]) 02/07/2019   Acute respiratory failure with hypoxia ([place]) 02/06/2019   Acute respiratory disease due to COVID-19 virus 02/06/2019   Community acquired pneumonia 02/06/2019   Hyponatremia 02/06/2019   Asthma    Diabetes mellitus without complication ([place])    Closed nondisplaced odontoid fracture with type II morphology ([place]) 01/27/2018   Odontoid fracture ([place]) 01/24/2018   Chest pain 11/14/2017   Transaminitis 06/22/2016   Collagenous colitis 05/23/2016   Psoriasis 05/21/2016   High risk  medication use 02/15/2016   Age-related osteoporosis without current pathological fracture 02/15/2016   Trochanteric bursitis, left hip 02/15/2016   Sacroiliitis, not elsewhere classified ([place]) 02/15/2016   Chronic pain syndrome 02/15/2016   Abnormal weight gain 06/25/2012   Hypertension 07/03/2011   Hypercholesteremia 07/03/2011   Psoriatic arthritis ([place]) 07/03/2011   Osteoarthritis of multiple joints 07/03/2011   Esophageal motility disorder 02/14/2011   Cough 02/14/2011   GERD 05/05/2010    Past Medical History:  Diagnosis Date   Asthma    Albuterol in haler prn   Cataract    immature unsure which eye   Chronic back pain    COVID-19 October/November 2020   Degenerative disk disease    psoriatic   Diabetes mellitus without complication (HCC)    diet controlled   Esophageal motility disorder    Non-specific, see modified barium study/speech path, BP   Fibromyalgia    GERD (gastroesophageal reflux disease)    takes Protonix bid   Headache 2019   History of blood transfusion    post c-section   History of bronchitis    HTN (hypertension)    Hyperlipidemia    takes Pravastatin daily   Lymphocytic colitis 05/26/2010   Responded to Entocort x 3 MOS   Neuropathy    Nonallergic rhinitis    Osteoporosis    gets Boniva every 3 months   Peripheral edema    takes HCTZ daily   Pneumonia 02/2019   Psoriatic arthritis ([place])    Dr. Katherina Hall   Psoriatic arthritis [facility])    bilateral hands   Raynaud's disease    Seasonal allergies    Shingles 2020   SUI (stress  urinary incontinence, female)    Urinary urgency     Family History  Problem Relation Age of Onset   Diabetes Mother    Pancreatitis Mother    Arthritis Mother        psoriatic    Fibromyalgia Mother    Rheumatic fever Father    Diabetes Other        grandparents   Skin cancer Other        grandfather   Hypertension Other        grandparent   Congestive Heart Failure Other        grandfather    Parkinson's disease Other        grandmother   Colon cancer Maternal Uncle    Fibromyalgia Sister    Rheum arthritis Sister    Diabetes Sister    Fibromyalgia Sister    Diabetes Sister    Rheum arthritis Sister    Hypertension Son    Colon polyps Neg Hx    Past Surgical History:  Procedure Laterality Date   ANTERIOR CERVICAL DECOMP/DISCECTOMY FUSION N/A 03/09/2020   Procedure: ANTERIOR CERVICAL DECOMPRESSION/DISCECTOMY FUSION, INTERBODY PROSTHESIS, PLATE SCREWS CERVICAL FIVE-SIX, CERVICAL SIX-SEVEN;  Surgeon: Tonya Pies, MD;  Location: [place];  Service: Neurosurgery;  Laterality: N/A;   BACK SURGERY  2003   BIOPSY  04/17/2016   Procedure: BIOPSY;  Surgeon: Tonya Binder, MD;  [facility];  Service: Endoscopy;;  random colon bx's   CERVICAL SPINE SURGERY  09/09/2017   CESAREAN SECTION  1974, 1978       COLONOSCOPY  06/19/2008   [facility]:XNATF internal hemorrhoids/mild sigmoin colon diverticulosis   COLONOSCOPY  2012   Dr. Gala Hall: normal rectum, diverticula, lymphocytic colitis    COLONOSCOPY WITH PROPOFOL N/A 04/17/2016   Dr. Oneida Hall: Normal terminal ileum, internal/external hemorrhoids, random colon biopsies consistent with collagenous colitis   DILATION AND CURETTAGE OF UTERUS  1977   spontaneous abortion   ESOPHAGOGASTRODUODENOSCOPY  06/19/2008   TDD:[id number] gastritis   LUMBAR DISC ARTHROPLASTY  7/98   LUMBAR FUSION  6/03, 11/08, 11/14   L4-5 fusion, L2-3 fusion, L1-2   LUMBAR FUSION  08/03/2020   L4-L5   LUMBAR LAMINECTOMY  03/2020   ODONTOID SCREW INSERTION N/A 01/27/2018   Procedure: ODONTOID SCREW INSERTION;  Surgeon: Tonya Pies, MD;  Location: [place];  Service: Neurosurgery;  Laterality: N/A;  ODONTOID SCREW INSERTION   TONSILLECTOMY     TONSILLECTOMY AND ADENOIDECTOMY     TUBAL LIGATION     Social History   Social History Narrative   ATTENDS [employer]. 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   Immunization History  Administered Date(s)  Administered   Influenza,inj,Quad PF,6+ Mos 01/21/2017   Influenza-Unspecified 01/30/2017   Moderna Sars-Covid-2 Vaccination 06/01/2019, 07/02/2019   Pneumococcal Polysaccharide-23 02/19/2013   Tdap 09/10/2016     Objective: Vital Signs: LMP 02/01/1999    Physical Exam   Musculoskeletal Exam: ***  CDAI Exam: CDAI Score: -- Patient Global: --; Provider Global: -- Swollen: --; Tender: -- Joint Exam 06/01/2021   No joint exam has been documented for this visit   There is currently no information documented on the homunculus. Go to the Rheumatology activity and complete the homunculus joint exam.  Investigation: No additional findings.  Imaging: No results found.  Recent Labs: Lab Results  Component Value Date   WBC 13.1 (H) 03/21/2021   HGB 12.1 03/21/2021   PLT 311 03/21/2021   NA 135 03/21/2021   K 3.8 03/21/2021   CL  103 03/21/2021   CO2 23 03/21/2021   GLUCOSE 136 (H) 03/21/2021   BUN 26 (H) 03/21/2021   CREATININE 0.84 03/21/2021   BILITOT 0.3 03/21/2021   ALKPHOS 70 12/21/2020   AST 20 03/21/2021   ALT 25 03/21/2021   PROT 6.3 03/21/2021   ALBUMIN 4.4 12/21/2020   CALCIUM 9.4 03/21/2021   GFRAA 65 06/08/2020   QFTBGOLD Negative 05/05/2015   QFTBGOLDPLUS NEGATIVE 10/19/2020    Speciality Comments: MTX- high LFTs, PLQ,SSZ,Imuran, Humira- allergy, Enbrel , Remicade - inadequate response  Procedures:  No procedures performed Allergies: Adalimumab, Imuran [azathioprine sodium], Methotrexate, Plaquenil [hydroxychloroquine sulfate], Sulfonamide derivatives, Azathioprine, Ceftin [cefuroxime axetil], Cefuroxime, Morphine sulfate, and Morphine   Assessment / Plan:     Visit Diagnoses: No diagnosis found.  Orders: No orders of the defined types were placed in this encounter.  No orders of the defined types were placed in this encounter.   Face-to-face time spent with patient was *** minutes. Greater than 50% of time was spent in counseling and  coordination of care.  Follow-Up Instructions: No follow-ups on file.   Earnestine Mealing, CMA  Note - This record has been created using Editor, commissioning.  Chart creation errors have been sought, but may not always  have been located. Such creation errors do not reflect on  the standard of medical care.

## 2021-06-01 ENCOUNTER — Ambulatory Visit: Payer: PPO | Admitting: Rheumatology

## 2021-06-01 DIAGNOSIS — M5134 Other intervertebral disc degeneration, thoracic region: Secondary | ICD-10-CM

## 2021-06-01 DIAGNOSIS — E2740 Unspecified adrenocortical insufficiency: Secondary | ICD-10-CM

## 2021-06-01 DIAGNOSIS — D692 Other nonthrombocytopenic purpura: Secondary | ICD-10-CM

## 2021-06-01 DIAGNOSIS — K224 Dyskinesia of esophagus: Secondary | ICD-10-CM

## 2021-06-01 DIAGNOSIS — M1711 Unilateral primary osteoarthritis, right knee: Secondary | ICD-10-CM

## 2021-06-01 DIAGNOSIS — G894 Chronic pain syndrome: Secondary | ICD-10-CM

## 2021-06-01 DIAGNOSIS — M5136 Other intervertebral disc degeneration, lumbar region: Secondary | ICD-10-CM

## 2021-06-01 DIAGNOSIS — L4050 Arthropathic psoriasis, unspecified: Secondary | ICD-10-CM

## 2021-06-01 DIAGNOSIS — L409 Psoriasis, unspecified: Secondary | ICD-10-CM

## 2021-06-01 DIAGNOSIS — Z8719 Personal history of other diseases of the digestive system: Secondary | ICD-10-CM

## 2021-06-01 DIAGNOSIS — I7300 Raynaud's syndrome without gangrene: Secondary | ICD-10-CM

## 2021-06-01 DIAGNOSIS — G8929 Other chronic pain: Secondary | ICD-10-CM

## 2021-06-01 DIAGNOSIS — Z79899 Other long term (current) drug therapy: Secondary | ICD-10-CM

## 2021-06-01 DIAGNOSIS — M5030 Other cervical disc degeneration, unspecified cervical region: Secondary | ICD-10-CM

## 2021-06-01 DIAGNOSIS — M810 Age-related osteoporosis without current pathological fracture: Secondary | ICD-10-CM

## 2021-06-01 DIAGNOSIS — M7061 Trochanteric bursitis, right hip: Secondary | ICD-10-CM

## 2021-06-01 DIAGNOSIS — K52831 Collagenous colitis: Secondary | ICD-10-CM

## 2021-06-01 DIAGNOSIS — R600 Localized edema: Secondary | ICD-10-CM

## 2021-06-01 DIAGNOSIS — Z8639 Personal history of other endocrine, nutritional and metabolic disease: Secondary | ICD-10-CM

## 2021-06-01 DIAGNOSIS — I10 Essential (primary) hypertension: Secondary | ICD-10-CM

## 2021-06-02 ENCOUNTER — Other Ambulatory Visit: Payer: Self-pay

## 2021-06-02 ENCOUNTER — Ambulatory Visit (INDEPENDENT_AMBULATORY_CARE_PROVIDER_SITE_OTHER)
Admission: RE | Admit: 2021-06-02 | Discharge: 2021-06-02 | Disposition: A | Discharge status: Home / Self Care | Payer: Self-pay | Source: Ambulatory Visit | Attending: Internal Medicine | Admitting: Internal Medicine

## 2021-06-02 DIAGNOSIS — E785 Hyperlipidemia, unspecified: Secondary | ICD-10-CM

## 2021-06-05 NOTE — Progress Notes (Unsigned)
Office Visit Note  Patient: Tonya Hall             Date of Birth: July 21, 1950           MRN: [id number]             PCP: [name], MD Referring: [name], MD Visit Date: 06/19/2021 Occupation: '@GUAROCC'$ @  Subjective:  No chief complaint on file.   History of Present Illness: Tonya Hall is a 71 y.o. female ***   Activities of Daily Living:  Patient reports morning stiffness for *** {minute/hour:19697}.   Patient {ACTIONS;DENIES/REPORTS:21021675::"Denies"} nocturnal pain.  Difficulty dressing/grooming: {ACTIONS;DENIES/REPORTS:21021675::"Denies"} Difficulty climbing stairs: {ACTIONS;DENIES/REPORTS:21021675::"Denies"} Difficulty getting out of chair: {ACTIONS;DENIES/REPORTS:21021675::"Denies"} Difficulty using hands for taps, buttons, cutlery, and/or writing: {ACTIONS;DENIES/REPORTS:21021675::"Denies"}  No Rheumatology ROS completed.   PMFS History:  Patient Active Problem List   Diagnosis Date Noted   Benign neoplasm of skin of lower limb 05/17/2021   History of neoplasm 05/17/2021   Melanocytic nevi of left upper limb, including shoulder 05/17/2021   Melanocytic nevi of trunk 05/17/2021   Other seborrheic keratosis 05/17/2021   Diarrhea 03/10/2021   Fracture of pelvis ([place]) 01/27/2021   Pain of left hip joint 01/06/2021   Foraminal stenosis of lumbar region 08/03/2020   Cervical spondylosis with myelopathy and radiculopathy 03/09/2020   AKI (acute kidney injury) ([place]) 02/07/2019   Acute respiratory failure with hypoxia ([place]) 02/06/2019   Acute respiratory disease due to COVID-19 virus 02/06/2019   Community acquired pneumonia 02/06/2019   Hyponatremia 02/06/2019   Asthma    Diabetes mellitus without complication ([place])    Closed nondisplaced odontoid fracture with type II morphology ([place]) 01/27/2018   Odontoid fracture ([place]) 01/24/2018   Chest pain 11/14/2017   Transaminitis 06/22/2016   Collagenous colitis 05/23/2016   Psoriasis 05/21/2016   High risk  medication use 02/15/2016   Age-related osteoporosis without current pathological fracture 02/15/2016   Trochanteric bursitis, left hip 02/15/2016   Sacroiliitis, not elsewhere classified ([place]) 02/15/2016   Chronic pain syndrome 02/15/2016   Abnormal weight gain 06/25/2012   Hypertension 07/03/2011   Hypercholesteremia 07/03/2011   Psoriatic arthritis ([place]) 07/03/2011   Osteoarthritis of multiple joints 07/03/2011   Esophageal motility disorder 02/14/2011   Cough 02/14/2011   GERD 05/05/2010    Past Medical History:  Diagnosis Date   Asthma    Albuterol in haler prn   Cataract    immature unsure which eye   Chronic back pain    COVID-19 October/November 2020   Degenerative disk disease    psoriatic   Diabetes mellitus without complication (HCC)    diet controlled   Esophageal motility disorder    Non-specific, see modified barium study/speech path, BP   Fibromyalgia    GERD (gastroesophageal reflux disease)    takes Protonix bid   Headache 2019   History of blood transfusion    post c-section   History of bronchitis    HTN (hypertension)    Hyperlipidemia    takes Pravastatin daily   Lymphocytic colitis 05/26/2010   Responded to Entocort x 3 MOS   Neuropathy    Nonallergic rhinitis    Osteoporosis    gets Boniva every 3 months   Peripheral edema    takes HCTZ daily   Pneumonia 02/2019   Psoriatic arthritis ([place])    Dr. Katherina Right   Psoriatic arthritis [facility])    bilateral hands   Raynaud's disease    Seasonal allergies    Shingles 2020   SUI (stress  urinary incontinence, female)    Urinary urgency     Family History  Problem Relation Age of Onset   Diabetes Mother    Pancreatitis Mother    Arthritis Mother        psoriatic    Fibromyalgia Mother    Rheumatic fever Father    Diabetes Other        grandparents   Skin cancer Other        grandfather   Hypertension Other        grandparent   Congestive Heart Failure Other        grandfather    Parkinson's disease Other        grandmother   Colon cancer Maternal Uncle    Fibromyalgia Sister    Rheum arthritis Sister    Diabetes Sister    Fibromyalgia Sister    Diabetes Sister    Rheum arthritis Sister    Hypertension Son    Colon polyps Neg Hx    Past Surgical History:  Procedure Laterality Date   ANTERIOR CERVICAL DECOMP/DISCECTOMY FUSION N/A 03/09/2020   Procedure: ANTERIOR CERVICAL DECOMPRESSION/DISCECTOMY FUSION, INTERBODY PROSTHESIS, PLATE SCREWS CERVICAL FIVE-SIX, CERVICAL SIX-SEVEN;  Surgeon: Newman Pies, MD;  Location: [place];  Service: Neurosurgery;  Laterality: N/A;   BACK SURGERY  2003   BIOPSY  04/17/2016   Procedure: BIOPSY;  Surgeon: Danie Binder, MD;  [facility];  Service: Endoscopy;;  random colon bx's   CERVICAL SPINE SURGERY  09/09/2017   CESAREAN SECTION  1974, 1978       COLONOSCOPY  06/19/2008   [facility]:[id number] internal hemorrhoids/mild sigmoin colon diverticulosis   COLONOSCOPY  2012   Dr. Gala Romney: normal rectum, diverticula, lymphocytic colitis    COLONOSCOPY WITH PROPOFOL N/A 04/17/2016   Dr. Oneida Alar: Normal terminal ileum, internal/external hemorrhoids, random colon biopsies consistent with collagenous colitis   DILATION AND CURETTAGE OF UTERUS  1977   spontaneous abortion   ESOPHAGOGASTRODUODENOSCOPY  06/19/2008   TGG:[id number] gastritis   LUMBAR DISC ARTHROPLASTY  7/98   LUMBAR FUSION  6/03, 11/08, 11/14   L4-5 fusion, L2-3 fusion, L1-2   LUMBAR FUSION  08/03/2020   L4-L5   LUMBAR LAMINECTOMY  03/2020   ODONTOID SCREW INSERTION N/A 01/27/2018   Procedure: ODONTOID SCREW INSERTION;  Surgeon: Newman Pies, MD;  Location: [facility];  Service: Neurosurgery;  Laterality: N/A;  ODONTOID SCREW INSERTION   TONSILLECTOMY     TONSILLECTOMY AND ADENOIDECTOMY     TUBAL LIGATION     Social History   Social History Narrative   ATTENDS [employer]. 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   Immunization History  Administered Date(s)  Administered   Influenza,inj,Quad PF,6+ Mos 01/21/2017   Influenza-Unspecified 01/30/2017   Moderna Sars-Covid-2 Vaccination 06/01/2019, 07/02/2019   Pneumococcal Polysaccharide-23 02/19/2013   Tdap 09/10/2016     Objective: Vital Signs: LMP 02/01/1999    Physical Exam   Musculoskeletal Exam: ***  CDAI Exam: CDAI Score: -- Patient Global: --; Provider Global: -- Swollen: --; Tender: -- Joint Exam 06/19/2021   No joint exam has been documented for this visit   There is currently no information documented on the homunculus. Go to the Rheumatology activity and complete the homunculus joint exam.  Investigation: No additional findings.  Imaging: CT CARDIAC SCORING (SELF PAY ONLY)  Addendum Date: 06/02/2021   ADDENDUM REPORT: 06/02/2021 13:20 CLINICAL DATA:  39F for cardiovascular disease risk stratification EXAM: Coronary Calcium Score TECHNIQUE: A gated, non-contrast computed tomography scan of the heart was performed  using 80m slice thickness. Axial images were analyzed on a dedicated workstation. Calcium scoring of the coronary arteries was performed using the Agatston method. FINDINGS: Coronary arteries: Normal origins. Coronary Calcium Score: Left main: 0 Left anterior descending artery: 140 Left circumflex artery: 0 Right coronary artery: 0 Total: 140 Percentile: 75th Pericardium: Normal. Ascending Aorta: Normal caliber. Non-cardiac: See separate [facility]. IMPRESSION: Coronary calcium score of 140. This was 75th percentile for age-, race-, and sex-matched controls. RECOMMENDATIONS: Coronary artery calcium (CAC) score is a strong predictor of incident coronary heart disease (CHD) and provides predictive information beyond traditional risk factors. CAC scoring is reasonable to use in the decision to withhold, postpone, or initiate statin therapy in intermediate-risk or selected borderline-risk asymptomatic adults (age 72-75years and LDL-C >=70 to <190 mg/dL) who  do not have diabetes or established atherosclerotic cardiovascular disease (ASCVD).* In intermediate-risk (10-year ASCVD risk >=7.5% to <20%) adults or selected borderline-risk (10-year ASCVD risk >=5% to <7.5%) adults in whom a CAC score is measured for the purpose of making a treatment decision the following recommendations have been made: If CAC=0, it is reasonable to withhold statin therapy and reassess in 5 to 10 years, as long as higher risk conditions are absent (diabetes mellitus, family history of premature CHD in first degree relatives (males <55 years; females <65 years), cigarette smoking, or LDL >=190 mg/dL). If CAC is 1 to 99, it is reasonable to initiate statin therapy for patients >=585years of age. If CAC is >=100 or >=75th percentile, it is reasonable to initiate statin therapy at any age. Cardiology referral should be considered for patients with CAC scores >=400 or >=75th percentile. *2018 AHA/ACC/AACVPR/AAPA/ABC/ACPM/ADA/AGS/APhA/ASPC/NLA/PCNA Guideline on the Management of Blood Cholesterol: A Report of the American College of Cardiology/American Heart Association Task Force on Clinical Practice Guidelines. J Am Coll Cardiol. 2019;73(24):3168-3209. TSkeet Latch MD Electronically Signed   By: TSkeet LatchM.D.   On: 06/02/2021 13:20   Result Date: 06/02/2021 EXAM: OVER-READ INTERPRETATION  CT CHEST The following report is an over-read performed by radiologist Dr. GAletta Edouardof [facility], [place] 06/02/2021. This over-read does not include interpretation of cardiac or coronary anatomy or pathology. The coronary calcium score interpretation by the cardiologist is attached. COMPARISON:  CT of the chest on 08/01/2020 FINDINGS: Vascular: Atherosclerosis of the descending thoracic aorta. Mediastinum/Nodes: Visualized mediastinum and hilar regions demonstrate no lymphadenopathy or masses. Small anterior mediastinal lymph node on image 13 measures 6 mm in short axis and is not  enlarged. Lungs/Pleura: Visualized lungs show no evidence of pulmonary edema, consolidation, pneumothorax, nodule or pleural fluid. Upper Abdomen: No acute abnormality. Musculoskeletal: Stable old bilateral posterior rib fractures. IMPRESSION: 1. Atherosclerosis of the descending thoracic aorta. 2. Stable old bilateral posterior rib fractures. Electronically Signed: By: GAletta EdouardM.D. On: 06/02/2021 11:28    Recent Labs: Lab Results  Component Value Date   WBC 13.1 (H) 03/21/2021   HGB 12.1 03/21/2021   PLT 311 03/21/2021   NA 135 03/21/2021   K 3.8 03/21/2021   CL 103 03/21/2021   CO2 23 03/21/2021   GLUCOSE 136 (H) 03/21/2021   BUN 26 (H) 03/21/2021   CREATININE 0.84 03/21/2021   BILITOT 0.3 03/21/2021   ALKPHOS 70 12/21/2020   AST 20 03/21/2021   ALT 25 03/21/2021   PROT 6.3 03/21/2021   ALBUMIN 4.4 12/21/2020   CALCIUM 9.4 03/21/2021   GFRAA 65 06/08/2020   QFTBGOLD Negative 05/05/2015   QFTBGOLDPLUS NEGATIVE 10/19/2020  Speciality Comments: MTX- high LFTs, PLQ,SSZ,Imuran, Humira- allergy, Enbrel , Remicade - inadequate response  Procedures:  No procedures performed Allergies: Adalimumab, Imuran [azathioprine sodium], Methotrexate, Plaquenil [hydroxychloroquine sulfate], Sulfonamide derivatives, Azathioprine, Ceftin [cefuroxime axetil], Cefuroxime, Morphine sulfate, and Morphine   Assessment / Plan:     Visit Diagnoses: No diagnosis found.  Orders: No orders of the defined types were placed in this encounter.  No orders of the defined types were placed in this encounter.   Face-to-face time spent with patient was *** minutes. Greater than 50% of time was spent in counseling and coordination of care.  Follow-Up Instructions: No follow-ups on file.   Earnestine Mealing, CMA  Note - This record has been created using Editor, commissioning.  Chart creation errors have been sought, but may not always  have been located. Such creation errors do not reflect on  the  standard of medical care.

## 2021-06-07 ENCOUNTER — Other Ambulatory Visit (HOSPITAL_BASED_OUTPATIENT_CLINIC_OR_DEPARTMENT_OTHER): Payer: Self-pay | Admitting: *Deleted

## 2021-06-07 ENCOUNTER — Encounter (HOSPITAL_BASED_OUTPATIENT_CLINIC_OR_DEPARTMENT_OTHER): Payer: Self-pay | Admitting: Obstetrics & Gynecology

## 2021-06-07 MED ORDER — OXYBUTYNIN CHLORIDE ER 5 MG PO TB24: 5.0000 mg | ORAL_TABLET | Freq: Every day | ORAL | 0 refills | Status: DC

## 2021-06-19 ENCOUNTER — Encounter: Payer: Self-pay | Admitting: Physician Assistant

## 2021-06-19 ENCOUNTER — Other Ambulatory Visit: Payer: Self-pay

## 2021-06-19 ENCOUNTER — Ambulatory Visit: Payer: PPO | Admitting: Physician Assistant

## 2021-06-19 VITALS — BP 159/80 | HR 98 | Ht 59.0 in | Wt 135.0 lb

## 2021-06-19 DIAGNOSIS — M7061 Trochanteric bursitis, right hip: Secondary | ICD-10-CM

## 2021-06-19 DIAGNOSIS — R600 Localized edema: Secondary | ICD-10-CM

## 2021-06-19 DIAGNOSIS — M533 Sacrococcygeal disorders, not elsewhere classified: Secondary | ICD-10-CM | POA: Diagnosis not present

## 2021-06-19 DIAGNOSIS — M7062 Trochanteric bursitis, left hip: Secondary | ICD-10-CM | POA: Diagnosis not present

## 2021-06-19 DIAGNOSIS — Z8719 Personal history of other diseases of the digestive system: Secondary | ICD-10-CM

## 2021-06-19 DIAGNOSIS — M1711 Unilateral primary osteoarthritis, right knee: Secondary | ICD-10-CM | POA: Diagnosis not present

## 2021-06-19 DIAGNOSIS — Z111 Encounter for screening for respiratory tuberculosis: Secondary | ICD-10-CM

## 2021-06-19 DIAGNOSIS — M810 Age-related osteoporosis without current pathological fracture: Secondary | ICD-10-CM

## 2021-06-19 DIAGNOSIS — L4050 Arthropathic psoriasis, unspecified: Secondary | ICD-10-CM

## 2021-06-19 DIAGNOSIS — G894 Chronic pain syndrome: Secondary | ICD-10-CM

## 2021-06-19 DIAGNOSIS — L409 Psoriasis, unspecified: Secondary | ICD-10-CM

## 2021-06-19 DIAGNOSIS — D692 Other nonthrombocytopenic purpura: Secondary | ICD-10-CM

## 2021-06-19 DIAGNOSIS — M5030 Other cervical disc degeneration, unspecified cervical region: Secondary | ICD-10-CM | POA: Diagnosis not present

## 2021-06-19 DIAGNOSIS — E2740 Unspecified adrenocortical insufficiency: Secondary | ICD-10-CM

## 2021-06-19 DIAGNOSIS — M5134 Other intervertebral disc degeneration, thoracic region: Secondary | ICD-10-CM

## 2021-06-19 DIAGNOSIS — Z79899 Other long term (current) drug therapy: Secondary | ICD-10-CM | POA: Diagnosis not present

## 2021-06-19 DIAGNOSIS — M5136 Other intervertebral disc degeneration, lumbar region: Secondary | ICD-10-CM | POA: Diagnosis not present

## 2021-06-19 DIAGNOSIS — Z8639 Personal history of other endocrine, nutritional and metabolic disease: Secondary | ICD-10-CM

## 2021-06-19 DIAGNOSIS — I7300 Raynaud's syndrome without gangrene: Secondary | ICD-10-CM

## 2021-06-19 DIAGNOSIS — K52831 Collagenous colitis: Secondary | ICD-10-CM

## 2021-06-19 DIAGNOSIS — K224 Dyskinesia of esophagus: Secondary | ICD-10-CM

## 2021-06-19 DIAGNOSIS — I10 Essential (primary) hypertension: Secondary | ICD-10-CM

## 2021-06-19 DIAGNOSIS — G8929 Other chronic pain: Secondary | ICD-10-CM

## 2021-06-19 MED ORDER — LIDOCAINE HCL 1 % IJ SOLN
1.5000 mL | INTRAMUSCULAR | Status: AC | PRN
  Administered 2021-06-19: 1.5 mL

## 2021-06-19 MED ORDER — TRIAMCINOLONE ACETONIDE 40 MG/ML IJ SUSP
40.0000 mg | INTRAMUSCULAR | Status: AC | PRN
  Administered 2021-06-19: 40 mg via INTRA_ARTICULAR

## 2021-06-20 LAB — CBC WITH DIFFERENTIAL/PLATELET
Absolute Monocytes: 917 cells/uL (ref 200–950)
Basophils Absolute: 118 cells/uL (ref 0–200)
Basophils Relative: 0.9 %
Eosinophils Absolute: 511 cells/uL — ABNORMAL HIGH (ref 15–500)
Eosinophils Relative: 3.9 %
HCT: 40.2 % (ref 35.0–45.0)
Hemoglobin: 13.1 g/dL (ref 11.7–15.5)
Lymphs Abs: 1742 cells/uL (ref 850–3900)
MCH: 30.5 pg (ref 27.0–33.0)
MCHC: 32.6 g/dL (ref 32.0–36.0)
MCV: 93.5 fL (ref 80.0–100.0)
MPV: 11.2 fL (ref 7.5–12.5)
Monocytes Relative: 7 %
Neutro Abs: 9812 cells/uL — ABNORMAL HIGH (ref 1500–7800)
Neutrophils Relative %: 74.9 %
Platelets: 301 10*3/uL (ref 140–400)
RBC: 4.3 10*6/uL (ref 3.80–5.10)
RDW: 12.9 % (ref 11.0–15.0)
Total Lymphocyte: 13.3 %
WBC: 13.1 10*3/uL — ABNORMAL HIGH (ref 3.8–10.8)

## 2021-06-20 LAB — COMPLETE METABOLIC PANEL WITH GFR
AG Ratio: 1.8 (calc) (ref 1.0–2.5)
ALT: 28 U/L (ref 6–29)
AST: 24 U/L (ref 10–35)
Albumin: 4.2 g/dL (ref 3.6–5.1)
Alkaline phosphatase (APISO): 60 U/L (ref 37–153)
BUN/Creatinine Ratio: 30 (calc) — ABNORMAL HIGH (ref 6–22)
BUN: 27 mg/dL — ABNORMAL HIGH (ref 7–25)
CO2: 22 mmol/L (ref 20–32)
Calcium: 10 mg/dL (ref 8.6–10.4)
Chloride: 105 mmol/L (ref 98–110)
Creat: 0.89 mg/dL (ref 0.60–1.00)
Globulin: 2.3 g/dL (calc) (ref 1.9–3.7)
Glucose, Bld: 141 mg/dL — ABNORMAL HIGH (ref 65–99)
Potassium: 4 mmol/L (ref 3.5–5.3)
Sodium: 137 mmol/L (ref 135–146)
Total Bilirubin: 0.3 mg/dL (ref 0.2–1.2)
Total Protein: 6.5 g/dL (ref 6.1–8.1)
eGFR: 69 mL/min/{1.73_m2} (ref >=60)

## 2021-06-20 NOTE — Progress Notes (Signed)
Labs are stable.  No changes at this time.

## 2021-06-20 NOTE — Progress Notes (Signed)
WBC count remains elevated-13.1.  absolute neutrophils and absolute eosinophils remain slightly elevated.  ?Glucose is elevated-141.  BUN is elevated-27.  Rest of CMP WNL.

## 2021-06-21 DIAGNOSIS — H61001 Unspecified perichondritis of right external ear: Secondary | ICD-10-CM | POA: Diagnosis not present

## 2021-06-21 DIAGNOSIS — D485 Neoplasm of uncertain behavior of skin: Secondary | ICD-10-CM | POA: Diagnosis not present

## 2021-07-04 ENCOUNTER — Telehealth: Payer: Self-pay

## 2021-07-04 NOTE — Telephone Encounter (Signed)
Patient called stating she has increased pain in her left thumb, as well as a cyst where it connects to her wrist.  Patient requested a return call to let her know if she can get an injection.  Patient states she is not available this afternoon, but can come in any other day this week.  Please advise. ?

## 2021-07-04 NOTE — Progress Notes (Signed)
? ?Office Visit Note ? ?Patient: Tonya Hall             ?Date of Birth: 1950/07/08           ?MRN: [id number]             ?PCP: [name], MD ?Referring: [name], MD ?Visit Date: 07/05/2021 ?Occupation: '@GUAROCC'$ @ ? ?Subjective:  ?Left thumb pain  ? ?History of Present Illness: Tonya Hall is a 71 y.o. female with history of psoriatic arthritis, osteoarthritis and osteoporosis.  She returns today after her last visit on June 19, 2021.  She was seen on urgent basis due to severe pain and discomfort in her left thumb.  She describes discomfort over her left CMC joint.  She denies joint swelling.  She states her symptoms are controlled on the combination of [place].  She still takes prednisone 5 mg daily.  She also describes discomfort in the left mid thumb.  She has some discomfort in her right thumb but not as much as the left thumb.  She states about 5 weeks ago she developed a rash and swelling on her face.  She was given a prednisone Dosepak by urgent care.  A week later she had another Dosepak.  Rash gradually resolved.  She has an appointment with the allergist towards the end of April. ? ?Activities of Daily Living:  ?Patient reports morning stiffness for all day. ?Patient Reports nocturnal pain.  ?Difficulty dressing/grooming: Reports ?Difficulty climbing stairs: Reports ?Difficulty getting out of chair: Reports ?Difficulty using hands for taps, buttons, cutlery, and/or writing: Reports ? ?Review of Systems  ?Constitutional:  Positive for fatigue.  ?HENT:  Positive for mouth dryness and nose dryness. Negative for mouth sores.   ?Eyes:  Positive for dryness. Negative for pain and itching.  ?Respiratory:  Negative for shortness of breath and difficulty breathing.   ?Cardiovascular:  Negative for chest pain and palpitations.  ?Gastrointestinal:  Negative for blood in stool, constipation and diarrhea.  ?Endocrine: Positive for increased urination.  ?Genitourinary:  Negative for  difficulty urinating.  ?Musculoskeletal:  Positive for joint pain, joint pain, joint swelling, myalgias, morning stiffness, muscle tenderness and myalgias.  ?Skin:  Positive for color change and redness. Negative for rash.  ?Allergic/Immunologic: Negative for susceptible to infections.  ?Neurological:  Positive for numbness and weakness. Negative for dizziness, headaches and memory loss.  ?Hematological:  Positive for bruising/bleeding tendency.  ?Psychiatric/Behavioral:  Negative for confusion.   ? ?PMFS History:  ?Patient Active Problem List  ? Diagnosis Date Noted  ? Benign neoplasm of skin of lower limb 05/17/2021  ? History of neoplasm 05/17/2021  ? Melanocytic nevi of left upper limb, including shoulder 05/17/2021  ? Melanocytic nevi of trunk 05/17/2021  ? Other seborrheic keratosis 05/17/2021  ? Diarrhea 03/10/2021  ? Fracture of pelvis ([place]) 01/27/2021  ? Pain of left hip joint 01/06/2021  ? Foraminal stenosis of lumbar region 08/03/2020  ? Cervical spondylosis with myelopathy and radiculopathy 03/09/2020  ? AKI (acute kidney injury) ([place]) 02/07/2019  ? Acute respiratory failure with hypoxia ([place]) 02/06/2019  ? Acute respiratory disease due to COVID-19 virus 02/06/2019  ? Community acquired pneumonia 02/06/2019  ? Hyponatremia 02/06/2019  ? Asthma   ? Diabetes mellitus without complication ([place])   ? Closed nondisplaced odontoid fracture with type II morphology ([place]) 01/27/2018  ? Odontoid fracture ([place]) 01/24/2018  ? Chest pain 11/14/2017  ? Transaminitis 06/22/2016  ? Collagenous colitis 05/23/2016  ? Psoriasis 05/21/2016  ?  High risk medication use 02/15/2016  ? Age-related osteoporosis without current pathological fracture 02/15/2016  ? Trochanteric bursitis, left hip 02/15/2016  ? Sacroiliitis, not elsewhere classified ([place]) 02/15/2016  ? Chronic pain syndrome 02/15/2016  ? Abnormal weight gain 06/25/2012  ? Hypertension 07/03/2011  ? Hypercholesteremia 07/03/2011  ? Psoriatic arthritis ([place]) 07/03/2011   ? Osteoarthritis of multiple joints 07/03/2011  ? Esophageal motility disorder 02/14/2011  ? Cough 02/14/2011  ? GERD 05/05/2010  ?  ?Past Medical History:  ?Diagnosis Date  ? Asthma   ? Albuterol in haler prn  ? Cataract   ? immature unsure which eye  ? Chronic back pain   ? COVID-19 October/November 2020  ? Degenerative disk disease   ? psoriatic  ? Diabetes mellitus without complication ([place])   ? diet controlled  ? Esophageal motility disorder   ? Non-specific, see modified barium study/speech path, BP  ? Fibromyalgia   ? GERD (gastroesophageal reflux disease)   ? takes Protonix bid  ? Headache 2019  ? History of blood transfusion   ? post c-section  ? History of bronchitis   ? HTN (hypertension)   ? Hyperlipidemia   ? takes Pravastatin daily  ? Lymphocytic colitis 05/26/2010  ? Responded to Entocort x 3 MOS  ? Neuropathy   ? Nonallergic rhinitis   ? Osteoporosis   ? gets Boniva every 3 months  ? Peripheral edema   ? takes HCTZ daily  ? Pneumonia 02/2019  ? Psoriatic arthritis ([place])   ? Dr. Katherina Right  ? Psoriatic arthritis ([place])   ? bilateral hands  ? Raynaud's disease   ? Seasonal allergies   ? Shingles 2020  ? SUI (stress urinary incontinence, female)   ? Urinary urgency   ?  ?Family History  ?Problem Relation Age of Onset  ? Diabetes Mother   ? Pancreatitis Mother   ? Arthritis Mother   ?     psoriatic   ? Fibromyalgia Mother   ? Rheumatic fever Father   ? Diabetes Other   ?     grandparents  ? Skin cancer Other   ?     grandfather  ? Hypertension Other   ?     grandparent  ? Congestive Heart Failure Other   ?     grandfather  ? Parkinson's disease Other   ?     grandmother  ? Colon cancer Maternal Uncle   ? Fibromyalgia Sister   ? Rheum arthritis Sister   ? Diabetes Sister   ? Fibromyalgia Sister   ? Diabetes Sister   ? Rheum arthritis Sister   ? Hypertension Son   ? Colon polyps Neg Hx   ? ?Past Surgical History:  ?Procedure Laterality Date  ? ANTERIOR CERVICAL DECOMP/DISCECTOMY FUSION N/A 03/09/2020  ?  Procedure: ANTERIOR CERVICAL DECOMPRESSION/DISCECTOMY FUSION, INTERBODY PROSTHESIS, PLATE SCREWS CERVICAL FIVE-SIX, CERVICAL SIX-SEVEN;  Surgeon: Newman Pies, MD;  Location: [facility];  Service: Neurosurgery;  Laterality: N/A;  ? BACK SURGERY  2003  ? BIOPSY  04/17/2016  ? Procedure: BIOPSY;  Surgeon: Danie Binder, MD;  [facility];  Service: Endoscopy;;  random colon bx's  ? CERVICAL SPINE SURGERY  09/09/2017  ? CESAREAN SECTION  1974, 1978  ?    ? COLONOSCOPY  06/19/2008  ? [id number] internal hemorrhoids/mild sigmoin colon diverticulosis  ? COLONOSCOPY  2012  ? Dr. Gala Romney: normal rectum, diverticula, lymphocytic colitis   ? COLONOSCOPY WITH PROPOFOL N/A 04/17/2016  ? Dr. Oneida Alar: Normal terminal ileum, internal/external  hemorrhoids, random colon biopsies consistent with collagenous colitis  ? DILATION AND CURETTAGE OF UTERUS  1977  ? spontaneous abortion  ? ESOPHAGOGASTRODUODENOSCOPY  06/19/2008  ? [id number] gastritis  ? LUMBAR DISC ARTHROPLASTY  7/98  ? LUMBAR FUSION  6/03, 11/08, 11/14  ? L4-5 fusion, L2-3 fusion, L1-2  ? LUMBAR FUSION  08/03/2020  ? L4-L5  ? LUMBAR LAMINECTOMY  03/2020  ? ODONTOID SCREW INSERTION N/A 01/27/2018  ? Procedure: ODONTOID SCREW INSERTION;  Surgeon: Newman Pies, MD;  Location: [facility];  Service: Neurosurgery;  Laterality: N/A;  ODONTOID SCREW INSERTION  ? TONSILLECTOMY    ? TONSILLECTOMY AND ADENOIDECTOMY    ? TUBAL LIGATION    ? ?Social History  ? ?Social History Narrative  ? ATT[employer]. 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  ? ?Immunization History  ?Administered Date(s) Administered  ? Influenza,inj,Quad PF,6+ Mos 01/21/2017  ? Influenza-Unspecified 01/30/2017  ? Moderna Sars-Covid-2 Vaccination 06/01/2019, 07/02/2019  ? Pneumococcal Polysaccharide-23 02/19/2013  ? Tdap 09/10/2016  ?  ? ?Objective: ?Vital Signs: BP (!) 167/78 (BP Location: Left Arm, Patient Position: Sitting, Cuff Size: Normal)   Pulse 81   Ht '4\' 11"'$  (1.499 m)   Wt 135 lb 9.6 oz (61.5  kg)   LMP 02/01/1999   BMI 27.39 kg/m?   ? ?Physical Exam ?Vitals and nursing note reviewed.  ?Constitutional:   ?   Appearance: She is well-developed.  ?HENT:  ?   Head: Normocephalic and atraumatic.  ?Eyes:  ?

## 2021-07-04 NOTE — Telephone Encounter (Signed)
I called patient, appt 07/05/2021 ?

## 2021-07-05 ENCOUNTER — Encounter: Payer: Self-pay | Admitting: Rheumatology

## 2021-07-05 ENCOUNTER — Ambulatory Visit (INDEPENDENT_AMBULATORY_CARE_PROVIDER_SITE_OTHER): Payer: PPO

## 2021-07-05 ENCOUNTER — Ambulatory Visit: Payer: PPO | Admitting: Rheumatology

## 2021-07-05 VITALS — BP 167/78 | HR 81 | Ht 59.0 in | Wt 135.6 lb

## 2021-07-05 DIAGNOSIS — G8929 Other chronic pain: Secondary | ICD-10-CM | POA: Diagnosis not present

## 2021-07-05 DIAGNOSIS — L409 Psoriasis, unspecified: Secondary | ICD-10-CM | POA: Diagnosis not present

## 2021-07-05 DIAGNOSIS — M7061 Trochanteric bursitis, right hip: Secondary | ICD-10-CM

## 2021-07-05 DIAGNOSIS — M1711 Unilateral primary osteoarthritis, right knee: Secondary | ICD-10-CM

## 2021-07-05 DIAGNOSIS — M5030 Other cervical disc degeneration, unspecified cervical region: Secondary | ICD-10-CM

## 2021-07-05 DIAGNOSIS — K224 Dyskinesia of esophagus: Secondary | ICD-10-CM

## 2021-07-05 DIAGNOSIS — M79645 Pain in left finger(s): Secondary | ICD-10-CM

## 2021-07-05 DIAGNOSIS — Z79899 Other long term (current) drug therapy: Secondary | ICD-10-CM

## 2021-07-05 DIAGNOSIS — I10 Essential (primary) hypertension: Secondary | ICD-10-CM

## 2021-07-05 DIAGNOSIS — M810 Age-related osteoporosis without current pathological fracture: Secondary | ICD-10-CM

## 2021-07-05 DIAGNOSIS — M5136 Other intervertebral disc degeneration, lumbar region: Secondary | ICD-10-CM

## 2021-07-05 DIAGNOSIS — E2740 Unspecified adrenocortical insufficiency: Secondary | ICD-10-CM

## 2021-07-05 DIAGNOSIS — Z8719 Personal history of other diseases of the digestive system: Secondary | ICD-10-CM

## 2021-07-05 DIAGNOSIS — L4050 Arthropathic psoriasis, unspecified: Secondary | ICD-10-CM | POA: Diagnosis not present

## 2021-07-05 DIAGNOSIS — M5134 Other intervertebral disc degeneration, thoracic region: Secondary | ICD-10-CM

## 2021-07-05 DIAGNOSIS — D692 Other nonthrombocytopenic purpura: Secondary | ICD-10-CM

## 2021-07-05 DIAGNOSIS — K52831 Collagenous colitis: Secondary | ICD-10-CM

## 2021-07-05 DIAGNOSIS — I7300 Raynaud's syndrome without gangrene: Secondary | ICD-10-CM

## 2021-07-05 DIAGNOSIS — R600 Localized edema: Secondary | ICD-10-CM

## 2021-07-05 DIAGNOSIS — Z8639 Personal history of other endocrine, nutritional and metabolic disease: Secondary | ICD-10-CM

## 2021-07-05 DIAGNOSIS — G894 Chronic pain syndrome: Secondary | ICD-10-CM

## 2021-07-05 MED ORDER — TRIAMCINOLONE ACETONIDE 40 MG/ML IJ SUSP
10.0000 mg | INTRAMUSCULAR | Status: AC | PRN
  Administered 2021-07-05: 10 mg via INTRA_ARTICULAR

## 2021-07-05 MED ORDER — LIDOCAINE HCL 1 % IJ SOLN
0.5000 mL | INTRAMUSCULAR | Status: AC | PRN
  Administered 2021-07-05: .5 mL

## 2021-07-14 ENCOUNTER — Other Ambulatory Visit: Payer: Self-pay | Admitting: Physician Assistant

## 2021-07-17 NOTE — Telephone Encounter (Signed)
Next Visit: 09/20/2021 ? ?Last Visit: 07/05/2021 ? ?Last Fill: 03/28/2021 ? ?DX: Psoriatic arthritis  ? ?Current Dose per office note 07/05/2021: prednisone 5 mg p.o. daily ? ?Okay to refill Prednisone?  ?

## 2021-07-18 DIAGNOSIS — Z6827 Body mass index (BMI) 27.0-27.9, adult: Secondary | ICD-10-CM | POA: Diagnosis not present

## 2021-07-18 DIAGNOSIS — M5416 Radiculopathy, lumbar region: Secondary | ICD-10-CM | POA: Diagnosis not present

## 2021-07-18 DIAGNOSIS — M48061 Spinal stenosis, lumbar region without neurogenic claudication: Secondary | ICD-10-CM | POA: Diagnosis not present

## 2021-07-19 ENCOUNTER — Ambulatory Visit: Payer: PPO

## 2021-07-19 ENCOUNTER — Ambulatory Visit: Payer: PPO | Admitting: Podiatry

## 2021-07-19 DIAGNOSIS — E119 Type 2 diabetes mellitus without complications: Secondary | ICD-10-CM

## 2021-07-19 DIAGNOSIS — Q828 Other specified congenital malformations of skin: Secondary | ICD-10-CM | POA: Diagnosis not present

## 2021-07-19 DIAGNOSIS — M2041 Other hammer toe(s) (acquired), right foot: Secondary | ICD-10-CM

## 2021-07-19 DIAGNOSIS — M2012 Hallux valgus (acquired), left foot: Secondary | ICD-10-CM

## 2021-07-19 NOTE — Progress Notes (Signed)
SITUATION ?Reason for Consult: Evaluation for Prefabricated Diabetic Shoes and Custom Diabetic Inserts. ?Patient / Caregiver Report: Patient would like well fitting shoes ? ?OBJECTIVE DATA: ?Patient History / Diagnosis:  ?  ICD-10-CM   ?1. Diabetes mellitus without complication (HCC)  T41.9   ?  ?2. Hallux valgus, acquired, bilateral  M20.11   ? M20.12   ?  ?3. Acquired hammertoes of both feet  M20.41   ? M20.42   ?  ? ? ?Physician Treating Diabetes:  Dr. Chalmers Cater ? ?Current or Previous Devices:   None and no history ? ?In-Person Foot Examination: ?Ulcers & Callousing:   None ?Deformities:    Hammertoes, bunions ?Sensation:    Intact  ?Shoe Size:     7W ? ?ORTHOTIC RECOMMENDATION ?Recommended Devices: ?- 1x pair prefabricated PDAC approved diabetic shoes; Patient Selected Arlyce Harman Black 829 Size 7W ?- 3x pair custom-to-patient PDAC approved vacuum formed diabetic insoles. ? ?GOALS OF SHOES AND INSOLES ?- Reduce shear and pressure ?- Reduce / Prevent callus formation ?- Reduce / Prevent ulceration ?- Protect the fragile healing compromised diabetic foot. ? ?Patient would benefit from diabetic shoes and inserts as patient has diabetes mellitus and the patient has one or more of the following conditions: ?- History of partial or complete amputation of the foot ?- History of previous foot ulceration. ?- History of pre-ulcerative callus ?- Peripheral neuropathy with evidence of callus formation ?- Foot deformity ?- Poor circulation ? ?ACTIONS PERFORMED ?Potential out of pocket cost was communicated to patient. Patient understood and consented to measurement and casting. Patient was casted for insoles via crush box and measured for shoes via brannock device. Procedure was explained and patient tolerated procedure well. All questions were answered and concerns addressed. Casts were shipped to central fabrication for HOLD until Certificate of Medical Necessity or otherwise necessary authorization from insurance is  obtained. ? ?PLAN ?Shoes are to be ordered and casts released from hold once all appropriate paperwork is complete. Patient is to be contacted and scheduled for fitting once shoes and insoles have been fabricated and received. ? ?

## 2021-07-28 DIAGNOSIS — J300 Vasomotor rhinitis: Secondary | ICD-10-CM | POA: Diagnosis not present

## 2021-07-28 DIAGNOSIS — R052 Subacute cough: Secondary | ICD-10-CM | POA: Diagnosis not present

## 2021-07-28 DIAGNOSIS — H1045 Other chronic allergic conjunctivitis: Secondary | ICD-10-CM | POA: Diagnosis not present

## 2021-07-29 DIAGNOSIS — J300 Vasomotor rhinitis: Secondary | ICD-10-CM | POA: Insufficient documentation

## 2021-07-29 DIAGNOSIS — H1045 Other chronic allergic conjunctivitis: Secondary | ICD-10-CM | POA: Insufficient documentation

## 2021-07-29 NOTE — Progress Notes (Signed)
?  Subjective:  ?Patient ID: Tonya Hall, female    DOB: November 04, 1950,  MRN: [id number] ? ?Tonya Hall presents to clinic today for preventative diabetic foot care and painful porokeratotic lesions right foot. Pain prevent(s) comfortable ambulation. Aggravating factor is weightbearing with and without shoegear. ? ?Last known HgA1c was 6.2%. Patient does not monitor blood glucose daily. ? ?New problem(s): None.  ? ?PCP is [name], MD. ? ?Allergies  ?Allergen Reactions  ? Adalimumab Rash and Other (See Comments)  ?  FEVER, Fast pulse ?Other reaction(s): Unknown  ? Imuran [Azathioprine Sodium] Rash  ?  Denies Airway involvement  ? Methotrexate Other (See Comments)  ?  Liver Enzyme elevation - after 1 month of use ?Other reaction(s): Unknown  ? Plaquenil [Hydroxychloroquine Sulfate] Rash  ?  Denies airway involvement.   ? Sulfonamide Derivatives Rash  ?  Occurred with Sulfasalazine. Rash only.   ? Azathioprine   ?  Other reaction(s): Unknown  ? Ceftin [Cefuroxime Axetil] Nausea Only  ?  Upset stomach  ? Cefuroxime   ?  Other reaction(s): Unknown  ? Morphine Sulfate   ?  Other reaction(s): Unknown  ? Morphine Nausea And Vomiting  ?  IV morphine caused N and V  After surgery.   Has tolerated [place] in the past   ? ? ?Review of Systems: Negative except as noted in the HPI. ? ?Objective: No changes noted in today's physical examination. ? ?Tonya Hall is a pleasant 71 y.o. female in NAD. AAO X 3. ? ?Vascular Examination: ?Neurovascular status unchanged bilaterally. Capillary refill time to digits immediate b/l. Palpable pedal pulses b/l LE. Pedal hair absent. No pain with calf compression b/l. Lower extremity skin temperature gradient warm to cool. Red to purple discoloration consistent with Raynaud's. No ischemia or gangrene. No clubbing b/l.. ? ?Dermatological Examination: ?No open wounds b/l LE. No interdigital macerations noted b/l LE. Toenails 1-5 well maintained with adequate length. Porokeratotic  lesion(s) submet head 4 right foot. No erythema, no edema, no drainage, no fluctuance. ? ?Musculoskeletal Examination: ?Muscle strength 5/5 to all lower extremity muscle groups bilaterally. Hallux valgus with bunion deformity noted right foot. Hammertoe deformity noted 2-5 b/l. ? ?Neurological Examination: ?Protective sensation intact 5/5 intact bilaterally with 10g monofilament b/l. Vibratory sensation intact b/l. Proprioception intact bilaterally. ? ? ?  Latest Ref Rng & Units 08/01/2020  ? 11:45 AM  ?Hemoglobin A1C  ?Hemoglobin-A1c 4.8 - 5.6 % 6.6    ? ?Assessment/Plan: ?1. Porokeratosis   ?2. Diabetes mellitus without complication ([place])   ?  ? ?-Patient was evaluated and treated. All patient's and/or POA's questions/concerns answered on today's visit. ?-Patient to continue soft, supportive shoe gear daily. ?-Painful porokeratotic lesion(s) submet head 4 right foot pared and enucleated with sterile scalpel blade without incident. Total number of lesions debrided=1. ?-Patient/POA to call should there be question/concern in the interim.  ? ?Return in about 9 weeks (around 09/20/2021). ? ?Marzetta Board, DPM  ?

## 2021-08-10 DIAGNOSIS — E785 Hyperlipidemia, unspecified: Secondary | ICD-10-CM | POA: Diagnosis not present

## 2021-08-12 LAB — NMR, LIPOPROFILE
Cholesterol, Total: 329 mg/dL — ABNORMAL HIGH (ref 100–199)
HDL Particle Number: 31.6 umol/L (ref >=30.5)
HDL-C: 46 mg/dL (ref >39)
LDL Particle Number: 3075 nmol/L — ABNORMAL HIGH (ref <1000)
LDL Size: 20.4 nm — ABNORMAL LOW (ref >20.5)
LDL-C (NIH Calc): 210 mg/dL — ABNORMAL HIGH (ref 0–99)
LP-IR Score: 74 — ABNORMAL HIGH (ref <=45)
Small LDL Particle Number: 2034 nmol/L — ABNORMAL HIGH (ref <=527)
Triglycerides: 354 mg/dL — ABNORMAL HIGH (ref 0–149)

## 2021-08-12 LAB — LIPOPROTEIN A (LPA): Lipoprotein (a): 70.9 nmol/L (ref <75.0)

## 2021-08-14 ENCOUNTER — Other Ambulatory Visit: Payer: Self-pay | Admitting: Physician Assistant

## 2021-08-14 DIAGNOSIS — L409 Psoriasis, unspecified: Secondary | ICD-10-CM

## 2021-08-14 DIAGNOSIS — Z79899 Other long term (current) drug therapy: Secondary | ICD-10-CM

## 2021-08-14 DIAGNOSIS — L4050 Arthropathic psoriasis, unspecified: Secondary | ICD-10-CM

## 2021-08-14 NOTE — Telephone Encounter (Signed)
Next Visit: 09/20/2021 ?  ?Last Visit: 07/05/2021 ?  ?Last Fill: 03/20/2021 ? ?DX: Psoriatic arthritis  ?  ?Current Dose per office note 07/05/2021: Taltz 80 mg sq injections q 4 weeks ? ?Labs: 06/19/2021 Labs are stable.  No changes at this time. ? ?TB Gold: 10/19/2020 Neg ? ?Okay to refill Donnetta Hail?  ?

## 2021-08-16 ENCOUNTER — Other Ambulatory Visit: Payer: Self-pay | Admitting: Gastroenterology

## 2021-08-16 ENCOUNTER — Other Ambulatory Visit: Payer: Self-pay | Admitting: Rheumatology

## 2021-08-16 DIAGNOSIS — L409 Psoriasis, unspecified: Secondary | ICD-10-CM

## 2021-08-16 MED ORDER — OTEZLA 30 MG PO TABS: 30.0000 mg | ORAL_TABLET | Freq: Two times a day (BID) | ORAL | 0 refills | Status: DC

## 2021-08-16 NOTE — Telephone Encounter (Signed)
Next Visit: 09/20/2021 ?  ?Last Visit: 07/05/2021 ?  ?Last Fill: 05/18/2021 ?  ?DX: Psoriatic arthritis  ?  ?Current Dose per office note 07/05/2021: Otezla 30 mg p.o. twice daily.   ? ?Labs: 06/19/2021 Labs are stable.  No changes at this time.  ? ?Okay to refill [name]?  ?

## 2021-08-16 NOTE — Telephone Encounter (Signed)
Patient called the office requesting a refill of Otzela '30mg'$  be sent to [facility]. Patient states in the past we sent in a refill to last the whole year and she would like that sent in again.  ?

## 2021-08-24 ENCOUNTER — Other Ambulatory Visit: Payer: Self-pay | Admitting: Physician Assistant

## 2021-08-25 ENCOUNTER — Encounter: Payer: Self-pay | Admitting: Internal Medicine

## 2021-08-25 ENCOUNTER — Ambulatory Visit: Payer: PPO | Admitting: Internal Medicine

## 2021-08-25 VITALS — BP 138/66 | HR 84 | Ht 59.0 in | Wt 135.0 lb

## 2021-08-25 DIAGNOSIS — E7849 Other hyperlipidemia: Secondary | ICD-10-CM | POA: Diagnosis not present

## 2021-08-25 DIAGNOSIS — T466X5A Adverse effect of antihyperlipidemic and antiarteriosclerotic drugs, initial encounter: Secondary | ICD-10-CM | POA: Diagnosis not present

## 2021-08-25 DIAGNOSIS — R931 Abnormal findings on diagnostic imaging of heart and coronary circulation: Secondary | ICD-10-CM

## 2021-08-25 DIAGNOSIS — M791 Myalgia, unspecified site: Secondary | ICD-10-CM | POA: Diagnosis not present

## 2021-08-25 DIAGNOSIS — E119 Type 2 diabetes mellitus without complications: Secondary | ICD-10-CM

## 2021-08-25 DIAGNOSIS — I700 Atherosclerosis of aorta: Secondary | ICD-10-CM

## 2021-08-25 NOTE — Progress Notes (Signed)
OFFICE CONSULT NOTE  Chief Complaint:  Follow-up  Primary Care Physician: [name], MD  HPI:  Tonya Hall is a 71 y.o. female who is being seen today for the evaluation of pedal and periorbital edema at the request of [name], MD. 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  She has had some difficulty with her vision noting swelling on the upper and lower lids.  She has been seen by 2 eye surgeons the, second of which felt that she could undergo surgery however it was too costly for her and would have required a brow lift.  She also has noted some lower extremity edema which is dependent.  There is also dependent discoloration which sounds like venous congestion.  She notes to have lower extremity varicosities.  She also has venous lakes in her feet as well.  She denies any shortness of breath or chest pain, no orthopnea, PND or other heart failure symptoms.  08/25/2021  Ms. Rhames returns today for follow-up.  She had a remarkably abnormal coronary calcium score which was elevated at 140, noting all of her calcium in the LAD artery.  This was 75th percentile for age and sex matched controls.  In addition she has aortic atherosclerosis.  She reports longstanding history of high cholesterol including multiple family members with high cholesterol however most had late onset heart disease including several relatives that had heart disease but died in their 53s.  Her lipid profile was remarkably abnormal as well.  Total cholesterol 329, triglycerides 354, HDL 46 and LDL 210.  LDL particle #3075 with a small LDL particle #2034.  LP(a) was 70.9 and considered negative although top normal.  In discussion with her today, she has previously tried both  atorvastatin and rosuvastatin which caused worsening muscle aches and joint pain.  PMHx:  Past Medical History:  Diagnosis Date   Asthma    Albuterol in haler prn   Cataract    immature unsure which eye   Chronic back pain    COVID-19 October/November 2020   Degenerative disk disease    psoriatic   Diabetes mellitus without complication (HCC)    diet controlled   Esophageal motility disorder    Non-specific, see modified barium study/speech path, BP   Fibromyalgia    GERD (gastroesophageal reflux disease)    takes Protonix bid   Headache 2019   History of blood transfusion    post c-section   History of bronchitis    HTN (hypertension)    Hyperlipidemia    takes Pravastatin daily   Lymphocytic colitis 05/26/2010   Responded to Entocort x 3 MOS   Neuropathy    Nonallergic rhinitis    Osteoporosis    gets Boniva every 3 months   Peripheral edema    takes HCTZ daily   Pneumonia 02/2019   Psoriatic arthritis ([place])    Dr. Katherina Right   Psoriatic arthritis [facility])    bilateral hands   Raynaud's disease    Seasonal allergies    Shingles 2020   SUI (stress urinary incontinence, female)    Urinary urgency     Past Surgical History:  Procedure Laterality Date   ANTERIOR CERVICAL DECOMP/DISCECTOMY FUSION N/A 03/09/2020   Procedure: ANTERIOR CERVICAL DECOMPRESSION/DISCECTOMY FUSION, INTERBODY PROSTHESIS, PLATE SCREWS CERVICAL FIVE-SIX, CERVICAL SIX-SEVEN;  Surgeon: Newman Pies, MD;  Location: [place];  Service: Neurosurgery;  Laterality: N/A;   BACK SURGERY  2003   BIOPSY  04/17/2016   Procedure: BIOPSY;  Surgeon: Danie Binder, MD;  [facility];  Service: Endoscopy;;  random colon bx's   CERVICAL SPINE SURGERY  09/09/2017   CESAREAN SECTION  1974, 1978       COLONOSCOPY  06/19/2008   TFT:DDUKG internal hemorrhoids/mild sigmoin colon diverticulosis   COLONOSCOPY  2012   Dr. Gala Romney: normal rectum, diverticula, lymphocytic colitis    COLONOSCOPY WITH PROPOFOL  N/A 04/17/2016   Dr. Oneida Alar: Normal terminal ileum, internal/external hemorrhoids, random colon biopsies consistent with collagenous colitis   DILATION AND CURETTAGE OF UTERUS  1977   spontaneous abortion   ESOPHAGOGASTRODUODENOSCOPY  06/19/2008   URK:YHCWCBJS gastritis   LUMBAR DISC ARTHROPLASTY  7/98   LUMBAR FUSION  6/03, 11/08, 11/14   L4-5 fusion, L2-3 fusion, L1-2   LUMBAR FUSION  08/03/2020   L4-L5   LUMBAR LAMINECTOMY  03/2020   ODONTOID SCREW INSERTION N/A 01/27/2018   Procedure: ODONTOID SCREW INSERTION;  Surgeon: Newman Pies, MD;  Location: [place];  Service: Neurosurgery;  Laterality: N/A;  ODONTOID SCREW INSERTION   TONSILLECTOMY     TONSILLECTOMY AND ADENOIDECTOMY     TUBAL LIGATION      FAMHx:  Family History  Problem Relation Age of Onset   Diabetes Mother    Pancreatitis Mother    Arthritis Mother        psoriatic    Fibromyalgia Mother    Rheumatic fever Father    Diabetes Other        grandparents   Skin cancer Other        grandfather   Hypertension Other        grandparent   Congestive Heart Failure Other        grandfather   Parkinson's disease Other        grandmother   Colon cancer Maternal Uncle    Fibromyalgia Sister    Rheum arthritis Sister    Diabetes Sister    Fibromyalgia Sister    Diabetes Sister    Rheum arthritis Sister    Hypertension Son    Colon polyps Neg Hx     SOCHx:   reports that she has never smoked. She has never been exposed to tobacco smoke. She has never used smokeless tobacco. She reports that she does not drink alcohol and does not use drugs.  ALLERGIES:  Allergies  Allergen Reactions   Adalimumab Rash and Other (See Comments)    FEVER, Fast pulse Other reaction(s): Unknown   Imuran [Azathioprine Sodium] Rash    Denies Airway involvement   Methotrexate Other (See Comments)    Liver Enzyme elevation - after 1 month of use Other reaction(s): Unknown   Plaquenil [Hydroxychloroquine Sulfate] Rash    Denies  airway involvement.    Sulfonamide Derivatives Rash    Occurred with Sulfasalazine. Rash only.    Azathioprine     Other reaction(s): Unknown   Ceftin [Cefuroxime Axetil] Nausea Only    Upset stomach   Cefuroxime     Other reaction(s): Unknown   Morphine Sulfate     Other reaction(s): Unknown   Morphine Nausea And Vomiting    IV morphine caused N and V  After surgery.   Has tolerated Kadian in the past     ROS: Pertinent items noted in HPI and remainder of comprehensive ROS otherwise negative.  HOME  MEDS: Current Outpatient Medications on File Prior to Visit  Medication Sig Dispense Refill   albuterol (PROVENTIL HFA;VENTOLIN HFA) 108 (90 BASE) MCG/ACT inhaler Inhale 2 puffs into the lungs every 6 (six) hours as needed for wheezing or shortness of breath.      Apremilast (OTEZLA) 30 MG TABS Take 1 tablet (30 mg total) by mouth 2 (two) times daily. 180 tablet 0   ascorbic acid (VITAMIN C) 500 MG tablet Take 500 mg by mouth 2 (two) times daily.     aspirin 81 MG tablet Take 81 mg by mouth daily.     azelastine (ASTELIN) 0.1 % nasal spray Place 1 spray into both nostrils 2 (two) times daily.     Calcium Carb-Cholecalciferol (CALCIUM 600 + D PO) Take 1 tablet by mouth 2 (two) times daily.      cetirizine (ZYRTEC) 10 MG tablet as needed.     denosumab (PROLIA) 60 MG/ML SOSY injection Inject 60 mg into the skin every 6 (six) months.     fish oil-omega-3 fatty acids 1000 MG capsule Take 1 g by mouth daily.     gabapentin (NEURONTIN) 300 MG capsule Take 600 mg by mouth See admin instructions. Take 600 mg 3 times daily, may take a 4th 300 mg dose as needed for pain     gabapentin (NEURONTIN) 600 MG tablet Take 600 mg by mouth 3 (three) times daily.     Ginger, Zingiber officinalis, (GINGER ROOT) 500 MG CAPS Take 500 mg by mouth daily.     Glucosamine Sulfate 1000 MG CAPS Take 2 capsules (2,000 mg total) by mouth daily.     HUMULIN N KWIKPEN 100 UNIT/ML Kiwkpen Inject 10 Units into the skin at  bedtime.     hydrocortisone sodium succinate (SOLU-CORTEF) 100 MG SOLR injection Inject 100 mg into the muscle daily as needed (when unable to keep down prednisone tablet).      hydrOXYzine (ATARAX) 25 MG tablet Take 0.5-1 tablets (12.5-25 mg total) by mouth every 8 (eight) hours as needed for itching. 30 tablet 0   Lancets (ONETOUCH DELICA PLUS NIOEVO35K) MISC USE TO TEST 3 TIMESUDAILY.     leflunomide (ARAVA) 20 MG tablet TAKE 1 TABLET BY MOUTH ONCE DAILY. 90 tablet 0   losartan (COZAAR) 100 MG tablet Take 100 mg by mouth daily.     metoprolol succinate (TOPROL-XL) 50 MG 24 hr tablet Take 1 tablet (50 mg total) by mouth daily. Take with or immediately following a meal.     Misc Natural Products (TART CHERRY ADVANCED PO) Take 1 tablet by mouth daily. daily     Multiple Vitamin (MULTIVITAMIN) tablet Take 1 tablet by mouth daily.     nortriptyline (PAMELOR) 25 MG capsule TAKE (1) CAPSULE BY MOUTH AT BEDTIME. (Patient taking differently: Take 25 mg by mouth at bedtime.) 30 capsule 0   omeprazole (PRILOSEC) 20 MG capsule TAKE ONE CAPSULE BY MOUTH TWICE DAILY BEFORE MEALS. 180 capsule 3   ONETOUCH VERIO test strip      oxybutynin (DITROPAN-XL) 5 MG 24 hr tablet Take 1 tablet (5 mg total) by mouth at bedtime. 90 tablet 0   Polyethyl Glycol-Propyl Glycol (SYSTANE OP) Apply to eye 4 (four) times daily.     potassium chloride SA (K-DUR,KLOR-CON) 20 MEQ tablet Take 20 mEq by mouth daily.      predniSONE (DELTASONE) 5 MG tablet TAKE 1 TABLET BY MOUTH DAILY WITH BREAKFAST 90 tablet 0   RUTIN PO Take 350 mg by mouth 3 (three)  times daily.     spironolactone (ALDACTONE) 25 MG tablet Take 25 mg by mouth daily.     SURE COMFORT PEN NEEDLES 31G X 5 MM MISC      TALTZ 80 MG/ML SOAJ INJECT '80MG'$  (1 PEN) UNDER THE SKIN EVERY 4 WEEKS 4 mL 0   topiramate (TOPAMAX) 200 MG tablet TAKE (1) TABLET BY MOUTH AT BEDTIME. 90 tablet 0   Turmeric 500 MG CAPS Take 500 mg by mouth every morning.     [DISCONTINUED] Glucosamine  500 MG TABS Take 4 tablets by mouth daily.       [DISCONTINUED] simvastatin (ZOCOR) 40 MG tablet Take 20 mg by mouth at bedtime.      No current facility-administered medications on file prior to visit.    LABS/IMAGING: No results found for this or any previous visit (from the past 48 hour(s)). No results found.  LIPID PANEL:    Component Value Date/Time   TRIG 289 (H) 02/06/2019 1712    WEIGHTS: Wt Readings from Last 3 Encounters:  08/25/21 135 lb (61.2 kg)  07/05/21 135 lb 9.6 oz (61.5 kg)  06/19/21 135 lb (61.2 kg)    VITALS: BP 138/66   Pulse 84   Ht '4\' 11"'$  (1.499 m)   Wt 135 lb (61.2 kg)   LMP 02/01/1999   SpO2 97%   BMI 27.27 kg/m   EXAM: Deferred  EKG: Deferred  ASSESSMENT: Periorbital and pedal edema -chronic Elevated CAC score of 140, 75th percentile - aortic atherosclerosis (05/2021) Hypertension Dyslipidemia, LDL >190 -probable familial hyperlipidemia (Simon Broome criteria) Psoriatic arthritis DM2  PLAN: 1.   Ms. Maul appears to have a probable familial hyperlipidemia.  She has multiple family members with high cholesterol and LDL greater than 190 with very high particle numbers.  Her LP(a) is considered negative although top normal at 70.9.  Unfortunately, she could not tolerate atorvastatin or rosuvastatin in the past.  She is a good candidate for PCSK9 inhibitor.  We discussed this at length today and she is familiar with injections as well as antibody therapies given her psoriatic arthritis.  We will reach out for prior authorization and plan to repeat lipid NMR in about 3 to 4 months and follow-up with me at that time.  Pixie Casino, MD, [facility], [place] Director of the [facility] Lipid Disorders &  Cardiovascular Risk Reduction [facility] Diplomate of the American Board of Clinical Lipidology Attending Cardiologist  Direct Dial: [phone]  Fax: [phone]  Website:  [email or web address]  Nickia Boesen 08/25/2021, 1:04 PM

## 2021-08-25 NOTE — Patient Instructions (Signed)
Medication Instructions:  Your physician recommends that you continue on your current medications as directed. Please refer to the Current Medication list given to you today.   Labwork: NMR just before next visit  Testing/Procedures: none  Follow-Up: 3-4 months  Any Other Special Instructions Will Be Listed Below (If Applicable).  If you need a refill on your cardiac medications before your next appointment, please call your pharmacy.

## 2021-08-25 NOTE — Telephone Encounter (Signed)
Next Visit: 09/20/2021  Last Visit: 07/05/2021  Last Fill: 05/22/2021 Arava, Topamax 05/31/2021  DX: psoriatic arthritis  Current Dose per office note 07/05/2021: Tonya Hall '20mg'$  1 tablet daily  Labs: 06/19/2021, WBC count remains elevated-13.1.  absolute neutrophils and absolute eosinophils remain slightly elevated.  Glucose is elevated-141.  BUN is elevated-27.  Rest of CMP WNL.   Okay to refill Arava and Topamax?

## 2021-08-30 ENCOUNTER — Telehealth: Payer: Self-pay | Admitting: Internal Medicine

## 2021-08-30 MED ORDER — REPATHA SURECLICK 140 MG/ML ~~LOC~~ SOAJ: 1.0000 | SUBCUTANEOUS | 3 refills | Status: DC

## 2021-08-30 NOTE — Addendum Note (Signed)
Addended by: Fidel Levy on: 08/30/2021 04:24 PM   Modules accepted: Orders

## 2021-08-30 NOTE — Telephone Encounter (Signed)
Medication approved 31-MAY-23 -- 30-NOV-23

## 2021-08-30 NOTE — Telephone Encounter (Signed)
PA for repatha sureclick submitted via CMM (Key: [id number])

## 2021-08-31 ENCOUNTER — Other Ambulatory Visit (HOSPITAL_COMMUNITY): Payer: Self-pay | Admitting: Internal Medicine

## 2021-08-31 ENCOUNTER — Ambulatory Visit (HOSPITAL_COMMUNITY)
Admission: RE | Admit: 2021-08-31 | Discharge: 2021-08-31 | Disposition: A | Discharge status: Home / Self Care | Payer: PPO | Source: Ambulatory Visit | Attending: Internal Medicine | Admitting: Internal Medicine

## 2021-08-31 DIAGNOSIS — M549 Dorsalgia, unspecified: Secondary | ICD-10-CM | POA: Diagnosis not present

## 2021-08-31 DIAGNOSIS — R079 Chest pain, unspecified: Secondary | ICD-10-CM | POA: Diagnosis not present

## 2021-08-31 DIAGNOSIS — L304 Erythema intertrigo: Secondary | ICD-10-CM | POA: Diagnosis not present

## 2021-08-31 DIAGNOSIS — L308 Other specified dermatitis: Secondary | ICD-10-CM | POA: Diagnosis not present

## 2021-08-31 DIAGNOSIS — L309 Dermatitis, unspecified: Secondary | ICD-10-CM | POA: Diagnosis not present

## 2021-09-08 DIAGNOSIS — E119 Type 2 diabetes mellitus without complications: Secondary | ICD-10-CM | POA: Diagnosis not present

## 2021-09-08 DIAGNOSIS — E559 Vitamin D deficiency, unspecified: Secondary | ICD-10-CM | POA: Diagnosis not present

## 2021-09-08 DIAGNOSIS — M810 Age-related osteoporosis without current pathological fracture: Secondary | ICD-10-CM | POA: Diagnosis not present

## 2021-09-08 DIAGNOSIS — E7800 Pure hypercholesterolemia, unspecified: Secondary | ICD-10-CM | POA: Diagnosis not present

## 2021-09-12 ENCOUNTER — Other Ambulatory Visit (HOSPITAL_COMMUNITY): Payer: Self-pay | Admitting: Student

## 2021-09-12 ENCOUNTER — Other Ambulatory Visit: Payer: Self-pay | Admitting: Student

## 2021-09-12 DIAGNOSIS — M546 Pain in thoracic spine: Secondary | ICD-10-CM

## 2021-09-13 DIAGNOSIS — M810 Age-related osteoporosis without current pathological fracture: Secondary | ICD-10-CM | POA: Diagnosis not present

## 2021-09-19 ENCOUNTER — Ambulatory Visit
Admission: RE | Admit: 2021-09-19 | Discharge: 2021-09-19 | Disposition: A | Discharge status: Home / Self Care | Payer: PPO | Source: Ambulatory Visit | Attending: Student | Admitting: Student

## 2021-09-19 DIAGNOSIS — M546 Pain in thoracic spine: Secondary | ICD-10-CM | POA: Diagnosis not present

## 2021-09-20 ENCOUNTER — Encounter: Payer: Self-pay | Admitting: Rheumatology

## 2021-09-20 ENCOUNTER — Ambulatory Visit: Payer: PPO | Admitting: Rheumatology

## 2021-09-20 VITALS — BP 160/71 | HR 98 | Resp 16 | Ht 59.0 in | Wt 135.8 lb

## 2021-09-20 DIAGNOSIS — M533 Sacrococcygeal disorders, not elsewhere classified: Secondary | ICD-10-CM | POA: Diagnosis not present

## 2021-09-20 DIAGNOSIS — Z79899 Other long term (current) drug therapy: Secondary | ICD-10-CM

## 2021-09-20 DIAGNOSIS — K52831 Collagenous colitis: Secondary | ICD-10-CM

## 2021-09-20 DIAGNOSIS — M810 Age-related osteoporosis without current pathological fracture: Secondary | ICD-10-CM | POA: Diagnosis not present

## 2021-09-20 DIAGNOSIS — M7062 Trochanteric bursitis, left hip: Secondary | ICD-10-CM

## 2021-09-20 DIAGNOSIS — M51369 Other intervertebral disc degeneration, lumbar region without mention of lumbar back pain or lower extremity pain: Secondary | ICD-10-CM

## 2021-09-20 DIAGNOSIS — G8929 Other chronic pain: Secondary | ICD-10-CM

## 2021-09-20 DIAGNOSIS — G894 Chronic pain syndrome: Secondary | ICD-10-CM

## 2021-09-20 DIAGNOSIS — K224 Dyskinesia of esophagus: Secondary | ICD-10-CM

## 2021-09-20 DIAGNOSIS — M5136 Other intervertebral disc degeneration, lumbar region: Secondary | ICD-10-CM | POA: Diagnosis not present

## 2021-09-20 DIAGNOSIS — L409 Psoriasis, unspecified: Secondary | ICD-10-CM | POA: Diagnosis not present

## 2021-09-20 DIAGNOSIS — Z8639 Personal history of other endocrine, nutritional and metabolic disease: Secondary | ICD-10-CM

## 2021-09-20 DIAGNOSIS — Z8719 Personal history of other diseases of the digestive system: Secondary | ICD-10-CM

## 2021-09-20 DIAGNOSIS — L4050 Arthropathic psoriasis, unspecified: Secondary | ICD-10-CM | POA: Diagnosis not present

## 2021-09-20 DIAGNOSIS — M7061 Trochanteric bursitis, right hip: Secondary | ICD-10-CM | POA: Diagnosis not present

## 2021-09-20 DIAGNOSIS — M5134 Other intervertebral disc degeneration, thoracic region: Secondary | ICD-10-CM

## 2021-09-20 DIAGNOSIS — M170 Bilateral primary osteoarthritis of knee: Secondary | ICD-10-CM | POA: Diagnosis not present

## 2021-09-20 DIAGNOSIS — M79646 Pain in unspecified finger(s): Secondary | ICD-10-CM | POA: Diagnosis not present

## 2021-09-20 DIAGNOSIS — E2740 Unspecified adrenocortical insufficiency: Secondary | ICD-10-CM

## 2021-09-20 DIAGNOSIS — M5030 Other cervical disc degeneration, unspecified cervical region: Secondary | ICD-10-CM | POA: Diagnosis not present

## 2021-09-20 DIAGNOSIS — M1711 Unilateral primary osteoarthritis, right knee: Secondary | ICD-10-CM

## 2021-09-20 DIAGNOSIS — I7300 Raynaud's syndrome without gangrene: Secondary | ICD-10-CM

## 2021-09-20 DIAGNOSIS — I10 Essential (primary) hypertension: Secondary | ICD-10-CM

## 2021-09-20 NOTE — Patient Instructions (Signed)
Standing Labs We placed an order today for your standing lab work.   Please have your standing labs drawn in September and every 3 months  If possible, please have your labs drawn 2 weeks prior to your appointment so that the provider can discuss your results at your appointment.  Please note that you may see your imaging and lab results in MyChart before we have reviewed them. We may be awaiting multiple results to interpret others before contacting you. Please allow our office up to 72 hours to thoroughly review all of the results before contacting the office for clarification of your results.  We have open lab daily: Monday through Thursday from 1:30-4:30 PM and Friday from 1:30-4:00 PM at the office of Dr. Honora Searson, [facility].   Please be advised, all patients with office appointments requiring lab work will take precedent over walk-in lab work.  If possible, please come for your lab work on Monday and Friday afternoons, as you may experience shorter wait times. The office is located at [address], [address], [place], [place] [address] No appointment is necessary.   Labs are drawn by [facility]. Please bring your co-pay at the time of your lab draw.  You may receive a bill from [facility] for your lab work.  Please note if you are on Hydroxychloroquine and and an order has been placed for a Hydroxychloroquine level, you will need to have it drawn 4 hours or more after your last dose.  If you wish to have your labs drawn at another location, please call the office 24 hours in advance to send orders.  If you have any questions regarding directions or hours of operation,  please call [phone].   As a reminder, please drink plenty of water prior to coming for your lab work. Thanks!   Vaccines You are taking a medication(s) that can suppress your immune system.  The following immunizations are recommended: Flu annually Covid-19  Td/Tdap (tetanus, diphtheria,  pertussis) every 10 years Pneumonia (Prevnar 15 then Pneumovax 23 at least 1 year apart.  Alternatively, can take Prevnar 20 without needing additional dose) Shingrix: 2 doses from 4 weeks to 6 months apart  Please check with your PCP to make sure you are up to date.   If you have signs or symptoms of an infection or start antibiotics: First, call your PCP for workup of your infection. Hold your medication through the infection, until you complete your antibiotics, and until symptoms resolve if you take the following: Injectable medication (Actemra, Benlysta, Cimzia, Cosentyx, Enbrel, Humira, Kevzara, Orencia, Remicade, Simponi, Stelara, Taltz, Tremfya) Methotrexate Leflunomide (Arava) Mycophenolate (Cellcept) Xeljanz, Olumiant, or Rinvoq  

## 2021-09-20 NOTE — Progress Notes (Signed)
Office Visit Note  Patient: Tonya Hall             Date of Birth: 1950-04-12           MRN: [id number]             PCP: [name], MD Referring: [name], MD Visit Date: 09/20/2021 Occupation: '@GUAROCC'$ @  Subjective:  Medication management  History of Present Illness: Tonya Hall is a 71 y.o. female with history of psoriatic arthritis, osteoarthritis and degenerative disc disease.  She states she continues to have severe pain and discomfort in her bilateral hands.  She has difficulty holding objects and driving.  She had left thumb injection in April which lasted for short time.  Her bilateral thumbs are hurting currently.  She also has pain in her left knee and left ankle.  She states her left ankle has been swollen.  She also has been having severe midthoracic pain for which she was evaluated by Dr. Arnoldo Morale.  She had MRI of her thoracic spine but the results are pending at this time.  She continues to have discomfort in her entire spine.  For osteoporosis she is on Prolia injections through Dr. Chalmers Cater.  Her next injection is due this week.  Activities of Daily Living:  Patient reports morning stiffness for 24 hours.   Patient Reports nocturnal pain.  Difficulty dressing/grooming: Reports Difficulty climbing stairs: Reports Difficulty getting out of chair: Reports Difficulty using hands for taps, buttons, cutlery, and/or writing: Reports  Review of Systems  Constitutional:  Positive for fatigue.  HENT:  Positive for mouth dryness.   Eyes:  Positive for dryness.  Respiratory:  Negative for shortness of breath.   Cardiovascular:  Positive for swelling in legs/feet.  Gastrointestinal:  Negative for constipation.  Endocrine: Positive for cold intolerance and heat intolerance.  Genitourinary:  Negative for difficulty urinating.  Musculoskeletal:  Positive for joint pain, gait problem, joint pain, joint swelling, muscle weakness, morning stiffness and muscle tenderness.   Skin:  Negative for color change, rash and sensitivity to sunlight.  Allergic/Immunologic: Negative for susceptible to infections.  Neurological:  Positive for numbness and weakness.  Hematological:  Positive for bruising/bleeding tendency.  Psychiatric/Behavioral:  Negative for sleep disturbance.     PMFS History:  Patient Active Problem List   Diagnosis Date Noted   Chronic allergic conjunctivitis 07/29/2021   Vasomotor rhinitis 07/29/2021   Benign neoplasm of skin of lower limb 05/17/2021   History of neoplasm 05/17/2021   Melanocytic nevi of left upper limb, including shoulder 05/17/2021   Melanocytic nevi of trunk 05/17/2021   Other seborrheic keratosis 05/17/2021   Diarrhea 03/10/2021   Fracture of pelvis ([place]) 01/27/2021   Pain of left hip joint 01/06/2021   Foraminal stenosis of lumbar region 08/03/2020   Cervical spondylosis with myelopathy and radiculopathy 03/09/2020   AKI (acute kidney injury) ([place]) 02/07/2019   Acute respiratory failure with hypoxia ([place]) 02/06/2019   Acute respiratory disease due to COVID-19 virus 02/06/2019   Community acquired pneumonia 02/06/2019   Hyponatremia 02/06/2019   Asthma    Diabetes mellitus without complication ([place])    Closed nondisplaced odontoid fracture with type II morphology ([place]) 01/27/2018   Odontoid fracture ([place]) 01/24/2018   Chest pain 11/14/2017   Transaminitis 06/22/2016   Collagenous colitis 05/23/2016   Psoriasis 05/21/2016   High risk medication use 02/15/2016   Age-related osteoporosis without current pathological fracture 02/15/2016   Trochanteric bursitis, left hip 02/15/2016   Sacroiliitis, not elsewhere  classified ([place]) 02/15/2016   Chronic pain syndrome 02/15/2016   Abnormal weight gain 06/25/2012   Hypertension 07/03/2011   Hypercholesteremia 07/03/2011   Psoriatic arthritis ([place]) 07/03/2011   Osteoarthritis of multiple joints 07/03/2011   Esophageal motility disorder 02/14/2011   Cough 02/14/2011    GERD 05/05/2010    Past Medical History:  Diagnosis Date   Asthma    Albuterol in haler prn   Cataract    immature unsure which eye   Chronic back pain    COVID-19 October/November 2020   Degenerative disk disease    psoriatic   Diabetes mellitus without complication (HCC)    diet controlled   Esophageal motility disorder    Non-specific, see modified barium study/speech path, BP   Fibromyalgia    GERD (gastroesophageal reflux disease)    takes Protonix bid   Headache 2019   History of blood transfusion    post c-section   History of bronchitis    HTN (hypertension)    Hyperlipidemia    takes Pravastatin daily   Lymphocytic colitis 05/26/2010   Responded to Entocort x 3 MOS   Neuropathy    Nonallergic rhinitis    Osteoporosis    gets Boniva every 3 months   Peripheral edema    takes HCTZ daily   Pneumonia 02/2019   Psoriatic arthritis ([place])    Dr. Katherina Right   Psoriatic arthritis ([place])    bilateral hands   Raynaud's disease    Seasonal allergies    Shingles 2020   SUI (stress urinary incontinence, female)    Urinary urgency     Family History  Problem Relation Age of Onset   Diabetes Mother    Pancreatitis Mother    Arthritis Mother        psoriatic    Fibromyalgia Mother    Rheumatic fever Father    Diabetes Other        grandparents   Skin cancer Other        grandfather   Hypertension Other        grandparent   Congestive Heart Failure Other        grandfather   Parkinson's disease Other        grandmother   Colon cancer Maternal Uncle    Fibromyalgia Sister    Rheum arthritis Sister    Diabetes Sister    Fibromyalgia Sister    Diabetes Sister    Rheum arthritis Sister    Hypertension Son    Colon polyps Neg Hx    Past Surgical History:  Procedure Laterality Date   ANTERIOR CERVICAL DECOMP/DISCECTOMY FUSION N/A 03/09/2020   Procedure: ANTERIOR CERVICAL DECOMPRESSION/DISCECTOMY FUSION, INTERBODY PROSTHESIS, PLATE SCREWS CERVICAL FIVE-SIX,  CERVICAL SIX-SEVEN;  Surgeon: Newman Pies, MD;  Location: [facility];  Service: Neurosurgery;  Laterality: N/A;   BACK SURGERY  2003   BIOPSY  04/17/2016   Procedure: BIOPSY;  Surgeon: Danie Binder, MD;  [facility];  Service: Endoscopy;;  random colon bx's   CERVICAL SPINE SURGERY  09/09/2017   CESAREAN SECTION  1974, 1978       COLONOSCOPY  06/19/2008   ZHY:[id number] internal hemorrhoids/mild sigmoin colon diverticulosis   COLONOSCOPY  2012   Dr. Gala Romney: normal rectum, diverticula, lymphocytic colitis    COLONOSCOPY WITH PROPOFOL N/A 04/17/2016   Dr. Oneida Alar: Normal terminal ileum, internal/external hemorrhoids, random colon biopsies consistent with collagenous colitis   [place]   spontaneous abortion   ESOPHAGOGASTRODUODENOSCOPY  06/19/2008  NWG:[id number] gastritis   LUMBAR DISC ARTHROPLASTY  7/98   LUMBAR FUSION  6/03, 11/08, 11/14   L4-5 fusion, L2-3 fusion, L1-2   LUMBAR FUSION  08/03/2020   L4-L5   LUMBAR LAMINECTOMY  03/2020   ODONTOID SCREW INSERTION N/A 01/27/2018   Procedure: ODONTOID SCREW INSERTION;  Surgeon: Newman Pies, MD;  Location: [facility];  Service: Neurosurgery;  Laterality: N/A;  ODONTOID SCREW INSERTION   TONSILLECTOMY     TONSILLECTOMY AND ADENOIDECTOMY     TUBAL LIGATION     Social History   Social History Narrative   ATTENDS [employer]. 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   Immunization History  Administered Date(s) Administered   Influenza,inj,Quad PF,6+ Mos 01/21/2017   Influenza-Unspecified 01/30/2017   Moderna Sars-Covid-2 Vaccination 06/01/2019, 07/02/2019   Pneumococcal Polysaccharide-23 02/19/2013   Tdap 09/10/2016     Objective: Vital Signs: BP (!) 160/71 (BP Location: Left Arm, Patient Position: Sitting, Cuff Size: Normal)   Pulse 98   Resp 16   Ht '4\' 11"'$  (1.499 m)   Wt 135 lb 12.8 oz (61.6 kg)   LMP 02/01/1999   BMI 27.43 kg/m    Physical Exam Vitals and nursing note reviewed.   Constitutional:      Appearance: She is well-developed.  HENT:     Head: Normocephalic and atraumatic.  Eyes:     Conjunctiva/sclera: Conjunctivae normal.  Cardiovascular:     Rate and Rhythm: Normal rate and regular rhythm.     Heart sounds: Normal heart sounds.  Pulmonary:     Effort: Pulmonary effort is normal.     Breath sounds: Normal breath sounds.  Abdominal:     General: Bowel sounds are normal.     Palpations: Abdomen is soft.  Musculoskeletal:     Cervical back: Normal range of motion.  Lymphadenopathy:     Cervical: No cervical adenopathy.  Skin:    General: Skin is warm and dry.     Capillary Refill: Capillary refill takes less than 2 seconds.  Neurological:     Mental Status: She is alert and oriented to person, place, and time.  Psychiatric:        Behavior: Behavior normal.      Musculoskeletal Exam: He had limited range of motion of her cervical spine.  She had thoracic kyphosis with tenderness in the midthoracic region.  She had painful range of motion of her lumbar spine.  Shoulder joints and elbow joints in good range of motion.  She had bilateral CMC PIP and DIP thickening with incomplete fist formation.  No synovitis was noted.  Synovial thickening was noted over PIP and DIP joints.  She had limited range of motion of bilateral hip joints with tenderness over SI joints and trochanteric bursa.  Both knee joints in good range of motion.  She had edema over her left lower extremity.  There was no tenderness over ankles or MTPs.  CDAI Exam: CDAI Score: -- Patient Global: --; Provider Global: -- Swollen: --; Tender: -- Joint Exam 09/20/2021   No joint exam has been documented for this visit   There is currently no information documented on the homunculus. Go to the Rheumatology activity and complete the homunculus joint exam.  Investigation: No additional findings.  Imaging: DG Chest 2 View  Result Date: 09/02/2021 CLINICAL DATA:  Chest and back pain.  EXAM: CHEST - 2 VIEW COMPARISON:  07/25/2020 and CT on 08/01/2020 FINDINGS: The heart size and mediastinal contours are within normal limits. Both lungs are clear. Several  old left rib fractures are again seen. Cervical and lumbar spine fusion hardware again noted. IMPRESSION: No active cardiopulmonary disease. Electronically Signed   By: Marlaine Hind M.D.   On: 09/02/2021 08:04    Recent Labs: Lab Results  Component Value Date   WBC 13.1 (H) 06/19/2021   HGB 13.1 06/19/2021   PLT 301 06/19/2021   NA 137 06/19/2021   K 4.0 06/19/2021   CL 105 06/19/2021   CO2 22 06/19/2021   GLUCOSE 141 (H) 06/19/2021   BUN 27 (H) 06/19/2021   CREATININE 0.89 06/19/2021   BILITOT 0.3 06/19/2021   ALKPHOS 70 12/21/2020   AST 24 06/19/2021   ALT 28 06/19/2021   PROT 6.5 06/19/2021   ALBUMIN 4.4 12/21/2020   CALCIUM 10.0 06/19/2021   GFRAA 65 06/08/2020   QFTBGOLD Negative 05/05/2015   QFTBGOLDPLUS NEGATIVE 10/19/2020    Speciality Comments: MTX- high LFTs, PLQ,SSZ,Imuran, Humira- allergy, Enbrel , Remicade - inadequate response  Procedures:  No procedures performed Allergies: Adalimumab, Imuran [azathioprine sodium], Methotrexate, Plaquenil [hydroxychloroquine sulfate], Sulfonamide derivatives, Azathioprine, Ceftin [cefuroxime axetil], Cefuroxime, Morphine sulfate, and Morphine   Assessment / Plan:     Visit Diagnoses: Psoriatic arthritis (HCC)-she continues to have pain and discomfort in multiple joints.  She has severe psoriatic arthritis.  No active synovitis was noted.  Psoriasis-no psoriasis lesions were noted.  High risk medication use - Taltz 80 mg sq injections q 4 weeks, Arava '20mg'$  1 tablet daily, Otezla 30 mg 1 tablet by mouth twice daily, and prednisone 5 mg 1 tablet daily. - Plan: CBC with Differential/Platelet, COMPLETE METABOLIC PANEL WITH GFR, QuantiFERON-TB Gold Plus today and then every 3 months.  TB gold will be obtained annually.  Information regarding immunization was placed in  the AVS.  She was also advised to stop [place] if she develops an infection and resume after the infection resolves.  Chronic thumb pain, both - Left CMC injection on July 05, 2021.  Patient states that the injection helped for a short time and now the pain has returned in bilateral thumb.  Chronic left SI joint pain - Pelvic fracture December 28, 2020 after a fall.  Trochanteric bursitis of both hips-she continues to have some tenderness over trochanteric bursa.  IT band stretches were discussed.  Primary osteoarthritis of both knees-pain in her both knee joints.  No warmth swelling or effusion was noted.  DDD (degenerative disc disease), cervical-she had limited range of motion of her cervical spine.  DDD (degenerative disc disease), thoracic -she has been experiencing increased midthoracic pain.  She states the pain has been radiating into her chest.  She had MRI yesterday by Dr. Arnoldo Morale.  She will follow-up with Dr. Arnoldo Morale.  DDD (degenerative disc disease), lumbar-she had limited painful range of motion of the lumbar spine.  Raynaud's syndrome without gangrene-currently not active.  Age-related osteoporosis without current pathological fracture - She is on Prolia 60 mg subcu every 6 months by Dr. Chalmers Cater.  Chronic pain syndrome -she came off the pain medications as they were not effective.  Essential hypertension-her systolic blood pressure was elevated.  The medical problems listed as follows:  Adrenal insufficiency ([place]) - Followed by Dr. Chalmers Cater  History of type 2 diabetes mellitus  History of gastroesophageal reflux (GERD)  Esophageal motility disorder  Collagenous colitis  Orders: Orders Placed This Encounter  Procedures   CBC with Differential/Platelet   COMPLETE METABOLIC PANEL WITH GFR   QuantiFERON-TB Gold Plus   No orders of the defined  types were placed in this encounter.    Follow-Up Instructions: Return in about 3 months (around 12/21/2021) for  Psoriatic arthritis.   Bo Merino, MD  Note - This record has been created using Editor, commissioning.  Chart creation errors have been sought, but may not always  have been located. Such creation errors do not reflect on  the standard of medical care.

## 2021-09-22 DIAGNOSIS — E119 Type 2 diabetes mellitus without complications: Secondary | ICD-10-CM | POA: Diagnosis not present

## 2021-09-22 DIAGNOSIS — E7800 Pure hypercholesterolemia, unspecified: Secondary | ICD-10-CM | POA: Diagnosis not present

## 2021-09-22 DIAGNOSIS — M810 Age-related osteoporosis without current pathological fracture: Secondary | ICD-10-CM | POA: Diagnosis not present

## 2021-09-22 DIAGNOSIS — I10 Essential (primary) hypertension: Secondary | ICD-10-CM | POA: Diagnosis not present

## 2021-09-24 LAB — CBC WITH DIFFERENTIAL/PLATELET
Absolute Monocytes: 1023 cells/uL — ABNORMAL HIGH (ref 200–950)
Basophils Absolute: 131 cells/uL (ref 0–200)
Basophils Relative: 1.1 %
Eosinophils Absolute: 536 cells/uL — ABNORMAL HIGH (ref 15–500)
Eosinophils Relative: 4.5 %
HCT: 40.3 % (ref 35.0–45.0)
Hemoglobin: 13.2 g/dL (ref 11.7–15.5)
Lymphs Abs: 2642 cells/uL (ref 850–3900)
MCH: 31.9 pg (ref 27.0–33.0)
MCHC: 32.8 g/dL (ref 32.0–36.0)
MCV: 97.3 fL (ref 80.0–100.0)
MPV: 10.9 fL (ref 7.5–12.5)
Monocytes Relative: 8.6 %
Neutro Abs: 7568 cells/uL (ref 1500–7800)
Neutrophils Relative %: 63.6 %
Platelets: 264 10*3/uL (ref 140–400)
RBC: 4.14 10*6/uL (ref 3.80–5.10)
RDW: 12.4 % (ref 11.0–15.0)
Total Lymphocyte: 22.2 %
WBC: 11.9 10*3/uL — ABNORMAL HIGH (ref 3.8–10.8)

## 2021-09-24 LAB — QUANTIFERON-TB GOLD PLUS
Mitogen-NIL: 8.86 IU/mL
NIL: 0.02 IU/mL
QuantiFERON-TB Gold Plus: NEGATIVE
TB1-NIL: 0.01 IU/mL
TB2-NIL: 0.01 IU/mL

## 2021-09-24 LAB — COMPLETE METABOLIC PANEL WITH GFR
AG Ratio: 1.9 (calc) (ref 1.0–2.5)
ALT: 23 U/L (ref 6–29)
AST: 22 U/L (ref 10–35)
Albumin: 4.1 g/dL (ref 3.6–5.1)
Alkaline phosphatase (APISO): 62 U/L (ref 37–153)
BUN: 22 mg/dL (ref 7–25)
CO2: 23 mmol/L (ref 20–32)
Calcium: 9.6 mg/dL (ref 8.6–10.4)
Chloride: 106 mmol/L (ref 98–110)
Creat: 0.98 mg/dL (ref 0.60–1.00)
Globulin: 2.2 g/dL (calc) (ref 1.9–3.7)
Glucose, Bld: 114 mg/dL — ABNORMAL HIGH (ref 65–99)
Potassium: 3.9 mmol/L (ref 3.5–5.3)
Sodium: 139 mmol/L (ref 135–146)
Total Bilirubin: 0.3 mg/dL (ref 0.2–1.2)
Total Protein: 6.3 g/dL (ref 6.1–8.1)
eGFR: 62 mL/min/{1.73_m2} (ref >=60)

## 2021-10-04 DIAGNOSIS — M810 Age-related osteoporosis without current pathological fracture: Secondary | ICD-10-CM | POA: Diagnosis not present

## 2021-10-06 DIAGNOSIS — H02831 Dermatochalasis of right upper eyelid: Secondary | ICD-10-CM | POA: Diagnosis not present

## 2021-10-06 DIAGNOSIS — H02834 Dermatochalasis of left upper eyelid: Secondary | ICD-10-CM | POA: Diagnosis not present

## 2021-10-06 DIAGNOSIS — H04203 Unspecified epiphora, bilateral lacrimal glands: Secondary | ICD-10-CM | POA: Diagnosis not present

## 2021-10-06 DIAGNOSIS — H57813 Brow ptosis, bilateral: Secondary | ICD-10-CM | POA: Diagnosis not present

## 2021-10-13 ENCOUNTER — Other Ambulatory Visit: Payer: Self-pay | Admitting: Physician Assistant

## 2021-10-13 DIAGNOSIS — M546 Pain in thoracic spine: Secondary | ICD-10-CM | POA: Diagnosis not present

## 2021-10-16 ENCOUNTER — Other Ambulatory Visit: Payer: Self-pay | Admitting: Neurosurgery

## 2021-10-16 DIAGNOSIS — M546 Pain in thoracic spine: Secondary | ICD-10-CM

## 2021-10-16 NOTE — Telephone Encounter (Signed)
Next Visit: 12/21/2021  Last Visit: 09/20/2021  Last Fill: 07/17/2021  Dx: Psoriatic arthritis   Current Dose per office note on 09/20/2021: prednisone 5 mg 1 tablet daily  Okay to refill Prednisone?

## 2021-10-20 ENCOUNTER — Ambulatory Visit
Admission: RE | Admit: 2021-10-20 | Discharge: 2021-10-20 | Disposition: A | Discharge status: Home / Self Care | Payer: PPO | Source: Ambulatory Visit | Attending: Neurosurgery | Admitting: Neurosurgery

## 2021-10-20 DIAGNOSIS — M546 Pain in thoracic spine: Secondary | ICD-10-CM

## 2021-10-20 DIAGNOSIS — M47814 Spondylosis without myelopathy or radiculopathy, thoracic region: Secondary | ICD-10-CM | POA: Diagnosis not present

## 2021-10-20 DIAGNOSIS — M4804 Spinal stenosis, thoracic region: Secondary | ICD-10-CM | POA: Diagnosis not present

## 2021-10-20 MED ORDER — IOPAMIDOL (ISOVUE-M 200) INJECTION 41%: 1.0000 mL | Freq: Once | INTRAMUSCULAR | Status: DC

## 2021-10-20 MED ORDER — TRIAMCINOLONE ACETONIDE 40 MG/ML IJ SUSP (RADIOLOGY): 60.0000 mg | Freq: Once | INTRAMUSCULAR | Status: DC

## 2021-10-20 NOTE — Discharge Instructions (Signed)

## 2021-10-23 ENCOUNTER — Encounter: Payer: Self-pay | Admitting: Podiatry

## 2021-10-23 ENCOUNTER — Ambulatory Visit: Payer: PPO | Admitting: Podiatry

## 2021-10-23 DIAGNOSIS — E119 Type 2 diabetes mellitus without complications: Secondary | ICD-10-CM

## 2021-10-23 DIAGNOSIS — L409 Psoriasis, unspecified: Secondary | ICD-10-CM

## 2021-10-23 DIAGNOSIS — Q828 Other specified congenital malformations of skin: Secondary | ICD-10-CM | POA: Diagnosis not present

## 2021-10-23 DIAGNOSIS — Z789 Other specified health status: Secondary | ICD-10-CM | POA: Insufficient documentation

## 2021-10-23 DIAGNOSIS — L602 Onychogryphosis: Secondary | ICD-10-CM | POA: Diagnosis not present

## 2021-10-29 NOTE — Progress Notes (Signed)
Subjective:  Patient ID: Tonya Hall, female    DOB: 11-Sep-1950,  MRN: [id number]  Tonya Hall presents to clinic today for preventative diabetic foot care and painful porokeratotic lesion(s) right lower extremity and painful mycotic toenails that limit ambulation. Painful toenails interfere with ambulation. Aggravating factors include wearing enclosed shoe gear. Pain is relieved with periodic professional debridement. Painful porokeratotic lesions are aggravated when weightbearing with and without shoegear. Pain is relieved with periodic professional debridement.  Patient states blood glucose was 128 mg/dl  last night .    Last A1c was 6.9%.  New problem(s): None.   PCP is [name], MD , and last visit was  2022.  Allergies  Allergen Reactions   Adalimumab Rash and Other (See Comments)    FEVER, Fast pulse Other reaction(s): Unknown   Imuran [Azathioprine Sodium] Rash    Denies Airway involvement   Methotrexate Other (See Comments)    Liver Enzyme elevation - after 1 month of use Other reaction(s): Unknown   Plaquenil [Hydroxychloroquine Sulfate] Rash    Denies airway involvement.    Sulfonamide Derivatives Rash    Occurred with Sulfasalazine. Rash only.    Azathioprine     Other reaction(s): Unknown   Ceftin [Cefuroxime Axetil] Nausea Only    Upset stomach   Cefuroxime     Other reaction(s): Unknown   Ezetimibe     Other reaction(s): Unknown   Insulin Detemir     Other reaction(s): rash   Lisinopril     Other reaction(s): Unknown   Metformin Hcl     Other reaction(s): Unknown   Morphine Sulfate     Other reaction(s): Unknown   Pravastatin Sodium     Other reaction(s): Liver abnormalities   Sulfamethoxazole-Trimethoprim     Other reaction(s): Unknown   Morphine Nausea And Vomiting    IV morphine caused N and V  After surgery.   Has tolerated [place] in the past     Review of Systems: Negative except as noted in the HPI.  Objective: No changes noted in  today's physical examination. Tonya Hall is a pleasant 71 y.o. female in NAD. AAO X 3.  Vascular Examination: Neurovascular status unchanged bilaterally. Capillary refill time to digits immediate b/l. Palpable pedal pulses b/l LE. Pedal hair absent. No pain with calf compression b/l. Lower extremity skin temperature gradient warm to cool. Red to purple discoloration consistent with Raynaud's. No ischemia or gangrene. No clubbing b/l.  Dermatological Examination: No open wounds b/l LE. No interdigital macerations noted b/l LE. Elongated psoriatic toenails 1-5 b/l, with pitting of nailplates and tenderness to palpation. Porokeratotic lesion(s) submet head 4 right foot. No erythema, no edema, no drainage, no fluctuance.  Musculoskeletal Examination: Muscle strength 5/5 to all lower extremity muscle groups bilaterally. Hallux valgus with bunion deformity noted right foot. Hammertoe deformity noted 2-5 b/l.  Neurological Examination: Protective sensation intact 5/5 intact bilaterally with 10g monofilament b/l. Vibratory sensation intact b/l. Proprioception intact bilaterally.  Assessment/Plan: 1. Psoriasis of nail   2. Overgrown toenails   3. Porokeratosis   4. Diabetes mellitus without complication ([place])     -Patient was evaluated and treated. All patient's and/or POA's questions/concerns answered on today's visit. -Nondystrophic toenails trimmed 1-5 bilaterally. -Porokeratotic lesion(s) submet head 4 right foot pared and enucleated with sterile scalpel blade without incident. Total number of lesions debrided=1. -Patient/POA to call should there be question/concern in the interim.   Return in about 9 weeks (around 12/25/2021).  Marzetta Board, DPM

## 2021-10-31 ENCOUNTER — Telehealth: Payer: Self-pay | Admitting: Rheumatology

## 2021-10-31 DIAGNOSIS — I10 Essential (primary) hypertension: Secondary | ICD-10-CM | POA: Diagnosis not present

## 2021-10-31 DIAGNOSIS — E785 Hyperlipidemia, unspecified: Secondary | ICD-10-CM | POA: Diagnosis not present

## 2021-10-31 DIAGNOSIS — R609 Edema, unspecified: Secondary | ICD-10-CM | POA: Diagnosis not present

## 2021-10-31 NOTE — Telephone Encounter (Signed)
Patient has been experiencing chronic SI joint pain.  Patient advised the cortisone injection increases the risk of osteoporosis and bone fracture.  If the pain is severe and states she wants injection despite being aware of the risk factors and has been scheduled for the appointment for SI joint injection on 11/01/2021 at 3:00 pm.

## 2021-10-31 NOTE — Telephone Encounter (Signed)
LMOM for patient to call office to discuss lab results. 

## 2021-10-31 NOTE — Telephone Encounter (Signed)
Patient has been experiencing chronic SI joint pain.  The cortisone injection increases the risk of osteoporosis and bone fracture.  If the pain is severe and she wants injection despite being aware of the risk factors then please schedule the appointment for SI joint injection.

## 2021-10-31 NOTE — Telephone Encounter (Signed)
Patient called the office stating she desperately needs an injection in her SI joint. Patient states Dr. [name] has been hesitant to give her this injection. Patient states she is begging.

## 2021-11-01 ENCOUNTER — Ambulatory Visit: Payer: PPO | Attending: Rheumatology | Admitting: Rheumatology

## 2021-11-01 VITALS — BP 129/73 | HR 84

## 2021-11-01 DIAGNOSIS — M533 Sacrococcygeal disorders, not elsewhere classified: Secondary | ICD-10-CM | POA: Diagnosis not present

## 2021-11-01 DIAGNOSIS — G8929 Other chronic pain: Secondary | ICD-10-CM | POA: Diagnosis not present

## 2021-11-01 MED ORDER — TRIAMCINOLONE ACETONIDE 40 MG/ML IJ SUSP
40.0000 mg | INTRAMUSCULAR | Status: AC | PRN
  Administered 2021-11-01: 40 mg via INTRA_ARTICULAR

## 2021-11-01 MED ORDER — LIDOCAINE HCL 1 % IJ SOLN
1.0000 mL | INTRAMUSCULAR | Status: AC | PRN
  Administered 2021-11-01: 1 mL

## 2021-11-01 NOTE — Progress Notes (Signed)
   Procedure Note  Patient: Tonya Hall             Date of Birth: 10-16-1950           MRN: [id number]             Visit Date: 11/01/2021  Procedures: Visit Diagnoses:  1. Chronic left SI joint pain   Patient continues to have pain and discomfort in the left SI joint.  She came in today to get left SI joint injection.  The last cortisone injection in the left SI joint was on August 31, 2020.  Sacroiliac Joint Inj on 11/01/2021 3:36 PM Indications: pain Details: 27 G 1.5 in needle, posterior approach Medications: 1 mL lidocaine 1 %; 40 mg triamcinolone acetonide 40 MG/ML Aspirate: 0 mL Outcome: tolerated well, no immediate complications Procedure, treatment alternatives, risks and benefits explained, specific risks discussed. Consent was given by the patient. Immediately prior to procedure a time out was called to verify the correct patient, procedure, equipment, support staff and site/side marked as required. Patient was prepped and draped in the usual sterile fashion.    Postprocedure instructions were given.    Bo Merino, MD

## 2021-11-08 ENCOUNTER — Other Ambulatory Visit: Payer: Self-pay | Admitting: Rheumatology

## 2021-11-08 DIAGNOSIS — L409 Psoriasis, unspecified: Secondary | ICD-10-CM

## 2021-11-08 MED ORDER — OTEZLA 30 MG PO TABS: 30.0000 mg | ORAL_TABLET | Freq: Two times a day (BID) | ORAL | 0 refills | Status: DC

## 2021-11-08 NOTE — Telephone Encounter (Signed)
Next Visit: 12/21/2021   Last Visit: 09/20/2021   Last Fill: 08/16/2021  Dx: Psoriatic arthritis    Current Dose per office note on 09/20/2021: Otezla 30 mg 1 tablet by mouth twice daily  Labs: 09/20/2021 glucose is mildly elevated, not a fasting sample, white cell count is elevated due to prednisone use.  Okay to refill [name]?

## 2021-11-08 NOTE — Telephone Encounter (Signed)
Patient left a voicemail requesting a refill of Otezla '30mg'$  be sent to [facility].

## 2021-11-21 ENCOUNTER — Telehealth: Payer: Self-pay | Admitting: Internal Medicine

## 2021-11-21 ENCOUNTER — Other Ambulatory Visit: Payer: Self-pay | Admitting: Physician Assistant

## 2021-11-21 NOTE — Telephone Encounter (Signed)
Spoke with patient. She reports she is in donut hole. She already gets med assistance from [place] for another medication so she may qualify for Repatha assistance also.   MyChart message sent with website link

## 2021-11-21 NOTE — Telephone Encounter (Signed)
Next Visit: 12/21/2021   Last Visit: 09/20/2021   Last Fill: 08/25/2021  Current Dose per office note on 09/20/2021: not discussed  Okay to refill Topamax?

## 2021-11-21 NOTE — Telephone Encounter (Signed)
Will forward to Sheral Apley, RN for advice.

## 2021-11-21 NOTE — Telephone Encounter (Signed)
Pt c/o medication issue:  1. Name of Medication: Evolocumab (REPATHA SURECLICK) 797 MG/ML SOAJ  2. How are you currently taking this medication (dosage and times per day)?   3. Are you having a reaction (difficulty breathing--STAT)?   4. What is your medication issue?  Pt states she has gone into the donut hole for this medication and it will cost over $300 for this medication. Would like a call back to discuss possible assistance and samples. Please advise.

## 2021-11-28 ENCOUNTER — Other Ambulatory Visit: Payer: Self-pay | Admitting: Physician Assistant

## 2021-11-28 NOTE — Telephone Encounter (Signed)
Next Visit: 12/15/2021  Last Visit: 09/20/2021  Last Fill: 08/25/2021  DX: Psoriatic arthritis   Current Dose per office note on 09/20/2021: Arava '20mg'$  1 tablet daily  Labs: 09/20/2021 TB Gold is negative, glucose is mildly elevated, not a fasting sample, white cell count is elevated due to prednisone use.  Okay to refill arava?

## 2021-11-29 DIAGNOSIS — H02834 Dermatochalasis of left upper eyelid: Secondary | ICD-10-CM | POA: Diagnosis not present

## 2021-11-29 DIAGNOSIS — H02831 Dermatochalasis of right upper eyelid: Secondary | ICD-10-CM | POA: Diagnosis not present

## 2021-11-29 DIAGNOSIS — H11823 Conjunctivochalasis, bilateral: Secondary | ICD-10-CM | POA: Diagnosis not present

## 2021-11-29 DIAGNOSIS — Z01818 Encounter for other preprocedural examination: Secondary | ICD-10-CM | POA: Diagnosis not present

## 2021-11-30 ENCOUNTER — Telehealth: Payer: Self-pay | Admitting: Rheumatology

## 2021-11-30 NOTE — Telephone Encounter (Signed)
Patient called stating she is scheduled for eyelid surgery on 12/30/21 and requested a return call to let her know which medications she needs to stop taking.

## 2021-11-30 NOTE — Telephone Encounter (Signed)
Patient advised to discontinue leflunomide and Taltz.  Leflunomide should be stopped 1 week prior to surgery and Taltz 1 month prior to surgery.  Patient advised she may resume both medications 2 weeks after the surgery if she has no infection and gets clearance from the surgeon. Patient expressed understanding.

## 2021-11-30 NOTE — Telephone Encounter (Signed)
Discontinue leflunomide and Taltz.  Leflunomide should be stopped 1 week prior to surgery and Taltz 1 month prior to surgery.  She may resume both medications 2 weeks after the surgery if she has no infection and gets clearance from the surgeon.

## 2021-11-30 NOTE — Telephone Encounter (Signed)
Patient is on Prednisone, Noberto Retort and Materials engineer. Please advise.

## 2021-12-01 NOTE — Progress Notes (Deleted)
Office Visit Note  Patient: Tonya Hall             Date of Birth: 04-18-1950           MRN: [id number]             PCP: [name], MD Referring: [name], MD Visit Date: 12/15/2021 Occupation: '@GUAROCC'$ @  Subjective:    History of Present Illness: Tonya Hall is a 71 y.o. female with history of psoriatic arthritis, osteoarthritis, osteoporosis, and DDD.  Patient remains on Taltz 80 mg sq injections q 4 weeks, Arava '20mg'$  1 tablet daily, Otezla 30 mg 1 tablet by mouth twice daily, and prednisone 5 mg 1 tablet daily.  She remains on Prolia 60 mg subcutaneous injections every 6 months for management of osteoporosis prescribed by Dr. Chalmers Cater.   CBC and CMP drawn on 09/20/2021.  Orders for CBC and CMP released today.  Her next lab work will be due in December and every 3 months to monitor for drug toxicity.  Standing orders for CBC and CMP were placed today. TB Gold negative on 09/20/2021. Discussed the importance of holding [place] if she develops signs or symptoms of an infection and to resume once the infection has completely cleared.    Activities of Daily Living:  Patient reports morning stiffness for *** {minute/hour:19697}.   Patient {ACTIONS;DENIES/REPORTS:21021675::"Denies"} nocturnal pain.  Difficulty dressing/grooming: {ACTIONS;DENIES/REPORTS:21021675::"Denies"} Difficulty climbing stairs: {ACTIONS;DENIES/REPORTS:21021675::"Denies"} Difficulty getting out of chair: {ACTIONS;DENIES/REPORTS:21021675::"Denies"} Difficulty using hands for taps, buttons, cutlery, and/or writing: {ACTIONS;DENIES/REPORTS:21021675::"Denies"}  No Rheumatology ROS completed.   PMFS History:  Patient Active Problem List   Diagnosis Date Noted   Statin not tolerated 10/23/2021   Chronic allergic conjunctivitis 07/29/2021   Vasomotor rhinitis 07/29/2021   Benign neoplasm of skin of lower limb 05/17/2021   History of neoplasm 05/17/2021   Melanocytic nevi of left upper limb,  including shoulder 05/17/2021   Melanocytic nevi of trunk 05/17/2021   Other seborrheic keratosis 05/17/2021   Diarrhea 03/10/2021   Fracture of pelvis ([place]) 01/27/2021   Pain of left hip joint 01/06/2021   Foraminal stenosis of lumbar region 08/03/2020   Cervical spondylosis with myelopathy and radiculopathy 03/09/2020   AKI (acute kidney injury) ([place]) 02/07/2019   Acute respiratory failure with hypoxia ([place]) 02/06/2019   Acute respiratory disease due to COVID-19 virus 02/06/2019   Community acquired pneumonia 02/06/2019   Hyponatremia 02/06/2019   Asthma    Diabetes mellitus without complication ([place])    Closed nondisplaced odontoid fracture with type II morphology ([place]) 01/27/2018   Odontoid fracture ([place]) 01/24/2018   Chest pain 11/14/2017   Transaminitis 06/22/2016   Collagenous colitis 05/23/2016   Psoriasis 05/21/2016   High risk medication use 02/15/2016   Age-related osteoporosis without current pathological fracture 02/15/2016   Trochanteric bursitis, left hip 02/15/2016   Sacroiliitis, not elsewhere classified ([place]) 02/15/2016   Chronic pain syndrome 02/15/2016   Abnormal weight gain 06/25/2012   Hypertension 07/03/2011   Hypercholesteremia 07/03/2011   Psoriatic arthritis ([place]) 07/03/2011   Osteoarthritis of multiple joints 07/03/2011   Esophageal motility disorder 02/14/2011   Cough 02/14/2011   GERD 05/05/2010    Past Medical History:  Diagnosis Date   Asthma    Albuterol in haler prn   Cataract    immature unsure which eye   Chronic back pain    COVID-19 October/November 2020   Degenerative disk disease    psoriatic   Diabetes mellitus without complication ([place])    diet controlled  Esophageal motility disorder    Non-specific, see modified barium study/speech path, BP   Fibromyalgia    GERD (gastroesophageal reflux disease)    takes Protonix bid   Headache 2019   History of blood transfusion    post c-section   History of bronchitis    HTN  (hypertension)    Hyperlipidemia    takes Pravastatin daily   Lymphocytic colitis 05/26/2010   Responded to Entocort x 3 MOS   Neuropathy    Nonallergic rhinitis    Osteoporosis    gets Boniva every 3 months   Peripheral edema    takes HCTZ daily   Pneumonia 02/2019   Psoriatic arthritis ([place])    Dr. Katherina Right   [facility]    bilateral hands   Raynaud's disease    Seasonal allergies    Shingles 2020   SUI (stress urinary incontinence, female)    Urinary urgency     Family History  Problem Relation Age of Onset   Diabetes Mother    Pancreatitis Mother    Arthritis Mother        psoriatic    Fibromyalgia Mother    Rheumatic fever Father    Diabetes Other        grandparents   Skin cancer Other        grandfather   Hypertension Other        grandparent   Congestive Heart Failure Other        grandfather   Parkinson's disease Other        grandmother   Colon cancer Maternal Uncle    Fibromyalgia Sister    Rheum arthritis Sister    Diabetes Sister    Fibromyalgia Sister    Diabetes Sister    Rheum arthritis Sister    Hypertension Son    Colon polyps Neg Hx    Past Surgical History:  Procedure Laterality Date   ANTERIOR CERVICAL DECOMP/DISCECTOMY FUSION N/A 03/09/2020   Procedure: ANTERIOR CERVICAL DECOMPRESSION/DISCECTOMY FUSION, INTERBODY PROSTHESIS, PLATE SCREWS CERVICAL FIVE-SIX, CERVICAL SIX-SEVEN;  Surgeon: Newman Pies, MD;  Location: [place];  Service: Neurosurgery;  Laterality: N/A;   BACK SURGERY  2003   BIOPSY  04/17/2016   Procedure: BIOPSY;  Surgeon: Danie Binder, MD;  [facility];  Service: Endoscopy;;  random colon bx's   CERVICAL SPINE SURGERY  09/09/2017   CESAREAN SECTION  1974, 1978       COLONOSCOPY  06/19/2008   OBS:JGGEZ internal hemorrhoids/mild sigmoin colon diverticulosis   COLONOSCOPY  2012   Dr. Gala Romney: normal rectum, diverticula, lymphocytic colitis    COLONOSCOPY WITH PROPOFOL N/A 04/17/2016   Dr.  Oneida Alar: Normal terminal ileum, internal/external hemorrhoids, random colon biopsies consistent with collagenous colitis   DILATION AND CURETTAGE OF UTERUS  1977   spontaneous abortion   ESOPHAGOGASTRODUODENOSCOPY  06/19/2008   MOQ:HUTMLYYT gastritis   LUMBAR DISC ARTHROPLASTY  7/98   LUMBAR FUSION  6/03, 11/08, 11/14   L4-5 fusion, L2-3 fusion, L1-2   LUMBAR FUSION  08/03/2020   L4-L5   LUMBAR LAMINECTOMY  03/2020   ODONTOID SCREW INSERTION N/A 01/27/2018   Procedure: ODONTOID SCREW INSERTION;  Surgeon: Newman Pies, MD;  Location: [place];  Service: Neurosurgery;  Laterality: N/A;  ODONTOID SCREW INSERTION   TONSILLECTOMY     TONSILLECTOMY AND ADENOIDECTOMY     TUBAL LIGATION     Social History   Social History Narrative   ATTENDS [employer]. 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   Immunization History  Administered Date(s) Administered   Influenza,inj,Quad PF,6+ Mos 01/21/2017   Influenza-Unspecified 01/30/2017   Moderna Sars-Covid-2 Vaccination 06/01/2019, 07/02/2019   Pneumococcal Polysaccharide-23 02/19/2013   Tdap 09/10/2016     Objective: Vital Signs: LMP 02/01/1999    Physical Exam Vitals and nursing note reviewed.  Constitutional:      Appearance: She is well-developed.  HENT:     Head: Normocephalic and atraumatic.  Eyes:     Conjunctiva/sclera: Conjunctivae normal.  Cardiovascular:     Rate and Rhythm: Normal rate and regular rhythm.     Heart sounds: Normal heart sounds.  Pulmonary:     Effort: Pulmonary effort is normal.     Breath sounds: Normal breath sounds.  Abdominal:     General: Bowel sounds are normal.     Palpations: Abdomen is soft.  Musculoskeletal:     Cervical back: Normal range of motion.  Skin:    General: Skin is warm and dry.     Capillary Refill: Capillary refill takes less than 2 seconds.  Neurological:     Mental Status: She is alert and oriented to person, place, and time.  Psychiatric:        Behavior: Behavior normal.       Musculoskeletal Exam: ***  CDAI Exam: CDAI Score: -- Patient Global: --; Provider Global: -- Swollen: --; Tender: -- Joint Exam 12/15/2021   No joint exam has been documented for this visit   There is currently no information documented on the homunculus. Go to the Rheumatology activity and complete the homunculus joint exam.  Investigation: No additional findings.  Imaging: No results found.  Recent Labs: Lab Results  Component Value Date   WBC 11.9 (H) 09/20/2021   HGB 13.2 09/20/2021   PLT 264 09/20/2021   NA 139 09/20/2021   K 3.9 09/20/2021   CL 106 09/20/2021   CO2 23 09/20/2021   GLUCOSE 114 (H) 09/20/2021   BUN 22 09/20/2021   CREATININE 0.98 09/20/2021   BILITOT 0.3 09/20/2021   ALKPHOS 70 12/21/2020   AST 22 09/20/2021   ALT 23 09/20/2021   PROT 6.3 09/20/2021   ALBUMIN 4.4 12/21/2020   CALCIUM 9.6 09/20/2021   GFRAA 65 06/08/2020   QFTBGOLD Negative 05/05/2015   QFTBGOLDPLUS NEGATIVE 09/20/2021    Speciality Comments: MTX- high LFTs, PLQ,SSZ,Imuran, Humira- allergy, Enbrel , Remicade - inadequate response  Procedures:  No procedures performed Allergies: Adalimumab, Imuran [azathioprine sodium], Methotrexate, Plaquenil [hydroxychloroquine sulfate], Sulfonamide derivatives, Azathioprine, Ceftin [cefuroxime axetil], Cefuroxime, Ezetimibe, Insulin detemir, Lisinopril, Metformin hcl, Morphine sulfate, Pravastatin sodium, Sulfamethoxazole-trimethoprim, and Morphine   Assessment / Plan:     Visit Diagnoses: No diagnosis found.  Orders: No orders of the defined types were placed in this encounter.  No orders of the defined types were placed in this encounter.   Face-to-face time spent with patient was *** minutes. Greater than 50% of time was spent in counseling and coordination of care.  Follow-Up Instructions: No follow-ups on file.   Earnestine Mealing, CMA  Note - This record has been created using Editor, commissioning.  Chart creation errors  have been sought, but may not always  have been located. Such creation errors do not reflect on  the standard of medical care.

## 2021-12-06 ENCOUNTER — Other Ambulatory Visit: Payer: Self-pay | Admitting: Physician Assistant

## 2021-12-07 ENCOUNTER — Telehealth: Payer: Self-pay | Admitting: Internal Medicine

## 2021-12-07 HISTORY — PX: MOUTH SURGERY: SHX715

## 2021-12-07 NOTE — Telephone Encounter (Signed)
Faxed patient assistance app for [employer] to [employer] at [phone]

## 2021-12-08 DIAGNOSIS — M273 Alveolitis of jaws: Secondary | ICD-10-CM | POA: Diagnosis not present

## 2021-12-08 IMAGING — MR MR LUMBAR SPINE W/O CM
5 series · 31 of 48 positions shown · non-contrast
Comparison: MRI thoracic spine 12/09/2019 lumbar spine radiographs
04/09/2013. CT myelogram lumbar spine 01/09/2013

CLINICAL DATA: Low back pain with bilateral leg numbness. History
of lumbar fusion

EXAM:
MRI LUMBAR SPINE WITHOUT CONTRAST
TECHNIQUE: Multiplanar, multisequence MR imaging of the lumbar spine was
performed. No intravenous contrast was administered.

[Series 5: T2 · sagittal · 4.0mm · 0.68mm/px · 6 of 13 slices shown (1 of 2)]
[im 1/13]
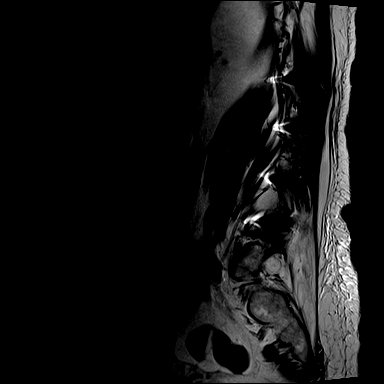
[im 3/13]
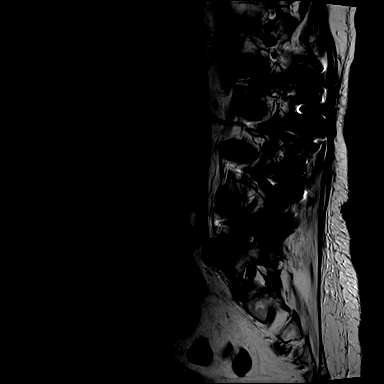
[im 5/13]
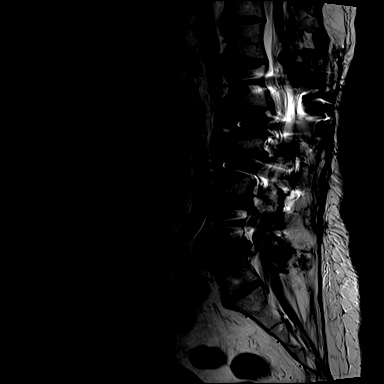
[im 8/13]
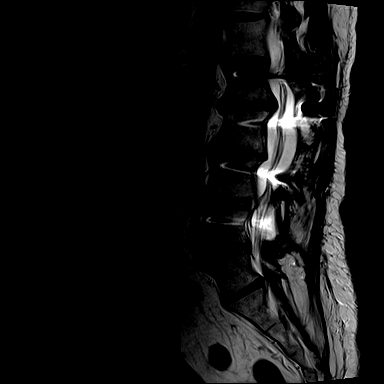
[im 10/13]
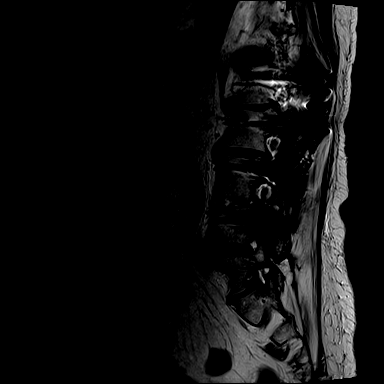
[im 13/13]
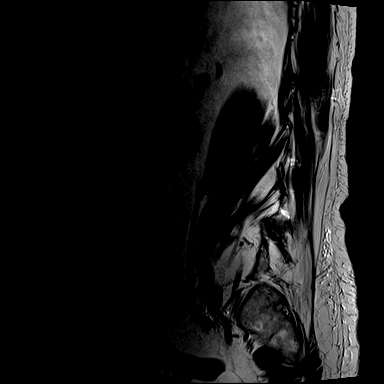

[Series 6: T1 · sagittal · 4.0mm · 0.81mm/px · 6 of 13 slices shown (1 of 2)]
[im 1/13]
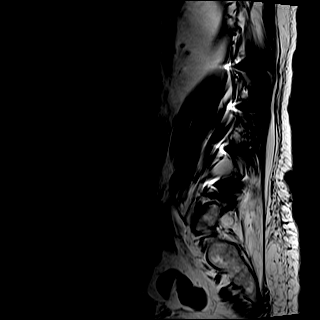
[im 3/13]
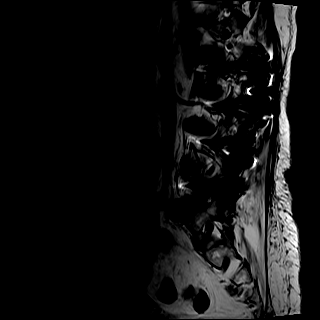
[im 5/13]
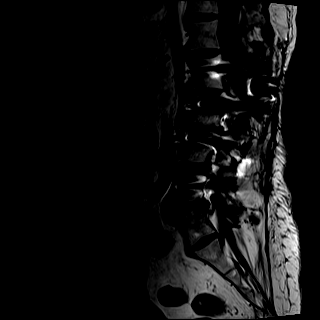
[im 8/13]
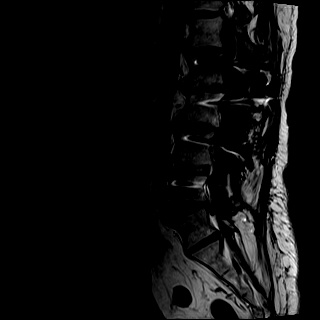
[im 10/13]
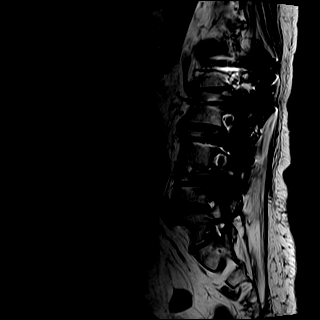
[im 13/13]
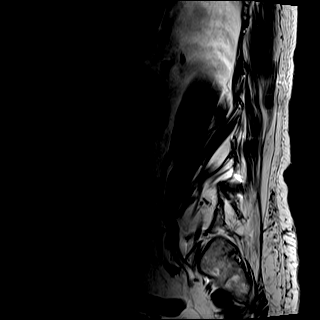

[Series 7: STIR · sagittal · 4.0mm · 0.51mm/px · 1 of 13 slices shown]
[im 1/13]
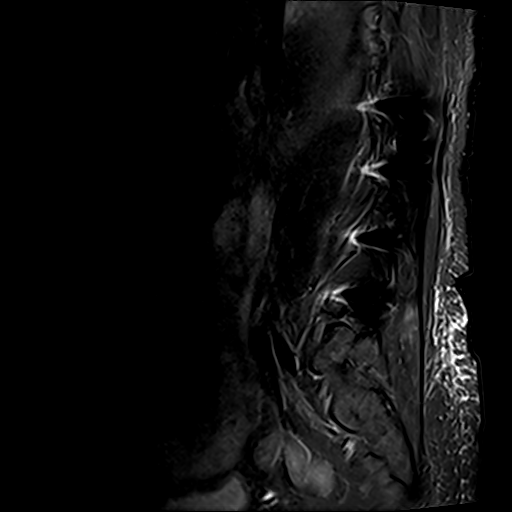

[Series 8: T2 · axial · 4.0mm · 0.70mm/px · z∈[-68,+85]mm · 9 of 30 slices shown (2 of 2)]
[im 1/30]
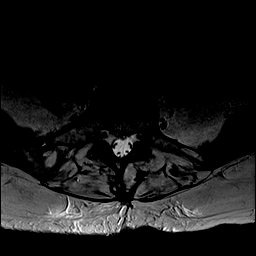
[im 5/30]
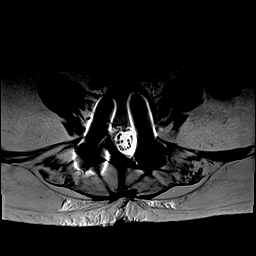
[im 9/30]
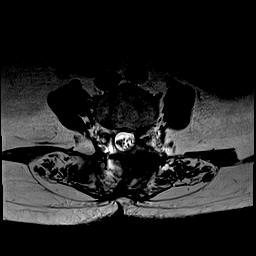
[im 13/30]
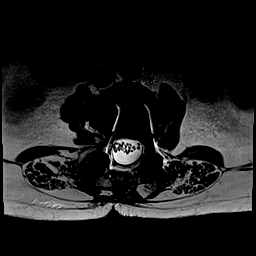
[im 15/30]
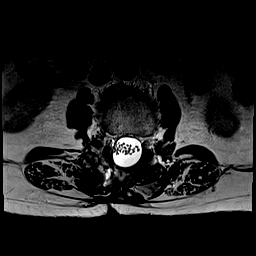
[im 17/30]
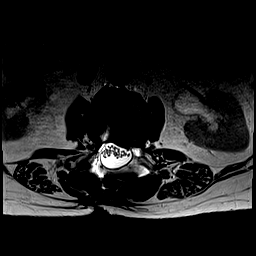
[im 21/30]
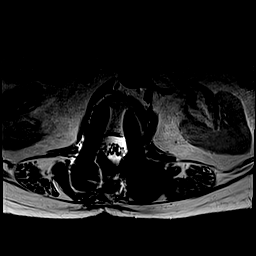
[im 25/30]
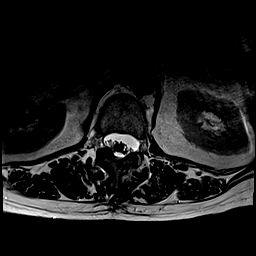
[im 30/30]
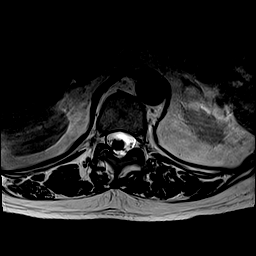

[Series 9: T1 · axial · 4.0mm · 0.35mm/px · z∈[-68,+85]mm · 9 of 30 slices shown (2 of 2)]
[im 1/30]
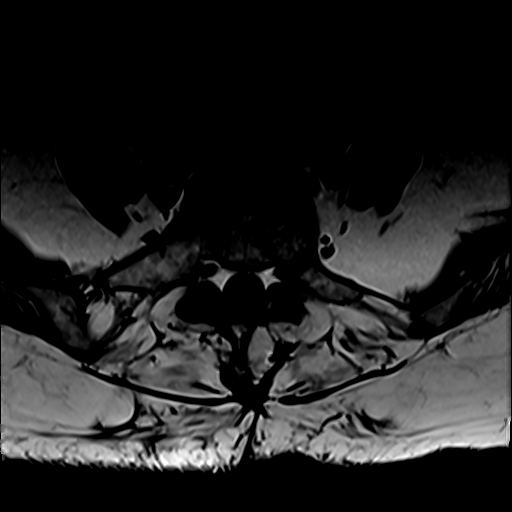
[im 5/30]
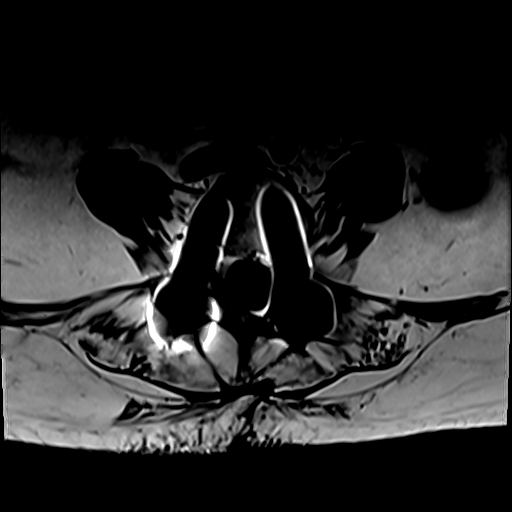
[im 9/30]
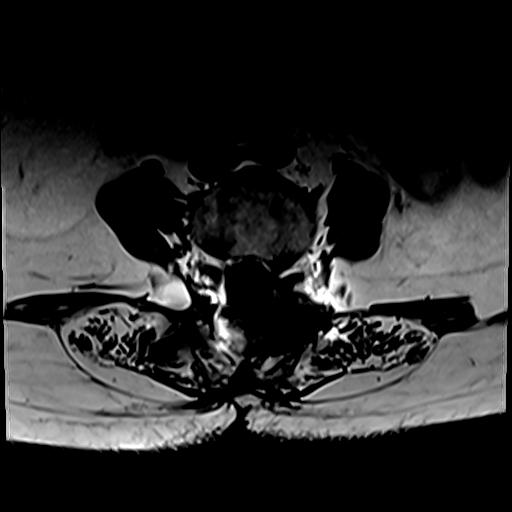
[im 13/30]
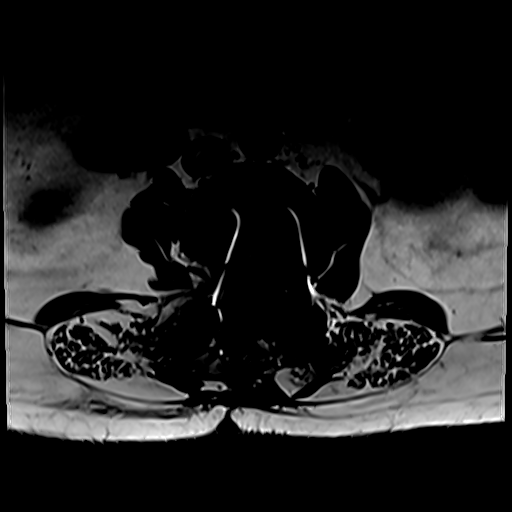
[im 15/30]
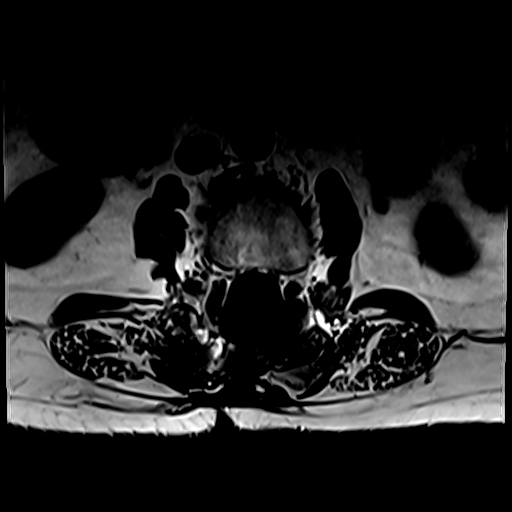
[im 17/30]
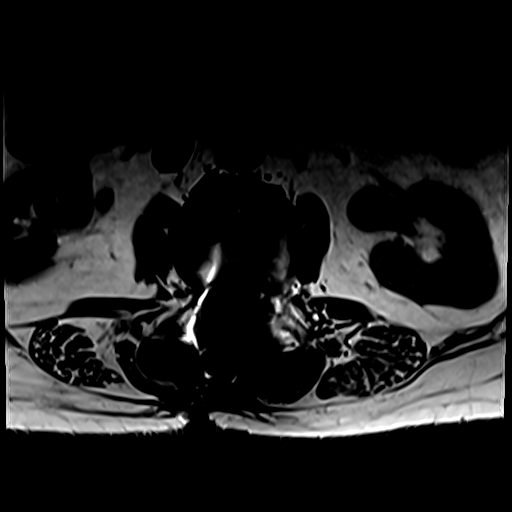
[im 21/30]
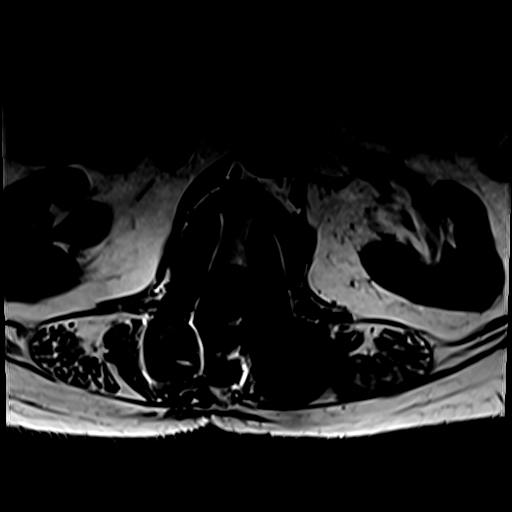
[im 25/30]
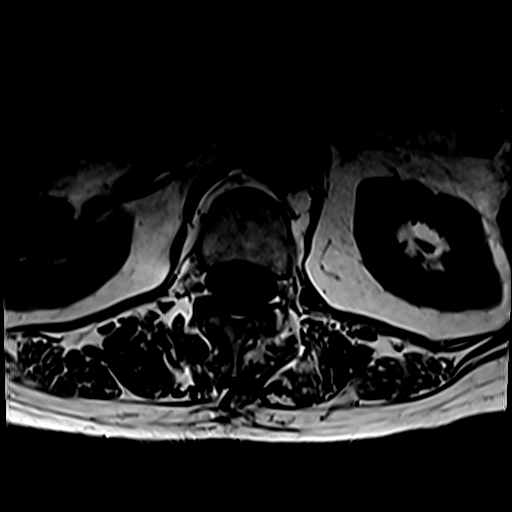
[im 30/30]
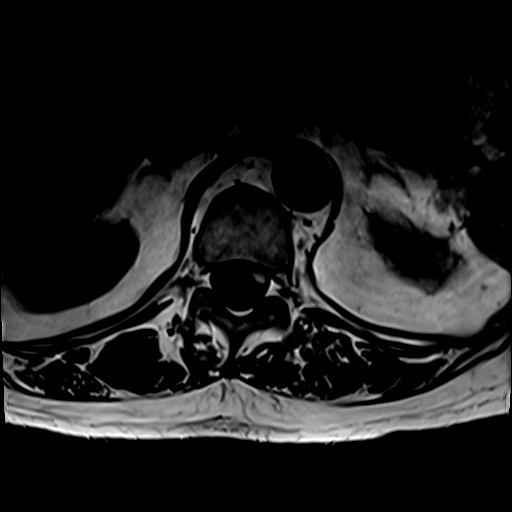

[31 of 48 positions shown; findings below may reference images not displayed]

FINDINGS: Segmentation: L5 is a transitional vertebra. L5 is partially
incorporated into the sacrum. Numbering consistent with the prior
myelogram.

Alignment:  Mild anterolisthesis L1-2.  Mild retrolisthesis L2-3.

Hardware: Pedicle screw fusion extending from L1 through L4.
Interbody spacer and solid fusion at L3-4. No spacer at L1-2 or
L2-3.

Vertebrae:  Negative for fracture, mass, or spinal infection.

Conus medullaris and cauda equina: Conus extends to the L1 level.
Conus and cauda equina appear normal.

Paraspinal and other soft tissues: Negative for paraspinous mass,
adenopathy, fluid collection.

Disc levels:

T12-L1 small central disc protrusion. Mild facet degeneration. No
significant stenosis

L1-2: Central and left-sided extruded disc fragment, improved from
the myelogram in 3901. No change from thoracic MRI 12/09/2019.
Posterior decompression. Negative for spinal stenosis.

L2-3: Disc degeneration with diffuse endplate spurring. Posterior
decompression without stenosis

L3-4: Pedicle screw and interbody fusion. Posterior decompression.
Negative for stenosis

L4-5: Disc degeneration with disc space narrowing and diffuse
endplate spurring. Moderate facet hypertrophy bilaterally right
greater than left. Moderate subarticular and foraminal stenosis on
the right due to spurring. Mild subarticular stenosis on the left

L5-S1: Small left foraminal disc protrusion with moderate left
foraminal stenosis. Mild flattening of the left L5 nerve root.
IMPRESSION: L5 is a transitional vertebra. Pedicle screw fusion extends from L1
through L4.

Small central disc protrusion T12-L1 without significant stenosis

Chronic central left-sided extruded disc fragment L1-2 unchanged.

Disc and facet degeneration L4-5 with moderate subarticular and
foraminal stenosis on the right

Small left foraminal disc protrusion L5-S1 with left L5 nerve root
impingement.

## 2021-12-14 ENCOUNTER — Ambulatory Visit (INDEPENDENT_AMBULATORY_CARE_PROVIDER_SITE_OTHER): Payer: PPO | Admitting: Gastroenterology

## 2021-12-14 ENCOUNTER — Encounter: Payer: Self-pay | Admitting: Gastroenterology

## 2021-12-14 VITALS — BP 163/80 | HR 101 | Temp 97.5°F | Ht 59.0 in | Wt 129.6 lb

## 2021-12-14 DIAGNOSIS — K219 Gastro-esophageal reflux disease without esophagitis: Secondary | ICD-10-CM | POA: Diagnosis not present

## 2021-12-14 MED ORDER — PANTOPRAZOLE SODIUM 40 MG PO TBEC: 40.0000 mg | DELAYED_RELEASE_TABLET | Freq: Two times a day (BID) | ORAL | 3 refills | Status: DC

## 2021-12-14 NOTE — Patient Instructions (Signed)
Let's stop omeprazole. Instead, take pantoprazole 30 minutes before breakfast and 30 minutes before dinner. It is best absorbed on an empty stomach.  As we talked about, let's raise that head of the bed up a bit more.  Continue to avoid late night eating.  Message me on MyChart in about 2 weeks with an update!  We will see you in 3 months regardless!  I enjoyed seeing you again today! As you know, I value our relationship and want to provide genuine, compassionate, and quality care. I welcome your feedback. If you receive a survey regarding your visit,  I greatly appreciate you taking time to fill this out. See you next time!  Annitta Needs, PhD, ANP-BC [facility]

## 2021-12-14 NOTE — Progress Notes (Signed)
Gastroenterology Office Note     Primary Care Physician:  [name], MD  Primary Gastroenterologist: Dr. Abbey Chatters   Chief Complaint   Chief Complaint  Patient presents with   Gastroesophageal Reflux    Waking up with fluid in her mouth. Has been happening every other night.      History of Present Illness   Tonya Hall is a 71 y.o. female presenting today in follow-up with a history of collagenous colitis, GERD, and gastritis.  Originally diagnosed with lymphocytic colitis on colonoscopy in 2012.  Last colonoscopy in 2018, normal terminal ileum, hemorrhoids, random colon biopsies consistent with collagenous colitis.  She has been on low-dose prednisone for years for psoriatic arthritis, unable to be weaned off due to adrenal insufficiency, followed by Dr. Soyla Murphy with endocrinology.  Barium pill esophagram in 2012 showed diffuse impairment of esophageal motility with incomplete clearance of barium by primary peristaltic waves.  Numerous secondary and tertiary waves noted.  Modified barium swallow study in July 2012 showed flash laryngeal penetration of liquid component with swallowing of pills, flash laryngeal penetration without aspiration or cough with thin barium by cup.    CT in Sept 2022 with hepatic steatosis and mild mucosal hyperemia, suggesting gastritis or PUD. Plans to follow unless symptomatic.    Waking up with acid and food in mouth. Not eating late at night. Sometimes may eat at 8 pm but goes to bed at midnight. No solid food dysphagia. Some pill dysphagia with larger pills. Has thoracic back pain that radiates around mid abdomen. Difficult to tell if true abdominal pain. Injection for back relieved pain for 3 weeks then went away. Used to have these symptoms a few times a year but in the last 3 months has happened every other night. Worsened when tired. Has placed HOB up about an inch on risers. Last week and a half not happened. Still going to raise HOB higher.    Omeprazole BID. No loss of appetite. Taking Ozempic. Symptoms may have gotten worse on Ozempic. Has missed a few doses due to being in donut hole. Wasn't on Ozempic when symptoms were flaring in the summer.   Past Medical History:  Diagnosis Date   Asthma    Albuterol in haler prn   Cataract    immature unsure which eye   Chronic back pain    COVID-19 October/November 2020   Degenerative disk disease    psoriatic   Diabetes mellitus without complication (HCC)    diet controlled   Esophageal motility disorder    Non-specific, see modified barium study/speech path, BP   Fibromyalgia    GERD (gastroesophageal reflux disease)    takes Protonix bid   Headache 2019   History of blood transfusion    post c-section   History of bronchitis    HTN (hypertension)    Hyperlipidemia    takes Pravastatin daily   Lymphocytic colitis 05/26/2010   Responded to Entocort x 3 MOS   Neuropathy    Nonallergic rhinitis    Osteoporosis    gets Boniva every 3 months   Peripheral edema    takes HCTZ daily   Pneumonia 02/2019   Psoriatic arthritis ([place])    Dr. Katherina Right   Psoriatic arthritis [facility])    bilateral hands   Raynaud's disease    Seasonal allergies    Shingles 2020   SUI (stress urinary incontinence, female)    Urinary urgency     Past Surgical History:  Procedure Laterality Date  ANTERIOR CERVICAL DECOMP/DISCECTOMY FUSION N/A 03/09/2020   Procedure: ANTERIOR CERVICAL DECOMPRESSION/DISCECTOMY FUSION, INTERBODY PROSTHESIS, PLATE SCREWS CERVICAL FIVE-SIX, CERVICAL SIX-SEVEN;  Surgeon: Newman Pies, MD;  Location: [facility];  Service: Neurosurgery;  Laterality: N/A;   BACK SURGERY  2003   BIOPSY  04/17/2016   Procedure: BIOPSY;  Surgeon: Danie Binder, MD;  [facility];  Service: Endoscopy;;  random colon bx's   CERVICAL SPINE SURGERY  09/09/2017   CESAREAN SECTION  1974, 1978       COLONOSCOPY  06/19/2008   WNU:[id number] internal hemorrhoids/mild sigmoin colon  diverticulosis   COLONOSCOPY  2012   Dr. Gala Romney: normal rectum, diverticula, lymphocytic colitis    COLONOSCOPY WITH PROPOFOL N/A 04/17/2016   Dr. Oneida Alar: Normal terminal ileum, internal/external hemorrhoids, random colon biopsies consistent with collagenous colitis   DILATION AND CURETTAGE OF UTERUS  1977   spontaneous abortion   ESOPHAGOGASTRODUODENOSCOPY  06/19/2008   GUY:[id number] gastritis   LUMBAR DISC ARTHROPLASTY  7/98   LUMBAR FUSION  6/03, 11/08, 11/14   L4-5 fusion, L2-3 fusion, L1-2   LUMBAR FUSION  08/03/2020   L4-L5   LUMBAR LAMINECTOMY  03/2020   ODONTOID SCREW INSERTION N/A 01/27/2018   Procedure: ODONTOID SCREW INSERTION;  Surgeon: Newman Pies, MD;  Location: [place];  Service: Neurosurgery;  Laterality: N/A;  ODONTOID SCREW INSERTION   TONSILLECTOMY     TONSILLECTOMY AND ADENOIDECTOMY     TUBAL LIGATION      Current Outpatient Medications  Medication Sig Dispense Refill   albuterol (PROVENTIL HFA;VENTOLIN HFA) 108 (90 BASE) MCG/ACT inhaler Inhale 2 puffs into the lungs every 6 (six) hours as needed for wheezing or shortness of breath.      Apremilast (OTEZLA) 30 MG TABS Take 1 tablet (30 mg total) by mouth 2 (two) times daily. 180 tablet 0   ascorbic acid (VITAMIN C) 500 MG tablet Take 500 mg by mouth 2 (two) times daily.     aspirin 81 MG tablet Take 81 mg by mouth daily.     azelastine (ASTELIN) 0.1 % nasal spray Place 1 spray into both nostrils 2 (two) times daily.     azelastine (OPTIVAR) 0.05 % ophthalmic solution Apply 1 drop to eye 2 (two) times daily.     Calcium Carb-Cholecalciferol (CALCIUM 600 + D PO) Take 1 tablet by mouth 2 (two) times daily.      Cetirizine HCl 10 MG CAPS Take by oral route.     denosumab (PROLIA) 60 MG/ML SOSY injection Inject 60 mg into the skin every 6 (six) months.     Evolocumab (REPATHA SURECLICK) 956 MG/ML SOAJ Inject 1 Dose into the skin every 14 (fourteen) days. 6 mL 3   fish oil-omega-3 fatty acids 1000 MG capsule Take 1 g by  mouth daily.     gabapentin (NEURONTIN) 300 MG capsule Take 600 mg by mouth See admin instructions. Take 600 mg 3 times daily, may take a 4th 300 mg dose as needed for pain     Ginger, Zingiber officinalis, (GINGER ROOT) 500 MG CAPS Take 500 mg by mouth daily.     Glucosamine Sulfate 1000 MG CAPS Take 2 capsules (2,000 mg total) by mouth daily.     HUMULIN N KWIKPEN 100 UNIT/ML Kiwkpen Inject 10 Units into the skin at bedtime.     hydrocortisone sodium succinate (SOLU-CORTEF) 100 MG SOLR injection Inject 100 mg into the muscle daily as needed (when unable to keep down prednisone tablet).      hydrOXYzine (ATARAX)  25 MG tablet Take 0.5-1 tablets (12.5-25 mg total) by mouth every 8 (eight) hours as needed for itching. 30 tablet 0   insulin isophane & regular human KwikPen (HUMULIN 70/30 KWIKPEN) (70-30) 100 UNIT/ML KwikPen Inject into the skin.     ipratropium (ATROVENT) 0.06 % nasal spray Place 2 sprays into both nostrils 3 (three) times daily.     Lancets (ONETOUCH DELICA PLUS [id number]) MISC USE TO TEST 3 TIMESUDAILY.     leflunomide (ARAVA) 20 MG tablet TAKE 1 TABLET BY MOUTH ONCE DAILY. 90 tablet 0   losartan (COZAAR) 100 MG tablet Take by mouth.     metoprolol succinate (TOPROL-XL) 50 MG 24 hr tablet Take 1 tablet (50 mg total) by mouth daily. Take with or immediately following a meal.     Misc Natural Products (TART CHERRY ADVANCED PO) Take 1 tablet by mouth daily. daily     Multiple Vitamin (MULTIVITAMIN) tablet Take 1 tablet by mouth daily.     nortriptyline (PAMELOR) 25 MG capsule TAKE (1) CAPSULE BY MOUTH AT BEDTIME. (Patient taking differently: Take 25 mg by mouth at bedtime.) 30 capsule 0   Omeprazole 20 MG TBEC      ONETOUCH VERIO test strip      oxybutynin (DITROPAN-XL) 5 MG 24 hr tablet Take 1 tablet (5 mg total) by mouth at bedtime. 90 tablet 0   OZEMPIC, 0.25 OR 0.5 MG/DOSE, 2 MG/3ML SOPN SMARTSIG:0.25 Milligram(s) SUB-Q Once a Week     Polyethyl Glycol-Propyl Glycol (SYSTANE OP)  Apply to eye 4 (four) times daily.     potassium chloride SA (K-DUR,KLOR-CON) 20 MEQ tablet Take 20 mEq by mouth daily.      predniSONE (DELTASONE) 5 MG tablet TAKE 1 TABLET BY MOUTH DAILY WITH BREAKFAST 90 tablet 0   RUTIN PO Take 350 mg by mouth 3 (three) times daily.     spironolactone (ALDACTONE) 25 MG tablet Take 25 mg by mouth daily.     SURE COMFORT PEN NEEDLES 31G X 5 MM MISC      TALTZ 80 MG/ML SOAJ INJECT '80MG'$  (1 PEN) UNDER THE SKIN EVERY 4 WEEKS 4 mL 0   topiramate (TOPAMAX) 200 MG tablet TAKE (1) TABLET BY MOUTH AT BEDTIME. 90 tablet 0   traMADol (ULTRAM) 50 MG tablet Take 50 mg by mouth every 8 (eight) hours as needed.     Turmeric 500 MG CAPS Take 500 mg by mouth every morning.     No current facility-administered medications for this visit.    Allergies as of 12/14/2021 - Review Complete 12/14/2021  Allergen Reaction Noted   Adalimumab Rash and Other (See Comments) 05/09/2021   Imuran [azathioprine sodium] Rash 07/03/2011   Methotrexate Other (See Comments) 05/09/2021   Plaquenil [hydroxychloroquine sulfate] Rash 07/03/2011   Sulfonamide derivatives Rash    Azathioprine  05/09/2021   Ceftin [cefuroxime axetil] Nausea Only 07/06/2014   Cefuroxime  05/09/2021   Ezetimibe  09/22/2021   Insulin detemir  09/22/2021   Lisinopril  09/22/2021   Metformin hcl  09/22/2021   Morphine sulfate  05/09/2021   Pravastatin sodium  09/22/2021   Sulfamethoxazole-trimethoprim  09/22/2021   Morphine Nausea And Vomiting     Family History  Problem Relation Age of Onset   Diabetes Mother    Pancreatitis Mother    Arthritis Mother        psoriatic    Fibromyalgia Mother    Rheumatic fever Father    Diabetes Other  grandparents   Skin cancer Other        grandfather   Hypertension Other        grandparent   Congestive Heart Failure Other        grandfather   Parkinson's disease Other        grandmother   Colon cancer Maternal Uncle    Fibromyalgia Sister    Rheum  arthritis Sister    Diabetes Sister    Fibromyalgia Sister    Diabetes Sister    Rheum arthritis Sister    Hypertension Son    Colon polyps Neg Hx     Social History   Socioeconomic History   Marital status: Married    Spouse name: Not on file   Number of children: Not on file   Years of education: Not on file   Highest education level: Not on file  Occupational History   Occupation: Case Dietitian    Employer: [employer]    Comment: Engineer, technical sales  Tobacco Use   Smoking status: Never    Passive exposure: Never   Smokeless tobacco: Never  Vaping Use   Vaping Use: Never used  Substance and Sexual Activity   Alcohol use: No   Drug use: No   Sexual activity: Not Currently    Partners: Male    Birth control/protection: Surgical    Comment: BTL  Other Topics Concern   Not on file  Social History Narrative   ATTENDS [employer]. 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   Social Determinants of Health   Financial Resource Strain: Not on file  Food Insecurity: Not on file  Transportation Needs: Not on file  Physical Activity: Not on file  Stress: Not on file  Social Connections: Not on file  Intimate Partner Violence: Not on file     Review of Systems   Gen: Denies any fever, chills, fatigue, weight loss, lack of appetite.  CV: Denies chest pain, heart palpitations, peripheral edema, syncope.  Resp: Denies shortness of breath at rest or with exertion. Denies wheezing or cough.  GI: see HPI GU : Denies urinary burning, urinary frequency, urinary hesitancy MS: Denies joint pain, muscle weakness, cramps, or limitation of movement.  Derm: Denies rash, itching, dry skin Psych: Denies depression, anxiety, memory loss, and confusion Heme: Denies bruising, bleeding, and enlarged lymph nodes.   Physical Exam   BP (!) 163/80 (BP Location: Left Arm, Patient Position: Sitting, Cuff Size: Normal)   Pulse (!) 101   Temp (!) 97.5 F (36.4 C) (Temporal)   Ht '4\' 11"'$  (1.499 m)    Wt 129 lb 9.6 oz (58.8 kg)   LMP 02/01/1999   SpO2 98%   BMI 26.18 kg/m  General:   Alert and oriented. Pleasant and cooperative. Well-nourished and well-developed.  Head:  Normocephalic and atraumatic. Eyes:  Without icterus Abdomen:  +BS, soft, non-tender and non-distended. No HSM noted. No guarding or rebound. No masses appreciated.  Rectal:  Deferred  Msk:  Symmetrical without gross deformities. Normal posture. Extremities:  Without edema. Neurologic:  Alert and  oriented x4;  grossly normal neurologically. Skin:  Intact without significant lesions or rashes. Psych:  Alert and cooperative. Normal mood and affect.   Assessment   Tonya Hall is a 71 y.o. female presenting today in follow-up with a history of collagenous colitis, GERD, and gastritis.  Now presenting with regurgitation in evenings.  I note she is on Ozempic, but symptoms started prior to taking this. Suspect delayed gastric emptying  likely contributing due to med effect. I also note she is taking her second PPI dose right at bedtime, which may not be absorbed effectively if not an empty stomach.  We will change to pantoprazole BID before meals, raise HOB on risers, and have her give an update in 2 weeks. As no solid food dysphagia or abdominal pain, holding on EGD. However, there was mention of possible gastritis, unable to exclude PUD on CT last year with plans to follow clinically. Low threshold for EGD if no improvement.     PLAN    Stop omeprazole Start pantoprazole BID, 30 minutes before meals Raise HOB Continue to avoid late night eating Return in 3 months Progress report in 2 weeks   Annitta Needs, PhD, ANP-BC [facility]

## 2021-12-15 ENCOUNTER — Ambulatory Visit: Payer: PPO | Admitting: Physician Assistant

## 2021-12-15 DIAGNOSIS — G894 Chronic pain syndrome: Secondary | ICD-10-CM

## 2021-12-15 DIAGNOSIS — L4050 Arthropathic psoriasis, unspecified: Secondary | ICD-10-CM

## 2021-12-15 DIAGNOSIS — Z8639 Personal history of other endocrine, nutritional and metabolic disease: Secondary | ICD-10-CM

## 2021-12-15 DIAGNOSIS — M5136 Other intervertebral disc degeneration, lumbar region: Secondary | ICD-10-CM

## 2021-12-15 DIAGNOSIS — I7300 Raynaud's syndrome without gangrene: Secondary | ICD-10-CM

## 2021-12-15 DIAGNOSIS — Z79899 Other long term (current) drug therapy: Secondary | ICD-10-CM

## 2021-12-15 DIAGNOSIS — L409 Psoriasis, unspecified: Secondary | ICD-10-CM

## 2021-12-15 DIAGNOSIS — K224 Dyskinesia of esophagus: Secondary | ICD-10-CM

## 2021-12-15 DIAGNOSIS — M170 Bilateral primary osteoarthritis of knee: Secondary | ICD-10-CM

## 2021-12-15 DIAGNOSIS — M5030 Other cervical disc degeneration, unspecified cervical region: Secondary | ICD-10-CM

## 2021-12-15 DIAGNOSIS — M810 Age-related osteoporosis without current pathological fracture: Secondary | ICD-10-CM

## 2021-12-15 DIAGNOSIS — K52831 Collagenous colitis: Secondary | ICD-10-CM

## 2021-12-15 DIAGNOSIS — E2740 Unspecified adrenocortical insufficiency: Secondary | ICD-10-CM

## 2021-12-15 DIAGNOSIS — Z8719 Personal history of other diseases of the digestive system: Secondary | ICD-10-CM

## 2021-12-15 DIAGNOSIS — M7061 Trochanteric bursitis, right hip: Secondary | ICD-10-CM

## 2021-12-15 DIAGNOSIS — G8929 Other chronic pain: Secondary | ICD-10-CM

## 2021-12-15 DIAGNOSIS — I10 Essential (primary) hypertension: Secondary | ICD-10-CM

## 2021-12-15 DIAGNOSIS — M5134 Other intervertebral disc degeneration, thoracic region: Secondary | ICD-10-CM

## 2021-12-15 NOTE — Progress Notes (Unsigned)
Office Visit Note  Patient: Tonya Hall             Date of Birth: 1951/01/04           MRN: [id number]             PCP: [name], MD Referring: [name], MD Visit Date: 12/18/2021 Occupation: '@GUAROCC'$ @  Subjective:  Pain in multiples   History of Present Illness: Tonya Hall is a 71 y.o. female with history of psoriatic arthritis, osteoarthritis, and osteoporosis. She is prescribed Taltz 80 mg sq injections q 4 weeks, Arava '20mg'$  1 tablet daily, Otezla 30 mg 1 tablet by mouth twice daily, and prednisone 5 mg 1 tablet daily.  Patient reports that she has been holding these medications since last Wednesday due to being scheduled for a blepharoplasty this coming Wednesday.  She continues to have chronic pain in multiple joints.  She had a left SI joint cortisone injection on 11/01/2021 which provided relief for about 3 weeks.  She continues to have ongoing discomfort in the left trochanteric bursa and left SI joint today.  She has occasional discomfort in her right shoulder but most of her discomfort has been in the thoracic region as well as both hands.  She has also been experiencing increased pain and stiffness in both knee joints.  She would like to reapply for Visco gel injections for both knees which alleviated her symptoms in the past.   She remains on Prolia injections every 6 months as prescribed by Dr. Chalmers Cater.  She denies any recent infections.    Activities of Daily Living:  Patient reports morning stiffness for all day. Patient Reports nocturnal pain.  Difficulty dressing/grooming: Reports Difficulty climbing stairs: Reports Difficulty getting out of chair: Reports Difficulty using hands for taps, buttons, cutlery, and/or writing: Reports  Review of Systems  Constitutional:  Positive for fatigue.  HENT:  Positive for mouth dryness. Negative for mouth sores.   Eyes:  Positive for dryness.  Respiratory:  Negative for shortness of breath.   Cardiovascular:  Negative  for chest pain and palpitations.  Gastrointestinal:  Negative for blood in stool, constipation and diarrhea.  Endocrine: Negative for increased urination.  Genitourinary:  Negative for involuntary urination.  Musculoskeletal:  Positive for joint pain, gait problem, joint pain, joint swelling and morning stiffness. Negative for myalgias, muscle weakness, muscle tenderness and myalgias.  Skin:  Positive for color change and rash. Negative for hair loss and sensitivity to sunlight.  Allergic/Immunologic: Negative for susceptible to infections.  Neurological:  Negative for dizziness and headaches.  Hematological:  Negative for swollen glands.  Psychiatric/Behavioral:  Negative for depressed mood and sleep disturbance. The patient is not nervous/anxious.     PMFS History:  Patient Active Problem List   Diagnosis Date Noted  . Statin not tolerated 10/23/2021  . Chronic allergic conjunctivitis 07/29/2021  . Vasomotor rhinitis 07/29/2021  . Benign neoplasm of skin of lower limb 05/17/2021  . History of neoplasm 05/17/2021  . Melanocytic nevi of left upper limb, including shoulder 05/17/2021  . Melanocytic nevi of trunk 05/17/2021  . Other seborrheic keratosis 05/17/2021  . Diarrhea 03/10/2021  . Fracture of pelvis ([place]) 01/27/2021  . Pain of left hip joint 01/06/2021  . Foraminal stenosis of lumbar region 08/03/2020  . Cervical spondylosis with myelopathy and radiculopathy 03/09/2020  . AKI (acute kidney injury) ([place]) 02/07/2019  . Acute respiratory failure with hypoxia ([place]) 02/06/2019  . Acute respiratory disease due to COVID-19 virus 02/06/2019  .  Community acquired pneumonia 02/06/2019  . Hyponatremia 02/06/2019  . Asthma   . Diabetes mellitus without complication ([place])   . Closed nondisplaced odontoid fracture with type II morphology ([place]) 01/27/2018  . Odontoid fracture ([place]) 01/24/2018  . Chest pain 11/14/2017  . Transaminitis 06/22/2016  . Collagenous colitis 05/23/2016  .  Psoriasis 05/21/2016  . High risk medication use 02/15/2016  . Age-related osteoporosis without current pathological fracture 02/15/2016  . Trochanteric bursitis, left hip 02/15/2016  . Sacroiliitis, not elsewhere classified ([place]) 02/15/2016  . Chronic pain syndrome 02/15/2016  . Abnormal weight gain 06/25/2012  . Hypertension 07/03/2011  . Hypercholesteremia 07/03/2011  . Psoriatic arthritis ([place]) 07/03/2011  . Osteoarthritis of multiple joints 07/03/2011  . Esophageal motility disorder 02/14/2011  . Cough 02/14/2011  . GERD 05/05/2010    Past Medical History:  Diagnosis Date  . Asthma    Albuterol in haler prn  . Cataract    immature unsure which eye  . Chronic back pain   . COVID-19 October/November 2020  . Degenerative disk disease    psoriatic  . Diabetes mellitus without complication (HCC)    diet controlled  . Esophageal motility disorder    Non-specific, see modified barium study/speech path, BP  . Fibromyalgia   . GERD (gastroesophageal reflux disease)    takes Protonix bid  . Headache 2019  . History of blood transfusion    post c-section  . History of bronchitis   . HTN (hypertension)   . Hyperlipidemia    takes Pravastatin daily  . Lymphocytic colitis 05/26/2010   Responded to Entocort x 3 MOS  . Neuropathy   . Nonallergic rhinitis   . Osteoporosis    gets Boniva every 3 months  . Peripheral edema    takes HCTZ daily  . Pneumonia 02/2019  . Psoriatic arthritis ([place])    Dr. Katherina Right  . Psoriatic arthritis ([place])    bilateral hands  . Raynaud's disease   . Seasonal allergies   . Shingles 2020  . SUI (stress urinary incontinence, female)   . Urinary urgency     Family History  Problem Relation Age of Onset  . Diabetes Mother   . Pancreatitis Mother   . Arthritis Mother        psoriatic   . Fibromyalgia Mother   . Rheumatic fever Father   . Diabetes Other        grandparents  . Skin cancer Other        grandfather  . Hypertension Other         grandparent  . Congestive Heart Failure Other        grandfather  . Parkinson's disease Other        grandmother  . Colon cancer Maternal Uncle   . Fibromyalgia Sister   . Rheum arthritis Sister   . Diabetes Sister   . Fibromyalgia Sister   . Diabetes Sister   . Rheum arthritis Sister   . Hypertension Son   . Colon polyps Neg Hx    Past Surgical History:  Procedure Laterality Date  . ANTERIOR CERVICAL DECOMP/DISCECTOMY FUSION N/A 03/09/2020   Procedure: ANTERIOR CERVICAL DECOMPRESSION/DISCECTOMY FUSION, INTERBODY PROSTHESIS, PLATE SCREWS CERVICAL FIVE-SIX, CERVICAL SIX-SEVEN;  Surgeon: Newman Pies, MD;  Location: [facility];  Service: Neurosurgery;  Laterality: N/A;  . BACK SURGERY  2003  . BIOPSY  04/17/2016   Procedure: BIOPSY;  Surgeon: Danie Binder, MD;  [facility];  Service: Endoscopy;;  random colon bx's  . CERVICAL  SPINE SURGERY  09/09/2017  . CESAREAN SECTION  1974, 1978      . COLONOSCOPY  06/19/2008   IOX:BDZHG internal hemorrhoids/mild sigmoin colon diverticulosis  . COLONOSCOPY  2012   Dr. Gala Romney: normal rectum, diverticula, lymphocytic colitis   . COLONOSCOPY WITH PROPOFOL N/A 04/17/2016   Dr. Oneida Alar: Normal terminal ileum, internal/external hemorrhoids, random colon biopsies consistent with collagenous colitis  . DILATION AND CURETTAGE OF UTERUS  1977   spontaneous abortion  . ESOPHAGOGASTRODUODENOSCOPY  06/19/2008   DJM:EQASTMHD gastritis  . LUMBAR DISC ARTHROPLASTY  09/1996  . LUMBAR FUSION  6/03, 11/08, 11/14   L4-5 fusion, L2-3 fusion, L1-2  . LUMBAR FUSION  08/03/2020   L4-L5  . LUMBAR LAMINECTOMY  03/2020  . MOUTH SURGERY  12/07/2021   oral surgery  . ODONTOID SCREW INSERTION N/A 01/27/2018   Procedure: ODONTOID SCREW INSERTION;  Surgeon: Newman Pies, MD;  Location: [facility];  Service: Neurosurgery;  Laterality: N/A;  ODONTOID SCREW INSERTION  . TONSILLECTOMY    . TONSILLECTOMY AND ADENOIDECTOMY    . TUBAL LIGATION     Social  History   Social History Narrative   ATTENDS [employer]. 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   Immunization History  Administered Date(s) Administered  . Influenza,inj,Quad PF,6+ Mos 01/21/2017  . Influenza-Unspecified 01/30/2017  . Moderna Sars-Covid-2 Vaccination 06/01/2019, 07/02/2019  . Pneumococcal Polysaccharide-23 02/19/2013  . Tdap 09/10/2016     Objective: Vital Signs: BP (!) 169/71 (BP Location: Left Arm, Patient Position: Sitting, Cuff Size: Normal)   Pulse 89   Resp 16   Ht '4\' 11"'$  (1.499 m)   Wt 129 lb (58.5 kg)   LMP 02/01/1999   BMI 26.05 kg/m    Physical Exam Vitals and nursing note reviewed.  Constitutional:      Appearance: She is well-developed.  HENT:     Head: Normocephalic and atraumatic.  Eyes:     Conjunctiva/sclera: Conjunctivae normal.  Cardiovascular:     Rate and Rhythm: Normal rate and regular rhythm.     Heart sounds: Normal heart sounds.  Pulmonary:     Effort: Pulmonary effort is normal.     Breath sounds: Normal breath sounds.  Abdominal:     General: Bowel sounds are normal.     Palpations: Abdomen is soft.  Musculoskeletal:     Cervical back: Normal range of motion.  Lymphadenopathy:     Cervical: No cervical adenopathy.  Skin:    General: Skin is warm and dry.     Capillary Refill: Capillary refill takes less than 2 seconds.  Neurological:     Mental Status: She is alert and oriented to person, place, and time.  Psychiatric:        Behavior: Behavior normal.     Musculoskeletal Exam: C-spine limited ROM.  Thoracic kyphosis.  Trapezius muscle tension and tenderness bilaterally.  Tenderness over the left SI joint.  Tenderness over the left trochanteric bursa.  Shoulder joints have good ROM with some discomfort in the right shoulder.  Elbow joints have good range of motion with no tenderness or inflammation.  Bilateral CMC, PIP, DIP thickening with incomplete fist formation.  No synovitis or dactylitis noted.  Severe PIP and  DIP thickening.  Limited range of motion of both hip joints with tenderness over the left trochanteric bursa.  Knee joints have good range of motion with some crepitus but no warmth or effusion.  Ankle joints have good range of motion with no tenderness or joint swelling.  No evidence of Achilles  tendinitis or planter fasciitis.    CDAI Exam: CDAI Score: -- Patient Global: --; Provider Global: -- Swollen: --; Tender: -- Joint Exam 12/18/2021   No joint exam has been documented for this visit   There is currently no information documented on the homunculus. Go to the Rheumatology activity and complete the homunculus joint exam.  Investigation: No additional findings.  Imaging: No results found.  Recent Labs: Lab Results  Component Value Date   WBC 11.9 (H) 09/20/2021   HGB 13.2 09/20/2021   PLT 264 09/20/2021   NA 139 09/20/2021   K 3.9 09/20/2021   CL 106 09/20/2021   CO2 23 09/20/2021   GLUCOSE 114 (H) 09/20/2021   BUN 22 09/20/2021   CREATININE 0.98 09/20/2021   BILITOT 0.3 09/20/2021   ALKPHOS 70 12/21/2020   AST 22 09/20/2021   ALT 23 09/20/2021   PROT 6.3 09/20/2021   ALBUMIN 4.4 12/21/2020   CALCIUM 9.6 09/20/2021   GFRAA 65 06/08/2020   QFTBGOLD Negative 05/05/2015   QFTBGOLDPLUS NEGATIVE 09/20/2021    Speciality Comments: MTX- high LFTs, PLQ,SSZ,Imuran, Humira- allergy, Enbrel , Remicade - inadequate response  Procedures:  No procedures performed Allergies: Adalimumab, Imuran [azathioprine sodium], Methotrexate, Plaquenil [hydroxychloroquine sulfate], Sulfonamide derivatives, Azathioprine, Ceftin [cefuroxime axetil], Cefuroxime, Ezetimibe, Insulin detemir, Lisinopril, Metformin hcl, Morphine sulfate, Pravastatin sodium, Sulfamethoxazole-trimethoprim, and Morphine   Assessment / Plan:     Visit Diagnoses: Psoriatic arthritis ([place]): She has no synovitis or dactylitis on examination today.  She continues to have chronic pain involving multiple joints.  Her pain  has been most severe in the thoracic region as well as in both hands.  She remains under the care of Dr. Arnoldo Morale for thoracic and lumbar spinal pain. She has chronic, severe CMC, PIP, and DIP thickening in both hands but no active active inflammation currently.  No achilles tendonitis or plantar fascitis.  She chronic left SI joint pain and had a left SI joint cortisone injection on 11/01/21 which only provided relief for 3 weeks.  She is prescribed Taltz 80 mg sq injections every 4 weeks, Arava 20 mg 1 tablet by mouth daily, Otezla 30 mg 1 tablet twice daily, and prednisone 5 mg 1 tablet daily.  Overall her inflammation appears to be well controlled on the current treatment regimen so no medications will be changed at this time.  She was advised to notify us if she develops any new or worsening symptoms.  She will follow-up in the office in 3 months or sooner if needed.  Psoriasis: She has no active psoriasis at this time.  High risk medication use - Taltz 80 mg sq injections q 4 weeks, Arava '20mg'$  1 tablet daily, Otezla 30 mg 1 tablet by mouth twice daily, and prednisone 5 mg 1 tablet daily.  - Plan: CBC with Differential/Platelet, COMPLETE METABOLIC PANEL WITH GFR CBC and CMP updated on 09/20/21.  Orders for CBC and CMP were released today.  Her next lab work will be due in December and every 3 months to monitor for drug toxicity. TB gold negative on 09/20/21.  Discussed the importance of holding taltz and arava if she develops signs or symptoms of an infection and to resume once the infection has completely cleared. Scheduled for a blepharoplasty on Wednesday.  Currently holding immunosuppressive agents.    Chronic left SI joint pain: Chronic pain.  She has tenderness over the left SI joint on examination today.  She had a left SI joint cortisone injection performed on 11/01/2021 which  required a relief for only about 3 weeks.  Chronic, persistent pain.   Trochanteric bursitis of both hips: She has chronic  pain due to trochanteric bursitis of the left hip.  Different treatment options were discussed today in detail.  Patient will notify us if she would like a referral to physical therapy placed.  She may benefit from trying dry needling.  She is also considering proceeding with surgical excision of the bursa in the future if her symptoms persist or worsen.  Primary osteoarthritis of both knees -She presents today with increased discomfort and stiffness in both knee joints.  On examination today she has painful range of motion of both knees but no warmth or effusion was noted.  Mild knee crepitus noted bilaterally.  X-rays of both knees were obtained today for further evaluation.  She previously had a good response to Visco gel injections.  We will try reapplying for Visco gel injections for both knees.  She plans on continuing to use a cane to assist with ambulation. Plan: XR KNEE 3 VIEW RIGHT, XR KNEE 3 VIEW LEFT  This patient is diagnosed with osteoarthritis of the knee(s).    Radiographs show evidence of joint space narrowing, osteophytes, subchondral sclerosis and/or subchondral cysts.  This patient has knee pain which interferes with functional and activities of daily living.    This patient has experienced inadequate response, adverse effects and/or intolerance with conservative treatments such as acetaminophen, NSAIDS, topical creams, physical therapy or regular exercise, knee bracing and/or weight loss.   This patient has experienced inadequate response or has a contraindication to intra articular steroid injections for at least 3 months.   This patient is not scheduled to have a total knee replacement within 6 months of starting treatment with viscosupplementation.  DDD (degenerative disc disease), cervical: C-spine has limited range of motion.  DDD (degenerative disc disease), thoracic: Chronic pain.  Followed by Dr. Arnoldo Morale.  Thoracic spine degenerative changes similar to 2021 noted on MRI  from 09/19/2021.  DDD (degenerative disc disease), lumbar: Chronic pain.  Raynaud's syndrome without gangrene: Not currently active.  No digital ulcerations or signs of gangrene were noted.  Age-related osteoporosis without current pathological fracture: Followed by Dr. Chalmers Cater.  She remains on Prolia 60 mg sq injections every 6 months.  She is taking calcium vitamin D supplement daily.  No recent falls or fractures.  Chronic pain syndrome: She continues to have chronic pain involving multiple joints.  Other medical conditions are listed as follows:  Essential hypertension: Blood pressure is 169/71 today in the office.  Adrenal insufficiency (HCC)  History of type 2 diabetes mellitus  History of gastroesophageal reflux (GERD)  Esophageal motility disorder  Collagenous colitis  Senile purpura ([place])  Orders: Orders Placed This Encounter  Procedures  . XR KNEE 3 VIEW RIGHT  . XR KNEE 3 VIEW LEFT  . CBC with Differential/Platelet  . COMPLETE METABOLIC PANEL WITH GFR   No orders of the defined types were placed in this encounter.   Follow-Up Instructions: Return in about 3 months (around 03/19/2022) for Psoriatic arthritis, Osteoarthritis, Osteoporosis.   Ofilia Neas, PA-C  Note - This record has been created using Dragon software.  Chart creation errors have been sought, but may not always  have been located. Such creation errors do not reflect on  the standard of medical care.

## 2021-12-18 ENCOUNTER — Ambulatory Visit (INDEPENDENT_AMBULATORY_CARE_PROVIDER_SITE_OTHER): Payer: PPO

## 2021-12-18 ENCOUNTER — Ambulatory Visit: Payer: PPO | Attending: Physician Assistant | Admitting: Physician Assistant

## 2021-12-18 ENCOUNTER — Encounter: Payer: Self-pay | Admitting: Physician Assistant

## 2021-12-18 VITALS — BP 169/71 | HR 89 | Resp 16 | Ht 59.0 in | Wt 129.0 lb

## 2021-12-18 DIAGNOSIS — M5134 Other intervertebral disc degeneration, thoracic region: Secondary | ICD-10-CM | POA: Diagnosis not present

## 2021-12-18 DIAGNOSIS — K224 Dyskinesia of esophagus: Secondary | ICD-10-CM

## 2021-12-18 DIAGNOSIS — M170 Bilateral primary osteoarthritis of knee: Secondary | ICD-10-CM | POA: Diagnosis not present

## 2021-12-18 DIAGNOSIS — M7061 Trochanteric bursitis, right hip: Secondary | ICD-10-CM

## 2021-12-18 DIAGNOSIS — M5136 Other intervertebral disc degeneration, lumbar region: Secondary | ICD-10-CM | POA: Diagnosis not present

## 2021-12-18 DIAGNOSIS — G894 Chronic pain syndrome: Secondary | ICD-10-CM | POA: Diagnosis not present

## 2021-12-18 DIAGNOSIS — E2740 Unspecified adrenocortical insufficiency: Secondary | ICD-10-CM

## 2021-12-18 DIAGNOSIS — M1711 Unilateral primary osteoarthritis, right knee: Secondary | ICD-10-CM | POA: Diagnosis not present

## 2021-12-18 DIAGNOSIS — M810 Age-related osteoporosis without current pathological fracture: Secondary | ICD-10-CM

## 2021-12-18 DIAGNOSIS — Z8639 Personal history of other endocrine, nutritional and metabolic disease: Secondary | ICD-10-CM

## 2021-12-18 DIAGNOSIS — I10 Essential (primary) hypertension: Secondary | ICD-10-CM

## 2021-12-18 DIAGNOSIS — M5030 Other cervical disc degeneration, unspecified cervical region: Secondary | ICD-10-CM

## 2021-12-18 DIAGNOSIS — G8929 Other chronic pain: Secondary | ICD-10-CM

## 2021-12-18 DIAGNOSIS — L409 Psoriasis, unspecified: Secondary | ICD-10-CM | POA: Diagnosis not present

## 2021-12-18 DIAGNOSIS — M1712 Unilateral primary osteoarthritis, left knee: Secondary | ICD-10-CM | POA: Diagnosis not present

## 2021-12-18 DIAGNOSIS — I7300 Raynaud's syndrome without gangrene: Secondary | ICD-10-CM

## 2021-12-18 DIAGNOSIS — M51369 Other intervertebral disc degeneration, lumbar region without mention of lumbar back pain or lower extremity pain: Secondary | ICD-10-CM

## 2021-12-18 DIAGNOSIS — Z79899 Other long term (current) drug therapy: Secondary | ICD-10-CM

## 2021-12-18 DIAGNOSIS — M533 Sacrococcygeal disorders, not elsewhere classified: Secondary | ICD-10-CM

## 2021-12-18 DIAGNOSIS — L4050 Arthropathic psoriasis, unspecified: Secondary | ICD-10-CM

## 2021-12-18 DIAGNOSIS — Z8719 Personal history of other diseases of the digestive system: Secondary | ICD-10-CM

## 2021-12-18 DIAGNOSIS — K52831 Collagenous colitis: Secondary | ICD-10-CM

## 2021-12-18 DIAGNOSIS — D692 Other nonthrombocytopenic purpura: Secondary | ICD-10-CM

## 2021-12-18 DIAGNOSIS — M7062 Trochanteric bursitis, left hip: Secondary | ICD-10-CM

## 2021-12-19 ENCOUNTER — Telehealth: Payer: Self-pay | Admitting: *Deleted

## 2021-12-19 LAB — CBC WITH DIFFERENTIAL/PLATELET
Absolute Monocytes: 996 cells/uL — ABNORMAL HIGH (ref 200–950)
Basophils Absolute: 96 cells/uL (ref 0–200)
Basophils Relative: 0.8 %
Eosinophils Absolute: 552 cells/uL — ABNORMAL HIGH (ref 15–500)
Eosinophils Relative: 4.6 %
HCT: 37.9 % (ref 35.0–45.0)
Hemoglobin: 12.5 g/dL (ref 11.7–15.5)
Lymphs Abs: 2712 cells/uL (ref 850–3900)
MCH: 32.1 pg (ref 27.0–33.0)
MCHC: 33 g/dL (ref 32.0–36.0)
MCV: 97.2 fL (ref 80.0–100.0)
MPV: 11 fL (ref 7.5–12.5)
Monocytes Relative: 8.3 %
Neutro Abs: 7644 cells/uL (ref 1500–7800)
Neutrophils Relative %: 63.7 %
Platelets: 245 10*3/uL (ref 140–400)
RBC: 3.9 10*6/uL (ref 3.80–5.10)
RDW: 13.1 % (ref 11.0–15.0)
Total Lymphocyte: 22.6 %
WBC: 12 10*3/uL — ABNORMAL HIGH (ref 3.8–10.8)

## 2021-12-19 LAB — COMPLETE METABOLIC PANEL WITH GFR
AG Ratio: 1.7 (calc) (ref 1.0–2.5)
ALT: 23 U/L (ref 6–29)
AST: 22 U/L (ref 10–35)
Albumin: 4 g/dL (ref 3.6–5.1)
Alkaline phosphatase (APISO): 48 U/L (ref 37–153)
BUN: 23 mg/dL (ref 7–25)
CO2: 23 mmol/L (ref 20–32)
Calcium: 9.4 mg/dL (ref 8.6–10.4)
Chloride: 107 mmol/L (ref 98–110)
Creat: 0.79 mg/dL (ref 0.60–1.00)
Globulin: 2.3 g/dL (calc) (ref 1.9–3.7)
Glucose, Bld: 101 mg/dL — ABNORMAL HIGH (ref 65–99)
Potassium: 3.4 mmol/L — ABNORMAL LOW (ref 3.5–5.3)
Sodium: 139 mmol/L (ref 135–146)
Total Bilirubin: 0.2 mg/dL (ref 0.2–1.2)
Total Protein: 6.3 g/dL (ref 6.1–8.1)
eGFR: 80 mL/min/{1.73_m2} (ref >=60)

## 2021-12-19 NOTE — Progress Notes (Signed)
CBC stable.  Potassium is borderline low-3.4. Please clarify if she has had any diarrhea/vomiting? Recommend repeating BMP with PCP. rest of CMP WNL.

## 2021-12-19 NOTE — Telephone Encounter (Signed)
-----   Message from Shona Needles, RT sent at 12/19/2021 10:04 AM EDT ----- Please apply for bil visco. Thank you.  I called patient, X-rays of both knees are consistent with moderate osteoarthritis and moderate chondromalacia patella.

## 2021-12-19 NOTE — Telephone Encounter (Signed)
Patient approved for assistance from [employer] until 04/01/22

## 2021-12-19 NOTE — Progress Notes (Signed)
X-rays of both knees are consistent with moderate osteoarthritis and moderate chondromalacia patella.

## 2021-12-19 NOTE — Telephone Encounter (Signed)
VOB submitted for Orthovisc, bilateral knees ?BV pending ? ?

## 2021-12-20 DIAGNOSIS — H02831 Dermatochalasis of right upper eyelid: Secondary | ICD-10-CM | POA: Diagnosis not present

## 2021-12-20 DIAGNOSIS — H02834 Dermatochalasis of left upper eyelid: Secondary | ICD-10-CM | POA: Diagnosis not present

## 2021-12-20 NOTE — Telephone Encounter (Signed)
Please call to schedule visco injections.  Approved for Orthovisc, bilateral knee(s). [place] Patient responsible for 20% product co-pay and 20% coinsurance $25 co-pay Deductibles do no apply No pre-certifications

## 2021-12-21 ENCOUNTER — Ambulatory Visit: Payer: PPO | Admitting: Physician Assistant

## 2021-12-28 ENCOUNTER — Telehealth: Payer: Self-pay | Admitting: Rheumatology

## 2021-12-28 DIAGNOSIS — M170 Bilateral primary osteoarthritis of knee: Secondary | ICD-10-CM

## 2021-12-28 DIAGNOSIS — M7061 Trochanteric bursitis, right hip: Secondary | ICD-10-CM

## 2021-12-28 DIAGNOSIS — G894 Chronic pain syndrome: Secondary | ICD-10-CM

## 2021-12-28 DIAGNOSIS — G8929 Other chronic pain: Secondary | ICD-10-CM

## 2021-12-28 NOTE — Telephone Encounter (Signed)
Patient called the office requesting a referral to [facility] physical therapy for dry needling.

## 2021-12-28 NOTE — Telephone Encounter (Signed)
Referral placed. Left message to advise patient we will place referral and to be aware that with Aspirin it can cause increased bruising with dry needling.

## 2021-12-28 NOTE — Telephone Encounter (Signed)
Ok to send the referral.  Aspirin can cause increased bruising with dry needling.

## 2022-01-06 ENCOUNTER — Other Ambulatory Visit: Payer: Self-pay | Admitting: Physician Assistant

## 2022-01-08 ENCOUNTER — Ambulatory Visit: Payer: PPO | Attending: Physician Assistant | Admitting: Physician Assistant

## 2022-01-08 DIAGNOSIS — M170 Bilateral primary osteoarthritis of knee: Secondary | ICD-10-CM | POA: Diagnosis not present

## 2022-01-08 MED ORDER — LIDOCAINE HCL 1 % IJ SOLN
1.5000 mL | INTRAMUSCULAR | Status: AC | PRN
  Administered 2022-01-08: 1.5 mL

## 2022-01-08 MED ORDER — HYALURONAN 30 MG/2ML IX SOSY
30.0000 mg | PREFILLED_SYRINGE | INTRA_ARTICULAR | Status: AC | PRN
  Administered 2022-01-08: 30 mg via INTRA_ARTICULAR

## 2022-01-08 NOTE — Telephone Encounter (Signed)
Next Visit: 03/19/2022  Last Visit: 12/18/2021  Last Fill: 10/16/2021  Dx: Psoriatic arthritis   Current Dose per office note on 12/18/2021: prednisone 5 mg 1 tablet daily.   Okay to refill Prednisone?

## 2022-01-08 NOTE — Progress Notes (Signed)
   Procedure Note  Patient: Tonya Hall             Date of Birth: 01-24-1951           MRN: [id number]             Visit Date: 01/08/2022  Procedures: Visit Diagnoses:  1. Primary osteoarthritis of both knees    Orthovisc #1 bilateral knees, B/B Large Joint Inj: bilateral knee on 01/08/2022 11:09 AM Indications: pain Details: 25 G 1.5 in needle, medial approach  Arthrogram: No  Medications (Right): 1.5 mL lidocaine 1 %; 30 mg Hyaluronan 30 MG/2ML Aspirate (Right): 0 mL Medications (Left): 1.5 mL lidocaine 1 %; 30 mg Hyaluronan 30 MG/2ML Aspirate (Left): 0 mL Outcome: tolerated well, no immediate complications Procedure, treatment alternatives, risks and benefits explained, specific risks discussed. Consent was given by the patient. Immediately prior to procedure a time out was called to verify the correct patient, procedure, equipment, support staff and site/side marked as required. Patient was prepped and draped in the usual sterile fashion.    Patient tolerated the procedure well.  Aftercare was discussed.  Hazel Sams, PA-C

## 2022-01-10 ENCOUNTER — Ambulatory Visit: Payer: PPO | Admitting: Podiatry

## 2022-01-10 ENCOUNTER — Encounter: Payer: Self-pay | Admitting: Podiatry

## 2022-01-10 ENCOUNTER — Ambulatory Visit (INDEPENDENT_AMBULATORY_CARE_PROVIDER_SITE_OTHER): Payer: PPO

## 2022-01-10 ENCOUNTER — Ambulatory Visit: Payer: PPO | Admitting: Internal Medicine

## 2022-01-10 ENCOUNTER — Telehealth: Payer: Self-pay | Admitting: Internal Medicine

## 2022-01-10 DIAGNOSIS — G5761 Lesion of plantar nerve, right lower limb: Secondary | ICD-10-CM

## 2022-01-10 DIAGNOSIS — M722 Plantar fascial fibromatosis: Secondary | ICD-10-CM

## 2022-01-10 MED ORDER — TRIAMCINOLONE ACETONIDE 10 MG/ML IJ SUSP
10.0000 mg | Freq: Once | INTRAMUSCULAR | Status: AC
  Administered 2022-01-10: 10 mg

## 2022-01-10 NOTE — Telephone Encounter (Signed)
Pt has not had repatha in 1.5 months d/t being unable to afford medication. Pt applied through the [employer] for financial assistance and was approved. Pt stated she just received medication and will resume taking it as prescribed. Pt will be r/s to see provider. Pt aware to have lab work completed.   Pt had no other questions or concerns at this time.

## 2022-01-10 NOTE — Telephone Encounter (Signed)
Pt wants to know if she still needs to come to her appt today because she didn't get her labs done prior and she hasn't been on repatha in a month

## 2022-01-11 NOTE — Progress Notes (Signed)
Subjective:   Patient ID: Tonya Hall, female   DOB: 71 y.o.   MRN: [id number]   HPI Patient presents stating that the right heel has become sore and there is a little bit of pain forefoot but worse in the heel neurovascular   ROS      Objective:  Physical Exam  Status intact with inflammation pain around the right plantar fascia at the insertion of the tendon into the calcaneus with fluid buildup     Assessment:  Acute Planter fasciitis right inflammation fluid around the medial band     Plan:  8 NP reviewed condition sterile prep injected the plantar fascia 3 mg Kenalog 5 mg Xylocaine instructed on stretching exercises and supportive shoes reappoint as needed  X-rays indicate small spur no indication stress fracture arthritis

## 2022-01-13 ENCOUNTER — Other Ambulatory Visit: Payer: Self-pay | Admitting: Physician Assistant

## 2022-01-15 ENCOUNTER — Ambulatory Visit: Payer: PPO | Attending: Physician Assistant | Admitting: Physician Assistant

## 2022-01-15 DIAGNOSIS — M170 Bilateral primary osteoarthritis of knee: Secondary | ICD-10-CM

## 2022-01-15 MED ORDER — HYALURONAN 30 MG/2ML IX SOSY
30.0000 mg | PREFILLED_SYRINGE | INTRA_ARTICULAR | Status: AC | PRN
  Administered 2022-01-15: 30 mg via INTRA_ARTICULAR

## 2022-01-15 MED ORDER — LIDOCAINE HCL 1 % IJ SOLN
1.5000 mL | INTRAMUSCULAR | Status: AC | PRN
  Administered 2022-01-15: 1.5 mL

## 2022-01-15 NOTE — Progress Notes (Signed)
   Procedure Note  Patient: Tonya Hall             Date of Birth: 09/13/1950           MRN: [id number]             Visit Date: 01/15/2022  Procedures: Visit Diagnoses:  1. Primary osteoarthritis of both knees    Orthovisc #2 bilateral knees, B/B Large Joint Inj: bilateral knee on 01/15/2022 1:16 PM Indications: pain Details: 25 G 1.5 in needle, medial approach  Arthrogram: No  Medications (Right): 1.5 mL lidocaine 1 %; 30 mg Hyaluronan 30 MG/2ML Aspirate (Right): 0 mL Medications (Left): 1.5 mL lidocaine 1 %; 30 mg Hyaluronan 30 MG/2ML Aspirate (Left): 0 mL Outcome: tolerated well, no immediate complications Procedure, treatment alternatives, risks and benefits explained, specific risks discussed. Consent was given by the patient. Immediately prior to procedure a time out was called to verify the correct patient, procedure, equipment, support staff and site/side marked as required. Patient was prepped and draped in the usual sterile fashion.      Patient tolerated the procedure well.  Aftercare was discussed.  Hazel Sams, PA-C

## 2022-01-16 DIAGNOSIS — M5432 Sciatica, left side: Secondary | ICD-10-CM | POA: Diagnosis not present

## 2022-01-16 DIAGNOSIS — M5416 Radiculopathy, lumbar region: Secondary | ICD-10-CM | POA: Diagnosis not present

## 2022-01-22 ENCOUNTER — Ambulatory Visit: Payer: PPO | Attending: Physician Assistant | Admitting: Physician Assistant

## 2022-01-22 DIAGNOSIS — M170 Bilateral primary osteoarthritis of knee: Secondary | ICD-10-CM | POA: Diagnosis not present

## 2022-01-22 MED ORDER — LIDOCAINE HCL 1 % IJ SOLN
1.5000 mL | INTRAMUSCULAR | Status: AC | PRN
  Administered 2022-01-22: 1.5 mL

## 2022-01-22 MED ORDER — HYALURONAN 30 MG/2ML IX SOSY
30.0000 mg | PREFILLED_SYRINGE | INTRA_ARTICULAR | Status: AC | PRN
  Administered 2022-01-22: 30 mg via INTRA_ARTICULAR

## 2022-01-22 NOTE — Progress Notes (Signed)
   Procedure Note  Patient: Tonya Hall             Date of Birth: 08-04-1950           MRN: [id number]             Visit Date: 01/22/2022  Procedures: Visit Diagnoses:  1. Primary osteoarthritis of both knees     Orthovisc #3 bilateral knees, B/B Large Joint Inj: bilateral knee on 01/22/2022 12:48 PM Indications: pain Details: 25 G 1.5 in needle, medial approach  Arthrogram: No  Medications (Right): 1.5 mL lidocaine 1 %; 30 mg Hyaluronan 30 MG/2ML Aspirate (Right): 0 mL Medications (Left): 1.5 mL lidocaine 1 %; 30 mg Hyaluronan 30 MG/2ML Aspirate (Left): 0 mL Outcome: tolerated well, no immediate complications Procedure, treatment alternatives, risks and benefits explained, specific risks discussed. Consent was given by the patient. Immediately prior to procedure a time out was called to verify the correct patient, procedure, equipment, support staff and site/side marked as required. Patient was prepped and draped in the usual sterile fashion.      Patient tolerated the procedure well.   Aftercare was discussed.  Hazel Sams, PA-C

## 2022-01-24 ENCOUNTER — Encounter (HOSPITAL_COMMUNITY): Payer: Self-pay | Admitting: Physical Therapy

## 2022-01-24 ENCOUNTER — Ambulatory Visit (HOSPITAL_COMMUNITY): Payer: PPO | Attending: Rheumatology | Admitting: Physical Therapy

## 2022-01-24 DIAGNOSIS — M170 Bilateral primary osteoarthritis of knee: Secondary | ICD-10-CM | POA: Diagnosis not present

## 2022-01-24 DIAGNOSIS — M25552 Pain in left hip: Secondary | ICD-10-CM | POA: Insufficient documentation

## 2022-01-24 DIAGNOSIS — M7061 Trochanteric bursitis, right hip: Secondary | ICD-10-CM | POA: Diagnosis not present

## 2022-01-24 DIAGNOSIS — G8929 Other chronic pain: Secondary | ICD-10-CM | POA: Insufficient documentation

## 2022-01-24 DIAGNOSIS — G894 Chronic pain syndrome: Secondary | ICD-10-CM | POA: Diagnosis not present

## 2022-01-24 DIAGNOSIS — M7062 Trochanteric bursitis, left hip: Secondary | ICD-10-CM | POA: Insufficient documentation

## 2022-01-24 DIAGNOSIS — Z23 Encounter for immunization: Secondary | ICD-10-CM | POA: Diagnosis not present

## 2022-01-24 DIAGNOSIS — M533 Sacrococcygeal disorders, not elsewhere classified: Secondary | ICD-10-CM | POA: Diagnosis not present

## 2022-01-24 NOTE — Therapy (Signed)
OUTPATIENT PHYSICAL THERAPY LOWER EXTREMITY EVALUATION   Patient Name: Tonya Hall MRN: [id number] DOB:October 13, 1950, 71 y.o., female Today's Date: 01/24/2022   PT End of Session - 01/24/22 0938     Visit Number 1    Number of Visits 12    Date for PT Re-Evaluation 03/07/22    Authorization Type HE[employer] adv    PT Start Time 0901    PT Stop Time 0940    PT Time Calculation (min) 39 min    Activity Tolerance Patient tolerated treatment well;Patient limited by pain    Behavior During Therapy [facility] for tasks assessed/performed             Past Medical History:  Diagnosis Date   Asthma    Albuterol in haler prn   Cataract    immature unsure which eye   Chronic back pain    COVID-19 October/November 2020   Degenerative disk disease    psoriatic   Diabetes mellitus without complication (HCC)    diet controlled   Esophageal motility disorder    Non-specific, see modified barium study/speech path, BP   Fibromyalgia    GERD (gastroesophageal reflux disease)    takes Protonix bid   Headache 2019   History of blood transfusion    post c-section   History of bronchitis    HTN (hypertension)    Hyperlipidemia    takes Pravastatin daily   Lymphocytic colitis 05/26/2010   Responded to Entocort x 3 MOS   Neuropathy    Nonallergic rhinitis    Osteoporosis    gets Boniva every 3 months   Peripheral edema    takes HCTZ daily   Pneumonia 02/2019   Psoriatic arthritis ([place])    Dr. Katherina Right   Psoriatic arthritis [facility])    bilateral hands   Raynaud's disease    Seasonal allergies    Shingles 2020   SUI (stress urinary incontinence, female)    Urinary urgency    Past Surgical History:  Procedure Laterality Date   ANTERIOR CERVICAL DECOMP/DISCECTOMY FUSION N/A 03/09/2020   Procedure: ANTERIOR CERVICAL DECOMPRESSION/DISCECTOMY FUSION, INTERBODY PROSTHESIS, PLATE SCREWS CERVICAL FIVE-SIX, CERVICAL SIX-SEVEN;  Surgeon: Newman Pies, MD;  Location: [place];  Service:  Neurosurgery;  Laterality: N/A;   BACK SURGERY  2003   BIOPSY  04/17/2016   Procedure: BIOPSY;  Surgeon: Danie Binder, MD;  [facility];  Service: Endoscopy;;  random colon bx's   CERVICAL SPINE SURGERY  09/09/2017   CESAREAN SECTION  1974, 1978       COLONOSCOPY  06/19/2008   XQJ:JHERD internal hemorrhoids/mild sigmoin colon diverticulosis   COLONOSCOPY  2012   Dr. Gala Romney: normal rectum, diverticula, lymphocytic colitis    COLONOSCOPY WITH PROPOFOL N/A 04/17/2016   Dr. Oneida Alar: Normal terminal ileum, internal/external hemorrhoids, random colon biopsies consistent with collagenous colitis   DILATION AND CURETTAGE OF UTERUS  1977   spontaneous abortion   ESOPHAGOGASTRODUODENOSCOPY  06/19/2008   EYC:XKGYJEHU gastritis   LUMBAR DISC ARTHROPLASTY  09/1996   LUMBAR FUSION  6/03, 11/08, 11/14   L4-5 fusion, L2-3 fusion, L1-2   LUMBAR FUSION  08/03/2020   L4-L5   LUMBAR LAMINECTOMY  03/2020   MOUTH SURGERY  12/07/2021   oral surgery   ODONTOID SCREW INSERTION N/A 01/27/2018   Procedure: ODONTOID SCREW INSERTION;  Surgeon: Newman Pies, MD;  Location: [place];  Service: Neurosurgery;  Laterality: N/A;  ODONTOID SCREW INSERTION   TONSILLECTOMY     TONSILLECTOMY AND ADENOIDECTOMY  TUBAL LIGATION     Patient Active Problem List   Diagnosis Date Noted   Statin not tolerated 10/23/2021   Chronic allergic conjunctivitis 07/29/2021   Vasomotor rhinitis 07/29/2021   Benign neoplasm of skin of lower limb 05/17/2021   History of neoplasm 05/17/2021   Melanocytic nevi of left upper limb, including shoulder 05/17/2021   Melanocytic nevi of trunk 05/17/2021   Other seborrheic keratosis 05/17/2021   Diarrhea 03/10/2021   Fracture of pelvis ([place]) 01/27/2021   Pain of left hip joint 01/06/2021   Foraminal stenosis of lumbar region 08/03/2020   Cervical spondylosis with myelopathy and radiculopathy 03/09/2020   AKI (acute kidney injury) ([place]) 02/07/2019   Acute respiratory  failure with hypoxia ([place]) 02/06/2019   Acute respiratory disease due to COVID-19 virus 02/06/2019   Community acquired pneumonia 02/06/2019   Hyponatremia 02/06/2019   Asthma    Diabetes mellitus without complication ([place])    Closed nondisplaced odontoid fracture with type II morphology ([place]) 01/27/2018   Odontoid fracture ([place]) 01/24/2018   Chest pain 11/14/2017   Transaminitis 06/22/2016   Collagenous colitis 05/23/2016   Psoriasis 05/21/2016   High risk medication use 02/15/2016   Age-related osteoporosis without current pathological fracture 02/15/2016   Trochanteric bursitis, left hip 02/15/2016   Sacroiliitis, not elsewhere classified ([place]) 02/15/2016   Chronic pain syndrome 02/15/2016   Abnormal weight gain 06/25/2012   Hypertension 07/03/2011   Hypercholesteremia 07/03/2011   Psoriatic arthritis ([place]) 07/03/2011   Osteoarthritis of multiple joints 07/03/2011   Esophageal motility disorder 02/14/2011   Cough 02/14/2011   GERD 05/05/2010    PCP: [name] MD  REFERRING PROVIDER: Bo Merino, MD   REFERRING DIAG: M53.3,G89.29 (ICD-10-CM) - Chronic left SI joint pain M70.61,M70.62 (ICD-10-CM) - Trochanteric bursitis of both hips M17.0 (ICD-10-CM) - Primary osteoarthritis of both knees G89.4 (ICD-10-CM) - Chronic pain syndrome   THERAPY DIAG:  Pain in left hip  Sacrococcygeal disorders, not elsewhere classified  Rationale for Evaluation and Treatment Rehabilitation  ONSET DATE: Chronic  SUBJECTIVE:   SUBJECTIVE STATEMENT: Patient presents with chronic history of back, hip and SI joint pain. She has had several procedures and interventions. She gets regular injections which help briefly. Her LT side is worse than the right. She also report history of lumbar surgery ( discectomy and fusion)   PERTINENT HISTORY: Chronic pain, arthritis (psoriatic), lumbar surgery x 4 PAIN:  Are you having pain? Yes: NPRS scale: 5/10 Pain location: low back, LT hip  Pain  description: aches, sharp, constant  Aggravating factors: anything  Relieving factors: heating pad, rest  PRECAUTIONS: None  WEIGHT BEARING RESTRICTIONS: No  FALLS:  Has patient fallen in last 6 months? No  LIVING ENVIRONMENT: Lives with: lives with their family and lives with their spouse Lives in: House/apartment Stairs: Yes: External: 3 steps; can reach both Has following equipment at home: Single point cane, Environmental consultant - 2 wheeled, Wheelchair (manual), and shower chair  OCCUPATION: Retired   PLOF: Independent  PATIENT GOALS: Make the pain less    OBJECTIVE:   DIAGNOSTIC FINDINGS: NA  PATIENT SURVEYS:  FOTO 47%  COGNITION: Overall cognitive status: Within functional limits for tasks assessed    POSTURE: forward head, flexed trunk , and weight shift right  PALPATION: Mod TTP about LT SI, gluteals, greater trochanter   LOWER EXTREMITY ROM:  Active ROM Right eval Left eval  Hip flexion    Hip extension    Hip abduction    Hip adduction    Hip internal  rotation    Hip external rotation    Knee flexion    Knee extension    Ankle dorsiflexion    Ankle plantarflexion    Ankle inversion    Ankle eversion     (Blank rows = not tested)  LOWER EXTREMITY MMT:  MMT Right eval Left eval  Hip flexion 4 4  Hip extension 3- 3-  Hip abduction 4 4-  Hip adduction    Hip internal rotation    Hip external rotation    Knee flexion    Knee extension 4 4  Ankle dorsiflexion 4 4-  Ankle plantarflexion    Ankle inversion    Ankle eversion     (Blank rows = not tested)   FUNCTIONAL TESTS:  5 times sit to stand: 26.7 sec with hands on thighs   GAIT:  Assistive device utilized: None Level of assistance: Modified independence Comments: decreased stance on LT   TODAY'S TREATMENT   PATIENT EDUCATION:  Education details: on eval findings, POC and HEP  Person educated: Patient Education method: Explanation Education comprehension: verbalized  understanding  HOME EXERCISE PROGRAM: Access Code: [email or web address] URL: [email or web address] Date: 01/24/2022 Prepared by: Josue Hector  Exercises - Clamshell  - 2 x daily - 7 x weekly - 2 sets - 10 reps - Supine Lower Trunk Rotation  - 2 x daily - 7 x weekly - 1 sets - 10 reps - 5 second hold - Hooklying Gluteal Sets  - 2 x daily - 7 x weekly - 1 sets - 10 reps - 5 second hold  ASSESSMENT:  CLINICAL IMPRESSION: Patient is a 71 y.o. female who presents to physical therapy with complaint of chronic LT hip and low back pain. Patient demonstrates muscle weakness, reduced ROM, and fascial restrictions which are likely contributing to symptoms of pain and are negatively impacting patient ability to perform ADLs and functional mobility tasks. Patient will benefit from skilled physical therapy services to address these deficits to reduce pain and improve level of function with ADLs and functional mobility tasks.   OBJECTIVE IMPAIRMENTS: Abnormal gait, decreased activity tolerance, decreased balance, decreased mobility, difficulty walking, decreased ROM, decreased strength, increased fascial restrictions, improper body mechanics, postural dysfunction, and pain.   ACTIVITY LIMITATIONS: carrying, lifting, bending, standing, squatting, stairs, transfers, and locomotion level  PARTICIPATION LIMITATIONS: meal prep, cleaning, laundry, shopping, community activity, and yard work  PERSONAL FACTORS: Past/current experiences and Time since onset of injury/illness/exacerbation are also affecting patient's functional outcome.   REHAB POTENTIAL: Good  CLINICAL DECISION MAKING: Stable/uncomplicated  EVALUATION COMPLEXITY: Low   GOALS: SHORT TERM GOALS: Target date: 02/14/2022  Patient will be independent with initial HEP and self-management strategies to improve functional outcomes Baseline:  Goal status: INITIAL   LONG TERM GOALS: Target date: 03/07/2022  Patient will be independent  with advanced HEP and self-management strategies to improve functional outcomes Baseline:  Goal status: INITIAL  2.  Patient will improve FOTO score to predicted value to indicate improvement in functional outcomes Baseline: 47% Goal status: INITIAL  3.  Patient will report reduction of back/hip pain to <3/10 for improved quality of life and ability to perform ADLs  Baseline: 5-6/10 Goal status: INITIAL  4. Patient will have equal to or > 4/5 MMT throughout BLE to improve ability to perform functional mobility, stair ambulation and ADLs.  Baseline: See MMT Goal status: INITIAL  5. Patient will improve STS x 5 to <15 sec to demo improved functional mobility, balance and strength for improved ADLS  Baseline:26.7 sec with hands on thighs  Goal status: INITIAL  PLAN:  PT FREQUENCY: 2x/week  PT DURATION: 6 weeks  PLANNED INTERVENTIONS: Therapeutic exercises, Therapeutic activity, Neuromuscular re-education, Balance training, Gait training, Patient/Family education, Joint manipulation, Joint mobilization, Stair training, Aquatic Therapy, Dry Needling, Electrical stimulation, Spinal manipulation, Spinal mobilization, Cryotherapy, Moist heat, scar mobilization, Taping, Traction, Ultrasound, Biofeedback, Ionotophoresis '4mg'$ /ml Dexamethasone, and Manual therapy.   PLAN FOR NEXT SESSION: Progress glute and core strength as tolerated. Postural strength and correction for RT lateral shift. Manual to address hip pain and restriction  9:39 AM, 01/24/22 Josue Hector PT DPT  Physical Therapist with [facility]  [phone]

## 2022-01-29 ENCOUNTER — Telehealth: Payer: Self-pay | Admitting: Pharmacist

## 2022-01-29 NOTE — Telephone Encounter (Signed)
Received fax from [facility] that they are taking 2024 patient assistance renewal application through which she receives Materials engineer. Patient also receives OTEZLA from [employer] through patient assistance program. Patient states she prefers application to be mailed. She will complete her portion and return to clinic   Patient portion mailed today.   Provider portion placed in Hazel Sams, PA-C's folder to be signed   Will need to renew prior authorizations for Donnetta Hail and [name] individually to prevent denial (receive approval for one before submitting renewal for other med)   Knox Saliva, PharmD, MPH, BCPS, CPP Clinical Pharmacist (Rheumatology and Pulmonology)

## 2022-01-29 NOTE — Telephone Encounter (Signed)
Received fax from [facility] that they are taking 2024 patient assistance renewal application. Patient receives Materials engineer through patient assistance program. Patient states she prefers application to be mailed. She will complete her portion and return to clinic   Patient portion mailed today.   Provider portion placed in Hazel Sams, PA-C's folder to be signed   Will need to renew prior authorizations for [place] individually to prevent denial (receive approval for one before submitting renewal for other med).  Submitted a Prior Authorization RENEWAL request to  [employer]  for TALTZ via CoverMyMeds. PA approval letter will need to be sent with renewal application  Key: [id number]   Knox Saliva, PharmD, MPH, BCPS, CPP Clinical Pharmacist (Rheumatology and Pulmonology)

## 2022-01-30 ENCOUNTER — Telehealth: Payer: Self-pay | Admitting: Podiatry

## 2022-01-30 NOTE — Telephone Encounter (Signed)
Pt lvm following up on dm shoe order

## 2022-01-30 NOTE — Telephone Encounter (Signed)
Signed provider form of  [employer] PAP received from Hazel Sams, PA-C. Submission is pending patient portion to be returned to clinic by patient. Kandace Parkins PA approval letter from 2023 (expires 04/01/22).  Knox Saliva, PharmD, MPH, BCPS, CPP Clinical Pharmacist (Rheumatology and Pulmonology)

## 2022-01-30 NOTE — Telephone Encounter (Signed)
Per automated PA response: Prior Authorization duplicate/approved. Will attached PA approval letter from 2023.  Signed provider form of  [employer] PAP received from Hazel Sams, PA-C. Submission is pending patient portion to be returned to clinic by patient  Knox Saliva, PharmD, MPH, BCPS, CPP Clinical Pharmacist (Rheumatology and Pulmonology)

## 2022-02-05 ENCOUNTER — Ambulatory Visit: Payer: PPO | Admitting: Podiatry

## 2022-02-05 DIAGNOSIS — B351 Tinea unguium: Secondary | ICD-10-CM | POA: Diagnosis not present

## 2022-02-05 DIAGNOSIS — Q828 Other specified congenital malformations of skin: Secondary | ICD-10-CM | POA: Diagnosis not present

## 2022-02-05 DIAGNOSIS — E119 Type 2 diabetes mellitus without complications: Secondary | ICD-10-CM | POA: Diagnosis not present

## 2022-02-05 DIAGNOSIS — L602 Onychogryphosis: Secondary | ICD-10-CM

## 2022-02-05 NOTE — Telephone Encounter (Signed)
Spoke with pt and gave her the documentation that was received this morning from her PCP. I notified the pt that her order has been placed but [employer] is on backorder and it is taking longer to get orders in, pt will wait until 03/02/22 and see if we have gotten her shoes in yet and if we have not she will come back in to pick out a Apex shoe as they generally come in within 2 weeks of placing the order.

## 2022-02-05 NOTE — Progress Notes (Unsigned)
  Subjective:  Patient ID: Tonya Hall, female    DOB: February 27, 1951,  MRN: [id number]  Tonya Hall presents to clinic today for {jgcomplaint:23593}  Chief Complaint  Patient presents with   Callouses    Porokeratosis. Right plantar foot. Pt states pain is there but not as bad as before. Pt states she received an injections from Dr. Paulla Dolly 1 month ago that helped.    Nail Problem    Bilateral great toe nail detachment and yellow discoloration.    New problem(s): None. {jgcomplaint:23593}  PCP is [name], MD , and last visit was {Time; dates multiple:15870}.  Allergies  Allergen Reactions   Adalimumab Rash and Other (See Comments)    FEVER, Fast pulse Other reaction(s): Unknown   Imuran [Azathioprine Sodium] Rash    Denies Airway involvement   Methotrexate Other (See Comments)    Liver Enzyme elevation - after 1 month of use Other reaction(s): Unknown   Plaquenil [Hydroxychloroquine Sulfate] Rash    Denies airway involvement.    Sulfonamide Derivatives Rash    Occurred with Sulfasalazine. Rash only.    Azathioprine     Other reaction(s): Unknown   Ceftin [Cefuroxime Axetil] Nausea Only    Upset stomach   Cefuroxime     Other reaction(s): Unknown   Ezetimibe     Other reaction(s): Unknown   Insulin Detemir     Other reaction(s): rash   Lisinopril     Other reaction(s): Unknown   Metformin Hcl     Other reaction(s): Unknown   Morphine Sulfate     Other reaction(s): Unknown   Pravastatin Sodium     Other reaction(s): Liver abnormalities   Sulfamethoxazole-Trimethoprim     Other reaction(s): Unknown   Morphine Nausea And Vomiting    IV morphine caused N and V  After surgery.   Has tolerated Kadian in the past     Review of Systems: Negative except as noted in the HPI.  Objective: No changes noted in today's physical examination.  Tonya Hall is a pleasant 71 y.o. female {jgbodyhabitus:24098} AAO x 3.  Vascular Examination: Neurovascular status  unchanged bilaterally. Capillary refill time to digits immediate b/l. Palpable pedal pulses b/l LE. Pedal hair absent. No pain with calf compression b/l. Lower extremity skin temperature gradient warm to cool. Red to purple discoloration consistent with Raynaud's. No ischemia or gangrene. No clubbing b/l.  Dermatological Examination: No open wounds b/l LE. No interdigital macerations noted b/l LE. Elongated psoriatic toenails 1-5 b/l, with pitting of nailplates and tenderness to palpation.   Porokeratotic lesion(s) submet head 4 right foot. No erythema, no edema, no drainage, no fluctuance.  Musculoskeletal Examination: Muscle strength 5/5 to all lower extremity muscle groups bilaterally. Hallux valgus with bunion deformity noted right foot. Hammertoe deformity noted 2-5 b/l.  Neurological Examination: Protective sensation intact 5/5 intact bilaterally with 10g monofilament b/l. Vibratory sensation intact b/l. Proprioception intact bilaterally.  Assessment/Plan: 1. Overgrown toenails   2. Porokeratosis   3. Diabetes mellitus without complication ([place])     No orders of the defined types were placed in this encounter.   {Jgplan:23602::"-Patient/POA to call should there be question/concern in the interim."}   Return in about 3 months (around 05/08/2022).  Marzetta Board, DPM

## 2022-02-07 ENCOUNTER — Encounter: Payer: Self-pay | Admitting: Podiatry

## 2022-02-12 ENCOUNTER — Other Ambulatory Visit: Payer: Self-pay | Admitting: *Deleted

## 2022-02-12 DIAGNOSIS — L409 Psoriasis, unspecified: Secondary | ICD-10-CM

## 2022-02-12 MED ORDER — OTEZLA 30 MG PO TABS: 30.0000 mg | ORAL_TABLET | Freq: Two times a day (BID) | ORAL | 0 refills | Status: DC

## 2022-02-12 NOTE — Telephone Encounter (Signed)
Patient contacted the office and requested refill on Otezla sent to [facility].   Next Visit: 03/19/2022  Last Visit: 12/18/2021  Last Fill: 11/08/2021  DX: Psoriatic arthritis    Current Dose per office note 12/18/2021: [name] 30 mg 1 tablet by mouth twice daily   Labs: 12/18/2021 CBC stable.  Potassium is borderline low-3.4. rest of CMP WNL   Okay to refill Otezla?

## 2022-02-16 ENCOUNTER — Telehealth: Payer: Self-pay | Admitting: *Deleted

## 2022-02-16 NOTE — Telephone Encounter (Signed)
Faxed in PAP for Repatha to [place] -- for 2024

## 2022-02-17 ENCOUNTER — Other Ambulatory Visit (HOSPITAL_BASED_OUTPATIENT_CLINIC_OR_DEPARTMENT_OTHER): Payer: Self-pay | Admitting: Obstetrics & Gynecology

## 2022-02-20 ENCOUNTER — Ambulatory Visit (INDEPENDENT_AMBULATORY_CARE_PROVIDER_SITE_OTHER): Payer: PPO

## 2022-02-20 DIAGNOSIS — E119 Type 2 diabetes mellitus without complications: Secondary | ICD-10-CM

## 2022-02-20 DIAGNOSIS — M722 Plantar fascial fibromatosis: Secondary | ICD-10-CM

## 2022-02-20 NOTE — Telephone Encounter (Signed)
Received a fax from  [facility] regarding an approval for [place] patient assistance from 04/02/22 to 04/02/23.   Phone number: [phone]  Knox Saliva, PharmD, MPH, BCPS, CPP Clinical Pharmacist (Rheumatology and Pulmonology)

## 2022-02-20 NOTE — Progress Notes (Signed)
Patient presents to the office today with issues concerning the diabetic shoes picked up on 02/20/22.   The shoes feel too big and wide.  We will send the orthofeet brand, style Joneen Boers, size 7 wide women back.   Reorder: A330W Apex Lisa 6.5 medium  Patient left insoles here in the office. Will dispense when new order arrives.  Patient will be notified for a fitting appointment once the shoes arrive in office.

## 2022-02-20 NOTE — Telephone Encounter (Signed)
Submitted Patient Assistance RENEWAL Application to [employer] for TALTZ along with provider portion, PA, med list, insurance card copy. Will update patient when we receive a response.  Fax# [phone] Phone# [phone]  Knox Saliva, PharmD, MPH, BCPS, CPP Clinical Pharmacist (Rheumatology and Pulmonology)

## 2022-02-20 NOTE — Telephone Encounter (Signed)
Submitted Patient Assistance Application to [employer] for Tonya Hall along with provider portion, patient portion, med list, insurance card copy, nad PA approval letter. Will update patient when we receive a response.  Fax# [phone] Phone# [phone]  Knox Saliva, PharmD, MPH, BCPS, CPP Clinical Pharmacist (Rheumatology and Pulmonology)

## 2022-02-26 NOTE — Telephone Encounter (Signed)
Confirmed with pt that she is currently taking the medication and does need a refill.

## 2022-02-27 ENCOUNTER — Ambulatory Visit
Admission: EM | Admit: 2022-02-27 | Discharge: 2022-02-27 | Disposition: A | Discharge status: Home / Self Care | Payer: PPO | Attending: Nurse Practitioner | Admitting: Nurse Practitioner

## 2022-02-27 DIAGNOSIS — J069 Acute upper respiratory infection, unspecified: Secondary | ICD-10-CM | POA: Diagnosis not present

## 2022-02-27 DIAGNOSIS — Z1152 Encounter for screening for COVID-19: Secondary | ICD-10-CM | POA: Insufficient documentation

## 2022-02-27 DIAGNOSIS — R059 Cough, unspecified: Secondary | ICD-10-CM | POA: Diagnosis not present

## 2022-02-27 DIAGNOSIS — E1165 Type 2 diabetes mellitus with hyperglycemia: Secondary | ICD-10-CM

## 2022-02-27 LAB — RESP PANEL BY RT-PCR (FLU A&B, COVID) ARPGX2
Influenza A by PCR: NEGATIVE
Influenza B by PCR: NEGATIVE
SARS Coronavirus 2 by RT PCR: NEGATIVE

## 2022-02-27 LAB — POCT FASTING CBG KUC MANUAL ENTRY: POCT Glucose (KUC): 140 mg/dL — AB (ref 70–99)

## 2022-02-27 NOTE — ED Triage Notes (Signed)
Pt reports feeling weak and fatiguex 3 days; cough since last night.   Reports 2 negative COVID test.

## 2022-02-27 NOTE — Discharge Instructions (Addendum)
It sounds like you have a viral upper respiratory infection.  Symptoms should improve over the next week to 10 days.  If you develop chest pain or shortness of breath, go to the emergency room.  We have tested you today for COVID-19.  You will see the results in Mychart and we will call you with positive results.    Please stay home and isolate until you are aware of the results.    Some things that can make you feel better are: - Increased rest - Increasing fluid with water/sugar free electrolytes - Acetaminophen and ibuprofen as needed for fever/pain - Salt water gargling, chloraseptic spray and throat lozenges - OTC guaifenesin (Mucinex) 600 mg twice daily - Saline sinus flushes or a neti pot - Humidifying the air

## 2022-02-27 NOTE — ED Provider Notes (Signed)
[facility]    CSN: [id number] Arrival date & time: 02/27/22  1403      History   Chief Complaint Chief Complaint  Patient presents with   Cough   Weakness    HPI Tonya Hall is a 71 y.o. female.   Patient presents for 3 days of worsening chronic cough, decreased appetite, weakness, fatigue, and states "I just feel bad."  She denies fever, body aches, chills, shortness of breath or chest pain, chest or nasal congestion, postnasal drainage, sore throat, headache, ear pain or drainage, abdominal pain, nausea/vomiting, diarrhea, loss of taste or smell, or new rash.  She also denies dysuria, urinary frequency or urgency, foul urinary odor, flank pain.  Has not taken anything for symptoms so far.  Reports she has had 2 COVID-19 vaccines.  She has tested positive for COVID-19 twice, last time she tested positive she took an antiviral and "it did not help me much."    Medical history is significant for type 2 diabetes, on Ozempic and insulin.  Has not checked her blood sugar today.  Reports she has only taken prednisone today so far for which she takes for psoriatic arthritis.      Past Medical History:  Diagnosis Date   Asthma    Albuterol in haler prn   Cataract    immature unsure which eye   Chronic back pain    COVID-19 October/November 2020   Degenerative disk disease    psoriatic   Diabetes mellitus without complication (HCC)    diet controlled   Esophageal motility disorder    Non-specific, see modified barium study/speech path, BP   Fibromyalgia    GERD (gastroesophageal reflux disease)    takes Protonix bid   Headache 2019   History of blood transfusion    post c-section   History of bronchitis    HTN (hypertension)    Hyperlipidemia    takes Pravastatin daily   Lymphocytic colitis 05/26/2010   Responded to Entocort x 3 MOS   Neuropathy    Nonallergic rhinitis    Osteoporosis    gets Boniva every 3 months   Peripheral edema    takes HCTZ  daily   Pneumonia 02/2019   Psoriatic arthritis ([place])    Dr. Katherina Right   Psoriatic arthritis [facility])    bilateral hands   Raynaud's disease    Seasonal allergies    Shingles 2020   SUI (stress urinary incontinence, female)    Urinary urgency     Patient Active Problem List   Diagnosis Date Noted   Statin not tolerated 10/23/2021   Chronic allergic conjunctivitis 07/29/2021   Vasomotor rhinitis 07/29/2021   Benign neoplasm of skin of lower limb 05/17/2021   History of neoplasm 05/17/2021   Melanocytic nevi of left upper limb, including shoulder 05/17/2021   Melanocytic nevi of trunk 05/17/2021   Other seborrheic keratosis 05/17/2021   Diarrhea 03/10/2021   Fracture of pelvis ([place]) 01/27/2021   Pain of left hip joint 01/06/2021   Foraminal stenosis of lumbar region 08/03/2020   Cervical spondylosis with myelopathy and radiculopathy 03/09/2020   AKI (acute kidney injury) ([place]) 02/07/2019   Acute respiratory failure with hypoxia ([place]) 02/06/2019   Acute respiratory disease due to COVID-19 virus 02/06/2019   Community acquired pneumonia 02/06/2019   Hyponatremia 02/06/2019   Asthma    Diabetes mellitus without complication ([place])    Closed nondisplaced odontoid fracture with type II morphology ([place]) 01/27/2018   Odontoid fracture ([place]) 01/24/2018  Chest pain 11/14/2017   Transaminitis 06/22/2016   Collagenous colitis 05/23/2016   Psoriasis 05/21/2016   High risk medication use 02/15/2016   Age-related osteoporosis without current pathological fracture 02/15/2016   Trochanteric bursitis, left hip 02/15/2016   Sacroiliitis, not elsewhere classified ([place]) 02/15/2016   Chronic pain syndrome 02/15/2016   Abnormal weight gain 06/25/2012   Hypertension 07/03/2011   Hypercholesteremia 07/03/2011   Psoriatic arthritis ([place]) 07/03/2011   Osteoarthritis of multiple joints 07/03/2011   Esophageal motility disorder 02/14/2011   Cough 02/14/2011   GERD 05/05/2010    Past Surgical  History:  Procedure Laterality Date   ANTERIOR CERVICAL DECOMP/DISCECTOMY FUSION N/A 03/09/2020   Procedure: ANTERIOR CERVICAL DECOMPRESSION/DISCECTOMY FUSION, INTERBODY PROSTHESIS, PLATE SCREWS CERVICAL FIVE-SIX, CERVICAL SIX-SEVEN;  Surgeon: Newman Pies, MD;  Location: [place];  Service: Neurosurgery;  Laterality: N/A;   BACK SURGERY  2003   BIOPSY  04/17/2016   Procedure: BIOPSY;  Surgeon: Danie Binder, MD;  [facility];  Service: Endoscopy;;  random colon bx's   CERVICAL SPINE SURGERY  09/09/2017   CESAREAN SECTION  1974, 1978       COLONOSCOPY  06/19/2008   [id number] internal hemorrhoids/mild sigmoin colon diverticulosis   COLONOSCOPY  2012   Dr. Gala Romney: normal rectum, diverticula, lymphocytic colitis    COLONOSCOPY WITH PROPOFOL N/A 04/17/2016   Dr. Oneida Alar: Normal terminal ileum, internal/external hemorrhoids, random colon biopsies consistent with collagenous colitis   DILATION AND CURETTAGE OF UTERUS  1977   spontaneous abortion   ESOPHAGOGASTRODUODENOSCOPY  06/19/2008   ESP:[id number] gastritis   LUMBAR DISC ARTHROPLASTY  09/1996   LUMBAR FUSION  6/03, 11/08, 11/14   L4-5 fusion, L2-3 fusion, L1-2   LUMBAR FUSION  08/03/2020   L4-L5   LUMBAR LAMINECTOMY  03/2020   MOUTH SURGERY  12/07/2021   oral surgery   ODONTOID SCREW INSERTION N/A 01/27/2018   Procedure: ODONTOID SCREW INSERTION;  Surgeon: Newman Pies, MD;  Location: [place];  Service: Neurosurgery;  Laterality: N/A;  ODONTOID SCREW INSERTION   TONSILLECTOMY     TONSILLECTOMY AND ADENOIDECTOMY     TUBAL LIGATION      OB History     Gravida  3   Para  2   Term  2   Preterm      AB  1   Living  2      SAB  0   IAB      Ectopic      Multiple      Live Births               Home Medications    Prior to Admission medications   Medication Sig Start Date End Date Taking? Authorizing Provider  albuterol (PROVENTIL HFA;VENTOLIN HFA) 108 (90 BASE) MCG/ACT inhaler Inhale 2 puffs  into the lungs every 6 (six) hours as needed for wheezing or shortness of breath.     [provider]  Apremilast (OTEZLA) 30 MG TABS Take 1 tablet (30 mg total) by mouth 2 (two) times daily. 02/12/22   Ofilia Neas, PA-C  ascorbic acid (VITAMIN C) 500 MG tablet Take 500 mg by mouth 2 (two) times daily. Patient not taking: Reported on 12/18/2021    [provider]  aspirin 81 MG tablet Take 81 mg by mouth daily.    [provider]  azelastine (ASTELIN) 0.1 % nasal spray Place 1 spray into both nostrils 2 (two) times daily. 08/12/19   [provider]  azelastine (OPTIVAR) 0.05 %  ophthalmic solution Apply 1 drop to eye 2 (two) times daily. 07/28/21   [provider]  Calcium Carb-Cholecalciferol (CALCIUM 600 + D PO) Take 1 tablet by mouth 2 (two) times daily.     [provider]  Cetirizine HCl 10 MG CAPS Take by oral route.    [provider]  chlorhexidine (PERIDEX) 0.12 % solution SMARTSIG:0.5 Ounce(s) By Mouth Twice Daily 12/18/21   [provider]  denosumab (PROLIA) 60 MG/ML SOSY injection Inject 60 mg into the skin every 6 (six) months.    [provider]  erythromycin ophthalmic ointment 3 (three) times daily. 01/06/22   [provider]  Evolocumab (REPATHA SURECLICK) 601 MG/ML SOAJ Inject 1 Dose into the skin every 14 (fourteen) days. Patient not taking: Reported on 12/18/2021 08/30/21   Pixie Casino, MD  fish oil-omega-3 fatty acids 1000 MG capsule Take 1 g by mouth daily.    [provider]  gabapentin (NEURONTIN) 300 MG capsule Take 600 mg by mouth See admin instructions. Take 600 mg 3 times daily, may take a 4th 300 mg dose as needed for pain 05/05/18   [provider]  gabapentin (NEURONTIN) 600 MG tablet Take 600 mg by mouth 3 (three) times daily. 01/06/22   [provider]  Ginger, Zingiber officinalis, (GINGER ROOT) 500 MG CAPS Take 500 mg by mouth daily.    [provider]  Glucosamine Sulfate 1000 MG CAPS Take 2,000 mg by mouth daily. 07/03/11   Zenia Resides, MD  HUMULIN N KWIKPEN 100 UNIT/ML Kiwkpen Inject 10 Units into the skin at bedtime. 03/02/20   [provider]  HYDROcodone-acetaminophen (NORCO/VICODIN) 5-325 MG tablet Take 1 tablet by mouth every 4 (four) hours as needed. 12/20/21   [provider]  hydrocortisone sodium succinate (SOLU-CORTEF) 100 MG SOLR injection Inject 100 mg into the muscle daily as needed (when unable to keep down prednisone tablet).     [provider]  ipratropium (ATROVENT) 0.06 % nasal spray Place 2 sprays into both nostrils 3 (three) times daily. 07/28/21   [provider]  Lancets (ONETOUCH DELICA PLUS [id number]) MISC USE TO TEST 3 TIMESUDAILY. 07/01/19   [provider]  leflunomide (ARAVA) 20 MG tablet TAKE 1 TABLET BY MOUTH ONCE DAILY. 11/28/21   Ofilia Neas, PA-C  losartan (COZAAR) 100 MG tablet Take by mouth.    [provider]  metoprolol succinate (TOPROL-XL) 50 MG 24 hr tablet Take 1 tablet (50 mg total) by mouth daily. Take with or immediately following a meal. 07/03/11   Hensel, Jamal Collin, MD  Misc Natural Products (TART CHERRY ADVANCED PO) Take 1 tablet by mouth daily. daily    [provider]  Multiple Vitamin (MULTIVITAMIN) tablet Take 1 tablet by mouth daily.    [provider]  nortriptyline (PAMELOR) 25 MG capsule TAKE (1) CAPSULE BY MOUTH AT BEDTIME. Patient taking differently: Take 25 mg by mouth at bedtime. 12/05/17   Ofilia Neas, PA-C  Omeprazole 20 MG TBEC Take 20 mg by mouth 2 (two) times daily before a meal.    [provider]  ONETOUCH VERIO test strip  03/05/18   [provider]  oxybutynin (DITROPAN-XL) 5 MG 24 hr tablet TAKE 1 TABLET BY MOUTH AT BEDTIME. 02/26/22   Megan Salon, MD  OZEMPIC, 0.25 OR 0.5 MG/DOSE, 2 MG/3ML SOPN SMARTSIG:0.25 Milligram(s) SUB-Q Once a Week 09/26/21   [provider]  pantoprazole (PROTONIX) 40 MG tablet Take 1  tablet (40 mg total) by mouth 2 (two) times daily before a meal. 30 minutes prior 12/14/21   Annitta Needs, NP  Polyethyl Glycol-Propyl Glycol (SYSTANE OP) Apply to eye 4 (four) times daily.    [provider]  potassium chloride SA (K-DUR,KLOR-CON) 20 MEQ tablet Take 20 mEq by mouth daily.     [provider]  predniSONE (DELTASONE) 5 MG tablet TAKE 1 TABLET BY MOUTH DAILY WITH BREAKFAST 01/08/22   Ofilia Neas, PA-C  spironolactone (ALDACTONE) 25 MG tablet Take 25 mg by mouth daily.    [provider]  SURE COMFORT PEN NEEDLES 31G X 5 MM [place]  05/05/18   [provider]  TALTZ 80 MG/ML SOAJ INJECT '80MG'$  (1 PEN) UNDER THE SKIN EVERY 4 WEEKS 08/14/21   Ofilia Neas, PA-C  topiramate (TOPAMAX) 200 MG tablet TAKE (1) TABLET BY MOUTH AT BEDTIME. 11/21/21   Bo Merino, MD  traMADol (ULTRAM) 50 MG tablet Take 50 mg by mouth every 8 (eight) hours as needed. 10/13/21   [provider]  Turmeric 500 MG CAPS Take 500 mg by mouth every morning.    [provider]    Family History Family History  Problem Relation Age of Onset   Diabetes Mother    Pancreatitis Mother    Arthritis Mother        psoriatic    Fibromyalgia Mother    Rheumatic fever Father    Diabetes Other        grandparents   Skin cancer Other        grandfather   Hypertension Other        grandparent   Congestive Heart Failure Other        grandfather   Parkinson's disease Other        grandmother   Colon cancer Maternal Uncle    Fibromyalgia Sister    Rheum arthritis Sister    Diabetes Sister    Fibromyalgia Sister    Diabetes Sister    Rheum arthritis Sister    Hypertension Son    Colon polyps Neg Hx     Social History Social History   Tobacco Use   Smoking status: Never    Passive exposure: Never   Smokeless tobacco: Never  Vaping Use   Vaping Use: Never used  Substance Use Topics   Alcohol  use: No   Drug use: No     Allergies   Adalimumab, Imuran [azathioprine sodium], Methotrexate, Plaquenil [hydroxychloroquine sulfate], Sulfonamide derivatives, Azathioprine, Ceftin [cefuroxime axetil], Cefuroxime, Ezetimibe, Insulin detemir, Lisinopril, Metformin hcl, Morphine sulfate, Pravastatin sodium, Sulfamethoxazole-trimethoprim, and Morphine   Review of Systems Review of Systems Per HPI  Physical Exam Triage Vital Signs ED Triage Vitals  Enc Vitals Group     BP 02/27/22 1534 (!) 184/85     Pulse Rate 02/27/22 1534 (!) 108     Resp 02/27/22 1534 18     Temp 02/27/22 1534 98.4 F (36.9 C)     Temp Source 02/27/22 1534 Oral     SpO2 02/27/22 1534 94 %     Weight --      Height --      Head Circumference --      Peak Flow --      Pain Score 02/27/22 1532 0     Pain Loc --      Pain Edu? --      Excl. in [place]? --    No data found.  Updated Vital  Signs BP (!) 184/85 (BP Location: Right Arm)   Pulse (!) 108   Temp 98.4 F (36.9 C) (Oral)   Resp 18   LMP 02/01/1999   SpO2 94%   Visual Acuity Right Eye Distance:   Left Eye Distance:   Bilateral Distance:    Right Eye Near:   Left Eye Near:    Bilateral Near:     Physical Exam Vitals and nursing note reviewed.  Constitutional:      General: She is not in acute distress.    Appearance: Normal appearance. She is not ill-appearing or toxic-appearing.  HENT:     Head: Normocephalic and atraumatic.     Right Ear: Tympanic membrane, ear canal and external ear normal.     Left Ear: Tympanic membrane, ear canal and external ear normal.     Nose: Nose normal. No congestion or rhinorrhea.     Mouth/Throat:     Mouth: Mucous membranes are moist.     Pharynx: Oropharynx is clear. No oropharyngeal exudate or posterior oropharyngeal erythema.  Eyes:     General: No scleral icterus.    Extraocular Movements: Extraocular movements intact.  Cardiovascular:     Rate and Rhythm: Normal rate and regular rhythm.   Pulmonary:     Effort: Pulmonary effort is normal. No respiratory distress.     Breath sounds: Normal breath sounds. No wheezing, rhonchi or rales.  Abdominal:     General: Abdomen is flat. Bowel sounds are normal. There is no distension.     Palpations: Abdomen is soft.  Musculoskeletal:     Cervical back: Normal range of motion and neck supple.  Lymphadenopathy:     Cervical: No cervical adenopathy.  Skin:    General: Skin is warm and dry.     Coloration: Skin is not jaundiced or pale.     Findings: No erythema or rash.  Neurological:     Mental Status: She is alert and oriented to person, place, and time.  Psychiatric:        Behavior: Behavior normal. Behavior is cooperative.      UC Treatments / Results  Labs (all labs ordered are listed, but only abnormal results are displayed) Labs Reviewed  POCT FASTING CBG [facility] MANUAL ENTRY - Abnormal; Notable for the following components:      Result Value   POCT Glucose ([facility]) 140 (*)    All other components within normal limits  RESP PANEL BY RT-PCR (FLU A&B, COVID) [id number]    EKG   Radiology No results found.  Procedures Procedures (including critical care time)  Medications Ordered in UC Medications - No data to display  Initial Impression / Assessment and Plan / UC Course  I have reviewed the triage vital signs and the nursing notes.  Pertinent labs & imaging results that were available during my care of the patient were reviewed by me and considered in my medical decision making (see chart for details).   Patient is well-appearing, afebrile, not tachypneic, oxygenating well on room air.  She is mildly tachycardic and hypertensive, likely secondary to not taking her daily medication today.   Viral URI with cough Suspect viral upper respiratory infection as cause of symptoms COVID-19, influenza testing obtained    Blood sugar today is not low UA and chest x-ray deferred by patient Supportive care  discussed Encouraged seeking care if symptoms worsen in the meantime  The patient was given the opportunity to ask questions.  All questions answered to their satisfaction.  The patient is in agreement to this plan.    Final Clinical Impressions(s) / UC Diagnoses   Final diagnoses:  Viral URI with cough     Discharge Instructions      It sounds like you have a viral upper respiratory infection.  Symptoms should improve over the next week to 10 days.  If you develop chest pain or shortness of breath, go to the emergency room.  We have tested you today for COVID-19.  You will see the results in Mychart and we will call you with positive results.    Please stay home and isolate until you are aware of the results.    Some things that can make you feel better are: - Increased rest - Increasing fluid with water/sugar free electrolytes - Acetaminophen and ibuprofen as needed for fever/pain - Salt water gargling, chloraseptic spray and throat lozenges - OTC guaifenesin (Mucinex) 600 mg twice daily - Saline sinus flushes or a neti pot - Humidifying the air     ED Prescriptions   None    PDMP not reviewed this encounter.   Eulogio Bear, NP 02/27/22 [phone]

## 2022-03-06 NOTE — Progress Notes (Unsigned)
Office Visit Note  Patient: Tonya Hall             Date of Birth: [birth year]           MRN: [id number]             PCP: [name], MD Referring: [name], MD Visit Date: 03/19/2022 Occupation: '@GUAROCC'$ @  Subjective:  Pain in both hands   History of Present Illness: Tonya Hall is a 71 y.o. female with psoriatic arthritis, osteoarthritis, and fibromyalgia.  Patient is currently on Taltz 80 mg sq injections q 4 weeks, Arava '20mg'$  1 tablet daily, Otezla 30 mg 1 tablet by mouth twice daily, and prednisone 5 mg 1 tablet daily.  She continues to tolerate combination therapy without any side effects.  She has been experiencing increased pain in both hands especially in bilateral third PIP joints.  She has not noticed any increased joint swelling in her hands recently.  She has also had increased pain in both elbow joints.  She denies any warmth or swelling in her elbows at this time.  She denies any Achilles tendinitis or plantar fasciitis.  She continues to have ongoing pain on the lateral aspect of her left hip consistent with trochanteric bursitis.  She states her SI joint pain has been tolerable recently.  She states that she noticed about a 50% improvement in her knee joint pain and instability after undergoing Visco gel injections for both knees in October 2023. She denies any active psoriasis at this time.    Activities of Daily Living:  Patient reports morning stiffness for all day.  Patient Reports nocturnal pain.  Difficulty dressing/grooming: Reports Difficulty climbing stairs: Reports Difficulty getting out of chair: Reports Difficulty using hands for taps, buttons, cutlery, and/or writing: Reports  Review of Systems  Constitutional:  Positive for fatigue.  HENT:  Positive for mouth dryness. Negative for mouth sores.   Eyes:  Positive for dryness.  Respiratory:  Negative for shortness of breath.   Cardiovascular:  Negative for chest pain and palpitations.   Gastrointestinal:  Negative for blood in stool, constipation and diarrhea.  Endocrine: Negative for increased urination.  Genitourinary:  Negative for involuntary urination.  Musculoskeletal:  Positive for joint pain, gait problem, joint pain, joint swelling, myalgias, muscle weakness, morning stiffness, muscle tenderness and myalgias.  Skin:  Positive for color change. Negative for rash, hair loss and sensitivity to sunlight.  Allergic/Immunologic: Negative for susceptible to infections.  Neurological:  Negative for dizziness and headaches.  Hematological:  Negative for swollen glands.  Psychiatric/Behavioral:  Negative for depressed mood and sleep disturbance. The patient is not nervous/anxious.     PMFS History:  Patient Active Problem List   Diagnosis Date Noted   Statin not tolerated 10/23/2021   Chronic allergic conjunctivitis 07/29/2021   Vasomotor rhinitis 07/29/2021   Benign neoplasm of skin of lower limb 05/17/2021   History of neoplasm 05/17/2021   Melanocytic nevi of left upper limb, including shoulder 05/17/2021   Melanocytic nevi of trunk 05/17/2021   Other seborrheic keratosis 05/17/2021   Diarrhea 03/10/2021   Fracture of pelvis ([place]) 01/27/2021   Pain of left hip joint 01/06/2021   Foraminal stenosis of lumbar region 08/03/2020   Cervical spondylosis with myelopathy and radiculopathy 03/09/2020   AKI (acute kidney injury) ([place]) 02/07/2019   Acute respiratory failure with hypoxia ([place]) 02/06/2019   Acute respiratory disease due to COVID-19 virus 02/06/2019   Community acquired pneumonia 02/06/2019   Hyponatremia 02/06/2019  Asthma    Diabetes mellitus without complication ([place])    Closed nondisplaced odontoid fracture with type II morphology ([place]) 01/27/2018   Odontoid fracture ([place]) 01/24/2018   Chest pain 11/14/2017   Transaminitis 06/22/2016   Collagenous colitis 05/23/2016   Psoriasis 05/21/2016   High risk medication use 02/15/2016   Age-related  osteoporosis without current pathological fracture 02/15/2016   Trochanteric bursitis, left hip 02/15/2016   Sacroiliitis, not elsewhere classified ([place]) 02/15/2016   Chronic pain syndrome 02/15/2016   Abnormal weight gain 06/25/2012   Hypertension 07/03/2011   Hypercholesteremia 07/03/2011   Psoriatic arthritis ([place]) 07/03/2011   Osteoarthritis of multiple joints 07/03/2011   Esophageal motility disorder 02/14/2011   Cough 02/14/2011   GERD 05/05/2010    Past Medical History:  Diagnosis Date   Asthma    Albuterol in haler prn   Cataract    immature unsure which eye   Chronic back pain    COVID-19 October/November 2020   Degenerative disk disease    psoriatic   Diabetes mellitus without complication (HCC)    diet controlled   Esophageal motility disorder    Non-specific, see modified barium study/speech path, BP   Fibromyalgia    GERD (gastroesophageal reflux disease)    takes Protonix bid   Headache 2019   History of blood transfusion    post c-section   History of bronchitis    HTN (hypertension)    Hyperlipidemia    takes Pravastatin daily   Lymphocytic colitis 05/26/2010   Responded to Entocort x 3 MOS   Neuropathy    Nonallergic rhinitis    Osteoporosis    gets Boniva every 3 months   Peripheral edema    takes HCTZ daily   Pneumonia 02/2019   Psoriatic arthritis ([place])    Dr. Katherina Right   Psoriatic arthritis ([place])    bilateral hands   Raynaud's disease    Seasonal allergies    Shingles 2020   SUI (stress urinary incontinence, female)    Urinary urgency     Family History  Problem Relation Age of Onset   Diabetes Mother    Pancreatitis Mother    Arthritis Mother        psoriatic    Fibromyalgia Mother    Rheumatic fever Father    Diabetes Other        grandparents   Skin cancer Other        grandfather   Hypertension Other        grandparent   Congestive Heart Failure Other        grandfather   Parkinson's disease Other        grandmother    Colon cancer Maternal Uncle    Fibromyalgia Sister    Rheum arthritis Sister    Diabetes Sister    Fibromyalgia Sister    Diabetes Sister    Rheum arthritis Sister    Hypertension Son    Colon polyps Neg Hx    Past Surgical History:  Procedure Laterality Date   ANTERIOR CERVICAL DECOMP/DISCECTOMY FUSION N/A 03/09/2020   Procedure: ANTERIOR CERVICAL DECOMPRESSION/DISCECTOMY FUSION, INTERBODY PROSTHESIS, PLATE SCREWS CERVICAL FIVE-SIX, CERVICAL SIX-SEVEN;  Surgeon: Newman Pies, MD;  Location: [facility];  Service: Neurosurgery;  Laterality: N/A;   BACK SURGERY  2003   BIOPSY  04/17/2016   Procedure: BIOPSY;  Surgeon: Danie Binder, MD;  [facility];  Service: Endoscopy;;  random colon bx's   CERVICAL SPINE SURGERY  09/09/2017   CESAREAN SECTION  1974,  1978       COLONOSCOPY  06/19/2008   PPI:RJJOA internal hemorrhoids/mild sigmoin colon diverticulosis   COLONOSCOPY  2012   Dr. Gala Romney: normal rectum, diverticula, lymphocytic colitis    COLONOSCOPY WITH PROPOFOL N/A 04/17/2016   Dr. Oneida Alar: Normal terminal ileum, internal/external hemorrhoids, random colon biopsies consistent with collagenous colitis   DILATION AND CURETTAGE OF UTERUS  1977   spontaneous abortion   ESOPHAGOGASTRODUODENOSCOPY  06/19/2008   CZY:SAYTKZSW gastritis   LUMBAR DISC ARTHROPLASTY  09/1996   LUMBAR FUSION  6/03, 11/08, 11/14   L4-5 fusion, L2-3 fusion, L1-2   LUMBAR FUSION  08/03/2020   L4-L5   LUMBAR LAMINECTOMY  03/2020   MOUTH SURGERY  12/07/2021   oral surgery   ODONTOID SCREW INSERTION N/A 01/27/2018   Procedure: ODONTOID SCREW INSERTION;  Surgeon: Newman Pies, MD;  Location: [facility];  Service: Neurosurgery;  Laterality: N/A;  ODONTOID SCREW INSERTION   TONSILLECTOMY     TONSILLECTOMY AND ADENOIDECTOMY     TUBAL LIGATION     Social History   Social History Narrative   ATTENDS [employer]. 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   Immunization History  Administered Date(s)  Administered   Influenza,inj,Quad PF,6+ Mos 01/21/2017   Influenza-Unspecified 01/30/2017   Moderna Sars-Covid-2 Vaccination 06/01/2019, 07/02/2019   Pneumococcal Polysaccharide-23 02/19/2013   Tdap 09/10/2016     Objective: Vital Signs: BP (!) 153/77 (BP Location: Left Arm, Patient Position: Sitting, Cuff Size: Normal)   Pulse 90   Resp 14   Ht '4\' 11"'$  (1.499 m)   Wt 125 lb 6.4 oz (56.9 kg)   LMP 02/01/1999   BMI 25.33 kg/m    Physical Exam Vitals and nursing note reviewed.  Constitutional:      Appearance: She is well-developed.  HENT:     Head: Normocephalic and atraumatic.  Eyes:     Conjunctiva/sclera: Conjunctivae normal.  Cardiovascular:     Rate and Rhythm: Normal rate and regular rhythm.     Heart sounds: Normal heart sounds.  Pulmonary:     Effort: Pulmonary effort is normal.     Breath sounds: Normal breath sounds.  Abdominal:     General: Bowel sounds are normal.     Palpations: Abdomen is soft.  Musculoskeletal:     Cervical back: Normal range of motion.  Skin:    General: Skin is warm and dry.     Capillary Refill: Capillary refill takes less than 2 seconds.  Neurological:     Mental Status: She is alert and oriented to person, place, and time.  Psychiatric:        Behavior: Behavior normal.      Musculoskeletal Exam: C-spine has limited ROM. Thoracic kyphosis.  Painful ROM of thoracic and lumbar spine.  Midline spinal tenderness in thoracic and lumbar region.  Left SI joint tenderness.  Shoulder joints have good ROM with tenderness bilaterally.  Tenderness over the elbow joint line bilaterally.  Severe PIP and DIP thickening.  Tenderness over bilateral 3rd PIP joints.  Thickening of both CMC joints. No synovitis or dactylitis noted. Ganglion cyst on radial aspect of left wrist.  Limited ROM of both hip joints. Tenderness over the left trochanteric bursa.  Knee joints have good ROM with no warmth or effusion.  Ankle joints have good ROM with no tenderness  or synovitis.  No evidence of achilles tendonitis or plantar fasciitis.   CDAI Exam: CDAI Score: -- Patient Global: --; Provider Global: -- Swollen: 0 ; Tender: 6  Joint Exam 03/19/2022  Right  Left  Glenohumeral   Tender   Tender  Elbow   Tender   Tender  PIP 3   Tender   Tender     Investigation: No additional findings.  Imaging: No results found.  Recent Labs: Lab Results  Component Value Date   WBC 12.0 (H) 12/18/2021   HGB 12.5 12/18/2021   PLT 245 12/18/2021   NA 139 12/18/2021   K 3.4 (L) 12/18/2021   CL 107 12/18/2021   CO2 23 12/18/2021   GLUCOSE 101 (H) 12/18/2021   BUN 23 12/18/2021   CREATININE 0.79 12/18/2021   BILITOT 0.2 12/18/2021   ALKPHOS 70 12/21/2020   AST 22 12/18/2021   ALT 23 12/18/2021   PROT 6.3 12/18/2021   ALBUMIN 4.4 12/21/2020   CALCIUM 9.4 12/18/2021   GFRAA 65 06/08/2020   QFTBGOLD Negative 05/05/2015   QFTBGOLDPLUS NEGATIVE 09/20/2021    Speciality Comments: MTX- high LFTs, PLQ,SSZ,Imuran, Humira- allergy, Enbrel , Remicade - inadequate response  Procedures:  No procedures performed Allergies: Adalimumab, Imuran [azathioprine sodium], Methotrexate, Plaquenil [hydroxychloroquine sulfate], Sulfonamide derivatives, Azathioprine, Ceftin [cefuroxime axetil], Cefuroxime, Ezetimibe, Insulin detemir, Lisinopril, Metformin hcl, Morphine sulfate, Pravastatin sodium, Sulfamethoxazole-trimethoprim, and Morphine   Assessment / Plan:     Visit Diagnoses: Psoriatic arthritis ([place]): She has no synovitis or dactylitis on examination today.  She continues to experience arthralgias and joint stiffness due to underlying osteoarthritis and previous joint damage.  She has no active inflammation on examination today.  No evidence of Achilles tendinitis or plantar fasciitis.  No active psoriasis at this time.  She has been experiencing increased pain in both hands especially in bilateral third PIP joints but no active inflammation was noted.  Offered  referral to pain management and she was in agreement.  Offered a referral to hand specialist but she declined at this time.  Overall her psoriatic arthritis remains well-controlled on triple therapy: Taltz 80 mg subcutaneous injections every 4 weeks, Arava 20 mg 1 tablet daily, and Otezla 30 mg 1 tablet twice daily.  She also remains on prednisone 5 mg daily.  No medication changes will be made at this time.  She was advised to notify us if she develops signs or symptoms of a flare.  She will follow-up in the office in 3 months or sooner if needed.  Psoriasis: She has no active psoriasis currently.  High risk medication use - Taltz 80 mg sq injections q 4 weeks, Arava '20mg'$  1 tablet daily, Otezla 30 mg 1 tablet by mouth twice daily, and prednisone 5 mg 1 tablet daily. - Plan: COMPLETE METABOLIC PANEL WITH GFR, CBC with Differential/Platelet CBC and CMP were drawn on 12/18/2021.  Orders for CBC and CMP were released today.  Her next lab work will be due in March and every 3 months to monitor for drug toxicity. TB Gold negative on 09/11/2021. Patient was seen at urgent care on 02/27/2022 for viral URI.  She held arava until her infection had completely cleared.  Discussed the importance of holding [place] if she develops signs or symptoms of an infection and to resume once the infection has completely cleared.  Chronic left SI joint pain: Chronic pain.  Cortisone injection performed on 11/01/2021.  Trochanteric bursitis of both hips: Chronic pain.  She has tenderness palpation over bilateral trochanteric bursa, left greater than right.  She had a cortisone injection performed on 06/19/2021.  Primary osteoarthritis of both knees: Good range of motion of both knee joints on examination today.  Crepitus intermittently.  No warmth or effusion noted on exam.  Patient had Visco gel injections in both knees performed in October 2023 which improved her symptoms by 50%.  She is no longer experiencing buckling or  instability.  DDD (degenerative disc disease), cervical: Limited range of motion especially with lateral rotation.  DDD (degenerative disc disease), thoracic - Followed by Dr. Arnoldo Morale.  Thoracic spine degenerative changes similar to 2021 noted on MRI from 09/19/2021.  Thoracic kyphosis and midline spinal tenderness in the lumbar region noted.  Patient plans on following back up for repeat injection in the future.  Referral to pain management was placed today.  DDD (degenerative disc disease), lumbar: Chronic pain.  Referral to pain management was placed today.  Raynaud's syndrome without gangrene: Not currently active.    Age-related osteoporosis without current pathological fracture - Followed by Dr. Chalmers Cater.  She remains on Prolia 60 mg sq injections every 6 months.  She is taking a calcium and vitamin D supplement daily.   Chronic pain syndrome: Offered a referral to Dr. Dagoberto Ligas for pain management.  Referral placed today.   Other medical conditions are listed as follows:   Essential hypertension: BP was 153/77 today in the office (rechecked).  Advised patient to monitor blood pressure closely and to follow up with her PCP if her blood pressure remains elevated.   Esophageal motility disorder  History of type 2 diabetes mellitus  Adrenal insufficiency (HCC)  History of gastroesophageal reflux (GERD)  Collagenous colitis  Senile purpura ([place])  Orders: Orders Placed This Encounter  Procedures   COMPLETE METABOLIC PANEL WITH GFR   CBC with Differential/Platelet   No orders of the defined types were placed in this encounter.   Follow-Up Instructions: Return in about 3 months (around 06/18/2022) for Psoriatic arthritis, Osteoarthritis, Fibromyalgia.   Ofilia Neas, PA-C  Note - This record has been created using Dragon software.  Chart creation errors have been sought, but may not always  have been located. Such creation errors do not reflect on  the standard of medical care.

## 2022-03-10 ENCOUNTER — Other Ambulatory Visit: Payer: Self-pay | Admitting: Physician Assistant

## 2022-03-12 ENCOUNTER — Other Ambulatory Visit: Payer: Self-pay | Admitting: Rheumatology

## 2022-03-12 NOTE — Telephone Encounter (Signed)
Next Visit: 03/19/2022  Last Visit: 12/18/2021  Last Fill: 11/28/2021  DX: Psoriatic arthritis   Current Dose per office note on 12/18/2021: Arava '20mg'$  1 tablet daily,   Labs: 12/18/2021 CBC stable.  Potassium is borderline low-3.4. Please clarify if she has had any diarrhea/vomiting? Recommend repeating BMP with PCP. rest of CMP WNL.     Okay to refill arava?

## 2022-03-12 NOTE — Telephone Encounter (Signed)
Next Visit: 03/19/2022  Last Visit: 12/18/2021  Last Fill: 11/21/2021  Current Dose per office note on 12/18/2021: not mentioned.   Okay to refill topamax?

## 2022-03-15 ENCOUNTER — Encounter: Payer: Self-pay | Admitting: Gastroenterology

## 2022-03-15 ENCOUNTER — Ambulatory Visit (INDEPENDENT_AMBULATORY_CARE_PROVIDER_SITE_OTHER): Payer: PPO | Admitting: Gastroenterology

## 2022-03-15 ENCOUNTER — Ambulatory Visit (INDEPENDENT_AMBULATORY_CARE_PROVIDER_SITE_OTHER): Payer: PPO | Admitting: *Deleted

## 2022-03-15 VITALS — BP 157/76 | HR 89 | Temp 97.4°F | Ht 59.0 in | Wt 126.4 lb

## 2022-03-15 DIAGNOSIS — M2042 Other hammer toe(s) (acquired), left foot: Secondary | ICD-10-CM | POA: Diagnosis not present

## 2022-03-15 DIAGNOSIS — M2012 Hallux valgus (acquired), left foot: Secondary | ICD-10-CM | POA: Diagnosis not present

## 2022-03-15 DIAGNOSIS — M2041 Other hammer toe(s) (acquired), right foot: Secondary | ICD-10-CM | POA: Diagnosis not present

## 2022-03-15 DIAGNOSIS — K219 Gastro-esophageal reflux disease without esophagitis: Secondary | ICD-10-CM | POA: Diagnosis not present

## 2022-03-15 DIAGNOSIS — E119 Type 2 diabetes mellitus without complications: Secondary | ICD-10-CM

## 2022-03-15 DIAGNOSIS — M2011 Hallux valgus (acquired), right foot: Secondary | ICD-10-CM | POA: Diagnosis not present

## 2022-03-15 NOTE — Patient Instructions (Signed)
I am glad you are doing better!  Continue pantoprazole twice a day, 30 minutes before breakfast and dinner.  Please call if further issues.  Have a Merry Christmas!  See you in 6 months or sooner if needed!  I enjoyed seeing you again today! As you know, I value our relationship and want to provide genuine, compassionate, and quality care. I welcome your feedback. If you receive a survey regarding your visit,  I greatly appreciate you taking time to fill this out. See you next time!  Annitta Needs, PhD, ANP-BC [facility]

## 2022-03-15 NOTE — Progress Notes (Signed)
Gastroenterology Office Note     Primary Care Physician:  [name], MD  Primary Gastroenterologist: Dr. Abbey Chatters   Chief Complaint   Chief Complaint  Patient presents with   Follow-up    Follow up on GERD     History of Present Illness   Tonya Hall is a 71 y.o. female presenting today in follow-up with a history of collagenous colitis, GERD, and gastritis.  Originally diagnosed with lymphocytic colitis on colonoscopy in 2012.  Last colonoscopy in 2018, normal terminal ileum, hemorrhoids, random colon biopsies consistent with collagenous colitis.  She has been on low-dose prednisone for years for psoriatic arthritis, unable to be weaned off due to adrenal insufficiency, followed by Dr. Soyla Murphy with endocrinology.  Barium pill esophagram in 2012 showed diffuse impairment of esophageal motility with incomplete clearance of barium by primary peristaltic waves.  Numerous secondary and tertiary waves noted.  Modified barium swallow study in July 2012 showed flash laryngeal penetration of liquid component with swallowing of pills, flash laryngeal penetration without aspiration or cough with thin barium by cup.   Last seen in Sept 2023 with refractory GERD. Stopped omeprazole and started pantoprazole BID.   Returning today with improvement in symptoms. Less nocturnal symptoms. No dysphagia. She has HOB on risers but will be increasing this slightly more. 3 episodes since last seen. She got back on Ozempic and hasn't noticed a huge worsening in symptoms. Avoiding eating late at night.      Past Medical History:  Diagnosis Date   Asthma    Albuterol in haler prn   Cataract    immature unsure which eye   Chronic back pain    COVID-19 October/November 2020   Degenerative disk disease    psoriatic   Diabetes mellitus without complication (HCC)    diet controlled   Esophageal motility disorder    Non-specific, see modified barium study/speech path, BP   Fibromyalgia    GERD  (gastroesophageal reflux disease)    takes Protonix bid   Headache 2019   History of blood transfusion    post c-section   History of bronchitis    HTN (hypertension)    Hyperlipidemia    takes Pravastatin daily   Lymphocytic colitis 05/26/2010   Responded to Entocort x 3 MOS   Neuropathy    Nonallergic rhinitis    Osteoporosis    gets Boniva every 3 months   Peripheral edema    takes HCTZ daily   Pneumonia 02/2019   Psoriatic arthritis ([place])    Dr. Katherina Right   Psoriatic arthritis [facility])    bilateral hands   Raynaud's disease    Seasonal allergies    Shingles 2020   SUI (stress urinary incontinence, female)    Urinary urgency     Past Surgical History:  Procedure Laterality Date   ANTERIOR CERVICAL DECOMP/DISCECTOMY FUSION N/A 03/09/2020   Procedure: ANTERIOR CERVICAL DECOMPRESSION/DISCECTOMY FUSION, INTERBODY PROSTHESIS, PLATE SCREWS CERVICAL FIVE-SIX, CERVICAL SIX-SEVEN;  Surgeon: Newman Pies, MD;  Location: [place];  Service: Neurosurgery;  Laterality: N/A;   BACK SURGERY  2003   BIOPSY  04/17/2016   Procedure: BIOPSY;  Surgeon: Danie Binder, MD;  [facility];  Service: Endoscopy;;  random colon bx's   CERVICAL SPINE SURGERY  09/09/2017   CESAREAN SECTION  1974, 1978       COLONOSCOPY  06/19/2008   VEL:FYBOF internal hemorrhoids/mild sigmoin colon diverticulosis   COLONOSCOPY  2012   Dr. Gala Romney: normal rectum, diverticula,  lymphocytic colitis    COLONOSCOPY WITH PROPOFOL N/A 04/17/2016   Dr. Oneida Alar: Normal terminal ileum, internal/external hemorrhoids, random colon biopsies consistent with collagenous colitis   DILATION AND CURETTAGE OF UTERUS  1977   spontaneous abortion   ESOPHAGOGASTRODUODENOSCOPY  06/19/2008   EXH:[id number] gastritis   LUMBAR DISC ARTHROPLASTY  09/1996   LUMBAR FUSION  6/03, 11/08, 11/14   L4-5 fusion, L2-3 fusion, L1-2   LUMBAR FUSION  08/03/2020   L4-L5   LUMBAR LAMINECTOMY  03/2020   MOUTH SURGERY  12/07/2021   oral  surgery   ODONTOID SCREW INSERTION N/A 01/27/2018   Procedure: ODONTOID SCREW INSERTION;  Surgeon: Newman Pies, MD;  Location: [place];  Service: Neurosurgery;  Laterality: N/A;  ODONTOID SCREW INSERTION   TONSILLECTOMY     TONSILLECTOMY AND ADENOIDECTOMY     TUBAL LIGATION      Current Outpatient Medications  Medication Sig Dispense Refill   albuterol (PROVENTIL HFA;VENTOLIN HFA) 108 (90 BASE) MCG/ACT inhaler Inhale 2 puffs into the lungs every 6 (six) hours as needed for wheezing or shortness of breath.      Apremilast (OTEZLA) 30 MG TABS Take 1 tablet (30 mg total) by mouth 2 (two) times daily. 180 tablet 0   aspirin 81 MG tablet Take 81 mg by mouth daily.     azelastine (ASTELIN) 0.1 % nasal spray Place 1 spray into both nostrils 2 (two) times daily.     azelastine (OPTIVAR) 0.05 % ophthalmic solution Apply 1 drop to eye 2 (two) times daily.     Calcium Carb-Cholecalciferol (CALCIUM 600 + D PO) Take 1 tablet by mouth 2 (two) times daily.      Cetirizine HCl 10 MG CAPS Take by oral route.     chlorhexidine (PERIDEX) 0.12 % solution SMARTSIG:0.5 Ounce(s) By Mouth Twice Daily     denosumab (PROLIA) 60 MG/ML SOSY injection Inject 60 mg into the skin every 6 (six) months.     Evolocumab (REPATHA SURECLICK) 938 MG/ML SOAJ Inject 1 Dose into the skin every 14 (fourteen) days. 6 mL 3   fish oil-omega-3 fatty acids 1000 MG capsule Take 1 g by mouth daily.     gabapentin (NEURONTIN) 600 MG tablet Take 600 mg by mouth 3 (three) times daily.     Ginger, Zingiber officinalis, (GINGER ROOT) 500 MG CAPS Take 500 mg by mouth daily.     Glucosamine Sulfate 1000 MG CAPS Take 2,000 mg by mouth daily.     HUMULIN N KWIKPEN 100 UNIT/ML Kiwkpen Inject 10 Units into the skin at bedtime.     HYDROcodone-acetaminophen (NORCO/VICODIN) 5-325 MG tablet Take 1 tablet by mouth every 4 (four) hours as needed.     hydrocortisone sodium succinate (SOLU-CORTEF) 100 MG SOLR injection Inject 100 mg into the muscle  daily as needed (when unable to keep down prednisone tablet).      Lancets (ONETOUCH DELICA PLUS [id number]) MISC USE TO TEST 3 TIMESUDAILY.     leflunomide (ARAVA) 20 MG tablet TAKE 1 TABLET BY MOUTH ONCE DAILY. 90 tablet 0   losartan (COZAAR) 100 MG tablet Take by mouth.     metoprolol succinate (TOPROL-XL) 50 MG 24 hr tablet Take 1 tablet (50 mg total) by mouth daily. Take with or immediately following a meal.     Misc Natural Products (TART CHERRY ADVANCED PO) Take 1 tablet by mouth daily. daily     Multiple Vitamin (MULTIVITAMIN) tablet Take 1 tablet by mouth daily.     nortriptyline (PAMELOR) 25 MG  capsule TAKE (1) CAPSULE BY MOUTH AT BEDTIME. (Patient taking differently: Take 25 mg by mouth at bedtime.) 30 capsule 0   ONETOUCH VERIO test strip      oxybutynin (DITROPAN-XL) 5 MG 24 hr tablet TAKE 1 TABLET BY MOUTH AT BEDTIME. 90 tablet 0   OZEMPIC, 0.25 OR 0.5 MG/DOSE, 2 MG/3ML SOPN SMARTSIG:0.25 Milligram(s) SUB-Q Once a Week     pantoprazole (PROTONIX) 40 MG tablet Take 1 tablet (40 mg total) by mouth 2 (two) times daily before a meal. 30 minutes prior 180 tablet 3   Polyethyl Glycol-Propyl Glycol (SYSTANE OP) Apply to eye 4 (four) times daily.     potassium chloride SA (K-DUR,KLOR-CON) 20 MEQ tablet Take 20 mEq by mouth daily.      predniSONE (DELTASONE) 5 MG tablet TAKE 1 TABLET BY MOUTH DAILY WITH BREAKFAST 90 tablet 0   spironolactone (ALDACTONE) 25 MG tablet Take 25 mg by mouth daily.     SURE COMFORT PEN NEEDLES 31G X 5 MM MISC      TALTZ 80 MG/ML SOAJ INJECT '80MG'$  (1 PEN) UNDER THE SKIN EVERY 4 WEEKS 4 mL 0   topiramate (TOPAMAX) 200 MG tablet TAKE (1) TABLET BY MOUTH ONCE AT BEDTIME. 90 tablet 0   traMADol (ULTRAM) 50 MG tablet Take 50 mg by mouth every 8 (eight) hours as needed.     Turmeric 500 MG CAPS Take 500 mg by mouth every morning.     No current facility-administered medications for this visit.    Allergies as of 03/15/2022 - Review Complete 03/15/2022  Allergen  Reaction Noted   Adalimumab Rash and Other (See Comments) 05/09/2021   Imuran [azathioprine sodium] Rash 07/03/2011   Methotrexate Other (See Comments) 05/09/2021   Plaquenil [hydroxychloroquine sulfate] Rash 07/03/2011   Sulfonamide derivatives Rash    Azathioprine  05/09/2021   Ceftin [cefuroxime axetil] Nausea Only 07/06/2014   Cefuroxime  05/09/2021   Ezetimibe  09/22/2021   Insulin detemir  09/22/2021   Lisinopril  09/22/2021   Metformin hcl  09/22/2021   Morphine sulfate  05/09/2021   Pravastatin sodium  09/22/2021   Sulfamethoxazole-trimethoprim  09/22/2021   Morphine Nausea And Vomiting     Family History  Problem Relation Age of Onset   Diabetes Mother    Pancreatitis Mother    Arthritis Mother        psoriatic    Fibromyalgia Mother    Rheumatic fever Father    Diabetes Other        grandparents   Skin cancer Other        grandfather   Hypertension Other        grandparent   Congestive Heart Failure Other        grandfather   Parkinson's disease Other        grandmother   Colon cancer Maternal Uncle    Fibromyalgia Sister    Rheum arthritis Sister    Diabetes Sister    Fibromyalgia Sister    Diabetes Sister    Rheum arthritis Sister    Hypertension Son    Colon polyps Neg Hx     Social History   Socioeconomic History   Marital status: Married    Spouse name: Not on file   Number of children: Not on file   Years of education: Not on file   Highest education level: Not on file  Occupational History   Occupation: Case Dietitian    Employer: [employer]  Tobacco Use   Smoking status: Never    Passive exposure: Never   Smokeless tobacco: Never  Vaping Use   Vaping Use: Never used  Substance and Sexual Activity   Alcohol use: No   Drug use: No   Sexual activity: Not Currently    Partners: Male    Birth control/protection: Surgical    Comment: BTL  Other Topics Concern   Not on file  Social History Narrative   ATTENDS  [employer]. 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   Social Determinants of Health   Financial Resource Strain: Not on file  Food Insecurity: Not on file  Transportation Needs: Not on file  Physical Activity: Not on file  Stress: Not on file  Social Connections: Not on file  Intimate Partner Violence: Not on file     Review of Systems   Gen: Denies any fever, chills, fatigue, weight loss, lack of appetite.  CV: Denies chest pain, heart palpitations, peripheral edema, syncope.  Resp: Denies shortness of breath at rest or with exertion. Denies wheezing or cough.  GI: Denies dysphagia or odynophagia. Denies jaundice, hematemesis, fecal incontinence. GU : Denies urinary burning, urinary frequency, urinary hesitancy MS: Denies joint pain, muscle weakness, cramps, or limitation of movement.  Derm: Denies rash, itching, dry skin Psych: Denies depression, anxiety, memory loss, and confusion Heme: Denies bruising, bleeding, and enlarged lymph nodes.   Physical Exam   BP (!) 157/76   Pulse 89   Temp (!) 97.4 F (36.3 C)   Ht '4\' 11"'$  (1.499 m)   Wt 126 lb 6.4 oz (57.3 kg)   LMP 02/01/1999   BMI 25.53 kg/m  General:   Alert and oriented. Pleasant and cooperative. Well-nourished and well-developed.  Head:  Normocephalic and atraumatic. Eyes:  Without icterus Abdomen:  +BS, soft, non-tender and non-distended. No HSM noted. No guarding or rebound. No masses appreciated.  Rectal:  Deferred  Msk:  Symmetrical without gross deformities. Normal posture. Extremities:  Without edema. Neurologic:  Alert and  oriented x4;  grossly normal neurologically. Skin:  Intact without significant lesions or rashes. Psych:  Alert and cooperative. Normal mood and affect.   Assessment   DANAJA LASOTA is a 71 y.o. female presenting today in follow-up with a history of 71 y.o. female presenting today in follow-up with a history of collagenous colitis, GERD, and gastritis. Returning today in follow-up  for GERD>  GERD: improvement with stopping omeprazole and starting pantoprazole BID, diet/behavior modifications.     PLAN    Continue pantoprazole BID HOB On risers Return in 6 months or sooner if needed   Annitta Needs, PhD, ANP-BC [facility]

## 2022-03-15 NOTE — Progress Notes (Signed)
Patient presents today to pick up diabetic shoes and insoles.  Patient was dispensed 1 pair of diabetic shoes and 3 pairs of foam casted diabetic insoles. She tried on the shoes with the insoles and the fit was satisfactory.   Will follow up with Dr Elisha Ponder next year for new order.

## 2022-03-19 ENCOUNTER — Ambulatory Visit: Payer: PPO | Attending: Physician Assistant | Admitting: Physician Assistant

## 2022-03-19 ENCOUNTER — Encounter: Payer: Self-pay | Admitting: Physician Assistant

## 2022-03-19 VITALS — BP 153/77 | HR 90 | Resp 14 | Ht 59.0 in | Wt 125.4 lb

## 2022-03-19 DIAGNOSIS — M170 Bilateral primary osteoarthritis of knee: Secondary | ICD-10-CM | POA: Diagnosis not present

## 2022-03-19 DIAGNOSIS — M533 Sacrococcygeal disorders, not elsewhere classified: Secondary | ICD-10-CM | POA: Diagnosis not present

## 2022-03-19 DIAGNOSIS — M5030 Other cervical disc degeneration, unspecified cervical region: Secondary | ICD-10-CM | POA: Diagnosis not present

## 2022-03-19 DIAGNOSIS — K52831 Collagenous colitis: Secondary | ICD-10-CM

## 2022-03-19 DIAGNOSIS — L4050 Arthropathic psoriasis, unspecified: Secondary | ICD-10-CM

## 2022-03-19 DIAGNOSIS — G894 Chronic pain syndrome: Secondary | ICD-10-CM

## 2022-03-19 DIAGNOSIS — M7061 Trochanteric bursitis, right hip: Secondary | ICD-10-CM | POA: Diagnosis not present

## 2022-03-19 DIAGNOSIS — L409 Psoriasis, unspecified: Secondary | ICD-10-CM

## 2022-03-19 DIAGNOSIS — I10 Essential (primary) hypertension: Secondary | ICD-10-CM | POA: Diagnosis not present

## 2022-03-19 DIAGNOSIS — Z8639 Personal history of other endocrine, nutritional and metabolic disease: Secondary | ICD-10-CM

## 2022-03-19 DIAGNOSIS — M5134 Other intervertebral disc degeneration, thoracic region: Secondary | ICD-10-CM | POA: Diagnosis not present

## 2022-03-19 DIAGNOSIS — E2740 Unspecified adrenocortical insufficiency: Secondary | ICD-10-CM

## 2022-03-19 DIAGNOSIS — D692 Other nonthrombocytopenic purpura: Secondary | ICD-10-CM

## 2022-03-19 DIAGNOSIS — I7300 Raynaud's syndrome without gangrene: Secondary | ICD-10-CM

## 2022-03-19 DIAGNOSIS — M5136 Other intervertebral disc degeneration, lumbar region: Secondary | ICD-10-CM

## 2022-03-19 DIAGNOSIS — G8929 Other chronic pain: Secondary | ICD-10-CM

## 2022-03-19 DIAGNOSIS — K224 Dyskinesia of esophagus: Secondary | ICD-10-CM

## 2022-03-19 DIAGNOSIS — M7062 Trochanteric bursitis, left hip: Secondary | ICD-10-CM

## 2022-03-19 DIAGNOSIS — Z79899 Other long term (current) drug therapy: Secondary | ICD-10-CM | POA: Diagnosis not present

## 2022-03-19 DIAGNOSIS — M810 Age-related osteoporosis without current pathological fracture: Secondary | ICD-10-CM

## 2022-03-19 DIAGNOSIS — Z8719 Personal history of other diseases of the digestive system: Secondary | ICD-10-CM

## 2022-03-19 NOTE — Addendum Note (Signed)
Addended by: Francis Gaines C on: 03/19/2022 12:53 PM   Modules accepted: Orders

## 2022-03-19 NOTE — Progress Notes (Signed)
CBC stable. Patient remains on long term prednisone.

## 2022-03-19 NOTE — Patient Instructions (Signed)
Standing Labs We placed an order today for your standing lab work.   Please have your standing labs drawn in March and every 3 months  Please have your labs drawn 2 weeks prior to your appointment so that the provider can discuss your lab results at your appointment.  Please note that you may see your imaging and lab results in MyChart before we have reviewed them. We will contact you once all results are reviewed. Please allow our office up to 72 hours to thoroughly review all of the results before contacting the office for clarification of your results.  Lab hours are:   Monday through Thursday from 8:00 am -12:30 pm and 1:00 pm-5:00 pm and Friday from 8:00 am-12:00 pm.  Please be advised, all patients with office appointments requiring lab work will take precedent over walk-in lab work.   Labs are drawn by [facility]. Please bring your co-pay at the time of your lab draw.  You may receive a bill from [facility] for your lab work.  Please note if you are on Hydroxychloroquine and and an order has been placed for a Hydroxychloroquine level, you will need to have it drawn 4 hours or more after your last dose.  If you wish to have your labs drawn at another location, please call the office 24 hours in advance so we can fax the orders.  The office is located at [address], [address], [place], [place] [address] No appointment is necessary.    If you have any questions regarding directions or hours of operation,  please call [phone].   As a reminder, please drink plenty of water prior to coming for your lab work. Thanks!  

## 2022-03-20 LAB — CBC WITH DIFFERENTIAL/PLATELET
Absolute Monocytes: 767 cells/uL (ref 200–950)
Basophils Absolute: 91 cells/uL (ref 0–200)
Basophils Relative: 0.7 %
Eosinophils Absolute: 585 cells/uL — ABNORMAL HIGH (ref 15–500)
Eosinophils Relative: 4.5 %
HCT: 37.1 % (ref 35.0–45.0)
Hemoglobin: 12.3 g/dL (ref 11.7–15.5)
Lymphs Abs: 2821 cells/uL (ref 850–3900)
MCH: 31.3 pg (ref 27.0–33.0)
MCHC: 33.2 g/dL (ref 32.0–36.0)
MCV: 94.4 fL (ref 80.0–100.0)
MPV: 11.8 fL (ref 7.5–12.5)
Monocytes Relative: 5.9 %
Neutro Abs: 8736 cells/uL — ABNORMAL HIGH (ref 1500–7800)
Neutrophils Relative %: 67.2 %
Platelets: 253 10*3/uL (ref 140–400)
RBC: 3.93 10*6/uL (ref 3.80–5.10)
RDW: 12.1 % (ref 11.0–15.0)
Total Lymphocyte: 21.7 %
WBC: 13 10*3/uL — ABNORMAL HIGH (ref 3.8–10.8)

## 2022-03-20 LAB — COMPLETE METABOLIC PANEL WITH GFR
AG Ratio: 1.8 (calc) (ref 1.0–2.5)
ALT: 29 U/L (ref 6–29)
AST: 19 U/L (ref 10–35)
Albumin: 4.1 g/dL (ref 3.6–5.1)
Alkaline phosphatase (APISO): 51 U/L (ref 37–153)
BUN/Creatinine Ratio: 26 (calc) — ABNORMAL HIGH (ref 6–22)
BUN: 26 mg/dL — ABNORMAL HIGH (ref 7–25)
CO2: 23 mmol/L (ref 20–32)
Calcium: 11 mg/dL — ABNORMAL HIGH (ref 8.6–10.4)
Chloride: 106 mmol/L (ref 98–110)
Creat: 1.01 mg/dL — ABNORMAL HIGH (ref 0.60–1.00)
Globulin: 2.3 g/dL (calc) (ref 1.9–3.7)
Glucose, Bld: 96 mg/dL (ref 65–99)
Potassium: 3.5 mmol/L (ref 3.5–5.3)
Sodium: 140 mmol/L (ref 135–146)
Total Bilirubin: 0.2 mg/dL (ref 0.2–1.2)
Total Protein: 6.4 g/dL (ref 6.1–8.1)
eGFR: 59 mL/min/{1.73_m2} — ABNORMAL LOW (ref >=60)

## 2022-03-20 NOTE — Progress Notes (Signed)
Creatinine is borderline elevated-1.01 and GFR is borderline low-59. >60 is WNL.   Please advise patient to remain hydrated and to avoid NSAID use.  Calcium is elevated-11.0.  please advise patient to hold calcium supplement at this time.

## 2022-03-21 DIAGNOSIS — E7849 Other hyperlipidemia: Secondary | ICD-10-CM | POA: Diagnosis not present

## 2022-03-21 NOTE — Addendum Note (Signed)
Addended by: Levonne Hubert on: 03/21/2022 11:07 AM   Modules accepted: Orders

## 2022-03-22 LAB — NMR, LIPOPROFILE
Cholesterol, Total: 179 mg/dL (ref 100–199)
HDL Particle Number: 34.1 umol/L (ref >=30.5)
HDL-C: 41 mg/dL (ref >39)
LDL Particle Number: 840 nmol/L (ref <1000)
LDL Size: 20.1 nm — ABNORMAL LOW (ref >20.5)
LDL-C (NIH Calc): 78 mg/dL (ref 0–99)
LP-IR Score: 71 — ABNORMAL HIGH (ref <=45)
Small LDL Particle Number: 502 nmol/L (ref <=527)
Triglycerides: 375 mg/dL — ABNORMAL HIGH (ref 0–149)

## 2022-03-28 ENCOUNTER — Encounter: Payer: Self-pay | Admitting: Medical

## 2022-03-28 ENCOUNTER — Telehealth: Payer: Self-pay | Admitting: Internal Medicine

## 2022-03-28 ENCOUNTER — Ambulatory Visit: Payer: PPO | Attending: Internal Medicine | Admitting: Medical

## 2022-03-28 VITALS — BP 146/62 | HR 79 | Ht <= 58 in | Wt 125.4 lb

## 2022-03-28 DIAGNOSIS — R931 Abnormal findings on diagnostic imaging of heart and coronary circulation: Secondary | ICD-10-CM | POA: Diagnosis not present

## 2022-03-28 DIAGNOSIS — E785 Hyperlipidemia, unspecified: Secondary | ICD-10-CM

## 2022-03-28 DIAGNOSIS — I10 Essential (primary) hypertension: Secondary | ICD-10-CM | POA: Diagnosis not present

## 2022-03-28 NOTE — Patient Instructions (Signed)
Medication Instructions:   Your physician recommends that you continue on your current medications as directed. Please refer to the Current Medication list given to you today.  *If you need a refill on your cardiac medications before your next appointment, please call your pharmacy*  Lab Work:  NONE ordered at this time of appointment   If you have labs (blood work) drawn today and your tests are completely normal, you will receive your results only by: [place] (if you have MyChart) OR A paper copy in the mail If you have any lab test that is abnormal or we need to change your treatment, we will call you to review the results.  Testing/Procedures: NONE ordered at this time of appointment   Follow-Up: At [facility], you and your health needs are our priority.  As part of our continuing mission to provide you with exceptional heart care, we have created designated Provider Care Teams.  These Care Teams include your primary Cardiologist (physician) and Advanced Practice Providers (APPs -  Physician Assistants and Nurse Practitioners) who all work together to provide you with the care you need, when you need it.  Your next appointment:   3 month(s) 6 month(s)   The format for your next appointment:   In Person In Person or virtual   Provider:   Cadence Kathlen Mody, PA-C  [facility]   Other Instructions  Important Information About Sugar

## 2022-03-28 NOTE — Progress Notes (Signed)
Cardiology Office Note:    Date:  03/28/2022   ID:  Tonya Hall, DOB 01-Oct-1950, MRN [id number]  PCP:  [name], MD  [facility] Cardiologist:  None  [facility] Electrophysiologist:  None   Referring MD: [name], MD   Chief Complaint: 6 month follow-up  History of Present Illness:    Tonya Hall is a 71 y.o. female with a hx of psoriatic arthritis, Schamberg disease, long-term steroid use, HTN, DM, neuropathy, multiple other medical problems who is being seen for follow-up.   She was seen by Dr. Debara Pickett in early 2023 for pedal and periorbital edema. It was suspected the lower extremity edema was related to neuropathy. It was suspected periorbital edema was related to chronic steroid use. Given RF for CAD a coronary calcium score was ordered. Coronary calcium score came back at 140, 75th percentile for age/sex matched.   Today, the patient is overall doing well. She was supposed to see the lipid clinic, but was re-scheduled with general cardiology. She denies chest pain, shortness of breath, orthopnea or pnd. She reports occasional lower leg edema. She has GERD. BP is mildly elevated, which seems to be the norm for her. She does not check BP at home. She thinks Bp is elevated because of the pain. She has a bad back and PA. She had stopped the drug because she was in the doughnut whole. She started Repatha back 6 weeks ago.   Past Medical History:  Diagnosis Date   Asthma    Albuterol in haler prn   Cataract    immature unsure which eye   Chronic back pain    COVID-19 October/November 2020   Degenerative disk disease    psoriatic   Diabetes mellitus without complication (HCC)    diet controlled   Esophageal motility disorder    Non-specific, see modified barium study/speech path, BP   Fibromyalgia    GERD (gastroesophageal reflux disease)    takes Protonix bid   Headache 2019   History of blood transfusion    post c-section   History of bronchitis    HTN  (hypertension)    Hyperlipidemia    takes Pravastatin daily   Lymphocytic colitis 05/26/2010   Responded to Entocort x 3 MOS   Neuropathy    Nonallergic rhinitis    Osteoporosis    gets Boniva every 3 months   Peripheral edema    takes HCTZ daily   Pneumonia 02/2019   Psoriatic arthritis ([place])    Dr. Katherina Right   Psoriatic arthritis [facility])    bilateral hands   Raynaud's disease    Seasonal allergies    Shingles 2020   SUI (stress urinary incontinence, female)    Urinary urgency     Past Surgical History:  Procedure Laterality Date   ANTERIOR CERVICAL DECOMP/DISCECTOMY FUSION N/A 03/09/2020   Procedure: ANTERIOR CERVICAL DECOMPRESSION/DISCECTOMY FUSION, INTERBODY PROSTHESIS, PLATE SCREWS CERVICAL FIVE-SIX, CERVICAL SIX-SEVEN;  Surgeon: Newman Pies, MD;  Location: [place];  Service: Neurosurgery;  Laterality: N/A;   BACK SURGERY  2003   BIOPSY  04/17/2016   Procedure: BIOPSY;  Surgeon: Danie Binder, MD;  [facility];  Service: Endoscopy;;  random colon bx's   CERVICAL SPINE SURGERY  09/09/2017   CESAREAN SECTION  1974, 1978       COLONOSCOPY  06/19/2008   [facility]:[id number] internal hemorrhoids/mild sigmoin colon diverticulosis   COLONOSCOPY  2012   Dr. Gala Romney: normal rectum, diverticula, lymphocytic colitis    COLONOSCOPY WITH  PROPOFOL N/A 04/17/2016   Dr. Oneida Alar: Normal terminal ileum, internal/external hemorrhoids, random colon biopsies consistent with collagenous colitis   DILATION AND CURETTAGE OF UTERUS  1977   spontaneous abortion   ESOPHAGOGASTRODUODENOSCOPY  06/19/2008   GNF:AOZHYQMV gastritis   LUMBAR DISC ARTHROPLASTY  09/1996   LUMBAR FUSION  6/03, 11/08, 11/14   L4-5 fusion, L2-3 fusion, L1-2   LUMBAR FUSION  08/03/2020   L4-L5   LUMBAR LAMINECTOMY  03/2020   MOUTH SURGERY  12/07/2021   oral surgery   ODONTOID SCREW INSERTION N/A 01/27/2018   Procedure: ODONTOID SCREW INSERTION;  Surgeon: Newman Pies, MD;  Location: [place];  Service:  Neurosurgery;  Laterality: N/A;  ODONTOID SCREW INSERTION   TONSILLECTOMY     TONSILLECTOMY AND ADENOIDECTOMY     TUBAL LIGATION      Current Medications: Current Meds  Medication Sig   albuterol (PROVENTIL HFA;VENTOLIN HFA) 108 (90 BASE) MCG/ACT inhaler Inhale 2 puffs into the lungs every 6 (six) hours as needed for wheezing or shortness of breath.    Apremilast (OTEZLA) 30 MG TABS Take 1 tablet (30 mg total) by mouth 2 (two) times daily.   aspirin 81 MG tablet Take 81 mg by mouth daily.   azelastine (ASTELIN) 0.1 % nasal spray Place 1 spray into both nostrils 2 (two) times daily.   azelastine (OPTIVAR) 0.05 % ophthalmic solution Apply 1 drop to eye 2 (two) times daily.   Calcium Carb-Cholecalciferol (CALCIUM 600 + D PO) Take 1 tablet by mouth 2 (two) times daily.    Cetirizine HCl 10 MG CAPS Take by oral route.   chlorhexidine (PERIDEX) 0.12 % solution SMARTSIG:0.5 Ounce(s) By Mouth Twice Daily   denosumab (PROLIA) 60 MG/ML SOSY injection Inject 60 mg into the skin every 6 (six) months.   Evolocumab (REPATHA SURECLICK) 784 MG/ML SOAJ Inject 1 Dose into the skin every 14 (fourteen) days.   fish oil-omega-3 fatty acids 1000 MG capsule Take 1 g by mouth daily.   gabapentin (NEURONTIN) 600 MG tablet Take 600 mg by mouth 3 (three) times daily.   Ginger, Zingiber officinalis, (GINGER ROOT) 500 MG CAPS Take 500 mg by mouth daily.   Glucosamine Sulfate 1000 MG CAPS Take 2,000 mg by mouth daily.   HUMULIN N KWIKPEN 100 UNIT/ML Kiwkpen Inject 10 Units into the skin at bedtime.   HYDROcodone-acetaminophen (NORCO/VICODIN) 5-325 MG tablet Take 1 tablet by mouth every 4 (four) hours as needed.   hydrocortisone sodium succinate (SOLU-CORTEF) 100 MG SOLR injection Inject 100 mg into the muscle daily as needed (when unable to keep down prednisone tablet).    Lancets (ONETOUCH DELICA PLUS [id number]) MISC USE TO TEST 3 TIMESUDAILY.   leflunomide (ARAVA) 20 MG tablet TAKE 1 TABLET BY MOUTH ONCE DAILY.    losartan (COZAAR) 100 MG tablet Take by mouth.   metoprolol succinate (TOPROL-XL) 50 MG 24 hr tablet Take 1 tablet (50 mg total) by mouth daily. Take with or immediately following a meal.   Misc Natural Products (TART CHERRY ADVANCED PO) Take 1 tablet by mouth daily. daily   Multiple Vitamin (MULTIVITAMIN) tablet Take 1 tablet by mouth daily.   nortriptyline (PAMELOR) 25 MG capsule TAKE (1) CAPSULE BY MOUTH AT BEDTIME. (Patient taking differently: Take 25 mg by mouth at bedtime.)   ONETOUCH VERIO test strip    oxybutynin (DITROPAN-XL) 5 MG 24 hr tablet TAKE 1 TABLET BY MOUTH AT BEDTIME.   OZEMPIC, 0.25 OR 0.5 MG/DOSE, 2 MG/3ML SOPN SMARTSIG:0.25 Milligram(s) SUB-Q Once a Week  pantoprazole (PROTONIX) 40 MG tablet Take 1 tablet (40 mg total) by mouth 2 (two) times daily before a meal. 30 minutes prior   Polyethyl Glycol-Propyl Glycol (SYSTANE OP) Apply to eye 4 (four) times daily.   potassium chloride SA (K-DUR,KLOR-CON) 20 MEQ tablet Take 20 mEq by mouth daily.    predniSONE (DELTASONE) 5 MG tablet TAKE 1 TABLET BY MOUTH DAILY WITH BREAKFAST   spironolactone (ALDACTONE) 25 MG tablet Take 25 mg by mouth daily.   SURE COMFORT PEN NEEDLES 31G X 5 MM MISC    TALTZ 80 MG/ML SOAJ INJECT '80MG'$  (1 PEN) UNDER THE SKIN EVERY 4 WEEKS   topiramate (TOPAMAX) 200 MG tablet TAKE (1) TABLET BY MOUTH ONCE AT BEDTIME.   traMADol (ULTRAM) 50 MG tablet Take 50 mg by mouth every 8 (eight) hours as needed.   Turmeric 500 MG CAPS Take 500 mg by mouth every morning.     Allergies:   Adalimumab, Imuran [azathioprine sodium], Methotrexate, Plaquenil [hydroxychloroquine sulfate], Sulfonamide derivatives, Azathioprine, Ceftin [cefuroxime axetil], Cefuroxime, Ezetimibe, Insulin detemir, Lisinopril, Metformin hcl, Morphine sulfate, Pravastatin sodium, Sulfamethoxazole-trimethoprim, and Morphine   Social History   Socioeconomic History   Marital status: Married    Spouse name: Not on file   Number of children: Not on  file   Years of education: Not on file   Highest education level: Not on file  Occupational History   Occupation: Case Dietitian    Employer: [employer]    Comment: Engineer, technical sales  Tobacco Use   Smoking status: Never    Passive exposure: Never   Smokeless tobacco: Never  Vaping Use   Vaping Use: Never used  Substance and Sexual Activity   Alcohol use: No   Drug use: No   Sexual activity: Not Currently    Partners: Male    Birth control/protection: Surgical    Comment: BTL  Other Topics Concern   Not on file  Social History Narrative   ATTENDS [employer]. 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   Social Determinants of Health   Financial Resource Strain: Not on file  Food Insecurity: Not on file  Transportation Needs: Not on file  Physical Activity: Not on file  Stress: Not on file  Social Connections: Not on file     Family History: The patient's family history includes Arthritis in her mother; Colon cancer in her maternal uncle; Congestive Heart Failure in an other family member; Diabetes in her mother, sister, sister, and another family member; Fibromyalgia in her mother, sister, and sister; Hypertension in her son and another family member; Pancreatitis in her mother; Parkinson's disease in an other family member; Rheum arthritis in her sister and sister; Rheumatic fever in her father; Skin cancer in an other family member. There is no history of Colon polyps.  ROS:   Please see the history of present illness.     All other systems reviewed and are negative.  EKGs/Labs/Other Studies Reviewed:    The following studies were reviewed today:  IMPRESSION: Coronary calcium score of 140. This was 75th percentile for age-, race-, and sex-matched controls.  EKG:  EKG is not ordered today.    Recent Labs: 03/19/2022: ALT 29; BUN 26; Creat 1.01; Hemoglobin 12.3; Platelets 253; Potassium 3.5; Sodium 140  Recent Lipid Panel    Component Value Date/Time   TRIG 289 (H)  02/06/2019 1712   Physical Exam:    VS:  BP (!) 146/62   Pulse 79   Ht '4\' 10"'$  (1.473 m)  Wt 125 lb 6.4 oz (56.9 kg)   LMP 02/01/1999   SpO2 100%   BMI 26.21 kg/m     Wt Readings from Last 3 Encounters:  03/28/22 125 lb 6.4 oz (56.9 kg)  03/19/22 125 lb 6.4 oz (56.9 kg)  03/15/22 126 lb 6.4 oz (57.3 kg)     GEN:  Well nourished, well developed in no acute distress HEENT: Normal NECK: No JVD; No carotid bruits LYMPHATICS: No lymphadenopathy CARDIAC: RRR, no murmurs, rubs, gallops RESPIRATORY:  Clear to auscultation without rales, wheezing or rhonchi  ABDOMEN: Soft, non-tender, non-distended MUSCULOSKELETAL:  No edema; No deformity  SKIN: Warm and dry NEUROLOGIC:  Alert and oriented x 3 PSYCHIATRIC:  Normal affect   ASSESSMENT:    1. Agatston coronary artery calcium score between 100 and 400   2. Essential hypertension   3. Dyslipidemia    PLAN:    In order of problems listed above:  Elevated CAC score of 140 The patient denies chest pain, however she is not very active at baseline. Continue Aspirin, Repatha, and Toprol. No further ischemic testing indicated at this time.   HTN BP is mildly elevated. She is on losartan '100mg'$  daily, Toprol '50mg'$  daily and spironolactone '25mg'$  daily. I recommended she check BP at home for 2 weeks and call in with numbers. She may require a new medication at follow-up.  Dyslipidemia Patient was previously started on Repatha for lipid management by Dr. Debara Pickett. She had stopped Repatha because she was in the doughnut whole. She restarted it 6 weeks ago and repeat lipids were ordered. Overall numbers look improved, however TG still high at 375. I will send the chart to Dr. Debara Pickett for any further recommendations.   Disposition: Follow up in 3 month(s) with MD/APP   Signed, Dailen Mcclish Ninfa Meeker, PA-C  03/28/2022 3:35 PM    [facility]

## 2022-03-28 NOTE — Telephone Encounter (Signed)
Return call to pt she is was wanting the lab results, Informed pt that Dr has not read the results as of yet.she will await call back with results,

## 2022-03-28 NOTE — Telephone Encounter (Signed)
Patient states she would like to speak with someone about Dr. Debara Pickett reviewing her recent lab work.

## 2022-03-29 ENCOUNTER — Telehealth: Payer: Self-pay

## 2022-03-29 MED ORDER — OMEGA-3 FATTY ACIDS 1000 MG PO CAPS: 2.0000 g | ORAL_CAPSULE | Freq: Every day | ORAL | Status: AC

## 2022-03-29 NOTE — Telephone Encounter (Signed)
Patient notified and verbalized understanding. Patient had no further questions at this time.  

## 2022-03-29 NOTE — Telephone Encounter (Signed)
Pt informed of providers result & recommendations. Pt verbalized understanding. All questions, if any, were answered.

## 2022-03-29 NOTE — Telephone Encounter (Signed)
Patient returning call.

## 2022-03-29 NOTE — Telephone Encounter (Signed)
Left a message for patient to call office back regarding medication management.

## 2022-03-29 NOTE — Telephone Encounter (Signed)
-----   Message from [place], PA-C sent at 03/29/2022 10:13 AM EST ----- Good morning, these are Dr. Lysbeth Penner instructions post visit. Can we please call and let the patient know to increase fish oil to 2-4 grams daily? And send this in if she needs it. Thanks!  ----- Message ----- From: Pixie Casino, MD Sent: 03/28/2022   9:11 PM EST To: Cad[place] Ninfa Meeker, PA-C  Have her increase fish oil - 2-4 grams daily (it is reported she is only on 1 g)  -Mali ----- Message ----- From: Antony Madura, PA-C Sent: 03/28/2022   4:02 PM EST To: Pixie Casino, MD  Follow-up after starting Repatha. She had to re-schedule with me. Can labs be reviewed and any further recommendations given. Thanks.

## 2022-03-29 NOTE — Telephone Encounter (Signed)
Left message to call back  

## 2022-03-30 NOTE — Telephone Encounter (Signed)
Called [employer] for update on patient's [employer] application. Application has been moved to "ready for proceeding" stage today. Rep unsure why application was flagged for missing information  Phone# [phone]  Knox Saliva, PharmD, MPH, BCPS, CPP Clinical Pharmacist (Rheumatology and Pulmonology)

## 2022-04-04 NOTE — Telephone Encounter (Signed)
Received a fax from  [place] regarding an approval for [place] patient assistance from 04/04/2022 to 04/02/2023. Approval letter sent to scan center.  Phone number: [phone]  Knox Saliva, PharmD, MPH, BCPS, CPP Clinical Pharmacist (Rheumatology and Pulmonology)

## 2022-04-05 ENCOUNTER — Other Ambulatory Visit: Payer: Self-pay | Admitting: Physician Assistant

## 2022-04-05 NOTE — Telephone Encounter (Signed)
Next Visit: 06/20/2022  Last Visit: 03/19/2022  Last Fill: 01/08/2022  DX: Psoriatic arthritis   Current Dose per office note on 03/19/2022: prednisone 5 mg 1 tablet daily.   Okay to refill prednisone?

## 2022-04-06 ENCOUNTER — Encounter: Payer: Self-pay | Admitting: Physical Medicine and Rehabilitation

## 2022-04-20 DIAGNOSIS — M546 Pain in thoracic spine: Secondary | ICD-10-CM | POA: Diagnosis not present

## 2022-04-24 ENCOUNTER — Other Ambulatory Visit: Payer: Self-pay | Admitting: Neurosurgery

## 2022-04-24 ENCOUNTER — Other Ambulatory Visit: Payer: Self-pay

## 2022-04-24 DIAGNOSIS — M546 Pain in thoracic spine: Secondary | ICD-10-CM

## 2022-04-25 ENCOUNTER — Ambulatory Visit
Admission: RE | Admit: 2022-04-25 | Discharge: 2022-04-25 | Disposition: A | Discharge status: Home / Self Care | Payer: PPO | Source: Ambulatory Visit | Attending: Neurosurgery | Admitting: Neurosurgery

## 2022-04-25 DIAGNOSIS — M5124 Other intervertebral disc displacement, thoracic region: Secondary | ICD-10-CM | POA: Diagnosis not present

## 2022-04-25 DIAGNOSIS — M546 Pain in thoracic spine: Secondary | ICD-10-CM

## 2022-04-25 MED ORDER — TRIAMCINOLONE ACETONIDE 40 MG/ML IJ SUSP (RADIOLOGY)
60.0000 mg | Freq: Once | INTRAMUSCULAR | Status: AC
  Administered 2022-04-25: 60 mg via EPIDURAL

## 2022-04-25 MED ORDER — IOPAMIDOL (ISOVUE-M 200) INJECTION 41%
1.0000 mL | Freq: Once | INTRAMUSCULAR | Status: AC
  Administered 2022-04-25: 1 mL via EPIDURAL

## 2022-04-25 NOTE — Discharge Instructions (Signed)

## 2022-04-26 ENCOUNTER — Telehealth: Payer: Self-pay | Admitting: Pharmacy Technician

## 2022-04-26 NOTE — Telephone Encounter (Addendum)
Auth Submission: NO AUTH NEEDED Patient will be seen '@AP'$  Payer: [employer] ADVT Medication & CPT/J Code(s) submitted: Prolia (Denosumab) [id number] Route of submission (phone, fax, portal):  Phone [phone] Fax [phone] Auth type: Buy/Bill Units/visits requested: X2 Reference number: [id number] Approval from: 04/26/22 to 04/02/23   [employer] advt will cover 80% and patient will be responsible for remaining 20%.

## 2022-04-27 ENCOUNTER — Encounter (HOSPITAL_COMMUNITY): Payer: Self-pay | Admitting: Endocrinology

## 2022-05-03 DIAGNOSIS — I10 Essential (primary) hypertension: Secondary | ICD-10-CM | POA: Diagnosis not present

## 2022-05-14 ENCOUNTER — Encounter (HOSPITAL_COMMUNITY)
Admission: RE | Admit: 2022-05-14 | Discharge: 2022-05-14 | Disposition: A | Discharge status: Home / Self Care | Payer: PPO | Source: Ambulatory Visit | Attending: Endocrinology | Admitting: Endocrinology

## 2022-05-14 VITALS — BP 150/67 | HR 91 | Temp 97.5°F | Resp 16

## 2022-05-14 DIAGNOSIS — G894 Chronic pain syndrome: Secondary | ICD-10-CM | POA: Insufficient documentation

## 2022-05-14 DIAGNOSIS — M25552 Pain in left hip: Secondary | ICD-10-CM | POA: Diagnosis not present

## 2022-05-14 DIAGNOSIS — M810 Age-related osteoporosis without current pathological fracture: Secondary | ICD-10-CM | POA: Diagnosis not present

## 2022-05-14 DIAGNOSIS — M5136 Other intervertebral disc degeneration, lumbar region: Secondary | ICD-10-CM | POA: Insufficient documentation

## 2022-05-14 MED ORDER — DENOSUMAB 60 MG/ML ~~LOC~~ SOSY
60.0000 mg | PREFILLED_SYRINGE | Freq: Once | SUBCUTANEOUS | Status: AC
  Administered 2022-05-14: 60 mg via SUBCUTANEOUS

## 2022-05-14 NOTE — Progress Notes (Signed)
Diagnosis: Osteoporosis  Provider:   Jacelyn Pi, MD  Procedure: Injection  Prolia (Denosumab), Dose: 60 mg, Site: subcutaneous, Number of injections: 1  Post Care: Observation period completed  Discharge: Condition: Good, Destination: Home . AVS Provided  Performed by:  Oren Beckmann, RN

## 2022-05-14 NOTE — Addendum Note (Signed)
Encounter addended by: Baxter Hire, RN on: 05/14/2022 11:26 AM  Actions taken: Therapy plan modified

## 2022-05-24 ENCOUNTER — Other Ambulatory Visit: Payer: Self-pay

## 2022-05-24 ENCOUNTER — Other Ambulatory Visit (HOSPITAL_BASED_OUTPATIENT_CLINIC_OR_DEPARTMENT_OTHER): Payer: Self-pay | Admitting: Obstetrics & Gynecology

## 2022-05-24 DIAGNOSIS — L409 Psoriasis, unspecified: Secondary | ICD-10-CM

## 2022-05-24 NOTE — Telephone Encounter (Signed)
Patient needs refill of Otezla sent to [facility].

## 2022-05-25 MED ORDER — OTEZLA 30 MG PO TABS: 30.0000 mg | ORAL_TABLET | Freq: Two times a day (BID) | ORAL | 0 refills | Status: DC

## 2022-05-25 NOTE — Telephone Encounter (Signed)
Next Visit: 06/20/2022  Last Visit: 03/19/2022  Last Fill: 02/12/2022  DX: Psoriatic arthritis    Current Dose per office note 03/19/2022: [name] 30 mg 1 tablet twice daily   Labs: 03/19/2022 Creatinine is borderline elevated-1.01 and GFR is borderline low-59. >60 is WNL. Calcium is elevated-11.0.     Okay to refill [name]?

## 2022-05-25 NOTE — Addendum Note (Signed)
Addended by: Carole Binning on: 05/25/2022 09:41 AM   Modules accepted: Orders

## 2022-05-28 NOTE — Progress Notes (Signed)
Office Visit Note  Patient: Tonya Hall             Date of Birth: 02/12/1951           MRN: [id number]             PCP: Carylon Perches, MD Referring: Carylon Perches, MD Visit Date: 05/29/2022 Occupation: @GUAROCC @  Subjective:  Left hip pain   History of Present Illness: Tonya Hall is a 72 y.o. female with history of psoriatic arthritis and osteoarthritis.  She remains on Taltz 80 mg sq injections q 4 weeks, Arava 20mg  1 tablet daily, Otezla 30 mg 1 tablet by mouth twice daily, and prednisone 5 mg 1 tablet daily.  Patient reports that she had a recent gap in therapy while taking Arava due to an upper respiratory tract infection.  She has resumed Arava as prescribed.  Patient presents today with increased pain in the left SI joint.  She is also been having ongoing discomfort in the left trochanteric bursa.  She states that she has bursitis pain on a daily basis.  In the past she had minimal to no improvement after trying physical therapy and stretching exercises.  She states that about 4 to 6 weeks ago she had a thoracic spine injection which provided some relief.  She continues to follow-up with Dr. Berline Chough for pain management.  She underwent Visco gel injections in both knees in October 2023 which provided temporary relief.  She would likely like to reapply for Visco gel injections for both knees once she is eligible.  She denies any psoriasis at this time.     Activities of Daily Living:  Patient reports joint stiffness all day  Patient Reports nocturnal pain.  Difficulty dressing/grooming: Reports Difficulty climbing stairs: Reports Difficulty getting out of chair: Reports Difficulty using hands for taps, buttons, cutlery, and/or writing: Reports  Review of Systems  Constitutional:  Positive for fatigue.  HENT:  Positive for mouth dryness. Negative for mouth sores.   Eyes:  Positive for dryness.  Respiratory:  Negative for shortness of breath.   Cardiovascular:  Negative for  chest pain and palpitations.  Gastrointestinal:  Negative for blood in stool, constipation and diarrhea.  Endocrine: Negative for increased urination.  Genitourinary:  Negative for involuntary urination.  Musculoskeletal:  Positive for joint pain, joint pain, joint swelling, myalgias, muscle weakness, morning stiffness, muscle tenderness and myalgias. Negative for gait problem.  Skin:  Positive for sensitivity to sunlight. Negative for color change, rash and hair loss.  Allergic/Immunologic: Positive for susceptible to infections.  Neurological:  Negative for dizziness and headaches.  Hematological:  Negative for swollen glands.  Psychiatric/Behavioral:  Negative for depressed mood and sleep disturbance. The patient is not nervous/anxious.     PMFS History:  Patient Active Problem List   Diagnosis Date Noted   Statin not tolerated 10/23/2021   Chronic allergic conjunctivitis 07/29/2021   Vasomotor rhinitis 07/29/2021   Benign neoplasm of skin of lower limb 05/17/2021   History of neoplasm 05/17/2021   Melanocytic nevi of left upper limb, including shoulder 05/17/2021   Melanocytic nevi of trunk 05/17/2021   Other seborrheic keratosis 05/17/2021   Diarrhea 03/10/2021   Fracture of pelvis (HCC) 01/27/2021   Pain of left hip joint 01/06/2021   Foraminal stenosis of lumbar region 08/03/2020   Cervical spondylosis with myelopathy and radiculopathy 03/09/2020   AKI (acute kidney injury) (HCC) 02/07/2019   Acute respiratory failure with hypoxia (HCC) 02/06/2019   Acute respiratory disease  due to COVID-19 virus 02/06/2019   Community acquired pneumonia 02/06/2019   Hyponatremia 02/06/2019   Asthma    Diabetes mellitus without complication (HCC)    Closed nondisplaced odontoid fracture with type II morphology (HCC) 01/27/2018   Odontoid fracture (HCC) 01/24/2018   Chest pain 11/14/2017   Transaminitis 06/22/2016   Collagenous colitis 05/23/2016   Psoriasis 05/21/2016   High risk  medication use 02/15/2016   Age-related osteoporosis without current pathological fracture 02/15/2016   Trochanteric bursitis, left hip 02/15/2016   Sacroiliitis, not elsewhere classified (HCC) 02/15/2016   Chronic pain syndrome 02/15/2016   Abnormal weight gain 06/25/2012   Hypertension 07/03/2011   Hypercholesteremia 07/03/2011   Psoriatic arthritis (HCC) 07/03/2011   Osteoarthritis of multiple joints 07/03/2011   Esophageal motility disorder 02/14/2011   Cough 02/14/2011   GERD 05/05/2010    Past Medical History:  Diagnosis Date   Asthma    Albuterol in haler prn   Cataract    immature unsure which eye   Chronic back pain    COVID-19 October/November 2020   Degenerative disk disease    psoriatic   Diabetes mellitus without complication (HCC)    diet controlled   Esophageal motility disorder    Non-specific, see modified barium study/speech path, BP   Fibromyalgia    GERD (gastroesophageal reflux disease)    takes Protonix bid   Headache 2019   History of blood transfusion    post c-section   History of bronchitis    HTN (hypertension)    Hyperlipidemia    takes Pravastatin daily   Lymphocytic colitis 05/26/2010   Responded to Entocort x 3 MOS   Neuropathy    Nonallergic rhinitis    Osteoporosis    gets Boniva every 3 months   Peripheral edema    takes HCTZ daily   Pneumonia 02/2019   Psoriatic arthritis (HCC)    Dr. Anthonette Legato   Psoriatic arthritis (HCC)    bilateral hands   Raynaud's disease    Seasonal allergies    Shingles 2020   SUI (stress urinary incontinence, female)    Urinary urgency     Family History  Problem Relation Age of Onset   Diabetes Mother    Pancreatitis Mother    Arthritis Mother        psoriatic    Fibromyalgia Mother    Rheumatic fever Father    Diabetes Other        grandparents   Skin cancer Other        grandfather   Hypertension Other        grandparent   Congestive Heart Failure Other        grandfather    Parkinson's disease Other        grandmother   Colon cancer Maternal Uncle    Fibromyalgia Sister    Rheum arthritis Sister    Diabetes Sister    Fibromyalgia Sister    Diabetes Sister    Rheum arthritis Sister    Hypertension Son    Colon polyps Neg Hx    Past Surgical History:  Procedure Laterality Date   ANTERIOR CERVICAL DECOMP/DISCECTOMY FUSION N/A 03/09/2020   Procedure: ANTERIOR CERVICAL DECOMPRESSION/DISCECTOMY FUSION, INTERBODY PROSTHESIS, PLATE SCREWS CERVICAL FIVE-SIX, CERVICAL SIX-SEVEN;  Surgeon: Tressie Stalker, MD;  Location: [facility] OR;  Service: Neurosurgery;  Laterality: N/A;   BACK SURGERY  2003   BIOPSY  04/17/2016   Procedure: BIOPSY;  Surgeon: West Bali, MD;  Location: [facility];  Service: Endoscopy;;  random colon bx's   CERVICAL SPINE SURGERY  09/09/2017   CESAREAN SECTION  1974, 1978       COLONOSCOPY  06/19/2008   UEA:[id number] internal hemorrhoids/mild sigmoin colon diverticulosis   COLONOSCOPY  2012   Dr. Jena Gauss: normal rectum, diverticula, lymphocytic colitis    COLONOSCOPY WITH PROPOFOL N/A 04/17/2016   Dr. Darrick Penna: Normal terminal ileum, internal/external hemorrhoids, random colon biopsies consistent with collagenous colitis   DILATION AND CURETTAGE OF UTERUS  1977   spontaneous abortion   ESOPHAGOGASTRODUODENOSCOPY  06/19/2008   JXB:[id number] gastritis   LUMBAR DISC ARTHROPLASTY  09/1996   LUMBAR FUSION  6/03, 11/08, 11/14   L4-5 fusion, L2-3 fusion, L1-2   LUMBAR FUSION  08/03/2020   L4-L5   LUMBAR LAMINECTOMY  03/2020   MOUTH SURGERY  12/07/2021   oral surgery   ODONTOID SCREW INSERTION N/A 01/27/2018   Procedure: ODONTOID SCREW INSERTION;  Surgeon: Tressie Stalker, MD;  Location: [facility] OR;  Service: Neurosurgery;  Laterality: N/A;  ODONTOID SCREW INSERTION   TONSILLECTOMY     TONSILLECTOMY AND ADENOIDECTOMY     TUBAL LIGATION     Social History   Social History Narrative   ATTENDS [employer]. 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    Immunization History  Administered Date(s) Administered   Influenza,inj,Quad PF,6+ Mos 01/21/2017   Influenza-Unspecified 01/30/2017   Moderna Sars-Covid-2 Vaccination 06/01/2019, 07/02/2019   Pneumococcal Polysaccharide-23 02/19/2013   Tdap 09/10/2016     Objective: Vital Signs: BP (!) 127/58 (BP Location: Left Arm, Patient Position: Sitting, Cuff Size: Normal)   Pulse 83   Resp 17   Ht 4\' 11"  (1.499 m)   Wt 123 lb (55.8 kg)   LMP 02/01/1999   BMI 24.84 kg/m    Physical Exam Vitals and nursing note reviewed.  Constitutional:      Appearance: She is well-developed.  HENT:     Head: Normocephalic and atraumatic.  Eyes:     Conjunctiva/sclera: Conjunctivae normal.  Cardiovascular:     Rate and Rhythm: Normal rate and regular rhythm.     Heart sounds: Normal heart sounds.  Pulmonary:     Effort: Pulmonary effort is normal.     Breath sounds: Normal breath sounds.  Abdominal:     General: Bowel sounds are normal.     Palpations: Abdomen is soft.  Musculoskeletal:     Cervical back: Normal range of motion.  Skin:    General: Skin is warm and dry.     Capillary Refill: Capillary refill takes less than 2 seconds.  Neurological:     Mental Status: She is alert and oriented to person, place, and time.  Psychiatric:        Behavior: Behavior normal.      Musculoskeletal Exam: C-spine has limited ROM with lateral rotation.  Painful ROM of thoracic spine.  Thoracic kyphosis noted. Tenderness of the left SI joint.  Shoulder joints have stiffness and discomfort bilaterally.  Elbow joints have good ROM.  Limited ROM of both wrist joints.  Severe PIP and DIP thickening.  Incomplete fist formation bilaterally.  Right hip has good ROM with no groin pain.  Slightly limited ROM of the left hip.  Tenderness over both trochanteric bursa, left > right.  Knee joints have good ROM with no warmth or effusion.  Ankle joints have good ROM with no tenderness or synovitis.  No evidence of  achilles tendonitis or plantar fasciitis.    CDAI Exam: CDAI Score: -- Patient Global: --; Provider Global: -- Swollen: --;  Tender: -- Joint Exam 05/29/2022   No joint exam has been documented for this visit   There is currently no information documented on the homunculus. Go to the Rheumatology activity and complete the homunculus joint exam.  Investigation: No additional findings.  Imaging: No results found.  Recent Labs: Lab Results  Component Value Date   WBC 13.0 (H) 03/19/2022   HGB 12.3 03/19/2022   PLT 253 03/19/2022   NA 140 03/19/2022   K 3.5 03/19/2022   CL 106 03/19/2022   CO2 23 03/19/2022   GLUCOSE 96 03/19/2022   BUN 26 (H) 03/19/2022   CREATININE 1.01 (H) 03/19/2022   BILITOT 0.2 03/19/2022   ALKPHOS 70 12/21/2020   AST 19 03/19/2022   ALT 29 03/19/2022   PROT 6.4 03/19/2022   ALBUMIN 4.4 12/21/2020   CALCIUM 11.0 (H) 03/19/2022   GFRAA 65 06/08/2020   QFTBGOLD Negative 05/05/2015   QFTBGOLDPLUS NEGATIVE 09/20/2021    Speciality Comments: MTX- high LFTs, PLQ,SSZ,Imuran, Humira- allergy, Enbrel , Remicade - inadequate response  Procedures:  Sacroiliac Joint Inj on 05/29/2022 10:07 AM Indications: pain Details: 27 G 1.5 in needle, posterior approach Medications: 1 mL lidocaine 1 %; 40 mg triamcinolone acetonide 40 MG/ML Aspirate: 0 mL Outcome: tolerated well, no immediate complications Procedure, treatment alternatives, risks and benefits explained, specific risks discussed. Consent was given by the patient. Immediately prior to procedure a time out was called to verify the correct patient, procedure, equipment, support staff and site/side marked as required. Patient was prepped and draped in the usual sterile fashion.     Allergies: Adalimumab, Imuran [azathioprine sodium], Methotrexate, Plaquenil [hydroxychloroquine sulfate], Sulfonamide derivatives, Azathioprine, Ceftin [cefuroxime axetil], Cefuroxime, Ezetimibe, Insulin detemir, Lisinopril,  Metformin hcl, Morphine sulfate, Pravastatin sodium, Sulfamethoxazole-trimethoprim, and Morphine    Assessment / Plan:     Visit Diagnoses: Psoriatic arthritis (HCC): No synovitis or dactylitis noted today.  Patient presents today with left SI joint pain which has progressively been worsening over the past several weeks.  She requested a left SI joint cortisone injection today.  She tolerated procedure well.  Procedure note was completed above.  Overall her psoriatic arthritis remains stable on Taltz 80 mg sq injections every 4 weeks, Arava 20 mg 1 tablet daily, Otezla 30 mg 1 tablet twice daily, and prednisone 5 mg daily.  No active psoriasis at this time.  No evidence of Achilles tendinitis or plantar fasciitis.  No medication changes will be made at this time.  She was advised to notify us if she develops signs or symptoms of a flare.  She plans on keeping her follow-up visit scheduled on 06/20/2022.  Psoriasis: No active psoriasis at this time.   High risk medication use - Taltz 80 mg sq injections q 4 weeks, Arava 20mg  1 tablet daily, Otezla 30 mg 1 tablet by mouth twice daily, and prednisone 5 mg 1 tablet daily.  CBC and CMP updated on 03/19/22.  Her next lab work will be due in March and every 3 months.  Standing orders for CBC and CMP were placed today. TB gold negative on 09/20/21.   Discussed the importance of holding taltz and arava if she develops signs or symptoms of an infection and to resume once the infection has completely cleared.  - Plan: CBC with Differential/Platelet, COMPLETE METABOLIC PANEL WITH GFR  Chronic left SI joint pain -The left SI joint was injected with cortisone today.  Plan: Sacroiliac Joint Inj  Trochanteric bursitis of both hips: Chronic pain.  She has  tenderness to palpation over bilateral trochanteric bursa, left greater than right.  If her symptoms persist or worsen she would like to have a left trochanteric bursa cortisone injection at her scheduled follow-up  visit on 06/20/2022.  Primary osteoarthritis of both knees: Good ROM of both knee joints with no warmth or effusion.    DDD (degenerative disc disease), cervical: Limited ROM with lateral rotation.   DDD (degenerative disc disease), thoracic - Followed by Dr. Lovell Sheehan.  Thoracic spine degenerative changes similar to 2021 noted on MRI from 09/19/2021.  Injection 4-6 weeks ago was helpful.   DDD (degenerative disc disease), lumbar: Chronic pain.  Followed by Dr. Lovell Sheehan and Dr. Berline Chough.   Raynaud's syndrome without gangrene: Not currently active.    Age-related osteoporosis without current pathological fracture - Followed by Dr. Talmage Nap.  She remains on Prolia 60 mg sq injections every 6 months.  She is taking a calcium and vitamin D supplement daily.  No recent fall or fractures.   Chronic pain syndrome: Followed by Dr. Berline Chough.   Other medical conditions are listed as follows:   Essential hypertension: BP was 127/58 today in the office.   Esophageal motility disorder  History of type 2 diabetes mellitus  Adrenal insufficiency (HCC)  History of gastroesophageal reflux (GERD)  Collagenous colitis  Senile purpura (HCC)  Orders: Orders Placed This Encounter  Procedures   Sacroiliac Joint Inj   CBC with Differential/Platelet   COMPLETE METABOLIC PANEL WITH GFR   No orders of the defined types were placed in this encounter.    Follow-Up Instructions: Return for Psoriatic arthritis.   Gearldine Bienenstock, PA-C  Note - This record has been created using Dragon software.  Chart creation errors have been sought, but may not always  have been located. Such creation errors do not reflect on  the standard of medical care.

## 2022-05-29 ENCOUNTER — Ambulatory Visit: Payer: PPO | Admitting: Podiatry

## 2022-05-29 ENCOUNTER — Encounter: Payer: PPO | Admitting: Physician Assistant

## 2022-05-29 ENCOUNTER — Encounter: Payer: Self-pay | Admitting: Physician Assistant

## 2022-05-29 VITALS — BP 127/58 | HR 83 | Resp 17 | Ht 59.0 in | Wt 123.0 lb

## 2022-05-29 DIAGNOSIS — G8929 Other chronic pain: Secondary | ICD-10-CM | POA: Diagnosis not present

## 2022-05-29 DIAGNOSIS — M170 Bilateral primary osteoarthritis of knee: Secondary | ICD-10-CM

## 2022-05-29 DIAGNOSIS — G894 Chronic pain syndrome: Secondary | ICD-10-CM

## 2022-05-29 DIAGNOSIS — Z79899 Other long term (current) drug therapy: Secondary | ICD-10-CM

## 2022-05-29 DIAGNOSIS — M5136 Other intervertebral disc degeneration, lumbar region: Secondary | ICD-10-CM

## 2022-05-29 DIAGNOSIS — K224 Dyskinesia of esophagus: Secondary | ICD-10-CM

## 2022-05-29 DIAGNOSIS — I7300 Raynaud's syndrome without gangrene: Secondary | ICD-10-CM

## 2022-05-29 DIAGNOSIS — M810 Age-related osteoporosis without current pathological fracture: Secondary | ICD-10-CM | POA: Diagnosis not present

## 2022-05-29 DIAGNOSIS — L409 Psoriasis, unspecified: Secondary | ICD-10-CM

## 2022-05-29 DIAGNOSIS — L4050 Arthropathic psoriasis, unspecified: Secondary | ICD-10-CM | POA: Diagnosis not present

## 2022-05-29 DIAGNOSIS — M51369 Other intervertebral disc degeneration, lumbar region without mention of lumbar back pain or lower extremity pain: Secondary | ICD-10-CM

## 2022-05-29 DIAGNOSIS — I10 Essential (primary) hypertension: Secondary | ICD-10-CM | POA: Diagnosis not present

## 2022-05-29 DIAGNOSIS — M5030 Other cervical disc degeneration, unspecified cervical region: Secondary | ICD-10-CM

## 2022-05-29 DIAGNOSIS — Z8719 Personal history of other diseases of the digestive system: Secondary | ICD-10-CM

## 2022-05-29 DIAGNOSIS — M7061 Trochanteric bursitis, right hip: Secondary | ICD-10-CM

## 2022-05-29 DIAGNOSIS — E2740 Unspecified adrenocortical insufficiency: Secondary | ICD-10-CM

## 2022-05-29 DIAGNOSIS — K52831 Collagenous colitis: Secondary | ICD-10-CM

## 2022-05-29 DIAGNOSIS — M5134 Other intervertebral disc degeneration, thoracic region: Secondary | ICD-10-CM | POA: Diagnosis not present

## 2022-05-29 DIAGNOSIS — M533 Sacrococcygeal disorders, not elsewhere classified: Secondary | ICD-10-CM

## 2022-05-29 DIAGNOSIS — D692 Other nonthrombocytopenic purpura: Secondary | ICD-10-CM

## 2022-05-29 DIAGNOSIS — E119 Type 2 diabetes mellitus without complications: Secondary | ICD-10-CM | POA: Diagnosis not present

## 2022-05-29 DIAGNOSIS — M7062 Trochanteric bursitis, left hip: Secondary | ICD-10-CM

## 2022-05-29 DIAGNOSIS — Z8639 Personal history of other endocrine, nutritional and metabolic disease: Secondary | ICD-10-CM

## 2022-05-29 MED ORDER — TRIAMCINOLONE ACETONIDE 40 MG/ML IJ SUSP
40.0000 mg | INTRAMUSCULAR | Status: AC | PRN
  Administered 2022-05-29: 40 mg via INTRA_ARTICULAR

## 2022-05-29 MED ORDER — LIDOCAINE HCL 1 % IJ SOLN
1.0000 mL | INTRAMUSCULAR | Status: AC | PRN
  Administered 2022-05-29: 1 mL

## 2022-05-30 ENCOUNTER — Other Ambulatory Visit: Payer: Self-pay

## 2022-06-06 NOTE — Progress Notes (Unsigned)
Office Visit Note  Patient: Tonya Hall             Date of Birth: [birth year]           MRN: [id number]             PCP: [name], MD Referring: [name], MD Visit Date: 06/20/2022 Occupation: @GUAROCC @  Subjective:  Thoracic pain   History of Present Illness: DANISSA RUNDLE is a 72 y.o. female with history of psoriatic arthritis.  Patient remains on Taltz 80 mg sq injections q 4 weeks, Arava 20mg  1 tablet daily, Otezla 30 mg 1 tablet by mouth twice daily, and prednisone 5 mg 1 tablet daily.  She is tolerating combination therapy without any side effects.  She denies any signs or symptoms of a psoriatic arthritis flare recently.  She noticed improvement after having a left SI joint cortisone injection on 05/29/2022.  She continues to have ongoing discomfort in trochanteric bursitis bilaterally, left greater than right but overall her symptoms have been tolerable.  She does not want a repeat injection at this time.  She continues to have discomfort in the thoracic spine.  She had an epidural injection which provided 2 to 3 weeks of relief.  She plans on following back up for repeat injection.  She established care with Dr. Dagoberto Ligas for pain management.  She was given a prescription for hydrocodone which she takes if she has moderate to severe pain.  She was also restarted on Lyrica and the dose of gabapentin was also adjusted. She remains on Prolia 60 mg subcutaneous injections every 6 months prescribed by Dr. Chalmers Cater.   She denies any recurrent infections.      Activities of Daily Living:  Patient reports morning stiffness for 24 hours.   Patient Reports nocturnal pain.  Difficulty dressing/grooming: Reports Difficulty climbing stairs: Reports Difficulty getting out of chair: Reports Difficulty using hands for taps, buttons, cutlery, and/or writing: Reports  Review of Systems  Constitutional:  Positive for fatigue.  HENT:  Positive for mouth dryness. Negative for mouth sores.    Eyes:  Positive for dryness.  Respiratory:  Negative for shortness of breath.   Cardiovascular:  Negative for chest pain and palpitations.  Gastrointestinal:  Negative for blood in stool, constipation and diarrhea.  Endocrine: Negative for increased urination.  Genitourinary:  Negative for involuntary urination.  Musculoskeletal:  Positive for joint pain, gait problem, joint pain, joint swelling, myalgias, muscle weakness, morning stiffness, muscle tenderness and myalgias.  Skin:  Positive for color change. Negative for rash, hair loss and sensitivity to sunlight.  Allergic/Immunologic: Positive for susceptible to infections.  Neurological:  Negative for dizziness and headaches.  Hematological:  Negative for swollen glands.  Psychiatric/Behavioral:  Negative for depressed mood and sleep disturbance. The patient is not nervous/anxious.     PMFS History:  Patient Active Problem List   Diagnosis Date Noted   Statin not tolerated 10/23/2021   Chronic allergic conjunctivitis 07/29/2021   Vasomotor rhinitis 07/29/2021   Benign neoplasm of skin of lower limb 05/17/2021   History of neoplasm 05/17/2021   Melanocytic nevi of left upper limb, including shoulder 05/17/2021   Melanocytic nevi of trunk 05/17/2021   Other seborrheic keratosis 05/17/2021   Diarrhea 03/10/2021   Fracture of pelvis ([place]) 01/27/2021   Pain of left hip joint 01/06/2021   Foraminal stenosis of lumbar region 08/03/2020   Cervical spondylosis with myelopathy and radiculopathy 03/09/2020   AKI (acute kidney injury) ([place]) 02/07/2019  Acute respiratory failure with hypoxia (HCC) 02/06/2019   Acute respiratory disease due to COVID-19 virus 02/06/2019   Community acquired pneumonia 02/06/2019   Hyponatremia 02/06/2019   Asthma    Diabetes mellitus without complication ([place])    Closed nondisplaced odontoid fracture with type II morphology ([place]) 01/27/2018   Odontoid fracture ([place]) 01/24/2018   Chest pain 11/14/2017    Transaminitis 06/22/2016   Collagenous colitis 05/23/2016   Psoriasis 05/21/2016   High risk medication use 02/15/2016   Age-related osteoporosis without current pathological fracture 02/15/2016   Trochanteric bursitis, left hip 02/15/2016   Sacroiliitis, not elsewhere classified ([place]) 02/15/2016   Chronic pain syndrome 02/15/2016   Abnormal weight gain 06/25/2012   Hypertension 07/03/2011   Hypercholesteremia 07/03/2011   Psoriatic arthritis ([place]) 07/03/2011   Osteoarthritis of multiple joints 07/03/2011   Esophageal motility disorder 02/14/2011   Cough 02/14/2011   GERD 05/05/2010    Past Medical History:  Diagnosis Date   Asthma    Albuterol in haler prn   Cataract    immature unsure which eye   Chronic back pain    COVID-19 October/November 2020   Degenerative disk disease    psoriatic   Diabetes mellitus without complication (HCC)    diet controlled   Esophageal motility disorder    Non-specific, see modified barium study/speech path, BP   Fibromyalgia    GERD (gastroesophageal reflux disease)    takes Protonix bid   Headache 2019   History of blood transfusion    post c-section   History of bronchitis    HTN (hypertension)    Hyperlipidemia    takes Pravastatin daily   Lymphocytic colitis 05/26/2010   Responded to Entocort x 3 MOS   Neuropathy    Nonallergic rhinitis    Osteoporosis    gets Boniva every 3 months   Peripheral edema    takes HCTZ daily   Pneumonia 02/2019   Psoriatic arthritis ([place])    Dr. Katherina Right   Psoriatic arthritis ([place])    bilateral hands   Raynaud's disease    Seasonal allergies    Shingles 2020   SUI (stress urinary incontinence, female)    Urinary urgency     Family History  Problem Relation Age of Onset   Diabetes Mother    Pancreatitis Mother    Arthritis Mother        psoriatic    Fibromyalgia Mother    Rheumatic fever Father    Diabetes Other        grandparents   Skin cancer Other        grandfather    Hypertension Other        grandparent   Congestive Heart Failure Other        grandfather   Parkinson's disease Other        grandmother   Colon cancer Maternal Uncle    Fibromyalgia Sister    Rheum arthritis Sister    Diabetes Sister    Fibromyalgia Sister    Diabetes Sister    Rheum arthritis Sister    Hypertension Son    Colon polyps Neg Hx    Past Surgical History:  Procedure Laterality Date   ANTERIOR CERVICAL DECOMP/DISCECTOMY FUSION N/A 03/09/2020   Procedure: ANTERIOR CERVICAL DECOMPRESSION/DISCECTOMY FUSION, INTERBODY PROSTHESIS, PLATE SCREWS CERVICAL FIVE-SIX, CERVICAL SIX-SEVEN;  Surgeon: Newman Pies, MD;  Location: [facility];  Service: Neurosurgery;  Laterality: N/A;   BACK SURGERY  2003   BIOPSY  04/17/2016   Procedure: BIOPSY;  Surgeon:  Danie Binder, MD;  [facility];  Service: Endoscopy;;  random colon bx's   CERVICAL SPINE SURGERY  09/09/2017   CESAREAN SECTION  1974, 1978       COLONOSCOPY  06/19/2008   WUJ:WJXBJ internal hemorrhoids/mild sigmoin colon diverticulosis   COLONOSCOPY  2012   Dr. Gala Romney: normal rectum, diverticula, lymphocytic colitis    COLONOSCOPY WITH PROPOFOL N/A 04/17/2016   Dr. Oneida Alar: Normal terminal ileum, internal/external hemorrhoids, random colon biopsies consistent with collagenous colitis   DILATION AND CURETTAGE OF UTERUS  1977   spontaneous abortion   ESOPHAGOGASTRODUODENOSCOPY  06/19/2008   YNW:GNFAOZHY gastritis   LUMBAR DISC ARTHROPLASTY  09/1996   LUMBAR FUSION  6/03, 11/08, 11/14   L4-5 fusion, L2-3 fusion, L1-2   LUMBAR FUSION  08/03/2020   L4-L5   LUMBAR LAMINECTOMY  03/2020   MOUTH SURGERY  12/07/2021   oral surgery   ODONTOID SCREW INSERTION N/A 01/27/2018   Procedure: ODONTOID SCREW INSERTION;  Surgeon: Newman Pies, MD;  Location: [facility];  Service: Neurosurgery;  Laterality: N/A;  ODONTOID SCREW INSERTION   TONSILLECTOMY     TONSILLECTOMY AND ADENOIDECTOMY     TUBAL LIGATION     Social History    Social History Narrative   ATTENDS [employer]. 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   Immunization History  Administered Date(s) Administered   Influenza,inj,Quad PF,6+ Mos 01/21/2017   Influenza-Unspecified 01/30/2017   Moderna Sars-Covid-2 Vaccination 06/01/2019, 07/02/2019   Pneumococcal Polysaccharide-23 02/19/2013   Tdap 09/10/2016     Objective: Vital Signs: BP 132/72 (BP Location: Left Arm, Patient Position: Sitting, Cuff Size: Normal)   Pulse 76   Resp 14   Ht 4\' 11"  (1.499 m)   Wt 122 lb (55.3 kg)   LMP 02/01/1999   BMI 24.64 kg/m    Physical Exam Vitals and nursing note reviewed.  Constitutional:      Appearance: She is well-developed.  HENT:     Head: Normocephalic and atraumatic.  Eyes:     Conjunctiva/sclera: Conjunctivae normal.  Cardiovascular:     Rate and Rhythm: Normal rate and regular rhythm.     Heart sounds: Normal heart sounds.  Pulmonary:     Effort: Pulmonary effort is normal.     Breath sounds: Normal breath sounds.  Abdominal:     General: Bowel sounds are normal.     Palpations: Abdomen is soft.  Musculoskeletal:     Cervical back: Normal range of motion.  Skin:    General: Skin is warm and dry.     Capillary Refill: Capillary refill takes less than 2 seconds.  Neurological:     Mental Status: She is alert and oriented to person, place, and time.  Psychiatric:        Behavior: Behavior normal.      Musculoskeletal Exam: C-spine has limited range of motion with lateral rotation.  Painful range of motion of the thoracic spine.  Midline spinal tenderness in the thoracic region.  Thoracic kyphosis noted.  Discomfort with range of motion of the lumbar spine.  Shoulder joints have good range of motion with some discomfort in the right shoulder.  Tenderness over the right AC joint.  Elbow joints have good range of motion with no discomfort or tenderness.  Limited range of motion of both wrist joints.  Severe PIP and DIP thickening.   Incomplete fist formation bilaterally.  Limited range of motion of the left hip.  Tenderness over the left trochanteric bursa.  Knee joints have good range  of motion with no warmth or effusion.  Ankle joints have good range of motion with no tenderness or synovitis.  No tenderness or synovitis over MTP joints.  No evidence of Achilles tendinitis or plantar fasciitis.  CDAI Exam: CDAI Score: -- Patient Global: --; Provider Global: -- Swollen: --; Tender: -- Joint Exam 06/20/2022   No joint exam has been documented for this visit   There is currently no information documented on the homunculus. Go to the Rheumatology activity and complete the homunculus joint exam.  Investigation: No additional findings.  Imaging: No results found.  Recent Labs: Lab Results  Component Value Date   WBC 13.0 (H) 03/19/2022   HGB 12.3 03/19/2022   PLT 253 03/19/2022   NA 140 03/19/2022   K 3.5 03/19/2022   CL 106 03/19/2022   CO2 23 03/19/2022   GLUCOSE 96 03/19/2022   BUN 26 (H) 03/19/2022   CREATININE 1.01 (H) 03/19/2022   BILITOT 0.2 03/19/2022   ALKPHOS 70 12/21/2020   AST 19 03/19/2022   ALT 29 03/19/2022   PROT 6.4 03/19/2022   ALBUMIN 4.4 12/21/2020   CALCIUM 11.0 (H) 03/19/2022   GFRAA 65 06/08/2020   QFTBGOLD Negative 05/05/2015   QFTBGOLDPLUS NEGATIVE 09/20/2021    Speciality Comments: MTX- high LFTs, PLQ,SSZ,Imuran, Humira- allergy, Enbrel , Remicade - inadequate response  Procedures:  No procedures performed Allergies: Adalimumab, Imuran [azathioprine sodium], Methotrexate, Plaquenil [hydroxychloroquine sulfate], Sulfonamide derivatives, Azathioprine, Ceftin [cefuroxime axetil], Cefuroxime, Ezetimibe, Insulin detemir, Lisinopril, Metformin hcl, Morphine sulfate, Pravastatin sodium, Sulfamethoxazole-trimethoprim, and Morphine   Assessment / Plan:     Visit Diagnoses: Psoriatic arthritis ([place]): No synovitis or dactylitis was noted on examination today.  Her left SI joint pain  has improved since having a cortisone injection on 05/29/2022.  She has no evidence of Achilles tendinitis or plantar fasciitis.  Overall her psoriatic arthritis remains stable on Taltz 80 mg subcu injections every 4 weeks, Arava 20 mg 1 tablet daily, and Otezla 30 mg 1 tablet twice daily.  She remains on prednisone 5 mg daily.  No medication changes will be made at this time.  She was established care with pain management, Dr. Dagoberto Ligas, and was given a prescription for hydrocodone which she can take as needed for moderate to severe pain relief.  She was also restarted on Lyrica and the dose of gabapentin was adjusted.  She was very satisfied with her appointment at pain management and will be following up in 3 months. No medication changes will be made at this time.  A refill of Donnetta Hail will be sent to the pharmacy.  She was advised to notify us if she develops signs or symptoms of a flare.  She will follow-up in the office in 3 months or sooner if needed.  Psoriasis: She has 1 small patch of possible psoriasis on her scalp.  No other psoriasis patches were noted.  She will remain on the current treatment regimen.  She was advised to notify us if she develops any new or worsening patches of psoriasis.  High risk medication use - Taltz 80 mg sq injections q 4 weeks, Arava 20mg  1 tablet daily, Otezla 30 mg 1 tablet by mouth twice daily, and prednisone 5 mg 1 tablet daily.  CBC and CMP updated on 03/19/22. Orders for CBC and CMP released today.  Her next lab work will be due in June and every 3 months to monitor for drug toxicity. TB gold negative on 09/20/21.  Future order for TB Gold placed  today. No recurrent infections.  Discussed the importance of holding taltz and arava if she develops signs or symptoms of an infection and to resume once the infection has completely cleared.  - Plan: CBC with Differential/Platelet, COMPLETE METABOLIC PANEL WITH GFR, QuantiFERON-TB Gold Plus  Screening for tuberculosis -  Future order for TB gold placed today.  Plan: QuantiFERON-TB Gold Plus  Chronic left SI joint pain: She had a left SI joint cortisone injection on 05/29/2022 which provided significant relief.  Primary osteoarthritis of both knees: She has good range of motion of both knee joints on examination today.  No warmth or effusion noted.  She underwent Visco gel injections for both knees in October 2023 which provided significant relief.  She has some discomfort after sitting for prolonged periods of time but has no active inflammation.  DDD (degenerative disc disease), cervical: C-spine has limited ROM.  No symptoms of radiculopathy currently.    DDD (degenerative disc disease), thoracic - Followed by Dr. Arnoldo Morale.  Thoracic spine degenerative changes similar to 2021 noted on MRI from 09/19/2021.  She recently had an epidural injection which provided temporary relief.  She plans on following back up with Dr. Arnoldo Morale to see if she can have a repeat epidural injection.  DDD (degenerative disc disease), lumbar - Followed by Dr. Arnoldo Morale and Dr. Dagoberto Ligas.  Trochanteric bursitis of both hips - Improved.  Raynaud's syndrome without gangrene - More active seasonally.  No digital ulcers or signs of gangrene.   Age-related osteoporosis without current pathological fracture - Followed by Dr. Chalmers Cater.  She remains on Prolia 60 mg sq injections every 6 months.    Chronic pain syndrome: She has been referred to Dr. Dagoberto Ligas for pain management.  She has been restarted on Lyrica and the dose of gabapentin has been adjusted.  She was given a prescription for hydrocodone which she takes for moderate to severe pain relief as needed.  Other medical conditions are listed as follows:  Essential hypertension: BP was 132/72 today in the office.   Esophageal motility disorder  History of type 2 diabetes mellitus  Adrenal insufficiency (HCC)  History of gastroesophageal reflux (GERD)  Collagenous colitis  Senile purpura  ([place])  Pedal edema   Orders: Orders Placed This Encounter  Procedures   CBC with Differential/Platelet   COMPLETE METABOLIC PANEL WITH GFR   QuantiFERON-TB Gold Plus   No orders of the defined types were placed in this encounter.   Follow-Up Instructions: Return in about 3 months (around 09/20/2022) for Psoriatic arthritis.   Ofilia Neas, PA-C  Note - This record has been created using Dragon software.  Chart creation errors have been sought, but may not always  have been located. Such creation errors do not reflect on  the standard of medical care.

## 2022-06-09 ENCOUNTER — Other Ambulatory Visit: Payer: Self-pay | Admitting: Rheumatology

## 2022-06-11 NOTE — Telephone Encounter (Signed)
Next Visit: 06/20/2022  Last Visit: 05/29/2022  Last Fill: 03/12/2022  Current Dose per office note on 05/29/2022: not mentioned  Okay to refill Topamax?

## 2022-06-15 ENCOUNTER — Encounter: Payer: Self-pay | Admitting: Physical Medicine and Rehabilitation

## 2022-06-15 ENCOUNTER — Encounter: Payer: PPO | Attending: Physical Medicine and Rehabilitation | Admitting: Physical Medicine and Rehabilitation

## 2022-06-15 VITALS — BP 168/78 | HR 77 | Ht 59.0 in | Wt 123.0 lb

## 2022-06-15 DIAGNOSIS — L4050 Arthropathic psoriasis, unspecified: Secondary | ICD-10-CM | POA: Insufficient documentation

## 2022-06-15 DIAGNOSIS — M159 Polyosteoarthritis, unspecified: Secondary | ICD-10-CM | POA: Insufficient documentation

## 2022-06-15 DIAGNOSIS — Z5181 Encounter for therapeutic drug level monitoring: Secondary | ICD-10-CM | POA: Diagnosis not present

## 2022-06-15 DIAGNOSIS — Z79891 Long term (current) use of opiate analgesic: Secondary | ICD-10-CM | POA: Diagnosis not present

## 2022-06-15 DIAGNOSIS — G894 Chronic pain syndrome: Secondary | ICD-10-CM | POA: Diagnosis not present

## 2022-06-15 MED ORDER — HYDROCODONE-ACETAMINOPHEN 5-325 MG PO TABS: 1.0000 | ORAL_TABLET | Freq: Three times a day (TID) | ORAL | 0 refills | Status: DC | PRN

## 2022-06-15 MED ORDER — PREGABALIN 50 MG PO CAPS: 50.0000 mg | ORAL_CAPSULE | Freq: Three times a day (TID) | ORAL | 5 refills | Status: DC

## 2022-06-15 NOTE — Patient Instructions (Signed)
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  - feels like dose is working, so don't need to change actual dose.   2. Retry Lyrica  will do 50 mg QHS x 2 days, then 650 mg in Am and 100 mg QHS; and increase by 1 tab q3-7 days; to a max of 100 mg TID or 100 mg in Am and 200 mg QHS- for nerve pain and to increase pain threshold.   3. Can cause swelling in legs/hands- call me and will reduce dose if working- takes 3-4 weeks for Lyrica to really kick in .   4. For really bad days, will prescribe Norco for those really bad days. Will send in Norco 5/325 mg 3x/day as needed- has taken some CBD oil in last week- rarely takes, but educated cannot take in future- since wasn't on contract at that time, is Okfor this one time.     5. Will need UDS and opiate contract- -cannot do NSAIDs and not allowed to take much tylenol, so doesn't have other choices. .   6.  Call Rheumatology or Dr Arnoldo Morale for injections.    7. F/U in 3 months- call me in 1 month to let me know how things going or mychart- call earlier if side effects.

## 2022-06-15 NOTE — Addendum Note (Signed)
Addended by: Jasmine December T on: 06/15/2022 10:39 AM   Modules accepted: Orders

## 2022-06-15 NOTE — Progress Notes (Signed)
Subjective:    Patient ID: Tonya Hall, female    DOB: 02/01/1951, 72 y.o.   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   Family Hx; of HLD and no CAD Also strong hx of psoriatic and RA  Fingertips numb- so takes Gabapentin for tingling- is helpful. Dose works- 2-3 tabs/day-   Sees Dr Arnoldo Morale for back issues.   On  Taltz and Prednisone 5mg  daily; [place]- for Psoriatic arthritis  Have done more good than other meds- Hands don't swell half as much as used to.    Has pain all over- main Sx is aching-  And SI joint and thoracic spine and trochanteric bursitis on L side- constant pain-   Skin doesn't hurt when pushed on skin- and not painful on muscles- doesn't have Fibromyalgia- per pt.  When has disc issue, has burning, tingling/bugs crawling on L leg only then- and tingling in hands, but usually those aren't major sources of pain.    Stays home a lot, but goes /moves whenever she can.   Tried: Norco in 9/23 for tooth being pulled- didn't use- doesn't do a lot of good.  -used to take 1-3x/day- helped a little- knocked the edge off.  - Tramadol- no difference at all.  - Gabapentin- 300 mg 2-3x/day Has had ESI's - thoracic helped a little, but not long- a few weeks.  ~4-  6 weeks ago- at [facility] Has tried Cymbalta- might have given her diarrhea- - had to take off it. Collagenous colitis- Duloxetine can cause very rarely.  Lyrica- it quit working- took it 20 years ago- worked 1-2 years.   Pain Inventory Average Pain 5 Pain Right Now 4 My pain is constant, sharp, burning, dull, stabbing, and aching  In the last 24 hours,  has pain interfered with the following? General activity 7 Relation with others 5 Enjoyment of life 5 What TIME of day is your pain at its worst? daytime Sleep (in general) Good  Pain is worse with: walking and standing Pain improves with: heat/ice and injections Relief from Meds:  varies  walk with assistance use a cane how many minutes can you walk? 10-20 ability to climb steps?  yes do you drive?  yes  retired I need assistance with the following:  meal prep, household duties, and shopping  No problems in this area      Family History  Problem Relation Age of Onset   Diabetes Mother    Pancreatitis Mother    Arthritis Mother        psoriatic    Fibromyalgia Mother    Rheumatic fever Father    Diabetes Other        grandparents   Skin cancer Other        grandfather   Hypertension Other        grandparent   Congestive Heart Failure Other        grandfather   Parkinson's disease Other        grandmother   Colon cancer Maternal Uncle    Fibromyalgia Sister    Rheum arthritis Sister  Diabetes Sister    Fibromyalgia Sister    Diabetes Sister    Rheum arthritis Sister    Hypertension Son    Colon polyps Neg Hx    Social History   Socioeconomic History   Marital status: Married    Spouse name: Not on file   Number of children: Not on file   Years of education: Not on file   Highest education level: Not on file  Occupational History   Occupation: Case Mgr RN    Employer: [employer]    Comment: Engineer, technical sales  Tobacco Use   Smoking status: Never    Passive exposure: Never   Smokeless tobacco: Never  Vaping Use   Vaping Use: Never used  Substance and Sexual Activity   Alcohol use: No   Drug use: No   Sexual activity: Not Currently    Partners: Male    Birth control/protection: Surgical    Comment: BTL  Other Topics Concern   Not on file  Social History Narrative   ATTENDS [employer]. 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   Social  Determinants of Health   Financial Resource Strain: Not on file  Food Insecurity: Not on file  Transportation Needs: Not on file  Physical Activity: Not on file  Stress: Not on file  Social Connections: Not on file   Past Surgical History:  Procedure Laterality Date   ANTERIOR CERVICAL DECOMP/DISCECTOMY FUSION N/A 03/09/2020   Procedure: ANTERIOR CERVICAL DECOMPRESSION/DISCECTOMY FUSION, INTERBODY PROSTHESIS, PLATE SCREWS CERVICAL FIVE-SIX, CERVICAL SIX-SEVEN;  Surgeon: Newman Pies, MD;  Location: [place];  Service: Neurosurgery;  Laterality: N/A;   BACK SURGERY  2003   BIOPSY  04/17/2016   Procedure: BIOPSY;  Surgeon: Danie Binder, MD;  [facility];  Service: Endoscopy;;  random colon bx's   CERVICAL SPINE SURGERY  09/09/2017   CESAREAN SECTION  1974, 1978       COLONOSCOPY  06/19/2008   [id number] internal hemorrhoids/mild sigmoin colon diverticulosis   COLONOSCOPY  2012   Dr. Gala Romney: normal rectum, diverticula, lymphocytic colitis    COLONOSCOPY WITH PROPOFOL N/A 04/17/2016   Dr. Oneida Alar: Normal terminal ileum, internal/external hemorrhoids, random colon biopsies consistent with collagenous colitis   DILATION AND CURETTAGE OF UTERUS  1977   spontaneous abortion   ESOPHAGOGASTRODUODENOSCOPY  06/19/2008   [id number] gastritis   LUMBAR DISC ARTHROPLASTY  09/1996   LUMBAR FUSION  6/03, 11/08, 11/14   L4-5 fusion, L2-3 fusion, L1-2   LUMBAR FUSION  08/03/2020   L4-L5   LUMBAR LAMINECTOMY  03/2020   MOUTH SURGERY  12/07/2021   oral surgery   ODONTOID SCREW INSERTION N/A 01/27/2018   Procedure: ODONTOID SCREW INSERTION;  Surgeon: Newman Pies, MD;  Location: [place];  Service: Neurosurgery;  Laterality: N/A;  ODONTOID SCREW INSERTION   TONSILLECTOMY     TONSILLECTOMY AND ADENOIDECTOMY     TUBAL LIGATION     Past Medical History:  Diagnosis Date   Asthma    Albuterol in haler prn   Cataract    immature unsure which eye   Chronic back pain    COVID-19  October/November 2020   Degenerative disk disease    psoriatic   Diabetes mellitus without complication ([place])    diet controlled   Esophageal motility disorder    Non-specific, see modified barium study/speech path, BP   Fibromyalgia    GERD (gastroesophageal reflux disease)    takes Protonix bid   Headache 2019   History of blood transfusion  post c-section   History of bronchitis    HTN (hypertension)    Hyperlipidemia    takes Pravastatin daily   Lymphocytic colitis 05/26/2010   Responded to Entocort x 3 MOS   Neuropathy    Nonallergic rhinitis    Osteoporosis    gets Boniva every 3 months   Peripheral edema    takes HCTZ daily   Pneumonia 02/2019   Psoriatic arthritis ([place])    Dr. Katherina Right   Psoriatic arthritis [facility])    bilateral hands   Raynaud's disease    Seasonal allergies    Shingles 2020   SUI (stress urinary incontinence, female)    Urinary urgency    BP (!) 168/78   Pulse 77   Ht 4\' 11"  (1.499 m)   Wt 123 lb (55.8 kg)   LMP 02/01/1999   SpO2 100%   BMI 24.84 kg/m   Opioid Risk Score:   Fall Risk Score:  `1  Depression screen [facility] 2/9     06/15/2022    9:42 AM 12/12/2020   10:27 AM 11/19/2017    3:26 PM  Depression screen PHQ 2/9  Decreased Interest 0 0 0  Down, Depressed, Hopeless 0 0 0  PHQ - 2 Score 0 0 0  Altered sleeping 0    Tired, decreased energy 2    Change in appetite 0    Feeling bad or failure about yourself  0    Trouble concentrating 0    Moving slowly or fidgety/restless 1    Suicidal thoughts 0    PHQ-9 Score 3    Difficult doing work/chores Very difficult       Review of Systems  Constitutional:  Positive for unexpected weight change.  All other systems reviewed and are negative.     Objective:   Physical Exam Awake, alert, appropriate, uses quad cane to walk, NAD 2/15 Tender points-  Tight muscles/trigger points only in upper traps and scalenes.  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  - feels like dose is working, so don't need to change actual dose.   2. Retry Lyrica  will do 50 mg QHS x 2 days, then 650 mg in Am and 100 mg QHS; and increase by 1 tab q3-7 days; to a max of 100 mg TID or 100 mg in Am and 200 mg QHS- for nerve pain and to increase pain threshold.   3. Can cause swelling in legs/hands- call me and will reduce dose if working- takes 3-4 weeks for Lyrica to really kick in .   4. For really  bad days, will prescribe Norco for those really bad days. Will send in Norco 5/325 mg 3x/day as needed- has taken some CBD oil in last week- rarely takes, but educated cannot take in future- since wasn't on contract at that time, is Okfor this one time.     5. Will need UDS and opiate contract- -cannot do NSAIDs and not allowed to take much tylenol, so doesn't have other choices. .   6.  Call Rheumatology or Dr Arnoldo Morale for injections.    7. F/U in 3 months- call me in 1 month to let me know how things going or mychart- call earlier if side effects.    8 Doesn't have myofasical pain or Fibromyalgia, FYI  I spent a total of  46   minutes on total care today- >50% coordination of care- due to education on opiate contract, opiates, Lyrica and side  effects and discussion of her multiple medical issues

## 2022-06-18 ENCOUNTER — Encounter: Payer: Self-pay | Admitting: Pharmacist

## 2022-06-18 NOTE — Progress Notes (Signed)
[name]) [place] Team Statin Quality Measure Assessment  06/18/2022  Tonya Hall 1951/03/20 [id number]  Per review of chart and payor information, patient has a diagnosis of diabetes but is not currently filling a statin prescription.  This places patient into the Statin Use In Patients with Diabetes (SUPD) measure for CMS.    Patient has documented trials of atorvastatin and rosuvastatin  with reported muscle and joint aches, but no corresponding CPT codes that would exclude patient from SUPD measure.     Component Value Date/Time   TRIG 289 (H) 02/06/2019 1712    Please consider ONE of the following recommendations:  Initiate high intensity statin Atorvastatin 40 mg once daily, #90, 3 refills   Rosuvastatin 20 mg once daily, #90, 3 refills    Initiate moderate intensity          statin with reduced frequency if prior          statin intolerance 1x weekly, #13, 3 refills   2x weekly, #26, 3 refills   3x weekly, #39, 3 refills    Code for past statin intolerance or  other exclusions (required annually)  Provider Requirements: Associate code during an office visit or telehealth encounter  Drug Induced Myopathy G72.0   Myopathy, unspecified G72.9   Myositis, unspecified M60.9   Rhabdomyolysis M62.82   Cirrhosis of liver K74.69   Prediabetes R73.03   PCOS E28.2   Thank you for allowing [facility] pharmacy to be a part of this patient's care.   Loretha Brasil, PharmD [facility] Clinical Pharmacist Direct Dial: [phone]

## 2022-06-19 ENCOUNTER — Other Ambulatory Visit: Payer: Self-pay | Admitting: Physician Assistant

## 2022-06-19 DIAGNOSIS — L4050 Arthropathic psoriasis, unspecified: Secondary | ICD-10-CM

## 2022-06-19 DIAGNOSIS — L409 Psoriasis, unspecified: Secondary | ICD-10-CM

## 2022-06-19 DIAGNOSIS — Z79899 Other long term (current) drug therapy: Secondary | ICD-10-CM

## 2022-06-19 NOTE — Telephone Encounter (Signed)
Next Visit: 06/20/2022  Last Visit: 05/29/2022  Last Fill: 08/14/2021  [id number] arthritis   Current Dose per office note 05/29/2022: Taltz 80 mg sq injections q 4 weeks   Labs: 03/19/2022 Creatinine is borderline elevated-1.01 and GFR is borderline low-59. >60 is WNL Calcium is elevated-11.0.   TB Gold: 09/20/2021 Neg    Okay to refill Taltz?

## 2022-06-20 ENCOUNTER — Encounter: Payer: PPO | Attending: Endocrinology | Admitting: Physician Assistant

## 2022-06-20 ENCOUNTER — Encounter: Payer: Self-pay | Admitting: Physician Assistant

## 2022-06-20 ENCOUNTER — Ambulatory Visit: Payer: PPO | Admitting: Medical

## 2022-06-20 VITALS — BP 132/72 | HR 76 | Resp 14 | Ht 59.0 in | Wt 122.0 lb

## 2022-06-20 DIAGNOSIS — R600 Localized edema: Secondary | ICD-10-CM

## 2022-06-20 DIAGNOSIS — I7300 Raynaud's syndrome without gangrene: Secondary | ICD-10-CM | POA: Diagnosis not present

## 2022-06-20 DIAGNOSIS — L409 Psoriasis, unspecified: Secondary | ICD-10-CM | POA: Diagnosis not present

## 2022-06-20 DIAGNOSIS — Z79899 Other long term (current) drug therapy: Secondary | ICD-10-CM | POA: Diagnosis not present

## 2022-06-20 DIAGNOSIS — M5030 Other cervical disc degeneration, unspecified cervical region: Secondary | ICD-10-CM

## 2022-06-20 DIAGNOSIS — M533 Sacrococcygeal disorders, not elsewhere classified: Secondary | ICD-10-CM

## 2022-06-20 DIAGNOSIS — I10 Essential (primary) hypertension: Secondary | ICD-10-CM

## 2022-06-20 DIAGNOSIS — K224 Dyskinesia of esophagus: Secondary | ICD-10-CM

## 2022-06-20 DIAGNOSIS — L4050 Arthropathic psoriasis, unspecified: Secondary | ICD-10-CM

## 2022-06-20 DIAGNOSIS — M810 Age-related osteoporosis without current pathological fracture: Secondary | ICD-10-CM

## 2022-06-20 DIAGNOSIS — G894 Chronic pain syndrome: Secondary | ICD-10-CM

## 2022-06-20 DIAGNOSIS — G8929 Other chronic pain: Secondary | ICD-10-CM

## 2022-06-20 DIAGNOSIS — M7061 Trochanteric bursitis, right hip: Secondary | ICD-10-CM

## 2022-06-20 DIAGNOSIS — M7062 Trochanteric bursitis, left hip: Secondary | ICD-10-CM

## 2022-06-20 DIAGNOSIS — M51369 Other intervertebral disc degeneration, lumbar region without mention of lumbar back pain or lower extremity pain: Secondary | ICD-10-CM

## 2022-06-20 DIAGNOSIS — D692 Other nonthrombocytopenic purpura: Secondary | ICD-10-CM

## 2022-06-20 DIAGNOSIS — M5136 Other intervertebral disc degeneration, lumbar region: Secondary | ICD-10-CM

## 2022-06-20 DIAGNOSIS — Z8639 Personal history of other endocrine, nutritional and metabolic disease: Secondary | ICD-10-CM

## 2022-06-20 DIAGNOSIS — M5134 Other intervertebral disc degeneration, thoracic region: Secondary | ICD-10-CM | POA: Diagnosis not present

## 2022-06-20 DIAGNOSIS — Z8719 Personal history of other diseases of the digestive system: Secondary | ICD-10-CM

## 2022-06-20 DIAGNOSIS — K52831 Collagenous colitis: Secondary | ICD-10-CM

## 2022-06-20 DIAGNOSIS — M170 Bilateral primary osteoarthritis of knee: Secondary | ICD-10-CM

## 2022-06-20 DIAGNOSIS — E2740 Unspecified adrenocortical insufficiency: Secondary | ICD-10-CM

## 2022-06-20 DIAGNOSIS — Z111 Encounter for screening for respiratory tuberculosis: Secondary | ICD-10-CM

## 2022-06-20 LAB — TOXASSURE SELECT,+ANTIDEPR,UR

## 2022-06-20 NOTE — Patient Instructions (Signed)
Standing Labs We placed an order today for your standing lab work.   Please have your standing labs drawn in 3 months   Please have your labs drawn 2 weeks prior to your appointment so that the provider can discuss your lab results at your appointment, if possible.  Please note that you may see your imaging and lab results in [place] before we have reviewed them. We will contact you once all results are reviewed. Please allow our office up to 72 hours to thoroughly review all of the results before contacting the office for clarification of your results.  WALK-IN LAB HOURS  Monday through Thursday from 8:00 am -12:30 pm and 1:00 pm-5:00 pm and Friday from 8:00 am-12:00 pm.  Patients with office visits requiring labs will be seen before walk-in labs.  You may encounter longer than normal wait times. Please allow additional time. Wait times may be shorter on  Monday and Thursday afternoons.  We do not book appointments for walk-in labs. We appreciate your patience and understanding with our staff.   Labs are drawn by [facility]. Please bring your co-pay at the time of your lab draw.  You may receive a bill from [facility] for your lab work.  Please note if you are on Hydroxychloroquine and and an order has been placed for a Hydroxychloroquine level,  you will need to have it drawn 4 hours or more after your last dose.  If you wish to have your labs drawn at another location, please call the office 24 hours in advance so we can fax the orders.  The office is located at [address] [place], [place], [place] [address]   If you have any questions regarding directions or hours of operation,  please call [phone].   As a reminder, please drink plenty of water prior to coming for your lab work. Thanks!

## 2022-06-21 ENCOUNTER — Other Ambulatory Visit: Payer: Self-pay | Admitting: Physician Assistant

## 2022-06-21 DIAGNOSIS — Z79899 Other long term (current) drug therapy: Secondary | ICD-10-CM

## 2022-06-21 DIAGNOSIS — L409 Psoriasis, unspecified: Secondary | ICD-10-CM

## 2022-06-21 DIAGNOSIS — L4050 Arthropathic psoriasis, unspecified: Secondary | ICD-10-CM

## 2022-06-21 LAB — CBC WITH DIFFERENTIAL/PLATELET
Absolute Monocytes: 1077 cells/uL — ABNORMAL HIGH (ref 200–950)
Basophils Absolute: 146 cells/uL (ref 0–200)
Basophils Relative: 1.1 %
Eosinophils Absolute: 253 cells/uL (ref 15–500)
Eosinophils Relative: 1.9 %
HCT: 41.2 % (ref 35.0–45.0)
Hemoglobin: 13.6 g/dL (ref 11.7–15.5)
Lymphs Abs: 2846 cells/uL (ref 850–3900)
MCH: 31.3 pg (ref 27.0–33.0)
MCHC: 33 g/dL (ref 32.0–36.0)
MCV: 94.9 fL (ref 80.0–100.0)
MPV: 11.1 fL (ref 7.5–12.5)
Monocytes Relative: 8.1 %
Neutro Abs: 8978 cells/uL — ABNORMAL HIGH (ref 1500–7800)
Neutrophils Relative %: 67.5 %
Platelets: 298 10*3/uL (ref 140–400)
RBC: 4.34 10*6/uL (ref 3.80–5.10)
RDW: 12.9 % (ref 11.0–15.0)
Total Lymphocyte: 21.4 %
WBC: 13.3 10*3/uL — ABNORMAL HIGH (ref 3.8–10.8)

## 2022-06-21 LAB — COMPLETE METABOLIC PANEL WITH GFR
AG Ratio: 1.5 (calc) (ref 1.0–2.5)
ALT: 24 U/L (ref 6–29)
AST: 20 U/L (ref 10–35)
Albumin: 4.3 g/dL (ref 3.6–5.1)
Alkaline phosphatase (APISO): 44 U/L (ref 37–153)
BUN/Creatinine Ratio: 33 (calc) — ABNORMAL HIGH (ref 6–22)
BUN: 32 mg/dL — ABNORMAL HIGH (ref 7–25)
CO2: 21 mmol/L (ref 20–32)
Calcium: 10.5 mg/dL — ABNORMAL HIGH (ref 8.6–10.4)
Chloride: 104 mmol/L (ref 98–110)
Creat: 0.97 mg/dL (ref 0.60–1.00)
Globulin: 2.9 g/dL (calc) (ref 1.9–3.7)
Glucose, Bld: 95 mg/dL (ref 65–99)
Potassium: 4.1 mmol/L (ref 3.5–5.3)
Sodium: 136 mmol/L (ref 135–146)
Total Bilirubin: 0.3 mg/dL (ref 0.2–1.2)
Total Protein: 7.2 g/dL (ref 6.1–8.1)
eGFR: 62 mL/min/{1.73_m2} (ref >=60)

## 2022-06-21 NOTE — Progress Notes (Signed)
CBC stable. On long term prednisone.  Calcium remains borderline elevated but has improved.  Rest of CMP stable.

## 2022-06-21 NOTE — Progress Notes (Signed)
Creatinine and GFR have improved but if she would like a referral to nephrology we can place a referral for further evaluation.

## 2022-06-21 NOTE — Telephone Encounter (Signed)
Opened in error

## 2022-06-23 ENCOUNTER — Other Ambulatory Visit: Payer: Self-pay | Admitting: Physician Assistant

## 2022-06-23 DIAGNOSIS — L409 Psoriasis, unspecified: Secondary | ICD-10-CM

## 2022-06-23 DIAGNOSIS — L4050 Arthropathic psoriasis, unspecified: Secondary | ICD-10-CM

## 2022-06-23 DIAGNOSIS — Z79899 Other long term (current) drug therapy: Secondary | ICD-10-CM

## 2022-07-04 ENCOUNTER — Encounter: Payer: Self-pay | Admitting: Neurosurgery

## 2022-07-06 ENCOUNTER — Telehealth: Payer: Self-pay | Admitting: *Deleted

## 2022-07-06 NOTE — Telephone Encounter (Signed)
Urine drug screen for this encounter is consistent for prescribed medication 

## 2022-07-07 ENCOUNTER — Other Ambulatory Visit: Payer: Self-pay | Admitting: Neurosurgery

## 2022-07-07 DIAGNOSIS — M546 Pain in thoracic spine: Secondary | ICD-10-CM

## 2022-07-10 DIAGNOSIS — L578 Other skin changes due to chronic exposure to nonionizing radiation: Secondary | ICD-10-CM | POA: Diagnosis not present

## 2022-07-10 DIAGNOSIS — H61001 Unspecified perichondritis of right external ear: Secondary | ICD-10-CM | POA: Diagnosis not present

## 2022-07-11 ENCOUNTER — Other Ambulatory Visit: Payer: Self-pay | Admitting: *Deleted

## 2022-07-11 ENCOUNTER — Other Ambulatory Visit: Payer: Self-pay | Admitting: Physician Assistant

## 2022-07-11 MED ORDER — PREGABALIN 50 MG PO CAPS: 50.0000 mg | ORAL_CAPSULE | Freq: Three times a day (TID) | ORAL | 5 refills | Status: DC

## 2022-07-11 NOTE — Telephone Encounter (Signed)
Original rx for Pregablin was written with a taper. Pharmacy needs it to read 1 tab TID. Directions changed and resubmitted to pharmacy.

## 2022-07-11 NOTE — Addendum Note (Signed)
Addended by: Janean Sark on: 07/11/2022 03:51 PM   Modules accepted: Orders

## 2022-07-12 ENCOUNTER — Telehealth: Payer: Self-pay

## 2022-07-12 DIAGNOSIS — M25532 Pain in left wrist: Secondary | ICD-10-CM | POA: Diagnosis not present

## 2022-07-12 MED ORDER — GABAPENTIN 600 MG PO TABS: 600.0000 mg | ORAL_TABLET | Freq: Three times a day (TID) | ORAL | 5 refills | Status: DC

## 2022-07-12 MED ORDER — PREGABALIN 100 MG PO CAPS: 100.0000 mg | ORAL_CAPSULE | Freq: Three times a day (TID) | ORAL | 5 refills | Status: DC

## 2022-07-12 NOTE — Telephone Encounter (Signed)
Last Fill: 03/12/2022  Labs: 06/20/2022 CBC stable. On long term prednisone. Calcium remains borderline elevated but has improved.  Rest of CMP stable.  Next Visit: 09/20/2022  Last Visit: 06/20/2022  DX: Psoriatic arthritis   Current Dose per office note 06/20/2022: Arava 20mg  1 tablet daily   Okay to refill Arava ?

## 2022-07-12 NOTE — Telephone Encounter (Signed)
Spoke w/ pharmacy and informed them of Dr. Dahlia Client note about the 2 medications, they have received the prescriptions and have it ready for patient called member and left VM to advise.

## 2022-07-12 NOTE — Telephone Encounter (Signed)
Patient stating that pharmacy advised her she cannot take the lyrica and gabapentin at the same time because it is prescribed from 2 different doctors, patient said she is out of gabapentin and wants to know if you can just fill her gabapentin.

## 2022-07-16 ENCOUNTER — Ambulatory Visit
Admission: RE | Admit: 2022-07-16 | Discharge: 2022-07-16 | Disposition: A | Discharge status: Home / Self Care | Payer: PPO | Source: Ambulatory Visit | Attending: Neurosurgery | Admitting: Neurosurgery

## 2022-07-16 DIAGNOSIS — M546 Pain in thoracic spine: Secondary | ICD-10-CM

## 2022-07-16 DIAGNOSIS — M47814 Spondylosis without myelopathy or radiculopathy, thoracic region: Secondary | ICD-10-CM | POA: Diagnosis not present

## 2022-07-16 MED ORDER — TRIAMCINOLONE ACETONIDE 40 MG/ML IJ SUSP (RADIOLOGY)
60.0000 mg | Freq: Once | INTRAMUSCULAR | Status: AC
  Administered 2022-07-16: 60 mg via EPIDURAL

## 2022-07-16 MED ORDER — IOPAMIDOL (ISOVUE-M 300) INJECTION 61%
1.0000 mL | Freq: Once | INTRAMUSCULAR | Status: AC
  Administered 2022-07-16: 1 mL via EPIDURAL

## 2022-07-16 NOTE — Discharge Instructions (Signed)

## 2022-07-17 DIAGNOSIS — M67432 Ganglion, left wrist: Secondary | ICD-10-CM | POA: Diagnosis not present

## 2022-08-01 ENCOUNTER — Encounter: Payer: Self-pay | Admitting: Pharmacist

## 2022-08-01 ENCOUNTER — Telehealth: Payer: Self-pay | Admitting: Internal Medicine

## 2022-08-01 DIAGNOSIS — R931 Abnormal findings on diagnostic imaging of heart and coronary circulation: Secondary | ICD-10-CM

## 2022-08-01 DIAGNOSIS — E785 Hyperlipidemia, unspecified: Secondary | ICD-10-CM

## 2022-08-01 DIAGNOSIS — E7849 Other hyperlipidemia: Secondary | ICD-10-CM

## 2022-08-01 NOTE — Progress Notes (Signed)
[facility]) [facility] Quality Pharmacy Team Statin Quality Measure Assessment  08/01/2022  Tonya Hall 12/02/1950 [id number]  Per review of chart and payor information, patient has a diagnosis of diabetes but is not currently filling a statin prescription.  This places patient into the Statin Use In Patients with Diabetes (SUPD) measure for CMS.    Patient has documented trials of atorvastatin and rosuvastatin with reported muscle and joint aches, but no corresponding CPT codes that would exclude patient from SUPD measure.   The ASCVD Risk score (Arnett DK, et al., 2019) failed to calculate for the following reasons:   Cannot find a previous HDL lab No results found for requested labs within last 1095 days.     Component Value Date/Time   TRIG 289 (H) 02/06/2019 1712    Please consider ONE of the following recommendations:  Initiate high intensity statin Atorvastatin 40 mg once daily, #90, 3 refills   Rosuvastatin 20 mg once daily, #90, 3 refills    Initiate moderate intensity          statin with reduced frequency if prior          statin intolerance 1x weekly, #13, 3 refills   2x weekly, #26, 3 refills   3x weekly, #39, 3 refills    Code for past statin intolerance or  other exclusions (required annually)  Provider Requirements: Associate code during an office visit or telehealth encounter  Drug Induced Myopathy G72.0   Myopathy, unspecified G72.9   Myositis, unspecified M60.9   Rhabdomyolysis M62.82   Cirrhosis of liver K74.69   Prediabetes R73.03   PCOS E28.2   Thank you for allowing [facility] pharmacy to be a part of this patient's care.   Reynold Bowen, PharmD [facility]  [facility] Clinical Pharmacist Direct Dial: [phone]

## 2022-08-01 NOTE — Telephone Encounter (Signed)
Called patient  , had to leave message on voicemail--  yes patient will need to have lab prior to appointment on 08/03/22.  Lab has been ordered. Patient can come to the [place] office or go to [facility] in [place].  Any question may call back

## 2022-08-01 NOTE — Telephone Encounter (Signed)
Patient is calling to get clarification as to her upcoming appt and if she will need labs for this appt. Please advise.

## 2022-08-02 DIAGNOSIS — R931 Abnormal findings on diagnostic imaging of heart and coronary circulation: Secondary | ICD-10-CM | POA: Diagnosis not present

## 2022-08-02 DIAGNOSIS — E785 Hyperlipidemia, unspecified: Secondary | ICD-10-CM | POA: Diagnosis not present

## 2022-08-02 DIAGNOSIS — E7849 Other hyperlipidemia: Secondary | ICD-10-CM | POA: Diagnosis not present

## 2022-08-03 ENCOUNTER — Ambulatory Visit: Payer: PPO | Admitting: Internal Medicine

## 2022-08-03 ENCOUNTER — Encounter: Payer: Self-pay | Admitting: Internal Medicine

## 2022-08-03 VITALS — BP 130/80 | HR 90 | Wt 124.0 lb

## 2022-08-03 LAB — NMR, LIPOPROFILE
Cholesterol, Total: 197 mg/dL (ref 100–199)
HDL Particle Number: 38.2 umol/L (ref >=30.5)
HDL-C: 50 mg/dL (ref >39)
LDL Particle Number: 1142 nmol/L — ABNORMAL HIGH (ref <1000)
LDL Size: 20.6 nm (ref >20.5)
LDL-C (NIH Calc): 113 mg/dL — ABNORMAL HIGH (ref 0–99)
LP-IR Score: 67 — ABNORMAL HIGH (ref <=45)
Small LDL Particle Number: 423 nmol/L (ref <=527)
Triglycerides: 195 mg/dL — ABNORMAL HIGH (ref 0–149)

## 2022-08-03 NOTE — Progress Notes (Signed)
Erroneous encounter - patient cancelled after encounter was opened

## 2022-08-08 ENCOUNTER — Encounter: Payer: Self-pay | Admitting: Internal Medicine

## 2022-08-08 ENCOUNTER — Encounter: Payer: Self-pay | Admitting: Gastroenterology

## 2022-08-13 ENCOUNTER — Telehealth: Payer: Self-pay | Admitting: Physician Assistant

## 2022-08-13 ENCOUNTER — Other Ambulatory Visit: Payer: Self-pay | Admitting: *Deleted

## 2022-08-13 DIAGNOSIS — L409 Psoriasis, unspecified: Secondary | ICD-10-CM

## 2022-08-13 MED ORDER — OTEZLA 30 MG PO TABS: 30.0000 mg | ORAL_TABLET | Freq: Two times a day (BID) | ORAL | 0 refills | Status: DC

## 2022-08-13 NOTE — Telephone Encounter (Signed)
Tonya Hall called requesting a refill for [place] to the pharmacy on file.

## 2022-08-13 NOTE — Telephone Encounter (Signed)
Last Fill: 05/25/2022  Labs: 06/20/2022 CBC stable. On long term prednisone. Calcium remains borderline elevated but has improved.  Rest of CMP stable.  Next Visit: 09/20/2022  Last Visit: 06/20/2022  DX:  Psoriatic arthritis   Current Dose per office note 06/20/2022: Otezla 30 mg 1 tablet by mouth twice daily   Okay to refill [place]?

## 2022-08-16 DIAGNOSIS — M67432 Ganglion, left wrist: Secondary | ICD-10-CM | POA: Diagnosis not present

## 2022-08-24 ENCOUNTER — Other Ambulatory Visit (HOSPITAL_BASED_OUTPATIENT_CLINIC_OR_DEPARTMENT_OTHER): Payer: Self-pay | Admitting: Obstetrics & Gynecology

## 2022-08-24 DIAGNOSIS — M273 Alveolitis of jaws: Secondary | ICD-10-CM | POA: Diagnosis not present

## 2022-08-29 ENCOUNTER — Other Ambulatory Visit (HOSPITAL_COMMUNITY): Payer: Self-pay | Admitting: Obstetrics & Gynecology

## 2022-08-29 DIAGNOSIS — Z1231 Encounter for screening mammogram for malignant neoplasm of breast: Secondary | ICD-10-CM

## 2022-08-30 DIAGNOSIS — L720 Epidermal cyst: Secondary | ICD-10-CM | POA: Diagnosis not present

## 2022-08-30 DIAGNOSIS — L821 Other seborrheic keratosis: Secondary | ICD-10-CM | POA: Diagnosis not present

## 2022-08-30 DIAGNOSIS — Z86018 Personal history of other benign neoplasm: Secondary | ICD-10-CM | POA: Diagnosis not present

## 2022-08-30 DIAGNOSIS — D2272 Melanocytic nevi of left lower limb, including hip: Secondary | ICD-10-CM | POA: Diagnosis not present

## 2022-08-30 DIAGNOSIS — L578 Other skin changes due to chronic exposure to nonionizing radiation: Secondary | ICD-10-CM | POA: Diagnosis not present

## 2022-08-30 DIAGNOSIS — D2262 Melanocytic nevi of left upper limb, including shoulder: Secondary | ICD-10-CM | POA: Diagnosis not present

## 2022-08-30 DIAGNOSIS — D225 Melanocytic nevi of trunk: Secondary | ICD-10-CM | POA: Diagnosis not present

## 2022-09-01 ENCOUNTER — Other Ambulatory Visit: Payer: Self-pay | Admitting: Physician Assistant

## 2022-09-03 DIAGNOSIS — E119 Type 2 diabetes mellitus without complications: Secondary | ICD-10-CM | POA: Diagnosis not present

## 2022-09-03 NOTE — Telephone Encounter (Signed)
Last Fill: 06/11/2022  Next Visit: 09/20/2022  Last Visit: 06/20/2022  Dx: Chronic pain syndrome   Current Dose per office note on 06/20/2022: not mentioned  Okay to refill Topamax?

## 2022-09-07 NOTE — Progress Notes (Unsigned)
Office Visit Note  Patient: Tonya Hall             Date of Birth: 15-Oct-1950           MRN: [id number]             PCP: Carylon Perches, MD Referring: Carylon Perches, MD Visit Date: 09/20/2022 Occupation: @GUAROCC @  Subjective:  Pain in multiple joints   History of Present Illness: Tonya Hall is a 72 y.o. female with history of psoriatic arthritis.  Patient is prescribed Taltz 80 mg sq injections q 4 weeks, Arava 20mg  1 tablet daily, Otezla 30 mg 1 tablet by mouth twice daily, and prednisone 5 mg 1 tablet daily.  Patient reports that she recently was 2 to 3 weeks overdue for her Taltz injection and Arava due to a dental infection.  She was treated with a 10-day course of amoxicillin which she has completed.  Patient states that she resume both medications this week but states that she has had some increased pain in both hands over the past several days.  She states that she was evaluated yesterday by Dr. Alice Reichert at which time she was having increased pain in the left SI joint.  She states that she took a tablet of Norco yesterday which has alleviated her discomfort.  She denies any Achilles tendinitis or plantar fasciitis.  She remains under the care of Dr. Allena Katz and had a follow-up visit yesterday at which time she was advised to wear proper fitting shoes and avoid going barefoot.  She continues to have intermittent myalgias and muscle tenderness.  She has been experiencing intermittent muscle spasms in her thoracic spine.  She remains under the care of Dr. Lovell Sheehan for management of chronic thoracic and lumbar spine pain. Patient states her last Prolia injection was administered in February 2024.  She is taking a calcium and vitamin D supplement daily.    Activities of Daily Living:  Patient reports morning stiffness for all day. Patient Reports nocturnal pain.  Difficulty dressing/grooming: Reports Difficulty climbing stairs: Reports Difficulty getting out of chair: Reports Difficulty  using hands for taps, buttons, cutlery, and/or writing: Reports  Review of Systems  Constitutional:  Positive for fatigue.  HENT:  Positive for mouth dryness. Negative for mouth sores.   Eyes:  Positive for dryness.  Respiratory:  Negative for shortness of breath.   Cardiovascular:  Negative for chest pain and palpitations.  Gastrointestinal:  Negative for blood in stool, constipation and diarrhea.  Endocrine: Negative for increased urination.  Genitourinary:  Negative for involuntary urination.  Musculoskeletal:  Positive for joint pain, gait problem, joint pain, joint swelling, myalgias, muscle weakness, morning stiffness, muscle tenderness and myalgias.  Skin:  Positive for color change. Negative for rash, hair loss and sensitivity to sunlight.  Allergic/Immunologic: Negative for susceptible to infections.  Neurological:  Negative for dizziness and headaches.  Hematological:  Positive for swollen glands.  Psychiatric/Behavioral:  Negative for depressed mood and sleep disturbance. The patient is not nervous/anxious.     PMFS History:  Patient Active Problem List   Diagnosis Date Noted   Statin not tolerated 10/23/2021   Chronic allergic conjunctivitis 07/29/2021   Vasomotor rhinitis 07/29/2021   Benign neoplasm of skin of lower limb 05/17/2021   History of neoplasm 05/17/2021   Melanocytic nevi of left upper limb, including shoulder 05/17/2021   Melanocytic nevi of trunk 05/17/2021   Other seborrheic keratosis 05/17/2021   Diarrhea 03/10/2021   Fracture of pelvis (HCC) 01/27/2021  Pain of left hip joint 01/06/2021   Foraminal stenosis of lumbar region 08/03/2020   Cervical spondylosis with myelopathy and radiculopathy 03/09/2020   AKI (acute kidney injury) (HCC) 02/07/2019   Acute respiratory failure with hypoxia (HCC) 02/06/2019   Acute respiratory disease due to COVID-19 virus 02/06/2019   Community acquired pneumonia 02/06/2019   Hyponatremia 02/06/2019   Asthma     Diabetes mellitus without complication (HCC)    Closed nondisplaced odontoid fracture with type II morphology (HCC) 01/27/2018   Odontoid fracture (HCC) 01/24/2018   Chest pain 11/14/2017   Transaminitis 06/22/2016   Collagenous colitis 05/23/2016   Psoriasis 05/21/2016   High risk medication use 02/15/2016   Age-related osteoporosis without current pathological fracture 02/15/2016   Trochanteric bursitis, left hip 02/15/2016   Sacroiliitis, not elsewhere classified (HCC) 02/15/2016   Chronic pain syndrome 02/15/2016   Abnormal weight gain 06/25/2012   Hypertension 07/03/2011   Hypercholesteremia 07/03/2011   Psoriatic arthritis (HCC) 07/03/2011   Osteoarthritis of multiple joints 07/03/2011   Esophageal motility disorder 02/14/2011   Cough 02/14/2011   GERD 05/05/2010    Past Medical History:  Diagnosis Date   Asthma    Albuterol in haler prn   Cataract    immature unsure which eye   Chronic back pain    COVID-19 October/November 2020   Degenerative disk disease    psoriatic   Diabetes mellitus without complication (HCC)    diet controlled   Esophageal motility disorder    Non-specific, see modified barium study/speech path, BP   Fibromyalgia    GERD (gastroesophageal reflux disease)    takes Protonix bid   Headache 2019   History of blood transfusion    post c-section   History of bronchitis    HTN (hypertension)    Hyperlipidemia    takes Pravastatin daily   Lymphocytic colitis 05/26/2010   Responded to Entocort x 3 MOS   Neuropathy    Nonallergic rhinitis    Osteoporosis    gets Boniva every 3 months   Peripheral edema    takes HCTZ daily   Pneumonia 02/2019   Psoriatic arthritis (HCC)    Dr. Anthonette Legato   Psoriatic arthritis [facility]    bilateral hands   Raynaud's disease    Seasonal allergies    Shingles 2020   SUI (stress urinary incontinence, female)    Urinary urgency     Family History  Problem Relation Age of Onset   Diabetes Mother     Pancreatitis Mother    Arthritis Mother        psoriatic    Fibromyalgia Mother    Rheumatic fever Father    Diabetes Other        grandparents   Skin cancer Other        grandfather   Hypertension Other        grandparent   Congestive Heart Failure Other        grandfather   Parkinson's disease Other        grandmother   Colon cancer Maternal Uncle    Fibromyalgia Sister    Rheum arthritis Sister    Diabetes Sister    Fibromyalgia Sister    Diabetes Sister    Rheum arthritis Sister    Hypertension Son    Colon polyps Neg Hx    Past Surgical History:  Procedure Laterality Date   ANTERIOR CERVICAL DECOMP/DISCECTOMY FUSION N/A 03/09/2020   Procedure: ANTERIOR CERVICAL DECOMPRESSION/DISCECTOMY FUSION, INTERBODY PROSTHESIS, PLATE SCREWS CERVICAL FIVE-SIX, CERVICAL  SIX-SEVEN;  Surgeon: Tressie Stalker, MD;  Location: [facility] OR;  Service: Neurosurgery;  Laterality: N/A;   BACK SURGERY  2003   BIOPSY  04/17/2016   Procedure: BIOPSY;  Surgeon: West Bali, MD;  Location: [facility];  Service: Endoscopy;;  random colon bx's   CERVICAL SPINE SURGERY  09/09/2017   CESAREAN SECTION  1974, 1978       COLONOSCOPY  06/19/2008   ZOX:WRUEA internal hemorrhoids/mild sigmoin colon diverticulosis   COLONOSCOPY  2012   Dr. Jena Gauss: normal rectum, diverticula, lymphocytic colitis    COLONOSCOPY WITH PROPOFOL N/A 04/17/2016   Dr. Darrick Penna: Normal terminal ileum, internal/external hemorrhoids, random colon biopsies consistent with collagenous colitis   DILATION AND CURETTAGE OF UTERUS  1977   spontaneous abortion   ESOPHAGOGASTRODUODENOSCOPY  06/19/2008   VWU:[id number] gastritis   LUMBAR DISC ARTHROPLASTY  09/1996   LUMBAR FUSION  6/03, 11/08, 11/14   L4-5 fusion, L2-3 fusion, L1-2   LUMBAR FUSION  08/03/2020   L4-L5   LUMBAR LAMINECTOMY  03/2020   MOUTH SURGERY  12/07/2021   oral surgery   ODONTOID SCREW INSERTION N/A 01/27/2018   Procedure: ODONTOID SCREW INSERTION;  Surgeon: Tressie Stalker, MD;  Location: [facility] OR;  Service: Neurosurgery;  Laterality: N/A;  ODONTOID SCREW INSERTION   TONSILLECTOMY     TONSILLECTOMY AND ADENOIDECTOMY     TUBAL LIGATION     Social History   Social History Narrative   ATTENDS [employer]. 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   Immunization History  Administered Date(s) Administered   Influenza,inj,Quad PF,6+ Mos 01/21/2017   Influenza-Unspecified 01/30/2017   Moderna Sars-Covid-2 Vaccination 06/01/2019, 07/02/2019   Pneumococcal Polysaccharide-23 02/19/2013   Tdap 09/10/2016     Objective: Vital Signs: BP 126/73 (BP Location: Left Arm, Patient Position: Sitting, Cuff Size: Normal)   Pulse 80   Ht 4\' 9"  (1.448 m)   Wt 127 lb (57.6 kg)   LMP 02/01/1999   BMI 27.48 kg/m    Physical Exam Vitals and nursing note reviewed.  Constitutional:      Appearance: She is well-developed.  HENT:     Head: Normocephalic and atraumatic.  Eyes:     Conjunctiva/sclera: Conjunctivae normal.  Cardiovascular:     Rate and Rhythm: Normal rate and regular rhythm.     Heart sounds: Normal heart sounds.  Pulmonary:     Effort: Pulmonary effort is normal.     Breath sounds: Normal breath sounds.  Abdominal:     General: Bowel sounds are normal.     Palpations: Abdomen is soft.  Musculoskeletal:     Cervical back: Normal range of motion.  Lymphadenopathy:     Cervical: No cervical adenopathy.  Skin:    General: Skin is warm and dry.     Capillary Refill: Capillary refill takes less than 2 seconds.  Neurological:     Mental Status: She is alert and oriented to person, place, and time.  Psychiatric:        Behavior: Behavior normal.      Musculoskeletal Exam: C-spine has limited range of motion with lateral rotation.  Thoracic kyphosis.  Painful limited mobility of the thoracic and lumbar spine.  Shoulder joints have good range of motion.  Elbow joints have good range of motion with no tenderness along the elbow joint line.  Limited  range of motion of both wrist joints.  Severe PIP and DIP thickening.  Incomplete fist formation.  Left hip has slightly limited range of motion.  Tenderness over the  left trochanteric bursa.  Knee joints have good range of motion with no warmth or effusion.  Ankle joints have good range of motion with no joint tenderness.  No tenderness or synovitis over MTP joints.  No evidence of Achilles tendinitis or plantar fasciitis.  CDAI Exam: CDAI Score: -- Patient Global: --; Provider Global: -- Swollen: --; Tender: -- Joint Exam 09/20/2022   No joint exam has been documented for this visit   There is currently no information documented on the homunculus. Go to the Rheumatology activity and complete the homunculus joint exam.  Investigation: No additional findings.  Imaging: MM 3D SCREENING MAMMOGRAM BILATERAL BREAST  Result Date: 09/17/2022 CLINICAL DATA:  Screening. EXAM: DIGITAL SCREENING BILATERAL MAMMOGRAM WITH TOMOSYNTHESIS AND CAD TECHNIQUE: Bilateral screening digital craniocaudal and mediolateral oblique mammograms were obtained. Bilateral screening digital breast tomosynthesis was performed. The images were evaluated with computer-aided detection. COMPARISON:  Previous exam(s). ACR Breast Density Category c: The breasts are heterogeneously dense, which may obscure small masses. FINDINGS: There are no findings suspicious for malignancy. IMPRESSION: No mammographic evidence of malignancy. A result letter of this screening mammogram will be mailed directly to the patient. RECOMMENDATION: Screening mammogram in one year. (Code:SM-B-01Y) BI-RADS CATEGORY  1: Negative. Electronically Signed   By: Meda Klinefelter M.D.   On: 09/17/2022 12:48    Recent Labs: Lab Results  Component Value Date   WBC 13.3 (H) 06/20/2022   HGB 13.6 06/20/2022   PLT 298 06/20/2022   NA 136 06/20/2022   K 4.1 06/20/2022   CL 104 06/20/2022   CO2 21 06/20/2022   GLUCOSE 95 06/20/2022   BUN 32 (H) 06/20/2022    CREATININE 0.97 06/20/2022   BILITOT 0.3 06/20/2022   ALKPHOS 70 12/21/2020   AST 20 06/20/2022   ALT 24 06/20/2022   PROT 7.2 06/20/2022   ALBUMIN 4.4 12/21/2020   CALCIUM 10.5 (H) 06/20/2022   GFRAA 65 06/08/2020   QFTBGOLD Negative 05/05/2015   QFTBGOLDPLUS NEGATIVE 09/20/2021    Speciality Comments: MTX- high LFTs, PLQ,SSZ,Imuran, Humira- allergy, Enbrel , Remicade - inadequate response  Procedures:  No procedures performed Allergies: Adalimumab, Imuran [azathioprine sodium], Methotrexate, Plaquenil [hydroxychloroquine sulfate], Sulfonamide derivatives, Azathioprine, Ceftin [cefuroxime axetil], Cefuroxime, Lisinopril, Sulfamethoxazole-trimethoprim, and Morphine   Assessment / Plan:     Visit Diagnoses: Psoriatic arthritis [facility]): Patient presents today with increased pain in both hands for the past several days.  She recently had a gap in therapy and had to postpone Taltz injection by 2 to 3 weeks as well as held arava for 2 to 3 weeks while taking a prescription of amoxicillin x 10 days for a dental infection.  She has remained on Otezla 30 mg 1 tablet by mouth twice daily along with prednisone 5 mg 1 tablet daily.  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  Overall her psoriatic arthritis remains stable on the current treatment regimen when she is able to continue therapy without interruptions.  She has no active psoriasis.  She experiences intermittent pain in the left SI joint.  No evidence of Achilles tendinitis or plantar fasciitis.  Overall her pain level has been better controlled since establishing care with Dr. Berline Chough for pain management. She will remain on the current treatment regimen.  She was advised to notify us if she develops signs or symptoms of a flare.  She will follow-up in the office in 3 months or sooner if needed.  Psoriasis: No active psoriasis   High risk medication use - Taltz 80 mg  sq injections q 4 weeks, Arava 20mg  1 tablet daily, Otezla 30 mg 1 tablet by mouth  twice daily, and prednisone 5 mg 1 tablet daily. TB gold negative on 09/20/21.  Order for TB Gold released today. CBC and CMP updated on 06/20/22.  Orders for CBC and CMP released today.  Her next lab work will be due in September and every 3 months to monitor for toxicity. Discussed the importance of holding taltz and arava if she develops signs or symptoms of an infection and to resume once the infection has completely cleared.   - Plan: CBC with Differential/Platelet, COMPLETE METABOLIC PANEL WITH GFR, QuantiFERON-TB Gold Plus  Screening for tuberculosis -Order for TB Gold released today.  Plan: QuantiFERON-TB Gold Plus  Chronic left SI joint pain: Patient had a flare involving the left SI joint yesterday.  She took a dose of Norco which has alleviated her discomfort.  She declined a cortisone injection at this time.  Primary osteoarthritis of both knees: She has good range of motion of both knee joints on examination today.  No warmth or effusion noted. Patient underwent Visco gel injections for both knees in October 2023 which provided significant relief.  She will notify us when and if she would like to reapply for Viscosupplementation in the future.  DDD (degenerative disc disease), cervical: Limited range of motion with lateral rotation.   DDD (degenerative disc disease), thoracic: Chronic pain.  Remains under the care of Dr. Lovell Sheehan.  She has had injections in the past which have only provided temporary relief.  She has been experiencing muscle spasms intermittently in the thoracic region.  DDD (degenerative disc disease), lumbar - Followed by Dr. Lovell Sheehan and Dr. Berline Chough.  Trochanteric bursitis of both hips: She continues to experience intermittent discomfort in the left hip secondary to trochanteric bursitis.  Raynaud's syndrome without gangrene: Not currently active.  No digital ulcerations noted.  Age-related osteoporosis without current pathological fracture - Followed by Dr. Talmage Nap.   She remains on Prolia 60 mg sq injections.  Her last dose of Prolia was administered in February 2024.  According to the patient there has been discussion of switching from Prolia to [facility] in the future.  Other medical conditions are listed as follows:   Essential hypertension: Blood pressure is 126/73 today in the office.  Chronic pain syndrome  Esophageal motility disorder  History of type 2 diabetes mellitus  Adrenal insufficiency (HCC)  History of gastroesophageal reflux (GERD)  Collagenous colitis  Senile purpura (HCC)  Pedal edema  Orders: Orders Placed This Encounter  Procedures   CBC with Differential/Platelet   COMPLETE METABOLIC PANEL WITH GFR   QuantiFERON-TB Gold Plus   No orders of the defined types were placed in this encounter.    Follow-Up Instructions: Return in about 3 months (around 12/21/2022) for Psoriatic arthritis.   Gearldine Bienenstock, PA-C  Note - This record has been created using Dragon software.  Chart creation errors have been sought, but may not always  have been located. Such creation errors do not reflect on  the standard of medical care.

## 2022-09-10 DIAGNOSIS — E119 Type 2 diabetes mellitus without complications: Secondary | ICD-10-CM | POA: Diagnosis not present

## 2022-09-10 DIAGNOSIS — M810 Age-related osteoporosis without current pathological fracture: Secondary | ICD-10-CM | POA: Diagnosis not present

## 2022-09-10 DIAGNOSIS — E7800 Pure hypercholesterolemia, unspecified: Secondary | ICD-10-CM | POA: Diagnosis not present

## 2022-09-10 DIAGNOSIS — I10 Essential (primary) hypertension: Secondary | ICD-10-CM | POA: Diagnosis not present

## 2022-09-12 ENCOUNTER — Ambulatory Visit: Payer: PPO | Admitting: Podiatry

## 2022-09-13 ENCOUNTER — Ambulatory Visit (HOSPITAL_COMMUNITY)
Admission: RE | Admit: 2022-09-13 | Discharge: 2022-09-13 | Disposition: A | Discharge status: Home / Self Care | Payer: PPO | Source: Ambulatory Visit | Attending: Obstetrics & Gynecology | Admitting: Obstetrics & Gynecology

## 2022-09-13 ENCOUNTER — Ambulatory Visit (INDEPENDENT_AMBULATORY_CARE_PROVIDER_SITE_OTHER): Payer: PPO | Admitting: Gastroenterology

## 2022-09-13 ENCOUNTER — Encounter: Payer: Self-pay | Admitting: Gastroenterology

## 2022-09-13 VITALS — BP 131/75 | HR 76 | Temp 97.7°F | Ht <= 58 in | Wt 126.6 lb

## 2022-09-13 DIAGNOSIS — K219 Gastro-esophageal reflux disease without esophagitis: Secondary | ICD-10-CM | POA: Diagnosis not present

## 2022-09-13 DIAGNOSIS — Z1231 Encounter for screening mammogram for malignant neoplasm of breast: Secondary | ICD-10-CM | POA: Diagnosis not present

## 2022-09-13 NOTE — Progress Notes (Addendum)
Gastroenterology Office Note     Primary Care Physician:  Carylon Perches, MD  Primary Gastroenterologist: Dr. Marletta Lor    Chief Complaint   Chief Complaint  Patient presents with   Follow-up    Having some abdominal pain     History of Present Illness   Tonya Hall is a 72 y.o. female presenting today with a history of collagenous colitis, GERD, and gastritis.  Originally diagnosed with lymphocytic colitis on colonoscopy in 2012.  Last colonoscopy in 2018, normal terminal ileum, hemorrhoids, random colon biopsies consistent with collagenous colitis.  She has been on low-dose prednisone for years for psoriatic arthritis, unable to be weaned off due to adrenal insufficiency, followed by Dr. Romero Belling with endocrinology.   GERD: pantoprazole BID. Rare solid food dysphagia. Pill dysphagia noted. Still has breakthrough GERD. Nocturnal GERD once every 3 weeks. HOB on risers.  Barium pill esophagram in 2012 showed diffuse impairment of esophageal motility with incomplete clearance of barium by primary peristaltic waves.  Numerous secondary and tertiary waves noted.  Modified barium swallow study in July 2012 showed flash laryngeal penetration of liquid component with swallowing of pills, flash laryngeal penetration without aspiration or cough with thin barium by cup.   Has intermittent thoracic back pain and radiates around ribs. Had injection last year and pain improved over 3-4 months. Additional injection in January with some improvement but didn't stay resolved as long. April 2024 injection without much improvement. No postprandial abdominal pain, N/V.    Past Medical History:  Diagnosis Date   Asthma    Albuterol in haler prn   Cataract    immature unsure which eye   Chronic back pain    COVID-19 October/November 2020   Degenerative disk disease    psoriatic   Diabetes mellitus without complication (HCC)    diet controlled   Esophageal motility disorder    Non-specific, see  modified barium study/speech path, BP   Fibromyalgia    GERD (gastroesophageal reflux disease)    takes Protonix bid   Headache 2019   History of blood transfusion    post c-section   History of bronchitis    HTN (hypertension)    Hyperlipidemia    takes Pravastatin daily   Lymphocytic colitis 05/26/2010   Responded to Entocort x 3 MOS   Neuropathy    Nonallergic rhinitis    Osteoporosis    gets Boniva every 3 months   Peripheral edema    takes HCTZ daily   Pneumonia 02/2019   Psoriatic arthritis (HCC)    Dr. Anthonette Legato   Psoriatic arthritis [facility])    bilateral hands   Raynaud's disease    Seasonal allergies    Shingles 2020   SUI (stress urinary incontinence, female)    Urinary urgency     Past Surgical History:  Procedure Laterality Date   ANTERIOR CERVICAL DECOMP/DISCECTOMY FUSION N/A 03/09/2020   Procedure: ANTERIOR CERVICAL DECOMPRESSION/DISCECTOMY FUSION, INTERBODY PROSTHESIS, PLATE SCREWS CERVICAL FIVE-SIX, CERVICAL SIX-SEVEN;  Surgeon: Tressie Stalker, MD;  Location: [facility] OR;  Service: Neurosurgery;  Laterality: N/A;   BACK SURGERY  2003   BIOPSY  04/17/2016   Procedure: BIOPSY;  Surgeon: West Bali, MD;  [facility];  Service: Endoscopy;;  random colon bx's   CERVICAL SPINE SURGERY  09/09/2017   CESAREAN SECTION  1974, 1978       COLONOSCOPY  06/19/2008   ZOX:WRUEA internal hemorrhoids/mild sigmoin colon diverticulosis   COLONOSCOPY  2012   Dr. Jena Gauss: normal rectum,  diverticula, lymphocytic colitis    COLONOSCOPY WITH PROPOFOL N/A 04/17/2016   Dr. Darrick Penna: Normal terminal ileum, internal/external hemorrhoids, random colon biopsies consistent with collagenous colitis   DILATION AND CURETTAGE OF UTERUS  1977   spontaneous abortion   ESOPHAGOGASTRODUODENOSCOPY  06/19/2008   [id number] gastritis   LUMBAR DISC ARTHROPLASTY  09/1996   LUMBAR FUSION  6/03, 11/08, 11/14   L4-5 fusion, L2-3 fusion, L1-2   LUMBAR FUSION  08/03/2020   L4-L5   LUMBAR  LAMINECTOMY  03/2020   MOUTH SURGERY  12/07/2021   oral surgery   ODONTOID SCREW INSERTION N/A 01/27/2018   Procedure: ODONTOID SCREW INSERTION;  Surgeon: Tressie Stalker, MD;  Location: [facility] OR;  Service: Neurosurgery;  Laterality: N/A;  ODONTOID SCREW INSERTION   TONSILLECTOMY     TONSILLECTOMY AND ADENOIDECTOMY     TUBAL LIGATION      Current Outpatient Medications  Medication Sig Dispense Refill   albuterol (PROVENTIL HFA;VENTOLIN HFA) 108 (90 BASE) MCG/ACT inhaler Inhale 2 puffs into the lungs every 6 (six) hours as needed for wheezing or shortness of breath.      Apremilast (OTEZLA) 30 MG TABS Take 1 tablet (30 mg total) by mouth 2 (two) times daily. 180 tablet 0   aspirin 81 MG tablet Take 81 mg by mouth daily.     Blood Glucose Monitoring Suppl (ONETOUCH VERIO FLEX SYSTEM) w/Device KIT      Cetirizine HCl 10 MG CAPS Take by oral route.     chlorhexidine (PERIDEX) 0.12 % solution SMARTSIG:0.5 Ounce(s) By Mouth Twice Daily     Evolocumab (REPATHA SURECLICK) 140 MG/ML SOAJ Inject 1 Dose into the skin every 14 (fourteen) days. 6 mL 3   fish oil-omega-3 fatty acids 1000 MG capsule Take 2 capsules (2 g total) by mouth daily. 2-4 GRAMS DAILY (Patient taking differently: Take 1 g by mouth 2 (two) times daily. 2-4 GRAMS DAILY)     gabapentin (NEURONTIN) 600 MG tablet Take 1 tablet (600 mg total) by mouth 3 (three) times daily. To take with lyrica- lower doses on both, so has less side effects- for nerve pain 90 tablet 5   Ginger, Zingiber officinalis, (GINGER ROOT) 500 MG CAPS Take 500 mg by mouth daily.     Glucosamine Sulfate 1000 MG CAPS Take 2,000 mg by mouth daily.     HYDROcodone-acetaminophen (NORCO/VICODIN) 5-325 MG tablet Take 1 tablet by mouth 3 (three) times daily as needed. 21 tablet 0   hydrocortisone sodium succinate (SOLU-CORTEF) 100 MG SOLR injection Inject 100 mg into the muscle daily as needed (when unable to keep down prednisone tablet).      Lancets (ONETOUCH DELICA PLUS  LANCET33G) MISC USE TO TEST 3 TIMESUDAILY.     leflunomide (ARAVA) 20 MG tablet TAKE 1 TABLET BY MOUTH ONCE DAILY. 90 tablet 0   losartan (COZAAR) 100 MG tablet Take by mouth.     metoprolol succinate (TOPROL-XL) 50 MG 24 hr tablet Take 1 tablet (50 mg total) by mouth daily. Take with or immediately following a meal.     Misc Natural Products (TART CHERRY ADVANCED PO) Take 1 tablet by mouth daily. daily     Multiple Vitamin (MULTIVITAMIN) tablet Take 1 tablet by mouth daily.     nortriptyline (PAMELOR) 25 MG capsule TAKE (1) CAPSULE BY MOUTH AT BEDTIME. (Patient taking differently: Take 25 mg by mouth at bedtime.) 30 capsule 0   ONETOUCH VERIO test strip      oxybutynin (DITROPAN-XL) 5 MG 24 hr tablet TAKE  1 TABLET BY MOUTH AT BEDTIME. 90 tablet 1   OZEMPIC, 0.25 OR 0.5 MG/DOSE, 2 MG/3ML SOPN SMARTSIG:0.25 Milligram(s) SUB-Q Once a Week     pantoprazole (PROTONIX) 40 MG tablet Take 1 tablet (40 mg total) by mouth 2 (two) times daily before a meal. 30 minutes prior 180 tablet 3   Polyethyl Glycol-Propyl Glycol (SYSTANE OP) Apply to eye 4 (four) times daily.     potassium chloride SA (K-DUR,KLOR-CON) 20 MEQ tablet Take 20 mEq by mouth daily.      predniSONE (DELTASONE) 5 MG tablet TAKE 1 TABLET BY MOUTH DAILY WITH BREAKFAST 90 tablet 0   pregabalin (LYRICA) 100 MG capsule Take 1 capsule (100 mg total) by mouth 3 (three) times daily. Take WITH gabapentin- both at lower doses to reduce side effects- for nerve pain 90 capsule 5   spironolactone (ALDACTONE) 25 MG tablet Take 25 mg by mouth daily.     SURE COMFORT PEN NEEDLES 31G X 5 MM MISC      TALTZ 80 MG/ML SOAJ INJECT 80MG  (1 PEN) UNDER THE SKIN EVERY 4 WEEKS 3 mL 0   topiramate (TOPAMAX) 200 MG tablet TAKE (1) TABLET BY MOUTH ONCE AT BEDTIME. 90 tablet 0   Turmeric 500 MG CAPS Take 500 mg by mouth every morning.     No current facility-administered medications for this visit.    Allergies as of 09/13/2022 - Review Complete 09/13/2022   Allergen Reaction Noted   Adalimumab Rash and Other (See Comments) 05/09/2021   Imuran [azathioprine sodium] Rash 07/03/2011   Methotrexate Other (See Comments) 05/09/2021   Plaquenil [hydroxychloroquine sulfate] Rash 07/03/2011   Sulfonamide derivatives Rash    Azathioprine  05/09/2021   Ceftin [cefuroxime axetil] Nausea Only 07/06/2014   Cefuroxime  05/09/2021   Ezetimibe  09/22/2021   Insulin detemir  09/22/2021   Lisinopril  09/22/2021   Metformin hcl  09/22/2021   Morphine sulfate  05/09/2021   Pravastatin sodium  09/22/2021   Sulfamethoxazole-trimethoprim  09/22/2021   Morphine Nausea And Vomiting     Family History  Problem Relation Age of Onset   Diabetes Mother    Pancreatitis Mother    Arthritis Mother        psoriatic    Fibromyalgia Mother    Rheumatic fever Father    Diabetes Other        grandparents   Skin cancer Other        grandfather   Hypertension Other        grandparent   Congestive Heart Failure Other        grandfather   Parkinson's disease Other        grandmother   Colon cancer Maternal Uncle    Fibromyalgia Sister    Rheum arthritis Sister    Diabetes Sister    Fibromyalgia Sister    Diabetes Sister    Rheum arthritis Sister    Hypertension Son    Colon polyps Neg Hx     Social History   Socioeconomic History   Marital status: Married    Spouse name: Not on file   Number of children: Not on file   Years of education: Not on file   Highest education level: Not on file  Occupational History   Occupation: Case Midwife    Employer: [employer]    Comment: Estate manager/land agent  Tobacco Use   Smoking status: Never    Passive exposure: Never   Smokeless tobacco: Never  Vaping Use  Vaping Use: Never used  Substance and Sexual Activity   Alcohol use: No   Drug use: No   Sexual activity: Not Currently    Partners: Male    Birth control/protection: Surgical    Comment: BTL  Other Topics Concern   Not on file  Social History Narrative    ATTENDS [employer]. 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   Social Determinants of Health   Financial Resource Strain: Not on file  Food Insecurity: Not on file  Transportation Needs: Not on file  Physical Activity: Not on file  Stress: Not on file  Social Connections: Not on file  Intimate Partner Violence: Not on file     Review of Systems   Gen: Denies any fever, chills, fatigue, weight loss, lack of appetite.  CV: Denies chest pain, heart palpitations, peripheral edema, syncope.  Resp: Denies shortness of breath at rest or with exertion. Denies wheezing or cough.  GI: see HPI GU : Denies urinary burning, urinary frequency, urinary hesitancy MS: Denies joint pain, muscle weakness, cramps, or limitation of movement.  Derm: Denies rash, itching, dry skin Psych: Denies depression, anxiety, memory loss, and confusion Heme: Denies bruising, bleeding, and enlarged lymph nodes.   Physical Exam   BP 131/75 (BP Location: Right Arm, Patient Position: Sitting, Cuff Size: Normal)   Pulse 76   Temp 97.7 F (36.5 C) (Oral)   Ht 4' 7.5" (1.41 m)   Wt 126 lb 9.6 oz (57.4 kg)   LMP 02/01/1999   SpO2 95%   BMI 28.90 kg/m  General:   Alert and oriented. Pleasant and cooperative. Well-nourished and well-developed.  Head:  Normocephalic and atraumatic. Eyes:  Without icterus Abdomen:  +BS, soft, non-tender and non-distended. No HSM noted. No guarding or rebound. No masses appreciated. TTP bilateral ribs Rectal:  Deferred  Msk:  with kyphosis Extremities:  Without edema. Neurologic:  Alert and  oriented x4 Skin:  Intact without significant lesions or rashes. Psych:  Alert and cooperative. Normal mood and affect.   Assessment   Tonya Hall is a 72 y.o. female with a history of collagenous colitis, GERD, and gastritis, presenting for routine follow-up.  Incidentally noting bilateral upper rib pain and wanted to ensure not GI-related. Her symptoms are consistent with  musculoskeletal origin, and she is followed by Neurosurgeon Dr. Lovell Sheehan for thoracic spine pain and has had injections with relief. However, she is having less consistent relief with each injection. Not a surgical candidate.  GERD remains refractory despite BID PPI and diet/behavior modifications. Occasional solid food dysphagia noted and pill dysphagia. Last EGD in 2010 with gastritis. She does have a history of diffuse impairment of esophageal motility on BPE in 2012. Unclear if she would be a good TIF candidate or not. I feel she would benefit from EGD +/- dilation either way. Will reach out to Dr. Levon Hedger regarding TIF candidacy.   Microscopic colitis: symptoms quiescent.      PLAN   Continue PPI BID Continue follow-up with Neurosurgery Will discuss with Dr. Levon Hedger TIF candidacy Will need EGD +/- dilation regardless Further recommendations to follow    Gelene Mink, PhD, ANP-BC [facility] Gastroenterology    Discussed with Dr. Levon Hedger. Patient is not a TIF candidate in light of prior dysmotility documented and likelihood of exacerbating this if undergoing TIF procedure. We will arrange EGD/dilation with Dr. Marletta Lor in the near future.   Gelene Mink, PhD, ANP-BC [facility] Gastroenterology

## 2022-09-13 NOTE — Patient Instructions (Addendum)
I will be talking with Dr. Levon Hedger regarding the TIF procedure and get back with you!  I recommend continuing to follow-up with Dr. Lovell Sheehan!  Further recommendations to follow!  I enjoyed seeing you again today! I value our relationship and want to provide genuine, compassionate, and quality care. You may receive a survey regarding your visit with me, and I welcome your feedback! Thanks so much for taking the time to complete this. I look forward to seeing you again.      Gelene Mink, PhD, ANP-BC [facility] Gastroenterology

## 2022-09-15 ENCOUNTER — Other Ambulatory Visit: Payer: Self-pay | Admitting: Internal Medicine

## 2022-09-18 ENCOUNTER — Ambulatory Visit (INDEPENDENT_AMBULATORY_CARE_PROVIDER_SITE_OTHER): Payer: PPO | Admitting: Podiatry

## 2022-09-18 ENCOUNTER — Telehealth: Payer: Self-pay | Admitting: Gastroenterology

## 2022-09-18 DIAGNOSIS — E119 Type 2 diabetes mellitus without complications: Secondary | ICD-10-CM

## 2022-09-18 DIAGNOSIS — Q828 Other specified congenital malformations of skin: Secondary | ICD-10-CM | POA: Diagnosis not present

## 2022-09-18 NOTE — Telephone Encounter (Signed)
Please arrange EGD/dilation with Dr. Marletta Lor, ASA 3. Dx: dysphagia.  I sent MyChart message to patient.   Will need to hold Ozempic one week prior.  Thanks!

## 2022-09-18 NOTE — Telephone Encounter (Signed)
LMTRC

## 2022-09-18 NOTE — Telephone Encounter (Signed)
noted 

## 2022-09-19 ENCOUNTER — Encounter: Payer: Self-pay | Admitting: *Deleted

## 2022-09-19 ENCOUNTER — Ambulatory Visit (INDEPENDENT_AMBULATORY_CARE_PROVIDER_SITE_OTHER): Payer: PPO

## 2022-09-19 ENCOUNTER — Encounter: Payer: Self-pay | Admitting: Physical Medicine and Rehabilitation

## 2022-09-19 ENCOUNTER — Encounter: Payer: PPO | Attending: Physical Medicine and Rehabilitation | Admitting: Physical Medicine and Rehabilitation

## 2022-09-19 ENCOUNTER — Ambulatory Visit: Payer: PPO | Admitting: Podiatry

## 2022-09-19 ENCOUNTER — Ambulatory Visit: Payer: PPO

## 2022-09-19 VITALS — BP 134/73 | HR 81 | Ht <= 58 in | Wt 128.0 lb

## 2022-09-19 DIAGNOSIS — M7741 Metatarsalgia, right foot: Secondary | ICD-10-CM | POA: Diagnosis not present

## 2022-09-19 DIAGNOSIS — L4050 Arthropathic psoriasis, unspecified: Secondary | ICD-10-CM | POA: Diagnosis not present

## 2022-09-19 DIAGNOSIS — M48061 Spinal stenosis, lumbar region without neurogenic claudication: Secondary | ICD-10-CM | POA: Diagnosis not present

## 2022-09-19 DIAGNOSIS — M461 Sacroiliitis, not elsewhere classified: Secondary | ICD-10-CM | POA: Diagnosis not present

## 2022-09-19 DIAGNOSIS — M159 Polyosteoarthritis, unspecified: Secondary | ICD-10-CM

## 2022-09-19 DIAGNOSIS — M79671 Pain in right foot: Secondary | ICD-10-CM

## 2022-09-19 MED ORDER — HYDROCODONE-ACETAMINOPHEN 5-325 MG PO TABS: 1.0000 | ORAL_TABLET | Freq: Two times a day (BID) | ORAL | 0 refills | Status: DC | PRN

## 2022-09-19 NOTE — Progress Notes (Signed)
Subjective:    Patient ID: Tonya Hall, female    DOB: 1951-01-21, 72 y.o.   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    Still taking Lyrica but taking 100 mg TID- but 1 week ago cut out middle of day dose.   Cut out middle of day dose- was falling asleep more than used to.    Doesn't feel like it's help much at all.   Took the entire of 20 tabs of Norco-  Some days it helped, but some days no difference.    Some days- feels a lot better-  When takes Norco   Also having new/acute on chronic L SI joint pain. Radiating into L hip bursa per pt-  Started this AM    Pain Inventory Average Pain 7 Pain Right Now 5 My pain is intermittent, sharp, burning, dull, and aching  In the last 24 hours, has pain interfered with the following? General activity 8 Relation with others 6 Enjoyment of life 8 What TIME of day is your pain at its worst? morning , daytime, and evening Sleep (in general) Good  Pain is worse with: bending and some activites Pain improves with: rest and heat/ice Relief from Meds: 3  Family History  Problem Relation Age of Onset   Diabetes Mother    Pancreatitis Mother    Arthritis Mother        psoriatic    Fibromyalgia Mother    Rheumatic fever Father    Diabetes Other        grandparents   Skin cancer Other        grandfather   Hypertension Other        grandparent   Congestive Heart Failure Other        grandfather   Parkinson's disease Other        grandmother   Colon cancer Maternal Uncle    Fibromyalgia Sister    Rheum arthritis Sister    Diabetes Sister    Fibromyalgia Sister    Diabetes Sister    Rheum arthritis Sister    Hypertension Son    Colon polyps Neg Hx     Social History   Socioeconomic History   Marital status: Married    Spouse name: Not on file   Number of children: Not on file   Years of education: Not on file   Highest education level: Not on file  Occupational History   Occupation: Case Midwife    Employer: [employer]    Comment: Estate manager/land agent  Tobacco Use   Smoking status: Never    Passive exposure: Never   Smokeless tobacco: Never  Vaping Use   Vaping Use: Never used  Substance and Sexual Activity   Alcohol use: No   Drug use: No   Sexual activity: Not Currently    Partners: Male    Birth control/protection: Surgical    Comment: BTL  Other Topics Concern   Not on file  Social History Narrative   ATTENDS [employer]. 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   Social Determinants of Health   Financial Resource Strain: Not on file  Food Insecurity: Not on file  Transportation Needs: Not on  file  Physical Activity: Not on file  Stress: Not on file  Social Connections: Not on file   Past Surgical History:  Procedure Laterality Date   ANTERIOR CERVICAL DECOMP/DISCECTOMY FUSION N/A 03/09/2020   Procedure: ANTERIOR CERVICAL DECOMPRESSION/DISCECTOMY FUSION, INTERBODY PROSTHESIS, PLATE SCREWS CERVICAL FIVE-SIX, CERVICAL SIX-SEVEN;  Surgeon: Tressie Stalker, MD;  Location: [facility] OR;  Service: Neurosurgery;  Laterality: N/A;   BACK SURGERY  2003   BIOPSY  04/17/2016   Procedure: BIOPSY;  Surgeon: West Bali, MD;  [facility];  Service: Endoscopy;;  random colon bx's   CERVICAL SPINE SURGERY  09/09/2017   CESAREAN SECTION  1974, 1978       COLONOSCOPY  06/19/2008   ZOX:WRUEA internal hemorrhoids/mild sigmoin colon diverticulosis   COLONOSCOPY  2012   Dr. Jena Gauss: normal rectum, diverticula, lymphocytic colitis    COLONOSCOPY WITH PROPOFOL N/A 04/17/2016   Dr. Darrick Penna: Normal terminal ileum, internal/external hemorrhoids, random colon biopsies consistent with collagenous colitis   DILATION AND CURETTAGE OF  UTERUS  1977   spontaneous abortion   ESOPHAGOGASTRODUODENOSCOPY  06/19/2008   [id number] gastritis   LUMBAR DISC ARTHROPLASTY  09/1996   LUMBAR FUSION  6/03, 11/08, 11/14   L4-5 fusion, L2-3 fusion, L1-2   LUMBAR FUSION  08/03/2020   L4-L5   LUMBAR LAMINECTOMY  03/2020   MOUTH SURGERY  12/07/2021   oral surgery   ODONTOID SCREW INSERTION N/A 01/27/2018   Procedure: ODONTOID SCREW INSERTION;  Surgeon: Tressie Stalker, MD;  Location: [facility] OR;  Service: Neurosurgery;  Laterality: N/A;  ODONTOID SCREW INSERTION   TONSILLECTOMY     TONSILLECTOMY AND ADENOIDECTOMY     TUBAL LIGATION     Past Surgical History:  Procedure Laterality Date   ANTERIOR CERVICAL DECOMP/DISCECTOMY FUSION N/A 03/09/2020   Procedure: ANTERIOR CERVICAL DECOMPRESSION/DISCECTOMY FUSION, INTERBODY PROSTHESIS, PLATE SCREWS CERVICAL FIVE-SIX, CERVICAL SIX-SEVEN;  Surgeon: Tressie Stalker, MD;  Location: [facility] OR;  Service: Neurosurgery;  Laterality: N/A;   BACK SURGERY  2003   BIOPSY  04/17/2016   Procedure: BIOPSY;  Surgeon: West Bali, MD;  [facility];  Service: Endoscopy;;  random colon bx's   CERVICAL SPINE SURGERY  09/09/2017   CESAREAN SECTION  1974, 1978       COLONOSCOPY  06/19/2008   WGN:[id number] internal hemorrhoids/mild sigmoin colon diverticulosis   COLONOSCOPY  2012   Dr. Jena Gauss: normal rectum, diverticula, lymphocytic colitis    COLONOSCOPY WITH PROPOFOL N/A 04/17/2016   Dr. Darrick Penna: Normal terminal ileum, internal/external hemorrhoids, random colon biopsies consistent with collagenous colitis   DILATION AND CURETTAGE OF UTERUS  1977   spontaneous abortion   ESOPHAGOGASTRODUODENOSCOPY  06/19/2008   YQM:[id number] gastritis   LUMBAR DISC ARTHROPLASTY  09/1996   LUMBAR FUSION  6/03, 11/08, 11/14   L4-5 fusion, L2-3 fusion, L1-2   LUMBAR FUSION  08/03/2020   L4-L5   LUMBAR LAMINECTOMY  03/2020   MOUTH SURGERY  12/07/2021   oral surgery   ODONTOID SCREW INSERTION N/A 01/27/2018    Procedure: ODONTOID SCREW INSERTION;  Surgeon: Tressie Stalker, MD;  Location: [facility] OR;  Service: Neurosurgery;  Laterality: N/A;  ODONTOID SCREW INSERTION   TONSILLECTOMY     TONSILLECTOMY AND ADENOIDECTOMY     TUBAL LIGATION     Past Medical History:  Diagnosis Date   Asthma    Albuterol in haler prn   Cataract    immature unsure which eye   Chronic back pain    COVID-19 October/November 2020   Degenerative disk disease  psoriatic   Diabetes mellitus without complication (HCC)    diet controlled   Esophageal motility disorder    Non-specific, see modified barium study/speech path, BP   Fibromyalgia    GERD (gastroesophageal reflux disease)    takes Protonix bid   Headache 2019   History of blood transfusion    post c-section   History of bronchitis    HTN (hypertension)    Hyperlipidemia    takes Pravastatin daily   Lymphocytic colitis 05/26/2010   Responded to Entocort x 3 MOS   Neuropathy    Nonallergic rhinitis    Osteoporosis    gets Boniva every 3 months   Peripheral edema    takes HCTZ daily   Pneumonia 02/2019   Psoriatic arthritis (HCC)    Dr. Anthonette Legato   Psoriatic arthritis [facility])    bilateral hands   Raynaud's disease    Seasonal allergies    Shingles 2020   SUI (stress urinary incontinence, female)    Urinary urgency    Ht 4' 7.5" (1.41 m)   LMP 02/01/1999   BMI 28.90 kg/m   Opioid Risk Score:   Fall Risk Score:  `1  Depression screen [facility] 2/9     06/15/2022    9:42 AM 12/12/2020   10:27 AM 11/19/2017    3:26 PM  Depression screen PHQ 2/9  Decreased Interest 0 0 0  Down, Depressed, Hopeless 0 0 0  PHQ - 2 Score 0 0 0  Altered sleeping 0    Tired, decreased energy 2    Change in appetite 0    Feeling bad or failure about yourself  0    Trouble concentrating 0    Moving slowly or fidgety/restless 1    Suicidal thoughts 0    PHQ-9 Score 3    Difficult doing work/chores Very difficult         Review of Systems  Musculoskeletal:   Positive for back pain and neck pain.       B/L hand knee ankle wrist shoulder elbow pain  All other systems reviewed and are negative.      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    Can wean off Lyrica go to just 100 mg nightly x 1 week, then you can stop.    2. For Lyrica- If finds it actually was more effective than thought initially, can go back on Lyrica- 100 mg in Am and 200 mg nightly-    3. Can refill Norco-  5/325 mg- up to 2 pills/day as needed- #60- call me if needs refills- when has 5-7 pills/left.   4. Will wait for now to see if L SI joint pain improves- if it doesn't can refer to Dr Aleen Sells - can also try OMT on you tube- for L SI joint dysfunction. Can literally google it.    5. Doesn't have psoriasis, but has psoriatic arthritis.    6. Cannot use Duloxetine since caused collagenous  colitis.    7. F/U in 3 months. On L SI joint and f/u on Psoriatic arthritis   I spent a total of 24   minutes on total care today- >50% coordination of care- due to d/w pt about Dr Aleen Sells; about chronic pain and how to wean Lyrica as well as help pain with Norco.

## 2022-09-19 NOTE — Patient Instructions (Signed)
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    Can wean off Lyrica go to just 100 mg nightly x 1 week, then you can stop.    2. For Lyrica- If finds it actually was more effective than thought initially, can go back on Lyrica- 100 mg in Am and 200 mg nightly-    3. Can refill Norco-  5/325 mg- up to 2 pills/day as needed- #60- call me if needs refills- when has 5-7 pills/left.   4. Will wait for now to see if L SI joint pain improves- if it doesn't can refer to Dr Aleen Sells - can also try OMT on you tube- for L SI joint dysfunction. Can literally google it.    5. Doesn't have psoriasis, but has psoriatic arthritis.    6. Cannot use Duloxetine since caused collagenous colitis.    7. F/U in 3 months. On L SI joint and f/u on Psoriatic arthritis

## 2022-09-19 NOTE — Progress Notes (Signed)
Subjective:  Patient ID: Tonya Hall, female    DOB: 1951-03-09,  MRN: [id number]  Chief Complaint  Patient presents with   Foot Injury     Right foot possible stress fracture     72 y.o. female presents with the above complaint.  Patient presents with generalized foot pain across the ball of the foot.  Patient states been present for quite some time she is known to Dr. Donzetta Matters has been doing her routine footcare for her.  She was just wants to make sure there is nothing causing any issues.  Her primary pain ranges from submetatarsal 2 across.  She would like to discuss treatment options for this.  She does not want injection.   Review of Systems: Negative except as noted in the HPI. Denies N/V/F/Ch.  Past Medical History:  Diagnosis Date   Asthma    Albuterol in haler prn   Cataract    immature unsure which eye   Chronic back pain    COVID-19 October/November 2020   Degenerative disk disease    psoriatic   Diabetes mellitus without complication (HCC)    diet controlled   Esophageal motility disorder    Non-specific, see modified barium study/speech path, BP   Fibromyalgia    GERD (gastroesophageal reflux disease)    takes Protonix bid   Headache 2019   History of blood transfusion    post c-section   History of bronchitis    HTN (hypertension)    Hyperlipidemia    takes Pravastatin daily   Lymphocytic colitis 05/26/2010   Responded to Entocort x 3 MOS   Neuropathy    Nonallergic rhinitis    Osteoporosis    gets Boniva every 3 months   Peripheral edema    takes HCTZ daily   Pneumonia 02/2019   Psoriatic arthritis (HCC)    Dr. Anthonette Legato   Psoriatic arthritis [facility])    bilateral hands   Raynaud's disease    Seasonal allergies    Shingles 2020   SUI (stress urinary incontinence, female)    Urinary urgency     Current Outpatient Medications:    albuterol (PROVENTIL HFA;VENTOLIN HFA) 108 (90 BASE) MCG/ACT inhaler, Inhale 2 puffs into the lungs every 6  (six) hours as needed for wheezing or shortness of breath. , Disp: , Rfl:    Apremilast (OTEZLA) 30 MG TABS, Take 1 tablet (30 mg total) by mouth 2 (two) times daily., Disp: 180 tablet, Rfl: 0   aspirin 81 MG tablet, Take 81 mg by mouth daily., Disp: , Rfl:    Blood Glucose Monitoring Suppl (ONETOUCH VERIO FLEX SYSTEM) w/Device KIT, , Disp: , Rfl:    cetirizine (ZYRTEC) 10 MG tablet, Take 10 mg by mouth daily as needed (allergies)., Disp: , Rfl:    chlorhexidine (PERIDEX) 0.12 % solution, Use as directed 15 mLs in the mouth or throat at bedtime., Disp: , Rfl:    fish oil-omega-3 fatty acids 1000 MG capsule, Take 2 capsules (2 g total) by mouth daily. 2-4 GRAMS DAILY (Patient taking differently: Take 1 g by mouth 2 (two) times daily. 2-4 GRAMS DAILY), Disp: , Rfl:    gabapentin (NEURONTIN) 600 MG tablet, Take 1 tablet (600 mg total) by mouth 3 (three) times daily. To take with lyrica- lower doses on both, so has less side effects- for nerve pain, Disp: 90 tablet, Rfl: 5   Ginger, Zingiber officinalis, (GINGER ROOT) 500 MG CAPS, Take 500 mg by mouth daily., Disp: , Rfl:  Glucosamine Sulfate 1000 MG CAPS, Take 2,000 mg by mouth daily., Disp: , Rfl:    HYDROcodone-acetaminophen (NORCO/VICODIN) 5-325 MG tablet, Take 1 tablet by mouth 2 (two) times daily as needed., Disp: 60 tablet, Rfl: 0   hydrocortisone sodium succinate (SOLU-CORTEF) 100 MG SOLR injection, Inject 100 mg into the muscle daily as needed (when unable to keep down prednisone tablet). , Disp: , Rfl:    Lancets (ONETOUCH DELICA PLUS LANCET33G) MISC, USE TO TEST 3 TIMESUDAILY., Disp: , Rfl:    leflunomide (ARAVA) 20 MG tablet, TAKE 1 TABLET BY MOUTH ONCE DAILY., Disp: 90 tablet, Rfl: 0   losartan (COZAAR) 100 MG tablet, Take 100 mg by mouth daily., Disp: , Rfl:    metoprolol succinate (TOPROL-XL) 50 MG 24 hr tablet, Take 1 tablet (50 mg total) by mouth daily. Take with or immediately following a meal., Disp: , Rfl:    Misc Natural Products  (TART CHERRY ADVANCED PO), Take 1 tablet by mouth daily. daily, Disp: , Rfl:    Multiple Vitamin (MULTIVITAMIN) tablet, Take 1 tablet by mouth daily., Disp: , Rfl:    nortriptyline (PAMELOR) 25 MG capsule, TAKE (1) CAPSULE BY MOUTH AT BEDTIME., Disp: 30 capsule, Rfl: 0   ONETOUCH VERIO test strip, , Disp: , Rfl:    oxybutynin (DITROPAN-XL) 5 MG 24 hr tablet, TAKE 1 TABLET BY MOUTH AT BEDTIME., Disp: 90 tablet, Rfl: 1   OZEMPIC, 0.25 OR 0.5 MG/DOSE, 2 MG/3ML SOPN, Inject 0.5 mg into the skin every Thursday., Disp: , Rfl:    pantoprazole (PROTONIX) 40 MG tablet, Take 1 tablet (40 mg total) by mouth 2 (two) times daily before a meal. 30 minutes prior, Disp: 180 tablet, Rfl: 3   Polyethyl Glycol-Propyl Glycol (SYSTANE OP), Place 1 drop into both eyes 4 (four) times daily as needed (dry eyes)., Disp: , Rfl:    potassium chloride SA (K-DUR,KLOR-CON) 20 MEQ tablet, Take 20 mEq by mouth daily. , Disp: , Rfl:    predniSONE (DELTASONE) 5 MG tablet, TAKE 1 TABLET BY MOUTH DAILY WITH BREAKFAST, Disp: 90 tablet, Rfl: 0   pregabalin (LYRICA) 100 MG capsule, Take 1 capsule (100 mg total) by mouth 3 (three) times daily. Take WITH gabapentin- both at lower doses to reduce side effects- for nerve pain (Patient taking differently: Take 100 mg by mouth 2 (two) times daily. Take WITH gabapentin- both at lower doses to reduce side effects- for nerve pain), Disp: 90 capsule, Rfl: 5   REPATHA SURECLICK 140 MG/ML SOAJ, INJECT 1 DOSE INTO THE SKIN EVERY 14 DAYS, Disp: 2 mL, Rfl: 0   spironolactone (ALDACTONE) 25 MG tablet, Take 25 mg by mouth daily., Disp: , Rfl:    SURE COMFORT PEN NEEDLES 31G X 5 MM MISC, , Disp: , Rfl:    TALTZ 80 MG/ML SOAJ, INJECT 80MG  (1 PEN) UNDER THE SKIN EVERY 4 WEEKS, Disp: 3 mL, Rfl: 0   topiramate (TOPAMAX) 200 MG tablet, TAKE (1) TABLET BY MOUTH ONCE AT BEDTIME., Disp: 90 tablet, Rfl: 0   Turmeric 500 MG CAPS, Take 500 mg by mouth every morning., Disp: , Rfl:   Social History   Tobacco Use   Smoking Status Never   Passive exposure: Never  Smokeless Tobacco Never    Allergies  Allergen Reactions   Adalimumab Rash and Other (See Comments)    FEVER, Fast pulse Other reaction(s): Unknown   Imuran [Azathioprine Sodium] Rash    Denies Airway involvement   Methotrexate Other (See Comments)    Liver  Enzyme elevation - after 1 month of use Other reaction(s): Unknown   Plaquenil [Hydroxychloroquine Sulfate] Rash    Denies airway involvement.    Sulfonamide Derivatives Rash    Occurred with Sulfasalazine. Rash only.    Azathioprine     Other reaction(s): Unknown   Ceftin [Cefuroxime Axetil] Nausea Only    Upset stomach   Cefuroxime     Other reaction(s): GI upset   Lisinopril     Other reaction(s): Unknown   Sulfamethoxazole-Trimethoprim     Other reaction(s): Unknown   Morphine Nausea And Vomiting    IV morphine caused N and V  After surgery.   Has tolerated Kadian in the past    Objective:  There were no vitals filed for this visit. There is no height or weight on file to calculate BMI. Constitutional Well developed. Well nourished.  Vascular Dorsalis pedis pulses palpable bilaterally. Posterior tibial pulses palpable bilaterally. Capillary refill normal to all digits.  No cyanosis or clubbing noted. Pedal hair growth normal.  Neurologic Normal speech. Oriented to person, place, and time. Epicritic sensation to light touch grossly present bilaterally.  Dermatologic Nails well groomed and normal in appearance. No open wounds. No skin lesions.  Orthopedic: Generalized pain on palpation noted to right submetatarsal 2 with dominant pain on the submetatarsal 2.  Negative extensor or flexor tendinitis noted.  No pain with range of motion of first metatarsophalangeal joint noted   Radiographs: 3 views of skeletally mature the right foot: No fracture noted no bony abnormalities noted.  No arthritis noted. Assessment:   1. Metatarsalgia, right foot    Plan:   Patient was evaluated and treated and all questions answered.  Right forefoot metatarsalgia -All questions and concerns were discussed with the patient extensive detail given the amount of pain that she is having she will benefit from shoe gear modification as well as metatarsal pads.  Both of which were discussed in with the patient in the future.  No follow-ups on file.

## 2022-09-20 ENCOUNTER — Encounter: Payer: Self-pay | Admitting: Physician Assistant

## 2022-09-20 ENCOUNTER — Encounter: Payer: PPO | Attending: Endocrinology | Admitting: Physician Assistant

## 2022-09-20 VITALS — BP 126/73 | HR 80 | Ht <= 58 in | Wt 127.0 lb

## 2022-09-20 DIAGNOSIS — M533 Sacrococcygeal disorders, not elsewhere classified: Secondary | ICD-10-CM | POA: Diagnosis not present

## 2022-09-20 DIAGNOSIS — L409 Psoriasis, unspecified: Secondary | ICD-10-CM

## 2022-09-20 DIAGNOSIS — R600 Localized edema: Secondary | ICD-10-CM

## 2022-09-20 DIAGNOSIS — I7300 Raynaud's syndrome without gangrene: Secondary | ICD-10-CM

## 2022-09-20 DIAGNOSIS — M51369 Other intervertebral disc degeneration, lumbar region without mention of lumbar back pain or lower extremity pain: Secondary | ICD-10-CM

## 2022-09-20 DIAGNOSIS — Z111 Encounter for screening for respiratory tuberculosis: Secondary | ICD-10-CM | POA: Diagnosis not present

## 2022-09-20 DIAGNOSIS — Z8639 Personal history of other endocrine, nutritional and metabolic disease: Secondary | ICD-10-CM

## 2022-09-20 DIAGNOSIS — Z79899 Other long term (current) drug therapy: Secondary | ICD-10-CM | POA: Diagnosis not present

## 2022-09-20 DIAGNOSIS — M5136 Other intervertebral disc degeneration, lumbar region: Secondary | ICD-10-CM | POA: Insufficient documentation

## 2022-09-20 DIAGNOSIS — G8929 Other chronic pain: Secondary | ICD-10-CM

## 2022-09-20 DIAGNOSIS — L4050 Arthropathic psoriasis, unspecified: Secondary | ICD-10-CM

## 2022-09-20 DIAGNOSIS — M170 Bilateral primary osteoarthritis of knee: Secondary | ICD-10-CM | POA: Diagnosis not present

## 2022-09-20 DIAGNOSIS — K52831 Collagenous colitis: Secondary | ICD-10-CM

## 2022-09-20 DIAGNOSIS — Z8719 Personal history of other diseases of the digestive system: Secondary | ICD-10-CM

## 2022-09-20 DIAGNOSIS — M7061 Trochanteric bursitis, right hip: Secondary | ICD-10-CM

## 2022-09-20 DIAGNOSIS — M5030 Other cervical disc degeneration, unspecified cervical region: Secondary | ICD-10-CM

## 2022-09-20 DIAGNOSIS — E2740 Unspecified adrenocortical insufficiency: Secondary | ICD-10-CM

## 2022-09-20 DIAGNOSIS — I10 Essential (primary) hypertension: Secondary | ICD-10-CM

## 2022-09-20 DIAGNOSIS — G894 Chronic pain syndrome: Secondary | ICD-10-CM

## 2022-09-20 DIAGNOSIS — M5134 Other intervertebral disc degeneration, thoracic region: Secondary | ICD-10-CM | POA: Diagnosis not present

## 2022-09-20 DIAGNOSIS — K224 Dyskinesia of esophagus: Secondary | ICD-10-CM

## 2022-09-20 DIAGNOSIS — M810 Age-related osteoporosis without current pathological fracture: Secondary | ICD-10-CM

## 2022-09-20 DIAGNOSIS — D692 Other nonthrombocytopenic purpura: Secondary | ICD-10-CM

## 2022-09-20 DIAGNOSIS — M7062 Trochanteric bursitis, left hip: Secondary | ICD-10-CM | POA: Insufficient documentation

## 2022-09-20 LAB — COMPLETE METABOLIC PANEL WITH GFR
AG Ratio: 1.9 (calc) (ref 1.0–2.5)
ALT: 32 U/L — ABNORMAL HIGH (ref 6–29)
CO2: 21 mmol/L (ref 20–32)
Chloride: 104 mmol/L (ref 98–110)
Potassium: 4 mmol/L (ref 3.5–5.3)

## 2022-09-20 LAB — CBC WITH DIFFERENTIAL/PLATELET
Basophils Relative: 0.8 %
MCH: 31.3 pg (ref 27.0–33.0)
MCHC: 33 g/dL (ref 32.0–36.0)
MCV: 95.1 fL (ref 80.0–100.0)
RDW: 12.7 % (ref 11.0–15.0)
Total Lymphocyte: 16.4 %

## 2022-09-20 NOTE — Patient Instructions (Signed)
Standing Labs We placed an order today for your standing lab work.   Please have your standing labs drawn in September and every 3 months   Please have your labs drawn 2 weeks prior to your appointment so that the provider can discuss your lab results at your appointment, if possible.  Please note that you may see your imaging and lab results in MyChart before we have reviewed them. We will contact you once all results are reviewed. Please allow our office up to 72 hours to thoroughly review all of the results before contacting the office for clarification of your results.  WALK-IN LAB HOURS  Monday through Thursday from 8:00 am -12:30 pm and 1:00 pm-5:00 pm and Friday from 8:00 am-12:00 pm.  Patients with office visits requiring labs will be seen before walk-in labs.  You may encounter longer than normal wait times. Please allow additional time. Wait times may be shorter on  Monday and Thursday afternoons.  We do not book appointments for walk-in labs. We appreciate your patience and understanding with our staff.   Labs are drawn by [facility]. Please bring your co-pay at the time of your lab draw.  You may receive a bill from [facility] for your lab work.  Please note if you are on Hydroxychloroquine and and an order has been placed for a Hydroxychloroquine level,  you will need to have it drawn 4 hours or more after your last dose.  If you wish to have your labs drawn at another location, please call the office 24 hours in advance so we can fax the orders.  The office is located at [address], [address], [place], [place] [address]   If you have any questions regarding directions or hours of operation,  please call [phone].   As a reminder, please drink plenty of water prior to coming for your lab work. Thanks!  

## 2022-09-20 NOTE — Progress Notes (Signed)
CBC stable

## 2022-09-21 NOTE — Patient Instructions (Addendum)
20    Your procedure is scheduled on: 10/01/2022  Report to [facility] Main Entrance at     12:30 PM.  Call this number if you have problems the morning of surgery: [phone]   Remember:   Follow instructions on letter from office regarding when to stop eating and drinking        No Smoking the day of procedure  Hold Ozempic 7 days prior to procedure  No diabetic medication am of procedure      Take these medicines the morning of surgery with A SIP OF WATER: zyrtec, gabapentin, metoprolol, pantoprazole, and lyrica   Do not wear jewelry, make-up or nail polish.  Do not wear lotions, powders, or perfumes. You may wear deodorant.                Do not bring valuables to the hospital.  Contacts, dentures or bridgework may not be worn into surgery.  Leave suitcase in the car. After surgery it may be brought to your room.  For patients admitted to the hospital, checkout time is 11:00 AM the day of discharge.   Patients discharged the day of surgery will not be allowed to drive home. Upper Endoscopy, Adult Upper endoscopy is a procedure to look inside the upper GI (gastrointestinal) tract. The upper GI tract is made up of: The part of the body that moves food from your mouth to your stomach (esophagus). The stomach. The first part of your small intestine (duodenum). This procedure is also called esophagogastroduodenoscopy (EGD) or gastroscopy. In this procedure, your health care provider passes a thin, flexible tube (endoscope) through your mouth and down your esophagus into your stomach. A small camera is attached to the end of the tube. Images from the camera appear on a monitor in the exam room. During this procedure, your health care provider may also remove a small piece of tissue to be sent to a lab and examined under a microscope (biopsy). Your health care provider may do an upper endoscopy to diagnose cancers of the upper GI tract. You may also have this procedure to find the cause of  other conditions, such as: Stomach pain. Heartburn. Pain or problems when swallowing. Nausea and vomiting. Stomach bleeding. Stomach ulcers. Tell a health care provider about: Any allergies you have. All medicines you are taking, including vitamins, herbs, eye drops, creams, and over-the-counter medicines. Any problems you or family members have had with anesthetic medicines. Any blood disorders you have. Any surgeries you have had. Any medical conditions you have. Whether you are pregnant or may be pregnant. What are the risks? Generally, this is a safe procedure. However, problems may occur, including: Infection. Bleeding. Allergic reactions to medicines. A tear or hole (perforation) in the esophagus, stomach, or duodenum. What happens before the procedure? Staying hydrated Follow instructions from your health care provider about hydration, which may include: Up to 4 hours before the procedure - you may continue to drink clear liquids, such as water, clear fruit juice, black coffee, and plain tea.   Medicines Ask your health care provider about: Changing or stopping your regular medicines. This is especially important if you are taking diabetes medicines or blood thinners. Taking medicines such as aspirin and ibuprofen. These medicines can thin your blood. Do not take these medicines unless your health care provider tells you to take them. Taking over-the-counter medicines, vitamins, herbs, and supplements. General instructions Plan to have someone take you home from the hospital or clinic. If you  will be going home right after the procedure, plan to have someone with you for 24 hours. Ask your health care provider what steps will be taken to help prevent infection. What happens during the procedure?  An IV will be inserted into one of your veins. You may be given one or more of the following: A medicine to help you relax (sedative). A medicine to numb the throat (local  anesthetic). You will lie on your left side on an exam table. Your health care provider will pass the endoscope through your mouth and down your esophagus. Your health care provider will use the scope to check the inside of your esophagus, stomach, and duodenum. Biopsies may be taken. The endoscope will be removed. The procedure may vary among health care providers and hospitals. What happens after the procedure? Your blood pressure, heart rate, breathing rate, and blood oxygen level will be monitored until you leave the hospital or clinic. Do not drive for 24 hours if you were given a sedative during your procedure. When your throat is no longer numb, you may be given some fluids to drink. It is up to you to get the results of your procedure. Ask your health care provider, or the department that is doing the procedure, when your results will be ready. Summary Upper endoscopy is a procedure to look inside the upper GI tract. During the procedure, an IV will be inserted into one of your veins. You may be given a medicine to help you relax. A medicine will be used to numb your throat. The endoscope will be passed through your mouth and down your esophagus. This information is not intended to replace advice given to you by your health care provider. Make sure you discuss any questions you have with your health care provider. Document Revised: 09/11/2017 Document Reviewed: 08/19/2017 Elsevier Patient Education  2020 [employer] Inc[employer]                                                                                                                                      EndoscopyCare After  Please read the instructions outlined below and refer to this sheet in the next few weeks. These discharge instructions provide you with general information on caring for yourself after you leave the hospital. Your doctor may also give you specific instructions. While your treatment has been planned according to the  most current medical practices available, unavoidable complications occasionally occur. If you have any problems or questions after discharge, please call your doctor. HOME CARE INSTRUCTIONS Activity You may resume your regular activity but move at a slower pace for the next 24 hours.  Take frequent rest periods for the next 24 hours.  Walking will help expel (get rid of) the air and reduce the bloated feeling in your abdomen.  No driving for 24 hours (because of the anesthesia (medicine) used during the test).  You may shower.  Do not sign any important legal documents or operate any machinery for 24 hours (because of the anesthesia used during the test).  Nutrition Drink plenty of fluids.  You may resume your normal diet.  Begin with a light meal and progress to your normal diet.  Avoid alcoholic beverages for 24 hours or as instructed by your caregiver.  Medications You may resume your normal medications unless your caregiver tells you otherwise. What you can expect today You may experience abdominal discomfort such as a feeling of fullness or "gas" pains.  You may experience a sore throat for 2 to 3 days. This is normal. Gargling with salt water may help this.  Follow-up Your doctor will discuss the results of your test with you. SEEK IMMEDIATE MEDICAL CARE IF: You have excessive nausea (feeling sick to your stomach) and/or vomiting.  You have severe abdominal pain and distention (swelling).  You have trouble swallowing.  You have a temperature over 100 F (37.8 C).  You have rectal bleeding or vomiting of blood.  Document Released: 11/01/2003 Document Revised: 03/08/2011 Document Reviewed: 05/14/2007

## 2022-09-22 LAB — COMPLETE METABOLIC PANEL WITH GFR
AST: 28 U/L (ref 10–35)
Albumin: 4.2 g/dL (ref 3.6–5.1)
Alkaline phosphatase (APISO): 44 U/L (ref 37–153)
BUN/Creatinine Ratio: 36 (calc) — ABNORMAL HIGH (ref 6–22)
BUN: 32 mg/dL — ABNORMAL HIGH (ref 7–25)
Calcium: 9.6 mg/dL (ref 8.6–10.4)
Creat: 0.88 mg/dL (ref 0.60–1.00)
Globulin: 2.2 g/dL (calc) (ref 1.9–3.7)
Glucose, Bld: 119 mg/dL — ABNORMAL HIGH (ref 65–99)
Sodium: 134 mmol/L — ABNORMAL LOW (ref 135–146)
Total Bilirubin: 0.3 mg/dL (ref 0.2–1.2)
Total Protein: 6.4 g/dL (ref 6.1–8.1)
eGFR: 70 mL/min/{1.73_m2} (ref >=60)

## 2022-09-22 LAB — CBC WITH DIFFERENTIAL/PLATELET
Absolute Monocytes: 966 cells/uL — ABNORMAL HIGH (ref 200–950)
Basophils Absolute: 110 cells/uL (ref 0–200)
Eosinophils Absolute: 2318 cells/uL — ABNORMAL HIGH (ref 15–500)
Eosinophils Relative: 16.8 %
HCT: 36.7 % (ref 35.0–45.0)
Hemoglobin: 12.1 g/dL (ref 11.7–15.5)
Lymphs Abs: 2263 cells/uL (ref 850–3900)
MPV: 11.4 fL (ref 7.5–12.5)
Monocytes Relative: 7 %
Neutro Abs: 8142 cells/uL — ABNORMAL HIGH (ref 1500–7800)
Neutrophils Relative %: 59 %
Platelets: 272 10*3/uL (ref 140–400)
RBC: 3.86 10*6/uL (ref 3.80–5.10)
WBC: 13.8 10*3/uL — ABNORMAL HIGH (ref 3.8–10.8)

## 2022-09-22 LAB — QUANTIFERON-TB GOLD PLUS
Mitogen-NIL: 8.64 IU/mL
NIL: 0.02 IU/mL
QuantiFERON-TB Gold Plus: NEGATIVE
TB1-NIL: <0 IU/mL
TB2-NIL: <0 IU/mL

## 2022-09-23 NOTE — Progress Notes (Signed)
  Subjective:  Patient ID: Tonya Hall, female    DOB: Jan 17, 1951,  MRN: [id number]  Tonya Hall presents to clinic today for: preventative diabetic foot care and painful porokeratotic lesions both feet. Pain prevent(s) comfortable ambulation. Aggravating factor is weightbearing with and without shoegear.  Chief Complaint  Patient presents with   Callouses    CALLUS RIGHT FOOT BS - DIDN'T CHECK IT THIS MORNING A1C - 5.6 LVPCP - 05/2022   Foot Pain    RIGHT FOOT PAIN AT BALL OF FOOT. BEEN OFF AND ON FOR A MONTH   Patient relates pain on ball of right foot which has been occurring on and off for the past month. She denies any episode of trauma.   PCP is Carylon Perches, MD.  Allergies  Allergen Reactions   Adalimumab Rash and Other (See Comments)    FEVER, Fast pulse Other reaction(s): Unknown   Imuran [Azathioprine Sodium] Rash    Denies Airway involvement   Methotrexate Other (See Comments)    Liver Enzyme elevation - after 1 month of use Other reaction(s): Unknown   Plaquenil [Hydroxychloroquine Sulfate] Rash    Denies airway involvement.    Sulfonamide Derivatives Rash    Occurred with Sulfasalazine. Rash only.    Azathioprine     Other reaction(s): Unknown   Ceftin [Cefuroxime Axetil] Nausea Only    Upset stomach   Cefuroxime     Other reaction(s): GI upset   Lisinopril     Other reaction(s): Unknown   Sulfamethoxazole-Trimethoprim     Other reaction(s): Unknown   Morphine Nausea And Vomiting    IV morphine caused N and V  After surgery.   Has tolerated Kadian in the past     Review of Systems: Negative except as noted in the HPI.  Objective: No changes noted in today's physical examination. There were no vitals filed for this visit.  Tonya Hall is a pleasant 72 y.o. female in NAD. AAO x 3.  Vascular Examination: Capillary refill time <3 seconds b/l LE. Palpable pedal pulses b/l LE. Digital hair present b/l. No pedal edema b/l. Skin temperature gradient  WNL b/l. No varicosities b/l.  Purple discoloration b/l consistent with Raynaud's b/l. No ischemia or gangrene noted b/l LE. No cyanosis or clubbing noted b/l LE.  Dermatological Examination: Pedal skin with normal turgor, texture and tone b/l. No open wounds. No interdigital macerations b/l. Toenail(s) 1-5 bilaterally mycotic with adequate length. Porokeratotic lesion(s) submet head 2 right foot, submet head 3 b/l, and submet head 4 right foot. No erythema, no edema, no drainage, no fluctuance.  Neurological Examination: Protective sensation intact with 10 gram monofilament b/l LE. Vibratory sensation intact b/l LE.   Musculoskeletal Examination: Normal muscle strength 5/5 to all lower extremity muscle groups bilaterally. HAV with bunion bilaterally and hammertoes 2-5 b/l.Marland Kitchen No pain, crepitus or joint limitation noted with ROM b/l LE.  Patient ambulates independently without assistive aids.  Assessment/Plan: 1. Porokeratosis   2. Diabetes mellitus without complication (HCC)    -Consent given for treatment as described below: -Examined patient. -For right foot pain, patient referred to Dr. Allena Katz for further work up. -Porokeratotic lesion(s) submet head 2 right foot, submet head 3 b/l, and submet head 4 right foot pared and enucleated with sterile currette without incident. Total number of lesions debrided=4. -Patient/POA to call should there be question/concern in the interim.   Return in about 3 months (around 12/19/2022).  Tonya Hall, DPM

## 2022-09-24 ENCOUNTER — Telehealth: Payer: Self-pay | Admitting: *Deleted

## 2022-09-24 NOTE — Progress Notes (Signed)
TB gold negative. ALT is borderline elevated-32. Rest of CMP stable. Avoid tylenol and alcohol use.  We will continue to monitor.

## 2022-09-24 NOTE — Telephone Encounter (Signed)
Called to reschedule pt for 10/01/22 due to provider being out. Pt states she can't do any other time because her husband will be having back surgery. She says she can do August. Advise pt that we will contact her once we get providers August schedule.  EGD +/-ED w/Dr.Carver.

## 2022-09-25 ENCOUNTER — Encounter (HOSPITAL_COMMUNITY): Payer: Self-pay

## 2022-09-25 ENCOUNTER — Encounter (HOSPITAL_COMMUNITY)
Admission: RE | Admit: 2022-09-25 | Discharge: 2022-09-25 | Disposition: A | Discharge status: Home / Self Care | Payer: PPO | Source: Ambulatory Visit | Attending: Internal Medicine | Admitting: Internal Medicine

## 2022-09-25 DIAGNOSIS — I10 Essential (primary) hypertension: Secondary | ICD-10-CM

## 2022-10-01 ENCOUNTER — Ambulatory Visit (HOSPITAL_COMMUNITY): Admission: RE | Admit: 2022-10-01 | Payer: PPO | Source: Home / Self Care

## 2022-10-01 ENCOUNTER — Encounter (HOSPITAL_COMMUNITY): Admission: RE | Payer: Self-pay | Source: Home / Self Care

## 2022-10-01 SURGERY — ESOPHAGOGASTRODUODENOSCOPY (EGD) WITH PROPOFOL
Anesthesia: Monitor Anesthesia Care

## 2022-10-05 ENCOUNTER — Other Ambulatory Visit: Payer: Self-pay | Admitting: Physician Assistant

## 2022-10-05 NOTE — Telephone Encounter (Signed)
Last Fill: 04/05/2022  Next Visit: 12/20/2022  Last Visit: 09/20/2022  Dx:  Psoriatic arthritis   Current Dose per office note on 09/20/2022: prednisone 5 mg 1 tablet daily   Okay to refill Prednisone?

## 2022-10-15 ENCOUNTER — Other Ambulatory Visit: Payer: Self-pay | Admitting: Internal Medicine

## 2022-10-16 ENCOUNTER — Other Ambulatory Visit: Payer: Self-pay

## 2022-10-16 DIAGNOSIS — M546 Pain in thoracic spine: Secondary | ICD-10-CM | POA: Diagnosis not present

## 2022-10-16 DIAGNOSIS — M4712 Other spondylosis with myelopathy, cervical region: Secondary | ICD-10-CM | POA: Diagnosis not present

## 2022-10-16 DIAGNOSIS — Z981 Arthrodesis status: Secondary | ICD-10-CM | POA: Diagnosis not present

## 2022-10-16 DIAGNOSIS — M48061 Spinal stenosis, lumbar region without neurogenic claudication: Secondary | ICD-10-CM | POA: Diagnosis not present

## 2022-10-16 DIAGNOSIS — M4722 Other spondylosis with radiculopathy, cervical region: Secondary | ICD-10-CM | POA: Diagnosis not present

## 2022-10-18 ENCOUNTER — Telehealth: Payer: Self-pay | Admitting: Internal Medicine

## 2022-10-18 NOTE — Telephone Encounter (Signed)
Spoke to patient, will fill out the application. Will drop it at [facility] location on Monday July,22.

## 2022-10-18 NOTE — Telephone Encounter (Signed)
Patient needs patient assistance for Repatha.  Please verify if website is [employer] and if she needs to go on line to do the paperwork.  Or if this is done in house.  She did this last year and states she was able to afford but is in the donut hole

## 2022-10-18 NOTE — Telephone Encounter (Signed)
Pt c/o medication issue:  1. Name of Medication:   Evolocumab (REPATHA SURECLICK) 140 MG/ML SOAJ    2. How are you currently taking this medication (dosage and times per day)?   3. Are you having a reaction (difficulty breathing--STAT)?   4. What is your medication issue? Patient is requesting call back after going into donut hole with her insurance. She states that she would like to see about getting financial assistance. Please advise.

## 2022-10-19 ENCOUNTER — Encounter: Payer: Self-pay | Admitting: *Deleted

## 2022-10-22 ENCOUNTER — Encounter: Payer: Self-pay | Admitting: *Deleted

## 2022-10-26 ENCOUNTER — Other Ambulatory Visit: Payer: Self-pay | Admitting: Physician Assistant

## 2022-10-26 NOTE — Telephone Encounter (Signed)
Last Fill: 07/12/2022  Labs: 09/20/2022 CBC stable. ALT is borderline elevated-32. Rest of CMP stable.   Next Visit: 12/20/2022  Last Visit: 09/20/2022  DX: Psoriatic arthritis   Current Dose per office note 09/20/2022: Arava 20mg  1 tablet daily   Okay to refill Arava ?

## 2022-10-31 ENCOUNTER — Encounter: Payer: Self-pay | Admitting: Internal Medicine

## 2022-10-31 ENCOUNTER — Ambulatory Visit: Payer: PPO | Attending: Internal Medicine | Admitting: Internal Medicine

## 2022-10-31 VITALS — Ht <= 58 in | Wt 121.0 lb

## 2022-10-31 DIAGNOSIS — L4050 Arthropathic psoriasis, unspecified: Secondary | ICD-10-CM | POA: Diagnosis not present

## 2022-10-31 DIAGNOSIS — R609 Edema, unspecified: Secondary | ICD-10-CM

## 2022-10-31 DIAGNOSIS — R931 Abnormal findings on diagnostic imaging of heart and coronary circulation: Secondary | ICD-10-CM

## 2022-10-31 DIAGNOSIS — E7849 Other hyperlipidemia: Secondary | ICD-10-CM

## 2022-10-31 DIAGNOSIS — I10 Essential (primary) hypertension: Secondary | ICD-10-CM | POA: Diagnosis not present

## 2022-10-31 DIAGNOSIS — E785 Hyperlipidemia, unspecified: Secondary | ICD-10-CM | POA: Diagnosis not present

## 2022-10-31 DIAGNOSIS — E1159 Type 2 diabetes mellitus with other circulatory complications: Secondary | ICD-10-CM | POA: Diagnosis not present

## 2022-10-31 DIAGNOSIS — I700 Atherosclerosis of aorta: Secondary | ICD-10-CM

## 2022-10-31 DIAGNOSIS — M791 Myalgia, unspecified site: Secondary | ICD-10-CM

## 2022-10-31 DIAGNOSIS — Z7985 Long-term (current) use of injectable non-insulin antidiabetic drugs: Secondary | ICD-10-CM | POA: Diagnosis not present

## 2022-10-31 NOTE — Patient Instructions (Signed)
Medication Instructions:  RESUME Repatha  *If you need a refill on your cardiac medications before your next appointment, please call your pharmacy*   Lab Work: FASTING NMR lipoprofile in 2-3 months  If you have labs (blood work) drawn today and your tests are completely normal, you will receive your results only by: MyChart Message (if you have MyChart) OR A paper copy in the mail If you have any lab test that is abnormal or we need to change your treatment, we will call you to review the results.   Testing/Procedures: NONE   Follow-Up: At [facility], you and your health needs are our priority.  As part of our continuing mission to provide you with exceptional heart care, we have created designated Provider Care Teams.  These Care Teams include your primary Cardiologist (physician) and Advanced Practice Providers (APPs -  Physician Assistants and Nurse Practitioners) who all work together to provide you with the care you need, when you need it.  We recommend signing up for the patient portal called "MyChart".  Sign up information is provided on this After Visit Summary.  MyChart is used to connect with patients for Virtual Visits (Telemedicine).  Patients are able to view lab/test results, encounter notes, upcoming appointments, etc.  Non-urgent messages can be sent to your provider as well.   To learn more about what you can do with MyChart, go to [email or web address].    Your next appointment:    1 year with Dr. Rennis Golden

## 2022-10-31 NOTE — Progress Notes (Signed)
Virtual Visit via Video Note   Because of Tonya Hall's co-morbid illnesses, she is at least at moderate risk for complications without adequate follow up.  This format is felt to be most appropriate for this patient at this time.  All issues noted in this document were discussed and addressed.  A limited physical exam was performed with this format.  Please refer to the patient's chart for her consent to telehealth for [facility].      Date:  10/31/2022   ID:  Tonya Hall, DOB 1950/11/28, MRN [id number] The patient was identified using 2 identifiers.  Evaluation Performed:  Follow-Up Visit  Patient Location:  [address] [place] [address]  Provider location:   [address] [place], [place] [address]  PCP:  Carylon Perches, MD  Cardiologist:  None Electrophysiologist:  None   Chief Complaint:  Follow-up dyslipidemia  History of Present Illness:    Tonya Hall is a 72 y.o. female who presents via audio/video conferencing for a telehealth visit today.  Ms. Brightwell returns today for follow-up of dyslipidemia.  She reports she has been out of her Repatha for about 2 weeks.  The cost has become very high.  She was previously getting the medicine for free from [employer] but had to reapply for that.  We will try to assist her with that process.  She did have repeat labs in May.  There was a slight increase in her cholesterol.  Her LDL went from 78-1 13 with an increase in LDL particle number up to 6213 (from 840).  This may also be in part due to an improvement in her triglycerides from 375 down to 195 as I had advised her to increase intake of fish oil which she is currently taking 1 g twice daily.  Prior CV studies:   The following studies were reviewed today:  Deferred  PMHx:  Past Medical History:  Diagnosis Date   Asthma    Albuterol in haler prn   Cataract    immature unsure which eye   Chronic back pain    COVID-19 October/November  2020   Degenerative disk disease    psoriatic   Diabetes mellitus without complication (HCC)    diet controlled   Esophageal motility disorder    Non-specific, see modified barium study/speech path, BP   Fibromyalgia    GERD (gastroesophageal reflux disease)    takes Protonix bid   Headache 2019   History of blood transfusion    post c-section   History of bronchitis    HTN (hypertension)    Hyperlipidemia    takes Pravastatin daily   Lymphocytic colitis 05/26/2010   Responded to Entocort x 3 MOS   Neuropathy    Nonallergic rhinitis    Osteoporosis    gets Boniva every 3 months   Peripheral edema    takes HCTZ daily   Pneumonia 02/2019   Psoriatic arthritis (HCC)    Dr. Anthonette Legato   Psoriatic arthritis [facility])    bilateral hands   Raynaud's disease    Seasonal allergies    Shingles 2020   SUI (stress urinary incontinence, female)    Urinary urgency     Past Surgical History:  Procedure Laterality Date   ANTERIOR CERVICAL DECOMP/DISCECTOMY FUSION N/A 03/09/2020   Procedure: ANTERIOR CERVICAL DECOMPRESSION/DISCECTOMY FUSION, INTERBODY PROSTHESIS, PLATE SCREWS CERVICAL FIVE-SIX, CERVICAL SIX-SEVEN;  Surgeon: Tressie Stalker, MD;  Location: [facility] OR;  Service: Neurosurgery;  Laterality: N/A;   BACK  SURGERY  2003   BIOPSY  04/17/2016   Procedure: BIOPSY;  Surgeon: West Bali, MD;  Location[facility];  Service: Endoscopy;;  random colon bx's   CERVICAL SPINE SURGERY  09/09/2017   CESAREAN SECTION  1974, 1978       COLONOSCOPY  06/19/2008   [id number]:[id number] internal hemorrhoids/mild sigmoin colon diverticulosis   COLONOSCOPY  2012   Dr. Jena Gauss: normal rectum, diverticula, lymphocytic colitis    COLONOSCOPY WITH PROPOFOL N/A 04/17/2016   Dr. Darrick Penna: Normal terminal ileum, internal/external hemorrhoids, random colon biopsies consistent with collagenous colitis   DILATION AND CURETTAGE OF UTERUS  1977   spontaneous abortion   ESOPHAGOGASTRODUODENOSCOPY  06/19/2008    [id number] gastritis   LUMBAR DISC ARTHROPLASTY  09/1996   LUMBAR FUSION  6/03, 11/08, 11/14   L4-5 fusion, L2-3 fusion, L1-2   LUMBAR FUSION  08/03/2020   L4-L5   LUMBAR LAMINECTOMY  03/2020   MOUTH SURGERY  12/07/2021   oral surgery   ODONTOID SCREW INSERTION N/A 01/27/2018   Procedure: ODONTOID SCREW INSERTION;  Surgeon: Tressie Stalker, MD;  Location: [facility] OR;  Service: Neurosurgery;  Laterality: N/A;  ODONTOID SCREW INSERTION   TONSILLECTOMY     TONSILLECTOMY AND ADENOIDECTOMY     TUBAL LIGATION      FAMHx:  Family History  Problem Relation Age of Onset   Diabetes Mother    Pancreatitis Mother    Arthritis Mother        psoriatic    Fibromyalgia Mother    Rheumatic fever Father    Diabetes Other        grandparents   Skin cancer Other        grandfather   Hypertension Other        grandparent   Congestive Heart Failure Other        grandfather   Parkinson's disease Other        grandmother   Colon cancer Maternal Uncle    Fibromyalgia Sister    Rheum arthritis Sister    Diabetes Sister    Fibromyalgia Sister    Diabetes Sister    Rheum arthritis Sister    Hypertension Son    Colon polyps Neg Hx     SOCHx:   reports that she has never smoked. She has never been exposed to tobacco smoke. She has never used smokeless tobacco. She reports that she does not drink alcohol and does not use drugs.  ALLERGIES:  Allergies  Allergen Reactions   Adalimumab Rash and Other (See Comments)    FEVER, Fast pulse Other reaction(s): Unknown   Imuran [Azathioprine Sodium] Rash    Denies Airway involvement   Methotrexate Other (See Comments)    Liver Enzyme elevation - after 1 month of use Other reaction(s): Unknown   Plaquenil [Hydroxychloroquine Sulfate] Rash    Denies airway involvement.    Sulfonamide Derivatives Rash    Occurred with Sulfasalazine. Rash only.    Azathioprine     Other reaction(s): Unknown   Ceftin [Cefuroxime Axetil] Nausea Only    Upset  stomach   Cefuroxime     Other reaction(s): GI upset   Lisinopril     Other reaction(s): Unknown   Sulfamethoxazole-Trimethoprim     Other reaction(s): Unknown   Morphine Nausea And Vomiting    IV morphine caused N and V  After surgery.   Has tolerated Kadian in the past     MEDS:  Current Meds  Medication Sig   albuterol (PROVENTIL HFA;VENTOLIN  HFA) 108 (90 BASE) MCG/ACT inhaler Inhale 2 puffs into the lungs every 6 (six) hours as needed for wheezing or shortness of breath.    Apremilast (OTEZLA) 30 MG TABS Take 1 tablet (30 mg total) by mouth 2 (two) times daily.   aspirin 81 MG tablet Take 81 mg by mouth daily.   Blood Glucose Monitoring Suppl (ONETOUCH VERIO FLEX SYSTEM) w/Device KIT    cetirizine (ZYRTEC) 10 MG tablet Take 10 mg by mouth daily as needed (allergies).   chlorhexidine (PERIDEX) 0.12 % solution Use as directed 15 mLs in the mouth or throat at bedtime.   Evolocumab (REPATHA SURECLICK) 140 MG/ML SOAJ INJECT 1 DOSE INTO THE SKIN EVERY 14 DAYS   fish oil-omega-3 fatty acids 1000 MG capsule Take 2 capsules (2 g total) by mouth daily. 2-4 GRAMS DAILY (Patient taking differently: Take 1 g by mouth 2 (two) times daily. 2-4 GRAMS DAILY)   gabapentin (NEURONTIN) 600 MG tablet Take 1 tablet (600 mg total) by mouth 3 (three) times daily. To take with lyrica- lower doses on both, so has less side effects- for nerve pain   Ginger, Zingiber officinalis, (GINGER ROOT) 500 MG CAPS Take 500 mg by mouth daily.   Glucosamine Sulfate 1000 MG CAPS Take 2,000 mg by mouth daily.   HYDROcodone-acetaminophen (NORCO/VICODIN) 5-325 MG tablet Take 1 tablet by mouth 2 (two) times daily as needed.   hydrocortisone sodium succinate (SOLU-CORTEF) 100 MG SOLR injection Inject 100 mg into the muscle daily as needed (when unable to keep down prednisone tablet).    Lancets (ONETOUCH DELICA PLUS LANCET33G) MISC USE TO TEST 3 TIMESUDAILY.   leflunomide (ARAVA) 20 MG tablet TAKE 1 TABLET BY MOUTH ONCE DAILY.    losartan (COZAAR) 100 MG tablet Take 100 mg by mouth daily.   metoprolol succinate (TOPROL-XL) 50 MG 24 hr tablet Take 1 tablet (50 mg total) by mouth daily. Take with or immediately following a meal.   Misc Natural Products (TART CHERRY ADVANCED PO) Take 1 tablet by mouth daily. daily   Multiple Vitamin (MULTIVITAMIN) tablet Take 1 tablet by mouth daily.   nortriptyline (PAMELOR) 25 MG capsule TAKE (1) CAPSULE BY MOUTH AT BEDTIME.   ONETOUCH VERIO test strip    oxybutynin (DITROPAN-XL) 5 MG 24 hr tablet TAKE 1 TABLET BY MOUTH AT BEDTIME.   OZEMPIC, 0.25 OR 0.5 MG/DOSE, 2 MG/3ML SOPN Inject 0.5 mg into the skin every Thursday.   pantoprazole (PROTONIX) 40 MG tablet Take 1 tablet (40 mg total) by mouth 2 (two) times daily before a meal. 30 minutes prior   Polyethyl Glycol-Propyl Glycol (SYSTANE OP) Place 1 drop into both eyes 4 (four) times daily as needed (dry eyes).   potassium chloride SA (K-DUR,KLOR-CON) 20 MEQ tablet Take 20 mEq by mouth daily.    predniSONE (DELTASONE) 5 MG tablet TAKE 1 TABLET BY MOUTH DAILY WITH BREAKFAST   pregabalin (LYRICA) 100 MG capsule Take 1 capsule (100 mg total) by mouth 3 (three) times daily. Take WITH gabapentin- both at lower doses to reduce side effects- for nerve pain (Patient taking differently: Take 100 mg by mouth 2 (two) times daily. Take WITH gabapentin- both at lower doses to reduce side effects- for nerve pain)   spironolactone (ALDACTONE) 25 MG tablet Take 25 mg by mouth daily.   SURE COMFORT PEN NEEDLES 31G X 5 MM MISC    TALTZ 80 MG/ML SOAJ INJECT 80MG  (1 PEN) UNDER THE SKIN EVERY 4 WEEKS   topiramate (TOPAMAX) 200 MG tablet  TAKE (1) TABLET BY MOUTH ONCE AT BEDTIME.   Turmeric 500 MG CAPS Take 500 mg by mouth every morning.     ROS: Pertinent items noted in HPI and remainder of comprehensive ROS otherwise negative.  Labs/Other Tests and Data Reviewed:    Recent Labs: 09/20/2022: ALT 32; BUN 32; Creat 0.88; Hemoglobin 12.1; Platelets 272;  Potassium 4.0; Sodium 134   Recent Lipid Panel Lab Results  Component Value Date/Time   TRIG 289 (H) 02/06/2019 05:12 PM    Wt Readings from Last 3 Encounters:  10/31/22 121 lb (54.9 kg)  09/20/22 127 lb (57.6 kg)  09/19/22 128 lb (58.1 kg)     Exam:    Vital Signs:  Ht 4\' 10"  (1.473 m)   Wt 121 lb (54.9 kg)   LMP 02/01/1999   BMI 25.29 kg/m    General appearance: alert and no distress Lungs: No visual respiratory difficulty Abdomen: Normal weight Extremities: No edema Neurologic: Grossly normal  ASSESSMENT & PLAN:    Periorbital and pedal edema -chronic Elevated CAC score of 140, 75th percentile - aortic atherosclerosis (05/2021) Hypertension Dyslipidemia, LDL >190 -probable familial hyperlipidemia (Simon Broome criteria) Psoriatic arthritis DM2  Ms. Bonton has run out of her Repatha.  She was getting it free previously from [employer].  She will need some assistance and reapplying for grant if possible.  Because she is now off the medicine she will need at least 2 doses and 2 additional weeks before reassessing her cholesterol.  I think that her LDL is gone up somewhat with improvement in triglycerides both due to a calculated number and the fact that she is increased her saturated fat intake with more fish oil.  Will plan repeat lipids in about 2 to 3 months after she is established therapy.  Follow-up with me annually or sooner as necessary.  Patient Risk:   After full review of this patients clinical status, I feel that they are at least moderate risk at this time.  Time:   Today, I have spent 15 minutes with the patient with telehealth technology discussing dyslipidemia.     Medication Adjustments/Labs and Tests Ordered: Current medicines are reviewed at length with the patient today.  Concerns regarding medicines are outlined above.   Tests Ordered: No orders of the defined types were placed in this encounter.   Medication Changes: No orders of the defined types  were placed in this encounter.   Disposition:  in 1 year(s)  Chrystie Nose, MD, [facility], FACP  [place]  [facility]  Medical Director of the [facility] Lipid Disorders &  Cardiovascular Risk Reduction Clinic Diplomate of the American Board of Clinical Lipidology Attending Cardiologist  Direct Dial: [phone]  Fax: [phone]  Website:  [email or web address]  Chrystie Nose, MD  10/31/2022 8:30 AM

## 2022-11-06 NOTE — Patient Instructions (Signed)
Tonya Hall  11/06/2022     @PREFPERIOPPHARMACY @   Your procedure is scheduled on  11/12/2022.   Report to [facility] at  [phone]  A.M.   Call this number if you have problems the morning of surgery:  [phone]  If you experience any cold or flu symptoms such as cough, fever, chills, shortness of breath, etc. between now and your scheduled surgery, please notify us at the above number.   Remember:  Follow the diet and prep instructions given to you by the office.       Use your inhaler before you come and bring your rescue inhaler with you.     Take these medicines the morning of surgery with A SIP OF WATER         zyrtec, gabapentin, hydrocodone(if needed), arava, metoprolol, pantoprazole, prednisone, lyrica.     Do not wear jewelry, make-up or nail polish, including gel polish,  artificial nails, or any other type of covering on natural nails (fingers and  toes).  Do not wear lotions, powders, or perfumes, or deodorant.  Do not shave 48 hours prior to surgery.  Men may shave face and neck.  Do not bring valuables to the hospital.  [facility] is not responsible for any belongings or valuables.  Contacts, dentures or bridgework may not be worn into surgery.  Leave your suitcase in the car.  After surgery it may be brought to your room.  For patients admitted to the hospital, discharge time will be determined by your treatment team.  Patients discharged the day of surgery will not be allowed to drive home and must have someone with them for 24 hours.     Special instructions:   DO NOT smoke tobacco or vape for 24 hors before your procedure.  Please read over the following fact sheets that you were given. Anesthesia Post-op Instructions and Care and Recovery After Surgery      Upper Endoscopy, Adult, Care After After the procedure, it is common to have a sore throat. It is also common to have: Mild stomach pain or  discomfort. Bloating. Nausea. Follow these instructions at home: The instructions below may help you care for yourself at home. Your health care provider may give you more instructions. If you have questions, ask your health care provider. If you were given a sedative during the procedure, it can affect you for several hours. Do not drive or operate machinery until your health care provider says that it is safe. If you will be going home right after the procedure, plan to have a responsible adult: Take you home from the hospital or clinic. You will not be allowed to drive. Care for you for the time you are told. Follow instructions from your health care provider about what you may eat and drink. Return to your normal activities as told by your health care provider. Ask your health care provider what activities are safe for you. Take over-the-counter and prescription medicines only as told by your health care provider. Contact a health care provider if you: Have a sore throat that lasts longer than one day. Have trouble swallowing. Have a fever. Get help right away if you: Vomit blood or your vomit looks like coffee grounds. Have bloody, black, or tarry stools. Have a very bad sore throat or you cannot swallow. Have difficulty breathing or very bad pain in your chest or abdomen. These symptoms may be an emergency. Get help  right away. Call 911. Do not wait to see if the symptoms will go away. Do not drive yourself to the hospital. Summary After the procedure, it is common to have a sore throat, mild stomach discomfort, bloating, and nausea. If you were given a sedative during the procedure, it can affect you for several hours. Do not drive until your health care provider says that it is safe. Follow instructions from your health care provider about what you may eat and drink. Return to your normal activities as told by your health care provider. This information is not intended to replace  advice given to you by your health care provider. Make sure you discuss any questions you have with your health care provider. Document Revised: 06/28/2021 Document Reviewed: 06/28/2021 Elsevier Patient Education  2024 [employer] Esophageal Dilatation Esophageal dilatation, also called esophageal dilation, is a procedure to widen or open a blocked or narrowed part of the esophagus. The esophagus is the part of the body that moves food and liquid from the mouth to the stomach. You may need this procedure if: You have a buildup of scar tissue in your esophagus that makes it difficult, painful, or impossible to swallow. This can be caused by gastroesophageal reflux disease (GERD). You have cancer of the esophagus. There is a problem with how food moves through your esophagus. In some cases, you may need this procedure repeated at a later time to dilate the esophagus gradually. Tell a health care provider about: Any allergies you have. All medicines you are taking, including vitamins, herbs, eye drops, creams, and over-the-counter medicines. Any problems you or family members have had with anesthetic medicines. Any blood disorders you have. Any surgeries you have had. Any medical conditions you have. Any antibiotic medicines you are required to take before dental procedures. Whether you are pregnant or may be pregnant. What are the risks? Generally, this is a safe procedure. However, problems may occur, including: Bleeding due to a tear in the lining of the esophagus. A hole, or perforation, in the esophagus. What happens before the procedure? Ask your health care provider about: Changing or stopping your regular medicines. This is especially important if you are taking diabetes medicines or blood thinners. Taking medicines such as aspirin and ibuprofen. These medicines can thin your blood. Do not take these medicines unless your health care provider tells you to take them. Taking  over-the-counter medicines, vitamins, herbs, and supplements. Follow instructions from your health care provider about eating or drinking restrictions. Plan to have a responsible adult take you home from the hospital or clinic. Plan to have a responsible adult care for you for the time you are told after you leave the hospital or clinic. This is important. What happens during the procedure? You may be given a medicine to help you relax (sedative). A numbing medicine may be sprayed into the back of your throat, or you may gargle the medicine. Your health care provider may perform the dilatation using various surgical instruments, such as: Simple dilators. This instrument is carefully placed in the esophagus to stretch it. Guided wire bougies. This involves using an endoscope to insert a wire into the esophagus. A dilator is passed over this wire to enlarge the esophagus. Then the wire is removed. Balloon dilators. An endoscope with a small balloon is inserted into the esophagus. The balloon is inflated to stretch the esophagus and open it up. The procedure may vary among health care providers and hospitals. What can I expect after  the procedure? Your blood pressure, heart rate, breathing rate, and blood oxygen level will be monitored until you leave the hospital or clinic. Your throat may feel slightly sore and numb. This will get better over time. You will not be allowed to eat or drink until your throat is no longer numb. When you are able to drink, urinate, and sit on the edge of the bed without nausea or dizziness, you may be able to return home. Follow these instructions at home: Take over-the-counter and prescription medicines only as told by your health care provider. If you were given a sedative during the procedure, it can affect you for several hours. Do not drive or operate machinery until your health care provider says that it is safe. Plan to have a responsible adult care for you for  the time you are told. This is important. Follow instructions from your health care provider about any eating or drinking restrictions. Do not use any products that contain nicotine or tobacco, such as cigarettes, e-cigarettes, and chewing tobacco. If you need help quitting, ask your health care provider. Keep all follow-up visits. This is important. Contact a health care provider if: You have a fever. You have pain that is not relieved by medicine. Get help right away if: You have chest pain. You have trouble breathing. You have trouble swallowing. You vomit blood. You have black, tarry, or bloody stools. These symptoms may represent a serious problem that is an emergency. Do not wait to see if the symptoms will go away. Get medical help right away. Call your local emergency services (911 in the [place] Do not drive yourself to the hospital. Summary Esophageal dilatation, also called esophageal dilation, is a procedure to widen or open a blocked or narrowed part of the esophagus. Plan to have a responsible adult take you home from the hospital or clinic. For this procedure, a numbing medicine may be sprayed into the back of your throat, or you may gargle the medicine. Do not drive or operate machinery until your health care provider says that it is safe. This information is not intended to replace advice given to you by your health care provider. Make sure you discuss any questions you have with your health care provider. Document Revised: 08/05/2019 Document Reviewed: 08/05/2019 Elsevier Patient Education  2024 [employer] Monitored Anesthesia Care, Care After The following information offers guidance on how to care for yourself after your procedure. Your health care provider may also give you more specific instructions. If you have problems or questions, contact your health care provider. What can I expect after the procedure? After the procedure, it is common to  have: Tiredness. Little or no memory about what happened during or after the procedure. Impaired judgment when it comes to making decisions. Nausea or vomiting. Some trouble with balance. Follow these instructions at home: For the time period you were told by your health care provider:  Rest. Do not participate in activities where you could fall or become injured. Do not drive or use machinery. Do not drink alcohol. Do not take sleeping pills or medicines that cause drowsiness. Do not make important decisions or sign legal documents. Do not take care of children on your own. Medicines Take over-the-counter and prescription medicines only as told by your health care provider. If you were prescribed antibiotics, take them as told by your health care provider. Do not stop using the antibiotic even if you start to feel better. Eating and drinking Follow instructions from  your health care provider about what you may eat and drink. Drink enough fluid to keep your urine pale yellow. If you vomit: Drink clear fluids slowly and in small amounts as you are able. Clear fluids include water, ice chips, low-calorie sports drinks, and fruit juice that has water added to it (diluted fruit juice). Eat light and bland foods in small amounts as you are able. These foods include bananas, applesauce, rice, lean meats, toast, and crackers. General instructions  Have a responsible adult stay with you for the time you are told. It is important to have someone help care for you until you are awake and alert. If you have sleep apnea, surgery and some medicines can increase your risk for breathing problems. Follow instructions from your health care provider about wearing your sleep device: When you are sleeping. This includes during daytime naps. While taking prescription pain medicines, sleeping medicines, or medicines that make you drowsy. Do not use any products that contain nicotine or tobacco. These  products include cigarettes, chewing tobacco, and vaping devices, such as e-cigarettes. If you need help quitting, ask your health care provider. Contact a health care provider if: You feel nauseous or vomit every time you eat or drink. You feel light-headed. You are still sleepy or having trouble with balance after 24 hours. You get a rash. You have a fever. You have redness or swelling around the IV site. Get help right away if: You have trouble breathing. You have new confusion after you get home. These symptoms may be an emergency. Get help right away. Call 911. Do not wait to see if the symptoms will go away. Do not drive yourself to the hospital. This information is not intended to replace advice given to you by your health care provider. Make sure you discuss any questions you have with your health care provider. Document Revised: 08/14/2021 Document Reviewed: 08/14/2021 Elsevier Patient Education  2024 [employer].

## 2022-11-07 ENCOUNTER — Encounter (HOSPITAL_COMMUNITY)
Admission: RE | Admit: 2022-11-07 | Discharge: 2022-11-07 | Disposition: A | Discharge status: Home / Self Care | Payer: PPO | Source: Ambulatory Visit | Attending: Internal Medicine | Admitting: Internal Medicine

## 2022-11-07 ENCOUNTER — Encounter (HOSPITAL_COMMUNITY): Payer: Self-pay

## 2022-11-07 VITALS — BP 160/76 | HR 98 | Temp 98.5°F | Resp 18 | Ht <= 58 in | Wt 121.0 lb

## 2022-11-07 DIAGNOSIS — Z01818 Encounter for other preprocedural examination: Secondary | ICD-10-CM | POA: Diagnosis present

## 2022-11-07 DIAGNOSIS — I10 Essential (primary) hypertension: Secondary | ICD-10-CM | POA: Diagnosis not present

## 2022-11-07 DIAGNOSIS — Z01810 Encounter for preprocedural cardiovascular examination: Secondary | ICD-10-CM | POA: Insufficient documentation

## 2022-11-07 DIAGNOSIS — Z01812 Encounter for preprocedural laboratory examination: Secondary | ICD-10-CM | POA: Insufficient documentation

## 2022-11-07 DIAGNOSIS — R000 Tachycardia, unspecified: Secondary | ICD-10-CM | POA: Diagnosis not present

## 2022-11-07 DIAGNOSIS — E119 Type 2 diabetes mellitus without complications: Secondary | ICD-10-CM | POA: Insufficient documentation

## 2022-11-07 LAB — BASIC METABOLIC PANEL
Anion gap: 8 (ref 5–15)
BUN: 28 mg/dL — ABNORMAL HIGH (ref 8–23)
CO2: 19 mmol/L — ABNORMAL LOW (ref 22–32)
Calcium: 9.1 mg/dL (ref 8.9–10.3)
Chloride: 108 mmol/L (ref 98–111)
Creatinine, Ser: 0.89 mg/dL (ref 0.44–1.00)
GFR, Estimated: >60 mL/min (ref >60)
Glucose, Bld: 160 mg/dL — ABNORMAL HIGH (ref 70–99)
Potassium: 3.2 mmol/L — ABNORMAL LOW (ref 3.5–5.1)
Sodium: 135 mmol/L (ref 135–145)

## 2022-11-08 ENCOUNTER — Encounter: Payer: Self-pay | Admitting: Internal Medicine

## 2022-11-09 ENCOUNTER — Other Ambulatory Visit: Payer: Self-pay | Admitting: Internal Medicine

## 2022-11-09 MED ORDER — POTASSIUM CHLORIDE CRYS ER 20 MEQ PO TBCR: 20.0000 meq | EXTENDED_RELEASE_TABLET | Freq: Two times a day (BID) | ORAL | 0 refills | Status: DC

## 2022-11-12 ENCOUNTER — Encounter (HOSPITAL_COMMUNITY)
Admission: RE | Disposition: A | Discharge status: Home / Self Care | Payer: Self-pay | Source: Home / Self Care | Attending: Internal Medicine

## 2022-11-12 ENCOUNTER — Ambulatory Visit (HOSPITAL_BASED_OUTPATIENT_CLINIC_OR_DEPARTMENT_OTHER): Payer: PPO | Admitting: Anesthesiology

## 2022-11-12 ENCOUNTER — Ambulatory Visit (HOSPITAL_COMMUNITY): Payer: PPO | Admitting: Anesthesiology

## 2022-11-12 ENCOUNTER — Ambulatory Visit (HOSPITAL_COMMUNITY): Admission: RE | Admit: 2022-11-12 | Payer: PPO | Source: Home / Self Care

## 2022-11-12 ENCOUNTER — Encounter (HOSPITAL_COMMUNITY): Payer: Self-pay

## 2022-11-12 ENCOUNTER — Other Ambulatory Visit: Payer: Self-pay

## 2022-11-12 DIAGNOSIS — I10 Essential (primary) hypertension: Secondary | ICD-10-CM | POA: Insufficient documentation

## 2022-11-12 DIAGNOSIS — M1990 Unspecified osteoarthritis, unspecified site: Secondary | ICD-10-CM | POA: Diagnosis not present

## 2022-11-12 DIAGNOSIS — L4050 Arthropathic psoriasis, unspecified: Secondary | ICD-10-CM | POA: Insufficient documentation

## 2022-11-12 DIAGNOSIS — E119 Type 2 diabetes mellitus without complications: Secondary | ICD-10-CM | POA: Diagnosis not present

## 2022-11-12 DIAGNOSIS — R12 Heartburn: Secondary | ICD-10-CM | POA: Diagnosis not present

## 2022-11-12 DIAGNOSIS — K222 Esophageal obstruction: Secondary | ICD-10-CM | POA: Diagnosis not present

## 2022-11-12 DIAGNOSIS — K449 Diaphragmatic hernia without obstruction or gangrene: Secondary | ICD-10-CM

## 2022-11-12 DIAGNOSIS — K219 Gastro-esophageal reflux disease without esophagitis: Secondary | ICD-10-CM | POA: Diagnosis not present

## 2022-11-12 DIAGNOSIS — K2970 Gastritis, unspecified, without bleeding: Secondary | ICD-10-CM | POA: Diagnosis not present

## 2022-11-12 DIAGNOSIS — R1310 Dysphagia, unspecified: Secondary | ICD-10-CM | POA: Diagnosis not present

## 2022-11-12 DIAGNOSIS — Z7984 Long term (current) use of oral hypoglycemic drugs: Secondary | ICD-10-CM | POA: Diagnosis not present

## 2022-11-12 HISTORY — PX: ESOPHAGOGASTRODUODENOSCOPY (EGD) WITH PROPOFOL: SHX5813

## 2022-11-12 HISTORY — PX: BALLOON DILATION: SHX5330

## 2022-11-12 HISTORY — PX: BIOPSY: SHX5522

## 2022-11-12 LAB — GLUCOSE, CAPILLARY: Glucose-Capillary: 116 mg/dL — ABNORMAL HIGH (ref 70–99)

## 2022-11-12 SURGERY — ESOPHAGOGASTRODUODENOSCOPY (EGD) WITH PROPOFOL
Anesthesia: General

## 2022-11-12 MED ORDER — LACTATED RINGERS IV SOLN: INTRAVENOUS | Status: DC

## 2022-11-12 MED ORDER — PROPOFOL 10 MG/ML IV BOLUS
INTRAVENOUS | Status: DC | PRN
  Administered 2022-11-12: 150 ug/kg/min via INTRAVENOUS

## 2022-11-12 MED ORDER — PROPOFOL 500 MG/50ML IV EMUL
INTRAVENOUS | Status: DC | PRN
  Administered 2022-11-12: 80 ug/kg/min via INTRAVENOUS

## 2022-11-12 MED ORDER — LIDOCAINE HCL (CARDIAC) PF 100 MG/5ML IV SOSY
PREFILLED_SYRINGE | INTRAVENOUS | Status: DC | PRN
Start: 2022-11-12 — End: 2022-11-12
  Administered 2022-11-12: 75 mg via INTRATRACHEAL

## 2022-11-12 MED ORDER — PROPOFOL 1000 MG/100ML IV EMUL
INTRAVENOUS | Status: AC
  Filled 2022-11-12: qty 100

## 2022-11-12 MED ORDER — LIDOCAINE HCL (PF) 2 % IJ SOLN
INTRAMUSCULAR | Status: AC
  Filled 2022-11-12: qty 5

## 2022-11-12 NOTE — Anesthesia Preprocedure Evaluation (Signed)
Anesthesia Evaluation  Patient identified by MRN, date of birth, ID band Patient awake    Reviewed: Allergy & Precautions, H&P , NPO status , Patient's Chart, lab work & pertinent test results, reviewed documented beta blocker date and time   Airway Mallampati: II  TM Distance: >3 FB Neck ROM: Full    Dental  (+) Dental Advisory Given, Missing   Pulmonary asthma , pneumonia   Pulmonary exam normal breath sounds clear to auscultation       Cardiovascular hypertension, Pt. on medications and Pt. on home beta blockers negative cardio ROS Normal cardiovascular exam Rhythm:Regular Rate:Normal     Neuro/Psych  Headaches  Neuromuscular disease  negative psych ROS   GI/Hepatic Neg liver ROS,GERD  Medicated,,  Endo/Other  diabetes, Well Controlled, Type 2, Oral Hypoglycemic Agents    Renal/GU Renal disease  negative genitourinary   Musculoskeletal  (+) Arthritis  (psoriatic), Osteoarthritis,  Fibromyalgia -  Abdominal   Peds negative pediatric ROS (+)  Hematology negative hematology ROS (+)   Anesthesia Other Findings Back pain  Reproductive/Obstetrics negative OB ROS                             Anesthesia Physical Anesthesia Plan  ASA: 3  Anesthesia Plan: General   Post-op Pain Management: Minimal or no pain anticipated   Induction: Intravenous  PONV Risk Score and Plan: 0 and Propofol infusion  Airway Management Planned: Nasal Cannula and Natural Airway  Additional Equipment:   Intra-op Plan:   Post-operative Plan:   Informed Consent: I have reviewed the patients History and Physical, chart, labs and discussed the procedure including the risks, benefits and alternatives for the proposed anesthesia with the patient or authorized representative who has indicated his/her understanding and acceptance.     Dental advisory given  Plan Discussed with: CRNA and Surgeon  Anesthesia  Plan Comments:         Anesthesia Quick Evaluation

## 2022-11-12 NOTE — H&P (Signed)
Primary Care Physician:  Carylon Perches, MD Primary Gastroenterologist:  Dr. Marletta Lor  Pre-Procedure History & Physical: HPI:  Tonya Hall is a 72 y.o. female is here for an EGD with possible dilation to be performed for GERD and dysphagia  Past Medical History:  Diagnosis Date   Asthma    Albuterol in haler prn   Cataract    immature unsure which eye   Chronic back pain    COVID-19 October/November 2020   Degenerative disk disease    psoriatic   Diabetes mellitus without complication (HCC)    diet controlled   Esophageal motility disorder    Non-specific, see modified barium study/speech path, BP   Fibromyalgia    GERD (gastroesophageal reflux disease)    takes Protonix bid   Headache 2019   History of blood transfusion    post c-section   History of bronchitis    HTN (hypertension)    Hyperlipidemia    takes Pravastatin daily   Lymphocytic colitis 05/26/2010   Responded to Entocort x 3 MOS   Neuropathy    Nonallergic rhinitis    Osteoporosis    gets Boniva every 3 months   Peripheral edema    takes HCTZ daily   Pneumonia 02/2019   Psoriatic arthritis (HCC)    Dr. Anthonette Legato   Psoriatic arthritis [facility])    bilateral hands   Raynaud's disease    Seasonal allergies    Shingles 2020   SUI (stress urinary incontinence, female)    Urinary urgency     Past Surgical History:  Procedure Laterality Date   ANTERIOR CERVICAL DECOMP/DISCECTOMY FUSION N/A 03/09/2020   Procedure: ANTERIOR CERVICAL DECOMPRESSION/DISCECTOMY FUSION, INTERBODY PROSTHESIS, PLATE SCREWS CERVICAL FIVE-SIX, CERVICAL SIX-SEVEN;  Surgeon: Tressie Stalker, MD;  Location: [facility] OR;  Service: Neurosurgery;  Laterality: N/A;   BACK SURGERY  2003   BIOPSY  04/17/2016   Procedure: BIOPSY;  Surgeon: West Bali, MD;  Location: [facility] SUITE;  Service: Endoscopy;;  random colon bx's   CERVICAL SPINE SURGERY  09/09/2017   CESAREAN SECTION  1974, 1978       COLONOSCOPY  06/19/2008   LKG:MWNUU internal  hemorrhoids/mild sigmoin colon diverticulosis   COLONOSCOPY  2012   Dr. Jena Gauss: normal rectum, diverticula, lymphocytic colitis    COLONOSCOPY WITH PROPOFOL N/A 04/17/2016   Dr. Darrick Penna: Normal terminal ileum, internal/external hemorrhoids, random colon biopsies consistent with collagenous colitis   DILATION AND CURETTAGE OF UTERUS  1977   spontaneous abortion   ESOPHAGOGASTRODUODENOSCOPY  06/19/2008   VOZ:[id number] gastritis   LUMBAR DISC ARTHROPLASTY  09/1996   LUMBAR FUSION  6/03, 11/08, 11/14   L4-5 fusion, L2-3 fusion, L1-2   LUMBAR FUSION  08/03/2020   L4-L5   LUMBAR LAMINECTOMY  03/2020   MOUTH SURGERY  12/07/2021   oral surgery   ODONTOID SCREW INSERTION N/A 01/27/2018   Procedure: ODONTOID SCREW INSERTION;  Surgeon: Tressie Stalker, MD;  Location: [facility] OR;  Service: Neurosurgery;  Laterality: N/A;  ODONTOID SCREW INSERTION   TONSILLECTOMY     TONSILLECTOMY AND ADENOIDECTOMY     TUBAL LIGATION      Prior to Admission medications   Medication Sig Start Date End Date Taking? Authorizing Provider  Apremilast (OTEZLA) 30 MG TABS Take 1 tablet (30 mg total) by mouth 2 (two) times daily. 08/13/22  Yes Deveshwar, Janalyn Rouse, MD  aspirin 81 MG tablet Take 81 mg by mouth daily.   Yes [provider]  Blood Glucose Monitoring Suppl (ONETOUCH VERIO FLEX SYSTEM) w/Device  KIT  05/15/22  Yes [provider]  cetirizine (ZYRTEC) 10 MG tablet Take 10 mg by mouth daily as needed (allergies).   Yes [provider]  chlorhexidine (PERIDEX) 0.12 % solution Use as directed 15 mLs in the mouth or throat at bedtime. 12/18/21  Yes [provider]  fish oil-omega-3 fatty acids 1000 MG capsule Take 2 capsules (2 g total) by mouth daily. 2-4 GRAMS DAILY Patient taking differently: Take 1 g by mouth 2 (two) times daily. 2-4 GRAMS DAILY 03/29/22  Yes Hilty, Lisette Abu, MD  gabapentin (NEURONTIN) 600 MG tablet Take 1 tablet (600 mg total) by mouth 3 (three) times daily. To take  with lyrica- lower doses on both, so has less side effects- for nerve pain 07/12/22  Yes Lovorn, Megan, MD  Ginger, Zingiber officinalis, (GINGER ROOT) 500 MG CAPS Take 500 mg by mouth daily.   Yes [provider]  Glucosamine Sulfate 1000 MG CAPS Take 2,000 mg by mouth daily. 07/03/11  Yes Hensel, Santiago Bumpers, MD  HYDROcodone-acetaminophen (NORCO/VICODIN) 5-325 MG tablet Take 1 tablet by mouth 2 (two) times daily as needed. 09/19/22  Yes Lovorn, Aundra Millet, MD  hydrocortisone sodium succinate (SOLU-CORTEF) 100 MG SOLR injection Inject 100 mg into the muscle daily as needed (when unable to keep down prednisone tablet).    Yes [provider]  Lancets (ONETOUCH DELICA PLUS LANCET33G) MISC USE TO TEST 3 TIMESUDAILY. 07/01/19  Yes [provider]  leflunomide (ARAVA) 20 MG tablet TAKE 1 TABLET BY MOUTH ONCE DAILY. 10/26/22  Yes Gearldine Bienenstock, PA-C  losartan (COZAAR) 100 MG tablet Take 100 mg by mouth daily.   Yes [provider]  metoprolol succinate (TOPROL-XL) 50 MG 24 hr tablet Take 1 tablet (50 mg total) by mouth daily. Take with or immediately following a meal. 07/03/11  Yes Hensel, Santiago Bumpers, MD  Multiple Vitamin (MULTIVITAMIN) tablet Take 1 tablet by mouth daily.   Yes [provider]  nortriptyline (PAMELOR) 25 MG capsule TAKE (1) CAPSULE BY MOUTH AT BEDTIME. 12/05/17  Yes Gearldine Bienenstock, PA-C  [facility] VERIO test strip  03/05/18  Yes [provider]  oxybutynin (DITROPAN-XL) 5 MG 24 hr tablet TAKE 1 TABLET BY MOUTH AT BEDTIME. 08/25/22  Yes Jerene Bears, MD  pantoprazole (PROTONIX) 40 MG tablet Take 1 tablet (40 mg total) by mouth 2 (two) times daily before a meal. 30 minutes prior 12/14/21  Yes Gelene Mink, NP  predniSONE (DELTASONE) 5 MG tablet TAKE 1 TABLET BY MOUTH DAILY WITH BREAKFAST 10/07/22  Yes Gearldine Bienenstock, PA-C  spironolactone (ALDACTONE) 25 MG tablet Take 25 mg by mouth daily.   Yes [provider]  SURE COMFORT PEN NEEDLES 31G X 5 MM  MISC  05/05/18  Yes [provider]  TALTZ 80 MG/ML SOAJ INJECT 80MG  (1 PEN) UNDER THE SKIN EVERY 4 WEEKS 06/19/22  Yes Gearldine Bienenstock, PA-C  topiramate (TOPAMAX) 200 MG tablet TAKE (1) TABLET BY MOUTH ONCE AT BEDTIME. 09/03/22  Yes Gearldine Bienenstock, PA-C  Turmeric 500 MG CAPS Take 500 mg by mouth every morning.   Yes [provider]  albuterol (PROVENTIL HFA;VENTOLIN HFA) 108 (90 BASE) MCG/ACT inhaler Inhale 2 puffs into the lungs every 6 (six) hours as needed for wheezing or shortness of breath.     [provider]  Evolocumab (REPATHA SURECLICK) 140 MG/ML SOAJ INJECT 1 DOSE INTO THE SKIN EVERY 14 DAYS 10/15/22   Hilty, Lisette Abu, MD  Misc Natural  Products (TART CHERRY ADVANCED PO) Take 1 tablet by mouth daily. daily    [provider]  OZEMPIC, 0.25 OR 0.5 MG/DOSE, 2 MG/3ML SOPN Inject 0.5 mg into the skin every Thursday. 09/26/21   [provider]  Polyethyl Glycol-Propyl Glycol (SYSTANE OP) Place 1 drop into both eyes 4 (four) times daily as needed (dry eyes).    [provider]  potassium chloride SA (KLOR-CON M) 20 MEQ tablet Take 1 tablet (20 mEq total) by mouth 2 (two) times daily for 4 days. 11/09/22 11/13/22  Lanelle Bal, DO  pregabalin (LYRICA) 100 MG capsule Take 1 capsule (100 mg total) by mouth 3 (three) times daily. Take WITH gabapentin- both at lower doses to reduce side effects- for nerve pain Patient taking differently: Take 100 mg by mouth 2 (two) times daily. Take WITH gabapentin- both at lower doses to reduce side effects- for nerve pain 07/12/22   Genice Rouge, MD    Allergies as of 10/19/2022 - Review Complete 09/20/2022  Allergen Reaction Noted   Adalimumab Rash and Other (See Comments) 05/09/2021   Imuran [azathioprine sodium] Rash 07/03/2011   Methotrexate Other (See Comments) 05/09/2021   Plaquenil [hydroxychloroquine sulfate] Rash 07/03/2011   Sulfonamide derivatives Rash    Azathioprine  05/09/2021   Ceftin  [cefuroxime axetil] Nausea Only 07/06/2014   Cefuroxime  05/09/2021   Lisinopril  09/22/2021   Sulfamethoxazole-trimethoprim  09/22/2021   Morphine Nausea And Vomiting     Family History  Problem Relation Age of Onset   Diabetes Mother    Pancreatitis Mother    Arthritis Mother        psoriatic    Fibromyalgia Mother    Rheumatic fever Father    Diabetes Other        grandparents   Skin cancer Other        grandfather   Hypertension Other        grandparent   Congestive Heart Failure Other        grandfather   Parkinson's disease Other        grandmother   Colon cancer Maternal Uncle    Fibromyalgia Sister    Rheum arthritis Sister    Diabetes Sister    Fibromyalgia Sister    Diabetes Sister    Rheum arthritis Sister    Hypertension Son    Colon polyps Neg Hx     Social History   Socioeconomic History   Marital status: Married    Spouse name: Not on file   Number of children: Not on file   Years of education: Not on file   Highest education level: Not on file  Occupational History   Occupation: Case Midwife    Employer: [employer]    Comment: Estate manager/land agent  Tobacco Use   Smoking status: Never    Passive exposure: Never   Smokeless tobacco: Never  Vaping Use   Vaping status: Never Used  Substance and Sexual Activity   Alcohol use: No   Drug use: No   Sexual activity: Not Currently    Partners: Male    Birth control/protection: Surgical    Comment: BTL  Other Topics Concern   Not on file  Social History Narrative   ATTENDS [employer]. 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   Social Determinants of Health   Financial Resource Strain: Not on file  Food Insecurity: Not on file  Transportation Needs: Not on file  Physical Activity: Not on file  Stress: Not on file  Social Connections: Not on file  Intimate Partner Violence: Not on file    Review of Systems: General: Negative for fever, chills, fatigue, weakness. Eyes: Negative for vision changes.   ENT: Negative for hoarseness, difficulty swallowing , nasal congestion. CV: Negative for chest pain, angina, palpitations, dyspnea on exertion, peripheral edema.  Respiratory: Negative for dyspnea at rest, dyspnea on exertion, cough, sputum, wheezing.  GI: See history of present illness. GU:  Negative for dysuria, hematuria, urinary incontinence, urinary frequency, nocturnal urination.  MS: Negative for joint pain, low back pain.  Derm: Negative for rash or itching.  Neuro: Negative for weakness, abnormal sensation, seizure, frequent headaches, memory loss, confusion.  Psych: Negative for anxiety, depression Endo: Negative for unusual weight change.  Heme: Negative for bruising or bleeding. Allergy: Negative for rash or hives.  Physical Exam: Vital signs in last 24 hours: Temp:  [98 F (36.7 C)-99.4 F (37.4 C)] 98 F (36.7 C) (08/12 0726) Pulse Rate:  [74] 74 (08/12 0656) Resp:  [16] 16 (08/12 0656) BP: (155)/(68) 155/68 (08/12 0656) SpO2:  [98 %] 98 % (08/12 0656) Weight:  [54.9 kg] 54.9 kg (08/12 0656)   General:   Alert,  Well-developed, well-nourished, pleasant and cooperative in NAD Head:  Normocephalic and atraumatic. Eyes:  Sclera clear, no icterus.   Conjunctiva pink. Ears:  Normal auditory acuity. Nose:  No deformity, discharge,  or lesions. Msk:  Symmetrical without gross deformities. Normal posture. Extremities:  Without clubbing or edema. Neurologic:  Alert and  oriented x4;  grossly normal neurologically. Skin:  Intact without significant lesions or rashes. Psych:  Alert and cooperative. Normal mood and affect.   Impression/Plan: Tonya Hall is here for an EGD with possible dilation to be performed for GERD and dysphagia  Risks, benefits, limitations, imponderables and alternatives regarding procedure have been reviewed with the patient. Questions have been answered. All parties agreeable.

## 2022-11-12 NOTE — Transfer of Care (Addendum)
Immediate Anesthesia Transfer of Care Note  Patient: Tonya Hall  Procedure(s) Performed: ESOPHAGOGASTRODUODENOSCOPY (EGD) WITH PROPOFOL BALLOON DILATION BIOPSY  Patient Location: Short Stay  Anesthesia Type:General  Level of Consciousness: drowsy and patient cooperative  Airway & Oxygen Therapy: Patient Spontanous Breathing and Patient connected to nasal cannula oxygen  Post-op Assessment: Report given to RN and Post -op Vital signs reviewed and stable  Post vital signs: Reviewed and stable  Last Vitals:  Vitals Value Taken Time  BP 118/52 11/12/22   0830  Temp 36.6 11/12/22   0830  Pulse 78 11/12/22   0830  Resp 19 11/12/22   0830  SpO2 98% 11/12/22   0830    Last Pain:  Vitals:   11/12/22 0815  TempSrc:   PainSc: 3          Complications: No notable events documented.

## 2022-11-12 NOTE — Anesthesia Postprocedure Evaluation (Signed)
Anesthesia Post Note  Patient: [name]  Procedure(s) Performed: ESOPHAGOGASTRODUODENOSCOPY (EGD) WITH PROPOFOL BALLOON DILATION BIOPSY  Patient location during evaluation: Phase II Anesthesia Type: General Level of consciousness: awake and alert and oriented Pain management: pain level controlled Vital Signs Assessment: post-procedure vital signs reviewed and stable Respiratory status: spontaneous breathing, nonlabored ventilation and respiratory function stable Cardiovascular status: blood pressure returned to baseline and stable Postop Assessment: no apparent nausea or vomiting Anesthetic complications: no  No notable events documented.   Last Vitals:  Vitals:   11/12/22 0726 11/12/22 0830  BP:  (!) 118/52  Pulse:  79  Resp:  19  Temp: 36.7 C 36.6 C  SpO2:  98%    Last Pain:  Vitals:   11/12/22 0830  TempSrc: Oral  PainSc: 0-No pain                  C 

## 2022-11-12 NOTE — Discharge Instructions (Signed)
EGD Discharge instructions Please read the instructions outlined below and refer to this sheet in the next few weeks. These discharge instructions provide you with general information on caring for yourself after you leave the hospital. Your doctor may also give you specific instructions. While your treatment has been planned according to the most current medical practices available, unavoidable complications occasionally occur. If you have any problems or questions after discharge, please call your doctor. ACTIVITY You may resume your regular activity but move at a slower pace for the next 24 hours.  Take frequent rest periods for the next 24 hours.  Walking will help expel (get rid of) the air and reduce the bloated feeling in your abdomen.  No driving for 24 hours (because of the anesthesia (medicine) used during the test).  You may shower.  Do not sign any important legal documents or operate any machinery for 24 hours (because of the anesthesia used during the test).  NUTRITION Drink plenty of fluids.  You may resume your normal diet.  Begin with a light meal and progress to your normal diet.  Avoid alcoholic beverages for 24 hours or as instructed by your caregiver.  MEDICATIONS You may resume your normal medications unless your caregiver tells you otherwise.  WHAT YOU CAN EXPECT TODAY You may experience abdominal discomfort such as a feeling of fullness or "gas" pains.  FOLLOW-UP Your doctor will discuss the results of your test with you.  SEEK IMMEDIATE MEDICAL ATTENTION IF ANY OF THE FOLLOWING OCCUR: Excessive nausea (feeling sick to your stomach) and/or vomiting.  Severe abdominal pain and distention (swelling).  Trouble swallowing.  Temperature over 101 F (37.8 C).  Rectal bleeding or vomiting of blood.   Your EGD revealed mild amount inflammation in your stomach.  I took biopsies of this to rule out infection with a bacteria called H. pylori.  Await pathology results, my  office will contact you.  Small hiatal hernia noted.   You also had a mild tightening of your esophagus called a Schatzki's ring.  I stretched this out today.  Hopefully this helps with your feeling of food going down slowly.  Small bowel appeared normal.  Continue on pantoprazole twice daily.  Follow-up with GI in 2 to 3 months.   I hope you have a great rest of your week!  Hennie Duos. Marletta Lor, D.O. Gastroenterology and Hepatology [facility]

## 2022-11-12 NOTE — Op Note (Signed)
[facility] Patient Name: Tonya Hall Procedure Date: 11/12/2022 8:07 AM MRN: [id number] Date of Birth: 1950/06/17 Attending MD: Hennie Duos. Marletta Lor , [place], [id number] CSN: [id number] Age: 72 Admit Type: Outpatient Procedure:                Upper GI endoscopy Indications:              Dysphagia, Heartburn Providers:                Hennie Duos. Marletta Lor, DO, Tammy Vaught, RN, Zena Amos Referring MD:              Medicines:                See the Anesthesia note for documentation of the                            administered medications Complications:            No immediate complications. Estimated Blood Loss:     Estimated blood loss was minimal. Procedure:                Pre-Anesthesia Assessment:                           - The anesthesia plan was to use monitored                            anesthesia care (MAC).                           After obtaining informed consent, the endoscope was                            passed under direct vision. Throughout the                            procedure, the patient's blood pressure, pulse, and                            oxygen saturations were monitored continuously. The                            GIF-H190 ([id number]) scope was introduced through the                            mouth, and advanced to the second part of duodenum.                            The upper GI endoscopy was accomplished without                            difficulty. The patient tolerated the procedure                            well. Scope In: 8:20:01 AM Scope  Out: 8:24:38 AM Total Procedure Duration: 0 hours 4 minutes 37 seconds  Findings:      A small hiatal hernia was present.      A mild Schatzki ring was found in the distal esophagus. A TTS dilator       was passed through the scope. Dilation with an 18-19-20 mm balloon       dilator was performed to 20 mm. The dilation site was examined and       showed moderate improvement  in luminal narrowing.      Diffuse mild inflammation characterized by congestion (edema) and       erythema was found in the gastric body and in the gastric antrum.       Biopsies were taken with a cold forceps for Helicobacter pylori testing.      The duodenal bulb, first portion of the duodenum and second portion of       the duodenum were normal. Impression:               - Small hiatal hernia.                           - Mild Schatzki ring. Dilated.                           - Gastritis. Biopsied.                           - Normal duodenal bulb, first portion of the                            duodenum and second portion of the duodenum. Moderate Sedation:      Per Anesthesia Care Recommendation:           - Patient has a contact number available for                            emergencies. The signs and symptoms of potential                            delayed complications were discussed with the                            patient. Return to normal activities tomorrow.                            Written discharge instructions were provided to the                            patient.                           - Resume previous diet.                           - Continue present medications.                           - Await pathology results.                           -  Repeat upper endoscopy PRN for retreatment.                           - Return to GI clinic in 3 months.                           - Use a proton pump inhibitor PO BID. Procedure Code(s):        --- Professional ---                           [phone], Esophagogastroduodenoscopy, flexible,                            transoral; with transendoscopic balloon dilation of                            esophagus (less than 30 mm diameter)                           [id number], 59, Esophagogastroduodenoscopy, flexible,                            transoral; with biopsy, single or multiple Diagnosis Code(s):        --- Professional ---                            K44.9, Diaphragmatic hernia without obstruction or                            gangrene                           K22.2, Esophageal obstruction                           K29.70, Gastritis, unspecified, without bleeding                           R13.10, Dysphagia, unspecified                           R12, Heartburn CPT copyright 2022 American Medical Association. All rights reserved. The codes documented in this report are preliminary and upon coder review may  be revised to meet current compliance requirements. Hennie Duos. Marletta Lor, DO Hennie Duos. Marletta Lor, DO 11/12/2022 8:28:18 AM This report has been signed electronically. Number of Addenda: 0

## 2022-11-14 ENCOUNTER — Encounter (HOSPITAL_COMMUNITY): Payer: Self-pay | Admitting: Internal Medicine

## 2022-11-19 ENCOUNTER — Telehealth: Payer: Self-pay | Admitting: *Deleted

## 2022-11-19 ENCOUNTER — Encounter (HOSPITAL_COMMUNITY)
Admission: RE | Admit: 2022-11-19 | Discharge: 2022-11-19 | Disposition: A | Discharge status: Home / Self Care | Payer: PPO | Source: Ambulatory Visit | Attending: Endocrinology | Admitting: Endocrinology

## 2022-11-19 DIAGNOSIS — D692 Other nonthrombocytopenic purpura: Secondary | ICD-10-CM | POA: Insufficient documentation

## 2022-11-19 DIAGNOSIS — K52831 Collagenous colitis: Secondary | ICD-10-CM | POA: Insufficient documentation

## 2022-11-19 DIAGNOSIS — M170 Bilateral primary osteoarthritis of knee: Secondary | ICD-10-CM | POA: Insufficient documentation

## 2022-11-19 DIAGNOSIS — Z79899 Other long term (current) drug therapy: Secondary | ICD-10-CM | POA: Insufficient documentation

## 2022-11-19 DIAGNOSIS — G8929 Other chronic pain: Secondary | ICD-10-CM | POA: Insufficient documentation

## 2022-11-19 DIAGNOSIS — E2740 Unspecified adrenocortical insufficiency: Secondary | ICD-10-CM | POA: Insufficient documentation

## 2022-11-19 DIAGNOSIS — M5030 Other cervical disc degeneration, unspecified cervical region: Secondary | ICD-10-CM | POA: Insufficient documentation

## 2022-11-19 DIAGNOSIS — L4050 Arthropathic psoriasis, unspecified: Secondary | ICD-10-CM | POA: Insufficient documentation

## 2022-11-19 DIAGNOSIS — Z8639 Personal history of other endocrine, nutritional and metabolic disease: Secondary | ICD-10-CM | POA: Insufficient documentation

## 2022-11-19 DIAGNOSIS — M7061 Trochanteric bursitis, right hip: Secondary | ICD-10-CM | POA: Insufficient documentation

## 2022-11-19 DIAGNOSIS — M7062 Trochanteric bursitis, left hip: Secondary | ICD-10-CM | POA: Insufficient documentation

## 2022-11-19 DIAGNOSIS — L409 Psoriasis, unspecified: Secondary | ICD-10-CM | POA: Insufficient documentation

## 2022-11-19 DIAGNOSIS — M810 Age-related osteoporosis without current pathological fracture: Secondary | ICD-10-CM | POA: Insufficient documentation

## 2022-11-19 DIAGNOSIS — M533 Sacrococcygeal disorders, not elsewhere classified: Secondary | ICD-10-CM | POA: Insufficient documentation

## 2022-11-19 DIAGNOSIS — K224 Dyskinesia of esophagus: Secondary | ICD-10-CM | POA: Insufficient documentation

## 2022-11-19 DIAGNOSIS — I10 Essential (primary) hypertension: Secondary | ICD-10-CM | POA: Insufficient documentation

## 2022-11-19 DIAGNOSIS — M5134 Other intervertebral disc degeneration, thoracic region: Secondary | ICD-10-CM | POA: Insufficient documentation

## 2022-11-19 DIAGNOSIS — G894 Chronic pain syndrome: Secondary | ICD-10-CM | POA: Insufficient documentation

## 2022-11-19 DIAGNOSIS — M5136 Other intervertebral disc degeneration, lumbar region: Secondary | ICD-10-CM | POA: Insufficient documentation

## 2022-11-19 DIAGNOSIS — I7300 Raynaud's syndrome without gangrene: Secondary | ICD-10-CM | POA: Insufficient documentation

## 2022-11-19 DIAGNOSIS — Z8719 Personal history of other diseases of the digestive system: Secondary | ICD-10-CM | POA: Insufficient documentation

## 2022-11-19 DIAGNOSIS — R600 Localized edema: Secondary | ICD-10-CM | POA: Insufficient documentation

## 2022-11-19 NOTE — Telephone Encounter (Signed)
Patient requesting refill Hydrocodone @ [address].

## 2022-11-19 NOTE — Progress Notes (Unsigned)
Office Visit Note  Patient: Tonya Hall             Date of Birth: 21-Aug-1950           MRN: [id number]             PCP: Carylon Perches, MD Referring: Carylon Perches, MD Visit Date: 11/22/2022 Occupation: @GUAROCC @  Subjective:  Right hip pain   History of Present Illness: Tonya Hall is a 72 y.o. female with history of psoriatic arthritis, osteoarthritis, and DDD. She remains on Taltz 80 mg sq injections q 4 weeks, Arava 20mg  1 tablet daily, Otezla 30 mg 1 tablet by mouth twice daily, and prednisone 5 mg 1 tablet daily.  She is tolerating combination therapy without any side effects and has not missed any doses recently.  She denies any signs or symptoms of a psoriatic arthritis flare recently.  Patient presents today with acute pain in the right hip.  Patient states that the pain started 1 week ago with no injury or fall.  Most of her discomfort is on the lateral aspect consistent with trochanter bursitis.  She is requested a right trochanteric bursa cortisone injection today.  She has been using a cane to assist with ambulation.  Patient states that her SI joint pain has been manageable.  She continues to have chronic pain in the thoracic spine.  Patient had inadequate response to injections in the thoracic spine previously.  She remains under the care of Dr. Berline Chough for pain management.  She has discontinued the use of Lyrica due to no clinical benefit.  She denies any Achilles tendinitis or plantar fasciitis at this time.  She continues to follow-up with Dr. Donzetta Matters every 3 months for foot exams.    Activities of Daily Living:  Patient reports morning stiffness for all day.  Patient Reports nocturnal pain.  Difficulty dressing/grooming: Reports Difficulty climbing stairs: Reports Difficulty getting out of chair: Reports Difficulty using hands for taps, buttons, cutlery, and/or writing: Reports  Review of Systems  Constitutional:  Positive for fatigue.  HENT:  Positive for mouth  dryness. Negative for mouth sores.   Eyes:  Positive for dryness.  Respiratory:  Negative for shortness of breath.   Cardiovascular:  Negative for chest pain and palpitations.  Gastrointestinal:  Negative for blood in stool, constipation and diarrhea.  Endocrine: Negative for increased urination.  Genitourinary:  Negative for involuntary urination.  Musculoskeletal:  Positive for joint pain, gait problem, joint pain, joint swelling, myalgias, muscle weakness, morning stiffness, muscle tenderness and myalgias.  Skin:  Positive for color change. Negative for rash, hair loss and sensitivity to sunlight.  Allergic/Immunologic: Negative for susceptible to infections.  Neurological:  Positive for headaches. Negative for dizziness.  Hematological:  Positive for swollen glands.  Psychiatric/Behavioral:  Positive for sleep disturbance. Negative for depressed mood. The patient is not nervous/anxious.     PMFS History:  Patient Active Problem List   Diagnosis Date Noted   Statin not tolerated 10/23/2021   Chronic allergic conjunctivitis 07/29/2021   Vasomotor rhinitis 07/29/2021   Benign neoplasm of skin of lower limb 05/17/2021   History of neoplasm 05/17/2021   Melanocytic nevi of left upper limb, including shoulder 05/17/2021   Melanocytic nevi of trunk 05/17/2021   Other seborrheic keratosis 05/17/2021   Diarrhea 03/10/2021   Fracture of pelvis (HCC) 01/27/2021   Pain of left hip joint 01/06/2021   Foraminal stenosis of lumbar region 08/03/2020   Cervical spondylosis with myelopathy and radiculopathy 03/09/2020  AKI (acute kidney injury) (HCC) 02/07/2019   Acute respiratory failure with hypoxia (HCC) 02/06/2019   Acute respiratory disease due to COVID-19 virus 02/06/2019   Community acquired pneumonia 02/06/2019   Hyponatremia 02/06/2019   Asthma    Diabetes mellitus without complication (HCC)    Closed nondisplaced odontoid fracture with type II morphology (HCC) 01/27/2018    Odontoid fracture (HCC) 01/24/2018   Chest pain 11/14/2017   Transaminitis 06/22/2016   Collagenous colitis 05/23/2016   Psoriasis 05/21/2016   High risk medication use 02/15/2016   Age-related osteoporosis without current pathological fracture 02/15/2016   Trochanteric bursitis, left hip 02/15/2016   Sacroiliitis, not elsewhere classified (HCC) 02/15/2016   Chronic pain syndrome 02/15/2016   Abnormal weight gain 06/25/2012   Hypertension 07/03/2011   Hypercholesteremia 07/03/2011   Psoriatic arthritis (HCC) 07/03/2011   Osteoarthritis of multiple joints 07/03/2011   Esophageal motility disorder 02/14/2011   Cough 02/14/2011   GERD 05/05/2010    Past Medical History:  Diagnosis Date   Asthma    Albuterol in haler prn   Cataract    immature unsure which eye   Chronic back pain    COVID-19 October/November 2020   Degenerative disk disease    psoriatic   Diabetes mellitus without complication (HCC)    diet controlled   Esophageal motility disorder    Non-specific, see modified barium study/speech path, BP   Fibromyalgia    GERD (gastroesophageal reflux disease)    takes Protonix bid   Headache 2019   History of blood transfusion    post c-section   History of bronchitis    HTN (hypertension)    Hyperlipidemia    takes Pravastatin daily   Lymphocytic colitis 05/26/2010   Responded to Entocort x 3 MOS   Neuropathy    Nonallergic rhinitis    Osteoporosis    gets Boniva every 3 months   Peripheral edema    takes HCTZ daily   Pneumonia 02/2019   Psoriatic arthritis (HCC)    Dr. Anthonette Legato   Psoriatic arthritis [facility])    bilateral hands   Raynaud's disease    Seasonal allergies    Shingles 2020   SUI (stress urinary incontinence, female)    Urinary urgency     Family History  Problem Relation Age of Onset   Diabetes Mother    Pancreatitis Mother    Arthritis Mother        psoriatic    Fibromyalgia Mother    Rheumatic fever Father    Diabetes Other         grandparents   Skin cancer Other        grandfather   Hypertension Other        grandparent   Congestive Heart Failure Other        grandfather   Parkinson's disease Other        grandmother   Colon cancer Maternal Uncle    Fibromyalgia Sister    Rheum arthritis Sister    Diabetes Sister    Fibromyalgia Sister    Diabetes Sister    Rheum arthritis Sister    Hypertension Son    Colon polyps Neg Hx    Past Surgical History:  Procedure Laterality Date   ANTERIOR CERVICAL DECOMP/DISCECTOMY FUSION N/A 03/09/2020   Procedure: ANTERIOR CERVICAL DECOMPRESSION/DISCECTOMY FUSION, INTERBODY PROSTHESIS, PLATE SCREWS CERVICAL FIVE-SIX, CERVICAL SIX-SEVEN;  Surgeon: Tressie Stalker, MD;  Location: [facility] OR;  Service: Neurosurgery;  Laterality: N/A;   BACK SURGERY  2003   BALLOON  DILATION N/A 11/12/2022   Procedure: BALLOON DILATION;  Surgeon: Lanelle Bal, DO;  Location: [facility] SU[facility];  Service: Endoscopy;  Laterality: N/A;   BIOPSY  04/17/2016   Procedure: BIOPSY;  Surgeon: West Bali, MD;  Location: [facility];  Service: Endoscopy;;  random colon bx's   BIOPSY  11/12/2022   Procedure: BIOPSY;  Surgeon: Lanelle Bal, DO;  Location: [facility];  Service: Endoscopy;;   CERVICAL SPINE SURGERY  09/09/2017   CESAREAN SECTION  1974, 1978       COLONOSCOPY  06/19/2008   [id number] internal hemorrhoids/mild sigmoin colon diverticulosis   COLONOSCOPY  2012   Dr. Jena Gauss: normal rectum, diverticula, lymphocytic colitis    COLONOSCOPY WITH PROPOFOL N/A 04/17/2016   Dr. Darrick Penna: Normal terminal ileum, internal/external hemorrhoids, random colon biopsies consistent with collagenous colitis   DILATION AND CURETTAGE OF UTERUS  1977   spontaneous abortion   ESOPHAGOGASTRODUODENOSCOPY  06/19/2008   [id number]:[id number] gastritis   ESOPHAGOGASTRODUODENOSCOPY (EGD) WITH PROPOFOL N/A 11/12/2022   Procedure: ESOPHAGOGASTRODUODENOSCOPY (EGD) WITH PROPOFOL;  Surgeon: Lanelle Bal, DO;  Location: [facility] SU[facility];  Service: Endoscopy;  Laterality: N/A;  8:30 am, asa 3   LUMBAR DISC ARTHROPLASTY  09/1996   LUMBAR FUSION  6/03, 11/08, 11/14   L4-5 fusion, L2-3 fusion, L1-2   LUMBAR FUSION  08/03/2020   L4-L5   LUMBAR LAMINECTOMY  03/2020   MOUTH SURGERY  12/07/2021   oral surgery   ODONTOID SCREW INSERTION N/A 01/27/2018   Procedure: ODONTOID SCREW INSERTION;  Surgeon: Tressie Stalker, MD;  Location: [facility] OR;  Service: Neurosurgery;  Laterality: N/A;  ODONTOID SCREW INSERTION   TONSILLECTOMY     TONSILLECTOMY AND ADENOIDECTOMY     TUBAL LIGATION     Social History   Social History Narrative   ATTENDS [employer]. 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   Immunization History  Administered Date(s) Administered   Influenza,inj,Quad PF,6+ Mos 01/21/2017   Influenza-Unspecified 01/30/2017   Moderna Sars-Covid-2 Vaccination 06/01/2019, 07/02/2019   Pneumococcal Polysaccharide-23 02/19/2013   Tdap 09/10/2016     Objective: Vital Signs: BP 134/77 (BP Location: Left Arm, Patient Position: Sitting, Cuff Size: Normal)   Pulse 74   Resp 15   Ht 4\' 10"  (1.473 m)   Wt 122 lb (55.3 kg)   LMP 02/01/1999   BMI 25.50 kg/m    Physical Exam Vitals and nursing note reviewed.  Constitutional:      Appearance: She is well-developed.  HENT:     Head: Normocephalic and atraumatic.  Eyes:     Conjunctiva/sclera: Conjunctivae normal.  Cardiovascular:     Rate and Rhythm: Normal rate and regular rhythm.     Heart sounds: Normal heart sounds.  Pulmonary:     Effort: Pulmonary effort is normal.     Breath sounds: Normal breath sounds.  Abdominal:     General: Bowel sounds are normal.     Palpations: Abdomen is soft.  Musculoskeletal:     Cervical back: Normal range of motion.  Lymphadenopathy:     Cervical: No cervical adenopathy.  Skin:    General: Skin is warm and dry.     Capillary Refill: Capillary refill takes less than 2 seconds.  Neurological:     Mental Status: She is alert  and oriented to person, place, and time.  Psychiatric:        Behavior: Behavior normal.      Musculoskeletal Exam: C-spine has limited range of motion.  Thoracic kyphosis noted.  Midline spinal tenderness in the thoracic region.  Mild left SI joint tenderness.  Shoulder joints have good range of motion.  Elbow joints and wrist joints have good ROM.  Severe PIP and DIP thickening consistent with osteoarthritis and psoriatic arthritis overlap.  Incomplete fist formation noted.  Hip joints have slightly limited range of motion.  Tenderness over the trochanteric bursa bilaterally, right greater than left.  Knee joints have good range of motion with no warmth or effusion.  Ankle joints have good range of motion with no tenderness or joint swelling.  No evidence of Achilles tendinitis or plantar fasciitis at this time.  CDAI Exam: CDAI Score: -- Patient Global: --; Provider Global: -- Swollen: --; Tender: -- Joint Exam 11/22/2022   No joint exam has been documented for this visit   There is currently no information documented on the homunculus. Go to the Rheumatology activity and complete the homunculus joint exam.  Investigation: No additional findings.  Imaging: DG Foot Complete Right  Result Date: 11/15/2022 Please see detailed radiograph report in office note.   Recent Labs: Lab Results  Component Value Date   WBC 13.8 (H) 09/20/2022   HGB 12.1 09/20/2022   PLT 272 09/20/2022   NA 135 11/07/2022   K 3.2 (L) 11/07/2022   CL 108 11/07/2022   CO2 19 (L) 11/07/2022   GLUCOSE 160 (H) 11/07/2022   BUN 28 (H) 11/07/2022   CREATININE 0.89 11/07/2022   BILITOT 0.3 09/20/2022   ALKPHOS 70 12/21/2020   AST 28 09/20/2022   ALT 32 (H) 09/20/2022   PROT 6.4 09/20/2022   ALBUMIN 4.4 12/21/2020   CALCIUM 9.1 11/07/2022   GFRAA 65 06/08/2020   QFTBGOLD Negative 05/05/2015   QFTBGOLDPLUS NEGATIVE 09/20/2022    Speciality Comments: MTX- high LFTs, PLQ,SSZ,Imuran, Humira- allergy,  Enbrel , Remicade - inadequate response  Procedures:  Large Joint Inj: R greater trochanter on 11/22/2022 3:11 PM Indications: pain Details: 27 G 1.5 in needle, lateral approach  Arthrogram: No  Medications: 1.5 mL lidocaine 1 %; 40 mg triamcinolone acetonide 40 MG/ML Aspirate: 0 mL Outcome: tolerated well, no immediate complications Procedure, treatment alternatives, risks and benefits explained, specific risks discussed. Consent was given by the patient. Immediately prior to procedure a time out was called to verify the correct patient, procedure, equipment, support staff and site/side marked as required. Patient was prepped and draped in the usual sterile fashion.     Allergies: Adalimumab, Imuran [azathioprine sodium], Methotrexate, Plaquenil [hydroxychloroquine sulfate], Sulfonamide derivatives, Azathioprine, Ceftin [cefuroxime axetil], Cefuroxime, Lisinopril, Sulfamethoxazole-trimethoprim, and Morphine   Assessment / Plan:     Visit Diagnoses: Psoriatic arthritis [facility]): Patient has severe PIP and DIP thickening consistent with osteoarthritis and psoriatic arthritis overlap.  No active synovitis or dactylitis noted on examination today.  No evidence of Achilles tendinitis or plantar fasciitis.  Her left SI joint pain which is chronic has been tolerable overall since having a cortisone injection performed on 05/29/2022.  Patient remains on Taltz 80 mg sq injections once monthly, Arava 20 mg 1 tablet daily, Otezla 30 mg 1 tablet twice daily, and prednisone 5 mg 1 tablet by mouth daily.  She remains under the care of Dr. Berline Chough for pain management.  She has discontinued Lyrica due to no clinical benefit.  Patient presents today with acute right trochanteric bursitis and a right trochanteric bursa cortisone injection was performed today.  Patient was vies notify us if her symptoms persist or worsen.  She plans on keeping her follow-up visit scheduled  next month.  Psoriasis: No active psoriasis at  this time.  High risk medication use - Taltz 80 mg sq injections q 4 weeks, Arava 20mg  1 tablet daily, Otezla 30 mg 1 tablet by mouth twice daily, and prednisone 5 mg 1 tablet daily. She is aware of the risks of long-term prednisone use. CBC and CMP updated on 09/20/22.  Patient plans on having updated lab work at her upcoming appointment in September 2024.  Standing orders for CBC and CMP remain in place. TB gold negative on 09/20/22.  Recent or recurrent infections.  Discussed the importance of holding taltz and arava if she develops signs or symptoms of an infection and to resume once the infection has completely cleared.  Chronic left SI joint pain: Tolerable at this time.  Her last SI joint cortisone injection was performed on 05/29/2022.  Primary osteoarthritis of both knees: Good range of motion of both knee joints on examination today.  No warmth or effusion noted.  DDD (degenerative disc disease), cervical: C-spine has limited range of motion.  DDD (degenerative disc disease), thoracic: Patient continues to have chronic thoracic pain.  Inadequate response to injections in the past.  DDD (degenerative disc disease), lumbar - Followed by Dr. Lovell Sheehan and Dr. Berline Chough.  Trochanteric bursitis, right hip -Patient presents today with acute right-sided hip pain which started 1 week ago.  No recent injury or fall.  She has had difficulty walking due to severity of pain on the lateral aspect of her right hip.  Patient has requested a right trochanteric bursa cortisone injection today.  She tolerated procedure well.  Procedure note was completed above.  Aftercare was discussed.  She was advised to notify us if her symptoms persist or worsen.  Plan: Large Joint Inj: R greater trochanter  Raynaud's syndrome without gangrene: Not currently symptomatic.  No digital ulcerations noted.  Age-related osteoporosis without current pathological fracture - Followed by Dr. Talmage Nap.  She remains on Prolia 60 mg sq  injections.  Her last dose of Prolia was administered in February 2024.  Essential hypertension: Blood pressure was 134/77 today in the office.  Chronic pain syndrome: Under care of Dr. Berline Chough.  Discontinued Lyrica due to no clinical benefit.    Other medical conditions are listed as follows:   Esophageal motility disorder: EGD last week 11/12/22-dilation performed. Gastritis detected.   History of type 2 diabetes mellitus  Adrenal insufficiency (HCC)  History of gastroesophageal reflux (GERD)  Collagenous colitis  Senile purpura (HCC)  Pedal edema    Orders: Orders Placed This Encounter  Procedures   Large Joint Inj: R greater trochanter   No orders of the defined types were placed in this encounter.     Follow-Up Instructions: Return for Psoriatic arthritis.   Gearldine Bienenstock, PA-C  Note - This record has been created using Dragon software.  Chart creation errors have been sought, but may not always  have been located. Such creation errors do not reflect on  the standard of medical care.

## 2022-11-20 MED ORDER — HYDROCODONE-ACETAMINOPHEN 5-325 MG PO TABS: 1.0000 | ORAL_TABLET | Freq: Two times a day (BID) | ORAL | 0 refills | Status: DC | PRN

## 2022-11-20 NOTE — Telephone Encounter (Signed)
Patient aware.

## 2022-11-22 ENCOUNTER — Encounter: Payer: PPO | Admitting: Physician Assistant

## 2022-11-22 ENCOUNTER — Encounter: Payer: Self-pay | Admitting: Physician Assistant

## 2022-11-22 VITALS — BP 134/77 | HR 74 | Resp 15 | Ht <= 58 in | Wt 122.0 lb

## 2022-11-22 DIAGNOSIS — I10 Essential (primary) hypertension: Secondary | ICD-10-CM | POA: Diagnosis not present

## 2022-11-22 DIAGNOSIS — M7061 Trochanteric bursitis, right hip: Secondary | ICD-10-CM | POA: Diagnosis not present

## 2022-11-22 DIAGNOSIS — G8929 Other chronic pain: Secondary | ICD-10-CM

## 2022-11-22 DIAGNOSIS — M810 Age-related osteoporosis without current pathological fracture: Secondary | ICD-10-CM

## 2022-11-22 DIAGNOSIS — L409 Psoriasis, unspecified: Secondary | ICD-10-CM | POA: Diagnosis not present

## 2022-11-22 DIAGNOSIS — Z79899 Other long term (current) drug therapy: Secondary | ICD-10-CM | POA: Diagnosis not present

## 2022-11-22 DIAGNOSIS — G894 Chronic pain syndrome: Secondary | ICD-10-CM

## 2022-11-22 DIAGNOSIS — R600 Localized edema: Secondary | ICD-10-CM

## 2022-11-22 DIAGNOSIS — M533 Sacrococcygeal disorders, not elsewhere classified: Secondary | ICD-10-CM

## 2022-11-22 DIAGNOSIS — E2740 Unspecified adrenocortical insufficiency: Secondary | ICD-10-CM

## 2022-11-22 DIAGNOSIS — Z8719 Personal history of other diseases of the digestive system: Secondary | ICD-10-CM

## 2022-11-22 DIAGNOSIS — M5134 Other intervertebral disc degeneration, thoracic region: Secondary | ICD-10-CM

## 2022-11-22 DIAGNOSIS — M5030 Other cervical disc degeneration, unspecified cervical region: Secondary | ICD-10-CM | POA: Diagnosis not present

## 2022-11-22 DIAGNOSIS — D692 Other nonthrombocytopenic purpura: Secondary | ICD-10-CM

## 2022-11-22 DIAGNOSIS — M170 Bilateral primary osteoarthritis of knee: Secondary | ICD-10-CM | POA: Diagnosis not present

## 2022-11-22 DIAGNOSIS — M5136 Other intervertebral disc degeneration, lumbar region: Secondary | ICD-10-CM | POA: Diagnosis not present

## 2022-11-22 DIAGNOSIS — K52831 Collagenous colitis: Secondary | ICD-10-CM

## 2022-11-22 DIAGNOSIS — L4050 Arthropathic psoriasis, unspecified: Secondary | ICD-10-CM

## 2022-11-22 DIAGNOSIS — I7300 Raynaud's syndrome without gangrene: Secondary | ICD-10-CM

## 2022-11-22 DIAGNOSIS — Z8639 Personal history of other endocrine, nutritional and metabolic disease: Secondary | ICD-10-CM

## 2022-11-22 DIAGNOSIS — M7062 Trochanteric bursitis, left hip: Secondary | ICD-10-CM

## 2022-11-22 DIAGNOSIS — K224 Dyskinesia of esophagus: Secondary | ICD-10-CM

## 2022-11-22 DIAGNOSIS — M51369 Other intervertebral disc degeneration, lumbar region without mention of lumbar back pain or lower extremity pain: Secondary | ICD-10-CM

## 2022-11-22 MED ORDER — LIDOCAINE HCL 1 % IJ SOLN
1.5000 mL | INTRAMUSCULAR | Status: AC | PRN
Start: 2022-11-22 — End: 2022-11-22
  Administered 2022-11-22: 1.5 mL

## 2022-11-22 MED ORDER — TRIAMCINOLONE ACETONIDE 40 MG/ML IJ SUSP
40.0000 mg | INTRAMUSCULAR | Status: AC | PRN
Start: 2022-11-22 — End: 2022-11-22
  Administered 2022-11-22: 40 mg via INTRA_ARTICULAR

## 2022-11-22 NOTE — Patient Instructions (Signed)
Standing Labs We placed an order today for your standing lab work.   Please have your standing labs drawn in September and every 3 months   Please have your labs drawn 2 weeks prior to your appointment so that the provider can discuss your lab results at your appointment, if possible.  Please note that you may see your imaging and lab results in MyChart before we have reviewed them. We will contact you once all results are reviewed. Please allow our office up to 72 hours to thoroughly review all of the results before contacting the office for clarification of your results.  WALK-IN LAB HOURS  Monday through Thursday from 8:00 am -12:30 pm and 1:00 pm-5:00 pm and Friday from 8:00 am-12:00 pm.  Patients with office visits requiring labs will be seen before walk-in labs.  You may encounter longer than normal wait times. Please allow additional time. Wait times may be shorter on  Monday and Thursday afternoons.  We do not book appointments for walk-in labs. We appreciate your patience and understanding with our staff.   Labs are drawn by [facility]. Please bring your co-pay at the time of your lab draw.  You may receive a bill from [facility] for your lab work.  Please note if you are on Hydroxychloroquine and and an order has been placed for a Hydroxychloroquine level,  you will need to have it drawn 4 hours or more after your last dose.  If you wish to have your labs drawn at another location, please call the office 24 hours in advance so we can fax the orders.  The office is located at [address], [address], [place], [place] [address]   If you have any questions regarding directions or hours of operation,  please call [phone].   As a reminder, please drink plenty of water prior to coming for your lab work. Thanks!  

## 2022-11-28 ENCOUNTER — Other Ambulatory Visit (HOSPITAL_BASED_OUTPATIENT_CLINIC_OR_DEPARTMENT_OTHER): Payer: Self-pay | Admitting: Obstetrics & Gynecology

## 2022-11-28 DIAGNOSIS — M5414 Radiculopathy, thoracic region: Secondary | ICD-10-CM | POA: Diagnosis not present

## 2022-12-05 ENCOUNTER — Other Ambulatory Visit: Payer: Self-pay | Admitting: *Deleted

## 2022-12-05 DIAGNOSIS — L4050 Arthropathic psoriasis, unspecified: Secondary | ICD-10-CM

## 2022-12-05 DIAGNOSIS — L409 Psoriasis, unspecified: Secondary | ICD-10-CM

## 2022-12-05 DIAGNOSIS — Z79899 Other long term (current) drug therapy: Secondary | ICD-10-CM

## 2022-12-05 MED ORDER — TALTZ 80 MG/ML ~~LOC~~ SOAJ
80.0000 mg | SUBCUTANEOUS | 0 refills | Status: DC
Start: 2022-12-05 — End: 2023-03-29

## 2022-12-05 NOTE — Telephone Encounter (Signed)
Refill request received via fax from [facility] for Altamease Oiler  Last Fill: 06/19/2022  Labs: 06/20/2022 CBC stable. ALT is borderline elevated-32. Rest of CMP stable.   TB Gold: 09/20/2022 Neg   Next Visit: 12/20/2022  Last Visit: 11/22/2022  DX: Psoriatic arthritis   Current Dose per office note 09/20/2022: Taltz 80 mg sq injections q 4 weeks   Okay to refill Taltz?

## 2022-12-06 ENCOUNTER — Other Ambulatory Visit: Payer: Self-pay | Admitting: Physician Assistant

## 2022-12-06 ENCOUNTER — Other Ambulatory Visit: Payer: Self-pay | Admitting: Rheumatology

## 2022-12-06 DIAGNOSIS — L409 Psoriasis, unspecified: Secondary | ICD-10-CM

## 2022-12-06 MED ORDER — OTEZLA 30 MG PO TABS
30.0000 mg | ORAL_TABLET | Freq: Two times a day (BID) | ORAL | 0 refills | Status: DC
Start: 2022-12-06 — End: 2023-04-10

## 2022-12-06 NOTE — Telephone Encounter (Signed)
Last Fill: 09/03/2022  Next Visit: 12/20/2022  Last Visit: 11/22/2022  Current Dose per office note on 11/22/2022: not discussed   Okay to refill Topamax?

## 2022-12-06 NOTE — Telephone Encounter (Signed)
Last Fill: 08/13/2022  Labs: 09/20/2022 ALT is borderline elevated-32. Rest of CMP stable CBC stable.   Next Visit: 12/20/2022  Last Visit: 11/22/2022  DX: Psoriatic arthritis   Current Dose per office note 11/22/2022: Otezla 30 mg 1 tablet by mouth twice daily   Okay to refill [place]?

## 2022-12-06 NOTE — Telephone Encounter (Signed)
Patient contacted the office to request a medication refill.   1. Name of Medication: Otezla  2. How are you currently taking this medication (dosage and times per day)? 1 tablet / 2 times/day   3. What pharmacy would you like for that to be sent to? [facility]

## 2022-12-10 ENCOUNTER — Ambulatory Visit (INDEPENDENT_AMBULATORY_CARE_PROVIDER_SITE_OTHER): Payer: PPO

## 2022-12-10 ENCOUNTER — Ambulatory Visit: Payer: PPO | Admitting: Podiatry

## 2022-12-10 ENCOUNTER — Encounter: Payer: Self-pay | Admitting: Podiatry

## 2022-12-10 DIAGNOSIS — L84 Corns and callosities: Secondary | ICD-10-CM | POA: Diagnosis not present

## 2022-12-10 DIAGNOSIS — M7751 Other enthesopathy of right foot: Secondary | ICD-10-CM | POA: Diagnosis not present

## 2022-12-10 DIAGNOSIS — E1140 Type 2 diabetes mellitus with diabetic neuropathy, unspecified: Secondary | ICD-10-CM

## 2022-12-10 DIAGNOSIS — E1149 Type 2 diabetes mellitus with other diabetic neurological complication: Secondary | ICD-10-CM | POA: Diagnosis not present

## 2022-12-10 DIAGNOSIS — M79671 Pain in right foot: Secondary | ICD-10-CM | POA: Diagnosis not present

## 2022-12-10 MED ORDER — TRIAMCINOLONE ACETONIDE 10 MG/ML IJ SUSP
10.0000 mg | Freq: Once | INTRAMUSCULAR | Status: AC
Start: 2022-12-10 — End: 2022-12-10
  Administered 2022-12-10: 10 mg via INTRA_ARTICULAR

## 2022-12-11 NOTE — Progress Notes (Signed)
Subjective:   Patient ID: Tonya Hall, female   DOB: 72 y.o.   MRN: [id number]   HPI Patient presents with a lot of pain around the lesser MPJ right third joint and lesions plantar right they can very tender and make it hard to walk.  States these have been going on for a while   ROS      Objective:  Physical Exam  Neurovascular status intact with inflammation fluid buildup around the third MPJ right and lesion formation of the plantar right foot proximal to this area x 2 that are painful when pressed with patient on blood thinner     Assessment:  At risk patient with chronic lesion x 2 right and inflamed capsule right     Plan:  H&P reviewed condition and went ahead today and did a sterile prep and injected the capsule right 3 mg dexamethasone Kenalog 5 mg Xylocaine and then did sterile sharp debridement of lesions right no iatrogenic bleeding reappoint routine care

## 2022-12-11 NOTE — Progress Notes (Signed)
Office Visit Note  Patient: Tonya Hall             Date of Birth: 11-Jul-1950           MRN: [id number]             PCP: Carylon Perches, MD Referring: Carylon Perches, MD Visit Date: 12/20/2022 Occupation: @GUAROCC @  Subjective:  Right fourth finger pain  History of Present Illness: Tonya Hall is a 72 y.o. female with psoriatic arthritis, osteoarthritis and degenerative disc disease.  She has been having severe pain and discomfort in her left fourth MCP joint for the last 2 weeks.  She states the pain has been severe that she has been holding her hand.  She states the pain has been better over the last couple of days.  She continues to have pain and discomfort in the bilateral thumbs.  She is on Taltz 80 mg subcu every 4 weeks and leflunomide 20 mg p.o. daily and Otezla 30 mg p.o. twice daily along with prednisone 5 mg p.o. daily.  She continues to go to Dr. Berline Chough for pain management.She had thoracic spine injection at [facility] a couple of weeks ago which did not help much.  Right trochanteric bursitis is better after the cortisone injection.  She is also having pain and discomfort in her right foot metatarsals.  She states she had recent cortisone injections by Dr. Charlsie Merles which helped.    Activities of Daily Living:  Patient reports morning stiffness for all day. Patient Reports nocturnal pain.  Difficulty dressing/grooming: Reports Difficulty climbing stairs: Reports Difficulty getting out of chair: Reports Difficulty using hands for taps, buttons, cutlery, and/or writing: Reports  Review of Systems  Constitutional:  Positive for fatigue.  HENT:  Positive for mouth dryness. Negative for mouth sores.   Eyes:  Positive for dryness.  Respiratory:  Negative for shortness of breath.   Cardiovascular:  Negative for chest pain and palpitations.  Gastrointestinal:  Negative for blood in stool, constipation and diarrhea.  Endocrine: Negative for increased urination.   Genitourinary:  Negative for involuntary urination.  Musculoskeletal:  Positive for joint pain, gait problem, joint pain, joint swelling, myalgias, muscle weakness, morning stiffness, muscle tenderness and myalgias.  Skin:  Positive for color change. Negative for rash, hair loss and sensitivity to sunlight.  Allergic/Immunologic: Negative for susceptible to infections.  Neurological:  Negative for dizziness and headaches.  Hematological:  Positive for swollen glands.  Psychiatric/Behavioral:  Positive for sleep disturbance. Negative for depressed mood. The patient is not nervous/anxious.     PMFS History:  Patient Active Problem List   Diagnosis Date Noted   Statin not tolerated 10/23/2021   Chronic allergic conjunctivitis 07/29/2021   Vasomotor rhinitis 07/29/2021   Benign neoplasm of skin of lower limb 05/17/2021   History of neoplasm 05/17/2021   Melanocytic nevi of left upper limb, including shoulder 05/17/2021   Melanocytic nevi of trunk 05/17/2021   Other seborrheic keratosis 05/17/2021   Diarrhea 03/10/2021   Fracture of pelvis (HCC) 01/27/2021   Pain of left hip joint 01/06/2021   Foraminal stenosis of lumbar region 08/03/2020   Cervical spondylosis with myelopathy and radiculopathy 03/09/2020   AKI (acute kidney injury) (HCC) 02/07/2019   Acute respiratory failure with hypoxia (HCC) 02/06/2019   Acute respiratory disease due to COVID-19 virus 02/06/2019   Community acquired pneumonia 02/06/2019   Hyponatremia 02/06/2019   Asthma    Diabetes mellitus without complication (HCC)    Closed nondisplaced odontoid fracture  with type II morphology (HCC) 01/27/2018   Odontoid fracture (HCC) 01/24/2018   Chest pain 11/14/2017   Transaminitis 06/22/2016   Collagenous colitis 05/23/2016   Psoriasis 05/21/2016   High risk medication use 02/15/2016   Age-related osteoporosis without current pathological fracture 02/15/2016   Trochanteric bursitis, left hip 02/15/2016    Sacroiliitis, not elsewhere classified (HCC) 02/15/2016   Chronic pain syndrome 02/15/2016   Abnormal weight gain 06/25/2012   Hypertension 07/03/2011   Hypercholesteremia 07/03/2011   Psoriatic arthritis (HCC) 07/03/2011   Osteoarthritis of multiple joints 07/03/2011   Esophageal motility disorder 02/14/2011   Cough 02/14/2011   GERD 05/05/2010    Past Medical History:  Diagnosis Date   Asthma    Albuterol in haler prn   Cataract    immature unsure which eye   Chronic back pain    COVID-19 October/November 2020   Degenerative disk disease    psoriatic   Diabetes mellitus without complication (HCC)    diet controlled   Esophageal motility disorder    Non-specific, see modified barium study/speech path, BP   Fibromyalgia    GERD (gastroesophageal reflux disease)    takes Protonix bid   Headache 2019   History of blood transfusion    post c-section   History of bronchitis    HTN (hypertension)    Hyperlipidemia    takes Pravastatin daily   Lymphocytic colitis 05/26/2010   Responded to Entocort x 3 MOS   Neuropathy    Nonallergic rhinitis    Osteoporosis    gets Boniva every 3 months   Peripheral edema    takes HCTZ daily   Pneumonia 02/2019   Psoriatic arthritis (HCC)    Dr. Anthonette Legato   Psoriatic arthritis (HCC)    bilateral hands   Raynaud's disease    Seasonal allergies    Shingles 2020   SUI (stress urinary incontinence, female)    Urinary urgency     Family History  Problem Relation Age of Onset   Diabetes Mother    Pancreatitis Mother    Arthritis Mother        psoriatic    Fibromyalgia Mother    Rheumatic fever Father    Diabetes Other        grandparents   Skin cancer Other        grandfather   Hypertension Other        grandparent   Congestive Heart Failure Other        grandfather   Parkinson's disease Other        grandmother   Colon cancer Maternal Uncle    Fibromyalgia Sister    Rheum arthritis Sister    Diabetes Sister     Fibromyalgia Sister    Diabetes Sister    Rheum arthritis Sister    Hypertension Son    Colon polyps Neg Hx    Past Surgical History:  Procedure Laterality Date   ANTERIOR CERVICAL DECOMP/DISCECTOMY FUSION N/A 03/09/2020   Procedure: ANTERIOR CERVICAL DECOMPRESSION/DISCECTOMY FUSION, INTERBODY PROSTHESIS, PLATE SCREWS CERVICAL FIVE-SIX, CERVICAL SIX-SEVEN;  Surgeon: Tressie Stalker, MD;  Location: [facility] OR;  Service: Neurosurgery;  Laterality: N/A;   BACK SURGERY  2003   BALLOON DILATION N/A 11/12/2022   Procedure: BALLOON DILATION;  Surgeon: Lanelle Bal, DO;  Location: [facility];  Service: Endoscopy;  Laterality: N/A;   BIOPSY  04/17/2016   Procedure: BIOPSY;  Surgeon: West Bali, MD;  Location: [facility];  Service: Endoscopy;;  random colon bx's  BIOPSY  11/12/2022   Procedure: BIOPSY;  Surgeon: Lanelle Bal, DO;  Location: [facility];  Service: Endoscopy;;   CERVICAL SPINE SURGERY  09/09/2017   CESAREAN SECTION  1974, 1978       COLONOSCOPY  06/19/2008   VWU:[id number] internal hemorrhoids/mild sigmoin colon diverticulosis   COLONOSCOPY  2012   Dr. Jena Gauss: normal rectum, diverticula, lymphocytic colitis    COLONOSCOPY WITH PROPOFOL N/A 04/17/2016   Dr. Darrick Penna: Normal terminal ileum, internal/external hemorrhoids, random colon biopsies consistent with collagenous colitis   DILATION AND CURETTAGE OF UTERUS  1977   spontaneous abortion   ESOPHAGOGASTRODUODENOSCOPY  06/19/2008   JYN:WGNFAOZH gastritis   ESOPHAGOGASTRODUODENOSCOPY (EGD) WITH PROPOFOL N/A 11/12/2022   Procedure: ESOPHAGOGASTRODUODENOSCOPY (EGD) WITH PROPOFOL;  Surgeon: Lanelle Bal, DO;  Location: [facility];  Service: Endoscopy;  Laterality: N/A;  8:30 am, asa 3   LUMBAR DISC ARTHROPLASTY  09/1996   LUMBAR FUSION  6/03, 11/08, 11/14   L4-5 fusion, L2-3 fusion, L1-2   LUMBAR FUSION  08/03/2020   L4-L5   LUMBAR LAMINECTOMY  03/2020   MOUTH SURGERY  12/07/2021   oral surgery   ODONTOID SCREW  INSERTION N/A 01/27/2018   Procedure: ODONTOID SCREW INSERTION;  Surgeon: Tressie Stalker, MD;  Location: [facility] OR;  Service: Neurosurgery;  Laterality: N/A;  ODONTOID SCREW INSERTION   TONSILLECTOMY     TONSILLECTOMY AND ADENOIDECTOMY     TUBAL LIGATION     Social History   Social History Narrative   ATTENDS [employer]. 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   Immunization History  Administered Date(s) Administered   Influenza,inj,Quad PF,6+ Mos 01/21/2017   Influenza-Unspecified 01/30/2017   Moderna Sars-Covid-2 Vaccination 06/01/2019, 07/02/2019   Pneumococcal Polysaccharide-23 02/19/2013   Tdap 09/10/2016     Objective: Vital Signs: BP (!) 163/79 (BP Location: Right Arm, Patient Position: Sitting, Cuff Size: Normal)   Pulse 81   Resp 14   Ht 4\' 10"  (1.473 m)   Wt 122 lb (55.3 kg)   LMP 02/01/1999   BMI 25.50 kg/m    Physical Exam Vitals and nursing note reviewed.  Constitutional:      Appearance: She is well-developed.  HENT:     Head: Normocephalic and atraumatic.  Eyes:     Conjunctiva/sclera: Conjunctivae normal.  Cardiovascular:     Rate and Rhythm: Normal rate and regular rhythm.     Heart sounds: Normal heart sounds.  Pulmonary:     Effort: Pulmonary effort is normal.     Breath sounds: Normal breath sounds.  Abdominal:     General: Bowel sounds are normal.     Palpations: Abdomen is soft.  Musculoskeletal:     Cervical back: Normal range of motion.  Lymphadenopathy:     Cervical: No cervical adenopathy.  Skin:    General: Skin is warm and dry.     Capillary Refill: Capillary refill takes less than 2 seconds.  Neurological:     Mental Status: She is alert and oriented to person, place, and time.  Psychiatric:        Behavior: Behavior normal.      Musculoskeletal Exam: She had limited lateral rotation, flexion and extension of the cervical spine.  Severe thoracic kyphosis was noted.  She had tenderness in the mid thoracic and lumbar region.  She  had SI joint tenderness.  Shoulder joints and elbow joints were in good range of motion.  There was no synovitis over bilateral wrist joints which were in good range of motion.  Bilateral second and third MCP thickening with no synovitis was noted.  There was no tenderness over MCP joints.  She had bilateral PIP and DIP thickening with no synovitis.  Subluxation of most of the PIP and DIP joints was noted.  She had tenderness over her left ring finger flexor tendon.  Hip joints and knee joints were in good range of motion.  There was no tenderness over ankles or MTPs.  There was no plantar fasciitis or Achilles tendinitis.  CDAI Exam: CDAI Score: -- Patient Global: --; Provider Global: -- Swollen: --; Tender: -- Joint Exam 12/20/2022   No joint exam has been documented for this visit   There is currently no information documented on the homunculus. Go to the Rheumatology activity and complete the homunculus joint exam.  Investigation: No additional findings.  Imaging: DG Foot 2 Views Right  Result Date: 12/10/2022 Please see detailed radiograph report in office note.   Recent Labs: Lab Results  Component Value Date   WBC 13.8 (H) 09/20/2022   HGB 12.1 09/20/2022   PLT 272 09/20/2022   NA 135 11/07/2022   K 3.2 (L) 11/07/2022   CL 108 11/07/2022   CO2 19 (L) 11/07/2022   GLUCOSE 160 (H) 11/07/2022   BUN 28 (H) 11/07/2022   CREATININE 0.89 11/07/2022   BILITOT 0.3 09/20/2022   ALKPHOS 70 12/21/2020   AST 28 09/20/2022   ALT 32 (H) 09/20/2022   PROT 6.4 09/20/2022   ALBUMIN 4.4 12/21/2020   CALCIUM 9.1 11/07/2022   GFRAA 65 06/08/2020   QFTBGOLD Negative 05/05/2015   QFTBGOLDPLUS NEGATIVE 09/20/2022    Speciality Comments: MTX- high LFTs, PLQ,SSZ,Imuran, Humira- allergy, Enbrel , Remicade - inadequate response  Procedures:  No procedures performed Allergies: Adalimumab, Imuran [azathioprine sodium], Methotrexate, Plaquenil [hydroxychloroquine sulfate], Sulfonamide  derivatives, Azathioprine, Ceftin [cefuroxime axetil], Cefuroxime, Lisinopril, Sulfamethoxazole-trimethoprim, and Morphine   Assessment / Plan:     Visit Diagnoses: Psoriatic arthritis (HCC)-she continues to have pain and discomfort in multiple joints.  No synovitis was noted on the examination.  She appears to be doing well on the current combination of medications.  She has been going to pain management which has been helpful in reducing pain level.  Psoriasis-no active psoriasis lesions were noted.  High risk medication use - Taltz 80 mg sq injections q 4 weeks, Arava 20mg  1 tablet daily, Otezla 30 mg 1 tablet by mouth twice daily, and prednisone 5 mg 1 tablet daily. -Labs obtained on September 19, 2021 elevated white cell count was noted most likely due to chronic prednisone use.  LFTs were mildly elevated.  We will recheck labs today.  Plan: CBC with Differential/Platelet, COMPLETE METABOLIC PANEL WITH GFR.  She was advised to get labs every 3 months to monitor for drug toxicity.  Information on immunization was placed in the AVS.  She was advised to hold Taltz and leflunomide if she develops an infection resume after the infection resolves.  Flexor tenosynovitis of finger - Left ring finger.  Patient has been complaining about severe pain and discomfort in her left ring finger.  No synovitis was noted over the PIP or MCP joint.  The tenderness was mostly over left flexor tendon.  She is not having any triggering of the finger.  She states the pain has eased off over the last 2 days.  Use of topical Voltaren or Aspercreme was advised.  I resting her hand was also advised.  Patient states she has been doing a lot of cleaning and  lifting which could have triggered her finger.  I advised her to contact me if her symptoms get worse or if finger starts triggering.  Chronic left SI joint pain - Her last SI joint cortisone injection was performed on 05/29/2022.  She states the pain is manageable  currently.  Primary osteoarthritis of both knees-she has chronic discomfort.  No warmth swelling or effusion was noted.  DDD (degenerative disc disease), cervical-she continue to have limited range of motion.  DDD (degenerative disc disease), thoracic-she had thoracic spine injection about 2 weeks ago at [facility].  She states she did not have much relief from the injection.  DDD (degenerative disc disease), lumbar -she continues to have lower back pain.  She is followed by Dr. Lovell Sheehan and Dr. Berline Chough.  Trochanteric bursitis, right hip - s/p cortisone injection on 11/22/2022.  She had some relief from the trochanteric bursa injection.  Raynaud's syndrome without gangrene-keeping cord pressure warm was discussed.  Age-related osteoporosis without current pathological fracture - Followed by Dr. Talmage Nap.  She remains on Prolia 60 mg sq injections.  Her last dose of Prolia was administered in February 2024.  Calcium rich diet with vitamin D was advised.  Essential hypertension-blood pressure was elevated at 164/79.  Repeat blood pressure was 163/79.  She was advised to monitor blood pressure closely and follow-up with the PCP.  Chronic pain syndrome - Under care of Dr. Berline Chough.  Discontinued Lyrica due to no clinical benefit.  Esophageal motility disorder - EGD last week 11/12/22-dilation performed. Gastritis detected.  History of type 2 diabetes mellitus  Adrenal insufficiency (HCC)  History of gastroesophageal reflux (GERD)  Senile purpura (HCC)  Pedal edema  Collagenous colitis  Orders: Orders Placed This Encounter  Procedures   CBC with Differential/Platelet   COMPLETE METABOLIC PANEL WITH GFR   No orders of the defined types were placed in this encounter.    Follow-Up Instructions: Return in about 5 months (around 05/22/2023) for Psoriatic arthritis, Osteoarthritis.   Tonya Savoy, MD  Note - This record has been created using Animal nutritionist.  Chart  creation errors have been sought, but may not always  have been located. Such creation errors do not reflect on  the standard of medical care.

## 2022-12-13 ENCOUNTER — Encounter (HOSPITAL_BASED_OUTPATIENT_CLINIC_OR_DEPARTMENT_OTHER): Payer: Self-pay | Admitting: Obstetrics & Gynecology

## 2022-12-13 ENCOUNTER — Other Ambulatory Visit (HOSPITAL_COMMUNITY)
Admission: RE | Admit: 2022-12-13 | Discharge: 2022-12-13 | Disposition: A | Discharge status: Home / Self Care | Payer: PPO | Source: Ambulatory Visit | Attending: Obstetrics & Gynecology | Admitting: Obstetrics & Gynecology

## 2022-12-13 ENCOUNTER — Ambulatory Visit (HOSPITAL_BASED_OUTPATIENT_CLINIC_OR_DEPARTMENT_OTHER): Payer: PPO | Admitting: Obstetrics & Gynecology

## 2022-12-13 VITALS — BP 158/62 | HR 70 | Ht <= 58 in | Wt 122.0 lb

## 2022-12-13 DIAGNOSIS — Z01419 Encounter for gynecological examination (general) (routine) without abnormal findings: Secondary | ICD-10-CM

## 2022-12-13 DIAGNOSIS — D849 Immunodeficiency, unspecified: Secondary | ICD-10-CM | POA: Diagnosis not present

## 2022-12-13 DIAGNOSIS — Z124 Encounter for screening for malignant neoplasm of cervix: Secondary | ICD-10-CM

## 2022-12-13 DIAGNOSIS — Z780 Asymptomatic menopausal state: Secondary | ICD-10-CM | POA: Diagnosis not present

## 2022-12-13 DIAGNOSIS — M5414 Radiculopathy, thoracic region: Secondary | ICD-10-CM | POA: Diagnosis not present

## 2022-12-13 DIAGNOSIS — N3946 Mixed incontinence: Secondary | ICD-10-CM

## 2022-12-13 DIAGNOSIS — M810 Age-related osteoporosis without current pathological fracture: Secondary | ICD-10-CM

## 2022-12-13 MED ORDER — OXYBUTYNIN CHLORIDE ER 5 MG PO TB24
5.0000 mg | ORAL_TABLET | Freq: Every day | ORAL | 3 refills | Status: DC
Start: 2022-12-13 — End: 2024-02-07

## 2022-12-13 NOTE — Progress Notes (Signed)
72 y.o. [id number] Married White or Caucasian female here for annual exam.  H/o pelvic fracture two years ago.  Her dog jumped on her and lost her balance and she had to sit down.  It wasn't a hard fall but started having pain afterwards.  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  Followed closely by oral surgeon.  Off medications.  Is going to transition to [employer].  This will be managed by Dr. Talmage Nap.    Denies vaginal bleeding.    Patient's last menstrual period was 02/01/1999.          Sexually active: No.  The current method of family planning is post menopausal status.    Smoker:  no  Health Maintenance: Pap:  12/12/20 History of abnormal Pap:  no MMG:  09/13/2022 Colonoscopy:  04/17/16, follow up 10 years BMD:   done with Dr. Talmage Nap in her office Screening Labs: does with Dr. Talmage Nap and Dr. Rennis Golden   reports that she has never smoked. She has never been exposed to tobacco smoke. She has never used smokeless tobacco. She reports that she does not drink alcohol and does not use drugs.  Past Medical History:  Diagnosis Date   Asthma    Albuterol in haler prn   Cataract    immature unsure which eye   Chronic back pain    COVID-19 October/November 2020   Degenerative disk disease    psoriatic   Diabetes mellitus without complication (HCC)    diet controlled   Esophageal motility disorder    Non-specific, see modified barium study/speech path, BP   Fibromyalgia    GERD (gastroesophageal reflux disease)    takes Protonix bid   Headache 2019   History of blood transfusion    post c-section   History of bronchitis    HTN (hypertension)    Hyperlipidemia    takes Pravastatin daily   Lymphocytic colitis 05/26/2010   Responded to Entocort x 3 MOS   Neuropathy    Nonallergic rhinitis    Osteoporosis    gets Boniva every 3 months   Peripheral edema    takes HCTZ daily   Pneumonia 02/2019   Psoriatic arthritis (HCC)    Dr. Anthonette Legato   Psoriatic arthritis [facility])     bilateral hands   Raynaud's disease    Seasonal allergies    Shingles 2020   SUI (stress urinary incontinence, female)    Urinary urgency     Past Surgical History:  Procedure Laterality Date   ANTERIOR CERVICAL DECOMP/DISCECTOMY FUSION N/A 03/09/2020   Procedure: ANTERIOR CERVICAL DECOMPRESSION/DISCECTOMY FUSION, INTERBODY PROSTHESIS, PLATE SCREWS CERVICAL FIVE-SIX, CERVICAL SIX-SEVEN;  Surgeon: Tressie Stalker, MD;  Location: [facility] OR;  Service: Neurosurgery;  Laterality: N/A;   BACK SURGERY  2003   BALLOON DILATION N/A 11/12/2022   Procedure: BALLOON DILATION;  Surgeon: Lanelle Bal, DO;  [facility];  Service: Endoscopy;  Laterality: N/A;   BIOPSY  04/17/2016   Procedure: BIOPSY;  Surgeon: West Bali, MD;  [facility];  Service: Endoscopy;;  random colon bx's   BIOPSY  11/12/2022   Procedure: BIOPSY;  Surgeon: Lanelle Bal, DO;  [facility];  Service: Endoscopy;;   CERVICAL SPINE SURGERY  09/09/2017   CESAREAN SECTION  1974, 1978       COLONOSCOPY  06/19/2008   [id number] internal hemorrhoids/mild sigmoin colon diverticulosis   COLONOSCOPY  2012   Dr. Jena Gauss: normal rectum, diverticula, lymphocytic colitis  COLONOSCOPY WITH PROPOFOL N/A 04/17/2016   Dr. Darrick Penna: Normal terminal ileum, internal/external hemorrhoids, random colon biopsies consistent with collagenous colitis   DILATION AND CURETTAGE OF UTERUS  1977   spontaneous abortion   ESOPHAGOGASTRODUODENOSCOPY  06/19/2008   [id number] gastritis   ESOPHAGOGASTRODUODENOSCOPY (EGD) WITH PROPOFOL N/A 11/12/2022   Procedure: ESOPHAGOGASTRODUODENOSCOPY (EGD) WITH PROPOFOL;  Surgeon: Lanelle Bal, DO;  [facility];  Service: Endoscopy;  Laterality: N/A;  8:30 am, asa 3   LUMBAR DISC ARTHROPLASTY  09/1996   LUMBAR FUSION  6/03, 11/08, 11/14   L4-5 fusion, L2-3 fusion, L1-2   LUMBAR FUSION  08/03/2020   L4-L5   LUMBAR LAMINECTOMY  03/2020   MOUTH SURGERY  12/07/2021    oral surgery   ODONTOID SCREW INSERTION N/A 01/27/2018   Procedure: ODONTOID SCREW INSERTION;  Surgeon: Tressie Stalker, MD;  Location: [facility] OR;  Service: Neurosurgery;  Laterality: N/A;  ODONTOID SCREW INSERTION   TONSILLECTOMY     TONSILLECTOMY AND ADENOIDECTOMY     TUBAL LIGATION      Current Outpatient Medications  Medication Sig Dispense Refill   albuterol (PROVENTIL HFA;VENTOLIN HFA) 108 (90 BASE) MCG/ACT inhaler Inhale 2 puffs into the lungs every 6 (six) hours as needed for wheezing or shortness of breath.      Apremilast (OTEZLA) 30 MG TABS Take 1 tablet (30 mg total) by mouth 2 (two) times daily. 180 tablet 0   aspirin 81 MG tablet Take 81 mg by mouth daily.     Blood Glucose Monitoring Suppl (ONETOUCH VERIO FLEX SYSTEM) w/Device KIT      cetirizine (ZYRTEC) 10 MG tablet Take 10 mg by mouth daily as needed (allergies).     chlorhexidine (PERIDEX) 0.12 % solution Use as directed 15 mLs in the mouth or throat at bedtime.     fish oil-omega-3 fatty acids 1000 MG capsule Take 2 capsules (2 g total) by mouth daily. 2-4 GRAMS DAILY (Patient taking differently: Take 1 g by mouth 2 (two) times daily. 2-4 GRAMS DAILY)     gabapentin (NEURONTIN) 600 MG tablet Take 1 tablet (600 mg total) by mouth 3 (three) times daily. To take with lyrica- lower doses on both, so has less side effects- for nerve pain 90 tablet 5   Ginger, Zingiber officinalis, (GINGER ROOT) 500 MG CAPS Take 500 mg by mouth daily.     Glucosamine Sulfate 1000 MG CAPS Take 2,000 mg by mouth daily.     HYDROcodone-acetaminophen (NORCO/VICODIN) 5-325 MG tablet Take 1 tablet by mouth 2 (two) times daily as needed. 60 tablet 0   hydrocortisone sodium succinate (SOLU-CORTEF) 100 MG SOLR injection Inject 100 mg into the muscle daily as needed (when unable to keep down prednisone tablet).      Ixekizumab (TALTZ) 80 MG/ML SOAJ Inject 80 mg into the skin every 28 (twenty-eight) days. 3 mL 0   Lancets (ONETOUCH DELICA PLUS LANCET33G)  MISC USE TO TEST 3 TIMESUDAILY.     leflunomide (ARAVA) 20 MG tablet TAKE 1 TABLET BY MOUTH ONCE DAILY. 90 tablet 0   losartan (COZAAR) 100 MG tablet Take 100 mg by mouth daily.     metoprolol succinate (TOPROL-XL) 50 MG 24 hr tablet Take 1 tablet (50 mg total) by mouth daily. Take with or immediately following a meal.     Misc Natural Products (TART CHERRY ADVANCED PO) Take 1 tablet by mouth daily. daily     Multiple Vitamin (MULTIVITAMIN) tablet Take 1 tablet by mouth 3 (three) times a week.  nortriptyline (PAMELOR) 25 MG capsule TAKE (1) CAPSULE BY MOUTH AT BEDTIME. 30 capsule 0   ONETOUCH VERIO test strip      oxybutynin (DITROPAN-XL) 5 MG 24 hr tablet TAKE 1 TABLET BY MOUTH AT BEDTIME. 90 tablet 0   OZEMPIC, 0.25 OR 0.5 MG/DOSE, 2 MG/3ML SOPN Inject 0.5 mg into the skin every Thursday.     pantoprazole (PROTONIX) 40 MG tablet Take 1 tablet (40 mg total) by mouth 2 (two) times daily before a meal. 30 minutes prior 180 tablet 3   Polyethyl Glycol-Propyl Glycol (SYSTANE OP) Place 1 drop into both eyes 4 (four) times daily as needed (dry eyes).     predniSONE (DELTASONE) 5 MG tablet TAKE 1 TABLET BY MOUTH DAILY WITH BREAKFAST 90 tablet 0   spironolactone (ALDACTONE) 25 MG tablet Take 25 mg by mouth daily.     topiramate (TOPAMAX) 200 MG tablet TAKE (1) TABLET BY MOUTH ONCE AT BEDTIME. 90 tablet 0   Turmeric 500 MG CAPS Take 500 mg by mouth every morning.     No current facility-administered medications for this visit.    Family History  Problem Relation Age of Onset   Diabetes Mother    Pancreatitis Mother    Arthritis Mother        psoriatic    Fibromyalgia Mother    Rheumatic fever Father    Diabetes Other        grandparents   Skin cancer Other        grandfather   Hypertension Other        grandparent   Congestive Heart Failure Other        grandfather   Parkinson's disease Other        grandmother   Colon cancer Maternal Uncle    Fibromyalgia Sister    Rheum arthritis  Sister    Diabetes Sister    Fibromyalgia Sister    Diabetes Sister    Rheum arthritis Sister    Hypertension Son    Colon polyps Neg Hx     ROS: Constitutional: negative Genitourinary:negative  Exam:   BP (!) 172/69 (BP Location: Left Arm, Patient Position: Sitting, Cuff Size: Normal)   Pulse 70   Ht 4\' 10"  (1.473 m)   Wt 122 lb (55.3 kg)   LMP 02/01/1999   BMI 25.50 kg/m   Height: 4\' 10"  (147.3 cm)  General appearance: alert, cooperative and appears stated age Head: Normocephalic, without obvious abnormality, atraumatic Neck: no adenopathy, supple, symmetrical, trachea midline and thyroid normal to inspection and palpation Lungs: clear to auscultation bilaterally Breasts: normal appearance, no masses or tenderness Heart: regular rate and rhythm Abdomen: soft, non-tender; bowel sounds normal; no masses,  no organomegaly Extremities: extremities normal, atraumatic, no cyanosis or edema Skin: Skin color, texture, turgor normal. No rashes or lesions Lymph nodes: Cervical, supraclavicular, and axillary nodes normal. No abnormal inguinal nodes palpated Neurologic: Grossly normal   Pelvic: External genitalia:  no lesions              Urethra:  normal appearing urethra with no masses, tenderness or lesions              Bartholins and Skenes: normal                 Vagina: normal appearing vagina with normal color and no discharge, no lesions              Cervix: no lesions  Pap taken: Yes.   Bimanual Exam:  Uterus:  normal size, contour, position, consistency, mobility, non-tender              Adnexa: normal adnexa and no mass, fullness, tenderness               Rectovaginal: Confirms               Anus:  normal sphincter tone, no lesions  Chaperone, Hendricks Milo, CMA, was present for exam.  Assessment/Plan: 1. Encntr for gyn exam (general) (routine) w/o abn findings - Pap smear obtained today - Mammogram 09/13/2022 - Colonoscopy 04/17/2016, follow up 10  years - Bone mineral density done with Dr. Talmage Nap - lab work done with specialists - vaccines reviewed/updated  2. Cervical cancer screening - Cytology - PAP( [place]) - PR OBTAINING SCREEN PAP SMEAR  3. Age-related osteoporosis without current pathological fracture - going to start Forteo  4. Postmenopausal - not on HRT  5. Mixed incontinence -RF for oxybutynin 5mg  XL daily.  Rx to pharmacy.  6. Immunosuppression (HCC)

## 2022-12-14 LAB — CYTOLOGY - PAP: Diagnosis: NEGATIVE

## 2022-12-19 ENCOUNTER — Encounter: Payer: PPO | Attending: Physical Medicine and Rehabilitation | Admitting: Physical Medicine and Rehabilitation

## 2022-12-19 ENCOUNTER — Encounter: Payer: Self-pay | Admitting: Physical Medicine and Rehabilitation

## 2022-12-19 VITALS — BP 148/78 | HR 95 | Ht <= 58 in | Wt 121.0 lb

## 2022-12-19 DIAGNOSIS — Z5181 Encounter for therapeutic drug level monitoring: Secondary | ICD-10-CM | POA: Insufficient documentation

## 2022-12-19 DIAGNOSIS — Z79891 Long term (current) use of opiate analgesic: Secondary | ICD-10-CM | POA: Diagnosis not present

## 2022-12-19 DIAGNOSIS — M48061 Spinal stenosis, lumbar region without neurogenic claudication: Secondary | ICD-10-CM | POA: Insufficient documentation

## 2022-12-19 DIAGNOSIS — M159 Polyosteoarthritis, unspecified: Secondary | ICD-10-CM | POA: Diagnosis not present

## 2022-12-19 DIAGNOSIS — G894 Chronic pain syndrome: Secondary | ICD-10-CM | POA: Insufficient documentation

## 2022-12-19 MED ORDER — GABAPENTIN 600 MG PO TABS: 600.0000 mg | ORAL_TABLET | Freq: Three times a day (TID) | ORAL | 5 refills | Status: DC

## 2022-12-19 MED ORDER — HYDROCODONE-ACETAMINOPHEN 5-325 MG PO TABS: 1.0000 | ORAL_TABLET | Freq: Three times a day (TID) | ORAL | 0 refills | Status: DC | PRN

## 2022-12-19 NOTE — Progress Notes (Signed)
Subjective:    Patient ID: Tonya Hall, female    DOB: May 05, 1950, 72 y.o.   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     Got injection for thoracic pain- not sure if helped much-  Had a spot on R side- large spot - maybe somewhat better At level pain-  around entire dermatome- back and front side no change.    L SI joint pain went completely away- didn't have to intervene.    Taking Norco- 1x every AM and sometimes a 2nd time.   Taking 5/325 mg 2x/day- not helping a lot, but doesn't want a stronger med/oxy.   Some days takes it, and cannot tell a difference- and some days, really helpful.  And some days the heating pad helps or not as well.  Sometimes helps increase pain threshold- makes her be able to do more to hurt the same.  Intermittently, pain actually "goes away".    Mother died 5 years ago and father died in 2022/11/16 and cleaning out the house.  Having a lot of pain due to this-youngest sister (who's husband died in 2022-08-17)  also helping,, but also has back issues- middle sister not as helpful and brother not here.   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    Came off Lyrica and no difference when came off it.    Cannot walk because got injection in sole of foot- very little padding left- last had injection last Monday.   Has appointment with Rheum- tomorrow- but had pain for 3 weeks MCP of L 4th digit  Is R handed.    Pain Inventory Average Pain 6 Pain Right Now 4 My pain is intermittent, sharp, burning, dull, and aching  In the last 24 hours, has pain interfered with the following? General activity 7 Relation with others 5 Enjoyment of life 8 What TIME of  day is your pain at its worst? varies Sleep (in general) Fair  Pain is worse with: walking, bending, and standing Pain improves with: rest, heat/ice, and medication Relief from Meds: 4  Family History  Problem Relation Age of Onset   Diabetes Mother    Pancreatitis Mother    Arthritis Mother        psoriatic    Fibromyalgia Mother    Rheumatic fever Father    Diabetes Other        grandparents   Skin cancer Other        grandfather   Hypertension Other        grandparent   Congestive Heart Failure Other        grandfather   Parkinson's disease Other        grandmother   Colon cancer Maternal Uncle    Fibromyalgia Sister    Rheum arthritis Sister    Diabetes Sister    Fibromyalgia Sister    Diabetes Sister    Rheum arthritis Sister    Hypertension Son    Colon polyps Neg Hx    Social History   Socioeconomic History   Marital status: Married    Spouse name: Not on file   Number of children:  Not on file   Years of education: Not on file   Highest education level: Not on file  Occupational History   Occupation: Case Mgr RN    Employer: [employer]    Comment: [employer]  Tobacco Use   Smoking status: Never    Passive exposure: Never   Smokeless tobacco: Never  Vaping Use   Vaping status: Never Used  Substance and Sexual Activity   Alcohol use: No   Drug use: No   Sexual activity: Not Currently    Partners: Male    Birth control/protection: Surgical    Comment: BTL  Other Topics Concern   Not on file  Social History Narrative   ATTENDS [employer]. 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   Social Determinants of Health   Financial Resource Strain: Not on file  Food Insecurity: Not on file  Transportation Needs: Not on file  Physical Activity: Not on file  Stress: Not on file  Social Connections: Not on file   Past Surgical History:  Procedure Laterality Date   ANTERIOR CERVICAL DECOMP/DISCECTOMY FUSION N/A 03/09/2020   Procedure: ANTERIOR  CERVICAL DECOMPRESSION/DISCECTOMY FUSION, INTERBODY PROSTHESIS, PLATE SCREWS CERVICAL FIVE-SIX, CERVICAL SIX-SEVEN;  Surgeon: Tressie Stalker, MD;  Location: [facility] OR;  Service: Neurosurgery;  Laterality: N/A;   BACK SURGERY  2003   BALLOON DILATION N/A 11/12/2022   Procedure: BALLOON DILATION;  Surgeon: Lanelle Bal, DO;  [facility];  Service: Endoscopy;  Laterality: N/A;   BIOPSY  04/17/2016   Procedure: BIOPSY;  Surgeon: West Bali, MD;  [facility];  Service: Endoscopy;;  random colon bx's   BIOPSY  11/12/2022   Procedure: BIOPSY;  Surgeon: Lanelle Bal, DO;  [facility];  Service: Endoscopy;;   CERVICAL SPINE SURGERY  09/09/2017   CESAREAN SECTION  1974, 1978       COLONOSCOPY  06/19/2008   [id number] internal hemorrhoids/mild sigmoin colon diverticulosis   COLONOSCOPY  2012   Dr. Jena Gauss: normal rectum, diverticula, lymphocytic colitis    COLONOSCOPY WITH PROPOFOL N/A 04/17/2016   Dr. Darrick Penna: Normal terminal ileum, internal/external hemorrhoids, random colon biopsies consistent with collagenous colitis   DILATION AND CURETTAGE OF UTERUS  1977   spontaneous abortion   ESOPHAGOGASTRODUODENOSCOPY  06/19/2008   [id number] gastritis   ESOPHAGOGASTRODUODENOSCOPY (EGD) WITH PROPOFOL N/A 11/12/2022   Procedure: ESOPHAGOGASTRODUODENOSCOPY (EGD) WITH PROPOFOL;  Surgeon: Lanelle Bal, DO;  [facility];  Service: Endoscopy;  Laterality: N/A;  8:30 am, asa 3   LUMBAR DISC ARTHROPLASTY  09/1996   LUMBAR FUSION  6/03, 11/08, 11/14   L4-5 fusion, L2-3 fusion, L1-2   LUMBAR FUSION  08/03/2020   L4-L5   LUMBAR LAMINECTOMY  03/2020   MOUTH SURGERY  12/07/2021   oral surgery   ODONTOID SCREW INSERTION N/A 01/27/2018   Procedure: ODONTOID SCREW INSERTION;  Surgeon: Tressie Stalker, MD;  Location: [facility] OR;  Service: Neurosurgery;  Laterality: N/A;  ODONTOID SCREW INSERTION   TONSILLECTOMY     TONSILLECTOMY AND ADENOIDECTOMY     TUBAL  LIGATION     Past Surgical History:  Procedure Laterality Date   ANTERIOR CERVICAL DECOMP/DISCECTOMY FUSION N/A 03/09/2020   Procedure: ANTERIOR CERVICAL DECOMPRESSION/DISCECTOMY FUSION, INTERBODY PROSTHESIS, PLATE SCREWS CERVICAL FIVE-SIX, CERVICAL SIX-SEVEN;  Surgeon: Tressie Stalker, MD;  Location: [facility] OR;  Service: Neurosurgery;  Laterality: N/A;   BACK SURGERY  2003   BALLOON DILATION N/A 11/12/2022   Procedure: BALLOON DILATION;  Surgeon: Lanelle Bal, DO;  [facility];  Service: Endoscopy;  Laterality: N/A;   BIOPSY  04/17/2016   Procedure: BIOPSY;  Surgeon: West Bali, MD;  [facility];  Service: Endoscopy;;  random colon bx's   BIOPSY  11/12/2022   Procedure: BIOPSY;  Surgeon: Lanelle Bal, DO;  [facility];  Service: Endoscopy;;   CERVICAL SPINE SURGERY  09/09/2017   CESAREAN SECTION  1974, 1978       COLONOSCOPY  06/19/2008   JXB:[id number] internal hemorrhoids/mild sigmoin colon diverticulosis   COLONOSCOPY  2012   Dr. Jena Gauss: normal rectum, diverticula, lymphocytic colitis    COLONOSCOPY WITH PROPOFOL N/A 04/17/2016   Dr. Darrick Penna: Normal terminal ileum, internal/external hemorrhoids, random colon biopsies consistent with collagenous colitis   DILATION AND CURETTAGE OF UTERUS  1977   spontaneous abortion   ESOPHAGOGASTRODUODENOSCOPY  06/19/2008   [id number] gastritis   ESOPHAGOGASTRODUODENOSCOPY (EGD) WITH PROPOFOL N/A 11/12/2022   Procedure: ESOPHAGOGASTRODUODENOSCOPY (EGD) WITH PROPOFOL;  Surgeon: Lanelle Bal, DO;  [facility];  Service: Endoscopy;  Laterality: N/A;  8:30 am, asa 3   LUMBAR DISC ARTHROPLASTY  09/1996   LUMBAR FUSION  6/03, 11/08, 11/14   L4-5 fusion, L2-3 fusion, L1-2   LUMBAR FUSION  08/03/2020   L4-L5   LUMBAR LAMINECTOMY  03/2020   MOUTH SURGERY  12/07/2021   oral surgery   ODONTOID SCREW INSERTION N/A 01/27/2018   Procedure: ODONTOID SCREW INSERTION;  Surgeon: Tressie Stalker, MD;   Location: [facility] OR;  Service: Neurosurgery;  Laterality: N/A;  ODONTOID SCREW INSERTION   TONSILLECTOMY     TONSILLECTOMY AND ADENOIDECTOMY     TUBAL LIGATION     Past Medical History:  Diagnosis Date   Asthma    Albuterol in haler prn   Cataract    immature unsure which eye   Chronic back pain    COVID-19 October/November 2020   Degenerative disk disease    psoriatic   Diabetes mellitus without complication (HCC)    diet controlled   Esophageal motility disorder    Non-specific, see modified barium study/speech path, BP   Fibromyalgia    GERD (gastroesophageal reflux disease)    takes Protonix bid   Headache 2019   History of blood transfusion    post c-section   History of bronchitis    HTN (hypertension)    Hyperlipidemia    takes Pravastatin daily   Lymphocytic colitis 05/26/2010   Responded to Entocort x 3 MOS   Neuropathy    Nonallergic rhinitis    Osteoporosis    gets Boniva every 3 months   Peripheral edema    takes HCTZ daily   Pneumonia 02/2019   Psoriatic arthritis (HCC)    Dr. Anthonette Legato   Psoriatic arthritis [facility])    bilateral hands   Raynaud's disease    Seasonal allergies    Shingles 2020   SUI (stress urinary incontinence, female)    Urinary urgency    BP (!) 148/78   Pulse 95   Ht 4\' 10"  (1.473 m)   Wt 121 lb (54.9 kg)   LMP 02/01/1999   SpO2 97%   BMI 25.29 kg/m   Opioid Risk Score:   Fall Risk Score:  `1  Depression screen [facility] 2/9     12/13/2022   10:04 AM 09/19/2022   10:21 AM 06/15/2022    9:42 AM 12/12/2020   10:27 AM 11/19/2017    3:26 PM  Depression screen PHQ 2/9  Decreased Interest 0 0 0  0 0  Down, Depressed, Hopeless 0 0 0 0 0  PHQ - 2 Score 0 0 0 0 0  Altered sleeping   0    Tired, decreased energy   2    Change in appetite   0    Feeling bad or failure about yourself    0    Trouble concentrating   0    Moving slowly or fidgety/restless   1    Suicidal thoughts   0    PHQ-9 Score   3    Difficult doing work/chores    Very difficult        Review of Systems  Musculoskeletal:  Positive for back pain and gait problem.       B/L shoulder, hip, hand pain Right foot pain  All other systems reviewed and are negative.     Objective:   Physical Exam   Awake, alert, appropriate, very uncomfortable on table- moved to recline table and put foot rest up- now NAD TTP across low back and in dermatome- T6/7- just below breasts and in large hand sized spot on T4-T8 on R paraspinals.  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     Take 1.5 tabs 2x/day of Norco- to let me know if the increased dose helps more.  -BUT will increase Norco to 3x/day as needed to see if needs /higher dose works better- #90.   2. UDS- urine drug screen due today per clinic policy.    3. Con't Gabapentin 600 mg 3x/day- - needs refills- sent in 5 refills.    4. Doesn't think will have more steroid back injections- not helpful enough.    5.  F/U in 3months- f/u on pain   I spent a total of  23  minutes on total care today- >50% coordination of care- due to d/w pt about changing pain meds- refilling gabapentin and d/w pt about life stressors with father dying, etc.  Also UDS

## 2022-12-19 NOTE — Patient Instructions (Signed)
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     Take 1.5 tabs 2x/day of Norco- to let me know if the increased dose helps more.  -BUT will increase Norco to 3x/day as needed to see if needs /higher dose works better- #90.   2. UDS- urine drug screen due today per clinic policy.    3. Con't Gabapentin 600 mg 3x/day- - needs refills- sent in 5 refills.    4. Doesn't think will have more steroid back injections- not helpful enough.    5.  F/U in 3months- f/u on pain

## 2022-12-20 ENCOUNTER — Encounter: Payer: PPO | Attending: Endocrinology | Admitting: Rheumatology

## 2022-12-20 ENCOUNTER — Encounter: Payer: Self-pay | Admitting: Rheumatology

## 2022-12-20 VITALS — BP 163/79 | HR 81 | Resp 14 | Ht <= 58 in | Wt 122.0 lb

## 2022-12-20 DIAGNOSIS — M5030 Other cervical disc degeneration, unspecified cervical region: Secondary | ICD-10-CM | POA: Diagnosis not present

## 2022-12-20 DIAGNOSIS — M810 Age-related osteoporosis without current pathological fracture: Secondary | ICD-10-CM | POA: Diagnosis not present

## 2022-12-20 DIAGNOSIS — L409 Psoriasis, unspecified: Secondary | ICD-10-CM | POA: Diagnosis not present

## 2022-12-20 DIAGNOSIS — L4050 Arthropathic psoriasis, unspecified: Secondary | ICD-10-CM

## 2022-12-20 DIAGNOSIS — Z8719 Personal history of other diseases of the digestive system: Secondary | ICD-10-CM

## 2022-12-20 DIAGNOSIS — M51369 Other intervertebral disc degeneration, lumbar region without mention of lumbar back pain or lower extremity pain: Secondary | ICD-10-CM

## 2022-12-20 DIAGNOSIS — M65949 Unspecified synovitis and tenosynovitis, unspecified hand: Secondary | ICD-10-CM

## 2022-12-20 DIAGNOSIS — M5134 Other intervertebral disc degeneration, thoracic region: Secondary | ICD-10-CM | POA: Diagnosis not present

## 2022-12-20 DIAGNOSIS — M5136 Other intervertebral disc degeneration, lumbar region: Secondary | ICD-10-CM | POA: Diagnosis not present

## 2022-12-20 DIAGNOSIS — I10 Essential (primary) hypertension: Secondary | ICD-10-CM

## 2022-12-20 DIAGNOSIS — G894 Chronic pain syndrome: Secondary | ICD-10-CM

## 2022-12-20 DIAGNOSIS — Z79899 Other long term (current) drug therapy: Secondary | ICD-10-CM

## 2022-12-20 DIAGNOSIS — I7300 Raynaud's syndrome without gangrene: Secondary | ICD-10-CM

## 2022-12-20 DIAGNOSIS — M170 Bilateral primary osteoarthritis of knee: Secondary | ICD-10-CM

## 2022-12-20 DIAGNOSIS — E2740 Unspecified adrenocortical insufficiency: Secondary | ICD-10-CM

## 2022-12-20 DIAGNOSIS — M533 Sacrococcygeal disorders, not elsewhere classified: Secondary | ICD-10-CM | POA: Diagnosis not present

## 2022-12-20 DIAGNOSIS — G8929 Other chronic pain: Secondary | ICD-10-CM

## 2022-12-20 DIAGNOSIS — M659 Synovitis and tenosynovitis, unspecified: Secondary | ICD-10-CM | POA: Diagnosis not present

## 2022-12-20 DIAGNOSIS — R600 Localized edema: Secondary | ICD-10-CM

## 2022-12-20 DIAGNOSIS — M7061 Trochanteric bursitis, right hip: Secondary | ICD-10-CM

## 2022-12-20 DIAGNOSIS — D692 Other nonthrombocytopenic purpura: Secondary | ICD-10-CM

## 2022-12-20 DIAGNOSIS — K52831 Collagenous colitis: Secondary | ICD-10-CM

## 2022-12-20 DIAGNOSIS — Z8639 Personal history of other endocrine, nutritional and metabolic disease: Secondary | ICD-10-CM

## 2022-12-20 DIAGNOSIS — K224 Dyskinesia of esophagus: Secondary | ICD-10-CM

## 2022-12-20 NOTE — Patient Instructions (Signed)
Standing Labs We placed an order today for your standing lab work.   Please have your standing labs drawn in December and every 3 months  Please have your labs drawn 2 weeks prior to your appointment so that the provider can discuss your lab results at your appointment, if possible.  Please note that you may see your imaging and lab results in MyChart before we have reviewed them. We will contact you once all results are reviewed. Please allow our office up to 72 hours to thoroughly review all of the results before contacting the office for clarification of your results.  WALK-IN LAB HOURS  Monday through Thursday from 8:00 am -12:30 pm and 1:00 pm-5:00 pm and Friday from 8:00 am-12:00 pm.  Patients with office visits requiring labs will be seen before walk-in labs.  You may encounter longer than normal wait times. Please allow additional time. Wait times may be shorter on  Monday and Thursday afternoons.  We do not book appointments for walk-in labs. We appreciate your patience and understanding with our staff.   Labs are drawn by [facility]. Please bring your co-pay at the time of your lab draw.  You may receive a bill from [facility] for your lab work.  Please note if you are on Hydroxychloroquine and and an order has been placed for a Hydroxychloroquine level,  you will need to have it drawn 4 hours or more after your last dose.  If you wish to have your labs drawn at another location, please call the office 24 hours in advance so we can fax the orders.  The office is located at [address], [place], [place] [address]   If you have any questions regarding directions or hours of operation,  please call [phone].   As a reminder, please drink plenty of water prior to coming for your lab work. Thanks!   Vaccines You are taking a medication(s) that can suppress your immune system.  The following immunizations are recommended: Flu annually Covid-19  RSV Td/Tdap (tetanus,  diphtheria, pertussis) every 10 years Pneumonia (Prevnar 15 then Pneumovax 23 at least 1 year apart.  Alternatively, can take Prevnar 20 without needing additional dose) Shingrix: 2 doses from 4 weeks to 6 months apart  Please check with your PCP to make sure you are up to date.  If you have signs or symptoms of an infection or start antibiotics: First, call your PCP for workup of your infection. Hold your medication through the infection, until you complete your antibiotics, and until symptoms resolve if you take the following: Injectable medication (Actemra, Benlysta, Cimzia, Cosentyx, Enbrel, Humira, Kevzara, Orencia, Remicade, Simponi, Stelara, Taltz, Tremfya) Methotrexate Leflunomide (Arava) Mycophenolate (Cellcept) Harriette Ohara, Olumiant, or Rinvoq

## 2022-12-21 LAB — CBC WITH DIFFERENTIAL/PLATELET
Absolute Monocytes: 961 cells/uL — ABNORMAL HIGH (ref 200–950)
Basophils Absolute: 79 cells/uL (ref 0–200)
Basophils Relative: 0.7 %
Eosinophils Absolute: 520 cells/uL — ABNORMAL HIGH (ref 15–500)
Eosinophils Relative: 4.6 %
HCT: 38.8 % (ref 35.0–45.0)
Hemoglobin: 12.5 g/dL (ref 11.7–15.5)
Lymphs Abs: 1898 cells/uL (ref 850–3900)
MCH: 31.5 pg (ref 27.0–33.0)
MCHC: 32.2 g/dL (ref 32.0–36.0)
MCV: 97.7 fL (ref 80.0–100.0)
MPV: 10.7 fL (ref 7.5–12.5)
Monocytes Relative: 8.5 %
Neutro Abs: 7842 cells/uL — ABNORMAL HIGH (ref 1500–7800)
Neutrophils Relative %: 69.4 %
Platelets: 229 10*3/uL (ref 140–400)
RBC: 3.97 10*6/uL (ref 3.80–5.10)
RDW: 12.6 % (ref 11.0–15.0)
Total Lymphocyte: 16.8 %
WBC: 11.3 10*3/uL — ABNORMAL HIGH (ref 3.8–10.8)

## 2022-12-21 LAB — COMPLETE METABOLIC PANEL WITH GFR
AG Ratio: 1.9 (calc) (ref 1.0–2.5)
ALT: 22 U/L (ref 6–29)
AST: 18 U/L (ref 10–35)
Albumin: 4.2 g/dL (ref 3.6–5.1)
Alkaline phosphatase (APISO): 47 U/L (ref 37–153)
BUN/Creatinine Ratio: 32 (calc) — ABNORMAL HIGH (ref 6–22)
BUN: 29 mg/dL — ABNORMAL HIGH (ref 7–25)
CO2: 23 mmol/L (ref 20–32)
Calcium: 9.3 mg/dL (ref 8.6–10.4)
Chloride: 102 mmol/L (ref 98–110)
Creat: 0.92 mg/dL (ref 0.60–1.00)
Globulin: 2.2 g/dL (calc) (ref 1.9–3.7)
Glucose, Bld: 107 mg/dL — ABNORMAL HIGH (ref 65–99)
Potassium: 3.6 mmol/L (ref 3.5–5.3)
Sodium: 134 mmol/L — ABNORMAL LOW (ref 135–146)
Total Bilirubin: 0.3 mg/dL (ref 0.2–1.2)
Total Protein: 6.4 g/dL (ref 6.1–8.1)
eGFR: 66 mL/min/{1.73_m2} (ref >=60)

## 2022-12-21 NOTE — Progress Notes (Signed)
White cell count is elevated most likely due to steroid use.  Sodium is low and stable.

## 2022-12-27 LAB — TOXASSURE SELECT,+ANTIDEPR,UR

## 2023-01-03 ENCOUNTER — Other Ambulatory Visit: Payer: Self-pay | Admitting: Physician Assistant

## 2023-01-04 NOTE — Telephone Encounter (Signed)
Last Fill: 10/07/2022  Next Visit: 05/15/2023  Last Visit: 12/20/2022  Dx: Psoriatic arthritis   Current Dose per office note on 12/20/2022: prednisone 5 mg 1 tablet daily   Okay to refill Prednisone?

## 2023-01-09 ENCOUNTER — Encounter: Payer: Self-pay | Admitting: Internal Medicine

## 2023-01-16 ENCOUNTER — Ambulatory Visit: Payer: PPO | Admitting: Podiatry

## 2023-01-23 ENCOUNTER — Ambulatory Visit: Payer: PPO | Admitting: Podiatry

## 2023-01-23 ENCOUNTER — Encounter: Payer: Self-pay | Admitting: Podiatry

## 2023-01-23 DIAGNOSIS — M2012 Hallux valgus (acquired), left foot: Secondary | ICD-10-CM | POA: Diagnosis not present

## 2023-01-23 DIAGNOSIS — M2042 Other hammer toe(s) (acquired), left foot: Secondary | ICD-10-CM

## 2023-01-23 DIAGNOSIS — M2011 Hallux valgus (acquired), right foot: Secondary | ICD-10-CM

## 2023-01-23 DIAGNOSIS — E1142 Type 2 diabetes mellitus with diabetic polyneuropathy: Secondary | ICD-10-CM | POA: Diagnosis not present

## 2023-01-23 DIAGNOSIS — M2041 Other hammer toe(s) (acquired), right foot: Secondary | ICD-10-CM | POA: Diagnosis not present

## 2023-01-23 DIAGNOSIS — B351 Tinea unguium: Secondary | ICD-10-CM

## 2023-01-23 DIAGNOSIS — M5430 Sciatica, unspecified side: Secondary | ICD-10-CM | POA: Insufficient documentation

## 2023-01-23 DIAGNOSIS — Z981 Arthrodesis status: Secondary | ICD-10-CM | POA: Insufficient documentation

## 2023-01-23 DIAGNOSIS — E119 Type 2 diabetes mellitus without complications: Secondary | ICD-10-CM | POA: Diagnosis not present

## 2023-01-23 DIAGNOSIS — L84 Corns and callosities: Secondary | ICD-10-CM

## 2023-01-23 NOTE — Progress Notes (Signed)
ANNUAL DIABETIC FOOT EXAM  Subjective: Tonya Hall presents today annual diabetic foot exam.  Chief Complaint  Patient presents with   Callouses    RM# 16 follow up patient states that she is feeling good.   Patient confirms h/o diabetes.  Patient denies any h/o foot wounds.  Patient has been diagnosed with neuropathy.  Carylon Perches, MD is patient's PCP.  Past Medical History:  Diagnosis Date   Asthma    Albuterol in haler prn   Cataract    immature unsure which eye   Chronic back pain    COVID-19 October/November 2020   Degenerative disk disease    psoriatic   Diabetes mellitus without complication (HCC)    diet controlled   Esophageal motility disorder    Non-specific, see modified barium study/speech path, BP   Fibromyalgia    GERD (gastroesophageal reflux disease)    takes Protonix bid   Headache 2019   History of blood transfusion    post c-section   History of bronchitis    HTN (hypertension)    Hyperlipidemia    takes Pravastatin daily   Lymphocytic colitis 05/26/2010   Responded to Entocort x 3 MOS   Neuropathy    Nonallergic rhinitis    Osteoporosis    gets Boniva every 3 months   Peripheral edema    takes HCTZ daily   Pneumonia 02/2019   Psoriatic arthritis (HCC)    Dr. Anthonette Legato   Psoriatic arthritis [facility])    bilateral hands   Raynaud's disease    Seasonal allergies    Shingles 2020   SUI (stress urinary incontinence, female)    Urinary urgency    Patient Active Problem List   Diagnosis Date Noted   Encounter for diabetic foot exam (HCC) 01/28/2023   Diabetic peripheral neuropathy associated with type 2 diabetes mellitus (HCC) 01/28/2023   Acquired hammertoes of both feet 01/28/2023   Hallux valgus, acquired, bilateral 01/28/2023   Corns and callosities 01/28/2023   Onychomycosis 01/28/2023   History of lumbar fusion 01/23/2023   Sciatica 01/23/2023   Statin not tolerated 10/23/2021   Chronic allergic conjunctivitis 07/29/2021    Vasomotor rhinitis 07/29/2021   Benign neoplasm of skin of lower limb 05/17/2021   History of neoplasm 05/17/2021   Melanocytic nevi of left upper limb, including shoulder 05/17/2021   Melanocytic nevi of trunk 05/17/2021   Other seborrheic keratosis 05/17/2021   Diarrhea 03/10/2021   Fracture of pelvis (HCC) 01/27/2021   Pain of left hip joint 01/06/2021   Foraminal stenosis of lumbar region 08/03/2020   Cervical spondylosis with myelopathy and radiculopathy 03/09/2020   AKI (acute kidney injury) (HCC) 02/07/2019   Acute respiratory failure with hypoxia (HCC) 02/06/2019   Acute respiratory disease due to COVID-19 virus 02/06/2019   Community acquired pneumonia 02/06/2019   Hyponatremia 02/06/2019   Asthma    Diabetes mellitus without complication (HCC)    Closed nondisplaced odontoid fracture with type II morphology (HCC) 01/27/2018   Odontoid fracture (HCC) 01/24/2018   Chest pain 11/14/2017   Transaminitis 06/22/2016   Collagenous colitis 05/23/2016   Psoriasis 05/21/2016   High risk medication use 02/15/2016   Age-related osteoporosis without current pathological fracture 02/15/2016   Trochanteric bursitis, left hip 02/15/2016   Sacroiliitis, not elsewhere classified (HCC) 02/15/2016   Chronic pain syndrome 02/15/2016   Abnormal weight gain 06/25/2012   Hypertension 07/03/2011   Hypercholesteremia 07/03/2011   Psoriatic arthritis (HCC) 07/03/2011   Osteoarthritis of multiple joints 07/03/2011   Esophageal  motility disorder 02/14/2011   Cough 02/14/2011   GERD 05/05/2010   Past Surgical History:  Procedure Laterality Date   ANTERIOR CERVICAL DECOMP/DISCECTOMY FUSION N/A 03/09/2020   Procedure: ANTERIOR CERVICAL DECOMPRESSION/DISCECTOMY FUSION, INTERBODY PROSTHESIS, PLATE SCREWS CERVICAL FIVE-SIX, CERVICAL SIX-SEVEN;  Surgeon: Tressie Stalker, MD;  Location: [facility] OR;  Service: Neurosurgery;  Laterality: N/A;   BACK SURGERY  2003   BALLOON DILATION N/A 11/12/2022    Procedure: BALLOON DILATION;  Surgeon: Lanelle Bal, DO;  Location: [facility];  Service: Endoscopy;  Laterality: N/A;   BIOPSY  04/17/2016   Procedure: BIOPSY;  Surgeon: West Bali, MD;  Location: [facility];  Service: Endoscopy;;  random colon bx's   BIOPSY  11/12/2022   Procedure: BIOPSY;  Surgeon: Lanelle Bal, DO;  Location: [facility];  Service: Endoscopy;;   CERVICAL SPINE SURGERY  09/09/2017   CESAREAN SECTION  1974, 1978       COLONOSCOPY  06/19/2008   KGM:WNUUV internal hemorrhoids/mild sigmoin colon diverticulosis   COLONOSCOPY  2012   Dr. Jena Gauss: normal rectum, diverticula, lymphocytic colitis    COLONOSCOPY WITH PROPOFOL N/A 04/17/2016   Dr. Darrick Penna: Normal terminal ileum, internal/external hemorrhoids, random colon biopsies consistent with collagenous colitis   DILATION AND CURETTAGE OF UTERUS  1977   spontaneous abortion   ESOPHAGOGASTRODUODENOSCOPY  06/19/2008   OZD:[id number] gastritis   ESOPHAGOGASTRODUODENOSCOPY (EGD) WITH PROPOFOL N/A 11/12/2022   Procedure: ESOPHAGOGASTRODUODENOSCOPY (EGD) WITH PROPOFOL;  Surgeon: Lanelle Bal, DO;  Location: [facility];  Service: Endoscopy;  Laterality: N/A;  8:30 am, asa 3   LUMBAR DISC ARTHROPLASTY  09/1996   LUMBAR FUSION  6/03, 11/08, 11/14   L4-5 fusion, L2-3 fusion, L1-2   LUMBAR FUSION  08/03/2020   L4-L5   LUMBAR LAMINECTOMY  03/2020   MOUTH SURGERY  12/07/2021   oral surgery   ODONTOID SCREW INSERTION N/A 01/27/2018   Procedure: ODONTOID SCREW INSERTION;  Surgeon: Tressie Stalker, MD;  Location: [facility] OR;  Service: Neurosurgery;  Laterality: N/A;  ODONTOID SCREW INSERTION   TONSILLECTOMY     TONSILLECTOMY AND ADENOIDECTOMY     TUBAL LIGATION     Current Outpatient Medications on File Prior to Visit  Medication Sig Dispense Refill   albuterol (PROVENTIL HFA;VENTOLIN HFA) 108 (90 BASE) MCG/ACT inhaler Inhale 2 puffs into the lungs every 6 (six) hours as needed for wheezing or shortness of breath.       Apremilast (OTEZLA) 30 MG TABS Take 1 tablet (30 mg total) by mouth 2 (two) times daily. 180 tablet 0   aspirin 81 MG tablet Take 81 mg by mouth daily.     Blood Glucose Monitoring Suppl (ONETOUCH VERIO FLEX SYSTEM) w/Device KIT      cetirizine (ZYRTEC) 10 MG tablet Take 10 mg by mouth daily as needed (allergies).     chlorhexidine (PERIDEX) 0.12 % solution Use as directed 15 mLs in the mouth or throat at bedtime.     fish oil-omega-3 fatty acids 1000 MG capsule Take 2 capsules (2 g total) by mouth daily. 2-4 GRAMS DAILY (Patient taking differently: Take 1 g by mouth 2 (two) times daily. 2-4 GRAMS DAILY)     gabapentin (NEURONTIN) 600 MG tablet Take 1 tablet (600 mg total) by mouth 3 (three) times daily. For nerve pain 90 tablet 5   Ginger, Zingiber officinalis, (GINGER ROOT) 500 MG CAPS Take 500 mg by mouth daily.     Glucosamine Sulfate 1000 MG CAPS Take 2,000 mg by mouth daily.  HYDROcodone-acetaminophen (NORCO/VICODIN) 5-325 MG tablet Take 1 tablet by mouth 3 (three) times daily as needed. 90 tablet 0   hydrocortisone sodium succinate (SOLU-CORTEF) 100 MG SOLR injection Inject 100 mg into the muscle daily as needed (when unable to keep down prednisone tablet).      Ixekizumab (TALTZ) 80 MG/ML SOAJ Inject 80 mg into the skin every 28 (twenty-eight) days. 3 mL 0   Lancets (ONETOUCH DELICA PLUS LANCET33G) MISC USE TO TEST 3 TIMESUDAILY.     leflunomide (ARAVA) 20 MG tablet TAKE 1 TABLET BY MOUTH ONCE DAILY. 90 tablet 0   losartan (COZAAR) 100 MG tablet Take 100 mg by mouth daily.     metoprolol succinate (TOPROL-XL) 50 MG 24 hr tablet Take 1 tablet (50 mg total) by mouth daily. Take with or immediately following a meal.     Misc Natural Products (TART CHERRY ADVANCED PO) Take 1 tablet by mouth daily. daily     Multiple Vitamin (MULTIVITAMIN) tablet Take 1 tablet by mouth 3 (three) times a week.     nortriptyline (PAMELOR) 25 MG capsule TAKE (1) CAPSULE BY MOUTH AT BEDTIME. 30 capsule 0    ONETOUCH VERIO test strip      oxybutynin (DITROPAN-XL) 5 MG 24 hr tablet Take 1 tablet (5 mg total) by mouth at bedtime. 90 tablet 3   pantoprazole (PROTONIX) 40 MG tablet Take 1 tablet (40 mg total) by mouth 2 (two) times daily before a meal. 30 minutes prior 180 tablet 3   Polyethyl Glycol-Propyl Glycol (SYSTANE OP) Place 1 drop into both eyes 4 (four) times daily as needed (dry eyes).     predniSONE (DELTASONE) 5 MG tablet TAKE 1 TABLET BY MOUTH DAILY WITH BREAKFAST 90 tablet 0   spironolactone (ALDACTONE) 25 MG tablet Take 25 mg by mouth daily.     topiramate (TOPAMAX) 200 MG tablet TAKE (1) TABLET BY MOUTH ONCE AT BEDTIME. 90 tablet 0   Turmeric 500 MG CAPS Take 500 mg by mouth every morning.     OZEMPIC, 0.25 OR 0.5 MG/DOSE, 2 MG/3ML SOPN Inject 0.5 mg into the skin every Thursday. (Patient not taking: Reported on 01/23/2023)     No current facility-administered medications on file prior to visit.    Allergies  Allergen Reactions   Adalimumab Rash and Other (See Comments)    FEVER, Fast pulse Other reaction(s): Unknown   Imuran [Azathioprine Sodium] Rash    Denies Airway involvement   Methotrexate Other (See Comments)    Liver Enzyme elevation - after 1 month of use Other reaction(s): Unknown   Plaquenil [Hydroxychloroquine Sulfate] Rash    Denies airway involvement.    Sulfonamide Derivatives Rash    Occurred with Sulfasalazine. Rash only.    Azathioprine     Other reaction(s): Unknown   Ceftin [Cefuroxime Axetil] Nausea Only    Upset stomach   Cefuroxime     Other reaction(s): GI upset   Lisinopril     Other reaction(s): Unknown   Sulfamethoxazole-Trimethoprim     Other reaction(s): Unknown   Morphine Nausea And Vomiting    IV morphine caused N and V  After surgery.   Has tolerated Kadian in the past    Social History   Occupational History   Occupation: Case Midwife: [employer]    Comment: Estate manager/land agent  Tobacco Use   Smoking status: Never    Passive  exposure: Never   Smokeless tobacco: Never  Vaping Use   Vaping status: Never  Used  Substance and Sexual Activity   Alcohol use: No   Drug use: No   Sexual activity: Not Currently    Partners: Male    Birth control/protection: Surgical    Comment: BTL   Family History  Problem Relation Age of Onset   Diabetes Mother    Pancreatitis Mother    Arthritis Mother        psoriatic    Fibromyalgia Mother    Rheumatic fever Father    Diabetes Other        grandparents   Skin cancer Other        grandfather   Hypertension Other        grandparent   Congestive Heart Failure Other        grandfather   Parkinson's disease Other        grandmother   Colon cancer Maternal Uncle    Fibromyalgia Sister    Rheum arthritis Sister    Diabetes Sister    Fibromyalgia Sister    Diabetes Sister    Rheum arthritis Sister    Hypertension Son    Colon polyps Neg Hx    Immunization History  Administered Date(s) Administered   Influenza,inj,Quad PF,6+ Mos 01/21/2017   Influenza-Unspecified 01/30/2017   Moderna Sars-Covid-2 Vaccination 06/01/2019, 07/02/2019   Pneumococcal Polysaccharide-23 02/19/2013   Tdap 09/10/2016     Review of Systems: Negative except as noted in the HPI.   Objective: There were no vitals filed for this visit.  NIAMBI HYETT is a pleasant 72 y.o. female in NAD. AAO X 3.  Title   Diabetic Foot Exam - detailed Date & Time: 01/23/2023  8:45 AM Diabetic Foot exam was performed with the following findings: Yes  Visual Foot Exam completed.: Yes  Is there a history of foot ulcer?: No Is there a foot ulcer now?: No Is there swelling?: No Is there elevated skin temperature?: No Is there abnormal foot shape?: Yes (Comment: Hammertoes 2-5 b/l. HAV with bunion b/l.) Is there a claw toe deformity?: No Are the toenails long?: Yes Are the toenails thick?: Yes Are the toenails ingrown?: No Is the skin thin, fragile, shiny and hairless?": No Normal Range of Motion?:  Yes Is there foot or ankle muscle weakness?: No Do you have pain in calf while walking?: No Are the shoes appropriate in style and fit?: Yes Can the patient see the bottom of their feet?: No Pulse Foot Exam completed.: Yes   Right Posterior Tibialis: Present Left posterior Tibialis: Present   Right Dorsalis Pedis: Present Left Dorsalis Pedis: Present     Sensory Foot Exam Completed.: Yes Semmes-Weinstein Monofilament Test "+" means "has sensation" and "-" means "no sensation"  R Foot Test Control: Pos L Foot Test Control: Pos   R Site 1-Great Toe: Pos L Site 1-Great Toe: Pos   R Site 4: Pos L Site 4: Pos   R site 5: Pos L Site 5: Pos  R Site 6: Pos L Site 6: Pos     Image components are not supported.   Image components are not supported. Image components are not supported.  Tuning Fork Right vibratory: present Left vibratory: present  Comments Pt has subjective symptoms of neuropathy.  Hyperkeratotic lesion(s) R 3rd toe, submet head 2 b/l, and submet head 3 b/l.  No erythema, no edema, no drainage, no fluctuance.       Lab Results  Component Value Date   HGBA1C 6.6 (H) 08/01/2020   ADA Risk Categorization: Low  Risk :  Patient has all of the following: Intact protective sensation No prior foot ulcer  No severe deformity Pedal pulses present  Assessment: 1. Onychomycosis   2. Corns and callosities   3. Hallux valgus, acquired, bilateral   4. Acquired hammertoes of both feet   5. Diabetic peripheral neuropathy associated with type 2 diabetes mellitus (HCC)   6. Encounter for diabetic foot exam (HCC)     Plan: -Consent given for treatment as described below: -Examined patient. -Diabetic foot examination performed today. -Continue diabetic foot care principles: inspect feet daily, monitor glucose as recommended by PCP and/or Endocrinologist, and follow prescribed diet per PCP, Endocrinologist and/or dietician. -Patient to continue soft, supportive shoe gear  daily. -Toenails 1-5 b/l were debrided in length and girth with sterile nail nippers and dremel without iatrogenic bleeding.  -Corn(s) R 3rd toe and callus(es) submet head 2 b/l and submet head 3 b/l were pared utilizing sterile scalpel blade without incident. Total number debrided =5. -Patient/POA to call should there be question/concern in the interim. Return in about 3 months (around 04/25/2023).  Freddie Breech, DPM

## 2023-01-28 ENCOUNTER — Ambulatory Visit: Payer: PPO | Admitting: Gastroenterology

## 2023-01-28 ENCOUNTER — Encounter: Payer: Self-pay | Admitting: Gastroenterology

## 2023-01-28 VITALS — BP 159/79 | HR 79 | Temp 97.6°F | Ht <= 58 in | Wt 119.0 lb

## 2023-01-28 DIAGNOSIS — E1142 Type 2 diabetes mellitus with diabetic polyneuropathy: Secondary | ICD-10-CM | POA: Insufficient documentation

## 2023-01-28 DIAGNOSIS — R112 Nausea with vomiting, unspecified: Secondary | ICD-10-CM | POA: Diagnosis not present

## 2023-01-28 DIAGNOSIS — M2041 Other hammer toe(s) (acquired), right foot: Secondary | ICD-10-CM | POA: Insufficient documentation

## 2023-01-28 DIAGNOSIS — L84 Corns and callosities: Secondary | ICD-10-CM | POA: Insufficient documentation

## 2023-01-28 DIAGNOSIS — K219 Gastro-esophageal reflux disease without esophagitis: Secondary | ICD-10-CM

## 2023-01-28 DIAGNOSIS — B351 Tinea unguium: Secondary | ICD-10-CM | POA: Insufficient documentation

## 2023-01-28 DIAGNOSIS — E119 Type 2 diabetes mellitus without complications: Secondary | ICD-10-CM | POA: Insufficient documentation

## 2023-01-28 DIAGNOSIS — M2012 Hallux valgus (acquired), left foot: Secondary | ICD-10-CM | POA: Insufficient documentation

## 2023-01-28 NOTE — Progress Notes (Signed)
GI Office Note    Referring Provider: Carylon Perches, MD Primary Care Physician:  Carylon Perches, MD  Primary Gastroenterologist: Hennie Duos. Marletta Lor, DO   Chief Complaint   Chief Complaint  Patient presents with   Follow-up    Having issues with vomiting, thinks it may be related to ozempic. Hasn't taken in 3 weeks. States that she only has the issue with vomiting when she eats several different types of food at once like at a potluck.     History of Present Illness   Tonya Hall is a 72 y.o. female presenting today for post-procedure follow up. Last seen 09/2022. She has h/o GERD, gastritis, collagenous colitis. She has been chronically on low-dose prednisone for years for psoriatic arthritis, unable to be weaned off due to adrenal insufficiency, followed by Dr. Romero Belling with endocrinology.   She describes four episodes since 07/2022 where she develops N/V lasting up to several days at a time. Symptoms usually happen after eating different things or increased quantities due to activities outside of her daily norm. First episode happened in April when traveling and eating more fast food than usual. Second episode occurred in 10-19-2022 after her father died and her husband was acutely ill requiring surgery for bowel obstruction/perforation. She had food brought to the house. Eating things she normal would not eat or didn't know how food prepared. Increased stress. Last time occurred after weekend of wedding festivities followed by church social. She did not feel she necessarily overage but did eat a variety of different foods and sweets. Last episode also occurring after her son attempted suicide.    She notes having been on ozempic for two years. Each time she reached the "donut hole" and had pause in treatment for two months pending patient assistance approval. Her dose has never been more than 0.5mg . she typically takes her shot on Thursday/Friday. She has pending appt with her endocrinologist to  discuss if she should stay on ozempic. At this time she had held her dose for 3 weeks due to illness.    Gallbladder remains in situ. She does have bilateral flank/upper abdominal pain but notes it feels like this is radiated/originating from her back. There is no postprandial component. No heartburn on pantoprazole BID. BMs regular. No melena. No brbpr. No dysphagia.      EGD 11/2022: -small hiatal hernia -mild Schatzki ring s/p dilation -gastritis, no h.pylori  Colonoscopy 04/2016: -distal ileum norma -internal/external hemorrhoids -repeat colonoscopy 10 years.   Medications   Current Outpatient Medications  Medication Sig Dispense Refill   albuterol (PROVENTIL HFA;VENTOLIN HFA) 108 (90 BASE) MCG/ACT inhaler Inhale 2 puffs into the lungs every 6 (six) hours as needed for wheezing or shortness of breath.      Apremilast (OTEZLA) 30 MG TABS Take 1 tablet (30 mg total) by mouth 2 (two) times daily. 180 tablet 0   aspirin 81 MG tablet Take 81 mg by mouth daily.     Blood Glucose Monitoring Suppl (ONETOUCH VERIO FLEX SYSTEM) w/Device KIT      cetirizine (ZYRTEC) 10 MG tablet Take 10 mg by mouth daily as needed (allergies).     chlorhexidine (PERIDEX) 0.12 % solution Use as directed 15 mLs in the mouth or throat at bedtime.     fish oil-omega-3 fatty acids 1000 MG capsule Take 2 capsules (2 g total) by mouth daily. 2-4 GRAMS DAILY (Patient taking differently: Take 1 g by mouth 2 (two) times daily. 2-4 GRAMS DAILY)  gabapentin (NEURONTIN) 600 MG tablet Take 1 tablet (600 mg total) by mouth 3 (three) times daily. For nerve pain 90 tablet 5   Ginger, Zingiber officinalis, (GINGER ROOT) 500 MG CAPS Take 500 mg by mouth daily.     Glucosamine Sulfate 1000 MG CAPS Take 2,000 mg by mouth daily.     HYDROcodone-acetaminophen (NORCO/VICODIN) 5-325 MG tablet Take 1 tablet by mouth 3 (three) times daily as needed. 90 tablet 0   hydrocortisone sodium succinate (SOLU-CORTEF) 100 MG SOLR injection  Inject 100 mg into the muscle daily as needed (when unable to keep down prednisone tablet).      Ixekizumab (TALTZ) 80 MG/ML SOAJ Inject 80 mg into the skin every 28 (twenty-eight) days. 3 mL 0   Lancets (ONETOUCH DELICA PLUS LANCET33G) MISC USE TO TEST 3 TIMESUDAILY.     leflunomide (ARAVA) 20 MG tablet TAKE 1 TABLET BY MOUTH ONCE DAILY. 90 tablet 0   losartan (COZAAR) 100 MG tablet Take 100 mg by mouth daily.     metoprolol succinate (TOPROL-XL) 50 MG 24 hr tablet Take 1 tablet (50 mg total) by mouth daily. Take with or immediately following a meal.     Misc Natural Products (TART CHERRY ADVANCED PO) Take 1 tablet by mouth daily. daily     Multiple Vitamin (MULTIVITAMIN) tablet Take 1 tablet by mouth 3 (three) times a week.     nortriptyline (PAMELOR) 25 MG capsule TAKE (1) CAPSULE BY MOUTH AT BEDTIME. 30 capsule 0   ONETOUCH VERIO test strip      oxybutynin (DITROPAN-XL) 5 MG 24 hr tablet Take 1 tablet (5 mg total) by mouth at bedtime. 90 tablet 3   pantoprazole (PROTONIX) 40 MG tablet Take 1 tablet (40 mg total) by mouth 2 (two) times daily before a meal. 30 minutes prior 180 tablet 3   Polyethyl Glycol-Propyl Glycol (SYSTANE OP) Place 1 drop into both eyes 4 (four) times daily as needed (dry eyes).     predniSONE (DELTASONE) 5 MG tablet TAKE 1 TABLET BY MOUTH DAILY WITH BREAKFAST 90 tablet 0   spironolactone (ALDACTONE) 25 MG tablet Take 25 mg by mouth daily.     topiramate (TOPAMAX) 200 MG tablet TAKE (1) TABLET BY MOUTH ONCE AT BEDTIME. 90 tablet 0   Turmeric 500 MG CAPS Take 500 mg by mouth every morning.     OZEMPIC, 0.25 OR 0.5 MG/DOSE, 2 MG/3ML SOPN Inject 0.5 mg into the skin every Thursday. (Patient not taking: Reported on 01/23/2023)     No current facility-administered medications for this visit.    Allergies   Allergies as of 01/28/2023 - Review Complete 01/28/2023  Allergen Reaction Noted   Adalimumab Rash and Other (See Comments) 05/09/2021   Imuran [azathioprine sodium]  Rash 07/03/2011   Methotrexate Other (See Comments) 05/09/2021   Plaquenil [hydroxychloroquine sulfate] Rash 07/03/2011   Sulfonamide derivatives Rash    Azathioprine  05/09/2021   Ceftin [cefuroxime axetil] Nausea Only 07/06/2014   Cefuroxime  05/09/2021   Lisinopril  09/22/2021   Sulfamethoxazole-trimethoprim  09/22/2021   Morphine Nausea And Vomiting        Review of Systems   General: Negative for anorexia, unintentional weight loss, fever, chills, fatigue, weakness. ENT: Negative for hoarseness, difficulty swallowing , nasal congestion. CV: Negative for chest pain, angina, palpitations, dyspnea on exertion, peripheral edema.  Respiratory: Negative for dyspnea at rest, dyspnea on exertion, cough, sputum, wheezing.  GI: See history of present illness. GU:  Negative for dysuria, hematuria, urinary incontinence, urinary  frequency, nocturnal urination.  Endo: Negative for unusual weight change.     Physical Exam   BP (!) 159/79 (BP Location: Right Arm, Patient Position: Sitting, Cuff Size: Normal)   Pulse 79   Temp 97.6 F (36.4 C) (Oral)   Ht 4\' 10"  (1.473 m)   Wt 119 lb (54 kg)   LMP 02/01/1999   SpO2 100%   BMI 24.87 kg/m    General: Well-nourished, well-developed in no acute distress.  Eyes: No icterus. Mouth: Oropharyngeal mucosa moist and pink  Abdomen: Bowel sounds are normal, nontender, nondistended, no hepatosplenomegaly or masses,  no abdominal bruits or hernia , no rebound or guarding.  Rectal: not performed  Extremities: No lower extremity edema. No clubbing or deformities. Neuro: Alert and oriented x 4   Skin: Warm and dry, no jaundice.   Psych: Alert and cooperative, normal mood and affect.  Labs   Lab Results  Component Value Date   NA 134 (L) 12/20/2022   CL 102 12/20/2022   K 3.6 12/20/2022   CO2 23 12/20/2022   BUN 29 (H) 12/20/2022   CREATININE 0.92 12/20/2022   EGFR 66 12/20/2022   CALCIUM 9.3 12/20/2022   PHOS 2.2 (L) 02/10/2019    ALBUMIN 4.4 12/21/2020   GLUCOSE 107 (H) 12/20/2022   Lab Results  Component Value Date   ALT 22 12/20/2022   AST 18 12/20/2022   ALKPHOS 70 12/21/2020   BILITOT 0.3 12/20/2022   Lab Results  Component Value Date   WBC 11.3 (H) 12/20/2022   HGB 12.5 12/20/2022   HCT 38.8 12/20/2022   MCV 97.7 12/20/2022   PLT 229 12/20/2022    Imaging Studies   No results found.  Assessment/Plan:   GERD -well controlled on pantoprazole BID  N/V -episodic, usually occurring on the weekends, associated with eating outside of her normal, increased stressful situations -interestingly most recent episodes occurring within 24-48 hours of Ozempic.  -may be due to delayed gastric emptying  -encouragingly, at other times she has no symptoms -she will discuss ongoing use with endocrinology. If stays on Ozempic, consider taking on Mondays to allow longer time between shots and social events.  -encourage reducing portion sizes, avoiding high fat/high carb foods.  -if ongoing symptoms off ozempic, then would consider CBC, CMET, lipase with next episode and consider CT imaging.   Otherwise return to office in one year.      Leanna Battles. Melvyn Neth, MHS, PA-C [facility]

## 2023-01-28 NOTE — Patient Instructions (Addendum)
Continue pantoprazole 40mg  twice daily before a meal.   If you plan to continue ozempic, try giving your dosage on Mondays to allow more time to pass between your shot and your social events. Continue to try and limit portion sizes, fat and carbs to help avoid potential episodes of Nausea and vomiting.   If you continue to have episodes of nausea/vomiting, especially if you are off Ozempic, then we will need to consider updating labs and possible CT scan with your next episode. I will provide you lab orders to have on hand in the event of recurrent symptoms, would have you complete labs as close to 6-8 hours after onset of symptoms.   Otherwise, if you are doing well, you can return for follow up in one year.

## 2023-02-01 ENCOUNTER — Other Ambulatory Visit: Payer: Self-pay | Admitting: Gastroenterology

## 2023-02-06 DIAGNOSIS — E1121 Type 2 diabetes mellitus with diabetic nephropathy: Secondary | ICD-10-CM | POA: Diagnosis not present

## 2023-02-06 DIAGNOSIS — E119 Type 2 diabetes mellitus without complications: Secondary | ICD-10-CM | POA: Diagnosis not present

## 2023-02-06 DIAGNOSIS — M810 Age-related osteoporosis without current pathological fracture: Secondary | ICD-10-CM | POA: Diagnosis not present

## 2023-02-12 ENCOUNTER — Telehealth: Payer: Self-pay

## 2023-02-12 NOTE — Telephone Encounter (Signed)
Pt called stating that she started having an episode of vomiting on Sat and was unable to get to the lab in the requested amount of time to have labs completed. Pt is wanting to know if she should still get labs or if you want to order some kind of imaging to see if something is wrong with her gallbladder. Pt is still having issues with vomiting.

## 2023-02-12 NOTE — Telephone Encounter (Signed)
Would like for her to still get labs if ongoing vomiting.  We can order ruq u/s. Dx: n/v, upper abd pain

## 2023-02-13 NOTE — Telephone Encounter (Signed)
Pt was made aware and verbalized understanding.  

## 2023-02-14 ENCOUNTER — Other Ambulatory Visit: Payer: Self-pay

## 2023-02-14 ENCOUNTER — Telehealth: Payer: Self-pay

## 2023-02-14 NOTE — Telephone Encounter (Signed)
Auth Submission: NO AUTH NEEDED Site of care: Site of care: [facility] Payer: [employer] Medication & CPT/J Code(s) submitted: Prolia (Denosumab) [id number] Route of submission (phone, fax, portal): phone Phone # Fax # Auth type: Buy/Bill PB Units/visits requested: 60mg , q6 monthsh Reference number: [id number] Approval from: 02/14/23 to 04/02/23

## 2023-02-16 ENCOUNTER — Other Ambulatory Visit: Payer: Self-pay | Admitting: Physician Assistant

## 2023-02-16 DIAGNOSIS — L4050 Arthropathic psoriasis, unspecified: Secondary | ICD-10-CM

## 2023-02-16 DIAGNOSIS — Z79899 Other long term (current) drug therapy: Secondary | ICD-10-CM

## 2023-02-16 DIAGNOSIS — L409 Psoriasis, unspecified: Secondary | ICD-10-CM

## 2023-02-18 ENCOUNTER — Other Ambulatory Visit: Payer: Self-pay | Admitting: *Deleted

## 2023-02-18 DIAGNOSIS — R1010 Upper abdominal pain, unspecified: Secondary | ICD-10-CM

## 2023-02-18 DIAGNOSIS — R112 Nausea with vomiting, unspecified: Secondary | ICD-10-CM

## 2023-02-18 NOTE — Telephone Encounter (Signed)
Pt informed that [place] is scheduled for 02/21/23, arrive at 9:45 am to check in, NPO after midnight.

## 2023-02-18 NOTE — Telephone Encounter (Signed)
Pt would like to move forward with scheduling [place].

## 2023-02-20 ENCOUNTER — Encounter: Payer: PPO | Attending: Endocrinology | Admitting: Internal Medicine

## 2023-02-20 ENCOUNTER — Telehealth: Payer: Self-pay | Admitting: Pharmacist

## 2023-02-20 ENCOUNTER — Telehealth: Payer: Self-pay

## 2023-02-20 ENCOUNTER — Encounter: Payer: Self-pay | Admitting: Physical Medicine and Rehabilitation

## 2023-02-20 ENCOUNTER — Telehealth: Payer: Self-pay | Admitting: Registered Nurse

## 2023-02-20 VITALS — BP 153/71 | HR 83 | Temp 97.4°F | Resp 16

## 2023-02-20 DIAGNOSIS — M810 Age-related osteoporosis without current pathological fracture: Secondary | ICD-10-CM | POA: Diagnosis not present

## 2023-02-20 MED ORDER — HYDROCODONE-ACETAMINOPHEN 5-325 MG PO TABS: 1.0000 | ORAL_TABLET | Freq: Three times a day (TID) | ORAL | 0 refills | Status: DC | PRN

## 2023-02-20 MED ORDER — DENOSUMAB 60 MG/ML ~~LOC~~ SOSY
60.0000 mg | PREFILLED_SYRINGE | Freq: Once | SUBCUTANEOUS | Status: AC
  Administered 2023-02-20: 60 mg via SUBCUTANEOUS

## 2023-02-20 NOTE — Progress Notes (Signed)
Diagnosis: Osteoporosis  Provider:   Dennie Maizes, NP  Procedure: Injection  Prolia (Denosumab), Dose: 60 mg, Site: subcutaneous, Number of injections: 1  Post Care: Observation period completed  Discharge: Condition: Good, Destination: Home . AVS Provided  Performed by:  Cleotilde Neer, LPN

## 2023-02-20 NOTE — Telephone Encounter (Signed)
I called and spoke with Tonya Hall and she has had some days where she has been sick with possibly gallbladder issues but generally she takes 1 1/2 tablet bid as instructed by Dr Berline Chough. Please send to [facility]

## 2023-02-20 NOTE — Telephone Encounter (Signed)
Patient requesting refill on hydrocodone last fill per pmp    12/19/2022 12/19/2022 1  Hydrocodone-Acetamin 5-325 Mg 90.00 30 Me Lov [id number] Car ([id number]) 0/0 15.00 MME Comm Ins [place]

## 2023-02-20 NOTE — Telephone Encounter (Signed)
PMP was Reviewed.  Hydrocodone e-scribed to pharmacy.  Sybil RN spoke with Ms. Herrera regarding the above.

## 2023-02-20 NOTE — Telephone Encounter (Signed)
Patient reached out regarding Taltz renewal application. [employer] application mailed to patient  She states she does not qualify for PAP for [employer] [place] and may consider enrolling into Medicare prescription payment plan.   Chesley Mires, PharmD, MPH, BCPS, CPP Clinical Pharmacist (Rheumatology and Pulmonology)

## 2023-02-21 ENCOUNTER — Ambulatory Visit (HOSPITAL_COMMUNITY)
Admission: RE | Admit: 2023-02-21 | Discharge: 2023-02-21 | Disposition: A | Discharge status: Home / Self Care | Payer: PPO | Source: Ambulatory Visit | Attending: Gastroenterology | Admitting: Gastroenterology

## 2023-02-21 DIAGNOSIS — R112 Nausea with vomiting, unspecified: Secondary | ICD-10-CM | POA: Insufficient documentation

## 2023-02-21 DIAGNOSIS — K7689 Other specified diseases of liver: Secondary | ICD-10-CM | POA: Diagnosis not present

## 2023-02-21 DIAGNOSIS — R1010 Upper abdominal pain, unspecified: Secondary | ICD-10-CM | POA: Insufficient documentation

## 2023-02-21 DIAGNOSIS — R1011 Right upper quadrant pain: Secondary | ICD-10-CM | POA: Diagnosis not present

## 2023-02-21 MED ORDER — HYDROCODONE-ACETAMINOPHEN 5-325 MG PO TABS: 1.0000 | ORAL_TABLET | Freq: Three times a day (TID) | ORAL | 0 refills | Status: DC | PRN

## 2023-02-21 NOTE — Telephone Encounter (Signed)
Received signed provider form from Dr. Corliss Skains for patient's [employer] application  Submitted a Prior Authorization request to [facility] ADVANTAGE/RX ADVANCE for TALTZ via CoverMyMeds. Will update once we receive a response.  Key: [id number]  Chesley Mires, PharmD, MPH, BCPS, CPP Clinical Pharmacist (Rheumatology and Pulmonology)

## 2023-02-22 NOTE — Telephone Encounter (Signed)
Received notification from [facility] ADVANTAGE/RX ADVANCE regarding a prior authorization for TALTZ. Authorization has been APPROVED from 02/21/23 to 04/01/24. Approval letter sent to scan center.  Authorization # [id number]  Will need to attach to PAP renewal application  Chesley Mires, PharmD, MPH, BCPS, CPP Clinical Pharmacist (Rheumatology and Pulmonology)

## 2023-02-23 ENCOUNTER — Other Ambulatory Visit: Payer: Self-pay | Admitting: Physician Assistant

## 2023-02-25 NOTE — Telephone Encounter (Addendum)
Provider portion, PA letter, insurance card copy, and med list scanned to onbase for Taltz PAP renewal submission once patient portion is returned

## 2023-02-25 NOTE — Telephone Encounter (Signed)
Last Fill: 10/26/2022  Labs: 12/20/2022 White cell count is elevated most likely due to steroid use.  Sodium is low and stable.   Next Visit: 05/14/2022  Last Visit: 12/20/2022  DX: Psoriatic arthritis   Current Dose per office note 12/20/2022: Arava 20mg  1 tablet daily   Okay to refill Arava ?

## 2023-03-11 ENCOUNTER — Telehealth: Payer: Self-pay | Admitting: Gastroenterology

## 2023-03-11 ENCOUNTER — Encounter: Payer: Self-pay | Admitting: Home Modifications

## 2023-03-11 NOTE — Telephone Encounter (Signed)
Please schedule ov with Dr. Marletta Lor for Wednesday. Ongoing abd pain, n/v.

## 2023-03-12 ENCOUNTER — Other Ambulatory Visit: Payer: Self-pay | Admitting: Gastroenterology

## 2023-03-12 DIAGNOSIS — M810 Age-related osteoporosis without current pathological fracture: Secondary | ICD-10-CM | POA: Diagnosis not present

## 2023-03-12 DIAGNOSIS — E119 Type 2 diabetes mellitus without complications: Secondary | ICD-10-CM | POA: Diagnosis not present

## 2023-03-12 DIAGNOSIS — R112 Nausea with vomiting, unspecified: Secondary | ICD-10-CM | POA: Diagnosis not present

## 2023-03-12 DIAGNOSIS — K219 Gastro-esophageal reflux disease without esophagitis: Secondary | ICD-10-CM | POA: Diagnosis not present

## 2023-03-13 ENCOUNTER — Ambulatory Visit (INDEPENDENT_AMBULATORY_CARE_PROVIDER_SITE_OTHER): Payer: PPO | Admitting: Internal Medicine

## 2023-03-13 ENCOUNTER — Encounter: Payer: Self-pay | Admitting: Internal Medicine

## 2023-03-13 VITALS — BP 151/75 | HR 73 | Temp 97.6°F | Ht <= 58 in | Wt 122.9 lb

## 2023-03-13 DIAGNOSIS — R112 Nausea with vomiting, unspecified: Secondary | ICD-10-CM | POA: Diagnosis not present

## 2023-03-13 DIAGNOSIS — K219 Gastro-esophageal reflux disease without esophagitis: Secondary | ICD-10-CM

## 2023-03-13 DIAGNOSIS — R1010 Upper abdominal pain, unspecified: Secondary | ICD-10-CM | POA: Diagnosis not present

## 2023-03-13 LAB — LIPASE: Lipase: 44 U/L (ref 14–85)

## 2023-03-13 NOTE — Progress Notes (Unsigned)
Referring Provider: Carylon Perches, MD Primary Care Physician:  Carylon Perches, MD Primary GI:  Dr. Marletta Lor  Chief Complaint  Patient presents with   Abdominal Pain    Follow up on abdominal pain, nausea, vomiting, and loose stools.  Off and on for about one year. Now having episode about every 3 weeks that last 3 -5 days.     HPI:   Tonya Hall is a 72 y.o. female who presents to clinic today for problem visit for nausea and vomiting.  Patient states every 1 to 2 months she will have significant episode of nausea vomiting that last approximately 3 to 4 days.  This will be accompanied by diarrhea as well.  Also notes associated abdominal pain primarily epigastric/right upper quadrant.  In between episodes she will have no GI symptoms at all.  Chronically taking Ozempic though this was recently stopped approximately 4 weeks ago.  Denies any marijuana use.  No melena hematochezia.  RUQ [place] 02/21/2023 with hepatic steatosis otherwise unremarkable.  EGD 11/2022: -small hiatal hernia -mild Schatzki ring s/p dilation -gastritis, no h.pylori   Colonoscopy 04/2016: -distal ileum norma -internal/external hemorrhoids -repeat colonoscopy 10 years.   Past Medical History:  Diagnosis Date   Asthma    Albuterol in haler prn   Cataract    immature unsure which eye   Chronic back pain    COVID-19 October/November 2020   Degenerative disk disease    psoriatic   Diabetes mellitus without complication (HCC)    diet controlled   Esophageal motility disorder    Non-specific, see modified barium study/speech path, BP   Fibromyalgia    GERD (gastroesophageal reflux disease)    takes Protonix bid   Headache 2019   History of blood transfusion    post c-section   History of bronchitis    HTN (hypertension)    Hyperlipidemia    takes Pravastatin daily   Lymphocytic colitis 05/26/2010   Responded to Entocort x 3 MOS   Neuropathy    Nonallergic rhinitis    Osteoporosis    gets Boniva every  3 months   Peripheral edema    takes HCTZ daily   Pneumonia 02/2019   Psoriatic arthritis (HCC)    Dr. Anthonette Legato   Psoriatic arthritis [facility])    bilateral hands   Raynaud's disease    Seasonal allergies    Shingles 2020   SUI (stress urinary incontinence, female)    Urinary urgency     Past Surgical History:  Procedure Laterality Date   ANTERIOR CERVICAL DECOMP/DISCECTOMY FUSION N/A 03/09/2020   Procedure: ANTERIOR CERVICAL DECOMPRESSION/DISCECTOMY FUSION, INTERBODY PROSTHESIS, PLATE SCREWS CERVICAL FIVE-SIX, CERVICAL SIX-SEVEN;  Surgeon: Tressie Stalker, MD;  Location: [facility] OR;  Service: Neurosurgery;  Laterality: N/A;   BACK SURGERY  2003   BALLOON DILATION N/A 11/12/2022   Procedure: BALLOON DILATION;  Surgeon: Lanelle Bal, DO;  Location: [facility];  Service: Endoscopy;  Laterality: N/A;   BIOPSY  04/17/2016   Procedure: BIOPSY;  Surgeon: West Bali, MD;  Location: [facility];  Service: Endoscopy;;  random colon bx's   BIOPSY  11/12/2022   Procedure: BIOPSY;  Surgeon: Lanelle Bal, DO;  Location: [facility];  Service: Endoscopy;;   CERVICAL SPINE SURGERY  09/09/2017   CESAREAN SECTION  1974, 1978       COLONOSCOPY  06/19/2008   VHQ:IONGE internal hemorrhoids/mild sigmoin colon diverticulosis   COLONOSCOPY  2012   Dr. Jena Gauss: normal rectum, diverticula, lymphocytic colitis  COLONOSCOPY WITH PROPOFOL N/A 04/17/2016   Dr. Darrick Penna: Normal terminal ileum, internal/external hemorrhoids, random colon biopsies consistent with collagenous colitis   DILATION AND CURETTAGE OF UTERUS  1977   spontaneous abortion   ESOPHAGOGASTRODUODENOSCOPY  06/19/2008   BJY:NWGNFAOZ gastritis   ESOPHAGOGASTRODUODENOSCOPY (EGD) WITH PROPOFOL N/A 11/12/2022   Procedure: ESOPHAGOGASTRODUODENOSCOPY (EGD) WITH PROPOFOL;  Surgeon: Lanelle Bal, DO;  Location: [facility];  Service: Endoscopy;  Laterality: N/A;  8:30 am, asa 3   LUMBAR DISC ARTHROPLASTY  09/1996   LUMBAR FUSION   6/03, 11/08, 11/14   L4-5 fusion, L2-3 fusion, L1-2   LUMBAR FUSION  08/03/2020   L4-L5   LUMBAR LAMINECTOMY  03/2020   MOUTH SURGERY  12/07/2021   oral surgery   ODONTOID SCREW INSERTION N/A 01/27/2018   Procedure: ODONTOID SCREW INSERTION;  Surgeon: Tressie Stalker, MD;  Location[facility] OR;  Service: Neurosurgery;  Laterality: N/A;  ODONTOID SCREW INSERTION   TONSILLECTOMY     TONSILLECTOMY AND ADENOIDECTOMY     TUBAL LIGATION      Current Outpatient Medications  Medication Sig Dispense Refill   albuterol (PROVENTIL HFA;VENTOLIN HFA) 108 (90 BASE) MCG/ACT inhaler Inhale 2 puffs into the lungs every 6 (six) hours as needed for wheezing or shortness of breath.      Apremilast (OTEZLA) 30 MG TABS Take 1 tablet (30 mg total) by mouth 2 (two) times daily. 180 tablet 0   aspirin 81 MG tablet Take 81 mg by mouth daily.     Blood Glucose Monitoring Suppl (ONETOUCH VERIO FLEX SYSTEM) w/Device KIT      cetirizine (ZYRTEC) 10 MG tablet Take 10 mg by mouth daily as needed (allergies).     fish oil-omega-3 fatty acids 1000 MG capsule Take 2 capsules (2 g total) by mouth daily. 2-4 GRAMS DAILY (Patient taking differently: Take 1 g by mouth 2 (two) times daily. 2-4 GRAMS DAILY)     gabapentin (NEURONTIN) 600 MG tablet Take 1 tablet (600 mg total) by mouth 3 (three) times daily. For nerve pain 90 tablet 5   Ginger, Zingiber officinalis, (GINGER ROOT) 500 MG CAPS Take 500 mg by mouth daily.     Glucosamine Sulfate 1000 MG CAPS Take 2,000 mg by mouth daily.     HYDROcodone-acetaminophen (NORCO/VICODIN) 5-325 MG tablet Take 1 tablet by mouth 3 (three) times daily as needed. 90 tablet 0   hydrocortisone sodium succinate (SOLU-CORTEF) 100 MG SOLR injection Inject 100 mg into the muscle daily as needed (when unable to keep down prednisone tablet).      Ixekizumab (TALTZ) 80 MG/ML SOAJ Inject 80 mg into the skin every 28 (twenty-eight) days. 3 mL 0   Lancets (ONETOUCH DELICA PLUS LANCET33G) MISC USE TO TEST 3  TIMESUDAILY.     leflunomide (ARAVA) 20 MG tablet TAKE 1 TABLET BY MOUTH ONCE DAILY. 90 tablet 0   losartan (COZAAR) 100 MG tablet Take 100 mg by mouth daily.     metoprolol succinate (TOPROL-XL) 50 MG 24 hr tablet Take 1 tablet (50 mg total) by mouth daily. Take with or immediately following a meal.     Misc Natural Products (TART CHERRY ADVANCED PO) Take 1 tablet by mouth daily. daily     Multiple Vitamin (MULTIVITAMIN) tablet Take 1 tablet by mouth 3 (three) times a week.     nortriptyline (PAMELOR) 25 MG capsule TAKE (1) CAPSULE BY MOUTH AT BEDTIME. 30 capsule 0   ONETOUCH VERIO test strip      oxybutynin (DITROPAN-XL) 5 MG  24 hr tablet Take 1 tablet (5 mg total) by mouth at bedtime. 90 tablet 3   pantoprazole (PROTONIX) 40 MG tablet TAKE (1) TABLET BY MOUTH TWICE DAILY. 180 tablet 3   Polyethyl Glycol-Propyl Glycol (SYSTANE OP) Place 1 drop into both eyes 4 (four) times daily as needed (dry eyes).     predniSONE (DELTASONE) 5 MG tablet TAKE 1 TABLET BY MOUTH DAILY WITH BREAKFAST 90 tablet 0   spironolactone (ALDACTONE) 25 MG tablet Take 25 mg by mouth daily.     topiramate (TOPAMAX) 200 MG tablet TAKE (1) TABLET BY MOUTH ONCE AT BEDTIME. 90 tablet 0   Turmeric 500 MG CAPS Take 500 mg by mouth every morning.     No current facility-administered medications for this visit.    Allergies as of 03/13/2023 - Review Complete 03/13/2023  Allergen Reaction Noted   Adalimumab Rash and Other (See Comments) 05/09/2021   Imuran [azathioprine sodium] Rash 07/03/2011   Methotrexate Other (See Comments) 05/09/2021   Plaquenil [hydroxychloroquine sulfate] Rash 07/03/2011   Sulfonamide derivatives Rash    Azathioprine  05/09/2021   Ceftin [cefuroxime axetil] Nausea Only 07/06/2014   Cefuroxime  05/09/2021   Lisinopril  09/22/2021   Sulfamethoxazole-trimethoprim  09/22/2021   Morphine Nausea And Vomiting     Family History  Problem Relation Age of Onset   Diabetes Mother    Pancreatitis Mother     Arthritis Mother        psoriatic    Fibromyalgia Mother    Rheumatic fever Father    Diabetes Other        grandparents   Skin cancer Other        grandfather   Hypertension Other        grandparent   Congestive Heart Failure Other        grandfather   Parkinson's disease Other        grandmother   Colon cancer Maternal Uncle    Fibromyalgia Sister    Rheum arthritis Sister    Diabetes Sister    Fibromyalgia Sister    Diabetes Sister    Rheum arthritis Sister    Hypertension Son    Colon polyps Neg Hx     Social History   Socioeconomic History   Marital status: Married    Spouse name: Not on file   Number of children: Not on file   Years of education: Not on file   Highest education level: Not on file  Occupational History   Occupation: Case Midwife    Employer: [employer]    Comment: Estate manager/land agent  Tobacco Use   Smoking status: Never    Passive exposure: Never   Smokeless tobacco: Never  Vaping Use   Vaping status: Never Used  Substance and Sexual Activity   Alcohol use: No   Drug use: No   Sexual activity: Not Currently    Partners: Male    Birth control/protection: Surgical    Comment: BTL  Other Topics Concern   Not on file  Social History Narrative   ATTENDS [employer]. 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   Social Determinants of Health   Financial Resource Strain: Not on file  Food Insecurity: Not on file  Transportation Needs: Not on file  Physical Activity: Not on file  Stress: Not on file  Social Connections: Not on file    Subjective: Review of Systems  Constitutional:  Negative for chills and fever.  HENT:  Negative for congestion and hearing loss.  Eyes:  Negative for blurred vision and double vision.  Respiratory:  Negative for cough and shortness of breath.   Cardiovascular:  Negative for chest pain and palpitations.  Gastrointestinal:  Positive for abdominal pain, heartburn and nausea. Negative for blood in stool,  constipation, diarrhea, melena and vomiting.  Genitourinary:  Negative for dysuria and urgency.  Musculoskeletal:  Negative for joint pain and myalgias.  Skin:  Negative for itching and rash.  Neurological:  Negative for dizziness and headaches.  Psychiatric/Behavioral:  Negative for depression. The patient is not nervous/anxious.      Objective: BP (!) 151/75 (BP Location: Left Arm, Patient Position: Sitting, Cuff Size: Normal)   Pulse 73   Temp 97.6 F (36.4 C) (Oral)   Ht 4\' 10"  (1.473 m)   Wt 122 lb 14.4 oz (55.7 kg)   LMP 02/01/1999   BMI 25.69 kg/m  Physical Exam Constitutional:      Appearance: Normal appearance.  HENT:     Head: Normocephalic and atraumatic.  Eyes:     Extraocular Movements: Extraocular movements intact.     Conjunctiva/sclera: Conjunctivae normal.  Cardiovascular:     Rate and Rhythm: Normal rate and regular rhythm.  Pulmonary:     Effort: Pulmonary effort is normal.     Breath sounds: Normal breath sounds.  Abdominal:     General: Bowel sounds are normal.     Palpations: Abdomen is soft.  Musculoskeletal:        General: No swelling. Normal range of motion.     Cervical back: Normal range of motion and neck supple.  Skin:    General: Skin is warm and dry.     Coloration: Skin is not jaundiced.  Neurological:     General: No focal deficit present.     Mental Status: She is alert and oriented to person, place, and time.  Psychiatric:        Mood and Affect: Mood normal.        Behavior: Behavior normal.      Assessment: *Nausea and vomiting *Upper abdominal pain *Chronic GERD  Plan: Etiology of patient's symptoms unclear.  Ultrasound unremarkable.  Will pursue HIDA scan to further evaluate to rule out biliary colic.  Continue pantoprazole twice daily.  Would continue to hold Ozempic for now to see if her symptoms are possibly medication induced.  Cyclic vomiting syndrome also on differential.  Follow-up in 4 weeks  03/13/2023  2:33 PM   Disclaimer: This note was dictated with voice recognition software. Similar sounding words can inadvertently be transcribed and may not be corrected upon review.

## 2023-03-13 NOTE — Patient Instructions (Signed)
I am going to order a HIDA scan to further evaluate your gallbladder.  Would continue to hold your Ozempic for now.  If still having recurrent nausea and vomiting despite this medication, we have at least eliminated this as a potential cause.  Follow-up with me in 4 weeks.  It was very nice seeing you again today.  Dr. Marletta Lor

## 2023-03-14 ENCOUNTER — Telehealth: Payer: Self-pay | Admitting: Rheumatology

## 2023-03-14 ENCOUNTER — Encounter (INDEPENDENT_AMBULATORY_CARE_PROVIDER_SITE_OTHER): Payer: Self-pay

## 2023-03-14 NOTE — Telephone Encounter (Signed)
Pt called wanting to speak with the pharmacist. Pt states it is about paperwork for a medication. Call back number is [phone]

## 2023-03-18 ENCOUNTER — Telehealth: Payer: Self-pay | Admitting: Rheumatology

## 2023-03-18 DIAGNOSIS — M5414 Radiculopathy, thoracic region: Secondary | ICD-10-CM | POA: Diagnosis not present

## 2023-03-18 NOTE — Telephone Encounter (Signed)
Patient wanted to clarify if [employer] PAP needed to be mailed to program or clinic. I requested app be mailed to clinic and we will submit with MD form  She plans to pay for [place] through insurance in 2025 and is aware of $2000 max OOP for rx drugs processed through insurance. She plans to notify office when she has about 2 weeks of Otezla remaining  Chesley Mires, PharmD, MPH, BCPS, CPP Clinical Pharmacist (Rheumatology and Pulmonology)

## 2023-03-18 NOTE — Telephone Encounter (Signed)
Pt left a message requesting the pharmacist to call her back at [phone].

## 2023-03-18 NOTE — Telephone Encounter (Signed)
Addressed in alternate phone encoiunter

## 2023-03-19 ENCOUNTER — Other Ambulatory Visit (HOSPITAL_COMMUNITY): Payer: PPO

## 2023-03-19 DIAGNOSIS — E119 Type 2 diabetes mellitus without complications: Secondary | ICD-10-CM | POA: Diagnosis not present

## 2023-03-19 DIAGNOSIS — E7800 Pure hypercholesterolemia, unspecified: Secondary | ICD-10-CM | POA: Diagnosis not present

## 2023-03-19 DIAGNOSIS — I10 Essential (primary) hypertension: Secondary | ICD-10-CM | POA: Diagnosis not present

## 2023-03-19 DIAGNOSIS — M810 Age-related osteoporosis without current pathological fracture: Secondary | ICD-10-CM | POA: Diagnosis not present

## 2023-03-20 ENCOUNTER — Encounter: Payer: PPO | Attending: Physical Medicine and Rehabilitation | Admitting: Physical Medicine and Rehabilitation

## 2023-03-20 ENCOUNTER — Encounter: Payer: Self-pay | Admitting: Physical Medicine and Rehabilitation

## 2023-03-20 VITALS — BP 170/84 | HR 101 | Ht <= 58 in | Wt 122.0 lb

## 2023-03-20 DIAGNOSIS — M461 Sacroiliitis, not elsewhere classified: Secondary | ICD-10-CM | POA: Diagnosis not present

## 2023-03-20 DIAGNOSIS — M5134 Other intervertebral disc degeneration, thoracic region: Secondary | ICD-10-CM | POA: Insufficient documentation

## 2023-03-20 DIAGNOSIS — M24549 Contracture, unspecified hand: Secondary | ICD-10-CM | POA: Diagnosis not present

## 2023-03-20 DIAGNOSIS — L4050 Arthropathic psoriasis, unspecified: Secondary | ICD-10-CM | POA: Diagnosis not present

## 2023-03-20 MED ORDER — LIDOCAINE 5 % EX OINT: 1.0000 | TOPICAL_OINTMENT | CUTANEOUS | 5 refills | Status: DC | PRN

## 2023-03-20 MED ORDER — HYDROCODONE-ACETAMINOPHEN 7.5-325 MG PO TABS: 1.0000 | ORAL_TABLET | Freq: Four times a day (QID) | ORAL | 0 refills | Status: DC | PRN

## 2023-03-20 NOTE — Progress Notes (Signed)
Subjective:    Patient ID: Tonya Hall, female    DOB: 1950-11-21, 72 y.o.   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  L hip bursitis and also has SI joint pain more on L- as well as mid thoracic acute on chronic pain.   Here for f/u on chronic pain from back and psoriatic arthritis.     No major changes since I last saw her.   Had injection in thoracic spine last Monday- Dr Lorrine Kin at [facility] office.   Was in SEVERE pain for October to December- all she could do is to move at all- some days, wouldn't get up-   Yesterday AM was great- overdid it yesterday since feeling good.    Now running 3-4/10 this AM, vs 7-8/10 had been running.   The last injection of thoracic spine was somewhat helpful (mainly helped R side, but not overall pain as much as had hoped).  Last did 3 months prior to that.    Having episodes of N/V- was in May, then in July (after husband sick and then father died)- lasted 3-4 days- then every months since August- lasts 3-4 days overall.  Getting HIDA scan Friday trying to figure out what it is. Also has Gallbladder U/S- was negative.  Both sisters and mother had gallbladder out since wasn't functioning.   Vomits 2-3x/day and nausea all day- improves after 4-7 days- but then will also have associated loose stools- lasted longer than last time 1 week ago.   No diet change that she's aware of-  Stopped Ozempic and hasn't had for 6 weeks.   Otherwise, things have been stable, pain wise, except thoracic spine- no increase in SI joint pain or bursitis.   Hands maybe a little.  Has tried Voltaren gel for hands- doesn't work because cannot get it to rub in-   Finally "rolls up to come off".    Pain Inventory Average Pain 8 Pain Right Now 4 My pain is  intermittent, dull, and aching  In the last 24 hours, has pain interfered with the following? General activity 7 Relation with others 6 Enjoyment of life 0 What TIME of day is your pain at its worst? daytime Sleep (in general) Fair  Pain is worse with: bending Pain improves with: rest, heat/ice, and medication Relief from Meds: 4  Family History  Problem Relation Age of Onset   Diabetes Mother    Pancreatitis Mother    Arthritis Mother        psoriatic    Fibromyalgia Mother    Rheumatic fever Father    Diabetes Other        grandparents   Skin cancer Other        grandfather   Hypertension Other        grandparent   Congestive Heart Failure Other        grandfather   Parkinson's disease Other        grandmother   Colon cancer Maternal Uncle    Fibromyalgia Sister    Rheum arthritis Sister    Diabetes Sister    Fibromyalgia Sister    Diabetes Sister    Rheum arthritis Sister    Hypertension Son    Colon polyps Neg Hx  Social History   Socioeconomic History   Marital status: Married    Spouse name: Not on file   Number of children: Not on file   Years of education: Not on file   Highest education level: Not on file  Occupational History   Occupation: Case Mgr RN    Employer: [employer]    Comment: Estate manager/land agent  Tobacco Use   Smoking status: Never    Passive exposure: Never   Smokeless tobacco: Never  Vaping Use   Vaping status: Never Used  Substance and Sexual Activity   Alcohol use: No   Drug use: No   Sexual activity: Not Currently    Partners: Male    Birth control/protection: Surgical    Comment: BTL  Other Topics Concern   Not on file  Social History Narrative   ATTENDS [employer]. 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   Social Drivers of Corporate investment banker Strain: Not on file  Food Insecurity: Not on file  Transportation Needs: Not on file  Physical Activity: Not on file  Stress: Not on file  Social Connections: Not on  file   Past Surgical History:  Procedure Laterality Date   ANTERIOR CERVICAL DECOMP/DISCECTOMY FUSION N/A 03/09/2020   Procedure: ANTERIOR CERVICAL DECOMPRESSION/DISCECTOMY FUSION, INTERBODY PROSTHESIS, PLATE SCREWS CERVICAL FIVE-SIX, CERVICAL SIX-SEVEN;  Surgeon: Tressie Stalker, MD;  Location: [facility] OR;  Service: Neurosurgery;  Laterality: N/A;   BACK SURGERY  2003   BALLOON DILATION N/A 11/12/2022   Procedure: BALLOON DILATION;  Surgeon: Lanelle Bal, DO;  [facility];  Service: Endoscopy;  Laterality: N/A;   BIOPSY  04/17/2016   Procedure: BIOPSY;  Surgeon: West Bali, MD;  Location: [facility];  Service: Endoscopy;;  random colon bx's   BIOPSY  11/12/2022   Procedure: BIOPSY;  Surgeon: Lanelle Bal, DO;  [facility];  Service: Endoscopy;;   CERVICAL SPINE SURGERY  09/09/2017   CESAREAN SECTION  1974, 1978       COLONOSCOPY  06/19/2008   [id number] internal hemorrhoids/mild sigmoin colon diverticulosis   COLONOSCOPY  2012   Dr. Jena Gauss: normal rectum, diverticula, lymphocytic colitis    COLONOSCOPY WITH PROPOFOL N/A 04/17/2016   Dr. Darrick Penna: Normal terminal ileum, internal/external hemorrhoids, random colon biopsies consistent with collagenous colitis   DILATION AND CURETTAGE OF UTERUS  1977   spontaneous abortion   ESOPHAGOGASTRODUODENOSCOPY  06/19/2008   [id number] gastritis   ESOPHAGOGASTRODUODENOSCOPY (EGD) WITH PROPOFOL N/A 11/12/2022   Procedure: ESOPHAGOGASTRODUODENOSCOPY (EGD) WITH PROPOFOL;  Surgeon: Lanelle Bal, DO;  [facility];  Service: Endoscopy;  Laterality: N/A;  8:30 am, asa 3   LUMBAR DISC ARTHROPLASTY  09/1996   LUMBAR FUSION  6/03, 11/08, 11/14   L4-5 fusion, L2-3 fusion, L1-2   LUMBAR FUSION  08/03/2020   L4-L5   LUMBAR LAMINECTOMY  03/2020   MOUTH SURGERY  12/07/2021   oral surgery   ODONTOID SCREW INSERTION N/A 01/27/2018   Procedure: ODONTOID SCREW INSERTION;  Surgeon: Tressie Stalker, MD;  Location:  [facility] OR;  Service: Neurosurgery;  Laterality: N/A;  ODONTOID SCREW INSERTION   TONSILLECTOMY     TONSILLECTOMY AND ADENOIDECTOMY     TUBAL LIGATION     Past Surgical History:  Procedure Laterality Date   ANTERIOR CERVICAL DECOMP/DISCECTOMY FUSION N/A 03/09/2020   Procedure: ANTERIOR CERVICAL DECOMPRESSION/DISCECTOMY FUSION, INTERBODY PROSTHESIS, PLATE SCREWS CERVICAL FIVE-SIX, CERVICAL SIX-SEVEN;  Surgeon: Tressie Stalker, MD;  Location: [facility] OR;  Service: Neurosurgery;  Laterality: N/A;  BACK SURGERY  2003   BALLOON DILATION N/A 11/12/2022   Procedure: BALLOON DILATION;  Surgeon: Lanelle Bal, DO;  [facility];  Service: Endoscopy;  Laterality: N/A;   BIOPSY  04/17/2016   Procedure: BIOPSY;  Surgeon: West Bali, MD;  [facility];  Service: Endoscopy;;  random colon bx's   BIOPSY  11/12/2022   Procedure: BIOPSY;  Surgeon: Lanelle Bal, DO;  [facility];  Service: Endoscopy;;   CERVICAL SPINE SURGERY  09/09/2017   CESAREAN SECTION  1974, 1978       COLONOSCOPY  06/19/2008   VOZ:[id number] internal hemorrhoids/mild sigmoin colon diverticulosis   COLONOSCOPY  2012   Dr. Jena Gauss: normal rectum, diverticula, lymphocytic colitis    COLONOSCOPY WITH PROPOFOL N/A 04/17/2016   Dr. Darrick Penna: Normal terminal ileum, internal/external hemorrhoids, random colon biopsies consistent with collagenous colitis   DILATION AND CURETTAGE OF UTERUS  1977   spontaneous abortion   ESOPHAGOGASTRODUODENOSCOPY  06/19/2008   [id number] gastritis   ESOPHAGOGASTRODUODENOSCOPY (EGD) WITH PROPOFOL N/A 11/12/2022   Procedure: ESOPHAGOGASTRODUODENOSCOPY (EGD) WITH PROPOFOL;  Surgeon: Lanelle Bal, DO;  [facility];  Service: Endoscopy;  Laterality: N/A;  8:30 am, asa 3   LUMBAR DISC ARTHROPLASTY  09/1996   LUMBAR FUSION  6/03, 11/08, 11/14   L4-5 fusion, L2-3 fusion, L1-2   LUMBAR FUSION  08/03/2020   L4-L5   LUMBAR LAMINECTOMY  03/2020   MOUTH SURGERY   12/07/2021   oral surgery   ODONTOID SCREW INSERTION N/A 01/27/2018   Procedure: ODONTOID SCREW INSERTION;  Surgeon: Tressie Stalker, MD;  Location: [facility] OR;  Service: Neurosurgery;  Laterality: N/A;  ODONTOID SCREW INSERTION   TONSILLECTOMY     TONSILLECTOMY AND ADENOIDECTOMY     TUBAL LIGATION     Past Medical History:  Diagnosis Date   Asthma    Albuterol in haler prn   Cataract    immature unsure which eye   Chronic back pain    COVID-19 October/November 2020   Degenerative disk disease    psoriatic   Diabetes mellitus without complication (HCC)    diet controlled   Esophageal motility disorder    Non-specific, see modified barium study/speech path, BP   Fibromyalgia    GERD (gastroesophageal reflux disease)    takes Protonix bid   Headache 2019   History of blood transfusion    post c-section   History of bronchitis    HTN (hypertension)    Hyperlipidemia    takes Pravastatin daily   Lymphocytic colitis 05/26/2010   Responded to Entocort x 3 MOS   Neuropathy    Nonallergic rhinitis    Osteoporosis    gets Boniva every 3 months   Peripheral edema    takes HCTZ daily   Pneumonia 02/2019   Psoriatic arthritis (HCC)    Dr. Anthonette Legato   Psoriatic arthritis [facility])    bilateral hands   Raynaud's disease    Seasonal allergies    Shingles 2020   SUI (stress urinary incontinence, female)    Urinary urgency    BP (!) 183/82   Pulse (!) 101   Ht 4\' 10"  (1.473 m)   Wt 122 lb (55.3 kg)   LMP 02/01/1999   SpO2 95%   BMI 25.50 kg/m   Opioid Risk Score:   Fall Risk Score:  `1  Depression screen [facility] 2/9     03/20/2023    9:50 AM 02/20/2023    1:05 PM 12/13/2022  10:04 AM 09/19/2022   10:21 AM 06/15/2022    9:42 AM 12/12/2020   10:27 AM 11/19/2017    3:26 PM  Depression screen PHQ 2/9  Decreased Interest 0 0 0 0 0 0 0  Down, Depressed, Hopeless 0 0 0 0 0 0 0  PHQ - 2 Score 0 0 0 0 0 0 0  Altered sleeping     0    Tired, decreased energy     2    Change in  appetite     0    Feeling bad or failure about yourself      0    Trouble concentrating     0    Moving slowly or fidgety/restless     1    Suicidal thoughts     0    PHQ-9 Score     3    Difficult doing work/chores     Very difficult       Review of Systems  Constitutional: Negative.   HENT: Negative.    Eyes: Negative.   Respiratory: Negative.    Gastrointestinal:  Positive for abdominal pain, diarrhea and vomiting.  Endocrine: Negative.   Genitourinary: Negative.   Musculoskeletal:  Positive for back pain and neck pain.       Thoracic pain and bilateral feet.  Had an injection for thoracic yesterday .  Skin: Negative.   Allergic/Immunologic: Negative.   Neurological: Negative.   Hematological: Negative.   Psychiatric/Behavioral: Negative.    All other systems reviewed and are negative.      Objective:   Physical Exam  Awake, alert, appropriate, sitting on table Hands shiny- no pores seen at all- might be why cannot get voltaren to work And has severe arthritic changes to  hands- forming claws.   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   L hip bursitis and also has SI joint pain more on L- as well as mid thoracic acute on chronic pain.   Lidocaine ointment- 5%- will try to see if that helps arthritic pain.    2.  Con't gabapentin  600 mg 3x/day- has refills for another 3 months.    3.  Refill Norco 7.5/325 mg 3x/day as needed # 90- increased pain meds from 5mg /325 mg since 1.5 tabs was making a difference.   4. Discussed pt's Nausea and vomiting that's now chronic- and work up.    5. Sounds like the thoracic steroid injection is  helping- so hopefully it will help for longer period of time.    6. F/U in 2 months with Riley Lam and f/u with me in 4 months- on chronic pain due to psoriatic arthritis.   7. Last checked UDS last appt- 12/19/22.  Per clinic policy.    I spent a total of  24    minutes on total care today- >50% coordination of care- due to d/w pt about increasing pain meds- about f/u with NP and d/w pt about chronic N/V as detailed above.

## 2023-03-20 NOTE — Patient Instructions (Signed)
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   L hip bursitis and also has SI joint pain more on L- as well as mid thoracic acute on chronic pain.   Lidocaine ointment- 5%- will try to see if that helps arthritic pain.    2.  Con't gabapentin  600 mg 3x/day- has refills for another 3 months.    3.  Refill Norco 7.5/325 mg 3x/day as needed # 90- increased pain meds from 5mg /325 mg since 1.5 tabs was making a difference.   4. Discussed pt's Nausea and vomiting that's now chronic- and work up.    5. Sounds like the thoracic steroid injection is helping- so hopefully it will help for longer period of time.    6. F/U in 2 months with Riley Lam and f/u with me in 4 months- on chronic pain due to psoriatic arthritis.

## 2023-03-22 ENCOUNTER — Encounter (HOSPITAL_COMMUNITY)
Admission: RE | Admit: 2023-03-22 | Discharge: 2023-03-22 | Disposition: A | Discharge status: Home / Self Care | Payer: PPO | Source: Ambulatory Visit | Attending: Internal Medicine | Admitting: Internal Medicine

## 2023-03-22 DIAGNOSIS — R1010 Upper abdominal pain, unspecified: Secondary | ICD-10-CM | POA: Insufficient documentation

## 2023-03-22 DIAGNOSIS — R112 Nausea with vomiting, unspecified: Secondary | ICD-10-CM | POA: Diagnosis not present

## 2023-03-22 MED ORDER — TECHNETIUM TC 99M MEBROFENIN IV KIT
5.2000 | PACK | Freq: Once | INTRAVENOUS | Status: AC | PRN
  Administered 2023-03-22: 5.2 via INTRAVENOUS

## 2023-03-22 MED ORDER — STERILE WATER FOR INJECTION IJ SOLN
INTRAMUSCULAR | Status: AC
Start: 2023-03-22 — End: ?
  Filled 2023-03-22: qty 10

## 2023-03-22 MED ORDER — SINCALIDE 5 MCG IJ SOLR
INTRAMUSCULAR | Status: AC
  Filled 2023-03-22: qty 5

## 2023-03-25 ENCOUNTER — Other Ambulatory Visit: Payer: Self-pay | Admitting: *Deleted

## 2023-03-25 ENCOUNTER — Telehealth: Payer: Self-pay | Admitting: *Deleted

## 2023-03-25 ENCOUNTER — Telehealth: Payer: Self-pay

## 2023-03-25 DIAGNOSIS — K828 Other specified diseases of gallbladder: Secondary | ICD-10-CM

## 2023-03-25 NOTE — Telephone Encounter (Signed)
PA for Lidocaine 5% Ointment sent 03/25/2023.

## 2023-03-25 NOTE — Telephone Encounter (Signed)
Okay to refer to Dr Lovell Sheehan thank you

## 2023-03-25 NOTE — Telephone Encounter (Signed)
Referral sent 

## 2023-03-25 NOTE — Telephone Encounter (Signed)
Pt says she seen her results of HIDA scan. She says it showed gallbladder hyperkinesia. She says she was told that she could be referred to general surgery for gallbladder removal without an office visit. She would like to be referred to Dr.Jenkins. Please advise. Thank you

## 2023-03-29 ENCOUNTER — Other Ambulatory Visit: Payer: Self-pay | Admitting: Rheumatology

## 2023-03-29 DIAGNOSIS — Z79899 Other long term (current) drug therapy: Secondary | ICD-10-CM

## 2023-03-29 DIAGNOSIS — Z131 Encounter for screening for diabetes mellitus: Secondary | ICD-10-CM

## 2023-03-29 DIAGNOSIS — L4050 Arthropathic psoriasis, unspecified: Secondary | ICD-10-CM

## 2023-03-29 DIAGNOSIS — Z8639 Personal history of other endocrine, nutritional and metabolic disease: Secondary | ICD-10-CM

## 2023-03-29 DIAGNOSIS — L409 Psoriasis, unspecified: Secondary | ICD-10-CM

## 2023-03-29 MED ORDER — TALTZ 80 MG/ML ~~LOC~~ SOAJ: 80.0000 mg | SUBCUTANEOUS | 0 refills | Status: DC

## 2023-03-29 NOTE — Telephone Encounter (Signed)
Received a fax from  [facility] regarding an approval for TALTZ patient assistance from 04/03/2023 to 04/01/2024. Approval letter sent to scan center.  Case # [id number] Phone: [phone] Fax: [phone]  Chesley Mires, PharmD, MPH, BCPS, CPP Clinical Pharmacist (Rheumatology and Pulmonology)

## 2023-03-29 NOTE — Telephone Encounter (Signed)
Received patient form for Taltz PAP renewal. Submitted Patient Assistance RENEWAL Application to [facility] for TALTZ along with provider portion, patient portion, PA, medication list, insurance card copy. Will update patient when we receive a response.  Phone: [phone] Fax: [phone]  Chesley Mires, PharmD, MPH, BCPS, CPP Clinical Pharmacist (Rheumatology and Pulmonology)

## 2023-03-29 NOTE — Telephone Encounter (Signed)
Patient contacted the office to request a medication refill.   1. Name of Medication: Taltz  2. How are you currently taking this medication (dosage and times per day)? 1 injection 28 day   3. What pharmacy would you like for that to be sent to? [facility]

## 2023-03-29 NOTE — Telephone Encounter (Signed)
Last Fill: 12/05/2022  Labs: 12/20/2022  White cell count is elevated most likely due to steroid use.  Sodium is low and stable.   TB Gold: 09/20/2022   Next Visit: 05/15/2023  Last Visit: 12/20/2022  [id number] arthritis   Current Dose per office note 12/20/2022: Taltz 80 mg sq injections q 4 weeks   Patient advised she is due to update her lab work. Patient plans to have her labs on Monday.   Okay to refill Taltz?

## 2023-04-01 ENCOUNTER — Other Ambulatory Visit: Payer: Self-pay | Admitting: *Deleted

## 2023-04-01 ENCOUNTER — Encounter: Payer: Self-pay | Admitting: Home Modifications

## 2023-04-01 ENCOUNTER — Other Ambulatory Visit: Payer: Self-pay | Admitting: Internal Medicine

## 2023-04-01 DIAGNOSIS — Z79899 Other long term (current) drug therapy: Secondary | ICD-10-CM | POA: Diagnosis not present

## 2023-04-01 DIAGNOSIS — Z8639 Personal history of other endocrine, nutritional and metabolic disease: Secondary | ICD-10-CM | POA: Diagnosis not present

## 2023-04-01 DIAGNOSIS — Z131 Encounter for screening for diabetes mellitus: Secondary | ICD-10-CM

## 2023-04-01 NOTE — Telephone Encounter (Signed)
Last Fill: 12/06/2022  Next Visit: 05/15/2023  Last Visit: 12/20/2022  Dx: not mentioned  Current Dose per office note on 12/20/2022: not mentioned  Okay to refill Topamax?

## 2023-04-02 LAB — COMPLETE METABOLIC PANEL WITH GFR
AG Ratio: 1.9 (calc) (ref 1.0–2.5)
ALT: 37 U/L — ABNORMAL HIGH (ref 6–29)
AST: 23 U/L (ref 10–35)
Albumin: 4.2 g/dL (ref 3.6–5.1)
Alkaline phosphatase (APISO): 56 U/L (ref 37–153)
BUN/Creatinine Ratio: 35 (calc) — ABNORMAL HIGH (ref 6–22)
BUN: 36 mg/dL — ABNORMAL HIGH (ref 7–25)
CO2: 21 mmol/L (ref 20–32)
Calcium: 9.3 mg/dL (ref 8.6–10.4)
Chloride: 105 mmol/L (ref 98–110)
Creat: 1.04 mg/dL — ABNORMAL HIGH (ref 0.60–1.00)
Globulin: 2.2 g/dL (ref 1.9–3.7)
Glucose, Bld: 150 mg/dL — ABNORMAL HIGH (ref 65–99)
Potassium: 4.3 mmol/L (ref 3.5–5.3)
Sodium: 134 mmol/L — ABNORMAL LOW (ref 135–146)
Total Bilirubin: 0.3 mg/dL (ref 0.2–1.2)
Total Protein: 6.4 g/dL (ref 6.1–8.1)
eGFR: 57 mL/min/{1.73_m2} — ABNORMAL LOW (ref >=60)

## 2023-04-02 LAB — CBC WITH DIFFERENTIAL/PLATELET
Absolute Lymphocytes: 1326 {cells}/uL (ref 850–3900)
Absolute Monocytes: 745 {cells}/uL (ref 200–950)
Basophils Absolute: 119 {cells}/uL (ref 0–200)
Basophils Relative: 0.8 %
Eosinophils Absolute: 685 {cells}/uL — ABNORMAL HIGH (ref 15–500)
Eosinophils Relative: 4.6 %
HCT: 35.8 % (ref 35.0–45.0)
Hemoglobin: 11.8 g/dL (ref 11.7–15.5)
MCH: 31.7 pg (ref 27.0–33.0)
MCHC: 33 g/dL (ref 32.0–36.0)
MCV: 96.2 fL (ref 80.0–100.0)
MPV: 11.2 fL (ref 7.5–12.5)
Monocytes Relative: 5 %
Neutro Abs: 12024 {cells}/uL — ABNORMAL HIGH (ref 1500–7800)
Neutrophils Relative %: 80.7 %
Platelets: 240 10*3/uL (ref 140–400)
RBC: 3.72 10*6/uL — ABNORMAL LOW (ref 3.80–5.10)
RDW: 12.2 % (ref 11.0–15.0)
Total Lymphocyte: 8.9 %
WBC: 14.9 10*3/uL — ABNORMAL HIGH (ref 3.8–10.8)

## 2023-04-02 LAB — HEMOGLOBIN A1C
Hgb A1c MFr Bld: 6.5 %{Hb} — ABNORMAL HIGH (ref <5.7)
Mean Plasma Glucose: 140 mg/dL
eAG (mmol/L): 7.7 mmol/L

## 2023-04-03 HISTORY — PX: CHOLECYSTECTOMY: SHX55

## 2023-04-08 ENCOUNTER — Other Ambulatory Visit: Payer: Self-pay | Admitting: Pharmacy Technician

## 2023-04-09 ENCOUNTER — Ambulatory Visit: Payer: PPO | Admitting: General Surgery

## 2023-04-09 ENCOUNTER — Encounter: Payer: Self-pay | Admitting: General Surgery

## 2023-04-09 VITALS — BP 121/71 | HR 73 | Temp 98.3°F | Resp 12 | Ht <= 58 in | Wt 118.0 lb

## 2023-04-09 DIAGNOSIS — K828 Other specified diseases of gallbladder: Secondary | ICD-10-CM

## 2023-04-09 NOTE — Progress Notes (Signed)
 Tonya Hall; [id number]; 08/17/1950   HPI Patient is a 73 year old white female who was referred to my care by Dr. Gaither Langton and Dr. Cindie of GI for evaluation treatment of biliary dyskinesia.  Patient has had many months of right upper quadrant abdominal pain, nausea, and bloating.  She denies any fever, chills, or jaundice.  GI has done extensive workup.  Ultrasound the gallbladder was negative for cholelithiasis.  HIDA scan revealed hyperkinesis of the gallbladder.  Patient states her symptoms are not resolving and are becoming more frequent. Past Medical History:  Diagnosis Date   Asthma    Albuterol  in haler prn   Cataract    immature unsure which eye   Chronic back pain    COVID-19 October/November 2020   Degenerative disk disease    psoriatic   Diabetes mellitus without complication (HCC)    diet controlled   Esophageal motility disorder    Non-specific, see modified barium study/speech path, BP   Fibromyalgia    GERD (gastroesophageal reflux disease)    takes Protonix  bid   Headache 2019   History of blood transfusion    post c-section   History of bronchitis    HTN (hypertension)    Hyperlipidemia    takes Pravastatin daily   Lymphocytic colitis 05/26/2010   Responded to Entocort x 3 MOS   Neuropathy    Nonallergic rhinitis    Osteoporosis    gets Boniva  every 3 months   Peripheral edema    takes HCTZ daily   Pneumonia 02/2019   Psoriatic arthritis (HCC)    Dr. Elin   Psoriatic arthritis [facility])    bilateral hands   Raynaud's disease    Seasonal allergies    Shingles 2020   SUI (stress urinary incontinence, female)    Urinary urgency     Past Surgical History:  Procedure Laterality Date   ANTERIOR CERVICAL DECOMP/DISCECTOMY FUSION N/A 03/09/2020   Procedure: ANTERIOR CERVICAL DECOMPRESSION/DISCECTOMY FUSION, INTERBODY PROSTHESIS, PLATE SCREWS CERVICAL FIVE-SIX, CERVICAL SIX-SEVEN;  Surgeon: Mavis Purchase, MD;  Location: [facility] OR;  Service:  Neurosurgery;  Laterality: N/A;   BACK SURGERY  2003   BALLOON DILATION N/A 11/12/2022   Procedure: BALLOON DILATION;  Surgeon: Cindie Carlin POUR, DO;  [facility];  Service: Endoscopy;  Laterality: N/A;   BIOPSY  04/17/2016   Procedure: BIOPSY;  Surgeon: Margo LITTIE Haddock, MD;  [facility];  Service: Endoscopy;;  random colon bx's   BIOPSY  11/12/2022   Procedure: BIOPSY;  Surgeon: Cindie Carlin POUR, DO;  [facility];  Service: Endoscopy;;   CERVICAL SPINE SURGERY  09/09/2017   CESAREAN SECTION  1974, 1978       COLONOSCOPY  06/19/2008   DOQ:dfjoo internal hemorrhoids/mild sigmoin colon diverticulosis   COLONOSCOPY  2012   Dr. Shaaron: normal rectum, diverticula, lymphocytic colitis    COLONOSCOPY WITH PROPOFOL  N/A 04/17/2016   Dr. Haddock: Normal terminal ileum, internal/external hemorrhoids, random colon biopsies consistent with collagenous colitis   DILATION AND CURETTAGE OF UTERUS  1977   spontaneous abortion   ESOPHAGOGASTRODUODENOSCOPY  06/19/2008   DOQ:fnizmjuz gastritis   ESOPHAGOGASTRODUODENOSCOPY (EGD) WITH PROPOFOL  N/A 11/12/2022   Procedure: ESOPHAGOGASTRODUODENOSCOPY (EGD) WITH PROPOFOL ;  Surgeon: Cindie Carlin POUR, DO;  [facility];  Service: Endoscopy;  Laterality: N/A;  8:30 am, asa 3   LUMBAR DISC ARTHROPLASTY  09/1996   LUMBAR FUSION  6/03, 11/08, 11/14   L4-5 fusion, L2-3 fusion, L1-2   LUMBAR FUSION  08/03/2020  L4-L5   LUMBAR LAMINECTOMY  03/2020   MOUTH SURGERY  12/07/2021   oral surgery   ODONTOID SCREW INSERTION N/A 01/27/2018   Procedure: ODONTOID SCREW INSERTION;  Surgeon: Mavis Purchase, MD;  Location: [facility] OR;  Service: Neurosurgery;  Laterality: N/A;  ODONTOID SCREW INSERTION   TONSILLECTOMY     TONSILLECTOMY AND ADENOIDECTOMY     TUBAL LIGATION      Family History  Problem Relation Age of Onset   Diabetes Mother    Pancreatitis Mother    Arthritis Mother        psoriatic    Fibromyalgia Mother     Rheumatic fever Father    Diabetes Other        grandparents   Skin cancer Other        grandfather   Hypertension Other        grandparent   Congestive Heart Failure Other        grandfather   Parkinson's disease Other        grandmother   Colon cancer Maternal Uncle    Fibromyalgia Sister    Rheum arthritis Sister    Diabetes Sister    Fibromyalgia Sister    Diabetes Sister    Rheum arthritis Sister    Hypertension Son    Colon polyps Neg Hx     Current Outpatient Medications on File Prior to Visit  Medication Sig Dispense Refill   albuterol  (PROVENTIL  HFA;VENTOLIN  HFA) 108 (90 BASE) MCG/ACT inhaler Inhale 2 puffs into the lungs every 6 (six) hours as needed for wheezing or shortness of breath.      Apremilast  (OTEZLA ) 30 MG TABS Take 1 tablet (30 mg total) by mouth 2 (two) times daily. 180 tablet 0   aspirin  81 MG tablet Take 81 mg by mouth daily.     Blood Glucose Monitoring Suppl (ONETOUCH VERIO FLEX SYSTEM) w/Device KIT      cetirizine (ZYRTEC) 10 MG tablet Take 10 mg by mouth daily as needed (allergies).     empagliflozin  (JARDIANCE ) 10 MG TABS tablet Take 10 mg by mouth daily.     fish oil-omega-3 fatty acids  1000 MG capsule Take 2 capsules (2 g total) by mouth daily. 2-4 GRAMS DAILY (Patient taking differently: Take 1 g by mouth 2 (two) times daily. 2-4 GRAMS DAILY)     gabapentin  (NEURONTIN ) 600 MG tablet Take 1 tablet (600 mg total) by mouth 3 (three) times daily. For nerve pain 90 tablet 5   Ginger , Zingiber officinalis, (GINGER  ROOT) 500 MG CAPS Take 500 mg by mouth daily.     Glucosamine Sulfate 1000 MG CAPS Take 2,000 mg by mouth daily.     HYDROcodone -acetaminophen  (NORCO) 7.5-325 MG tablet Take 1 tablet by mouth every 6 (six) hours as needed for moderate pain (pain score 4-6) or severe pain (pain score 7-10). For chronic pain 90 tablet 0   hydrocortisone  sodium succinate  (SOLU-CORTEF ) 100 MG SOLR injection Inject 100 mg into the muscle daily as needed (when unable  to keep down prednisone  tablet).      ixekizumab  (TALTZ ) 80 MG/ML pen Inject 1 mL (80 mg total) into the skin every 28 (twenty-eight) days. 1 mL 0   Lancets (ONETOUCH DELICA PLUS LANCET33G) MISC USE TO TEST 3 TIMESUDAILY.     leflunomide  (ARAVA ) 20 MG tablet TAKE 1 TABLET BY MOUTH ONCE DAILY. 90 tablet 0   lidocaine  (XYLOCAINE ) 5 % ointment Apply 1 Application topically as needed. For hand pain due to psoriatic arthritis- 50 g  5   losartan  (COZAAR ) 100 MG tablet Take 100 mg by mouth daily.     metoprolol  succinate (TOPROL -XL) 50 MG 24 hr tablet Take 1 tablet (50 mg total) by mouth daily. Take with or immediately following a meal.     Misc Natural Products (TART CHERRY ADVANCED PO) Take 1 tablet by mouth daily. daily     Multiple Vitamin (MULTIVITAMIN) tablet Take 1 tablet by mouth 3 (three) times a week.     nortriptyline  (PAMELOR ) 25 MG capsule TAKE (1) CAPSULE BY MOUTH AT BEDTIME. 30 capsule 0   ONETOUCH VERIO test strip      oxybutynin  (DITROPAN -XL) 5 MG 24 hr tablet Take 1 tablet (5 mg total) by mouth at bedtime. 90 tablet 3   pantoprazole  (PROTONIX ) 40 MG tablet TAKE (1) TABLET BY MOUTH TWICE DAILY. 180 tablet 3   Polyethyl Glycol-Propyl Glycol (SYSTANE OP) Place 1 drop into both eyes 4 (four) times daily as needed (dry eyes).     predniSONE  (DELTASONE ) 5 MG tablet TAKE 1 TABLET BY MOUTH DAILY WITH BREAKFAST 90 tablet 0   spironolactone  (ALDACTONE ) 25 MG tablet Take 25 mg by mouth daily.     topiramate  (TOPAMAX ) 200 MG tablet TAKE (1) TABLET BY MOUTH ONCE AT BEDTIME. 90 tablet 0   Turmeric 500 MG CAPS Take 500 mg by mouth every morning.     No current facility-administered medications on file prior to visit.    Allergies  Allergen Reactions   Adalimumab Rash and Other (See Comments)    FEVER, Fast pulse Other reaction(s): Unknown   Imuran [Azathioprine Sodium] Rash    Denies Airway involvement   Methotrexate Other (See Comments)    Liver Enzyme elevation - after 1 month of  use Other reaction(s): Unknown   Plaquenil [Hydroxychloroquine Sulfate] Rash    Denies airway involvement.    Sulfonamide Derivatives Rash    Occurred with Sulfasalazine. Rash only.    Azathioprine     Other reaction(s): Unknown   Ceftin [Cefuroxime Axetil] Nausea Only    Upset stomach   Cefuroxime     Other reaction(s): GI upset   Lisinopril     Other reaction(s): Unknown   Sulfamethoxazole-Trimethoprim     Other reaction(s): Unknown   Morphine Nausea And Vomiting    IV morphine caused N and V  After surgery.   Has tolerated Kadian in the past     Social History   Substance and Sexual Activity  Alcohol  Use No    Social History   Tobacco Use  Smoking Status Never   Passive exposure: Never  Smokeless Tobacco Never    Review of Systems  Constitutional:  Positive for weight loss.  HENT: Negative.    Eyes: Negative.   Cardiovascular: Negative.   Gastrointestinal:  Positive for abdominal pain, heartburn, nausea and vomiting.  Genitourinary: Negative.   Musculoskeletal:  Positive for back pain, joint pain and neck pain.  Skin: Negative.   Neurological:  Positive for sensory change.  Endo/Heme/Allergies: Negative.   Psychiatric/Behavioral: Negative.      Objective   Vitals:   04/09/23 1237  BP: 121/71  Pulse: 73  Resp: 12  Temp: 98.3 F (36.8 C)  SpO2: 96%    Physical Exam Vitals reviewed.  Constitutional:      Appearance: Normal appearance. She is normal weight. She is not ill-appearing.  HENT:     Head: Normocephalic and atraumatic.  Eyes:     General: No scleral icterus. Cardiovascular:     Rate  and Rhythm: Normal rate and regular rhythm.     Heart sounds: Normal heart sounds. No murmur heard.    No friction rub. No gallop.  Pulmonary:     Effort: Pulmonary effort is normal. No respiratory distress.     Breath sounds: Normal breath sounds. No stridor. No wheezing, rhonchi or rales.  Abdominal:     General: Bowel sounds are normal. There is no  distension.     Palpations: Abdomen is soft. There is no mass.     Tenderness: There is no abdominal tenderness. There is no guarding or rebound.     Hernia: No hernia is present.     Comments: Discomfort to deep palpation in the right upper quadrant.  Skin:    General: Skin is warm and dry.  Neurological:     Mental Status: She is alert and oriented to person, place, and time.    GI notes reviewed Ultrasound and HIDA scan reports reviewed Assessment  Biliary dyskinesia secondary to hyperkinesis.  No other etiology of the patient's symptoms has been found at the present time. Plan  Patient is scheduled for robotic assisted laparoscopic cholecystectomy on 04/15/2023.  The risks and benefits of the procedure including bleeding, infection, hepatobiliary injury, the possibility of an open procedure, and the possibility of recurrence of her symptoms were fully explained to the patient, who gave informed consent.

## 2023-04-09 NOTE — H&P (Signed)
 Tonya Hall; [id number]; [birth year]   HPI Patient is a 73 year old white female who was referred to my care by Dr. Gaither Langton and Dr. Cindie of GI for evaluation treatment of biliary dyskinesia.  Patient has had many months of right upper quadrant abdominal pain, nausea, and bloating.  She denies any fever, chills, or jaundice.  GI has done extensive workup.  Ultrasound the gallbladder was negative for cholelithiasis.  HIDA scan revealed hyperkinesis of the gallbladder.  Patient states her symptoms are not resolving and are becoming more frequent. Past Medical History:  Diagnosis Date   Asthma    Albuterol  in haler prn   Cataract    immature unsure which eye   Chronic back pain    COVID-19 October/November 2020   Degenerative disk disease    psoriatic   Diabetes mellitus without complication (HCC)    diet controlled   Esophageal motility disorder    Non-specific, see modified barium study/speech path, BP   Fibromyalgia    GERD (gastroesophageal reflux disease)    takes Protonix  bid   Headache 2019   History of blood transfusion    post c-section   History of bronchitis    HTN (hypertension)    Hyperlipidemia    takes Pravastatin daily   Lymphocytic colitis 05/26/2010   Responded to Entocort x 3 MOS   Neuropathy    Nonallergic rhinitis    Osteoporosis    gets Boniva  every 3 months   Peripheral edema    takes HCTZ daily   Pneumonia 02/2019   Psoriatic arthritis (HCC)    Dr. Elin   Psoriatic arthritis [facility])    bilateral hands   Raynaud's disease    Seasonal allergies    Shingles 2020   SUI (stress urinary incontinence, female)    Urinary urgency     Past Surgical History:  Procedure Laterality Date   ANTERIOR CERVICAL DECOMP/DISCECTOMY FUSION N/A 03/09/2020   Procedure: ANTERIOR CERVICAL DECOMPRESSION/DISCECTOMY FUSION, INTERBODY PROSTHESIS, PLATE SCREWS CERVICAL FIVE-SIX, CERVICAL SIX-SEVEN;  Surgeon: Mavis Purchase, MD;  Location: [facility] OR;  Service:  Neurosurgery;  Laterality: N/A;   BACK SURGERY  2003   BALLOON DILATION N/A 11/12/2022   Procedure: BALLOON DILATION;  Surgeon: Cindie Carlin POUR, DO;  [facility];  Service: Endoscopy;  Laterality: N/A;   BIOPSY  04/17/2016   Procedure: BIOPSY;  Surgeon: Margo LITTIE Haddock, MD;  [facility];  Service: Endoscopy;;  random colon bx's   BIOPSY  11/12/2022   Procedure: BIOPSY;  Surgeon: Cindie Carlin POUR, DO;  [facility];  Service: Endoscopy;;   CERVICAL SPINE SURGERY  09/09/2017   CESAREAN SECTION  1974, 1978       COLONOSCOPY  06/19/2008   DOQ:dfjoo internal hemorrhoids/mild sigmoin colon diverticulosis   COLONOSCOPY  2012   Dr. Shaaron: normal rectum, diverticula, lymphocytic colitis    COLONOSCOPY WITH PROPOFOL  N/A 04/17/2016   Dr. Haddock: Normal terminal ileum, internal/external hemorrhoids, random colon biopsies consistent with collagenous colitis   DILATION AND CURETTAGE OF UTERUS  1977   spontaneous abortion   ESOPHAGOGASTRODUODENOSCOPY  06/19/2008   DOQ:fnizmjuz gastritis   ESOPHAGOGASTRODUODENOSCOPY (EGD) WITH PROPOFOL  N/A 11/12/2022   Procedure: ESOPHAGOGASTRODUODENOSCOPY (EGD) WITH PROPOFOL ;  Surgeon: Cindie Carlin POUR, DO;  [facility];  Service: Endoscopy;  Laterality: N/A;  8:30 am, asa 3   LUMBAR DISC ARTHROPLASTY  09/1996   LUMBAR FUSION  6/03, 11/08, 11/14   L4-5 fusion, L2-3 fusion, L1-2   LUMBAR FUSION  08/03/2020  L4-L5   LUMBAR LAMINECTOMY  03/2020   MOUTH SURGERY  12/07/2021   oral surgery   ODONTOID SCREW INSERTION N/A 01/27/2018   Procedure: ODONTOID SCREW INSERTION;  Surgeon: Mavis Purchase, MD;  Location: [facility] OR;  Service: Neurosurgery;  Laterality: N/A;  ODONTOID SCREW INSERTION   TONSILLECTOMY     TONSILLECTOMY AND ADENOIDECTOMY     TUBAL LIGATION      Family History  Problem Relation Age of Onset   Diabetes Mother    Pancreatitis Mother    Arthritis Mother        psoriatic    Fibromyalgia Mother     Rheumatic fever Father    Diabetes Other        grandparents   Skin cancer Other        grandfather   Hypertension Other        grandparent   Congestive Heart Failure Other        grandfather   Parkinson's disease Other        grandmother   Colon cancer Maternal Uncle    Fibromyalgia Sister    Rheum arthritis Sister    Diabetes Sister    Fibromyalgia Sister    Diabetes Sister    Rheum arthritis Sister    Hypertension Son    Colon polyps Neg Hx     Current Outpatient Medications on File Prior to Visit  Medication Sig Dispense Refill   albuterol  (PROVENTIL  HFA;VENTOLIN  HFA) 108 (90 BASE) MCG/ACT inhaler Inhale 2 puffs into the lungs every 6 (six) hours as needed for wheezing or shortness of breath.      Apremilast  (OTEZLA ) 30 MG TABS Take 1 tablet (30 mg total) by mouth 2 (two) times daily. 180 tablet 0   aspirin  81 MG tablet Take 81 mg by mouth daily.     Blood Glucose Monitoring Suppl (ONETOUCH VERIO FLEX SYSTEM) w/Device KIT      cetirizine (ZYRTEC) 10 MG tablet Take 10 mg by mouth daily as needed (allergies).     empagliflozin  (JARDIANCE ) 10 MG TABS tablet Take 10 mg by mouth daily.     fish oil-omega-3 fatty acids  1000 MG capsule Take 2 capsules (2 g total) by mouth daily. 2-4 GRAMS DAILY (Patient taking differently: Take 1 g by mouth 2 (two) times daily. 2-4 GRAMS DAILY)     gabapentin  (NEURONTIN ) 600 MG tablet Take 1 tablet (600 mg total) by mouth 3 (three) times daily. For nerve pain 90 tablet 5   Ginger , Zingiber officinalis, (GINGER  ROOT) 500 MG CAPS Take 500 mg by mouth daily.     Glucosamine Sulfate 1000 MG CAPS Take 2,000 mg by mouth daily.     HYDROcodone -acetaminophen  (NORCO) 7.5-325 MG tablet Take 1 tablet by mouth every 6 (six) hours as needed for moderate pain (pain score 4-6) or severe pain (pain score 7-10). For chronic pain 90 tablet 0   hydrocortisone  sodium succinate  (SOLU-CORTEF ) 100 MG SOLR injection Inject 100 mg into the muscle daily as needed (when unable  to keep down prednisone  tablet).      ixekizumab  (TALTZ ) 80 MG/ML pen Inject 1 mL (80 mg total) into the skin every 28 (twenty-eight) days. 1 mL 0   Lancets (ONETOUCH DELICA PLUS LANCET33G) MISC USE TO TEST 3 TIMESUDAILY.     leflunomide  (ARAVA ) 20 MG tablet TAKE 1 TABLET BY MOUTH ONCE DAILY. 90 tablet 0   lidocaine  (XYLOCAINE ) 5 % ointment Apply 1 Application topically as needed. For hand pain due to psoriatic arthritis- 50 g  5   losartan  (COZAAR ) 100 MG tablet Take 100 mg by mouth daily.     metoprolol  succinate (TOPROL -XL) 50 MG 24 hr tablet Take 1 tablet (50 mg total) by mouth daily. Take with or immediately following a meal.     Misc Natural Products (TART CHERRY ADVANCED PO) Take 1 tablet by mouth daily. daily     Multiple Vitamin (MULTIVITAMIN) tablet Take 1 tablet by mouth 3 (three) times a week.     nortriptyline  (PAMELOR ) 25 MG capsule TAKE (1) CAPSULE BY MOUTH AT BEDTIME. 30 capsule 0   ONETOUCH VERIO test strip      oxybutynin  (DITROPAN -XL) 5 MG 24 hr tablet Take 1 tablet (5 mg total) by mouth at bedtime. 90 tablet 3   pantoprazole  (PROTONIX ) 40 MG tablet TAKE (1) TABLET BY MOUTH TWICE DAILY. 180 tablet 3   Polyethyl Glycol-Propyl Glycol (SYSTANE OP) Place 1 drop into both eyes 4 (four) times daily as needed (dry eyes).     predniSONE  (DELTASONE ) 5 MG tablet TAKE 1 TABLET BY MOUTH DAILY WITH BREAKFAST 90 tablet 0   spironolactone  (ALDACTONE ) 25 MG tablet Take 25 mg by mouth daily.     topiramate  (TOPAMAX ) 200 MG tablet TAKE (1) TABLET BY MOUTH ONCE AT BEDTIME. 90 tablet 0   Turmeric 500 MG CAPS Take 500 mg by mouth every morning.     No current facility-administered medications on file prior to visit.    Allergies  Allergen Reactions   Adalimumab Rash and Other (See Comments)    FEVER, Fast pulse Other reaction(s): Unknown   Imuran [Azathioprine Sodium] Rash    Denies Airway involvement   Methotrexate Other (See Comments)    Liver Enzyme elevation - after 1 month of  use Other reaction(s): Unknown   Plaquenil [Hydroxychloroquine Sulfate] Rash    Denies airway involvement.    Sulfonamide Derivatives Rash    Occurred with Sulfasalazine. Rash only.    Azathioprine     Other reaction(s): Unknown   Ceftin [Cefuroxime Axetil] Nausea Only    Upset stomach   Cefuroxime     Other reaction(s): GI upset   Lisinopril     Other reaction(s): Unknown   Sulfamethoxazole-Trimethoprim     Other reaction(s): Unknown   Morphine Nausea And Vomiting    IV morphine caused N and V  After surgery.   Has tolerated Kadian in the past     Social History   Substance and Sexual Activity  Alcohol  Use No    Social History   Tobacco Use  Smoking Status Never   Passive exposure: Never  Smokeless Tobacco Never    Review of Systems  Constitutional:  Positive for weight loss.  HENT: Negative.    Eyes: Negative.   Cardiovascular: Negative.   Gastrointestinal:  Positive for abdominal pain, heartburn, nausea and vomiting.  Genitourinary: Negative.   Musculoskeletal:  Positive for back pain, joint pain and neck pain.  Skin: Negative.   Neurological:  Positive for sensory change.  Endo/Heme/Allergies: Negative.   Psychiatric/Behavioral: Negative.      Objective   Vitals:   04/09/23 1237  BP: 121/71  Pulse: 73  Resp: 12  Temp: 98.3 F (36.8 C)  SpO2: 96%    Physical Exam Vitals reviewed.  Constitutional:      Appearance: Normal appearance. She is normal weight. She is not ill-appearing.  HENT:     Head: Normocephalic and atraumatic.  Eyes:     General: No scleral icterus. Cardiovascular:     Rate  and Rhythm: Normal rate and regular rhythm.     Heart sounds: Normal heart sounds. No murmur heard.    No friction rub. No gallop.  Pulmonary:     Effort: Pulmonary effort is normal. No respiratory distress.     Breath sounds: Normal breath sounds. No stridor. No wheezing, rhonchi or rales.  Abdominal:     General: Bowel sounds are normal. There is no  distension.     Palpations: Abdomen is soft. There is no mass.     Tenderness: There is no abdominal tenderness. There is no guarding or rebound.     Hernia: No hernia is present.     Comments: Discomfort to deep palpation in the right upper quadrant.  Skin:    General: Skin is warm and dry.  Neurological:     Mental Status: She is alert and oriented to person, place, and time.    GI notes reviewed Ultrasound and HIDA scan reports reviewed Assessment  Biliary dyskinesia secondary to hyperkinesis.  No other etiology of the patient's symptoms has been found at the present time. Plan  Patient is scheduled for robotic assisted laparoscopic cholecystectomy on 04/15/2023.  The risks and benefits of the procedure including bleeding, infection, hepatobiliary injury, the possibility of an open procedure, and the possibility of recurrence of her symptoms were fully explained to the patient, who gave informed consent.  Will stop Jardiance  prior to procedure.

## 2023-04-09 NOTE — Addendum Note (Signed)
 Addended by: Phillips Odor on: 04/09/2023 02:07 PM   Modules accepted: Orders

## 2023-04-10 ENCOUNTER — Ambulatory Visit: Payer: PPO | Admitting: Internal Medicine

## 2023-04-10 ENCOUNTER — Other Ambulatory Visit: Payer: Self-pay | Admitting: *Deleted

## 2023-04-10 ENCOUNTER — Other Ambulatory Visit: Payer: Self-pay | Admitting: Physician Assistant

## 2023-04-10 DIAGNOSIS — L409 Psoriasis, unspecified: Secondary | ICD-10-CM

## 2023-04-10 MED ORDER — OTEZLA 30 MG PO TABS: 30.0000 mg | ORAL_TABLET | Freq: Two times a day (BID) | ORAL | 0 refills | Status: DC

## 2023-04-10 NOTE — Telephone Encounter (Signed)
 Last Fill: 12/06/2022  Labs: 04/01/2023 CBC stable. Creatinine is elevated-1.04 and GFR is low-57. Please clarify if she has been taking any NSAIDs? Glucose is 150.  ALT is borderline elevated-37.  Avoid the use of tylenol  and alcohol .  Next Visit: 05/15/2023  Last Visit: 12/20/2022  DX:  Psoriatic arthritis    Current Dose per office note 12/20/2022: Otezla  30 mg 1 tablet by mouth twice daily,   Okay to refill Otezla ?

## 2023-04-10 NOTE — Telephone Encounter (Signed)
 Last Fill: 01/04/2023  Next Visit: 05/15/2023  Last Visit: 12/20/2022  Dx: Psoriatic arthritis   Current Dose per office note on 12/20/2022: prednisone 5 mg 1 tablet daily   Okay to refill Prednisone?

## 2023-04-11 ENCOUNTER — Telehealth: Payer: Self-pay | Admitting: Pharmacist

## 2023-04-11 NOTE — Telephone Encounter (Signed)
 Submitted a Prior Authorization RENEWAL request to [facility] [employer] for OTEZLA via CoverMyMeds. Will update once we receive a response.  Key: Tonya Hall

## 2023-04-12 ENCOUNTER — Other Ambulatory Visit: Payer: Self-pay

## 2023-04-12 ENCOUNTER — Encounter (HOSPITAL_COMMUNITY): Payer: Self-pay

## 2023-04-12 ENCOUNTER — Other Ambulatory Visit (HOSPITAL_COMMUNITY): Payer: PPO

## 2023-04-12 ENCOUNTER — Encounter (HOSPITAL_COMMUNITY)
Admission: RE | Admit: 2023-04-12 | Discharge: 2023-04-12 | Disposition: A | Discharge status: Home / Self Care | Payer: PPO | Source: Ambulatory Visit | Attending: General Surgery

## 2023-04-12 DIAGNOSIS — Z79899 Other long term (current) drug therapy: Secondary | ICD-10-CM

## 2023-04-12 DIAGNOSIS — L4050 Arthropathic psoriasis, unspecified: Secondary | ICD-10-CM

## 2023-04-12 DIAGNOSIS — L409 Psoriasis, unspecified: Secondary | ICD-10-CM

## 2023-04-12 MED ORDER — TALTZ 80 MG/ML ~~LOC~~ SOAJ: 80.0000 mg | SUBCUTANEOUS | 0 refills | Status: DC

## 2023-04-12 NOTE — Telephone Encounter (Signed)
 Patient contacted the office and requests a refill of Taltz  be sent to the [facility].   Last Fill: 03/29/2023  Labs: 04/01/2023 CBC stable. Creatinine is elevated-1.04 and GFR is low-57. Please clarify if she has been taking any NSAIDs? Glucose is 150.  ALT is borderline elevated-37.  Avoid the use of tylenol  and alcohol .  TB Gold: 09/20/2022   Next Visit: 05/14/2022  Last Visit: 12/20/2022  IK:Ednmpjupr arthritis   Current Dose per office note 12/20/2022: Taltz  80 mg sq injections q 4 weeks   Okay to refill Taltz ?

## 2023-04-12 NOTE — Telephone Encounter (Signed)
 Received notification from [employer] regarding a prior authorization for OTEZLA . Authorization has been APPROVED from 04/11/2023 to 04/10/2024. Approval letter sent to scan center.  Authorization # [id number]  Sherry Pennant, PharmD, MPH, BCPS, CPP Clinical Pharmacist (Rheumatology and Pulmonology)

## 2023-04-15 ENCOUNTER — Ambulatory Visit (HOSPITAL_COMMUNITY)
Admission: RE | Admit: 2023-04-15 | Discharge: 2023-04-15 | Disposition: A | Discharge status: Home / Self Care | Payer: PPO | Attending: General Surgery | Admitting: General Surgery

## 2023-04-15 ENCOUNTER — Other Ambulatory Visit: Payer: Self-pay | Admitting: *Deleted

## 2023-04-15 ENCOUNTER — Encounter (HOSPITAL_COMMUNITY)
Admission: RE | Disposition: A | Discharge status: Home / Self Care | Payer: Self-pay | Source: Home / Self Care | Attending: General Surgery

## 2023-04-15 ENCOUNTER — Ambulatory Visit (HOSPITAL_COMMUNITY): Payer: Self-pay | Admitting: Anesthesiology

## 2023-04-15 ENCOUNTER — Other Ambulatory Visit: Payer: Self-pay

## 2023-04-15 ENCOUNTER — Ambulatory Visit (HOSPITAL_BASED_OUTPATIENT_CLINIC_OR_DEPARTMENT_OTHER): Payer: Self-pay | Admitting: Anesthesiology

## 2023-04-15 DIAGNOSIS — M549 Dorsalgia, unspecified: Secondary | ICD-10-CM | POA: Diagnosis not present

## 2023-04-15 DIAGNOSIS — L409 Psoriasis, unspecified: Secondary | ICD-10-CM

## 2023-04-15 DIAGNOSIS — I10 Essential (primary) hypertension: Secondary | ICD-10-CM

## 2023-04-15 DIAGNOSIS — J45909 Unspecified asthma, uncomplicated: Secondary | ICD-10-CM | POA: Insufficient documentation

## 2023-04-15 DIAGNOSIS — M797 Fibromyalgia: Secondary | ICD-10-CM | POA: Insufficient documentation

## 2023-04-15 DIAGNOSIS — E119 Type 2 diabetes mellitus without complications: Secondary | ICD-10-CM | POA: Insufficient documentation

## 2023-04-15 DIAGNOSIS — G8929 Other chronic pain: Secondary | ICD-10-CM | POA: Diagnosis not present

## 2023-04-15 DIAGNOSIS — Z7984 Long term (current) use of oral hypoglycemic drugs: Secondary | ICD-10-CM | POA: Diagnosis not present

## 2023-04-15 DIAGNOSIS — K219 Gastro-esophageal reflux disease without esophagitis: Secondary | ICD-10-CM | POA: Insufficient documentation

## 2023-04-15 DIAGNOSIS — Z7952 Long term (current) use of systemic steroids: Secondary | ICD-10-CM | POA: Diagnosis not present

## 2023-04-15 DIAGNOSIS — K8010 Calculus of gallbladder with chronic cholecystitis without obstruction: Secondary | ICD-10-CM | POA: Diagnosis not present

## 2023-04-15 DIAGNOSIS — K828 Other specified diseases of gallbladder: Secondary | ICD-10-CM

## 2023-04-15 DIAGNOSIS — I7300 Raynaud's syndrome without gangrene: Secondary | ICD-10-CM | POA: Diagnosis not present

## 2023-04-15 LAB — GLUCOSE, CAPILLARY
Glucose-Capillary: 114 mg/dL — ABNORMAL HIGH (ref 70–99)
Glucose-Capillary: 178 mg/dL — ABNORMAL HIGH (ref 70–99)

## 2023-04-15 SURGERY — CHOLECYSTECTOMY, ROBOT-ASSISTED, LAPAROSCOPIC
Anesthesia: General | Site: Abdomen

## 2023-04-15 MED ORDER — CHLORHEXIDINE GLUCONATE CLOTH 2 % EX PADS: 6.0000 | MEDICATED_PAD | Freq: Once | CUTANEOUS | Status: DC

## 2023-04-15 MED ORDER — FENTANYL CITRATE (PF) 100 MCG/2ML IJ SOLN
INTRAMUSCULAR | Status: AC
  Filled 2023-04-15: qty 2

## 2023-04-15 MED ORDER — DEXAMETHASONE SODIUM PHOSPHATE 10 MG/ML IJ SOLN
INTRAMUSCULAR | Status: DC | PRN
  Administered 2023-04-15: 6 mg via INTRAVENOUS

## 2023-04-15 MED ORDER — HYDROMORPHONE HCL 1 MG/ML IJ SOLN
INTRAMUSCULAR | Status: DC | PRN
  Administered 2023-04-15 (×2): .5 mg via INTRAVENOUS

## 2023-04-15 MED ORDER — INDOCYANINE GREEN 25 MG IV SOLR
2.5000 mg | Freq: Once | INTRAVENOUS | Status: AC
Start: 2023-04-15 — End: 2023-04-15

## 2023-04-15 MED ORDER — ONDANSETRON HCL 4 MG/2ML IJ SOLN
INTRAMUSCULAR | Status: AC
  Filled 2023-04-15: qty 2

## 2023-04-15 MED ORDER — LACTATED RINGERS IV SOLN: INTRAVENOUS | Status: DC

## 2023-04-15 MED ORDER — LIDOCAINE HCL (PF) 2 % IJ SOLN
INTRAMUSCULAR | Status: AC
  Filled 2023-04-15: qty 5

## 2023-04-15 MED ORDER — FENTANYL CITRATE (PF) 100 MCG/2ML IJ SOLN
INTRAMUSCULAR | Status: DC | PRN
  Administered 2023-04-15 (×3): 50 ug via INTRAVENOUS

## 2023-04-15 MED ORDER — LIDOCAINE HCL (CARDIAC) PF 100 MG/5ML IV SOSY
PREFILLED_SYRINGE | INTRAVENOUS | Status: DC | PRN
  Administered 2023-04-15: 60 mg via INTRAVENOUS

## 2023-04-15 MED ORDER — ONDANSETRON HCL 4 MG/2ML IJ SOLN: 4.0000 mg | Freq: Once | INTRAMUSCULAR | Status: DC | PRN

## 2023-04-15 MED ORDER — ROCURONIUM BROMIDE 10 MG/ML (PF) SYRINGE
PREFILLED_SYRINGE | INTRAVENOUS | Status: AC
  Filled 2023-04-15: qty 10

## 2023-04-15 MED ORDER — BUPIVACAINE HCL (PF) 0.5 % IJ SOLN
INTRAMUSCULAR | Status: DC | PRN
  Administered 2023-04-15: 30 mL

## 2023-04-15 MED ORDER — ORAL CARE MOUTH RINSE: 15.0000 mL | Freq: Once | OROMUCOSAL | Status: AC

## 2023-04-15 MED ORDER — CHLORHEXIDINE GLUCONATE 0.12 % MT SOLN
15.0000 mL | Freq: Once | OROMUCOSAL | Status: AC
  Administered 2023-04-15: 15 mL via OROMUCOSAL

## 2023-04-15 MED ORDER — PHENYLEPHRINE 80 MCG/ML (10ML) SYRINGE FOR IV PUSH (FOR BLOOD PRESSURE SUPPORT)
PREFILLED_SYRINGE | INTRAVENOUS | Status: DC | PRN
  Administered 2023-04-15 (×2): 80 ug via INTRAVENOUS

## 2023-04-15 MED ORDER — PROPOFOL 10 MG/ML IV BOLUS
INTRAVENOUS | Status: AC
Start: 2023-04-15 — End: ?
  Filled 2023-04-15: qty 20

## 2023-04-15 MED ORDER — STERILE WATER FOR IRRIGATION IR SOLN
Status: DC | PRN
  Administered 2023-04-15: 500 mL

## 2023-04-15 MED ORDER — LIDOCAINE HCL (PF) 1 % IJ SOLN
INTRAMUSCULAR | Status: AC
  Filled 2023-04-15: qty 30

## 2023-04-15 MED ORDER — HYDROMORPHONE HCL 1 MG/ML IJ SOLN
INTRAMUSCULAR | Status: AC
  Filled 2023-04-15: qty 1

## 2023-04-15 MED ORDER — SUGAMMADEX SODIUM 200 MG/2ML IV SOLN
INTRAVENOUS | Status: DC | PRN
  Administered 2023-04-15: 200 mg via INTRAVENOUS

## 2023-04-15 MED ORDER — HYDROMORPHONE HCL 1 MG/ML IJ SOLN
0.5000 mg | INTRAMUSCULAR | Status: DC | PRN
Start: 2023-04-15 — End: 2023-04-15
  Administered 2023-04-15: 0.5 mg via INTRAVENOUS
  Filled 2023-04-15: qty 0.5

## 2023-04-15 MED ORDER — ROCURONIUM BROMIDE 10 MG/ML (PF) SYRINGE
PREFILLED_SYRINGE | INTRAVENOUS | Status: DC | PRN
  Administered 2023-04-15: 50 mg via INTRAVENOUS

## 2023-04-15 MED ORDER — ONDANSETRON HCL 4 MG/2ML IJ SOLN
INTRAMUSCULAR | Status: DC | PRN
  Administered 2023-04-15: 4 mg via INTRAVENOUS

## 2023-04-15 MED ORDER — DEXAMETHASONE SODIUM PHOSPHATE 10 MG/ML IJ SOLN
INTRAMUSCULAR | Status: AC
  Filled 2023-04-15: qty 1

## 2023-04-15 MED ORDER — CIPROFLOXACIN IN D5W 400 MG/200ML IV SOLN
400.0000 mg | INTRAVENOUS | Status: AC
  Administered 2023-04-15: 400 mg via INTRAVENOUS
  Filled 2023-04-15: qty 200

## 2023-04-15 MED ORDER — PROPOFOL 10 MG/ML IV BOLUS
INTRAVENOUS | Status: DC | PRN
  Administered 2023-04-15: 130 mg via INTRAVENOUS

## 2023-04-15 MED ORDER — INDOCYANINE GREEN 25 MG IV SOLR
INTRAVENOUS | Status: AC
  Administered 2023-04-15: 2.5 mg via INTRAVENOUS
  Filled 2023-04-15: qty 10

## 2023-04-15 MED ORDER — BUPIVACAINE HCL (PF) 0.5 % IJ SOLN
INTRAMUSCULAR | Status: AC
  Filled 2023-04-15: qty 30

## 2023-04-15 SURGICAL SUPPLY — 38 items
CAUTERY HOOK MNPLR 1.6 DVNC XI (INSTRUMENTS) ×1 IMPLANT
CHLORAPREP W/TINT 26 (MISCELLANEOUS) ×1 IMPLANT
CLIP LIGATING HEM O LOK PURPLE (MISCELLANEOUS) ×1 IMPLANT
DERMABOND ADVANCED .7 DNX12 (GAUZE/BANDAGES/DRESSINGS) ×1 IMPLANT
DRAPE ARM DVNC X/XI (DISPOSABLE) ×4 IMPLANT
DRAPE COLUMN DVNC XI (DISPOSABLE) ×1 IMPLANT
ELECT REM PT RETURN 9FT ADLT (ELECTROSURGICAL) ×1
ELECTRODE REM PT RTRN 9FT ADLT (ELECTROSURGICAL) ×1 IMPLANT
FORCEPS BPLR R/ABLATION 8 DVNC (INSTRUMENTS) ×1 IMPLANT
FORCEPS PROGRASP DVNC XI (FORCEP) ×1 IMPLANT
GLOVE BIO SURGEON STRL SZ7 (GLOVE) IMPLANT
GLOVE BIOGEL PI IND STRL 7.0 (GLOVE) ×2 IMPLANT
GLOVE BIOGEL PI IND STRL 7.5 (GLOVE) IMPLANT
GLOVE SURG SS PI 7.5 STRL IVOR (GLOVE) ×2 IMPLANT
GOWN STRL REUS W/TWL LRG LVL3 (GOWN DISPOSABLE) ×3 IMPLANT
IRRIGATOR SUCT 8 DISP DVNC XI (IRRIGATION / IRRIGATOR) IMPLANT
KIT TURNOVER KIT A (KITS) ×1 IMPLANT
MANIFOLD NEPTUNE II (INSTRUMENTS) ×1 IMPLANT
NDL HYPO 21X1.5 SAFETY (NEEDLE) ×1 IMPLANT
NDL INSUFFLATION 14GA 120MM (NEEDLE) ×1 IMPLANT
NEEDLE HYPO 21X1.5 SAFETY (NEEDLE) ×1
NEEDLE INSUFFLATION 14GA 120MM (NEEDLE) ×1
OBTURATOR OPTICAL STND 8 DVNC (TROCAR) ×1
OBTURATOR OPTICALSTD 8 DVNC (TROCAR) ×1 IMPLANT
PACK LAP CHOLE LZT030E (CUSTOM PROCEDURE TRAY) ×1 IMPLANT
PAD ARMBOARD 7.5X6 YLW CONV (MISCELLANEOUS) ×1 IMPLANT
PENCIL HANDSWITCHING (ELECTRODE) ×1 IMPLANT
POSITIONER HEAD 8X9X4 ADT (SOFTGOODS) ×1 IMPLANT
SEAL UNIV 5-12 XI (MISCELLANEOUS) ×4 IMPLANT
SET BASIN LINEN APH (SET/KITS/TRAYS/PACK) ×1 IMPLANT
SET TUBE SMOKE EVAC HIGH FLOW (TUBING) ×1 IMPLANT
SPIKE FLUID TRANSFER (MISCELLANEOUS) ×1 IMPLANT
SUT MNCRL AB 4-0 PS2 18 (SUTURE) ×2 IMPLANT
SUT VICRYL 0 AB UR-6 (SUTURE) ×1 IMPLANT
SYR 30ML LL (SYRINGE) ×1 IMPLANT
SYS RETRIEVAL 5MM INZII UNIV (BASKET) ×1
SYSTEM RETRIEVL 5MM INZII UNIV (BASKET) ×1 IMPLANT
WATER STERILE IRR 500ML POUR (IV SOLUTION) ×1 IMPLANT

## 2023-04-15 NOTE — Interval H&P Note (Signed)
 History and Physical Interval Note:  04/15/2023 7:12 AM  Tonya Hall Tonya Hall  has presented today for surgery, with the diagnosis of BILIARY DYSKINESIA.  The various methods of treatment have been discussed with the patient and family. After consideration of risks, benefits and other options for treatment, the patient has consented to  Procedure(s): XI ROBOTIC ASSISTED LAPAROSCOPIC CHOLECYSTECTOMY (N/A) as a surgical intervention.  The patient's history has been reviewed, patient examined, no change in status, stable for surgery.  I have reviewed the patient's chart and labs.  Questions were answered to the patient's satisfaction.     Tonya Hall

## 2023-04-15 NOTE — Anesthesia Procedure Notes (Signed)
 Procedure Name: Intubation Date/Time: 04/15/2023 7:37 AM  Performed by: Para Jerelene CROME, CRNAPre-anesthesia Checklist: Patient identified, Emergency Drugs available, Suction available and Patient being monitored Patient Re-evaluated:Patient Re-evaluated prior to induction Oxygen Delivery Method: Circle system utilized Preoxygenation: Pre-oxygenation with 100% oxygen Induction Type: IV induction Ventilation: Mask ventilation without difficulty Laryngoscope Size: Mac and 4 Grade View: Grade I Tube type: Oral Tube size: 7.0 mm Number of attempts: 1 Airway Equipment and Method: Stylet Placement Confirmation: ETT inserted through vocal cords under direct vision, positive ETCO2, CO2 detector and breath sounds checked- equal and bilateral Secured at: 21 cm Tube secured with: Tape Dental Injury: Teeth and Oropharynx as per pre-operative assessment  Comments: Atraumatic intubation. Lips and teeth remain in preoperative condition.

## 2023-04-15 NOTE — Anesthesia Postprocedure Evaluation (Signed)
 Anesthesia Post Note  Patient: Tonya Hall Blood  Procedure(s) Performed: XI ROBOTIC ASSISTED LAPAROSCOPIC CHOLECYSTECTOMY (Abdomen)  Patient location during evaluation: PACU Anesthesia Type: General Level of consciousness: awake and alert Pain management: pain level controlled Vital Signs Assessment: post-procedure vital signs reviewed and stable Respiratory status: spontaneous breathing, nonlabored ventilation, respiratory function stable and patient connected to nasal cannula oxygen Cardiovascular status: blood pressure returned to baseline and stable Postop Assessment: no apparent nausea or vomiting Anesthetic complications: no   There were no known notable events for this encounter.   Last Vitals:  Vitals:   04/15/23 0930 04/15/23 0944  BP: (!) 121/58 (!) 130/54  Pulse: 87 82  Resp: 12 16  Temp: 36.5 C 36.6 C  SpO2: 93% 93%    Last Pain:  Vitals:   04/15/23 0944  TempSrc: Axillary  PainSc: 2                  [name]

## 2023-04-15 NOTE — Op Note (Signed)
 Patient:  Tonya Hall  DOB:  Sep 02, 1950  MRN:  [id number]   Preop Diagnosis: Biliary dyskinesia, hyperkinesis  Postop Diagnosis: Same  Procedure: Robotic assisted laparoscopic cholecystectomy  Surgeon: Oneil Budge, MD  Anes: General Endotracheal  Indications: Patient is a 73 year old white female who presents with biliary dyskinesia secondary to hyperkinesis.  The risks and benefits of the procedure including bleeding, infection, hepatobiliary injury, and the possibility of an open procedure were fully explained to the patient, who gave informed consent.  Procedure note: The patient was placed in supine position.  After induction of general endotracheal anesthesia, the abdomen was prepped and draped using the usual sterile technique with ChloraPrep.  Surgical site confirmation was performed.  An infraumbilical incision was made down to the fascia.  A Veress needle was introduced into the abdominal cavity and confirmation of placement was done using the saline drop test.  The abdomen was then insufflated to 15 mmHg pressure.  An 8 mm trocar was introduced into the abdominal cavity under direct visualization without difficulty.  The patient was placed in reverse Trendelenburg position and additional 8 mm trocars were placed in the left upper quadrant, right lower quadrant, and right flank regions.  The robot was then docked and targeted.  The liver was inspected and noted to be within normal limits for the patient's age.  The gallbladder was retracted in a dynamic fashion in order to provide a critical view of the triangle of Calot.  The cystic duct was first identified.  Its junction to the infundibulum was fully identified.  This was confirmed by [facility].  Hem-o-lok clips were placed proximally and distally on the cystic duct and the cystic duct was divided.  This was likewise done to the cystic artery.  The gallbladder was freed away from the gallbladder fossa using Bovie electrocautery.   The gallbladder was delivered through the left upper quadrant trocar site using an Endo Catch bag without difficulty.  The gallbladder fossa was inspected and no abnormal bleeding or bile leakage was noted.  The robot was undocked and all fluid and air were then evacuated from the abdominal cavity prior to removal of the trocars.  All wounds were irrigated with normal saline.  All wounds were injected with 0.5% Sensorcaine .  The infraumbilical fascia was reapproximated using an 0 Vicryl interrupted suture.  All skin incisions were closed using a 4-0 Monocryl subcuticular suture.  Dermabond was applied.  All tape and needle counts were correct at the end of the procedure.  The patient was extubated in the operating room and transferred to PACU in stable condition.  Complications: None  EBL: Minimal  Specimen: Gallbladder

## 2023-04-15 NOTE — Transfer of Care (Signed)
 Immediate Anesthesia Transfer of Care Note  Patient: Tonya Hall Blood  Procedure(s) Performed: XI ROBOTIC ASSISTED LAPAROSCOPIC CHOLECYSTECTOMY (Abdomen)  Patient Location: PACU  Anesthesia Type:General  Level of Consciousness: drowsy and patient cooperative  Airway & Oxygen Therapy: Patient Spontanous Breathing and Patient connected to face mask oxygen  Post-op Assessment: Report given to RN and Post -op Vital signs reviewed and stable  Post vital signs: Reviewed and stable  Last Vitals:  Vitals Value Taken Time  BP 148/59 04/15/23 0836  Temp 36.9 C 04/15/23 0836  Pulse 82 04/15/23 0836  Resp 17 04/15/23 0836  SpO2 96 % 04/15/23 0836    Last Pain:  Vitals:   04/15/23 0638  TempSrc: Oral  PainSc: 2       Patients Stated Pain Goal: 5 (04/15/23 9361)  Complications: No notable events documented.

## 2023-04-15 NOTE — Anesthesia Preprocedure Evaluation (Signed)
 Anesthesia Evaluation    Airway Mallampati: II  TM Distance: >3 FB Neck ROM: Full    Dental no notable dental hx. (+) Dental Advisory Given, Teeth Intact   Pulmonary asthma    Pulmonary exam normal breath sounds clear to auscultation       Cardiovascular hypertension,  Rhythm:Regular Rate:Normal     Neuro/Psych Raynaud's disease.  Neuromuscular disease    GI/Hepatic ,GERD  ,,Esophageal motility disorder.  Lymphocytic colitis   Endo/Other  diabetes, Well Controlled, Type 2    Renal/GU Renal disease     Musculoskeletal  (+) Arthritis ,    Abdominal Normal abdominal exam  (+)   Peds  Hematology   Anesthesia Other Findings Psoriatic arthritis  Reproductive/Obstetrics                             Anesthesia Physical Anesthesia Plan  ASA: 3  Anesthesia Plan: General   Post-op Pain Management: Dilaudid  IV   Induction: Intravenous  PONV Risk Score and Plan: Ondansetron , Dexamethasone  and Midazolam   Airway Management Planned: Oral ETT  Additional Equipment: None  Intra-op Plan:   Post-operative Plan: Extubation in OR  Informed Consent: I have reviewed the patients History and Physical, chart, labs and discussed the procedure including the risks, benefits and alternatives for the proposed anesthesia with the patient or authorized representative who has indicated his/her understanding and acceptance.     Dental advisory given  Plan Discussed with: CRNA  Anesthesia Plan Comments:        Anesthesia Quick Evaluation

## 2023-04-16 ENCOUNTER — Encounter (HOSPITAL_COMMUNITY): Payer: Self-pay | Admitting: General Surgery

## 2023-04-16 ENCOUNTER — Other Ambulatory Visit (HOSPITAL_COMMUNITY): Payer: Self-pay | Admitting: Neurosurgery

## 2023-04-16 DIAGNOSIS — M5414 Radiculopathy, thoracic region: Secondary | ICD-10-CM | POA: Diagnosis not present

## 2023-04-16 DIAGNOSIS — M546 Pain in thoracic spine: Secondary | ICD-10-CM | POA: Diagnosis not present

## 2023-04-16 LAB — SURGICAL PATHOLOGY

## 2023-04-25 ENCOUNTER — Telehealth (INDEPENDENT_AMBULATORY_CARE_PROVIDER_SITE_OTHER): Payer: PPO | Admitting: General Surgery

## 2023-04-25 ENCOUNTER — Telehealth: Payer: Self-pay | Admitting: *Deleted

## 2023-04-25 ENCOUNTER — Telehealth: Payer: Self-pay

## 2023-04-25 DIAGNOSIS — Z09 Encounter for follow-up examination after completed treatment for conditions other than malignant neoplasm: Secondary | ICD-10-CM

## 2023-04-25 NOTE — Telephone Encounter (Signed)
Auth Submission: NO AUTH NEEDED Site of care: Site of care: [facility] Payer: [employer] Medication & CPT/J Code(s) submitted: prolia [id number] Route of submission (phone, fax, portal): phone Phone # Fax # Auth type: Buy/Bill PB Units/visits requested: 60mg ,q64months Reference number: [id number] Approval from: 04/25/23 to 04/01/24

## 2023-04-25 NOTE — Telephone Encounter (Signed)
Postoperative telephone call performed with patient.  I was in the hospital and the patient was at home.  She had called the office saying that there were some lumps around her incisions.  There is bruising present at the umbilicus and the left upper quadrant incision.  She is otherwise doing well but just a little sore.  No nausea or vomiting have been noted.  No fevers have been noted. I told the patient that this was probably rectus muscle irritation with some bruising from her incisions.  I told her this should resolve with time.  She states he is already feeling better since she had called the office earlier in the day.  Should she have any further issues, I told her to return to my office for in person evaluation.  She understands and agrees.  As this was a part of the total global surgical fee, this was not a billable visit.  Total telephone time was 3 minutes.

## 2023-04-25 NOTE — Telephone Encounter (Signed)
Surgical Date: 04/15/2023 Procedure: XI ROBOTIC ASSISTED LAPAROSCOPIC CHOLECYSTECTOMY   Received call from patient [phone]~ telephone  Patient reports x3 days of epigastric pain and pain to the upper left and right side of her abdomen. States that she is having normal BM's and passing gas appropriately. States that she has been using a heating pad, but this provides no relief.   Also reports left side upper abdominal incision is hard and the tissue under the incision feels "knotty". Reports that "knotty" areas are tender. States that incision is healing well with no obvious S/Sx of infection. Denies fever/ chills, N/V.   Please advise.

## 2023-05-01 ENCOUNTER — Other Ambulatory Visit: Payer: Self-pay

## 2023-05-01 DIAGNOSIS — R112 Nausea with vomiting, unspecified: Secondary | ICD-10-CM

## 2023-05-01 DIAGNOSIS — R1010 Upper abdominal pain, unspecified: Secondary | ICD-10-CM

## 2023-05-01 NOTE — Telephone Encounter (Signed)
Received letter from [employer] that patient's request for [facility] tier exception request is denied. Tonya Hall is on Tier 5 (specialty medication tier), and these tiered medications are not eligible for tier exception  Chesley Mires, PharmD, MPH, BCPS, CPP Clinical Pharmacist (Rheumatology and Pulmonology)

## 2023-05-01 NOTE — Progress Notes (Unsigned)
Office Visit Note  Patient: Tonya Hall             Date of Birth: 09-03-1950           MRN: [id number]             PCP: Carylon Perches, MD Referring: Carylon Perches, MD Visit Date: 05/15/2023 Occupation: @GUAROCC @  Subjective:  Pain in multiple joints   History of Present Illness: Tonya Hall is a 73 y.o. female with history of psoriatic arthritis and osteoarthritis.  Patient remains on  Taltz 80 mg sq injections q 4 weeks, Arava 20mg  1 tablet daily, Otezla 30 mg 1 tablet by mouth twice daily, and prednisone 5 mg 1 tablet daily.  She is tolerating combination therapy without any side effects.  Patient continues to have significant pain in the thoracic spine.  She had an updated MRI of the thoracic spine on 05/02/2023 and will be following up with Dr. Lovell Sheehan to discuss results and the next  steps in the treatment plan.  She continues to have chronic pain and stiffness in both hands particularly the right thumb.  She is considering following back up with the hand center to discuss her options since she has had limited ROM and decreased grip strength in the right thumb.   She denies any Achilles tendinitis or plantar fasciitis.  She denies any increased SI joint pain at this time.  She denies any recent or recurrent infections.  Patient underwent a cholecystectomy performed by Dr. Lovell Sheehan on 04/15/2023.  She is no longer having any nausea or vomiting.    Activities of Daily Living:  Patient reports morning stiffness for all day. Patient Denies nocturnal pain.  Difficulty dressing/grooming: Reports Difficulty climbing stairs: Reports Difficulty getting out of chair: Reports Difficulty using hands for taps, buttons, cutlery, and/or writing: Reports  Review of Systems  Constitutional:  Positive for fatigue.  HENT:  Positive for mouth dryness and nose dryness. Negative for mouth sores.   Eyes:  Positive for pain and dryness.  Respiratory:  Negative for shortness of breath and difficulty  breathing.   Cardiovascular:  Negative for chest pain and palpitations.  Gastrointestinal:  Negative for blood in stool, constipation and diarrhea.  Endocrine: Negative for increased urination.  Genitourinary:  Negative for involuntary urination.  Musculoskeletal:  Positive for joint pain, gait problem, joint pain, joint swelling, myalgias, muscle weakness, morning stiffness, muscle tenderness and myalgias.  Skin:  Positive for color change and rash. Negative for hair loss and sensitivity to sunlight.  Allergic/Immunologic: Negative for susceptible to infections.  Neurological:  Negative for dizziness and headaches.  Hematological:  Positive for swollen glands.  Psychiatric/Behavioral:  Negative for depressed mood and sleep disturbance. The patient is not nervous/anxious.     PMFS History:  Patient Active Problem List   Diagnosis Date Noted   Biliary dyskinesia 04/15/2023   Contracture of joint of hand 03/20/2023   Discogenic thoracic pain 03/20/2023   Encounter for diabetic foot exam (HCC) 01/28/2023   Diabetic peripheral neuropathy associated with type 2 diabetes mellitus (HCC) 01/28/2023   Acquired hammertoes of both feet 01/28/2023   Hallux valgus, acquired, bilateral 01/28/2023   Corns and callosities 01/28/2023   Onychomycosis 01/28/2023   N&V (nausea and vomiting) 01/28/2023   History of lumbar fusion 01/23/2023   Sciatica 01/23/2023   Statin not tolerated 10/23/2021   Chronic allergic conjunctivitis 07/29/2021   Vasomotor rhinitis 07/29/2021   Benign neoplasm of skin of lower limb 05/17/2021   History  of neoplasm 05/17/2021   Melanocytic nevi of left upper limb, including shoulder 05/17/2021   Melanocytic nevi of trunk 05/17/2021   Other seborrheic keratosis 05/17/2021   Diarrhea 03/10/2021   Fracture of pelvis (HCC) 01/27/2021   Pain of left hip joint 01/06/2021   Foraminal stenosis of lumbar region 08/03/2020   Cervical spondylosis with myelopathy and radiculopathy  03/09/2020   AKI (acute kidney injury) (HCC) 02/07/2019   Acute respiratory failure with hypoxia (HCC) 02/06/2019   Acute respiratory disease due to COVID-19 virus 02/06/2019   Community acquired pneumonia 02/06/2019   Hyponatremia 02/06/2019   Asthma    Diabetes mellitus without complication (HCC)    Closed nondisplaced odontoid fracture with type II morphology (HCC) 01/27/2018   Odontoid fracture (HCC) 01/24/2018   Chest pain 11/14/2017   Transaminitis 06/22/2016   Collagenous colitis 05/23/2016   Psoriasis 05/21/2016   High risk medication use 02/15/2016   Age-related osteoporosis without current pathological fracture 02/15/2016   Trochanteric bursitis, left hip 02/15/2016   Sacroiliitis, not elsewhere classified (HCC) 02/15/2016   Chronic pain syndrome 02/15/2016   Abnormal weight gain 06/25/2012   Hypertension 07/03/2011   Hypercholesteremia 07/03/2011   Psoriatic arthritis (HCC) 07/03/2011   Osteoarthritis of multiple joints 07/03/2011   Esophageal motility disorder 02/14/2011   Cough 02/14/2011   GERD 05/05/2010    Past Medical History:  Diagnosis Date   Asthma    Albuterol in haler prn   Cataract    immature unsure which eye   Chronic back pain    COVID-19 October/November 2020   Degenerative disk disease    psoriatic   Diabetes mellitus without complication (HCC)    diet controlled   Esophageal motility disorder    Non-specific, see modified barium study/speech path, BP   GERD (gastroesophageal reflux disease)    takes Protonix bid   History of blood transfusion    post c-section   History of bronchitis    HTN (hypertension)    Hyperlipidemia    takes Pravastatin daily   Lymphocytic colitis 05/26/2010   Responded to Entocort x 3 MOS   Neuropathy    Nonallergic rhinitis    Osteoporosis    gets Boniva every 3 months   Peripheral edema    takes HCTZ daily   Pneumonia 02/2019   Psoriatic arthritis (HCC)    Dr. Anthonette Legato   Psoriatic arthritis (HCC)     bilateral hands   Raynaud's disease    Seasonal allergies    Shingles 2020   SUI (stress urinary incontinence, female)    Urinary urgency     Family History  Problem Relation Age of Onset   Diabetes Mother    Pancreatitis Mother    Arthritis Mother        psoriatic    Fibromyalgia Mother    Rheumatic fever Father    Diabetes Other        grandparents   Skin cancer Other        grandfather   Hypertension Other        grandparent   Congestive Heart Failure Other        grandfather   Parkinson's disease Other        grandmother   Colon cancer Maternal Uncle    Fibromyalgia Sister    Rheum arthritis Sister    Diabetes Sister    Fibromyalgia Sister    Diabetes Sister    Rheum arthritis Sister    Hypertension Son    Colon polyps Neg Hx  Past Surgical History:  Procedure Laterality Date   ANTERIOR CERVICAL DECOMP/DISCECTOMY FUSION N/A 03/09/2020   Procedure: ANTERIOR CERVICAL DECOMPRESSION/DISCECTOMY FUSION, INTERBODY PROSTHESIS, PLATE SCREWS CERVICAL FIVE-SIX, CERVICAL SIX-SEVEN;  Surgeon: Tressie Stalker, MD;  Location: [facility] OR;  Service: Neurosurgery;  Laterality: N/A;   BACK SURGERY  2003   BALLOON DILATION N/A 11/12/2022   Procedure: BALLOON DILATION;  Surgeon: Lanelle Bal, DO;  Location: [facility];  Service: Endoscopy;  Laterality: N/A;   BIOPSY  04/17/2016   Procedure: BIOPSY;  Surgeon: West Bali, MD;  Location: [facility];  Service: Endoscopy;;  random colon bx's   BIOPSY  11/12/2022   Procedure: BIOPSY;  Surgeon: Lanelle Bal, DO;  Location: [facility];  Service: Endoscopy;;   CERVICAL SPINE SURGERY  09/09/2017   CESAREAN SECTION  1974, 1978       CHOLECYSTECTOMY  04/2023   COLONOSCOPY  06/19/2008   ZOX:WRUEA internal hemorrhoids/mild sigmoin colon diverticulosis   COLONOSCOPY  2012   Dr. Jena Gauss: normal rectum, diverticula, lymphocytic colitis    COLONOSCOPY WITH PROPOFOL N/A 04/17/2016   Dr. Darrick Penna: Normal terminal ileum,  internal/external hemorrhoids, random colon biopsies consistent with collagenous colitis   DILATION AND CURETTAGE OF UTERUS  1977   spontaneous abortion   ESOPHAGOGASTRODUODENOSCOPY  06/19/2008   VWU:[id number] gastritis   ESOPHAGOGASTRODUODENOSCOPY (EGD) WITH PROPOFOL N/A 11/12/2022   Procedure: ESOPHAGOGASTRODUODENOSCOPY (EGD) WITH PROPOFOL;  Surgeon: Lanelle Bal, DO;  Location: [facility];  Service: Endoscopy;  Laterality: N/A;  8:30 am, asa 3   LUMBAR DISC ARTHROPLASTY  09/1996   LUMBAR FUSION  6/03, 11/08, 11/14   L4-5 fusion, L2-3 fusion, L1-2   LUMBAR FUSION  08/03/2020   L4-L5   LUMBAR LAMINECTOMY  03/2020   MOUTH SURGERY  12/07/2021   oral surgery   ODONTOID SCREW INSERTION N/A 01/27/2018   Procedure: ODONTOID SCREW INSERTION;  Surgeon: Tressie Stalker, MD;  Location: [facility] OR;  Service: Neurosurgery;  Laterality: N/A;  ODONTOID SCREW INSERTION   TONSILLECTOMY     TONSILLECTOMY AND ADENOIDECTOMY     TUBAL LIGATION     Social History   Social History Narrative   ATTENDS [employer]. 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   Immunization History  Administered Date(s) Administered   Influenza,inj,Quad PF,6+ Mos 01/21/2017   Influenza-Unspecified 01/30/2017   Moderna Sars-Covid-2 Vaccination 06/01/2019, 07/02/2019   Pneumococcal Polysaccharide-23 02/19/2013   Tdap 09/10/2016     Objective: Vital Signs: BP (!) 162/76 (BP Location: Left Arm, Patient Position: Sitting, Cuff Size: Normal)   Pulse 71   Resp 13   Ht 4\' 10"  (1.473 m)   Wt 119 lb (54 kg)   LMP 02/01/1999   BMI 24.87 kg/m    Physical Exam Vitals and nursing note reviewed.  Constitutional:      Appearance: She is well-developed.  HENT:     Head: Normocephalic and atraumatic.  Eyes:     Conjunctiva/sclera: Conjunctivae normal.  Cardiovascular:     Rate and Rhythm: Normal rate and regular rhythm.     Heart sounds: Normal heart sounds.  Pulmonary:     Effort: Pulmonary effort is normal.      Breath sounds: Normal breath sounds.  Abdominal:     General: Bowel sounds are normal.     Palpations: Abdomen is soft.  Musculoskeletal:     Cervical back: Normal range of motion.  Lymphadenopathy:     Cervical: No cervical adenopathy.  Skin:    General: Skin is warm and dry.  Capillary Refill: Capillary refill takes less than 2 seconds.  Neurological:     Mental Status: She is alert and oriented to person, place, and time.  Psychiatric:        Behavior: Behavior normal.      Musculoskeletal Exam: C-spine has limited range of motion.  Severe thoracic kyphosis.  No SI joint tenderness upon palpation today.  Shoulder joints have good range of motion.  Elbow joints have good range of motion-no tenderness along the joint line.  Synovial thickening of bilateral second and third MCP joints.  Severe PIP and DIP thickening.  Subluxation of PIP and DIP joints.  Prominence and tenderness over the right CMC.  Thickening of the right first MCP joint.  Knee joints have good range of motion no warmth or effusion.  Ankle joints have good range of motion no tenderness or joint swelling.  No evidence of Achilles tendinitis or plantar fasciitis.  CDAI Exam: CDAI Score: -- Patient Global: --; Provider Global: -- Swollen: --; Tender: -- Joint Exam 05/15/2023   No joint exam has been documented for this visit   There is currently no information documented on the homunculus. Go to the Rheumatology activity and complete the homunculus joint exam.  Investigation: No additional findings.  Imaging: MR THORACIC SPINE WO CONTRAST Result Date: 05/13/2023 CLINICAL DATA:  Radiculopathy EXAM: MRI THORACIC SPINE WITHOUT CONTRAST TECHNIQUE: Multiplanar, multisequence MR imaging of the thoracic spine was performed. No intravenous contrast was administered. COMPARISON:  MR T SPine 09/19/21 FINDINGS: Alignment: Rightward curvature of the thoracic spine centered at the T9 level. There is right lateral listhesis of  T10 on T11 Vertebrae: No fracture, evidence of discitis, or bone lesion. Partially imaged postsurgical changes from cervical spinal fusion and lumbar spinal fusion/decompression Cord:  Normal signal and morphology. Paraspinal and other soft tissues: Large hiatal hernia with a patulous esophagus Disc levels: Lower lumbar spine predominant degenerative disc disease, most notably at T8-T9 where there is a left paracentral disc extrusion, T9-T10 where there is a right paracentral disc extrusion, T10-T11 there is a circumferential disc bulge with a right paracentral disc protrusion T11-T12 where there is a circumferential disc bulge. There is moderate spinal canal stenosis at T10-T11 and mild spinal canal narrowing at T8-T9 and T9-T10. There is likely moderate to severe bilateral neural foraminal stenosis at T10-T11 and moderate left-sided narrowing at T8-T9 and T9-T10. The left paracentral disc extrusion at T8-T9 and the right paracentral disc extrusion at T9-T10 are new from 09/19/21 IMPRESSION: 1. Lower lumbar spine predominant degenerative disc disease with moderate spinal canal stenosis at T10-T11 and mild spinal canal narrowing at T8-T9 and T9-T10. The left paracentral disc extrusion at T8-T9 and the right paracentral disc extrusion at T9-T10 are new from 09/19/21 2. Moderate to severe bilateral neural foraminal stenosis at T10-T11 and moderate left-sided narrowing at T8-T9 and T9-T10. Findings are unchanged from 09/19/21. 3. Large hiatal hernia with a patulous esophagus. Electronically Signed   By: Lorenza Cambridge M.D.   On: 05/13/2023 09:33    Recent Labs: Lab Results  Component Value Date   WBC 14.9 (H) 04/01/2023   HGB 11.8 04/01/2023   PLT 240 04/01/2023   NA 134 (L) 04/01/2023   K 4.3 04/01/2023   CL 105 04/01/2023   CO2 21 04/01/2023   GLUCOSE 150 (H) 04/01/2023   BUN 36 (H) 04/01/2023   CREATININE 1.04 (H) 04/01/2023   BILITOT 0.3 04/01/2023   ALKPHOS 70 12/21/2020   AST 23 04/01/2023   ALT 37  (  H) 04/01/2023   PROT 6.4 04/01/2023   ALBUMIN 4.4 12/21/2020   CALCIUM 9.3 04/01/2023   GFRAA 65 06/08/2020   QFTBGOLD Negative 05/05/2015   QFTBGOLDPLUS NEGATIVE 09/20/2022    Speciality Comments: MTX- high LFTs, PLQ,SSZ,Imuran, Humira- allergy, Enbrel , Remicade - inadequate response  Procedures:  No procedures performed Allergies: Adalimumab, Imuran [azathioprine sodium], Methotrexate, Plaquenil [hydroxychloroquine sulfate], Sulfonamide derivatives, Azathioprine, Ceftin [cefuroxime axetil], Lisinopril, Sulfamethoxazole-trimethoprim, and Morphine     Assessment / Plan:     Visit Diagnoses: Psoriatic arthritis (HCC): She has no synovitis or dactylitis on examination today.  Overall her symptoms have been stable on combination therapy: Taltz 80 mg sq injections every 4 weeks, Arava 20 mg 1 tablet daily, Otezla 30 mg 1 tablet twice daily, and prednisone 5 mg daily.  She continues to tolerate combination therapy without any side effects or recurrent infections.  Her SI joint pain has been manageable.  No evidence of Achilles tendinitis or plantar fasciitis.  Most of her discomfort has been in the thoracic spine as well as in both hands.  Patient plans on reestablishing care with the hand center for further evaluation of the right thumb pain and limitation of motion.  Patient is considering proceeding with surgical intervention if needed for the thoracic spine as well as for her right thumb in the future.  She is aware that she will need to hold both Taltz and Reyvow prior to surgical intervention--she will notify us if surgery is scheduled.  She plans on remaining under Dr. Dahlia Client care for pain management. No medication changes will be made at this time.  She was advised to notify us if she develops signs or symptoms of a flare.  She will follow-up in the office in 5 months or sooner if needed.  Psoriasis: 2 small patches of psoriasis noted on her scalp today.  Patient plans on trying a topical  steroid cream and will notify us if her symptoms persist or worsen.  High risk medication use - Taltz 80 mg sq injections q 4 weeks, Arava 20mg  1 tablet daily, Otezla 30 mg 1 tablet by mouth twice daily, and prednisone 5 mg 1 tablet daily. CBC and CMP updated on 04/01/23.  Her next lab work will be due in March and every 3 months.  TB gold negative on 09/20/22.  No recent or recurrent infections.  Discussed the importance of holding taltz and arava if she develops signs or symptoms of an infection and to resume once the infection has completely cleared.   Chronic left SI joint pain - Last SI joint cortisone injection was performed on 05/29/2022.  Her SI joint pain has been manageable recently.  Primary osteoarthritis of both knees: Viscosupplementation performed for both knees in October 2023.  No warmth or effusion noted.  DDD (degenerative disc disease), cervical: C-spine has limited range of motion.  DDD (degenerative disc disease), thoracic: Chronic pain.  Patient only gets relief when she is sitting with her heating pad.  Her activity level has been very limited due to the severity of pain.  MRI of the thoracic spine was updated on 05/02/2023: Lower lumbar spine predominant degenerative disc disease with moderate spinal canal stenosis at T10-T11 and mild spinal canal narrowing at T8-T9 and T9-T10.  The left paracentral disc extrusion at T8-T9 in the right paracentral disc extrusion at T9-T10 are new from 09/19/2021.  Moderate to severe bilateral neural foraminal stenosis at T10-T11 and moderate left-sided narrowing at T8-T9 and T9-T10.  Findings are unchanged since  09/19/2021. Patient will be following up with Dr. Lovell Sheehan to discuss results and the next steps in treatment.  Degeneration of intervertebral disc of lumbar region without discogenic back pain or lower extremity pain - Followed by Dr. Lovell Sheehan and Dr. Berline Chough.  Trochanteric bursitis of both hips - Right-s/p cortisone injection on  11/22/2022.  Raynaud's syndrome without gangrene: Not currently symptomatic.  No digital ulcers noted.   Age-related osteoporosis without current pathological fracture - Followed by Dr. Talmage Nap.  She remains on Prolia 60 mg sq injections every 6 months.    Chronic pain syndrome: Under care of Dr. Berline Chough. She is taking Norco as needed for pain relief.  Other medical conditions are listed as follows:   History of cholecystectomy: Performed by Dr. Lovell Sheehan on 04/15/23.    Essential hypertension: Blood pressure was 162/76 today in the office.  Patient was advised to monitor blood pressure closely and reach to PCP if remains elevated.  Esophageal motility disorder  Adrenal insufficiency (HCC)  History of gastroesophageal reflux (GERD)  Senile purpura (HCC)  Collagenous colitis  Orders: No orders of the defined types were placed in this encounter.  No orders of the defined types were placed in this encounter.   Follow-Up Instructions: Return in about 5 months (around 10/12/2023) for Psoriatic arthritis.   Gearldine Bienenstock, PA-C  Note - This record has been created using Dragon software.  Chart creation errors have been sought, but may not always  have been located. Such creation errors do not reflect on  the standard of medical care.

## 2023-05-02 ENCOUNTER — Ambulatory Visit (HOSPITAL_COMMUNITY)
Admission: RE | Admit: 2023-05-02 | Discharge: 2023-05-02 | Disposition: A | Discharge status: Home / Self Care | Payer: PPO | Source: Ambulatory Visit | Attending: Neurosurgery | Admitting: Neurosurgery

## 2023-05-02 DIAGNOSIS — M438X4 Other specified deforming dorsopathies, thoracic region: Secondary | ICD-10-CM | POA: Diagnosis not present

## 2023-05-02 DIAGNOSIS — K449 Diaphragmatic hernia without obstruction or gangrene: Secondary | ICD-10-CM | POA: Diagnosis not present

## 2023-05-02 DIAGNOSIS — M5114 Intervertebral disc disorders with radiculopathy, thoracic region: Secondary | ICD-10-CM | POA: Diagnosis not present

## 2023-05-02 DIAGNOSIS — M5414 Radiculopathy, thoracic region: Secondary | ICD-10-CM | POA: Insufficient documentation

## 2023-05-02 DIAGNOSIS — M4804 Spinal stenosis, thoracic region: Secondary | ICD-10-CM | POA: Diagnosis not present

## 2023-05-03 ENCOUNTER — Other Ambulatory Visit: Payer: Self-pay | Admitting: Physician Assistant

## 2023-05-03 DIAGNOSIS — L409 Psoriasis, unspecified: Secondary | ICD-10-CM

## 2023-05-03 DIAGNOSIS — Z79899 Other long term (current) drug therapy: Secondary | ICD-10-CM

## 2023-05-03 DIAGNOSIS — L4050 Arthropathic psoriasis, unspecified: Secondary | ICD-10-CM

## 2023-05-07 ENCOUNTER — Ambulatory Visit: Payer: PPO | Admitting: Podiatry

## 2023-05-07 ENCOUNTER — Encounter: Payer: Self-pay | Admitting: Podiatry

## 2023-05-07 VITALS — Ht <= 58 in | Wt 118.0 lb

## 2023-05-07 DIAGNOSIS — L84 Corns and callosities: Secondary | ICD-10-CM | POA: Diagnosis not present

## 2023-05-07 DIAGNOSIS — E1142 Type 2 diabetes mellitus with diabetic polyneuropathy: Secondary | ICD-10-CM

## 2023-05-13 ENCOUNTER — Encounter: Payer: Self-pay | Admitting: Podiatry

## 2023-05-13 NOTE — Progress Notes (Signed)
 Subjective:  Patient ID: Tonya Hall Blood, female    DOB: 08-Jun-1950,  MRN: [id number]  73 y.o. female presents to clinic with  at risk foot care with history of diabetic neuropathy and painful porokeratotic lesions b/l feet. Pain prevent(s) comfortable ambulation. Aggravating factor is weightbearing with and without shoegear.  Chief Complaint  Patient presents with   RFC    She is here for a callus trim, and right foot is worse, PCP is Dr. Sheryle  and last seen 6 months ago.     New problem(s): None   PCP is Sheryle Carwin, MD.  Allergies  Allergen Reactions   Adalimumab Rash and Other (See Comments)    FEVER, Fast pulse Humira   Imuran [Azathioprine Sodium] Rash    Denies Airway involvement   Methotrexate Other (See Comments)    Liver Enzyme elevation - after 1 month of use Other reaction(s): Unknown   Plaquenil [Hydroxychloroquine Sulfate] Rash    Denies airway involvement.    Sulfonamide Derivatives Rash    Occurred with Sulfasalazine. Rash only.    Azathioprine     Other reaction(s): Unknown   Ceftin [Cefuroxime Axetil] Nausea Only    Upset stomach   Lisinopril     Other reaction(s): Unknown   Sulfamethoxazole-Trimethoprim     Other reaction(s): Unknown   Morphine Nausea And Vomiting    IV morphine caused N and V  After surgery.   Has tolerated Kadian in the past     Review of Systems: Negative except as noted in the HPI.   Objective:  Tonya Hall is a pleasant 73 y.o. female in NAD. AAO x 3.  Vascular Examination: Vascular status intact b/l with palpable pedal pulses. CFT immediate b/l. No edema. No pain with calf compression b/l. Skin temperature gradient WNL b/l.  Purlple hue consistent with Raynaud's b/l.  Neurological Examination: Sensation grossly intact b/l with 10 gram monofilament. Vibratory sensation intact b/l.   Dermatological Examination: Pedal skin with normal turgor, texture and tone b/l. Toenails 1-5 b/l thick, discolored, elongated with  subungual debris and pain on dorsal palpation.Porokeratotic lesion(s) submet head 2 right foot, submet head 3 b/l, and submet head 4 right foot. No erythema, no edema, no drainage, no fluctuance.  Musculoskeletal Examination: Muscle strength 5/5 to b/l LE. HAV with bunion deformity noted b/l LE. Hammertoe deformity noted 2-5 b/l.  Radiographs: None  Last A1c:      Latest Ref Rng & Units 04/01/2023   12:17 PM  Hemoglobin A1C  Hemoglobin-A1c <5.7 % of total Hgb 6.5      Assessment:   1. Corns and callosities   2. Diabetic peripheral neuropathy associated with type 2 diabetes mellitus (HCC)    Plan:  -Patient was evaluated today. All questions/concerns addressed on today's visit. -Continue foot and shoe inspections daily. Monitor blood glucose per PCP/Endocrinologist's recommendations. -Porokeratotic lesion(s) submet head 2 right foot, submet head 3 b/l, and submet head 4 right foot pared and enucleated with sterile currette without incident. Total number of lesions debrided=4. -Patient/POA to call should there be question/concern in the interim.  Return in about 4 months (around 09/04/2023).  Delon LITTIE Merlin, DPM       LOCATION: [address]  [place], [place] [address]                   Office [phone]   [facility] LOCATION: [address] [place] [address] [place], [place] [address] Office [phone]

## 2023-05-14 ENCOUNTER — Other Ambulatory Visit: Payer: Self-pay

## 2023-05-14 DIAGNOSIS — R1010 Upper abdominal pain, unspecified: Secondary | ICD-10-CM

## 2023-05-14 DIAGNOSIS — R112 Nausea with vomiting, unspecified: Secondary | ICD-10-CM

## 2023-05-15 ENCOUNTER — Encounter: Payer: PPO | Attending: Endocrinology | Admitting: Physician Assistant

## 2023-05-15 ENCOUNTER — Encounter: Payer: Self-pay | Admitting: Physician Assistant

## 2023-05-15 VITALS — BP 162/76 | HR 71 | Resp 13 | Ht <= 58 in | Wt 119.0 lb

## 2023-05-15 DIAGNOSIS — G8929 Other chronic pain: Secondary | ICD-10-CM | POA: Insufficient documentation

## 2023-05-15 DIAGNOSIS — M7062 Trochanteric bursitis, left hip: Secondary | ICD-10-CM | POA: Insufficient documentation

## 2023-05-15 DIAGNOSIS — M65949 Unspecified synovitis and tenosynovitis, unspecified hand: Secondary | ICD-10-CM

## 2023-05-15 DIAGNOSIS — I10 Essential (primary) hypertension: Secondary | ICD-10-CM | POA: Insufficient documentation

## 2023-05-15 DIAGNOSIS — K52831 Collagenous colitis: Secondary | ICD-10-CM | POA: Insufficient documentation

## 2023-05-15 DIAGNOSIS — M7061 Trochanteric bursitis, right hip: Secondary | ICD-10-CM | POA: Insufficient documentation

## 2023-05-15 DIAGNOSIS — M51369 Other intervertebral disc degeneration, lumbar region without mention of lumbar back pain or lower extremity pain: Secondary | ICD-10-CM | POA: Insufficient documentation

## 2023-05-15 DIAGNOSIS — Z79899 Other long term (current) drug therapy: Secondary | ICD-10-CM | POA: Insufficient documentation

## 2023-05-15 DIAGNOSIS — I7300 Raynaud's syndrome without gangrene: Secondary | ICD-10-CM | POA: Diagnosis not present

## 2023-05-15 DIAGNOSIS — M5134 Other intervertebral disc degeneration, thoracic region: Secondary | ICD-10-CM | POA: Diagnosis not present

## 2023-05-15 DIAGNOSIS — M5030 Other cervical disc degeneration, unspecified cervical region: Secondary | ICD-10-CM | POA: Insufficient documentation

## 2023-05-15 DIAGNOSIS — G894 Chronic pain syndrome: Secondary | ICD-10-CM | POA: Diagnosis not present

## 2023-05-15 DIAGNOSIS — Z9049 Acquired absence of other specified parts of digestive tract: Secondary | ICD-10-CM | POA: Insufficient documentation

## 2023-05-15 DIAGNOSIS — E2740 Unspecified adrenocortical insufficiency: Secondary | ICD-10-CM | POA: Insufficient documentation

## 2023-05-15 DIAGNOSIS — M810 Age-related osteoporosis without current pathological fracture: Secondary | ICD-10-CM | POA: Insufficient documentation

## 2023-05-15 DIAGNOSIS — Z8719 Personal history of other diseases of the digestive system: Secondary | ICD-10-CM | POA: Insufficient documentation

## 2023-05-15 DIAGNOSIS — L4050 Arthropathic psoriasis, unspecified: Secondary | ICD-10-CM | POA: Diagnosis not present

## 2023-05-15 DIAGNOSIS — L409 Psoriasis, unspecified: Secondary | ICD-10-CM | POA: Diagnosis not present

## 2023-05-15 DIAGNOSIS — M533 Sacrococcygeal disorders, not elsewhere classified: Secondary | ICD-10-CM | POA: Diagnosis not present

## 2023-05-15 DIAGNOSIS — M170 Bilateral primary osteoarthritis of knee: Secondary | ICD-10-CM | POA: Insufficient documentation

## 2023-05-15 DIAGNOSIS — D692 Other nonthrombocytopenic purpura: Secondary | ICD-10-CM | POA: Insufficient documentation

## 2023-05-15 DIAGNOSIS — K224 Dyskinesia of esophagus: Secondary | ICD-10-CM | POA: Insufficient documentation

## 2023-05-15 NOTE — Patient Instructions (Signed)
Standing Labs We placed an order today for your standing lab work.   Please have your standing labs drawn at end of March and every 3 months   Please have your labs drawn 2 weeks prior to your appointment so that the provider can discuss your lab results at your appointment, if possible.  Please note that you may see your imaging and lab results in MyChart before we have reviewed them. We will contact you once all results are reviewed. Please allow our office up to 72 hours to thoroughly review all of the results before contacting the office for clarification of your results.  WALK-IN LAB HOURS  Monday through Thursday from 8:00 am -12:30 pm and 1:00 pm-5:00 pm and Friday from 8:00 am-12:00 pm.  Patients with office visits requiring labs will be seen before walk-in labs.  You may encounter longer than normal wait times. Please allow additional time. Wait times may be shorter on  Monday and Thursday afternoons.  We do not book appointments for walk-in labs. We appreciate your patience and understanding with our staff.   Labs are drawn by [facility]. Please bring your co-pay at the time of your lab draw.  You may receive a bill from [facility] for your lab work.  Please note if you are on Hydroxychloroquine and and an order has been placed for a Hydroxychloroquine level,  you will need to have it drawn 4 hours or more after your last dose.  If you wish to have your labs drawn at another location, please call the office 24 hours in advance so we can fax the orders.  The office is located at [address], [place], [place] [address]   If you have any questions regarding directions or hours of operation,  please call [phone].   As a reminder, please drink plenty of water prior to coming for your lab work. Thanks!

## 2023-05-21 ENCOUNTER — Encounter: Payer: PPO | Attending: Physical Medicine and Rehabilitation | Admitting: Registered Nurse

## 2023-05-21 ENCOUNTER — Encounter: Payer: Self-pay | Admitting: Registered Nurse

## 2023-05-21 VITALS — BP 172/76 | HR 94 | Ht <= 58 in | Wt 118.0 lb

## 2023-05-21 DIAGNOSIS — Z79891 Long term (current) use of opiate analgesic: Secondary | ICD-10-CM | POA: Diagnosis not present

## 2023-05-21 DIAGNOSIS — I10 Essential (primary) hypertension: Secondary | ICD-10-CM | POA: Diagnosis not present

## 2023-05-21 DIAGNOSIS — Z5181 Encounter for therapeutic drug level monitoring: Secondary | ICD-10-CM | POA: Diagnosis not present

## 2023-05-21 DIAGNOSIS — M5134 Other intervertebral disc degeneration, thoracic region: Secondary | ICD-10-CM | POA: Diagnosis not present

## 2023-05-21 DIAGNOSIS — M150 Primary generalized (osteo)arthritis: Secondary | ICD-10-CM | POA: Insufficient documentation

## 2023-05-21 DIAGNOSIS — G894 Chronic pain syndrome: Secondary | ICD-10-CM | POA: Insufficient documentation

## 2023-05-21 DIAGNOSIS — M7061 Trochanteric bursitis, right hip: Secondary | ICD-10-CM | POA: Diagnosis not present

## 2023-05-21 DIAGNOSIS — L4050 Arthropathic psoriasis, unspecified: Secondary | ICD-10-CM | POA: Diagnosis not present

## 2023-05-21 DIAGNOSIS — M7062 Trochanteric bursitis, left hip: Secondary | ICD-10-CM | POA: Diagnosis not present

## 2023-05-21 DIAGNOSIS — M542 Cervicalgia: Secondary | ICD-10-CM | POA: Diagnosis not present

## 2023-05-21 DIAGNOSIS — M24549 Contracture, unspecified hand: Secondary | ICD-10-CM

## 2023-05-21 MED ORDER — HYDROCODONE-ACETAMINOPHEN 7.5-325 MG PO TABS: 1.0000 | ORAL_TABLET | Freq: Four times a day (QID) | ORAL | 0 refills | Status: DC | PRN

## 2023-05-21 NOTE — Progress Notes (Unsigned)
Subjective:    Patient ID: Tonya Hall, female    DOB: 1950/09/27, 73 y.o.   MRN: [id number]  HPI: Tonya Hall is a 73 y.o. female who returns for follow up appointment for chronic pain and medication refill. states *** pain is located in  ***. rates pain ***. current exercise regime is walking and performing stretching exercises.  [name] Morphine equivalent is *** MME.   UDS was ordered     Pain Inventory Average Pain 8 Pain Right Now 4 My pain is intermittent, dull, and aching  In the last 24 hours, has pain interfered with the following? General activity 7 Relation with others 6 Enjoyment of life {NUMBERS; 0-10:5044} What TIME of day is your pain at its worst? daytime Sleep (in general) Fair  Pain is worse with: some activites Pain improves with: rest, heat/ice, pacing activities, and medication Relief from Meds: 4  Family History  Problem Relation Age of Onset   Diabetes Mother    Pancreatitis Mother    Arthritis Mother        psoriatic    Fibromyalgia Mother    Rheumatic fever Father    Diabetes Other        grandparents   Skin cancer Other        grandfather   Hypertension Other        grandparent   Congestive Heart Failure Other        grandfather   Parkinson's disease Other        grandmother   Colon cancer Maternal Uncle    Fibromyalgia Sister    Rheum arthritis Sister    Diabetes Sister    Fibromyalgia Sister    Diabetes Sister    Rheum arthritis Sister    Hypertension Son    Colon polyps Neg Hx    Social History   Socioeconomic History   Marital status: Married    Spouse name: Not on file   Number of children: Not on file   Years of education: Not on file   Highest education level: Not on file  Occupational History   Occupation: Case Midwife    Employer: [employer]    Comment: Estate manager/land agent  Tobacco Use   Smoking status: Never    Passive exposure: Never   Smokeless tobacco: Never  Vaping Use   Vaping status: Never Used   Substance and Sexual Activity   Alcohol use: No   Drug use: No   Sexual activity: Not Currently    Partners: Male    Birth control/protection: Surgical    Comment: BTL  Other Topics Concern   Not on file  Social History Narrative   ATTENDS [employer]. 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   Social Drivers of Corporate investment banker Strain: Not on file  Food Insecurity: Not on file  Transportation Needs: Not on file  Physical Activity: Not on file  Stress: Not on file  Social Connections: Not on file   Past Surgical History:  Procedure Laterality Date   ANTERIOR CERVICAL DECOMP/DISCECTOMY FUSION N/A 03/09/2020   Procedure: ANTERIOR CERVICAL DECOMPRESSION/DISCECTOMY FUSION, INTERBODY PROSTHESIS, PLATE SCREWS CERVICAL FIVE-SIX, CERVICAL SIX-SEVEN;  Surgeon: Tressie Stalker, MD;  Location: [facility] OR;  Service: Neurosurgery;  Laterality: N/A;   BACK SURGERY  2003   BALLOON DILATION N/A 11/12/2022   Procedure: BALLOON DILATION;  Surgeon: Lanelle Bal, DO;  [facility];  Service: Endoscopy;  Laterality: N/A;   BIOPSY  04/17/2016   Procedure:  BIOPSY;  Surgeon: West Bali, MD;  [facility];  Service: Endoscopy;;  random colon bx's   BIOPSY  11/12/2022   Procedure: BIOPSY;  Surgeon: Lanelle Bal, DO;  [facility];  Service: Endoscopy;;   CERVICAL SPINE SURGERY  09/09/2017   CESAREAN SECTION  1974, 1978       CHOLECYSTECTOMY  04/2023   COLONOSCOPY  06/19/2008   [id number] internal hemorrhoids/mild sigmoin colon diverticulosis   COLONOSCOPY  2012   Dr. Jena Gauss: normal rectum, diverticula, lymphocytic colitis    COLONOSCOPY WITH PROPOFOL N/A 04/17/2016   Dr. Darrick Penna: Normal terminal ileum, internal/external hemorrhoids, random colon biopsies consistent with collagenous colitis   DILATION AND CURETTAGE OF UTERUS  1977   spontaneous abortion   ESOPHAGOGASTRODUODENOSCOPY  06/19/2008   [id number] gastritis   ESOPHAGOGASTRODUODENOSCOPY  (EGD) WITH PROPOFOL N/A 11/12/2022   Procedure: ESOPHAGOGASTRODUODENOSCOPY (EGD) WITH PROPOFOL;  Surgeon: Lanelle Bal, DO;  [facility];  Service: Endoscopy;  Laterality: N/A;  8:30 am, asa 3   LUMBAR DISC ARTHROPLASTY  09/1996   LUMBAR FUSION  6/03, 11/08, 11/14   L4-5 fusion, L2-3 fusion, L1-2   LUMBAR FUSION  08/03/2020   L4-L5   LUMBAR LAMINECTOMY  03/2020   MOUTH SURGERY  12/07/2021   oral surgery   ODONTOID SCREW INSERTION N/A 01/27/2018   Procedure: ODONTOID SCREW INSERTION;  Surgeon: Tressie Stalker, MD;  Location[facility] OR;  Service: Neurosurgery;  Laterality: N/A;  ODONTOID SCREW INSERTION   TONSILLECTOMY     TONSILLECTOMY AND ADENOIDECTOMY     TUBAL LIGATION     Past Surgical History:  Procedure Laterality Date   ANTERIOR CERVICAL DECOMP/DISCECTOMY FUSION N/A 03/09/2020   Procedure: ANTERIOR CERVICAL DECOMPRESSION/DISCECTOMY FUSION, INTERBODY PROSTHESIS, PLATE SCREWS CERVICAL FIVE-SIX, CERVICAL SIX-SEVEN;  Surgeon: Tressie Stalker, MD;  Location: [facility] OR;  Service: Neurosurgery;  Laterality: N/A;   BACK SURGERY  2003   BALLOON DILATION N/A 11/12/2022   Procedure: BALLOON DILATION;  Surgeon: Lanelle Bal, DO;  [facility];  Service: Endoscopy;  Laterality: N/A;   BIOPSY  04/17/2016   Procedure: BIOPSY;  Surgeon: West Bali, MD;  [facility];  Service: Endoscopy;;  random colon bx's   BIOPSY  11/12/2022   Procedure: BIOPSY;  Surgeon: Lanelle Bal, DO;  [facility];  Service: Endoscopy;;   CERVICAL SPINE SURGERY  09/09/2017   CESAREAN SECTION  1974, 1978       CHOLECYSTECTOMY  04/2023   COLONOSCOPY  06/19/2008   [id number] internal hemorrhoids/mild sigmoin colon diverticulosis   COLONOSCOPY  2012   Dr. Jena Gauss: normal rectum, diverticula, lymphocytic colitis    COLONOSCOPY WITH PROPOFOL N/A 04/17/2016   Dr. Darrick Penna: Normal terminal ileum, internal/external hemorrhoids, random colon biopsies consistent with  collagenous colitis   DILATION AND CURETTAGE OF UTERUS  1977   spontaneous abortion   ESOPHAGOGASTRODUODENOSCOPY  06/19/2008   [id number] gastritis   ESOPHAGOGASTRODUODENOSCOPY (EGD) WITH PROPOFOL N/A 11/12/2022   Procedure: ESOPHAGOGASTRODUODENOSCOPY (EGD) WITH PROPOFOL;  Surgeon: Lanelle Bal, DO;  [facility];  Service: Endoscopy;  Laterality: N/A;  8:30 am, asa 3   LUMBAR DISC ARTHROPLASTY  09/1996   LUMBAR FUSION  6/03, 11/08, 11/14   L4-5 fusion, L2-3 fusion, L1-2   LUMBAR FUSION  08/03/2020   L4-L5   LUMBAR LAMINECTOMY  03/2020   MOUTH SURGERY  12/07/2021   oral surgery   ODONTOID SCREW INSERTION N/A 01/27/2018   Procedure: ODONTOID SCREW INSERTION;  Surgeon:  Tressie Stalker, MD;  Location: [facility] OR;  Service: Neurosurgery;  Laterality: N/A;  ODONTOID SCREW INSERTION   TONSILLECTOMY     TONSILLECTOMY AND ADENOIDECTOMY     TUBAL LIGATION     Past Medical History:  Diagnosis Date   Asthma    Albuterol in haler prn   Cataract    immature unsure which eye   Chronic back pain    COVID-19 October/November 2020   Degenerative disk disease    psoriatic   Diabetes mellitus without complication (HCC)    diet controlled   Esophageal motility disorder    Non-specific, see modified barium study/speech path, BP   GERD (gastroesophageal reflux disease)    takes Protonix bid   History of blood transfusion    post c-section   History of bronchitis    HTN (hypertension)    Hyperlipidemia    takes Pravastatin daily   Lymphocytic colitis 05/26/2010   Responded to Entocort x 3 MOS   Neuropathy    Nonallergic rhinitis    Osteoporosis    gets Boniva every 3 months   Peripheral edema    takes HCTZ daily   Pneumonia 02/2019   Psoriatic arthritis (HCC)    Dr. Anthonette Legato   Psoriatic arthritis [facility])    bilateral hands   Raynaud's disease    Seasonal allergies    Shingles 2020   SUI (stress urinary incontinence, female)    Urinary urgency    BP (!) 182/84    Pulse 94   Ht 4\' 10"  (1.473 m)   Wt 118 lb (53.5 kg)   LMP 02/01/1999   SpO2 95%   BMI 24.66 kg/m   Opioid Risk Score:   Fall Risk Score:  `1  Depression screen [facility] 2/9     05/21/2023    9:11 AM 03/20/2023    9:50 AM 02/20/2023    1:05 PM 12/13/2022   10:04 AM 09/19/2022   10:21 AM 06/15/2022    9:42 AM 12/12/2020   10:27 AM  Depression screen PHQ 2/9  Decreased Interest 0 0 0 0 0 0 0  Down, Depressed, Hopeless 0 0 0 0 0 0 0  PHQ - 2 Score 0 0 0 0 0 0 0  Altered sleeping      0   Tired, decreased energy      2   Change in appetite      0   Feeling bad or failure about yourself       0   Trouble concentrating      0   Moving slowly or fidgety/restless      1   Suicidal thoughts      0   PHQ-9 Score      3   Difficult doing work/chores      Very difficult      Review of Systems  Musculoskeletal:  Positive for back pain and gait problem.  All other systems reviewed and are negative.      Objective:   Physical Exam        Assessment & Plan:

## 2023-05-23 DIAGNOSIS — M5134 Other intervertebral disc degeneration, thoracic region: Secondary | ICD-10-CM | POA: Diagnosis not present

## 2023-05-23 DIAGNOSIS — I10 Essential (primary) hypertension: Secondary | ICD-10-CM | POA: Diagnosis not present

## 2023-05-24 LAB — TOXASSURE SELECT,+ANTIDEPR,UR

## 2023-05-28 DIAGNOSIS — R112 Nausea with vomiting, unspecified: Secondary | ICD-10-CM | POA: Diagnosis not present

## 2023-05-28 DIAGNOSIS — M4804 Spinal stenosis, thoracic region: Secondary | ICD-10-CM | POA: Diagnosis not present

## 2023-05-28 DIAGNOSIS — M5124 Other intervertebral disc displacement, thoracic region: Secondary | ICD-10-CM | POA: Diagnosis not present

## 2023-05-28 DIAGNOSIS — M546 Pain in thoracic spine: Secondary | ICD-10-CM | POA: Diagnosis not present

## 2023-05-28 DIAGNOSIS — R1010 Upper abdominal pain, unspecified: Secondary | ICD-10-CM | POA: Diagnosis not present

## 2023-05-29 LAB — HEPATIC FUNCTION PANEL
ALT: 75 [IU]/L — ABNORMAL HIGH (ref 0–32)
AST: 45 [IU]/L — ABNORMAL HIGH (ref 0–40)
Albumin: 4 g/dL (ref 3.8–4.8)
Alkaline Phosphatase: 81 [IU]/L (ref 44–121)
Bilirubin Total: <0.2 mg/dL (ref 0.0–1.2)
Bilirubin, Direct: <0.08 mg/dL (ref 0.00–0.40)
Total Protein: 5.8 g/dL — ABNORMAL LOW (ref 6.0–8.5)

## 2023-06-04 ENCOUNTER — Other Ambulatory Visit: Payer: Self-pay | Admitting: Physician Assistant

## 2023-06-04 NOTE — Telephone Encounter (Signed)
 Last Fill: 02/25/2023  Labs: 04/01/2023 CBC stable. Creatinine is elevated-1.04 and GFR is low-57. ALT is borderline elevated-37.   Next Visit: 10/30/2023  Last Visit: 05/15/2023  DX: Psoriatic arthritis   Current Dose per office note 05/15/2023: Ranae Plumber 20mg  1 tablet daily   Okay to refill Arava ?

## 2023-06-08 NOTE — Progress Notes (Unsigned)
 GI Office Note    Referring Provider: Carylon Perches, MD Primary Care Physician:  Carylon Perches, MD  Primary Gastroenterologist: Hennie Duos. Marletta Lor, DO   Chief Complaint   No chief complaint on file.   History of Present Illness   Tonya Hall is a 73 y.o. female presenting today for follow up. Since we saw patient back in 03/2023, she had underwent cholecystectomy.  Elevated LFTs, ?medication related (leflunomide and nortriptyline notes to have increase in AST/ALT up to 15/16% each).   Component Ref Range & Units (hover) 11 d ago (05/28/23) 2 mo ago (04/01/23) 5 mo ago (12/20/22) 8 mo ago (09/20/22) 11 mo ago (06/20/22) 1 yr ago (03/19/22) 1 yr ago (12/18/21)  Total Protein 5.8 Low  6.4 R 6.4 R 6.4 R 7.2 R 6.4 R 6.3 R  Albumin 4.0        Bilirubin Total <0.2 0.3 R 0.3 R 0.3 R 0.3 R 0.2 R 0.2 R  Bilirubin, Direct <0.08        Alkaline Phosphatase 81        AST 45 High  23 R 18 R 28 R 20 R 19 R 22 R  ALT 75 High  37 High  R 22 R 32 High  R 24 R       RUQ [place] 02/21/2023 with hepatic steatosis otherwise unremarkable.   EGD 11/2022: -small hiatal hernia -mild Schatzki ring s/p dilation -gastritis, no h.pylori   Colonoscopy 04/2016: -distal ileum norma -internal/external hemorrhoids -repeat colonoscopy 10 years.       Medications   Current Outpatient Medications  Medication Sig Dispense Refill   albuterol (PROVENTIL HFA;VENTOLIN HFA) 108 (90 BASE) MCG/ACT inhaler Inhale 2 puffs into the lungs every 6 (six) hours as needed for wheezing or shortness of breath.      Apremilast (OTEZLA) 30 MG TABS Take 1 tablet (30 mg total) by mouth 2 (two) times daily. 180 tablet 0   aspirin 81 MG tablet Take 81 mg by mouth daily.     Blood Glucose Monitoring Suppl (ONETOUCH VERIO FLEX SYSTEM) w/Device KIT      cetirizine (ZYRTEC) 10 MG tablet Take 10 mg by mouth daily as needed (allergies).     cholecalciferol (VITAMIN D3) 25 MCG (1000 UNIT) tablet Take 1,000 Units by mouth at  bedtime.     empagliflozin (JARDIANCE) 10 MG TABS tablet Take 10 mg by mouth daily.     fish oil-omega-3 fatty acids 1000 MG capsule Take 2 capsules (2 g total) by mouth daily. 2-4 GRAMS DAILY (Patient taking differently: Take 1 g by mouth at bedtime. 2-4 GRAMS DAILY)     gabapentin (NEURONTIN) 600 MG tablet Take 1 tablet (600 mg total) by mouth 3 (three) times daily. For nerve pain 90 tablet 5   Ginger, Zingiber officinalis, (GINGER ROOT) 500 MG CAPS Take 500 mg by mouth daily.     GLUCOSAMINE SULFATE PO Take 1,500 mg by mouth daily.     HYDROcodone-acetaminophen (NORCO) 7.5-325 MG tablet Take 1 tablet by mouth every 6 (six) hours as needed for moderate pain (pain score 4-6) or severe pain (pain score 7-10). For chronic pain 90 tablet 0   hydrocortisone sodium succinate (SOLU-CORTEF) 100 MG SOLR injection Inject 100 mg into the muscle daily as needed (when unable to keep down prednisone tablet).      ixekizumab (TALTZ) 80 MG/ML pen Inject 1 mL (80 mg total) into the skin every 28 (twenty-eight) days. 3 mL 0  Lancets (ONETOUCH DELICA PLUS LANCET33G) MISC USE TO TEST 3 TIMESUDAILY.     leflunomide (ARAVA) 20 MG tablet TAKE 1 TABLET BY MOUTH ONCE DAILY. 90 tablet 0   lidocaine (XYLOCAINE) 5 % ointment Apply 1 Application topically as needed. For hand pain due to psoriatic arthritis- 50 g 5   losartan (COZAAR) 100 MG tablet Take 100 mg by mouth daily.     metoprolol succinate (TOPROL-XL) 50 MG 24 hr tablet Take 1 tablet (50 mg total) by mouth daily. Take with or immediately following a meal.     Misc Natural Products (TART CHERRY ADVANCED PO) Take 3,000 mg by mouth daily. daily     Multiple Vitamin (MULTIVITAMIN) tablet Take 1 tablet by mouth 3 (three) times a week.     nortriptyline (PAMELOR) 25 MG capsule TAKE (1) CAPSULE BY MOUTH AT BEDTIME. 30 capsule 0   ONETOUCH VERIO test strip      oxybutynin (DITROPAN-XL) 5 MG 24 hr tablet Take 1 tablet (5 mg total) by mouth at bedtime. 90 tablet 3    pantoprazole (PROTONIX) 40 MG tablet TAKE (1) TABLET BY MOUTH TWICE DAILY. 180 tablet 3   Polyethyl Glycol-Propyl Glycol (SYSTANE OP) Place 1 drop into both eyes 4 (four) times daily as needed (dry eyes).     potassium chloride (KLOR-CON) 20 MEQ packet Take 20 mEq by mouth daily.     predniSONE (DELTASONE) 5 MG tablet TAKE 1 TABLET BY MOUTH DAILY WITH BREAKFAST 90 tablet 0   spironolactone (ALDACTONE) 25 MG tablet Take 25 mg by mouth daily.     topiramate (TOPAMAX) 200 MG tablet TAKE (1) TABLET BY MOUTH ONCE AT BEDTIME. 90 tablet 0   Turmeric 500 MG CAPS Take 500 mg by mouth every morning.     No current facility-administered medications for this visit.    Allergies   Allergies as of 06/10/2023 - Review Complete 05/21/2023  Allergen Reaction Noted   Adalimumab Rash and Other (See Comments) 05/09/2021   Imuran [azathioprine sodium] Rash 07/03/2011   Methotrexate Other (See Comments) 05/09/2021   Plaquenil [hydroxychloroquine sulfate] Rash 07/03/2011   Sulfonamide derivatives Rash    Azathioprine  05/09/2021   Ceftin [cefuroxime axetil] Nausea Only 07/06/2014   Lisinopril  09/22/2021   Sulfamethoxazole-trimethoprim  09/22/2021   Morphine Nausea And Vomiting      Past Medical History   Past Medical History:  Diagnosis Date   Asthma    Albuterol in haler prn   Cataract    immature unsure which eye   Chronic back pain    COVID-19 October/November 2020   Degenerative disk disease    psoriatic   Diabetes mellitus without complication (HCC)    diet controlled   Esophageal motility disorder    Non-specific, see modified barium study/speech path, BP   GERD (gastroesophageal reflux disease)    takes Protonix bid   History of blood transfusion    post c-section   History of bronchitis    HTN (hypertension)    Hyperlipidemia    takes Pravastatin daily   Lymphocytic colitis 05/26/2010   Responded to Entocort x 3 MOS   Neuropathy    Nonallergic rhinitis    Osteoporosis     gets Boniva every 3 months   Peripheral edema    takes HCTZ daily   Pneumonia 02/2019   Psoriatic arthritis (HCC)    Dr. Anthonette Legato   Psoriatic arthritis [facility])    bilateral hands   Raynaud's disease    Seasonal allergies  Shingles 2020   SUI (stress urinary incontinence, female)    Urinary urgency     Past Surgical History   Past Surgical History:  Procedure Laterality Date   ANTERIOR CERVICAL DECOMP/DISCECTOMY FUSION N/A 03/09/2020   Procedure: ANTERIOR CERVICAL DECOMPRESSION/DISCECTOMY FUSION, INTERBODY PROSTHESIS, PLATE SCREWS CERVICAL FIVE-SIX, CERVICAL SIX-SEVEN;  Surgeon: Tressie Stalker, MD;  Location: [facility] OR;  Service: Neurosurgery;  Laterality: N/A;   BACK SURGERY  2003   BALLOON DILATION N/A 11/12/2022   Procedure: BALLOON DILATION;  Surgeon: Lanelle Bal, DO;  [facility];  Service: Endoscopy;  Laterality: N/A;   BIOPSY  04/17/2016   Procedure: BIOPSY;  Surgeon: West Bali, MD;  [facility];  Service: Endoscopy;;  random colon bx's   BIOPSY  11/12/2022   Procedure: BIOPSY;  Surgeon: Lanelle Bal, DO;  [facility];  Service: Endoscopy;;   CERVICAL SPINE SURGERY  09/09/2017   CESAREAN SECTION  1974, 1978       CHOLECYSTECTOMY  04/2023   COLONOSCOPY  06/19/2008   [id number] internal hemorrhoids/mild sigmoin colon diverticulosis   COLONOSCOPY  2012   Dr. Jena Gauss: normal rectum, diverticula, lymphocytic colitis    COLONOSCOPY WITH PROPOFOL N/A 04/17/2016   Dr. Darrick Penna: Normal terminal ileum, internal/external hemorrhoids, random colon biopsies consistent with collagenous colitis   DILATION AND CURETTAGE OF UTERUS  1977   spontaneous abortion   ESOPHAGOGASTRODUODENOSCOPY  06/19/2008   [id number] gastritis   ESOPHAGOGASTRODUODENOSCOPY (EGD) WITH PROPOFOL N/A 11/12/2022   Procedure: ESOPHAGOGASTRODUODENOSCOPY (EGD) WITH PROPOFOL;  Surgeon: Lanelle Bal, DO;  [facility];  Service: Endoscopy;  Laterality:  N/A;  8:30 am, asa 3   LUMBAR DISC ARTHROPLASTY  09/1996   LUMBAR FUSION  6/03, 11/08, 11/14   L4-5 fusion, L2-3 fusion, L1-2   LUMBAR FUSION  08/03/2020   L4-L5   LUMBAR LAMINECTOMY  03/2020   MOUTH SURGERY  12/07/2021   oral surgery   ODONTOID SCREW INSERTION N/A 01/27/2018   Procedure: ODONTOID SCREW INSERTION;  Surgeon: Tressie Stalker, MD;  Location: [facility] OR;  Service: Neurosurgery;  Laterality: N/A;  ODONTOID SCREW INSERTION   TONSILLECTOMY     TONSILLECTOMY AND ADENOIDECTOMY     TUBAL LIGATION      Past Family History   Family History  Problem Relation Age of Onset   Diabetes Mother    Pancreatitis Mother    Arthritis Mother        psoriatic    Fibromyalgia Mother    Rheumatic fever Father    Diabetes Other        grandparents   Skin cancer Other        grandfather   Hypertension Other        grandparent   Congestive Heart Failure Other        grandfather   Parkinson's disease Other        grandmother   Colon cancer Maternal Uncle    Fibromyalgia Sister    Rheum arthritis Sister    Diabetes Sister    Fibromyalgia Sister    Diabetes Sister    Rheum arthritis Sister    Hypertension Son    Colon polyps Neg Hx     Past Social History   Social History   Socioeconomic History   Marital status: Married    Spouse name: Not on file   Number of children: Not on file   Years of education: Not on file   Highest education level: Not on file  Occupational History   Occupation: Case Midwife: [employer]    Comment: Estate manager/land agent  Tobacco Use   Smoking status: Never    Passive exposure: Never   Smokeless tobacco: Never  Vaping Use   Vaping status: Never Used  Substance and Sexual Activity   Alcohol use: No   Drug use: No   Sexual activity: Not Currently    Partners: Male    Birth control/protection: Surgical    Comment: BTL  Other Topics Concern   Not on file  Social History Narrative   ATTENDS [employer]. 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   Social Drivers of Corporate investment banker Strain: Not on file  Food Insecurity: Not on file  Transportation Needs: Not on file  Physical Activity: Not on file  Stress: Not on file  Social Connections: Not on file  Intimate Partner Violence: Not on file    Review of Systems   General: Negative for anorexia, weight loss, fever, chills, fatigue, weakness. ENT: Negative for hoarseness, difficulty swallowing , nasal congestion. CV: Negative for chest pain, angina, palpitations, dyspnea on exertion, peripheral edema.  Respiratory: Negative for dyspnea at rest, dyspnea on exertion, cough, sputum, wheezing.  GI: See history of present illness. GU:  Negative for dysuria, hematuria, urinary incontinence, urinary frequency, nocturnal urination.  Endo: Negative for unusual weight change.     Physical Exam   LMP 02/01/1999    General: Well-nourished, well-developed in no acute distress.  Eyes: No icterus. Mouth: Oropharyngeal mucosa moist and pink , no lesions erythema or exudate. Lungs: Clear to auscultation bilaterally.  Heart: Regular rate and rhythm, no murmurs rubs or gallops.  Abdomen: Bowel sounds are normal, nontender, nondistended, no hepatosplenomegaly or masses,  no abdominal bruits or hernia , no rebound or guarding.  Rectal: ***  Extremities: No lower extremity edema. No clubbing or deformities. Neuro: Alert and oriented x 4   Skin: Warm and dry, no jaundice.   Psych: Alert and cooperative, normal mood and affect.  Labs   *** Imaging Studies   No results found.  Assessment       PLAN   ***   Leanna Battles. Melvyn Neth, MHS, PA-C [facility]

## 2023-06-10 ENCOUNTER — Encounter: Payer: Self-pay | Admitting: Gastroenterology

## 2023-06-10 ENCOUNTER — Ambulatory Visit: Payer: PPO | Admitting: Gastroenterology

## 2023-06-10 ENCOUNTER — Encounter: Payer: Self-pay | Admitting: *Deleted

## 2023-06-10 ENCOUNTER — Encounter: Payer: Self-pay | Admitting: Rheumatology

## 2023-06-10 VITALS — BP 124/68 | HR 79 | Temp 97.8°F | Ht <= 58 in | Wt 118.6 lb

## 2023-06-10 DIAGNOSIS — R7989 Other specified abnormal findings of blood chemistry: Secondary | ICD-10-CM | POA: Diagnosis not present

## 2023-06-10 DIAGNOSIS — M549 Dorsalgia, unspecified: Secondary | ICD-10-CM

## 2023-06-10 DIAGNOSIS — K449 Diaphragmatic hernia without obstruction or gangrene: Secondary | ICD-10-CM

## 2023-06-10 DIAGNOSIS — K2289 Other specified disease of esophagus: Secondary | ICD-10-CM | POA: Diagnosis not present

## 2023-06-10 DIAGNOSIS — Z79899 Other long term (current) drug therapy: Secondary | ICD-10-CM

## 2023-06-10 DIAGNOSIS — Z8719 Personal history of other diseases of the digestive system: Secondary | ICD-10-CM

## 2023-06-10 DIAGNOSIS — R1010 Upper abdominal pain, unspecified: Secondary | ICD-10-CM | POA: Insufficient documentation

## 2023-06-10 DIAGNOSIS — R7401 Elevation of levels of liver transaminase levels: Secondary | ICD-10-CM

## 2023-06-10 DIAGNOSIS — R152 Fecal urgency: Secondary | ICD-10-CM | POA: Diagnosis not present

## 2023-06-10 DIAGNOSIS — K219 Gastro-esophageal reflux disease without esophagitis: Secondary | ICD-10-CM | POA: Diagnosis not present

## 2023-06-10 NOTE — Telephone Encounter (Signed)
 Hepatic function panel: 05/28/2023  Total protein 5.8 AST 45 ALT 75  Medications prescribed by our office: Taltz 80 mg sq injections q 4 weeks, Arava 20mg  1 tablet daily, Otezla 30 mg 1 tablet by mouth twice daily, and prednisone 5 mg 1 tablet daily.  Topamax 200mg  at bedtime  (nortriptyline 25mg  at bedtime prescribed by PCP according to dispense history)   Please advise.

## 2023-06-10 NOTE — Patient Instructions (Signed)
 Please reach out to rheumatology regarding bump in liver numbers and see if they advise you to hold leflunomide temporarily. Let me know if they update liver labs, if not we will need to. MRI planned.

## 2023-06-10 NOTE — Telephone Encounter (Signed)
 Please clarify if she has been taking tylenol? Alcohol? Any other new medications?  If not--We may need to reduce arava to 10 mg daily and recheck AST and ALT in 2-3 weeks.

## 2023-06-11 NOTE — Telephone Encounter (Signed)
 Recommend reducing the dose of Arava to 10 mg daily and we can recheck AST and ALT in 2 to 3 weeks  If LFTs remain elevated she will need to discuss treatment alternatives with pain management

## 2023-06-14 ENCOUNTER — Other Ambulatory Visit (HOSPITAL_COMMUNITY): Payer: Self-pay | Admitting: Gastroenterology

## 2023-06-14 ENCOUNTER — Encounter (HOSPITAL_COMMUNITY): Payer: Self-pay

## 2023-06-14 ENCOUNTER — Ambulatory Visit (HOSPITAL_COMMUNITY)
Admission: RE | Admit: 2023-06-14 | Discharge: 2023-06-14 | Disposition: A | Discharge status: Home / Self Care | Source: Ambulatory Visit | Attending: Gastroenterology | Admitting: Gastroenterology

## 2023-06-14 DIAGNOSIS — R1011 Right upper quadrant pain: Secondary | ICD-10-CM

## 2023-06-14 DIAGNOSIS — K449 Diaphragmatic hernia without obstruction or gangrene: Secondary | ICD-10-CM | POA: Diagnosis not present

## 2023-06-14 DIAGNOSIS — R7989 Other specified abnormal findings of blood chemistry: Secondary | ICD-10-CM | POA: Diagnosis not present

## 2023-06-14 DIAGNOSIS — R1010 Upper abdominal pain, unspecified: Secondary | ICD-10-CM | POA: Insufficient documentation

## 2023-06-14 DIAGNOSIS — I700 Atherosclerosis of aorta: Secondary | ICD-10-CM | POA: Diagnosis not present

## 2023-06-14 DIAGNOSIS — R7401 Elevation of levels of liver transaminase levels: Secondary | ICD-10-CM | POA: Diagnosis not present

## 2023-06-14 MED ORDER — GADOBUTROL 1 MMOL/ML IV SOLN
5.3000 mL | Freq: Once | INTRAVENOUS | Status: AC | PRN
  Administered 2023-06-14: 5.3 mL via INTRAVENOUS

## 2023-06-17 DIAGNOSIS — E119 Type 2 diabetes mellitus without complications: Secondary | ICD-10-CM | POA: Diagnosis not present

## 2023-06-18 ENCOUNTER — Other Ambulatory Visit: Payer: Self-pay | Admitting: Neurosurgery

## 2023-06-25 ENCOUNTER — Telehealth: Payer: Self-pay | Admitting: *Deleted

## 2023-06-25 NOTE — Telephone Encounter (Signed)
 I returned patient's call.  Patient states she does not have any joint swelling.  She is just concerned about her fingers turning.  I advised her to get a finger splint.  Joint protection was discussed.  Patient voiced understanding.

## 2023-06-25 NOTE — Telephone Encounter (Signed)
 Patient has been taking Arava every other day because she cannot cut the 20 mg in half to take 10 mg daily as prescribed, patient has been on antibiotics since 06/18/2023 so she discontinued taking Arava, patient states her right hand 2nd digit joint has turned, patient states her pain is much worse, [address], [address]

## 2023-06-26 DIAGNOSIS — I10 Essential (primary) hypertension: Secondary | ICD-10-CM | POA: Diagnosis not present

## 2023-06-26 DIAGNOSIS — R002 Palpitations: Secondary | ICD-10-CM | POA: Diagnosis not present

## 2023-06-26 DIAGNOSIS — L4052 Psoriatic arthritis mutilans: Secondary | ICD-10-CM | POA: Diagnosis not present

## 2023-06-28 DIAGNOSIS — Z789 Other specified health status: Secondary | ICD-10-CM | POA: Diagnosis not present

## 2023-06-28 DIAGNOSIS — M810 Age-related osteoporosis without current pathological fracture: Secondary | ICD-10-CM | POA: Diagnosis not present

## 2023-06-28 DIAGNOSIS — I10 Essential (primary) hypertension: Secondary | ICD-10-CM | POA: Diagnosis not present

## 2023-06-28 DIAGNOSIS — E7800 Pure hypercholesterolemia, unspecified: Secondary | ICD-10-CM | POA: Diagnosis not present

## 2023-06-28 DIAGNOSIS — E1129 Type 2 diabetes mellitus with other diabetic kidney complication: Secondary | ICD-10-CM | POA: Diagnosis not present

## 2023-06-28 DIAGNOSIS — E119 Type 2 diabetes mellitus without complications: Secondary | ICD-10-CM | POA: Diagnosis not present

## 2023-07-01 ENCOUNTER — Other Ambulatory Visit: Payer: Self-pay | Admitting: Neurosurgery

## 2023-07-01 ENCOUNTER — Other Ambulatory Visit: Payer: Self-pay | Admitting: *Deleted

## 2023-07-01 DIAGNOSIS — Z79899 Other long term (current) drug therapy: Secondary | ICD-10-CM

## 2023-07-01 NOTE — Addendum Note (Signed)
 Addended by: Henriette Combs on: 07/01/2023 09:38 AM   Modules accepted: Orders

## 2023-07-02 ENCOUNTER — Other Ambulatory Visit: Payer: Self-pay | Admitting: *Deleted

## 2023-07-02 ENCOUNTER — Other Ambulatory Visit: Payer: Self-pay | Admitting: Physician Assistant

## 2023-07-02 DIAGNOSIS — Z79899 Other long term (current) drug therapy: Secondary | ICD-10-CM

## 2023-07-02 DIAGNOSIS — R7989 Other specified abnormal findings of blood chemistry: Secondary | ICD-10-CM

## 2023-07-02 LAB — AST: AST: 34 IU/L (ref 0–40)

## 2023-07-02 LAB — ALT: ALT: 51 IU/L — ABNORMAL HIGH (ref 0–32)

## 2023-07-02 MED ORDER — LEFLUNOMIDE 10 MG PO TABS: 10.0000 mg | ORAL_TABLET | Freq: Every day | ORAL | 2 refills | Status: DC

## 2023-07-02 NOTE — Telephone Encounter (Signed)
 Last Fill: 04/01/2023  Next Visit: 10/30/2023  Last Visit: 05/15/2023  Dx: not mentioned  Current Dose per office note on 05/15/2023: not mentioned  Okay to refill Topamax?

## 2023-07-02 NOTE — Progress Notes (Signed)
 AST has returned to WNL.  ALT remain slightly elevated-51.   Improved since reducing the dose of arava to 10 mg daily.   Recommend continuing to follow lab work closely.  Repeat AST and ALT in 2-3 weeks.  Avoid tylenol use.

## 2023-07-02 NOTE — Telephone Encounter (Signed)
-----   Message from Gearldine Bienenstock sent at 07/02/2023  1:46 PM EDT ----- AST has returned to [facility].  ALT remain slightly elevated-51.   Improved since reducing the dose of arava to 10 mg daily.   Recommend continuing to follow lab work closely.  Repeat AST and ALT in 2-3 weeks.  Avoid tylenol use.

## 2023-07-05 NOTE — Pre-Procedure Instructions (Signed)
 Surgical Instructions   Your procedure is scheduled on Thursday, April 17th. Report to [facility] Main Entrance "A" at 10:00 A.M., then check in with the Admitting office. Any questions or running late day of surgery: call [phone]  Questions prior to your surgery date: call [phone], Monday-Friday, 8am-4pm. If you experience any cold or flu symptoms such as cough, fever, chills, shortness of breath, etc. between now and your scheduled surgery, please notify us at the above number.     Remember:  Do not eat or drink after midnight the night before your surgery    Take these medicines the morning of surgery with A SIP OF WATER  Apremilast (OTEZLA)  gabapentin (NEURONTIN)  metoprolol succinate (TOPROL-XL)  pantoprazole (PROTONIX)  predniSONE (DELTASONE)    May take these medicines IF NEEDED: albuterol (PROVENTIL HFA;VENTOLIN HFA)  cetirizine (ZYRTEC)  HYDROcodone-acetaminophen (NORCO)  Polyethyl Glycol-Propyl Glycol (SYSTANE OP) eye drops   Follow prescriber's instructions regarding leflunomide (ARAVA).   Stop taking Aspirin 5 days prior to surgery. Last dose 4/11.   One week prior to surgery, STOP taking any Aspirin (unless otherwise instructed by your surgeon) Aleve, Naproxen, Ibuprofen, Motrin, Advil, Goody's, BC's, all herbal medications, fish oil, and non-prescription vitamins.  WHAT DO I DO ABOUT MY DIABETES MEDICATION?   STOP taking Jardiance 3 days prior to surgery. Last dose 4/13.    HOW TO MANAGE YOUR DIABETES BEFORE AND AFTER SURGERY  Why is it important to control my blood sugar before and after surgery? Improving blood sugar levels before and after surgery helps healing and can limit problems. A way of improving blood sugar control is eating a healthy diet by:  Eating less sugar and carbohydrates  Increasing activity/exercise  Talking with your doctor about reaching your blood sugar goals High blood sugars (greater than 180 mg/dL) can raise  your risk of infections and slow your recovery, so you will need to focus on controlling your diabetes during the weeks before surgery. Make sure that the doctor who takes care of your diabetes knows about your planned surgery including the date and location.  How do I manage my blood sugar before surgery? Check your blood sugar at least 4 times a day, starting 2 days before surgery, to make sure that the level is not too high or low.  Check your blood sugar the morning of your surgery when you wake up and every 2 hours until you get to the Short Stay unit.  If your blood sugar is less than 70 mg/dL, you will need to treat for low blood sugar: Do not take insulin. Treat a low blood sugar (less than 70 mg/dL) with  cup of clear juice (cranberry or apple), 4 glucose tablets, OR glucose gel. Recheck blood sugar in 15 minutes after treatment (to make sure it is greater than 70 mg/dL). If your blood sugar is not greater than 70 mg/dL on recheck, call [phone] for further instructions. Report your blood sugar to the short stay nurse when you get to Short Stay.  If you are admitted to the hospital after surgery: Your blood sugar will be checked by the staff and you will probably be given insulin after surgery (instead of oral diabetes medicines) to make sure you have good blood sugar levels. The goal for blood sugar control after surgery is 80-180 mg/dL.                     Do NOT Smoke (Tobacco/Vaping) for 24 hours prior to your  procedure.  If you use a CPAP at night, you may bring your mask/headgear for your overnight stay.   You will be asked to remove any contacts, glasses, piercing's, hearing aid's, dentures/partials prior to surgery. Please bring cases for these items if needed.    Patients discharged the day of surgery will not be allowed to drive home, and someone needs to stay with them for 24 hours.  SURGICAL WAITING ROOM VISITATION Patients may have no more than 2 support people  in the waiting area - these visitors may rotate.   Pre-op nurse will coordinate an appropriate time for 1 ADULT support person, who may not rotate, to accompany patient in pre-op.  Children under the age of 1 must have an adult with them who is not the patient and must remain in the main waiting area with an adult.  If the patient needs to stay at the hospital during part of their recovery, the visitor guidelines for inpatient rooms apply.  Please refer to the [facility] website for the visitor guidelines for any additional information.   If you received a COVID test during your pre-op visit  it is requested that you wear a mask when out in public, stay away from anyone that may not be feeling well and notify your surgeon if you develop symptoms. If you have been in contact with anyone that has tested positive in the last 10 days please notify you surgeon.      Pre-operative 5 CHG Bathing Instructions   You can play a key role in reducing the risk of infection after surgery. Your skin needs to be as free of germs as possible. You can reduce the number of germs on your skin by washing with CHG (chlorhexidine gluconate) soap before surgery. CHG is an antiseptic soap that kills germs and continues to kill germs even after washing.   DO NOT use if you have an allergy to chlorhexidine/CHG or antibacterial soaps. If your skin becomes reddened or irritated, stop using the CHG and notify one of our RNs at [phone].   Please shower with the CHG soap starting 4 days before surgery using the following schedule:     Please keep in mind the following:  DO NOT shave, including legs and underarms, starting the day of your first shower.   You may shave your face at any point before/day of surgery.  Place clean sheets on your bed the day you start using CHG soap. Use a clean washcloth (not used since being washed) for each shower. DO NOT sleep with pets once you start using the CHG.   CHG Shower  Instructions:  Wash your face and private area with normal soap. If you choose to wash your hair, wash first with your normal shampoo.  After you use shampoo/soap, rinse your hair and body thoroughly to remove shampoo/soap residue.  Turn the water OFF and apply about 3 tablespoons (45 ml) of CHG soap to a CLEAN washcloth.  Apply CHG soap ONLY FROM YOUR NECK DOWN TO YOUR TOES (washing for 3-5 minutes)  DO NOT use CHG soap on face, private areas, open wounds, or sores.  Pay special attention to the area where your surgery is being performed.  If you are having back surgery, having someone wash your back for you may be helpful. Wait 2 minutes after CHG soap is applied, then you may rinse off the CHG soap.  Pat dry with a clean towel  Put on clean clothes/pajamas   If  you choose to wear lotion, please use ONLY the CHG-compatible lotions that are listed below.  Additional instructions for the day of surgery: DO NOT APPLY any lotions, deodorants, cologne, or perfumes.   Do not bring valuables to the hospital. [facility] is not responsible for any belongings/valuables. Do not wear nail polish, gel polish, artificial nails, or any other type of covering on natural nails (fingers and toes) Do not wear jewelry or makeup Put on clean/comfortable clothes.  Please brush your teeth.  Ask your nurse before applying any prescription medications to the skin.     CHG Compatible Lotions   Aveeno Moisturizing lotion  Cetaphil Moisturizing Cream  Cetaphil Moisturizing Lotion  Clairol Herbal Essence Moisturizing Lotion, Dry Skin  Clairol Herbal Essence Moisturizing Lotion, Extra Dry Skin  Clairol Herbal Essence Moisturizing Lotion, Normal Skin  Curel Age Defying Therapeutic Moisturizing Lotion with Alpha Hydroxy  Curel Extreme Care Body Lotion  Curel Soothing Hands Moisturizing Hand Lotion  Curel Therapeutic Moisturizing Cream, Fragrance-Free  Curel Therapeutic Moisturizing Lotion, Fragrance-Free   Curel Therapeutic Moisturizing Lotion, Original Formula  Eucerin Daily Replenishing Lotion  Eucerin Dry Skin Therapy Plus Alpha Hydroxy Crme  Eucerin Dry Skin Therapy Plus Alpha Hydroxy Lotion  Eucerin Original Crme  Eucerin Original Lotion  Eucerin Plus Crme Eucerin Plus Lotion  Eucerin TriLipid Replenishing Lotion  Keri Anti-Bacterial Hand Lotion  Keri Deep Conditioning Original Lotion Dry Skin Formula Softly Scented  Keri Deep Conditioning Original Lotion, Fragrance Free Sensitive Skin Formula  Keri Lotion Fast Absorbing Fragrance Free Sensitive Skin Formula  Keri Lotion Fast Absorbing Softly Scented Dry Skin Formula  Keri Original Lotion  Keri Skin Renewal Lotion Keri Silky Smooth Lotion  Keri Silky Smooth Sensitive Skin Lotion  Nivea Body Creamy Conditioning Oil  Nivea Body Extra Enriched Lotion  Nivea Body Original Lotion  Nivea Body Sheer Moisturizing Lotion Nivea Crme  Nivea Skin Firming Lotion  NutraDerm 30 Skin Lotion  NutraDerm Skin Lotion  NutraDerm Therapeutic Skin Cream  NutraDerm Therapeutic Skin Lotion  ProShield Protective Hand Cream  Provon moisturizing lotion  Please read over the following fact sheets that you were given.

## 2023-07-08 ENCOUNTER — Encounter (HOSPITAL_COMMUNITY): Payer: Self-pay

## 2023-07-08 ENCOUNTER — Other Ambulatory Visit: Payer: Self-pay

## 2023-07-08 ENCOUNTER — Encounter (HOSPITAL_COMMUNITY)
Admission: RE | Admit: 2023-07-08 | Discharge: 2023-07-08 | Disposition: A | Discharge status: Home / Self Care | Source: Ambulatory Visit | Attending: Neurosurgery | Admitting: Neurosurgery

## 2023-07-08 VITALS — BP 135/61 | HR 73 | Temp 98.0°F | Resp 16 | Ht <= 58 in | Wt 119.9 lb

## 2023-07-08 DIAGNOSIS — E785 Hyperlipidemia, unspecified: Secondary | ICD-10-CM | POA: Insufficient documentation

## 2023-07-08 DIAGNOSIS — Z981 Arthrodesis status: Secondary | ICD-10-CM | POA: Insufficient documentation

## 2023-07-08 DIAGNOSIS — K52832 Lymphocytic colitis: Secondary | ICD-10-CM | POA: Insufficient documentation

## 2023-07-08 DIAGNOSIS — L4050 Arthropathic psoriasis, unspecified: Secondary | ICD-10-CM | POA: Diagnosis not present

## 2023-07-08 DIAGNOSIS — K449 Diaphragmatic hernia without obstruction or gangrene: Secondary | ICD-10-CM | POA: Diagnosis not present

## 2023-07-08 DIAGNOSIS — R748 Abnormal levels of other serum enzymes: Secondary | ICD-10-CM | POA: Insufficient documentation

## 2023-07-08 DIAGNOSIS — E119 Type 2 diabetes mellitus without complications: Secondary | ICD-10-CM | POA: Diagnosis not present

## 2023-07-08 DIAGNOSIS — Z01812 Encounter for preprocedural laboratory examination: Secondary | ICD-10-CM | POA: Diagnosis not present

## 2023-07-08 DIAGNOSIS — Z01818 Encounter for other preprocedural examination: Secondary | ICD-10-CM

## 2023-07-08 DIAGNOSIS — M4804 Spinal stenosis, thoracic region: Secondary | ICD-10-CM | POA: Diagnosis not present

## 2023-07-08 DIAGNOSIS — R600 Localized edema: Secondary | ICD-10-CM | POA: Diagnosis not present

## 2023-07-08 DIAGNOSIS — K219 Gastro-esophageal reflux disease without esophagitis: Secondary | ICD-10-CM | POA: Diagnosis not present

## 2023-07-08 DIAGNOSIS — I7300 Raynaud's syndrome without gangrene: Secondary | ICD-10-CM | POA: Insufficient documentation

## 2023-07-08 DIAGNOSIS — J45909 Unspecified asthma, uncomplicated: Secondary | ICD-10-CM | POA: Diagnosis not present

## 2023-07-08 HISTORY — DX: Personal history of other diseases of the digestive system: Z87.19

## 2023-07-08 HISTORY — DX: Abnormal levels of other serum enzymes: R74.8

## 2023-07-08 LAB — COMPREHENSIVE METABOLIC PANEL WITH GFR
ALT: 41 U/L (ref 0–44)
AST: 31 U/L (ref 15–41)
Albumin: 3.6 g/dL (ref 3.5–5.0)
Alkaline Phosphatase: 42 U/L (ref 38–126)
Anion gap: 9 (ref 5–15)
BUN: 38 mg/dL — ABNORMAL HIGH (ref 8–23)
CO2: 21 mmol/L — ABNORMAL LOW (ref 22–32)
Calcium: 9.7 mg/dL (ref 8.9–10.3)
Chloride: 106 mmol/L (ref 98–111)
Creatinine, Ser: 1.02 mg/dL — ABNORMAL HIGH (ref 0.44–1.00)
GFR, Estimated: 58 mL/min — ABNORMAL LOW (ref >60)
Glucose, Bld: 139 mg/dL — ABNORMAL HIGH (ref 70–99)
Potassium: 4.5 mmol/L (ref 3.5–5.1)
Sodium: 136 mmol/L (ref 135–145)
Total Bilirubin: 0.6 mg/dL (ref 0.0–1.2)
Total Protein: 6.4 g/dL — ABNORMAL LOW (ref 6.5–8.1)

## 2023-07-08 LAB — HEMOGLOBIN A1C
Hgb A1c MFr Bld: 5.8 % — ABNORMAL HIGH (ref 4.8–5.6)
Mean Plasma Glucose: 119.76 mg/dL

## 2023-07-08 LAB — CBC
HCT: 37.7 % (ref 36.0–46.0)
Hemoglobin: 12.2 g/dL (ref 12.0–15.0)
MCH: 31.4 pg (ref 26.0–34.0)
MCHC: 32.4 g/dL (ref 30.0–36.0)
MCV: 97.2 fL (ref 80.0–100.0)
Platelets: 244 10*3/uL (ref 150–400)
RBC: 3.88 MIL/uL (ref 3.87–5.11)
RDW: 13.6 % (ref 11.5–15.5)
WBC: 12.3 10*3/uL — ABNORMAL HIGH (ref 4.0–10.5)
nRBC: 0 % (ref 0.0–0.2)

## 2023-07-08 LAB — GLUCOSE, CAPILLARY: Glucose-Capillary: 98 mg/dL (ref 70–99)

## 2023-07-08 LAB — SURGICAL PCR SCREEN
MRSA, PCR: NEGATIVE
Staphylococcus aureus: POSITIVE — AB

## 2023-07-08 NOTE — Progress Notes (Signed)
 PCP - Dr. Carylon Perches  Cardiologist - Pt saw Dr. Rennis Golden 10/31/22 due to high cholesterol. Repatha was too expensive for pt. She had surgery on her eyelids, which resolved the issue with the swelling in that area. She stated she did not need to f/u with Dr. Rennis Golden  Endocrinologist- Dennie Maizes, FNP  PPM/ICD - denies   Chest x-ray - 08/31/21 EKG - 11/07/22 Stress Test - denies ECHO - denies Cardiac Cath - denies  Sleep Study - denies   Fasting Blood Sugar - 80-110 Checks Blood Sugar 3-4 times/week  Last dose of GLP1 agonist-  n/a   Blood Thinner Instructions: n/a Aspirin Instructions: Hold 5 days. Last dose 4/11  ERAS Protcol - no, NPO   COVID TEST- n/a   Anesthesia review: yes, pt saw Dr. Rennis Golden 10/31/22 r/t high cholesterol.  Patient denies shortness of breath, fever, cough and chest pain at PAT appointment   All instructions explained to the patient, with a verbal understanding of the material. Patient agrees to go over the instructions while at home for a better understanding.  The opportunity to ask questions was provided.

## 2023-07-09 ENCOUNTER — Telehealth: Payer: Self-pay | Admitting: *Deleted

## 2023-07-09 NOTE — Telephone Encounter (Signed)
 Patient contacted the office and left message requesting a call back. Patient advised she is having back surgery on 07/18/2023. Patient would like to know what she needs to do about holing her Ranae Plumber and Altamease Oiler which is due 07/23/2023. Patient was advised advised by surgeon that she can take her [place].    Altamease Oiler will need to be held for 4 weeks prior to surgery and Ranae Plumber will need to be held one week prior to surgery. Both may be resumed 2 weeks after surgery as long she has been cleared by the surgeon to restart.  Attempted to contact the patient and left message for patient to call the office.

## 2023-07-09 NOTE — Progress Notes (Signed)
 Anesthesia Chart Review:  Case: [id number] Date/Time: 07/18/23 0715   Procedure: THORACIC DISCECTOMY - T11-T12 LAMINECTOMY   Anesthesia type: General   Diagnosis: Degenerative thoracic spinal stenosis [M48.04]   Pre-op diagnosis: DEGENERATIVE THORACIC SPINAL STENOSIS   [facility] / [facility]   Surgeons: Tressie Stalker, MD       DISCUSSION: Patient is a 73 year old female scheduled for the above procedure.  History includes never smoker, psoriatic arthritis, hiatal hernia, GERD, HTN, DM2, asthma, HLD, peripheral edema, lymphocytic colitis, Raynaud's disease, spinal surgery (L4-5 laminectomy/discectomy 08/29/01; L2-3 PSF 02/05/07; L1-2 PSF 02/18/13; C5-6 ACDF 03/09/20; L4-5 PSF 08/03/20), cholecystectomy (04/15/23).  She was being followed by cardiologist Dr. Rennis Golden in the [facility] from 04/21/19 - 10/31/22. In March 2023, she had a coronary calcium score of  140 (LAD). This was 75th percentile for age. She was on Repatha, but had run out and was having to apply for medical assistance due to high cost. He had increased her fish oil. Because she had not taking Repatha, he did not recheck lipid panel at her last visit on 10/31/22. Six month lipid follow-up had been planned. She is currently on fish oil-omega 3 1 gm BID, but not on Repatha. She has statin intolerance. She is now followed by primary care.  PCP Carylon Perches, MD cleared her for surgery at "Moderate Risk". She denied chest pain and SOB at PAT RN visit.   DM is well controlled with A1c 5.8%. Last Jardiance planned for 07/14/23.   Rheumatology has advised that Arava be held 4 weeks prior to surgery and 1 week after surgery.  AST 31 and ALT 41 on 07/08/2023. She is also on prednisone 5 mg daily, Otezla 30 mg BID, Taltz 80 mg Q 28 days..   She reported last aspirin and for 07/12/2023.    Reviewed above with anesthesiologist Fittzgerald, Robert, MD. She denied CV symptoms and tolerated cholecystectomy a few months ago. Her PCP medically  cleared her for surgery. Anesthesia team to evaluate on the day of surgery. Hiatal hernia is classified as large on recent imaging. She is on prednisone 5 mg daily.    VS: BP 135/61   Pulse 73   Temp 36.7 C   Resp 16   Ht 4\' 10"  (1.473 m)   Wt 54.4 kg   LMP 02/01/1999   SpO2 100%   BMI 25.06 kg/m    PROVIDERS: Carylon Perches, MD is PCP  Dennie Maizes, FNP is the chronology provider [facility]) Rennis Golden, Lisette Abu, MD is cardiologist Earnest Bailey, MD is GI Pollyann Savoy, MD is rheumatologist   LABS: Labs reviewed: Acceptable for surgery. (all labs ordered are listed, but only abnormal results are displayed)  Labs Reviewed  SURGICAL PCR SCREEN - Abnormal; Notable for the following components:      Result Value   Staphylococcus aureus POSITIVE (*)    All other components within normal limits  HEMOGLOBIN A1C - Abnormal; Notable for the following components:   Hgb A1c MFr Bld 5.8 (*)    All other components within normal limits  COMPREHENSIVE METABOLIC PANEL WITH GFR - Abnormal; Notable for the following components:   CO2 21 (*)    Glucose, Bld 139 (*)    BUN 38 (*)    Creatinine, Ser 1.02 (*)    Total Protein 6.4 (*)    GFR, Estimated 58 (*)    All other components within normal limits  CBC - Abnormal; Notable for the following components:  WBC 12.3 (*)    All other components within normal limits  GLUCOSE, CAPILLARY    IMAGES: MRI Abd 06/14/23: IMPRESSION: 1. Status post cholecystectomy. 2. No biliary dilatation. No calculi or other obstruction identified. 3. Small hiatal hernia. Aortic Atherosclerosis (ICD10-I70.0).  MRI T-spine 05/02/23:  IMPRESSION: 1. Lower lumbar spine predominant degenerative disc disease with moderate spinal canal stenosis at T10-T11 and mild spinal canal narrowing at T8-T9 and T9-T10. The left paracentral disc extrusion at T8-T9 and the right paracentral disc extrusion at T9-T10 are new from 09/19/21 2.  Moderate to severe bilateral neural foraminal stenosis at T10-T11 and moderate left-sided narrowing at T8-T9 and T9-T10. Findings are unchanged from 09/19/21. 3. Large hiatal hernia with a patulous esophagus.    EKG: 11/07/22: Sinus tachycardia at 102 bpm Otherwise normal ECG When compared with ECG of 21-Dec-2020 15:14, Rate slower PREVIOUS ECG IS PRESENT Confirmed by Carylon Perches [phone]) on 11/08/2022 6:48:31 AM   CV: CT Cardiac Calcium Scoring 06/02/21: FINDINGS: Coronary arteries: Normal origins. Coronary Calcium Score: Left main: 0 Left anterior descending artery: 140 Left circumflex artery: 0 Right coronary artery: 0 Total: 140 Percentile: 75th Pericardium: Normal. Ascending Aorta: Normal caliber.   IMPRESSION: Coronary calcium score of 140. This was 75th percentile for age-, race-, and sex-matched controls. RECOMMENDATIONS:... If CAC is >=100 or >=75th percentile, it is reasonable to initiate statin therapy at any age.    Past Medical History:  Diagnosis Date   Asthma    Albuterol in haler prn   Cataract    immature unsure which eye   Chronic back pain    COVID-19 October/November 2020   Degenerative disk disease    psoriatic   Diabetes mellitus without complication (HCC)    diet controlled   Elevated liver enzymes    r/t leflunamide (this medication is put on hold intermittently due to this)   Esophageal motility disorder    Non-specific, see modified barium study/speech path, BP   GERD (gastroesophageal reflux disease)    takes Protonix bid   History of blood transfusion    post c-section   History of bronchitis    History of hiatal hernia    HTN (hypertension)    Hyperlipidemia    Lymphocytic colitis 05/26/2010   Responded to Entocort x 3 MOS   Neuropathy    Nonallergic rhinitis    Osteoporosis    gets Prolia every 6 months   Peripheral edema    Pneumonia 02/2019   Psoriatic arthritis (HCC)    Dr. Anthonette Legato   Psoriatic arthritis [facility])    bilateral  hands   Raynaud's disease    Seasonal allergies    Shingles 2020   SUI (stress urinary incontinence, female)    Urinary urgency     Past Surgical History:  Procedure Laterality Date   ANTERIOR CERVICAL DECOMP/DISCECTOMY FUSION N/A 03/09/2020   Procedure: ANTERIOR CERVICAL DECOMPRESSION/DISCECTOMY FUSION, INTERBODY PROSTHESIS, PLATE SCREWS CERVICAL FIVE-SIX, CERVICAL SIX-SEVEN;  Surgeon: Tressie Stalker, MD;  Location: [facility] OR;  Service: Neurosurgery;  Laterality: N/A;   BACK SURGERY  2003   BALLOON DILATION N/A 11/12/2022   Procedure: BALLOON DILATION;  Surgeon: Lanelle Bal, DO;  Location[facility] SU[facility];  Service: Endoscopy;  Laterality: N/A;   BIOPSY  04/17/2016   Procedure: BIOPSY;  Surgeon: West Bali, MD;  Location: [facility];  Service: Endoscopy;;  random colon bx's   BIOPSY  11/12/2022   Procedure: BIOPSY;  Surgeon: Lanelle Bal, DO;  [facility];  Service:  Endoscopy;;   CERVICAL SPINE SURGERY  09/09/2017   CESAREAN SECTION  1974, 1978       CHOLECYSTECTOMY  04/2023   COLONOSCOPY  06/19/2008   [id number] internal hemorrhoids/mild sigmoin colon diverticulosis   COLONOSCOPY  2012   Dr. Jena Gauss: normal rectum, diverticula, lymphocytic colitis    COLONOSCOPY WITH PROPOFOL N/A 04/17/2016   Dr. Darrick Penna: Normal terminal ileum, internal/external hemorrhoids, random colon biopsies consistent with collagenous colitis   DILATION AND CURETTAGE OF UTERUS  1977   spontaneous abortion   ESOPHAGOGASTRODUODENOSCOPY  06/19/2008   [id number] gastritis   ESOPHAGOGASTRODUODENOSCOPY (EGD) WITH PROPOFOL N/A 11/12/2022   Procedure: ESOPHAGOGASTRODUODENOSCOPY (EGD) WITH PROPOFOL;  Surgeon: Lanelle Bal, DO;  [facility];  Service: Endoscopy;  Laterality: N/A;  8:30 am, asa 3   LUMBAR DISC ARTHROPLASTY  09/1996   LUMBAR FUSION  6/03, 11/08, 11/14   L4-5 fusion, L2-3 fusion, L1-2   LUMBAR FUSION  08/03/2020   L4-L5   LUMBAR LAMINECTOMY  03/2020   MOUTH  SURGERY  12/07/2021   oral surgery   ODONTOID SCREW INSERTION N/A 01/27/2018   Procedure: ODONTOID SCREW INSERTION;  Surgeon: Tressie Stalker, MD;  Location[facility] OR;  Service: Neurosurgery;  Laterality: N/A;  ODONTOID SCREW INSERTION   TONSILLECTOMY     TONSILLECTOMY AND ADENOIDECTOMY     TUBAL LIGATION      MEDICATIONS:  albuterol (PROVENTIL HFA;VENTOLIN HFA) 108 (90 BASE) MCG/ACT inhaler   Apremilast (OTEZLA) 30 MG TABS   aspirin 81 MG tablet   Blood Glucose Monitoring Suppl (ONETOUCH VERIO FLEX SYSTEM) w/Device KIT   cetirizine (ZYRTEC) 10 MG tablet   chlorhexidine (PERIDEX) 0.12 % solution   cholecalciferol (VITAMIN D3) 25 MCG (1000 UNIT) tablet   empagliflozin (JARDIANCE) 25 MG TABS tablet   fish oil-omega-3 fatty acids 1000 MG capsule   gabapentin (NEURONTIN) 600 MG tablet   Ginger, Zingiber officinalis, (GINGER ROOT) 500 MG CAPS   Glucosamine Sulfate 1000 MG TABS   HYDROcodone-acetaminophen (NORCO) 7.5-325 MG tablet   hydrocortisone sodium succinate (SOLU-CORTEF) 100 MG SOLR injection   ixekizumab (TALTZ) 80 MG/ML pen   Lancets (ONETOUCH DELICA PLUS LANCET33G) MISC   leflunomide (ARAVA) 10 MG tablet   metoprolol succinate (TOPROL-XL) 50 MG 24 hr tablet   Misc Natural Products (TART CHERRY ADVANCED PO)   Multiple Vitamin (MULTIVITAMIN) tablet   nortriptyline (PAMELOR) 25 MG capsule   olmesartan (BENICAR) 40 MG tablet   ONETOUCH VERIO test strip   oxybutynin (DITROPAN-XL) 5 MG 24 hr tablet   pantoprazole (PROTONIX) 40 MG tablet   Polyethyl Glycol-Propyl Glycol (SYSTANE OP)   potassium chloride (KLOR-CON) 20 MEQ packet   predniSONE (DELTASONE) 5 MG tablet   spironolactone (ALDACTONE) 25 MG tablet   topiramate (TOPAMAX) 200 MG tablet   Turmeric 500 MG CAPS   No current facility-administered medications for this encounter.    Shonna Chock, PA-C Surgical Short Stay/Anesthesiology [facility] Phone [phone] [facility] Phone [phone] 07/10/2023 11:56 AM

## 2023-07-09 NOTE — Telephone Encounter (Signed)
 Patient Tonya Hall will need to be held for 4 weeks prior to surgery and Arava will need to be held one week prior to surgery. Both may be resumed 2 weeks after surgery as long she has been cleared by the surgeon to restart. Patient states she had labs for her pre-op and her LFT's returned to normal. Patient wanted to know when she restarts her Ranae Plumber should she restart on 20 mg. Patient advised she will need to restart on 10 mg and remain on 10 mg. Patient verbalized understanding. Patient advised her next set of labs are due in July 2025.

## 2023-07-10 ENCOUNTER — Other Ambulatory Visit: Payer: Self-pay | Admitting: Physician Assistant

## 2023-07-10 ENCOUNTER — Other Ambulatory Visit: Payer: Self-pay | Admitting: Rheumatology

## 2023-07-10 DIAGNOSIS — L409 Psoriasis, unspecified: Secondary | ICD-10-CM

## 2023-07-10 NOTE — Anesthesia Preprocedure Evaluation (Addendum)
 Anesthesia Evaluation  Patient identified by MRN, date of birth, ID band Patient awake    Reviewed: Allergy & Precautions, H&P , NPO status , Patient's Chart, lab work & pertinent test results  Airway Mallampati: II  TM Distance: >3 FB Neck ROM: Full    Dental no notable dental hx. (+) Dental Advisory Given, Teeth Intact   Pulmonary neg pulmonary ROS, asthma    Pulmonary exam normal breath sounds clear to auscultation       Cardiovascular hypertension, Pt. on medications negative cardio ROS  Rhythm:Regular Rate:Normal     Neuro/Psych Raynaud's disease.  Neuromuscular disease negative neurological ROS  negative psych ROS   GI/Hepatic negative GI ROS, Neg liver ROS,GERD  ,,Esophageal motility disorder.  Lymphocytic colitis   Endo/Other  negative endocrine ROSdiabetes, Well Controlled, Type 2    Renal/GU Renal diseasenegative Renal ROS  negative genitourinary   Musculoskeletal negative musculoskeletal ROS (+) Arthritis , Osteoarthritis,    Abdominal Normal abdominal exam  (+)   Peds negative pediatric ROS (+)  Hematology negative hematology ROS (+)   Anesthesia Other Findings Psoriatic arthritis  Reproductive/Obstetrics negative OB ROS                             Anesthesia Physical Anesthesia Plan  ASA: 3  Anesthesia Plan: General   Post-op Pain Management: Dilaudid IV   Induction: Intravenous  PONV Risk Score and Plan: 3 and Ondansetron, Dexamethasone, Midazolam and Treatment may vary due to age or medical condition  Airway Management Planned: Oral ETT  Additional Equipment: None  Intra-op Plan:   Post-operative Plan: Extubation in OR  Informed Consent: I have reviewed the patients History and Physical, chart, labs and discussed the procedure including the risks, benefits and alternatives for the proposed anesthesia with the patient or authorized representative who has  indicated his/her understanding and acceptance.     Dental advisory given  Plan Discussed with: CRNA  Anesthesia Plan Comments: (PAT note written 07/10/2023 by Allison Zelenak, PA-C.  )       Anesthesia Quick Evaluation

## 2023-07-10 NOTE — Telephone Encounter (Signed)
 Last Fill: 04/10/2023  Next Visit: 10/30/2023  Last Visit: 05/15/2023  Dx: Psoriatic arthritis   Current Dose per office note on 05/15/2023: prednisone 5 mg 1 tablet daily   Okay to refill Prednisone?

## 2023-07-10 NOTE — Telephone Encounter (Signed)
 Last Fill: 04/10/2023  Labs: 07/08/2023 CO2 21, Glucose 139, BUN 38, Creat. 1.02, GFR 58, Total Protein 6.4, WBC 12.3  Next Visit: 10/30/2023  Last Visit: 05/15/2023  DX: Psoriatic arthritis   Current Dose per office note 05/15/2023: [place] 30 mg 1 tablet by mouth twice daily   Okay to refill [place]?

## 2023-07-15 ENCOUNTER — Other Ambulatory Visit: Payer: Self-pay | Admitting: Physical Medicine and Rehabilitation

## 2023-07-17 ENCOUNTER — Encounter: Payer: Self-pay | Admitting: Physical Medicine and Rehabilitation

## 2023-07-17 NOTE — Progress Notes (Signed)
 Pt made aware of surgery time change for 07/18/23 [id number], arrival 0530, and to follow all previous instructions given.

## 2023-07-18 ENCOUNTER — Ambulatory Visit (HOSPITAL_COMMUNITY)
Admission: RE | Admit: 2023-07-18 | Discharge: 2023-07-19 | Disposition: A | Discharge status: Home / Self Care | Attending: Neurosurgery | Admitting: Neurosurgery

## 2023-07-18 ENCOUNTER — Other Ambulatory Visit: Payer: Self-pay

## 2023-07-18 ENCOUNTER — Encounter (HOSPITAL_COMMUNITY): Payer: Self-pay | Admitting: Neurosurgery

## 2023-07-18 ENCOUNTER — Ambulatory Visit (HOSPITAL_COMMUNITY)

## 2023-07-18 ENCOUNTER — Ambulatory Visit (HOSPITAL_COMMUNITY): Payer: Self-pay | Admitting: Vascular Surgery

## 2023-07-18 ENCOUNTER — Encounter (HOSPITAL_COMMUNITY)
Admission: RE | Disposition: A | Discharge status: Home / Self Care | Payer: Self-pay | Source: Home / Self Care | Attending: Neurosurgery

## 2023-07-18 ENCOUNTER — Ambulatory Visit (HOSPITAL_BASED_OUTPATIENT_CLINIC_OR_DEPARTMENT_OTHER): Payer: Self-pay | Admitting: Anesthesiology

## 2023-07-18 DIAGNOSIS — I7300 Raynaud's syndrome without gangrene: Secondary | ICD-10-CM | POA: Diagnosis not present

## 2023-07-18 DIAGNOSIS — Z4789 Encounter for other orthopedic aftercare: Secondary | ICD-10-CM | POA: Diagnosis not present

## 2023-07-18 DIAGNOSIS — I10 Essential (primary) hypertension: Secondary | ICD-10-CM | POA: Diagnosis not present

## 2023-07-18 DIAGNOSIS — M5104 Intervertebral disc disorders with myelopathy, thoracic region: Secondary | ICD-10-CM | POA: Diagnosis not present

## 2023-07-18 DIAGNOSIS — K219 Gastro-esophageal reflux disease without esophagitis: Secondary | ICD-10-CM | POA: Diagnosis not present

## 2023-07-18 DIAGNOSIS — Z7984 Long term (current) use of oral hypoglycemic drugs: Secondary | ICD-10-CM | POA: Insufficient documentation

## 2023-07-18 DIAGNOSIS — N289 Disorder of kidney and ureter, unspecified: Secondary | ICD-10-CM | POA: Insufficient documentation

## 2023-07-18 DIAGNOSIS — J45909 Unspecified asthma, uncomplicated: Secondary | ICD-10-CM

## 2023-07-18 DIAGNOSIS — E119 Type 2 diabetes mellitus without complications: Secondary | ICD-10-CM | POA: Diagnosis not present

## 2023-07-18 DIAGNOSIS — M1990 Unspecified osteoarthritis, unspecified site: Secondary | ICD-10-CM | POA: Diagnosis not present

## 2023-07-18 DIAGNOSIS — Z79899 Other long term (current) drug therapy: Secondary | ICD-10-CM | POA: Insufficient documentation

## 2023-07-18 DIAGNOSIS — K52832 Lymphocytic colitis: Secondary | ICD-10-CM | POA: Diagnosis not present

## 2023-07-18 DIAGNOSIS — G992 Myelopathy in diseases classified elsewhere: Secondary | ICD-10-CM | POA: Diagnosis present

## 2023-07-18 DIAGNOSIS — G709 Myoneural disorder, unspecified: Secondary | ICD-10-CM | POA: Diagnosis not present

## 2023-07-18 DIAGNOSIS — M5114 Intervertebral disc disorders with radiculopathy, thoracic region: Secondary | ICD-10-CM | POA: Insufficient documentation

## 2023-07-18 DIAGNOSIS — M4804 Spinal stenosis, thoracic region: Secondary | ICD-10-CM | POA: Diagnosis not present

## 2023-07-18 DIAGNOSIS — Z981 Arthrodesis status: Secondary | ICD-10-CM | POA: Diagnosis not present

## 2023-07-18 HISTORY — PX: THORACIC DISCECTOMY: SHX6113

## 2023-07-18 LAB — GLUCOSE, CAPILLARY
Glucose-Capillary: 97 mg/dL (ref 70–99)
Glucose-Capillary: 161 mg/dL — ABNORMAL HIGH (ref 70–99)

## 2023-07-18 SURGERY — THORACIC DISCECTOMY
Anesthesia: General | Site: Spine Thoracic

## 2023-07-18 MED ORDER — CYCLOBENZAPRINE HCL 10 MG PO TABS
10.0000 mg | ORAL_TABLET | Freq: Three times a day (TID) | ORAL | Status: DC | PRN
  Administered 2023-07-18 – 2023-07-19 (×3): 10 mg via ORAL
  Filled 2023-07-18 (×3): qty 1

## 2023-07-18 MED ORDER — DOCUSATE SODIUM 100 MG PO CAPS
100.0000 mg | ORAL_CAPSULE | Freq: Two times a day (BID) | ORAL | Status: DC
  Administered 2023-07-18 – 2023-07-19 (×3): 100 mg via ORAL
  Filled 2023-07-18 (×3): qty 1

## 2023-07-18 MED ORDER — FENTANYL CITRATE (PF) 250 MCG/5ML IJ SOLN
INTRAMUSCULAR | Status: DC | PRN
  Administered 2023-07-18: 100 ug via INTRAVENOUS

## 2023-07-18 MED ORDER — OXYCODONE HCL 5 MG PO TABS
5.0000 mg | ORAL_TABLET | Freq: Once | ORAL | Status: AC | PRN
  Administered 2023-07-18: 5 mg via ORAL

## 2023-07-18 MED ORDER — PROPOFOL 10 MG/ML IV BOLUS
INTRAVENOUS | Status: AC
  Filled 2023-07-18: qty 20

## 2023-07-18 MED ORDER — OXYCODONE HCL 5 MG/5ML PO SOLN: 5.0000 mg | Freq: Once | ORAL | Status: AC | PRN

## 2023-07-18 MED ORDER — PHENYLEPHRINE HCL-NACL 20-0.9 MG/250ML-% IV SOLN
INTRAVENOUS | Status: DC | PRN
  Administered 2023-07-18: 30 ug/min via INTRAVENOUS

## 2023-07-18 MED ORDER — SODIUM CHLORIDE 0.9% FLUSH
3.0000 mL | Freq: Two times a day (BID) | INTRAVENOUS | Status: DC
  Administered 2023-07-18 – 2023-07-19 (×3): 3 mL via INTRAVENOUS

## 2023-07-18 MED ORDER — SODIUM CHLORIDE 0.9% FLUSH: 3.0000 mL | INTRAVENOUS | Status: DC | PRN

## 2023-07-18 MED ORDER — ONDANSETRON HCL 4 MG/2ML IJ SOLN
INTRAMUSCULAR | Status: DC | PRN
  Administered 2023-07-18: 4 mg via INTRAVENOUS

## 2023-07-18 MED ORDER — OXYCODONE HCL 5 MG PO TABS
ORAL_TABLET | ORAL | Status: AC
  Filled 2023-07-18: qty 1

## 2023-07-18 MED ORDER — BUPIVACAINE-EPINEPHRINE (PF) 0.5% -1:200000 IJ SOLN
INTRAMUSCULAR | Status: AC
  Filled 2023-07-18: qty 30

## 2023-07-18 MED ORDER — ORAL CARE MOUTH RINSE: 15.0000 mL | Freq: Once | OROMUCOSAL | Status: AC

## 2023-07-18 MED ORDER — EPHEDRINE 5 MG/ML INJ
INTRAVENOUS | Status: AC
  Filled 2023-07-18: qty 5

## 2023-07-18 MED ORDER — VANCOMYCIN HCL IN DEXTROSE 1-5 GM/200ML-% IV SOLN
1000.0000 mg | INTRAVENOUS | Status: AC
  Administered 2023-07-18: 1000 mg via INTRAVENOUS
  Filled 2023-07-18: qty 200

## 2023-07-18 MED ORDER — PHENYLEPHRINE 80 MCG/ML (10ML) SYRINGE FOR IV PUSH (FOR BLOOD PRESSURE SUPPORT)
PREFILLED_SYRINGE | INTRAVENOUS | Status: AC
  Filled 2023-07-18: qty 10

## 2023-07-18 MED ORDER — DEXAMETHASONE SODIUM PHOSPHATE 10 MG/ML IJ SOLN
INTRAMUSCULAR | Status: AC
  Filled 2023-07-18: qty 1

## 2023-07-18 MED ORDER — HYDROMORPHONE HCL 1 MG/ML IJ SOLN
0.5000 mg | INTRAMUSCULAR | Status: DC | PRN
  Administered 2023-07-19 (×3): 1 mg via INTRAVENOUS
  Filled 2023-07-18 (×3): qty 1

## 2023-07-18 MED ORDER — ONDANSETRON HCL 4 MG/2ML IJ SOLN
INTRAMUSCULAR | Status: AC
  Filled 2023-07-18: qty 2

## 2023-07-18 MED ORDER — BACITRACIN ZINC 500 UNIT/GM EX OINT
TOPICAL_OINTMENT | CUTANEOUS | Status: DC | PRN
  Administered 2023-07-18: 1 via TOPICAL

## 2023-07-18 MED ORDER — SODIUM CHLORIDE 0.9 % IV SOLN: INTRAVENOUS | Status: DC | PRN

## 2023-07-18 MED ORDER — ACETAMINOPHEN 325 MG PO TABS
650.0000 mg | ORAL_TABLET | ORAL | Status: DC | PRN
  Administered 2023-07-19: 650 mg via ORAL
  Filled 2023-07-18: qty 2

## 2023-07-18 MED ORDER — CHLORHEXIDINE GLUCONATE CLOTH 2 % EX PADS: 6.0000 | MEDICATED_PAD | Freq: Once | CUTANEOUS | Status: DC

## 2023-07-18 MED ORDER — LIDOCAINE 2% (20 MG/ML) 5 ML SYRINGE
INTRAMUSCULAR | Status: AC
  Filled 2023-07-18: qty 5

## 2023-07-18 MED ORDER — MIDAZOLAM HCL 2 MG/2ML IJ SOLN
INTRAMUSCULAR | Status: AC
  Filled 2023-07-18: qty 2

## 2023-07-18 MED ORDER — HYDROMORPHONE HCL 1 MG/ML IJ SOLN
INTRAMUSCULAR | Status: AC
  Filled 2023-07-18: qty 0.5

## 2023-07-18 MED ORDER — HYDROMORPHONE HCL 1 MG/ML IJ SOLN
INTRAMUSCULAR | Status: DC | PRN
  Administered 2023-07-18 (×2): .25 mg via INTRAVENOUS

## 2023-07-18 MED ORDER — ROCURONIUM BROMIDE 10 MG/ML (PF) SYRINGE
PREFILLED_SYRINGE | INTRAVENOUS | Status: AC
  Filled 2023-07-18: qty 10

## 2023-07-18 MED ORDER — ROCURONIUM BROMIDE 10 MG/ML (PF) SYRINGE
PREFILLED_SYRINGE | INTRAVENOUS | Status: DC | PRN
  Administered 2023-07-18: 50 mg via INTRAVENOUS
  Administered 2023-07-18 (×2): 10 mg via INTRAVENOUS

## 2023-07-18 MED ORDER — PHENYLEPHRINE 80 MCG/ML (10ML) SYRINGE FOR IV PUSH (FOR BLOOD PRESSURE SUPPORT)
PREFILLED_SYRINGE | INTRAVENOUS | Status: DC | PRN
  Administered 2023-07-18: 160 ug via INTRAVENOUS
  Administered 2023-07-18: 80 ug via INTRAVENOUS
  Administered 2023-07-18 (×2): 160 ug via INTRAVENOUS

## 2023-07-18 MED ORDER — BUPIVACAINE-EPINEPHRINE (PF) 0.5% -1:200000 IJ SOLN
INTRAMUSCULAR | Status: DC | PRN
  Administered 2023-07-18 (×2): 10 mL

## 2023-07-18 MED ORDER — ZOLPIDEM TARTRATE 5 MG PO TABS: 5.0000 mg | ORAL_TABLET | Freq: Every evening | ORAL | Status: DC | PRN

## 2023-07-18 MED ORDER — LIDOCAINE 2% (20 MG/ML) 5 ML SYRINGE
INTRAMUSCULAR | Status: DC | PRN
  Administered 2023-07-18: 60 mg via INTRAVENOUS

## 2023-07-18 MED ORDER — ACETAMINOPHEN 650 MG RE SUPP: 650.0000 mg | RECTAL | Status: DC | PRN

## 2023-07-18 MED ORDER — SUGAMMADEX SODIUM 200 MG/2ML IV SOLN
INTRAVENOUS | Status: DC | PRN
  Administered 2023-07-18: 200 mg via INTRAVENOUS

## 2023-07-18 MED ORDER — SODIUM CHLORIDE 0.9 % IV SOLN: 12.5000 mg | INTRAVENOUS | Status: DC | PRN

## 2023-07-18 MED ORDER — HYDROMORPHONE HCL 1 MG/ML IJ SOLN
INTRAMUSCULAR | Status: AC
  Filled 2023-07-18: qty 1

## 2023-07-18 MED ORDER — SODIUM CHLORIDE 0.9 % IV SOLN: 250.0000 mL | INTRAVENOUS | Status: AC

## 2023-07-18 MED ORDER — MENTHOL 3 MG MT LOZG: 1.0000 | LOZENGE | OROMUCOSAL | Status: DC | PRN

## 2023-07-18 MED ORDER — OXYCODONE HCL 5 MG PO TABS
10.0000 mg | ORAL_TABLET | ORAL | Status: DC | PRN
  Administered 2023-07-19 (×2): 10 mg via ORAL
  Filled 2023-07-18 (×2): qty 2

## 2023-07-18 MED ORDER — BISACODYL 10 MG RE SUPP: 10.0000 mg | Freq: Every day | RECTAL | Status: DC | PRN

## 2023-07-18 MED ORDER — FENTANYL CITRATE (PF) 250 MCG/5ML IJ SOLN
INTRAMUSCULAR | Status: AC
  Filled 2023-07-18: qty 5

## 2023-07-18 MED ORDER — AMISULPRIDE (ANTIEMETIC) 5 MG/2ML IV SOLN: 10.0000 mg | Freq: Once | INTRAVENOUS | Status: DC | PRN

## 2023-07-18 MED ORDER — ONDANSETRON HCL 4 MG/2ML IJ SOLN: 4.0000 mg | Freq: Four times a day (QID) | INTRAMUSCULAR | Status: DC | PRN

## 2023-07-18 MED ORDER — ONDANSETRON HCL 4 MG PO TABS: 4.0000 mg | ORAL_TABLET | Freq: Four times a day (QID) | ORAL | Status: DC | PRN

## 2023-07-18 MED ORDER — THROMBIN 5000 UNITS EX KIT
PACK | CUTANEOUS | Status: AC
  Filled 2023-07-18: qty 1

## 2023-07-18 MED ORDER — PROPOFOL 10 MG/ML IV BOLUS
INTRAVENOUS | Status: DC | PRN
  Administered 2023-07-18: 130 mg via INTRAVENOUS

## 2023-07-18 MED ORDER — HYDROMORPHONE HCL 1 MG/ML IJ SOLN
0.2500 mg | INTRAMUSCULAR | Status: DC | PRN
  Administered 2023-07-18 (×2): 0.25 mg via INTRAVENOUS
  Administered 2023-07-18 (×2): 0.5 mg via INTRAVENOUS

## 2023-07-18 MED ORDER — ACETAMINOPHEN 10 MG/ML IV SOLN
INTRAVENOUS | Status: AC
  Filled 2023-07-18: qty 100

## 2023-07-18 MED ORDER — EPHEDRINE SULFATE-NACL 50-0.9 MG/10ML-% IV SOSY
PREFILLED_SYRINGE | INTRAVENOUS | Status: DC | PRN
  Administered 2023-07-18: 5 mg via INTRAVENOUS

## 2023-07-18 MED ORDER — ACETAMINOPHEN 10 MG/ML IV SOLN
INTRAVENOUS | Status: DC | PRN
  Administered 2023-07-18: 1000 mg via INTRAVENOUS

## 2023-07-18 MED ORDER — ALBUMIN HUMAN 5 % IV SOLN: INTRAVENOUS | Status: DC | PRN

## 2023-07-18 MED ORDER — PHENOL 1.4 % MT LIQD: 1.0000 | OROMUCOSAL | Status: DC | PRN

## 2023-07-18 MED ORDER — VANCOMYCIN HCL 750 MG/150ML IV SOLN
750.0000 mg | Freq: Once | INTRAVENOUS | Status: AC
Start: 2023-07-18 — End: 2023-07-18
  Administered 2023-07-18: 750 mg via INTRAVENOUS
  Filled 2023-07-18: qty 150

## 2023-07-18 MED ORDER — MIDAZOLAM HCL 2 MG/2ML IJ SOLN
INTRAMUSCULAR | Status: DC | PRN
  Administered 2023-07-18: 1 mg via INTRAVENOUS

## 2023-07-18 MED ORDER — CHLORHEXIDINE GLUCONATE 0.12 % MT SOLN
15.0000 mL | Freq: Once | OROMUCOSAL | Status: AC
  Administered 2023-07-18: 15 mL via OROMUCOSAL
  Filled 2023-07-18: qty 15

## 2023-07-18 MED ORDER — ACETAMINOPHEN 500 MG PO TABS
1000.0000 mg | ORAL_TABLET | Freq: Four times a day (QID) | ORAL | Status: AC
  Administered 2023-07-18 – 2023-07-19 (×3): 1000 mg via ORAL
  Filled 2023-07-18 (×4): qty 2

## 2023-07-18 MED ORDER — THROMBIN 5000 UNITS EX SOLR
OROMUCOSAL | Status: DC | PRN
  Administered 2023-07-18: 5 mL via TOPICAL

## 2023-07-18 MED ORDER — OXYCODONE HCL 5 MG PO TABS
5.0000 mg | ORAL_TABLET | ORAL | Status: DC | PRN
  Administered 2023-07-18 (×3): 5 mg via ORAL
  Filled 2023-07-18 (×3): qty 1

## 2023-07-18 MED ORDER — BACITRACIN ZINC 500 UNIT/GM EX OINT
TOPICAL_OINTMENT | CUTANEOUS | Status: AC
  Filled 2023-07-18: qty 28.35

## 2023-07-18 MED ORDER — 0.9 % SODIUM CHLORIDE (POUR BTL) OPTIME
TOPICAL | Status: DC | PRN
  Administered 2023-07-18: 1000 mL

## 2023-07-18 MED ORDER — DEXAMETHASONE SODIUM PHOSPHATE 10 MG/ML IJ SOLN
INTRAMUSCULAR | Status: DC | PRN
  Administered 2023-07-18: 5 mg via INTRAVENOUS

## 2023-07-18 MED ORDER — LACTATED RINGERS IV SOLN: INTRAVENOUS | Status: DC

## 2023-07-18 SURGICAL SUPPLY — 44 items
BAG COUNTER SPONGE SURGICOUNT (BAG) ×1 IMPLANT
BAND RUBBER #18 3X1/16 STRL (MISCELLANEOUS) ×2 IMPLANT
BENZOIN TINCTURE PRP APPL 2/3 (GAUZE/BANDAGES/DRESSINGS) ×1 IMPLANT
BIT DRILL NEURO 2X3.1 SFT TUCH (MISCELLANEOUS) IMPLANT
BLADE CLIPPER SURG (BLADE) IMPLANT
BUR MATCHSTICK NEURO 3.0 LAGG (BURR) ×1 IMPLANT
BUR PRECISION FLUTE 6.0 (BURR) ×1 IMPLANT
CANISTER SUCT 3000ML PPV (MISCELLANEOUS) ×1 IMPLANT
DERMABOND ADVANCED .7 DNX12 (GAUZE/BANDAGES/DRESSINGS) IMPLANT
DRAPE LAPAROTOMY 100X72X124 (DRAPES) ×1 IMPLANT
DRAPE MICROSCOPE SLANT 54X150 (MISCELLANEOUS) ×1 IMPLANT
DRAPE SURG 17X23 STRL (DRAPES) ×4 IMPLANT
DRILL NEURO 2X3.1 SOFT TOUCH (MISCELLANEOUS) ×1 IMPLANT
DRSG OPSITE POSTOP 4X6 (GAUZE/BANDAGES/DRESSINGS) IMPLANT
ELECT BLADE 4.0 EZ CLEAN MEGAD (MISCELLANEOUS) ×1 IMPLANT
ELECT REM PT RETURN 9FT ADLT (ELECTROSURGICAL) ×1 IMPLANT
ELECTRODE BLDE 4.0 EZ CLN MEGD (MISCELLANEOUS) ×1 IMPLANT
ELECTRODE REM PT RTRN 9FT ADLT (ELECTROSURGICAL) ×1 IMPLANT
GAUZE 4X4 16PLY ~~LOC~~+RFID DBL (SPONGE) IMPLANT
GAUZE SPONGE 4X4 12PLY STRL (GAUZE/BANDAGES/DRESSINGS) ×1 IMPLANT
GLOVE BIO SURGEON STRL SZ8 (GLOVE) ×1 IMPLANT
GLOVE BIO SURGEON STRL SZ8.5 (GLOVE) ×1 IMPLANT
GLOVE EXAM NITRILE XL STR (GLOVE) IMPLANT
GOWN STRL REUS W/ TWL LRG LVL3 (GOWN DISPOSABLE) IMPLANT
GOWN STRL REUS W/ TWL XL LVL3 (GOWN DISPOSABLE) ×1 IMPLANT
GOWN STRL REUS W/TWL 2XL LVL3 (GOWN DISPOSABLE) IMPLANT
HEMOSTAT POWDER KIT SURGIFOAM (HEMOSTASIS) ×1 IMPLANT
KIT BASIN OR (CUSTOM PROCEDURE TRAY) ×1 IMPLANT
KIT TURNOVER KIT B (KITS) ×1 IMPLANT
NDL HYPO 21X1.5 SAFETY (NEEDLE) IMPLANT
NDL HYPO 22X1.5 SAFETY MO (MISCELLANEOUS) ×1 IMPLANT
NEEDLE HYPO 21X1.5 SAFETY (NEEDLE) IMPLANT
NEEDLE HYPO 22X1.5 SAFETY MO (MISCELLANEOUS) ×1 IMPLANT
NS IRRIG 1000ML POUR BTL (IV SOLUTION) ×1 IMPLANT
PACK LAMINECTOMY NEURO (CUSTOM PROCEDURE TRAY) ×1 IMPLANT
PAD ARMBOARD POSITIONER FOAM (MISCELLANEOUS) ×3 IMPLANT
PATTIES SURGICAL .5 X1 (DISPOSABLE) IMPLANT
SPONGE SURGIFOAM ABS GEL SZ50 (HEMOSTASIS) IMPLANT
STRIP CLOSURE SKIN 1/2X4 (GAUZE/BANDAGES/DRESSINGS) ×1 IMPLANT
SUT VIC AB 1 CT1 18XBRD ANBCTR (SUTURE) ×2 IMPLANT
SUT VIC AB 2-0 CP2 18 (SUTURE) ×2 IMPLANT
TOWEL GREEN STERILE (TOWEL DISPOSABLE) ×1 IMPLANT
TOWEL GREEN STERILE FF (TOWEL DISPOSABLE) ×1 IMPLANT
WATER STERILE IRR 1000ML POUR (IV SOLUTION) ×1 IMPLANT

## 2023-07-18 NOTE — Anesthesia Procedure Notes (Signed)
 Procedure Name: Intubation Date/Time: 07/18/2023 7:49 AM  Performed by: Mae Cianci J, CRNAPre-anesthesia Checklist: Patient identified, Emergency Drugs available, Suction available and Patient being monitored Patient Re-evaluated:Patient Re-evaluated prior to induction Oxygen Delivery Method: Circle System Utilized Preoxygenation: Pre-oxygenation with 100% oxygen Induction Type: IV induction Ventilation: Mask ventilation without difficulty Laryngoscope Size: Glidescope and 3 Grade View: Grade I Tube type: Oral Number of attempts: 1 Airway Equipment and Method: Stylet and Video-laryngoscopy Placement Confirmation: ETT inserted through vocal cords under direct vision, positive ETCO2 and breath sounds checked- equal and bilateral Secured at: 20 cm Tube secured with: Tape Dental Injury: Teeth and Oropharynx as per pre-operative assessment

## 2023-07-18 NOTE — Anesthesia Postprocedure Evaluation (Signed)
 Anesthesia Post Note  Patient: [name]  Procedure(s) Performed: THORACIC DISCECTOMY THORACIC ELEVEN-TWELVE DISCECTOMY (Spine Thoracic)     Patient location during evaluation: PACU Anesthesia Type: General Level of consciousness: awake and alert Pain management: pain level controlled Vital Signs Assessment: post-procedure vital signs reviewed and stable Respiratory status: spontaneous breathing, nonlabored ventilation and respiratory function stable Cardiovascular status: blood pressure returned to baseline and stable Postop Assessment: no apparent nausea or vomiting Anesthetic complications: no   No notable events documented.  Last Vitals:  Vitals:   07/18/23 1100 07/18/23 1115  BP: (!) 111/53 (!) 108/55  Pulse: 80 64  Resp: (!) 24 10  Temp:  (!) 36.4 C  SpO2: 100% 95%    Last Pain:  Vitals:   07/18/23 1115  TempSrc:   PainSc: 0-No pain                 Earvin Goldberg

## 2023-07-18 NOTE — H&P (Signed)
 Subjective: The patient is a 73 year old white female who has had multiple prior spine surgeries.  She has developed pain at the thoracolumbar junction bilaterally.  She failed medical management and was worked up with a thoracic MRI which demonstrated significant stenosis and herniated disc at T11-12.  I discussed the various treatment options with her.  She has decided proceed with a thoracic laminectomy.  Past Medical History:  Diagnosis Date   Asthma    Albuterol in haler prn   Cataract    immature unsure which eye   Chronic back pain    COVID-19 October/November 2020   Degenerative disk disease    psoriatic   Diabetes mellitus without complication (HCC)    diet controlled   Elevated liver enzymes    r/t leflunamide (this medication is put on hold intermittently due to this)   Esophageal motility disorder    Non-specific, see modified barium study/speech path, BP   GERD (gastroesophageal reflux disease)    takes Protonix bid   History of blood transfusion    post c-section   History of bronchitis    History of hiatal hernia    HTN (hypertension)    Hyperlipidemia    Lymphocytic colitis 05/26/2010   Responded to Entocort x 3 MOS   Neuropathy    Nonallergic rhinitis    Osteoporosis    gets Prolia every 6 months   Peripheral edema    Pneumonia 02/2019   Psoriatic arthritis (HCC)    Dr. Anthonette Legato   Psoriatic arthritis [facility])    bilateral hands   Raynaud's disease    Seasonal allergies    Shingles 2020   SUI (stress urinary incontinence, female)    Urinary urgency     Past Surgical History:  Procedure Laterality Date   ANTERIOR CERVICAL DECOMP/DISCECTOMY FUSION N/A 03/09/2020   Procedure: ANTERIOR CERVICAL DECOMPRESSION/DISCECTOMY FUSION, INTERBODY PROSTHESIS, PLATE SCREWS CERVICAL FIVE-SIX, CERVICAL SIX-SEVEN;  Surgeon: Tressie Stalker, MD;  Location: [facility] OR;  Service: Neurosurgery;  Laterality: N/A;   BACK SURGERY  2003   BALLOON DILATION N/A 11/12/2022   Procedure:  BALLOON DILATION;  Surgeon: Lanelle Bal, DO;  [facility];  Service: Endoscopy;  Laterality: N/A;   BIOPSY  04/17/2016   Procedure: BIOPSY;  Surgeon: West Bali, MD;  Location: [facility];  Service: Endoscopy;;  random colon bx's   BIOPSY  11/12/2022   Procedure: BIOPSY;  Surgeon: Lanelle Bal, DO;  Location: [facility];  Service: Endoscopy;;   CERVICAL SPINE SURGERY  09/09/2017   CESAREAN SECTION  1974, 1978       CHOLECYSTECTOMY  04/2023   COLONOSCOPY  06/19/2008   [id number] internal hemorrhoids/mild sigmoin colon diverticulosis   COLONOSCOPY  2012   Dr. Jena Gauss: normal rectum, diverticula, lymphocytic colitis    COLONOSCOPY WITH PROPOFOL N/A 04/17/2016   Dr. Darrick Penna: Normal terminal ileum, internal/external hemorrhoids, random colon biopsies consistent with collagenous colitis   DILATION AND CURETTAGE OF UTERUS  1977   spontaneous abortion   ESOPHAGOGASTRODUODENOSCOPY  06/19/2008   [id number] gastritis   ESOPHAGOGASTRODUODENOSCOPY (EGD) WITH PROPOFOL N/A 11/12/2022   Procedure: ESOPHAGOGASTRODUODENOSCOPY (EGD) WITH PROPOFOL;  Surgeon: Lanelle Bal, DO;  [facility];  Service: Endoscopy;  Laterality: N/A;  8:30 am, asa 3   LUMBAR DISC ARTHROPLASTY  09/1996   LUMBAR FUSION  6/03, 11/08, 11/14   L4-5 fusion, L2-3 fusion, L1-2   LUMBAR FUSION  08/03/2020   L4-L5   LUMBAR LAMINECTOMY  03/2020   MOUTH SURGERY  12/07/2021   oral surgery   ODONTOID SCREW INSERTION N/A 01/27/2018   Procedure: ODONTOID SCREW INSERTION;  Surgeon: Garry Kansas, MD;  Location: [facility] OR;  Service: Neurosurgery;  Laterality: N/A;  ODONTOID SCREW INSERTION   TONSILLECTOMY     TONSILLECTOMY AND ADENOIDECTOMY     TUBAL LIGATION      Allergies  Allergen Reactions   Adalimumab Rash and Other (See Comments)    FEVER, Fast pulse Humira   Imuran [Azathioprine Sodium] Rash    Denies Airway involvement   Methotrexate Other (See Comments)    Liver Enzyme elevation  - after 1 month of use Other reaction(s): Unknown   Plaquenil [Hydroxychloroquine Sulfate] Rash    Denies airway involvement.    Sulfonamide Derivatives Rash    Occurred with Sulfasalazine. Rash only.    Ceftin [Cefuroxime Axetil] Nausea Only    Upset stomach   Lisinopril Cough   Sulfamethoxazole-Trimethoprim     Other reaction(s): Unknown   Morphine Nausea And Vomiting    IV morphine caused N and V  After surgery.   Has tolerated Kadian in the past     Social History   Tobacco Use   Smoking status: Never    Passive exposure: Never   Smokeless tobacco: Never  Substance Use Topics   Alcohol use: No    Family History  Problem Relation Age of Onset   Diabetes Mother    Pancreatitis Mother    Arthritis Mother        psoriatic    Fibromyalgia Mother    Rheumatic fever Father    Diabetes Other        grandparents   Skin cancer Other        grandfather   Hypertension Other        grandparent   Congestive Heart Failure Other        grandfather   Parkinson's disease Other        grandmother   Colon cancer Maternal Uncle    Fibromyalgia Sister    Rheum arthritis Sister    Diabetes Sister    Fibromyalgia Sister    Diabetes Sister    Rheum arthritis Sister    Hypertension Son    Colon polyps Neg Hx    Prior to Admission medications   Medication Sig Start Date End Date Taking? Authorizing Provider  aspirin 81 MG tablet Take 81 mg by mouth daily.   Yes [provider]  chlorhexidine (PERIDEX) 0.12 % solution Use as directed 15 mLs in the mouth or throat at bedtime.   Yes [provider]  cholecalciferol (VITAMIN D3) 25 MCG (1000 UNIT) tablet Take 1,000 Units by mouth at bedtime.   Yes [provider]  empagliflozin (JARDIANCE) 25 MG TABS tablet Take 25 mg by mouth daily.   Yes [provider]  fish oil-omega-3 fatty acids 1000 MG capsule Take 2 capsules (2 g total) by mouth daily. 2-4 GRAMS DAILY Patient taking differently: Take 1 g by  mouth 2 (two) times daily. 03/29/22  Yes Hilty, Aviva Lemmings, MD  gabapentin (NEURONTIN) 600 MG tablet Take 1 tablet (600 mg total) by mouth 3 (three) times daily. For nerve pain 12/19/22  Yes Lovorn, Megan, MD  Ginger, Zingiber officinalis, (GINGER ROOT) 500 MG CAPS Take 500 mg by mouth daily.   Yes [provider]  Glucosamine Sulfate 1000 MG TABS Take 2,000 mg by mouth daily. 07/03/11  Yes Hensel, Azucena Bollard, MD  HYDROcodone-acetaminophen (NORCO) 7.5-325 MG tablet Take 1 tablet  by mouth every 6 (six) hours as needed for moderate pain (pain score 4-6) or severe pain (pain score 7-10). For chronic pain 05/21/23  Yes Jodi Munroe, NP  hydrocortisone sodium succinate (SOLU-CORTEF) 100 MG SOLR injection Inject 100 mg into the muscle daily as needed (when unable to keep down prednisone tablet).    Yes [provider]  ixekizumab (TALTZ) 80 MG/ML pen Inject 1 mL (80 mg total) into the skin every 28 (twenty-eight) days. 04/12/23  Yes Deveshwar, Clydie Darter, MD  leflunomide (ARAVA) 10 MG tablet Take 1 tablet (10 mg total) by mouth daily. 07/02/23  Yes Romayne Clubs, PA-C  metoprolol succinate (TOPROL-XL) 50 MG 24 hr tablet Take 1 tablet (50 mg total) by mouth daily. Take with or immediately following a meal. 07/03/11  Yes Hensel, Azucena Bollard, MD  Misc Natural Products (TART CHERRY ADVANCED PO) Take 3,000 mg by mouth daily. daily   Yes [provider]  Multiple Vitamin (MULTIVITAMIN) tablet Take 1 tablet by mouth 3 (three) times a week.   Yes [provider]  nortriptyline (PAMELOR) 25 MG capsule TAKE (1) CAPSULE BY MOUTH AT BEDTIME. 12/05/17  Yes Romayne Clubs, PA-C  olmesartan (BENICAR) 40 MG tablet Take 40 mg by mouth daily.   Yes [provider]  OTEZLA 30 MG TABS Take 1 tablet (30 mg total) by mouth 2 (two) times daily. 07/10/23  Yes Deveshwar, Clydie Darter, MD  oxybutynin (DITROPAN-XL) 5 MG 24 hr tablet Take 1 tablet (5 mg total) by mouth at bedtime. 12/13/22  Yes Lillian Rein, MD   pantoprazole (PROTONIX) 40 MG tablet TAKE (1) TABLET BY MOUTH TWICE DAILY. 02/01/23  Yes Lanney Pitts, PA-C  Polyethyl Glycol-Propyl Glycol (SYSTANE OP) Place 1 drop into both eyes 4 (four) times daily as needed (dry eyes).   Yes [provider]  potassium chloride (KLOR-CON) 20 MEQ packet Take 20 mEq by mouth daily.   Yes [provider]  predniSONE (DELTASONE) 5 MG tablet TAKE 1 TABLET BY MOUTH DAILY WITH BREAKFAST 07/10/23  Yes Deveshwar, Clydie Darter, MD  spironolactone (ALDACTONE) 25 MG tablet Take 25 mg by mouth daily.   Yes [provider]  topiramate (TOPAMAX) 200 MG tablet TAKE (1) TABLET BY MOUTH ONCE AT BEDTIME. 07/02/23  Yes Romayne Clubs, PA-C  Turmeric 500 MG CAPS Take 500 mg by mouth every morning.   Yes [provider]  albuterol (PROVENTIL HFA;VENTOLIN HFA) 108 (90 BASE) MCG/ACT inhaler Inhale 2 puffs into the lungs every 6 (six) hours as needed for wheezing or shortness of breath.     [provider]  Blood Glucose Monitoring Suppl (ONETOUCH VERIO FLEX SYSTEM) w/Device KIT  05/15/22   [provider]  cetirizine (ZYRTEC) 10 MG tablet Take 10 mg by mouth daily as needed (allergies).    [provider]  Lancets (ONETOUCH DELICA PLUS LANCET33G) MISC USE TO TEST 3 TIMESUDAILY. 07/01/19   [provider]  ONETOUCH VERIO test strip  03/05/18   [provider]     Review of Systems  Positive ROS: As above  All other systems have been reviewed and were otherwise negative with the exception of those mentioned in the HPI and as above.  Objective: Vital signs in last 24 hours: Temp:  [98.2 F (36.8 C)] 98.2 F (36.8 C) (04/17 0551) Pulse Rate:  [70] 70 (04/17 0551) Resp:  [18] 18 (04/17 0551) BP: (145)/(72) 145/72 (04/17 0551) SpO2:  [99 %] 99 % (04/17 0551) Weight:  [53.5  kg] 53.5 kg (04/17 0551) Estimated body mass index is 24.66 kg/m as calculated from the following:   Height as of this encounter: 4'  10" (1.473 m).   Weight as of this encounter: 53.5 kg.   General Appearance: Alert Head: Normocephalic, without obvious abnormality, atraumatic Eyes: PERRL, conjunctiva/corneas clear, EOM's intact,    Ears: Normal  Throat: Normal  Neck: Very limited range of motion. Back: Her lumbar incisions are well-healed. Lungs: Clear to auscultation bilaterally, respirations unlabored Heart: Regular rate and rhythm, no murmur, rub or gallop Abdomen: Soft, non-tender Extremities: Extremities normal, atraumatic, no cyanosis or edema Skin: unremarkable  NEUROLOGIC:   Mental status: alert and oriented,Motor Exam - grossly normal Sensory Exam - grossly normal Reflexes:  Coordination - grossly normal Gait - grossly normal Balance - grossly normal Cranial Nerves: I: smell Not tested  II: visual acuity  OS: Normal  OD: Normal   II: visual fields Full to confrontation  II: pupils Equal, round, reactive to light  III,VII: ptosis None  III,IV,VI: extraocular muscles  Full ROM  V: mastication Normal  V: facial light touch sensation  Normal  V,VII: corneal reflex  Present  VII: facial muscle function - upper  Normal  VII: facial muscle function - lower Normal  VIII: hearing Not tested  IX: soft palate elevation  Normal  IX,X: gag reflex Present  XI: trapezius strength  5/5  XI: sternocleidomastoid strength 5/5  XI: neck flexion strength  5/5  XII: tongue strength  Normal    Data Review Lab Results  Component Value Date   WBC 12.3 (H) 07/08/2023   HGB 12.2 07/08/2023   HCT 37.7 07/08/2023   MCV 97.2 07/08/2023   PLT 244 07/08/2023   Lab Results  Component Value Date   NA 136 07/08/2023   K 4.5 07/08/2023   CL 106 07/08/2023   CO2 21 (L) 07/08/2023   BUN 38 (H) 07/08/2023   CREATININE 1.02 (H) 07/08/2023   GLUCOSE 139 (H) 07/08/2023   No results found for: "INR", "PROTIME"  Assessment/Plan: Thoracic spinal stenosis, thoracic spine pain, thoracic myelopathy: I have discussed  the situation with the patient and her husband.  I reviewed her imaging studies with him and pointed out the abnormalities.  We have discussed the various treatment options including surgery.  I have described the surgical treatment option of a T11-12 laminectomy.  I have shown her surgical models.  I have given her a surgical pamphlet.  We have discussed the risk, benefits, alternatives, expected postoperative course, and likelihood of achieving our goals with surgery.  I have answered all their questions.  She has decided proceed with surgery.   Elder Greening 07/18/2023 7:27 AM

## 2023-07-18 NOTE — Telephone Encounter (Signed)
 Please refill or advise. Dr. Lovorn is off this week. Thank you.

## 2023-07-18 NOTE — Telephone Encounter (Signed)
 Last office visit note reviewed; medication refilled.

## 2023-07-18 NOTE — Op Note (Signed)
 Brief history: The patient is a 73 year old white female who has had multiple prior back surgeries/fusions.  She has developed pain and radicular symptoms in the lower thoracic region.  She failed medical management.  She was worked up with a lumbar MRI which demonstrated multilevel degenerative changes most significant at T11-12.  I discussed the various treatment options with her.  She has decided proceed with surgery.  Preoperative diagnosis: T11-12 spinal stenosis, herniated disc, thoracic spine pain, thoracic radiculopathy  Postoperative diagnosis: The same  Procedure: T11-12 laminectomy using microdissection  Surgeon: Dr. Pleasant Brilliant  Asst.: Marlana Silvan, NP  Anesthesia: Gen. endotracheal  Estimated blood loss: 75 cc  Drains: None  Complications: None  Description of procedure: The patient was brought to the operating room by the anesthesia team. General endotracheal anesthesia was induced. The patient was turned to the prone position on the Wilson frame. The patient's thoracolumbar region was then prepared with Betadine scrub and Betadine solution. Sterile drapes were applied.  I then injected the area to be incised with Marcaine with epinephrine solution. I then used a scalpel to make a linear midline incision over the T11-12 intervertebral disc space. I then used electrocautery to perform a bilateral subperiosteal dissection exposing the spinous process and lamina of T11 and T12. We obtained intraoperative radiograph to confirm our location. I then inserted the [facility] retractor for exposure.  I used electrocautery to incise the T10-11, T11-12 spinous ligament.  I used the rongeur to remove the spinous process at T11 and the caudal aspect of the T10 and cephalad aspect of the T12 spinous process.  We then brought the operative microscope into the field. Under its magnification and illumination we completed the microdissection. I used a high-speed drill to perform a laminotomy  at T11-12 bilaterally. I then used a Kerrison punches to complete the T11 laminectomy and removed the ligamentum flavum at T10-11 and T11-12, decompressing the thecal sac. I then used a Kerrison punch to perform a limited foraminotomy at about the bilateral T12 nerve root, staying lateral to the thecal sac.  I inspected lateral to the thecal sac at T11-12 on the right and did not see any significant lateral herniated disc which could be safely removed.   We then obtained hemostasis using bipolar electrocautery. We irrigated the wound out with saline solution. We then removed the retractor. We then reapproximated the patient's thoracolumbar fascia with interrupted #1 Vicryl suture. We then reapproximated the patient's subcutaneous tissue with interrupted 2-0 Vicryl suture. We then reapproximated patient's skin with Steri-Strips and benzoin. The was then coated with bacitracin ointment. The drapes were removed. The patient was subsequently returned to the supine position where they were extubated by the anesthesia team. The patient was then transported to the postanesthesia care unit in stable condition. All sponge instrument and needle counts were reportedly correct at the end of this case.

## 2023-07-18 NOTE — Transfer of Care (Signed)
 Immediate Anesthesia Transfer of Care Note  Patient: Tonya Hall  Procedure(s) Performed: THORACIC DISCECTOMY THORACIC ELEVEN-TWELVE DISCECTOMY (Spine Thoracic)  Patient Location: PACU  Anesthesia Type:General  Level of Consciousness: awake, alert , and oriented  Airway & Oxygen Therapy: Patient Spontanous Breathing and Patient connected to face mask oxygen  Post-op Assessment: Report given to RN and Post -op Vital signs reviewed and stable  Post vital signs: Reviewed  Last Vitals:  Vitals Value Taken Time  BP 146/60 07/18/23 0945  Temp 36.6 C 07/18/23 0945  Pulse 78 07/18/23 0948  Resp 14 07/18/23 0948  SpO2 100 % 07/18/23 0948  Vitals shown include unfiled device data.  Last Pain:  Vitals:   07/18/23 0608  TempSrc:   PainSc: 4       Patients Stated Pain Goal: 1 (07/18/23 5784)  Complications: No notable events documented.

## 2023-07-19 ENCOUNTER — Encounter (HOSPITAL_COMMUNITY): Payer: Self-pay | Admitting: Neurosurgery

## 2023-07-19 DIAGNOSIS — M4804 Spinal stenosis, thoracic region: Secondary | ICD-10-CM | POA: Diagnosis not present

## 2023-07-19 LAB — GLUCOSE, CAPILLARY: Glucose-Capillary: 99 mg/dL (ref 70–99)

## 2023-07-19 MED ORDER — GINGER ROOT 500 MG PO CAPS: 500.0000 mg | ORAL_CAPSULE | Freq: Every day | ORAL | Status: DC

## 2023-07-19 MED ORDER — ASPIRIN 81 MG PO TBEC
81.0000 mg | DELAYED_RELEASE_TABLET | Freq: Every day | ORAL | Status: DC
  Administered 2023-07-19: 81 mg via ORAL
  Filled 2023-07-19: qty 1

## 2023-07-19 MED ORDER — POTASSIUM CHLORIDE 20 MEQ PO PACK
20.0000 meq | PACK | Freq: Every day | ORAL | Status: DC
  Administered 2023-07-19: 20 meq via ORAL
  Filled 2023-07-19: qty 1

## 2023-07-19 MED ORDER — OXYBUTYNIN CHLORIDE ER 5 MG PO TB24
5.0000 mg | ORAL_TABLET | Freq: Every day | ORAL | Status: DC
  Filled 2023-07-19: qty 1

## 2023-07-19 MED ORDER — EMPAGLIFLOZIN 25 MG PO TABS
25.0000 mg | ORAL_TABLET | Freq: Every day | ORAL | Status: DC
  Administered 2023-07-19: 25 mg via ORAL
  Filled 2023-07-19: qty 1

## 2023-07-19 MED ORDER — GLUCOSAMINE SULFATE 1000 MG PO TABS: 2000.0000 mg | ORAL_TABLET | Freq: Every day | ORAL | Status: DC

## 2023-07-19 MED ORDER — GABAPENTIN 300 MG PO CAPS
600.0000 mg | ORAL_CAPSULE | Freq: Three times a day (TID) | ORAL | Status: DC
  Administered 2023-07-19: 600 mg via ORAL
  Filled 2023-07-19: qty 2

## 2023-07-19 MED ORDER — TOPIRAMATE 100 MG PO TABS: 200.0000 mg | ORAL_TABLET | Freq: Every day | ORAL | Status: DC

## 2023-07-19 MED ORDER — POLYVINYL ALCOHOL 1.4 % OP SOLN: Freq: Four times a day (QID) | OPHTHALMIC | Status: DC | PRN

## 2023-07-19 MED ORDER — METOPROLOL SUCCINATE ER 50 MG PO TB24
50.0000 mg | ORAL_TABLET | Freq: Every day | ORAL | Status: DC
  Administered 2023-07-19: 50 mg via ORAL
  Filled 2023-07-19: qty 1

## 2023-07-19 MED ORDER — LORATADINE 10 MG PO TABS
10.0000 mg | ORAL_TABLET | Freq: Every day | ORAL | Status: DC
  Administered 2023-07-19: 10 mg via ORAL
  Filled 2023-07-19: qty 1

## 2023-07-19 MED ORDER — OXYCODONE HCL 5 MG PO TABS: 5.0000 mg | ORAL_TABLET | ORAL | 0 refills | Status: DC | PRN

## 2023-07-19 MED ORDER — ALBUTEROL SULFATE (2.5 MG/3ML) 0.083% IN NEBU
3.0000 mL | INHALATION_SOLUTION | Freq: Four times a day (QID) | RESPIRATORY_TRACT | Status: DC | PRN
Start: 2023-07-19 — End: 2023-07-19

## 2023-07-19 MED ORDER — SPIRONOLACTONE 25 MG PO TABS
25.0000 mg | ORAL_TABLET | Freq: Every day | ORAL | Status: DC
  Administered 2023-07-19: 25 mg via ORAL
  Filled 2023-07-19: qty 1

## 2023-07-19 MED ORDER — LEFLUNOMIDE 10 MG PO TABS
10.0000 mg | ORAL_TABLET | Freq: Every day | ORAL | Status: DC
Start: 2023-07-19 — End: 2023-07-19
  Filled 2023-07-19: qty 1

## 2023-07-19 MED ORDER — TURMERIC 500 MG PO CAPS: 500.0000 mg | ORAL_CAPSULE | Freq: Every morning | ORAL | Status: DC

## 2023-07-19 MED ORDER — INSULIN ASPART 100 UNIT/ML IJ SOLN: 0.0000 [IU] | INTRAMUSCULAR | Status: DC

## 2023-07-19 MED ORDER — IRBESARTAN 300 MG PO TABS
300.0000 mg | ORAL_TABLET | Freq: Every day | ORAL | Status: DC
  Administered 2023-07-19: 300 mg via ORAL
  Filled 2023-07-19: qty 1

## 2023-07-19 MED ORDER — ADULT MULTIVITAMIN W/MINERALS CH
1.0000 | ORAL_TABLET | ORAL | Status: DC
  Administered 2023-07-19: 1 via ORAL
  Filled 2023-07-19: qty 1

## 2023-07-19 MED ORDER — APREMILAST 30 MG PO TABS: 30.0000 mg | ORAL_TABLET | Freq: Two times a day (BID) | ORAL | Status: DC

## 2023-07-19 MED ORDER — PREDNISONE 5 MG PO TABS: 5.0000 mg | ORAL_TABLET | Freq: Every day | ORAL | Status: DC

## 2023-07-19 MED ORDER — NORTRIPTYLINE HCL 25 MG PO CAPS
25.0000 mg | ORAL_CAPSULE | Freq: Every day | ORAL | Status: DC
  Filled 2023-07-19: qty 1

## 2023-07-19 MED ORDER — DIAZEPAM 5 MG PO TABS: 5.0000 mg | ORAL_TABLET | Freq: Four times a day (QID) | ORAL | 0 refills | Status: DC | PRN

## 2023-07-19 MED ORDER — CHLORHEXIDINE GLUCONATE 0.12 % MT SOLN: 15.0000 mL | Freq: Every day | OROMUCOSAL | Status: DC

## 2023-07-19 MED ORDER — PANTOPRAZOLE SODIUM 40 MG PO TBEC
40.0000 mg | DELAYED_RELEASE_TABLET | Freq: Two times a day (BID) | ORAL | Status: DC
  Administered 2023-07-19: 40 mg via ORAL
  Filled 2023-07-19: qty 1

## 2023-07-19 NOTE — Plan of Care (Signed)
 Problem: Education: Goal: Knowledge of General Education information will improve Description: Including pain rating scale, medication(s)/side effects and non-pharmacologic comfort measures Outcome: Adequate for Discharge   Problem: Health Behavior/Discharge Planning: Goal: Ability to manage health-related needs will improve Outcome: Adequate for Discharge   Problem: Clinical Measurements: Goal: Ability to maintain clinical measurements within normal limits will improve Outcome: Adequate for Discharge Goal: Will remain free from infection Outcome: Adequate for Discharge Goal: Diagnostic test results will improve Outcome: Adequate for Discharge Goal: Respiratory complications will improve Outcome: Adequate for Discharge Goal: Cardiovascular complication will be avoided Outcome: Adequate for Discharge   Problem: Activity: Goal: Risk for activity intolerance will decrease Outcome: Adequate for Discharge   Problem: Nutrition: Goal: Adequate nutrition will be maintained Outcome: Adequate for Discharge   Problem: Coping: Goal: Level of anxiety will decrease Outcome: Adequate for Discharge   Problem: Elimination: Goal: Will not experience complications related to bowel motility Outcome: Adequate for Discharge Goal: Will not experience complications related to urinary retention Outcome: Adequate for Discharge   Problem: Pain Managment: Goal: General experience of comfort will improve and/or be controlled Outcome: Adequate for Discharge   Problem: Safety: Goal: Ability to remain free from injury will improve Outcome: Adequate for Discharge   Problem: Skin Integrity: Goal: Risk for impaired skin integrity will decrease Outcome: Adequate for Discharge   Problem: Education: Goal: Ability to verbalize activity precautions or restrictions will improve Outcome: Adequate for Discharge Goal: Knowledge of the prescribed therapeutic regimen will improve Outcome: Adequate for  Discharge Goal: Understanding of discharge needs will improve Outcome: Adequate for Discharge   Problem: Activity: Goal: Ability to avoid complications of mobility impairment will improve Outcome: Adequate for Discharge Goal: Ability to tolerate increased activity will improve Outcome: Adequate for Discharge Goal: Will remain free from falls Outcome: Adequate for Discharge   Problem: Bowel/Gastric: Goal: Gastrointestinal status for postoperative course will improve Outcome: Adequate for Discharge   Problem: Clinical Measurements: Goal: Ability to maintain clinical measurements within normal limits will improve Outcome: Adequate for Discharge Goal: Postoperative complications will be avoided or minimized Outcome: Adequate for Discharge Goal: Diagnostic test results will improve Outcome: Adequate for Discharge   Problem: Pain Management: Goal: Pain level will decrease Outcome: Adequate for Discharge   Problem: Skin Integrity: Goal: Will show signs of wound healing Outcome: Adequate for Discharge   Problem: Health Behavior/Discharge Planning: Goal: Identification of resources available to assist in meeting health care needs will improve Outcome: Adequate for Discharge   Problem: Bladder/Genitourinary: Goal: Urinary functional status for postoperative course will improve Outcome: Adequate for Discharge   Problem: Education: Goal: Ability to describe self-care measures that may prevent or decrease complications (Diabetes Survival Skills Education) will improve Outcome: Adequate for Discharge Goal: Individualized Educational Video(s) Outcome: Adequate for Discharge   Problem: Coping: Goal: Ability to adjust to condition or change in health will improve Outcome: Adequate for Discharge   Problem: Fluid Volume: Goal: Ability to maintain a balanced intake and output will improve Outcome: Adequate for Discharge   Problem: Health Behavior/Discharge Planning: Goal: Ability  to identify and utilize available resources and services will improve Outcome: Adequate for Discharge Goal: Ability to manage health-related needs will improve Outcome: Adequate for Discharge   Problem: Metabolic: Goal: Ability to maintain appropriate glucose levels will improve Outcome: Adequate for Discharge   Problem: Nutritional: Goal: Maintenance of adequate nutrition will improve Outcome: Adequate for Discharge Goal: Progress toward achieving an optimal weight will improve Outcome: Adequate for Discharge   Problem: Skin Integrity: Goal:  Risk for impaired skin integrity will decrease Outcome: Adequate for Discharge   Problem: Tissue Perfusion: Goal: Adequacy of tissue perfusion will improve Outcome: Adequate for Discharge

## 2023-07-19 NOTE — Evaluation (Signed)
 Physical Therapy Evaluation Patient Details Name: Tonya Hall MRN: [id number] DOB: [birth year] Today's Date: 07/19/2023  History of Present Illness  73 yo female s/p T11-12 laminectomy using microdissection on 4/17. PMH includes L4-5 PLIF, Raynaud's disease, psoriateic arthritis, PNA, peripheral edema, osteoporosis, neuropathy, Fibromyalgia, DM, COVID - 19, s/p lumber fusion, s/p cervical fusion, HTN, HLD.  Clinical Impression   Pt presents with post-operative back pain, good understanding of back precautions, increased time and effort to mobilize, and decreased activity tolerance. Pt ambulated good hallway distance with use of quad cane, proficiently navigated steps demonstrating ability to enter home, and maintained back precautions well. Pt's main issue at this time is pain control, is awaiting pain medication. All PT education completed, pt appropriate to d/c home with support of husband from a PT perspective.          If plan is discharge home, recommend the following: A little help with bathing/dressing/bathroom   Can travel by private vehicle        Equipment Recommendations None recommended by PT  Recommendations for Other Services       Functional Status Assessment Patient has not had a recent decline in their functional status     Precautions / Restrictions Precautions Precautions: Fall;Back Precaution Booklet Issued: Yes (comment) Recall of Precautions/Restrictions: Intact Precaution/Restrictions Comments: no brace needed, reviewed BLT rules Restrictions Weight Bearing Restrictions Per Provider Order: No      Mobility  Bed Mobility Overal bed mobility: Modified Independent             General bed mobility comments: sitting EOB, pt states she has been log rolling in and out of bed    Transfers Overall transfer level: Modified independent Equipment used: Quad cane               General transfer comment: slow to rise, no physical assist     Ambulation/Gait Ambulation/Gait assistance: Supervision Gait Distance (Feet): 90 Feet Assistive device: Quad cane Gait Pattern/deviations: Step-through pattern, Decreased stride length, Trunk flexed Gait velocity: decr     General Gait Details: cues for upright posture, and sequencing with quad cane  Stairs Stairs: Yes Stairs assistance: Supervision Stair Management: Two rails, Step to pattern, Forwards Number of Stairs: 4    Wheelchair Mobility     Tilt Bed    Modified Rankin (Stroke Patients Only)       Balance Overall balance assessment: Modified Independent, History of Falls                                           Pertinent Vitals/Pain Pain Assessment Pain Assessment: 0-10 Pain Score: 4  Pain Location: thoracic spine Pain Descriptors / Indicators: Discomfort, Grimacing, Guarding Pain Intervention(s): Limited activity within patient's tolerance    Home Living Family/patient expects to be discharged to:: Private residence Living Arrangements: Spouse/significant other Available Help at Discharge: Family Type of Home: House Home Access: Stairs to enter Entrance Stairs-Rails: Doctor, general practice of Steps: 3   Home Layout: Two level;Able to live on main level with bedroom/bathroom Home Equipment: Rolling Walker (2 wheels);BSC/3in1;Cane - single point;Shower seat;Grab bars - tub/shower      Prior Function Prior Level of Function : Independent/Modified Independent;Driving;History of Falls (last six months)             Mobility Comments: using cane PRN ADLs Comments: doing everything for self, but slower vs normal  Extremity/Trunk Assessment   Upper Extremity Assessment Upper Extremity Assessment: Defer to OT evaluation    Lower Extremity Assessment Lower Extremity Assessment: Overall WFL for tasks assessed    Cervical / Trunk Assessment Cervical / Trunk Assessment: Kyphotic;Back Surgery  Communication    Communication Communication: No apparent difficulties    Cognition Arousal: Alert Behavior During Therapy: WFL for tasks assessed/performed   PT - Cognitive impairments: No apparent impairments                         Following commands: Intact       Cueing Cueing Techniques: Verbal cues     General Comments   Home walking program: up and walking 1x/hour during waking hours for short household distances with supervision of family, to promote circulation, activity tolerance, and strength maintenance.      Exercises     Assessment/Plan    PT Assessment Patient does not need any further PT services  PT Problem List         PT Treatment Interventions      PT Goals (Current goals can be found in the Care Plan section)  Acute Rehab PT Goals PT Goal Formulation: With patient Time For Goal Achievement: 07/19/23 Potential to Achieve Goals: Good    Frequency       Co-evaluation               AM-PAC PT "6 Clicks" Mobility  Outcome Measure Help needed turning from your back to your side while in a flat bed without using bedrails?: None Help needed moving from lying on your back to sitting on the side of a flat bed without using bedrails?: None Help needed moving to and from a bed to a chair (including a wheelchair)?: None Help needed standing up from a chair using your arms (e.g., wheelchair or bedside chair)?: None Help needed to walk in hospital room?: A Little Help needed climbing 3-5 steps with a railing? : A Little 6 Click Score: 22    End of Session   Activity Tolerance: Patient tolerated treatment well Patient left: in bed;with call bell/phone within reach;with family/visitor present Nurse Communication: Mobility status PT Visit Diagnosis: Other abnormalities of gait and mobility (R26.89)    Time: 0940-1000 PT Time Calculation (min) (ACUTE ONLY): 20 min   Charges:   PT Evaluation $PT Eval Low Complexity: 1 Low   PT General Charges $$  ACUTE PT VISIT: 1 Visit         Shirlene Doughty, PT DPT Acute Rehabilitation Services Secure Chat Preferred  Office [phone]   Meri Pelot Cydney Draft 07/19/2023, 10:36 AM

## 2023-07-19 NOTE — Plan of Care (Signed)
  Problem: Clinical Measurements: Goal: Will remain free from infection Outcome: Progressing   Problem: Activity: Goal: Risk for activity intolerance will decrease Outcome: Progressing   Problem: Pain Managment: Goal: General experience of comfort will improve and/or be controlled Outcome: Progressing

## 2023-07-19 NOTE — Discharge Summary (Signed)
 BP (!) 147/65 (BP Location: Left Arm)   Pulse 93   Temp 97.8 F (36.6 C) (Oral)   Resp 16   Ht 4\' 10"  (1.473 m)   Wt 53.5 kg   LMP 02/01/1999   SpO2 97%   BMI 24.66 kg/m  Physician Discharge Summary  Patient ID: Tonya Hall MRN: [id number] DOB/AGE: 74-09-1950 74 y.o.  Admit date: 07/18/2023 Discharge date: 07/19/2023  Admission Diagnoses:thoracic spinal stenosis T11-12  Discharge Diagnoses:  Principal Problem:   Degenerative thoracic spinal stenosis Active Problems:   Myelopathy concurrent with and due to spinal stenosis of thoracic region [facility])   Discharged Condition: good  Hospital Course: Mrs. Malmberg was admitted and taken to the operating room for an uncomplicated thoracic laminectomy for stenosis at T11/12 Post op she is ambulating, voiding, and tolerating a regular diet. Her dressing is dry, there are no signs of infection. She will be discharged home Treatments: surgery: T11-12 laminectomy using microdissection   Discharge Exam: Blood pressure (!) 147/65, pulse 93, temperature 97.8 F (36.6 C), temperature source Oral, resp. rate 16, height 4\' 10"  (1.473 m), weight 53.5 kg, last menstrual period 02/01/1999, SpO2 97%. General appearance: alert, cooperative, appears stated age, and mild distress  Disposition: Discharge disposition: 01-Home or Self Care      DEGENERATIVE THORACIC SPINAL STENOSIS  Allergies as of 07/19/2023       Reactions   Adalimumab Rash, Other (See Comments)   FEVER, Fast pulse Humira   Imuran [azathioprine Sodium] Rash   Denies Airway involvement   Methotrexate Other (See Comments)   Liver Enzyme elevation - after 1 month of use Other reaction(s): Unknown   Plaquenil [hydroxychloroquine Sulfate] Rash   Denies airway involvement.    Sulfonamide Derivatives Rash   Occurred with Sulfasalazine. Rash only.    Ceftin [cefuroxime Axetil] Nausea Only   Upset stomach   Lisinopril Cough   Sulfamethoxazole-trimethoprim    Other  reaction(s): Unknown   Morphine Nausea And Vomiting   IV morphine caused N and V  After surgery.   Has tolerated Kadian in the past         Medication List     TAKE these medications    albuterol  108 (90 Base) MCG/ACT inhaler Commonly known as: VENTOLIN  HFA Inhale 2 puffs into the lungs every 6 (six) hours as needed for wheezing or shortness of breath.   aspirin  81 MG tablet Take 81 mg by mouth daily.   cetirizine 10 MG tablet Commonly known as: ZYRTEC Take 10 mg by mouth daily as needed (allergies).   chlorhexidine  0.12 % solution Commonly known as: PERIDEX  Use as directed 15 mLs in the mouth or throat at bedtime.   cholecalciferol 25 MCG (1000 UNIT) tablet Commonly known as: VITAMIN D3 Take 1,000 Units by mouth at bedtime.   diazepam  5 MG tablet Commonly known as: Valium  Take 1 tablet (5 mg total) by mouth every 6 (six) hours as needed for anxiety.   empagliflozin  25 MG Tabs tablet Commonly known as: JARDIANCE  Take 25 mg by mouth daily.   fish oil-omega-3 fatty acids  1000 MG capsule Take 2 capsules (2 g total) by mouth daily. 2-4 GRAMS DAILY What changed:  how much to take when to take this additional instructions   gabapentin  600 MG tablet Commonly known as: NEURONTIN  TAKE ONE TABLET BY MOUTH THREE TIMES DAILY. What changed: additional instructions   Ginger  Root 500 MG Caps Take 500 mg by mouth daily.   Glucosamine Sulfate 1000 MG Tabs Take 2,000  mg by mouth daily.   HYDROcodone -acetaminophen  7.5-325 MG tablet Commonly known as: Norco Take 1 tablet by mouth every 6 (six) hours as needed for moderate pain (pain score 4-6) or severe pain (pain score 7-10). For chronic pain   leflunomide  10 MG tablet Commonly known as: ARAVA  Take 1 tablet (10 mg total) by mouth daily.   metoprolol  succinate 50 MG 24 hr tablet Commonly known as: TOPROL -XL Take 1 tablet (50 mg total) by mouth daily. Take with or immediately following a meal.   multivitamin  tablet Take 1 tablet by mouth 3 (three) times a week.   nortriptyline  25 MG capsule Commonly known as: PAMELOR  TAKE (1) CAPSULE BY MOUTH AT BEDTIME.   olmesartan 40 MG tablet Commonly known as: BENICAR Take 40 mg by mouth daily.   OneTouch Delica Plus Lancet33G Misc USE TO TEST 3 TIMESUDAILY.   OneTouch Verio Flex System w/Device Kit   OneTouch Verio test strip Generic drug: glucose blood   Otezla  30 MG Tabs Generic drug: Apremilast  Take 1 tablet (30 mg total) by mouth 2 (two) times daily.   oxybutynin  5 MG 24 hr tablet Commonly known as: DITROPAN -XL Take 1 tablet (5 mg total) by mouth at bedtime.   oxyCODONE  5 MG immediate release tablet Commonly known as: Oxy IR/ROXICODONE  Take 1 tablet (5 mg total) by mouth every 3 (three) hours as needed for moderate pain (pain score 4-6).   pantoprazole  40 MG tablet Commonly known as: PROTONIX  TAKE (1) TABLET BY MOUTH TWICE DAILY.   potassium chloride  20 MEQ packet Commonly known as: KLOR-CON  Take 20 mEq by mouth daily.   predniSONE  5 MG tablet Commonly known as: DELTASONE  TAKE 1 TABLET BY MOUTH DAILY WITH BREAKFAST   Solu-CORTEF  100 MG injection Generic drug: hydrocortisone  sodium succinate  Inject 100 mg into the muscle daily as needed (when unable to keep down prednisone  tablet).   spironolactone  25 MG tablet Commonly known as: ALDACTONE  Take 25 mg by mouth daily.   SYSTANE OP Place 1 drop into both eyes 4 (four) times daily as needed (dry eyes).   Taltz  80 MG/ML pen Generic drug: ixekizumab  Inject 1 mL (80 mg total) into the skin every 28 (twenty-eight) days.   TART CHERRY ADVANCED PO Take 3,000 mg by mouth daily. daily   topiramate  200 MG tablet Commonly known as: TOPAMAX  TAKE (1) TABLET BY MOUTH ONCE AT BEDTIME.   Turmeric 500 MG Caps Take 500 mg by mouth every morning.        Follow-up Information     Artemisa Bile, MD Follow up.   Specialty: Internal Medicine Contact information: [address] [place] [address] [phone]         Garry Kansas, MD Follow up.   Specialty: Neurosurgery Why: keep your scheduled appointment Contact information: [address] [place] [address] [phone]                 Signed: Audie Bleacher 07/19/2023, 2:28 PM

## 2023-07-19 NOTE — Progress Notes (Signed)
 AVS Reviewed.  Patient excited for DC.  Does not have any needs.  All questions answered.

## 2023-07-24 ENCOUNTER — Encounter: Payer: PPO | Attending: Physical Medicine and Rehabilitation | Admitting: Physical Medicine and Rehabilitation

## 2023-07-24 ENCOUNTER — Encounter: Payer: Self-pay | Admitting: Physical Medicine and Rehabilitation

## 2023-07-24 VITALS — BP 145/58 | HR 84 | Ht <= 58 in | Wt 118.0 lb

## 2023-07-24 DIAGNOSIS — M5134 Other intervertebral disc degeneration, thoracic region: Secondary | ICD-10-CM | POA: Insufficient documentation

## 2023-07-24 DIAGNOSIS — G894 Chronic pain syndrome: Secondary | ICD-10-CM | POA: Diagnosis not present

## 2023-07-24 DIAGNOSIS — Z981 Arthrodesis status: Secondary | ICD-10-CM | POA: Diagnosis not present

## 2023-07-24 DIAGNOSIS — M48061 Spinal stenosis, lumbar region without neurogenic claudication: Secondary | ICD-10-CM | POA: Insufficient documentation

## 2023-07-24 NOTE — Progress Notes (Signed)
 Subjective:    Patient ID: Tonya Hall, female    DOB: 12/24/1950, 73 y.o.   MRN: [id number]  HPI  An audio/video tele-health visit is felt to be the most appropriate encounter for this patient at this time. This is a follow up tele-visit via phone. The patient is at home. MD is at office. Prior to scheduling this appointment, our staff discussed the limitations of evaluation and management by telemedicine and the availability of in-person appointments. The patient expressed understanding and agreed to proceed.   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  L hip bursitis and also has SI joint pain more on L- as well as mid thoracic acute on chronic pain.   Here for f/u on chronic pain from back and psoriatic arthritis.    Had thoracic laminectomy last Thursday-  Still in a lot of pain Got meds from [facility]- Oxycodone  5mg - only took 10 mg x1- made her loopy and didn't help Valium - taking rarely- but doesn't help a lot.  Taking Oxy5 mg 4x/day.   Not taking the meds I gave her.  A little better today, first day a little bit better- finally had some improvement.   Cannot sit because it causes so much pain- packing pillows to get comfortable- not working very well- cannot stay in bed all the time. Using neck pillow more which has helped - it goes around incision.    No other issues     Pain Inventory Average Pain 10 Pain Right Now 8 My pain is constant and dull  In the last 24 hours, has pain interfered with the following? General activity 9 Relation with others 4 Enjoyment of life 10 What TIME of day is your pain at its worst? morning , daytime, evening, and night Sleep (in general) Good  Pain is worse with: walking, bending, sitting, standing, and some activites Pain improves with: rest and  medication Relief from Meds: 1  Family History  Problem Relation Age of Onset   Diabetes Mother    Pancreatitis Mother    Arthritis Mother        psoriatic    Fibromyalgia Mother    Rheumatic fever Father    Diabetes Other        grandparents   Skin cancer Other        grandfather   Hypertension Other        grandparent   Congestive Heart Failure Other        grandfather   Parkinson's disease Other        grandmother   Colon cancer Maternal Uncle    Fibromyalgia Sister    Rheum arthritis Sister    Diabetes Sister    Fibromyalgia Sister    Diabetes Sister    Rheum arthritis Sister    Hypertension Son    Colon polyps Neg Hx    Social History   Socioeconomic History   Marital status: Married    Spouse name: Not on file   Number of children: 2   Years of education: Not on file   Highest education level: Not on file  Occupational History   Occupation: Case Midwife    Employer: [employer]    Comment: Estate manager/land agent  Tobacco Use   Smoking status: Never  Passive exposure: Never   Smokeless tobacco: Never  Vaping Use   Vaping status: Never Used  Substance and Sexual Activity   Alcohol  use: No   Drug use: No   Sexual activity: Not Currently    Partners: Male    Birth control/protection: Surgical    Comment: BTL  Other Topics Concern   Not on file  Social History Narrative   ATTENDS [employer]. 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   Social Drivers of Corporate investment banker Strain: Not on file  Food Insecurity: No Food Insecurity (07/18/2023)   Hunger Vital Sign    Worried About Running Out of Food in the Last Year: Never true    Ran Out of Food in the Last Year: Never true  Transportation Needs: No Transportation Needs (07/18/2023)   PRAPARE - Administrator, Civil Service (Medical): No    Lack of Transportation (Non-Medical): No  Physical Activity: Not on file  Stress: Not on file  Social Connections: Socially Integrated (07/18/2023)    Social Connection and Isolation Panel [NHANES]    Frequency of Communication with Friends and Family: More than three times a week    Frequency of Social Gatherings with Friends and Family: Three times a week    Attends Religious Services: More than 4 times per year    Active Member of Clubs or Organizations: Yes    Attends Banker Meetings: More than 4 times per year    Marital Status: Married   Past Surgical History:  Procedure Laterality Date   ANTERIOR CERVICAL DECOMP/DISCECTOMY FUSION N/A 03/09/2020   Procedure: ANTERIOR CERVICAL DECOMPRESSION/DISCECTOMY FUSION, INTERBODY PROSTHESIS, PLATE SCREWS CERVICAL FIVE-SIX, CERVICAL SIX-SEVEN;  Surgeon: Garry Kansas, MD;  Location: [facility] OR;  Service: Neurosurgery;  Laterality: N/A;   BACK SURGERY  2003   BALLOON DILATION N/A 11/12/2022   Procedure: BALLOON DILATION;  Surgeon: Vinetta Greening, DO;  [facility];  Service: Endoscopy;  Laterality: N/A;   BIOPSY  04/17/2016   Procedure: BIOPSY;  Surgeon: Alyce Jubilee, MD;  [facility]: [facility];  Service: Endoscopy;;  random colon bx's   BIOPSY  11/12/2022   Procedure: BIOPSY;  Surgeon: Vinetta Greening, DO;  [facility];  Service: Endoscopy;;   CERVICAL SPINE SURGERY  09/09/2017   CESAREAN SECTION  1974, 1978       CHOLECYSTECTOMY  04/2023   COLONOSCOPY  06/19/2008   VZD:[id number] internal hemorrhoids/mild sigmoin colon diverticulosis   COLONOSCOPY  2012   Dr. Riley Cheadle: normal rectum, diverticula, lymphocytic colitis    COLONOSCOPY WITH PROPOFOL  N/A 04/17/2016   Dr. Nolene Baumgarten: Normal terminal ileum, internal/external hemorrhoids, random colon biopsies consistent with collagenous colitis   DILATION AND CURETTAGE OF UTERUS  1977   spontaneous abortion   ESOPHAGOGASTRODUODENOSCOPY  06/19/2008   [id number] gastritis   ESOPHAGOGASTRODUODENOSCOPY (EGD) WITH PROPOFOL  N/A 11/12/2022   Procedure: ESOPHAGOGASTRODUODENOSCOPY (EGD) WITH PROPOFOL ;  Surgeon: Vinetta Greening, DO;  [facility];  Service: Endoscopy;  Laterality: N/A;  8:30 am, asa 3   LUMBAR DISC ARTHROPLASTY  09/1996   LUMBAR FUSION  6/03, 11/08, 11/14   L4-5 fusion, L2-3 fusion, L1-2   LUMBAR FUSION  08/03/2020   L4-L5   LUMBAR LAMINECTOMY  03/2020   MOUTH SURGERY  12/07/2021   oral surgery   ODONTOID SCREW INSERTION N/A 01/27/2018   Procedure: ODONTOID SCREW INSERTION;  Surgeon: Garry Kansas, MD;  Location: [facility] OR;  Service: Neurosurgery;  Laterality: N/A;  ODONTOID SCREW  INSERTION   THORACIC DISCECTOMY N/A 07/18/2023   Procedure: THORACIC DISCECTOMY THORACIC ELEVEN-TWELVE DISCECTOMY;  Surgeon: Garry Kansas, MD;  Location: [facility] OR;  Service: Neurosurgery;  Laterality: N/A;  T11-T   TONSILLECTOMY     TONSILLECTOMY AND ADENOIDECTOMY     TUBAL LIGATION     Past Surgical History:  Procedure Laterality Date   ANTERIOR CERVICAL DECOMP/DISCECTOMY FUSION N/A 03/09/2020   Procedure: ANTERIOR CERVICAL DECOMPRESSION/DISCECTOMY FUSION, INTERBODY PROSTHESIS, PLATE SCREWS CERVICAL FIVE-SIX, CERVICAL SIX-SEVEN;  Surgeon: Garry Kansas, MD;  Location: [facility] OR;  Service: Neurosurgery;  Laterality: N/A;   BACK SURGERY  2003   BALLOON DILATION N/A 11/12/2022   Procedure: BALLOON DILATION;  Surgeon: Vinetta Greening, DO;  [facility];  Service: Endoscopy;  Laterality: N/A;   BIOPSY  04/17/2016   Procedure: BIOPSY;  Surgeon: Alyce Jubilee, MD;  [facility];  Service: Endoscopy;;  random colon bx's   BIOPSY  11/12/2022   Procedure: BIOPSY;  Surgeon: Vinetta Greening, DO;  [facility];  Service: Endoscopy;;   CERVICAL SPINE SURGERY  09/09/2017   CESAREAN SECTION  1974, 1978       CHOLECYSTECTOMY  04/2023   COLONOSCOPY  06/19/2008   NIO:EVOJJ internal hemorrhoids/mild sigmoin colon diverticulosis   COLONOSCOPY  2012   Dr. Riley Cheadle: normal rectum, diverticula, lymphocytic colitis    COLONOSCOPY WITH PROPOFOL  N/A 04/17/2016   Dr. Nolene Baumgarten: Normal  terminal ileum, internal/external hemorrhoids, random colon biopsies consistent with collagenous colitis   DILATION AND CURETTAGE OF UTERUS  1977   spontaneous abortion   ESOPHAGOGASTRODUODENOSCOPY  06/19/2008   [id number] gastritis   ESOPHAGOGASTRODUODENOSCOPY (EGD) WITH PROPOFOL  N/A 11/12/2022   Procedure: ESOPHAGOGASTRODUODENOSCOPY (EGD) WITH PROPOFOL ;  Surgeon: Vinetta Greening, DO;  [facility];  Service: Endoscopy;  Laterality: N/A;  8:30 am, asa 3   LUMBAR DISC ARTHROPLASTY  09/1996   LUMBAR FUSION  6/03, 11/08, 11/14   L4-5 fusion, L2-3 fusion, L1-2   LUMBAR FUSION  08/03/2020   L4-L5   LUMBAR LAMINECTOMY  03/2020   MOUTH SURGERY  12/07/2021   oral surgery   ODONTOID SCREW INSERTION N/A 01/27/2018   Procedure: ODONTOID SCREW INSERTION;  Surgeon: Garry Kansas, MD;  Location: [facility] OR;  Service: Neurosurgery;  Laterality: N/A;  ODONTOID SCREW INSERTION   THORACIC DISCECTOMY N/A 07/18/2023   Procedure: THORACIC DISCECTOMY THORACIC ELEVEN-TWELVE DISCECTOMY;  Surgeon: Garry Kansas, MD;  Location: [facility] OR;  Service: Neurosurgery;  Laterality: N/A;  T11-T   TONSILLECTOMY     TONSILLECTOMY AND ADENOIDECTOMY     TUBAL LIGATION     Past Medical History:  Diagnosis Date   Asthma    Albuterol  in haler prn   Cataract    immature unsure which eye   Chronic back pain    COVID-19 October/November 2020   Degenerative disk disease    psoriatic   Diabetes mellitus without complication (HCC)    diet controlled   Elevated liver enzymes    r/t leflunamide (this medication is put on hold intermittently due to this)   Esophageal motility disorder    Non-specific, see modified barium study/speech path, BP   GERD (gastroesophageal reflux disease)    takes Protonix  bid   History of blood transfusion    post c-section   History of bronchitis    History of hiatal hernia    HTN (hypertension)    Hyperlipidemia    Lymphocytic colitis 05/26/2010   Responded to Entocort x 3  MOS   Neuropathy  Nonallergic rhinitis    Osteoporosis    gets Prolia  every 6 months   Peripheral edema    Pneumonia 02/2019   Psoriatic arthritis (HCC)    Dr. Annett Killer   Psoriatic arthritis [facility])    bilateral hands   Raynaud's disease    Seasonal allergies    Shingles 2020   SUI (stress urinary incontinence, female)    Urinary urgency    BP (!) 145/58   Pulse 84   Ht 4\' 10"  (1.473 m)   Wt 118 lb (53.5 kg)   LMP 02/01/1999   BMI 24.66 kg/m   Opioid Risk Score:   Fall Risk Score:  `1  Depression screen [facility] 2/9     07/24/2023    9:30 AM 05/21/2023    9:11 AM 03/20/2023    9:50 AM 02/20/2023    1:05 PM 12/13/2022   10:04 AM 09/19/2022   10:21 AM 06/15/2022    9:42 AM  Depression screen PHQ 2/9  Decreased Interest 0 0 0 0 0 0 0  Down, Depressed, Hopeless 0 0 0 0 0 0 0  PHQ - 2 Score 0 0 0 0 0 0 0  Altered sleeping       0  Tired, decreased energy       2  Change in appetite       0  Feeling bad or failure about yourself        0  Trouble concentrating       0  Moving slowly or fidgety/restless       1  Suicidal thoughts       0  PHQ-9 Score       3  Difficult doing work/chores       Very difficult    Review of Systems  Gastrointestinal:  Positive for constipation.  Musculoskeletal:  Positive for back pain.  All other systems reviewed and are negative.      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  L hip bursitis and also has SI joint pain more on L- as well as mid thoracic acute on chronic pain.   Here for f/u on chronic pain from back and psoriatic arthritis.     Surgery last Thursday by Dr Dr Larrie Po- 4/17-   2. Once done with Dr Larrie Po meds, can pick up Norco 7.5 mg when needs it.    3. Con't gabapentin - 600  mg 3x/day- last refill 4/17.    4. Has Valium   for muscle spasms as needed.    5. Try salonpas patches- - on each side of incision- might help superficial pain. Don't touch incision! 1/2 inch away on either side. Most of pain is incisional.    6.  F/U- 2 months with Emilia Harbour and 4 months with me-

## 2023-07-24 NOTE — Patient Instructions (Signed)
  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  L hip bursitis and also has SI joint pain more on L- as well as mid thoracic acute on chronic pain.   Here for f/u on chronic pain from back and psoriatic arthritis.     Surgery last Thursday by Dr Dr Larrie Po- 4/17-   2. Once done with Dr Larrie Po meds, can pick up Norco 7.5 mg when needs it.    3. Con't gabapentin - 600 mg 3x/day- last refill 4/17.    4. Has Valium   for muscle spasms as needed.    5. Try salonpas patches- - on each side of incision- might help superficial pain. Don't touch incision! 1/2 inch away on either side. Most of pain is incisional.    6.  F/U- 2 months with Emilia Harbour and 4 months with me-

## 2023-07-28 ENCOUNTER — Other Ambulatory Visit: Payer: Self-pay | Admitting: Rheumatology

## 2023-07-28 DIAGNOSIS — Z79899 Other long term (current) drug therapy: Secondary | ICD-10-CM

## 2023-07-28 DIAGNOSIS — L4050 Arthropathic psoriasis, unspecified: Secondary | ICD-10-CM

## 2023-07-28 DIAGNOSIS — L409 Psoriasis, unspecified: Secondary | ICD-10-CM

## 2023-07-29 NOTE — Telephone Encounter (Signed)
 Last Fill: 04/12/2023  Labs: 07/08/2023 CO2 21, Glucose 139, BUN 38, Creat. 1.02, GFR 58, Total Protein 6.4, WBC 12.3  TB Gold: 09/20/2022 Neg    Next Visit: 10/30/2023  Last Visit: 05/15/2023  [id number] arthritis   Current Dose per office note 05/15/2023: Taltz  80 mg sq injections q 4 weeks   Okay to refill Taltz ?

## 2023-08-06 ENCOUNTER — Telehealth: Payer: Self-pay | Admitting: *Deleted

## 2023-08-06 NOTE — Telephone Encounter (Signed)
 Tonya Hall called for a refill on her hydrocodone . Shehas completed the meds from Dr Benedetta Bradley.

## 2023-08-07 MED ORDER — HYDROCODONE-ACETAMINOPHEN 7.5-325 MG PO TABS: 1.0000 | ORAL_TABLET | Freq: Four times a day (QID) | ORAL | 0 refills | Status: DC | PRN

## 2023-08-08 ENCOUNTER — Emergency Department (HOSPITAL_COMMUNITY)

## 2023-08-08 ENCOUNTER — Other Ambulatory Visit: Payer: Self-pay

## 2023-08-08 ENCOUNTER — Emergency Department (HOSPITAL_COMMUNITY)
Admission: EM | Admit: 2023-08-08 | Discharge: 2023-08-08 | Disposition: A | Discharge status: Home / Self Care | Attending: Emergency Medicine | Admitting: Emergency Medicine

## 2023-08-08 ENCOUNTER — Encounter (HOSPITAL_COMMUNITY): Payer: Self-pay

## 2023-08-08 DIAGNOSIS — S32502A Unspecified fracture of left pubis, initial encounter for closed fracture: Secondary | ICD-10-CM | POA: Diagnosis not present

## 2023-08-08 DIAGNOSIS — Z7982 Long term (current) use of aspirin: Secondary | ICD-10-CM | POA: Diagnosis not present

## 2023-08-08 DIAGNOSIS — R112 Nausea with vomiting, unspecified: Secondary | ICD-10-CM | POA: Diagnosis not present

## 2023-08-08 DIAGNOSIS — R1031 Right lower quadrant pain: Secondary | ICD-10-CM | POA: Insufficient documentation

## 2023-08-08 DIAGNOSIS — K449 Diaphragmatic hernia without obstruction or gangrene: Secondary | ICD-10-CM | POA: Diagnosis not present

## 2023-08-08 DIAGNOSIS — K5730 Diverticulosis of large intestine without perforation or abscess without bleeding: Secondary | ICD-10-CM | POA: Diagnosis not present

## 2023-08-08 LAB — URINALYSIS, ROUTINE W REFLEX MICROSCOPIC
Bilirubin Urine: NEGATIVE
Glucose, UA: 150 mg/dL — AB
Hgb urine dipstick: NEGATIVE
Ketones, ur: 20 mg/dL — AB
Leukocytes,Ua: NEGATIVE
Nitrite: NEGATIVE
Protein, ur: 30 mg/dL — AB
Specific Gravity, Urine: >1.046 — ABNORMAL HIGH (ref 1.005–1.030)
pH: 5 (ref 5.0–8.0)

## 2023-08-08 LAB — COMPREHENSIVE METABOLIC PANEL WITH GFR
ALT: 29 U/L (ref 0–44)
AST: 23 U/L (ref 15–41)
Albumin: 4 g/dL (ref 3.5–5.0)
Alkaline Phosphatase: 75 U/L (ref 38–126)
Anion gap: 10 (ref 5–15)
BUN: 21 mg/dL (ref 8–23)
CO2: 20 mmol/L — ABNORMAL LOW (ref 22–32)
Calcium: 9.4 mg/dL (ref 8.9–10.3)
Chloride: 102 mmol/L (ref 98–111)
Creatinine, Ser: 0.88 mg/dL (ref 0.44–1.00)
GFR, Estimated: >60 mL/min (ref >60)
Glucose, Bld: 196 mg/dL — ABNORMAL HIGH (ref 70–99)
Potassium: 3.7 mmol/L (ref 3.5–5.1)
Sodium: 132 mmol/L — ABNORMAL LOW (ref 135–145)
Total Bilirubin: 0.7 mg/dL (ref 0.0–1.2)
Total Protein: 7.4 g/dL (ref 6.5–8.1)

## 2023-08-08 LAB — CBC
HCT: 43.4 % (ref 36.0–46.0)
Hemoglobin: 14.7 g/dL (ref 12.0–15.0)
MCH: 31.4 pg (ref 26.0–34.0)
MCHC: 33.9 g/dL (ref 30.0–36.0)
MCV: 92.7 fL (ref 80.0–100.0)
Platelets: 381 10*3/uL (ref 150–400)
RBC: 4.68 MIL/uL (ref 3.87–5.11)
RDW: 13.1 % (ref 11.5–15.5)
WBC: 10.7 10*3/uL — ABNORMAL HIGH (ref 4.0–10.5)
nRBC: 0 % (ref 0.0–0.2)

## 2023-08-08 LAB — LIPASE, BLOOD: Lipase: 47 U/L (ref 11–51)

## 2023-08-08 MED ORDER — ONDANSETRON 4 MG PO TBDP: 4.0000 mg | ORAL_TABLET | Freq: Three times a day (TID) | ORAL | 0 refills | Status: DC | PRN

## 2023-08-08 MED ORDER — IOHEXOL 300 MG/ML  SOLN
100.0000 mL | Freq: Once | INTRAMUSCULAR | Status: AC | PRN
  Administered 2023-08-08: 100 mL via INTRAVENOUS

## 2023-08-08 MED ORDER — FENTANYL CITRATE PF 50 MCG/ML IJ SOSY
50.0000 ug | PREFILLED_SYRINGE | Freq: Once | INTRAMUSCULAR | Status: AC
  Administered 2023-08-08: 50 ug via INTRAVENOUS
  Filled 2023-08-08: qty 1

## 2023-08-08 MED ORDER — ONDANSETRON HCL 4 MG/2ML IJ SOLN
4.0000 mg | Freq: Once | INTRAMUSCULAR | Status: AC
  Administered 2023-08-08: 4 mg via INTRAVENOUS
  Filled 2023-08-08: qty 2

## 2023-08-08 MED ORDER — SODIUM CHLORIDE 0.9 % IV BOLUS
1000.0000 mL | Freq: Once | INTRAVENOUS | Status: AC
  Administered 2023-08-08: 1000 mL via INTRAVENOUS

## 2023-08-08 NOTE — ED Triage Notes (Signed)
 Pt arrived via POV c/o RLQ abdominal pain since Monday. Pt reports she takes pain medication chronically and does not think she is constipated. Pt endorses Nausea.

## 2023-08-08 NOTE — Discharge Instructions (Signed)
 Thankfully your testing did not reveal any signs of infection, appendicitis or any other surgical findings.  You have been dehydrated for which we gave you IV fluids.  You can use Zofran  at home for nausea, you can take your home medications for all of your chronic medical conditions including your pain conditions as needed as well.  Please return to the ER for severe worsening symptoms otherwise see your doctor in 3 days for recheck

## 2023-08-08 NOTE — ED Provider Notes (Signed)
 [place] EMERGENCY DEPARTMENT AT [facility] Provider Note   CSN: [id number] Arrival date & time: 08/08/23  1321     History  Chief Complaint  Patient presents with   Abdominal Pain    Tonya Hall is a 73 y.o. female.   Abdominal Pain    This patient is a 73 year old female status post cholecystectomy earlier this year, history of a couple of cesarean sections earlier in life and a history of recurrent back surgery including a recent back surgery several weeks ago which was a thoracic laminectomy.  This is all per the patient's report.  She comes in today with nausea and vomiting which has been recurrent this week with associated right lower quadrant abdominal pain.  She reports that this pain has been intense today which is what brought her in, she states that some days are good and asymptomatic and some days are bad.  She had been on oxycodone  in addition to her baseline hydrocodone  which she takes chronically after the surgery but no longer takes the oxycodone .  She was taking stool softeners and endorses having regular soft stools including 1 today which was more loose than usual but nonbloody.  There has been no fevers or chills, pain is located in the right lower quadrant, no other complaints  Home Medications Prior to Admission medications   Medication Sig Start Date End Date Taking? Authorizing Provider  ondansetron  (ZOFRAN -ODT) 4 MG disintegrating tablet Take 1 tablet (4 mg total) by mouth every 8 (eight) hours as needed for nausea. 08/08/23  Yes Early Glisson, MD  albuterol  (PROVENTIL  HFA;VENTOLIN  HFA) 108 (90 BASE) MCG/ACT inhaler Inhale 2 puffs into the lungs every 6 (six) hours as needed for wheezing or shortness of breath.     [provider]  aspirin  81 MG tablet Take 81 mg by mouth daily.    [provider]  Blood Glucose Monitoring Suppl (ONETOUCH VERIO FLEX SYSTEM) w/Device KIT  05/15/22   [provider]  cetirizine (ZYRTEC) 10  MG tablet Take 10 mg by mouth daily as needed (allergies).    [provider]  chlorhexidine  (PERIDEX ) 0.12 % solution Use as directed 15 mLs in the mouth or throat at bedtime.    [provider]  cholecalciferol (VITAMIN D3) 25 MCG (1000 UNIT) tablet Take 1,000 Units by mouth at bedtime.    [provider]  diazepam  (VALIUM ) 5 MG tablet Take 1 tablet (5 mg total) by mouth every 6 (six) hours as needed for anxiety. 07/19/23   Audie Bleacher, MD  empagliflozin  (JARDIANCE ) 25 MG TABS tablet Take 25 mg by mouth daily.    [provider]  fish oil-omega-3 fatty acids  1000 MG capsule Take 2 capsules (2 g total) by mouth daily. 2-4 GRAMS DAILY Patient taking differently: Take 1 g by mouth 2 (two) times daily. 03/29/22   Hilty, Aviva Lemmings, MD  gabapentin  (NEURONTIN ) 600 MG tablet TAKE ONE TABLET BY MOUTH THREE TIMES DAILY. 07/18/23   Bea Lime, DO  Ginger , Zingiber officinalis, (GINGER  ROOT) 500 MG CAPS Take 500 mg by mouth daily.    [provider]  Glucosamine Sulfate 1000 MG TABS Take 2,000 mg by mouth daily. 07/03/11   Anibal Kent, MD  HYDROcodone -acetaminophen  (NORCO) 7.5-325 MG tablet Take 1 tablet by mouth every 6 (six) hours as needed for moderate pain (pain score 4-6) or severe pain (pain score 7-10). For chronic pain 08/07/23   Lovorn, Megan, MD  hydrocortisone  sodium succinate  (SOLU-CORTEF ) 100  MG SOLR injection Inject 100 mg into the muscle daily as needed (when unable to keep down prednisone  tablet).     [provider]  Lancets (ONETOUCH DELICA PLUS LANCET33G) MISC USE TO TEST 3 TIMESUDAILY. 07/01/19   [provider]  leflunomide  (ARAVA ) 10 MG tablet Take 1 tablet (10 mg total) by mouth daily. 07/02/23   Romayne Clubs, PA-C  metoprolol  succinate (TOPROL -XL) 50 MG 24 hr tablet Take 1 tablet (50 mg total) by mouth daily. Take with or immediately following a meal. 07/03/11   Hensel, Azucena Bollard, MD  Misc Natural Products (TART CHERRY  ADVANCED PO) Take 3,000 mg by mouth daily. daily    [provider]  Multiple Vitamin (MULTIVITAMIN) tablet Take 1 tablet by mouth 3 (three) times a week.    [provider]  nortriptyline  (PAMELOR ) 25 MG capsule TAKE (1) CAPSULE BY MOUTH AT BEDTIME. 12/05/17   Romayne Clubs, PA-C  olmesartan (BENICAR) 40 MG tablet Take 40 mg by mouth daily.    [provider]  [facility] VERIO test strip  03/05/18   [provider]  OTEZLA  30 MG TABS Take 1 tablet (30 mg total) by mouth 2 (two) times daily. 07/10/23   Nicholas Bari, MD  oxybutynin  (DITROPAN -XL) 5 MG 24 hr tablet Take 1 tablet (5 mg total) by mouth at bedtime. 12/13/22   Lillian Rein, MD  oxyCODONE  (OXY IR/ROXICODONE ) 5 MG immediate release tablet Take 1 tablet (5 mg total) by mouth every 3 (three) hours as needed for moderate pain (pain score 4-6). 07/19/23   Audie Bleacher, MD  pantoprazole  (PROTONIX ) 40 MG tablet TAKE (1) TABLET BY MOUTH TWICE DAILY. 02/01/23   Lanney Pitts, PA-C  Polyethyl Glycol-Propyl Glycol (SYSTANE OP) Place 1 drop into both eyes 4 (four) times daily as needed (dry eyes).    [provider]  potassium chloride  (KLOR-CON ) 20 MEQ packet Take 20 mEq by mouth daily.    [provider]  predniSONE  (DELTASONE ) 5 MG tablet TAKE 1 TABLET BY MOUTH DAILY WITH BREAKFAST 07/10/23   Nicholas Bari, MD  spironolactone  (ALDACTONE ) 25 MG tablet Take 25 mg by mouth daily.    [provider]  TALTZ  80 MG/ML pen INJECT 80MG  (1 PEN) UNDER THE SKIN EVERY 28 DAYS 07/29/23   Romayne Clubs, PA-C  topiramate  (TOPAMAX ) 200 MG tablet TAKE (1) TABLET BY MOUTH ONCE AT BEDTIME. 07/02/23   Romayne Clubs, PA-C  Turmeric 500 MG CAPS Take 500 mg by mouth every morning.    [provider]      Allergies    Adalimumab, Imuran [azathioprine sodium], Methotrexate, Plaquenil [hydroxychloroquine sulfate], Sulfonamide derivatives, Ceftin [cefuroxime axetil], Lisinopril,  Sulfamethoxazole-trimethoprim, and Morphine    Review of Systems   Review of Systems  Gastrointestinal:  Positive for abdominal pain.  All other systems reviewed and are negative.   Physical Exam Updated Vital Signs BP (!) 163/78   Pulse 84   Temp 98.1 F (36.7 C) (Oral)   Resp 17   Ht 1.473 m (4\' 10" )   Wt 53.5 kg   LMP 02/01/1999   SpO2 98%   BMI 24.65 kg/m  Physical Exam Vitals and nursing note reviewed.  Constitutional:      General: She is not in acute distress.    Appearance: She is well-developed.  HENT:     Head: Normocephalic and atraumatic.     Mouth/Throat:     Pharynx: No oropharyngeal exudate.  Eyes:  General: No scleral icterus.       Right eye: No discharge.        Left eye: No discharge.     Conjunctiva/sclera: Conjunctivae normal.     Pupils: Pupils are equal, round, and reactive to light.  Neck:     Thyroid : No thyromegaly.     Vascular: No JVD.  Cardiovascular:     Rate and Rhythm: Normal rate and regular rhythm.     Heart sounds: Normal heart sounds. No murmur heard.    No friction rub. No gallop.  Pulmonary:     Effort: Pulmonary effort is normal. No respiratory distress.     Breath sounds: Normal breath sounds. No wheezing or rales.  Abdominal:     General: Bowel sounds are normal. There is no distension.     Palpations: Abdomen is soft. There is no mass.     Tenderness: There is abdominal tenderness.     Comments: Right lower quadrant tenderness, no other abdominal tenderness, no guarding or peritoneal signs  Musculoskeletal:        General: No tenderness. Normal range of motion.     Cervical back: Normal range of motion and neck supple.     Right lower leg: No edema.     Left lower leg: No edema.  Lymphadenopathy:     Cervical: No cervical adenopathy.  Skin:    General: Skin is warm and dry.     Findings: No erythema or rash.     Comments: Surgical sites in the thoracic region evaluated, they are clean dry and intact without  redness or tenderness  Neurological:     General: No focal deficit present.     Mental Status: She is alert.     Coordination: Coordination normal.  Psychiatric:        Behavior: Behavior normal.     ED Results / Procedures / Treatments   Labs (all labs ordered are listed, but only abnormal results are displayed) Labs Reviewed  COMPREHENSIVE METABOLIC PANEL WITH GFR - Abnormal; Notable for the following components:      Result Value   Sodium 132 (*)    CO2 20 (*)    Glucose, Bld 196 (*)    All other components within normal limits  CBC - Abnormal; Notable for the following components:   WBC 10.7 (*)    All other components within normal limits  URINALYSIS, ROUTINE W REFLEX MICROSCOPIC - Abnormal; Notable for the following components:   Specific Gravity, Urine >1.046 (*)    Glucose, UA 150 (*)    Ketones, ur 20 (*)    Protein, ur 30 (*)    Bacteria, UA RARE (*)    All other components within normal limits  LIPASE, BLOOD    EKG None  Radiology CT ABDOMEN PELVIS W CONTRAST Result Date: 08/08/2023 CLINICAL DATA:  Right lower quadrant abdominal pain EXAM: CT ABDOMEN AND PELVIS WITH CONTRAST TECHNIQUE: Multidetector CT imaging of the abdomen and pelvis was performed using the standard protocol following bolus administration of intravenous contrast. RADIATION DOSE REDUCTION: This exam was performed according to the departmental dose-optimization program which includes automated exposure control, adjustment of the mA and/or kV according to patient size and/or use of iterative reconstruction technique. CONTRAST:  OMNIPAQUE  IOHEXOL  300 MG/ML  SOLN COMPARISON:  12/21/2020 FINDINGS: Lower chest: Scarring medially in the left lower lobe. Descending thoracic aortic atherosclerosis. Small to moderate-sized hiatal hernia. Hepatobiliary: Cholecystectomy.  Otherwise unremarkable. Pancreas: Unremarkable Spleen: Unremarkable Adrenals/Urinary Tract: No significant  findings Stomach/Bowel:  Scattered colonic diverticula. Mildly mobile cecum in the right upper abdomen. No dilated bowel. Normal appendix. Vascular/Lymphatic: Atherosclerosis is present, including aortoiliac atherosclerotic disease. Reproductive: Calcified fundal fibroid. Other: No supplemental non-categorized findings. Musculoskeletal: Late phase healing of a left lower rib fracture. Postoperative findings in the lower thoracic spine. Postoperative findings in the lumbar spine. Progressive healing fractures of the left pubic rami. Nonspecific stable band of sclerosis in the left iliac bone example on image 70 of series 4. IMPRESSION: 1. No specific abnormality is identified to explain the patient's right lower quadrant pain. 2. Small to moderate-sized hiatal hernia. 3. Scattered colonic diverticula. 4. Progressive healing fractures of the left pubic rami. 5. Late phase healing of a left lower rib fracture. 6.  Aortic Atherosclerosis (ICD10-I70.0). Electronically Signed   By: Freida Jes M.D.   On: 08/08/2023 18:48    Procedures Procedures    Medications Ordered in ED Medications  fentaNYL  (SUBLIMAZE ) injection 50 mcg (50 mcg Intravenous Given 08/08/23 1703)  iohexol  (OMNIPAQUE ) 300 MG/ML solution 100 mL (100 mLs Intravenous Contrast Given 08/08/23 1758)  ondansetron  (ZOFRAN ) injection 4 mg (4 mg Intravenous Given 08/08/23 2027)  fentaNYL  (SUBLIMAZE ) injection 50 mcg (50 mcg Intravenous Given 08/08/23 2124)  sodium chloride  0.9 % bolus 1,000 mL (1,000 mLs Intravenous New Bag/Given 08/08/23 2123)    ED Course/ Medical Decision Making/ A&P                                 Medical Decision Making Amount and/or Complexity of Data Reviewed Labs: ordered. Radiology: ordered.  Risk Prescription drug management.    This patient presents to the ED for concern of abdominal pain differential diagnosis includes constipation, appendicitis, abscess, perforation, UTI, cystitis    Additional history obtained:  Additional  history obtained from medical record External records from outside source obtained and reviewed including prior to: Evaluations   Lab Tests:  I Ordered, and personally interpreted labs.  The pertinent results include: Urinalysis with high specific gravity in the presence of ketones, no signs of infection.  Lipase normal, metabolic panel unremarkable, CBC with no significant leukocytosis or anemia   Imaging Studies ordered:  I ordered imaging studies including CT scan of the abdomen and pelvis I independently visualized and interpreted imaging which showed healing fractures of her pelvis, no other specific abnormalities causing abdominal pain I agree with the radiologist interpretation   Medicines ordered and prescription drug management:  I ordered medication including fentanyl  and IV fluids for pain and dehydration Reevaluation of the patient after these medicines showed that the patient proved I have reviewed the patients home medicines and have made adjustments as needed   Problem List / ED Course:  Patient informed of all of the results, she is agreeable to the plan, she wants to be discharged home to follow-up in the outpatient setting which I think is very reasonable.  Vital signs been reassuring and stable   Social Determinants of Health:  None           Final Clinical Impression(s) / ED Diagnoses Final diagnoses:  Right lower quadrant abdominal pain    Rx / DC Orders ED Discharge Orders          Ordered    ondansetron  (ZOFRAN -ODT) 4 MG disintegrating tablet  Every 8 hours PRN        08/08/23 2130  Early Glisson, MD 08/08/23 2132

## 2023-08-08 NOTE — ED Notes (Signed)
 Reviewed discharge instructions with patient and family. Verbalized understanding. Still has approximately 600 cc of fluids to infuse. Will continue to monitor and D/C after fluids are in.

## 2023-08-09 DIAGNOSIS — R1010 Upper abdominal pain, unspecified: Secondary | ICD-10-CM

## 2023-08-09 DIAGNOSIS — R112 Nausea with vomiting, unspecified: Secondary | ICD-10-CM

## 2023-08-09 NOTE — Telephone Encounter (Signed)
 Reviewed last LFTs in the past four weeks, now normal.

## 2023-08-12 NOTE — Telephone Encounter (Signed)
 Dr. Mordechai April only has urgent and new patient slots available next week. Are you going to get approval from him when you talk to him to use one of those slots? He has made it clear that it has to be approved by him first.

## 2023-08-12 NOTE — Telephone Encounter (Signed)
 Tammy, Please schedule patient. I prefer with Dr. Mordechai April if she is willing, see mychart message sent.

## 2023-08-14 NOTE — Addendum Note (Signed)
 Addended by: Lanney Pitts on: 08/14/2023 11:40 AM   Modules accepted: Orders

## 2023-08-19 DIAGNOSIS — R1010 Upper abdominal pain, unspecified: Secondary | ICD-10-CM | POA: Diagnosis not present

## 2023-08-19 DIAGNOSIS — R112 Nausea with vomiting, unspecified: Secondary | ICD-10-CM | POA: Diagnosis not present

## 2023-08-21 ENCOUNTER — Ambulatory Visit (INDEPENDENT_AMBULATORY_CARE_PROVIDER_SITE_OTHER): Admitting: Internal Medicine

## 2023-08-21 ENCOUNTER — Ambulatory Visit: Payer: PPO

## 2023-08-21 ENCOUNTER — Ambulatory Visit: Payer: Self-pay | Admitting: Gastroenterology

## 2023-08-21 ENCOUNTER — Encounter: Payer: Self-pay | Admitting: Internal Medicine

## 2023-08-21 VITALS — BP 133/72 | HR 78 | Temp 97.8°F | Ht <= 58 in | Wt 120.6 lb

## 2023-08-21 DIAGNOSIS — R1010 Upper abdominal pain, unspecified: Secondary | ICD-10-CM | POA: Diagnosis not present

## 2023-08-21 DIAGNOSIS — R112 Nausea with vomiting, unspecified: Secondary | ICD-10-CM | POA: Diagnosis not present

## 2023-08-21 DIAGNOSIS — K219 Gastro-esophageal reflux disease without esophagitis: Secondary | ICD-10-CM

## 2023-08-21 LAB — ALPHA-GAL PANEL
Allergen Lamb IgE: <0.1 kU/L
Beef IgE: <0.1 kU/L
IgE (Immunoglobulin E), Serum: 44 [IU]/mL (ref 6–495)
O215-IgE Alpha-Gal: <0.1 kU/L
Pork IgE: <0.1 kU/L

## 2023-08-21 NOTE — Patient Instructions (Signed)
 You may have a condition called cyclic vomiting syndrome.  Would recommend you start taking coenzyme Q 10 200 mg twice daily.  Would also recommend you take L-carnitine 1000 mg twice daily.  You can order these online or pick them up at the pharmacy.  Follow-up in 6 to 8 weeks.  It was very nice seeing you again today.  Dr. Mordechai April

## 2023-08-21 NOTE — Progress Notes (Signed)
 Referring Provider: Artemisa Bile, MD Primary Care Physician:  Artemisa Bile, MD Primary GI:  Dr. Mordechai April  Chief Complaint  Patient presents with   Abdominal Pain    Patient here today due to having epigastric pain and vomiting. Patient had a recent ed visit on 08/08/2023 due to RLQ pain. Negative for Alpha gal. Symptoms are better today.Had gallbladder removed in January this year.    HPI:   Tonya Hall is a 73 y.o. female who presents to the clinic today for follow-up visit.  Abdominal pain, nausea vomiting: Presented to [facility] 08/08/2023 with nausea vomiting and right lower quadrant abdominal pain.  Blood work largely unremarkable.  CT abdomen pelvis with contrast which I personally reviewed largely unremarkable from a GI standpoint.  MRI/MRCP 06/14/2023 showed small hiatal hernia, status post cholecystectomy, no biliary dilatation.  Status post cholecystectomy 04/15/2023, pathology with chronic cholecystitis, cholelithiasis.  EGD 11/2022: small hiatal hernia, mild Schatzki ring s/p dilation, gastritis, no h.pylori   Colonoscopy 04/2016: distal ileum normal, internal/external hemorrhoids, repeat colonoscopy 10 years.   Today, states she is back to baseline.  She was actually excited as she had gone 5 months without an episode until this most recent episode.  Past Medical History:  Diagnosis Date   Asthma    Albuterol  in haler prn   Cataract    immature unsure which eye   Chronic back pain    COVID-19 October/November 2020   Degenerative disk disease    psoriatic   Diabetes mellitus without complication (HCC)    diet controlled   Elevated liver enzymes    r/t leflunamide (this medication is put on hold intermittently due to this)   Esophageal motility disorder    Non-specific, see modified barium study/speech path, BP   GERD (gastroesophageal reflux disease)    takes Protonix  bid   History of blood transfusion    post c-section   History of bronchitis    History  of hiatal hernia    HTN (hypertension)    Hyperlipidemia    Lymphocytic colitis 05/26/2010   Responded to Entocort x 3 MOS   Neuropathy    Nonallergic rhinitis    Osteoporosis    gets Prolia  every 6 months   Peripheral edema    Pneumonia 02/2019   Psoriatic arthritis (HCC)    Dr. Annett Killer   Psoriatic arthritis [facility])    bilateral hands   Raynaud's disease    Seasonal allergies    Shingles 2020   SUI (stress urinary incontinence, female)    Urinary urgency     Past Surgical History:  Procedure Laterality Date   ANTERIOR CERVICAL DECOMP/DISCECTOMY FUSION N/A 03/09/2020   Procedure: ANTERIOR CERVICAL DECOMPRESSION/DISCECTOMY FUSION, INTERBODY PROSTHESIS, PLATE SCREWS CERVICAL FIVE-SIX, CERVICAL SIX-SEVEN;  Surgeon: Garry Kansas, MD;  Location: [facility] OR;  Service: Neurosurgery;  Laterality: N/A;   BACK SURGERY  2003   BALLOON DILATION N/A 11/12/2022   Procedure: BALLOON DILATION;  Surgeon: Vinetta Greening, DO;  Location: [facility];  Service: Endoscopy;  Laterality: N/A;   BIOPSY  04/17/2016   Procedure: BIOPSY;  Surgeon: Alyce Jubilee, MD;  Location: [facility];  Service: Endoscopy;;  random colon bx's   BIOPSY  11/12/2022   Procedure: BIOPSY;  Surgeon: Vinetta Greening, DO;  Location: [facility];  Service: Endoscopy;;   CERVICAL SPINE SURGERY  09/09/2017   CESAREAN SECTION  1974, 1978       CHOLECYSTECTOMY  04/2023   COLONOSCOPY  06/19/2008  [id number] internal hemorrhoids/mild sigmoin colon diverticulosis   COLONOSCOPY  2012   Dr. Riley Cheadle: normal rectum, diverticula, lymphocytic colitis    COLONOSCOPY WITH PROPOFOL  N/A 04/17/2016   Dr. Nolene Baumgarten: Normal terminal ileum, internal/external hemorrhoids, random colon biopsies consistent with collagenous colitis   DILATION AND CURETTAGE OF UTERUS  1977   spontaneous abortion   ESOPHAGOGASTRODUODENOSCOPY  06/19/2008   [id number] gastritis   ESOPHAGOGASTRODUODENOSCOPY (EGD) WITH PROPOFOL  N/A 11/12/2022   Procedure:  ESOPHAGOGASTRODUODENOSCOPY (EGD) WITH PROPOFOL ;  Surgeon: Vinetta Greening, DO;  Location: [facility];  Service: Endoscopy;  Laterality: N/A;  8:30 am, asa 3   LUMBAR DISC ARTHROPLASTY  09/1996   LUMBAR FUSION  6/03, 11/08, 11/14   L4-5 fusion, L2-3 fusion, L1-2   LUMBAR FUSION  08/03/2020   L4-L5   LUMBAR LAMINECTOMY  03/2020   MOUTH SURGERY  12/07/2021   oral surgery   ODONTOID SCREW INSERTION N/A 01/27/2018   Procedure: ODONTOID SCREW INSERTION;  Surgeon: Garry Kansas, MD;  Location: [facility] OR;  Service: Neurosurgery;  Laterality: N/A;  ODONTOID SCREW INSERTION   THORACIC DISCECTOMY N/A 07/18/2023   Procedure: THORACIC DISCECTOMY THORACIC ELEVEN-TWELVE DISCECTOMY;  Surgeon: Garry Kansas, MD;  Location: [facility] OR;  Service: Neurosurgery;  Laterality: N/A;  T11-T   TONSILLECTOMY     TONSILLECTOMY AND ADENOIDECTOMY     TUBAL LIGATION      Current Outpatient Medications  Medication Sig Dispense Refill   albuterol  (PROVENTIL  HFA;VENTOLIN  HFA) 108 (90 BASE) MCG/ACT inhaler Inhale 2 puffs into the lungs every 6 (six) hours as needed for wheezing or shortness of breath.      aspirin  81 MG tablet Take 81 mg by mouth daily.     Blood Glucose Monitoring Suppl (ONETOUCH VERIO FLEX SYSTEM) w/Device KIT      cetirizine (ZYRTEC) 10 MG tablet Take 10 mg by mouth daily as needed (allergies).     chlorhexidine  (PERIDEX ) 0.12 % solution Use as directed 15 mLs in the mouth or throat 2 (two) times daily.     cholecalciferol (VITAMIN D3) 25 MCG (1000 UNIT) tablet Take 1,000 Units by mouth at bedtime.     diazepam  (VALIUM ) 5 MG tablet Take 1 tablet (5 mg total) by mouth every 6 (six) hours as needed for anxiety. 30 tablet 0   empagliflozin  (JARDIANCE ) 25 MG TABS tablet Take 25 mg by mouth daily.     fish oil-omega-3 fatty acids  1000 MG capsule Take 2 capsules (2 g total) by mouth daily. 2-4 GRAMS DAILY (Patient taking differently: Take 1 g by mouth 2 (two) times daily.)     gabapentin  (NEURONTIN ) 600 MG  tablet TAKE ONE TABLET BY MOUTH THREE TIMES DAILY. 90 tablet 5   Ginger , Zingiber officinalis, (GINGER  ROOT) 500 MG CAPS Take 500 mg by mouth daily.     Glucosamine Sulfate 1000 MG TABS Take 2,000 mg by mouth daily.     HYDROcodone -acetaminophen  (NORCO) 7.5-325 MG tablet Take 1 tablet by mouth every 6 (six) hours as needed for moderate pain (pain score 4-6) or severe pain (pain score 7-10). For chronic pain 90 tablet 0   hydrocortisone  sodium succinate  (SOLU-CORTEF ) 100 MG SOLR injection Inject 100 mg into the muscle daily as needed (when unable to keep down prednisone  tablet).      Lancets (ONETOUCH DELICA PLUS LANCET33G) MISC USE TO TEST 3 TIMESUDAILY.     leflunomide  (ARAVA ) 10 MG tablet Take 1 tablet (10 mg total) by mouth daily. 30 tablet 2   metoprolol  succinate (TOPROL -XL) 50 MG 24  hr tablet Take 1 tablet (50 mg total) by mouth daily. Take with or immediately following a meal.     Misc Natural Products (TART CHERRY ADVANCED PO) Take 3,000 mg by mouth daily. daily     Multiple Vitamin (MULTIVITAMIN) tablet Take 1 tablet by mouth 3 (three) times a week.     nortriptyline  (PAMELOR ) 25 MG capsule TAKE (1) CAPSULE BY MOUTH AT BEDTIME. 30 capsule 0   olmesartan (BENICAR) 40 MG tablet Take 40 mg by mouth daily.     ondansetron  (ZOFRAN -ODT) 4 MG disintegrating tablet Take 1 tablet (4 mg total) by mouth every 8 (eight) hours as needed for nausea. 10 tablet 0   ONETOUCH VERIO test strip      OTEZLA  30 MG TABS Take 1 tablet (30 mg total) by mouth 2 (two) times daily. 180 tablet 0   oxybutynin  (DITROPAN -XL) 5 MG 24 hr tablet Take 1 tablet (5 mg total) by mouth at bedtime. 90 tablet 3   pantoprazole  (PROTONIX ) 40 MG tablet TAKE (1) TABLET BY MOUTH TWICE DAILY. 180 tablet 3   Polyethyl Glycol-Propyl Glycol (SYSTANE OP) Place 1 drop into both eyes 4 (four) times daily as needed (dry eyes).     potassium chloride  (KLOR-CON ) 20 MEQ packet Take 20 mEq by mouth daily.     predniSONE  (DELTASONE ) 5 MG tablet  TAKE 1 TABLET BY MOUTH DAILY WITH BREAKFAST 90 tablet 0   spironolactone  (ALDACTONE ) 25 MG tablet Take 25 mg by mouth daily.     TALTZ  80 MG/ML pen INJECT 80MG  (1 PEN) UNDER THE SKIN EVERY 28 DAYS 3 mL 0   topiramate  (TOPAMAX ) 200 MG tablet TAKE (1) TABLET BY MOUTH ONCE AT BEDTIME. 90 tablet 0   Turmeric 500 MG CAPS Take 500 mg by mouth every morning.     No current facility-administered medications for this visit.    Allergies as of 08/21/2023 - Review Complete 08/21/2023  Allergen Reaction Noted   Adalimumab Rash and Other (See Comments) 05/09/2021   Imuran [azathioprine sodium] Rash 07/03/2011   Methotrexate Other (See Comments) 05/09/2021   Plaquenil [hydroxychloroquine sulfate] Rash 07/03/2011   Sulfonamide derivatives Rash    Ceftin [cefuroxime axetil] Nausea Only 07/06/2014   Lisinopril Cough 09/22/2021   Sulfamethoxazole-trimethoprim  09/22/2021   Morphine Nausea And Vomiting     Family History  Problem Relation Age of Onset   Diabetes Mother    Pancreatitis Mother    Arthritis Mother        psoriatic    Fibromyalgia Mother    Rheumatic fever Father    Diabetes Other        grandparents   Skin cancer Other        grandfather   Hypertension Other        grandparent   Congestive Heart Failure Other        grandfather   Parkinson's disease Other        grandmother   Colon cancer Maternal Uncle    Fibromyalgia Sister    Rheum arthritis Sister    Diabetes Sister    Fibromyalgia Sister    Diabetes Sister    Rheum arthritis Sister    Hypertension Son    Colon polyps Neg Hx     Social History   Socioeconomic History   Marital status: Married    Spouse name: Not on file   Number of children: 2   Years of education: Not on file   Highest education level: Not on file  Occupational  History   Occupation: Case Midwife: [employer]    Comment: Estate manager/land agent  Tobacco Use   Smoking status: Never    Passive exposure: Never   Smokeless tobacco: Never   Vaping Use   Vaping status: Never Used  Substance and Sexual Activity   Alcohol  use: No   Drug use: No   Sexual activity: Not Currently    Partners: Male    Birth control/protection: Surgical    Comment: BTL  Other Topics Concern   Not on file  Social History Narrative   ATTENDS [employer]. 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   Social Drivers of Corporate investment banker Strain: Not on file  Food Insecurity: No Food Insecurity (07/18/2023)   Hunger Vital Sign    Worried About Running Out of Food in the Last Year: Never true    Ran Out of Food in the Last Year: Never true  Transportation Needs: No Transportation Needs (07/18/2023)   PRAPARE - Administrator, Civil Service (Medical): No    Lack of Transportation (Non-Medical): No  Physical Activity: Not on file  Stress: Not on file  Social Connections: Socially Integrated (07/18/2023)   Social Connection and Isolation Panel [NHANES]    Frequency of Communication with Friends and Family: More than three times a week    Frequency of Social Gatherings with Friends and Family: Three times a week    Attends Religious Services: More than 4 times per year    Active Member of Clubs or Organizations: Yes    Attends Banker Meetings: More than 4 times per year    Marital Status: Married    Subjective: Review of Systems  Constitutional:  Negative for chills and fever.  HENT:  Negative for congestion and hearing loss.   Eyes:  Negative for blurred vision and double vision.  Respiratory:  Negative for cough and shortness of breath.   Cardiovascular:  Negative for chest pain and palpitations.  Gastrointestinal:  Negative for abdominal pain, blood in stool, constipation, diarrhea, heartburn, melena and vomiting.  Genitourinary:  Negative for dysuria and urgency.  Musculoskeletal:  Negative for joint pain and myalgias.  Skin:  Negative for itching and rash.  Neurological:  Negative for dizziness and  headaches.  Psychiatric/Behavioral:  Negative for depression. The patient is not nervous/anxious.      Objective: BP 133/72 (BP Location: Left Arm, Patient Position: Sitting, Cuff Size: Normal)   Pulse 78   Temp 97.8 F (36.6 C) (Temporal)   Ht 4\' 10"  (1.473 m)   Wt 120 lb 9.6 oz (54.7 kg)   LMP 02/01/1999   BMI 25.21 kg/m  Physical Exam Constitutional:      Appearance: Normal appearance.  HENT:     Head: Normocephalic and atraumatic.  Eyes:     Extraocular Movements: Extraocular movements intact.     Conjunctiva/sclera: Conjunctivae normal.  Cardiovascular:     Rate and Rhythm: Normal rate and regular rhythm.  Pulmonary:     Effort: Pulmonary effort is normal.     Breath sounds: Normal breath sounds.  Abdominal:     General: Bowel sounds are normal.     Palpations: Abdomen is soft.  Musculoskeletal:        General: No swelling. Normal range of motion.     Cervical back: Normal range of motion and neck supple.  Skin:    General: Skin is warm and dry.     Coloration: Skin is not jaundiced.  Neurological:     General: No focal deficit present.     Mental Status: She is alert and oriented to person, place, and time.  Psychiatric:        Mood and Affect: Mood normal.        Behavior: Behavior normal.      Assessment: *Nausea and vomiting-recurrent *Upper abdominal pain *Chronic GERD  Plan: Cyclic vomiting syndrome on differential.  Status post cholecystectomy.  Continue on pantoprazole  twice daily.  Will trial on L-carnitine and coenzyme Q 10 to hopefully increase time between episodes.  Follow-up in 6 to 8 weeks.  08/21/2023 3:53 PM   Disclaimer: This note was dictated with voice recognition software. Similar sounding words can inadvertently be transcribed and may not be corrected upon review.

## 2023-09-02 ENCOUNTER — Other Ambulatory Visit (HOSPITAL_COMMUNITY): Payer: Self-pay | Admitting: Obstetrics & Gynecology

## 2023-09-02 DIAGNOSIS — Z1231 Encounter for screening mammogram for malignant neoplasm of breast: Secondary | ICD-10-CM

## 2023-09-05 DIAGNOSIS — L578 Other skin changes due to chronic exposure to nonionizing radiation: Secondary | ICD-10-CM | POA: Diagnosis not present

## 2023-09-05 DIAGNOSIS — L821 Other seborrheic keratosis: Secondary | ICD-10-CM | POA: Diagnosis not present

## 2023-09-05 DIAGNOSIS — L219 Seborrheic dermatitis, unspecified: Secondary | ICD-10-CM | POA: Diagnosis not present

## 2023-09-05 DIAGNOSIS — D2272 Melanocytic nevi of left lower limb, including hip: Secondary | ICD-10-CM | POA: Diagnosis not present

## 2023-09-05 DIAGNOSIS — Z86018 Personal history of other benign neoplasm: Secondary | ICD-10-CM | POA: Diagnosis not present

## 2023-09-05 DIAGNOSIS — L720 Epidermal cyst: Secondary | ICD-10-CM | POA: Diagnosis not present

## 2023-09-05 DIAGNOSIS — D2262 Melanocytic nevi of left upper limb, including shoulder: Secondary | ICD-10-CM | POA: Diagnosis not present

## 2023-09-05 DIAGNOSIS — D225 Melanocytic nevi of trunk: Secondary | ICD-10-CM | POA: Diagnosis not present

## 2023-09-10 ENCOUNTER — Ambulatory Visit: Payer: PPO | Admitting: Podiatry

## 2023-09-12 ENCOUNTER — Ambulatory Visit: Admitting: Gastroenterology

## 2023-09-12 ENCOUNTER — Encounter: Payer: Self-pay | Admitting: Gastroenterology

## 2023-09-12 ENCOUNTER — Encounter: Payer: Self-pay | Admitting: Internal Medicine

## 2023-09-12 VITALS — BP 132/71 | HR 114 | Temp 98.2°F | Ht <= 58 in | Wt 116.8 lb

## 2023-09-12 DIAGNOSIS — K449 Diaphragmatic hernia without obstruction or gangrene: Secondary | ICD-10-CM

## 2023-09-12 DIAGNOSIS — K219 Gastro-esophageal reflux disease without esophagitis: Secondary | ICD-10-CM

## 2023-09-12 DIAGNOSIS — R1111 Vomiting without nausea: Secondary | ICD-10-CM | POA: Diagnosis not present

## 2023-09-12 DIAGNOSIS — R1031 Right lower quadrant pain: Secondary | ICD-10-CM

## 2023-09-12 NOTE — Progress Notes (Signed)
 GI Office Note    Referring Provider: Artemisa Bile, MD Primary Care Physician:  Artemisa Bile, MD Primary Gastroenterologist: Rolando Cliche. Mordechai April, DO  Date:  09/12/2023  ID:  Tonya Hall, DOB Apr 09, 1950, MRN [id number]   Chief Complaint   Chief Complaint  Patient presents with   Abdominal Pain    RLQ abdominal pain, had an episode twice last night of nausea/vomiting    History of Present Illness  Tonya Hall is a 73 y.o. female with a history of shingles, Raynaud's, psoriatic arthritis, osteoporosis, lymphocytic colitis, HLD, HTN, GERD, asthma, and diabetes presenting today for acute visit with complaint of RLQ and nausea/vomiting.   Colonoscopy 04/2016: -distal ileum norma -internal/external hemorrhoids -repeat colonoscopy 10 years.   EGD 11/2022: -small hiatal hernia -mild Schatzki ring s/p dilation -gastritis, no h.pylori   RUQ US  02/21/2023 with hepatic steatosis otherwise unremarkable.  HIDA scan 03/22/2023 IMPRESSION: - Patent cystic and common bile ducts. - Elevated gallbladder ejection fraction as can be seen with gallbladder hyperkinesia.  Underwent cholecystectomy January 2025. Path with chronic cholecystitis.   MRI/MRCP 06/17/2023 IMPRESSION: - Status post cholecystectomy. - No biliary dilatation. No calculi or other obstruction identified.  CT A/P 08/08/2023 IMPRESSION: - No specific abnormality is identified to explain the patient's right lower quadrant pain. - Mildly mobile cecum in the right upper abdomen - evidence of cholecystectomy.  - Small to moderate-sized hiatal hernia. - Scattered colonic diverticula. - Progressive healing fractures of the left pubic rami. - Late phase healing of a left lower rib fracture.  Alpha gal panel neg 5/19.  Last office visit 08/21/23.  Reportedly had been doing well and back to baseline up until her ER visit on 5/8 with nausea, vomiting, right lower quadrant abdominal pain.  Mild leukocytosis noted on labs, mild  hyponatremia.  Normal LFTs on ED visit.  Cyclical vomiting syndrome remains on differential s/p cholecystectomy.  Advised to continue pantoprazole  twice daily and trial L-carnitine and coenzyme Q 10, follow-up in 6 to 8 weeks.   Today:  Patient was made an urgent office visit today given recurrent severe right lower quadrant abdominal pain with vomiting x 2 since yesterday around 3 PM which is waxing and waning overnight.  Had been busy - had a wedding out of town and got in late on Tuesday and then got up early on wednesday morning to take her sister to surgery. Had been doing lots of sitting over the weekend and for the surgery. Immediately before she had a handful of nuts and then sat down. She had had a protein shake for breakfast and did have some trail mix during the day waiting on her sister. Had cheeseburger and fries for lunch - did not eat the bread for the burger around lunch time. She took her home and then went to pharmacy and took her granddaughters somewhere and when she went to sit down all this started. She states the vomiting looked like coke - she had drank this just before going to bed.   She reports history of diverticulitis once before but never had pain - has known diverticulosis. She reports the vomiting issue has been going on for about a month and a half. She has been tacking the L carnitine and CoQ 10 - during the time she woke up and vomited and then dozed on and off and pain has been in the RLQ and this morning lift has been more diffuse throughout the middle of he abdomen. No appetite issues and she  is afraid to eat since then - has not eaten anything other than liquids. No fevers or chills. She does state that most every time this happens she has eaten something different than her normal. States she does noto eat lots of greasy foods or spicy foods.   Has been on nortriptyline  for about 2 years, previously on Cymbalta  but that caused diarrhea. Insurance wouldn't cover  amitriptyline . Admits to lots of spine issues. Denies issues with constipation other than brief periods of constipation and will take stool softener as needed during that time. When she eats sometimes she does have some urgency and mushy stool at times.   States she ate rabbit gumbo this weekend and had red beans and rice and that's all that was different. Did eat some different cheeses and she eats this regularly usually.   Not sure if she is lactose intolerant. Weight has been stable - she states during 1 week of illness she may have brief weight loss.   Takes pantoprazole  for reflux twice daily as well. She used to have evening spit up/reflux but does not do that often now - has used omeprazole  40 mg BID.   Wt Readings from Last 3 Encounters:  09/12/23 116 lb 12.8 oz (53 kg)  08/21/23 120 lb 9.6 oz (54.7 kg)  08/08/23 117 lb 15.1 oz (53.5 kg)    Current Outpatient Medications  Medication Sig Dispense Refill   albuterol  (PROVENTIL  HFA;VENTOLIN  HFA) 108 (90 BASE) MCG/ACT inhaler Inhale 2 puffs into the lungs every 6 (six) hours as needed for wheezing or shortness of breath.      aspirin  81 MG tablet Take 81 mg by mouth daily.     Blood Glucose Monitoring Suppl (ONETOUCH VERIO FLEX SYSTEM) w/Device KIT      cetirizine (ZYRTEC) 10 MG tablet Take 10 mg by mouth daily as needed (allergies).     chlorhexidine  (PERIDEX ) 0.12 % solution Use as directed 15 mLs in the mouth or throat 2 (two) times daily.     cholecalciferol (VITAMIN D3) 25 MCG (1000 UNIT) tablet Take 1,000 Units by mouth at bedtime.     diazepam  (VALIUM ) 5 MG tablet Take 1 tablet (5 mg total) by mouth every 6 (six) hours as needed for anxiety. 30 tablet 0   empagliflozin  (JARDIANCE ) 25 MG TABS tablet Take 25 mg by mouth daily.     fish oil-omega-3 fatty acids  1000 MG capsule Take 2 capsules (2 g total) by mouth daily. 2-4 GRAMS DAILY     gabapentin  (NEURONTIN ) 600 MG tablet TAKE ONE TABLET BY MOUTH THREE TIMES DAILY. 90 tablet 5    Ginger , Zingiber officinalis, (GINGER  ROOT) 500 MG CAPS Take 500 mg by mouth daily.     Glucosamine Sulfate 1000 MG TABS Take 2,000 mg by mouth daily.     HYDROcodone -acetaminophen  (NORCO) 7.5-325 MG tablet Take 1 tablet by mouth every 6 (six) hours as needed for moderate pain (pain score 4-6) or severe pain (pain score 7-10). For chronic pain 90 tablet 0   hydrocortisone  sodium succinate  (SOLU-CORTEF ) 100 MG SOLR injection Inject 100 mg into the muscle daily as needed (when unable to keep down prednisone  tablet).      Lancets (ONETOUCH DELICA PLUS LANCET33G) MISC USE TO TEST 3 TIMESUDAILY.     leflunomide  (ARAVA ) 10 MG tablet Take 1 tablet (10 mg total) by mouth daily. 30 tablet 2   metoprolol  succinate (TOPROL -XL) 50 MG 24 hr tablet Take 1 tablet (50 mg total) by mouth daily. Take with  or immediately following a meal.     Misc Natural Products (TART CHERRY ADVANCED PO) Take 3,000 mg by mouth daily. daily     Multiple Vitamin (MULTIVITAMIN) tablet Take 1 tablet by mouth 3 (three) times a week.     nortriptyline  (PAMELOR ) 25 MG capsule TAKE (1) CAPSULE BY MOUTH AT BEDTIME. 30 capsule 0   olmesartan (BENICAR) 40 MG tablet Take 40 mg by mouth daily.     ondansetron  (ZOFRAN -ODT) 4 MG disintegrating tablet Take 1 tablet (4 mg total) by mouth every 8 (eight) hours as needed for nausea. 10 tablet 0   ONETOUCH VERIO test strip      OTEZLA  30 MG TABS Take 1 tablet (30 mg total) by mouth 2 (two) times daily. 180 tablet 0   oxybutynin  (DITROPAN -XL) 5 MG 24 hr tablet Take 1 tablet (5 mg total) by mouth at bedtime. 90 tablet 3   pantoprazole  (PROTONIX ) 40 MG tablet TAKE (1) TABLET BY MOUTH TWICE DAILY. 180 tablet 3   Polyethyl Glycol-Propyl Glycol (SYSTANE OP) Place 1 drop into both eyes 4 (four) times daily as needed (dry eyes).     potassium chloride  (KLOR-CON ) 20 MEQ packet Take 20 mEq by mouth daily.     potassium chloride  SA (KLOR-CON  M) 20 MEQ tablet Take 20 mEq by mouth daily.     predniSONE   (DELTASONE ) 5 MG tablet TAKE 1 TABLET BY MOUTH DAILY WITH BREAKFAST 90 tablet 0   spironolactone  (ALDACTONE ) 25 MG tablet Take 25 mg by mouth daily.     TALTZ  80 MG/ML pen INJECT 80MG  (1 PEN) UNDER THE SKIN EVERY 28 DAYS 3 mL 0   topiramate  (TOPAMAX ) 200 MG tablet TAKE (1) TABLET BY MOUTH ONCE AT BEDTIME. 90 tablet 0   Turmeric 500 MG CAPS Take 500 mg by mouth every morning.     No current facility-administered medications for this visit.    Past Medical History:  Diagnosis Date   Asthma    Albuterol  in haler prn   Cataract    immature unsure which eye   Chronic back pain    COVID-19 October/November 2020   Degenerative disk disease    psoriatic   Diabetes mellitus without complication (HCC)    diet controlled   Elevated liver enzymes    r/t leflunamide (this medication is put on hold intermittently due to this)   Esophageal motility disorder    Non-specific, see modified barium study/speech path, BP   GERD (gastroesophageal reflux disease)    takes Protonix  bid   History of blood transfusion    post c-section   History of bronchitis    History of hiatal hernia    HTN (hypertension)    Hyperlipidemia    Lymphocytic colitis 05/26/2010   Responded to Entocort x 3 MOS   Neuropathy    Nonallergic rhinitis    Osteoporosis    gets Prolia  every 6 months   Peripheral edema    Pneumonia 02/2019   Psoriatic arthritis (HCC)    Dr. Annett Killer   Psoriatic arthritis [facility])    bilateral hands   Raynaud's disease    Seasonal allergies    Shingles 2020   SUI (stress urinary incontinence, female)    Urinary urgency     Past Surgical History:  Procedure Laterality Date   ANTERIOR CERVICAL DECOMP/DISCECTOMY FUSION N/A 03/09/2020   Procedure: ANTERIOR CERVICAL DECOMPRESSION/DISCECTOMY FUSION, INTERBODY PROSTHESIS, PLATE SCREWS CERVICAL FIVE-SIX, CERVICAL SIX-SEVEN;  Surgeon: Garry Kansas, MD;  Location: [facility] OR;  Service: Neurosurgery;  Laterality:  N/A;   BACK SURGERY  2003    BALLOON DILATION N/A 11/12/2022   Procedure: BALLOON DILATION;  Surgeon: Vinetta Greening, DO;  [facility];  Service: Endoscopy;  Laterality: N/A;   BIOPSY  04/17/2016   Procedure: BIOPSY;  Surgeon: Alyce Jubilee, MD;  Location: [facility];  Service: Endoscopy;;  random colon bx's   BIOPSY  11/12/2022   Procedure: BIOPSY;  Surgeon: Vinetta Greening, DO;  Location: [facility];  Service: Endoscopy;;   CERVICAL SPINE SURGERY  09/09/2017   CESAREAN SECTION  1974, 1978       CHOLECYSTECTOMY  04/2023   COLONOSCOPY  06/19/2008   [id number] internal hemorrhoids/mild sigmoin colon diverticulosis   COLONOSCOPY  2012   Dr. Riley Cheadle: normal rectum, diverticula, lymphocytic colitis    COLONOSCOPY WITH PROPOFOL  N/A 04/17/2016   Dr. Nolene Baumgarten: Normal terminal ileum, internal/external hemorrhoids, random colon biopsies consistent with collagenous colitis   DILATION AND CURETTAGE OF UTERUS  1977   spontaneous abortion   ESOPHAGOGASTRODUODENOSCOPY  06/19/2008   [id number] gastritis   ESOPHAGOGASTRODUODENOSCOPY (EGD) WITH PROPOFOL  N/A 11/12/2022   Procedure: ESOPHAGOGASTRODUODENOSCOPY (EGD) WITH PROPOFOL ;  Surgeon: Vinetta Greening, DO;  [facility];  Service: Endoscopy;  Laterality: N/A;  8:30 am, asa 3   LUMBAR DISC ARTHROPLASTY  09/1996   LUMBAR FUSION  6/03, 11/08, 11/14   L4-5 fusion, L2-3 fusion, L1-2   LUMBAR FUSION  08/03/2020   L4-L5   LUMBAR LAMINECTOMY  03/2020   MOUTH SURGERY  12/07/2021   oral surgery   ODONTOID SCREW INSERTION N/A 01/27/2018   Procedure: ODONTOID SCREW INSERTION;  Surgeon: Garry Kansas, MD;  Location: [facility] OR;  Service: Neurosurgery;  Laterality: N/A;  ODONTOID SCREW INSERTION   THORACIC DISCECTOMY N/A 07/18/2023   Procedure: THORACIC DISCECTOMY THORACIC ELEVEN-TWELVE DISCECTOMY;  Surgeon: Garry Kansas, MD;  Location: [facility] OR;  Service: Neurosurgery;  Laterality: N/A;  T11-T   TONSILLECTOMY     TONSILLECTOMY AND ADENOIDECTOMY     TUBAL  LIGATION      Family History  Problem Relation Age of Onset   Diabetes Mother    Pancreatitis Mother    Arthritis Mother        psoriatic    Fibromyalgia Mother    Rheumatic fever Father    Diabetes Other        grandparents   Skin cancer Other        grandfather   Hypertension Other        grandparent   Congestive Heart Failure Other        grandfather   Parkinson's disease Other        grandmother   Colon cancer Maternal Uncle    Fibromyalgia Sister    Rheum arthritis Sister    Diabetes Sister    Fibromyalgia Sister    Diabetes Sister    Rheum arthritis Sister    Hypertension Son    Colon polyps Neg Hx     Allergies as of 09/12/2023 - Review Complete 09/12/2023  Allergen Reaction Noted   Adalimumab Rash and Other (See Comments) 05/09/2021   Imuran [azathioprine sodium] Rash 07/03/2011   Methotrexate Other (See Comments) 05/09/2021   Plaquenil [hydroxychloroquine sulfate] Rash 07/03/2011   Sulfonamide derivatives Rash    Ceftin [cefuroxime axetil] Nausea Only 07/06/2014   Lisinopril Cough 09/22/2021   Sulfamethoxazole-trimethoprim  09/22/2021   Morphine Nausea And Vomiting     Social History   Socioeconomic History   Marital status: Married  Spouse name: Not on file   Number of children: 2   Years of education: Not on file   Highest education level: Not on file  Occupational History   Occupation: Case Midwife    Employer: [employer]    Comment: [employer]  Tobacco Use   Smoking status: Never    Passive exposure: Never   Smokeless tobacco: Never  Vaping Use   Vaping status: Never Used  Substance and Sexual Activity   Alcohol  use: No   Drug use: No   Sexual activity: Not Currently    Partners: Male    Birth control/protection: Surgical    Comment: BTL  Other Topics Concern   Not on file  Social History Narrative   ATTENDS [employer]. 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   Social Drivers of Corporate investment banker Strain: Not on file   Food Insecurity: No Food Insecurity (07/18/2023)   Hunger Vital Sign    Worried About Running Out of Food in the Last Year: Never true    Ran Out of Food in the Last Year: Never true  Transportation Needs: No Transportation Needs (07/18/2023)   PRAPARE - Administrator, Civil Service (Medical): No    Lack of Transportation (Non-Medical): No  Physical Activity: Not on file  Stress: Not on file  Social Connections: Socially Integrated (07/18/2023)   Social Connection and Isolation Panel    Frequency of Communication with Friends and Family: More than three times a week    Frequency of Social Gatherings with Friends and Family: Three times a week    Attends Religious Services: More than 4 times per year    Active Member of Clubs or Organizations: Yes    Attends Banker Meetings: More than 4 times per year    Marital Status: Married   Review of Systems   Gen: Denies fever, chills, anorexia. Denies fatigue, weakness, weight loss.  CV: Denies chest pain, palpitations, syncope, peripheral edema, and claudication. Resp: Denies dyspnea at rest, cough, wheezing, coughing up blood, and pleurisy. GI: See HPI Derm: Denies rash, itching, dry skin Psych: Denies depression, anxiety, memory loss, confusion. No homicidal or suicidal ideation.  Heme: Denies bruising, bleeding, and enlarged lymph nodes.  Physical Exam   BP 132/71 (BP Location: Right Arm, Patient Position: Sitting, Cuff Size: Normal)   Pulse (!) 114   Temp 98.2 F (36.8 C) (Oral)   Ht 4' 10 (1.473 m)   Wt 116 lb 12.8 oz (53 kg)   LMP 02/01/1999   SpO2 97%   BMI 24.41 kg/m   General:   Alert and oriented. No distress noted. Pleasant and cooperative.  Head:  Normocephalic and atraumatic. Eyes:  Conjuctiva clear without scleral icterus.  Abdomen:  +BS, soft, non-distended. Ttp to RLQ and umbilicus. Mild ttp to epigastrium. Increased muscular firmness to RLQ. Diastasis recti present. No rebound or  guarding. No HSM or masses noted. Rectal: deferred Msk:  Symmetrical without gross deformities. Normal posture. Extremities:  Without edema. Neurologic:  Alert and  oriented x4 Psych:  Alert and cooperative. Normal mood and affect.  Assessment  Tonya Hall is a 73 y.o. female with a history of chronic GERD, Reitnauer's, psoriatic arthritis, diabetes, HLD, asthma, and chronic nausea/vomiting with abdominal pain with brief improvement post-cholecystectomy January 2025 presenting today with recurrent nausea/vomiting and abdominal pain.  Recurrent vomiting, Chronic GERD, RLQ abdominal pain:  Workup thus far has included CT of abdomen pelvis without any specific abnormality although  cecum is mildly mobile in the right upper abdomen and has a medium sized hiatal hernia.  Hiatal hernia could be contributing to her recurrent vomiting as well as chronic reflux.  Alpha gal panel negative.  History of cholecystectomy given there was evidence of gallbladder hyperkinesia which initially suspected to be cause of vomiting.  Had done well for many months postcholecystectomy until recently when she had recurrence of right-sided abdominal pain and vomiting.  Also has nonhealing left lower rib fractures.  Given concern for possible cyclical vomiting syndrome given intermittent nature of symptoms she was started on L-carnitine and co-Q10 and again has been doing well initially.  Pain was still present today on exam however improved from the severity of the day prior. During prior episodes it was determined that she had also been very active, up and down out of the car multiple times and being very mobile.  Pain was worsening with movement.  On exam today had firmness/tense area on the right side of the abdomen but no overt hernia or mass palpated.  Suspect this could be MSK in nature given previously negative imaging. Although nortriptyline  used for neuropathy this can be beneficial with CVS so will increase dose and see  how she does.  Will continue L-carnitine and co-Q10.  Given the vomiting as well we will also increase pantoprazole  to twice daily given some epigastric discomfort today.  Will give additional Zofran  to keep on hand as needed.  Advised 24 hours of clear liquids and increase to soft diet.  Apply heat to the right lower quadrant to see if this improves pain.  PLAN    Continue pantoprazole  40 mg twice daily.  Trial increase nortriptyline  to 50 mg once nightly.  Continue L-carnitine and co-Q10 Apply heat to the right lower quadrant for 15 minutes at a time a few times a day if needed Zofran  as needed, take 4 mg every 8 hours and if after 10-15 minutes no improvement may take an additional dose. Clear liquids today, increase to soft diet tomorrow Continue protein shakes GERD diet Follow up in 3-4 weeks with Dr. Mordechai April.    Julian Obey, MSN, FNP-BC, AGACNP-BC [facility]

## 2023-09-12 NOTE — Patient Instructions (Addendum)
 Lets trial an increase of your nortriptyline  50 mg once nightly.  If you feel you are experiencing any unwanted side effects then please go back to your 25 mg nightly dose.  Continue taking the L-carnitine and co-Q10 daily as well as your pantoprazole  twice daily.  For today you may do a clear or full liquid diet and advance to more softer foods/your typical diet tomorrow.  Continue your protein shakes.  I am sending in Zofran  for you to use as needed.  You may take one 4 mg dose and after 10 to 15 minutes if you are not having any improvement you may take an additional dose.  Please be mindful that these additional/higher doses of Zofran  do run the risk of potential cardiac arrhythmias.  If you begin to experience any significant nausea, vomiting, fevers, chills, or significant/severe abdominal pain please let us  know.  Will discuss this with Dr. Mordechai April and you will follow-up arranged with him in the near future.  It was a pleasure to see you today. I want to create trusting relationships with patients. If you receive a survey regarding your visit,  I greatly appreciate you taking time to fill this out on paper or through your MyChart. I value your feedback.  Julian Obey, MSN, FNP-BC, AGACNP-BC [facility]

## 2023-09-16 ENCOUNTER — Ambulatory Visit (HOSPITAL_COMMUNITY)

## 2023-09-19 DIAGNOSIS — E119 Type 2 diabetes mellitus without complications: Secondary | ICD-10-CM | POA: Diagnosis not present

## 2023-09-23 ENCOUNTER — Encounter: Payer: PPO | Attending: Physical Medicine and Rehabilitation | Admitting: Registered Nurse

## 2023-09-23 ENCOUNTER — Encounter: Payer: Self-pay | Admitting: Registered Nurse

## 2023-09-23 VITALS — BP 127/71 | HR 85 | Ht <= 58 in | Wt 121.0 lb

## 2023-09-23 DIAGNOSIS — Z79891 Long term (current) use of opiate analgesic: Secondary | ICD-10-CM | POA: Diagnosis not present

## 2023-09-23 DIAGNOSIS — Z5181 Encounter for therapeutic drug level monitoring: Secondary | ICD-10-CM | POA: Diagnosis not present

## 2023-09-23 DIAGNOSIS — G894 Chronic pain syndrome: Secondary | ICD-10-CM | POA: Insufficient documentation

## 2023-09-23 NOTE — Patient Instructions (Addendum)
 You can Discuss With Dr Randene regarding Hydrocodone  verse Oxycodone .   Oxycodone  5 mg BID as needed for pain.  This medication does not have Tylenol  included.   Liver enzymes was reviewed: Today.

## 2023-09-23 NOTE — Progress Notes (Signed)
 Subjective:    Patient ID: Tonya Hall, female    DOB: 14-Feb-1951, 73 y.o.   MRN: [id number]  HPI: Tonya Hall is a 73 y.o. female who returns for follow up appointment for chronic pain and medication refill. She states her pain is located in her mid- back, bilateral hips  L>R and generalized joint pain. She rates her pain 5. Her current exercise regime is walking and performing stretching exercises.  Tonya Hall equivalent is 29.35 MME.   Oral Swab was Performed today.    Pain Inventory Average Pain 6 Pain Right Now 5 My pain is intermittent, constant, dull, and aching  In the last 24 hours, has pain interfered with the following? General activity 4 Relation with others 4 Enjoyment of life 6 What TIME of day is your pain at its worst? morning , daytime, evening, and night Sleep (in general) Good  Pain is worse with: standing and some activites Pain improves with: rest, heat/ice, pacing activities, and medication Relief from Meds: fair  Family History  Problem Relation Age of Onset   Diabetes Mother    Pancreatitis Mother    Arthritis Mother        psoriatic    Fibromyalgia Mother    Rheumatic fever Father    Diabetes Other        grandparents   Skin cancer Other        grandfather   Hypertension Other        grandparent   Congestive Heart Failure Other        grandfather   Parkinson's disease Other        grandmother   Colon cancer Maternal Uncle    Fibromyalgia Sister    Rheum arthritis Sister    Diabetes Sister    Fibromyalgia Sister    Diabetes Sister    Rheum arthritis Sister    Hypertension Son    Colon polyps Neg Hx    Social History   Socioeconomic History   Marital status: Married    Spouse name: Not on file   Number of children: 2   Years of education: Not on file   Highest education level: Not on file  Occupational History   Occupation: Case Midwife    Employer: [employer]    Comment: Estate manager/land agent  Tobacco Use   Smoking  status: Never    Passive exposure: Never   Smokeless tobacco: Never  Vaping Use   Vaping status: Never Used  Substance and Sexual Activity   Alcohol  use: No   Drug use: No   Sexual activity: Not Currently    Partners: Male    Birth control/protection: Surgical    Comment: BTL  Other Topics Concern   Not on file  Social History Narrative   ATTENDS [employer]. 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   Social Drivers of Corporate investment banker Strain: Not on file  Food Insecurity: No Food Insecurity (07/18/2023)   Hunger Vital Sign    Worried About Running Out of Food in the Last Year: Never true    Ran Out of Food in the Last Year: Never true  Transportation Needs: No Transportation Needs (07/18/2023)   PRAPARE - Administrator, Civil Service (Medical): No    Lack of Transportation (Non-Medical): No  Physical Activity: Not on file  Stress: Not on file  Social Connections: Socially Integrated (07/18/2023)   Social Connection and Isolation Panel    Frequency of Communication with  Friends and Family: More than three times a week    Frequency of Social Gatherings with Friends and Family: Three times a week    Attends Religious Services: More than 4 times per year    Active Member of Clubs or Organizations: Yes    Attends Banker Meetings: More than 4 times per year    Marital Status: Married   Past Surgical History:  Procedure Laterality Date   ANTERIOR CERVICAL DECOMP/DISCECTOMY FUSION N/A 03/09/2020   Procedure: ANTERIOR CERVICAL DECOMPRESSION/DISCECTOMY FUSION, INTERBODY PROSTHESIS, PLATE SCREWS CERVICAL FIVE-SIX, CERVICAL SIX-SEVEN;  Surgeon: Mavis Purchase, MD;  Location: [facility] OR;  Service: Neurosurgery;  Laterality: N/A;   BACK SURGERY  2003   BALLOON DILATION N/A 11/12/2022   Procedure: BALLOON DILATION;  Surgeon: Cindie Carlin POUR, DO;  [facility];  Service: Endoscopy;  Laterality: N/A;   BIOPSY  04/17/2016   Procedure: BIOPSY;   Surgeon: Margo LITTIE Haddock, MD;  [facility];  Service: Endoscopy;;  random colon bx's   BIOPSY  11/12/2022   Procedure: BIOPSY;  Surgeon: Cindie Carlin POUR, DO;  [facility];  Service: Endoscopy;;   CERVICAL SPINE SURGERY  09/09/2017   CESAREAN SECTION  1974, 1978       CHOLECYSTECTOMY  04/2023   COLONOSCOPY  06/19/2008   DOQ:dfjoo internal hemorrhoids/mild sigmoin colon diverticulosis   COLONOSCOPY  2012   Dr. Shaaron: normal rectum, diverticula, lymphocytic colitis    COLONOSCOPY WITH PROPOFOL  N/A 04/17/2016   Dr. Haddock: Normal terminal ileum, internal/external hemorrhoids, random colon biopsies consistent with collagenous colitis   DILATION AND CURETTAGE OF UTERUS  1977   spontaneous abortion   ESOPHAGOGASTRODUODENOSCOPY  06/19/2008   DOQ:fnizmjuz gastritis   ESOPHAGOGASTRODUODENOSCOPY (EGD) WITH PROPOFOL  N/A 11/12/2022   Procedure: ESOPHAGOGASTRODUODENOSCOPY (EGD) WITH PROPOFOL ;  Surgeon: Cindie Carlin POUR, DO;  [facility];  Service: Endoscopy;  Laterality: N/A;  8:30 am, asa 3   LUMBAR DISC ARTHROPLASTY  09/1996   LUMBAR FUSION  6/03, 11/08, 11/14   L4-5 fusion, L2-3 fusion, L1-2   LUMBAR FUSION  08/03/2020   L4-L5   LUMBAR LAMINECTOMY  03/2020   MOUTH SURGERY  12/07/2021   oral surgery   ODONTOID SCREW INSERTION N/A 01/27/2018   Procedure: ODONTOID SCREW INSERTION;  Surgeon: Mavis Purchase, MD;  Location: [facility] OR;  Service: Neurosurgery;  Laterality: N/A;  ODONTOID SCREW INSERTION   THORACIC DISCECTOMY N/A 07/18/2023   Procedure: THORACIC DISCECTOMY THORACIC ELEVEN-TWELVE DISCECTOMY;  Surgeon: Mavis Purchase, MD;  Location: [facility] OR;  Service: Neurosurgery;  Laterality: N/A;  T11-T   TONSILLECTOMY     TONSILLECTOMY AND ADENOIDECTOMY     TUBAL LIGATION     Past Surgical History:  Procedure Laterality Date   ANTERIOR CERVICAL DECOMP/DISCECTOMY FUSION N/A 03/09/2020   Procedure: ANTERIOR CERVICAL DECOMPRESSION/DISCECTOMY FUSION, INTERBODY  PROSTHESIS, PLATE SCREWS CERVICAL FIVE-SIX, CERVICAL SIX-SEVEN;  Surgeon: Mavis Purchase, MD;  Location: [facility] OR;  Service: Neurosurgery;  Laterality: N/A;   BACK SURGERY  2003   BALLOON DILATION N/A 11/12/2022   Procedure: BALLOON DILATION;  Surgeon: Cindie Carlin POUR, DO;  [facility];  Service: Endoscopy;  Laterality: N/A;   BIOPSY  04/17/2016   Procedure: BIOPSY;  Surgeon: Margo LITTIE Haddock, MD;  [facility];  Service: Endoscopy;;  random colon bx's   BIOPSY  11/12/2022   Procedure: BIOPSY;  Surgeon: Cindie Carlin POUR, DO;  [facility];  Service: Endoscopy;;   CERVICAL SPINE SURGERY  09/09/2017  CESAREAN SECTION  1974, 1978       CHOLECYSTECTOMY  04/2023   COLONOSCOPY  06/19/2008   DOQ:dfjoo internal hemorrhoids/mild sigmoin colon diverticulosis   COLONOSCOPY  2012   Dr. Shaaron: normal rectum, diverticula, lymphocytic colitis    COLONOSCOPY WITH PROPOFOL  N/A 04/17/2016   Dr. Harvey: Normal terminal ileum, internal/external hemorrhoids, random colon biopsies consistent with collagenous colitis   DILATION AND CURETTAGE OF UTERUS  1977   spontaneous abortion   ESOPHAGOGASTRODUODENOSCOPY  06/19/2008   DOQ:fnizmjuz gastritis   ESOPHAGOGASTRODUODENOSCOPY (EGD) WITH PROPOFOL  N/A 11/12/2022   Procedure: ESOPHAGOGASTRODUODENOSCOPY (EGD) WITH PROPOFOL ;  Surgeon: Cindie Carlin POUR, DO;  [facility];  Service: Endoscopy;  Laterality: N/A;  8:30 am, asa 3   LUMBAR DISC ARTHROPLASTY  09/1996   LUMBAR FUSION  6/03, 11/08, 11/14   L4-5 fusion, L2-3 fusion, L1-2   LUMBAR FUSION  08/03/2020   L4-L5   LUMBAR LAMINECTOMY  03/2020   MOUTH SURGERY  12/07/2021   oral surgery   ODONTOID SCREW INSERTION N/A 01/27/2018   Procedure: ODONTOID SCREW INSERTION;  Surgeon: Mavis Purchase, MD;  Location: [facility] OR;  Service: Neurosurgery;  Laterality: N/A;  ODONTOID SCREW INSERTION   THORACIC DISCECTOMY N/A 07/18/2023   Procedure: THORACIC DISCECTOMY THORACIC  ELEVEN-TWELVE DISCECTOMY;  Surgeon: Mavis Purchase, MD;  Location: [facility] OR;  Service: Neurosurgery;  Laterality: N/A;  T11-T   TONSILLECTOMY     TONSILLECTOMY AND ADENOIDECTOMY     TUBAL LIGATION     Past Medical History:  Diagnosis Date   Asthma    Albuterol  in haler prn   Cataract    immature unsure which eye   Chronic back pain    COVID-19 October/November 2020   Degenerative disk disease    psoriatic   Diabetes mellitus without complication (HCC)    diet controlled   Elevated liver enzymes    r/t leflunamide (this medication is put on hold intermittently due to this)   Esophageal motility disorder    Non-specific, see modified barium study/speech path, BP   GERD (gastroesophageal reflux disease)    takes Protonix  bid   History of Hall transfusion    post c-section   History of bronchitis    History of hiatal hernia    HTN (hypertension)    Hyperlipidemia    Lymphocytic colitis 05/26/2010   Responded to Entocort x 3 MOS   Neuropathy    Nonallergic rhinitis    Osteoporosis    gets Prolia  every 6 months   Peripheral edema    Pneumonia 02/2019   Psoriatic arthritis (HCC)    Dr. Elin   Psoriatic arthritis [facility])    bilateral hands   Raynaud's disease    Seasonal allergies    Shingles 2020   SUI (stress urinary incontinence, female)    Urinary urgency    LMP 02/01/1999   Opioid Risk Score:   Fall Risk Score:  `1  Depression screen [facility] 2/9     07/24/2023    9:30 AM 05/21/2023    9:11 AM 03/20/2023    9:50 AM 02/20/2023    1:05 PM 12/13/2022   10:04 AM 09/19/2022   10:21 AM 06/15/2022    9:42 AM  Depression screen PHQ 2/9  Decreased Interest 0 0 0 0 0 0 0  Down, Depressed, Hopeless 0 0 0 0 0 0 0  PHQ - 2 Score 0 0 0 0 0 0 0  Altered sleeping       0  Tired, decreased energy  2  Change in appetite       0  Feeling bad or failure about yourself        0  Trouble concentrating       0  Moving slowly or fidgety/restless       1  Suicidal thoughts        0  PHQ-9 Score       3  Difficult doing work/chores       Very difficult    Review of Systems  Musculoskeletal:  Positive for back pain and gait problem.       Left  hip pain, right side pain, pain in toes on right foot  All other systems reviewed and are negative.       Objective:   Physical Exam Vitals and nursing note reviewed.  Constitutional:      Appearance: Normal appearance.  Cardiovascular:     Rate and Rhythm: Normal rate and regular rhythm.     Pulses: Normal pulses.     Heart sounds: Normal heart sounds.  Pulmonary:     Effort: Pulmonary effort is normal.     Breath sounds: Normal breath sounds.  Musculoskeletal:     Comments: Normal Muscle Bulk and Muscle Testing Reveals:  Upper Extremities: Full ROM and Muscle Strength 5/5 Thoracic Paraspinal Tenderness: T-4-T-6 Mainly Right Side  Lumbar Paraspinal Tenderness: L-3-L-5 Bilateral Greater Trochanter Tenderness: L>R Lower Extremities : Full ROM and Muscle Strength 5/5 Arises from Table slowly using cane for support Narrow Based Gait     Skin:    General: Skin is warm and dry.  Neurological:     Mental Status: She is alert and oriented to person, place, and time.  Psychiatric:        Mood and Affect: Mood normal.        Behavior: Behavior normal.          Assessment & Plan:  Cervicalgia: No complaints today.  Continue HEP as Tolerated. Continue to Monitor. 09/23/2023 Discogenic Thoracic Pain: Neurosurgery Following. Continue HEP as Tolerated. Continue to Monitor. 09/23/2023 Greater Trochanteric Bursitis: Continue HEP as Tolerated. Continue to Monitor. 09/23/2023 Primary Osteoarthritis in multiple Joints. Continue current medication regimen. Continue to monitor. 09/23/2023 Psoriatic Arthritis: Continue current medication regimen. Continue to monitor. 09/23/2023 Chronic Pain Syndrome: Refilled: Hydrocodone  7.5/325 mg one tablet every 6 hours as needed for pain #90. We will continue the opioid  monitoring program, this consists of regular clinic visits, examinations, urine drug screen, pill counts as well as use of [place]  Controlled Substance Reporting system. A 12 month History has been reviewed on the [place]  Controlled Substance Reporting System on 09/23/2023.    F/U with Dr Lovorn in 2 months

## 2023-09-26 ENCOUNTER — Ambulatory Visit: Admitting: Podiatry

## 2023-09-26 ENCOUNTER — Encounter: Payer: Self-pay | Admitting: Podiatry

## 2023-09-26 DIAGNOSIS — N1831 Chronic kidney disease, stage 3a: Secondary | ICD-10-CM | POA: Diagnosis not present

## 2023-09-26 DIAGNOSIS — I10 Essential (primary) hypertension: Secondary | ICD-10-CM | POA: Diagnosis not present

## 2023-09-26 DIAGNOSIS — E1129 Type 2 diabetes mellitus with other diabetic kidney complication: Secondary | ICD-10-CM | POA: Diagnosis not present

## 2023-09-26 DIAGNOSIS — M810 Age-related osteoporosis without current pathological fracture: Secondary | ICD-10-CM | POA: Diagnosis not present

## 2023-09-26 DIAGNOSIS — E119 Type 2 diabetes mellitus without complications: Secondary | ICD-10-CM | POA: Diagnosis not present

## 2023-09-26 DIAGNOSIS — M7751 Other enthesopathy of right foot: Secondary | ICD-10-CM

## 2023-09-26 LAB — DRUG TOX MONITOR 1 W/CONF, ORAL FLD

## 2023-09-26 LAB — DRUG TOX ALC METAB W/CON, ORAL FLD: Alcohol Metabolite: NEGATIVE ng/mL (ref <25)

## 2023-09-26 MED ORDER — TRIAMCINOLONE ACETONIDE 10 MG/ML IJ SUSP
10.0000 mg | Freq: Once | INTRAMUSCULAR | Status: AC
  Administered 2023-09-26: 10 mg via INTRA_ARTICULAR

## 2023-09-27 NOTE — Progress Notes (Signed)
 Subjective:   Patient ID: Tonya Hall Blood, female   DOB: 73 y.o.   MRN: [id number]   HPI Patient states she was doing very well and then started to develop pain again over the last few weeks.  Not been seen in about 10 months   ROS      Objective:  Physical Exam  Neurovascular status intact with exquisite discomfort in the second MPJ  right with fluid buildup around the joint surface but acute nature     Assessment:  Acute capsulitis right     Plan:  H&P explained condition and since it has been 10 months I went ahead did sterile prep and injected periarticular around the MPJ which did well and applied sterile dressing.  Discussed rigid bottom shoes and reappoint as symptoms indicate

## 2023-10-07 ENCOUNTER — Encounter: Payer: Self-pay | Admitting: Internal Medicine

## 2023-10-07 ENCOUNTER — Other Ambulatory Visit: Payer: Self-pay | Admitting: Physician Assistant

## 2023-10-07 DIAGNOSIS — Z79899 Other long term (current) drug therapy: Secondary | ICD-10-CM

## 2023-10-07 DIAGNOSIS — L409 Psoriasis, unspecified: Secondary | ICD-10-CM

## 2023-10-07 DIAGNOSIS — L4050 Arthropathic psoriasis, unspecified: Secondary | ICD-10-CM

## 2023-10-07 NOTE — Telephone Encounter (Signed)
 Last Fill: 07/29/2023  Labs: 08/08/2023 WBC 10.7, Sodium 132, CO2 20, Glucose 196  TB Gold: 09/20/2022 Neg    Next Visit: 10/30/2023  Last Visit: 05/15/2023  IK:Ednmpjupr arthritis   Current Dose per office note 05/15/2023: Taltz  80 mg sq injections q 4 weeks   Patient update TB Gold at upcoming appointment on 10/30/2023.   Okay to refill Taltz ?

## 2023-10-08 ENCOUNTER — Other Ambulatory Visit: Payer: Self-pay | Admitting: Gastroenterology

## 2023-10-08 MED ORDER — ONDANSETRON 4 MG PO TBDP: 4.0000 mg | ORAL_TABLET | Freq: Three times a day (TID) | ORAL | 1 refills | Status: AC | PRN

## 2023-10-08 MED ORDER — PROMETHAZINE HCL 12.5 MG PO TABS: 12.5000 mg | ORAL_TABLET | Freq: Three times a day (TID) | ORAL | 0 refills | Status: DC | PRN

## 2023-10-11 ENCOUNTER — Other Ambulatory Visit: Payer: Self-pay | Admitting: Physician Assistant

## 2023-10-11 ENCOUNTER — Other Ambulatory Visit: Payer: Self-pay | Admitting: Rheumatology

## 2023-10-11 NOTE — Telephone Encounter (Signed)
 Last Fill: 07/10/2023   Next Visit: 10/30/2023   Last Visit: 05/15/2023   Dx: Psoriatic arthritis    Current Dose per office note on 05/15/2023: prednisone  5 mg 1 tablet daily    Okay to refill Prednisone ?

## 2023-10-11 NOTE — Telephone Encounter (Signed)
 Last Fill: 07/02/2023  Labs: 08/08/2023 WBC 10.7, Sodium 132, CO2 20, Glucose 196,   Next Visit: 10/30/2023  Last Visit: 05/15/2023  DX: Psoriatic arthritis   Current Dose per office note 05/15/2023: Arava  20mg  1 tablet daily   Okay to refill Arava  ?

## 2023-10-15 DIAGNOSIS — R5383 Other fatigue: Secondary | ICD-10-CM | POA: Diagnosis not present

## 2023-10-15 DIAGNOSIS — I10 Essential (primary) hypertension: Secondary | ICD-10-CM | POA: Diagnosis not present

## 2023-10-15 DIAGNOSIS — R531 Weakness: Secondary | ICD-10-CM | POA: Diagnosis not present

## 2023-10-16 NOTE — Progress Notes (Signed)
 Office Visit Note  Patient: Tonya Hall             Date of Birth: October 16, 1950           MRN: [id number]             PCP: Sheryle Carwin, MD Referring: Sheryle Carwin, MD Visit Date: 10/30/2023 Occupation: @GUAROCC @  Subjective:  pain in her left leg.    History of Present Illness: Tonya Hall is a 73 y.o. female with psoriatic arthritis, degenerative disc disease and osteoarthritis.  She returns today after her last visit in February 2025.  She underwent thoracic laminectomy in April 2025 and had good results from that.  She states she has been diagnosed with cyclical vomiting.  She loses weight and then gains back gradually.  She continues to have lower back pain and also left trochanteric bursa pain.  She states the pain was worse last week and is gradually getting better.  She continues to be on Taltz  80 mg subcu every 4 weeks, Areva 10 mg p.o. daily and Otezla  30 mg p.o. twice daily and prednisone  5 mg p.o. daily.  The dose of RA was of was reduced in March 2025 due to elevated LFTs.  Her LFTs are normal now.  She states she would like to go increase the dose of her Arava  back to 20 mg p.o. daily as her joint symptoms are better controlled on the higher dose.  She has noticed increased swelling in her hands.  She states her MTPs are more painful.  She has been getting injections by Dr. Magdalen intermittently.    Activities of Daily Living:  Patient reports morning stiffness for 24 hours.   Patient Denies nocturnal pain.  Difficulty dressing/grooming: Reports Difficulty climbing stairs: Reports Difficulty getting out of chair: Reports Difficulty using hands for taps, buttons, cutlery, and/or writing: Reports  Review of Systems  Constitutional:  Positive for fatigue.  HENT:  Positive for mouth dryness. Negative for mouth sores.   Eyes:  Positive for dryness.  Respiratory:  Negative for shortness of breath.   Cardiovascular:  Negative for chest pain and palpitations.   Gastrointestinal:  Negative for blood in stool, constipation and diarrhea.  Endocrine: Negative for increased urination.  Genitourinary:  Negative for involuntary urination.  Musculoskeletal:  Positive for joint pain, gait problem, joint pain, joint swelling, myalgias, muscle weakness, morning stiffness and myalgias. Negative for muscle tenderness.  Skin:  Positive for color change and rash. Negative for hair loss and sensitivity to sunlight.  Allergic/Immunologic: Negative for susceptible to infections.  Neurological:  Positive for dizziness. Negative for headaches.  Hematological:  Positive for swollen glands.  Psychiatric/Behavioral:  Negative for depressed mood and sleep disturbance. The patient is not nervous/anxious.     PMFS History:  Patient Active Problem List   Diagnosis Date Noted   Degenerative thoracic spinal stenosis 07/18/2023   Myelopathy concurrent with and due to spinal stenosis of thoracic region [facility]) 07/18/2023   Upper abdominal pain 06/10/2023   Biliary dyskinesia 04/15/2023   Contracture of joint of hand 03/20/2023   Discogenic thoracic pain 03/20/2023   Encounter for diabetic foot exam (HCC) 01/28/2023   Diabetic peripheral neuropathy associated with type 2 diabetes mellitus (HCC) 01/28/2023   Acquired hammertoes of both feet 01/28/2023   Hallux valgus, acquired, bilateral 01/28/2023   Corns and callosities 01/28/2023   Onychomycosis 01/28/2023   N&V (nausea and vomiting) 01/28/2023   History of lumbar fusion 01/23/2023   Sciatica 01/23/2023  Statin not tolerated 10/23/2021   Chronic allergic conjunctivitis 07/29/2021   Vasomotor rhinitis 07/29/2021   Benign neoplasm of skin of lower limb 05/17/2021   History of neoplasm 05/17/2021   Melanocytic nevi of left upper limb, including shoulder 05/17/2021   Melanocytic nevi of trunk 05/17/2021   Other seborrheic keratosis 05/17/2021   Diarrhea 03/10/2021   Fracture of pelvis (HCC) 01/27/2021   Pain of left  hip joint 01/06/2021   Foraminal stenosis of lumbar region 08/03/2020   Cervical spondylosis with myelopathy and radiculopathy 03/09/2020   AKI (acute kidney injury) (HCC) 02/07/2019   Acute respiratory failure with hypoxia (HCC) 02/06/2019   Acute respiratory disease due to COVID-19 virus 02/06/2019   Community acquired pneumonia 02/06/2019   Hyponatremia 02/06/2019   Asthma    Diabetes mellitus without complication (HCC)    Closed nondisplaced odontoid fracture with type II morphology (HCC) 01/27/2018   Odontoid fracture (HCC) 01/24/2018   Chest pain 11/14/2017   Transaminitis 06/22/2016   Collagenous colitis 05/23/2016   Psoriasis 05/21/2016   High risk medication use 02/15/2016   Age-related osteoporosis without current pathological fracture 02/15/2016   Trochanteric bursitis, left hip 02/15/2016   Sacroiliitis, not elsewhere classified (HCC) 02/15/2016   Chronic pain syndrome 02/15/2016   Abnormal weight gain 06/25/2012   Hypertension 07/03/2011   Hypercholesteremia 07/03/2011   Psoriatic arthritis (HCC) 07/03/2011   Osteoarthritis of multiple joints 07/03/2011   Esophageal motility disorder 02/14/2011   Cough 02/14/2011   GERD 05/05/2010    Past Medical History:  Diagnosis Date   Asthma    Albuterol  in haler prn   Cataract    immature unsure which eye   Chronic back pain    COVID-19 October/November 2020   Degenerative disk disease    psoriatic   Diabetes mellitus without complication (HCC)    diet controlled   Elevated liver enzymes    r/t leflunamide (this medication is put on hold intermittently due to this)   Esophageal motility disorder    Non-specific, see modified barium study/speech path, BP   GERD (gastroesophageal reflux disease)    takes Protonix  bid   History of blood transfusion    post c-section   History of bronchitis    History of hiatal hernia    HTN (hypertension)    Hyperlipidemia    Lymphocytic colitis 05/26/2010   Responded to Entocort  x 3 MOS   Neuropathy    Nonallergic rhinitis    Osteoporosis    gets Prolia  every 6 months   Peripheral edema    Pneumonia 02/2019   Psoriatic arthritis (HCC)    Dr. Elin   Psoriatic arthritis [facility])    bilateral hands   Raynaud's disease    Seasonal allergies    Shingles 2020   SUI (stress urinary incontinence, female)    Urinary urgency     Family History  Problem Relation Age of Onset   Diabetes Mother    Pancreatitis Mother    Arthritis Mother        psoriatic    Fibromyalgia Mother    Rheumatic fever Father    Diabetes Other        grandparents   Skin cancer Other        grandfather   Hypertension Other        grandparent   Congestive Heart Failure Other        grandfather   Parkinson's disease Other        grandmother   Colon cancer Maternal  Uncle    Fibromyalgia Sister    Rheum arthritis Sister    Diabetes Sister    Fibromyalgia Sister    Diabetes Sister    Rheum arthritis Sister    Hypertension Son    Colon polyps Neg Hx    Past Surgical History:  Procedure Laterality Date   ANTERIOR CERVICAL DECOMP/DISCECTOMY FUSION N/A 03/09/2020   Procedure: ANTERIOR CERVICAL DECOMPRESSION/DISCECTOMY FUSION, INTERBODY PROSTHESIS, PLATE SCREWS CERVICAL FIVE-SIX, CERVICAL SIX-SEVEN;  Surgeon: Mavis Purchase, MD;  Location: [facility] OR;  Service: Neurosurgery;  Laterality: N/A;   BACK SURGERY  2003   BALLOON DILATION N/A 11/12/2022   Procedure: BALLOON DILATION;  Surgeon: Cindie Carlin POUR, DO;  [facility];  Service: Endoscopy;  Laterality: N/A;   BIOPSY  04/17/2016   Procedure: BIOPSY;  Surgeon: Margo LITTIE Haddock, MD;  Location[facility];  Service: Endoscopy;;  random colon bx's   BIOPSY  11/12/2022   Procedure: BIOPSY;  Surgeon: Cindie Carlin POUR, DO;  [facility];  Service: Endoscopy;;   CERVICAL SPINE SURGERY  09/09/2017   CESAREAN SECTION  1974, 1978       CHOLECYSTECTOMY  04/2023   COLONOSCOPY  06/19/2008   DOQ:dfjoo internal  hemorrhoids/mild sigmoin colon diverticulosis   COLONOSCOPY  2012   Dr. Shaaron: normal rectum, diverticula, lymphocytic colitis    COLONOSCOPY WITH PROPOFOL  N/A 04/17/2016   Dr. Haddock: Normal terminal ileum, internal/external hemorrhoids, random colon biopsies consistent with collagenous colitis   DILATION AND CURETTAGE OF UTERUS  1977   spontaneous abortion   ESOPHAGOGASTRODUODENOSCOPY  06/19/2008   DOQ:fnizmjuz gastritis   ESOPHAGOGASTRODUODENOSCOPY (EGD) WITH PROPOFOL  N/A 11/12/2022   Procedure: ESOPHAGOGASTRODUODENOSCOPY (EGD) WITH PROPOFOL ;  Surgeon: Cindie Carlin POUR, DO;  [facility];  Service: Endoscopy;  Laterality: N/A;  8:30 am, asa 3   LUMBAR DISC ARTHROPLASTY  09/1996   LUMBAR FUSION  6/03, 11/08, 11/14   L4-5 fusion, L2-3 fusion, L1-2   LUMBAR FUSION  08/03/2020   L4-L5   LUMBAR LAMINECTOMY  03/2020   MOUTH SURGERY  12/07/2021   oral surgery   ODONTOID SCREW INSERTION N/A 01/27/2018   Procedure: ODONTOID SCREW INSERTION;  Surgeon: Mavis Purchase, MD;  Location: [facility] OR;  Service: Neurosurgery;  Laterality: N/A;  ODONTOID SCREW INSERTION   THORACIC DISCECTOMY N/A 07/18/2023   Procedure: THORACIC DISCECTOMY THORACIC ELEVEN-TWELVE DISCECTOMY;  Surgeon: Mavis Purchase, MD;  Location: [facility] OR;  Service: Neurosurgery;  Laterality: N/A;  T11-T   TONSILLECTOMY     TONSILLECTOMY AND ADENOIDECTOMY     TUBAL LIGATION     Social History   Social History Narrative   ATTENDS [employer]. 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   Immunization History  Administered Date(s) Administered   Influenza,inj,Quad PF,6+ Mos 01/21/2017   Influenza-Unspecified 01/30/2017   Moderna Sars-Covid-2 Vaccination 06/01/2019, 07/02/2019   Pneumococcal Polysaccharide-23 02/19/2013   Tdap 09/10/2016     Objective: Vital Signs: BP 129/71 (BP Location: Left Arm, Patient Position: Sitting, Cuff Size: Normal)   Pulse 74   Resp 14   Ht 4' 10 (1.473 m)   Wt 120 lb (54.4 kg)   LMP  02/01/1999   BMI 25.08 kg/m    Physical Exam Vitals and nursing note reviewed.  Constitutional:      Appearance: She is well-developed.  HENT:     Head: Normocephalic and atraumatic.  Eyes:     Conjunctiva/sclera: Conjunctivae normal.  Cardiovascular:     Rate and Rhythm: Normal rate and regular rhythm.  Heart sounds: Normal heart sounds.  Pulmonary:     Effort: Pulmonary effort is normal.     Breath sounds: Normal breath sounds.  Abdominal:     General: Bowel sounds are normal.     Palpations: Abdomen is soft.  Musculoskeletal:     Cervical back: Normal range of motion.  Lymphadenopathy:     Cervical: No cervical adenopathy.  Skin:    General: Skin is warm and dry.     Capillary Refill: Capillary refill takes less than 2 seconds.  Neurological:     Mental Status: She is alert and oriented to person, place, and time.  Psychiatric:        Behavior: Behavior normal.      Musculoskeletal Exam: She had limited range of motion of the cervical spine.  Thoracic kyphosis was noted.  Limited range of motion of the thoracic and lumbar spine was noted.  Shoulders and elbow joints were in good range of motion.  She had no tenderness over her wrist joints.  She had some inflammation over bilateral PIP joints.  Bilateral PIP and DIP thickening with subluxation of PIP and DIP joints was noted.  She had incomplete fist formation and limited extension of her PIP and DIP joints.  Bilateral CMC subluxation was noted.  Hip joints had limited range of motion with some discomfort.  Knee joints were in good range of motion without any warmth swelling or effusion.  She had some tenderness across MTPs without synovitis.  There was no plantar fasciitis or Achilles tendinitis.  CDAI Exam: CDAI Score: -- Patient Global: --; Provider Global: -- Swollen: --; Tender: -- Joint Exam 10/30/2023   No joint exam has been documented for this visit   There is currently no information documented on the  homunculus. Go to the Rheumatology activity and complete the homunculus joint exam.  Investigation: No additional findings.  Imaging: No results found.  Recent Labs: Lab Results  Component Value Date   WBC 10.7 (H) 08/08/2023   HGB 14.7 08/08/2023   PLT 381 08/08/2023   NA 132 (L) 08/08/2023   K 3.7 08/08/2023   CL 102 08/08/2023   CO2 20 (L) 08/08/2023   GLUCOSE 196 (H) 08/08/2023   BUN 21 08/08/2023   CREATININE 0.88 08/08/2023   BILITOT 0.7 08/08/2023   ALKPHOS 75 08/08/2023   AST 23 08/08/2023   ALT 29 08/08/2023   PROT 7.4 08/08/2023   ALBUMIN  4.0 08/08/2023   CALCIUM  9.4 08/08/2023   GFRAA 65 06/08/2020   QFTBGOLD Negative 05/05/2015   QFTBGOLDPLUS NEGATIVE 09/20/2022    Speciality Comments: MTX- high LFTs, PLQ,SSZ,Imuran, Humira- allergy, Enbrel , Remicade  - inadequate response  Procedures:  No procedures performed Allergies: Adalimumab, Imuran [azathioprine sodium], Methotrexate, Plaquenil [hydroxychloroquine sulfate], Sulfonamide derivatives, Ceftin [cefuroxime axetil], Lisinopril, Sulfamethoxazole-trimethoprim, and Morphine   Assessment / Plan:     Visit Diagnoses: Psoriatic arthritis (HCC) - Inflammatory arthritis, sacroiliitis and psoriasis: She has been experiencing increased pain and discomfort since her dose of Arava  was reduced from 20 to 10 mg p.o. daily.  She had inflammation in the PIP joints.  She is also getting injections in her MTP joints by Dr. Magdalen.  None of the other joints were inflamed.  Her LFTs have been consistently normal.  Patient wants to increase Arava  to 20 mg p.o. daily.  I advised her to increase Arava  to 20 mg p.o. daily and get labs in August as planned.  Will recheck her LFTs.  Psoriasis-she gets occasional patches of  psoriasis in her scalp.  No lesions were noted today.  She uses topical agents as needed.  High risk medication use - Taltz  80 mg sq injections q 4 weeks, Arava  10mg  1 tablet daily, Otezla  30 mg 1 tablet by mouth  twice daily, and prednisone  5 mg 1 tablet daily.  She will be increasing the dose of Areva to 20 mg p.o. daily.  Repeat labs will include CBC with differential, CMP in August.  Flexor tenosynovitis of finger-improved.  Trochanteric bursitis of both hips - Cortisone injection November 22, 2022.  She continues to have intermittent discomfort in the trochanteric region.  Chronic left SI joint pain - Injected May 29, 2022.  Chronic discomfort but currently not very painful.  Primary osteoarthritis of both knees - Viscosupplement injections October 2023.  She had good range of motion of bilateral knee joints.  She continues to have some discomfort in her knees.  Primary osteoarthritis of both feet - Followed by Dr. Magdalen.  Patient states she gets cortisone injections to her MTP joints.  DDD (degenerative disc disease), cervical-she had limited right lateral rotation with some discomfort.  DDD (degenerative disc disease), thoracic - 07/2023 Laminectomy thoracic spine T11-T12  by Dr. Mavis.  She continues to have thoracic pain.  Degeneration of intervertebral disc of lumbar region without discogenic back pain or lower extremity pain - Followed by Dr. Mavis.  She continues to have lower back pain.  She goes to Dr. Lovorn for pain management.  Raynaud's syndrome without gangrene-currently not active.  Age-related osteoporosis without current pathological fracture - Followed by Dr. Tommas.  She is on Prolia  60 mg subcu every 6 months.  Chronic pain syndrome - She is under care of Dr. Lovorn.  Esophageal motility disorder  History of gastroesophageal reflux (GERD)  Cyclic vomiting syndrome-patient reports vomiting every 6 weeks.  Collagenous colitis  Essential hypertension-blood pressure was normal today.  Adrenal insufficiency (HCC)-followed by endocrinology.  Diabetic peripheral neuropathy associated with type 2 diabetes mellitus (HCC)  Orders: Orders Placed This Encounter   Procedures   Hemoglobin A1c   QuantiFERON-TB Gold Plus   Meds ordered this encounter  Medications   leflunomide  (ARAVA ) 20 MG tablet    Sig: Take 1 tablet (20 mg total) by mouth daily.     Follow-Up Instructions: Return in about 3 months (around 01/30/2024) for Psoriatic arthritis.   Maya Nash, MD  Note - This record has been created using Animal nutritionist.  Chart creation errors have been sought, but may not always  have been located. Such creation errors do not reflect on  the standard of medical care.

## 2023-10-19 ENCOUNTER — Other Ambulatory Visit: Payer: Self-pay | Admitting: Physician Assistant

## 2023-10-21 NOTE — Telephone Encounter (Signed)
 Last Fill: 07/02/2023   Next Visit: 10/30/2023   Last Visit: 05/15/2023   Dx: not mentioned   Current Dose per office note on 05/15/2023: not mentioned   Okay to refill Topamax ?

## 2023-10-24 ENCOUNTER — Ambulatory Visit: Admitting: Internal Medicine

## 2023-10-25 ENCOUNTER — Other Ambulatory Visit: Payer: Self-pay | Admitting: Rheumatology

## 2023-10-25 DIAGNOSIS — L409 Psoriasis, unspecified: Secondary | ICD-10-CM

## 2023-10-25 NOTE — Telephone Encounter (Signed)
 Last Fill: 07/10/2023  Labs: 08/08/2023 . WBC 10.7 Sodium 132 CO2 20 Glucose 196   Next Visit: 10/30/2023  Last Visit: 05/15/2023  DX: Psoriatic arthritis   Current Dose per office note 05/15/2023: Otezla  30 mg 1 tablet by mouth twice daily   Okay to refill Otezla ?

## 2023-10-30 ENCOUNTER — Encounter: Payer: Self-pay | Admitting: Rheumatology

## 2023-10-30 ENCOUNTER — Ambulatory Visit: Payer: PPO | Attending: Rheumatology | Admitting: Rheumatology

## 2023-10-30 VITALS — BP 129/71 | HR 74 | Resp 14 | Ht <= 58 in | Wt 120.0 lb

## 2023-10-30 DIAGNOSIS — M5030 Other cervical disc degeneration, unspecified cervical region: Secondary | ICD-10-CM | POA: Diagnosis not present

## 2023-10-30 DIAGNOSIS — M5134 Other intervertebral disc degeneration, thoracic region: Secondary | ICD-10-CM

## 2023-10-30 DIAGNOSIS — M51369 Other intervertebral disc degeneration, lumbar region without mention of lumbar back pain or lower extremity pain: Secondary | ICD-10-CM

## 2023-10-30 DIAGNOSIS — M533 Sacrococcygeal disorders, not elsewhere classified: Secondary | ICD-10-CM

## 2023-10-30 DIAGNOSIS — M810 Age-related osteoporosis without current pathological fracture: Secondary | ICD-10-CM | POA: Diagnosis not present

## 2023-10-30 DIAGNOSIS — M170 Bilateral primary osteoarthritis of knee: Secondary | ICD-10-CM

## 2023-10-30 DIAGNOSIS — E2740 Unspecified adrenocortical insufficiency: Secondary | ICD-10-CM

## 2023-10-30 DIAGNOSIS — I7300 Raynaud's syndrome without gangrene: Secondary | ICD-10-CM | POA: Diagnosis not present

## 2023-10-30 DIAGNOSIS — M19072 Primary osteoarthritis, left ankle and foot: Secondary | ICD-10-CM

## 2023-10-30 DIAGNOSIS — Z79899 Other long term (current) drug therapy: Secondary | ICD-10-CM

## 2023-10-30 DIAGNOSIS — M19071 Primary osteoarthritis, right ankle and foot: Secondary | ICD-10-CM | POA: Diagnosis not present

## 2023-10-30 DIAGNOSIS — K224 Dyskinesia of esophagus: Secondary | ICD-10-CM

## 2023-10-30 DIAGNOSIS — Z8719 Personal history of other diseases of the digestive system: Secondary | ICD-10-CM

## 2023-10-30 DIAGNOSIS — M7061 Trochanteric bursitis, right hip: Secondary | ICD-10-CM

## 2023-10-30 DIAGNOSIS — R1115 Cyclical vomiting syndrome unrelated to migraine: Secondary | ICD-10-CM

## 2023-10-30 DIAGNOSIS — L4050 Arthropathic psoriasis, unspecified: Secondary | ICD-10-CM | POA: Diagnosis not present

## 2023-10-30 DIAGNOSIS — G8929 Other chronic pain: Secondary | ICD-10-CM

## 2023-10-30 DIAGNOSIS — E1142 Type 2 diabetes mellitus with diabetic polyneuropathy: Secondary | ICD-10-CM

## 2023-10-30 DIAGNOSIS — I10 Essential (primary) hypertension: Secondary | ICD-10-CM

## 2023-10-30 DIAGNOSIS — G894 Chronic pain syndrome: Secondary | ICD-10-CM

## 2023-10-30 DIAGNOSIS — L409 Psoriasis, unspecified: Secondary | ICD-10-CM | POA: Diagnosis not present

## 2023-10-30 DIAGNOSIS — K52831 Collagenous colitis: Secondary | ICD-10-CM

## 2023-10-30 DIAGNOSIS — M7062 Trochanteric bursitis, left hip: Secondary | ICD-10-CM

## 2023-10-30 DIAGNOSIS — M65949 Unspecified synovitis and tenosynovitis, unspecified hand: Secondary | ICD-10-CM

## 2023-10-30 MED ORDER — LEFLUNOMIDE 20 MG PO TABS: 20.0000 mg | ORAL_TABLET | Freq: Every day | ORAL | Status: DC

## 2023-10-30 NOTE — Patient Instructions (Signed)
 Standing Labs We placed an order today for your standing lab work.   Please have your standing labs drawn in August and every 3 months  Please have your labs drawn 2 weeks prior to your appointment so that the provider can discuss your lab results at your appointment, if possible.  Please note that you may see your imaging and lab results in MyChart before we have reviewed them. We will contact you once all results are reviewed. Please allow our office up to 72 hours to thoroughly review all of the results before contacting the office for clarification of your results.  WALK-IN LAB HOURS  Monday through Thursday from 8:00 am -12:30 pm and 1:00 pm-4:30 pm and Friday from 8:00 am-12:00 pm.  Patients with office visits requiring labs will be seen before walk-in labs.  You may encounter longer than normal wait times. Please allow additional time. Wait times may be shorter on  Monday and Thursday afternoons.  We do not book appointments for walk-in labs. We appreciate your patience and understanding with our staff.   Labs are drawn by [facility]. Please bring your co-pay at the time of your lab draw.  You may receive a bill from [facility] for your lab work.  Please note if you are on Hydroxychloroquine and and an order has been placed for a Hydroxychloroquine level,  you will need to have it drawn 4 hours or more after your last dose.  If you wish to have your labs drawn at another location, please call the office 24 hours in advance so we can fax the orders.  The office is located at [address], [address], [place], [place] [address]   If you have any questions regarding directions or hours of operation,  please call [phone].   As a reminder, please drink plenty of water prior to coming for your lab work. Thanks!   Vaccines You are taking a medication(s) that can suppress your immune system.  The following immunizations are recommended: Flu annually Covid-19  Td/Tdap (tetanus,  diphtheria, pertussis) every 10 years Pneumonia (Prevnar 15 then Pneumovax 23 at least 1 year apart.  Alternatively, can take Prevnar 20 without needing additional dose) Shingrix: 2 doses from 4 weeks to 6 months apart  Please check with your PCP to make sure you are up to date.   If you have signs or symptoms of an infection or start antibiotics: First, call your PCP for workup of your infection. Hold your medication through the infection, until you complete your antibiotics, and until symptoms resolve if you take the following: Injectable medication (Actemra, Benlysta, Cimzia, Cosentyx, Enbrel, Humira, Kevzara, Orencia, Remicade, Simponi, Stelara, Taltz, Tremfya) Methotrexate  Leflunomide (Arava) Mycophenolate (Cellcept) Xeljanz, Olumiant, or Rinvoq

## 2023-10-31 ENCOUNTER — Ambulatory Visit: Admitting: Gastroenterology

## 2023-10-31 ENCOUNTER — Encounter: Payer: Self-pay | Admitting: Gastroenterology

## 2023-10-31 VITALS — BP 129/69 | HR 96 | Temp 97.9°F | Ht <= 58 in | Wt 118.2 lb

## 2023-10-31 DIAGNOSIS — R112 Nausea with vomiting, unspecified: Secondary | ICD-10-CM

## 2023-10-31 DIAGNOSIS — K219 Gastro-esophageal reflux disease without esophagitis: Secondary | ICD-10-CM | POA: Diagnosis not present

## 2023-10-31 DIAGNOSIS — K224 Dyskinesia of esophagus: Secondary | ICD-10-CM

## 2023-10-31 DIAGNOSIS — R1031 Right lower quadrant pain: Secondary | ICD-10-CM

## 2023-10-31 DIAGNOSIS — R197 Diarrhea, unspecified: Secondary | ICD-10-CM | POA: Diagnosis not present

## 2023-10-31 DIAGNOSIS — R1010 Upper abdominal pain, unspecified: Secondary | ICD-10-CM

## 2023-10-31 DIAGNOSIS — R5383 Other fatigue: Secondary | ICD-10-CM

## 2023-10-31 NOTE — Progress Notes (Unsigned)
 GI Office Note    Referring Provider: Sheryle Carwin, MD Primary Care Physician:  Sheryle Carwin, MD Primary Gastroenterologist: Carlin POUR. Cindie, DO  Date:  10/31/2023  ID:  [name], DOB 03-Sep-1950, MRN [id number]  Chief Complaint   Chief Complaint  Patient presents with   Follow-up    Having some issues with abd pain   History of Present Illness  Tonya Hall is a 73 y.o. female with a history of shingles, Raynaud's, psoriatic arthritis, osteoporosis, lymphocytic colitis, HLD, HTN, GERD, asthma, and diabetes presenting today with complaint of intermittent abdominal pain and nausea.   Colonoscopy 04/2016: -distal ileum norma -internal/external hemorrhoids -repeat colonoscopy 10 years.   EGD 11/2022: -small hiatal hernia -mild Schatzki ring s/p dilation -gastritis, no h.pylori   RUQ US  02/21/2023 with hepatic steatosis otherwise unremarkable.   HIDA scan 03/22/2023 IMPRESSION: - Patent cystic and common bile ducts. - Elevated gallbladder ejection fraction as can be seen with gallbladder hyperkinesia.   Underwent cholecystectomy January 2025. Path with chronic cholecystitis.    MRI/MRCP 06/17/2023 IMPRESSION: - Status post cholecystectomy. - No biliary dilatation. No calculi or other obstruction identified.   CT A/P 08/08/2023 IMPRESSION: - No specific abnormality is identified to explain the patient's right lower quadrant pain. - Mildly mobile cecum in the right upper abdomen - evidence of cholecystectomy.  - Small to moderate-sized hiatal hernia. - Scattered colonic diverticula. - Progressive healing fractures of the left pubic rami. - Late phase healing of a left lower rib fracture.   Alpha gal panel neg 5/19.   OV 08/21/23.  Reportedly had been doing well and back to baseline up until her ER visit on 5/8 with nausea, vomiting, right lower quadrant abdominal pain.  Mild leukocytosis noted on labs, mild hyponatremia.  Normal LFTs on ED visit.  Cyclical vomiting  syndrome remains on differential s/p cholecystectomy.  Advised to continue pantoprazole  twice daily and trial L-carnitine and coenzyme Q 10, follow-up in 6 to 8 weeks.  Last office visit 09/12/23. Urgent visit for severe recurrent right lower quadrant abdominal pain with vomiting.  She had been out of town for a wedding and got in late the evening prior and was assisting her sister for surgery.  She had eaten some nuts and then sat down and had a protein shake for breakfast Introl mix while sitting with her sister for surgery and then at lunchtime had a cheeseburger and fries for lunch but did not eat the bread.  After taking her sister home she went to sit down and then began having pain and vomiting.  States her vomit looked like Coke which she had drink the night before prior to going to bed.  Noted a history of diverticulitis once in the past and reports he has had recurrent vomiting on and off for a month and a half and had been taking L-carnitine and co-Q10.  Not having any appetite issues but was afraid to eat.  Does note that each time sound like this is happen she is eaten something different than she normally would.  Had been on nortriptyline  for about 2 years and was previously on Cymbalta  which caused severe diarrhea.  Denies any constipation.  Unsure if she is lactose intolerant.  No issues with weight.  Taking pantoprazole  twice daily for reflux.   10/07/23: Reportedly had been sick for 2-3 days and then was vomiting 1-2 times every day.  Also having mild loose stools since the Thursday prior without major pain and only eating  small amounts, only vomiting liquids and not food.  She states no Zofran  was available at [facility].  She eventually requested Phenergan  given she did not like the Zofran  was really helping the nausea or the knot in her stomach.  Having 4-5 stools daily with some solid pieces and not pure water .  No fever.  She increase the nortriptyline  to 2 and was tolerating it  well.  Phenergan  was sent in for her, discussed possible side effects.  Today:  Discussed the use of AI scribe software for clinical note transcription with the patient, who gave verbal consent to proceed.  She experiences recurrent episodes of nausea and vomiting, with the most recent episode lasting almost two weeks. During these episodes, she feels a sensation of 'a rock sitting' in her epigastric region, which she associates with nausea rather than pain. Her appetite decreases, and she has difficulty eating, often resorting to small meals like a banana, yogurt, or a protein shake. Fatigue and weakness follow these episodes, lasting for several days. Occasional diarrhea occurs during these episodes, with one or two loose stools per day, resolving after the nausea subsides. Stools are softer than normal but not watery.  She describes a recent episode of acute right lower quadrant abdominal pain that occurred suddenly and lasted for about three hours, reaching an intensity of 'nine going on twelve'. The pain was not associated with any specific activity and was somewhat relieved by applying ice. She did not seek emergency care as the pain eventually subsided.  She has a history of esophageal dysmotility and reports increased difficulty swallowing, particularly with pills, requiring her to wait longer between swallows. She is currently taking pantoprazole  40 mg twice daily, CoQ10, carnitine, and nortriptyline . She has tried omeprazole  in the past but was switched to pantoprazole  due to increased symptoms. Phenergan  has been helpful in managing her nausea, although she tries to limit its use. Zofran  is less effective for her symptoms. No heartburn or burning sensations, but she occasionally uses Rolaids for acid reflux, which has been occurring more frequently, about once a week.  Wt Readings from Last 8 Encounters:  10/31/23 118 lb 3.2 oz (53.6 kg)  10/30/23 120 lb (54.4 kg)  09/23/23 121 lb (54.9 kg)   09/12/23 116 lb 12.8 oz (53 kg)  08/21/23 120 lb 9.6 oz (54.7 kg)  08/08/23 117 lb 15.1 oz (53.5 kg)  07/24/23 118 lb (53.5 kg)  07/18/23 118 lb (53.5 kg)    Current Outpatient Medications  Medication Sig Dispense Refill   albuterol  (PROVENTIL  HFA;VENTOLIN  HFA) 108 (90 BASE) MCG/ACT inhaler Inhale 2 puffs into the lungs every 6 (six) hours as needed for wheezing or shortness of breath.      aspirin  81 MG tablet Take 81 mg by mouth daily.     Blood Glucose Monitoring Suppl (ONETOUCH VERIO FLEX SYSTEM) w/Device KIT      cetirizine (ZYRTEC) 10 MG tablet Take 10 mg by mouth daily as needed (allergies).     chlorhexidine  (PERIDEX ) 0.12 % solution Use as directed 15 mLs in the mouth or throat 2 (two) times daily. (Patient taking differently: Use as directed 15 mLs in the mouth or throat as needed.)     cholecalciferol (VITAMIN D3) 25 MCG (1000 UNIT) tablet Take 1,000 Units by mouth at bedtime.     Coenzyme Q10 400 MG CAPS as directed Orally     denosumab  (PROLIA ) 60 MG/ML SOSY injection as directed Subcutaneous     empagliflozin  (JARDIANCE ) 25 MG TABS  tablet Take 25 mg by mouth daily.     fish oil-omega-3 fatty acids  1000 MG capsule Take 2 capsules (2 g total) by mouth daily. 2-4 GRAMS DAILY     fluorouracil (EFUDEX) 5 % cream 1 Application.     gabapentin  (NEURONTIN ) 600 MG tablet TAKE ONE TABLET BY MOUTH THREE TIMES DAILY. 90 tablet 5   Ginger , Zingiber officinalis, (GINGER  ROOT) 500 MG CAPS Take 500 mg by mouth daily.     Glucosamine Sulfate 1000 MG TABS Take 2,000 mg by mouth daily.     HYDROcodone -acetaminophen  (NORCO) 7.5-325 MG tablet Take 1 tablet by mouth every 6 (six) hours as needed for moderate pain (pain score 4-6) or severe pain (pain score 7-10). For chronic pain 90 tablet 0   hydrocortisone  sodium succinate  (SOLU-CORTEF ) 100 MG SOLR injection Inject 100 mg into the muscle daily as needed (when unable to keep down prednisone  tablet).      Lancets (ONETOUCH DELICA PLUS LANCET33G)  MISC USE TO TEST 3 TIMESUDAILY.     leflunomide  (ARAVA ) 20 MG tablet Take 1 tablet (20 mg total) by mouth daily.     Levocarnitine 500 MG TABS 1 tablet Orally Twice a day     metoprolol  succinate (TOPROL -XL) 50 MG 24 hr tablet Take 1 tablet (50 mg total) by mouth daily. Take with or immediately following a meal.     Misc Natural Products (TART CHERRY ADVANCED PO) Take 3,000 mg by mouth daily. daily     Multiple Vitamin (MULTIVITAMIN) tablet Take 1 tablet by mouth 3 (three) times a week.     nortriptyline  (PAMELOR ) 25 MG capsule TAKE (1) CAPSULE BY MOUTH AT BEDTIME. (Patient taking differently: 50 mg.) 30 capsule 0   olmesartan (BENICAR) 40 MG tablet Take 40 mg by mouth daily.     ondansetron  (ZOFRAN -ODT) 4 MG disintegrating tablet Take 1 tablet (4 mg total) by mouth every 8 (eight) hours as needed for nausea. 30 tablet 1   ONETOUCH VERIO test strip      OTEZLA  30 MG TABS TAKE ONE TABLET BY MOUTH TWICE DAILY 180 tablet 0   oxybutynin  (DITROPAN -XL) 5 MG 24 hr tablet Take 1 tablet (5 mg total) by mouth at bedtime. 90 tablet 3   pantoprazole  (PROTONIX ) 40 MG tablet TAKE (1) TABLET BY MOUTH TWICE DAILY. 180 tablet 3   Polyethyl Glycol-Propyl Glycol (SYSTANE OP) Place 1 drop into both eyes 4 (four) times daily as needed (dry eyes).     potassium chloride  SA (KLOR-CON  M) 20 MEQ tablet Take 20 mEq by mouth daily.     predniSONE  (DELTASONE ) 5 MG tablet TAKE 1 TABLET BY MOUTH DAILY WITH BREAKFAST 90 tablet 0   promethazine  (PHENERGAN ) 12.5 MG tablet Take 1 tablet (12.5 mg total) by mouth every 8 (eight) hours as needed for nausea or vomiting. Only take in between zofran  doses. 30 tablet 0   spironolactone  (ALDACTONE ) 25 MG tablet Take 25 mg by mouth daily.     TALTZ  80 MG/ML pen INJECT 80MG  (1 PEN) UNDER THE SKIN EVERY 28 DAYS 3 mL 0   topiramate  (TOPAMAX ) 200 MG tablet TAKE (1) TABLET BY MOUTH ONCE AT BEDTIME. 90 tablet 0   Turmeric 500 MG CAPS Take 500 mg by mouth every morning.     No current  facility-administered medications for this visit.    Past Medical History:  Diagnosis Date   Asthma    Albuterol  in haler prn   Cataract    immature unsure which eye  Chronic back pain    COVID-19 October/November 2020   Degenerative disk disease    psoriatic   Diabetes mellitus without complication (HCC)    diet controlled   Elevated liver enzymes    r/t leflunamide (this medication is put on hold intermittently due to this)   Esophageal motility disorder    Non-specific, see modified barium study/speech path, BP   GERD (gastroesophageal reflux disease)    takes Protonix  bid   History of blood transfusion    post c-section   History of bronchitis    History of hiatal hernia    HTN (hypertension)    Hyperlipidemia    Lymphocytic colitis 05/26/2010   Responded to Entocort x 3 MOS   Neuropathy    Nonallergic rhinitis    Osteoporosis    gets Prolia  every 6 months   Peripheral edema    Pneumonia 02/2019   Psoriatic arthritis (HCC)    Dr. Elin   Psoriatic arthritis [facility])    bilateral hands   Raynaud's disease    Seasonal allergies    Shingles 2020   SUI (stress urinary incontinence, female)    Urinary urgency     Past Surgical History:  Procedure Laterality Date   ANTERIOR CERVICAL DECOMP/DISCECTOMY FUSION N/A 03/09/2020   Procedure: ANTERIOR CERVICAL DECOMPRESSION/DISCECTOMY FUSION, INTERBODY PROSTHESIS, PLATE SCREWS CERVICAL FIVE-SIX, CERVICAL SIX-SEVEN;  Surgeon: Mavis Purchase, MD;  Location: [facility] OR;  Service: Neurosurgery;  Laterality: N/A;   BACK SURGERY  2003   BALLOON DILATION N/A 11/12/2022   Procedure: BALLOON DILATION;  Surgeon: Cindie Carlin POUR, DO;  Location: [facility];  Service: Endoscopy;  Laterality: N/A;   BIOPSY  04/17/2016   Procedure: BIOPSY;  Surgeon: Margo LITTIE Haddock, MD;  Location: [facility];  Service: Endoscopy;;  random colon bx's   BIOPSY  11/12/2022   Procedure: BIOPSY;  Surgeon: Cindie Carlin POUR, DO;  Location: [facility];  Service: Endoscopy;;   CERVICAL SPINE SURGERY  09/09/2017   CESAREAN SECTION  1974, 1978       CHOLECYSTECTOMY  04/2023   COLONOSCOPY  06/19/2008   DOQ:dfjoo internal hemorrhoids/mild sigmoin colon diverticulosis   COLONOSCOPY  2012   Dr. Shaaron: normal rectum, diverticula, lymphocytic colitis    COLONOSCOPY WITH PROPOFOL  N/A 04/17/2016   Dr. Haddock: Normal terminal ileum, internal/external hemorrhoids, random colon biopsies consistent with collagenous colitis   DILATION AND CURETTAGE OF UTERUS  1977   spontaneous abortion   ESOPHAGOGASTRODUODENOSCOPY  06/19/2008   DOQ:fnizmjuz gastritis   ESOPHAGOGASTRODUODENOSCOPY (EGD) WITH PROPOFOL  N/A 11/12/2022   Procedure: ESOPHAGOGASTRODUODENOSCOPY (EGD) WITH PROPOFOL ;  Surgeon: Cindie Carlin POUR, DO;  Location: [facility];  Service: Endoscopy;  Laterality: N/A;  8:30 am, asa 3   LUMBAR DISC ARTHROPLASTY  09/1996   LUMBAR FUSION  6/03, 11/08, 11/14   L4-5 fusion, L2-3 fusion, L1-2   LUMBAR FUSION  08/03/2020   L4-L5   LUMBAR LAMINECTOMY  03/2020   MOUTH SURGERY  12/07/2021   oral surgery   ODONTOID SCREW INSERTION N/A 01/27/2018   Procedure: ODONTOID SCREW INSERTION;  Surgeon: Mavis Purchase, MD;  Location: [facility] OR;  Service: Neurosurgery;  Laterality: N/A;  ODONTOID SCREW INSERTION   THORACIC DISCECTOMY N/A 07/18/2023   Procedure: THORACIC DISCECTOMY THORACIC ELEVEN-TWELVE DISCECTOMY;  Surgeon: Mavis Purchase, MD;  Location: [facility] OR;  Service: Neurosurgery;  Laterality: N/A;  T11-T   TONSILLECTOMY     TONSILLECTOMY AND ADENOIDECTOMY     TUBAL LIGATION      Family History  Problem  Relation Age of Onset   Diabetes Mother    Pancreatitis Mother    Arthritis Mother        psoriatic    Fibromyalgia Mother    Rheumatic fever Father    Diabetes Other        grandparents   Skin cancer Other        grandfather   Hypertension Other        grandparent   Congestive Heart Failure Other        grandfather   Parkinson's disease  Other        grandmother   Colon cancer Maternal Uncle    Fibromyalgia Sister    Rheum arthritis Sister    Diabetes Sister    Fibromyalgia Sister    Diabetes Sister    Rheum arthritis Sister    Hypertension Son    Colon polyps Neg Hx     Allergies as of 10/31/2023 - Review Complete 10/31/2023  Allergen Reaction Noted   Adalimumab Rash and Other (See Comments) 05/09/2021   Imuran [azathioprine sodium] Rash 07/03/2011   Methotrexate Other (See Comments) 05/09/2021   Plaquenil [hydroxychloroquine sulfate] Rash 07/03/2011   Sulfonamide derivatives Rash    Ceftin [cefuroxime axetil] Nausea Only 07/06/2014   Lisinopril Cough 09/22/2021   Sulfamethoxazole-trimethoprim  09/22/2021   Morphine Nausea And Vomiting     Social History   Socioeconomic History   Marital status: Married    Spouse name: Not on file   Number of children: 2   Years of education: Not on file   Highest education level: Not on file  Occupational History   Occupation: Case Midwife    Employer: [employer]    Comment: Estate manager/land agent  Tobacco Use   Smoking status: Never    Passive exposure: Never   Smokeless tobacco: Never  Vaping Use   Vaping status: Never Used  Substance and Sexual Activity   Alcohol  use: No   Drug use: No   Sexual activity: Not Currently    Partners: Male    Birth control/protection: Surgical    Comment: BTL  Other Topics Concern   Not on file  Social History Narrative   ATTENDS [employer]. 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   Social Drivers of Corporate investment banker Strain: Not on file  Food Insecurity: No Food Insecurity (07/18/2023)   Hunger Vital Sign    Worried About Running Out of Food in the Last Year: Never true    Ran Out of Food in the Last Year: Never true  Transportation Needs: No Transportation Needs (07/18/2023)   PRAPARE - Administrator, Civil Service (Medical): No    Lack of Transportation (Non-Medical): No  Physical Activity: Not on file   Stress: Not on file  Social Connections: Socially Integrated (07/18/2023)   Social Connection and Isolation Panel    Frequency of Communication with Friends and Family: More than three times a week    Frequency of Social Gatherings with Friends and Family: Three times a week    Attends Religious Services: More than 4 times per year    Active Member of Clubs or Organizations: Yes    Attends Banker Meetings: More than 4 times per year    Marital Status: Married     Review of Systems   Gen: Denies fever, chills, anorexia. Denies fatigue, weakness, weight loss.  CV: Denies chest pain, palpitations, syncope, peripheral edema, and claudication. Resp: Denies dyspnea at rest, cough, wheezing, coughing  up blood, and pleurisy. GI: See HPI Derm: Denies rash, itching, dry skin Psych: Denies depression, anxiety, memory loss, confusion. No homicidal or suicidal ideation.  Heme: Denies bruising, bleeding, and enlarged lymph nodes.  Physical Exam   BP 129/69 (BP Location: Right Arm, Patient Position: Sitting, Cuff Size: Normal)   Pulse 96   Temp 97.9 F (36.6 C) (Oral)   Ht 4' 10 (1.473 m)   Wt 118 lb 3.2 oz (53.6 kg)   LMP 02/01/1999   SpO2 97%   BMI 24.70 kg/m   General:   Alert and oriented. No distress noted. Pleasant and cooperative.  Head:  Normocephalic and atraumatic. Eyes:  Conjuctiva clear without scleral icterus. Mouth:  Oral mucosa pink and moist. Good dentition. No lesions. Lungs:  Clear to auscultation bilaterally. No wheezes, rales, or rhonchi. No distress.  Heart:  S1, S2 present without murmurs appreciated.  Abdomen:  +BS, soft, non-distended.  Epigastric tenderness.  No rebound or guarding. No HSM or masses noted. Rectal: deferred Msk:  Symmetrical without gross deformities. Normal posture. Extremities:  Without edema. Neurologic:  Alert and  oriented x4 Psych:  Alert and cooperative. Normal mood and affect.  Assessment & Plan  Tonya BISIG is a  73 y.o. female presenting today with chronic nausea with intermittent vomiting and RLQ pain.  Chronic nausea and vomiting with associated diarrhea and fatigue Intermittent symptoms with epigastric heaviness, nausea, and occasional diarrhea. Symptoms not consistent with IBS. Differential includes gastroparesis, but less likely than gastritis or reflux. Previous CT showed mobile cecum, possibly contributing to symptoms. Phenergan  effective for nausea. - Discontinue pantoprazole  and start Voquezna 20 mg once daily on an empty stomach for two weeks. - Provided samples of Voquezna for two weeks. - Consider gastric emptying study if symptoms persist after Voquezna trial. - Continue Phenergan  as needed for nausea, monitor for side effects. - Continue nortriptyline  50 mg nightly.  - Continue L carnitine and CoQ-10 daily.  - Advise use of heat or ice for symptomatic relief of right lower quadrant pain. - Instruct to seek emergency care if pain becomes unbearable, lasts longer than several hours, or is accompanied by obstruction symptoms.  Gastroesophageal reflux disease (GERD) Worsening symptoms with occasional acid reflux requiring Rolaids. Current pantoprazole  treatment may be insufficient. Voquezna considered as alternative for severe reflux symptoms. - Start Voquezna 20 mg once daily on an empty stomach for two weeks. - Discontinue pantoprazole  during Voquezna trial. - Evaluate effectiveness of Voquezna after two weeks and consider prescription if effective. - Discussed carafate trial if no improvement with voquezna.   Esophageal dysmotility Increased difficulty swallowing pills and food, with sensation of pills not passing easily and needing more time between swallows.  Right lower quadrant abdominal pain due to mobile cecum Intermittent pain likely due to mobile cecum as noted on previous imaging. Pain episodes last several hours and resolve spontaneously. Surgery is definitive but not  preferred. Pain management with heat or ice is effective. - Advise use of heat or ice for pain management. - Instruct to seek emergency care if pain becomes unbearable, lasts longer than several hours, or is accompanied by obstruction symptoms  Follow up   Follow up with Dr. Cindie in 2-3 months.     Charmaine Melia, MSN, FNP-BC, AGACNP-BC [facility]

## 2023-10-31 NOTE — Patient Instructions (Addendum)
 Continue your nortriptyline  50 mg nightly and your L-carnitine and co-Q10 daily.  We will trial Voquezna 20 mg tablets.  You will take 1 tablet once daily on empty stomach.  While you are doing this stop your pantoprazole .  You may continue to use Zofran  and Phenergan  as needed.  As we discussed I feel like your right lower quadrant pain is secondary to a mobile cecum and we need to monitor for any signs of obstruction including severe abdominal pain that does not stop, lack of bowel movements, or nausea/vomiting does not stop for long period of time.  At that point have recommended ED evaluation.  We have the option to perform a gastric emptying study if you are having worsening nausea or trying Carafate as I feel like this is uncontrolled gastritis or functional dyspepsia contributing to your nausea and upper abdominal pain.  We will have you follow-up in 2-3 months.  Please call me with a progress report in 2-3 weeks to let me know how you are doing with the samples.  It was a pleasure to see you today. I want to create trusting relationships with patients. If you receive a survey regarding your visit,  I greatly appreciate you taking time to fill this out on paper or through your MyChart. I value your feedback.  Charmaine Melia, MSN, FNP-BC, AGACNP-BC [facility]

## 2023-11-03 ENCOUNTER — Encounter: Payer: Self-pay | Admitting: Gastroenterology

## 2023-11-11 ENCOUNTER — Ambulatory Visit (HOSPITAL_COMMUNITY)

## 2023-11-15 ENCOUNTER — Telehealth: Payer: Self-pay

## 2023-11-15 MED ORDER — HYDROCODONE-ACETAMINOPHEN 7.5-325 MG PO TABS: 1.0000 | ORAL_TABLET | Freq: Four times a day (QID) | ORAL | 0 refills | Status: DC | PRN

## 2023-11-15 NOTE — Telephone Encounter (Signed)
 Patient calling for refill on Hydro/APAP refill. [facility] Next appt 11/20/23

## 2023-11-15 NOTE — Telephone Encounter (Signed)
 P called again she said this was her third time calling in past week for refill of hydrocodone   [facility]

## 2023-11-18 ENCOUNTER — Ambulatory Visit (HOSPITAL_COMMUNITY)
Admission: RE | Admit: 2023-11-18 | Discharge: 2023-11-18 | Disposition: A | Discharge status: Home / Self Care | Source: Ambulatory Visit | Attending: Obstetrics & Gynecology | Admitting: Obstetrics & Gynecology

## 2023-11-18 DIAGNOSIS — Z1231 Encounter for screening mammogram for malignant neoplasm of breast: Secondary | ICD-10-CM | POA: Insufficient documentation

## 2023-11-19 DIAGNOSIS — M4804 Spinal stenosis, thoracic region: Secondary | ICD-10-CM | POA: Diagnosis not present

## 2023-11-19 DIAGNOSIS — Z6839 Body mass index (BMI) 39.0-39.9, adult: Secondary | ICD-10-CM | POA: Diagnosis not present

## 2023-11-20 ENCOUNTER — Encounter: Payer: Self-pay | Admitting: Physical Medicine and Rehabilitation

## 2023-11-20 ENCOUNTER — Telehealth: Payer: Self-pay

## 2023-11-20 ENCOUNTER — Encounter: Attending: Physical Medicine and Rehabilitation | Admitting: Physical Medicine and Rehabilitation

## 2023-11-20 VITALS — BP 145/76 | HR 80 | Ht <= 58 in | Wt 119.0 lb

## 2023-11-20 DIAGNOSIS — R1115 Cyclical vomiting syndrome unrelated to migraine: Secondary | ICD-10-CM | POA: Insufficient documentation

## 2023-11-20 DIAGNOSIS — L4050 Arthropathic psoriasis, unspecified: Secondary | ICD-10-CM | POA: Insufficient documentation

## 2023-11-20 DIAGNOSIS — G894 Chronic pain syndrome: Secondary | ICD-10-CM | POA: Diagnosis not present

## 2023-11-20 MED ORDER — OXYCODONE HCL 5 MG PO TABS: 5.0000 mg | ORAL_TABLET | Freq: Two times a day (BID) | ORAL | 0 refills | Status: DC | PRN

## 2023-11-20 MED ORDER — PROMETHAZINE HCL 25 MG PO TABS: 12.5000 mg | ORAL_TABLET | Freq: Four times a day (QID) | ORAL | 5 refills | Status: AC | PRN

## 2023-11-20 MED ORDER — DIAZEPAM 5 MG PO TABS: 5.0000 mg | ORAL_TABLET | Freq: Four times a day (QID) | ORAL | 2 refills | Status: DC | PRN

## 2023-11-20 MED ORDER — NALOXONE HCL 4 MG/0.1ML NA LIQD: 1.0000 | Freq: Once | NASAL | 5 refills | Status: AC

## 2023-11-20 MED ORDER — SUMATRIPTAN SUCCINATE 100 MG PO TABS: 100.0000 mg | ORAL_TABLET | ORAL | 5 refills | Status: DC | PRN

## 2023-11-20 NOTE — Progress Notes (Signed)
 Subjective:    Patient ID: Tonya Hall Blood, female    DOB: [birth year], 73 y.o.   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  L hip bursitis and also has SI joint pain more on L- as well as mid thoracic acute on chronic pain.   Here for f/u on chronic pain from back and psoriatic arthritis.    Is 4 ft 10 inches. Difficulty finding clothes.   Pain is somewhat better- since surgery in April- has a laminectomy- T12-L1- was done- not a fusion Still has some thoracic pain-  but improved- but at level Everetts pain gone  Still has L SI pain and trochanteric bursitis.   Rare, but LLE- was hurting so bad, sciatic pain- but needed pain meds required to sleep.  Also took Valium  1/2 dose that night- 2.5 mg- rarely used.   Norco 7.5 mg- not more than 2x/day- every now and then 3 pills/day max- is helpful- takes first thing in AM- cannot really move if doesn't take one- takes the other one mid afternoon. And rarely at night. Takes the edge off enough to get things done.    Cannot work more than 15-60 minutes and then has to sit on heating pad 15-60 minutes to compensate.   Cyclic vomiting- every so often- in bed when it occurs- 1 week of vomiting- they don't know why- for 1+ year.s  Took Gallbladder out 1/25- better for 2-2.5 months- but then cyclic vomiting came back-  Been to GI several times.  Put on CoQ10- L-Carnitine-  Increased Pamelor  50 mg QHS For nausea- phenergan  12.5 mg and Zofran -  Can take each one q8 hours- prn. Didn't want to give her 25 or q6 hours.  Never tried Reglan.  Doesn't get back up/constipated.  Hasn't tried the Valium  for it. Has a knot in epigastric area and it's hard.  Feels like staying in bed most of time  Sometimes swimmy head.  No side effects form phenergan  at all   Of  note, sister has severe migraines and other sister sounds like it is as well - never had HA Never has stroke- drooping of mouth in last 6 months-  Wondering if had a minor stroke lately.  Vs necrotic jaw from tooth.   Pain Inventory Average Pain 6 Pain Right Now 2 My pain is intermittent, dull, and aching  In the last 24 hours, has pain interfered with the following? General activity 7 Relation with others 5 Enjoyment of life 8 What TIME of day is your pain at its worst? daytime Sleep (in general) Fair  Pain is worse with: walking, bending, standing, and some activites Pain improves with: rest, heat/ice, pacing activities, and medication Relief from Meds: 5  Family History  Problem Relation Age of Onset   Diabetes Mother    Pancreatitis Mother    Arthritis Mother        psoriatic    Fibromyalgia Mother    Rheumatic fever Father    Diabetes Other        grandparents   Skin cancer Other        grandfather   Hypertension Other        grandparent   Congestive Heart Failure Other        grandfather  Parkinson's disease Other        grandmother   Colon cancer Maternal Uncle    Fibromyalgia Sister    Rheum arthritis Sister    Diabetes Sister    Fibromyalgia Sister    Diabetes Sister    Rheum arthritis Sister    Hypertension Son    Colon polyps Neg Hx    Social History   Socioeconomic History   Marital status: Married    Spouse name: Not on file   Number of children: 2   Years of education: Not on file   Highest education level: Not on file  Occupational History   Occupation: Case Midwife    Employer: [employer]    Comment: Estate manager/land agent  Tobacco Use   Smoking status: Never    Passive exposure: Never   Smokeless tobacco: Never  Vaping Use   Vaping status: Never Used  Substance and Sexual Activity   Alcohol  use: No   Drug use: No   Sexual activity: Not Currently    Partners: Male    Birth control/protection: Surgical    Comment: BTL  Other Topics Concern    Not on file  Social History Narrative   ATTENDS [employer]. 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   Social Drivers of Corporate investment banker Strain: Not on file  Food Insecurity: No Food Insecurity (07/18/2023)   Hunger Vital Sign    Worried About Running Out of Food in the Last Year: Never true    Ran Out of Food in the Last Year: Never true  Transportation Needs: No Transportation Needs (07/18/2023)   PRAPARE - Administrator, Civil Service (Medical): No    Lack of Transportation (Non-Medical): No  Physical Activity: Not on file  Stress: Not on file  Social Connections: Socially Integrated (07/18/2023)   Social Connection and Isolation Panel    Frequency of Communication with Friends and Family: More than three times a week    Frequency of Social Gatherings with Friends and Family: Three times a week    Attends Religious Services: More than 4 times per year    Active Member of Clubs or Organizations: Yes    Attends Banker Meetings: More than 4 times per year    Marital Status: Married   Past Surgical History:  Procedure Laterality Date   ANTERIOR CERVICAL DECOMP/DISCECTOMY FUSION N/A 03/09/2020   Procedure: ANTERIOR CERVICAL DECOMPRESSION/DISCECTOMY FUSION, INTERBODY PROSTHESIS, PLATE SCREWS CERVICAL FIVE-SIX, CERVICAL SIX-SEVEN;  Surgeon: Mavis Purchase, MD;  Location: [facility] OR;  Service: Neurosurgery;  Laterality: N/A;   BACK SURGERY  2003   BALLOON DILATION N/A 11/12/2022   Procedure: BALLOON DILATION;  Surgeon: Cindie Carlin POUR, DO;  [facility];  Service: Endoscopy;  Laterality: N/A;   BIOPSY  04/17/2016   Procedure: BIOPSY;  Surgeon: Margo LITTIE Haddock, MD;  [facility];  Service: Endoscopy;;  random colon bx's   BIOPSY  11/12/2022   Procedure: BIOPSY;  Surgeon: Cindie Carlin POUR, DO;  [facility];  Service: Endoscopy;;   CERVICAL SPINE SURGERY  09/09/2017   CESAREAN SECTION  1974, 1978        CHOLECYSTECTOMY  04/2023   COLONOSCOPY  06/19/2008   DOQ:dfjoo internal hemorrhoids/mild sigmoin colon diverticulosis   COLONOSCOPY  2012   Dr. Shaaron: normal rectum, diverticula, lymphocytic colitis    COLONOSCOPY WITH PROPOFOL  N/A 04/17/2016   Dr. Haddock: Normal terminal ileum, internal/external hemorrhoids, random colon biopsies consistent with collagenous colitis  DILATION AND CURETTAGE OF UTERUS  1977   spontaneous abortion   ESOPHAGOGASTRODUODENOSCOPY  06/19/2008   DOQ:fnizmjuz gastritis   ESOPHAGOGASTRODUODENOSCOPY (EGD) WITH PROPOFOL  N/A 11/12/2022   Procedure: ESOPHAGOGASTRODUODENOSCOPY (EGD) WITH PROPOFOL ;  Surgeon: Cindie Carlin POUR, DO;  [facility];  Service: Endoscopy;  Laterality: N/A;  8:30 am, asa 3   LUMBAR DISC ARTHROPLASTY  09/1996   LUMBAR FUSION  6/03, 11/08, 11/14   L4-5 fusion, L2-3 fusion, L1-2   LUMBAR FUSION  08/03/2020   L4-L5   LUMBAR LAMINECTOMY  03/2020   MOUTH SURGERY  12/07/2021   oral surgery   ODONTOID SCREW INSERTION N/A 01/27/2018   Procedure: ODONTOID SCREW INSERTION;  Surgeon: Mavis Purchase, MD;  Location: [facility] OR;  Service: Neurosurgery;  Laterality: N/A;  ODONTOID SCREW INSERTION   THORACIC DISCECTOMY N/A 07/18/2023   Procedure: THORACIC DISCECTOMY THORACIC ELEVEN-TWELVE DISCECTOMY;  Surgeon: Mavis Purchase, MD;  Location: [facility] OR;  Service: Neurosurgery;  Laterality: N/A;  T11-T   TONSILLECTOMY     TONSILLECTOMY AND ADENOIDECTOMY     TUBAL LIGATION     Past Surgical History:  Procedure Laterality Date   ANTERIOR CERVICAL DECOMP/DISCECTOMY FUSION N/A 03/09/2020   Procedure: ANTERIOR CERVICAL DECOMPRESSION/DISCECTOMY FUSION, INTERBODY PROSTHESIS, PLATE SCREWS CERVICAL FIVE-SIX, CERVICAL SIX-SEVEN;  Surgeon: Mavis Purchase, MD;  Location: [facility] OR;  Service: Neurosurgery;  Laterality: N/A;   BACK SURGERY  2003   BALLOON DILATION N/A 11/12/2022   Procedure: BALLOON DILATION;  Surgeon: Cindie Carlin POUR, DO;  [facility];   Service: Endoscopy;  Laterality: N/A;   BIOPSY  04/17/2016   Procedure: BIOPSY;  Surgeon: Margo LITTIE Haddock, MD;  [facility];  Service: Endoscopy;;  random colon bx's   BIOPSY  11/12/2022   Procedure: BIOPSY;  Surgeon: Cindie Carlin POUR, DO;  [facility];  Service: Endoscopy;;   CERVICAL SPINE SURGERY  09/09/2017   CESAREAN SECTION  1974, 1978       CHOLECYSTECTOMY  04/2023   COLONOSCOPY  06/19/2008   DOQ:dfjoo internal hemorrhoids/mild sigmoin colon diverticulosis   COLONOSCOPY  2012   Dr. Shaaron: normal rectum, diverticula, lymphocytic colitis    COLONOSCOPY WITH PROPOFOL  N/A 04/17/2016   Dr. Haddock: Normal terminal ileum, internal/external hemorrhoids, random colon biopsies consistent with collagenous colitis   DILATION AND CURETTAGE OF UTERUS  1977   spontaneous abortion   ESOPHAGOGASTRODUODENOSCOPY  06/19/2008   DOQ:fnizmjuz gastritis   ESOPHAGOGASTRODUODENOSCOPY (EGD) WITH PROPOFOL  N/A 11/12/2022   Procedure: ESOPHAGOGASTRODUODENOSCOPY (EGD) WITH PROPOFOL ;  Surgeon: Cindie Carlin POUR, DO;  [facility];  Service: Endoscopy;  Laterality: N/A;  8:30 am, asa 3   LUMBAR DISC ARTHROPLASTY  09/1996   LUMBAR FUSION  6/03, 11/08, 11/14   L4-5 fusion, L2-3 fusion, L1-2   LUMBAR FUSION  08/03/2020   L4-L5   LUMBAR LAMINECTOMY  03/2020   MOUTH SURGERY  12/07/2021   oral surgery   ODONTOID SCREW INSERTION N/A 01/27/2018   Procedure: ODONTOID SCREW INSERTION;  Surgeon: Mavis Purchase, MD;  Location: [facility] OR;  Service: Neurosurgery;  Laterality: N/A;  ODONTOID SCREW INSERTION   THORACIC DISCECTOMY N/A 07/18/2023   Procedure: THORACIC DISCECTOMY THORACIC ELEVEN-TWELVE DISCECTOMY;  Surgeon: Mavis Purchase, MD;  Location: [facility] OR;  Service: Neurosurgery;  Laterality: N/A;  T11-T   TONSILLECTOMY     TONSILLECTOMY AND ADENOIDECTOMY     TUBAL LIGATION     Past Medical History:  Diagnosis Date   Asthma    Albuterol  in haler prn   Cataract  immature unsure  which eye   Chronic back pain    COVID-19 October/November 2020   Degenerative disk disease    psoriatic   Diabetes mellitus without complication (HCC)    diet controlled   Elevated liver enzymes    r/t leflunamide (this medication is put on hold intermittently due to this)   Esophageal motility disorder    Non-specific, see modified barium study/speech path, BP   GERD (gastroesophageal reflux disease)    takes Protonix  bid   History of blood transfusion    post c-section   History of bronchitis    History of hiatal hernia    HTN (hypertension)    Hyperlipidemia    Lymphocytic colitis 05/26/2010   Responded to Entocort x 3 MOS   Neuropathy    Nonallergic rhinitis    Osteoporosis    gets Prolia  every 6 months   Peripheral edema    Pneumonia 02/2019   Psoriatic arthritis (HCC)    Dr. Elin   Psoriatic arthritis [facility])    bilateral hands   Raynaud's disease    Seasonal allergies    Shingles 2020   SUI (stress urinary incontinence, female)    Urinary urgency    BP (!) 145/76   Pulse 80   Ht 4' 10 (1.473 m)   Wt 119 lb (54 kg)   LMP 02/01/1999   SpO2 98%   BMI 24.87 kg/m   Opioid Risk Score:   Fall Risk Score:  `1  Depression screen [facility] 2/9     09/23/2023   11:09 AM 07/24/2023    9:30 AM 05/21/2023    9:11 AM 03/20/2023    9:50 AM 02/20/2023    1:05 PM 12/13/2022   10:04 AM 09/19/2022   10:21 AM  Depression screen PHQ 2/9  Decreased Interest 0 0 0 0 0 0 0  Down, Depressed, Hopeless 0 0 0 0 0 0 0  PHQ - 2 Score 0 0 0 0 0 0 0      Review of Systems  Musculoskeletal:  Positive for back pain and gait problem.       B/L hand and foot pain Left hip pain  All other systems reviewed and are negative.      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  L hip bursitis and also has SI joint pain more on L- as well as mid thoracic acute on chronic pain.   Here for f/u on chronic pain from back and psoriatic arthritis.     Will change Phenergan  to 12.5 to 25 mg q6 hours prn-  for cyclic vomiting  2.  I wonder if it's an abdominal migraine- will try Imitrex  100 mg can take 2 pills max in a day- 2 hours apart at minimum- no signs of stroke on exam, so can try Triptan.   3. Will restart her Valium  2.5 mg as needed for muscle spasms as  well as nausea/vomiting   4.  Will change Norco to Oxy to get rid of tylenol  which can increase LFTs- - to see if can get back up on Arava  which also affects LFT levels- - which is hurting her hands a lot worse since decreased Arava  as a result- .   5. F/U in 2 months with Fidela and 4 months with me  6. Drug screen checked 09/2023- isn't due yet- per clinic policy.   7. Narcan  spray sent in because on Valium  and Oxycodone   8. Try not to use Valium  regularly, since on Oxycodone - there's a black box warning benzo's with Narcotics- can increase risk of overdose.   I spent a total of  41  minutes on total care today- >50% coordination of care- due to d/w pt about her cyclical vomiting, which could be abd migraine or spasms- and how to take Benzos/s opiates

## 2023-11-20 NOTE — Patient Instructions (Addendum)
 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  L hip bursitis and also has SI joint pain more on L- as well as mid thoracic acute on chronic pain.   Here for f/u on chronic pain from back and psoriatic arthritis.     Will change Phenergan  to 12.5 to 25 mg q6 hours prn-  for cyclic vomiting  2.  I wonder if it's an abdominal migraine- will try Imitrex  100 mg can take 2 pills max in a day- 2 hours apart at minimum- no signs of stroke on exam, so can try Triptan.   3. Will restart her Valium  2.5 mg as needed for muscle spasms as well as nausea/vomiting   4.  Will change Norco to Oxy to get rid of tylenol  which can increase LFTs- - to see if can get back up on Arava  which also affects LFT levels- - which is hurting her hands a lot worse since decreased Arava  as a result- .   5. F/U in 2 months with Fidela and 4 months with me  6. Drug screen checked 09/2023- isn't due yet- per clinic policy.   7. Narcan  spray sent in because on Valium  and Oxycodone    8. Try not to use Valium  regularly, since on Oxycodone - there's a black box warning benzo's with Narcotics- can increase risk of overdose.

## 2023-11-20 NOTE — Telephone Encounter (Signed)
 Hoy from [place] called stating that a prescription for Oxycodone  was sent in for the patient but patient just filled Norco on 11/15/23. Advise on how to fill Oxycodone .

## 2023-11-25 ENCOUNTER — Ambulatory Visit (HOSPITAL_COMMUNITY)

## 2023-11-26 ENCOUNTER — Ambulatory Visit (INDEPENDENT_AMBULATORY_CARE_PROVIDER_SITE_OTHER): Admitting: Podiatry

## 2023-11-26 DIAGNOSIS — Z91198 Patient's noncompliance with other medical treatment and regimen for other reason: Secondary | ICD-10-CM

## 2023-11-26 NOTE — Progress Notes (Signed)
 1. Failure to attend appointment with reason given    Appointment canceled by patient.

## 2023-12-12 ENCOUNTER — Encounter: Payer: Self-pay | Admitting: Gastroenterology

## 2023-12-20 DIAGNOSIS — M4804 Spinal stenosis, thoracic region: Secondary | ICD-10-CM | POA: Diagnosis not present

## 2023-12-20 DIAGNOSIS — M546 Pain in thoracic spine: Secondary | ICD-10-CM | POA: Diagnosis not present

## 2023-12-25 ENCOUNTER — Encounter: Payer: Self-pay | Admitting: Physical Medicine and Rehabilitation

## 2023-12-25 DIAGNOSIS — M810 Age-related osteoporosis without current pathological fracture: Secondary | ICD-10-CM | POA: Diagnosis not present

## 2023-12-25 DIAGNOSIS — E1129 Type 2 diabetes mellitus with other diabetic kidney complication: Secondary | ICD-10-CM | POA: Diagnosis not present

## 2023-12-27 ENCOUNTER — Other Ambulatory Visit: Payer: Self-pay | Admitting: Physician Assistant

## 2023-12-27 DIAGNOSIS — Z79899 Other long term (current) drug therapy: Secondary | ICD-10-CM

## 2023-12-27 DIAGNOSIS — L409 Psoriasis, unspecified: Secondary | ICD-10-CM

## 2023-12-27 DIAGNOSIS — L4050 Arthropathic psoriasis, unspecified: Secondary | ICD-10-CM

## 2023-12-27 NOTE — Telephone Encounter (Signed)
 Last Fill: 10/07/2023  Labs: 12/25/2023 CMP Glucose 101 BUN 34 Creatinine 1.2 eGFR 48 CO2 18  10/15/2023 CBC WBC 13 RBC 3.58 MCV 98 Neutrophils 9 Monocytes 1 Eos 0.6 Immature Grans 0.2  TB Gold: 09/20/2022 TB gold negative.   Next Visit: 01/29/2024  Last Visit: 10/30/2023  DX: Psoriatic arthritis (HCC)   Current Dose per office note 10/30/2023: Taltz  80 mg sq injections q 4 weeks   Contacted the patient and advised TB Gold is due. Patient states she will stop by the office Tuesday or Wednesday to get labs drawn. TB Gold is already in order review.   Okay to refill Taltz ?

## 2023-12-30 ENCOUNTER — Other Ambulatory Visit: Payer: Self-pay | Admitting: Gastroenterology

## 2023-12-30 DIAGNOSIS — R112 Nausea with vomiting, unspecified: Secondary | ICD-10-CM

## 2023-12-30 DIAGNOSIS — K219 Gastro-esophageal reflux disease without esophagitis: Secondary | ICD-10-CM

## 2023-12-30 MED ORDER — SUCRALFATE 1 G PO TABS: 1.0000 g | ORAL_TABLET | Freq: Four times a day (QID) | ORAL | 1 refills | Status: AC

## 2024-01-06 DIAGNOSIS — M5414 Radiculopathy, thoracic region: Secondary | ICD-10-CM | POA: Diagnosis not present

## 2024-01-08 ENCOUNTER — Encounter: Payer: Self-pay | Admitting: *Deleted

## 2024-01-08 NOTE — Progress Notes (Signed)
 Tonya Hall                                          MRN: [id number]   01/08/2024   The [facility] Quality Team Specialist reviewed this patient medical record for the purposes of chart review for care gap closure. The following were reviewed: chart review for care gap closure-kidney health evaluation for diabetes:eGFR  and uACR.    [facility] Quality Team

## 2024-01-11 ENCOUNTER — Other Ambulatory Visit: Payer: Self-pay | Admitting: Physician Assistant

## 2024-01-13 NOTE — Telephone Encounter (Signed)
 Last Fill: 10/11/2023  Next Visit: 01/29/2024  Last Visit: 10/30/2023  Dx: Psoriatic arthritis   Current Dose per office note on 10/30/2023: prednisone  5 mg 1 tablet daily   Okay to refill Prednisone ?

## 2024-01-14 NOTE — Progress Notes (Signed)
 Subjective:    Patient ID: Tonya Hall Blood, female    DOB: 01/09/1951, 73 y.o.   MRN: [id number]  HPI: Tonya Hall is a 73 y.o. female who returns for follow up appointment for chronic pain and medication refill. She states her pain is located in her neck, mid.back and lower back pain radiating into her left hip.  She reports she is receiving 4- 5 hours of relief. She  rates her  pain 6. Her current exercise regime is walking and performing stretching exercises.  Ms. Encalade Morphine equivalent is 15,00 MME. She  is also prescribed Diazepam   .We have discussed the black box warning of using opioids and benzodiazepines. I highlighted the dangers of using these drugs together and discussed the adverse events including respiratory suppression, overdose, cognitive impairment and importance of compliance with current regimen. We will continue to monitor and adjust as indicated.   Last Oral swab was Performed 09/23/2023,     Pain Inventory Average Pain 7 Pain Right Now 6 My pain is intermittent, sharp, dull, and aching  In the last 24 hours, has pain interfered with the following? General activity 8 Relation with others 5 Enjoyment of life 8 What TIME of day is your pain at its worst? daytime Sleep (in general) Good  Pain is worse with: bending, standing, and some activites Pain improves with: rest, heat/ice, and medication Relief from Meds: 5  Family History  Problem Relation Age of Onset   Diabetes Mother    Pancreatitis Mother    Arthritis Mother        psoriatic    Fibromyalgia Mother    Rheumatic fever Father    Diabetes Other        grandparents   Skin cancer Other        grandfather   Hypertension Other        grandparent   Congestive Heart Failure Other        grandfather   Parkinson's disease Other        grandmother   Colon cancer Maternal Uncle    Fibromyalgia Sister    Rheum arthritis Sister    Diabetes Sister    Fibromyalgia Sister    Diabetes Sister     Rheum arthritis Sister    Hypertension Son    Colon polyps Neg Hx    Social History   Socioeconomic History   Marital status: Married    Spouse name: Not on file   Number of children: 2   Years of education: Not on file   Highest education level: Not on file  Occupational History   Occupation: Case Midwife    Employer: [employer]    Comment: Estate manager/land agent  Tobacco Use   Smoking status: Never    Passive exposure: Never   Smokeless tobacco: Never  Vaping Use   Vaping status: Never Used  Substance and Sexual Activity   Alcohol  use: No   Drug use: No   Sexual activity: Not Currently    Partners: Male    Birth control/protection: Surgical    Comment: BTL  Other Topics Concern   Not on file  Social History Narrative   ATTENDS [employer]. 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   Social Drivers of Corporate investment banker Strain: Not on file  Food Insecurity: No Food Insecurity (07/18/2023)   Hunger Vital Sign    Worried About Running Out of Food in the Last Year: Never true    Ran Out of Food  in the Last Year: Never true  Transportation Needs: No Transportation Needs (07/18/2023)   PRAPARE - Administrator, Civil Service (Medical): No    Lack of Transportation (Non-Medical): No  Physical Activity: Not on file  Stress: Not on file  Social Connections: Socially Integrated (07/18/2023)   Social Connection and Isolation Panel    Frequency of Communication with Friends and Family: More than three times a week    Frequency of Social Gatherings with Friends and Family: Three times a week    Attends Religious Services: More than 4 times per year    Active Member of Clubs or Organizations: Yes    Attends Banker Meetings: More than 4 times per year    Marital Status: Married   Past Surgical History:  Procedure Laterality Date   ANTERIOR CERVICAL DECOMP/DISCECTOMY FUSION N/A 03/09/2020   Procedure: ANTERIOR CERVICAL DECOMPRESSION/DISCECTOMY FUSION,  INTERBODY PROSTHESIS, PLATE SCREWS CERVICAL FIVE-SIX, CERVICAL SIX-SEVEN;  Surgeon: Mavis Purchase, MD;  Location: [facility] OR;  Service: Neurosurgery;  Laterality: N/A;   BACK SURGERY  2003   BALLOON DILATION N/A 11/12/2022   Procedure: BALLOON DILATION;  Surgeon: Cindie Carlin POUR, DO;  [facility];  Service: Endoscopy;  Laterality: N/A;   BIOPSY  04/17/2016   Procedure: BIOPSY;  Surgeon: Margo LITTIE Haddock, MD;  [facility]: [facility];  Service: Endoscopy;;  random colon bx's   BIOPSY  11/12/2022   Procedure: BIOPSY;  Surgeon: Cindie Carlin POUR, DO;  [facility];  Service: Endoscopy;;   CERVICAL SPINE SURGERY  09/09/2017   CESAREAN SECTION  1974, 1978       CHOLECYSTECTOMY  04/2023   COLONOSCOPY  06/19/2008   DOQ:dfjoo internal hemorrhoids/mild sigmoin colon diverticulosis   COLONOSCOPY  2012   Dr. Shaaron: normal rectum, diverticula, lymphocytic colitis    COLONOSCOPY WITH PROPOFOL  N/A 04/17/2016   Dr. Haddock: Normal terminal ileum, internal/external hemorrhoids, random colon biopsies consistent with collagenous colitis   DILATION AND CURETTAGE OF UTERUS  1977   spontaneous abortion   ESOPHAGOGASTRODUODENOSCOPY  06/19/2008   DOQ:fnizmjuz gastritis   ESOPHAGOGASTRODUODENOSCOPY (EGD) WITH PROPOFOL  N/A 11/12/2022   Procedure: ESOPHAGOGASTRODUODENOSCOPY (EGD) WITH PROPOFOL ;  Surgeon: Cindie Carlin POUR, DO;  [facility];  Service: Endoscopy;  Laterality: N/A;  8:30 am, asa 3   LUMBAR DISC ARTHROPLASTY  09/1996   LUMBAR FUSION  6/03, 11/08, 11/14   L4-5 fusion, L2-3 fusion, L1-2   LUMBAR FUSION  08/03/2020   L4-L5   LUMBAR LAMINECTOMY  03/2020   MOUTH SURGERY  12/07/2021   oral surgery   ODONTOID SCREW INSERTION N/A 01/27/2018   Procedure: ODONTOID SCREW INSERTION;  Surgeon: Mavis Purchase, MD;  Location: [facility] OR;  Service: Neurosurgery;  Laterality: N/A;  ODONTOID SCREW INSERTION   THORACIC DISCECTOMY N/A 07/18/2023   Procedure: THORACIC DISCECTOMY THORACIC  ELEVEN-TWELVE DISCECTOMY;  Surgeon: Mavis Purchase, MD;  Location: [facility] OR;  Service: Neurosurgery;  Laterality: N/A;  T11-T   TONSILLECTOMY     TONSILLECTOMY AND ADENOIDECTOMY     TUBAL LIGATION     Past Surgical History:  Procedure Laterality Date   ANTERIOR CERVICAL DECOMP/DISCECTOMY FUSION N/A 03/09/2020   Procedure: ANTERIOR CERVICAL DECOMPRESSION/DISCECTOMY FUSION, INTERBODY PROSTHESIS, PLATE SCREWS CERVICAL FIVE-SIX, CERVICAL SIX-SEVEN;  Surgeon: Mavis Purchase, MD;  Location: [facility] OR;  Service: Neurosurgery;  Laterality: N/A;   BACK SURGERY  2003   BALLOON DILATION N/A 11/12/2022   Procedure: BALLOON DILATION;  Surgeon: Cindie Carlin POUR, DO;  [facility];  Service: Endoscopy;  Laterality: N/A;   BIOPSY  04/17/2016   Procedure: BIOPSY;  Surgeon: Margo LITTIE Haddock, MD;  [facility];  Service: Endoscopy;;  random colon bx's   BIOPSY  11/12/2022   Procedure: BIOPSY;  Surgeon: Cindie Carlin POUR, DO;  [facility];  Service: Endoscopy;;   CERVICAL SPINE SURGERY  09/09/2017   CESAREAN SECTION  1974, 1978       CHOLECYSTECTOMY  04/2023   COLONOSCOPY  06/19/2008   DOQ:dfjoo internal hemorrhoids/mild sigmoin colon diverticulosis   COLONOSCOPY  2012   Dr. Shaaron: normal rectum, diverticula, lymphocytic colitis    COLONOSCOPY WITH PROPOFOL  N/A 04/17/2016   Dr. Haddock: Normal terminal ileum, internal/external hemorrhoids, random colon biopsies consistent with collagenous colitis   DILATION AND CURETTAGE OF UTERUS  1977   spontaneous abortion   ESOPHAGOGASTRODUODENOSCOPY  06/19/2008   DOQ:fnizmjuz gastritis   ESOPHAGOGASTRODUODENOSCOPY (EGD) WITH PROPOFOL  N/A 11/12/2022   Procedure: ESOPHAGOGASTRODUODENOSCOPY (EGD) WITH PROPOFOL ;  Surgeon: Cindie Carlin POUR, DO;  [facility];  Service: Endoscopy;  Laterality: N/A;  8:30 am, asa 3   LUMBAR DISC ARTHROPLASTY  09/1996   LUMBAR FUSION  6/03, 11/08, 11/14   L4-5 fusion, L2-3 fusion, L1-2   LUMBAR  FUSION  08/03/2020   L4-L5   LUMBAR LAMINECTOMY  03/2020   MOUTH SURGERY  12/07/2021   oral surgery   ODONTOID SCREW INSERTION N/A 01/27/2018   Procedure: ODONTOID SCREW INSERTION;  Surgeon: Mavis Purchase, MD;  Location: [facility] OR;  Service: Neurosurgery;  Laterality: N/A;  ODONTOID SCREW INSERTION   THORACIC DISCECTOMY N/A 07/18/2023   Procedure: THORACIC DISCECTOMY THORACIC ELEVEN-TWELVE DISCECTOMY;  Surgeon: Mavis Purchase, MD;  Location: [facility] OR;  Service: Neurosurgery;  Laterality: N/A;  T11-T   TONSILLECTOMY     TONSILLECTOMY AND ADENOIDECTOMY     TUBAL LIGATION     Past Medical History:  Diagnosis Date   Asthma    Albuterol  in haler prn   Cataract    immature unsure which eye   Chronic back pain    COVID-19 October/November 2020   Degenerative disk disease    psoriatic   Diabetes mellitus without complication (HCC)    diet controlled   Elevated liver enzymes    r/t leflunamide (this medication is put on hold intermittently due to this)   Esophageal motility disorder    Non-specific, see modified barium study/speech path, BP   GERD (gastroesophageal reflux disease)    takes Protonix  bid   History of blood transfusion    post c-section   History of bronchitis    History of hiatal hernia    HTN (hypertension)    Hyperlipidemia    Lymphocytic colitis 05/26/2010   Responded to Entocort x 3 MOS   Neuropathy    Nonallergic rhinitis    Osteoporosis    gets Prolia  every 6 months   Peripheral edema    Pneumonia 02/2019   Psoriatic arthritis (HCC)    Dr. Elin   Psoriatic arthritis [facility])    bilateral hands   Raynaud's disease    Seasonal allergies    Shingles 2020   SUI (stress urinary incontinence, female)    Urinary urgency    LMP 02/01/1999   Opioid Risk Score:   Fall Risk Score:  `1  Depression screen [facility] 2/9     09/23/2023   11:09 AM 07/24/2023    9:30 AM 05/21/2023    9:11 AM 03/20/2023    9:50 AM 02/20/2023    1:05 PM  12/13/2022   10:04 AM  09/19/2022   10:21 AM  Depression screen PHQ 2/9  Decreased Interest 0 0 0 0 0 0 0  Down, Depressed, Hopeless 0 0 0 0 0 0 0  PHQ - 2 Score 0 0 0 0 0 0 0    Review of Systems     Objective:   Physical Exam Vitals and nursing note reviewed.  Constitutional:      Appearance: Normal appearance.  Cardiovascular:     Rate and Rhythm: Normal rate and regular rhythm.     Pulses: Normal pulses.     Heart sounds: Normal heart sounds.  Pulmonary:     Effort: Pulmonary effort is normal.     Breath sounds: Normal breath sounds.  Musculoskeletal:     Comments: Normal Muscle Bulk and Muscle Testing Reveals:  Upper Extremities: Full ROM and Muscle Strength 5/5  Thoracic Paraspinal Tenderness: T-10-T-12 Lumbar Paraspinal Tenderness: L-4- L-5 Mainly Left Side  Left Greater Trochanter Tenderness Lower Extremities: Full ROM and Muscle Strength 5/5 Bilateral Lower Extremities Flexion Produces Pain into her Bilateral Patella's Arises from Table slowly using cane for support Antalgic  Gait     Skin:    General: Skin is warm and dry.  Neurological:     Mental Status: She is alert and oriented to person, place, and time.  Psychiatric:        Mood and Affect: Mood normal.        Behavior: Behavior normal.          Assessment & Plan:  Cervicalgia: No complaints today.  Continue HEP as Tolerated. Continue to Monitor. 01/15/2024 Discogenic Thoracic Pain: Neurosurgery Following. Continue HEP as Tolerated. Continue to Monitor. 01/15/2024 Greater Trochanteric Bursitis: Continue HEP as Tolerated. Continue to Monitor. 01/15/2024 Primary Osteoarthritis in multiple Joints. Continue current medication regimen. Continue to monitor. 01/15/2024 Psoriatic Arthritis: Continue current medication regimen. Continue to monitor. 01/15/2024 Chronic Pain Syndrome: Refilled: Oxycodone  5 mg  one tablet three times a day as needed for pain #90. We will continue the opioid monitoring program, this consists of regular  clinic visits, examinations, urine drug screen, pill counts as well as use of [place]  Controlled Substance Reporting system. A 12 month History has been reviewed on the [place]  Controlled Substance Reporting System on 01/15/2024.   Left Lumbar Radiculitis: Continue current medication regimen. Continue to Monitor.    F/U with Dr Lovorn in 2 months

## 2024-01-15 ENCOUNTER — Encounter: Payer: Self-pay | Admitting: Registered Nurse

## 2024-01-15 ENCOUNTER — Other Ambulatory Visit: Payer: Self-pay | Admitting: *Deleted

## 2024-01-15 ENCOUNTER — Encounter: Attending: Physical Medicine and Rehabilitation | Admitting: Registered Nurse

## 2024-01-15 VITALS — BP 123/68 | HR 83 | Ht <= 58 in | Wt 122.6 lb

## 2024-01-15 DIAGNOSIS — M5416 Radiculopathy, lumbar region: Secondary | ICD-10-CM | POA: Diagnosis not present

## 2024-01-15 DIAGNOSIS — Z5181 Encounter for therapeutic drug level monitoring: Secondary | ICD-10-CM | POA: Insufficient documentation

## 2024-01-15 DIAGNOSIS — M5134 Other intervertebral disc degeneration, thoracic region: Secondary | ICD-10-CM | POA: Insufficient documentation

## 2024-01-15 DIAGNOSIS — M542 Cervicalgia: Secondary | ICD-10-CM | POA: Diagnosis not present

## 2024-01-15 DIAGNOSIS — Z79891 Long term (current) use of opiate analgesic: Secondary | ICD-10-CM | POA: Diagnosis not present

## 2024-01-15 DIAGNOSIS — Z79899 Other long term (current) drug therapy: Secondary | ICD-10-CM

## 2024-01-15 DIAGNOSIS — G894 Chronic pain syndrome: Secondary | ICD-10-CM | POA: Diagnosis not present

## 2024-01-15 MED ORDER — OXYCODONE HCL 5 MG PO TABS
5.0000 mg | ORAL_TABLET | Freq: Three times a day (TID) | ORAL | 0 refills | Status: AC | PRN
Start: 2024-01-15 — End: ?

## 2024-01-15 MED ORDER — OXYCODONE HCL 5 MG PO TABS: 5.0000 mg | ORAL_TABLET | Freq: Three times a day (TID) | ORAL | 0 refills | Status: DC | PRN

## 2024-01-15 NOTE — Progress Notes (Unsigned)
 Office Visit Note  Patient: Tonya Hall             Date of Birth: 1951-02-20           MRN: [id number]             PCP: Sheryle Carwin, MD Referring: Sheryle Carwin, MD Visit Date: 01/29/2024 Occupation: Data Unavailable  Subjective:  Chronic pain and fatigue   History of Present Illness: Tonya Hall is a 73 y.o. female with history of psoriatic arthritis.  Patient remains on Taltz  80 mg sq injections q 4 weeks, Arava  20 mg 1 tablet daily, Otezla  30 mg 1 tablet by mouth twice daily, and prednisone  5 mg 1 tablet daily.  She is tolerating combination therapy without any side effects.  Patient continues to have chronic pain involving multiple joints.  She has persistent pain affecting the thoracic spine.  She had an adequate response with a cortisone injection in the past.  She does not plan to proceed with any future surgical interventions since the previous surgery did not provide benefit.  She continues to have chronic pain in both knee joints.  She would like to reapply for viscosupplementation for both knees which has been helpful in the past.  She continues to have chronic pain in both hands but denies any joint swelling.  She denies any active psoriasis at this time. She takes oxycodone  5 mg TID and remains on gabapentin  for pain management.  She uses a cane to assist with ambulation. The patient in her back limits her activity level and ability to stand longer than 15 minutes. She denies any recurrent infections.    Activities of Daily Living:  Patient reports morning stiffness for 24 hours.   Patient Denies nocturnal pain.  Difficulty dressing/grooming: Reports Difficulty climbing stairs: Reports Difficulty getting out of chair: Reports Difficulty using hands for taps, buttons, cutlery, and/or writing: Reports  Review of Systems  Constitutional:  Positive for fatigue.  HENT:  Positive for mouth dryness. Negative for mouth sores.   Eyes:  Positive for dryness.  Respiratory:   Positive for shortness of breath.   Cardiovascular:  Negative for chest pain and palpitations.  Gastrointestinal:  Positive for diarrhea. Negative for blood in stool and constipation.  Endocrine: Negative for increased urination.  Genitourinary:  Positive for involuntary urination.  Musculoskeletal:  Positive for joint pain, gait problem, joint pain, joint swelling, myalgias, muscle weakness, morning stiffness, muscle tenderness and myalgias.  Skin:  Positive for color change. Negative for rash, hair loss and sensitivity to sunlight.  Allergic/Immunologic: Negative for susceptible to infections.  Neurological:  Negative for dizziness and headaches.  Hematological:  Positive for swollen glands.  Psychiatric/Behavioral:  Negative for depressed mood and sleep disturbance. The patient is not nervous/anxious.     PMFS History:  Patient Active Problem List   Diagnosis Date Noted   Cyclical vomiting 11/20/2023   Degenerative thoracic spinal stenosis 07/18/2023   Myelopathy concurrent with and due to spinal stenosis of thoracic region [facility]) 07/18/2023   Upper abdominal pain 06/10/2023   Biliary dyskinesia 04/15/2023   Contracture of joint of hand 03/20/2023   Discogenic thoracic pain 03/20/2023   Encounter for diabetic foot exam (HCC) 01/28/2023   Diabetic peripheral neuropathy associated with type 2 diabetes mellitus (HCC) 01/28/2023   Acquired hammertoes of both feet 01/28/2023   Hallux valgus, acquired, bilateral 01/28/2023   Corns and callosities 01/28/2023   Onychomycosis 01/28/2023   N&V (nausea and vomiting) 01/28/2023   History  of lumbar fusion 01/23/2023   Sciatica 01/23/2023   Statin not tolerated 10/23/2021   Chronic allergic conjunctivitis 07/29/2021   Vasomotor rhinitis 07/29/2021   Benign neoplasm of skin of lower limb 05/17/2021   History of neoplasm 05/17/2021   Melanocytic nevi of left upper limb, including shoulder 05/17/2021   Melanocytic nevi of trunk 05/17/2021    Other seborrheic keratosis 05/17/2021   Diarrhea 03/10/2021   Fracture of pelvis (HCC) 01/27/2021   Pain of left hip joint 01/06/2021   Foraminal stenosis of lumbar region 08/03/2020   Cervical spondylosis with myelopathy and radiculopathy 03/09/2020   AKI (acute kidney injury) 02/07/2019   Acute respiratory failure with hypoxia (HCC) 02/06/2019   Acute respiratory disease due to COVID-19 virus 02/06/2019   Community acquired pneumonia 02/06/2019   Hyponatremia 02/06/2019   Asthma    Diabetes mellitus without complication (HCC)    Closed nondisplaced odontoid fracture with type II morphology (HCC) 01/27/2018   Odontoid fracture (HCC) 01/24/2018   Chest pain 11/14/2017   Transaminitis 06/22/2016   Collagenous colitis 05/23/2016   Psoriasis 05/21/2016   High risk medication use 02/15/2016   Age-related osteoporosis without current pathological fracture 02/15/2016   Trochanteric bursitis, left hip 02/15/2016   Sacroiliitis, not elsewhere classified 02/15/2016   Chronic pain syndrome 02/15/2016   Abnormal weight gain 06/25/2012   Hypertension 07/03/2011   Hypercholesteremia 07/03/2011   Psoriatic arthritis (HCC) 07/03/2011   Osteoarthritis of multiple joints 07/03/2011   Esophageal motility disorder 02/14/2011   Cough 02/14/2011   GERD 05/05/2010    Past Medical History:  Diagnosis Date   Asthma    Albuterol  in haler prn   Cataract    immature unsure which eye   Chronic back pain    COVID-19 October/November 2020   Degenerative disk disease    psoriatic   Diabetes mellitus without complication (HCC)    diet controlled   Elevated liver enzymes    r/t leflunamide (this medication is put on hold intermittently due to this)   Esophageal motility disorder    Non-specific, see modified barium study/speech path, BP   GERD (gastroesophageal reflux disease)    takes Protonix  bid   History of blood transfusion    post c-section   History of bronchitis    History of hiatal  hernia    HTN (hypertension)    Hyperlipidemia    Lymphocytic colitis 05/26/2010   Responded to Entocort x 3 MOS   Neuropathy    Nonallergic rhinitis    Osteoporosis    gets Prolia  every 6 months   Peripheral edema    Pneumonia 02/2019   Psoriatic arthritis (HCC)    Dr. Elin   Psoriatic arthritis [facility])    bilateral hands   Raynaud's disease    Seasonal allergies    Shingles 2020   SUI (stress urinary incontinence, female)    Urinary urgency     Family History  Problem Relation Age of Onset   Diabetes Mother    Pancreatitis Mother    Arthritis Mother        psoriatic    Fibromyalgia Mother    Rheumatic fever Father    Diabetes Other        grandparents   Skin cancer Other        grandfather   Hypertension Other        grandparent   Congestive Heart Failure Other        grandfather   Parkinson's disease Other  grandmother   Colon cancer Maternal Uncle    Fibromyalgia Sister    Rheum arthritis Sister    Diabetes Sister    Fibromyalgia Sister    Diabetes Sister    Rheum arthritis Sister    Hypertension Son    Colon polyps Neg Hx    Past Surgical History:  Procedure Laterality Date   ANTERIOR CERVICAL DECOMP/DISCECTOMY FUSION N/A 03/09/2020   Procedure: ANTERIOR CERVICAL DECOMPRESSION/DISCECTOMY FUSION, INTERBODY PROSTHESIS, PLATE SCREWS CERVICAL FIVE-SIX, CERVICAL SIX-SEVEN;  Surgeon: Mavis Purchase, MD;  Location: [facility] OR;  Service: Neurosurgery;  Laterality: N/A;   BACK SURGERY  2003   BALLOON DILATION N/A 11/12/2022   Procedure: BALLOON DILATION;  Surgeon: Cindie Carlin POUR, DO;  Location: [facility];  Service: Endoscopy;  Laterality: N/A;   BIOPSY  04/17/2016   Procedure: BIOPSY;  Surgeon: Margo LITTIE Haddock, MD;  Location: [facility];  Service: Endoscopy;;  random colon bx's   BIOPSY  11/12/2022   Procedure: BIOPSY;  Surgeon: Cindie Carlin POUR, DO;  Location: [facility];  Service: Endoscopy;;   CERVICAL SPINE SURGERY  09/09/2017   CESAREAN  SECTION  1974, 1978       CHOLECYSTECTOMY  04/2023   COLONOSCOPY  06/19/2008   DOQ:dfjoo internal hemorrhoids/mild sigmoin colon diverticulosis   COLONOSCOPY  2012   Dr. Shaaron: normal rectum, diverticula, lymphocytic colitis    COLONOSCOPY WITH PROPOFOL  N/A 04/17/2016   Dr. Haddock: Normal terminal ileum, internal/external hemorrhoids, random colon biopsies consistent with collagenous colitis   DILATION AND CURETTAGE OF UTERUS  1977   spontaneous abortion   ESOPHAGOGASTRODUODENOSCOPY  06/19/2008   DOQ:fnizmjuz gastritis   ESOPHAGOGASTRODUODENOSCOPY (EGD) WITH PROPOFOL  N/A 11/12/2022   Procedure: ESOPHAGOGASTRODUODENOSCOPY (EGD) WITH PROPOFOL ;  Surgeon: Cindie Carlin POUR, DO;  Location: [facility];  Service: Endoscopy;  Laterality: N/A;  8:30 am, asa 3   LUMBAR DISC ARTHROPLASTY  09/1996   LUMBAR FUSION  6/03, 11/08, 11/14   L4-5 fusion, L2-3 fusion, L1-2   LUMBAR FUSION  08/03/2020   L4-L5   LUMBAR LAMINECTOMY  03/2020   MOUTH SURGERY  12/07/2021   oral surgery   ODONTOID SCREW INSERTION N/A 01/27/2018   Procedure: ODONTOID SCREW INSERTION;  Surgeon: Mavis Purchase, MD;  Location: [facility] OR;  Service: Neurosurgery;  Laterality: N/A;  ODONTOID SCREW INSERTION   THORACIC DISCECTOMY N/A 07/18/2023   Procedure: THORACIC DISCECTOMY THORACIC ELEVEN-TWELVE DISCECTOMY;  Surgeon: Mavis Purchase, MD;  Location: [facility] OR;  Service: Neurosurgery;  Laterality: N/A;  T11-T   TONSILLECTOMY     TONSILLECTOMY AND ADENOIDECTOMY     TUBAL LIGATION     Social History   Tobacco Use   Smoking status: Never    Passive exposure: Never   Smokeless tobacco: Never  Vaping Use   Vaping status: Never Used  Substance Use Topics   Alcohol  use: No   Drug use: No   Social History   Social History Narrative   ATTENDS [employer]. 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     Immunization History  Administered Date(s) Administered   Influenza,inj,Quad PF,6+ Mos 01/21/2017   Influenza-Unspecified  01/30/2017   Moderna Sars-Covid-2 Vaccination 06/01/2019, 07/02/2019   Pneumococcal Polysaccharide-23 02/19/2013   Tdap 09/10/2016     Objective: Vital Signs: BP 118/70   Pulse 89   Temp 97.7 F (36.5 C)   Resp 16   Ht 4' 10 (1.473 m)   Wt 120 lb 9.6 oz (54.7 kg)   LMP 02/01/1999   BMI 25.21 kg/m    Physical  Exam Vitals and nursing note reviewed.  Constitutional:      Appearance: She is well-developed.  HENT:     Head: Normocephalic and atraumatic.  Eyes:     Conjunctiva/sclera: Conjunctivae normal.  Cardiovascular:     Rate and Rhythm: Normal rate and regular rhythm.     Heart sounds: Normal heart sounds.  Pulmonary:     Effort: Pulmonary effort is normal.     Breath sounds: Normal breath sounds.  Abdominal:     General: Bowel sounds are normal.     Palpations: Abdomen is soft.  Musculoskeletal:     Cervical back: Normal range of motion.  Lymphadenopathy:     Cervical: No cervical adenopathy.  Skin:    General: Skin is warm and dry.     Capillary Refill: Capillary refill takes less than 2 seconds.  Neurological:     Mental Status: She is alert and oriented to person, place, and time.  Psychiatric:        Behavior: Behavior normal.      Musculoskeletal Exam: C-spine has limited range of motion with lateral rotation.  Thoracic kyphosis noted.  Limited mobility of the thoracic and lumbar spine.  Shoulder joints have good range of motion.  Elbow joints have good range of motion with no inflammation.  No tenderness or synovitis of wrist joints noted.  Severe PIP and DIP thickening with subluxation of several PIP and DIP joints.  Incomplete fist formation.  Limited extension of PIP and DIP joints.  Subluxation of both CMC joints.  Hip joints difficult to assess in seated position.  Discomfort range of motion of both knees but no warmth or effusion noted.  No tenderness or swelling of ankle joints.  No evidence of Achilles tendinitis or plantar fasciitis.  CDAI  Exam: CDAI Score: -- Patient Global: --; Provider Global: -- Swollen: --; Tender: -- Joint Exam 01/29/2024   No joint exam has been documented for this visit   There is currently no information documented on the homunculus. Go to the Rheumatology activity and complete the homunculus joint exam.  Investigation: No additional findings.  Imaging: No results found.  Recent Labs: Lab Results  Component Value Date   WBC 11.1 (H) 01/15/2024   HGB 11.9 01/15/2024   PLT 256 01/15/2024   NA 135 01/15/2024   K 4.8 01/15/2024   CL 105 01/15/2024   CO2 21 01/15/2024   GLUCOSE 140 (H) 01/15/2024   BUN 43 (H) 01/15/2024   CREATININE 1.20 (H) 01/15/2024   BILITOT 0.3 01/15/2024   ALKPHOS 75 08/08/2023   AST 22 01/15/2024   ALT 30 (H) 01/15/2024   PROT 6.4 01/15/2024   ALBUMIN  4.0 08/08/2023   CALCIUM  9.8 01/15/2024   GFRAA 65 06/08/2020   QFTBGOLD Negative 05/05/2015   QFTBGOLDPLUS NEGATIVE 09/20/2022    Speciality Comments: MTX- high LFTs, PLQ,SSZ,Imuran, Humira- allergy, Enbrel , Remicade  - inadequate response  Procedures:  No procedures performed Allergies: Adalimumab, Imuran [azathioprine sodium], Methotrexate, Plaquenil [hydroxychloroquine sulfate], Sulfonamide derivatives, Ceftin [cefuroxime axetil], Lisinopril, Sulfamethoxazole-trimethoprim, and Morphine    Assessment / Plan:     Visit Diagnoses: Psoriatic arthritis (HCC) - Inflammatory arthritis, sacroiliitis and psoriasis: She has no synovitis or dactylitis on examination today.  No evidence of Achilles tendinitis or plantar fasciitis.  Overall she has clinically been doing well on Taltz  80 mg subcutaneous injections once every 4 weeks, Otezla  30 mg 1 tablet twice daily, prednisone  5 mg 1 tablet daily, and Arava  20 mg 1 tablet daily.  She  is tolerating combination therapy without any side effects.  She continues to have chronic pain involving multiple joints but has no active inflammation on exam.  Most of her discomfort is  due to underlying osteoarthritis and previous damage.  She has noticed increased discomfort in both knee joints but has no warmth or effusion on exam.  Plan to reapply for viscosupplementation for both knees as requested.  She remains under the care of pain management and is taking oxycodone  5 mg 3 times daily and gabapentin  as prescribed. She will remain on Taltz , Arava , Otezla , prednisone  as prescribed.  She will continue to require close of monitoring.  She was advised to notify us  if she develops any signs or symptoms of a flare.  She will follow-up in the office in 5 months or sooner if needed.  Psoriasis: No active psoriasis.   High risk medication use - Taltz  80 mg sq injections q 4 weeks, Arava  20 mg 1 tablet daily, Otezla  30 mg 1 tablet by mouth twice daily, and prednisone  5 mg 1 tablet daily.  CBC and CMP updated on 01/15/24.  Hgb A1c updated today.  TB gold negative on 09/20/22.  Order for TB gold released today.  No recurrent infections. Discussed the importance of holding taltz  and arava  if she develops signs or symptoms of an infection and to resume once the infection has completely cleared.  - Plan: QuantiFERON-TB Gold Plus  Screening for tuberculosis - Order for TB gold released today. Plan: QuantiFERON-TB Gold Plus  Long term systemic steroid user - Plan to check Hgb A1c.  She remains on prednisone  5 mg daily.  Plan: HgB A1c  Trochanteric bursitis of both hips - Cortisone injection November 22, 2022.  Intermittent discomfort.   Chronic left SI joint pain - Injected May 29, 2022.  Chronic discomfort.    Primary osteoarthritis of both knees - Viscosupplement injections October 2023.  She presents today with increased discomfort and stiffness involving both knees.  No warmth or effusion noted on exam.  She has noticed recurrence of discomfort in both knees over the past 4 to 5 months.  Plan to reapply for viscosupplementation for both knees as requested.  She had a good response  to viscosupplementation in the past.  This patient is diagnosed with osteoarthritis of the knee(s).    Radiographs show evidence of joint space narrowing, osteophytes, subchondral sclerosis and/or subchondral cysts.  This patient has knee pain which interferes with functional and activities of daily living.    This patient has experienced inadequate response, adverse effects and/or intolerance with conservative treatments such as acetaminophen , NSAIDS, topical creams, physical therapy or regular exercise, knee bracing and/or weight loss.   This patient has experienced inadequate response or has a contraindication to intra articular steroid injections for at least 3 months.   This patient is not scheduled to have a total knee replacement within 6 months of starting treatment with viscosupplementation.  Primary osteoarthritis of both feet - Followed by Dr. Magdalen.  Ankle joints have good range of motion with no tenderness or joint swelling. No evidence of achilles tendonitis or plantar fasciitis.   DDD (degenerative disc disease), cervical: C-spine has limited range of motion with lateral rotation.   DDD (degenerative disc disease), thoracic - 07/2023 Laminectomy thoracic spine T11-T12  by Dr. Mavis.  Patient continues to have significant pain in the thoracic spine.  She has difficulty standing or walking for long periods of time.  She is using a cane to assist with ambulation.  Discussed that she may benefit from switching to a rollator walker.  Degeneration of intervertebral disc of lumbar region without discogenic back pain or lower extremity pain - Followed by Dr. Mavis.  She continues to have lower back pain.  She sees Dr. Lovorn for pain management. She is taking oxycodone  5 mg 2-3 times daily and gabapentin  as prescribed.  Raynaud's syndrome without gangrene: Intermittent symptoms of Raynaud's phenomenon.  No digital ulcerations noted.  No signs of sclerodactyly noted.  Age-related  osteoporosis without current pathological fracture - Followed by Dr. Tommas.  She is on Prolia  60 mg sq every 6 months.  Chronic pain syndrome: She is taking oxycodone  5 mg 1 tablet 2-3 times daily and remains on gabapentin  as prescribed.  She continues to have chronic pain involving multiple joints.  Her symptoms are most severe affecting the thoracic spine.  She is using a cane to assist with ambulation.  Discussed that she may benefit from using a rollator walker especially since she has difficulty standing or walking for any length of time.  Other medical conditions are listed as follows:  Esophageal motility disorder  Collagenous colitis  Essential hypertension: Blood pressure was 118/70 today in the office.  Adrenal insufficiency  Diabetic peripheral neuropathy associated with type 2 diabetes mellitus (HCC)  Elevated LFTs: AST 22, ALT 30 on 01/15/2024.  We will continue to monitor closely.   Senile purpura  History of cholecystectomy  History of type 2 diabetes mellitus  History of gastroesophageal reflux (GERD)   Orders: Orders Placed This Encounter  Procedures   QuantiFERON-TB Gold Plus   HgB A1c   No orders of the defined types were placed in this encounter.    Follow-Up Instructions: Return in about 5 months (around 06/28/2024) for Psoriatic arthritis.   Waddell CHRISTELLA Craze, PA-C  Note - This record has been created using Dragon software.  Chart creation errors have been sought, but may not always  have been located. Such creation errors do not reflect on  the standard of medical care.

## 2024-01-16 ENCOUNTER — Ambulatory Visit: Payer: Self-pay | Admitting: Rheumatology

## 2024-01-16 LAB — COMPREHENSIVE METABOLIC PANEL WITH GFR
AG Ratio: 1.8 (calc) (ref 1.0–2.5)
ALT: 30 U/L — ABNORMAL HIGH (ref 6–29)
AST: 22 U/L (ref 10–35)
Albumin: 4.1 g/dL (ref 3.6–5.1)
Alkaline phosphatase (APISO): 61 U/L (ref 37–153)
BUN/Creatinine Ratio: 36 (calc) — ABNORMAL HIGH (ref 6–22)
BUN: 43 mg/dL — ABNORMAL HIGH (ref 7–25)
CO2: 21 mmol/L (ref 20–32)
Calcium: 9.8 mg/dL (ref 8.6–10.4)
Chloride: 105 mmol/L (ref 98–110)
Creat: 1.2 mg/dL — ABNORMAL HIGH (ref 0.60–1.00)
Globulin: 2.3 g/dL (ref 1.9–3.7)
Glucose, Bld: 140 mg/dL — ABNORMAL HIGH (ref 65–99)
Potassium: 4.8 mmol/L (ref 3.5–5.3)
Sodium: 135 mmol/L (ref 135–146)
Total Bilirubin: 0.3 mg/dL (ref 0.2–1.2)
Total Protein: 6.4 g/dL (ref 6.1–8.1)
eGFR: 48 mL/min/1.73m2 — ABNORMAL LOW (ref >=60)

## 2024-01-16 LAB — CBC WITH DIFFERENTIAL/PLATELET
Absolute Lymphocytes: 1721 {cells}/uL (ref 850–3900)
Absolute Monocytes: 611 {cells}/uL (ref 200–950)
Basophils Absolute: 111 {cells}/uL (ref 0–200)
Basophils Relative: 1 %
Eosinophils Absolute: 477 {cells}/uL (ref 15–500)
Eosinophils Relative: 4.3 %
HCT: 35.5 % (ref 35.0–45.0)
Hemoglobin: 11.9 g/dL (ref 11.7–15.5)
MCH: 31.9 pg (ref 27.0–33.0)
MCHC: 33.5 g/dL (ref 32.0–36.0)
MCV: 95.2 fL (ref 80.0–100.0)
MPV: 10.9 fL (ref 7.5–12.5)
Monocytes Relative: 5.5 %
Neutro Abs: 8181 {cells}/uL — ABNORMAL HIGH (ref 1500–7800)
Neutrophils Relative %: 73.7 %
Platelets: 256 Thousand/uL (ref 140–400)
RBC: 3.73 Million/uL — ABNORMAL LOW (ref 3.80–5.10)
RDW: 12.9 % (ref 11.0–15.0)
Total Lymphocyte: 15.5 %
WBC: 11.1 Thousand/uL — ABNORMAL HIGH (ref 3.8–10.8)

## 2024-01-16 NOTE — Progress Notes (Signed)
 Glucose is elevated.  Creatinine is elevated most likely due to diuretic use and liver function is mildly elevated.  White cell count is elevated probably due to prednisone  use.

## 2024-01-17 ENCOUNTER — Telehealth: Payer: Self-pay | Admitting: Pharmacist

## 2024-01-17 DIAGNOSIS — M810 Age-related osteoporosis without current pathological fracture: Secondary | ICD-10-CM | POA: Diagnosis not present

## 2024-01-17 DIAGNOSIS — E1129 Type 2 diabetes mellitus with other diabetic kidney complication: Secondary | ICD-10-CM | POA: Diagnosis not present

## 2024-01-17 DIAGNOSIS — N1831 Chronic kidney disease, stage 3a: Secondary | ICD-10-CM | POA: Diagnosis not present

## 2024-01-17 DIAGNOSIS — I10 Essential (primary) hypertension: Secondary | ICD-10-CM | POA: Diagnosis not present

## 2024-01-17 NOTE — Telephone Encounter (Signed)
 Taltz  PAP renewal application printed to be completed at upcoming OV on 01/29/24

## 2024-01-22 ENCOUNTER — Ambulatory Visit: Admitting: Podiatry

## 2024-01-22 ENCOUNTER — Encounter: Payer: Self-pay | Admitting: Podiatry

## 2024-01-22 DIAGNOSIS — L84 Corns and callosities: Secondary | ICD-10-CM | POA: Diagnosis not present

## 2024-01-22 DIAGNOSIS — E1142 Type 2 diabetes mellitus with diabetic polyneuropathy: Secondary | ICD-10-CM | POA: Diagnosis not present

## 2024-01-28 ENCOUNTER — Ambulatory Visit: Admitting: Gastroenterology

## 2024-01-28 ENCOUNTER — Encounter: Payer: Self-pay | Admitting: Gastroenterology

## 2024-01-28 VITALS — BP 139/68 | HR 87 | Temp 97.8°F | Ht <= 58 in | Wt 119.0 lb

## 2024-01-28 DIAGNOSIS — R112 Nausea with vomiting, unspecified: Secondary | ICD-10-CM | POA: Diagnosis not present

## 2024-01-28 DIAGNOSIS — R197 Diarrhea, unspecified: Secondary | ICD-10-CM

## 2024-01-28 DIAGNOSIS — K52832 Lymphocytic colitis: Secondary | ICD-10-CM | POA: Diagnosis not present

## 2024-01-28 DIAGNOSIS — K224 Dyskinesia of esophagus: Secondary | ICD-10-CM

## 2024-01-28 DIAGNOSIS — K219 Gastro-esophageal reflux disease without esophagitis: Secondary | ICD-10-CM | POA: Diagnosis not present

## 2024-01-28 DIAGNOSIS — R7401 Elevation of levels of liver transaminase levels: Secondary | ICD-10-CM

## 2024-01-28 DIAGNOSIS — R5383 Other fatigue: Secondary | ICD-10-CM | POA: Diagnosis not present

## 2024-01-28 MED ORDER — NORTRIPTYLINE HCL 50 MG PO CAPS: 50.0000 mg | ORAL_CAPSULE | Freq: Every day | ORAL | 2 refills | Status: AC

## 2024-01-28 NOTE — Progress Notes (Signed)
 GI Office Note    Referring Provider: Sheryle Carwin, MD Primary Care Physician:  Sheryle Carwin, MD Primary Gastroenterologist: Carlin POUR. Cindie, DO  Date:  01/28/2024  ID:  Tonya Hall, DOB 02-25-1951, MRN [id number]  Chief Complaint   Chief Complaint  Patient presents with   Follow-up    Follow up. Still having abdominal pain and sometime diarrhea.    History of Present Illness  Tonya Hall is a 73 y.o. female with a history of shingles, Raynaud's, psoriatic arthritis, osteoporosis, lymphocytic colitis, HLD, HTN, GERD, asthma, and diabetes presenting today with complaint of intermittent diarrhea and stomach cramps.   Colonoscopy 04/2016: -distal ileum norma -internal/external hemorrhoids -repeat colonoscopy 10 years.   EGD 11/2022: -small hiatal hernia -mild Schatzki ring s/p dilation -gastritis, no h.pylori   RUQ US  02/21/2023 with hepatic steatosis otherwise unremarkable.   HIDA scan 03/22/2023 IMPRESSION: - Patent cystic and common bile ducts. - Elevated gallbladder ejection fraction as can be seen with gallbladder hyperkinesia.   Underwent cholecystectomy January 2025. Path with chronic cholecystitis.    MRI/MRCP 06/17/2023 IMPRESSION: - Status post cholecystectomy. - No biliary dilatation. No calculi or other obstruction identified.   CT A/P 08/08/2023 IMPRESSION: - No specific abnormality is identified to explain the patient's right lower quadrant pain. - Mildly mobile cecum in the right upper abdomen - evidence of cholecystectomy.  - Small to moderate-sized hiatal hernia. - Scattered colonic diverticula. - Progressive healing fractures of the left pubic rami. - Late phase healing of a left lower rib fracture.   Alpha gal panel neg 5/19.   OV 08/21/23.  Reportedly had been doing well and back to baseline up until her ER visit on 5/8 with nausea, vomiting, right lower quadrant abdominal pain.  Mild leukocytosis noted on labs, mild hyponatremia.  Normal LFTs  on ED visit.  Cyclical vomiting syndrome remains on differential s/p cholecystectomy.  Advised to continue pantoprazole  twice daily and trial L-carnitine and coenzyme Q 10, follow-up in 6 to 8 weeks.   OV 09/12/23. Urgent visit for severe recurrent right lower quadrant abdominal pain with vomiting.  She had been out of town for a wedding and got in late the evening prior and was assisting her sister for surgery.  She had eaten some nuts and then sat down and had a protein shake for breakfast Introl mix while sitting with her sister for surgery and then at lunchtime had a cheeseburger and fries for lunch but did not eat the bread.  After taking her sister home she went to sit down and then began having pain and vomiting.  States her vomit looked like Coke which she had drink the night before prior to going to bed.  Noted a history of diverticulitis once in the past and reports he has had recurrent vomiting on and off for a month and a half and had been taking L-carnitine and co-Q10.  Not having any appetite issues but was afraid to eat.  Does note that each time sound like this is happen she is eaten something different than she normally would.  Had been on nortriptyline  for about 2 years and was previously on Cymbalta  which caused severe diarrhea.  Denies any constipation.  Unsure if she is lactose intolerant.  No issues with weight.  Taking pantoprazole  twice daily for reflux.    10/07/23: Reportedly had been sick for 2-3 days and then was vomiting 1-2 times every day.  Also having mild loose stools since the Thursday prior without major  pain and only eating small amounts, only vomiting liquids and not food.  She states no Zofran  was available at [facility].  She eventually requested Phenergan  given she did not like the Zofran  was really helping the nausea or the knot in her stomach.  Having 4-5 stools daily with some solid pieces and not pure water .  No fever.  She increase the nortriptyline  to 2 and was  tolerating it well.  Phenergan  was sent in for her, discussed possible side effects.  Last office visit 10/31/2023. Advised to continue nortriptyline  50 mg nightly with L-carnitine and coq.10.  Advise trial of Voquezna 20 mg.  May continue Zofran  and Phenergan  as needed.  Advised to monitor for signs of obstruction given mobile cecum.  Advised we could perform a gastric emptying study if having worsening nausea versus trial of Carafate given possible uncontrolled gastritis or functional dyspepsia contributing to nausea and upper abdominal pain.  She felt as though the Voquezna was just as effective as the pantoprazole  but she reported a tooth infection recently as well was on amoxicillin which she states did not usually bother her stomach however she stopped it on few days before hand just in case it was the cause of her nausea and she reported feeling better and was going to call her oral surgeon regarding this.  She has resumed the pantoprazole  for then and asked what she should do.  We discussed we waiting until she cleared up from the ENT/oral infection before definitively changing her medication.  Advised potentially retrying Voquezna once all this improved.  On 9/29 she reported she was still having nausea and vomiting about every 4 to 6 weeks and states she seen her pain doctor who thought some of this could be abdominal migraines therefore gave her sumatriptan  to try which she states did not help, she increase the Phenergan  to 25 mg Q6 which does help some more than the 12.5 and states that Zofran  does not really help either.  Since I had mentioned Carafate or testing for gastroparesis she asked if we could wait until her office visit to do this.  I gave her options including Carafate, GES, or both or doing another trial of Voquezna or staying on pantoprazole .  She requested to give Carafate a try.  Today:  Discussed the use of AI scribe software for clinical note transcription with the patient, who  gave verbal consent to proceed.  She has experienced recent episodes of diarrhea characterized by urgency and watery stools, with the most recent episode occurring suddenly the previous night, preceded by a stomachache. These episodes have been intermittent, often associated with her 'sickness' and sometimes accompanied by abdominal pain. No recent dietary changes have been identified as triggers, although she occasionally consumes low-carb ice cream and Premier protein drinks, which she believes do not contain lactose.No melena or brbpr. Overally eats well but does have to eat smaller meals.   She has a history of lymphocytic colitis, previously treated with steroids. She does not believe the recent episode is related to this condition, as it typically results in prolonged symptoms rather than a single occurrence. Her bowel movements have been mostly normal, though she has experienced waking up at 3 or 4 AM to have a bowel movement on a few occasions, which is not typical for her, but this happens periodically and is not new. Her weight has remained stable, fluctuating slightly but not significantly.  She has not experienced vomiting for the past six to eight weeks, which  is an improvement from previous episodes of chronic nausea. She is currently taking Carafate, which she finds difficult to remember to take four times a day, but manages to take it three times most days. She also takes L-carnitine, CoQ10, pantoprazole , and nortriptyline .      Wt Readings from Last 6 Encounters:  01/28/24 119 lb (54 kg)  01/15/24 122 lb 9.6 oz (55.6 kg)  11/20/23 119 lb (54 kg)  10/31/23 118 lb 3.2 oz (53.6 kg)  10/30/23 120 lb (54.4 kg)  09/23/23 121 lb (54.9 kg)    Body mass index is 24.87 kg/m.   Current Outpatient Medications  Medication Sig Dispense Refill   albuterol  (PROVENTIL  HFA;VENTOLIN  HFA) 108 (90 BASE) MCG/ACT inhaler Inhale 2 puffs into the lungs every 6 (six) hours as needed for wheezing or  shortness of breath.      aspirin  81 MG tablet Take 81 mg by mouth daily.     Hall Glucose Monitoring Suppl (ONETOUCH VERIO FLEX SYSTEM) w/Device KIT      cetirizine (ZYRTEC) 10 MG tablet Take 10 mg by mouth daily as needed (allergies).     cholecalciferol (VITAMIN D3) 25 MCG (1000 UNIT) tablet Take 1,000 Units by mouth at bedtime.     Coenzyme Q10 400 MG CAPS as directed Orally     denosumab  (PROLIA ) 60 MG/ML SOSY injection as directed Subcutaneous     diazepam  (VALIUM ) 5 MG tablet Take 1 tablet (5 mg total) by mouth every 6 (six) hours as needed for muscle spasms (resistant N/V). 20 tablet 2   empagliflozin  (JARDIANCE ) 25 MG TABS tablet Take 25 mg by mouth daily.     fish oil-omega-3 fatty acids  1000 MG capsule Take 2 capsules (2 g total) by mouth daily. 2-4 GRAMS DAILY     fluorouracil (EFUDEX) 5 % cream 1 Application.     gabapentin  (NEURONTIN ) 600 MG tablet TAKE ONE TABLET BY MOUTH THREE TIMES DAILY. 90 tablet 5   Ginger , Zingiber officinalis, (GINGER  ROOT) 500 MG CAPS Take 500 mg by mouth daily.     Glucosamine Sulfate 1000 MG TABS Take 2,000 mg by mouth daily.     hydrocortisone  sodium succinate  (SOLU-CORTEF ) 100 MG SOLR injection Inject 100 mg into the muscle daily as needed (when unable to keep down prednisone  tablet).      Lancets (ONETOUCH DELICA PLUS LANCET33G) MISC USE TO TEST 3 TIMESUDAILY.     leflunomide  (ARAVA ) 20 MG tablet Take 1 tablet (20 mg total) by mouth daily.     Levocarnitine 500 MG TABS 1 tablet Orally Twice a day     metoprolol  succinate (TOPROL -XL) 50 MG 24 hr tablet Take 1 tablet (50 mg total) by mouth daily. Take with or immediately following a meal.     Misc Natural Products (TART CHERRY ADVANCED PO) Take 3,000 mg by mouth daily. daily     Multiple Vitamin (MULTIVITAMIN) tablet Take 1 tablet by mouth 3 (three) times a week.     olmesartan (BENICAR) 40 MG tablet Take 40 mg by mouth daily.     ondansetron  (ZOFRAN -ODT) 4 MG disintegrating tablet Take 1 tablet (4 mg  total) by mouth every 8 (eight) hours as needed for nausea. 30 tablet 1   ONETOUCH VERIO test strip      OTEZLA  30 MG TABS TAKE ONE TABLET BY MOUTH TWICE DAILY 180 tablet 0   oxybutynin  (DITROPAN -XL) 5 MG 24 hr tablet Take 1 tablet (5 mg total) by mouth at bedtime. 90 tablet 3   oxyCODONE  (  ROXICODONE ) 5 MG immediate release tablet Take 1 tablet (5 mg total) by mouth 3 (three) times daily as needed for moderate pain (pain score 4-6). G89- for chronic pain 90 tablet 0   pantoprazole  (PROTONIX ) 40 MG tablet TAKE (1) TABLET BY MOUTH TWICE DAILY. 180 tablet 3   Polyethyl Glycol-Propyl Glycol (SYSTANE OP) Place 1 drop into both eyes 4 (four) times daily as needed (dry eyes).     potassium chloride  SA (KLOR-CON  M) 20 MEQ tablet Take 20 mEq by mouth daily.     predniSONE  (DELTASONE ) 5 MG tablet TAKE 1 TABLET BY MOUTH DAILY WITH BREAKFAST 90 tablet 0   promethazine  (PHENERGAN ) 25 MG tablet Take 0.5-1 tablets (12.5-25 mg total) by mouth every 6 (six) hours as needed for nausea or vomiting. Only take in between zofran  doses. 120 tablet 5   spironolactone  (ALDACTONE ) 25 MG tablet Take 25 mg by mouth daily.     sucralfate (CARAFATE) 1 g tablet Take 1 tablet (1 g total) by mouth 4 (four) times daily for 21 days. 120 tablet 1   SUMAtriptan  (IMITREX ) 100 MG tablet Take 1 tablet (100 mg total) by mouth every 2 (two) hours as needed for migraine (abdominal migraine- within 15 minutes of migraine). May repeat in 2 hours if headache persists or recurs. 12 tablet 5   TALTZ  80 MG/ML pen INJECT 80MG  (1 PEN) UNDER THE SKIN EVERY 28 DAYS. 3 mL 0   topiramate  (TOPAMAX ) 200 MG tablet TAKE (1) TABLET BY MOUTH ONCE AT BEDTIME. 90 tablet 0   Turmeric 500 MG CAPS Take 500 mg by mouth every morning.     chlorhexidine  (PERIDEX ) 0.12 % solution Use as directed 15 mLs in the mouth or throat 2 (two) times daily. (Patient taking differently: Use as directed 15 mLs in the mouth or throat as needed.)     nortriptyline  (PAMELOR ) 25 MG  capsule TAKE (1) CAPSULE BY MOUTH AT BEDTIME. (Patient taking differently: 50 mg.) 30 capsule 0   No current facility-administered medications for this visit.    Past Medical History:  Diagnosis Date   Asthma    Albuterol  in haler prn   Cataract    immature unsure which eye   Chronic back pain    COVID-19 October/November 2020   Degenerative disk disease    psoriatic   Diabetes mellitus without complication (HCC)    diet controlled   Elevated liver enzymes    r/t leflunamide (this medication is put on hold intermittently due to this)   Esophageal motility disorder    Non-specific, see modified barium study/speech path, BP   GERD (gastroesophageal reflux disease)    takes Protonix  bid   History of Hall transfusion    post c-section   History of bronchitis    History of hiatal hernia    HTN (hypertension)    Hyperlipidemia    Lymphocytic colitis 05/26/2010   Responded to Entocort x 3 MOS   Neuropathy    Nonallergic rhinitis    Osteoporosis    gets Prolia  every 6 months   Peripheral edema    Pneumonia 02/2019   Psoriatic arthritis (HCC)    Dr. Elin   Psoriatic arthritis [facility])    bilateral hands   Raynaud's disease    Seasonal allergies    Shingles 2020   SUI (stress urinary incontinence, female)    Urinary urgency     Past Surgical History:  Procedure Laterality Date   ANTERIOR CERVICAL DECOMP/DISCECTOMY FUSION N/A 03/09/2020   Procedure: ANTERIOR  CERVICAL DECOMPRESSION/DISCECTOMY FUSION, INTERBODY PROSTHESIS, PLATE SCREWS CERVICAL FIVE-SIX, CERVICAL SIX-SEVEN;  Surgeon: Mavis Purchase, MD;  Location: [facility] OR;  Service: Neurosurgery;  Laterality: N/A;   BACK SURGERY  2003   BALLOON DILATION N/A 11/12/2022   Procedure: BALLOON DILATION;  Surgeon: Cindie Carlin POUR, DO;  Location: [facility];  Service: Endoscopy;  Laterality: N/A;   BIOPSY  04/17/2016   Procedure: BIOPSY;  Surgeon: Margo LITTIE Haddock, MD;  Location: [facility];  Service: Endoscopy;;  random  colon bx's   BIOPSY  11/12/2022   Procedure: BIOPSY;  Surgeon: Cindie Carlin POUR, DO;  Location: [facility];  Service: Endoscopy;;   CERVICAL SPINE SURGERY  09/09/2017   CESAREAN SECTION  1974, 1978       CHOLECYSTECTOMY  04/2023   COLONOSCOPY  06/19/2008   DOQ:dfjoo internal hemorrhoids/mild sigmoin colon diverticulosis   COLONOSCOPY  2012   Dr. Shaaron: normal rectum, diverticula, lymphocytic colitis    COLONOSCOPY WITH PROPOFOL  N/A 04/17/2016   Dr. Haddock: Normal terminal ileum, internal/external hemorrhoids, random colon biopsies consistent with collagenous colitis   DILATION AND CURETTAGE OF UTERUS  1977   spontaneous abortion   ESOPHAGOGASTRODUODENOSCOPY  06/19/2008   DOQ:fnizmjuz gastritis   ESOPHAGOGASTRODUODENOSCOPY (EGD) WITH PROPOFOL  N/A 11/12/2022   Procedure: ESOPHAGOGASTRODUODENOSCOPY (EGD) WITH PROPOFOL ;  Surgeon: Cindie Carlin POUR, DO;  Location: [facility];  Service: Endoscopy;  Laterality: N/A;  8:30 am, asa 3   LUMBAR DISC ARTHROPLASTY  09/1996   LUMBAR FUSION  6/03, 11/08, 11/14   L4-5 fusion, L2-3 fusion, L1-2   LUMBAR FUSION  08/03/2020   L4-L5   LUMBAR LAMINECTOMY  03/2020   MOUTH SURGERY  12/07/2021   oral surgery   ODONTOID SCREW INSERTION N/A 01/27/2018   Procedure: ODONTOID SCREW INSERTION;  Surgeon: Mavis Purchase, MD;  Location: [facility] OR;  Service: Neurosurgery;  Laterality: N/A;  ODONTOID SCREW INSERTION   THORACIC DISCECTOMY N/A 07/18/2023   Procedure: THORACIC DISCECTOMY THORACIC ELEVEN-TWELVE DISCECTOMY;  Surgeon: Mavis Purchase, MD;  Location: [facility] OR;  Service: Neurosurgery;  Laterality: N/A;  T11-T   TONSILLECTOMY     TONSILLECTOMY AND ADENOIDECTOMY     TUBAL LIGATION      Family History  Problem Relation Age of Onset   Diabetes Mother    Pancreatitis Mother    Arthritis Mother        psoriatic    Fibromyalgia Mother    Rheumatic fever Father    Diabetes Other        grandparents   Skin cancer Other        grandfather    Hypertension Other        grandparent   Congestive Heart Failure Other        grandfather   Parkinson's disease Other        grandmother   Colon cancer Maternal Uncle    Fibromyalgia Sister    Rheum arthritis Sister    Diabetes Sister    Fibromyalgia Sister    Diabetes Sister    Rheum arthritis Sister    Hypertension Son    Colon polyps Neg Hx     Allergies as of 01/28/2024 - Review Complete 01/28/2024  Allergen Reaction Noted   Adalimumab Rash and Other (See Comments) 05/09/2021   Imuran [azathioprine sodium] Rash 07/03/2011   Methotrexate Other (See Comments) 05/09/2021   Plaquenil [hydroxychloroquine sulfate] Rash 07/03/2011   Sulfonamide derivatives Rash    Ceftin [cefuroxime axetil] Nausea Only 07/06/2014   Lisinopril Cough 09/22/2021   Sulfamethoxazole-trimethoprim  09/22/2021   Morphine Nausea And Vomiting     Social History   Socioeconomic History   Marital status: Married    Spouse name: Not on file   Number of children: 2   Years of education: Not on file   Highest education level: Not on file  Occupational History   Occupation: Case Midwife    Employer: [employer]    Comment: Estate Manager/land Agent  Tobacco Use   Smoking status: Never    Passive exposure: Never   Smokeless tobacco: Never  Vaping Use   Vaping status: Never Used  Substance and Sexual Activity   Alcohol  use: No   Drug use: No   Sexual activity: Not Currently    Partners: Male    Birth control/protection: Surgical    Comment: BTL  Other Topics Concern   Not on file  Social History Narrative   ATTENDS [employer]. 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   Social Drivers of Corporate Investment Banker Strain: Not on file  Food Insecurity: No Food Insecurity (07/18/2023)   Hunger Vital Sign    Worried About Running Out of Food in the Last Year: Never true    Ran Out of Food in the Last Year: Never true  Transportation Needs: No Transportation Needs (07/18/2023)   PRAPARE - Therapist, Art (Medical): No    Lack of Transportation (Non-Medical): No  Physical Activity: Not on file  Stress: Not on file  Social Connections: Socially Integrated (07/18/2023)   Social Connection and Isolation Panel    Frequency of Communication with Friends and Family: More than three times a week    Frequency of Social Gatherings with Friends and Family: Three times a week    Attends Religious Services: More than 4 times per year    Active Member of Clubs or Organizations: Yes    Attends Banker Meetings: More than 4 times per year    Marital Status: Married    Review of Systems   Gen: + weakness. Denies fever, chills, anorexia. Denies weight loss.  CV: Denies chest pain, palpitations, syncope, peripheral edema, and claudication. Resp: Denies dyspnea at rest, cough, wheezing, coughing up Hall, and pleurisy. GI: See HPI Derm: Denies rash, itching, dry skin Psych: Denies depression, anxiety, memory loss, confusion. No homicidal or suicidal ideation.  Heme: Denies bruising, bleeding, and enlarged lymph nodes. + unsteady gait + dental/oral pain  Physical Exam   BP 139/68 (BP Location: Right Arm, Patient Position: Sitting, Cuff Size: Normal)   Pulse 87   Temp 97.8 F (36.6 C) (Temporal)   Ht 4' 10 (1.473 m)   Wt 119 lb (54 kg)   LMP 02/01/1999   BMI 24.87 kg/m   General:   Alert and oriented. No distress noted. Pleasant and cooperative.  Head:  Normocephalic and atraumatic. Eyes:  Conjuctiva clear without scleral icterus. Mouth:  Oral mucosa pink and moist. Good dentition. No lesions. Abdomen:  +BS, soft, non-distended. Mild ttp to epigastrium. No rebound or guarding. No HSM or masses noted. Rectal: deferred Msk:  Symmetrical without gross deformities. Normal posture.using 4 point cane.  Extremities:  Without edema.  Neurologic:  Alert and  oriented x4 Psych:  Alert and cooperative. Normal mood and affect.  Assessment & Plan  Tonya Hall is  a 73 y.o. female presenting today with complaints of ongoing intermittent nausea, abdominal pain, and diarrhea.     Chronic nausea and vomiting with history of GERD Nausea and  vomiting have improved, with no episodes in the past six weeks. EGD August 2024 with gastritis and biopsies negative for H. Pylori. Pantoprazole  is preferred over Voquezna due to cost and similar efficacy. Carafate is being used, though not consistently four times a day, mostly 3/day. L-carnitine and CoQ10 are being used for chronic nausea, with L-carnitine potentially contributing to loose stools. Nortriptyline  may be increased if needed, currently stable on 50 mg nightly. May need to consider evaluation for gastroparesis with GES.  - Continue pantoprazole  40 mg BID.  - Continue Carafate 1g QID and update provider at the end of the three-week course. May consider ongoing 2-3 times daily as needed pending return of symptoms after 3 weeks course complete.  - Continue Zofran  as needed and Phenergan  as needed for severe refractory symptoms.  - Reduce L-carnitine to 1000 mg once daily to see if it reduces loose stools. - Continue CoQ10 as currently dosed twice daily.  - Discontinue 25 mg nortriptyline  tablets (2 of them) and start 50 mg capsules nightly. - Consider increasing nortriptyline  if nausea returns.  Chronic intermittent diarrhea and abdominal pain Recent episode of diarrhea described as watery and urgent, but not consistent with previous lymphocytic colitis flares. Possible dietary triggers considered, including lactose intolerance and L-carnitine. Bowel movements have been mostly normal, with occasional early morning stools. Gallbladder removal may contribute to looser stools. - Monitor bowel movements and report any increase in frequency or nighttime stools. - Consider dietary review for potential triggers, including lactose. - If symptoms persist, consider colonoscopy to rule out other causes, assess for  EPI.  Lymphocytic (microscopic) colitis Previous flares have been more persistent. Current symptoms do not align with typical colitis flare pattern. Budesonide  is avoided due to potential exacerbation of other infections including issues with oral/dental infections.  - Monitor for symptoms consistent with colitis flare. - Consider stool studies and colonoscopy if symptoms suggestive of colitis return.  Elevated liver enzymes Liver enzymes are stable but have previously been elevated, possibly due to medication use, including Arava . Current ALT is borderline elevated at 30. Arava  dosing has been adjusted in the past based on liver enzyme levels. - Monitor liver enzyme levels regularly. - Avoid Tylenol  > 2g daily to prevent further elevation of liver enzymes. - Adjust Arava  dosing as needed based on liver enzyme levels - per prescribing provider.      Follow up   Follow up 2 months, Dr. Cindie if able.     Charmaine Melia, MSN, FNP-BC, AGACNP-BC [facility]

## 2024-01-28 NOTE — Patient Instructions (Addendum)
 Continue your nortriptyline , Co-Q-10 and L carnitine.  For your nortriptyline  I sent in the 50 mg capsules for you instead of having to take 2 of the 25's.  Continue pantoprazole  40 mg twice daily.  May continue Zofran  as needed for severe refractory nausea, or the Phenergan .   Continue the complete course of carafate, once done with the 21 days you can just take as needed and monitor for worsening symptoms.  Please keep me updated via MyChart.  Follow up in 2 months.   It was a pleasure to see you today. I want to create trusting relationships with patients. If you receive a survey regarding your visit,  I greatly appreciate you taking time to fill this out on paper or through your MyChart. I value your feedback.  Charmaine Melia, MSN, FNP-BC, AGACNP-BC [facility]

## 2024-01-28 NOTE — Progress Notes (Signed)
  Subjective:  Patient ID: Tonya Hall, female    DOB: November 04, 1950,  MRN: [id number]  Tonya Hall presents to clinic today for at risk foot care with history of diabetic neuropathy and callus(es) of both feet. Aggravating factors include weightbearing with and without shoe gear. Pain is relieved with periodic professional debridement.  Chief Complaint  Patient presents with   Diabetes     [facility] NIDDM A1C 5.8 toenail trim. LOV with PCP 10/15/23.     New problem(s): None.   PCP is Sheryle Carwin, MD.  Allergies  Allergen Reactions   Adalimumab Rash and Other (See Comments)    FEVER, Fast pulse Humira   Imuran [Azathioprine Sodium] Rash    Denies Airway involvement   Methotrexate Other (See Comments)    Liver Enzyme elevation - after 1 month of use Other reaction(s): Unknown   Plaquenil [Hydroxychloroquine Sulfate] Rash    Denies airway involvement.    Sulfonamide Derivatives Rash    Occurred with Sulfasalazine. Rash only.    Ceftin [Cefuroxime Axetil] Nausea Only    Upset stomach   Lisinopril Cough   Sulfamethoxazole-Trimethoprim     Other reaction(s): Unknown   Morphine Nausea And Vomiting    IV morphine caused N and V  After surgery.   Has tolerated Kadian in the past     Review of Systems: Negative except as noted in the HPI.  Objective:  There were no vitals filed for this visit. Tonya Hall is a pleasant 73 y.o. female WD, WN in NAD. AAO x 3.  Vascular Examination: Vascular status intact b/l with palpable pedal pulses. CFT immediate b/l. No edema. No pain with calf compression b/l. Skin temperature gradient WNL b/l. Purlple hue consistent with Raynaud's b/l.  Neurological Examination: Pt has subjective symptoms of neuropathy. Sensation grossly intact b/l with 10 gram monofilament. Vibratory sensation intact b/l.   Dermatological Examination: Pedal skin with normal turgor, texture and tone b/l. Toenails 1-5 bilaterally well maintained with adequate length. No  erythema, no edema, no drainage, no fluctuance. Hyperkeratotic lesion(s) submet head 2 b/l.  No erythema, no edema, no drainage, no fluctuance. Musculoskeletal Examination: Muscle strength 5/5 to b/l LE. HAV with bunion deformity noted b/l LE. Hammertoe deformity noted 2-5 b/l.  Radiographs: None  Assessment/Plan: 1. Callus   2. Diabetic peripheral neuropathy associated with type 2 diabetes mellitus (HCC)   -Consent given for treatment as described below: -Examined patient. -Continue foot and shoe inspections daily. Monitor Hall glucose per PCP/Endocrinologist's recommendations. -Callus(es) submet head 2 b/l pared utilizing sterile scalpel blade without complication or incident. Total number debrided =2. -Patient/POA to call should there be question/concern in the interim.   Return in about 3 months (around 04/23/2024).  Delon LITTIE Merlin, DPM      [place] LOCATION: [address], [place] [address]                   Office [phone]   [facility] LOCATION: [address] [place], [place] [address] Office [phone]

## 2024-01-29 ENCOUNTER — Ambulatory Visit: Attending: Physician Assistant | Admitting: Physician Assistant

## 2024-01-29 ENCOUNTER — Encounter: Payer: Self-pay | Admitting: Physician Assistant

## 2024-01-29 ENCOUNTER — Telehealth: Payer: Self-pay

## 2024-01-29 VITALS — BP 118/70 | HR 89 | Temp 97.7°F | Resp 16 | Ht <= 58 in | Wt 120.6 lb

## 2024-01-29 DIAGNOSIS — M5134 Other intervertebral disc degeneration, thoracic region: Secondary | ICD-10-CM

## 2024-01-29 DIAGNOSIS — M19071 Primary osteoarthritis, right ankle and foot: Secondary | ICD-10-CM | POA: Diagnosis not present

## 2024-01-29 DIAGNOSIS — G894 Chronic pain syndrome: Secondary | ICD-10-CM

## 2024-01-29 DIAGNOSIS — M810 Age-related osteoporosis without current pathological fracture: Secondary | ICD-10-CM

## 2024-01-29 DIAGNOSIS — M5030 Other cervical disc degeneration, unspecified cervical region: Secondary | ICD-10-CM

## 2024-01-29 DIAGNOSIS — E2740 Unspecified adrenocortical insufficiency: Secondary | ICD-10-CM

## 2024-01-29 DIAGNOSIS — M19072 Primary osteoarthritis, left ankle and foot: Secondary | ICD-10-CM

## 2024-01-29 DIAGNOSIS — M170 Bilateral primary osteoarthritis of knee: Secondary | ICD-10-CM | POA: Diagnosis not present

## 2024-01-29 DIAGNOSIS — M7062 Trochanteric bursitis, left hip: Secondary | ICD-10-CM

## 2024-01-29 DIAGNOSIS — Z7952 Long term (current) use of systemic steroids: Secondary | ICD-10-CM | POA: Diagnosis not present

## 2024-01-29 DIAGNOSIS — M7061 Trochanteric bursitis, right hip: Secondary | ICD-10-CM

## 2024-01-29 DIAGNOSIS — M533 Sacrococcygeal disorders, not elsewhere classified: Secondary | ICD-10-CM | POA: Diagnosis not present

## 2024-01-29 DIAGNOSIS — G8929 Other chronic pain: Secondary | ICD-10-CM

## 2024-01-29 DIAGNOSIS — M51369 Other intervertebral disc degeneration, lumbar region without mention of lumbar back pain or lower extremity pain: Secondary | ICD-10-CM | POA: Diagnosis not present

## 2024-01-29 DIAGNOSIS — Z111 Encounter for screening for respiratory tuberculosis: Secondary | ICD-10-CM

## 2024-01-29 DIAGNOSIS — I7300 Raynaud's syndrome without gangrene: Secondary | ICD-10-CM

## 2024-01-29 DIAGNOSIS — L4050 Arthropathic psoriasis, unspecified: Secondary | ICD-10-CM | POA: Diagnosis not present

## 2024-01-29 DIAGNOSIS — R7989 Other specified abnormal findings of blood chemistry: Secondary | ICD-10-CM

## 2024-01-29 DIAGNOSIS — Z8639 Personal history of other endocrine, nutritional and metabolic disease: Secondary | ICD-10-CM

## 2024-01-29 DIAGNOSIS — Z8719 Personal history of other diseases of the digestive system: Secondary | ICD-10-CM

## 2024-01-29 DIAGNOSIS — Z9049 Acquired absence of other specified parts of digestive tract: Secondary | ICD-10-CM

## 2024-01-29 DIAGNOSIS — D692 Other nonthrombocytopenic purpura: Secondary | ICD-10-CM

## 2024-01-29 DIAGNOSIS — K52831 Collagenous colitis: Secondary | ICD-10-CM

## 2024-01-29 DIAGNOSIS — I10 Essential (primary) hypertension: Secondary | ICD-10-CM

## 2024-01-29 DIAGNOSIS — Z79899 Other long term (current) drug therapy: Secondary | ICD-10-CM | POA: Diagnosis not present

## 2024-01-29 DIAGNOSIS — L409 Psoriasis, unspecified: Secondary | ICD-10-CM

## 2024-01-29 DIAGNOSIS — K224 Dyskinesia of esophagus: Secondary | ICD-10-CM

## 2024-01-29 DIAGNOSIS — E1142 Type 2 diabetes mellitus with diabetic polyneuropathy: Secondary | ICD-10-CM

## 2024-01-29 NOTE — Telephone Encounter (Signed)
 Per Waddell Craze, please apply for bilateral visco knee injections. Thank you.

## 2024-01-29 NOTE — Telephone Encounter (Signed)
 Patient completed her portion of [employer]  PAP renewal (will need to call her for income information once ready to submit). Provider form placed in Waddell Craze, PA-C's folder for signature  Submitted a Prior Authorization RENEWAL request to [facility] ADVANTAGE/RX ADVANCE for TALTZ  via CoverMyMeds. Will update once we receive a response.  Key: [id number]   Per automated response: Prior Authorization duplicate/approved  Printed PA approval for 2025

## 2024-01-29 NOTE — Telephone Encounter (Signed)
 VOB submitted for Orthovisc, Bilateral knee(s) BV pending

## 2024-01-30 ENCOUNTER — Ambulatory Visit: Payer: Self-pay | Admitting: Physician Assistant

## 2024-01-30 NOTE — Progress Notes (Signed)
 Hgb A1c is 6.1%-increased since last checked.  She remains on long term prednisone  5 mg daily.  Please forward results to PCP.

## 2024-01-30 NOTE — Telephone Encounter (Signed)
 Please call to schedule visco injections.  Approved for Orthovisc, Bilateral knee(s). [employer] Deductible does not apply $20 copay Once the OOP is met $3400 (met [phone]) patient is covered at 100% Prior authorization is not required Confirmed by CHARLCIE [id number]

## 2024-01-30 NOTE — Telephone Encounter (Signed)
 Received signed provider forms from Waddell Craze, PA-C. Called patient and collected income information.  Submitted Patient Assistance RENEWAL Application to [employer] for TALTZ  with patient portion, provider portion, insurance card copy, prior authorization approval, and current medication list. Will update patient when we receive a response.  Phone: [phone] Fax: [phone]

## 2024-01-31 ENCOUNTER — Ambulatory Visit: Admitting: Physician Assistant

## 2024-01-31 LAB — QUANTIFERON-TB GOLD PLUS
Mitogen-NIL: 8.2 [IU]/mL
NIL: 0.02 [IU]/mL
QuantiFERON-TB Gold Plus: NEGATIVE
TB1-NIL: <0 [IU]/mL
TB2-NIL: 0 [IU]/mL

## 2024-01-31 LAB — HEMOGLOBIN A1C
Hgb A1c MFr Bld: 6.1 % — ABNORMAL HIGH (ref <5.7)
Mean Plasma Glucose: 128 mg/dL
eAG (mmol/L): 7.1 mmol/L

## 2024-01-31 NOTE — Telephone Encounter (Signed)
 Received a fax from [facility] regarding an approval for TALTZ  patient assistance from 04/02/2024 to 04/01/2025. Approval letter sent to scan center.  Phone: [phone] Fax: [phone] Case [id number]  Sherry Pennant, PharmD, MPH, BCPS, CPP Clinical Pharmacist [facility])

## 2024-02-02 NOTE — Progress Notes (Signed)
 TB gold negative

## 2024-02-03 ENCOUNTER — Other Ambulatory Visit: Payer: Self-pay | Admitting: *Deleted

## 2024-02-03 DIAGNOSIS — L409 Psoriasis, unspecified: Secondary | ICD-10-CM

## 2024-02-03 DIAGNOSIS — L4050 Arthropathic psoriasis, unspecified: Secondary | ICD-10-CM

## 2024-02-03 DIAGNOSIS — Z79899 Other long term (current) drug therapy: Secondary | ICD-10-CM

## 2024-02-06 ENCOUNTER — Encounter: Payer: Self-pay | Admitting: Internal Medicine

## 2024-02-07 ENCOUNTER — Other Ambulatory Visit (HOSPITAL_BASED_OUTPATIENT_CLINIC_OR_DEPARTMENT_OTHER): Payer: Self-pay | Admitting: Obstetrics & Gynecology

## 2024-02-07 ENCOUNTER — Other Ambulatory Visit: Payer: Self-pay | Admitting: Physician Assistant

## 2024-02-07 ENCOUNTER — Other Ambulatory Visit: Payer: Self-pay | Admitting: Rheumatology

## 2024-02-07 DIAGNOSIS — N3946 Mixed incontinence: Secondary | ICD-10-CM

## 2024-02-07 NOTE — Telephone Encounter (Signed)
 Last Fill: 10/30/2023  Labs: 01/15/2024 Glucose is elevated. Creatinine is elevated most likely due to diuretic use and liver function is mildly elevated. White cell count is elevated probably due to prednisone  use.   Next Visit: 03/05/2024  Last Visit: 01/29/2024  DX: Psoriatic arthritis (HCC)   Current Dose per office note 01/29/2024: Arava  20 mg 1 tablet daily   Okay to refill Arava  ?

## 2024-02-07 NOTE — Telephone Encounter (Signed)
 Last Fill: 10/21/2023  Next Visit: 07/02/2024  Last Visit: 01/29/2024  DX: not mentioned   Current Dose per office note on 01/29/2024: not mentioned.   Okay to refill topamax ?

## 2024-02-07 NOTE — Telephone Encounter (Signed)
 Last Fill: 10/21/2023  Next Visit: 07/02/2024  Last Visit: 01/29/2024  Dx: not discussed  Current Dose per office note on 01/29/2024: not discussed  Okay to refill Topamax ?

## 2024-02-18 ENCOUNTER — Other Ambulatory Visit (HOSPITAL_COMMUNITY): Payer: Self-pay | Admitting: Neurosurgery

## 2024-02-18 DIAGNOSIS — Z681 Body mass index (BMI) 19 or less, adult: Secondary | ICD-10-CM | POA: Diagnosis not present

## 2024-02-18 DIAGNOSIS — M546 Pain in thoracic spine: Secondary | ICD-10-CM | POA: Diagnosis not present

## 2024-02-19 ENCOUNTER — Telehealth: Payer: Self-pay

## 2024-02-19 MED ORDER — GABAPENTIN 600 MG PO TABS: 600.0000 mg | ORAL_TABLET | Freq: Three times a day (TID) | ORAL | 5 refills | Status: AC

## 2024-02-19 NOTE — Telephone Encounter (Signed)
 PMP was Reviewed.  Gabapentin  was filled on 02/07/2024 Call placed to Tonya Hall regarding the above, she reports she doesn't have the medication.  Pharmacy was called Tonya Hall picked up the Gabapentin  on 02/10/2024. Tonya Hall was called regarding the above, she will look for her Gabapentin .

## 2024-02-19 NOTE — Telephone Encounter (Signed)
 Patient stated she is currently out of Gabapentin . Will you fill and send?  It was last filled by Dr. Emeline?

## 2024-02-28 ENCOUNTER — Other Ambulatory Visit: Payer: Self-pay | Admitting: Rheumatology

## 2024-02-28 DIAGNOSIS — L409 Psoriasis, unspecified: Secondary | ICD-10-CM

## 2024-03-02 NOTE — Telephone Encounter (Signed)
 Last Fill: 10/25/2023  Labs: 01/15/2024 Glucose is elevated. Creatinine is elevated most likely due to diuretic use and liver function is mildly elevated. White cell count is elevated probably due to prednisone  use.   Next Visit: 07/02/2024  Last Visit: 01/29/2024  DX: Psoriatic arthritis   Current Dose per office note on 01/29/2024: Otezla  30 mg 1 tablet by mouth twice daily   Okay to refill Otezla ?

## 2024-03-05 ENCOUNTER — Ambulatory Visit: Attending: Physician Assistant | Admitting: Physician Assistant

## 2024-03-05 DIAGNOSIS — M170 Bilateral primary osteoarthritis of knee: Secondary | ICD-10-CM

## 2024-03-05 MED ORDER — LIDOCAINE HCL 1 % IJ SOLN
1.5000 mL | INTRAMUSCULAR | Status: AC | PRN
  Administered 2024-03-05: 1.5 mL

## 2024-03-05 MED ORDER — HYALURONAN 30 MG/2ML IX SOSY
30.0000 mg | PREFILLED_SYRINGE | INTRA_ARTICULAR | Status: AC | PRN
  Administered 2024-03-05: 30 mg via INTRA_ARTICULAR

## 2024-03-05 NOTE — Progress Notes (Signed)
   Procedure Note  Patient: Tonya Hall             Date of Birth: Feb 06, 1951           MRN: [id number]             Visit Date: 03/05/2024  Procedures: Visit Diagnoses:  1. Primary osteoarthritis of both knees    Orthovisc #1 bilateral knees, B/B Large Joint Inj: bilateral knee on 03/05/2024 8:00 AM Indications: pain Details: 25 G 1.5 in needle, medial approach  Arthrogram: No  Medications (Right): 1.5 mL lidocaine  1 %; 30 mg Hyaluronan 30 MG/2ML Aspirate (Right): 0 mL Medications (Left): 1.5 mL lidocaine  1 %; 30 mg Hyaluronan 30 MG/2ML Aspirate (Left): 0 mL Outcome: tolerated well, no immediate complications Procedure, treatment alternatives, risks and benefits explained, specific risks discussed. Consent was given by the patient.     Patient tolerated the procedure well.  Aftercare was discussed. Waddell Craze, PA-C

## 2024-03-09 ENCOUNTER — Ambulatory Visit (HOSPITAL_COMMUNITY)
Admission: RE | Admit: 2024-03-09 | Discharge: 2024-03-09 | Disposition: A | Source: Ambulatory Visit | Attending: Neurosurgery | Admitting: Neurosurgery

## 2024-03-09 DIAGNOSIS — M546 Pain in thoracic spine: Secondary | ICD-10-CM | POA: Diagnosis not present

## 2024-03-10 ENCOUNTER — Telehealth: Payer: Self-pay

## 2024-03-10 NOTE — Telephone Encounter (Signed)
 Patient called asking for a refill on Oxycodone .  [facility] and patient never filled prescription sent for 02/18/24. They are going to fill today.  Called patient to inform her and remind her of her appt with Dr. Lovorn on 12/15

## 2024-03-12 ENCOUNTER — Ambulatory Visit: Attending: Physician Assistant | Admitting: Physician Assistant

## 2024-03-12 DIAGNOSIS — M170 Bilateral primary osteoarthritis of knee: Secondary | ICD-10-CM

## 2024-03-12 MED ORDER — LIDOCAINE HCL 1 % IJ SOLN
1.5000 mL | INTRAMUSCULAR | Status: AC | PRN
  Administered 2024-03-12: 1.5 mL

## 2024-03-12 MED ORDER — HYALURONAN 30 MG/2ML IX SOSY
30.0000 mg | PREFILLED_SYRINGE | INTRA_ARTICULAR | Status: AC | PRN
  Administered 2024-03-12: 30 mg via INTRA_ARTICULAR

## 2024-03-12 NOTE — Progress Notes (Signed)
° °  Procedure Note  Patient: Tonya Hall             Date of Birth: 30-Apr-1950           MRN: [id number]             Visit Date: 03/12/2024  Procedures: Visit Diagnoses:  1. Primary osteoarthritis of both knees    #2 Orthovisc Bilateral Knees B/B  Large Joint Inj: bilateral knee on 03/12/2024 10:05 AM Indications: pain Details: 25 G 1.5 in needle, medial approach  Arthrogram: No  Medications (Right): 1.5 mL lidocaine  1 %; 30 mg Hyaluronan 30 MG/2ML Aspirate (Right): 0 mL Medications (Left): 1.5 mL lidocaine  1 %; 30 mg Hyaluronan 30 MG/2ML Aspirate (Left): 0 mL Outcome: tolerated well, no immediate complications Procedure, treatment alternatives, risks and benefits explained, specific risks discussed. Consent was given by the patient.       Patient tolerated the procedures well. Aftercare was discussed.  Waddell Craze, PA-C

## 2024-03-16 ENCOUNTER — Encounter: Payer: Self-pay | Admitting: Physical Medicine and Rehabilitation

## 2024-03-16 ENCOUNTER — Encounter: Attending: Physical Medicine and Rehabilitation | Admitting: Physical Medicine and Rehabilitation

## 2024-03-16 VITALS — BP 184/96 | HR 96 | Ht <= 58 in | Wt 120.0 lb

## 2024-03-16 DIAGNOSIS — Z79891 Long term (current) use of opiate analgesic: Secondary | ICD-10-CM | POA: Insufficient documentation

## 2024-03-16 DIAGNOSIS — M5134 Other intervertebral disc degeneration, thoracic region: Secondary | ICD-10-CM | POA: Insufficient documentation

## 2024-03-16 DIAGNOSIS — Z5181 Encounter for therapeutic drug level monitoring: Secondary | ICD-10-CM | POA: Insufficient documentation

## 2024-03-16 DIAGNOSIS — G894 Chronic pain syndrome: Secondary | ICD-10-CM | POA: Insufficient documentation

## 2024-03-16 DIAGNOSIS — M542 Cervicalgia: Secondary | ICD-10-CM

## 2024-03-16 MED ORDER — DIAZEPAM 5 MG PO TABS: 5.0000 mg | ORAL_TABLET | Freq: Four times a day (QID) | ORAL | 5 refills | Status: AC | PRN

## 2024-03-16 MED ORDER — OXYCODONE HCL 5 MG PO TABS: 5.0000 mg | ORAL_TABLET | Freq: Three times a day (TID) | ORAL | 0 refills | Status: AC | PRN

## 2024-03-16 NOTE — Patient Instructions (Signed)
 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  L hip bursitis and also has SI joint pain more on L- as well as mid thoracic acute on chronic pain.   Here for f/u on chronic pain from back and psoriatic arthritis.     Go home and take today's meds- and don't miss them again today.  2. No symptoms of stroke- but be careful- and if any symptoms, call 911.  TIME + BRAIN!!!    3.  Suggest to have PCP check since so sleepy all the time- Thyroid  labs, , vit D levels, no nerve pain or Numbness/tingling more than normal- no hair loss-  so don't see copper or folate level to be checked. Also check for Lyme's disease.    4.   Did oral drug screen today- is due per clinic policy.    5. Con't Valium  as needed for resistant nausea and vomiting and muscle spasms-  Sent in refills 20 pills 5 refills  6. Con't Oxycodone  5 mg- #90- for pain- G89.2- chornic pain due to RA  7. F/U in 2months with Fidela and 4 months with me- chronic pain-

## 2024-03-16 NOTE — Progress Notes (Signed)
 Subjective:    Patient ID: Tonya Hall Blood, female    DOB: April 22, 1950, 73 y.o.   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  L hip bursitis and also has SI joint pain more on L- as well as mid thoracic acute on chronic pain.   Here for f/u on chronic pain from back and psoriatic arthritis.    BP 198/98  Doesn't know why-  Seeing PCP tomorrow Missed a couple of pills on Saturday and Sunday and not today! Just took Metoprolol  yesterday. Not in pill boxes this weekend,    Pain-  at first  pain was worse, and then leveled out and just as good.  But overall pain has been a little bit worse lately- esp with cold and rain.   Wasn't hurting as much this Am, didn't take AM pain meds- and now hurting badly so will take meds later.   Takes Valium - wasn't taking that often- now ~ every other day-  Seems to help if takes with Oxy 1x/day or every other day.  Only takes 1/2 Valium  2.5 mg-  takes for muscle spasms-  And Oxycodone - takes together.   On Arava - for a long time- and LFTs were up, but fine right now- maybe coming off tylenol  helped.    A lot of sleepiness and not feeling well - feeling bad.  Sleeping a LOT! Falls asleep in AM and pm for awhile- - falling asleep at 7-8pm sleeps until- wakes up 11pm and goes to bed at midnight-   Has been slightly better in last 2 weeks.    Started 2-3 months ago.   Seeing PCP tomorrow because of it.    Doing gel injections in knees right now- weekly for 3 injections- last one this Thursday- hasn't helped as much this time-      Pain Inventory Average Pain 6 Pain Right Now 5 My pain is intermittent, sharp, burning, stabbing, and aching  In the last 24 hours, has pain interfered with the following? General activity 7 Relation with others  5 Enjoyment of life 7 What TIME of day is your pain at its worst? daytime Sleep (in general) Fair  Pain is worse with: bending, standing, and some activites Pain improves with: rest, heat/ice, pacing activities, and medication Relief from Meds: 5  Family History  Problem Relation Age of Onset   Diabetes Mother    Pancreatitis Mother    Arthritis Mother        psoriatic    Fibromyalgia Mother    Rheumatic fever Father    Diabetes Other        grandparents   Skin cancer Other        grandfather   Hypertension Other        grandparent   Congestive Heart Failure Other        grandfather   Parkinson's disease Other        grandmother   Colon cancer Maternal Uncle    Fibromyalgia Sister    Rheum arthritis Sister    Diabetes Sister    Fibromyalgia Sister    Diabetes Sister    Rheum arthritis Sister    Hypertension Son    Colon polyps Neg Hx    Social History   Socioeconomic History  Marital status: Married    Spouse name: Not on file   Number of children: 2   Years of education: Not on file   Highest education level: Not on file  Occupational History   Occupation: Case Midwife    Employer: [employer]    Comment: Estate Manager/land Agent  Tobacco Use   Smoking status: Never    Passive exposure: Never   Smokeless tobacco: Never  Vaping Use   Vaping status: Never Used  Substance and Sexual Activity   Alcohol  use: No   Drug use: No   Sexual activity: Not Currently    Partners: Male    Birth control/protection: Surgical    Comment: BTL  Other Topics Concern   Not on file  Social History Narrative   ATTENDS [employer]. 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   Social Drivers of Health   Tobacco Use: Low Risk (01/29/2024)   Patient History    Smoking Tobacco Use: Never    Smokeless Tobacco Use: Never    Passive Exposure: Never  Financial Resource Strain: Not on file  Food Insecurity: No Food Insecurity (07/18/2023)   Hunger Vital Sign    Worried About Running Out of  Food in the Last Year: Never true    Ran Out of Food in the Last Year: Never true  Transportation Needs: No Transportation Needs (07/18/2023)   PRAPARE - Administrator, Civil Service (Medical): No    Lack of Transportation (Non-Medical): No  Physical Activity: Not on file  Stress: Not on file  Social Connections: Socially Integrated (07/18/2023)   Social Connection and Isolation Panel    Frequency of Communication with Friends and Family: More than three times a week    Frequency of Social Gatherings with Friends and Family: Three times a week    Attends Religious Services: More than 4 times per year    Active Member of Clubs or Organizations: Yes    Attends Banker Meetings: More than 4 times per year    Marital Status: Married  Depression (PHQ2-9): Low Risk (09/23/2023)   Depression (PHQ2-9)    PHQ-2 Score: 0  Alcohol  Screen: Not on file  Housing: Low Risk (07/18/2023)   Housing Stability Vital Sign    Unable to Pay for Housing in the Last Year: No    Number of Times Moved in the Last Year: 0    Homeless in the Last Year: No  Utilities: Not At Risk (07/18/2023)   AHC Utilities    Threatened with loss of utilities: No  Health Literacy: Not on file   Past Surgical History:  Procedure Laterality Date   ANTERIOR CERVICAL DECOMP/DISCECTOMY FUSION N/A 03/09/2020   Procedure: ANTERIOR CERVICAL DECOMPRESSION/DISCECTOMY FUSION, INTERBODY PROSTHESIS, PLATE SCREWS CERVICAL FIVE-SIX, CERVICAL SIX-SEVEN;  Surgeon: Mavis Purchase, MD;  [facility]: [facility] OR;  Service: Neurosurgery;  Laterality: N/A;   BACK SURGERY  2003   BALLOON DILATION N/A 11/12/2022   Procedure: BALLOON DILATION;  Surgeon: Cindie Carlin POUR, DO;  Location[facility];  Service: Endoscopy;  Laterality: N/A;   BIOPSY  04/17/2016   Procedure: BIOPSY;  Surgeon: Margo LITTIE Haddock, MD;  Location: [facility];  Service: Endoscopy;;  random colon bx's   BIOPSY  11/12/2022   Procedure: BIOPSY;  Surgeon:  Cindie Carlin POUR, DO;  Location: [facility];  Service: Endoscopy;;   CERVICAL SPINE SURGERY  09/09/2017   CESAREAN SECTION  1974, 1978       CHOLECYSTECTOMY  04/2023  COLONOSCOPY  06/19/2008   DOQ:dfjoo internal hemorrhoids/mild sigmoin colon diverticulosis   COLONOSCOPY  2012   Dr. Shaaron: normal rectum, diverticula, lymphocytic colitis    COLONOSCOPY WITH PROPOFOL  N/A 04/17/2016   Dr. Harvey: Normal terminal ileum, internal/external hemorrhoids, random colon biopsies consistent with collagenous colitis   DILATION AND CURETTAGE OF UTERUS  1977   spontaneous abortion   ESOPHAGOGASTRODUODENOSCOPY  06/19/2008   DOQ:fnizmjuz gastritis   ESOPHAGOGASTRODUODENOSCOPY (EGD) WITH PROPOFOL  N/A 11/12/2022   Procedure: ESOPHAGOGASTRODUODENOSCOPY (EGD) WITH PROPOFOL ;  Surgeon: Cindie Carlin POUR, DO;  [facility]: [facility];  Service: Endoscopy;  Laterality: N/A;  8:30 am, asa 3   LUMBAR DISC ARTHROPLASTY  09/1996   LUMBAR FUSION  6/03, 11/08, 11/14   L4-5 fusion, L2-3 fusion, L1-2   LUMBAR FUSION  08/03/2020   L4-L5   LUMBAR LAMINECTOMY  03/2020   MOUTH SURGERY  12/07/2021   oral surgery   ODONTOID SCREW INSERTION N/A 01/27/2018   Procedure: ODONTOID SCREW INSERTION;  Surgeon: Mavis Purchase, MD;  Location: [facility] OR;  Service: Neurosurgery;  Laterality: N/A;  ODONTOID SCREW INSERTION   THORACIC DISCECTOMY N/A 07/18/2023   Procedure: THORACIC DISCECTOMY THORACIC ELEVEN-TWELVE DISCECTOMY;  Surgeon: Mavis Purchase, MD;  Location: [facility] OR;  Service: Neurosurgery;  Laterality: N/A;  T11-T   TONSILLECTOMY     TONSILLECTOMY AND ADENOIDECTOMY     TUBAL LIGATION     Past Surgical History:  Procedure Laterality Date   ANTERIOR CERVICAL DECOMP/DISCECTOMY FUSION N/A 03/09/2020   Procedure: ANTERIOR CERVICAL DECOMPRESSION/DISCECTOMY FUSION, INTERBODY PROSTHESIS, PLATE SCREWS CERVICAL FIVE-SIX, CERVICAL SIX-SEVEN;  Surgeon: Mavis Purchase, MD;  Location: [facility] OR;  Service: Neurosurgery;  Laterality: N/A;    BACK SURGERY  2003   BALLOON DILATION N/A 11/12/2022   Procedure: BALLOON DILATION;  Surgeon: Cindie Carlin POUR, DO;  [facility];  Service: Endoscopy;  Laterality: N/A;   BIOPSY  04/17/2016   Procedure: BIOPSY;  Surgeon: Margo LITTIE Harvey, MD;  [facility];  Service: Endoscopy;;  random colon bx's   BIOPSY  11/12/2022   Procedure: BIOPSY;  Surgeon: Cindie Carlin POUR, DO;  [facility];  Service: Endoscopy;;   CERVICAL SPINE SURGERY  09/09/2017   CESAREAN SECTION  1974, 1978       CHOLECYSTECTOMY  04/2023   COLONOSCOPY  06/19/2008   DOQ:dfjoo internal hemorrhoids/mild sigmoin colon diverticulosis   COLONOSCOPY  2012   Dr. Shaaron: normal rectum, diverticula, lymphocytic colitis    COLONOSCOPY WITH PROPOFOL  N/A 04/17/2016   Dr. Harvey: Normal terminal ileum, internal/external hemorrhoids, random colon biopsies consistent with collagenous colitis   DILATION AND CURETTAGE OF UTERUS  1977   spontaneous abortion   ESOPHAGOGASTRODUODENOSCOPY  06/19/2008   DOQ:fnizmjuz gastritis   ESOPHAGOGASTRODUODENOSCOPY (EGD) WITH PROPOFOL  N/A 11/12/2022   Procedure: ESOPHAGOGASTRODUODENOSCOPY (EGD) WITH PROPOFOL ;  Surgeon: Cindie Carlin POUR, DO;  [facility];  Service: Endoscopy;  Laterality: N/A;  8:30 am, asa 3   LUMBAR DISC ARTHROPLASTY  09/1996   LUMBAR FUSION  6/03, 11/08, 11/14   L4-5 fusion, L2-3 fusion, L1-2   LUMBAR FUSION  08/03/2020   L4-L5   LUMBAR LAMINECTOMY  03/2020   MOUTH SURGERY  12/07/2021   oral surgery   ODONTOID SCREW INSERTION N/A 01/27/2018   Procedure: ODONTOID SCREW INSERTION;  Surgeon: Mavis Purchase, MD;  Location: [facility] OR;  Service: Neurosurgery;  Laterality: N/A;  ODONTOID SCREW INSERTION   THORACIC DISCECTOMY N/A 07/18/2023   Procedure: THORACIC DISCECTOMY THORACIC ELEVEN-TWELVE DISCECTOMY;  Surgeon: Mavis Purchase, MD;  Location: [facility] OR;  Service: Neurosurgery;  Laterality: N/A;  T11-T   TONSILLECTOMY     TONSILLECTOMY AND  ADENOIDECTOMY     TUBAL LIGATION     Past Medical History:  Diagnosis Date   Asthma    Albuterol  in haler prn   Cataract    immature unsure which eye   Chronic back pain    COVID-19 October/November 2020   Degenerative disk disease    psoriatic   Diabetes mellitus without complication (HCC)    diet controlled   Elevated liver enzymes    r/t leflunamide (this medication is put on hold intermittently due to this)   Esophageal motility disorder    Non-specific, see modified barium study/speech path, BP   GERD (gastroesophageal reflux disease)    takes Protonix  bid   History of blood transfusion    post c-section   History of bronchitis    History of hiatal hernia    HTN (hypertension)    Hyperlipidemia    Lymphocytic colitis 05/26/2010   Responded to Entocort x 3 MOS   Neuropathy    Nonallergic rhinitis    Osteoporosis    gets Prolia  every 6 months   Peripheral edema    Pneumonia 02/2019   Psoriatic arthritis (HCC)    Dr. Elin   Psoriatic arthritis [facility])    bilateral hands   Raynaud's disease    Seasonal allergies    Shingles 2020   SUI (stress urinary incontinence, female)    Urinary urgency    Ht 4' 10 (1.473 m)   Wt 120 lb (54.4 kg)   LMP 02/01/1999   BMI 25.08 kg/m   Opioid Risk Score:   Fall Risk Score:  `1  Depression screen [facility] 2/9     09/23/2023   11:09 AM 07/24/2023    9:30 AM 05/21/2023    9:11 AM 03/20/2023    9:50 AM 02/20/2023    1:05 PM 12/13/2022   10:04 AM 09/19/2022   10:21 AM  Depression screen PHQ 2/9  Decreased Interest 0 0 0 0 0 0 0  Down, Depressed, Hopeless 0 0 0 0 0 0 0  PHQ - 2 Score 0 0 0 0 0 0 0    Review of Systems  Musculoskeletal:        Pain in toes,fingers, right shoulder,   All other systems reviewed and are negative.      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  L hip bursitis and also has SI joint pain more on L- as well as mid thoracic acute on chronic pain.   Here for f/u on chronic pain from back and psoriatic arthritis.     Go home and take today's meds- and don't miss them again today.  2. No symptoms of stroke- but be careful- and if any symptoms, call 911.  TIME + BRAIN!!!    3.  Suggest to have PCP check since so sleepy  all the time- Thyroid  labs, , vit D levels, no nerve pain or Numbness/tingling more than normal- no hair loss-  so don't see copper or folate level to be checked. Also check for Lyme's disease.    4.   Did oral drug screen today- is due per clinic policy.    5. Con't Valium  as needed for resistant nausea and vomiting and muscle spasms-  Sent in refills 20 pills 5 refills  6. Con't Oxycodone  5 mg- #90- for pain- G89.2- chornic pain due to RA  7. F/U in 2months with Fidela and 4 months with me- chronic pain-    I spent a total of    minutes on total care today- >50% coordination of care- due to

## 2024-03-19 ENCOUNTER — Ambulatory Visit: Admitting: Physician Assistant

## 2024-03-19 DIAGNOSIS — M170 Bilateral primary osteoarthritis of knee: Secondary | ICD-10-CM

## 2024-03-19 LAB — DRUG TOX ALC METAB W/CON, ORAL FLD: Alcohol Metabolite: NEGATIVE ng/mL (ref <25)

## 2024-03-19 LAB — DRUG TOX MONITOR 1 W/CONF, ORAL FLD

## 2024-03-19 MED ORDER — LIDOCAINE HCL 1 % IJ SOLN
1.5000 mL | INTRAMUSCULAR | Status: AC | PRN
  Administered 2024-03-19: 08:00:00 1.5 mL

## 2024-03-19 MED ORDER — HYALURONAN 30 MG/2ML IX SOSY
30.0000 mg | PREFILLED_SYRINGE | INTRA_ARTICULAR | Status: AC | PRN
  Administered 2024-03-19: 08:00:00 30 mg via INTRA_ARTICULAR

## 2024-03-19 NOTE — Progress Notes (Signed)
° °  Procedure Note  Patient: Tonya Hall             Date of Birth: 09/10/1950           MRN: [id number]             Visit Date: 03/19/2024  Procedures: Visit Diagnoses:  1. Primary osteoarthritis of both knees     #3 Orthovisc Bilateral Knees B/B  Large Joint Inj: bilateral knee on 03/19/2024 7:54 AM Indications: pain Details: 25 G 1.5 in needle, medial approach  Arthrogram: No  Medications (Right): 1.5 mL lidocaine  1 %; 30 mg Hyaluronan 30 MG/2ML Aspirate (Right): 0 mL Medications (Left): 1.5 mL lidocaine  1 %; 30 mg Hyaluronan 30 MG/2ML Aspirate (Left): 0 mL Outcome: tolerated well, no immediate complications Procedure, treatment alternatives, risks and benefits explained, specific risks discussed. Consent was given by the patient.       Patient tolerated the procedures well.  Aftercare was discussed.  Waddell Craze, PA-C

## 2024-04-02 ENCOUNTER — Encounter: Payer: Self-pay | Admitting: Gastroenterology

## 2024-04-05 ENCOUNTER — Other Ambulatory Visit: Payer: Self-pay | Admitting: Gastroenterology

## 2024-04-13 ENCOUNTER — Other Ambulatory Visit: Payer: Self-pay | Admitting: Physician Assistant

## 2024-04-13 NOTE — Telephone Encounter (Signed)
 Last Fill: 01/13/2024  Next Visit: 07/02/2024  Last Visit: 01/29/2024  Dx: Psoriatic arthritis   Current Dose per office note on 01/29/2024: prednisone  5 mg 1 tablet daily.   Okay to refill Prednisone ?

## 2024-04-28 ENCOUNTER — Telehealth: Payer: Self-pay

## 2024-04-28 DIAGNOSIS — E119 Type 2 diabetes mellitus without complications: Secondary | ICD-10-CM

## 2024-04-28 DIAGNOSIS — I10 Essential (primary) hypertension: Secondary | ICD-10-CM

## 2024-04-28 NOTE — Progress Notes (Signed)
 Complex Care Management Note Care Guide Note  04/28/2024 Name: Tonya Hall MRN: [id number] DOB: 26-Oct-1950   Complex Care Management Outreach Attempts: An unsuccessful telephone outreach was attempted today to offer the patient information about available complex care management services.  Follow Up Plan:  No further outreach attempts will be made at this time. We have been unable to contact the patient to offer or enroll patient in complex care management services.  Encounter Outcome:  Patient Refused  Leotis Rase [facility], [facility] Guide  Direct Dial: [phone]  Fax [phone]

## 2024-05-06 ENCOUNTER — Encounter: Payer: Self-pay | Admitting: Podiatry

## 2024-05-06 ENCOUNTER — Ambulatory Visit: Admitting: Podiatry

## 2024-05-18 ENCOUNTER — Encounter: Admitting: Registered Nurse

## 2024-07-02 ENCOUNTER — Ambulatory Visit: Admitting: Rheumatology

## 2024-07-06 ENCOUNTER — Encounter: Admitting: Physical Medicine and Rehabilitation

## 2024-07-22 ENCOUNTER — Encounter: Admitting: Physical Medicine and Rehabilitation

## 2024-08-11 ENCOUNTER — Ambulatory Visit: Admitting: Podiatry
